# Patient Record
Sex: Female | Born: 1960
Health system: Southern US, Community
[De-identification: ages and names within clinical notes are randomized; demographics above are authoritative.]

## PROBLEM LIST (undated history)

## (undated) DIAGNOSIS — M1990 Unspecified osteoarthritis, unspecified site: Secondary | ICD-10-CM

## (undated) DIAGNOSIS — I10 Essential (primary) hypertension: Secondary | ICD-10-CM

## (undated) DIAGNOSIS — J4489 Other specified chronic obstructive pulmonary disease: Secondary | ICD-10-CM

## (undated) DIAGNOSIS — N3946 Mixed incontinence: Secondary | ICD-10-CM

## (undated) DIAGNOSIS — E781 Pure hyperglyceridemia: Secondary | ICD-10-CM

## (undated) DIAGNOSIS — E119 Type 2 diabetes mellitus without complications: Secondary | ICD-10-CM

## (undated) DIAGNOSIS — J449 Chronic obstructive pulmonary disease, unspecified: Secondary | ICD-10-CM

## (undated) DIAGNOSIS — R0902 Hypoxemia: Secondary | ICD-10-CM

## (undated) DIAGNOSIS — E785 Hyperlipidemia, unspecified: Secondary | ICD-10-CM

## (undated) DIAGNOSIS — T1491XA Suicide attempt, initial encounter: Secondary | ICD-10-CM

## (undated) DIAGNOSIS — F3110 Bipolar disorder, current episode manic without psychotic features, unspecified: Secondary | ICD-10-CM

## (undated) DIAGNOSIS — I2510 Atherosclerotic heart disease of native coronary artery without angina pectoris: Secondary | ICD-10-CM

## (undated) DIAGNOSIS — R9431 Abnormal electrocardiogram [ECG] [EKG]: Secondary | ICD-10-CM

## (undated) DIAGNOSIS — F32A Depression, unspecified: Secondary | ICD-10-CM

## (undated) DIAGNOSIS — G4730 Sleep apnea, unspecified: Secondary | ICD-10-CM

## (undated) DIAGNOSIS — Z8669 Personal history of other diseases of the nervous system and sense organs: Secondary | ICD-10-CM

## (undated) DIAGNOSIS — Z87448 Personal history of other diseases of urinary system: Secondary | ICD-10-CM

## (undated) DIAGNOSIS — I639 Cerebral infarction, unspecified: Secondary | ICD-10-CM

## (undated) DIAGNOSIS — M7989 Other specified soft tissue disorders: Secondary | ICD-10-CM

## (undated) DIAGNOSIS — I252 Old myocardial infarction: Secondary | ICD-10-CM

## (undated) DIAGNOSIS — G459 Transient cerebral ischemic attack, unspecified: Secondary | ICD-10-CM

## (undated) DIAGNOSIS — I5042 Chronic combined systolic (congestive) and diastolic (congestive) heart failure: Secondary | ICD-10-CM

## (undated) DIAGNOSIS — K859 Acute pancreatitis without necrosis or infection, unspecified: Secondary | ICD-10-CM

## (undated) DIAGNOSIS — E669 Obesity, unspecified: Secondary | ICD-10-CM

## (undated) DIAGNOSIS — F419 Anxiety disorder, unspecified: Secondary | ICD-10-CM

## (undated) DIAGNOSIS — M5116 Intervertebral disc disorders with radiculopathy, lumbar region: Secondary | ICD-10-CM

## (undated) DIAGNOSIS — F17200 Nicotine dependence, unspecified, uncomplicated: Secondary | ICD-10-CM

## (undated) DIAGNOSIS — D72829 Elevated white blood cell count, unspecified: Secondary | ICD-10-CM

## (undated) DIAGNOSIS — M25561 Pain in right knee: Secondary | ICD-10-CM

## (undated) HISTORY — DX: Abnormal electrocardiogram (ECG) (EKG): R94.31

## (undated) HISTORY — DX: Old myocardial infarction: I25.2

## (undated) HISTORY — DX: Chronic obstructive pulmonary disease, unspecified: J44.9

## (undated) HISTORY — DX: Pure hyperglyceridemia: E78.1

## (undated) HISTORY — DX: Suicide attempt, initial encounter: T14.91XA

## (undated) HISTORY — PX: TUBAL LIGATION: SHX77

## (undated) HISTORY — DX: Bipolar disorder, current episode manic without psychotic features, unspecified: F31.10

## (undated) HISTORY — DX: Acute pancreatitis without necrosis or infection, unspecified: K85.90

## (undated) HISTORY — DX: Hyperlipidemia, unspecified: E78.5

## (undated) HISTORY — DX: Personal history of other diseases of urinary system: Z87.448

## (undated) HISTORY — DX: Pain in right knee: M25.561

## (undated) HISTORY — DX: Obesity, unspecified: E66.9

## (undated) HISTORY — DX: Depression, unspecified: F32.A

## (undated) HISTORY — DX: Sleep apnea, unspecified: G47.30

## (undated) HISTORY — DX: Mixed incontinence: N39.46

## (undated) HISTORY — DX: Nicotine dependence, unspecified, uncomplicated: F17.200

## (undated) HISTORY — DX: Anxiety disorder, unspecified: F41.9

## (undated) HISTORY — DX: Other specified chronic obstructive pulmonary disease: J44.89

## (undated) HISTORY — DX: Type 2 diabetes mellitus without complications: E11.9

## (undated) HISTORY — DX: Intervertebral disc disorders with radiculopathy, lumbar region: M51.16

## (undated) HISTORY — DX: Other specified soft tissue disorders: M79.89

## (undated) HISTORY — PX: CHOLECYSTECTOMY: SHX55

## (undated) HISTORY — PX: JOINT REPLACEMENT: SHX530

## (undated) HISTORY — DX: Unspecified osteoarthritis, unspecified site: M19.90

## (undated) HISTORY — DX: Hypoxemia: R09.02

## (undated) HISTORY — DX: Elevated white blood cell count, unspecified: D72.829

## (undated) HISTORY — PX: OTHER SURGICAL HISTORY: SHX169

## (undated) HISTORY — DX: Atherosclerotic heart disease of native coronary artery without angina pectoris: I25.10

## (undated) HISTORY — DX: Personal history of other diseases of the nervous system and sense organs: Z86.69

## (undated) HISTORY — DX: Cerebral infarction, unspecified: I63.9

## (undated) HISTORY — DX: Essential (primary) hypertension: I10

## (undated) HISTORY — DX: Chronic combined systolic (congestive) and diastolic (congestive) heart failure: I50.42

## (undated) HISTORY — DX: Transient cerebral ischemic attack, unspecified: G45.9

## (undated) SURGERY — Surgical Case
Anesthesia: *Unknown

---

## 1982-07-02 HISTORY — PX: CHOLECYSTECTOMY: SHX55

## 1988-06-01 DIAGNOSIS — Z8719 Personal history of other diseases of the digestive system: Secondary | ICD-10-CM

## 1988-06-01 HISTORY — DX: Personal history of other diseases of the digestive system: Z87.19

## 1998-02-28 ENCOUNTER — Other Ambulatory Visit: Admission: RE | Admit: 1998-02-28 | Discharge: 1998-02-28 | Payer: Self-pay | Admitting: Obstetrics and Gynecology

## 2000-03-21 ENCOUNTER — Emergency Department (HOSPITAL_COMMUNITY): Admission: EM | Admit: 2000-03-21 | Discharge: 2000-03-22 | Payer: Self-pay | Admitting: Emergency Medicine

## 2000-06-09 ENCOUNTER — Encounter: Payer: Self-pay | Admitting: Obstetrics and Gynecology

## 2000-06-09 ENCOUNTER — Encounter: Admission: RE | Admit: 2000-06-09 | Discharge: 2000-06-09 | Payer: Self-pay | Admitting: *Deleted

## 2000-06-09 LAB — HM MAMMOGRAPHY: HM Mammogram: NORMAL

## 2000-09-07 ENCOUNTER — Other Ambulatory Visit: Admission: RE | Admit: 2000-09-07 | Discharge: 2000-09-07 | Payer: Self-pay | Admitting: Family Medicine

## 2000-09-07 ENCOUNTER — Encounter: Payer: Self-pay | Admitting: Family Medicine

## 2000-09-07 LAB — CONVERTED CEMR LAB: Pap Smear: NORMAL

## 2001-06-01 ENCOUNTER — Emergency Department (HOSPITAL_COMMUNITY): Admission: EM | Admit: 2001-06-01 | Discharge: 2001-06-01 | Payer: Self-pay | Admitting: Emergency Medicine

## 2001-06-01 DIAGNOSIS — T1491XA Suicide attempt, initial encounter: Secondary | ICD-10-CM

## 2001-06-01 HISTORY — DX: Suicide attempt, initial encounter: T14.91XA

## 2004-04-01 ENCOUNTER — Ambulatory Visit: Payer: Self-pay | Admitting: Internal Medicine

## 2004-08-29 ENCOUNTER — Ambulatory Visit: Payer: Self-pay | Admitting: Family Medicine

## 2004-11-05 ENCOUNTER — Ambulatory Visit: Payer: Self-pay | Admitting: Family Medicine

## 2005-06-04 ENCOUNTER — Ambulatory Visit: Payer: Self-pay | Admitting: Family Medicine

## 2005-06-06 ENCOUNTER — Ambulatory Visit: Payer: Self-pay | Admitting: Family Medicine

## 2005-06-08 ENCOUNTER — Ambulatory Visit: Payer: Self-pay | Admitting: Family Medicine

## 2005-06-12 ENCOUNTER — Ambulatory Visit: Payer: Self-pay | Admitting: Family Medicine

## 2006-01-11 ENCOUNTER — Encounter: Payer: Self-pay | Admitting: Family Medicine

## 2006-01-11 ENCOUNTER — Emergency Department (HOSPITAL_COMMUNITY): Admission: EM | Admit: 2006-01-11 | Discharge: 2006-01-11 | Payer: Self-pay | Admitting: Emergency Medicine

## 2006-01-13 ENCOUNTER — Ambulatory Visit: Payer: Self-pay | Admitting: Family Medicine

## 2006-01-14 ENCOUNTER — Inpatient Hospital Stay (HOSPITAL_COMMUNITY): Admission: AD | Admit: 2006-01-14 | Discharge: 2006-01-15 | Payer: Self-pay | Admitting: Internal Medicine

## 2006-01-14 ENCOUNTER — Ambulatory Visit: Payer: Self-pay | Admitting: Internal Medicine

## 2006-01-15 HISTORY — PX: OTHER SURGICAL HISTORY: SHX169

## 2006-01-19 ENCOUNTER — Ambulatory Visit: Payer: Self-pay | Admitting: Gastroenterology

## 2006-01-26 ENCOUNTER — Ambulatory Visit: Payer: Self-pay | Admitting: Internal Medicine

## 2006-02-22 ENCOUNTER — Ambulatory Visit: Payer: Self-pay | Admitting: Family Medicine

## 2006-02-22 ENCOUNTER — Encounter: Admission: RE | Admit: 2006-02-22 | Discharge: 2006-02-22 | Payer: Self-pay | Admitting: Family Medicine

## 2006-02-27 ENCOUNTER — Encounter: Admission: RE | Admit: 2006-02-27 | Discharge: 2006-02-27 | Payer: Self-pay | Admitting: Family Medicine

## 2006-03-08 ENCOUNTER — Ambulatory Visit: Payer: Self-pay | Admitting: Family Medicine

## 2006-03-15 ENCOUNTER — Encounter: Admission: RE | Admit: 2006-03-15 | Discharge: 2006-04-06 | Payer: Self-pay | Admitting: Neurosurgery

## 2006-06-08 ENCOUNTER — Ambulatory Visit: Payer: Self-pay | Admitting: Family Medicine

## 2006-06-10 ENCOUNTER — Ambulatory Visit: Payer: Self-pay | Admitting: Family Medicine

## 2006-06-11 ENCOUNTER — Inpatient Hospital Stay (HOSPITAL_COMMUNITY): Admission: AD | Admit: 2006-06-11 | Discharge: 2006-06-18 | Payer: Self-pay | Admitting: Internal Medicine

## 2006-06-11 ENCOUNTER — Ambulatory Visit: Payer: Self-pay | Admitting: Internal Medicine

## 2006-06-11 ENCOUNTER — Ambulatory Visit: Payer: Self-pay | Admitting: Family Medicine

## 2006-06-11 ENCOUNTER — Ambulatory Visit: Payer: Self-pay | Admitting: Pulmonary Disease

## 2006-06-14 HISTORY — PX: OTHER SURGICAL HISTORY: SHX169

## 2006-06-14 HISTORY — PX: CT MAXILLOFACIAL WO/W CM: HXRAD844

## 2006-06-16 ENCOUNTER — Encounter: Payer: Self-pay | Admitting: Family Medicine

## 2006-06-23 ENCOUNTER — Ambulatory Visit: Payer: Self-pay | Admitting: Family Medicine

## 2006-07-29 ENCOUNTER — Ambulatory Visit: Payer: Self-pay | Admitting: Critical Care Medicine

## 2006-09-08 ENCOUNTER — Ambulatory Visit: Payer: Self-pay | Admitting: Critical Care Medicine

## 2007-04-08 ENCOUNTER — Encounter: Payer: Self-pay | Admitting: Family Medicine

## 2007-05-04 ENCOUNTER — Ambulatory Visit: Payer: Self-pay | Admitting: Family Medicine

## 2007-05-04 DIAGNOSIS — M7989 Other specified soft tissue disorders: Secondary | ICD-10-CM | POA: Insufficient documentation

## 2007-05-04 DIAGNOSIS — M25569 Pain in unspecified knee: Secondary | ICD-10-CM | POA: Insufficient documentation

## 2007-06-01 ENCOUNTER — Encounter: Payer: Self-pay | Admitting: Family Medicine

## 2007-06-01 DIAGNOSIS — Z6841 Body Mass Index (BMI) 40.0 and over, adult: Secondary | ICD-10-CM | POA: Insufficient documentation

## 2007-06-01 DIAGNOSIS — J45909 Unspecified asthma, uncomplicated: Secondary | ICD-10-CM | POA: Insufficient documentation

## 2007-06-01 DIAGNOSIS — Z87891 Personal history of nicotine dependence: Secondary | ICD-10-CM | POA: Insufficient documentation

## 2007-06-01 DIAGNOSIS — F319 Bipolar disorder, unspecified: Secondary | ICD-10-CM | POA: Insufficient documentation

## 2007-06-01 DIAGNOSIS — F17210 Nicotine dependence, cigarettes, uncomplicated: Secondary | ICD-10-CM | POA: Insufficient documentation

## 2007-06-01 DIAGNOSIS — F4322 Adjustment disorder with anxiety: Secondary | ICD-10-CM

## 2007-06-01 DIAGNOSIS — E781 Pure hyperglyceridemia: Secondary | ICD-10-CM | POA: Insufficient documentation

## 2007-06-01 DIAGNOSIS — K859 Acute pancreatitis without necrosis or infection, unspecified: Secondary | ICD-10-CM | POA: Insufficient documentation

## 2007-06-01 DIAGNOSIS — F39 Unspecified mood [affective] disorder: Secondary | ICD-10-CM | POA: Insufficient documentation

## 2007-06-23 ENCOUNTER — Ambulatory Visit: Payer: Self-pay | Admitting: Family Medicine

## 2007-06-23 DIAGNOSIS — I10 Essential (primary) hypertension: Secondary | ICD-10-CM | POA: Insufficient documentation

## 2007-06-23 LAB — CONVERTED CEMR LAB
Bilirubin Urine: NEGATIVE
Blood in Urine, dipstick: NEGATIVE
Glucose, Urine, Semiquant: NEGATIVE
Ketones, urine, test strip: NEGATIVE
Protein, U semiquant: NEGATIVE
Specific Gravity, Urine: 1.015
Urobilinogen, UA: 0.2
pH: 6

## 2007-06-24 ENCOUNTER — Encounter: Payer: Self-pay | Admitting: Family Medicine

## 2007-07-08 ENCOUNTER — Encounter: Payer: Self-pay | Admitting: Family Medicine

## 2007-07-26 ENCOUNTER — Ambulatory Visit: Payer: Self-pay | Admitting: Family Medicine

## 2008-01-06 ENCOUNTER — Encounter: Payer: Self-pay | Admitting: Family Medicine

## 2008-02-08 ENCOUNTER — Encounter: Payer: Self-pay | Admitting: Family Medicine

## 2008-02-21 ENCOUNTER — Encounter: Payer: Self-pay | Admitting: Family Medicine

## 2008-02-21 ENCOUNTER — Ambulatory Visit (HOSPITAL_COMMUNITY): Admission: RE | Admit: 2008-02-21 | Discharge: 2008-02-21 | Payer: Self-pay | Admitting: Orthopedic Surgery

## 2008-02-22 ENCOUNTER — Ambulatory Visit: Payer: Self-pay | Admitting: Family Medicine

## 2008-02-22 DIAGNOSIS — D72829 Elevated white blood cell count, unspecified: Secondary | ICD-10-CM | POA: Insufficient documentation

## 2008-02-22 DIAGNOSIS — R9431 Abnormal electrocardiogram [ECG] [EKG]: Secondary | ICD-10-CM | POA: Insufficient documentation

## 2008-02-22 LAB — CONVERTED CEMR LAB
Bacteria, UA: 0
Bilirubin Urine: NEGATIVE
Blood in Urine, dipstick: NEGATIVE
Glucose, Urine, Semiquant: NEGATIVE
Ketones, urine, test strip: NEGATIVE
Nitrite: NEGATIVE
Protein, U semiquant: NEGATIVE
RBC / HPF: 0
Specific Gravity, Urine: 1.01
Urobilinogen, UA: 0.2
WBC Urine, dipstick: NEGATIVE
pH: 6

## 2008-02-23 ENCOUNTER — Encounter: Payer: Self-pay | Admitting: Family Medicine

## 2008-02-27 ENCOUNTER — Telehealth: Payer: Self-pay | Admitting: Family Medicine

## 2008-02-27 ENCOUNTER — Ambulatory Visit: Payer: Self-pay | Admitting: Critical Care Medicine

## 2008-02-27 DIAGNOSIS — J45909 Unspecified asthma, uncomplicated: Secondary | ICD-10-CM | POA: Insufficient documentation

## 2008-02-27 LAB — CONVERTED CEMR LAB
Basophils Absolute: 0.1 10*3/uL (ref 0.0–0.1)
Basophils Relative: 0 % (ref 0–1)
Eosinophils Absolute: 0.3 10*3/uL (ref 0.0–0.7)
Eosinophils Relative: 2 % (ref 0–5)
HCT: 43.6 % (ref 36.0–46.0)
Hemoglobin: 14.5 g/dL (ref 12.0–15.0)
Lymphocytes Relative: 22 % (ref 12–46)
Lymphs Abs: 3.6 10*3/uL (ref 0.7–4.0)
MCHC: 33.3 g/dL (ref 30.0–36.0)
MCV: 93.6 fL (ref 78.0–100.0)
Monocytes Absolute: 1.1 10*3/uL — ABNORMAL HIGH (ref 0.1–1.0)
Monocytes Relative: 7 % (ref 3–12)
Neutro Abs: 11 10*3/uL — ABNORMAL HIGH (ref 1.7–7.7)
Neutrophils Relative %: 69 % (ref 43–77)
Platelets: 315 10*3/uL (ref 150–400)
RBC: 4.66 M/uL (ref 3.87–5.11)
RDW: 13.3 % (ref 11.5–15.5)
WBC: 16 10*3/uL — ABNORMAL HIGH (ref 4.0–10.5)

## 2008-02-28 ENCOUNTER — Ambulatory Visit: Payer: Self-pay | Admitting: Family Medicine

## 2008-02-28 DIAGNOSIS — K047 Periapical abscess without sinus: Secondary | ICD-10-CM | POA: Insufficient documentation

## 2008-03-19 ENCOUNTER — Telehealth (INDEPENDENT_AMBULATORY_CARE_PROVIDER_SITE_OTHER): Payer: Self-pay | Admitting: *Deleted

## 2008-03-22 ENCOUNTER — Ambulatory Visit: Payer: Self-pay | Admitting: Internal Medicine

## 2008-03-22 ENCOUNTER — Inpatient Hospital Stay (HOSPITAL_COMMUNITY): Admission: RE | Admit: 2008-03-22 | Discharge: 2008-03-27 | Payer: Self-pay | Admitting: Orthopedic Surgery

## 2008-04-23 ENCOUNTER — Encounter: Admission: RE | Admit: 2008-04-23 | Discharge: 2008-05-23 | Payer: Self-pay | Admitting: Orthopedic Surgery

## 2008-07-13 ENCOUNTER — Encounter: Payer: Self-pay | Admitting: Family Medicine

## 2008-07-30 ENCOUNTER — Ambulatory Visit: Payer: Self-pay | Admitting: Family Medicine

## 2008-07-30 ENCOUNTER — Telehealth (INDEPENDENT_AMBULATORY_CARE_PROVIDER_SITE_OTHER): Payer: Self-pay | Admitting: Internal Medicine

## 2008-07-30 DIAGNOSIS — J441 Chronic obstructive pulmonary disease with (acute) exacerbation: Secondary | ICD-10-CM | POA: Insufficient documentation

## 2008-11-30 ENCOUNTER — Encounter: Payer: Self-pay | Admitting: Family Medicine

## 2008-12-19 ENCOUNTER — Telehealth (INDEPENDENT_AMBULATORY_CARE_PROVIDER_SITE_OTHER): Payer: Self-pay | Admitting: Internal Medicine

## 2008-12-27 ENCOUNTER — Ambulatory Visit: Payer: Self-pay | Admitting: Family Medicine

## 2009-02-07 ENCOUNTER — Encounter: Payer: Self-pay | Admitting: Family Medicine

## 2009-03-01 ENCOUNTER — Encounter: Payer: Self-pay | Admitting: Family Medicine

## 2009-05-29 ENCOUNTER — Telehealth: Payer: Self-pay | Admitting: Family Medicine

## 2009-09-09 ENCOUNTER — Ambulatory Visit: Payer: Self-pay | Admitting: Family Medicine

## 2009-10-14 ENCOUNTER — Encounter: Payer: Self-pay | Admitting: Family Medicine

## 2009-10-17 ENCOUNTER — Ambulatory Visit: Payer: Self-pay | Admitting: Family Medicine

## 2010-01-07 ENCOUNTER — Encounter (INDEPENDENT_AMBULATORY_CARE_PROVIDER_SITE_OTHER): Payer: Self-pay | Admitting: *Deleted

## 2010-01-24 ENCOUNTER — Encounter: Payer: Self-pay | Admitting: Family Medicine

## 2010-02-24 ENCOUNTER — Telehealth: Payer: Self-pay | Admitting: Family Medicine

## 2010-02-24 ENCOUNTER — Emergency Department (HOSPITAL_COMMUNITY): Admission: EM | Admit: 2010-02-24 | Discharge: 2010-02-24 | Payer: Self-pay | Admitting: Emergency Medicine

## 2010-03-10 ENCOUNTER — Encounter: Payer: Self-pay | Admitting: Family Medicine

## 2010-03-10 ENCOUNTER — Ambulatory Visit: Payer: Self-pay | Admitting: Internal Medicine

## 2010-03-12 ENCOUNTER — Ambulatory Visit: Payer: Self-pay | Admitting: Family Medicine

## 2010-03-12 ENCOUNTER — Ambulatory Visit: Payer: Self-pay | Admitting: Internal Medicine

## 2010-03-13 ENCOUNTER — Ambulatory Visit: Payer: Self-pay | Admitting: Internal Medicine

## 2010-03-13 ENCOUNTER — Encounter: Payer: Self-pay | Admitting: Family Medicine

## 2010-03-14 ENCOUNTER — Telehealth: Payer: Self-pay | Admitting: Family Medicine

## 2010-05-08 ENCOUNTER — Ambulatory Visit: Payer: Self-pay | Admitting: Internal Medicine

## 2010-06-19 IMAGING — CR DG CHEST 2V
2 series · 2 of 2 positions shown · non-contrast
Comparison: 06/15/2006

CLINICAL DATA: Preoperative respiratory exam.  Unstable right knee.

CHEST - 2 VIEW

[view not recorded (1 of 2)]
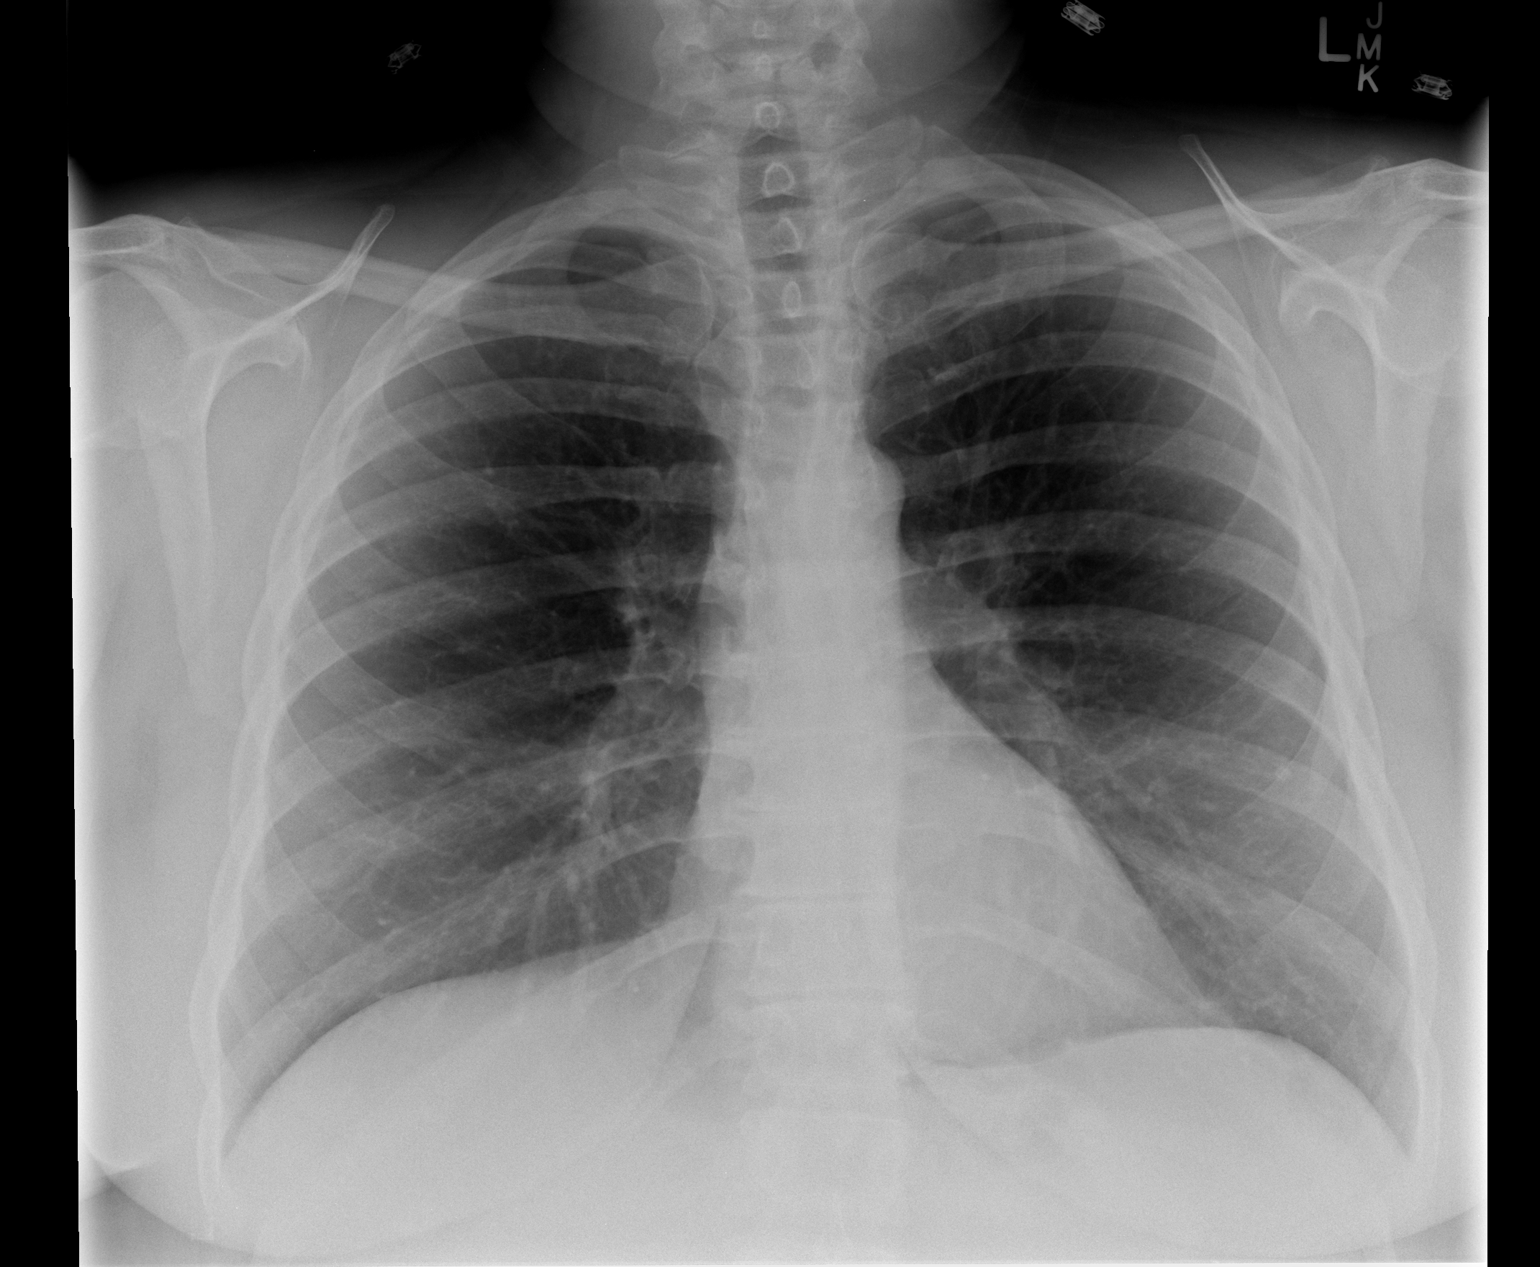

[view not recorded (2 of 2)]
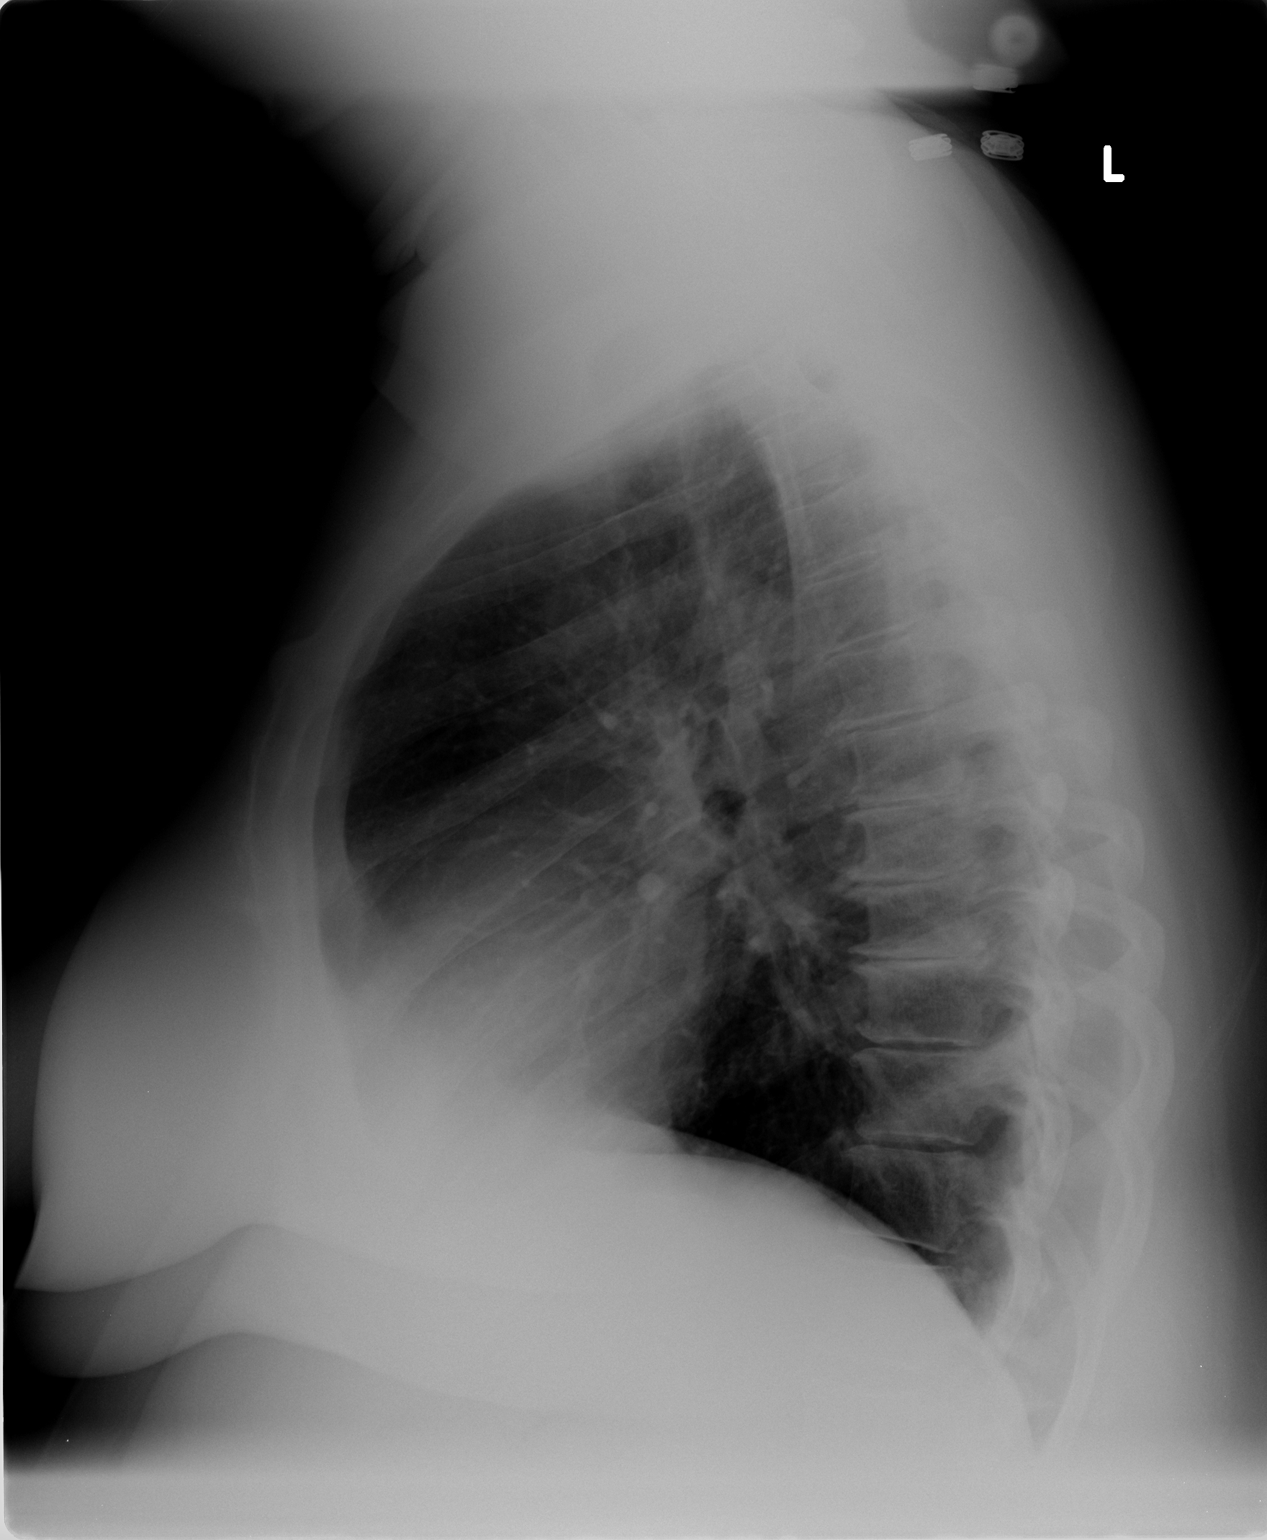

[2 of 2 positions shown; findings below may reference images not displayed]

FINDINGS: The heart size and pulmonary vascularity are normal and
the lungs are clear except for a tiny calcified granuloma at the
left lung base.  No significant bony abnormalities.
IMPRESSION: No acute disease in the chest.

## 2010-06-22 ENCOUNTER — Encounter: Payer: Self-pay | Admitting: Gastroenterology

## 2010-07-01 NOTE — Letter (Signed)
Summary: [facility]  [facility]   Imported By: Lanelle Bal 12/17/2008 11:42:13  _____________________________________________________________________  External Attachment:    Type:   Image     Comment:   External Document

## 2010-07-01 NOTE — Letter (Signed)
Summary: Out of Work  [employer] [facility] at [facility]  [address] [place], [place] [address]   Phone: [phone]  Fax: [phone]    March 12, 2010   Employee:  BRYNNLY BONET    To Whom It May Concern:   For Medical reasons, please excuse the above named employee from work for the following dates:  Start:   03/12/10  End:   03/12/10  If you need additional information, please feel free to contact our office.         Sincerely,    Lewanda Rife LPN

## 2010-07-01 NOTE — Assessment & Plan Note (Signed)
Summary: WHEEZING,S.O.B./CLE   Vital Signs:  Patient profile:   50 year old female Weight:      233.50 pounds O2 Sat:      97 % on Room air Temp:     98.2 degrees F oral Pulse rate:   76 / minute Pulse rhythm:   regular Resp:     20 per minute BP sitting:   112 / 74  (left arm) Cuff size:   large  Vitals Entered By: Sydell Axon LPN (September 09, 2009 10:55 AM)  O2 Flow:  Room air CC: Productive cough/ clear-yellow, some wheezing and SOB   History of Present Illness: Pt here for wheezing with ripping, burning pain in her side when she coughs...she hurts in the left upper Latissimus Dorsi area. It hurts when coughing, doubled her over Sat AM. She has not had feveror chills, has headache now in the porsterior portion of the neck, no ear pain, occas pop with coughing, she feels facially full, some rhinitis that is clear, no ST, no N/V, cough productive clear sputum that is copious, is wheezing and SOB, can get air into her lings but can't get it out. She has to use her stomach muscles to expire her air. She is still smoking and is predictably out of most of her medications. Ids using Proair, no antihistamines and not using symbicort.   Problems Prior to Update: 1)  Chronic Obstructive Pulmonary Disease, Acute Exacerbation  (ICD-491.21) 2)  Abscess, Tooth  (ICD-522.5) 3)  Bronchitis, Obstructive Chronic  (ICD-491.20) 4)  Nonspecific Abnormal Electrocardiogram  (ICD-794.31) 5)  Leukocytosis Unspecified  (ICD-288.60) 6)  Uri  (ICD-465.9) 7)  Hypertension, Benign Essential  (ICD-401.1) 8)  Hx of Back Pain (LUMBAR SPINE DZ- [facility])  (ICD-724.5) 9)  Migraines, Hx of  (ICD-V13.8) 10)  Bipolar Affective Disorder, Manic  (ICD-296.40) 11)  Hypertriglyceridemia  (ICD-272.1) 12)  Hx of Pancreatitis  (ICD-577.0) 13)  Emphysema  (ICD-492.8) 14)  Nicotine Addiction  (ICD-305.1) 15)  Reactive Airway Disease  (ICD-493.90) 16)  Hypertension  (ICD-401.9) 17)  Obesity  (ICD-278.00) 18)  Swelling  of Limb  (ICD-729.81) 19)  Knee Pain, Right  (ICD-719.46)  Medications Prior to Update: 1)  Gabapentin 600 Mg Tabs (Gabapentin) .Marland Kitchen.. 1 By Mouth Three Times A Day 2)  Lisinopril 2.5 Mg  Tabs (Lisinopril) .... Oine Tab By Mouth At Night. 3)  Flexeril 10 Mg  Tabs (Cyclobenzaprine Hcl) .Marland Kitchen.. 1 Three Times A Day As Needed / By Mouth 4)  Darvocet-N 100 100-650 Mg Tabs (Propoxyphene N-Apap) .... As Needed 5)  Proair Hfa 108 (90 Base) Mcg/act  Aers (Albuterol Sulfate) .... 2 Puffs Every  4 Hrs As Needed Wheezing 6)  Diclofenac Sodium 75 Mg  Tbec (Diclofenac Sodium) .Marland Kitchen.. 1 Two Times A Day // As Needed By Mouth 7)  Xopenex 0.63 Mg/32ml  Nebu (Levalbuterol Hcl) .... Use in Nebulizer As Directed As Needed Qid Max 8)  Hydrochlorothiazide 25 Mg  Tabs (Hydrochlorothiazide) .Marland Kitchen.. 1 Daily By Mouth For Swelling 9)  Zithromax Z-Pak 250 Mg Tabs (Azithromycin) .... As Dir 10)  Symbicort 80-4.5 Mcg/act Aero (Budesonide-Formoterol Fumarate) .... One Inh Two Times A Day  Allergies: 1)  ! * Zaroxyline 2)  ! Elio Forget  Physical Exam  General:  Well-developed,well-nourished; alert,appropriate and cooperative throughout examination, congested and audibly wheezing Head:  Normocephalic and atraumatic without obvious abnormalities. No apparent alopecia or balding. Sinuses tender maxillaries. Eyes:  Conjunctiva clear bilaterally. Allergic shiners. Ears:  clear fluid B TMs, dull LR of  TMs. Nose:  External nasal examination shows no deformity or inflammation. Nasal mucosa are pink and moist without lesions or exudates. Mouth:  Oral mucosa and oropharynx without lesions or exudates.  Teeth in good repair. Thick clear PND. Neck:  No deformities, masses, or tenderness noted. Lungs:  normal respiratory effort, no intercostal retractions, and no accessory muscle use.  Diffuse expiratory wheeze and prolongued exp phase. Hesitaes to take deep breath, splints left lat chestwall with cough due to knife-like pain. Heart:  Normal  rate and regular rhythm. S1 and S2 normal without gallop, murmur, click, rub or other extra sounds.   Impression & Recommendations:  Problem # 1:  BRONCHITIS- ACUTE (ICD-466.0) Assessment New  Recurrent. See instructions. Take Zithromax in addition. The following medications were removed from the medication list:    Xopenex 0.63 Mg/20ml Nebu (Levalbuterol hcl) ..... Use in nebulizer as directed as needed qid max    Zithromax Z-pak 250 Mg Tabs (Azithromycin) .Marland Kitchen... As dir Her updated medication list for this problem includes:    Proair Hfa 108 (90 Base) Mcg/act Aers (Albuterol sulfate) .Marland Kitchen... 2 puffs every  4 hrs as needed wheezing    Symbicort 80-4.5 Mcg/act Aero (Budesonide-formoterol fumarate) ..... One inh two times a day    Zithromax Z-pak 250 Mg Tabs (Azithromycin) .Marland Kitchen... As dir    Xopenex 0.63 Mg/33ml Nebu (Levalbuterol hcl) ..... Use in nebuliozer qid as needed for restricted breathing.  Take antibiotics and other medications as directed. Encouraged to push clear liquids, get enough rest, and take acetaminophen as needed. To be seen in 5-7 days if no improvement, sooner if worse.  Problem # 2:  CHRONIC OBSTRUCTIVE PULMONARY DISEASE, ACUTE EXACERBATION (ICD-491.21) Assessment: Deteriorated Again really needs to stop smoking. Had quit 4 mos for ortho surgery, then started back when went back to work.  Problem # 3:  CHEST PAIN, NON-CARDIAC, MUSCLE PULL (ICD-786.59) Assessment: New Use heat and ice as discussed. Use Flexeril. Will take time. Splint chest with cough, sneeze.  Problem # 4:  HYPERTENSION, BENIGN ESSENTIAL (ICD-401.1) Assessment: Unchanged Amazingly stable. Her updated medication list for this problem includes:    Lisinopril 2.5 Mg Tabs (Lisinopril) ..... Oine tab by mouth at night.    Hydrochlorothiazide 25 Mg Tabs (Hydrochlorothiazide) .Marland Kitchen... 1 daily by mouth for swelling  BP today: 112/74 Prior BP: 120/80 (12/27/2008)  Complete Medication List: 1)  Gabapentin  600 Mg Tabs (Gabapentin) .Marland Kitchen.. 1 by mouth three times a day 2)  Lisinopril 2.5 Mg Tabs (Lisinopril) .... Oine tab by mouth at night. 3)  Flexeril 10 Mg Tabs (Cyclobenzaprine hcl) .Marland Kitchen.. 1 three times a day as needed / by mouth 4)  Proair Hfa 108 (90 Base) Mcg/act Aers (Albuterol sulfate) .... 2 puffs every  4 hrs as needed wheezing 5)  Diclofenac Sodium 75 Mg Tbec (Diclofenac sodium) .Marland Kitchen.. 1 two times a day // as needed by mouth 6)  Hydrochlorothiazide 25 Mg Tabs (Hydrochlorothiazide) .Marland Kitchen.. 1 daily by mouth for swelling 7)  Symbicort 80-4.5 Mcg/act Aero (Budesonide-formoterol fumarate) .... One inh two times a day 8)  Zithromax Z-pak 250 Mg Tabs (Azithromycin) .... As dir 9)  Xopenex 0.63 Mg/29ml Nebu (Levalbuterol hcl) .... Use in nebuliozer qid as needed for restricted breathing. 10)  Flexeril 10 Mg Tabs (Cyclobenzaprine hcl) .... One tab three times a day as needed muscle spasm.  Patient Instructions: 1)  Take Zyrtec 10mg  daily at night. Get back on Symbicort.  2)  Use Xopenex as needed. 3)  Take Guaifenesin by going to  [facility] or [facility] and getting MUCOUS RELIEF CONGESTENT (400mg ), take 11/2 tabs by mouth AM and NOON. 4)  Drink lots of fluids anytime taking Guaifenesin.  5)  Use Neti pot, then Vicks at night vaseline during day.  6)  Use Flexeril as needed for muscle spasm. 7)  Quit smoking. Prescriptions: FLEXERIL 10 MG TABS (CYCLOBENZAPRINE HCL) one tab three times a day as needed muscle spasm.  #30 x 0   Entered and Authorized by:   Shaune Leeks MD   Signed by:   Shaune Leeks MD on 09/09/2009   Method used:   Electronically to        [facility] [phone]* (retail)       [address]       [place], [place]  [address]       Ph: [phone]       Fax: [phone]   RxID:   [id number] XOPENEX 0.63 MG/3ML NEBU (LEVALBUTEROL HCL) use in nebuliozer qid as needed for restricted breathing.  #1 box x 3   Entered and Authorized by:   Shaune Leeks MD   Signed by:   Shaune Leeks MD on 09/09/2009   Method used:   Electronically to        [facility] [phone]* (retail)       [address]       [place], [place]  [address]       Ph: [phone]       Fax: [phone]   RxID:   [id number] ZITHROMAX Z-PAK 250 MG TABS (AZITHROMYCIN) as dir  #1 pak x 0   Entered and Authorized by:   Shaune Leeks MD   Signed by:   Shaune Leeks MD on 09/09/2009   Method used:   Electronically to        [facility] [phone]* (retail)       [address]       [place], [place]  [address]       Ph: [phone]       Fax: [phone]   RxID:   [id number] SYMBICORT 80-4.5 MCG/ACT AERO (BUDESONIDE-FORMOTEROL FUMARATE) one inh two times a day  #1 MDI x 12   Entered and Authorized by:   Shaune Leeks MD   Signed by:   Shaune Leeks MD on 09/09/2009   Method used:   Electronically to        [facility]  [address] (retail)       [address]       [place], [place]  [address]       Ph: [phone]       Fax: [phone]   RxID:   [id number]   Current Allergies (reviewed today): ! * ZAROXYLINE ! * Elio Forget

## 2010-07-01 NOTE — Assessment & Plan Note (Signed)
Summary: SINUS INFECTION? / LFW   Vital Signs:  Patient Profile:   50 Years Old Female Height:     62 inches Weight:      234.25 pounds Temp:     98.2 degrees F oral Pulse rate:   76 / minute Pulse rhythm:   regular BP sitting:   122 / 80  (left arm) Cuff size:   large  Vitals Entered By: Delilah Shan (July 30, 2008 4:48 PM)                 Chief Complaint:  ? sinus infection.  Acute Visit History:      The patient complains of cough, nasal discharge, sinus problems, and sore throat.  These symptoms began 2 days ago.  She denies fever.  Other comments include: post nasal drip.        The patient notes wheezing and shortness of breath.  The character of the cough is described as nonproductive.        She complains of sinus pressure, ears being blocked, nasal congestion, and purulent drainage.          Current Allergies (reviewed today): ! * ZAROXYLINE  Past Medical History:    Reviewed history from 02/27/2008 and no changes required:       Current Problems:        BRONCHITIS, OBSTRUCTIVE CHRONIC (ICD-491.20)            -FeV1 64%  TLC 105%   DLCO  55% 2008             -FeV1 81% FeF 25-75 43%  2009       NONSPECIFIC ABNORMAL ELECTROCARDIOGRAM (ICD-794.31)       LEUKOCYTOSIS UNSPECIFIED (ICD-288.60)       URI (ICD-465.9)       HYPERTENSION, BENIGN ESSENTIAL (ICD-401.1)       Hx of BACK PAIN (LUMBAR SPINE DZ- DR [facility]) (ICD-724.5)       MIGRAINES, HX OF (ICD-V13.8)       BIPOLAR AFFECTIVE DISORDER, MANIC (ICD-296.40)       HYPERTRIGLYCERIDEMIA (ICD-272.1)       Hx of PANCREATITIS (ICD-577.0)       EMPHYSEMA (ICD-492.8)       NICOTINE ADDICTION (ICD-305.1)       REACTIVE AIRWAY DISEASE (ICD-493.90)       HYPERTENSION (ICD-401.9)       OBESITY (ICD-278.00)       SWELLING OF LIMB (ICD-729.81)       KNEE PAIN, RIGHT (ICD-719.46)          Social History:    Reviewed history from 06/01/2007 and no changes required:       Marital Status: Married       Children: 3       Occupation:     Review of Systems      See HPI   Physical Exam  General:     fatigued appearing female in NAD Head:     no maxiary sinus ttp Ears:     clear fluid B TMs Nose:     External nasal examination shows no deformity or inflammation. Nasal mucosa are pink and moist without lesions or exudates. Mouth:     Oral mucosa and oropharynx without lesions or exudates.  Teeth in good repair. Lungs:     normal respiratory effort, no intercostal retractions, and no accessory muscle use.  Diffuse expiratory wheeze and prolongued exp phase.  Heart:     Normal rate and  regular rhythm. S1 and S2 normal without gallop, murmur, click, rub or other extra sounds.    Impression & Recommendations:  Problem # 1:  CHRONIC OBSTRUCTIVE PULMONARY DISEASE, ACUTE EXACERBATION (ICD-491.21) Given green mucus change with COPD, will cover with antibiotic. Treat with prednisone taper. Use albuterol as needed.Follow up if not improving.  QUIT SMOKING.   Complete Medication List: 1)  Gabapentin 600 Mg Tabs (Gabapentin) .Marland Kitchen.. 1 by mouth three times a day 2)  Lisinopril 2.5 Mg Tabs (Lisinopril) .... Oine tab by mouth at night. 3)  Flexeril 10 Mg Tabs (Cyclobenzaprine hcl) .Marland Kitchen.. 1 three times a day as needed / by mouth 4)  Darvocet-n 100 100-650 Mg Tabs (Propoxyphene n-apap) .... As needed 5)  Proair Hfa 108 (90 Base) Mcg/act Aers (Albuterol sulfate) .... 2 puffs every  4 hrs as needed wheezing 6)  Diclofenac Sodium 75 Mg Tbec (Diclofenac sodium) .Marland Kitchen.. 1 two times a day // as needed by mouth 7)  Xopenex 0.63 Mg/50ml Nebu (Levalbuterol hcl) .... Use in nebulizer as directed as needed qid max 8)  Hydrochlorothiazide 25 Mg Tabs (Hydrochlorothiazide) .Marland Kitchen.. 1 daily by mouth for swelling 9)  Fluticasone Propionate 50 Mcg/act Susp (Fluticasone propionate) .... Two puffs each nostril daily 10)  Azithromycin 250 Mg Tabs (Azithromycin) .... 2 tab by mouth x 1 day, then 1 tab by mouth daily 11)  Prednisone 20 Mg  Tabs (Prednisone) .... 3 tabs by mouth daily x 3 days, then 2 tabs by mouth daily x 2 days then 1 tab by mouth daily x 2 days     Prescriptions: PROAIR HFA 108 (90 BASE) MCG/ACT  AERS (ALBUTEROL SULFATE) 2 puffs every  4 hrs as needed wheezing  #1 x 0   Entered and Authorized by:   Kerby Nora MD   Signed by:   Kerby Nora MD on 07/30/2008   Method used:   Electronically to        [facility] [address] [phone]* (retail)       [address]       [place], [place]  [address]       Ph: [phone]       Fax: [phone]   RxID:   [id number] PREDNISONE 20 MG TABS (PREDNISONE) 3 tabs by mouth daily x 3 days, then 2 tabs by mouth daily x 2 days then 1 tab by mouth daily x 2 days  #15 x 0   Entered and Authorized by:   Kerby Nora MD   Signed by:   Kerby Nora MD on 07/30/2008   Method used:   Electronically to        [facility] [address] [phone]* (retail)       [address]       [place], [place]  [address]       Ph: [phone]       Fax: [phone]   RxID:   [id number] AZITHROMYCIN 250 MG TABS (AZITHROMYCIN) 2 tab by mouth x 1 day, then 1 tab by mouth daily  #6 x 0   Entered and Authorized by:   Kerby Nora MD   Signed by:   Kerby Nora MD on 07/30/2008   Method used:   Electronically to        [facility] [phone]* (retail)       [address]       [place], [place]  [address]       Ph: [phone]       Fax: [phone]  RxID:   [id number]   Current Allergies (reviewed today): ! * ZAROXYLINE Current Medications (including changes made in today's visit):  GABAPENTIN 600 MG TABS (GABAPENTIN) 1 by mouth three times a day LISINOPRIL 2.5 MG  TABS (LISINOPRIL) oine tab by mouth at night. FLEXERIL 10 MG  TABS (CYCLOBENZAPRINE HCL) 1 three times a day as needed / by mouth DARVOCET-N 100 100-650 MG TABS (PROPOXYPHENE N-APAP) as needed PROAIR HFA 108 (90 BASE) MCG/ACT  AERS (ALBUTEROL SULFATE) 2 puffs every  4 hrs as needed wheezing DICLOFENAC SODIUM 75 MG  TBEC  (DICLOFENAC SODIUM) 1 two times a day // as needed by mouth XOPENEX 0.63 MG/3ML  NEBU (LEVALBUTEROL HCL) use in nebulizer as directed as needed qid max HYDROCHLOROTHIAZIDE 25 MG  TABS (HYDROCHLOROTHIAZIDE) 1 DAILY BY MOUTH FOR SWELLING FLUTICASONE PROPIONATE 50 MCG/ACT SUSP (FLUTICASONE PROPIONATE) Two puffs each nostril daily AZITHROMYCIN 250 MG TABS (AZITHROMYCIN) 2 tab by mouth x 1 day, then 1 tab by mouth daily PREDNISONE 20 MG TABS (PREDNISONE) 3 tabs by mouth daily x 3 days, then 2 tabs by mouth daily x 2 days then 1 tab by mouth daily x 2 days

## 2010-07-01 NOTE — Letter (Signed)
Summary: [facility]  [facility]   Imported By: Sherian Rein 02/14/2010 11:46:30  _____________________________________________________________________  External Attachment:    Type:   Image     Comment:   External Document  Appended Document: [facility]     Clinical Lists Changes

## 2010-07-01 NOTE — Assessment & Plan Note (Signed)
Summary: EVALUATE EKG / LFW.   Vital Signs:  Patient Profile:   50 Years Old Female Weight:      231 pounds Temp:     98 degrees F oral Pulse rate:   88 / minute Pulse rhythm:   regular BP sitting:   140 / 90  (left arm) Cuff size:   large  Vitals Entered By: Providence Crosby (February 22, 2008 3:51 PM)                 Chief Complaint:  review ekg and blood work.  History of Present Illness: Pt here for Preop EKG read as prior anterior infarct, age undetermined and white count being elevated.  Pt came in this afternoon to be seen without referral or paperwork. She was tearful and upset due to learning her right knee surgery for tommorrow had been cancelled. She did not know specifics but we were able to have records faxed from Dr Audrie Lia office. The EKG was machine read , the white count had no differential.  The pt denies feeling bad, no focus of known infection and quiescient lung function, "the best it has been for a while."  The crucial question for me is what type of anesthesia she will have. Her pulmonary situation in the past has been exceedingly tenuous at times and bears evaluation if intubation is being contemplated. I will discuss with Dr Lajoyce Corners.    Prior Medications Reviewed Using: Patient Recall  Current Allergies (reviewed today): ! * ZAROXYLINE      Physical Exam  General:     Well-developed,well-nourished,in no acute distress; alert,appropriate and cooperative throughout examination, minimally congested, apparently from crying about knee surgery being cancelled/postponed. Head:     Normocephalic and atraumatic without obvious abnormalities. No apparent alopecia or balding. Eyes:     Conjunctiva clear bilaterally.  Ears:     External ear exam shows no significant lesions or deformities.  Otoscopic examination reveals clear canals, tympanic membranes are intact bilaterally without bulging, retraction, inflammation or discharge. Hearing is grossly normal  bilaterally. Nose:     External nasal examination shows no deformity or inflammation. Nasal mucosa are pink and moist without lesions or exudates. Mouth:     Oral mucosa and oropharynx without lesions or exudates.  Teeth in good repair. Neck:     No deformities, masses, or tenderness noted. Chest Wall:     No deformities, masses, or tenderness noted. Lungs:     Normal respiratory effort, chest expands symmetrically. Lungs are clear to auscultation, no crackles or wheezes heard today. Heart:     Normal rate and regular rhythm. S1 and S2 normal without gallop, murmur, click, rub or other extra sounds. Msk:     no edema.    Impression & Recommendations:  Problem # 1:  LEUKOCYTOSIS UNSPECIFIED (ICD-288.60) Assessment: New Will repeat her CBC and get diff today manually by Path review. Will do urine culture. Orders: T- * Misc. Laboratory test [phone]) Venipuncture [phone]) T-Culture, Urine ([id number]) T-CBC w/Diff [phone]) UA Dipstick W/ Micro (manual) ([id number])   Problem # 2:  NONSPECIFIC ABNORMAL ELECTROCARDIOGRAM (ICD-794.31) Assessment: New EKG today  and especially 06/16/06 from [facility] does not appear to have Q waves nor T wave changes. I feel the EKG in question is an over read.   Problem # 3:  HYPERTENSION, BENIGN ESSENTIAL (ICD-401.1) Assessment: Deteriorated Pt veery agitated today because of pain from her knee and need to postpone surgery .  Will follow. Her updated medication list for this problem  includes:    Lisinopril 2.5 Mg Tabs (Lisinopril) ..... Oine tab by mouth at night.    Hydrochlorothiazide 25 Mg Tabs (Hydrochlorothiazide) .Marland Kitchen... 1 daily by mouth for swelling  BP today: 140/90 Prior BP: 100/64 (07/26/2007)   Problem # 4:  EMPHYSEMA (ICD-492.8) Assessment: Unchanged I feel her abnormals were providence in that I have real concerns about the pt being intubated with knowing her state of respiratory compromise in the past acutely. I worry that intubation  and hospital atmosphere could be enough to kick her into an acute reaction making extubation difficult or significantly compromising recovery. Dr Lajoyce Corners relates that the plan at this point is for field block WITH intubation. Will request Dr Delford Field see the pt for preop eval.  Complete Medication List: 1)  Gabapentin 300 Mg Caps (Gabapentin) .... Take 1 capsule by mouth three times a day 2)  Lisinopril 2.5 Mg Tabs (Lisinopril) .... Oine tab by mouth at night. 3)  Nasonex 50 Mcg/act Susp (Mometasone furoate) .... Use as directed bid 4)  Flexeril 10 Mg Tabs (Cyclobenzaprine hcl) .Marland Kitchen.. 1 three times a day as needed / by mouth 5)  Tramadol Hcl 50 Mg Tabs (Tramadol hcl) .Marland Kitchen.. 1 qid  as needed pain 6)  Proair Hfa 108 (90 Base) Mcg/act Aers (Albuterol sulfate) .... 2 puffs q 4 hrs as needed wheezing 7)  Diclofenac Sodium 75 Mg Tbec (Diclofenac sodium) .Marland Kitchen.. 1 two times a day // as needed by mouth 8)  Zithromax Z-pak 250 Mg Tabs (Azithromycin) .... As directed. 9)  Xopenex 0.63 Mg/52ml Nebu (Levalbuterol hcl) .... Use in nebulizer as directed as needed qid max 10)  Hydrochlorothiazide 25 Mg Tabs (Hydrochlorothiazide) .Marland Kitchen.. 1 daily by mouth for swelling   Patient Instructions: 1)  Refer to Pulm (Dr Delford Field) for preop eval for probable intubation. 2)  Await urine culure. 3)  Send CBC with path review diff.   ] Laboratory Results   Urine Tests  Date/Time Recieved: February 22, 2008 5:00 PM Date/Time Reported: February 22, 2008 5:00 PM  Routine Urinalysis   Color: yellow Appearance: Clear Glucose: negative   (Normal Range: Negative) Bilirubin: negative   (Normal Range: Negative) Ketone: negative   (Normal Range: Negative) Spec. Gravity: 1.010   (Normal Range: 1.003-1.035) Blood: negative   (Normal Range: Negative) pH: 6.0   (Normal Range: 5.0-8.0) Protein: negative   (Normal Range: Negative) Urobilinogen: 0.2   (Normal Range: 0-1) Nitrite: negative   (Normal Range: Negative) Leukocyte Esterace:  negative   (Normal Range: Negative)  Urine Microscopic WBC/hpf: 0-1 RBC/hpf: 0 Bacteria: 0 Epithelial: Very rare

## 2010-07-01 NOTE — Assessment & Plan Note (Signed)
Summary: 1 MONTH FOLLOW UP/[facility]   Vital Signs:  Patient Profile:   50 Years Old Female Weight:      233 pounds O2 Sat:      95 % O2 treatment:    Room Air Temp:     99.1 degrees F oral Pulse rate:   86 / minute Pulse rhythm:   regular Resp:     26 per minute BP sitting:   100 / 64  (left arm) Cuff size:   large  Vitals Entered By: Providence Crosby (July 26, 2007 9:25 AM)                 Chief Complaint:  1 month f/u but patient c/o feeling awful// wheezing coughing up green phlegm.  History of Present Illness: Looks miserable. Is coughing up green mess with drainage down back of throat...husband was sick for two weeks.  Husb doing better.  Having no h/a, does have ear pain, mild rhinitis, most going down throat with PND, has ST and couch as above, some SOB, no N/V. Sleeping poorly, impacted some by back. Has taken nothing for this. Is tolerating new BP med w/o prob and has felt better on it. Urination now normal.    Prior Medications Reviewed Using: List Brought by Patient  Current Allergies (reviewed today): ! * ZAROXYLINE      Physical Exam  General:     Well-developed,well-nourished,in no acute distress; alert,appropriate and cooperative throughout examination, congested and looks miserable. Head:     Normocephalic and atraumatic without obvious abnormalities. No apparent alopecia or balding. Max sinuses tender. Eyes:     Conjunctiva clear bilaterally.  Ears:     Dull LR, nonerythem. Nose:     Inflamed and swollen. Mouth:     Oral mucosa and oropharynx without lesions or exudates.  Teeth in good repair. Thick PND. Neck:     No deformities, masses, or tenderness noted. Chest Wall:     No deformities, masses, or tenderness noted. Lungs:     Ronchi and wheezes throughout. Heart:     Normal rate and regular rhythm. S1 and S2 normal without gallop, murmur, click, rub or other extra sounds.    Impression & Recommendations:  Problem # 1:   BRONCHITIS-ACUTE (ICD-466.0) Assessment: New  The following medications were removed from the medication list:    Cipro 500 Mg Tabs (Ciprofloxacin hcl) ..... One tab by mouth two times a day  Her updated medication list for this problem includes:    Proair Hfa 108 (90 Base) Mcg/act Aers (Albuterol sulfate) .Marland Kitchen... 2 puffs q 4 hrs as needed wheezing    Zithromax Z-pak 250 Mg Tabs (Azithromycin) .Marland Kitchen... As directed.    Xopenex 0.63 Mg/21ml Nebu (Levalbuterol hcl) ..... Use in nebulizer as directed as needed qid max  Take antibiotics and other medications as directed. Encouraged to push clear liquids, get enough rest, and take acetaminophen as needed. To be seen in 5-7 days if no improvement, sooner if worse.   Problem # 2:  URI (ICD-465.9) Assessment: New See instructions. Her updated medication list for this problem includes:    Diclofenac Sodium 75 Mg Tbec (Diclofenac sodium) .Marland Kitchen... 1 two times a day // as needed by mouth Instructed on symptomatic treatment. Call if symptoms persist or worsen.   Problem # 3:  HYPERTENSION, BENIGN ESSENTIAL (ICD-401.1) Assessment: Improved Improived, will see back to reassess when feeling better. The following medications were removed from the medication list:    Hydrochlorothiazide 12.5 Mg Tabs (Hydrochlorothiazide) .Marland KitchenMarland KitchenMarland KitchenMarland Kitchen  Take 1 tablet by mouth once a day  Her updated medication list for this problem includes:    Lisinopril 2.5 Mg Tabs (Lisinopril) ..... Oine tab by mouth at night.  BP today: 100/64 Prior BP: 154/94 (06/23/2007)   Problem # 4:  EMPHYSEMA (ICD-492.8) Assessment: Unchanged Script for Xopenex called in.  Could probably use Spiriva...will discuss next time again.  Complete Medication List: 1)  Gabapentin 300 Mg Caps (Gabapentin) .... Take 1 capsule by mouth three times a day 2)  Lisinopril 2.5 Mg Tabs (Lisinopril) .... Oine tab by mouth at night. 3)  Nasonex 50 Mcg/act Susp (Mometasone furoate) .... Use as directed bid 4)  Flexeril 10  Mg Tabs (Cyclobenzaprine hcl) .Marland Kitchen.. 1 three times a day as needed / by mouth 5)  Tramadol Hcl 50 Mg Tabs (Tramadol hcl) .Marland Kitchen.. 1 qid  as needed pain 6)  Proair Hfa 108 (90 Base) Mcg/act Aers (Albuterol sulfate) .... 2 puffs q 4 hrs as needed wheezing 7)  Diclofenac Sodium 75 Mg Tbec (Diclofenac sodium) .Marland Kitchen.. 1 two times a day // as needed by mouth 8)  Zithromax Z-pak 250 Mg Tabs (Azithromycin) .... As directed. 9)  Xopenex 0.63 Mg/45ml Nebu (Levalbuterol hcl) .... Use in nebulizer as directed as needed qid max   Patient Instructions: 1)  Guaifenesen, pills or liquid, 600mg  by mouth AM and NOON for the next 4-5 days. 2)  Tylenol ES two tabs by mouth four times a day  3)  Push fluids.  4)  GUAIFENESIN  600mg  by mouth AM and NOON 5)   [facility] MUCOUS RELIEF EXPECTORANT (400 mg) 11/2 TABS      6)  Delsym per label at night    7)  Gargle              8)  RTC 3 mos to check BP 9)  Discuss Spiriva, Atrovent for nebulizer.    Prescriptions: XOPENEX 0.63 MG/3ML  NEBU (LEVALBUTEROL HCL) use in nebulizer as directed as needed qid max  #10 x 6   Entered and Authorized by:   Shaune Leeks MD   Signed by:   Shaune Leeks MD on 07/26/2007   Method used:   Electronically sent to ...       [address]       [address]       [place], [place]  [address]       Ph: [phone]       Fax: [phone]   RxID:   [id number] ZITHROMAX Z-PAK 250 MG  TABS (AZITHROMYCIN) as directed.  #1 pak x 0   Entered and Authorized by:   Shaune Leeks MD   Signed by:   Shaune Leeks MD on 07/26/2007   Method used:   Electronically sent to ...       [address]*       [address]       [place], [place]  [address]       Ph: [phone]       Fax: [phone]   RxID:   [id number]  ]  Appended Document: 1 MONTH FOLLOW UP/[facility] Prescriptions: HYDROCHLOROTHIAZIDE 25 MG  TABS (HYDROCHLOROTHIAZIDE) 1 DAILY BY MOUTH FOR SWELLING  #30 x 3   Entered by:   Providence Crosby   Authorized by:   Shaune Leeks MD   Signed by:   Providence Crosby on 08/03/2007   Method used:   Electronically sent to .[facility]  [address]       [address]       [place], [place]  [address]       Ph: [phone]       Fax: [phone]   RxID:   [id number]

## 2010-07-01 NOTE — Consult Note (Signed)
Summary: [facility]/Dr. [facility]Dr. Phoebe Perch   Imported By: Eleonore Chiquito 08/15/2007 16:57:28  _____________________________________________________________________  External Attachment:    Type:   Image     Comment:   External Document

## 2010-07-01 NOTE — Letter (Signed)
Summary: Dr.James Hirsch,[facility],Note  Dr.James Hirsch,[facility],Note   Imported By: Beau Fanny 11/01/2009 14:14:01  _____________________________________________________________________  External Attachment:    Type:   Image     Comment:   External Document

## 2010-07-01 NOTE — Assessment & Plan Note (Signed)
Summary: ? sinus infection   Vital Signs:  Patient Profile:   50 Years Old Female Weight:      230 pounds Temp:     98.2 degrees F rectal Pulse rate:   88 / minute Pulse rhythm:   regular BP sitting:   130 / 80  (left arm) Cuff size:   large  Vitals Entered By: Providence Crosby (February 28, 2008 2:47 PM)                 Chief Complaint:  ? SINUS INFECTION.  History of Present Illness: Pt saw Dr Delford Field yesterday and was deemed ok. She had pus come out of her tooth today. Hasn't seen a dentist in a long time. Has significant pain in the upper left jaw area. No fever or chills. Feels like she has a sinus infection.    Prior Medications Reviewed Using: List Brought by Patient  Current Allergies (reviewed today): ! * ZAROXYLINE      Physical Exam  General:     Well-developed,well-nourished,in no acute distress; alert,appropriate and cooperative throughout examination Head:     Normocephalic and atraumatic without obvious abnormalities. No apparent alopecia or balding. Left Max sinus area tender to palp Eyes:     Conjunctiva clear bilaterally.  Ears:     External ear exam shows no significant lesions or deformities.  Otoscopic examination reveals clear canals, tympanic membranes are intact bilaterally without bulging, inflammation or discharge. Hearing is grossly normal bilaterally. L TM dull, nonerythematous. Nose:     External nasal examination shows no deformity or inflammation. Nasal mucosa are pink and moist without lesions or exudates. Mouth:     Oral mucosa and oropharynx without lesions or exudates.  Teeth in poor  repair. Nubbins of posterior teeth look infcted with pus and necrotic tissue surrounding. Neck:     No deformities, masses, or tenderness noted. Lungs:     Normal respiratory effort, chest expands symmetrically. Lungs are clear to auscultation, no crackles or wheezes. Heart:     Normal rate and regular rhythm. S1 and S2 normal without gallop, murmur,  click, rub or other extra sounds.    Impression & Recommendations:  Problem # 1:  ABSCESS, TOOTH (ICD-522.5) Assessment: New Probably multiple based on appearance of posterior teeth. Start Amoxicillin. See Dentist.  Complete Medication List: 1)  Gabapentin 600 Mg Tabs (Gabapentin) .Marland Kitchen.. 1 by mouth three times a day 2)  Lisinopril 2.5 Mg Tabs (Lisinopril) .... Oine tab by mouth at night. 3)  Flexeril 10 Mg Tabs (Cyclobenzaprine hcl) .Marland Kitchen.. 1 three times a day as needed / by mouth 4)  Darvocet-n 100 100-650 Mg Tabs (Propoxyphene n-apap) .... As needed 5)  Proair Hfa 108 (90 Base) Mcg/act Aers (Albuterol sulfate) .... 2 puffs q 4 hrs as needed wheezing 6)  Diclofenac Sodium 75 Mg Tbec (Diclofenac sodium) .Marland Kitchen.. 1 two times a day // as needed by mouth 7)  Xopenex 0.63 Mg/66ml Nebu (Levalbuterol hcl) .... Use in nebulizer as directed as needed qid max 8)  Hydrochlorothiazide 25 Mg Tabs (Hydrochlorothiazide) .Marland Kitchen.. 1 daily by mouth for swelling 9)  Flovent Hfa 110 Mcg/act Aero (Fluticasone propionate  hfa) .... Two  puffs twice daily 10)  Fluticasone Propionate 50 Mcg/act Susp (Fluticasone propionate) .... Two puffs each nostril daily 11)  Amoxicillin 500 Mg Caps (Amoxicillin) .... 2 tabs by mouth two times a day   Patient Instructions: 1)  See dentist. 2)  Take Mucous relief daily.   Prescriptions: AMOXICILLIN 500 MG CAPS (AMOXICILLIN)  2 tabs by mouth two times a day  #40 x 0   Entered and Authorized by:   Shaune Leeks MD   Signed by:   Shaune Leeks MD on 02/28/2008   Method used:   Electronically to        [facility]* (retail)       [address]       [place], [place]  [address]       Ph: [phone]       Fax: [phone]   RxID:   [id number]  ]

## 2010-07-01 NOTE — Letter (Signed)
Summary: [facility] / BACK AND LEFT LEG PAIN / DR  [facility] / BACK AND LEFT LEG PAIN / DR. Colon Branch   Imported By: Carin Primrose 07/20/2008 11:06:15  _____________________________________________________________________  External Attachment:    Type:   Image     Comment:   External Document

## 2010-07-01 NOTE — Letter (Signed)
Summary: ERROR  [employer] [facility]  [address] [place], [place] [address]   Phone: [phone]  Fax: [phone]    March 13, 2010   Employee:  KARALYNN COTTONE    To Whom It May Concern:   For Medical reasons, please excuse the above named employee from work for the following dates:  Start:  March 13, 2010   End:  March 13, 2010   If you need additional information, please feel free to contact our office.         Sincerely,    [name]  MD

## 2010-07-01 NOTE — Assessment & Plan Note (Signed)
Summary: ? Sinus infection, chest congestion and some wheezing/RL   Vital Signs:  Patient profile:   50 year old female Height:      62 inches Weight:      241 pounds BMI:     44.24 O2 Sat:      95 % on Room air Temp:     98.9 degrees F tympanic Pulse rate:   96 / minute Pulse rhythm:   regular Resp:     26 per minute BP sitting:   120 / 80  (left arm) Cuff size:   large  Vitals Entered By: Providence Crosby LPN (December 27, 2008 11:13 AM)  O2 Flow:  Room air CC: Uri symptoms wheezing   History of Present Illness: Pt doesn't feel good, having trouble breathing...is back smoking. She has fever to "Lowgrade, no higher than 101." She has frontal headache, right ear pain, rhinitis that is greenish yellow all day long, ST from drainage, and cough to the point of getting lightheaded, productive same greenish drainagfe. She also has ST and Wheezing, and has had nausea, no vomiting, some wheezing. She has not taken anything nad has been using Ventolin fairly regularly, so much so theat the pharmacy told her it was too soon to fill it.  Problems Prior to Update: 1)  Chronic Obstructive Pulmonary Disease, Acute Exacerbation  (ICD-491.21) 2)  Abscess, Tooth  (ICD-522.5) 3)  Bronchitis, Obstructive Chronic  (ICD-491.20) 4)  Nonspecific Abnormal Electrocardiogram  (ICD-794.31) 5)  Leukocytosis Unspecified  (ICD-288.60) 6)  Uri  (ICD-465.9) 7)  Hypertension, Benign Essential  (ICD-401.1) 8)  Hx of Back Pain (LUMBAR SPINE DZ- [facility]  (ICD-724.5) 9)  Migraines, Hx of  (ICD-V13.8) 10)  Bipolar Affective Disorder, Manic  (ICD-296.40) 11)  Hypertriglyceridemia  (ICD-272.1) 12)  Hx of Pancreatitis  (ICD-577.0) 13)  Emphysema  (ICD-492.8) 14)  Nicotine Addiction  (ICD-305.1) 15)  Reactive Airway Disease  (ICD-493.90) 16)  Hypertension  (ICD-401.9) 17)  Obesity  (ICD-278.00) 18)  Swelling of Limb  (ICD-729.81) 19)  Knee Pain, Right  (ICD-719.46)  Medications Prior to Update: 1)  Gabapentin  600 Mg Tabs (Gabapentin) .Marland Kitchen.. 1 By Mouth Three Times A Day 2)  Lisinopril 2.5 Mg  Tabs (Lisinopril) .... Oine Tab By Mouth At Night. 3)  Flexeril 10 Mg  Tabs (Cyclobenzaprine Hcl) .Marland Kitchen.. 1 Three Times A Day As Needed / By Mouth 4)  Darvocet-N 100 100-650 Mg Tabs (Propoxyphene N-Apap) .... As Needed 5)  Proair Hfa 108 (90 Base) Mcg/act  Aers (Albuterol Sulfate) .... 2 Puffs Every  4 Hrs As Needed Wheezing 6)  Diclofenac Sodium 75 Mg  Tbec (Diclofenac Sodium) .Marland Kitchen.. 1 Two Times A Day // As Needed By Mouth 7)  Xopenex 0.63 Mg/52ml  Nebu (Levalbuterol Hcl) .... Use in Nebulizer As Directed As Needed Qid Max 8)  Hydrochlorothiazide 25 Mg  Tabs (Hydrochlorothiazide) .Marland Kitchen.. 1 Daily By Mouth For Swelling 9)  Fluticasone Propionate 50 Mcg/act Susp (Fluticasone Propionate) .... Two Puffs Each Nostril Daily 10)  Azithromycin 250 Mg Tabs (Azithromycin) .... 2 Tab By Mouth X 1 Day, Then 1 Tab By Mouth Daily 11)  Prednisone 20 Mg Tabs (Prednisone) .... 3 Tabs By Mouth Daily X 3 Days, Then 2 Tabs By Mouth Daily X 2 Days Then 1 Tab By Mouth Daily X 2 Days  Allergies: 1)  ! * Zaroxyline 2)  ! Elio Forget  Physical Exam  General:  Mildly fatigued appearing female in NAD, quiet and breathing comfortably as long as not moving.  Head:  Normocephalic and atraumatic without obvious abnormalities. No apparent alopecia or balding. Sinuses tender maxillaries. Eyes:  Conjunctiva clear bilaterally.  Ears:  clear fluid B TMs Nose:  External nasal examination shows no deformity or inflammation. Nasal mucosa are pink and moist without lesions or exudates. Mouth:  Oral mucosa and oropharynx without lesions or exudates.  Teeth in good repair. Thick tan PND. Neck:  No deformities, masses, or tenderness noted. Chest Wall:  No deformities, masses, or tenderness noted. Lungs:  normal respiratory effort, no intercostal retractions, and no accessory muscle use.  Diffuse expiratory wheeze and prolongued exp phase.  Heart:  Normal rate  and regular rhythm. S1 and S2 normal without gallop, murmur, click, rub or other extra sounds.   Impression & Recommendations:  Problem # 1:  CHRONIC OBSTRUCTIVE PULMONARY DISEASE, ACUTE EXACERBATION (ICD-491.21) Assessment Deteriorated  RECURRENCE.  Will give Albuterol Neb now to jump strart breating and follow immediately with Symbicort. Start Zithro max right away. See instructions. Dexamethasone now IM.  Orders: Nebulizer Tx (28366)  Problem # 2:  BRONCHITIS, OBSTRUCTIVE CHRONIC (ICD-491.20) Assessment: Unchanged  RECURRENCE See above.  Orders: Dexamethasone Sodium Phosphate 1mg  (J1100) Admin of Therapeutic Inj  intramuscular or subcutaneous (29476)  Problem # 3:  NICOTINE ADDICTION (ICD-305.1) Assessment: Unchanged RECURRENT.Marland KitchenMarland KitchenEncouraged to again quit.  Problem # 4:  HYPERTENSION (ICD-401.9) Assessment: Unchanged  Her updated medication list for this problem includes:    Lisinopril 2.5 Mg Tabs (Lisinopril) ..... Oine tab by mouth at night.    Hydrochlorothiazide 25 Mg Tabs (Hydrochlorothiazide) .Marland Kitchen... 1 daily by mouth for swelling  BP today: 120/80 Prior BP: 122/80 (07/30/2008)  Problem # 5:  OBESITY (ICD-278.00) Assessment: Unchanged Needs to work on this....discussed.  Complete Medication List: 1)  Gabapentin 600 Mg Tabs (Gabapentin) .Marland Kitchen.. 1 by mouth three times a day 2)  Lisinopril 2.5 Mg Tabs (Lisinopril) .... Oine tab by mouth at night. 3)  Flexeril 10 Mg Tabs (Cyclobenzaprine hcl) .Marland Kitchen.. 1 three times a day as needed / by mouth 4)  Darvocet-n 100 100-650 Mg Tabs (Propoxyphene n-apap) .... As needed 5)  Proair Hfa 108 (90 Base) Mcg/act Aers (Albuterol sulfate) .... 2 puffs every  4 hrs as needed wheezing 6)  Diclofenac Sodium 75 Mg Tbec (Diclofenac sodium) .Marland Kitchen.. 1 two times a day // as needed by mouth 7)  Xopenex 0.63 Mg/30ml Nebu (Levalbuterol hcl) .... Use in nebulizer as directed as needed qid max 8)  Hydrochlorothiazide 25 Mg Tabs (Hydrochlorothiazide) .Marland Kitchen.. 1  daily by mouth for swelling 9)  Zithromax Z-pak 250 Mg Tabs (Azithromycin) .... As dir 10)  Symbicort 80-4.5 Mcg/act Aero (Budesonide-formoterol fumarate) .... One inh two times a day  Patient Instructions: 1)  Take Zithromax and repaet next Friday. 2)  Take Symbicort two times a day (given samp) 3)  Take Guaifenesin by [facility] and getting MUCOUS RELIEF EXPECTORANT (400mg ), take 11/2 tabs by mouth AM and NOON. 4)  Drink lots of fluids anytime taking Guaifenesin.  5)  Steroid shot today. 6)  Take Tyl Es 2 three times a day. 7)  QUIT SMOKING AND GET REGULAR EXERCISE 8)  RTC if not improved, to ER if worsens. Prescriptions: SYMBICORT 80-4.5 MCG/ACT AERO (BUDESONIDE-FORMOTEROL FUMARATE) one inh two times a day  #1 inh x 12   Entered and Authorized by:   Shaune Leeks MD   Signed by:   Shaune Leeks MD on 12/27/2008   Method used:   [facility] [facility] [address] [id number]       [address]       [place], [place]  [address]       Ph: [phone]       Fax: [phone]   RxID:   [id number] Rhys Martini 250 MG TABS (AZITHROMYCIN) as dir  #1 x 1   Entered and Authorized by:   Shaune Leeks MD   Signed by:   Shaune Leeks MD on 12/27/2008   Method used:   [facility]        [facility]  [address] [id number]       [address]       [place], [place]  [address]       Ph: [phone]       Fax: [phone]   RxID:   [id number]    Medication Administration  Injection # 1:    Medication: Dexamethasone Sodium Phosphate 1mg     Diagnosis: BRONCHITIS, OBSTRUCTIVE CHRONIC (ICD-491.20)    Route: IM    Site: LUOQ gluteus    Exp Date: 09/2009    Lot #: [id number]    Mfr: baxter    Comments: n.d.c. [phone] 10mg  per ml 1ml im    Patient tolerated injection without complications    Given by: Providence Crosby LPN (December 27, 2008 11:46  AM)  Orders Added: 1)  Est. Patient Level III [99213] 2)  Nebulizer Tx [94640] 3)  Dexamethasone Sodium Phosphate 1mg  [J1100] 4)  Admin of Therapeutic Inj  intramuscular or subcutaneous [09628]

## 2010-07-01 NOTE — Assessment & Plan Note (Signed)
Summary: CHECK BP/CLE   Vital Signs:  Patient Profile:   50 Years Old Female Weight:      237 pounds Temp:     99 degrees F oral Pulse rate:   80 / minute Pulse rhythm:   regular BP sitting:   154 / 94  (left arm) Cuff size:   large  Vitals Entered By: Providence Crosby (June 23, 2007 12:05 PM)                 Chief Complaint:  elevated bp fills funny.  History of Present Illness: hAS HAD bp ELEVATION LATELY ALTHO CHARTREVIEW HAS HAD HER MILDLY ELEVATED FOR SOME TIME...FEELS MILDLY LIGHTHEADED AND"FOGGY" AT PRESENT. hAS TAKEN HCTZ FOR A SHORT WHILE FOR EDEMA, IS DUE TO STOP THAT SOON. ALSO C/O SLIGHT BURNING WITH URINATION AND FEELING LIKE SHE NEEDS TO GO WITHOUT DOING MUCH FOR THE LAST FEW DAYS WHICH MAKES HER FEEL LIKE SHE CONSISTENTLY NEEDS TO GO BACK AGAIN.  Current Allergies (reviewed today): ! * ZAROXYLINE      Physical Exam  General:     Well-developed,well-nourished,in no acute distress; alert,appropriate and cooperative throughout examination, seems congested but denies. Head:     Normocephalic and atraumatic without obvious abnormalities. No apparent alopecia or balding. Sinuses nontender. Eyes:     Conjunctiva clear bilaterally. Mildly erythem and swollen lower eyelids. Ears:     External ear exam shows no significant lesions or deformities.  Otoscopic examination reveals clear canals, tympanic membranes are intact bilaterally without bulging, retraction, inflammation or discharge. Hearing is grossly normal bilaterally. Nose:     External nasal examination shows no deformity or inflammation. Nasal mucosa are pink and moist without lesions or exudates. Mouth:     Oral mucosa and oropharynx without lesions or exudates.  Teeth in good repair. Neck:     No deformities, masses, or tenderness noted. Chest Wall:     No deformities, masses, or tenderness noted. Lungs:     Normal respiratory effort, chest expands symmetrically. Lungs are clear to auscultation, no  crackles or wheezes. Heart:     Normal rate and regular rhythm. S1 and S2 normal without gallop, murmur, click, rub or other extra sounds. Abdomen:     No suprapubic tenderness. Msk:     No CVAT.    Impression & Recommendations:  Problem # 1:  HYPERTENSION, BENIGN ESSENTIAL (ICD-401.1) Assessment: New Start Lisinopril 2.5 mg, stop HCTZ. Her updated medication list for this problem includes:    Lisinopril 2.5 Mg Tabs (Lisinopril) ..... Oine tab by mouth at night.  BP today: 154/94 Prior BP: 146/114 (05/04/2007)   Problem # 2:  DYSURIA (ICD-788.1) Appears to be UTI, will treat with Cipro.  Urine culture sent. The following medications were removed from the medication list:    Avelox 400 Mg Tabs (Moxifloxacin hcl) ..... One by mouth qd  Her updated medication list for this problem includes:    Cipro 500 Mg Tabs (Ciprofloxacin hcl) ..... One tab by mouth two times a day  Orders: UA Dipstick W/ Micro (81[address]) T-Culture, Urine ([address])  Encouraged to push clear liquids, get enough rest, and take acetaminophen as needed. To be seen in 10 days if no improvement, sooner if worse.   Medications Added to Medication List This Visit: 1)  Lisinopril 2.5 Mg Tabs (Lisinopril) .... Oine tab by mouth at night. 2)  Flexeril 10 Mg Tabs (Cyclobenzaprine hcl) .Marland Kitchen.. 1 tablet as needed as needed 3)  Cipro 500 Mg Tabs (Ciprofloxacin hcl) .... One  tab by mouth two times a day  Complete Medication List: 1)  Pro-air Aerosol  .... Inhale 2 puffs by mouth every 4 hrs prn wheezing 2)  Gabapentin 300 Mg Caps (Gabapentin) .... Take 1 capsule by mouth three times a day 3)  Diclofenac 500 Mg  .... Take 1 tablet by mouth two times a day 4)  Lisinopril 2.5 Mg Tabs (Lisinopril) .... Oine tab by mouth at night. 5)  Nasonex 50 Mcg/act Susp (Mometasone furoate) .... Use as directed bid 6)  Flexeril 10 Mg Tabs (Cyclobenzaprine hcl) .Marland Kitchen.. 1 tablet as needed as needed 7)  Cipro 500 Mg Tabs (Ciprofloxacin hcl)  .... One tab by mouth two times a day   Patient Instructions: 1)  RTC one month, sooner if sxs of urine don't improve.    Prescriptions: CIPRO 500 MG  TABS (CIPROFLOXACIN HCL) one tab by mouth two times a day  #14 x 0   Entered and Authorized by:   Shaune Leeks MD   Signed by:   Shaune Leeks MD on 06/23/2007   Method used:   Electronically sent to ...       [address]*       [address]       [place], [place]  [address]       Ph: [phone]       Fax: [phone]   RxID:   [id number] LISINOPRIL 2.5 MG  TABS (LISINOPRIL) oine tab by mouth at night.  #30 x 12   Entered and Authorized by:   Shaune Leeks MD   Signed by:   Shaune Leeks MD on 06/23/2007   Method used:   Electronically sent to ...       [address]       [address]       [place], [place]  [address]       Ph: [phone]       Fax: [phone]   RxID:   [id number]  ] Laboratory Results   Urine Tests  Date/Time Recieved: June 23, 2007 12:42 PM Date/Time Reported: June 23, 2007 12:42 PM  Routine Urinalysis   Color: yellow Appearance: Hazy Glucose: negative   (Normal Range: Negative) Bilirubin: negative   (Normal Range: Negative) Ketone: negative   (Normal Range: Negative) Spec. Gravity: 1.015   (Normal Range: 1.003-1.035) Blood: negative   (Normal Range: Negative) pH: 6.0   (Normal Range: 5.0-8.0) Protein: negative   (Normal Range: Negative) Urobilinogen: 0.2   (Normal Range: 0-1) Leukocyte Esterace: trace   (Normal Range: Negative)

## 2010-07-01 NOTE — Consult Note (Signed)
Summary: [facility]/Consultation Report/Dr. [facility]/Consultation Report/Dr. Phoebe Perch   Imported By: Mickle Asper 05/13/2007 14:37:41  _____________________________________________________________________  External Attachment:    Type:   Image     Comment:   External Document

## 2010-07-01 NOTE — Assessment & Plan Note (Signed)
Summary: Pulmonary OV   Referred by:  Dr Lajoyce Corners orthopedics  Chief Complaint:  Pt needing surgical clearance for right knee replacement by Dr. Lajoyce Corners.  Pt c/o PND. and COPD follow up.  History of Present Illness: Pulmonary OV   This is a 50 years old female who presents for COPD follow up.  The patient complains of history of diagnosed COPD, shortness of breath, cough, and mucous production, but denies chest tightness, chest pain worse with breathing and coughing, wheezing, nocturnal awakening, exercise induced symptoms, and congestion.  Prior testing has included Cxr.  Dyspnea is described as with walking one block; with walking stairs.  Oxygen therapy: not on supplemental O2.  Since the last visit, the patient reports the same degree of cough and the same degree of dyspnea.  The stage of COPD is determined to be Stage I (mild).    Not seen in this office since 4/08.  Hx of asthmatic bronchitis and active smoking.  Only on as needed beta 2 agonist short acting as needed and not using frequently.  Here for preop clearance of R TKR per Dr Lajoyce Corners. Noted recent high WBC16K L shift ?etiology.  UC neg.   I note OV 9/29 per PCP with abscess in tooth as likely etiology of high WBC.   Notes some pn drip.  No wheeze.  Occ cough prod clear.   Pt denies any significant sore throat, nasal congestion or excess secretions, fever, chills, sweats, unintended weight loss, pleurtic or exertional chest pain, orthopnea PND, or leg swelling Pt denies any increase in rescue therapy over baseline, denies waking up needing it or having any early am or nocturnal exacerbations of coughing/wheezing/or dyspnea. Pt also denies any obvious fluctuation in symptoms wiht weather or environmental change or other alleviating or aggravating factors            Prior Medications Reviewed Using: Patient Recall  Updated Prior Medication List: GABAPENTIN 600 MG TABS (GABAPENTIN) 1 by mouth three times a day LISINOPRIL 2.5 MG  TABS  (LISINOPRIL) oine tab by mouth at night. FLEXERIL 10 MG  TABS (CYCLOBENZAPRINE HCL) 1 three times a day as needed / by mouth DARVOCET-N 100 100-650 MG TABS (PROPOXYPHENE N-APAP) as needed PROAIR HFA 108 (90 BASE) MCG/ACT  AERS (ALBUTEROL SULFATE) 2 puffs q 4 hrs as needed wheezing DICLOFENAC SODIUM 75 MG  TBEC (DICLOFENAC SODIUM) 1 two times a day // as needed by mouth XOPENEX 0.63 MG/3ML  NEBU (LEVALBUTEROL HCL) use in nebulizer as directed as needed qid max HYDROCHLOROTHIAZIDE 25 MG  TABS (HYDROCHLOROTHIAZIDE) 1 DAILY BY MOUTH FOR SWELLING  Current Allergies (reviewed today): ! * ZAROXYLINE  Past Medical History:    Reviewed history from 06/01/2007 and no changes required:       Current Problems:        BRONCHITIS, OBSTRUCTIVE CHRONIC (ICD-491.20)            -FeV1 64%  TLC 105%   DLCO  55% 2008             -FeV1 81% FeF 25-75 43%  2009       NONSPECIFIC ABNORMAL ELECTROCARDIOGRAM (ICD-794.31)       LEUKOCYTOSIS UNSPECIFIED (ICD-288.60)       URI (ICD-465.9)       HYPERTENSION, BENIGN ESSENTIAL (ICD-401.1)       Hx of BACK PAIN (LUMBAR SPINE DZ- DR [facility]) (ICD-724.5)       MIGRAINES, HX OF (ICD-V13.8)       BIPOLAR AFFECTIVE DISORDER, MANIC (  ICD-296.40)       HYPERTRIGLYCERIDEMIA (ICD-272.1)       Hx of PANCREATITIS (ICD-577.0)       EMPHYSEMA (ICD-492.8)       NICOTINE ADDICTION (ICD-305.1)       REACTIVE AIRWAY DISEASE (ICD-493.90)       HYPERTENSION (ICD-401.9)       OBESITY (ICD-278.00)       SWELLING OF LIMB (ICD-729.81)       KNEE PAIN, RIGHT (QIH-474.25)         Past Surgical History:    Reviewed history from 06/01/2007 and no changes required:       Cholecystectomy (07/1982)       Retained stone with ERCP secondary to retained stone       Tubal ligation 1995       Right knee surgery- reconstructions for ACL insuff (9563,8756, 1989, 1991, 1994)       Suicide attempt 06/2001       ERCP/ sphincterotomy- stenosis (01/15/06)       Maxillofacial CT- left max mucocele  (06/14/06)       CT Chest normal except c/w active inflammation/ infection.  Left renal atrophy  (06/14/2006)       Shannon- pneumonia, acute asthma, left maxillary sinusitis (01/11-01/18/2008)   Family History:    Reviewed history from 06/01/2007 and no changes required:       Father: HTN       Mother: HTN, chol       Siblings: 1 brother- MI in early 83's, 1 sister  Social History:    Reviewed history from 06/01/2007 and no changes required:       Marital Status: Married       Children: 3       Occupation:    Risk Factors:  Tobacco use:  current    Cigarettes:  Yes -- 1/10 pack(s) per day  Mammogram History:    Date of Last Mammogram:  06/09/2000  PAP Smear History:    Date of Last PAP Smear:  09/07/2000   Review of Systems      See HPI      See HPI for Pulmonary, Cardiac, General, and ENT review of systems.   Vital Signs:  Patient Profile:   50 Years Old Female Height:     62 inches Weight:      234.8 pounds O2 Sat:      94 % O2 treatment:    Room Air Temp:     98.0 degrees F oral Pulse rate:   98 / minute BP sitting:   130 / 80  (left arm) Cuff size:   large  Vitals Entered By: Michel Bickers CMA (February 27, 2008 3:51 PM)                 Physical Exam  Gen: WD WN     WF     in NAD    NCAT Heent:  no jvd, no TMG, no cervical LNademopathy, orophyx clear,  nares with clear watery drainage. Cor: RRR nl s1/s2  no s3/s4  no m r h g Abd: soft NT BSA   no masses  No HSM  no rebound or guarding Ext perfused with no c v e v.d Neuro: intact, moves all 4s, CN II-XII intact, DTRs intact Chest: distant BS  no wheezes, rales, rhonchi   no egophony  no consolidative breath sounds, mild hyperresonance to percussion Skin: clear  Genital/Rectal :deferred     Pulmonary Function Test  Date: 02/27/2008 Height (in.): 62 Gender: Female  Pre-Spirometry FVC    Value: 3.14 L/min   Pred: 2.95 L/min     % Pred: 106 % FEV1    Value: 2.00 L     Pred: 2.46 L     %  Pred: 81 % FEV1/FVC  Value: 64 %     Pred: 84 %     % Pred: 76 % FEF 25-75  Value: 1.20 L/min   Pred: 2.79 L/min     % Pred: 43 %  Comments: moderate peripheral airflow obstruction Entered by:   Michel Bickers CMA  February 27, 2008 4:31 PM    Impression & Recommendations:  Problem # 1:  BRONCHITIS, OBSTRUCTIVE CHRONIC (ICD-491.20) Assessment: Unchanged Chronic peripheral airflow obstruction due to mild asthmatic bronchitis exacerbated by ongoing tobacco use;  high WBC is due to non-pulmonary cause : per PCP MD care plan: Ok for L Total knee replacement surgery f/u cbc to normalization prior to surgery per PCP MD nasal rinse daily start flovent HFA two puff two times a day cont as needed short acting beta 2 agonist inhaler stop smoking :  f/u on chantix rx per pcp md recommendations    Orders: Est. Patient Level V ([address]) Spirometry w/Graph ([address])   Medications Added to Medication List This Visit: 1)  Gabapentin 600 Mg Tabs (Gabapentin) .Marland Kitchen.. 1 by mouth three times a day 2)  Darvocet-n 100 100-650 Mg Tabs (Propoxyphene n-apap) .... As needed 3)  Flovent Hfa 110 Mcg/act Aero (Fluticasone propionate  hfa) .... Two  puffs twice daily 4)  Fluticasone Propionate 50 Mcg/act Susp (Fluticasone propionate) .... Two puffs each nostril daily  Complete Medication List: 1)  Gabapentin 600 Mg Tabs (Gabapentin) .Marland Kitchen.. 1 by mouth three times a day 2)  Lisinopril 2.5 Mg Tabs (Lisinopril) .... Oine tab by mouth at night. 3)  Flexeril 10 Mg Tabs (Cyclobenzaprine hcl) .Marland Kitchen.. 1 three times a day as needed / by mouth 4)  Darvocet-n 100 100-650 Mg Tabs (Propoxyphene n-apap) .... As needed 5)  Proair Hfa 108 (90 Base) Mcg/act Aers (Albuterol sulfate) .... 2 puffs q 4 hrs as needed wheezing 6)  Diclofenac Sodium 75 Mg Tbec (Diclofenac sodium) .Marland Kitchen.. 1 two times a day // as needed by mouth 7)  Xopenex 0.63 Mg/55ml Nebu (Levalbuterol hcl) .... Use in nebulizer as directed as needed qid max 8)   Hydrochlorothiazide 25 Mg Tabs (Hydrochlorothiazide) .Marland Kitchen.. 1 daily by mouth for swelling 9)  Flovent Hfa 110 Mcg/act Aero (Fluticasone propionate  hfa) .... Two  puffs twice daily 10)  Fluticasone Propionate 50 Mcg/act Susp (Fluticasone propionate) .... Two puffs each nostril daily 11)  Amoxicillin 500 Mg Caps (Amoxicillin) .... 2 tabs by mouth two times a day   Patient Instructions: 1)  Start Flovent two puffs twice daily 2)  start flonase two sprays each nostril daily  3)  nasal rinse twice daily 4)  return 6 weeks   Prescriptions: FLUTICASONE PROPIONATE 50 MCG/ACT SUSP (FLUTICASONE PROPIONATE) Two puffs each nostril daily  #1 x 4   Entered and Authorized by:   Storm Frisk MD   Signed by:   Storm Frisk MD on 02/27/2008   Method used:   Electronically to        [facility]* (retail)       [address]       [place], [place]  [address]       Ph: [phone]       Fax:  [phone]   RxID:   [id number] FLOVENT HFA 110 MCG/ACT  AERO (FLUTICASONE PROPIONATE  HFA) Two  puffs twice daily Brand medically necessary #1 x 2   Entered and Authorized by:   Storm Frisk MD   Signed by:   Storm Frisk MD on 02/27/2008   Method used:   Electronically to        [facility]* (retail)       [address]       [place], [place]  [address]       Ph: [phone]       Fax: [phone]   RxID:   [id number]  ]  Appended Document: Pulmonary OV fax to Dr Lajoyce Corners of orthopedic surgery

## 2010-07-01 NOTE — Assessment & Plan Note (Signed)
Summary: REFILL MEDICATION/CLE   Vital Signs:  Patient profile:   50 year old female Weight:      242.25 pounds BMI:     44.47 Temp:     98.4 degrees F oral Pulse rate:   84 / minute Pulse rhythm:   regular BP sitting:   120 / 78  (left arm) Cuff size:   large  Vitals Entered By: Sydell Axon LPN (Oct 17, 2009 3:42 PM) CC: Needs refills on medications   History of Present Illness: Pt here for medication refills. Sh is also congexsted. She has headaches periorobitally and has ear popping, left worse than right. She used her nebulizer yesterday. She has not had fever or chills, She has rhinitis, mostly clear. She has mild ST, and productive cough with waking up with glob in throat. She is having some SOB, no N/V. She used her neb last night and has used her inhaler   Problems Prior to Update: 1)  Chest Pain, Non-cardiac, Muscle Pull  (ICD-786.59) 2)  Chronic Obstructive Pulmonary Disease, Acute Exacerbation  (ICD-491.21) 3)  Abscess, Tooth  (ICD-522.5) 4)  Bronchitis, Obstructive Chronic  (ICD-491.20) 5)  Nonspecific Abnormal Electrocardiogram  (ICD-794.31) 6)  Leukocytosis Unspecified  (ICD-288.60) 7)  Uri  (ICD-465.9) 8)  Hypertension, Benign Essential  (ICD-401.1) 9)  Hx of Back Pain (LUMBAR SPINE DZ- DR [facility])  (ICD-724.5) 10)  Migraines, Hx of  (ICD-V13.8) 11)  Bipolar Affective Disorder, Manic  (ICD-296.40) 12)  Hypertriglyceridemia  (ICD-272.1) 13)  Hx of Pancreatitis  (ICD-577.0) 14)  Emphysema  (ICD-492.8) 15)  Nicotine Addiction  (ICD-305.1) 16)  Reactive Airway Disease  (ICD-493.90) 17)  Hypertension  (ICD-401.9) 18)  Obesity  (ICD-278.00) 19)  Swelling of Limb  (ICD-729.81) 20)  Knee Pain, Right  (ICD-719.46)  Medications Prior to Update: 1)  Gabapentin 600 Mg Tabs (Gabapentin) .Marland Kitchen.. 1 By Mouth Three Times A Day 2)  Lisinopril 2.5 Mg  Tabs (Lisinopril) .... Oine Tab By Mouth At Night. 3)  Flexeril 10 Mg  Tabs (Cyclobenzaprine Hcl) .Marland Kitchen.. 1 Three Times A Day As  Needed / By Mouth 4)  Proair Hfa 108 (90 Base) Mcg/act  Aers (Albuterol Sulfate) .... 2 Puffs Every  4 Hrs As Needed Wheezing 5)  Diclofenac Sodium 75 Mg  Tbec (Diclofenac Sodium) .Marland Kitchen.. 1 Two Times A Day // As Needed By Mouth 6)  Hydrochlorothiazide 25 Mg  Tabs (Hydrochlorothiazide) .Marland Kitchen.. 1 Daily By Mouth For Swelling 7)  Symbicort 80-4.5 Mcg/act Aero (Budesonide-Formoterol Fumarate) .... One Inh Two Times A Day 8)  Zithromax Z-Pak 250 Mg Tabs (Azithromycin) .... As Dir 9)  Xopenex 0.63 Mg/74ml Nebu (Levalbuterol Hcl) .... Use in Nebuliozer Qid As Needed For Restricted Breathing. 10)  Flexeril 10 Mg Tabs (Cyclobenzaprine Hcl) .... One Tab Three Times A Day As Needed Muscle Spasm.  Allergies: 1)  ! * Zaroxyline 2)  ! Elio Forget  Physical Exam  General:  Well-developed,well-nourished; alert,appropriate and cooperative throughout examination, mildly congested and minimally audibly wheezing Head:  Normocephalic and atraumatic without obvious abnormalities. No apparent alopecia or balding. Sinuses minimally tender in maxillaries. Eyes:  Conjunctiva clear bilaterally. Allergic shiners. Ears:  clear fluid B TMs, dull LR of TMs. Nose:  External nasal examination shows no deformity or inflammation. Nasal mucosa are pink and moist without lesions or exudates. Mouth:  Oral mucosa and oropharynx without lesions or exudates.  Teeth in good repair. Thick clear PND. Neck:  No deformities, masses, or tenderness noted. Chest Wall:  No deformities, masses, or tenderness  noted. Lungs:  normal respiratory effort, no intercostal retractions, and no accessory muscle use. Distant breath sounds, minimal fine wheezes. Heart:  Normal rate and regular rhythm. S1 and S2 normal without gallop, murmur, click, rub or other extra sounds.   Impression & Recommendations:  Problem # 1:  BRONCHITIS, OBSTRUCTIVE CHRONIC (ICD-491.20) Assessment Unchanged Baseline to slightly worse. See instructions. Scripts given.  Problem  # 2:  URI (ICD-465.9) Assessment: Unchanged  Recurrent pulmonary emboli instructions. Her updated medication list for this problem includes:    Diclofenac Sodium 75 Mg Tbec (Diclofenac sodium) .Marland Kitchen... 1 two times a day // as needed by mouth  Instructed on symptomatic treatment. Call if symptoms persist or worsen.   Problem # 3:  HYPERTENSION, BENIGN ESSENTIAL (ICD-401.1) Scripts written. Her updated medication list for this problem includes:    Lisinopril 2.5 Mg Tabs (Lisinopril) ..... Oine tab by mouth at night.    Hydrochlorothiazide 25 Mg Tabs (Hydrochlorothiazide) .Marland Kitchen... 1 daily by mouth for swelling  BP today: 120/78 Prior BP: 112/74 (09/09/2009)  Complete Medication List: 1)  Gabapentin 600 Mg Tabs (Gabapentin) .Marland Kitchen.. 1 by mouth three times a day 2)  Lisinopril 2.5 Mg Tabs (Lisinopril) .... Oine tab by mouth at night. 3)  Flexeril 10 Mg Tabs (Cyclobenzaprine hcl) .Marland Kitchen.. 1 three times a day as needed / by mouth 4)  Proair Hfa 108 (90 Base) Mcg/act Aers (Albuterol sulfate) .... 2 puffs every  4 hrs as needed wheezing 5)  Diclofenac Sodium 75 Mg Tbec (Diclofenac sodium) .Marland Kitchen.. 1 two times a day // as needed by mouth 6)  Hydrochlorothiazide 25 Mg Tabs (Hydrochlorothiazide) .Marland Kitchen.. 1 daily by mouth for swelling 7)  Symbicort 80-4.5 Mcg/act Aero (Budesonide-formoterol fumarate) .... One inh two times a day 8)  Xopenex 0.63 Mg/54ml Nebu (Levalbuterol hcl) .... Use in nebuliozer four times a day as needed for restricted breathing. 9)  Flexeril 10 Mg Tabs (Cyclobenzaprine hcl) .... One tab three times a day as needed muscle spasm. 10)  Zithromax Z-pak 250 Mg Tabs (Azithromycin) .... As dir  Patient Instructions: 1)  Take Guaifenesin by going to [facility] or [facility] and getting MUCOUS RELIEF EXPECTORANT (400mg ), take 11/2 tabs by mouth AM and NOON. 2)  Drink lots of fluids anytime taking Guaifenesin.  3)  Start back on Symbicort 4)  Use Proair as rescue. 5)  Use Zithro if needed. 6)   Come in if worsens. Prescriptions: PROAIR HFA 108 (90 BASE) MCG/ACT  AERS (ALBUTEROL SULFATE) 2 puffs every  4 hrs as needed wheezing  #18 Gram x 0   Entered and Authorized by:   Shaune Leeks MD   Signed by:   Shaune Leeks MD on 10/17/2009   Method used:   Print then Give to Patient   RxID:   [id number] Christena Deem Z-PAK 250 MG TABS (AZITHROMYCIN) as dir  #1 pak x 0   Entered and Authorized by:   Shaune Leeks MD   Signed by:   Shaune Leeks MD on 10/17/2009   Method used:   Print then Give to Patient   RxID:   [id number] HYDROCHLOROTHIAZIDE 25 MG  TABS (HYDROCHLOROTHIAZIDE) 1 DAILY BY MOUTH FOR SWELLING  #30 Each x 12   Entered by:   Sydell Axon LPN   Authorized by:   Shaune Leeks MD   Signed by:   Sydell Axon LPN on 84/69/6295   Method used:   Print then Give to Patient   RxID:   [id number] LISINOPRIL  2.5 MG  TABS (LISINOPRIL) oine tab by mouth at night.  #30 Each x 12   Entered by:   Sydell Axon LPN   Authorized by:   Shaune Leeks MD   Signed by:   Sydell Axon LPN on 16/03/9603   Method used:   Print then Give to Patient   RxID:   [id number]   Current Allergies (reviewed today): ! * ZAROXYLINE ! * Elio Forget

## 2010-07-01 NOTE — Assessment & Plan Note (Signed)
Summary: ROA FOR FOLLOW-UP/JRR   Vital Signs:  Patient profile:   50 year old female Weight:      231.50 pounds O2 Sat:      92 % on Room air Temp:     98.6 degrees F oral Pulse rate:   80 / minute Pulse rhythm:   regular BP sitting:   122 / 80  (left arm) Cuff size:   large  Vitals Entered By: Selena Batten Dance CMA Duncan Dull) (March 13, 2010 11:43 AM)  O2 Flow:  Room air CC: Recheck   History of Present Illness: CC: f/u COPD exacerbation 03/10/10  50 yo with COPD/asthmatic bronchitis seen 3d ago with dx COPD exacerbation, treated with alb/atrovent neb x 2, shot of dexamethasone 10mg  and sent home with prednisone 50mg  and doxycycline x 10 days, seen yesterday and given 2 more alb/atrovent nebs, prescribed combivent which hasn't filled yet, presents today for followup.  Feels about same.  Thinks coughing up more sputum now.  O2 sat 92-93%.  Still wheezy, coughing, congested.    No fevers/chills, abd pain,n/v/d.  + diaphoretic and nausated intermittently.  Prior, taking symbicort (bid), xopenex neb (taking Q4 hours), alb inh as needed.  still smoking, has smoked 1-2 cig/day while feeling bad.  Allergies: 1)  ! * Zaroxolyn 2)  ! Elio Forget  Past History:  Past Medical History: Last updated: 03/10/2010 Current Problems:  BRONCHITIS, OBSTRUCTIVE CHRONIC (ICD-491.20)      -FeV1 64%  TLC 105%   DLCO  55% 2008       -FeV1 81% FeF 25-75 43%  2009 NONSPECIFIC ABNORMAL ELECTROCARDIOGRAM (ICD-794.31) LEUKOCYTOSIS UNSPECIFIED (ICD-288.60) HYPERTENSION, BENIGN ESSENTIAL (ICD-401.1) Hx of BACK PAIN (LUMBAR SPINE DZ- [facility]) (ICD-724.5) MIGRAINES, HX OF (ICD-V13.8) BIPOLAR AFFECTIVE DISORDER, MANIC (ICD-296.40) HYPERTRIGLYCERIDEMIA (ICD-272.1) Hx of PANCREATITIS (ICD-577.0) EMPHYSEMA (ICD-492.8) NICOTINE ADDICTION (ICD-305.1) REACTIVE AIRWAY DISEASE (ICD-493.90) HYPERTENSION (ICD-401.9) OBESITY (ICD-278.00) SWELLING OF LIMB (ICD-729.81) KNEE PAIN, RIGHT (ICD-719.46)  Social  History: Last updated: 03/10/2010 Smoking x 15PY history (1/2 ppd), no EtOH, no rec drugs. Marital Status: Married Children: 3 Occupation:  PMH-FH-SH reviewed for relevance  Review of Systems       per HPI  Physical Exam  General:  short of breath, but able to finish sentences. Mouth:  Oral mucosa and oropharynx without lesions or exudates.  Teeth in good repair. Thick clear PND. Neck:  No deformities, masses, or tenderness noted. Lungs:  audible wheezing upper and lower airways.  prolonged exp phase.  improved WOB, improved air movement since evaluation yesterday.    some more improvement after 2 more alb/atrovent nebs. Heart:  Normal rate and regular rhythm. S1 and S2 normal without gallop, murmur, click, rub or other extra sounds. Pulses:  2+ rad pulses Extremities:  no edema, no cyanosis Skin:  Intact without suspicious lesions or rashes   Impression & Recommendations:  Problem # 1:  CHRONIC OBSTRUCTIVE PULMONARY DISEASE, ACUTE EXACERBATION (ICD-491.21) clinical exam improved.  Pulse ox 92%, up to 95% after tx.  unsure baseline pulse ox in this COPDer.  continued SOB with wheezing despite alb/atrovent x 2.  Currently on prednisone 50mg  daily and doxycycline 100 bid x 2 days.  using xopenex neb Q4-5 hours.  Still taking symbicort 80 two times a day.  did not fill combivent.  Recommend fill.  Broaden abx coverage to avelox from doxy.  Advised if at any point worse, to go to ER for eval.  If better (which seems to be case), to continue current treatment plan.  To call  us tomorrow with update.  will need spirometry in future once acute exac resolved.  Will need to quit smoking.  Spoke with patient and husband of importance of this.  Orders: Albuterol Sulfate Sol 1mg  unit dose (U0454) Atrovent 1mg  (Neb) (U9811) Nebulizer Tx (91478) Atrovent 1mg  (Neb) [phone]) Albuterol Sulfate Sol 1mg  unit dose (Z3086)  Complete Medication List: 1)  Gabapentin 600 Mg Tabs (Gabapentin) .Marland Kitchen.. 1 by  mouth three times a day 2)  Lisinopril 2.5 Mg Tabs (Lisinopril) .... Oine tab by mouth at night. 3)  Flexeril 10 Mg Tabs (Cyclobenzaprine hcl) .Marland Kitchen.. 1 three times a day as needed / by mouth 4)  Proair Hfa 108 (90 Base) Mcg/act Aers (Albuterol sulfate) .... 2 puffs every  4 hrs as needed wheezing 5)  Hydrochlorothiazide 25 Mg Tabs (Hydrochlorothiazide) .Marland Kitchen.. 1 daily by mouth for swelling 6)  Symbicort 80-4.5 Mcg/act Aero (Budesonide-formoterol fumarate) .... One inh two times a day 7)  Xopenex 0.63 Mg/76ml Nebu (Levalbuterol hcl) .... Use in nebuliozer four times a day as needed for restricted breathing. 8)  Mobic 7.5 Mg Tabs (Meloxicam) .Marland Kitchen.. 1 by mouth two times a day 9)  Prednisone 50 Mg Tabs (Prednisone) .... Take one daily for 10 days 10)  Avelox 400 Mg Tabs (Moxifloxacin hcl) .... Take one pill daily for 5 days 11)  Combivent 18-103 Mcg/act Aero (Ipratropium-albuterol) .... Two puffs four times a day  Patient Instructions: 1)  Call me tomorrow with update.  If at any point you're worse, you need to go to hospital.  If same, come in for appointment.  If better, continue current treatment below: 2)  Continue prednisone 3)  Continue symbicort 4)  Continue xopenex 5)  STOP doxycycline 6)  START avelox (better coverage antibiotic) one pill daily for 7 days. 7)  START combivent 2 puffs four times a day (please fill this today). 8)  IF having increased shortness of breath despite these measures overnight, or fevers or worsening, please go to ER for evaluation. 9)  You need to quit smoking. Prescriptions: AVELOX 400 MG TABS (MOXIFLOXACIN HCL) take one pill daily for 5 days  #5 x 0   Entered and Authorized by:   [name]  MD   Signed by:   [name]  MD on 03/13/2010   Method used:   Electronically to        [facility] [phone]* (retail)       [address]       [place], [place]  [address]       Ph: [phone]       Fax: [phone]   RxID:    [id number]   Current Allergies (reviewed today): ! * ZAROXOLYN ! * VICODAN   Medication Administration  Medication # 1:    Medication: Albuterol Sulfate Sol 1mg  unit dose    Diagnosis: CHRONIC OBSTRUCTIVE PULMONARY DISEASE, ACUTE EXACERBATION (ICD-491.21)    Dose: 2.5    Route: inhaled    Exp Date: 07/29/2010    Lot #: [id number]    Mfr: Nephron    Comments: Per Dr. Sharen Hones    Patient tolerated medication without complications    Given by: Selena Batten Dance CMA Duncan Dull) (March 13, 2010 12:07 PM)  Medication # 2:    Medication: Atrovent 1mg  (Neb)    Diagnosis: CHRONIC OBSTRUCTIVE PULMONARY DISEASE, ACUTE EXACERBATION (ICD-491.21)    Dose: 0.5    Route: inhaled    Exp Date: 07/29/2010    Lot #: [id number]    Mfr: Nephron  Comments: Per Dr. Sharen Hones    Patient tolerated medication without complications    Given by: Selena Batten Dance CMA Duncan Dull) (March 13, 2010 12:08 PM)  Medication # 3:    Medication: Albuterol Sulfate Sol 1mg  unit dose    Diagnosis: CHRONIC OBSTRUCTIVE PULMONARY DISEASE, ACUTE EXACERBATION (ICD-491.21)    Dose: 2.5    Route: inhaled    Exp Date: 07/29/2010    Lot #: [id number]    Mfr: Nephron    Comments: Per Dr. Sharen Hones    Patient tolerated medication without complications    Given by: Selena Batten Dance CMA Duncan Dull) (March 13, 2010 12:09 PM)  Medication # 4:    Medication: Atrovent 1mg  (Neb)    Diagnosis: CHRONIC OBSTRUCTIVE PULMONARY DISEASE, ACUTE EXACERBATION (ICD-491.21)    Dose: 0.5    Route: inhaled    Exp Date: 07/29/2010    Lot #: [id number]    Mfr: Nephron    Comments: Per Dr. Sharen Hones    Patient tolerated medication without complications    Given by: Selena Batten Dance CMA Duncan Dull) (March 13, 2010 12:10 PM)  Orders Added: 1)  Albuterol Sulfate Sol 1mg  unit dose [J7613] 2)  Atrovent 1mg  (Neb) [W1093] 3)  Nebulizer Tx [23557] 4)  Atrovent 1mg  (Neb) [D2202] 5)  Albuterol Sulfate Sol 1mg  unit dose [R4270]

## 2010-07-01 NOTE — Letter (Signed)
Summary: Out of Work  [employer] at [facility]  [address] [place], [place] [address]   Phone: [phone]  Fax: [phone]    March 10, 2010   Employee:  Brenda [name]    To Whom It May Concern:   For Medical reasons, please excuse the above named employee from work for the following dates:  Start:  March 10, 2010   End:  March 10, 2010   If you need additional information, please feel free to contact our office.         Sincerely,    [name]  MD

## 2010-07-01 NOTE — Progress Notes (Signed)
Summary: copy of EKG faxed  Phone Note From Other Clinic   Caller: [facility] short stay Call For: dr schaller Summary of Call: Copy of EKG faxed to [facility] Short Stay pre admissions Initial call taken by: Lowella Petties,  March 19, 2008 5:03 PM

## 2010-07-01 NOTE — Letter (Signed)
Summary: Out of Work  [employer] at [facility]  [address] [place], [place] [address]   Phone: [phone]  Fax: [phone]    March 13, 2010   Employee:  Brenda [name]    To Whom It May Concern:   For Medical reasons, please excuse the above named employee from work for the following dates:  Start:  March 13, 2010   End:  March 14, 2010   If you need additional information, please feel free to contact our office.         Sincerely,    [name]  MD

## 2010-07-01 NOTE — Progress Notes (Signed)
Summary: proair    Prescriptions: PROAIR HFA 108 (90 BASE) MCG/ACT  AERS (ALBUTEROL SULFATE) 2 puffs q 4 hrs as needed wheezing  #1 x 1   Entered by:   Lowella Petties   Authorized by:   Gildardo Griffes FNP   Signed by:   Lowella Petties on 07/30/2008   Method used:   Electronically to        [facility] [phone]* (retail)       [address]       [place], [place]  [address]       Ph: [phone]       Fax: [phone]   RxID:   [id number]

## 2010-07-01 NOTE — Letter (Signed)
Summary: Dr.James Hirsch,[facility],Note  Dr.James Hirsch,[facility],Note   Imported By: Beau Fanny 02/12/2009 16:13:05  _____________________________________________________________________  External Attachment:    Type:   Image     Comment:   External Document

## 2010-07-01 NOTE — Progress Notes (Signed)
Summary: Update  Phone Note Outgoing Call   Call placed by: Janee Morn CMA Duncan Dull),  March 14, 2010 11:54 AM Call placed to: Patient Summary of Call: Spoke with patient. She is sounding and feeling much better! She said when she talks a lot or exherts herself too much, she will start coughing, but other than that, she is doing much better. She was able to get Avelox and Combivent filled yesterday and started both. I told her not to hesitate to go to the [facility] over the weekend if she started getting worse and she understood. She was very greatful for your care in helping her get to feeling better.   Initial call taken by: Janee Morn CMA Duncan Dull),  March 14, 2010 11:57 AM  Follow-up for Phone Call        thank you. Follow-up by: [name]  MD,  March 14, 2010 12:45 PM

## 2010-07-01 NOTE — Letter (Signed)
Summary: Dr.James Hirsch,[facility],Note  Dr.James Hirsch,[facility],Note   Imported By: Beau Fanny 03/19/2009 14:43:22  _____________________________________________________________________  External Attachment:    Type:   Image     Comment:   External Document

## 2010-07-01 NOTE — Progress Notes (Signed)
Summary: wants lab results  Phone Note Call from Patient Call back at Home Phone [phone] Call back at Work Phone [phone]   Caller: Patient Call For: dr schaller Summary of Call: This pt is calling for her blood work results, please advise Initial call taken by: Lowella Petties,  February 27, 2008 1:38 PM  Follow-up for Phone Call        Discussed with pt. May have occult sinus infection. Give me a call after sees Dr Delford Field today. Follow-up by: Shaune Leeks MD,  February 27, 2008 1:54 PM

## 2010-07-01 NOTE — Assessment & Plan Note (Signed)
Summary: CONGESTION,WHEEZING,EARS/CLE   Vital Signs:  Patient profile:   50 year old female Weight:      231.50 pounds O2 Sat:      94 % on Room air Temp:     98.6 degrees F oral Pulse rate:   84 / minute Pulse rhythm:   regular BP sitting:   118 / 70  (left arm) Cuff size:   large  Vitals Entered By: Selena Batten Dance CMA (AAMA) (March 10, 2010 11:21 AM)  O2 Flow:  Room air CC: Wheezing/cough/congestion   History of Present Illness: CC: wheezing, coughing, congestion  4d h/o wheezing, coughing, congestion, ears stuffy.  Started with ST.  + PNDrip.  + more cough, more productive, more SOB.  No fevers/chills, abd pain,n/v/d.  No new joint pains or muscle aches  + husband with cough and drainage.  + h/o asthma and COPD.  takes symbicort (bid), xopenex neb (taking Q4 hours), alb inh as needed  still smoking  Current Medications (verified): 1)  Gabapentin 600 Mg Tabs (Gabapentin) .Marland Kitchen.. 1 By Mouth Three Times A Day 2)  Lisinopril 2.5 Mg  Tabs (Lisinopril) .... Oine Tab By Mouth At Night. 3)  Flexeril 10 Mg  Tabs (Cyclobenzaprine Hcl) .Marland Kitchen.. 1 Three Times A Day As Needed / By Mouth 4)  Proair Hfa 108 (90 Base) Mcg/act  Aers (Albuterol Sulfate) .... 2 Puffs Every  4 Hrs As Needed Wheezing 5)  Hydrochlorothiazide 25 Mg  Tabs (Hydrochlorothiazide) .Marland Kitchen.. 1 Daily By Mouth For Swelling 6)  Symbicort 80-4.5 Mcg/act Aero (Budesonide-Formoterol Fumarate) .... One Inh Two Times A Day 7)  Xopenex 0.63 Mg/24ml Nebu (Levalbuterol Hcl) .... Use in Nebuliozer Four Times A Day As Needed For Restricted Breathing. 8)  Mobic 7.5 Mg Tabs (Meloxicam) .Marland Kitchen.. 1 By Mouth Two Times A Day  Allergies: 1)  ! * Zaroxolyn 2)  ! Elio Forget  Past History:  Social History: Last updated: 03/10/2010 Smoking x 15PY history (1/2 ppd), no EtOH, no rec drugs. Marital Status: Married Children: 3 Occupation:   Past Medical History: Current Problems:  BRONCHITIS, OBSTRUCTIVE CHRONIC (ICD-491.20)      -FeV1 64%  TLC  105%   DLCO  55% 2008       -FeV1 81% FeF 25-75 43%  2009 NONSPECIFIC ABNORMAL ELECTROCARDIOGRAM (ICD-794.31) LEUKOCYTOSIS UNSPECIFIED (ICD-288.60) HYPERTENSION, BENIGN ESSENTIAL (ICD-401.1) Hx of BACK PAIN (LUMBAR SPINE DZ- DR [facility]) (ICD-724.5) MIGRAINES, HX OF (ICD-V13.8) BIPOLAR AFFECTIVE DISORDER, MANIC (ICD-296.40) HYPERTRIGLYCERIDEMIA (ICD-272.1) Hx of PANCREATITIS (ICD-577.0) EMPHYSEMA (ICD-492.8) NICOTINE ADDICTION (ICD-305.1) REACTIVE AIRWAY DISEASE (ICD-493.90) HYPERTENSION (ICD-401.9) OBESITY (ICD-278.00) SWELLING OF LIMB (ICD-729.81) KNEE PAIN, RIGHT (ICD-719.46)  Social History: Smoking x 15PY history (1/2 ppd), no EtOH, no rec drugs. Marital Status: Married Children: 3 Occupation:   Review of Systems       per HPI  Physical Exam  General:  WDWN,  Head:  Normocephalic and atraumatic without obvious abnormalities. No apparent alopecia or balding. Sinuses minimally tender in maxillaries. Eyes:  Conjunctiva clear bilaterally, PERRLA Ears:  clear fluid B TMs, dull LR of TMs. Nose:  External nasal examination shows no deformity or inflammation. Nasal mucosa are pink and moist without lesions or exudates.  + dry D/C Mouth:  Oral mucosa and oropharynx without lesions or exudates.  Teeth in good repair. Thick clear PND. Neck:  No deformities, masses, or tenderness noted. Lungs:  audible wheezing upper and lower airways.  prolonged exp phase.  slight increased WOB, poor air movement with improvement after 2 alb/atrovent nebs, normal WOB, but continued  insp/exp wheezing.  pulse ox unchanged before and after nebs (94%RA) Heart:  Normal rate and regular rhythm. S1 and S2 normal without gallop, murmur, click, rub or other extra sounds. Pulses:  2+ rad pulses Extremities:  no edema   Impression & Recommendations:  Problem # 1:  CHRONIC OBSTRUCTIVE PULMONARY DISEASE, ACUTE EXACERBATION (ICD-491.21) significant wheezing on exam, improved after 2 alb/atrovent nebs.   gave shot of dexamethasone.  currently on xopenex neb, proair inh, symbicort 80 two times a day.  Treat with course of steroids and doxycycline x 10 days, RTC 1-2 days for f/u.  encouraged tobacco cessation.  red flags to go to ER discussed.  consider spiriva or combivent for better COPD control.  will need spirometry in future.  RTC 1 mo for f/u COPD and smoking.  Orders: Admin of Therapeutic Inj  intramuscular or subcutaneous (16109) Dexamethasone Sodium Phosphate 1mg  (J1100) Albuterol Sulfate Sol 1mg  unit dose (U0454) Atrovent 1mg  (Neb) (U9811) Nebulizer Tx (91478) Albuterol Sulfate Sol 1mg  unit dose (G9562) Atrovent 1mg  (Neb) (Z3086)  Problem # 2:  NICOTINE ADDICTION (ICD-305.1) Encouraged smoking cessation.  will be main effort after over acute exacerbation.  Complete Medication List: 1)  Gabapentin 600 Mg Tabs (Gabapentin) .Marland Kitchen.. 1 by mouth three times a day 2)  Lisinopril 2.5 Mg Tabs (Lisinopril) .... Oine tab by mouth at night. 3)  Flexeril 10 Mg Tabs (Cyclobenzaprine hcl) .Marland Kitchen.. 1 three times a day as needed / by mouth 4)  Proair Hfa 108 (90 Base) Mcg/act Aers (Albuterol sulfate) .... 2 puffs every  4 hrs as needed wheezing 5)  Hydrochlorothiazide 25 Mg Tabs (Hydrochlorothiazide) .Marland Kitchen.. 1 daily by mouth for swelling 6)  Symbicort 80-4.5 Mcg/act Aero (Budesonide-formoterol fumarate) .... One inh two times a day 7)  Xopenex 0.63 Mg/78ml Nebu (Levalbuterol hcl) .... Use in nebuliozer four times a day as needed for restricted breathing. 8)  Mobic 7.5 Mg Tabs (Meloxicam) .Marland Kitchen.. 1 by mouth two times a day 9)  Prednisone 50 Mg Tabs (Prednisone) .... Take one daily for 10 days 10)  Doxycycline Hyclate 100 Mg Caps (Doxycycline hyclate) .... Take one daily for 10 days  Patient Instructions: 1)  Please return Tuesday or wednesday for follow up breathing. 2)  Please return in 1 month for follow smoking and COPD. We may need to start you on another medicine. 3)  You need to stop smoking for your  lungs. 4)  Take antibiotic as directed, steroids as directed for 10 days. 5)  Once over this exacerbation, start taking symbicort 2 puffs twice daily (increased dose of controller medicine to help prevent further COPD flares). 6)  Good to see you today. 7)  If breathing or shortness of breath worsening or if not improving as expected, or if fevers, you need to go to ER for evaluation. Prescriptions: DOXYCYCLINE HYCLATE 100 MG CAPS (DOXYCYCLINE HYCLATE) take one daily for 10 days  #20 x 0   Entered and Authorized by:   [name]  MD   Signed by:   [name]  MD on 03/10/2010   Method used:   Electronically to        [facility] [phone]* (retail)       [address]       [place], [place]  [address]       Ph: [phone]       Fax: [phone]   RxID:   [id number] PREDNISONE 50 MG TABS (PREDNISONE) take one daily for 10 days  #10 x 0  Entered and Authorized by:   [name]  MD   Signed by:   [name]  MD on 03/10/2010   Method used:   Electronically to        [facility] [phone]* (retail)       [address]       [place], [place]  [address]       Ph: [phone]       Fax: [phone]   RxID:   [id number]   Current Allergies (reviewed today): ! * ZAROXOLYN ! * VICODAN   Medication Administration  Injection # 1:    Medication: Dexamethasone Sodium Phosphate 1mg     Diagnosis: CHRONIC OBSTRUCTIVE PULMONARY DISEASE, ACUTE EXACERBATION (ICD-491.21)    Route: IM    Site: RUOQ gluteus    Exp Date: 07/01/2010    Lot #: [id number]    Mfr: Baxter    Comments: 10 mg per Dr. Sharen Hones    Patient tolerated injection without complications    Given by: Selena Batten Dance CMA Duncan Dull) (March 10, 2010 12:19 PM)  Medication # 1:    Medication: Albuterol Sulfate Sol 1mg  unit dose    Diagnosis: CHRONIC OBSTRUCTIVE PULMONARY DISEASE, ACUTE EXACERBATION (ICD-491.21)    Dose: 2.5    Route: inhaled    Exp Date: 07/29/2010    Lot #: [id number]    Mfr:  Nephron    Comments: Per Dr. Sharen Hones    Patient tolerated medication without complications    Given by: Selena Batten Dance CMA Duncan Dull) (March 10, 2010 11:49 AM)  Medication # 2:    Medication: Atrovent 1mg  (Neb)    Diagnosis: CHRONIC OBSTRUCTIVE PULMONARY DISEASE, ACUTE EXACERBATION (ICD-491.21)    Dose: 0.5    Route: inhaled    Exp Date: 07/29/2010    Lot #: [id number]    Mfr: Nephron    Comments: Per Dr. Sharen Hones    Patient tolerated medication without complications    Given by: Selena Batten Dance CMA Duncan Dull) (March 10, 2010 11:49 AM)  Medication # 3:    Medication: Albuterol Sulfate Sol 1mg  unit dose    Diagnosis: CHRONIC OBSTRUCTIVE PULMONARY DISEASE, ACUTE EXACERBATION (ICD-491.21)    Dose: 2.5    Route: inhaled    Exp Date: 07/29/2010    Lot #: [id number]    Mfr: Nephron    Comments: Per Dr. Sharen Hones    Patient tolerated medication without complications    Given by: Selena Batten Dance CMA Duncan Dull) (March 10, 2010 12:20 PM)  Medication # 4:    Medication: Atrovent 1mg  (Neb)    Diagnosis: CHRONIC OBSTRUCTIVE PULMONARY DISEASE, ACUTE EXACERBATION (ICD-491.21)    Dose: 0.5    Route: inhaled    Exp Date: 07/29/2010    Lot #: [id number]    Mfr: Nephron    Comments: Per Dr. Sharen Hones    Patient tolerated medication without complications    Given by: Selena Batten Dance CMA Duncan Dull) (March 10, 2010 12:20 PM)  Orders Added: 1)  Admin of Therapeutic Inj  intramuscular or subcutaneous [96372] 2)  Dexamethasone Sodium Phosphate 1mg  [J1100] 3)  Albuterol Sulfate Sol 1mg  unit dose [J7613] 4)  Atrovent 1mg  (Neb) [O2703] 5)  Nebulizer Tx [50093] 6)  Albuterol Sulfate Sol 1mg  unit dose [J7613] 7)  Atrovent 1mg  (Neb) [G1829] 8)  Est. Patient Level III [93716]

## 2010-07-01 NOTE — Assessment & Plan Note (Signed)
Summary: leg pain   Vital Signs:  Patient Profile:   50 Years Old Female Weight:      234 pounds Temp:     98.1 degrees F oral Pulse rate:   100 / minute BP sitting:   146 / 114  (left arm) Cuff size:   large  Vitals Entered By: Cooper Render (May 04, 2007 10:08 AM)                 Chief Complaint:  R) leg pain.  History of Present Illness: Here due to severe pain in R knee to ankle.  Had surg 1984--tried t lace everything back to gether, 1988--ACL replaced--artificial ligaments, 1991--removed cyst--Mortison and Rendal, would perfer to see someone else.  Taking Diclofenac two times a day.  Elevates when sitting.  Swells badly.  Seeing Dr Gretta Began, neurosurg for back problem--surg to be after the first of the year.  Current Allergies (reviewed today): ! * ZAROXYLINE Updated/Current Medications (including changes made in today's visit):  * PRO-AIR AEROSOL inhale 2 puffs by mouth every 4 hrs prn wheezing GABAPENTIN 300 MG  CAPS (GABAPENTIN) Take 1 capsule by mouth three times a day * DICLOFENAC 500 MG Take 1 tablet by mouth two times a day HYDROCHLOROTHIAZIDE 25 MG  TABS (HYDROCHLOROTHIAZIDE) 1 once daily as needed swelling      Review of Systems      See HPI   Physical Exam  General:     alert, well-developed, and well-nourished.  morbidly obese Head:     normocephalic, atraumatic, and no abnormalities observed.   Lungs:     normal respiratory effort.   Msk:     L knee edematus and tender along jointline bilat, and inferiorly, crepitus, well healed surgical scars present.  Limps. Extremities:     1+ pitting edema ankle to knee L Neurologic:     alert & oriented X3 and sensation intact to light touch.   Skin:     turgor normal, color normal, and no rashes.   Psych:     normally interactive.      Impression & Recommendations:  Problem # 1:  KNEE PAIN, RIGHT (ICD-719.46) Assessment: Deteriorated marked edema and tenderness in L knee, previous surg  x3--refer to Ortho for eval and tx continue Diclofenac for pain Orders: Orthopedic Referral (Ortho) Orthopedic Referral (Ortho)   Problem # 2:  SWELLING OF LIMB (ICD-729.81) Assessment: New will add HCTZ 25 to reduce swelling to make her more comfortable  Problem # 3:  OBESITY (ICD-278.00) Assessment: Deteriorated discussed need to reduce caloric intake  Complete Medication List: 1)  Pro-air Aerosol  .... Inhale 2 puffs by mouth every 4 hrs prn wheezing 2)  Gabapentin 300 Mg Caps (Gabapentin) .... Take 1 capsule by mouth three times a day 3)  Diclofenac 500 Mg  .... Take 1 tablet by mouth two times a day 4)  Hydrochlorothiazide 25 Mg Tabs (Hydrochlorothiazide) .Marland Kitchen.. 1 once daily as needed swelling     Prescriptions: HYDROCHLOROTHIAZIDE 25 MG  TABS (HYDROCHLOROTHIAZIDE) 1 once daily as needed swelling  #30 x 1   Entered and Authorized by:   Gildardo Griffes FNP   Signed by:   Gildardo Griffes FNP on 05/04/2007   Method used:   Print then Give to Patient   RxID:   [id number]  ]

## 2010-07-01 NOTE — Progress Notes (Signed)
Summary: Ventolin HFA refill  Phone Note Refill Request Call back at [phone] Message from:  [facility] on December 19, 2008 9:26 AM  [facility] [address] request refill for Ventolin HFA. Pt has COPD. Was not sure if you would OK refill or need to see pt.   Method Requested: Electronic Initial call taken by: Lewanda Rife LPN,  December 19, 2008 9:26 AM    Prescriptions: PROAIR HFA 108 (90 BASE) MCG/ACT  AERS (ALBUTEROL SULFATE) 2 puffs every  4 hrs as needed wheezing  #1 x 0   Entered and Authorized by:   Gildardo Griffes FNP   Signed by:   Lewanda Rife LPN on 53/66/4403   Method used:   [facility] [phone][facility]ail[facility]       [address]       [place], [place]  [address]       Ph: [phone]       Fax: [phone]   RxID:   [id number]

## 2010-07-01 NOTE — Progress Notes (Signed)
Summary: Ventolin HFA refill  Phone Note Refill Request Call back at [phone] Message from:  [facility] [address] on May 29, 2009 11:35 AM  Refills Requested: Medication #1:  PROAIR HFA 108 (90 BASE) MCG/ACT  AERS 2 puffs every  4 hrs as needed wheezing   Last Refilled: 12/27/2008 [facility] [address] request refill on Ventolin HFA. Pt does not have appt. scheduled. Pt last saw you on 12/27/08. Please advise.    Method Requested: Electronic Initial call taken by: Lewanda Rife LPN,  May 29, 2009 11:37 AM    Prescriptions: PROAIR HFA 108 (90 BASE) MCG/ACT  AERS (ALBUTEROL SULFATE) 2 puffs every  4 hrs as needed wheezing  #18 Gram x 0   Entered and Authorized by:   Shaune Leeks MD   Signed by:   Shaune Leeks MD on 05/29/2009   Method used:   [facility]  [address] [id number]       [address]       [place], [place]  [address]       Ph: [phone]       Fax: [phone]   RxID:   [id number]   Appended Document: Ventolin HFA [facility]ill [facility] [address] as instructed.Patient notified as instructed by telephone.

## 2010-07-01 NOTE — Progress Notes (Signed)
Summary: chest pain, arms tingling  Phone Note Call from Patient   Caller: Patient Summary of Call: Pt states that her BP is elevated this morning, feels some pressure in her chest, feels light headed, feelings of heaviness and tingling in her arms.  Per Dr. Para March, advised pt to go to ER for evaluation of new neurological problems along with elevated BP.  Pt. agreed, said her mother in law will take her. Initial call taken by: Lowella Petties CMA,  February 24, 2010 8:45 AM  Follow-up for Phone Call        agree with above.  Follow-up by: Crawford Givens MD,  February 24, 2010 9:00 AM

## 2010-07-01 NOTE — Assessment & Plan Note (Signed)
Summary: 12:00 F/U PER DR Ulysses Alper/CLE   Vital Signs:  Patient profile:   50 year old female Weight:      231.50 pounds O2 Sat:      92 % on Room air Temp:     98.3 degrees F oral Pulse rate:   94 / minute Pulse rhythm:   regular BP sitting:   124 / 72  (left arm) Cuff size:   large  Vitals Entered By: Selena Batten Dance CMA Duncan Dull) (March 12, 2010 11:49 AM)  O2 Flow:  Room air CC: Follow up   History of Present Illness: CC: f/u COPD exacerbation 03/10/10  50 yo with COPD/asthmatic bronchitis seen 2d ago with dx COPD exacerbation, treated with alb/atrovent neb x 2, shot of dexamethasone 10mg  and sent home with prednisone 50mg  and doxycycline x 10 days presents today for followup.  Feels worse.  O2 sat down to 92% (previously 94-95%).  Still wheezy, coughing, congested.  + increased cough, SOB, sputum production.  No fevers/chills, abd pain,n/v/d.  No new joint pains or muscle aches.  + diaphoretic.   takes symbicort (bid), xopenex neb (taking Q4 hours), alb inh as needed  still smoking.  Current Medications (verified): 1)  Gabapentin 600 Mg Tabs (Gabapentin) .Marland Kitchen.. 1 By Mouth Three Times A Day 2)  Lisinopril 2.5 Mg  Tabs (Lisinopril) .... Oine Tab By Mouth At Night. 3)  Flexeril 10 Mg  Tabs (Cyclobenzaprine Hcl) .Marland Kitchen.. 1 Three Times A Day As Needed / By Mouth 4)  Proair Hfa 108 (90 Base) Mcg/act  Aers (Albuterol Sulfate) .... 2 Puffs Every  4 Hrs As Needed Wheezing 5)  Hydrochlorothiazide 25 Mg  Tabs (Hydrochlorothiazide) .Marland Kitchen.. 1 Daily By Mouth For Swelling 6)  Symbicort 80-4.5 Mcg/act Aero (Budesonide-Formoterol Fumarate) .... One Inh Two Times A Day 7)  Xopenex 0.63 Mg/82ml Nebu (Levalbuterol Hcl) .... Use in Nebuliozer Four Times A Day As Needed For Restricted Breathing. 8)  Mobic 7.5 Mg Tabs (Meloxicam) .Marland Kitchen.. 1 By Mouth Two Times A Day 9)  Prednisone 50 Mg Tabs (Prednisone) .... Take One Daily For 10 Days 10)  Doxycycline Hyclate 100 Mg Caps (Doxycycline Hyclate) .... Take One Daily  For 10 Days  Allergies: 1)  ! * Zaroxolyn 2)  ! Elio Forget  Past History:  Past Medical History: Last updated: 03/10/2010 Current Problems:  BRONCHITIS, OBSTRUCTIVE CHRONIC (ICD-491.20)      -FeV1 64%  TLC 105%   DLCO  55% 2008       -FeV1 81% FeF 25-75 43%  2009 NONSPECIFIC ABNORMAL ELECTROCARDIOGRAM (ICD-794.31) LEUKOCYTOSIS UNSPECIFIED (ICD-288.60) HYPERTENSION, BENIGN ESSENTIAL (ICD-401.1) Hx of BACK PAIN (LUMBAR SPINE DZ- DR [facility]) (ICD-724.5) MIGRAINES, HX OF (ICD-V13.8) BIPOLAR AFFECTIVE DISORDER, MANIC (ICD-296.40) HYPERTRIGLYCERIDEMIA (ICD-272.1) Hx of PANCREATITIS (ICD-577.0) EMPHYSEMA (ICD-492.8) NICOTINE ADDICTION (ICD-305.1) REACTIVE AIRWAY DISEASE (ICD-493.90) HYPERTENSION (ICD-401.9) OBESITY (ICD-278.00) SWELLING OF LIMB (ICD-729.81) KNEE PAIN, RIGHT (UEA-540.98)  Past Surgical History: Last updated: 06/01/2007 Cholecystectomy (07/1982) Retained stone with ERCP secondary to retained stone Tubal ligation 1995 Right knee surgery- reconstructions for ACL insuff (1191,4782, 1989, 1991, 1994) Suicide attempt 06/2001 ERCP/ sphincterotomy- stenosis (01/15/06) Maxillofacial CT- left max mucocele (06/14/06) CT Chest normal except c/w active inflammation/ infection.  Left renal atrophy  (06/14/2006) [place]- pneumonia, acute asthma, left maxillary sinusitis (01/11-01/18/2008)  Family History: Last updated: 06/01/2007 Father: HTN Mother: HTN, chol Siblings: 1 brother- MI in early 56's, 1 sister  Social History: Last updated: 03/10/2010 Smoking x 15PY history (1/2 ppd), no EtOH, no rec drugs. 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 Head:  Normocephalic and atraumatic without obvious abnormalities. No apparent alopecia or balding. Eyes:  Conjunctiva clear bilaterally, PERRLA Nose:   External nasal examination shows no deformity or inflammation. Nasal mucosa are pink and moist without lesions or exudates.  + dry D/C Mouth:  Oral mucosa and oropharynx without lesions or exudates.  Teeth in good repair. Thick clear PND. Neck:  No deformities, masses, or tenderness noted. Lungs:  audible wheezing upper and lower airways.  prolonged exp phase.  increased WOB, poor air movement.  + abd breathing.  some improvement after 2 more alb/atrovent nebs. Heart:  Normal rate and regular rhythm. S1 and S2 normal without gallop, murmur, click, rub or other extra sounds. Pulses:  2+ rad pulses Extremities:  no edema Skin:  Intact without suspicious lesions or rashes   Impression & Recommendations:  Problem # 1:  CHRONIC OBSTRUCTIVE PULMONARY DISEASE, ACUTE EXACERBATION (ICD-491.21) Assessment Deteriorated pulse ox down to 92%.  after 2 alb/atrovent nebs, pulse ox 93-94% (same as previous visit).  continued SOB with wheezing despite alb/atrovent x 2.  Currently on prednisone 50mg  daily and doxycycline 100 bid x 2 days.  using xopenex neb Q4-5 hours.  Still taking symbicort 80 two times a day.  add combivent while acute exacerbation to hopefully prevent hospitalization.  Discussed tenuous situation, if not improving as expected, to call 911.  Discussed very low threshold to admit or go to ER.  Pt prefers to continue trial outpatient treatment, understands risks of this.  States husband will be able to keep eye on her tonight, will call 911 or go to ER if deteriorating.  RTC tomorrow for recheck.  if not turning corner, admit.  will need spirometry in future once acute exac resolved.  Will need to quit smoking.  Orders: Albuterol Sulfate Sol 1mg  unit dose (N8295) Atrovent 1mg  (Neb) (A2130) Nebulizer Tx (86578) T-2 View CXR (71020TC) Albuterol Sulfate Sol 1mg  unit dose (I6962) Atrovent 1mg  (Neb) (X5284)  Complete Medication List: 1)  Gabapentin 600 Mg Tabs (Gabapentin) .Marland Kitchen.. 1 by mouth three  times a day 2)  Lisinopril 2.5 Mg Tabs (Lisinopril) .... Oine tab by mouth at night. 3)  Flexeril 10 Mg Tabs (Cyclobenzaprine hcl) .Marland Kitchen.. 1 three times a day as needed / by mouth 4)  Proair Hfa 108 (90 Base) Mcg/act Aers (Albuterol sulfate) .... 2 puffs every  4 hrs as needed wheezing 5)  Hydrochlorothiazide 25 Mg Tabs (Hydrochlorothiazide) .Marland Kitchen.. 1 daily by mouth for swelling 6)  Symbicort 80-4.5 Mcg/act Aero (Budesonide-formoterol fumarate) .... One inh two times a day 7)  Xopenex 0.63 Mg/89ml Nebu (Levalbuterol hcl) .... Use in nebuliozer four times a day as needed for restricted breathing. 8)  Mobic 7.5 Mg Tabs (Meloxicam) .Marland Kitchen.. 1 by mouth two times a day 9)  Prednisone 50 Mg Tabs (Prednisone) .... Take one daily for 10 days 10)  Doxycycline Hyclate 100 Mg Caps (Doxycycline hyclate) .... Take one daily for 10 days 11)  Combivent 18-103 Mcg/act Aero (Ipratropium-albuterol) .... Two puffs four times a day  Patient Instructions: 1)  return tomorrow for recheck. 2)  Continue prednisone 3)  Continue symbicort 4)  Continue doxycycline 5)  START combivent 2 puffs four times a day. 6)  IF having increased shortness of breath despite these measures overnight, or fevers or worsening, please go to ER for  evaluation. Prescriptions: COMBIVENT 18-103 MCG/ACT AERO (IPRATROPIUM-ALBUTEROL) two puffs four times a day  #1 x 3   Entered and Authorized by:   [name]  MD   Signed by:   [name]  MD on 03/12/2010   Method used:   Electronically to        [facility] [phone]* (retail)       [address]       [place], [place]  [address]       Ph: [phone]       Fax: [phone]   RxID:   [id number]   Current Allergies (reviewed today): ! * ZAROXOLYN ! * VICODAN   Medication Administration  Medication # 1:    Medication: Albuterol Sulfate Sol 1mg  unit dose    Diagnosis: CHRONIC OBSTRUCTIVE PULMONARY DISEASE, ACUTE EXACERBATION (ICD-491.21)    Dose: 2.5    Route:  inhaled    Exp Date: 07/29/2010    Lot #: [id number]    Mfr: Nephron    Comments: Per Dr Sharen Hones    Patient tolerated medication without complications    Given by: Selena Batten Dance CMA Duncan Dull) (March 12, 2010 12:05 PM)  Medication # 2:    Medication: Atrovent 1mg  (Neb)    Diagnosis: CHRONIC OBSTRUCTIVE PULMONARY DISEASE, ACUTE EXACERBATION (ICD-491.21)    Dose: 0.5    Route: inhaled    Exp Date: 07/29/2010    Lot #: [id number]    Mfr: Nephron    Comments: Per Dr. Sharen Hones    Patient tolerated medication without complications    Given by: Selena Batten Dance CMA Duncan Dull) (March 12, 2010 12:05 PM)  Medication # 3:    Medication: Albuterol Sulfate Sol 1mg  unit dose    Diagnosis: CHRONIC OBSTRUCTIVE PULMONARY DISEASE, ACUTE EXACERBATION (ICD-491.21)    Dose: 2.5    Route: inhaled    Exp Date: 07/29/2010    Lot #: [id number]    Mfr: Nephron    Comments: Per Dr. Sharen Hones    Patient tolerated medication without complications    Given by: Selena Batten Dance CMA Duncan Dull) (March 12, 2010 12:33 PM)  Medication # 4:    Medication: Atrovent 1mg  (Neb)    Diagnosis: CHRONIC OBSTRUCTIVE PULMONARY DISEASE, ACUTE EXACERBATION (ICD-491.21)    Dose: 0.5    Route: inhaled    Exp Date: 07/29/2010    Lot #: [id number]    Mfr: Nephron    Comments: Per Dr. Sharen Hones    Patient tolerated medication without complications    Given by: Selena Batten Dance CMA Duncan Dull) (March 12, 2010 12:33 PM)  Orders Added: 1)  Albuterol Sulfate Sol 1mg  unit dose [J7613] 2)  Atrovent 1mg  (Neb) [C1660] 3)  Nebulizer Tx [63016] 4)  T-2 View CXR [71020TC] 5)  Albuterol Sulfate Sol 1mg  unit dose [J7613] 6)  Atrovent 1mg  (Neb) [W1093] 7)  Est. Patient Level IV [23557]

## 2010-07-01 NOTE — Letter (Signed)
Summary: Nadara Eaton letter  [place] at [facility]  [address] [place], [place] [address]   Phone: [phone]  Fax: [phone]       01/07/2010 MRN: [id number]  [facility] [address] [place], [place]  [address]  Dear Ms. Andrey Campanile,  [facility] [place], and [facility] announce the retirement of Arta Silence, M.D., from full-time practice at the [facility] office effective November 28, 2009 and his plans of returning part-time.  It is important to Dr. Hetty Ely and to our practice that you understand that [facility] has seven physicians in our office for your health care needs.  We will continue to offer the same exceptional care that you have today.    Dr. Hetty Ely has spoken to many of you about his plans for retirement and returning part-time in the fall.   We will continue to work with you through the transition to schedule appointments for you in the office and meet the high standards that [place] is committed to.   Again, it is with great pleasure that we share the news that Dr. Hetty Ely will return to [facility] at [facility] in October of 2011 with a reduced schedule.    If you have any questions, or would like to request an appointment with one of our physicians, please call us at [phone] and press the option for Scheduling an appointment.  We take pleasure in providing you with excellent patient care and look forward to seeing you at your next office visit.  Our [facility] Physicians are:  Tillman Abide, M.D. Laurita Quint, M.D. Roxy Manns, M.D. Kerby Nora, M.D. Hannah Beat, M.D. Ruthe Mannan, M.D. We proudly welcomed Raechel Ache, M.D. and [name], M.D. to the practice in July/August 2011.  Sincerely,  [facility]

## 2010-07-04 NOTE — Consult Note (Signed)
Summary: [facility]/Dr. [facility]/Dr. Lajoyce Corners   Imported By: Eleonore Chiquito 02/13/2008 14:50:17  _____________________________________________________________________  External Attachment:    Type:   Image     Comment:   External Document

## 2010-07-04 NOTE — Consult Note (Signed)
Summary: [facility]/Consultation Report/Dr. Hirsc  [facility]/Consultation Report/Dr. Phoebe Perch   Imported By: Mickle Asper 01/13/2008 16:11:24  _____________________________________________________________________  External Attachment:    Type:   Image     Comment:   External Document  Appended Document: [facility]/Consultation Report/Dr. Hirsc     Clinical Lists Changes  Problems: Changed problem from History of  BACK PAIN (ICD-724.5) to History of  BACK PAIN (LUMBAR SPINE DZ- [facility]) (ICD-724.5)

## 2010-08-14 LAB — URINALYSIS, ROUTINE W REFLEX MICROSCOPIC
Bilirubin Urine: NEGATIVE
Glucose, UA: NEGATIVE mg/dL
Hgb urine dipstick: NEGATIVE
Ketones, ur: NEGATIVE mg/dL
Nitrite: NEGATIVE
Protein, ur: NEGATIVE mg/dL
Specific Gravity, Urine: 1.012 (ref 1.005–1.030)
Urobilinogen, UA: 0.2 mg/dL (ref 0.0–1.0)
pH: 5.5 (ref 5.0–8.0)

## 2010-08-14 LAB — COMPREHENSIVE METABOLIC PANEL
ALT: 14 U/L (ref 0–35)
AST: 17 U/L (ref 0–37)
Albumin: 3.2 g/dL — ABNORMAL LOW (ref 3.5–5.2)
Alkaline Phosphatase: 91 U/L (ref 39–117)
BUN: 9 mg/dL (ref 6–23)
CO2: 25 mEq/L (ref 19–32)
Calcium: 8.9 mg/dL (ref 8.4–10.5)
Chloride: 104 mEq/L (ref 96–112)
Creatinine, Ser: 0.73 mg/dL (ref 0.4–1.2)
GFR calc Af Amer: >60 mL/min (ref >60)
GFR calc non Af Amer: >60 mL/min (ref >60)
Glucose, Bld: 114 mg/dL — ABNORMAL HIGH (ref 70–99)
Potassium: 3.4 mEq/L — ABNORMAL LOW (ref 3.5–5.1)
Sodium: 136 mEq/L (ref 135–145)
Total Bilirubin: 0.4 mg/dL (ref 0.3–1.2)
Total Protein: 6.6 g/dL (ref 6.0–8.3)

## 2010-08-14 LAB — DIFFERENTIAL
Basophils Absolute: 0 10*3/uL (ref 0.0–0.1)
Basophils Relative: 0 % (ref 0–1)
Eosinophils Absolute: 0.2 10*3/uL (ref 0.0–0.7)
Eosinophils Relative: 2 % (ref 0–5)
Lymphocytes Relative: 29 % (ref 12–46)
Lymphs Abs: 3.3 10*3/uL (ref 0.7–4.0)
Monocytes Absolute: 0.8 10*3/uL (ref 0.1–1.0)
Monocytes Relative: 7 % (ref 3–12)
Neutro Abs: 7.2 10*3/uL (ref 1.7–7.7)
Neutrophils Relative %: 62 % (ref 43–77)

## 2010-08-14 LAB — POCT CARDIAC MARKERS
CKMB, poc: <1 ng/mL — ABNORMAL LOW (ref 1.0–8.0)
Myoglobin, poc: 72.1 ng/mL (ref 12–200)
Troponin i, poc: <0.05 ng/mL (ref 0.00–0.09)

## 2010-08-14 LAB — CBC
HCT: 42.3 % (ref 36.0–46.0)
Hemoglobin: 14.6 g/dL (ref 12.0–15.0)
MCH: 31.1 pg (ref 26.0–34.0)
MCHC: 34.5 g/dL (ref 30.0–36.0)
MCV: 90.2 fL (ref 78.0–100.0)
Platelets: 283 10*3/uL (ref 150–400)
RBC: 4.69 MIL/uL (ref 3.87–5.11)
RDW: 13.2 % (ref 11.5–15.5)
WBC: 11.5 10*3/uL — ABNORMAL HIGH (ref 4.0–10.5)

## 2010-10-14 NOTE — Op Note (Signed)
NAMEMADISSON, Brenda [name]                 ACCOUNT NO.:  [id number]   MEDICAL RECORD NO.:  [id number]          PATIENT TYPE:  INP   LOCATION:  5009                         FACILITY:  [facility]   PHYSICIAN:  [name], MD     DATE OF BIRTH:  [birth year]   DATE OF PROCEDURE:  DATE OF DISCHARGE:  03/06/2008                               OPERATIVE REPORT   PREOPERATIVE DIAGNOSIS:  Unstable dislocated right knee with  tricompartmental osteoarthritis.   POSTOPERATIVE DIAGNOSIS:  Unstable dislocated right knee with  tricompartmental osteoarthritis.   PROCEDURE:  Right knee reconstruction with total knee arthroplasty with  a hinge DePuy knee with a femur, medium femoral implant with a 75 x 14-  mm stem, tibia with a #3 tray with a 75 x 12-mm stem, hinge 16 mm  stabilized poly tray with a 35-mm patella.   SURGEON:  [name], MD   ANESTHESIA:  General plus femoral block.   ESTIMATED BLOOD LOSS:  Minimal.   ANTIBIOTICS:  2 g of Kefzol.   DRAINS:  None.   COMPLICATIONS:  None.   TOURNIQUET TIME:  56 minutes with 300 mmHg.   DISPOSITION:  To PACU in stable condition.   INDICATIONS FOR PROCEDURE:  The patient is a 50 year old woman with  complex instability of her right knee.  She is status post at least 6  surgical procedures in the past with a dislocated knee tricompartmental  osteoarthritis.  She is unable to ambulate.  Pain with activities of  daily living and presents at this time for right total knee  reconstruction.  Risks and benefits of surgery were discussed including  infection, neurovascular injury, persistent pain, and need for  additional surgery.  The patient states, she understands and wish to  proceed at this time.   DESCRIPTION OF PROCEDURE:  The patient was brought to OR room 1.  After  undergoing a foot femoral block, she has then underwent a general  anesthetic.  After adequate level of anesthesia obtained, the patient's  right lower extremity was prepped using  DuraPrep, draped into a sterile  field, and an [place] was used to cover all exposed skin.  An anterior  incision was made and this was carried down with a medial parapatellar  retinacular incision.  Using the IM rod, the tibia was cut just distal  to the wear on the medial side of the tibial plateau.  The tibia was  sequentially broached to a size 14.  Attention was then focused on the  femur.  Using the IM guide for the femur, the femur was cut with a 11 mm  cut off the femur.  The chamfer cuts were made and the femur was  sequentially broached up to a size 14.  This was also sized to accept  the IM implant for the femur.  The femoral trial with the femoral stem  was placed as well as the tibial trial with tibial stem were placed.  This was sized with a size 16-mm hinge knee.  This had stable varus and  valgus, had full extension and full  flexion.  The trial components were  removed.  The patella was resurfaced with 10 mm off the patella.  This  was then drilled for a size 35 patella.  The knee was irrigated with  pulsatile lavage.  Using a stem on the proximal aspect of the tibial  tray, the tibia with the stem was inserted and cemented.  The femur with  the stem also was inserted and cemented and loose cement was removed.  Again, pulsatile lavage was performed.  The hemostat knee poly implant  was placed.  The patella implant was placed and clamped and the knee was  left in extension until the cement had hardened.  Once the cement had  hardened, the clamps were removed.  All loose cement was removed.  The  knee was further irrigated with pulsatile lavage.  The tendon was placed  for the hands brace.  The knee was again placed through a full range of  motion and this was stable.  Due to the patient's significant deformity  of the knee, there was lateral tracking of the patella.  Lateral release  was performed.  The patella tracked midline.  The medial parapatellar  retinacular incision  was closed using #1 Vicryl.  Subcu was closed using  2-0 Vicryl.  Skin was closed using approximate staples.  The wound was  covered with Adaptic orthopedic sponges, ABD dressing, Webril, and  Coban.  The patient was extubated, taken to PACU in stable condition.  Planned for initiation of physical therapy.      [name], MD  Electronically Signed     MVD/MEDQ  D:  03/22/2008  T:  03/22/2008  Job:  [id number]

## 2010-10-14 NOTE — Discharge Summary (Signed)
Brenda [name], Brenda [name]                 ACCOUNT NO.:  [id number]   MEDICAL RECORD NO.:  [id number]          PATIENT TYPE:  INP   LOCATION:  5009                         FACILITY:  [facility]   PHYSICIAN:  [name], MD     DATE OF BIRTH:  [birth year]   DATE OF ADMISSION:  03/22/2008  DATE OF DISCHARGE:  03/27/2008                               DISCHARGE SUMMARY   FINAL DIAGNOSIS:  Unstable osteoarthritis, right knee.   PROCEDURE:  Right total knee arthroplasty with a hinged DePuy totally  constrained component with the femur 75 x 14-mm stem, medium tibial 75 x  12-mm stem, #3 tray hinge, 16-mm poly, and 35-mm patella.   HISTORY OF PRESENT ILLNESS:  The patient is a 50 year old woman with  severe collapse of the right knee with osteoarthritis.  She has had  previous surgical procedures with an unstable knee and presented at this  time for a revision total knee arthroplasty.  The patient's hospital  course was essentially unremarkable.  She underwent a total knee  arthroplasty on March 22, 2008.  She received Kefzol for infection  prophylaxis and was started on Coumadin for DVT prophylaxis.  Tourniquet  time was 56 minutes.  Postoperative day #1, the patient was placed on  knee immobilizer for beginning ambulation.  She progressed slowly with  physical therapy and was started with both physical therapy and  occupational therapy.  Internal Medicine was consulted to evaluate the  patient's electrolyte balance.  Postoperative day #4, temperature max  was 100.3.  Her hemoglobin was stable at 8.8.  Her Foley was  discontinued.  The patient was felt to be safe for discharge to home and  the patient was discharged to home in stable condition with [facility] for [facility] health physical [facility].  Prescriptions for Tylox for  pain and Coumadin for DVT prophylaxis.  [facility] for home  health physical therapy.  Plan to follow up in the office in 2 weeks.      [name], MD  Electronically Signed     MVD/MEDQ  D:  05/10/2008  T:  05/10/2008  Job:  [id number]

## 2010-10-17 NOTE — Assessment & Plan Note (Signed)
[facility]                           GASTROENTEROLOGY OFFICE NOTE   NAME:[name], [name]                        MRN:          [id number]  DATE:01/14/2006                            DOB:          Aug 18, 1960    [name] is a 50 year old white female, here as an acute work-in for  evaluation of acute epigastric pain.  The patient developed acute epigastric  pain radiating around her costal margins to the back about 5 days ago.  The  pain lasted about 3 hours, then subsided completely for a period of 24  hours, and it recurred again within 24 hours, again lasted several hours.  It was rather severe.  The patient had vomiting, but the pain subsided then  returned again 3 days ago, and has remained quite severe since then, being  rather constant all the way to the back and the bilateral scapular areas.  There has been no fever or jaundice.  The patient has not been able to keep  any food down.  The pain is similar to when she had a common bile duct stone  several years ago.  The patient had a cholecystectomy in 1984.  She  subsequently developed a recurrent abdominal pain, and had an ERCP and  apparently stone extraction by Dr. Russella Dar in the 1990s.  She has been well  until recently.  She was seen in the Emergency Room for this pain several  days ago, and at that time her transaminases were elevated as AST 206 and  ALT 250, with alkaline phosphatase of 155 but normal bilirubin, and normal  amylase lipase.  She denies using alcohol.  She has been overweight.   MEDICATIONS:  Her medications include hydrocodone for abdominal pain and  Zofran.  She also was taking Advil for arthritic complaints, but has not  taken it for several days.   PAST MEDICAL HISTORY:  Significant for:  1. High blood pressure.  2. Tubal ligation.   FAMILY HISTORY:  Prostate cancer, heart disease in grandfather and brother.   SOCIAL HISTORY:  Married, has 3 children.  High school  graduate, she is  unemployed at this time.  She smokes three-quarter pack a day, has not drunk  any alcohol.   REVIEW OF SYSTEMS:  She has been overweight, wears glasses.  She has  allergies, arthritic complaints.   PHYSICAL EXAMINATION:  VITAL SIGNS:  Blood pressure 122/76, pulse 66, weight  251 pounds.  GENERAL:  The patient was clearly overweight, distressed, rather restless,  she was very uncomfortable.  HEENT:  Sclerae are nonicteric.  Oral cavity was normal.  LUNGS:  Showed expiratory high-pitched wheezes, no rales or rhonchi.  CARDIAC:  Cor was rapid, S1, S2, no murmur.  ABDOMEN:  Obese with absent bowel sounds and marked tenderness in the  epigastrium and left upper quadrant.  A postcholecystectomy scar in the  right upper quadrant.  The lower abdomen was mildly tender, but no rebound  or mass.  RECTAL:  Exam with Hemoccult negative stool.  EXTREMITIES:  Trace edema.   IMPRESSION:  50 year old white female with acute  upper abdominal pain  consistent with biliary colic.  The pain has subsided twice in the last  several days, but has recurred and has been persistent, which raises the  possibility of pancreatitis, although on upper abdominal ultrasound her  pancreas appears normal.  Her common bile duct is 10 mm, which could be  consistent with postcholecystectomy state.  Her bilirubin is normal, and  there is no clinical evidence of biliary obstruction, but her transaminases  were elevated.   PLAN:  The patient will be admitted for pain control.  She will need an  ERCP, possibly a repeat sphincterotomy and stone extraction or clearance of  the common bile duct.  I will also consider CT scan of the abdomen after the  ERCP.  She will be put on an antibiotic, Unasyn 1.5 gram IV q. 6 hours  tonight, and kept NPO.  I have discussed this with the patient.                                   Hedwig Morton. Juanda Chance, MD   DMB/MedQ  DD:  01/14/2006  DT:  01/15/2006  Job #:  [id number]    cc:   Arta Silence, MD

## 2010-10-17 NOTE — Discharge Summary (Signed)
Brenda [name], Brenda [name]                 ACCOUNT NO.:  [id number]   MEDICAL RECORD NO.:  [id number]          PATIENT TYPE:  INP   LOCATION:  3007                         FACILITY:  [facility]   PHYSICIAN:  Brenda [name], MDDATE OF BIRTH:  [birth year]   DATE OF ADMISSION:  06/11/2006  DATE OF DISCHARGE:  06/18/2006                               DISCHARGE SUMMARY   DISCHARGE DIAGNOSES:  1. Acute asthma exacerbation/probable pneumonia.  2. Left maxillary sinusitis.  3. Steroid-induced diabetes type 2.  4. Left renal atrophy on CT.   HISTORY OF PRESENT ILLNESS:  Brenda [name] is a 50 year old white female  who was admitted on June 11, 2006 with acute asthma exacerbation  which did not respond to outpatient treatment.  She was admitted for IV  Solu-Medrol and IV antibiotics.   PAST MEDICAL HISTORY:  1. Obesity.  2. Asthma.  3. Chronic sinusitis.   HOSPITAL COURSE:  Problem 1.  Acute asthma exacerbation/probable  pneumonia.  The patient was admitted and was placed on Avelox and IV  Solu-Medrol.  Her improvement was very slow.  A pulmonary consult was  obtained, and the patient was seen in consultation by Brenda [name]  of  [facility].  The patient was started on a proton pump inhibitor  as well as Advair.  She required IV Solu-Medrol for 6 days.  She is  currently tolerating p.o. prednisone, with a resolution of her wheezing'   Problem 2.  Left maxillary sinusitis.  The patient complained of left  maxillary pressure and drainage.  A CT of the sinuses was performed  which confirmed a left maxillary sinusitis.  She was seen in  consultation by Brenda [name] of [facility].  She was started on  Nasonex as well as Afrin during this hospitalization, with local warm  compresses to the left maxillary sinus.  The patient reports some  improvement in sinus pressure and drainage.  Per Brenda [name], she  recommended septoplasty and left maxillary antrotomy under general  anesthesia when her  pulmonary status improved, to be done as soon as she  is ready.  If not, she suggested waiting until she is ready, possibly in  4 to 6 weeks and then going ahead, feeling like the main concern will be  that if she does not improve over that period of time that the infection  may actually be keeping her lungs reactive.  Upon discussion with the  pulmonary team, it was recommended that the patient undergo pulmonary  function testing after her exacerbation is resolved and that these  pulmonary function tests be performed prior to any upcoming surgery.  She is scheduled to see Brenda [name] on July 15, 2006 at 10:00 a.m.  for pulmonary function testing and pulmonary consult.   Problem 3.  Diabetes type 2, steroid-induced.  The patient was noted to  have some mild elevation of her CBGs during this hospitalization.  She  was noted to have a normal hemoglobin A1C.   Problem 4.  Left renal atrophy noted on CT.  The patient did undergo a  CT angio of the  chest secondary to rule out PE workup.  It was negative  for pulmonary embolus.  It did show some patchy ground glass opacities.  Incidentally, left renal atrophy was noted on CT, possibly due to  chronic reflux.  She will need outpatient followup with urology.  We  will defer to the patient's primary care.   DISCHARGE MEDICATIONS:  1. Avelox 400 mg p.o. daily for 7 additional days.  2. Protonix 40 mg p.o. daily.  3. Prednisone 60 mg p.o. today and tomorrow and then 50 mg p.o. daily      for 2 days, then 40 mg p.o. daily for 2 days, and then 30 mg p.o.      daily for 2 days, and then 20 mg p.o. daily for 2 days, and then 10      mg p.o. daily for 2 days and then stop.  4. Advair 100/50 one puff twice daily.  5. Nasonex 1 spray each nostril twice daily.  6. Albuterol 2 puffs every 6 hours as needed.   PERTINENT LABORATORIES AT TIME OF DISCHARGE:  ANA negative.  Troponin  0.02.  Hemoglobin 15.3, hematocrit 45.4.   FOLLOW UP:  1. The  patient is to follow up with Brenda [name], nurse practitioner on      Wednesday, June 23, 2006 at 10:00 a.m.  2. She is also to follow up with Dr. Shan Levans on July 15, 2006 at 10 a.m. for PFTs and pulmonary followup.  3. The patient is to follow up with Brenda [name] at [facility] and      contact her office for an appointment.  4. She will also need followup with urology as an outpatient.  Will      defer to the patient's primary care.   CONDITION ON DISCHARGE:  Stable and medically improved.      Brenda Craze, NP      Brenda Rover. Felicity Coyer, MD  Electronically Signed    MO/MEDQ  D:  06/18/2006  T:  06/18/2006  Job:  [id number]   cc:   Billie D. Bean, FNP  Charlcie Cradle. Delford Field, MD, [facility]  Lucky Cowboy, MD

## 2010-10-17 NOTE — Assessment & Plan Note (Signed)
[facility]                             PULMONARY OFFICE NOTE   NAME:[name], [name]                        MRN:          [id number]  DATE:09/08/2006                            DOB:          [birth year]    Brenda [name] is a 50 year old white female with history of asthmatic  bronchitis, early emphysema, airflow obstruction, acute on chronic  sinusitis, active smoking. Pulmonary wise she is improved with decreased  shortness of breath, decreased cough.   She is on the Symbicort 160/4.5 two sprays b.i.d., Nasonex two sprays  each nostril daily.   She is down to three cigarettes a day, but has not fully quit smoking.   PHYSICAL EXAMINATION:  Temperature 97.0, blood pressure 140/94, pulse  100, saturation is 94% on room air.  CHEST: Showed distant breath sounds. Few expired wheezes.  CARDIAC: Showed a regular rate and rhythm without S3. Normal S1, S2.  ABDOMEN: Soft, nontender.  EXTREMITIES: Showed no edema or clubbing.  SKIN: Was clear.   IMPRESSION:  Impression is that of asthmatic bronchitis with chronic  airflow obstruction, improved, with active smoking.   PLAN:  Is to pursue further smoking cessation, maintain Symbicort,  Nasonex as is and return this patient in followup in two months.     Charlcie Cradle Delford Field, MD, [facility]  Electronically Signed    PEW/MedQ  DD: 09/08/2006  DT: 09/08/2006  Job #: [id number]   cc:   Arta Silence, MD

## 2010-10-17 NOTE — Discharge Summary (Signed)
[name], Brenda [name]                 ACCOUNT NO.:  [id number]   MEDICAL RECORD NO.:  [id number]          PATIENT TYPE:  INP   LOCATION:  5023                         FACILITY:  [facility]   PHYSICIAN:  Barbette Hair. Arlyce Dice, MD,FACGDATE OF BIRTH:  05/11/1961   DATE OF ADMISSION:  01/14/2006  DATE OF DISCHARGE:  01/15/2006                                 DISCHARGE SUMMARY   DISCHARGE DIAGNOSES:  1. Abdominal pain with nausea and vomiting.  Symptoms secondary to      cholangitis causing obstruction of the bile duct.  2. Liver function test is elevated.  3. Status post ERCP with findings of prior sphincterotomy.  Pus and a      small amount of blood seen at the ampulla.  Underwent balloon sweep of      the bile duct, following which there was prompt emptying of contrast      dye.  No further sphincterotomy was performed.   PROCEDURES:  ERCP as above by Dr. Arlyce Dice.   CONSULTATIONS:  None.   BRIEF HISTORY:  [name] is a 50 year old white female.  She was seen in  the office on August 16 by Dr. Juanda Chance.  Three days prior she had been  vomiting and having abdominal pain, which initially resolved but recurred.  Pain radiated into the scapular areas.  There was no jaundice; however, she  did have elevated transaminases in the 200s and alkaline phosphatase of 155  with normal bilirubin, amylase and lipase.  The patient has had prior  sphincterotomy and stone extraction in the 1990s, as well as a more remote  cholecystectomy.  Dr. Juanda Chance was concerned that the patient may have  recurrent choledocholithiasis and admitted her for supportive care so that  she could undergo ERCP.   LABORATORY:  Hemoglobin 13.9, hematocrit 41.7, White blood cell count 8.1.  MCV 92.2, platelets 278,000.  PT 14.2, INR 1.1, PTT 37.  Sodium 137,  potassium 3.7, chloride 103, CO2 29, glucose 113, BUN 6, creatinine 1.0.  Total bilirubin 2.7, alkaline phosphatase 205, AST 235, ALT 468, albumin  3.0.  Amylase 39, lipase 21.   TSH 1.87.  Urinalysis showed many bacteria,  many squamous epithelial cells.  She was positive for nitrites and small  amount of leukocyte esterase.  The acute abdominal series with chest showed  nonobstructive bowel gas pattern and no active cardiopulmonary disease.   HOSPITAL COURSE:  At admission the patient was started on empiric IV Unasyn,  as well as p.r.n. Dilaudid, Phenergan and doses of Protonix.  She underwent  ERCP with Dr. Arlyce Dice, with findings noted above.  As to why she developed  the pus and blood/cholangitis is unclear.  She tolerated the ERCP well and  was eating a regular diet within a couple of hours of waking up from  sedation.  She was to go home that afternoon with a prescription for 3 more  days of oral ciprofloxacin, which would cover the infection of the bile duct  as well as possible UTI -- because her urinalysis does show bacteria and  evidence for an UTI.  However,  it is a contaminated and therefore unreliable  specimen.   MEDICATIONS AT DISCHARGE:  1. Albuterol inhaler as needed.  2. Hydrocodone 1-2 q.4-6 h.  as needed.  3. Cipro 500 mg twice daily for 3 days.     ______________________________  Jennye Moccasin, PA-C      Barbette Hair. Arlyce Dice, MD,FACG  Electronically Signed    SG/MEDQ  D:  01/15/2006  T:  01/15/2006  Job:  [id number]

## 2010-10-17 NOTE — Discharge Summary (Signed)
Brenda [name], Brenda [name]                 ACCOUNT NO.:  [id number]   MEDICAL RECORD NO.:  [id number]          PATIENT TYPE:  INP   LOCATION:  5023                         FACILITY:  [facility]   PHYSICIAN:  Barbette Hair. Arlyce Dice, MD,FACGDATE OF BIRTH:  [birth year]   DATE OF ADMISSION:  01/14/2006  DATE OF DISCHARGE:  01/15/2006                                 DISCHARGE SUMMARY   ADMITTING DIAGNOSES:  1. Acute upper abdominal pain with nausea and vomiting.  2. History of status post cholecystectomy, 1984.  3. ERCP apparently was stone extraction in the 1990's, though we do not      have the exact records secondary to recurrent abdominal pain.  4. Osteoarthritis, especially of the right knee.  5. Asthma.  6. High blood pressure.  7. Status post tubal ligation.   NO KNOWN MEDICAL ALLERGIES.   DISCHARGE DIAGNOSIS:  Acute abdominal pain with nausea and vomiting.   Dictation ended at this point.     ______________________________  Jennye Moccasin, PA-C      Barbette Hair. Arlyce Dice, MD,FACG  Electronically Signed    SG/MEDQ  D:  01/15/2006  T:  01/15/2006  Job:  [id number]   cc:   Arta Silence, MD

## 2010-10-17 NOTE — Assessment & Plan Note (Signed)
[name]                             PULMONARY OFFICE NOTE   NAME:Brenda Brenda [name], Brenda [name]                        MRN:          [id number]  DATE:07/29/2006                            DOB:          01-Jan-1961    CHIEF COMPLAINT:  Evaluate asthma.   HISTORY OF PRESENT ILLNESS:  A 50 year old white female has had a 28-  year history of asthma.  Symptoms are that of cough and dyspnea.  Triggers include that of strong smells and exertion.  She has had a 20-  year history of smoking 5 cigarettes daily and continues to do so.  She  is on albuterol as needed.  She has a nebulizer at home with albuterol  in it as well, using this on average of 1 to 2 times daily.  She states,  when she took Advair, it caused soreness in the tongue.  She is short of  breath if she walks quickly.  Occasionally, she is short of breath at  rest.  She has post-nasal drainage, occasional green mucus, some sinus  congestion.  She has frequent sinus infections.  She was hospitalized in  January of this year for a pneumonia.  She is referred now for further  evaluation.  She has no real reflux symptomatology, no abdominal pain,  occasional difficulty swallowing.  Does have headaches and nasal  congestion.  Denies any sneezing, itching, earache, anxiety, depression,  hand or foot swelling.  She is referred for further evaluation.   PAST MEDICAL HISTORY:  History of asthma.  History of chronic allergies.  Chronic headaches.  She has chronic disk disease in the lower lumbar  spine.   OPERATIVE HISTORY:  Cholecystectomy in 1984, knee surgeries x3 on the  right.   MEDICATION ALLERGIES:  None.   CURRENT MEDICATIONS:  1. Tramadol 1 to 2 every 6 hours for pain.  2. Cyclobenzaprine 10 mg q.8.  3. Albuterol as needed.   SOCIAL HISTORY:  Smoking history as noted above.  Lives at home with her  husband.  Works as a Licensed conveyancer at [employer].   FAMILY HISTORY:  Asthma in her  father.  Heart disease in paternal  grandfather.  Maternal grandmother had a stroke.  Paternal grandmother  had breast cancer.   PHYSICAL EXAM:  This is an obese white female in no distress.  Weight 235 pounds, temperature 97.8, blood pressure 132/90, pulse 94,  saturation 96% on room air.  CHEST:  Diminished breath sounds with a few expiratory wheezes.  Poor  air flow.  CARDIAC:  Regular rate and rhythm without S3.  Normal S1, S2.  ABDOMEN:  Soft, nontender.  EXTREMITIES:  No edema, clubbing, or venous disease.  SKIN:  Clear.  Nares showed increased nasal purulence, right greater than left nares.  Oropharynx was clear.  NECK:  Supple.   LABORATORY DATA:  Chest x-ray showed peribronchial thickening.  Pulmonary functions had already been obtained, showed an FEV1 of 64% of  predicted, FVC of 78% of predicted, FEF25-75% at 29% of predicted.  There is a 15% improvement in FEV1 following  albuterol.  Total lung  capacity is at 105%, residual volume at 147% of predicted.  Diffusion  capacity low at 55% of predicted.   IMPRESSION:  Asthmatic bronchitis with early emphysema air trapping,  increased airflow obstruction, acute on chronic sinusitis and active  smoking.   RECOMMENDATIONS:  Begin Symbicort 160/4.5 two sprays b.i.d.  Begin  Nasonex each nostril daily.  Begin Augmentin 2 g b.i.d. for 10 days.  Prednisone pulse 40 mg a day, taper down to 10 mg every 3 days until  off.  Give saline wash 3 times daily for a week, then twice daily  thereafter.  Administer Chantix for smoking cessation.  The patient was  advised as to proper techniques for smoking cessation.  We will see the  patient back in return followup in 1 month.     Charlcie Cradle Brenda Field, MD, [facility]  Electronically Signed    PEW/MedQ  DD: 07/30/2006  DT: 07/30/2006  Job #: [id number]   cc:   Arta Silence, MD

## 2010-10-17 NOTE — Assessment & Plan Note (Signed)
[facility]                           GASTROENTEROLOGY OFFICE NOTE   NAME:WILSONLorijean, Brenda [name]                        MRN:          [id number]  DATE:01/26/2006                            DOB:          11-15-1960    Brenda [name] is a 50 year old white female who recently hospitalized on  emergency basis at [facility] with suspected biliary colic and  pancreatitis.  She has a history of common bile duct stones and underwent  ERCP in the past.  She underwent ERCP by Dr. Arlyce Dice with a sphincterotomy of  a closed-up old sphincterotomy site with complete relief of patient's  symptoms.  He cleaned the bile duct with a balloon but did not see any  define stones.  Nevertheless, patient was much improved.  Patient has done  well in past 2 weeks.  She is back on regular diet.  Her liver functions in  the hospital showed improvement but never resolved completely, so we need to  recheck them today.   PHYSICAL EXAMINATION:  VITAL SIGNS:  Blood pressure 108/84, pulse 64 and  weight 255 pounds.  GENERAL:  She was alert, oriented, in no distress.  LUNGS:  Expiratory wheezes.  COR:  Normal S1, normal S2.  ABDOMEN:  Soft, nontender, obese with normoactive bowel sounds.   IMPRESSION:  1. [age]-year-old white female status post biliary colic, status      post re-ERCP and re-sphincterotomy of the strictured distal common bile      duct.  2. Status post remote cholecystectomy.  3. Suspect low-grade pancreatitis.   PLAN:  1. Patient needs proton pump inhibitor, Protonix 40 mg a day, for next 2      weeks to settle down some of her digestive symptoms most likely related      to low-grade pancreatitis.  2. Repeat liver function test today.  3. Hopefully we do not have to see her in the future.  I would be happy to      see her on emergency basis should she have a repeat attack.                                   Brenda Morton. Juanda Chance, MD   DMB/MedQ  DD:  01/26/2006  DT:  01/27/2006  Job #:  [id number]   cc:   Arta Silence, MD

## 2010-10-20 ENCOUNTER — Other Ambulatory Visit: Payer: Self-pay | Admitting: *Deleted

## 2010-10-20 MED ORDER — IPRATROPIUM-ALBUTEROL 18-103 MCG/ACT IN AERO: 2.0000 | INHALATION_SPRAY | Freq: Four times a day (QID) | RESPIRATORY_TRACT | Status: DC

## 2010-10-23 ENCOUNTER — Other Ambulatory Visit: Payer: Self-pay | Admitting: Family Medicine

## 2010-11-30 HISTORY — PX: OTHER SURGICAL HISTORY: SHX169

## 2010-12-05 ENCOUNTER — Other Ambulatory Visit: Payer: Self-pay | Admitting: Neurosurgery

## 2010-12-05 DIAGNOSIS — M5126 Other intervertebral disc displacement, lumbar region: Secondary | ICD-10-CM

## 2010-12-05 DIAGNOSIS — M4716 Other spondylosis with myelopathy, lumbar region: Secondary | ICD-10-CM

## 2010-12-08 ENCOUNTER — Ambulatory Visit
Admission: RE | Admit: 2010-12-08 | Discharge: 2010-12-08 | Disposition: A | Discharge status: Home / Self Care | Payer: Self-pay | Source: Ambulatory Visit | Attending: Neurosurgery | Admitting: Neurosurgery

## 2010-12-08 DIAGNOSIS — M4716 Other spondylosis with myelopathy, lumbar region: Secondary | ICD-10-CM

## 2010-12-08 DIAGNOSIS — M5126 Other intervertebral disc displacement, lumbar region: Secondary | ICD-10-CM

## 2011-01-12 ENCOUNTER — Encounter: Payer: Self-pay | Admitting: Family Medicine

## 2011-03-02 LAB — COMPREHENSIVE METABOLIC PANEL
ALT: 22
ALT: 12
ALT: 25
AST: 19
AST: 20
AST: 23
Albumin: 3.5
Albumin: 2 — ABNORMAL LOW
Albumin: 3.6
Alkaline Phosphatase: 96
Alkaline Phosphatase: 61
Alkaline Phosphatase: 87
BUN: 11
BUN: 11
BUN: 16
CO2: 31
CO2: 22
CO2: 29
Calcium: 9.4
Calcium: 8.5
Calcium: 9.3
Chloride: 99
Chloride: 101
Chloride: 110
Creatinine, Ser: 0.81
Creatinine, Ser: 0.79
Creatinine, Ser: 0.98
GFR calc Af Amer: >60
GFR calc Af Amer: >60
GFR calc Af Amer: >60
GFR calc non Af Amer: >60
GFR calc non Af Amer: >60
GFR calc non Af Amer: >60
Glucose, Bld: 96
Glucose, Bld: 102 — ABNORMAL HIGH
Glucose, Bld: 106 — ABNORMAL HIGH
Potassium: 3.7
Potassium: 3.7
Potassium: 4.6
Sodium: 138
Sodium: 138
Sodium: 138
Total Bilirubin: 0.6
Total Bilirubin: 0.4
Total Bilirubin: 0.6
Total Protein: 7.1
Total Protein: 5.1 — ABNORMAL LOW
Total Protein: 7.4

## 2011-03-02 LAB — CBC
HCT: 42
HCT: 24.2 — ABNORMAL LOW
HCT: 25.5 — ABNORMAL LOW
HCT: 26.4 — ABNORMAL LOW
HCT: 27 — ABNORMAL LOW
HCT: 30.9 — ABNORMAL LOW
HCT: 43.4
Hemoglobin: 14.4
Hemoglobin: 10.4 — ABNORMAL LOW
Hemoglobin: 14.5
Hemoglobin: 8.3 — ABNORMAL LOW
Hemoglobin: 8.8 — ABNORMAL LOW
Hemoglobin: 9.1 — ABNORMAL LOW
Hemoglobin: 9.2 — ABNORMAL LOW
MCHC: 34.4
MCHC: 33.4
MCHC: 33.5
MCHC: 33.6
MCHC: 34.2
MCHC: 34.4
MCHC: 34.8
MCV: 92.3
MCV: 93
MCV: 93.7
MCV: 94.2
MCV: 94.3
MCV: 94.5
MCV: 95
Platelets: 320
Platelets: 189
Platelets: 193
Platelets: 203
Platelets: 216
Platelets: 250
Platelets: 353
RBC: 4.55
RBC: 2.57 — ABNORMAL LOW
RBC: 2.74 — ABNORMAL LOW
RBC: 2.82 — ABNORMAL LOW
RBC: 2.86 — ABNORMAL LOW
RBC: 3.25 — ABNORMAL LOW
RBC: 4.61
RDW: 12.9
RDW: 13.2
RDW: 13.4
RDW: 13.4
RDW: 13.5
RDW: 13.6
RDW: 13.8
WBC: 14 — ABNORMAL HIGH
WBC: 11.6 — ABNORMAL HIGH
WBC: 13.1 — ABNORMAL HIGH
WBC: 14.4 — ABNORMAL HIGH
WBC: 15.2 — ABNORMAL HIGH
WBC: 16 — ABNORMAL HIGH
WBC: 18 — ABNORMAL HIGH

## 2011-03-02 LAB — DIFFERENTIAL
Basophils Absolute: 0
Basophils Relative: 0
Eosinophils Absolute: 0.1
Eosinophils Relative: 1
Lymphocytes Relative: 11 — ABNORMAL LOW
Lymphs Abs: 1.6
Monocytes Absolute: 1
Monocytes Relative: 7
Neutro Abs: 11.7 — ABNORMAL HIGH
Neutrophils Relative %: 81 — ABNORMAL HIGH

## 2011-03-02 LAB — CULTURE, BLOOD (ROUTINE X 2)
Culture: NO GROWTH
Culture: NO GROWTH

## 2011-03-02 LAB — BASIC METABOLIC PANEL
BUN: 14
BUN: 31 — ABNORMAL HIGH
BUN: 9
CO2: 23
CO2: 24
CO2: 26
Calcium: 7.4 — ABNORMAL LOW
Calcium: 7.8 — ABNORMAL LOW
Calcium: 9
Chloride: 108
Chloride: 95 — ABNORMAL LOW
Chloride: 97
Creatinine, Ser: 0.79
Creatinine, Ser: 1.48 — ABNORMAL HIGH
Creatinine, Ser: 2.46 — ABNORMAL HIGH
GFR calc Af Amer: 25 — ABNORMAL LOW
GFR calc Af Amer: 45 — ABNORMAL LOW
GFR calc Af Amer: >60
GFR calc non Af Amer: 21 — ABNORMAL LOW
GFR calc non Af Amer: 37 — ABNORMAL LOW
GFR calc non Af Amer: >60
Glucose, Bld: 105 — ABNORMAL HIGH
Glucose, Bld: 109 — ABNORMAL HIGH
Glucose, Bld: 114 — ABNORMAL HIGH
Potassium: 4.1
Potassium: 4.2
Potassium: 4.6
Sodium: 125 — ABNORMAL LOW
Sodium: 130 — ABNORMAL LOW
Sodium: 138

## 2011-03-02 LAB — CORTISOL-AM, BLOOD: Cortisol - AM: 15

## 2011-03-02 LAB — PROTIME-INR
INR: 0.9
INR: 0.9
INR: 1.2
INR: 1.5
INR: 1.6 — ABNORMAL HIGH
INR: 1.9 — ABNORMAL HIGH
INR: 2.2 — ABNORMAL HIGH
INR: 2.6 — ABNORMAL HIGH
Prothrombin Time: 12.5
Prothrombin Time: 12.4
Prothrombin Time: 15.3 — ABNORMAL HIGH
Prothrombin Time: 19 — ABNORMAL HIGH
Prothrombin Time: 19.3 — ABNORMAL HIGH
Prothrombin Time: 23.3 — ABNORMAL HIGH
Prothrombin Time: 26.2 — ABNORMAL HIGH
Prothrombin Time: 29.3 — ABNORMAL HIGH

## 2011-03-02 LAB — URINE MICROSCOPIC-ADD ON

## 2011-03-02 LAB — URINALYSIS, ROUTINE W REFLEX MICROSCOPIC
Bilirubin Urine: NEGATIVE
Glucose, UA: NEGATIVE
Ketones, ur: NEGATIVE
Leukocytes, UA: NEGATIVE
Nitrite: NEGATIVE
Protein, ur: NEGATIVE
Specific Gravity, Urine: 1.01
Urobilinogen, UA: 0.2
pH: 6

## 2011-03-02 LAB — APTT
aPTT: 29
aPTT: 28

## 2011-03-02 LAB — B-NATRIURETIC PEPTIDE (CONVERTED LAB): Pro B Natriuretic peptide (BNP): 154 — ABNORMAL HIGH

## 2011-03-02 LAB — TYPE AND SCREEN
ABO/RH(D): A POS
Antibody Screen: NEGATIVE

## 2011-03-02 LAB — HEMOGLOBIN: Hemoglobin: 8.6 — ABNORMAL LOW

## 2011-03-02 LAB — ABO/RH: ABO/RH(D): A POS

## 2011-03-02 LAB — TSH: TSH: 0.412

## 2011-05-06 ENCOUNTER — Encounter: Payer: Self-pay | Admitting: Family Medicine

## 2011-05-06 ENCOUNTER — Ambulatory Visit (INDEPENDENT_AMBULATORY_CARE_PROVIDER_SITE_OTHER): Payer: 59 | Admitting: Family Medicine

## 2011-05-06 ENCOUNTER — Other Ambulatory Visit: Payer: Self-pay | Admitting: *Deleted

## 2011-05-06 DIAGNOSIS — J441 Chronic obstructive pulmonary disease with (acute) exacerbation: Secondary | ICD-10-CM

## 2011-05-06 MED ORDER — PREDNISONE 5 MG PO KIT: PACK | ORAL | Status: DC

## 2011-05-06 MED ORDER — IPRATROPIUM-ALBUTEROL 18-103 MCG/ACT IN AERO: 2.0000 | INHALATION_SPRAY | Freq: Four times a day (QID) | RESPIRATORY_TRACT | Status: DC

## 2011-05-06 MED ORDER — DOXYCYCLINE HYCLATE 100 MG PO TABS: 100.0000 mg | ORAL_TABLET | Freq: Two times a day (BID) | ORAL | Status: DC

## 2011-05-06 NOTE — Assessment & Plan Note (Signed)
Continue combivent, start abx and pred with GI caution, stop smoking and then f/u w/PMD.  If SOB, then to ER.  She understands.  Okay for outpatient f/u.  Nontoxic.

## 2011-05-06 NOTE — Progress Notes (Signed)
duration of symptoms: 3 days Rhinorrhea:yes congestion:yes ear pain:no sore throat:yes Cough: yes, with putum myalgias:yes other concerns:no fevers but had some chills Wheeze, more than normal.   Smoking, but less than prev (now 1/2 ppd).   Off some of her prev inhalers.   We talked about f/u with PMD.   ROS: See HPI.  Otherwise negative.    Meds, vitals, and allergies reviewed.   GEN: nad, alert and oriented HEENT: mucous membranes moist, TM w/o erythema, nasal epithelium injected, OP with cobblestoning NECK: supple w/o LA CV: rrr. PULM: no inc wob but prolonged exp phase with exp wheezes ABD: soft, +bs EXT: no edema

## 2011-05-06 NOTE — Patient Instructions (Signed)
Take the antibiotics twice a day, take the sterapred as directed with food, and then schedule a physical with Dr. Reece Agar.  If you get profoundly short of breath, then go to the ER.  Take care.

## 2011-05-29 ENCOUNTER — Other Ambulatory Visit: Payer: Self-pay | Admitting: Family Medicine

## 2011-06-15 ENCOUNTER — Telehealth: Payer: Self-pay | Admitting: Internal Medicine

## 2011-06-15 ENCOUNTER — Other Ambulatory Visit: Payer: Self-pay | Admitting: Internal Medicine

## 2011-06-15 MED ORDER — IPRATROPIUM-ALBUTEROL 18-103 MCG/ACT IN AERO: 2.0000 | INHALATION_SPRAY | Freq: Four times a day (QID) | RESPIRATORY_TRACT | Status: DC

## 2011-06-15 NOTE — Telephone Encounter (Signed)
Rx went to pharmacy

## 2011-06-15 NOTE — Telephone Encounter (Signed)
Received confirmation on Prior approval through insurance on her Combivent through 1.14.14

## 2011-06-28 ENCOUNTER — Other Ambulatory Visit: Payer: Self-pay | Admitting: Family Medicine

## 2011-08-19 ENCOUNTER — Encounter: Payer: Self-pay | Admitting: Family Medicine

## 2011-08-19 ENCOUNTER — Ambulatory Visit (INDEPENDENT_AMBULATORY_CARE_PROVIDER_SITE_OTHER): Payer: 59 | Admitting: Family Medicine

## 2011-08-19 VITALS — BP 130/82 | HR 91 | Temp 98.7°F | Wt 254.0 lb

## 2011-08-19 DIAGNOSIS — J441 Chronic obstructive pulmonary disease with (acute) exacerbation: Secondary | ICD-10-CM

## 2011-08-19 MED ORDER — DOXYCYCLINE HYCLATE 100 MG PO TABS: 100.0000 mg | ORAL_TABLET | Freq: Two times a day (BID) | ORAL | Status: AC

## 2011-08-19 MED ORDER — PREDNISONE 5 MG PO KIT: PACK | ORAL | Status: DC

## 2011-08-19 MED ORDER — BUDESONIDE-FORMOTEROL FUMARATE 80-4.5 MCG/ACT IN AERO: 2.0000 | INHALATION_SPRAY | Freq: Two times a day (BID) | RESPIRATORY_TRACT | Status: DC

## 2011-08-19 MED ORDER — ALBUTEROL SULFATE HFA 108 (90 BASE) MCG/ACT IN AERS: 2.0000 | INHALATION_SPRAY | RESPIRATORY_TRACT | Status: DC | PRN

## 2011-08-19 NOTE — Patient Instructions (Signed)
Start back on the symbicort, use the albuterol as needed, take the prednisone with food and start the antibiotics. This will get worse if you keep smoking.

## 2011-08-19 NOTE — Progress Notes (Signed)
COPD.  Inc in wheeze last few days.  Still smoking, <1/2 PPD.  Coughing.  Occ sputum, increased recently.  No fevers.  Never f/u'd with PMD.  We discussed.   Meds, vitals, and allergies reviewed.   ROS: See HPI.  Otherwise, noncontributory.  nad ncat Tm wnl  OP wnl rrr Diffuse exp wheeze, no focal dec in BS, wheeze improved with neb tx of SABA abd soft, not ttp Ext w/o edema

## 2011-08-19 NOTE — Assessment & Plan Note (Signed)
Start doxy, prednisone.  Gi caution given.  Restart inhalers and quit smoking.  This will get worse if she continues to smoke. Discussed. She understood. Support to stop smoking offered.

## 2011-08-21 MED ORDER — ALBUTEROL SULFATE (2.5 MG/3ML) 0.083% IN NEBU: 2.5000 mg | INHALATION_SOLUTION | Freq: Once | RESPIRATORY_TRACT | Status: DC

## 2011-08-21 MED ORDER — ALBUTEROL SULFATE (2.5 MG/3ML) 0.083% IN NEBU: 2.5000 mg | INHALATION_SOLUTION | Freq: Four times a day (QID) | RESPIRATORY_TRACT | Status: DC | PRN

## 2011-08-21 MED ORDER — ALBUTEROL SULFATE (2.5 MG/3ML) 0.083% IN NEBU
2.5000 mg | INHALATION_SOLUTION | Freq: Once | RESPIRATORY_TRACT | Status: AC
  Administered 2011-08-19: 2.5 mg via RESPIRATORY_TRACT

## 2011-08-21 NOTE — Progress Notes (Signed)
Addended by: Annamarie Major on: 08/21/2011 02:16 PM   Modules accepted: Orders

## 2011-08-21 NOTE — Progress Notes (Signed)
Addended by: Annamarie Major on: 08/21/2011 01:13 PM   Modules accepted: Orders

## 2011-08-28 ENCOUNTER — Other Ambulatory Visit: Payer: Self-pay | Admitting: Family Medicine

## 2011-10-04 ENCOUNTER — Encounter: Payer: Self-pay | Admitting: Family Medicine

## 2011-10-12 ENCOUNTER — Encounter: Payer: Self-pay | Admitting: Family Medicine

## 2011-12-25 ENCOUNTER — Other Ambulatory Visit: Payer: Self-pay | Admitting: Family Medicine

## 2012-01-01 ENCOUNTER — Other Ambulatory Visit: Payer: Self-pay | Admitting: Family Medicine

## 2012-02-02 ENCOUNTER — Ambulatory Visit (INDEPENDENT_AMBULATORY_CARE_PROVIDER_SITE_OTHER): Payer: 59 | Admitting: Family Medicine

## 2012-02-02 ENCOUNTER — Encounter: Payer: Self-pay | Admitting: Family Medicine

## 2012-02-02 VITALS — BP 124/78 | HR 84 | Temp 98.2°F | Wt 255.8 lb

## 2012-02-02 DIAGNOSIS — S81859A Open bite, unspecified lower leg, initial encounter: Secondary | ICD-10-CM | POA: Insufficient documentation

## 2012-02-02 DIAGNOSIS — S81009A Unspecified open wound, unspecified knee, initial encounter: Secondary | ICD-10-CM

## 2012-02-02 DIAGNOSIS — T148XXA Other injury of unspecified body region, initial encounter: Secondary | ICD-10-CM

## 2012-02-02 DIAGNOSIS — IMO0001 Reserved for inherently not codable concepts without codable children: Secondary | ICD-10-CM

## 2012-02-02 DIAGNOSIS — W5501XA Bitten by cat, initial encounter: Secondary | ICD-10-CM | POA: Insufficient documentation

## 2012-02-02 MED ORDER — AMOXICILLIN-POT CLAVULANATE 875-125 MG PO TABS: 1.0000 | ORAL_TABLET | Freq: Two times a day (BID) | ORAL | Status: AC

## 2012-02-02 NOTE — Progress Notes (Signed)
  Subjective:    Patient ID: Brenda [name], female    DOB: [birth year], 51 y.o.   MRN: [id number]  HPI CC: cat bite  Her home cat bit her.  He stays inside.  She accidentally closed door on his tail so he bit her right lateral lower leg.  Washed with peroxide and dressed with neosporin.  Sore to touch, mildly warm.    No fevers/chills, abd pain, nausea.  2 new pugs at home - cat more stressed recently.  Cat otherwise acting himself.  Review of Systems Per HPI    Objective:   Physical Exam WDWN CF obese, NAD Right lateral lower leg with 2 puncture marks about 2 cm apart as well as mild surrounding erythema/calor.  Area delineated.    Assessment & Plan:

## 2012-02-02 NOTE — Assessment & Plan Note (Signed)
Happened yesterday.  Mild erythema and warmth. Treat with ppx abx. Discussed red flags to return or seek urgent care.

## 2012-02-02 NOTE — Patient Instructions (Signed)
Take augmentin twice daily for 7 days. Return if needed, any worsening (draining pus, worsening swelling, redness, pain) or not improving as expected  Animal Bite An animal bite can result in a scratch on the skin, deep open cut, puncture of the skin, crush injury, or tearing away of the skin or a body part. Dogs are responsible for most animal bites. Children are bitten more often than adults. An animal bite can range from very mild to more serious. A small bite from your house pet is no cause for alarm. However, some animal bites can become infected or injure a bone or other tissue. You must seek medical care if:  The skin is broken and bleeding does not slow down or stop after 15 minutes.   The puncture is deep and difficult to clean (such as a cat bite).   Pain, warmth, redness, or pus develops around the wound.   The bite is from a stray animal or rodent. There may be a risk of rabies infection.   The bite is from a snake, raccoon, skunk, fox, coyote, or bat. There may be a risk of rabies infection.   The person bitten has a chronic illness such as diabetes, liver disease, or cancer, or the person takes medicine that lowers the immune system.   There is concern about the location and severity of the bite.  It is important to clean and protect an animal bite wound right away to prevent infection. Follow these steps:  Clean the wound with plenty of water and soap.   Apply an antibiotic cream.   Apply gentle pressure over the wound with a clean towel or gauze to slow or stop bleeding.   Elevate the affected area above the heart to help stop any bleeding.   Seek medical care. Getting medical care within 8 hours of the animal bite leads to the best possible outcome.  DIAGNOSIS  Your caregiver will most likely:  Take a detailed history of the animal and the bite injury.   Perform a wound exam.   Take your medical history.  Blood tests or X-rays may be performed. Sometimes,  infected bite wounds are cultured and sent to a lab to identify the infectious bacteria.  TREATMENT  Medical treatment will depend on the location and type of animal bite as well as the patient's medical history. Treatment may include:  Wound care, such as cleaning and flushing the wound with saline solution, bandaging, and elevating the affected area.   Antibiotics.   Tetanus immunization.   Rabies immunization.   Leaving the wound open to heal. This is often done with animal bites, due to the high risk of infection. However, in certain cases, wound closure with stitches, wound adhesive, skin adhesive strips, or staples may be used.  Infected bites that are left untreated may require intravenous (IV) antibiotics and surgical treatment in the hospital. HOME CARE INSTRUCTIONS  Follow your caregiver's instructions for wound care.   Take all medicines as directed.   If your caregiver prescribes antibiotics, take them as directed. Finish them even if you start to feel better.   Follow up with your caregiver for further exams or immunizations as directed.  You may need a tetanus shot if:  You cannot remember when you had your last tetanus shot.   You have never had a tetanus shot.   The injury broke your skin.  If you get a tetanus shot, your arm may swell, get red, and feel warm to the  touch. This is common and not a problem. If you need a tetanus shot and you choose not to have one, there is a rare chance of getting tetanus. Sickness from tetanus can be serious. SEEK MEDICAL CARE IF:  You notice warmth, redness, soreness, swelling, pus discharge, or a bad smell coming from the wound.   You have a red line on the skin coming from the wound.   You have a fever, chills, or a general ill feeling.   You have nausea or vomiting.   You have continued or worsening pain.   You have trouble moving the injured part.   You have other questions or concerns.  MAKE SURE  YOU:  Understand these instructions.   Will watch your condition.   Will get help right away if you are not doing well or get worse.  Document Released: 02/03/2011 Document Revised: 05/07/2011 Document Reviewed: 02/03/2011 [facility] Patient Information 2012 [place], [place].

## 2012-02-12 ENCOUNTER — Other Ambulatory Visit: Payer: Self-pay | Admitting: *Deleted

## 2012-02-12 MED ORDER — ALBUTEROL SULFATE HFA 108 (90 BASE) MCG/ACT IN AERS: 2.0000 | INHALATION_SPRAY | RESPIRATORY_TRACT | Status: DC | PRN

## 2012-02-18 ENCOUNTER — Other Ambulatory Visit: Payer: Self-pay | Admitting: *Deleted

## 2012-05-01 DIAGNOSIS — Z87448 Personal history of other diseases of urinary system: Secondary | ICD-10-CM

## 2012-05-01 HISTORY — DX: Personal history of other diseases of urinary system: Z87.448

## 2012-05-05 ENCOUNTER — Encounter: Payer: Self-pay | Admitting: Family Medicine

## 2012-05-05 ENCOUNTER — Ambulatory Visit (INDEPENDENT_AMBULATORY_CARE_PROVIDER_SITE_OTHER): Payer: 59 | Admitting: Family Medicine

## 2012-05-05 VITALS — BP 120/70 | HR 98 | Temp 98.6°F | Wt 246.0 lb

## 2012-05-05 DIAGNOSIS — N12 Tubulo-interstitial nephritis, not specified as acute or chronic: Secondary | ICD-10-CM | POA: Insufficient documentation

## 2012-05-05 DIAGNOSIS — R300 Dysuria: Secondary | ICD-10-CM

## 2012-05-05 DIAGNOSIS — R109 Unspecified abdominal pain: Secondary | ICD-10-CM

## 2012-05-05 LAB — POCT URINALYSIS DIPSTICK
Bilirubin, UA: NEGATIVE
Glucose, UA: NEGATIVE
Ketones, UA: NEGATIVE
Nitrite, UA: NEGATIVE
Spec Grav, UA: <=1.005
Urobilinogen, UA: NEGATIVE
pH, UA: 6.5

## 2012-05-05 LAB — COMPREHENSIVE METABOLIC PANEL
ALT: 20 U/L (ref 0–35)
AST: 16 U/L (ref 0–37)
Albumin: 3.2 g/dL — ABNORMAL LOW (ref 3.5–5.2)
Alkaline Phosphatase: 122 U/L — ABNORMAL HIGH (ref 39–117)
BUN: 11 mg/dL (ref 6–23)
CO2: 29 mEq/L (ref 19–32)
Calcium: 8.9 mg/dL (ref 8.4–10.5)
Chloride: 102 mEq/L (ref 96–112)
Creatinine, Ser: 0.9 mg/dL (ref 0.4–1.2)
GFR: 71.22 mL/min (ref >60.00)
Glucose, Bld: 101 mg/dL — ABNORMAL HIGH (ref 70–99)
Potassium: 4.3 mEq/L (ref 3.5–5.1)
Sodium: 136 mEq/L (ref 135–145)
Total Bilirubin: 0.5 mg/dL (ref 0.3–1.2)
Total Protein: 7.7 g/dL (ref 6.0–8.3)

## 2012-05-05 LAB — CBC WITH DIFFERENTIAL/PLATELET
Basophils Absolute: 0 10*3/uL (ref 0.0–0.1)
Basophils Relative: 0.2 % (ref 0.0–3.0)
Eosinophils Absolute: 0.2 10*3/uL (ref 0.0–0.7)
Eosinophils Relative: 1 % (ref 0.0–5.0)
HCT: 42.1 % (ref 36.0–46.0)
Hemoglobin: 13.8 g/dL (ref 12.0–15.0)
Lymphocytes Relative: 14.2 % (ref 12.0–46.0)
Lymphs Abs: 2.5 10*3/uL (ref 0.7–4.0)
MCHC: 32.7 g/dL (ref 30.0–36.0)
MCV: 92.4 fl (ref 78.0–100.0)
Monocytes Absolute: 1.9 10*3/uL — ABNORMAL HIGH (ref 0.1–1.0)
Monocytes Relative: 10.6 % (ref 3.0–12.0)
Neutro Abs: 13 10*3/uL — ABNORMAL HIGH (ref 1.4–7.7)
Neutrophils Relative %: 74 % (ref 43.0–77.0)
Platelets: 344 10*3/uL (ref 150.0–400.0)
RBC: 4.55 Mil/uL (ref 3.87–5.11)
RDW: 13.4 % (ref 11.5–14.6)
WBC: 17.6 10*3/uL — ABNORMAL HIGH (ref 4.5–10.5)

## 2012-05-05 MED ORDER — PROMETHAZINE HCL 25 MG PO TABS
25.0000 mg | ORAL_TABLET | Freq: Three times a day (TID) | ORAL | Status: DC | PRN
Start: 2012-05-05 — End: 2013-02-24

## 2012-05-05 MED ORDER — NAPROXEN 500 MG PO TABS: ORAL_TABLET | ORAL | Status: DC

## 2012-05-05 MED ORDER — CIPROFLOXACIN HCL 500 MG PO TABS: 500.0000 mg | ORAL_TABLET | Freq: Two times a day (BID) | ORAL | Status: DC

## 2012-05-05 MED ORDER — CEFTRIAXONE SODIUM 1 G IJ SOLR
1.0000 g | Freq: Once | INTRAMUSCULAR | Status: AC
  Administered 2012-05-05: 1 g via INTRAMUSCULAR

## 2012-05-05 MED ORDER — PROMETHAZINE HCL 25 MG/ML IJ SOLN
25.0000 mg | Freq: Once | INTRAMUSCULAR | Status: AC
  Administered 2012-05-05: 25 mg via INTRAMUSCULAR

## 2012-05-05 NOTE — Patient Instructions (Addendum)
I'm worried about kidney infection, and possible stone. Urine culture sent today.  Blood work today. Treated today with shot of antibiotic as well as nausea medicine. Treat pain with naprosyn - anti inflammatory. Treat nausea with phenergan as needed. Start cipro 500mg  twice daily for 10 days. If not improving in 2-3 days or if any worsening, please let me know for CT scan or go to ER. Strain urine - strainer provided today.  Pyelonephritis, Adult Pyelonephritis is a kidney infection. In general, there are 2 main types of pyelonephritis:  Infections that come on quickly without any warning (acute pyelonephritis).  Infections that persist for a long period of time (chronic pyelonephritis). CAUSES  Two main causes of pyelonephritis are:  Bacteria traveling from the bladder to the kidney. This is a problem especially in pregnant women. The urine in the bladder can become filled with bacteria from multiple causes, including:  Inflammation of the prostate gland (prostatitis).  Sexual intercourse in females.  Bladder infection (cystitis).  Bacteria traveling from the bloodstream to the tissue part of the kidney. Problems that may increase your risk of getting a kidney infection include:  Diabetes.  Kidney stones or bladder stones.  Cancer.  Catheters placed in the bladder.  Other abnormalities of the kidney or ureter. SYMPTOMS   Abdominal pain.  Pain in the side or flank area.  Fever.  Chills.  Upset stomach.  Blood in the urine (dark urine).  Frequent urination.  Strong or persistent urge to urinate.  Burning or stinging when urinating. DIAGNOSIS  Your caregiver may diagnose your kidney infection based on your symptoms. A urine sample may also be taken. TREATMENT  In general, treatment depends on how severe the infection is.   If the infection is mild and caught early, your caregiver may treat you with oral antibiotics and send you home.  If the infection is  more severe, the bacteria may have gotten into the bloodstream. This will require intravenous (IV) antibiotics and a hospital stay. Symptoms may include:  High fever.  Severe flank pain.  Shaking chills.  Even after a hospital stay, your caregiver may require you to be on oral antibiotics for a period of time.  Other treatments may be required depending upon the cause of the infection. HOME CARE INSTRUCTIONS   Take your antibiotics as directed. Finish them even if you start to feel better.  Make an appointment to have your urine checked to make sure the infection is gone.  Drink enough fluids to keep your urine clear or pale yellow.  Take medicines for the bladder if you have urgency and frequency of urination as directed by your caregiver. SEEK IMMEDIATE MEDICAL CARE IF:   You have a fever or persistent symptoms for more than 2-3 days.  You have a fever and your symptoms suddenly get worse.  You are unable to take your antibiotics or fluids.  You develop shaking chills.  You experience extreme weakness or fainting.  There is no improvement after 2 days of treatment. MAKE SURE YOU:  Understand these instructions.  Will watch your condition.  Will get help right away if you are not doing well or get worse. Document Released: 05/18/2005 Document Revised: 11/17/2011 Document Reviewed: 10/22/2010 [facility] Patient Information 2013 [place], [place].

## 2012-05-05 NOTE — Assessment & Plan Note (Signed)
Concern for kidney stone complicated by pyelonephritis in 5+ d h/o flank pain with fever to 102 last night. Today nontoxic, stable exam, deemed ok for trial of outpt management.   Discussed in detail reasons to seek ER care.  If not improving in 1-2 days, low threshold to obtain CT scan tomorrow w/ w/o contrast to eval for stone/pyelo.  Check Cr today. May use naprosyn for pain. cipro 10d course for pyelo. Phenergan for nausea. See pt instructions for plan.  Pt agrees with plan. Strainer provided today.

## 2012-05-05 NOTE — Progress Notes (Signed)
  Subjective:    Patient ID: Brenda [name], female    DOB: 05-21-1961, 51 y.o.   MRN: [id number]  HPI CC: L flank pain  L flank pain present for last several months, now worsening pain for last 5 days, radiation to side of abdomen and down into groin.  Also feeling pressure with voiding and dysuria.   Intermittent urgency.  Tmax 102 yesterday.  Fever for last 5 days.  + nausea but no vomiting.  Also having sinus congestion for last 2 days, mild cough.  No frequency.  No hematuria.  No vomiting.  No recent UTI.  No h/o kidney stones  Past Medical History  Diagnosis Date  . Chronic back pain     multilevel spondylitic changes, scoliosis, and anterolisthesis L4 not thought to currently be surg candidate (Dr. Phoebe Perch, [facility]) (Dr. Ollen Bowl, [place])  . Bronchitis, chronic obstructive 2008 & 2009    FeV1 64% TLC 105% DLCO 55% 2008  -  FeV1 81% FeF 25-75 43% 2009  . Nonspecific abnormal electrocardiogram (ECG) (EKG)   . Leukocytosis, unspecified   . Essential hypertension, benign   . Hx of migraines   . Bipolar I disorder, most recent episode (or current) manic, unspecified   . Pure hyperglyceridemia   . Acute pancreatitis   . Emphysema of lung   . Nicotine addiction   . Asthma   . Obesity   . Swelling of limb   . Knee pain, right   . Suicide attempt 06/2001  . COPD (chronic obstructive pulmonary disease)      Review of Systems Per HPI    Objective:   Physical Exam  Nursing note and vitals reviewed. Constitutional: She appears well-developed and well-nourished. No distress.       obese  HENT:  Mouth/Throat: Oropharynx is clear and moist. No oropharyngeal exudate.       congested  Abdominal: Soft. Bowel sounds are normal. She exhibits no distension and no mass. There is no hepatosplenomegaly. There is tenderness in the right upper quadrant, suprapubic area and left lower quadrant. There is CVA tenderness (Left). There is no rebound and no guarding.  Skin: Skin is warm and dry. No  rash noted.  Psychiatric: She has a normal mood and affect.      Assessment & Plan:

## 2012-05-05 NOTE — Addendum Note (Signed)
Addended by: Patience Musca on: 05/05/2012 01:34 PM   Modules accepted: Orders

## 2012-05-08 LAB — URINE CULTURE: Colony Count: >=100000

## 2012-05-10 ENCOUNTER — Encounter: Payer: Self-pay | Admitting: Family Medicine

## 2012-06-20 ENCOUNTER — Ambulatory Visit (INDEPENDENT_AMBULATORY_CARE_PROVIDER_SITE_OTHER): Payer: 59 | Admitting: Family Medicine

## 2012-06-20 ENCOUNTER — Encounter: Payer: Self-pay | Admitting: Family Medicine

## 2012-06-20 ENCOUNTER — Encounter: Payer: Self-pay | Admitting: *Deleted

## 2012-06-20 VITALS — BP 128/76 | HR 84 | Temp 98.3°F | Wt 246.8 lb

## 2012-06-20 DIAGNOSIS — A084 Viral intestinal infection, unspecified: Secondary | ICD-10-CM

## 2012-06-20 DIAGNOSIS — A088 Other specified intestinal infections: Secondary | ICD-10-CM

## 2012-06-20 DIAGNOSIS — J019 Acute sinusitis, unspecified: Secondary | ICD-10-CM | POA: Insufficient documentation

## 2012-06-20 MED ORDER — AMOXICILLIN-POT CLAVULANATE 875-125 MG PO TABS: 1.0000 | ORAL_TABLET | Freq: Two times a day (BID) | ORAL | Status: AC

## 2012-06-20 NOTE — Assessment & Plan Note (Signed)
Going on 2+ wks.  Given duration and progression, will treat as bacterial sinusitis, however given concommitant viral gastroenteritis, did suggest she hold abx until abd issues resolving.  rec take with yogurt. See pt instructions for further symptomatic relief.

## 2012-06-20 NOTE — Assessment & Plan Note (Signed)
With positive exposures at work. Anticipate viral gastroenteritis - discussed this as well as supportive care as per instructions. Bland diet, continue phenergan, lots of fluids. Discussed things to watch for concerning for dehydration. Suggested she take yogurt when she starts antibiotic for above sinusitis.

## 2012-06-20 NOTE — Progress Notes (Signed)
  Subjective:    Patient ID: Brenda [name], female    DOB: 06/09/1960, 52 y.o.   MRN: [id number]  HPI CC: congestion, stomach bug  2 wk h/o sinus congestion, headache and blowing nose.  3d h/o diarrhea, nausea/vomiting.  + sick contacts at work with stomach bug - works at [employer].  Low grade fever with chills.  Nausea/vomiting progressed to diarrhea.  Diarrhea about 3-5 times daily.  Emesis NBNB.  No blood in stool.  No abd cramping.  So far has tried phenergan and ginger ale and bland diet.  Keeping ginger ale down.  No ear or tooth pain, coughing.    Seen here 05/05/2012 with concern for pyelo, treated with CTX IM and cipro 500mg  bid x 10 days, sxs resolved.  Husband smokes inside.  Pt smokes at home. + h/o asthma and COPD.   Past Medical History  Diagnosis Date  . Chronic back pain     multilevel spondylitic changes, scoliosis, and anterolisthesis L4 not thought to currently be surg candidate (Dr. Phoebe Perch, [facility]) (Dr. Ollen Bowl, [place])  . Bronchitis, chronic obstructive 2008 & 2009    FeV1 64% TLC 105% DLCO 55% 2008  -  FeV1 81% FeF 25-75 43% 2009  . Nonspecific abnormal electrocardiogram (ECG) (EKG)   . Leukocytosis, unspecified   . Essential hypertension, benign   . Hx of migraines   . Bipolar I disorder, most recent episode (or current) manic, unspecified   . Pure hyperglyceridemia   . Acute pancreatitis   . Emphysema of lung   . Nicotine addiction   . Asthma   . Obesity   . Swelling of limb   . Knee pain, right   . Suicide attempt 06/2001  . COPD (chronic obstructive pulmonary disease)   . History of pyelonephritis 05/2012     Review of Systems Per HPI    Objective:   Physical Exam  Nursing note and vitals reviewed. Constitutional: She appears well-developed and well-nourished. No distress.  HENT:  Head: Normocephalic and atraumatic.  Right Ear: Hearing, tympanic membrane, external ear and ear canal normal.  Left Ear: Hearing, tympanic membrane,  external ear and ear canal normal.  Nose: No mucosal edema or rhinorrhea. Right sinus exhibits maxillary sinus tenderness. Right sinus exhibits no frontal sinus tenderness. Left sinus exhibits maxillary sinus tenderness. Left sinus exhibits no frontal sinus tenderness.  Mouth/Throat: Uvula is midline and mucous membranes are normal. Posterior oropharyngeal erythema present. No oropharyngeal exudate, posterior oropharyngeal edema or tonsillar abscesses.       Fluid behind TMs bilaterally  Eyes: Conjunctivae normal and EOM are normal. Pupils are equal, round, and reactive to light. No scleral icterus.  Neck: Normal range of motion. Neck supple.  Cardiovascular: Normal rate, regular rhythm, normal heart sounds and intact distal pulses.   No murmur heard. Pulmonary/Chest: Effort normal and breath sounds normal. No respiratory distress. She has no wheezes. She has no rales.  Abdominal: Soft. Bowel sounds are normal. She exhibits no distension and no mass. There is no tenderness. There is no rebound and no guarding.  Musculoskeletal: She exhibits no edema.  Lymphadenopathy:    She has no cervical adenopathy.  Skin: Skin is warm and dry. No rash noted.  Psychiatric: She has a normal mood and affect.       Assessment & Plan:

## 2012-06-20 NOTE — Patient Instructions (Signed)
You have a sinus infection as well as likely viral gastroenteritis. Take medicine as prescribed once stomach issues are better: augmentin Yogurt once you start antibiotic. Continue phenergan, bland diet and lots of fluids.  Nasal saline irrigation or neti pot to help drain sinuses. May use simple mucinex with plenty of fluid to help mobilize mucous. Let us know if fever >101.5, trouble opening/closing mouth, difficulty swallowing, or worsening - you may need to be seen again.

## 2012-07-17 ENCOUNTER — Other Ambulatory Visit: Payer: Self-pay

## 2012-07-27 ENCOUNTER — Encounter: Payer: Self-pay | Admitting: Family Medicine

## 2012-07-30 DIAGNOSIS — N3946 Mixed incontinence: Secondary | ICD-10-CM

## 2012-07-30 HISTORY — DX: Mixed incontinence: N39.46

## 2012-08-17 ENCOUNTER — Encounter: Payer: Self-pay | Admitting: Family Medicine

## 2012-08-19 ENCOUNTER — Other Ambulatory Visit: Payer: Self-pay | Admitting: Family Medicine

## 2012-11-06 ENCOUNTER — Other Ambulatory Visit: Payer: Self-pay | Admitting: Family Medicine

## 2012-11-09 ENCOUNTER — Telehealth: Payer: Self-pay

## 2012-11-09 NOTE — Telephone Encounter (Signed)
signed and placed in my out box 

## 2012-11-09 NOTE — Telephone Encounter (Signed)
Prior auth for Ventolin  in Dr Sharen Hones in box for completion.

## 2012-11-28 ENCOUNTER — Telehealth: Payer: Self-pay | Admitting: *Deleted

## 2012-11-28 NOTE — Telephone Encounter (Signed)
PA form for Ventolin in your IN box for completion.

## 2012-11-29 NOTE — Telephone Encounter (Signed)
Filled and placed in my out box. 

## 2013-02-24 ENCOUNTER — Telehealth: Payer: Self-pay | Admitting: Family Medicine

## 2013-02-24 ENCOUNTER — Encounter (HOSPITAL_COMMUNITY): Payer: Self-pay | Admitting: Emergency Medicine

## 2013-02-24 ENCOUNTER — Emergency Department (HOSPITAL_COMMUNITY): Payer: BC Managed Care – PPO

## 2013-02-24 ENCOUNTER — Emergency Department (HOSPITAL_COMMUNITY)
Admission: EM | Admit: 2013-02-24 | Discharge: 2013-02-24 | Disposition: A | Discharge status: Home / Self Care | Payer: BC Managed Care – PPO | Attending: Emergency Medicine | Admitting: Emergency Medicine

## 2013-02-24 DIAGNOSIS — Z87891 Personal history of nicotine dependence: Secondary | ICD-10-CM | POA: Insufficient documentation

## 2013-02-24 DIAGNOSIS — R0789 Other chest pain: Secondary | ICD-10-CM | POA: Insufficient documentation

## 2013-02-24 DIAGNOSIS — Z8719 Personal history of other diseases of the digestive system: Secondary | ICD-10-CM | POA: Insufficient documentation

## 2013-02-24 DIAGNOSIS — J441 Chronic obstructive pulmonary disease with (acute) exacerbation: Secondary | ICD-10-CM | POA: Insufficient documentation

## 2013-02-24 DIAGNOSIS — Z8669 Personal history of other diseases of the nervous system and sense organs: Secondary | ICD-10-CM | POA: Insufficient documentation

## 2013-02-24 DIAGNOSIS — F411 Generalized anxiety disorder: Secondary | ICD-10-CM | POA: Insufficient documentation

## 2013-02-24 DIAGNOSIS — Z87448 Personal history of other diseases of urinary system: Secondary | ICD-10-CM | POA: Insufficient documentation

## 2013-02-24 DIAGNOSIS — F419 Anxiety disorder, unspecified: Secondary | ICD-10-CM

## 2013-02-24 DIAGNOSIS — M7989 Other specified soft tissue disorders: Secondary | ICD-10-CM | POA: Insufficient documentation

## 2013-02-24 DIAGNOSIS — Z8639 Personal history of other endocrine, nutritional and metabolic disease: Secondary | ICD-10-CM | POA: Insufficient documentation

## 2013-02-24 DIAGNOSIS — E669 Obesity, unspecified: Secondary | ICD-10-CM | POA: Insufficient documentation

## 2013-02-24 DIAGNOSIS — I10 Essential (primary) hypertension: Secondary | ICD-10-CM | POA: Insufficient documentation

## 2013-02-24 DIAGNOSIS — Z791 Long term (current) use of non-steroidal anti-inflammatories (NSAID): Secondary | ICD-10-CM | POA: Insufficient documentation

## 2013-02-24 DIAGNOSIS — Z79899 Other long term (current) drug therapy: Secondary | ICD-10-CM | POA: Insufficient documentation

## 2013-02-24 DIAGNOSIS — Z8739 Personal history of other diseases of the musculoskeletal system and connective tissue: Secondary | ICD-10-CM | POA: Insufficient documentation

## 2013-02-24 DIAGNOSIS — Z862 Personal history of diseases of the blood and blood-forming organs and certain disorders involving the immune mechanism: Secondary | ICD-10-CM | POA: Insufficient documentation

## 2013-02-24 DIAGNOSIS — G43909 Migraine, unspecified, not intractable, without status migrainosus: Secondary | ICD-10-CM | POA: Insufficient documentation

## 2013-02-24 DIAGNOSIS — F3110 Bipolar disorder, current episode manic without psychotic features, unspecified: Secondary | ICD-10-CM | POA: Insufficient documentation

## 2013-02-24 LAB — CBC WITH DIFFERENTIAL/PLATELET
Basophils Absolute: 0 10*3/uL (ref 0.0–0.1)
Basophils Relative: 0 % (ref 0–1)
Eosinophils Absolute: 0.4 10*3/uL (ref 0.0–0.7)
Eosinophils Relative: 4 % (ref 0–5)
HCT: 38.9 % (ref 36.0–46.0)
Hemoglobin: 13.4 g/dL (ref 12.0–15.0)
Lymphocytes Relative: 24 % (ref 12–46)
Lymphs Abs: 2.8 10*3/uL (ref 0.7–4.0)
MCH: 31.3 pg (ref 26.0–34.0)
MCHC: 34.4 g/dL (ref 30.0–36.0)
MCV: 90.9 fL (ref 78.0–100.0)
Monocytes Absolute: 0.8 10*3/uL (ref 0.1–1.0)
Monocytes Relative: 7 % (ref 3–12)
Neutro Abs: 7.5 10*3/uL (ref 1.7–7.7)
Neutrophils Relative %: 65 % (ref 43–77)
Platelets: 213 10*3/uL (ref 150–400)
RBC: 4.28 MIL/uL (ref 3.87–5.11)
RDW: 13.8 % (ref 11.5–15.5)
WBC: 11.5 10*3/uL — ABNORMAL HIGH (ref 4.0–10.5)

## 2013-02-24 LAB — COMPREHENSIVE METABOLIC PANEL
ALT: 16 U/L (ref 0–35)
AST: 14 U/L (ref 0–37)
Albumin: 3.2 g/dL — ABNORMAL LOW (ref 3.5–5.2)
Alkaline Phosphatase: 139 U/L — ABNORMAL HIGH (ref 39–117)
BUN: 10 mg/dL (ref 6–23)
CO2: 26 mEq/L (ref 19–32)
Calcium: 8.6 mg/dL (ref 8.4–10.5)
Chloride: 104 mEq/L (ref 96–112)
Creatinine, Ser: 0.73 mg/dL (ref 0.50–1.10)
GFR calc Af Amer: >90 mL/min (ref >90)
GFR calc non Af Amer: >90 mL/min (ref >90)
Glucose, Bld: 107 mg/dL — ABNORMAL HIGH (ref 70–99)
Potassium: 4.3 mEq/L (ref 3.5–5.1)
Sodium: 139 mEq/L (ref 135–145)
Total Bilirubin: 0.1 mg/dL — ABNORMAL LOW (ref 0.3–1.2)
Total Protein: 6.9 g/dL (ref 6.0–8.3)

## 2013-02-24 LAB — PRO B NATRIURETIC PEPTIDE: Pro B Natriuretic peptide (BNP): 104.2 pg/mL (ref 0–125)

## 2013-02-24 LAB — D-DIMER, QUANTITATIVE (NOT AT ARMC): D-Dimer, Quant: 0.27 ug/mL-FEU (ref 0.00–0.48)

## 2013-02-24 LAB — TROPONIN I
Troponin I: <0.3 ng/mL (ref <0.30)
Troponin I: <0.3 ng/mL (ref <0.30)

## 2013-02-24 MED ORDER — ALBUTEROL SULFATE (5 MG/ML) 0.5% IN NEBU
5.0000 mg | INHALATION_SOLUTION | Freq: Once | RESPIRATORY_TRACT | Status: AC
  Administered 2013-02-24: 5 mg via RESPIRATORY_TRACT
  Filled 2013-02-24: qty 1

## 2013-02-24 MED ORDER — LORAZEPAM 1 MG PO TABS
1.0000 mg | ORAL_TABLET | Freq: Once | ORAL | Status: AC
  Administered 2013-02-24: 1 mg via ORAL
  Filled 2013-02-24: qty 1

## 2013-02-24 NOTE — ED Notes (Signed)
Pt comfortable with d/c and f/u instructions. No prescriptions 

## 2013-02-24 NOTE — Telephone Encounter (Signed)
Noted  

## 2013-02-24 NOTE — ED Notes (Signed)
Pt arrived from work by [employer] with c/o chest pressure that starts in the center of chest and radiates down left arm started at 0830. Pt c/o numbness and tingling to left arm as well. Pt self administered ASA 81mg  x3 and when called 911 they told her to administer 4 baby ASA and pt now has a total of ASA 81MG  x7. 12 lead unremarkable. Denies pain just stated that its chest pressure. BP-141/86 HR-88 Resp-18 [id number]-97%ra CBG-139

## 2013-02-24 NOTE — ED Provider Notes (Signed)
CSN: [id number]     Arrival date & time 02/24/13  1005 History   First MD Initiated Contact with Patient 02/24/13 1010     Chief Complaint  Patient presents with  . Chest Pain   (Consider location/radiation/quality/duration/timing/severity/associated sxs/prior Treatment) HPI Patient has a history of COPD/asthma. She states she went to work today around 8. She became short of breath which she thought was due to 2 cleaning chemicals. She used her inhaler with relief of her shortness of breath. She then began to have waves of chest pressure radiating from her epigastrium up to her left shoulder. She became anxious and called EMS. She is given 4 baby aspirin by EMS. Her vital signs were stable en route. Patient states the pain is mostly resolved at present. She describes it as pressure that comes in waves. She denies ever having similar pain before. She denies current shortness of breath. She denies cough, fevers or chills. She does state that she's had several weeks of lower extremity swelling with pitting edema. She is a long-term smoker and recently stopped beginning of this month. She does have a history of hypertension and family history of coronary artery disease. Past Medical History  Diagnosis Date  . Bronchitis, chronic obstructive 2008 & 2009    FeV1 64% TLC 105% DLCO 55% 2008  -  FeV1 81% FeF 25-75 43% 2009  . Nonspecific abnormal electrocardiogram (ECG) (EKG)   . Leukocytosis, unspecified   . HTN (hypertension)   . Hx of migraines   . Bipolar I disorder, most recent episode (or current) manic, unspecified   . Pure hyperglyceridemia   . Acute pancreatitis   . COPD (chronic obstructive pulmonary disease)     emphysema  . Nicotine addiction   . Asthma   . Obesity   . Swelling of limb   . Knee pain, right   . Suicide attempt 06/2001  . History of pyelonephritis 05/2012  . Lumbar disc disease with radiculopathy     multilevel spondylitic changes, scoliosis, and anterolisthesis L4 not  thought to currently be surg candidate (Dr. Phoebe Perch, [facility]) (Dr. Ollen Bowl, [place])  . Urge and stress incontinence 07/2012    (MacDiarmid)   Past Surgical History  Procedure Laterality Date  . Lumbar mri  11/2010    multilevel spondylitic changes with upper lumbar scoliosis and anterolisthesis of L4 on 5 with some L foraminal narrowing esp at L/3 with concavity of scoliosis, some central stenosis and biforaminal narrowing at L4/5 with spondylolisthesis  . Cholecystectomy  07/1982  . Tubal ligation    . Retained stone      ERCP secondary to retained stone  . Right knee surgery  1984,1989,1989,1991,1994    Reconstructions for ACL insuff  . Ercp / sphincterotomy - stenosis  01/15/06  . Ct maxillofacial wo/w cm  06/14/06    Left max mucocele  . Ct of chest  06/14/2006    Normal except c/w active inflammation / infection.  Left renal atrophy  .  - pneumonia, acute asthma, left maxillary sinusitis  01/11 - 06/18/2006   Family History  Problem Relation Age of Onset  . Hypertension Mother   . Hyperlipidemia Mother   . Hypertension Father   . Heart disease Brother     MI in early 77's   History  Substance Use Topics  . Smoking status: Former Smoker -- 0.50 packs/day for 30 years    Types: Cigarettes    Quit date: 01/30/2013  . Smokeless tobacco: Never Used  .  Alcohol Use: Yes     Comment: occasional    OB History   Grav Para Term Preterm Abortions TAB SAB Ect Mult Living                 Review of Systems  Constitutional: Negative for fever and chills.  HENT: Negative for neck pain and neck stiffness.   Respiratory: Positive for chest tightness and shortness of breath. Negative for cough and wheezing.   Cardiovascular: Positive for chest pain and leg swelling. Negative for palpitations.  Gastrointestinal: Negative for nausea, vomiting, abdominal pain and diarrhea.  Musculoskeletal: Negative for myalgias and back pain.  Skin: Negative for rash and wound.  Neurological:  Negative for dizziness, weakness, light-headedness, numbness and headaches.  All other systems reviewed and are negative.    Allergies  Hydrocodone-acetaminophen and Metolazone  Home Medications   Current Outpatient Rx  Name  Route  Sig  Dispense  Refill  . EXPIRED: albuterol (PROVENTIL) (2.5 MG/3ML) 0.083% nebulizer solution   Nebulization   Take 3 mLs (2.5 mg total) by nebulization once.   150 mL   1   . EXPIRED: albuterol-ipratropium (COMBIVENT) 18-103 MCG/ACT inhaler   Inhalation   Inhale 2 puffs into the lungs 4 (four) times daily.   1 Inhaler   4   . budesonide-formoterol (SYMBICORT) 80-4.5 MCG/ACT inhaler   Inhalation   Inhale 2 puffs into the lungs 2 (two) times daily.   1 Inhaler   3   . cyclobenzaprine (FLEXERIL) 10 MG tablet   Oral   Take 10 mg by mouth 3 (three) times daily as needed.           . DULoxetine (CYMBALTA) 60 MG capsule   Oral   Take 60 mg by mouth daily.         Marland Kitchen gabapentin (NEURONTIN) 600 MG tablet   Oral   Take 600 mg by mouth 2 (two) times daily.           Marland Kitchen lisinopril (PRINIVIL,ZESTRIL) 2.5 MG tablet      TAKE ONE TABLET BY MOUTH EVERY DAY IN THE EVENING   30 tablet   11   . naproxen (NAPROSYN) 500 MG tablet      Take one po bid x 1 week then prn pain, take with food   60 tablet   0   . promethazine (PHENERGAN) 25 MG tablet   Oral   Take 1 tablet (25 mg total) by mouth every 8 (eight) hours as needed for nausea.   20 tablet   0   . topiramate (TOPAMAX) 100 MG tablet   Oral   Take 100 mg by mouth 2 (two) times daily.         . VENTOLIN HFA 108 (90 BASE) MCG/ACT inhaler      INHALE TWO PUFFS BY MOUTH EVERY 4 HOURS AS NEEDED   18 each   6    BP 106/83  Temp(Src) 98.7 F (37.1 C) (Oral)  Resp 18  SpO2 97%  LMP 02/10/2013 Physical Exam  Nursing note and vitals reviewed. Constitutional: She is oriented to person, place, and time. She appears well-developed and well-nourished.  Patient is anxious and  tearful.  HENT:  Head: Normocephalic and atraumatic.  Mouth/Throat: Oropharynx is clear and moist.  Eyes: EOM are normal. Pupils are equal, round, and reactive to light.  Neck: Normal range of motion. Neck supple.  Cardiovascular: Normal rate and regular rhythm.   Pulmonary/Chest: Effort normal and breath sounds normal. No  respiratory distress. She has no wheezes. She has no rales. She exhibits no tenderness.  Abdominal: Soft. Bowel sounds are normal. She exhibits no distension and no mass. There is no tenderness. There is no rebound and no guarding.  Musculoskeletal: Normal range of motion. She exhibits no edema and no tenderness.  Mild generalized swelling to bilateral legs without any pitting edema. She has no calf tenderness.  Neurological: She is alert and oriented to person, place, and time.  Patient is alert and oriented x3 with clear, goal oriented speech. Patient has 5/5 motor in all extremities. Sensation is intact to light touch.    Skin: Skin is warm and dry. No rash noted. No erythema.  Psychiatric:  Anxious    ED Course  Procedures (including critical care time) Labs Review Labs Reviewed  CBC WITH DIFFERENTIAL  COMPREHENSIVE METABOLIC PANEL  TROPONIN I   Imaging Review No results found.  Date: 02/24/2013  Rate: 85  Rhythm: normal sinus rhythm  QRS Axis: normal  Intervals: normal  ST/T Wave abnormalities: normal  Conduction Disutrbances:none  Narrative Interpretation:   Old EKG Reviewed: unchanged   MDM   Patient's symptoms are atypical for coronary artery disease. She does however have significant risk factors for CAD. EKG is unremarkable. We'll screen and discuss with cardiology.  Patient's chest pain has resolved. She still is anxious and tearful. I discussed her case with Dr. Elease Hashimoto. Recommends repeat troponin and if negative patient is to followup as an outpatient.  Loren Racer, MD 02/24/13 1536

## 2013-02-24 NOTE — Telephone Encounter (Signed)
Patient Information:  Caller Name: Brieanna  Phone: [phone]  Patient: Brenda [name]  Gender: Female  DOB: 1961-01-23  Age: 52 Years  PCP: [name] [facility])  Pregnant: No  Office Follow Up:  Does the office need to follow up with this patient?: No  Instructions For The Office: N/A  RN Note:  Staff gave her three ASA 81 mg.  Relucant to call 911; educated about need for immediate assessment/treatment, risk of permanent damage or death if CVA. Use 911 to expedite care; instructed not to drive herself to ED.  Symptoms  Reason For Call & Symptoms: Emergent Call: Reports numbness and tingling  left shoulder to hand the arm.  Noted waves of pain in upper arm for past 15 minutes.  Onset 0840 02/24/13. Able to use hand and lift arm but reported weakness in left arm. Denied chest pain or headache.  BP not checked.   Reviewed Health History In EMR: Yes  Reviewed Medications In EMR: Yes  Reviewed Allergies In EMR: Yes  Reviewed Surgeries / Procedures: Yes  Date of Onset of Symptoms: 02/24/2013  Treatments Tried: ASA 81 mg X3  Treatments Tried Worked: No OB / GYN:  LMP: 02/10/2013  Guideline(s) Used:  Neurologic Deficit  Disposition Per Guideline:   Call EMS 911 Now  Reason For Disposition Reached:   New neurologic deficit that is present NOW, sudden onset of ANY of the following:   Weakness of the face, arm, or leg on one side of the body  Numbness of the face, arm, or leg on one side of the body  Loss of speech or garbled speech  Advice Given:  N/A  Patient Refused Recommendation:  Patient Will Go To ED  Redge Gainer

## 2013-04-06 ENCOUNTER — Other Ambulatory Visit: Payer: Self-pay

## 2013-04-13 ENCOUNTER — Other Ambulatory Visit: Payer: Self-pay | Admitting: Neurosurgery

## 2013-04-13 DIAGNOSIS — M4310 Spondylolisthesis, site unspecified: Secondary | ICD-10-CM

## 2013-04-23 ENCOUNTER — Ambulatory Visit
Admission: RE | Admit: 2013-04-23 | Discharge: 2013-04-23 | Disposition: A | Discharge status: Home / Self Care | Payer: BC Managed Care – PPO | Source: Ambulatory Visit | Attending: Neurosurgery | Admitting: Neurosurgery

## 2013-04-23 DIAGNOSIS — M4310 Spondylolisthesis, site unspecified: Secondary | ICD-10-CM

## 2013-05-30 ENCOUNTER — Encounter (HOSPITAL_COMMUNITY): Payer: Self-pay | Admitting: Emergency Medicine

## 2013-05-30 ENCOUNTER — Emergency Department (HOSPITAL_COMMUNITY): Payer: BC Managed Care – PPO

## 2013-05-30 ENCOUNTER — Inpatient Hospital Stay (HOSPITAL_COMMUNITY)
Admission: EM | Admit: 2013-05-30 | Discharge: 2013-06-02 | DRG: 191 | Disposition: A | Discharge status: Home / Self Care | Payer: BC Managed Care – PPO | Attending: Internal Medicine | Admitting: Internal Medicine

## 2013-05-30 DIAGNOSIS — N12 Tubulo-interstitial nephritis, not specified as acute or chronic: Secondary | ICD-10-CM

## 2013-05-30 DIAGNOSIS — Z87898 Personal history of other specified conditions: Secondary | ICD-10-CM

## 2013-05-30 DIAGNOSIS — E781 Pure hyperglyceridemia: Secondary | ICD-10-CM

## 2013-05-30 DIAGNOSIS — A084 Viral intestinal infection, unspecified: Secondary | ICD-10-CM

## 2013-05-30 DIAGNOSIS — F17200 Nicotine dependence, unspecified, uncomplicated: Secondary | ICD-10-CM

## 2013-05-30 DIAGNOSIS — J441 Chronic obstructive pulmonary disease with (acute) exacerbation: Principal | ICD-10-CM

## 2013-05-30 DIAGNOSIS — Z79899 Other long term (current) drug therapy: Secondary | ICD-10-CM

## 2013-05-30 DIAGNOSIS — G43909 Migraine, unspecified, not intractable, without status migrainosus: Secondary | ICD-10-CM

## 2013-05-30 DIAGNOSIS — E669 Obesity, unspecified: Secondary | ICD-10-CM

## 2013-05-30 DIAGNOSIS — J449 Chronic obstructive pulmonary disease, unspecified: Secondary | ICD-10-CM

## 2013-05-30 DIAGNOSIS — R9431 Abnormal electrocardiogram [ECG] [EKG]: Secondary | ICD-10-CM

## 2013-05-30 DIAGNOSIS — J45909 Unspecified asthma, uncomplicated: Secondary | ICD-10-CM

## 2013-05-30 DIAGNOSIS — J09X2 Influenza due to identified novel influenza A virus with other respiratory manifestations: Secondary | ICD-10-CM

## 2013-05-30 DIAGNOSIS — J4489 Other specified chronic obstructive pulmonary disease: Secondary | ICD-10-CM

## 2013-05-30 DIAGNOSIS — K859 Acute pancreatitis without necrosis or infection, unspecified: Secondary | ICD-10-CM

## 2013-05-30 DIAGNOSIS — R0682 Tachypnea, not elsewhere classified: Secondary | ICD-10-CM | POA: Diagnosis present

## 2013-05-30 DIAGNOSIS — M545 Low back pain, unspecified: Secondary | ICD-10-CM | POA: Diagnosis present

## 2013-05-30 DIAGNOSIS — F17210 Nicotine dependence, cigarettes, uncomplicated: Secondary | ICD-10-CM | POA: Diagnosis present

## 2013-05-30 DIAGNOSIS — I10 Essential (primary) hypertension: Secondary | ICD-10-CM

## 2013-05-30 DIAGNOSIS — Z87891 Personal history of nicotine dependence: Secondary | ICD-10-CM | POA: Diagnosis present

## 2013-05-30 DIAGNOSIS — K047 Periapical abscess without sinus: Secondary | ICD-10-CM

## 2013-05-30 DIAGNOSIS — Z7982 Long term (current) use of aspirin: Secondary | ICD-10-CM

## 2013-05-30 DIAGNOSIS — Z6841 Body Mass Index (BMI) 40.0 and over, adult: Secondary | ICD-10-CM

## 2013-05-30 DIAGNOSIS — F319 Bipolar disorder, unspecified: Secondary | ICD-10-CM | POA: Diagnosis present

## 2013-05-30 DIAGNOSIS — G894 Chronic pain syndrome: Secondary | ICD-10-CM

## 2013-05-30 DIAGNOSIS — F4322 Adjustment disorder with anxiety: Secondary | ICD-10-CM | POA: Diagnosis present

## 2013-05-30 DIAGNOSIS — F39 Unspecified mood [affective] disorder: Secondary | ICD-10-CM | POA: Diagnosis present

## 2013-05-30 DIAGNOSIS — M7989 Other specified soft tissue disorders: Secondary | ICD-10-CM

## 2013-05-30 DIAGNOSIS — J019 Acute sinusitis, unspecified: Secondary | ICD-10-CM

## 2013-05-30 DIAGNOSIS — J438 Other emphysema: Secondary | ICD-10-CM

## 2013-05-30 DIAGNOSIS — F3110 Bipolar disorder, current episode manic without psychotic features, unspecified: Secondary | ICD-10-CM

## 2013-05-30 DIAGNOSIS — D72829 Elevated white blood cell count, unspecified: Secondary | ICD-10-CM

## 2013-05-30 LAB — PRO B NATRIURETIC PEPTIDE
Pro B Natriuretic peptide (BNP): 158.5 pg/mL — ABNORMAL HIGH (ref 0–125)
Pro B Natriuretic peptide (BNP): 125.3 pg/mL — ABNORMAL HIGH (ref 0–125)

## 2013-05-30 LAB — CBC
HCT: 41.8 % (ref 36.0–46.0)
Hemoglobin: 13.9 g/dL (ref 12.0–15.0)
MCH: 30.6 pg (ref 26.0–34.0)
MCHC: 33.3 g/dL (ref 30.0–36.0)
MCV: 92.1 fL (ref 78.0–100.0)
Platelets: 276 10*3/uL (ref 150–400)
RBC: 4.54 MIL/uL (ref 3.87–5.11)
RDW: 13.7 % (ref 11.5–15.5)
WBC: 8.1 10*3/uL (ref 4.0–10.5)

## 2013-05-30 LAB — BASIC METABOLIC PANEL
BUN: 11 mg/dL (ref 6–23)
CO2: 25 mEq/L (ref 19–32)
Calcium: 8.7 mg/dL (ref 8.4–10.5)
Chloride: 100 mEq/L (ref 96–112)
Creatinine, Ser: 0.78 mg/dL (ref 0.50–1.10)
GFR calc Af Amer: >90 mL/min (ref >90)
GFR calc non Af Amer: >90 mL/min (ref >90)
Glucose, Bld: 118 mg/dL — ABNORMAL HIGH (ref 70–99)
Potassium: 3.8 mEq/L (ref 3.7–5.3)
Sodium: 138 mEq/L (ref 137–147)

## 2013-05-30 LAB — POCT I-STAT TROPONIN I
Troponin i, poc: 0 ng/mL (ref 0.00–0.08)
Troponin i, poc: 0 ng/mL (ref 0.00–0.08)

## 2013-05-30 LAB — TROPONIN I: Troponin I: <0.3 ng/mL (ref <0.30)

## 2013-05-30 MED ORDER — ALBUTEROL (5 MG/ML) CONTINUOUS INHALATION SOLN
15.0000 mg/h | INHALATION_SOLUTION | Freq: Once | RESPIRATORY_TRACT | Status: AC
  Administered 2013-05-30: 15 mg/h via RESPIRATORY_TRACT
  Filled 2013-05-30: qty 20

## 2013-05-30 MED ORDER — METHYLPREDNISOLONE SODIUM SUCC 125 MG IJ SOLR
125.0000 mg | INTRAMUSCULAR | Status: DC
  Filled 2013-05-30: qty 2

## 2013-05-30 MED ORDER — MAGNESIUM SULFATE 40 MG/ML IJ SOLN
2.0000 g | Freq: Once | INTRAMUSCULAR | Status: AC
  Administered 2013-05-30: 2 g via INTRAVENOUS
  Filled 2013-05-30: qty 50

## 2013-05-30 MED ORDER — IPRATROPIUM BROMIDE 0.02 % IN SOLN
1.5000 mg | Freq: Once | RESPIRATORY_TRACT | Status: AC
  Administered 2013-05-30: 1.5 mg via RESPIRATORY_TRACT
  Filled 2013-05-30: qty 7.5

## 2013-05-30 MED ORDER — ASPIRIN EC 81 MG PO TBEC: 81.0000 mg | DELAYED_RELEASE_TABLET | Freq: Every day | ORAL | Status: DC

## 2013-05-30 MED ORDER — SODIUM CHLORIDE 0.9 % IV SOLN: INTRAVENOUS | Status: DC

## 2013-05-30 MED ORDER — ENOXAPARIN SODIUM 40 MG/0.4ML ~~LOC~~ SOLN
40.0000 mg | SUBCUTANEOUS | Status: DC
  Administered 2013-05-30 – 2013-06-01 (×3): 40 mg via SUBCUTANEOUS
  Filled 2013-05-30 (×5): qty 0.4

## 2013-05-30 MED ORDER — ASPIRIN 325 MG PO TABS: 975.0000 mg | ORAL_TABLET | ORAL | Status: DC | PRN

## 2013-05-30 MED ORDER — ASPIRIN 325 MG PO TABS
325.0000 mg | ORAL_TABLET | ORAL | Status: DC | PRN
  Filled 2013-05-30: qty 1

## 2013-05-30 MED ORDER — LISINOPRIL 2.5 MG PO TABS
2.5000 mg | ORAL_TABLET | Freq: Every day | ORAL | Status: DC
  Administered 2013-05-30 – 2013-06-02 (×4): 2.5 mg via ORAL
  Filled 2013-05-30 (×4): qty 1

## 2013-05-30 MED ORDER — ALBUTEROL (5 MG/ML) CONTINUOUS INHALATION SOLN
15.0000 mg/h | INHALATION_SOLUTION | RESPIRATORY_TRACT | Status: DC
  Administered 2013-05-30: 15 mg/h via RESPIRATORY_TRACT

## 2013-05-30 MED ORDER — TOPIRAMATE 100 MG PO TABS
100.0000 mg | ORAL_TABLET | Freq: Two times a day (BID) | ORAL | Status: DC
  Administered 2013-05-30 – 2013-06-02 (×6): 100 mg via ORAL
  Filled 2013-05-30 (×8): qty 1

## 2013-05-30 MED ORDER — IPRATROPIUM-ALBUTEROL 0.5-2.5 (3) MG/3ML IN SOLN
3.0000 mL | Freq: Four times a day (QID) | RESPIRATORY_TRACT | Status: DC
  Administered 2013-05-30 – 2013-05-31 (×3): 3 mL via RESPIRATORY_TRACT
  Filled 2013-05-30 (×3): qty 3

## 2013-05-30 MED ORDER — LEVOFLOXACIN IN D5W 750 MG/150ML IV SOLN
750.0000 mg | INTRAVENOUS | Status: DC
  Administered 2013-05-31: 750 mg via INTRAVENOUS
  Filled 2013-05-30 (×2): qty 150

## 2013-05-30 MED ORDER — LEVOFLOXACIN IN D5W 750 MG/150ML IV SOLN
750.0000 mg | Freq: Once | INTRAVENOUS | Status: AC
  Administered 2013-05-30: 750 mg via INTRAVENOUS
  Filled 2013-05-30: qty 150

## 2013-05-30 MED ORDER — MORPHINE SULFATE 2 MG/ML IJ SOLN
1.0000 mg | INTRAMUSCULAR | Status: DC | PRN
  Administered 2013-05-30: 1 mg via INTRAVENOUS
  Administered 2013-05-31 – 2013-06-01 (×4): 2 mg via INTRAVENOUS
  Filled 2013-05-30 (×5): qty 1

## 2013-05-30 MED ORDER — GABAPENTIN 600 MG PO TABS
1200.0000 mg | ORAL_TABLET | Freq: Two times a day (BID) | ORAL | Status: DC
  Administered 2013-05-30 – 2013-06-02 (×6): 1200 mg via ORAL
  Filled 2013-05-30 (×10): qty 2

## 2013-05-30 MED ORDER — METHYLPREDNISOLONE SODIUM SUCC 125 MG IJ SOLR
125.0000 mg | Freq: Once | INTRAMUSCULAR | Status: AC
  Administered 2013-05-30: 125 mg via INTRAVENOUS
  Filled 2013-05-30: qty 2

## 2013-05-30 MED ORDER — DULOXETINE HCL 60 MG PO CPEP
60.0000 mg | ORAL_CAPSULE | Freq: Every day | ORAL | Status: DC
  Administered 2013-05-31 – 2013-06-02 (×3): 60 mg via ORAL
  Filled 2013-05-30 (×3): qty 1

## 2013-05-30 NOTE — Progress Notes (Signed)
Report called and received from [facility]

## 2013-05-30 NOTE — ED Provider Notes (Signed)
Medical screening examination/treatment/procedure(s) were conducted as a shared visit with non-physician practitioner(s) and myself.  I personally evaluated the patient during the encounter.  EKG Interpretation   None        CRITICAL CARE Performed by: Dagmar Hait   Total critical care time: 30 minutes  Critical care time was exclusive of separately billable procedures and treating other patients.  Critical care was necessary to treat or prevent imminent or life-threatening deterioration.  Critical care was time spent personally by me on the following activities: development of treatment plan with patient and/or surrogate as well as nursing, discussions with consultants, evaluation of patient's response to treatment, examination of patient, obtaining history from patient or surrogate, ordering and performing treatments and interventions, ordering and review of laboratory studies, ordering and review of radiographic studies, pulse oximetry and re-evaluation of patient's condition.   Patient with history of COPD. She's had shortness of breath for the past few days and is worsening. She has productive cough. She denies any fever. On exam she has an extremely decreased air movement in all lung fields. She is coughing. She is initially talking in short choppy sentences. Continuous albuterol initiated. Solu-Medrol given. After an hour of continuous albuterol, patient is talking in full sentences, however she is still mildly tachypneic and still has decreased air movement, however though it is mildly improved. Patient given another hour of continuous albuterol treatment. Patient admitted to hospitalist. Patient given antibiotics for her COPD exacerbation.  Dagmar Hait, MD 05/30/13 [phone]

## 2013-05-30 NOTE — ED Provider Notes (Signed)
CSN: [id number]     Arrival date & time 05/30/13  1154 History   First MD Initiated Contact with Patient 05/30/13 1156     Chief Complaint  Patient presents with  . Shortness of Breath   (Consider location/radiation/quality/duration/timing/severity/associated sxs/prior Treatment) HPI Comments: Patient with a history of COPD presents today with a chief complaint of SOB, chest tightness, and wheezing.  She reports that her symptoms began two days ago and have been gradually worsening since that time.  She has been using her Albuterol inhaler at home without relief.  She states that she has been using 2 puffs every six hours.  She reports that she has also had a productive cough for the past 2 days.  She states that yesterday she had a fever of 101 F orally, but no fever since that time.  She is also complaining of body aches.  She denies chest pain, but does have some tightness in her chest.  She reports that her symptoms feel similar to when she has had a COPD exacerbation in the past.  She reports that she has been hospitalized in the past for COPD exacerbations, but has never been intubated.  She is a previous smoker.  Quit smoking three months ago.   No known sick contacts.    Patient is a 52 y.o. female presenting with shortness of breath. The history is provided by the patient.  Shortness of Breath Associated symptoms: wheezing     Past Medical History  Diagnosis Date  . Bronchitis, chronic obstructive 2008 & 2009    FeV1 64% TLC 105% DLCO 55% 2008  -  FeV1 81% FeF 25-75 43% 2009  . Nonspecific abnormal electrocardiogram (ECG) (EKG)   . Leukocytosis, unspecified   . HTN (hypertension)   . Hx of migraines   . Bipolar I disorder, most recent episode (or current) manic, unspecified   . Pure hyperglyceridemia   . Acute pancreatitis   . COPD (chronic obstructive pulmonary disease)     emphysema  . Nicotine addiction   . Asthma   . Obesity   . Swelling of limb   . Knee pain, right   .  Suicide attempt 06/2001  . History of pyelonephritis 05/2012  . Lumbar disc disease with radiculopathy     multilevel spondylitic changes, scoliosis, and anterolisthesis L4 not thought to currently be surg candidate (Dr. Phoebe Perch, [facility]) (Dr. Ollen Bowl, [place])  . Urge and stress incontinence 07/2012    (MacDiarmid)   Past Surgical History  Procedure Laterality Date  . Lumbar mri  11/2010    multilevel spondylitic changes with upper lumbar scoliosis and anterolisthesis of L4 on 5 with some L foraminal narrowing esp at L/3 with concavity of scoliosis, some central stenosis and biforaminal narrowing at L4/5 with spondylolisthesis  . Cholecystectomy  07/1982  . Tubal ligation    . Retained stone      ERCP secondary to retained stone  . Right knee surgery  1984,1989,1989,1991,1994    Reconstructions for ACL insuff  . Ercp / sphincterotomy - stenosis  01/15/06  . Ct maxillofacial wo/w cm  06/14/06    Left max mucocele  . Ct of chest  06/14/2006    Normal except c/w active inflammation / infection.  Left renal atrophy  . [place] - pneumonia, acute asthma, left maxillary sinusitis  01/11 - 06/18/2006   Family History  Problem Relation Age of Onset  . Hypertension Mother   . Hyperlipidemia Mother   . Hypertension Father   .  Heart disease Brother     MI in early 47's   History  Substance Use Topics  . Smoking status: Former Smoker -- 0.50 packs/day for 30 years    Types: Cigarettes    Quit date: 01/30/2013  . Smokeless tobacco: Never Used  . Alcohol Use: Yes     Comment: occasional    OB History   Grav Para Term Preterm Abortions TAB SAB Ect Mult Living                 Review of Systems  Respiratory: Positive for chest tightness, shortness of breath and wheezing.   All other systems reviewed and are negative.    Allergies  Hydrocodone-acetaminophen and Metolazone  Home Medications   Current Outpatient Rx  Name  Route  Sig  Dispense  Refill  . albuterol (PROVENTIL  HFA;VENTOLIN HFA) 108 (90 BASE) MCG/ACT inhaler   Inhalation   Inhale 2 puffs into the lungs every 6 (six) hours as needed for wheezing.         Marland Kitchen aspirin 325 MG tablet   Oral   Take 975 mg by mouth every 4 (four) hours as needed for mild pain.         . DULoxetine (CYMBALTA) 60 MG capsule   Oral   Take 60 mg by mouth daily.         Marland Kitchen gabapentin (NEURONTIN) 600 MG tablet   Oral   Take 1,200 mg by mouth 2 (two) times daily.          Marland Kitchen lisinopril (PRINIVIL,ZESTRIL) 2.5 MG tablet   Oral   Take 2.5 mg by mouth daily.         Marland Kitchen topiramate (TOPAMAX) 100 MG tablet   Oral   Take 100 mg by mouth 2 (two) times daily.          BP 125/104  Pulse 85  Temp(Src) 98.7 F (37.1 C) (Oral)  Resp 25  SpO2 96% Physical Exam  Nursing note and vitals reviewed. Constitutional: She appears well-developed and well-nourished.  HENT:  Head: Normocephalic and atraumatic.  Mouth/Throat: Oropharynx is clear and moist.  Neck: Normal range of motion. Neck supple.  Cardiovascular: Normal rate, regular rhythm and normal heart sounds.   Pulmonary/Chest: Accessory muscle usage present. Tachypnea noted. She has decreased breath sounds. She has wheezes.  Patient unable to speak in complete sentences  Musculoskeletal: Normal range of motion.  No LE edema bilaterally  Neurological: She is alert.  Skin: Skin is warm and dry.  Psychiatric: She has a normal mood and affect.    ED Course  Procedures (including critical care time) Labs Review Labs Reviewed  CULTURE, BLOOD (ROUTINE X 2)  CULTURE, BLOOD (ROUTINE X 2)  CBC  BASIC METABOLIC PANEL  PRO B NATRIURETIC PEPTIDE   Imaging Review No results found.  EKG Interpretation   None      1:18 PM Reassessed patient.  She reports that her symptoms have mildly improved.  Patient currently getting the hour long continuous neb treatment.  1:57 PM Reassessed patient.  Symptoms mildly improved.  However, patient continues to feel very short  of breath.  Breath sounds significantly decreased diffusely.  Diffuse wheezing.  Will order another   2:15 PM Discussed with Dr. Joseph Art with [facility].  He has agreed to admit the patient.   MDM  No diagnosis found. Patient with a history of COPD presents today with chest tightness, wheezing, and SOB.  Patient initially having very increased work of  breathing with significant wheezing.  Patient given hour long continuous neb and 125 mg Solumedrol with very little improvement.  Patient then given additional continuous neb and IV Magnesium.   Patient still with significant wheezing.  Patient admitted to [facility] for additional management of COPD exacerbation.    Santiago Glad, PA-C 05/30/13 [phone]

## 2013-05-30 NOTE — Progress Notes (Signed)
Pt arrived to unit, VSS, call bell within reach, oriented to unit and routine, elink and CMT notified of arrival. Paged attending as ordered.

## 2013-05-30 NOTE — ED Notes (Signed)
Pt given a [place] sandwich and soda per provider order

## 2013-05-30 NOTE — ED Notes (Signed)
Pt reports increased SOB over the past couple of days. States that she has a hx of COPD and her inhalers at home are not working. Reports a tightness in her chest when she breaths. Pt with audible wheezing.

## 2013-05-30 NOTE — ED Notes (Signed)
Talked with Provider. Pt still having audible expiratory wheezing at this time. Pt unable to speak in complete sentences without labored breathing. Orders to be changed to step-down bed.

## 2013-05-30 NOTE — H&P (Signed)
[facility] History and Physical  Brenda [name] FAO:[id number] DOB: 24-Jul-1960 DOA: 05/30/2013  Referring physician:  PCP: [name], MD  Specialists:   Chief Complaint: SOB, wheezing  HPI: Brenda [name] is a 51 y.o. WF  PMHx Bipolar disorder, chronic pain syndrome, HTN, hypertriglyceridemia, nicotine addiction, chronic obstructive bronchitis, reactive airway disease, emphysema. Presented to [facility] complaint of SOB started on Sunday, chest tightness, and wheezing. She reports that her symptoms began two days ago and have been gradually worsening since that time. States has been unable to sleep flat since her symptoms started, has been sleeping in the recliner. She has been using her Albuterol inhaler at home without relief. She states that she has been using 2 puffs every six hours. She reports that she has also had a productive cough for the past 2 days. She states that yesterday she had a fever of 101 F orally, but no fever since that time. She is also complaining of body aches. She denies chest pain, but does have some tightness in her chest. She reports that her symptoms feel similar to when she has had a COPD exacerbation in the past. She reports that she has been hospitalized in the past for COPD exacerbations, but has never been intubated. She is a previous smoker. Quit smoking three months ago. No known sick contacts.     Review of Systems: The patient denies anorexia, weight loss,, vision loss, decreased hearing, hoarseness, chest pain, syncope,  peripheral edema, balance deficits, hemoptysis, abdominal pain, melena, hematochezia, severe indigestion/heartburn, hematuria, incontinence, genital sores, muscle weakness, suspicious skin lesions, transient blindness, difficulty walking, depression, unusual weight change, abnormal bleeding, enlarged lymph nodes, angioedema, and breast masses.    TRAVEL HISTORY: None   Procedure PCXR 05/31/2003  No active  disease   Antibiotics Levofloxacin 12/30>>    Past Medical History  Diagnosis Date  . Bronchitis, chronic obstructive 2008 & 2009    FeV1 64% TLC 105% DLCO 55% 2008  -  FeV1 81% FeF 25-75 43% 2009  . Nonspecific abnormal electrocardiogram (ECG) (EKG)   . Leukocytosis, unspecified   . HTN (hypertension)   . Hx of migraines   . Bipolar I disorder, most recent episode (or current) manic, unspecified   . Pure hyperglyceridemia   . Acute pancreatitis   . COPD (chronic obstructive pulmonary disease)     emphysema  . Nicotine addiction   . Asthma   . Obesity   . Swelling of limb   . Knee pain, right   . Suicide attempt 06/2001  . History of pyelonephritis 05/2012  . Lumbar disc disease with radiculopathy     multilevel spondylitic changes, scoliosis, and anterolisthesis L4 not thought to currently be surg candidate (Dr. Phoebe Perch, [facility]) (Dr. Ollen Bowl, [place])  . Urge and stress incontinence 07/2012    ([facility])   Past Surgical History  Procedure Laterality Date  . Lumbar mri  11/2010    multilevel spondylitic changes with upper lumbar scoliosis and anterolisthesis of L4 on 5 with some L foraminal narrowing esp at L/3 with concavity of scoliosis, some central stenosis and biforaminal narrowing at L4/5 with spondylolisthesis  . Cholecystectomy  07/1982  . Tubal ligation    . Retained stone      ERCP secondary to retained stone  . Right knee surgery  1984,1989,1989,1991,1994    Reconstructions for ACL insuff  . Ercp / sphincterotomy - stenosis  01/15/06  . Ct maxillofacial wo/w cm  06/14/06    Left max mucocele  .  Ct of chest  06/14/2006    Normal except c/w active inflammation / infection.  Left renal atrophy  . [place] - pneumonia, acute asthma, left maxillary sinusitis  01/11 - 06/18/2006   Social History:  reports that she quit smoking about 3 months ago. Her smoking use included Cigarettes. She has a 15 pack-year smoking history. She has never used smokeless tobacco. She  reports that she drinks alcohol. She reports that she does not use illicit drugs. where does patient live--home, ALF, SNF? Home no sick contacts  Can patient participate in ADLs? Yes  Allergies  Allergen Reactions  . Metolazone     REACTION: metabolic mood swings    Family History  Problem Relation Age of Onset  . Hypertension Mother   . Hyperlipidemia Mother   . Hypertension Father   . Heart disease Brother     MI in early 63's    Prior to Admission medications   Medication Sig Start Date End Date Taking? Authorizing Provider  albuterol (PROVENTIL HFA;VENTOLIN HFA) 108 (90 BASE) MCG/ACT inhaler Inhale 2 puffs into the lungs every 6 (six) hours as needed for wheezing.   Yes Historical Provider, MD  aspirin 325 MG tablet Take 975 mg by mouth every 4 (four) hours as needed for mild pain.   Yes Historical Provider, MD  DULoxetine (CYMBALTA) 60 MG capsule Take 60 mg by mouth daily.   Yes Historical Provider, MD  gabapentin (NEURONTIN) 600 MG tablet Take 1,200 mg by mouth 2 (two) times daily.    Yes Historical Provider, MD  lisinopril (PRINIVIL,ZESTRIL) 2.5 MG tablet Take 2.5 mg by mouth daily.   Yes Historical Provider, MD  topiramate (TOPAMAX) 100 MG tablet Take 100 mg by mouth 2 (two) times daily.   Yes Historical Provider, MD   Physical Exam: Filed Vitals:   05/30/13 1357 05/30/13 1415 05/30/13 1549 05/30/13 1702  BP:  126/51  115/74  Pulse:  97 108   Temp:    98.3 F (36.8 C)  TempSrc:    Oral  Resp:  20 24   Height:    5\' 3"  (1.6 m)  Weight:    117.7 kg (259 lb 7.7 oz)  SpO2: 94% 99% 97%      General:  A./O. x4, moderately uncomfortable secondary to increased work of breathing, feeling of shortness of breath  Eyes: Pupils equal round reactive to light and accommodation  Neck: Negative JVD, negative lymphadenopathy  Cardiovascular: Tachycardic, regular rhythm, negative murmurs rubs gallops, DP/PT pulse 2+ bilateral  Respiratory: Diffuse poor air movement with  diffuse beer expiratory wheezing  Abdomen: Obese, nontender, nondistended, plus bowel sounds  Skin: Negative rash, negative lesions  Musculoskeletal: Negative pedal edema negative cyanosis  Neurologic: Cranial nerves II through XII intact, extremity strength 5/5, sensation intact throughout, did not ambulate patient secondary to patient's baseline tachycardia/tachypnea  Labs on Admission:  Basic Metabolic Panel:  Recent Labs Lab 05/30/13 1200  NA 138  K 3.8  CL 100  CO2 25  GLUCOSE 118*  BUN 11  CREATININE 0.78  CALCIUM 8.7   Liver Function Tests: No results found for this basename: AST, ALT, ALKPHOS, BILITOT, PROT, ALBUMIN,  in the last 168 hours No results found for this basename: LIPASE, AMYLASE,  in the last 168 hours No results found for this basename: AMMONIA,  in the last 168 hours CBC:  Recent Labs Lab 05/30/13 1200  WBC 8.1  HGB 13.9  HCT 41.8  MCV 92.1  PLT 276   Cardiac  Enzymes: No results found for this basename: CKTOTAL, CKMB, CKMBINDEX, TROPONINI,  in the last 168 hours  BNP (last 3 results)  Recent Labs  02/24/13 1140 05/30/13 1200  PROBNP 104.2 158.5*   CBG: No results found for this basename: GLUCAP,  in the last 168 hours  Radiological Exams on Admission: Dg Chest Portable 1 View  05/30/2013   CLINICAL DATA:  Asthma.  Short of breath.  EXAM: PORTABLE CHEST - 1 VIEW  COMPARISON:  02/24/2013  FINDINGS: Artifact overlies the chest. Heart size is normal. Mediastinal shadows are normal. The lungs are clear. The vascularity is normal. No bony abnormality seen.  IMPRESSION: No active disease   Electronically Signed   By: Paulina Fusi M.D.   On: 05/30/2013 12:41    EKG:   NSR, with baseline wander; poor EKG; repeat EKG pending  Assessment/Plan Active Problems:   HYPERTRIGLYCERIDEMIA   LEUKOCYTOSIS UNSPECIFIED   BIPOLAR AFFECTIVE DISORDER, MANIC   NICOTINE ADDICTION   BRONCHITIS, OBSTRUCTIVE CHRONIC   EMPHYSEMA   REACTIVE AIRWAY DISEASE    COPD exacerbation   Chronic pain syndrome   COPD exacerbation -Start patient on high-dose steroids -Start patient on DuoNeb scheduled -Start patient on Levaquin -Start patient on O2 to maintain SpO2> 93% -Swab patient for flu  Emphysema/nicotine addiction -Patient stopped smoking approximately 3 months ago no need for nicotine supplements at this time  Chronic pain syndrome -Continue Neurontin, Cymbalta -Will add when necessary morphine which will have a dual effect of decreasing patient's feeling of shortness of breath  Bipolar affective disorder -Currently on no medication  Migraine -Continue home dose Topamax -Patient also has when necessary morphine,      Code Status: Full Family Communication: No family available Disposition Plan: Resolution of COPD exacerbation  Time spent: 90 minutes  Victoriana Aziz, Roselind Messier [facility] Pager [phone]  If 7PM-7AM, please contact night-coverage [email or web address] Password [facility] 05/30/2013, 7:32 PM

## 2013-05-31 LAB — MAGNESIUM: Magnesium: 2.5 mg/dL (ref 1.5–2.5)

## 2013-05-31 LAB — RESPIRATORY VIRUS PANEL
Adenovirus: NOT DETECTED
Influenza A H1: NOT DETECTED
Influenza A H3: DETECTED — AB
Influenza A: DETECTED — AB
Influenza B: NOT DETECTED
Metapneumovirus: NOT DETECTED
Parainfluenza 1: NOT DETECTED
Parainfluenza 2: NOT DETECTED
Parainfluenza 3: NOT DETECTED
Respiratory Syncytial Virus A: NOT DETECTED
Respiratory Syncytial Virus B: NOT DETECTED
Rhinovirus: NOT DETECTED

## 2013-05-31 LAB — COMPREHENSIVE METABOLIC PANEL
ALT: 19 U/L (ref 0–35)
AST: 22 U/L (ref 0–37)
Albumin: 2.8 g/dL — ABNORMAL LOW (ref 3.5–5.2)
Alkaline Phosphatase: 115 U/L (ref 39–117)
BUN: 16 mg/dL (ref 6–23)
CO2: 22 mEq/L (ref 19–32)
Calcium: 8.5 mg/dL (ref 8.4–10.5)
Chloride: 103 mEq/L (ref 96–112)
Creatinine, Ser: 0.95 mg/dL (ref 0.50–1.10)
GFR calc Af Amer: 77 mL/min — ABNORMAL LOW (ref >90)
GFR calc non Af Amer: 67 mL/min — ABNORMAL LOW (ref >90)
Glucose, Bld: 136 mg/dL — ABNORMAL HIGH (ref 70–99)
Potassium: 4.4 mEq/L (ref 3.7–5.3)
Sodium: 139 mEq/L (ref 137–147)
Total Bilirubin: <0.2 mg/dL — ABNORMAL LOW (ref 0.3–1.2)
Total Protein: 7.3 g/dL (ref 6.0–8.3)

## 2013-05-31 LAB — TROPONIN I
Troponin I: <0.3 ng/mL (ref <0.30)
Troponin I: <0.3 ng/mL (ref <0.30)

## 2013-05-31 LAB — MRSA PCR SCREENING: MRSA by PCR: NEGATIVE

## 2013-05-31 MED ORDER — ASPIRIN 325 MG PO TABS
975.0000 mg | ORAL_TABLET | ORAL | Status: DC | PRN
  Filled 2013-05-31: qty 3

## 2013-05-31 MED ORDER — OSELTAMIVIR PHOSPHATE 75 MG PO CAPS
75.0000 mg | ORAL_CAPSULE | Freq: Two times a day (BID) | ORAL | Status: DC
  Administered 2013-05-31 – 2013-06-02 (×4): 75 mg via ORAL
  Filled 2013-05-31 (×5): qty 1

## 2013-05-31 MED ORDER — ALBUTEROL SULFATE (2.5 MG/3ML) 0.083% IN NEBU
2.5000 mg | INHALATION_SOLUTION | RESPIRATORY_TRACT | Status: DC | PRN
Start: 2013-05-31 — End: 2013-06-02
  Filled 2013-05-31: qty 3

## 2013-05-31 MED ORDER — ALBUTEROL SULFATE (2.5 MG/3ML) 0.083% IN NEBU
2.5000 mg | INHALATION_SOLUTION | Freq: Four times a day (QID) | RESPIRATORY_TRACT | Status: DC
Start: 2013-05-31 — End: 2013-05-31
  Administered 2013-05-31 (×2): 2.5 mg via RESPIRATORY_TRACT
  Filled 2013-05-31 (×2): qty 3

## 2013-05-31 MED ORDER — IPRATROPIUM-ALBUTEROL 0.5-2.5 (3) MG/3ML IN SOLN
3.0000 mL | Freq: Four times a day (QID) | RESPIRATORY_TRACT | Status: DC
  Administered 2013-05-31 – 2013-06-02 (×6): 3 mL via RESPIRATORY_TRACT
  Filled 2013-05-31 (×33): qty 3

## 2013-05-31 MED ORDER — IPRATROPIUM BROMIDE 0.02 % IN SOLN
0.5000 mg | Freq: Four times a day (QID) | RESPIRATORY_TRACT | Status: DC
  Administered 2013-05-31 (×2): 0.5 mg via RESPIRATORY_TRACT
  Filled 2013-05-31 (×2): qty 2.5

## 2013-05-31 MED ORDER — METHYLPREDNISOLONE SODIUM SUCC 125 MG IJ SOLR
60.0000 mg | Freq: Three times a day (TID) | INTRAMUSCULAR | Status: DC
  Administered 2013-05-31 – 2013-06-01 (×3): 60 mg via INTRAVENOUS
  Filled 2013-05-31 (×8): qty 0.96

## 2013-05-31 NOTE — Progress Notes (Signed)
[facility] Progress Note [name] TEAM 1 - Stepdown/ICU TEAM   Brenda [name] JWJ:[id number] DOB: 10-10-1960 DOA: 05/30/2013 PCP: [name], MD  Admit HPI / Brief Narrative: 52 y.o. F w/ Hx Bipolar disorder, chronic pain syndrome, HTN, hypertriglyceridemia, nicotine addiction, chronic obstructive bronchitis, reactive airway disease, and emphysema who presented to ED with a chief complaint of SOB for 2 days, chest tightness, and wheezing. States has been unable to sleep flat since her symptoms started, has been sleeping in a recliner. She has been using her Albuterol inhaler at home without relief. She states that she had a fever of 101. She was also complaining of body aches. She reported that she has been hospitalized in the past for COPD exacerbations, but has never been intubated. She is a previous smoker.  HPI/Subjective: Feels that her breathing is slightly improved though nowhere near baseline just yet.  She denies chest pain nausea vomiting or abdominal pain.  She states that her low back pain is currently reasonably well controlled.  Assessment/Plan:  COPD exacerbation Slowly improving - ?componenet of vocal cord dysfunction - cont maximal med care - not yet ready to taper steroids  Bipolar D/O Currently on no medication  HTN BP currently well controlled  Chronic low back pain syndrome  Continue Neurontin, Cymbalta  Obesity - Body mass index is 45.98 kg/(m^2).  Code Status: FULL Family Communication: Spoke with patient and husband at bedside  Disposition Plan: Transfer to a medical bed  Consultants: None  Procedures: None  Antibiotics: Levaquin 12/30 >>  DVT prophylaxis: Lovenox  Objective: Blood pressure 112/56, pulse 92, temperature 98.4 F (36.9 C), temperature source Oral, resp. rate 15, height 5\' 3"  (1.6 m), weight 117.7 kg (259 lb 7.7 oz), SpO2 98.00%.  Intake/Output Summary (Last 24 hours) at 05/31/13 1046 Last data filed at 05/31/13  0900  Gross per 24 hour  Intake    615 ml  Output    250 ml  Net    365 ml    Exam: General: Expiratory wheezing audible without stethoscope - not tachypneic Lungs: Diffuse expiratory wheezing with poor air movement - no focal crackles Cardiovascular: Regular rate and rhythm without murmur gallop or rub normal S1 and S2 Abdomen: Obese. nontender, nondistended, soft, bowel sounds positive, no rebound, no ascites, no appreciable mass Extremities: No significant cyanosis, or clubbing;  1+ edema bilateral lower extremities  Data Reviewed: Basic Metabolic Panel:  Recent Labs Lab 05/30/13 1200 05/31/13 0035  NA 138 139  K 3.8 4.4  CL 100 103  CO2 25 22  GLUCOSE 118* 136*  BUN 11 16  CREATININE 0.78 0.95  CALCIUM 8.7 8.5  MG  --  2.5   Liver Function Tests:  Recent Labs Lab 05/31/13 0035  AST 22  ALT 19  ALKPHOS 115  BILITOT <0.2*  PROT 7.3  ALBUMIN 2.8*   CBC:  Recent Labs Lab 05/30/13 1200  WBC 8.1  HGB 13.9  HCT 41.8  MCV 92.1  PLT 276   Cardiac Enzymes:  Recent Labs Lab 05/30/13 1859 05/31/13 0035 05/31/13 0700  TROPONINI <0.30 <0.30 <0.30   BNP (last 3 results)  Recent Labs  02/24/13 1140 05/30/13 1200 05/30/13 1859  PROBNP 104.2 158.5* 125.3*    Recent Results (from the past 240 hour(s))  CULTURE, BLOOD (ROUTINE X 2)     Status: None   Collection Time    05/30/13  3:09 PM      Result Value Range Status   Specimen Description BLOOD  RIGHT FOREARM   Final   Special Requests BOTTLES DRAWN AEROBIC AND ANAEROBIC 3CC   Final   Culture  Setup Time     Final   Value: 05/30/2013 23:38     Performed at [facility]   Culture     Final   Value:        BLOOD CULTURE RECEIVED NO GROWTH TO DATE CULTURE WILL BE HELD FOR 5 DAYS BEFORE ISSUING A FINAL NEGATIVE REPORT     Performed at [facility]   Report Status PENDING   Incomplete  CULTURE, BLOOD (ROUTINE X 2)     Status: None   Collection Time    05/30/13  3:30 PM      Result  Value Range Status   Specimen Description BLOOD LEFT HAND   Final   Special Requests BOTTLES DRAWN AEROBIC ONLY 2CC   Final   Culture  Setup Time     Final   Value: 05/30/2013 22:49     Performed at [facility]   Culture     Final   Value:        BLOOD CULTURE RECEIVED NO GROWTH TO DATE CULTURE WILL BE HELD FOR 5 DAYS BEFORE ISSUING A FINAL NEGATIVE REPORT     Performed at [facility]   Report Status PENDING   Incomplete  MRSA PCR SCREENING     Status: None   Collection Time    05/30/13 10:19 PM      Result Value Range Status   MRSA by PCR NEGATIVE  NEGATIVE Final   Comment:            The GeneXpert MRSA Assay (FDA     approved for NASAL specimens     only), is one component of a     comprehensive MRSA colonization     surveillance program. It is not     intended to diagnose MRSA     infection nor to guide or     monitor treatment for     MRSA infections.     Studies:  Recent x-ray studies have been reviewed in detail by the Attending Physician  Scheduled Meds:  Scheduled Meds: . DULoxetine  60 mg Oral Daily  . enoxaparin (LOVENOX) injection  40 mg Subcutaneous Q24H  . gabapentin  1,200 mg Oral BID  . ipratropium-albuterol  3 mL Nebulization Q6H  . levofloxacin (LEVAQUIN) IV  750 mg Intravenous Q24H  . lisinopril  2.5 mg Oral Daily  . methylPREDNISolone (SOLU-MEDROL) injection  125 mg Intravenous Q24H  . topiramate  100 mg Oral BID    Time spent on care of this patient: 35 mins   [facility] T  [facility] Office  [phone] Pager - Text Page per Loretha Stapler as per below:  On-Call/Text Page:      [email or web address]      password [email or web address]  If 7PM-7AM, please contact night-coverage [email or web address] Password [email or web address] 05/31/2013, 10:46 AM   LOS: 1 day

## 2013-05-31 NOTE — Progress Notes (Signed)
Report called to Victorino Dike, RN, all questions answered. Patient notified family of transfer and room number.

## 2013-05-31 NOTE — Progress Notes (Signed)
UR completed 

## 2013-06-01 DIAGNOSIS — J09X2 Influenza due to identified novel influenza A virus with other respiratory manifestations: Secondary | ICD-10-CM

## 2013-06-01 MED ORDER — LEVOFLOXACIN 750 MG PO TABS
750.0000 mg | ORAL_TABLET | Freq: Every day | ORAL | Status: DC
  Administered 2013-06-01 – 2013-06-02 (×2): 750 mg via ORAL
  Filled 2013-06-01 (×2): qty 1

## 2013-06-01 MED ORDER — METHYLPREDNISOLONE SODIUM SUCC 125 MG IJ SOLR
60.0000 mg | Freq: Two times a day (BID) | INTRAMUSCULAR | Status: DC
  Administered 2013-06-01 – 2013-06-02 (×2): 60 mg via INTRAVENOUS
  Filled 2013-06-01 (×2): qty 0.96

## 2013-06-01 NOTE — Progress Notes (Signed)
[facility] Progress Note [place] TEAM 1 - Stepdown/ICU TEAM   Brenda [name] IZT:[id number] DOB: 11-25-1960 DOA: 05/30/2013 PCP: Ria Bush, MD  Admit HPI / Brief Narrative: 53 y.o. F w/ Hx Bipolar disorder, chronic pain syndrome, HTN, hypertriglyceridemia, nicotine addiction, chronic obstructive bronchitis, reactive airway disease, and emphysema who presented to ED with a chief complaint of SOB for 2 days, chest tightness, and wheezing. States has been unable to sleep flat since her symptoms started, has been sleeping in a recliner. She has been using her Albuterol inhaler at home without relief. She states that she had a fever of 101. She was also complaining of body aches. She reported that she has been hospitalized in the past for COPD exacerbations, but has never been intubated. She is a previous smoker.  HPI/Subjective: Breathing better, just not back to baseline  Assessment/Plan: Flu A: tamiflu day #1  COPD exacerbation Wean down steroids slowly  Bipolar D/O Currently on no medication  HTN BP currently well controlled  Chronic low back pain syndrome  Continue Neurontin, Cymbalta  Obesity - Body mass index is 45.98 kg/(m^2).  Code Status: FULL Family Communication: Spoke with patient and husband at bedside  Disposition Plan: home 1-2 days  Consultants: None  Procedures: None  Antibiotics: Levaquin 12/30 >>  DVT prophylaxis: Lovenox  Objective: Blood pressure 134/62, pulse 73, temperature 98.8 F (37.1 C), temperature source Oral, resp. rate 20, height _0  (1.6 m), weight 117.7 kg (259 lb 7.7 oz), SpO2 95.00%.  Intake/Output Summary (Last 24 hours) at 06/01/13 1343 Last data filed at 06/01/13 0900  Gross per 24 hour  Intake    100 ml  Output   1400 ml  Net  -1300 ml    Exam: General: up taking a bath Lungs: decreased breath sounds, mild wheezing Cardiovascular: Regular rate and rhythm without murmur gallop or rub normal S1 and  S2 Abdomen: Obese. nontender, nondistended, soft, bowel sounds positive, no rebound, no ascites, no appreciable mass Extremities: No significant cyanosis, or clubbing;  1+ edema bilateral lower extremities  Data Reviewed: Basic Metabolic Panel:  Recent Labs Lab 05/30/13 1200 05/31/13 0035  NA 138 139  K 3.8 4.4  CL 100 103  CO2 25 22  GLUCOSE 118* 136*  BUN 11 16  CREATININE 0.78 0.95  CALCIUM 8.7 8.5  MG  --  2.5   Liver Function Tests:  Recent Labs Lab 05/31/13 0035  AST 22  ALT 19  ALKPHOS 115  BILITOT <0.2*  PROT 7.3  ALBUMIN 2.8*   CBC:  Recent Labs Lab 05/30/13 1200  WBC 8.1  HGB 13.9  HCT 41.8  MCV 92.1  PLT 276   Cardiac Enzymes:  Recent Labs Lab 05/30/13 1859 05/31/13 0035 05/31/13 0700  TROPONINI <0.30 <0.30 <0.30   BNP (last 3 results)  Recent Labs  02/24/13 1140 05/30/13 1200 05/30/13 1859  PROBNP 104.2 158.5* 125.3*    Recent Results (from the past 240 hour(s))  CULTURE, BLOOD (ROUTINE X 2)     Status: None   Collection Time    05/30/13  3:09 PM      Result Value Range Status   Specimen Description BLOOD RIGHT FOREARM   Final   Special Requests BOTTLES DRAWN AEROBIC AND ANAEROBIC 3CC   Final   Culture  Setup Time     Final   Value: 05/30/2013 23:38     Performed at [facility]   Culture     Final   Value:  BLOOD CULTURE RECEIVED NO GROWTH TO DATE CULTURE WILL BE HELD FOR 5 DAYS BEFORE ISSUING A FINAL NEGATIVE REPORT     Performed at [facility]   Report Status PENDING   Incomplete  CULTURE, BLOOD (ROUTINE X 2)     Status: None   Collection Time    05/30/13  3:30 PM      Result Value Range Status   Specimen Description BLOOD LEFT HAND   Final   Special Requests BOTTLES DRAWN AEROBIC ONLY 2CC   Final   Culture  Setup Time     Final   Value: 05/30/2013 22:49     Performed at [facility]   Culture     Final   Value:        BLOOD CULTURE RECEIVED NO GROWTH TO DATE CULTURE WILL BE HELD FOR 5  DAYS BEFORE ISSUING A FINAL NEGATIVE REPORT     Performed at [facility]   Report Status PENDING   Incomplete  RESPIRATORY VIRUS PANEL     Status: Abnormal   Collection Time    05/30/13 10:19 PM      Result Value Range Status   Source - RVPAN NASOPHARYNGEAL   Final   Respiratory Syncytial Virus A NOT DETECTED   Final   Respiratory Syncytial Virus B NOT DETECTED   Final   Influenza A DETECTED (*)  Final   Influenza B NOT DETECTED   Final   Parainfluenza 1 NOT DETECTED   Final   Parainfluenza 2 NOT DETECTED   Final   Parainfluenza 3 NOT DETECTED   Final   Metapneumovirus NOT DETECTED   Final   Rhinovirus NOT DETECTED   Final   Adenovirus NOT DETECTED   Final   Influenza A H1 NOT DETECTED   Final   Influenza A H3 DETECTED (*)  Final   Comment: (NOTE)           Normal Reference Range for each Analyte: NOT DETECTED     Testing performed using the Luminex xTAG Respiratory Viral Panel test     kit.     This test was developed and its performance characteristics determined     by [facility]. It has not been cleared or approved by the [place]     Food and Drug Administration. This test is used for clinical purposes.     It should not be regarded as investigational or for research. This     laboratory is certified under the [place] (CLIA) as qualified to perform high complexity     clinical laboratory testing.     Performed at [facility] PCR SCREENING     Status: None   Collection Time    05/30/13 10:19 PM      Result Value Range Status   MRSA by PCR NEGATIVE  NEGATIVE Final   Comment:            The GeneXpert MRSA Assay (FDA     approved for NASAL specimens     only), is one component of a     comprehensive MRSA colonization     surveillance program. It is not     intended to diagnose MRSA     infection nor to guide or     monitor treatment for     MRSA infections.     Studies:  Recent x-ray studies  have been reviewed in detail by  the Attending Physician  Scheduled Meds:  Scheduled Meds: . DULoxetine  60 mg Oral Daily  . enoxaparin (LOVENOX) injection  40 mg Subcutaneous Q24H  . gabapentin  1,200 mg Oral BID  . ipratropium-albuterol  3 mL Nebulization QID  . levofloxacin (LEVAQUIN) IV  750 mg Intravenous Q24H  . lisinopril  2.5 mg Oral Daily  . methylPREDNISolone (SOLU-MEDROL) injection  60 mg Intravenous Q12H  . oseltamivir  75 mg Oral BID  . topiramate  100 mg Oral BID    Time spent on care of this patient: 35 mins   Eulogio Bear [phone]  [facility] Office  [phone] Pager - Text Page per Shea Evans as per below:  On-Call/Text Page:      [email or web address]      password [email or web address]  If 7PM-7AM, please contact night-coverage [email or web address] Password [email or web address] 06/01/2013, 1:43 PM   LOS: 2 days

## 2013-06-01 NOTE — Progress Notes (Signed)
Patient oxygen saturation was 99% with 2L and 99% RA. She ambulated without oxygen and her oxygen saturation was between 92 and 97%. Tolerated it well with slightly exhaustion. Will continue to monitor.

## 2013-06-02 ENCOUNTER — Telehealth: Payer: Self-pay | Admitting: Family Medicine

## 2013-06-02 DIAGNOSIS — J09X2 Influenza due to identified novel influenza A virus with other respiratory manifestations: Secondary | ICD-10-CM

## 2013-06-02 DIAGNOSIS — J441 Chronic obstructive pulmonary disease with (acute) exacerbation: Secondary | ICD-10-CM

## 2013-06-02 DIAGNOSIS — I10 Essential (primary) hypertension: Secondary | ICD-10-CM

## 2013-06-02 MED ORDER — OSELTAMIVIR PHOSPHATE 75 MG PO CAPS: 75.0000 mg | ORAL_CAPSULE | Freq: Two times a day (BID) | ORAL | Status: DC

## 2013-06-02 MED ORDER — LEVOFLOXACIN 750 MG PO TABS: 750.0000 mg | ORAL_TABLET | Freq: Every day | ORAL | Status: DC

## 2013-06-02 MED ORDER — PREDNISONE 10 MG PO TABS
ORAL_TABLET | ORAL | Status: DC
Start: 2013-06-02 — End: 2013-06-08

## 2013-06-02 NOTE — Discharge Summary (Signed)
Physician Discharge Summary  Brenda [name] WGY:[id number] DOB: [birth year] DOA: 05/30/2013  PCP: Ria Bush, MD  Admit date: 05/30/2013 Discharge date: 06/02/2013  Time spent: 35 minutes  Recommendations for Outpatient Follow-up:  1.   Discharge Diagnoses:  Active Problems:   HYPERTRIGLYCERIDEMIA   LEUKOCYTOSIS UNSPECIFIED   BIPOLAR AFFECTIVE DISORDER, MANIC   NICOTINE ADDICTION   HYPERTENSION, BENIGN ESSENTIAL   BRONCHITIS, OBSTRUCTIVE CHRONIC   EMPHYSEMA   REACTIVE AIRWAY DISEASE   COPD exacerbation   Chronic pain syndrome   Migraine, unspecified, without mention of intractable migraine without mention of status migrainosus   Discharge Condition: improved  Diet recommendation: cardiac  Filed Weights   05/30/13 1702  Weight: 117.7 kg (259 lb 7.7 oz)    History of present illness:  Brenda [name] is a 53 y.o. WF PMHx Bipolar disorder, chronic pain syndrome, HTN, hypertriglyceridemia, nicotine addiction, chronic obstructive bronchitis, reactive airway disease, emphysema. Presented to [facility] complaint of SOB started on Sunday, chest tightness, and wheezing. She reports that her symptoms began two days ago and have been gradually worsening since that time. States has been unable to sleep flat since her symptoms started, has been sleeping in the recliner. She has been using her Albuterol inhaler at home without relief. She states that she has been using 2 puffs every six hours. She reports that she has also had a productive cough for the past 2 days. She states that yesterday she had a fever of 101 F orally, but no fever since that time. She is also complaining of body aches. She denies chest pain, but does have some tightness in her chest. She reports that her symptoms feel similar to when she has had a COPD exacerbation in the past. She reports that she has been hospitalized in the past for COPD exacerbations, but has never been intubated. She is a previous smoker. Quit smoking  three months ago. No known sick contacts.    Hospital Course:  Flu A: tamiflu day #1   COPD exacerbation  Wean down steroids slowly   Bipolar D/O  Currently on no medication   HTN  BP currently well controlled   Chronic low back pain syndrome  Continue Neurontin, Cymbalta   Obesity - Body mass index is 45.98 kg/(m^2).   Procedures:    Consultations:  none  Discharge Exam: Filed Vitals:   06/02/13 1023  BP: 116/70  Pulse: 77  Temp: 98.2 F (36.8 C)  Resp: 20    General: A+Ox3, NAD Cardiovascular: rrr Respiratory: clearer, mild exp wheezing  Discharge Instructions  Discharge Orders   Future Orders Complete By Expires   Diet - low sodium heart healthy  As directed    Discharge instructions  As directed    Comments:     May return to work if feeling better and no other restrictions place by PCP on 1/12   Increase activity slowly  As directed        Medication List         albuterol 108 (90 BASE) MCG/ACT inhaler  Commonly known as:  PROVENTIL HFA;VENTOLIN HFA  Inhale 2 puffs into the lungs every 6 (six) hours as needed for wheezing.     aspirin 325 MG tablet  Take 975 mg by mouth every 4 (four) hours as needed for mild pain.     CYMBALTA 60 MG capsule  Generic drug:  DULoxetine  Take 60 mg by mouth daily.     gabapentin 600 MG tablet  Commonly known as:  NEURONTIN  Take 1,200 mg by mouth 2 (two) times daily.     levofloxacin 750 MG tablet  Commonly known as:  LEVAQUIN  Take 1 tablet (750 mg total) by mouth daily.     lisinopril 2.5 MG tablet  Commonly known as:  PRINIVIL,ZESTRIL  Take 2.5 mg by mouth daily.     oseltamivir 75 MG capsule  Commonly known as:  TAMIFLU  Take 1 capsule (75 mg total) by mouth 2 (two) times daily.     predniSONE 10 MG tablet  Commonly known as:  DELTASONE  50 mg x 2 days, 40 mg x 2 days, 30 mg x 2 days, 10 mg x2 days 5 mg x 2 days and d/c     topiramate 100 MG tablet  Commonly known as:  TOPAMAX  Take 100  mg by mouth 2 (two) times daily.       Allergies  Allergen Reactions  . Metolazone     REACTION: metabolic mood swings       Follow-up Information   Follow up with Ria Bush, MD In 1 week.   Specialty:  Family Medicine   Contact information:   [place] [id number] [phone]        The results of significant diagnostics from this hospitalization (including imaging, microbiology, ancillary and laboratory) are listed below for reference.    Significant Diagnostic Studies: Dg Chest Portable 1 View  05/30/2013   CLINICAL DATA:  Asthma.  Short of breath.  EXAM: PORTABLE CHEST - 1 VIEW  COMPARISON:  02/24/2013  FINDINGS: Artifact overlies the chest. Heart size is normal. Mediastinal shadows are normal. The lungs are clear. The vascularity is normal. No bony abnormality seen.  IMPRESSION: No active disease   Electronically Signed   By: Nelson Chimes M.D.   On: 05/30/2013 12:41    Microbiology: Recent Results (from the past 240 hour(s))  CULTURE, BLOOD (ROUTINE X 2)     Status: None   Collection Time    05/30/13  3:09 PM      Result Value Range Status   Specimen Description BLOOD RIGHT FOREARM   Final   Special Requests BOTTLES DRAWN AEROBIC AND ANAEROBIC 3CC   Final   Culture  Setup Time     Final   Value: 05/30/2013 23:38     Performed at [facility]   Culture     Final   Value:        BLOOD CULTURE RECEIVED NO GROWTH TO DATE CULTURE WILL BE HELD FOR 5 DAYS BEFORE ISSUING A FINAL NEGATIVE REPORT     Performed at [facility]   Report Status PENDING   Incomplete  CULTURE, BLOOD (ROUTINE X 2)     Status: None   Collection Time    05/30/13  3:30 PM      Result Value Range Status   Specimen Description BLOOD LEFT HAND   Final   Special Requests BOTTLES DRAWN AEROBIC ONLY 2CC   Final   Culture  Setup Time     Final   Value: 05/30/2013 22:49     Performed at [facility]   Culture     Final   Value:        BLOOD  CULTURE RECEIVED NO GROWTH TO DATE CULTURE WILL BE HELD FOR 5 DAYS BEFORE ISSUING A FINAL NEGATIVE REPORT     Performed at [facility]   Report Status PENDING   Incomplete  RESPIRATORY  VIRUS PANEL     Status: Abnormal   Collection Time    05/30/13 10:19 PM      Result Value Range Status   Source - RVPAN NASOPHARYNGEAL   Final   Respiratory Syncytial Virus A NOT DETECTED   Final   Respiratory Syncytial Virus B NOT DETECTED   Final   Influenza A DETECTED (*)  Final   Influenza B NOT DETECTED   Final   Parainfluenza 1 NOT DETECTED   Final   Parainfluenza 2 NOT DETECTED   Final   Parainfluenza 3 NOT DETECTED   Final   Metapneumovirus NOT DETECTED   Final   Rhinovirus NOT DETECTED   Final   Adenovirus NOT DETECTED   Final   Influenza A H1 NOT DETECTED   Final   Influenza A H3 DETECTED (*)  Final   Comment: (NOTE)           Normal Reference Range for each Analyte: NOT DETECTED     Testing performed using the Luminex xTAG Respiratory Viral Panel test     kit.     This test was developed and its performance characteristics determined     by [facility]. It has not been cleared or approved by the [place]     Food and Drug Administration. This test is used for clinical purposes.     It should not be regarded as investigational or for research. This     laboratory is certified under the [place] (CLIA) as qualified to perform high complexity     clinical laboratory testing.     Performed at [facility] SCREENING     Status: None   Collection Time    05/30/13 10:19 PM      Result Value Range Status   MRSA by PCR NEGATIVE  NEGATIVE Final   Comment:            The GeneXpert MRSA Assay (FDA     approved for NASAL specimens     only), is one component of a     comprehensive MRSA colonization     surveillance program. It is not     intended to diagnose MRSA     infection nor to guide or     monitor treatment for      MRSA infections.     Labs: Basic Metabolic Panel:  Recent Labs Lab 05/30/13 1200 05/31/13 0035  NA 138 139  K 3.8 4.4  CL 100 103  CO2 25 22  GLUCOSE 118* 136*  BUN 11 16  CREATININE 0.78 0.95  CALCIUM 8.7 8.5  MG  --  2.5   Liver Function Tests:  Recent Labs Lab 05/31/13 0035  AST 22  ALT 19  ALKPHOS 115  BILITOT <0.2*  PROT 7.3  ALBUMIN 2.8*   No results found for this basename: LIPASE, AMYLASE,  in the last 168 hours No results found for this basename: AMMONIA,  in the last 168 hours CBC:  Recent Labs Lab 05/30/13 1200  WBC 8.1  HGB 13.9  HCT 41.8  MCV 92.1  PLT 276   Cardiac Enzymes:  Recent Labs Lab 05/30/13 1859 05/31/13 0035 05/31/13 0700  TROPONINI <0.30 <0.30 <0.30   BNP: BNP (last 3 results)  Recent Labs  02/24/13 1140 05/30/13 1200 05/30/13 1859  PROBNP 104.2 158.5* 125.3*   CBG: No results found for this basename: GLUCAP,  in the last  168 hours     Signed:  Eulogio Bear  [facility] 06/02/2013, 12:02 PM

## 2013-06-02 NOTE — Telephone Encounter (Signed)
Pt admitted 12/30-1/2 with influenza leading to COPD exacerbation. plz complete transitional care f/u phone call and schedule appt in next 2 weeks.  Thanks.

## 2013-06-05 LAB — CULTURE, BLOOD (ROUTINE X 2)
Culture: NO GROWTH
Culture: NO GROWTH

## 2013-06-06 NOTE — Telephone Encounter (Signed)
Attempted Transitional Care Call.  Left vm for pt to return call.  Pt does have appt scheduled for f/u on 1/8.

## 2013-06-08 ENCOUNTER — Encounter: Payer: Self-pay | Admitting: Family Medicine

## 2013-06-08 ENCOUNTER — Ambulatory Visit (INDEPENDENT_AMBULATORY_CARE_PROVIDER_SITE_OTHER): Payer: Managed Care, Other (non HMO) | Admitting: Family Medicine

## 2013-06-08 VITALS — BP 140/90 | HR 80 | Temp 97.8°F | Wt 267.0 lb

## 2013-06-08 DIAGNOSIS — Z87891 Personal history of nicotine dependence: Secondary | ICD-10-CM

## 2013-06-08 DIAGNOSIS — R062 Wheezing: Secondary | ICD-10-CM

## 2013-06-08 DIAGNOSIS — J441 Chronic obstructive pulmonary disease with (acute) exacerbation: Secondary | ICD-10-CM

## 2013-06-08 MED ORDER — IPRATROPIUM BROMIDE 0.02 % IN SOLN
0.5000 mg | Freq: Once | RESPIRATORY_TRACT | Status: AC
  Administered 2013-06-08: 0.5 mg via RESPIRATORY_TRACT

## 2013-06-08 MED ORDER — ALBUTEROL SULFATE (2.5 MG/3ML) 0.083% IN NEBU
2.5000 mg | INHALATION_SOLUTION | Freq: Once | RESPIRATORY_TRACT | Status: AC
  Administered 2013-06-08: 2.5 mg via RESPIRATORY_TRACT

## 2013-06-08 MED ORDER — ALBUTEROL SULFATE (2.5 MG/3ML) 0.083% IN NEBU: 2.5000 mg | INHALATION_SOLUTION | Freq: Four times a day (QID) | RESPIRATORY_TRACT | Status: DC | PRN

## 2013-06-08 MED ORDER — PREDNISONE 20 MG PO TABS: ORAL_TABLET | ORAL | Status: DC

## 2013-06-08 NOTE — Assessment & Plan Note (Addendum)
Persistent COPD exacerbation without evidence of infection. Treated with albuterol/atrovent neb in office today x2 with improvement in wheezing, air movement, and dyspnea. I did increase prednisone taper - to 60mg  with slow taper off. I did provide with breo sample to start taking once done with oral prednisone given severity of current COPD exacerbation. Will likely suggest she start advair or symbicort depending on insurance coverage. I did ask her to return in 1-2 months to update spirometry once feeling better.

## 2013-06-08 NOTE — Progress Notes (Signed)
Subjective:    Patient ID: Brenda BarrenJudy L Lemanski, female    DOB: 01/12/1961, 53 y.o.   MRN: [id number]  HPI CC: hosp f/u  Pleasant 53 yo not seen here since 06/2012 presents for hospital f/u for recent influenza associated with COPD exacerbation.  Treated with tamiflu and prednisone taper.  Also treated with levaquin (finished yesterday).  Down to 30mg  prednisone per day, planning on decreasing to 10mg  tomorrow.  Has been using albuterol regularly since discharged from hospital.  Feeling fatigued, continued wheezing and dyspnea with exertion.  Has been on symbicort in past.  Requests refill of albuterol nebulizer.  Pt reports she quit smoking 01/2013.  Planning on receiving flu shot at work. Pneumovax 2002.  Admit date: 05/30/2013  Discharge date: 06/02/2013  F/u phone call attempted: 06/06/2012 Recommendations for Outpatient Follow-up:  DisDischarge Diagnoses:  Active Problems:  HYPERTRIGLYCERIDEMIA  LEUKOCYTOSIS UNSPECIFIED  BIPOLAR AFFECTIVE DISORDER, MANIC  NICOTINE ADDICTION  HYPERTENSION, BENIGN ESSENTIAL  BRONCHITIS, OBSTRUCTIVE CHRONIC  EMPHYSEMA  REACTIVE AIRWAY DISEASE  COPD exacerbation  Chronic pain syndrome  Migraine, unspecified, without mention of intractable migraine without mention of status migrainosus  Discharge Condition: improved  Diet recommendation: cardiac History of present illness:  Brenda [name] is a 53 y.o. WF PMHx Bipolar disorder, chronic pain syndrome, HTN, hypertriglyceridemia, nicotine addiction, chronic obstructive bronchitis, reactive airway disease, emphysema. [facility] started on Sunday, chest tightness, and wheezing. She reports that her symptoms began two days ago and have been gradually worsening since that time. States has been unable to sleep flat since her symptoms started, has been sleeping in the recliner. She has been using her Albuterol inhaler at home without relief. She states that she has been using 2 puffs every  six hours. She reports that she has also had a productive cough for the past 2 days. She states that yesterday she had a fever of 101 F orally, but no fever since that time. She is also complaining of body aches. She denies chest pain, but does have some tightness in her chest. She reports that her symptoms feel similar to when she has had a COPD exacerbation in the past. She reports that she has been hospitalized in the past for COPD exacerbations, but has never been intubated. She is a previous smoker. Quit smoking three months ago. No known sick contacts.  Hospital Course:  Flu A: tamiflu day #1  COPD exacerbation  Wean down steroids slowly  Bipolar D/O  Currently on no medication  HTN  BP currently well controlled  Chronic low back pain syndrome  Continue Neurontin, Cymbalta    Past Medical History  Diagnosis Date  . Bronchitis, chronic obstructive 2008 & 2009    FeV1 64% TLC 105% DLCO 55% 2008  -  FeV1 81% FeF 25-75 43% 2009  . Nonspecific abnormal electrocardiogram (ECG) (EKG)   . Leukocytosis, unspecified   . HTN (hypertension)   . Hx of migraines   . Bipolar I disorder, most recent episode (or current) manic, unspecified   . Pure hyperglyceridemia   . Acute pancreatitis   . COPD (chronic obstructive pulmonary disease)     emphysema  . Nicotine addiction   . Asthma   . Obesity   . Swelling of limb   . Knee pain, right   . Suicide attempt 06/2001  . History of pyelonephritis 05/2012  . Lumbar disc disease with radiculopathy     multilevel spondylitic changes, scoliosis, and anterolisthesis L4 not thought to currently  be surg candidate (Dr. Phoebe Perch, [facility]) (Dr. Ollen Bowl, [facility])  . Urge and stress incontinence 07/2012    ([facility])    Past Surgical History  Procedure Laterality Date  . Lumbar mri  11/2010    multilevel spondylitic changes with upper lumbar scoliosis and anterolisthesis of L4 on 5 with some L foraminal narrowing esp at L/3 with concavity of scoliosis, some  central stenosis and biforaminal narrowing at L4/5 with spondylolisthesis  . Cholecystectomy  07/1982  . Tubal ligation    . Retained stone      ERCP secondary to retained stone  . Right knee surgery  1984,1989,1989,1991,1994    Reconstructions for ACL insuff  . Ercp / sphincterotomy - stenosis  01/15/06  . Ct maxillofacial wo/w cm  06/14/06    Left max mucocele  . Ct of chest  06/14/2006    Normal except c/w active inflammation / infection.  Left renal atrophy  . [facility]ott - pneumonia, acute asthma, left maxillary sinusitis  01/11 - 06/18/2006    Review of Systems Per HPI    Objective:   Physical Exam  Nursing note and vitals reviewed. Constitutional: She appears well-developed and well-nourished. No distress.  Morbidly obese Body mass index is 47.31 kg/(m^2).   HENT:  Head: Normocephalic and atraumatic.  Mouth/Throat: Oropharynx is clear and moist. No oropharyngeal exudate.  Cold sores on lips  Eyes: Conjunctivae and EOM are normal. Pupils are equal, round, and reactive to light.  Cardiovascular: Normal rate, regular rhythm, normal heart sounds and intact distal pulses.   No murmur heard. Pulmonary/Chest: No respiratory distress. She has decreased breath sounds (tight air movement). She has wheezes (diffuse insp/exp). She has no rhonchi. She has no rales.  Significant wheezing even at rest   After first alb/atrovent neb - improved air movement but persistent wheezing and dyspnea. After second alb/atrovent neb - improved air movement with mild exp wheezing, no dyspnea, able to speak in complete sentences.    Assessment & Plan:

## 2013-06-08 NOTE — Patient Instructions (Addendum)
Extend prednisone taper - sent into pharmacy. Continue albuterol inhaler every 6 hours for next several days. Please return or seen urgent care if breathing is not improving. Return in 1 month for spirometry and discussion of further med. In interim, start sample of breo one inhalation daily provided today - start after you're done with steroid taper.

## 2013-06-08 NOTE — Progress Notes (Signed)
Pre-visit discussion using our clinic review tool. No additional management support is needed unless otherwise documented below in the visit note.  

## 2013-06-09 ENCOUNTER — Encounter: Payer: Self-pay | Admitting: Family Medicine

## 2013-06-09 NOTE — Assessment & Plan Note (Signed)
Congratulated on quitting smoking.   

## 2013-06-13 ENCOUNTER — Telehealth: Payer: Self-pay | Admitting: Family Medicine

## 2013-06-13 NOTE — Telephone Encounter (Signed)
FMLA forms for pt were dropped off. I have completed the majority of them, with the exception of some areas that are flagged and need Dr. Nicanor AlconGutierrez's review. I have placed the forms in a green folder and the folder is on your desk. Will you please pass these on to Dr. Sharen HonesGutierrez and once he's reviewed/completed/signed them, you can return to me. Thank you.

## 2013-06-13 NOTE — Telephone Encounter (Signed)
Forms in your IN box for completion. 

## 2013-06-14 DIAGNOSIS — Z0279 Encounter for issue of other medical certificate: Secondary | ICD-10-CM

## 2013-06-14 NOTE — Telephone Encounter (Signed)
Filled out and placed in my out box. 

## 2013-06-26 ENCOUNTER — Telehealth: Payer: Self-pay | Admitting: Family Medicine

## 2013-06-26 NOTE — Telephone Encounter (Signed)
Pt's husband brought pt's FMLA forms back and says she is requesting that we change the return to work date from 06/19/2013 to 06/12/2013 then initial the change.  There were no notes in pt's chart saying she could return sooner, but pt's husband says you told her she could return sooner if she felt up to it.  Is this ok to change? I have placed the forms in a green folder and put the folder in your in-box. Please return to me once complete. Thank you.

## 2013-06-26 NOTE — Telephone Encounter (Signed)
Ok to change.  Made change and placed in my outbox.

## 2013-06-27 NOTE — Telephone Encounter (Signed)
Notified pt's husband that form has been revised and left at the front.

## 2013-07-10 ENCOUNTER — Ambulatory Visit (INDEPENDENT_AMBULATORY_CARE_PROVIDER_SITE_OTHER): Payer: Managed Care, Other (non HMO) | Admitting: Family Medicine

## 2013-07-10 ENCOUNTER — Encounter: Payer: Self-pay | Admitting: Family Medicine

## 2013-07-10 VITALS — BP 130/76 | HR 92 | Temp 98.2°F | Wt 269.0 lb

## 2013-07-10 DIAGNOSIS — J449 Chronic obstructive pulmonary disease, unspecified: Secondary | ICD-10-CM

## 2013-07-10 DIAGNOSIS — J438 Other emphysema: Secondary | ICD-10-CM

## 2013-07-10 MED ORDER — ALBUTEROL SULFATE (2.5 MG/3ML) 0.083% IN NEBU
2.5000 mg | INHALATION_SOLUTION | Freq: Once | RESPIRATORY_TRACT | Status: AC
  Administered 2013-07-10: 2.5 mg via RESPIRATORY_TRACT

## 2013-07-10 MED ORDER — BUDESONIDE-FORMOTEROL FUMARATE 160-4.5 MCG/ACT IN AERO: 2.0000 | INHALATION_SPRAY | Freq: Two times a day (BID) | RESPIRATORY_TRACT | Status: DC

## 2013-07-10 MED ORDER — FLUTICASONE-SALMETEROL 250-50 MCG/DOSE IN AEPB: 1.0000 | INHALATION_SPRAY | Freq: Two times a day (BID) | RESPIRATORY_TRACT | Status: DC

## 2013-07-10 NOTE — Assessment & Plan Note (Addendum)
Spirometry today - moderately severe obstruction with some restriction (low FVC). Pre: FVC 66%, FEV1 58%, ratio 0.7.  Post: FVC 68%, FEV1 64%, ratio 0.76 I do think she has moderate COPD and recommended continued inhaled corticosteroid/LABA combination. advair 500/50 sample provided, and provided with script for symbicort vs advair to price out and start cheaper med. Return to clinic in 3-4 months for followup.  Has quit smoking.  Consider referral to pulm for eval of restrictive component.

## 2013-07-10 NOTE — Progress Notes (Signed)
BP 130/76  Pulse 92  Temp(Src) 98.2 F (36.8 C) (Oral)  Wt 269 lb (122.018 kg)  LMP 03/08/2013   CC: 1 mo f/u spirometry  Subjective:    Patient ID: Brenda [name], female    DOB: 1961/02/09, 53 y.o.   MRN: [id number]  HPI: Brenda [name] is a 53 y.o. female presenting on 07/10/2013 with Follow-up  I asked Brenda [name] to come in today to update spirometry. Recent hospitalization for COPD exacerbation in setting of acute influenza infection. Ran out of breo last night.  Feels breathing back to baseline. Had been on symbicort in the past.  Touch of stomach bug over last 2 days - nauseated.  No vomiting.  + diarrhea all day long yesterday.  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  Declines med for this today.    Quit smoking 01/2013.  Relevant past medical, surgical, family and social history reviewed and updated. Allergies and medications reviewed and updated. Current Outpatient Prescriptions on File Prior to Visit  Medication Sig  . albuterol (PROVENTIL HFA;VENTOLIN HFA) 108 (90 BASE) MCG/ACT inhaler Inhale 2 puffs into the lungs every 6 (six) hours as needed for wheezing.  Marland Kitchen albuterol (PROVENTIL) (2.5 MG/3ML) 0.083% nebulizer solution Take 3 mLs (2.5 mg total) by nebulization every 6 (six) hours as needed for wheezing or shortness of breath.  . DULoxetine (CYMBALTA) 60 MG capsule Take 60 mg by mouth daily.  Marland Kitchen gabapentin (NEURONTIN) 600 MG tablet Take 1,200 mg by mouth 2 (two) times daily.   Marland Kitchen lisinopril (PRINIVIL,ZESTRIL) 2.5 MG tablet Take 2.5 mg by mouth daily.  Marland Kitchen topiramate (TOPAMAX) 100 MG tablet Take 100 mg by mouth 2 (two) times daily.  . [DISCONTINUED] hydrochlorothiazide (HYDRODIURIL) 25 MG tablet Take 25 mg by mouth daily.    . [DISCONTINUED] levalbuterol (XOPENEX) 0.63 MG/3ML nebulizer solution Take 1 ampule by nebulization every 6 (six) hours as needed.     No current facility-administered medications on file prior to visit.    Review of Systems Per HPI unless  specifically indicated above    Objective:    BP 130/76  Pulse 92  Temp(Src) 98.2 F (36.8 C) (Oral)  Wt 269 lb (122.018 kg)  LMP 03/08/2013  Physical Exam  Nursing note and vitals reviewed. Constitutional: She appears well-developed and well-nourished. No distress.  HENT:  Mouth/Throat: Oropharynx is clear and moist. No oropharyngeal exudate.  Cardiovascular: Normal rate, regular rhythm, normal heart sounds and intact distal pulses.   No murmur heard. Pulmonary/Chest: Effort normal and breath sounds normal. No respiratory distress. She has no wheezes. She has no rales.  Few coarse rhonchi bilaterally  Psychiatric: She has a normal mood and affect.   After alb/atrovent neb, no wheezing appreciated.    Assessment & Plan:   Problem List Items Addressed This Visit   BRONCHITIS, OBSTRUCTIVE CHRONIC     Spirometry today - moderately severe obstruction with some restriction (low FVC). Pre: FVC 66%, FEV1 58%, ratio 0.7.  Post: FVC 68%, FEV1 64%, ratio 0.76 I do think she has moderate COPD and recommended continued inhaled corticosteroid/LABA combination. advair 500/50 sample provided, and provided with script for symbicort vs advair to price out and start cheaper med. Return to clinic in 3-4 months for followup.  Has quit smoking.  Consider referral to pulm for eval of restrictive component.    Relevant Medications      albuterol (PROVENTIL) (2.5 MG/3ML) 0.083% nebulizer solution 2.5 mg (Completed)      Fluticasone-Salmeterol (ADVAIR DISKUS) 250-50  MCG/DOSE AEPB      budesonide-formoterol (SYMBICORT) 160-4.5 MCG/ACT inhaler   EMPHYSEMA    Other Visit Diagnoses   COPD (chronic obstructive pulmonary disease)    -  Primary    Relevant Orders       Spirometry w/ graph (Completed)        Follow up plan: Return in about 4 months (around 11/07/2013), or as needed, for follow up visit.

## 2013-07-10 NOTE — Progress Notes (Signed)
Pre-visit discussion using our clinic review tool. No additional management support is needed unless otherwise documented below in the visit note.  

## 2013-07-10 NOTE — Patient Instructions (Addendum)
Spirometry today - likely moderate COPD. Samples of advair 500/50 twice daily provided today for next month. Then fill either advair or symbicort (whichever is cheaper with insurance, scripts for both provided today) Return to see me in 3-4 months for follow up

## 2013-07-11 ENCOUNTER — Telehealth: Payer: Self-pay | Admitting: Family Medicine

## 2013-07-11 NOTE — Telephone Encounter (Signed)
Relevant patient education assigned to patient using Emmi. ° °

## 2013-07-31 ENCOUNTER — Ambulatory Visit (INDEPENDENT_AMBULATORY_CARE_PROVIDER_SITE_OTHER): Payer: Managed Care, Other (non HMO) | Admitting: Internal Medicine

## 2013-07-31 ENCOUNTER — Encounter: Payer: Self-pay | Admitting: Internal Medicine

## 2013-07-31 VITALS — BP 132/84 | HR 70 | Temp 97.9°F | Wt 270.0 lb

## 2013-07-31 DIAGNOSIS — J019 Acute sinusitis, unspecified: Secondary | ICD-10-CM

## 2013-07-31 DIAGNOSIS — I889 Nonspecific lymphadenitis, unspecified: Secondary | ICD-10-CM

## 2013-07-31 MED ORDER — AMOXICILLIN-POT CLAVULANATE 875-125 MG PO TABS: 1.0000 | ORAL_TABLET | Freq: Two times a day (BID) | ORAL | Status: DC

## 2013-07-31 NOTE — Progress Notes (Signed)
Subjective:    Patient ID: Brenda [name], female    DOB: [birth year], 53 y.o.   MRN: [id number]  HPI  Pt presents to the clinic today to follow up UC. She reports she was seen 2 days ago and diagnosed with a URI/sinusitis. She was given an zpack but has not started it yet because the pharmacy was out of it. She is supposed to pick it up today. She does have a history of asthma and chronic bronchitis. She is on Advair and albuterol. They did note a swollen lymph node under her right jaw bone and was advised to follow up with her PCP. She reports the area is very swollen and tender to touch.  Review of Systems      Past Medical History  Diagnosis Date  . Bronchitis, chronic obstructive 2008 & 2009    FeV1 64% TLC 105% DLCO 55% 2008  -  FeV1 81% FeF 25-75 43% 2009  . Nonspecific abnormal electrocardiogram (ECG) (EKG)   . Leukocytosis, unspecified   . HTN (hypertension)   . Hx of migraines   . Bipolar I disorder, most recent episode (or current) manic, unspecified   . Pure hyperglyceridemia   . Acute pancreatitis   . COPD (chronic obstructive pulmonary disease)     emphysema  . Nicotine addiction   . Asthma   . Obesity   . Swelling of limb   . Knee pain, right   . Suicide attempt 06/2001  . History of pyelonephritis 05/2012  . Lumbar disc disease with radiculopathy     multilevel spondylitic changes, scoliosis, and anterolisthesis L4 not thought to currently be surg candidate (Dr. Phoebe Perch, [facility]) (Dr. Ollen Bowl, [place])  . Urge and stress incontinence 07/2012    ([facility])    Current Outpatient Prescriptions  Medication Sig Dispense Refill  . albuterol (PROVENTIL HFA;VENTOLIN HFA) 108 (90 BASE) MCG/ACT inhaler Inhale 2 puffs into the lungs every 6 (six) hours as needed for wheezing.      Marland Kitchen albuterol (PROVENTIL) (2.5 MG/3ML) 0.083% nebulizer solution Take 3 mLs (2.5 mg total) by nebulization every 6 (six) hours as needed for wheezing or shortness of breath.  150 mL  1  .  DULoxetine (CYMBALTA) 60 MG capsule Take 60 mg by mouth daily.      . Fluticasone-Salmeterol (ADVAIR DISKUS) 250-50 MCG/DOSE AEPB Inhale 1 puff into the lungs 2 (two) times daily.  60 each  11  . gabapentin (NEURONTIN) 600 MG tablet Take 1,200 mg by mouth 2 (two) times daily.       Marland Kitchen lisinopril (PRINIVIL,ZESTRIL) 2.5 MG tablet Take 2.5 mg by mouth daily.      Marland Kitchen topiramate (TOPAMAX) 100 MG tablet Take 100 mg by mouth 2 (two) times daily.      . [DISCONTINUED] hydrochlorothiazide (HYDRODIURIL) 25 MG tablet Take 25 mg by mouth daily.        . [DISCONTINUED] levalbuterol (XOPENEX) 0.63 MG/3ML nebulizer solution Take 1 ampule by nebulization every 6 (six) hours as needed.         No current facility-administered medications for this visit.    Allergies  Allergen Reactions  . Metolazone     REACTION: metabolic mood swings    Family History  Problem Relation Age of Onset  . Hypertension Mother   . Hyperlipidemia Mother   . Hypertension Father   . Heart disease Brother     MI in early 40's    History   Social History  . Marital Status: Married  Spouse Name: N/A    Number of Children: 3  . Years of Education: N/A   Occupational History  .     Social History Main Topics  . Smoking status: Former Smoker -- 0.50 packs/day for 30 years    Types: Cigarettes    Quit date: 01/30/2013  . Smokeless tobacco: Never Used  . Alcohol Use: Yes     Comment: occasional   . Drug Use: No  . Sexual Activity: Not on file   Other Topics Concern  . Not on file   Social History Narrative  . No narrative on file     Constitutional: Pt reports headache, fever. Denies malaise, fatigue, or abrupt weight changes.  HEENT: Pt reports nasal congestion and sore throat. Denies eye pain, eye redness, ear pain, ringing in the ears, wax buildup, runny nose,  bloody nose. Respiratory: Denies difficulty breathing, shortness of breath, cough or sputum production.   Cardiovascular: Denies chest pain, chest  tightness, palpitations or swelling in the hands or feet.    No other specific complaints in a complete review of systems (except as listed in HPI above).  Objective:   Physical Exam   BP 132/84  Pulse 70  Temp(Src) 97.9 F (36.6 C) (Oral)  Wt 270 lb (122.471 kg)  SpO2 98%  LMP 03/08/2013 Wt Readings from Last 3 Encounters:  07/31/13 270 lb (122.471 kg)  07/10/13 269 lb (122.018 kg)  06/08/13 267 lb (121.11 kg)    General: Appears her stated age, obese but well developed, well nourished in NAD. HEENT: Head: normal shape and size; Eyes: sclera white, no icterus, conjunctiva pink, PERRLA and EOMs intact; Ears: Tm's gray and intact, normal light reflex; Nose: mucosa pink and moist, septum midline; Throat/Mouth: Teeth present, mucosa pink and moist, no exudate, lesions or ulcerations noted.  Neck: Normal range of motion. Neck supple, trachea midline. Large submandibular lypmh node noted on the right side. No thyromegaly present.  Cardiovascular: Normal rate and rhythm. S1,S2 noted.  No murmur, rubs or gallops noted. No JVD or BLE edema. No carotid bruits noted. Pulmonary/Chest: Normal effort and positive vesicular breath sounds. No respiratory distress. No wheezes, rales or ronchi noted.    BMET    Component Value Date/Time   NA 139 05/31/2013 0035   K 4.4 05/31/2013 0035   CL 103 05/31/2013 0035   CO2 22 05/31/2013 0035   GLUCOSE 136* 05/31/2013 0035   BUN 16 05/31/2013 0035   CREATININE 0.95 05/31/2013 0035   CALCIUM 8.5 05/31/2013 0035   GFRNONAA 67* 05/31/2013 0035   GFRAA 77* 05/31/2013 0035    Lipid Panel  No results found for this basename: chol, trig, hdl, cholhdl, vldl, ldlcalc    CBC    Component Value Date/Time   WBC 8.1 05/30/2013 1200   RBC 4.54 05/30/2013 1200   HGB 13.9 05/30/2013 1200   HCT 41.8 05/30/2013 1200   PLT 276 05/30/2013 1200   MCV 92.1 05/30/2013 1200   MCH 30.6 05/30/2013 1200   MCHC 33.3 05/30/2013 1200   RDW 13.7 05/30/2013 1200    LYMPHSABS 2.8 02/24/2013 1140   MONOABS 0.8 02/24/2013 1140   EOSABS 0.4 02/24/2013 1140   BASOSABS 0.0 02/24/2013 1140    Hgb A1C No results found for this basename: HGBA1C        Assessment & Plan:   Acute sinusitis:  Will change abx to Augmentin BID as she has not picked up her zpack anyway Get some rest and drink plenty of  fluids. Ibuprofen for pain fever Will reevaluate submandibular lymphadenopathy after you finish your abx  RTC as needed or if lymph node enlargement persist or worsens

## 2013-07-31 NOTE — Patient Instructions (Addendum)
Lymphadenopathy °Lymphadenopathy means "disease of the lymph glands." But the term is usually used to describe swollen or enlarged lymph glands, also called lymph nodes. These are the bean-shaped organs found in many locations including the neck, underarm, and groin. Lymph glands are part of the immune system, which fights infections in your body. Lymphadenopathy can occur in just one area of the body, such as the neck, or it can be generalized, with lymph node enlargement in several areas. The nodes found in the neck are the most common sites of lymphadenopathy. °CAUSES  °When your immune system responds to germs (such as viruses or bacteria ), infection-fighting cells and fluid build up. This causes the glands to grow in size. This is usually not something to worry about. Sometimes, the glands themselves can become infected and inflamed. This is called lymphadenitis. °Enlarged lymph nodes can be caused by many diseases: °· Bacterial disease, such as strep throat or a skin infection. °· Viral disease, such as a common cold. °· Other germs, such as lyme disease, tuberculosis, or sexually transmitted diseases. °· Cancers, such as lymphoma (cancer of the lymphatic system) or leukemia (cancer of the white blood cells). °· Inflammatory diseases such as lupus or rheumatoid arthritis. °· Reactions to medications. °Many of the diseases above are rare, but important. This is why you should see your caregiver if you have lymphadenopathy. °SYMPTOMS  °· Swollen, enlarged lumps in the neck, back of the head or other locations. °· Tenderness. °· Warmth or redness of the skin over the lymph nodes. °· Fever. °DIAGNOSIS  °Enlarged lymph nodes are often near the source of infection. They can help healthcare providers diagnose your illness. For instance:  °· Swollen lymph nodes around the jaw might be caused by an infection in the mouth. °· Enlarged glands in the neck often signal a throat infection. °· Lymph nodes that are swollen  in more than one area often indicate an illness caused by a virus. °Your caregiver most likely will know what is causing your lymphadenopathy after listening to your history and examining you. Blood tests, x-rays or other tests may be needed. If the cause of the enlarged lymph node cannot be found, and it does not go away by itself, then a biopsy may be needed. Your caregiver will discuss this with you. °TREATMENT  °Treatment for your enlarged lymph nodes will depend on the cause. Many times the nodes will shrink to normal size by themselves, with no treatment. Antibiotics or other medicines may be needed for infection. Only take over-the-counter or prescription medicines for pain, discomfort or fever as directed by your caregiver. °HOME CARE INSTRUCTIONS  °Swollen lymph glands usually return to normal when the underlying medical condition goes away. If they persist, contact your health-care provider. He/she might prescribe antibiotics or other treatments, depending on the diagnosis. Take any medications exactly as prescribed. Keep any follow-up appointments made to check on the condition of your enlarged nodes.  °SEEK MEDICAL CARE IF:  °· Swelling lasts for more than two weeks. °· You have symptoms such as weight loss, night sweats, fatigue or fever that does not go away. °· The lymph nodes are hard, seem fixed to the skin or are growing rapidly. °· Skin over the lymph nodes is red and inflamed. This could mean there is an infection. °SEEK IMMEDIATE MEDICAL CARE IF:  °· Fluid starts leaking from the area of the enlarged lymph node. °· You develop a fever of 102° F (38.9° C) or greater. °· Severe   pain develops (not necessarily at the site of a large lymph node). °· You develop chest pain or shortness of breath. °· You develop worsening abdominal pain. °MAKE SURE YOU:  °· Understand these instructions. °· Will watch your condition. °· Will get help right away if you are not doing well or get worse. °Document  Released: 02/25/2008 Document Revised: 08/10/2011 Document Reviewed: 02/25/2008 °[employer]® Patient Information ©2014 [employer]. ° °

## 2013-07-31 NOTE — Progress Notes (Signed)
Pre visit review using our clinic review tool, if applicable. No additional management support is needed unless otherwise documented below in the visit note. 

## 2013-08-21 ENCOUNTER — Encounter: Payer: Self-pay | Admitting: Family Medicine

## 2013-08-21 ENCOUNTER — Encounter: Payer: Self-pay | Admitting: *Deleted

## 2013-08-21 ENCOUNTER — Ambulatory Visit (INDEPENDENT_AMBULATORY_CARE_PROVIDER_SITE_OTHER): Payer: 59 | Admitting: Family Medicine

## 2013-08-21 ENCOUNTER — Ambulatory Visit
Admission: RE | Admit: 2013-08-21 | Discharge: 2013-08-21 | Disposition: A | Discharge status: Home / Self Care | Payer: 59 | Source: Ambulatory Visit | Attending: Family Medicine | Admitting: Family Medicine

## 2013-08-21 VITALS — BP 132/80 | HR 87 | Temp 98.0°F | Wt 283.5 lb

## 2013-08-21 DIAGNOSIS — I10 Essential (primary) hypertension: Secondary | ICD-10-CM

## 2013-08-21 DIAGNOSIS — J441 Chronic obstructive pulmonary disease with (acute) exacerbation: Secondary | ICD-10-CM

## 2013-08-21 DIAGNOSIS — R590 Localized enlarged lymph nodes: Secondary | ICD-10-CM

## 2013-08-21 DIAGNOSIS — R062 Wheezing: Secondary | ICD-10-CM

## 2013-08-21 DIAGNOSIS — R599 Enlarged lymph nodes, unspecified: Secondary | ICD-10-CM

## 2013-08-21 DIAGNOSIS — J449 Chronic obstructive pulmonary disease, unspecified: Secondary | ICD-10-CM

## 2013-08-21 MED ORDER — DOXYCYCLINE HYCLATE 100 MG PO CAPS: 100.0000 mg | ORAL_CAPSULE | Freq: Two times a day (BID) | ORAL | Status: DC

## 2013-08-21 MED ORDER — IPRATROPIUM BROMIDE 0.02 % IN SOLN
0.5000 mg | Freq: Once | RESPIRATORY_TRACT | Status: AC
  Administered 2013-08-21: 0.5 mg via RESPIRATORY_TRACT

## 2013-08-21 MED ORDER — ALBUTEROL SULFATE (2.5 MG/3ML) 0.083% IN NEBU: 2.5000 mg | INHALATION_SOLUTION | Freq: Four times a day (QID) | RESPIRATORY_TRACT | Status: DC | PRN

## 2013-08-21 MED ORDER — LOSARTAN POTASSIUM 25 MG PO TABS: 25.0000 mg | ORAL_TABLET | Freq: Every day | ORAL | Status: DC

## 2013-08-21 MED ORDER — TIOTROPIUM BROMIDE MONOHYDRATE 18 MCG IN CAPS: 18.0000 ug | ORAL_CAPSULE | Freq: Every day | RESPIRATORY_TRACT | Status: DC

## 2013-08-21 MED ORDER — PREDNISONE 20 MG PO TABS: ORAL_TABLET | ORAL | Status: DC

## 2013-08-21 MED ORDER — ALBUTEROL SULFATE (2.5 MG/3ML) 0.083% IN NEBU
2.5000 mg | INHALATION_SOLUTION | Freq: Once | RESPIRATORY_TRACT | Status: AC
  Administered 2013-08-21: 2.5 mg via RESPIRATORY_TRACT

## 2013-08-21 MED ORDER — OMEPRAZOLE 20 MG PO CPDR: 20.0000 mg | DELAYED_RELEASE_CAPSULE | Freq: Every day | ORAL | Status: DC

## 2013-08-21 NOTE — Patient Instructions (Addendum)
Pass by Brenda [name]'s office to schedule ultrasound of neck in next 1-2 weeks. Stop lisinopril, start losartan 25mg  daily for blood pressure. Start prednisone and doxycycline courses for COPD exacerbation. For possible reflux component, start omeprazole 20mg  over the counter daily. 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 If we have another flare of breathing difficulty, I recommend returning to see pulmonary If any worsening trouble breathing or wheezing please seek urgent care.

## 2013-08-21 NOTE — Assessment & Plan Note (Addendum)
Will transition from ACEI to ARB today - ?lisinopril exacerbating respiratory issues/throat scratching/tightness. Add on spiriva to her advair for triple therapy.  Refilled albuterol neb.

## 2013-08-21 NOTE — Progress Notes (Signed)
Pre visit review using our clinic review tool, if applicable. No additional management support is needed unless otherwise documented below in the visit note. 

## 2013-08-21 NOTE — Progress Notes (Signed)
BP 132/80  Pulse 87  Temp(Src) 98 F (36.7 C) (Oral)  Wt 283 lb 8 oz (128.595 kg)  SpO2 98%  LMP 03/08/2013   CC: wheezing  Subjective:    Patient ID: Brenda [name], female    DOB: 25-Oct-1960, 53 y.o.   MRN: [id number]  HPI: Brenda [name] is a 53 y.o. female presenting on 08/21/2013 for Wheezing   Seen 3 weeks ago by Rene Kocheregina with dx sinusitis, treated with augmentin course.  Now over the last 2 wks noticing increased dyspnea, cough ,wheeze, fatigue, productive cough.  Difficulty walking from parking lot to work, difficulty catching breath after minimal exertion.  Acutely worse last few days.  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  Worried about job. Brenda [name] also endorses noticing increased GERD sxs of reflux and water brash over last few days.   She also has noticed increased allergic rhinitis symptoms but is unsure if this is from residual head congestion or allergies.  Mild h/o allergies in the past. Quit smoking 01/2013.  Also has noticed persistent nodule under right neck present for last 3 weeks, tender to palpation.  Spirometry last month 07/2013 - moderately severe obstruction with some restriction (low FVC).  Pre: FVC 66%, FEV1 58%, ratio 0.7. Post: FVC 68%, FEV1 64%, ratio 0.76  Compliant with advair 250/50 bid, and needing albuterol tid nebulizer regularly   Relevant past medical, surgical, family and social history reviewed and updated as indicated.  Allergies and medications reviewed and updated. Current Outpatient Prescriptions on File Prior to Visit  Medication Sig  . albuterol (PROVENTIL HFA;VENTOLIN HFA) 108 (90 BASE) MCG/ACT inhaler Inhale 2 puffs into the lungs every 6 (six) hours as needed for wheezing.  . DULoxetine (CYMBALTA) 60 MG capsule Take 60 mg by mouth daily.  .  Fluticasone-Salmeterol (ADVAIR DISKUS) 250-50 MCG/DOSE AEPB Inhale 1 puff into the lungs 2 (two) times daily.  Marland Kitchen. gabapentin (NEURONTIN) 600 MG tablet Take 1,200 mg by mouth 2 (two) times daily.   Marland Kitchen. topiramate (TOPAMAX) 100 MG tablet Take 100 mg by mouth 2 (two) times daily.  . [DISCONTINUED] hydrochlorothiazide (HYDRODIURIL) 25 MG tablet Take 25 mg by mouth daily.    . [DISCONTINUED] levalbuterol (XOPENEX) 0.63 MG/3ML nebulizer solution Take 1 ampule by nebulization every 6 (six) hours as needed.     No current facility-administered medications on file prior to visit.    Review of Systems Per HPI unless specifically indicated above    Objective:    BP 132/80  Pulse 87  Temp(Src) 98 F (36.7 C) (Oral)  Wt 283 lb 8 oz (128.595 kg)  SpO2 98%  LMP 03/08/2013  Physical Exam  Nursing note and vitals reviewed. Constitutional: She appears well-developed and well-nourished. No distress.  HENT:  Head: Normocephalic and atraumatic.  Right Ear: Hearing, external ear and ear canal normal.  Left Ear: Hearing, external ear and ear canal normal.  Nose: Mucosal edema present. No rhinorrhea. Right sinus exhibits maxillary sinus tenderness. Right sinus exhibits no frontal sinus tenderness. Left sinus exhibits maxillary sinus tenderness. Left sinus exhibits no frontal sinus tenderness.  Mouth/Throat: Uvula is midline, oropharynx is clear and moist and mucous membranes are normal. No oropharyngeal exudate, posterior oropharyngeal edema, posterior oropharyngeal erythema or tonsillar abscesses.  TMs congested bilaterally  Eyes: Conjunctivae and EOM are normal. Pupils are equal, round, and  reactive to light. No scleral icterus.  Neck: Normal range of motion. Neck supple.  Cardiovascular: Normal rate, regular rhythm, normal heart sounds and intact distal pulses.   No murmur heard. Pulmonary/Chest: Accessory muscle usage present. She is in respiratory distress (abdominal breathing, prolonged expiration  phase, unable to speak in complete sentences). She has decreased breath sounds. She has no wheezes (diffuse). She has no rhonchi. She has no rales.  Musculoskeletal: She exhibits no edema.  Lymphadenopathy:       Head (right side): Submandibular and tonsillar adenopathy present. No submental, no preauricular and no posterior auricular adenopathy present.       Head (left side): No submental, no submandibular, no tonsillar, no preauricular and no posterior auricular adenopathy present.    She has no cervical adenopathy.  Skin: Skin is warm and dry. No rash noted.   After albuterol/atrovent neb x2, improved air movement and able to speak in complete sentences, decreased pulmonary wheeze, persistent upper air way transmission.    Assessment & Plan:   Problem List Items Addressed This Visit   BRONCHITIS, OBSTRUCTIVE CHRONIC     Will transition from ACEI to ARB today - ?lisinopril exacerbating respiratory issues/throat scratching/tightness. Add on spiriva to her advair for triple therapy.  Refilled albuterol neb.    Relevant Medications      ipratropium (ATROVENT) nebulizer solution 0.5 mg (Completed)      albuterol (PROVENTIL) (2.5 MG/3ML) 0.083% nebulizer solution 2.5 mg (Completed)      predniSONE (DELTASONE) tablet      SPIRIVA HANDIHALER 18 MCG IN CAPS      albuterol (PROVENTIL) (2.5 MG/3ML) 0.083% nebulizer solution   COPD exacerbation - Primary     Severe with difficulty breathing on presentation to office today. Recent hospitalization 05/2013 for COPD exacerbation/influenza. S/p 2 albuterol/atrovent nebs in office toady with improvement in wheezing, air movement, and dyspnea. Will provide with another prednisone taper and increase treatment regimen after completes prednisone course to triple therapy advair + spiriva. If persistent exacerbations, will refer to pulm.  Pt agrees with plan.    HYPERTENSION, BENIGN ESSENTIAL     Transition off lisinopril - start losartan 25mg  daily.     Relevant Medications      losartan (COZAAR) tablet   Submandibular lymphadenopathy     Present for last 3 weeks, firm nonmobile nodule.   In h/o smoker (quit 01/2013), will obtain neck US to further evaluate this.    Relevant Orders      US Soft Tissue Head/Neck    Other Visit Diagnoses   Wheezing            Follow up plan: Return if symptoms worsen or fail to improve.

## 2013-08-21 NOTE — Assessment & Plan Note (Signed)
Present for last 3 weeks, firm nonmobile nodule.   In h/o smoker (quit 01/2013), will obtain neck US to further evaluate this.

## 2013-08-21 NOTE — Assessment & Plan Note (Signed)
Transition off lisinopril - start losartan 25mg  daily.

## 2013-08-21 NOTE — Assessment & Plan Note (Signed)
Severe with difficulty breathing on presentation to office today. Recent hospitalization 05/2013 for COPD exacerbation/influenza. S/p 2 albuterol/atrovent nebs in office toady with improvement in wheezing, air movement, and dyspnea. Will provide with another prednisone taper and increase treatment regimen after completes prednisone course to triple therapy advair + spiriva. If persistent exacerbations, will refer to pulm.  Pt agrees with plan.

## 2013-09-13 ENCOUNTER — Telehealth: Payer: Self-pay

## 2013-09-13 MED ORDER — NYSTATIN 100000 UNIT/ML MT SUSP: 5.0000 mL | Freq: Four times a day (QID) | OROMUCOSAL | Status: DC

## 2013-09-13 NOTE — Telephone Encounter (Signed)
Pt left v/m; inside of mouth is sore; beginning to get difficult to eat and drink due to soreness in mouth, pt thinks has thrush in mouth due to Advair and Spiriva inhaler usage; pt is willing to be seen today if needed or request med sent to [facility]. Pt request cb.

## 2013-09-13 NOTE — Telephone Encounter (Signed)
Message left notifying patient.

## 2013-09-13 NOTE — Telephone Encounter (Signed)
Nystatin sent in.  Time advair prior to brushing teeth.  Update if not improving

## 2013-09-18 ENCOUNTER — Encounter: Payer: Self-pay | Admitting: Internal Medicine

## 2013-09-18 ENCOUNTER — Ambulatory Visit (INDEPENDENT_AMBULATORY_CARE_PROVIDER_SITE_OTHER)
Admission: RE | Admit: 2013-09-18 | Discharge: 2013-09-18 | Disposition: A | Discharge status: Home / Self Care | Payer: Managed Care, Other (non HMO) | Source: Ambulatory Visit | Attending: Internal Medicine | Admitting: Internal Medicine

## 2013-09-18 ENCOUNTER — Ambulatory Visit (INDEPENDENT_AMBULATORY_CARE_PROVIDER_SITE_OTHER): Payer: Managed Care, Other (non HMO) | Admitting: Internal Medicine

## 2013-09-18 VITALS — BP 136/84 | HR 83 | Temp 98.0°F | Wt 289.2 lb

## 2013-09-18 DIAGNOSIS — J449 Chronic obstructive pulmonary disease, unspecified: Secondary | ICD-10-CM

## 2013-09-18 DIAGNOSIS — J441 Chronic obstructive pulmonary disease with (acute) exacerbation: Secondary | ICD-10-CM

## 2013-09-18 DIAGNOSIS — R609 Edema, unspecified: Secondary | ICD-10-CM

## 2013-09-18 DIAGNOSIS — R0602 Shortness of breath: Secondary | ICD-10-CM

## 2013-09-18 MED ORDER — ALBUTEROL SULFATE (2.5 MG/3ML) 0.083% IN NEBU: 2.5000 mg | INHALATION_SOLUTION | Freq: Four times a day (QID) | RESPIRATORY_TRACT | Status: DC | PRN

## 2013-09-18 MED ORDER — HYDROCHLOROTHIAZIDE 25 MG PO TABS: 25.0000 mg | ORAL_TABLET | Freq: Every day | ORAL | Status: DC

## 2013-09-18 MED ORDER — PREDNISONE 10 MG PO TABS: ORAL_TABLET | ORAL | Status: DC

## 2013-09-18 NOTE — Progress Notes (Signed)
Subjective:    Patient ID: Brenda [name], female    DOB: 1960/08/22, 53 y.o.   MRN: [id number]  HPI  Pt presents to the clinic today with c/o worsening shortness of breath, wheezing. This has been getting worse over the last 4 months. She has been using her inhalers and her nebulizer with only short term relief. She has seen a pulmonologist in the past but it has been many years. She also notes swelling in her legs and weight gain. She is still currently smoking.  Review of Systems      Past Medical History  Diagnosis Date  . Bronchitis, chronic obstructive 2008 & 2009    FeV1 64% TLC 105% DLCO 55% 2008  -  FeV1 81% FeF 25-75 43% 2009  . Nonspecific abnormal electrocardiogram (ECG) (EKG)   . Leukocytosis, unspecified   . HTN (hypertension)   . Hx of migraines   . Bipolar I disorder, most recent episode (or current) manic, unspecified   . Pure hyperglyceridemia   . Acute pancreatitis   . COPD (chronic obstructive pulmonary disease)     emphysema  . Nicotine addiction   . Asthma   . Obesity   . Swelling of limb   . Knee pain, right   . Suicide attempt 06/2001  . History of pyelonephritis 05/2012  . Lumbar disc disease with radiculopathy     multilevel spondylitic changes, scoliosis, and anterolisthesis L4 not thought to currently be surg candidate (Dr. Phoebe PerchHirsch, [facility]) (Dr. Ollen [facility]arkins, [facility])  . Urge and stress incontinence 07/2012    ([facility])    Current Outpatient Prescriptions  Medication Sig Dispense Refill  . albuterol (PROVENTIL HFA;VENTOLIN HFA) 108 (90 BASE) MCG/ACT inhaler Inhale 2 puffs into the lungs every 6 (six) hours as needed for wheezing.      Marland Kitchen. albuterol (PROVENTIL) (2.5 MG/3ML) 0.083% nebulizer solution Take 3 mLs (2.5 mg total) by nebulization every 6 (six) hours as needed for wheezing or shortness of breath.  150 mL  3  . doxycycline (VIBRAMYCIN) 100 MG capsule Take 1 capsule (100 mg total) by mouth 2 (two) times daily.  20 capsule  0  . DULoxetine  (CYMBALTA) 60 MG capsule Take 60 mg by mouth daily.      . Fluticasone-Salmeterol (ADVAIR DISKUS) 250-50 MCG/DOSE AEPB Inhale 1 puff into the lungs 2 (two) times daily.  60 each  11  . gabapentin (NEURONTIN) 600 MG tablet Take 1,200 mg by mouth 2 (two) times daily.       Marland Kitchen. losartan (COZAAR) 25 MG tablet Take 1 tablet (25 mg total) by mouth daily.  30 tablet  6  . nystatin (MYCOSTATIN) 100000 UNIT/ML suspension Take 5 mLs (500,000 Units total) by mouth 4 (four) times daily. For 5 days  180 mL  0  . omeprazole (PRILOSEC) 20 MG capsule Take 1 capsule (20 mg total) by mouth daily.  30 capsule  3  . predniSONE (DELTASONE) 20 MG tablet Take 3 tablets daily for 3 days followed by 2 tablets daily for 3 days followed by one tablet daily for 5 days  20 tablet  0  . tiotropium (SPIRIVA HANDIHALER) 18 MCG inhalation capsule Place 1 capsule (18 mcg total) into inhaler and inhale at bedtime.  30 capsule  12  . topiramate (TOPAMAX) 100 MG tablet Take 100 mg by mouth 2 (two) times daily.      . [DISCONTINUED] hydrochlorothiazide (HYDRODIURIL) 25 MG tablet Take 25 mg by mouth daily.        . [  DISCONTINUED] levalbuterol (XOPENEX) 0.63 MG/3ML nebulizer solution Take 1 ampule by nebulization every 6 (six) hours as needed.         No current facility-administered medications for this visit.    Allergies  Allergen Reactions  . Metolazone     REACTION: metabolic mood swings    Family History  Problem Relation Age of Onset  . Hypertension Mother   . Hyperlipidemia Mother   . Hypertension Father   . Heart disease Brother     MI in early 42's    History   Social History  . Marital Status: Married    Spouse Name: N/A    Number of Children: 3  . Years of Education: N/A   Occupational History  .     Social History Main Topics  . Smoking status: Former Smoker -- 0.50 packs/day for 30 years    Types: Cigarettes    Quit date: 01/30/2013  . Smokeless tobacco: Never Used  . Alcohol Use: Yes     Comment:  occasional   . Drug Use: No  . Sexual Activity: Not on file   Other Topics Concern  . Not on file   Social History Narrative  . No narrative on file     Constitutional: Denies fever, malaise, fatigue, headache or abrupt weight changes.  Respiratory: Pt reports shortness of breath. Denies difficulty breathing, cough or sputum production.   Cardiovascular: Pt reports swelling in the feet. Denies chest pain, chest tightness, palpitations or swelling in the hands.   No other specific complaints in a complete review of systems (except as listed in HPI above).  Objective:   Physical Exam   BP 136/84  Pulse 83  Temp(Src) 98 F (36.7 C) (Oral)  Wt 289 lb 4 oz (131.203 kg)  SpO2 98%  LMP 03/08/2013 Wt Readings from Last 3 Encounters:  09/18/13 289 lb 4 oz (131.203 kg)  08/21/13 283 lb 8 oz (128.595 kg)  07/31/13 270 lb (122.471 kg)    General: Appears her stated age, obese in NAD. Cardiovascular: Normal rate and rhythm. S1,S2 noted.  No murmur, rubs or gallops noted. No JVD. 2 + pitting bilateral lower extremity edema. No carotid bruits noted. Pulmonary/Chest: Increased effort and bilateral expiratory wheeze noted. No respiratory distress.    BMET    Component Value Date/Time   NA 139 05/31/2013 0035   K 4.4 05/31/2013 0035   CL 103 05/31/2013 0035   CO2 22 05/31/2013 0035   GLUCOSE 136* 05/31/2013 0035   BUN 16 05/31/2013 0035   CREATININE 0.95 05/31/2013 0035   CALCIUM 8.5 05/31/2013 0035   GFRNONAA 67* 05/31/2013 0035   GFRAA 77* 05/31/2013 0035    Lipid Panel  No results found for this basename: chol, trig, hdl, cholhdl, vldl, ldlcalc    CBC    Component Value Date/Time   WBC 8.1 05/30/2013 1200   RBC 4.54 05/30/2013 1200   HGB 13.9 05/30/2013 1200   HCT 41.8 05/30/2013 1200   PLT 276 05/30/2013 1200   MCV 92.1 05/30/2013 1200   MCH 30.6 05/30/2013 1200   MCHC 33.3 05/30/2013 1200   RDW 13.7 05/30/2013 1200   LYMPHSABS 2.8 02/24/2013 1140   MONOABS 0.8  02/24/2013 1140   EOSABS 0.4 02/24/2013 1140   BASOSABS 0.0 02/24/2013 1140    Hgb A1C No results found for this basename: HGBA1C        Assessment & Plan:   COPD exacerbation:  4th time in last 4 months Will repeat  chest xray today eRx for pred taper Will refer back to pulmonology Nebulizer medication refilled today  Peripheral edema:  ? Some component of heart failure Will restart her HCTZ, watch for decrease in BP, lightheadedness Will consider workup after you see pulmonologist  Follow up after you see pulmonology or sooner if needed

## 2013-09-18 NOTE — Progress Notes (Signed)
Pre visit review using our clinic review tool, if applicable. No additional management support is needed unless otherwise documented below in the visit note. 

## 2013-09-18 NOTE — Patient Instructions (Addendum)
Chronic Obstructive Pulmonary Disease  Chronic obstructive pulmonary disease (COPD) is a common lung condition in which airflow from the lungs is limited. COPD is a general term that can be used to describe many different lung problems that limit airflow, including both chronic bronchitis and emphysema.  If you have COPD, your lung function will probably never return to normal, but there are measures you can take to improve lung function and make yourself feel better.   CAUSES   · Smoking (common).    · Exposure to secondhand smoke.    · Genetic problems.  · Chronic inflammatory lung diseases or recurrent infections.  SYMPTOMS   · Shortness of breath, especially with physical activity.    · Deep, persistent (chronic) cough with a large amount of thick mucus.    · Wheezing.    · Rapid breaths (tachypnea).    · Gray or bluish discoloration (cyanosis) of the skin, especially in fingers, toes, or lips.    · Fatigue.    · Weight loss.    · Frequent infections or episodes when breathing symptoms become much worse (exacerbations).    · Chest tightness.  DIAGNOSIS   Your healthcare provider will take a medical history and perform a physical examination to make the initial diagnosis.  Additional tests for COPD may include:   · Lung (pulmonary) function tests.  · Chest X-ray.  · CT scan.  · Blood tests.  TREATMENT   Treatment available to help you feel better when you have COPD include:   · Inhaler and nebulizer medicines. These help manage the symptoms of COPD and make your breathing more comfortable  · Supplemental oxygen. Supplemental oxygen is only helpful if you have a low oxygen level in your blood.    · Exercise and physical activity. These are beneficial for nearly all people with COPD. Some people may also benefit from a pulmonary rehabilitation program.  HOME CARE INSTRUCTIONS   · Take all medicines (inhaled or pills) as directed by your health care provider.  · Only take over-the-counter or prescription medicines  for pain, fever, or discomfort as directed by your health care provider.    · Avoid over-the-counter medicines or cough syrups that dry up your airway (such as antihistamines) and slow down the elimination of secretions unless instructed otherwise by your healthcare provider.    · If you are a smoker, the most important thing that you can do is stop smoking. Continuing to smoke will cause further lung damage and breathing trouble. Ask your health care provider for help with quitting smoking. He or she can direct you to community resources or hospitals that provide support.  · Avoid exposure to irritants such as smoke, chemicals, and fumes that aggravate your breathing.  · Use oxygen therapy and pulmonary rehabilitation if directed by your health care provider. If you require home oxygen therapy, ask your healthcare provider whether you should purchase a pulse oximeter to measure your oxygen level at home.    · Avoid contact with individuals who have a contagious illness.  · Avoid extreme temperature and humidity changes.  · Eat healthy foods. Eating smaller, more frequent meals and resting before meals may help you maintain your strength.  · Stay active, but balance activity with periods of rest. Exercise and physical activity will help you maintain your ability to do things you want to do.  · Preventing infection and hospitalization is very important when you have COPD. Make sure to receive all the vaccines your health care provider recommends, especially the pneumococcal and influenza vaccines. Ask your healthcare provider whether you   need a pneumonia vaccine.  · Learn and use relaxation techniques to manage stress.  · Learn and use controlled breathing techniques as directed by your health care provider. Controlled breathing techniques include:    · Pursed lip breathing. Start by breathing in (inhaling) through your nose for 1 second. Then, purse your lips as if you were going to whistle and breathe out (exhale)  through the pursed lips for 2 seconds.    · Diaphragmatic breathing. Start by putting one hand on your abdomen just above your waist. Inhale slowly through your nose. The hand on your abdomen should move out. Then purse your lips and exhale slowly. You should be able to feel the hand on your abdomen moving in as you exhale.    · Learn and use controlled coughing to clear mucus from your lungs. Controlled coughing is a series of short, progressive coughs. The steps of controlled coughing are:    1. Lean your head slightly forward.    2. Breathe in deeply using diaphragmatic breathing.    3. Try to hold your breath for 3 seconds.    4. Keep your mouth slightly open while coughing twice.    5. Spit any mucus out into a tissue.    6. Rest and repeat the steps once or twice as needed.  SEEK MEDICAL CARE IF:   · You are coughing up more mucus than usual.    · There is a change in the color or thickness of your mucus.    · Your breathing is more labored than usual.    · Your breathing is faster than usual.    SEEK IMMEDIATE MEDICAL CARE IF:   · You have shortness of breath while you are resting.    · You have shortness of breath that prevents you from:  · Being able to talk.    · Performing your usual physical activities.    · You have chest pain lasting longer than 5 minutes.    · Your skin color is more cyanotic than usual.  · You measure low oxygen saturations for longer than 5 minutes with a pulse oximeter.  MAKE SURE YOU:   · Understand these instructions.  · Will watch your condition.  · Will get help right away if you are not doing well or get worse.  Document Released: 02/25/2005 Document Revised: 03/08/2013 Document Reviewed: 01/12/2013  Exit[employer]® Patient Information ©2014 [employer].

## 2013-09-22 ENCOUNTER — Ambulatory Visit (INDEPENDENT_AMBULATORY_CARE_PROVIDER_SITE_OTHER): Payer: Managed Care, Other (non HMO) | Admitting: Internal Medicine

## 2013-09-22 ENCOUNTER — Encounter: Payer: Self-pay | Admitting: Internal Medicine

## 2013-09-22 VITALS — BP 140/92 | HR 94 | Temp 98.2°F | Ht 63.0 in | Wt 293.8 lb

## 2013-09-22 DIAGNOSIS — R059 Cough, unspecified: Secondary | ICD-10-CM

## 2013-09-22 DIAGNOSIS — I10 Essential (primary) hypertension: Secondary | ICD-10-CM

## 2013-09-22 DIAGNOSIS — R05 Cough: Secondary | ICD-10-CM

## 2013-09-22 DIAGNOSIS — R058 Other specified cough: Secondary | ICD-10-CM | POA: Insufficient documentation

## 2013-09-22 MED ORDER — FLUTTER DEVI: Status: DC

## 2013-09-22 MED ORDER — FAMOTIDINE 20 MG PO TABS: ORAL_TABLET | ORAL | Status: DC

## 2013-09-22 MED ORDER — VALSARTAN 160 MG PO TABS: 160.0000 mg | ORAL_TABLET | Freq: Every day | ORAL | Status: DC

## 2013-09-22 MED ORDER — LEVALBUTEROL HCL 1.25 MG/3ML IN NEBU: 1.2500 mg | INHALATION_SOLUTION | RESPIRATORY_TRACT | Status: DC | PRN

## 2013-09-22 MED ORDER — TRAMADOL HCL 50 MG PO TABS: ORAL_TABLET | ORAL | Status: DC

## 2013-09-22 NOTE — Assessment & Plan Note (Addendum)
For reasons that may related to vascular permability and nitric oxide pathways but not elevated  bradykinin levels (as seen with  ACEi use) losartan in the generic form has been reported now from mulitple sources  to cause a similar pattern of non-specific  upper airway symptoms as seen with acei.   This has not been reported with exposure to the other ARB's to date, so it seems reasonable for now to try either generic diovan or avapro if ARB needed or use an alternative class altogether.  See:  Dewayne HatchAnn Allergy Asthma Immunol  2008: 101: p 495-499    Try diovan 160 mg one daily instead of losartan trial basis

## 2013-09-22 NOTE — Patient Instructions (Addendum)
Stop advair, cozar and spiriva   xopenex 1.25 mg 4 x daily  Prilosec 20 mg x 2 =  40 mg   Take 30-60 min before first meal of the day and Pepcid 20 mg one bedtime until return to office - this is the best way to tell whether stomach acid is contributing to your problem.   GERD (REFLUX)  is an extremely common cause of respiratory symptoms, many times with no significant heartburn at all.    It can be treated with medication, but also with lifestyle changes including avoidance of late meals, excessive alcohol, smoking cessation, and avoid fatty foods, chocolate, peppermint, colas, red wine, and acidic juices such as orange juice.  NO MINT OR MENTHOL PRODUCTS SO NO COUGH DROPS  USE SUGARLESS CANDY INSTEAD (jolley ranchers or Stover's)  NO OIL BASED VITAMINS - use powdered substitutes.  Diovan (losartan)160 one daily is your new blood pressure pill   For cough > use flutter valve and Take delsym two tsp every 12 hours and supplement if needed with  tramadol 50 mg up to 2 every 4 hours to suppress the urge to cough. Swallowing water or using ice chips/non mint and menthol containing candies (such as lifesavers or sugarless jolly ranchers) are also effective.  You should rest your voice and avoid activities that you know make you cough.  Once you have eliminated the cough for 3 straight days try reducing the tramadol first,  then the delsym as tolerated.    Please schedule a follow up office visit in 2 weeks, sooner if needed

## 2013-09-22 NOTE — Progress Notes (Signed)
   Subjective:    Patient ID: Brenda [name], female    DOB: [birth year]    MRN: [id number]  HPI  [age] yowf quit smoking 01/2013 at wt 250 and admitted with aecopd   Admit date: 05/30/2013  Discharge date: 06/02/2013  HYPERTRIGLYCERIDEMIA  LEUKOCYTOSIS UNSPECIFIED  BIPOLAR AFFECTIVE DISORDER, MANIC  NICOTINE ADDICTION  HYPERTENSION, BENIGN ESSENTIAL  BRONCHITIS, OBSTRUCTIVE CHRONIC  EMPHYSEMA  REACTIVE AIRWAY DISEASE  COPD exacerbation  Chronic pain syndrome  Migraine, unspecified, without mention of intractable migraine without mention of status migrainosus   09/22/2013 1st [facility] office visit/  [place]  Chief Complaint  Patient presents with  . Pulmonary Consult    [facility]. Pt c/o SOB since Dec 2014, but has been worse since Jan 2015. She reports that she gets out of breath walking just a few steps and wakes up out of breath 3-4 times every night. She also c/o cough that she states started "all the time"- non prod.   onset was abrupt but pattern has been progressively worse with minimal transient benefit from prednisone for up to week then back on again and attributes wt gain to steroids x around 50 lbs.  Cough is harsh barking. Sob no better from albterol, says xopenex works better    Review of Systems  Constitutional: Negative for fever, chills and unexpected weight change.  HENT: Positive for congestion and sore throat. Negative for dental problem, ear pain, nosebleeds, postnasal drip, rhinorrhea, sinus pressure, sneezing, trouble swallowing and voice change.   Eyes: Negative for visual disturbance.  Respiratory: Positive for cough and shortness of breath. Negative for choking.   Cardiovascular: Positive for leg swelling. Negative for chest pain.  Gastrointestinal: Positive for abdominal pain. Negative for vomiting and diarrhea.  Genitourinary: Negative for difficulty urinating.  Musculoskeletal: Negative for arthralgias.  Skin: Negative for rash.   Neurological: Negative for tremors, syncope and headaches.  Hematological: Does not bruise/bleed easily.       Objective:   Physical Exam  Hoarse obese wf with very prominent pseudowheeze much better with PLM  Wt Readings from Last 3 Encounters:  09/22/13 293 lb 12.8 oz (133.267 kg)  09/18/13 289 lb 4 oz (131.203 kg)  08/21/13 283 lb 8 oz (128.595 kg)      HEENT: nl dentition, turbinates, and orophanx. Nl external ear canals without cough reflex   NECK :  without JVD/Nodes/TM/ nl carotid upstrokes bilaterally   LUNGS: no acc muscle use, clear to A and P bilaterally without cough on insp or exp maneuvers   CV:  RRR  no s3 or murmur or increase in P2, no edema   ABD:  Massively obese but soft and nontender with nl excursion in the supine position. No bruits or organomegaly, bowel sounds nl  MS:  warm without deformities, calf tenderness, cyanosis or clubbing  SKIN: warm and dry without lesions    NEURO:  Very anxious/ ? Hopeless/  no deficits     09/18/13 cxr No active cardiopulmonary disease.      Assessment & Plan:

## 2013-09-22 NOTE — Assessment & Plan Note (Addendum)
Classic Upper airway cough syndrome, so named because it's frequently impossible to sort out how much is  CR/sinusitis with freq throat clearing (which can be related to primary GERD)   vs  causing  secondary (" extra esophageal")  GERD from wide swings in gastric pressure that occur with throat clearing, often  promoting self use of mint and menthol lozenges that reduce the lower esophageal sphincter tone and exacerbate the problem further in a cyclical fashion.   These are the same pts (now being labeled as having "irritable larynx syndrome" by some cough centers) who not infrequently have a history of having failed to tolerate ace inhibitors (or even now losartan - see HBP),  dry powder inhalers or biphosphonates or report having atypical reflux symptoms that don't respond to standard doses of PPI , and are easily confused as having aecopd or asthma flares by even experienced allergists/ pulmonologists.   For now need to control cyclical cough and secondary gerd and then regroup in 2 weeks In meantime try off all inhalers x neb xopenex to see whether some of her symptoms are due to dpi  See instructions for specific recommendations which were reviewed directly with the patient who was given a copy with highlighter outlining the key components.

## 2013-10-06 ENCOUNTER — Ambulatory Visit (INDEPENDENT_AMBULATORY_CARE_PROVIDER_SITE_OTHER): Payer: Managed Care, Other (non HMO) | Admitting: Internal Medicine

## 2013-10-06 ENCOUNTER — Encounter: Payer: Self-pay | Admitting: Internal Medicine

## 2013-10-06 VITALS — BP 126/74 | HR 77 | Temp 98.7°F | Ht 63.0 in | Wt 288.4 lb

## 2013-10-06 DIAGNOSIS — R05 Cough: Secondary | ICD-10-CM

## 2013-10-06 DIAGNOSIS — R058 Other specified cough: Secondary | ICD-10-CM

## 2013-10-06 DIAGNOSIS — J449 Chronic obstructive pulmonary disease, unspecified: Secondary | ICD-10-CM

## 2013-10-06 DIAGNOSIS — R059 Cough, unspecified: Secondary | ICD-10-CM

## 2013-10-06 MED ORDER — MOMETASONE FURO-FORMOTEROL FUM 100-5 MCG/ACT IN AERO: INHALATION_SPRAY | RESPIRATORY_TRACT | Status: DC

## 2013-10-06 NOTE — Progress Notes (Signed)
Subjective:    Patient ID: Brenda [name], female    DOB: 29-Jul-1960    MRN: [id number]  Brief patient profile:  53 yowf quit smoking 01/2013 at wt 250 and admitted with" aecopd"   Admit date: 05/30/2013  Discharge date: 06/02/2013  HYPERTRIGLYCERIDEMIA  LEUKOCYTOSIS UNSPECIFIED  BIPOLAR AFFECTIVE DISORDER, MANIC  NICOTINE ADDICTION  HYPERTENSION, BENIGN ESSENTIAL  BRONCHITIS, OBSTRUCTIVE CHRONIC  EMPHYSEMA  REACTIVE AIRWAY DISEASE  COPD exacerbation  Chronic pain syndrome  Migraine, unspecified, without mention of intractable migraine without mention of status migrainosus   History of Present Illness  09/22/2013 1st [facility] office visit/ Yuriel Lopezmartinez  Chief Complaint  Patient presents with  . Pulmonary Consult    [facility]. Pt c/o SOB since Dec 2014, but has been worse since Jan 2015. She reports that she gets out of breath walking just a few steps and wakes up out of breath 3-4 times every night. She also c/o cough that she states started "all the time"- non prod.   onset was abrupt but pattern has been progressively worse with minimal transient benefit from prednisone for up to week then back on again and attributes wt gain to steroids x around 50 lbs.  Cough is harsh barking. Sob no better from albterol, says xopenex works better   rec Stop advair, cozar and spiriva  xopenex 1.25 mg 4 x daily Prilosec 20 mg x 2 =  40 mg   Take 30-60 min before first meal of the day and Pepcid 20 mg one bedtime until return to office - this is the best way to tell whether stomach acid is contributing to your problem.  GERD diet  Diovan (losartan)160 one daily is your new blood pressure pill  For cough > use flutter valve and Take delsym two tsp every 12 hours and supplement if needed with  tramadol 50 mg up to 2 every 4 hours to suppress the urge to cough   10/06/2013 f/u ov/Pariss Hommes re: cough > sob  Chief Complaint  Patient presents with  . Follow-up    Pt states that her  breathing is some better- "not wheezing all the time anymore".  Cough is also better, usually only occurs now at night, and is non prod.    stll on xopenix bid and no need for any cough suppression x one week  No obvious day to day or daytime variabilty or assoc  cp or chest tightness, subjective wheeze overt sinus or hb symptoms. No unusual exp hx or h/o childhood pna/ asthma or knowledge of premature birth.  Sleeping ok without nocturnal  or early am exacerbation  of respiratory  c/o's or need for noct saba. Also denies any obvious fluctuation of symptoms with weather or environmental changes or other aggravating or alleviating factors except as outlined above   Current Medications, Allergies, Complete Past Medical History, Past Surgical History, Family History, and Social History were [facility].  ROS  The following are not active complaints unless bolded sore throat, dysphagia, dental problems, itching, sneezing,  nasal congestion or excess/ purulent secretions, ear ache,   fever, chills, sweats, unintended wt loss, pleuritic or exertional cp, hemoptysis,  orthopnea pnd or leg swelling, presyncope, palpitations, heartburn, abdominal pain, anorexia, nausea, vomiting, diarrhea  or change in bowel or urinary habits, change in stools or urine, dysuria,hematuria,  rash, arthralgias, visual complaints, headache, numbness weakness or ataxia or problems with walking or coordination,  change in mood/affect or memory.  Objective:   Physical Exam    obese wf nad/ pseudowheeze resolved  10/06/2013         288  Wt Readings from Last 3 Encounters:  09/22/13 293 lb 12.8 oz (133.267 kg)  09/18/13 289 lb 4 oz (131.203 kg)  08/21/13 283 lb 8 oz (128.595 kg)      HEENT: nl dentition, turbinates, and orophanx. Nl external ear canals without cough reflex   NECK :  without JVD/Nodes/TM/ nl carotid upstrokes bilaterally   LUNGS: no acc muscle  use, clear to A and P bilaterally without cough on insp or exp maneuvers   CV:  RRR  no s3 or murmur or increase in P2, no edema   ABD:  Massively obese but soft and nontender with nl excursion in the supine position. No bruits or organomegaly, bowel sounds nl  MS:  warm without deformities, calf tenderness, cyanosis or clubbing  SKIN: warm and dry without lesions    NEURO:  Very anxious/ ? Hopeless/  no deficits     09/18/13 cxr No active cardiopulmonary disease.      Assessment & Plan:

## 2013-10-06 NOTE — Patient Instructions (Addendum)
Start dulera 100 Take 2 puffs first thing in am and then another 2 puffs about 12 hours later.    Only use your xopenox as a rescue medication to be used if you can't catch your breath by resting or doing a relaxed purse lip breathing pattern.  - The less you use it, the better it will work when you need it. - Ok to use up to every 4 hours if you must but call for immediate appointment if use goes up over your usual need  Continue pepcid 20 mg at bedtime until return - if cough gets worse add prilosec otc 20 mg   30- 60 min before your first and last meals of the day   Please schedule a follow up office visit in 6 weeks, call sooner if needed

## 2013-10-08 NOTE — Assessment & Plan Note (Addendum)
-   stopped all dpi's 09/22/2013 and cozar - 09/22/2013 added flutter valve  Not clear what made the most difference but she's much better off the dpi's and cozar so rec stay off for now and add gerd rx until clearly better then back off once response level is clear     Each maintenance medication was reviewed in detail including most importantly the difference between maintenance and as needed and under what circumstances the prns are to be used.  Please see instructions for details which were reviewed in writing and the patient given a copy.

## 2013-10-08 NOTE — Assessment & Plan Note (Addendum)
Spirometry 07/2013:    Pre: FVC 66%, FEV1 58%, ratio 0.7.  Post: FVC 68%, FEV1 64%, ratio 0.76 - 10/06/2013 p extensive coaching HFA effectiveness =    75% > try dulera 100 Take 2 puffs first thing in am and then another 2 puffs about 12 hours later.    Does not appear to have much copd at all so is better characterized at this point as chronic asthma  The proper method of use, as well as anticipated side effects, of a metered-dose inhaler are discussed and demonstrated to the patient. Improved effectiveness after extensive coaching during this visit to a level of approximately  75% so try dulera 100 2bid and change xopenex purely to prn   See instructions for specific recommendations which were reviewed directly with the patient who was given a copy with highlighter outlining the key components.

## 2013-11-10 ENCOUNTER — Other Ambulatory Visit: Payer: Self-pay | Admitting: Family Medicine

## 2013-11-17 ENCOUNTER — Encounter: Payer: Self-pay | Admitting: Internal Medicine

## 2013-11-17 ENCOUNTER — Ambulatory Visit (INDEPENDENT_AMBULATORY_CARE_PROVIDER_SITE_OTHER): Payer: Managed Care, Other (non HMO) | Admitting: Internal Medicine

## 2013-11-17 VITALS — BP 124/90 | HR 91 | Temp 98.3°F | Ht 63.0 in | Wt 288.2 lb

## 2013-11-17 DIAGNOSIS — J45909 Unspecified asthma, uncomplicated: Secondary | ICD-10-CM

## 2013-11-17 DIAGNOSIS — R058 Other specified cough: Secondary | ICD-10-CM

## 2013-11-17 DIAGNOSIS — R05 Cough: Secondary | ICD-10-CM

## 2013-11-17 DIAGNOSIS — R059 Cough, unspecified: Secondary | ICD-10-CM

## 2013-11-17 MED ORDER — MOMETASONE FURO-FORMOTEROL FUM 100-5 MCG/ACT IN AERO
INHALATION_SPRAY | RESPIRATORY_TRACT | Status: DC
Start: 2013-11-17 — End: 2015-07-20

## 2013-11-17 NOTE — Assessment & Plan Note (Signed)
Spirometry 2/9 2015:    FEV1 1.45 (58%) ratio 71 pre B2 with min response to saba  - 10/06/2013 p extensive coaching HFA effectiveness =    75% > try dulera 100  2bid > improved  As long as stays cool she doesn't need the saba at all in any form and suspect much of her need now is not poorly controlled asthma but obeisty / deconditioning/ restrictive changes poorly tolerant of heat and no need for saba if recovers in A/c.  The proper method of use, as well as anticipated side effects, of a metered-dose inhaler are discussed and demonstrated to the patient. Improved effectiveness after extensive coaching during this visit to a level of approximately  90% so rec continue dulera 100 2bid with judicious use of saba and no need for pulmonary f/u  See instructions for specific recommendations which were reviewed directly with the patient who was given a copy with highlighter outlining the key components.

## 2013-11-17 NOTE — Progress Notes (Signed)
Subjective:   Patient ID: Brenda [name], female    DOB: 1960/10/30    MRN: [id number]   Brief patient profile:  53 yowf quit smoking 01/2013 at wt 250 and admitted with" aecopd" but with pfts nearly nl 2/29/15 p rx    Admit date: 05/30/2013  Discharge date: 06/02/2013  HYPERTRIGLYCERIDEMIA  LEUKOCYTOSIS UNSPECIFIED  BIPOLAR AFFECTIVE DISORDER, MANIC  NICOTINE ADDICTION  HYPERTENSION, BENIGN ESSENTIAL  BRONCHITIS, OBSTRUCTIVE CHRONIC  EMPHYSEMA  REACTIVE AIRWAY DISEASE  COPD exacerbation  Chronic pain syndrome  Migraine, unspecified, without mention of intractable migraine without mention of status migrainosus   History of Present Illness  09/22/2013 1st [facility] office visit/ Shella Lahman  Chief Complaint  Patient presents with  . Pulmonary Consult    [facility]. Pt c/o SOB since Dec 2014, but has been worse since Jan 2015. She reports that she gets out of breath walking just a few steps and wakes up out of breath 3-4 times every night. She also c/o cough that she states started "all the time"- non prod.   onset was abrupt but pattern has been progressively worse with minimal transient benefit from prednisone for up to week then back on again and attributes wt gain to steroids x around 50 lbs.  Cough is harsh barking. Sob no better from albterol, says xopenex works better   rec Stop advair, cozar and spiriva  xopenex 1.25 mg 4 x daily Prilosec 20 mg x 2 =  40 mg   Take 30-60 min before first meal of the day and Pepcid 20 mg one bedtime until return to office - this is the best way to tell whether stomach acid is contributing to your problem.  GERD diet  Diovan (losartan)160 one daily is your new blood pressure pill  For cough > use flutter valve and Take delsym two tsp every 12 hours and supplement if needed with  tramadol 50 mg up to 2 every 4 hours to suppress the urge to cough   10/06/2013 f/u ov/Dorwin Fitzhenry re: cough > sob  Chief Complaint  Patient presents with  .  Follow-up    Pt states that her breathing is some better- "not wheezing all the time anymore".  Cough is also better, usually only occurs now at night, and is non prod.    stll on xopenix bid and no need for any cough suppression x one week rec Start dulera 100 Take 2 puffs first thing in am and then another 2 puffs about 12 hours later.  Only use your xopenox as a rescue medication  Continue pepcid 20 mg at bedtime until return      11/17/2013 f/u ov/Lavoris Sparling re: asthma/ no copd by spirometry criteria Chief Complaint  Patient presents with  . Follow-up    Pt reports that breathing has been doing okay. Some increased issues with hotter weather.    until hot weather no need for ventolin / xopenex neb much less if stays home in A/c    No obvious  Other day to day or daytime variabilty or assoc cough or  cp or chest tightness, subjective wheeze overt sinus or hb symptoms. No unusual exp hx or h/o childhood pna/ asthma or knowledge of premature birth.  Sleeping ok without nocturnal  or early am exacerbation  of respiratory  c/o's or need for noct saba. Also denies any obvious fluctuation of symptoms with weather or environmental changes or other aggravating or alleviating factors except as outlined above   Current  Medications, Allergies, Complete Past Medical History, Past Surgical History, Family History, and Social History were reviewed in [facility] electronic [facility] record.  ROS  The following are not active complaints unless bolded sore throat, dysphagia, dental problems, itching, sneezing,  nasal congestion or excess/ purulent secretions, ear ache,   fever, chills, sweats, unintended wt loss, pleuritic or exertional cp, hemoptysis,  orthopnea pnd or leg swelling, presyncope, palpitations, heartburn, abdominal pain, anorexia, nausea, vomiting, diarrhea  or change in bowel or urinary habits, change in stools or urine, dysuria,hematuria,  rash, arthralgias, visual complaints, headache,  numbness weakness or ataxia or problems with walking or coordination,  change in mood/affect or memory.               Objective:   Physical Exam    obese wf nad   10/06/2013         288 > 11/17/2013  288 Wt Readings from Last 3 Encounters:  09/22/13 293 lb 12.8 oz (133.267 kg)  09/18/13 289 lb 4 oz (131.203 kg)  08/21/13 283 lb 8 oz (128.595 kg)      HEENT: nl dentition, turbinates, and orophanx. Nl external ear canals without cough reflex   NECK :  without JVD/Nodes/TM/ nl carotid upstrokes bilaterally   LUNGS: no acc muscle use, clear to A and P bilaterally without cough on insp or exp maneuvers   CV:  RRR  no s3 or murmur or increase in P2, no edema   ABD:  Massively obese but soft and nontender with nl excursion in the supine position. No bruits or organomegaly, bowel sounds nl  MS:  warm without deformities, calf tenderness, cyanosis or clubbing  SKIN: warm and dry without lesions    NEURO:  Very anxious/ ? Hopeless/  no deficits     09/18/13 cxr No active cardiopulmonary disease.      Assessment & Plan:

## 2013-11-17 NOTE — Patient Instructions (Addendum)
Work on inhaler technique:  relax and gently blow all the way out then take a nice smooth deep breath back in, triggering the inhaler at same time you start breathing in.  Hold for up to 5 seconds if you can.  Rinse and gargle with water when done   If you are satisfied with your treatment plan,  let your doctor know and he/she can either refill your medications or you can return here when your prescription runs out.     If in any way you are not 100% satisfied,  please tell us.  If 100% better, tell your friends!  Pulmonary follow up is as needed  .    

## 2013-11-17 NOTE — Assessment & Plan Note (Signed)
-   stopped all dpi's 09/22/2013 and cozar - 09/22/2013 added flutter valve - 10/06/13 add gerd rx > resolved   Would continue hs h2 and work on diet/ wt loss but avoid nsaids/ cozar  F/u prn

## 2013-11-22 ENCOUNTER — Encounter: Payer: Self-pay | Admitting: Family Medicine

## 2013-12-15 ENCOUNTER — Other Ambulatory Visit: Payer: Self-pay | Admitting: Internal Medicine

## 2013-12-29 ENCOUNTER — Other Ambulatory Visit: Payer: Self-pay | Admitting: Family Medicine

## 2014-08-09 ENCOUNTER — Other Ambulatory Visit: Payer: Self-pay | Admitting: Internal Medicine

## 2014-08-13 ENCOUNTER — Other Ambulatory Visit: Payer: Self-pay | Admitting: Family Medicine

## 2014-08-27 ENCOUNTER — Ambulatory Visit (INDEPENDENT_AMBULATORY_CARE_PROVIDER_SITE_OTHER): Payer: Managed Care, Other (non HMO) | Admitting: Family Medicine

## 2014-08-27 ENCOUNTER — Encounter: Payer: Self-pay | Admitting: Family Medicine

## 2014-08-27 VITALS — BP 120/72 | HR 83 | Temp 97.7°F | Ht 63.0 in | Wt 287.4 lb

## 2014-08-27 DIAGNOSIS — N3000 Acute cystitis without hematuria: Secondary | ICD-10-CM | POA: Diagnosis not present

## 2014-08-27 DIAGNOSIS — R300 Dysuria: Secondary | ICD-10-CM

## 2014-08-27 LAB — POCT URINALYSIS DIPSTICK
Bilirubin, UA: NEGATIVE
Glucose, UA: NEGATIVE
Ketones, UA: NEGATIVE
Nitrite, UA: POSITIVE
Spec Grav, UA: 1.025
Urobilinogen, UA: 2
pH, UA: 6

## 2014-08-27 MED ORDER — SULFAMETHOXAZOLE-TRIMETHOPRIM 800-160 MG PO TABS: 1.0000 | ORAL_TABLET | Freq: Two times a day (BID) | ORAL | Status: DC

## 2014-08-27 NOTE — Progress Notes (Signed)
Dr. Karleen HampshireSpencer T. Madelyne Millikan, MD, CAQ Sports Medicine [facility] [address] [place], [id number] Phone: [phone] Fax: [phone]  08/27/2014  Patient: Brenda [name], MRN: [id number], DOB: Nov 25, 1960, 54 y.o.  Primary Physician:  Eustaquio BoydenJavier Gutierrez, MD  Chief Complaint: Urinary Tract Infection  Subjective:   This 54 y.o. female patient presents with burning, urgency. No vaginal discharge or external irritation.  No STD exposure. No abd pain, no flank pain.  Dysuria.  No back pain.   The PMH, PSH, Social History, Family History, Medications, and allergies [facility], and have been updated if relevant.  GEN:  no fevers, chills. GI: No n/v/d, eating normally Otherwise, ROS is as per the HPI.  Objective:   Blood pressure 120/72, pulse 83, temperature 97.7 F (36.5 C), temperature source Oral, height 5\' 3"  (1.6 m), weight 287 lb 6.4 oz (130.364 kg), SpO2 96 %.  GEN: WDWN, A&Ox4,NAD. Non-toxic HEENT: Atraumatc, normocephalic. CV: RRR, No M/G/R PULM: CTA B, No wheezes, crackles, or rhonchi ABD: S, NT, ND, +BS, no rebound. No CVAT. No suprapubic tenderness. EXT: No c/c/e  Objective Data: Results for orders placed or performed in visit on 08/27/14  POCT urinalysis dipstick  Result Value Ref Range   Color, UA Orange    Clarity, UA clear    Glucose, UA negative    Bilirubin, UA negative    Ketones, UA negative    Spec Grav, UA 1.025    Blood, UA 2+    pH, UA 6.0    Protein, UA +    Urobilinogen, UA 2.0    Nitrite, UA Positive    Leukocytes, UA large (3+)     Assessment and Plan:   Burning with urination - Plan: POCT urinalysis dipstick, Urine culture  Acute cystitis without hematuria [N30.00]  UTI, treat as such  Follow-up: Return if symptoms worsen or fail to improve.  New Prescriptions   SULFAMETHOXAZOLE-TRIMETHOPRIM (BACTRIM DS,SEPTRA DS) 800-160 MG PER TABLET    Take 1 tablet by mouth 2 (two) times daily.    Orders Placed This Encounter  Procedures  . Urine culture  . POCT urinalysis dipstick    Signed,  Karleen HampshireSpencer T. Chaise Passarella, MD   Patient's Medications  New Prescriptions   SULFAMETHOXAZOLE-TRIMETHOPRIM (BACTRIM DS,SEPTRA DS) 800-160 MG PER TABLET    Take 1 tablet by mouth 2 (two) times daily.  Previous Medications   ALBUTEROL (VENTOLIN HFA) 108 (90 BASE) MCG/ACT INHALER    Inhale 2 puffs every 4 hours as needed. **MUST HAVE FOLLOW UP FOR FURTHER REFILLS**   CETIRIZINE (ZYRTEC) 10 MG TABLET    Take 10 mg by mouth daily as needed for allergies.   CYCLOBENZAPRINE (FLEXERIL) 10 MG TABLET    Take 10 mg by mouth 3 (three) times daily as needed for muscle spasms.   HYDROCHLOROTHIAZIDE (HYDRODIURIL) 25 MG TABLET    Take 1 tablet (25 mg total) by mouth daily. NEED TO SCHEDULE ANNUAL VISIT FOR MORE REFILLS   LEVALBUTEROL (XOPENEX) 1.25 MG/3ML NEBULIZER SOLUTION    Take 1.25 mg by nebulization 2 (two) times daily.   MOMETASONE-FORMOTEROL (DULERA) 100-5 MCG/ACT AERO    Take 2 puffs first thing in am and then another 2 puffs about 12 hours later.   NYSTATIN (MYCOSTATIN) 100000 UNIT/ML SUSPENSION    TAKE 5MLS BY MOUTH 4 TIMES DAILY FOR 5 DAYS   RESPIRATORY THERAPY SUPPLIES (FLUTTER) DEVI    Use as directed   VALSARTAN (DIOVAN) 160 MG TABLET  Take 1 tablet (160 mg total) by mouth daily.  Modified Medications   No medications on file  Discontinued Medications   CHLORPHENIRAMINE-HYDROCODONE (TUSSIONEX) 10-8 MG/5ML LQCR    Take 5 mLs by mouth every 12 (twelve) hours as needed for cough.   FAMOTIDINE (PEPCID) 20 MG TABLET    TAKE ONE TABLET BY MOUTH AT BEDTIME   OMEPRAZOLE (PRILOSEC) 20 MG CAPSULE    Take 1 capsule (20 mg total) by mouth daily.   TRAMADOL (ULTRAM) 50 MG TABLET    1-2 every 4 hours as needed for cough or pain

## 2014-08-30 LAB — URINE CULTURE: Colony Count: >=100000

## 2014-09-10 ENCOUNTER — Other Ambulatory Visit: Payer: Self-pay | Admitting: Family Medicine

## 2014-09-10 DIAGNOSIS — E781 Pure hyperglyceridemia: Secondary | ICD-10-CM

## 2014-09-10 DIAGNOSIS — E669 Obesity, unspecified: Secondary | ICD-10-CM

## 2014-09-10 DIAGNOSIS — I10 Essential (primary) hypertension: Secondary | ICD-10-CM

## 2014-09-11 ENCOUNTER — Other Ambulatory Visit (INDEPENDENT_AMBULATORY_CARE_PROVIDER_SITE_OTHER): Payer: Managed Care, Other (non HMO)

## 2014-09-11 DIAGNOSIS — E669 Obesity, unspecified: Secondary | ICD-10-CM | POA: Diagnosis not present

## 2014-09-11 DIAGNOSIS — I10 Essential (primary) hypertension: Secondary | ICD-10-CM | POA: Diagnosis not present

## 2014-09-11 DIAGNOSIS — E781 Pure hyperglyceridemia: Secondary | ICD-10-CM | POA: Diagnosis not present

## 2014-09-11 LAB — COMPREHENSIVE METABOLIC PANEL
ALT: 17 U/L (ref 0–35)
AST: 13 U/L (ref 0–37)
Albumin: 3.4 g/dL — ABNORMAL LOW (ref 3.5–5.2)
Alkaline Phosphatase: 118 U/L — ABNORMAL HIGH (ref 39–117)
BUN: 15 mg/dL (ref 6–23)
CO2: 32 mEq/L (ref 19–32)
Calcium: 9.4 mg/dL (ref 8.4–10.5)
Chloride: 101 mEq/L (ref 96–112)
Creatinine, Ser: 0.96 mg/dL (ref 0.40–1.20)
GFR: 63.86 mL/min (ref >60.00)
Glucose, Bld: 124 mg/dL — ABNORMAL HIGH (ref 70–99)
Potassium: 4.3 mEq/L (ref 3.5–5.1)
Sodium: 138 mEq/L (ref 135–145)
Total Bilirubin: 0.2 mg/dL (ref 0.2–1.2)
Total Protein: 7.1 g/dL (ref 6.0–8.3)

## 2014-09-11 LAB — LIPID PANEL
Cholesterol: 151 mg/dL (ref 0–200)
HDL: 40.8 mg/dL (ref >39.00)
LDL Cholesterol: 90 mg/dL (ref 0–99)
NonHDL: 110.2
Total CHOL/HDL Ratio: 4
Triglycerides: 101 mg/dL (ref 0.0–149.0)
VLDL: 20.2 mg/dL (ref 0.0–40.0)

## 2014-09-11 LAB — TSH: TSH: 2.93 u[IU]/mL (ref 0.35–4.50)

## 2014-09-14 ENCOUNTER — Encounter: Payer: Self-pay | Admitting: Family Medicine

## 2014-09-14 ENCOUNTER — Other Ambulatory Visit (HOSPITAL_COMMUNITY)
Admission: RE | Admit: 2014-09-14 | Discharge: 2014-09-14 | Disposition: A | Discharge status: Home / Self Care | Payer: Managed Care, Other (non HMO) | Source: Ambulatory Visit | Attending: Family Medicine | Admitting: Family Medicine

## 2014-09-14 ENCOUNTER — Ambulatory Visit (INDEPENDENT_AMBULATORY_CARE_PROVIDER_SITE_OTHER): Payer: Managed Care, Other (non HMO) | Admitting: Family Medicine

## 2014-09-14 VITALS — BP 128/68 | HR 92 | Temp 98.0°F | Ht 61.5 in | Wt 285.2 lb

## 2014-09-14 DIAGNOSIS — Z1239 Encounter for other screening for malignant neoplasm of breast: Secondary | ICD-10-CM

## 2014-09-14 DIAGNOSIS — G4733 Obstructive sleep apnea (adult) (pediatric): Secondary | ICD-10-CM | POA: Diagnosis not present

## 2014-09-14 DIAGNOSIS — F17200 Nicotine dependence, unspecified, uncomplicated: Secondary | ICD-10-CM

## 2014-09-14 DIAGNOSIS — Z1211 Encounter for screening for malignant neoplasm of colon: Secondary | ICD-10-CM

## 2014-09-14 DIAGNOSIS — J441 Chronic obstructive pulmonary disease with (acute) exacerbation: Secondary | ICD-10-CM | POA: Insufficient documentation

## 2014-09-14 DIAGNOSIS — Z01419 Encounter for gynecological examination (general) (routine) without abnormal findings: Secondary | ICD-10-CM | POA: Insufficient documentation

## 2014-09-14 DIAGNOSIS — Z1151 Encounter for screening for human papillomavirus (HPV): Secondary | ICD-10-CM | POA: Insufficient documentation

## 2014-09-14 DIAGNOSIS — E781 Pure hyperglyceridemia: Secondary | ICD-10-CM | POA: Diagnosis not present

## 2014-09-14 DIAGNOSIS — I10 Essential (primary) hypertension: Secondary | ICD-10-CM

## 2014-09-14 DIAGNOSIS — Z0000 Encounter for general adult medical examination without abnormal findings: Secondary | ICD-10-CM | POA: Diagnosis not present

## 2014-09-14 DIAGNOSIS — Z720 Tobacco use: Secondary | ICD-10-CM | POA: Diagnosis not present

## 2014-09-14 DIAGNOSIS — Z9989 Dependence on other enabling machines and devices: Secondary | ICD-10-CM | POA: Insufficient documentation

## 2014-09-14 MED ORDER — DOXYCYCLINE HYCLATE 100 MG PO CAPS: 100.0000 mg | ORAL_CAPSULE | Freq: Two times a day (BID) | ORAL | Status: DC

## 2014-09-14 MED ORDER — CYCLOBENZAPRINE HCL 5 MG PO TABS: 5.0000 mg | ORAL_TABLET | Freq: Three times a day (TID) | ORAL | Status: DC | PRN

## 2014-09-14 MED ORDER — PREDNISONE 20 MG PO TABS: ORAL_TABLET | ORAL | Status: DC

## 2014-09-14 NOTE — Progress Notes (Signed)
Pre visit review using our clinic review tool, if applicable. No additional management support is needed unless otherwise documented below in the visit note. 

## 2014-09-14 NOTE — Assessment & Plan Note (Addendum)
Preventative protocols reviewed and updated unless pt declined. Discussed healthy diet and lifestyle.  Pelvic exam - ?cervical polyp - await pap smear

## 2014-09-14 NOTE — Assessment & Plan Note (Signed)
Restarted smoking 3 wks ago - strongly advised she quit all smoking.

## 2014-09-14 NOTE — Assessment & Plan Note (Signed)
Chronic, stable. Continue regimen. 

## 2014-09-14 NOTE — Patient Instructions (Addendum)
For COPD flare - treat with prednisone and doxycycline course. Return next week for follow up. Schedule follow up appointment with pulmonology for COPD and for sleep study. You need to quit smoking for lung health!  Pass by lab to pick up stool kit. We will schedule you for mammogram. Pap performed today.

## 2014-09-14 NOTE — Progress Notes (Signed)
BP 128/68 mmHg  Pulse 92  Temp(Src) 98 F (36.7 C) (Oral)  Ht 5' 1.5" (1.562 m)  Wt 285 lb 4 oz (129.389 kg)  BMI 53.03 kg/m2  SpO2 91%   CC: CPE, COPD exacerbation Subjective:    Patient ID: Brenda [name], female    DOB: 19-Nov-1960, 54 y.o.   MRN: [id number]  HPI: Brenda [name] is a 54 y.o. female presenting on 09/14/2014 for Annual Exam   COPD exacerbation - marked wheezing and progressive dyspnea on exertion over last several weeks. Increased cough, increased sputum production. Using xolair + albuterol at least once daily (recently Q4 hours). Nocturnal awakenings 5x/wk. Endorses compliance with dulera 2 puffs bid.    Last saw pulm Dr Joya Gaskins 1 yr ago.   Restarted smoking 3 weeks ago. 1/2 ppd. Correlates to COPD flare.   Lots of stress at home.  Concern for sleep apnea - marked daytime somnolence, falls asleep easily while in a conversation or while driving.  Preventative: Colon cancer screening - discussed, stool kit today. Mammogram - not since age 34 yo. No trouble noted on home breast exams. Well woman exam - remotely. H/o BTL.  Flu this year Pneumovax 2003 Seat belt use discussed Sunscreen use discussed  Lives with husband and son and daughter in law and grandchild Occupation: Art therapist at [employer] Activity: limited by dyspnea Diet: good water, fruits/vegetables daily  Relevant past medical, surgical, family and social history reviewed and updated as indicated. Interim medical history since our last visit reviewed. Allergies and medications reviewed and updated. Current Outpatient Prescriptions on File Prior to Visit  Medication Sig  . albuterol (VENTOLIN HFA) 108 (90 BASE) MCG/ACT inhaler Inhale 2 puffs every 4 hours as needed. **MUST HAVE FOLLOW UP FOR FURTHER REFILLS**  . cetirizine (ZYRTEC) 10 MG tablet Take 10 mg by mouth daily as needed for allergies.  . hydrochlorothiazide (HYDRODIURIL) 25 MG tablet Take 1 tablet (25 mg total)  by mouth daily. NEED TO SCHEDULE ANNUAL VISIT FOR MORE REFILLS  . levalbuterol (XOPENEX) 1.25 MG/3ML nebulizer solution Take 1.25 mg by nebulization 2 (two) times daily.  . mometasone-formoterol (DULERA) 100-5 MCG/ACT AERO Take 2 puffs first thing in am and then another 2 puffs about 12 hours later.  Marland Kitchen Respiratory Therapy Supplies (FLUTTER) DEVI Use as directed  . valsartan (DIOVAN) 160 MG tablet Take 1 tablet (160 mg total) by mouth daily.   No current facility-administered medications on file prior to visit.    Review of Systems  Constitutional: Negative for fever, chills, activity change, appetite change, fatigue and unexpected weight change.  HENT: Negative for hearing loss.   Eyes: Positive for visual disturbance (?ophthalmic migraines).  Respiratory: Positive for cough, chest tightness and shortness of breath. Negative for wheezing.   Cardiovascular: Positive for chest pain and leg swelling. Negative for palpitations.  Gastrointestinal: Negative for nausea, vomiting, abdominal pain, diarrhea, constipation, blood in stool and abdominal distention.  Genitourinary: Negative for hematuria and difficulty urinating.  Musculoskeletal: Negative for myalgias, arthralgias and neck pain.  Skin: Negative for rash.  Neurological: Negative for dizziness, seizures, syncope and headaches.  Hematological: Negative for adenopathy. Does not bruise/bleed easily.  Psychiatric/Behavioral: Negative for dysphoric mood. The patient is not nervous/anxious.    Per HPI unless specifically indicated above     Objective:    BP 128/68 mmHg  Pulse 92  Temp(Src) 98 F (36.7 C) (Oral)  Ht 5' 1.5" (1.562 m)  Wt 285 lb 4 oz (129.389 kg)  BMI 53.03 kg/m2  SpO2 91%  Wt Readings from Last 3 Encounters:  09/14/14 285 lb 4 oz (129.389 kg)  08/27/14 287 lb 6.4 oz (130.364 kg)  11/17/13 288 lb 3.2 oz (130.727 kg)    Physical Exam  Constitutional: She is oriented to person, place, and time. She appears  well-developed and well-nourished. No distress.  Morbidly obese Body mass index is 53.03 kg/(m^2).   HENT:  Head: Normocephalic and atraumatic.  Right Ear: Hearing, tympanic membrane, external ear and ear canal normal.  Left Ear: Hearing, tympanic membrane, external ear and ear canal normal.  Nose: Nose normal.  Mouth/Throat: Uvula is midline, oropharynx is clear and moist and mucous membranes are normal. No oropharyngeal exudate, posterior oropharyngeal edema or posterior oropharyngeal erythema.  Eyes: Conjunctivae and EOM are normal. Pupils are equal, round, and reactive to light. No scleral icterus.  Neck: Normal range of motion. Neck supple. No thyromegaly present.  Cardiovascular: Normal rate, regular rhythm, normal heart sounds and intact distal pulses.   No murmur heard. Pulses:      Radial pulses are 2+ on the right side, and 2+ on the left side.  Pulmonary/Chest: She is in respiratory distress. She has decreased breath sounds. She has wheezes (diffuse exp wheezing). She has no rhonchi. She has no rales. Right breast exhibits no inverted nipple, no mass, no nipple discharge, no skin change and no tenderness. Left breast exhibits no inverted nipple, no mass, no nipple discharge, no skin change and no tenderness.  Abdominal: Soft. Bowel sounds are normal. She exhibits no distension and no mass. There is no tenderness. There is no rebound and no guarding.  Genitourinary: Vagina normal and uterus normal. Pelvic exam was performed with patient supine. There is no rash, tenderness or lesion on the right labia. There is no rash, tenderness or lesion on the left labia. Cervix exhibits no motion tenderness, no discharge and no friability. Right adnexum displays no mass, no tenderness and no fullness. Left adnexum displays no mass, no tenderness and no fullness.  Musculoskeletal: Normal range of motion. She exhibits no edema.  Lymphadenopathy:       Head (right side): No submental, no  submandibular, no tonsillar, no preauricular and no posterior auricular adenopathy present.       Head (left side): No submental, no submandibular, no tonsillar, no preauricular and no posterior auricular adenopathy present.    She has no cervical adenopathy.    She has no axillary adenopathy.       Right axillary: No lateral adenopathy present.       Left axillary: No lateral adenopathy present.      Right: No supraclavicular adenopathy present.       Left: No supraclavicular adenopathy present.  Neurological: She is alert and oriented to person, place, and time.  CN grossly intact, station and gait intact  Skin: Skin is warm and dry. No rash noted.  Psychiatric: She has a normal mood and affect. Her behavior is normal. Judgment and thought content normal.  Nursing note and vitals reviewed.  Results for orders placed or performed in visit on 09/11/14  Lipid panel  Result Value Ref Range   Cholesterol 151 0 - 200 mg/dL   Triglycerides 101.0 0.0 - 149.0 mg/dL   HDL 40.80 >39.00 mg/dL   VLDL 20.2 0.0 - 40.0 mg/dL   LDL Cholesterol 90 0 - 99 mg/dL   Total CHOL/HDL Ratio 4    NonHDL 110.20   TSH  Result Value Ref Range  TSH 2.93 0.35 - 4.50 uIU/mL  Comprehensive metabolic panel  Result Value Ref Range   Sodium 138 135 - 145 mEq/L   Potassium 4.3 3.5 - 5.1 mEq/L   Chloride 101 96 - 112 mEq/L   CO2 32 19 - 32 mEq/L   Glucose, Bld 124 (H) 70 - 99 mg/dL   BUN 15 6 - 23 mg/dL   Creatinine, Ser 0.96 0.40 - 1.20 mg/dL   Total Bilirubin 0.2 0.2 - 1.2 mg/dL   Alkaline Phosphatase 118 (H) 39 - 117 U/L   AST 13 0 - 37 U/L   ALT 17 0 - 35 U/L   Total Protein 7.1 6.0 - 8.3 g/dL   Albumin 3.4 (L) 3.5 - 5.2 g/dL   Calcium 9.4 8.4 - 10.5 mg/dL   GFR 63.86 >60.00 mL/min  After albuterol/atrovent neb, improved air movement but persistent mild exp wheezing.    Assessment & Plan:   Problem List Items Addressed This Visit    Smoker    Restarted smoking 3 wks ago - strongly advised she quit  all smoking.      OSA (obstructive sleep apnea)    Obvious diagnosis based on story and exam. Will refer to pulm.       Relevant Orders   Ambulatory referral to Pulmonology   Morbid obesity    Discussed healthy diet and lifestyle changes to affect sustainable weight loss.      HYPERTRIGLYCERIDEMIA    Actually levels markedly improved off med. Continue to monitor.      HYPERTENSION, BENIGN ESSENTIAL    Chronic, stable. Continue regimen.      Health maintenance examination - Primary    Preventative protocols reviewed and updated unless pt declined. Discussed healthy diet and lifestyle.  Pelvic exam - ?cervical polyp - await pap smear      Relevant Orders   Cytology - PAP [place]   COPD exacerbation    Obvious exacerbation since she restarted smoking. Treat with doxy and prednisone course.  Breathing treatment in office today.      Relevant Medications   predniSONE (DELTASONE) 20 MG tablet   Other Relevant Orders   Ambulatory referral to Pulmonology    Other Visit Diagnoses    Breast cancer screening        Relevant Orders    MM DIGITAL SCREENING BILATERAL    Special screening for malignant neoplasms, colon        Relevant Orders    Fecal occult blood, imunochemical        Follow up plan: Return in about 3 days (around 09/17/2014), or if symptoms worsen or fail to improve, for follow up visit.

## 2014-09-14 NOTE — Assessment & Plan Note (Signed)
Obvious diagnosis based on story and exam. Will refer to pulm.

## 2014-09-14 NOTE — Assessment & Plan Note (Signed)
Actually levels markedly improved off med. Continue to monitor.

## 2014-09-14 NOTE — Assessment & Plan Note (Signed)
Discussed healthy diet and lifestyle changes to affect sustainable weight loss  

## 2014-09-14 NOTE — Assessment & Plan Note (Signed)
Obvious exacerbation since she restarted smoking. Treat with doxy and prednisone course.  Breathing treatment in office today.

## 2014-09-18 ENCOUNTER — Other Ambulatory Visit: Payer: Self-pay | Admitting: Family Medicine

## 2014-09-18 LAB — CYTOLOGY - PAP

## 2014-09-18 LAB — HM PAP SMEAR: HM Pap smear: NORMAL

## 2014-09-19 MED ORDER — ALBUTEROL SULFATE HFA 108 (90 BASE) MCG/ACT IN AERS: 2.0000 | INHALATION_SPRAY | RESPIRATORY_TRACT | Status: DC | PRN

## 2014-09-20 ENCOUNTER — Encounter: Payer: Self-pay | Admitting: Family Medicine

## 2014-09-20 ENCOUNTER — Ambulatory Visit (INDEPENDENT_AMBULATORY_CARE_PROVIDER_SITE_OTHER): Payer: Managed Care, Other (non HMO) | Admitting: Family Medicine

## 2014-09-20 ENCOUNTER — Encounter: Payer: Self-pay | Admitting: *Deleted

## 2014-09-20 VITALS — BP 118/76 | HR 85 | Temp 98.1°F | Wt 286.2 lb

## 2014-09-20 DIAGNOSIS — Z720 Tobacco use: Secondary | ICD-10-CM | POA: Diagnosis not present

## 2014-09-20 DIAGNOSIS — J441 Chronic obstructive pulmonary disease with (acute) exacerbation: Secondary | ICD-10-CM

## 2014-09-20 DIAGNOSIS — F17200 Nicotine dependence, unspecified, uncomplicated: Secondary | ICD-10-CM

## 2014-09-20 MED ORDER — PREDNISONE 20 MG PO TABS: ORAL_TABLET | ORAL | Status: DC

## 2014-09-20 MED ORDER — IPRATROPIUM BROMIDE 0.02 % IN SOLN
0.5000 mg | Freq: Once | RESPIRATORY_TRACT | Status: AC
  Administered 2014-09-20: 0.5 mg via RESPIRATORY_TRACT

## 2014-09-20 MED ORDER — ALBUTEROL SULFATE (2.5 MG/3ML) 0.083% IN NEBU
2.5000 mg | INHALATION_SOLUTION | Freq: Once | RESPIRATORY_TRACT | Status: AC
  Administered 2014-09-20: 2.5 mg via RESPIRATORY_TRACT

## 2014-09-20 NOTE — Assessment & Plan Note (Signed)
Recently restarted. Strongly encouraged she must quit smoking to prevent further permanent lung damage.

## 2014-09-20 NOTE — Progress Notes (Signed)
BP 118/76 mmHg  Pulse 85  Temp(Src) 98.1 F (36.7 C) (Oral)  Wt 286 lb 4 oz (129.842 kg)  SpO2 95%   CC: f/u visit  Subjective:    Patient ID: Brenda [name], female    DOB: 06-06-1960, 54 y.o.   MRN: [id number]  HPI: Brenda BarrenJudy L Yamamoto is a 54 y.o. female presenting on 09/20/2014 for Follow-up   See prior note for details. Seen here 09/13/2013 for CPE and at that time had COPD exacerbation. Treated with albuterol/atrovent neb in office and prednisone/doxycycline course. Last albuterol was 11am.   Persistent head congestion and chest tightness. No fevers. Productive cough improving but still present.   Smoking - <1/2 ppd.   Relevant past medical, surgical, family and social history reviewed and updated as indicated. Interim medical history since our last visit reviewed. Allergies and medications reviewed and updated. Current Outpatient Prescriptions on File Prior to Visit  Medication Sig  . albuterol (VENTOLIN HFA) 108 (90 BASE) MCG/ACT inhaler Inhale 2 puffs into the lungs every 4 (four) hours as needed for wheezing or shortness of breath.  . cetirizine (ZYRTEC) 10 MG tablet Take 10 mg by mouth daily as needed for allergies.  . cyclobenzaprine (FLEXERIL) 5 MG tablet Take 1 tablet (5 mg total) by mouth 3 (three) times daily as needed for muscle spasms.  Marland Kitchen. doxycycline (VIBRAMYCIN) 100 MG capsule Take 1 capsule (100 mg total) by mouth 2 (two) times daily.  . hydrochlorothiazide (HYDRODIURIL) 25 MG tablet Take 1 tablet (25 mg total) by mouth daily. NEED TO SCHEDULE ANNUAL VISIT FOR MORE REFILLS  . levalbuterol (XOPENEX) 1.25 MG/3ML nebulizer solution Take 1.25 mg by nebulization 2 (two) times daily.  . mometasone-formoterol (DULERA) 100-5 MCG/ACT AERO Take 2 puffs first thing in am and then another 2 puffs about 12 hours later.  Marland Kitchen. Respiratory Therapy Supplies (FLUTTER) DEVI Use as directed  . valsartan (DIOVAN) 160 MG tablet Take 1 tablet (160 mg total) by mouth daily.   No current  facility-administered medications on file prior to visit.    Review of Systems Per HPI unless specifically indicated above     Objective:    BP 118/76 mmHg  Pulse 85  Temp(Src) 98.1 F (36.7 C) (Oral)  Wt 286 lb 4 oz (129.842 kg)  SpO2 95%  Wt Readings from Last 3 Encounters:  09/20/14 286 lb 4 oz (129.842 kg)  09/14/14 285 lb 4 oz (129.389 kg)  08/27/14 287 lb 6.4 oz (130.364 kg)    Physical Exam  Constitutional: She appears well-developed and well-nourished. No distress.  obese  HENT:  Mouth/Throat: Oropharynx is clear and moist. No oropharyngeal exudate.  Neck: Normal range of motion. Neck supple.  Cardiovascular: Normal rate, regular rhythm, normal heart sounds and intact distal pulses.   No murmur heard. Pulmonary/Chest: She is in respiratory distress. She has decreased breath sounds. She has wheezes (expiratory throughout). She has no rhonchi. She has no rales.  Tight air movement throughout, talks in complete sentences but struggles some to do this. Short winded.  Skin: Skin is warm and dry. No rash noted.  Psychiatric: She has a normal mood and affect.  Vitals reviewed.  After albuterol/atrovent neb -  Improved air movement, decreased wheezing, breathes easier.    Assessment & Plan:   Problem List Items Addressed This Visit    Smoker    Recently restarted. Strongly encouraged she must quit smoking to prevent further permanent lung damage.      COPD exacerbation - Primary  Persistent COPD exacerbation despite 5 days prednisone and doxycycline. Will extend prednisone course and increase dose. Finish doxy. Schedule albuterol inhaler ever 4-6 hours over weekend. Update if no improvement next week for further eval, consider CXR. Not consistent today with PNA. Pt agrees with plan.      Relevant Medications   predniSONE (DELTASONE) 20 MG tablet       Follow up plan: Return if symptoms worsen or fail to improve.

## 2014-09-20 NOTE — Assessment & Plan Note (Signed)
Persistent COPD exacerbation despite 5 days prednisone and doxycycline. Will extend prednisone course and increase dose. Finish doxy. Schedule albuterol inhaler ever 4-6 hours over weekend. Update if no improvement next week for further eval, consider CXR. Not consistent today with PNA. Pt agrees with plan.

## 2014-09-20 NOTE — Patient Instructions (Addendum)
For persistent COPD exacerbation - let's extend steroid course. Schedule albuterol inhaler three times a day over weekend. Finish doxycycline antibiotic. If worsening over weekend (shortness of breath, cough or fever), seek urgent care. Update us next week - if no better, return to see me early next week for further evaluation.

## 2014-09-20 NOTE — Addendum Note (Signed)
Addended by: Annamarie MajorFUQUAY, LUGENE S on: 09/20/2014 06:04 PM   Modules accepted: Orders

## 2014-09-20 NOTE — Progress Notes (Signed)
Pre visit review using our clinic review tool, if applicable. No additional management support is needed unless otherwise documented below in the visit note. 

## 2014-09-24 ENCOUNTER — Encounter: Payer: Self-pay | Admitting: Family Medicine

## 2014-09-24 ENCOUNTER — Other Ambulatory Visit: Payer: Managed Care, Other (non HMO)

## 2014-09-24 ENCOUNTER — Ambulatory Visit
Admission: RE | Admit: 2014-09-24 | Discharge: 2014-09-24 | Disposition: A | Discharge status: Home / Self Care | Payer: Managed Care, Other (non HMO) | Source: Ambulatory Visit | Attending: Family Medicine | Admitting: Family Medicine

## 2014-09-24 DIAGNOSIS — Z1239 Encounter for other screening for malignant neoplasm of breast: Secondary | ICD-10-CM

## 2014-09-24 DIAGNOSIS — Z1211 Encounter for screening for malignant neoplasm of colon: Secondary | ICD-10-CM

## 2014-09-24 LAB — FECAL OCCULT BLOOD, IMMUNOCHEMICAL: Fecal Occult Bld: NEGATIVE

## 2014-09-24 LAB — FECAL OCCULT BLOOD, GUAIAC: Fecal Occult Blood: NEGATIVE

## 2014-09-24 NOTE — Telephone Encounter (Signed)
Please see Mychart message from patient.  

## 2014-09-25 ENCOUNTER — Encounter: Payer: Self-pay | Admitting: *Deleted

## 2014-09-25 LAB — HM MAMMOGRAPHY: HM Mammogram: NORMAL

## 2014-09-25 NOTE — Telephone Encounter (Signed)
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

## 2014-09-26 ENCOUNTER — Encounter: Payer: Self-pay | Admitting: *Deleted

## 2014-10-02 ENCOUNTER — Other Ambulatory Visit: Payer: Self-pay | Admitting: Internal Medicine

## 2014-10-02 ENCOUNTER — Other Ambulatory Visit: Payer: Self-pay | Admitting: Family Medicine

## 2014-10-03 NOTE — Telephone Encounter (Signed)
Ok to refill? Not on current med list. 

## 2014-11-01 ENCOUNTER — Ambulatory Visit (INDEPENDENT_AMBULATORY_CARE_PROVIDER_SITE_OTHER): Payer: Managed Care, Other (non HMO) | Admitting: Pulmonary Disease

## 2014-11-01 ENCOUNTER — Encounter: Payer: Self-pay | Admitting: Pulmonary Disease

## 2014-11-01 VITALS — BP 104/70 | HR 83 | Ht 63.0 in | Wt 292.0 lb

## 2014-11-01 DIAGNOSIS — R05 Cough: Secondary | ICD-10-CM | POA: Diagnosis not present

## 2014-11-01 DIAGNOSIS — Z720 Tobacco use: Secondary | ICD-10-CM | POA: Diagnosis not present

## 2014-11-01 DIAGNOSIS — G4733 Obstructive sleep apnea (adult) (pediatric): Secondary | ICD-10-CM

## 2014-11-01 DIAGNOSIS — R058 Other specified cough: Secondary | ICD-10-CM

## 2014-11-01 NOTE — Patient Instructions (Signed)
Will arrange for home sleep study Will call to arrange for follow up after sleep study reviewed  

## 2014-11-01 NOTE — Progress Notes (Deleted)
   Subjective:    Patient ID: Brenda [name], female    DOB: 08-27-1960, 54 y.o.   MRN: [id number]  HPI    Review of Systems  Constitutional: Negative for fever and unexpected weight change.  HENT: Negative for congestion, dental problem, ear pain, nosebleeds, postnasal drip, rhinorrhea, sinus pressure, sneezing, sore throat and trouble swallowing.   Eyes: Negative for redness and itching.  Respiratory: Positive for cough, chest tightness and shortness of breath.   Cardiovascular: Positive for leg swelling. Negative for palpitations.  Gastrointestinal: Negative for nausea and vomiting.  Genitourinary: Negative for dysuria.  Musculoskeletal: Negative for joint swelling.  Skin: Negative for rash.  Neurological: Negative for headaches.  Hematological: Does not bruise/bleed easily.  Psychiatric/Behavioral: Negative for dysphoric mood. The patient is not nervous/anxious.        Objective:   Physical Exam        Assessment & Plan:

## 2014-11-01 NOTE — Progress Notes (Signed)
Chief Complaint  Patient presents with  . Sleep Consult    Referred by Dr. Sharen Hones. Pt c/o snoring and waking up throughout the night. EPworth:19    History of Present Illness: Brenda [name] is a 54 y.o. female for evaluation of sleep problems.  She has been worried about feeling sleepy during the day.  She has a hard time stay awake at work.  She has almost fallen asleep while driving.  She snores, and will wake up feeling like she is choked.  She can't sleep on her back.  She has been told she stops breathing while asleep.  She is a restless sleeper and has trouble with back pains.  She will have to use nebulizer sometimes at night and will then sleep in a recliner.    She continues to smoke.  She has upper airway wheezing.  She was seen previously by Dr. Delford Field and Dr. Sherene Sires for this.    She goes to sleep at 11 pm.  She falls asleep 30.  She wakes up 1 or 2 times to use the bathroom.  She gets out of bed at 515 am.  She feels tired in the morning.  She denies morning headache.  She does not use anything to help her fall sleep or stay awake.  She denies sleep walking, sleep talking, bruxism, or nightmares.  There is no history of restless legs.  She denies sleep hallucinations, sleep paralysis, or cataplexy.  The Epworth score is 19 out of 24.  Tests: PFT 07/29/06 >> FEV1 1.75 (73%), FEV1% 60, TLC 4.81 (105%), DLCO 55%, +BD  [name]  has a past medical history of Bronchitis, chronic obstructive (2008 & 2009); Nonspecific abnormal electrocardiogram (ECG) (EKG); Leukocytosis, unspecified; HTN (hypertension); migraines; Bipolar I disorder, most recent episode (or current) manic, unspecified; Pure hyperglyceridemia; Acute pancreatitis; COPD (chronic obstructive pulmonary disease); Nicotine addiction; Asthma; Obesity; Swelling of limb; Knee pain, right; Suicide attempt (06/2001); History of pyelonephritis (05/2012); Lumbar disc disease with radiculopathy; and Urge and stress incontinence  (07/2012).  [name]  has past surgical history that includes lumbar MRI (11/2010); Cholecystectomy (07/1982); Tubal ligation; Retained stone; Right knee surgery ([id number]); ERCP / sphincterotomy - stenosis (01/15/06); ct maxillofacial wo/w cm (06/14/06); CT of chest (06/14/2006); and [place] - Pneumonia, acute asthma, left maxillary sinusitis (01/11 - 06/18/2006).  Prior to Admission medications   Medication Sig Start Date End Date Taking? Authorizing Provider  albuterol (PROVENTIL) (2.5 MG/3ML) 0.083% nebulizer solution USE ONE VIAL IN NEBULIZER EVERY 6 HOURS AS NEEDED FOR WHEEZING OR SHORTNESS OF BREATH 10/04/14  Yes [name], MD  albuterol (VENTOLIN HFA) 108 (90 BASE) MCG/ACT inhaler Inhale 2 puffs into the lungs every 4 (four) hours as needed for wheezing or shortness of breath. 09/19/14  Yes [name], MD  cetirizine (ZYRTEC) 10 MG tablet Take 10 mg by mouth daily as needed for allergies.   Yes Historical Provider, MD  cyclobenzaprine (FLEXERIL) 5 MG tablet Take 1 tablet (5 mg total) by mouth 3 (three) times daily as needed for muscle spasms. 09/14/14  Yes [name], MD  hydrochlorothiazide (HYDRODIURIL) 25 MG tablet Take 1 tablet (25 mg total) by mouth daily. NEED TO SCHEDULE ANNUAL VISIT FOR MORE REFILLS 08/09/14  Yes [name], MD  levalbuterol [facility]) 1.25 MG/3ML nebulizer solution USE ONE VIAL IN NEBULIZER EVERY 4 HOURS AS NEEDED FOR  WHEEZING 10/03/14  Yes Nyoka Cowden, MD  Respiratory Therapy Supplies (FLUTTER) DEVI Use as directed 09/22/13  Yes Casimiro Needle  Denice ParadiseB Wert, MD  valsartan (DIOVAN) 160 MG tablet TAKE ONE TABLET BY MOUTH ONCE DAILY 10/03/14  Yes Nyoka CowdenMichael B Wert, MD  mometasone-formoterol [facility](DULERA) 100-5 MCG/ACT AERO Take 2 puffs first thing in am and then another 2 puffs about 12 hours later. Patient not taking: Reported on 11/01/2014 11/17/13   Nyoka CowdenMichael B Wert, MD    Allergies  Allergen Reactions  . Metolazone     REACTION: metabolic mood  swings    Her family history includes Asthma in her father; Breast cancer in her paternal grandmother; Heart disease in her brother; Hyperlipidemia in her mother; Hypertension in her father and mother.  She  reports that she has been smoking Cigarettes.  She has a 15 pack-year smoking history. She has never used smokeless tobacco. She reports that she does not drink alcohol or use illicit drugs.  Review of Systems  Constitutional: Negative for fever and unexpected weight change.  HENT: Negative for congestion, dental problem, ear pain, nosebleeds, postnasal drip, rhinorrhea, sinus pressure, sneezing, sore throat and trouble swallowing.   Eyes: Negative for redness and itching.  Respiratory: Positive for cough, chest tightness and shortness of breath.   Cardiovascular: Positive for leg swelling. Negative for palpitations.  Gastrointestinal: Negative for nausea and vomiting.  Genitourinary: Negative for dysuria.  Musculoskeletal: Negative for joint swelling.  Skin: Negative for rash.  Neurological: Negative for headaches.  Hematological: Does not bruise/bleed easily.  Psychiatric/Behavioral: Negative for dysphoric mood. The patient is not nervous/anxious.    Physical Exam: Blood pressure 104/70, pulse 83, height 5\' 3"  (1.6 m), weight 292 lb (132.45 kg), SpO2 98 %. Body mass index is 51.74 kg/(m^2).  General - No distress ENT - No sinus tenderness, no oral exudate, no LAN, no thyromegaly, TM clear, pupils equal/reactive, MP 4, 2+ tonsils, upper airway wheeze Cardiac - s1s2 regular, no murmur, pulses symmetric Chest - No wheeze/rales/dullness, good air entry, normal respiratory excursion Back - No focal tenderness Abd - Soft, non-tender, no organomegaly, + bowel sounds Ext - No edema Neuro - Normal strength, cranial nerves intact Skin - No rashes Psych - Normal mood, and behavior  Discussion: She has snoring, sleep disruption, witnessed apnea, and daytime sleepiness.  She has hx of  HTN.  I am concerned she could have sleep apnea.  We discussed how sleep apnea can affect various health problems including risks for hypertension, cardiovascular disease, and diabetes.  We also discussed how sleep disruption can increase risks for accident, such as while driving.  Weight loss as a means of improving sleep apnea was also reviewed.  Additional treatment options discussed were CPAP therapy, oral appliance, and surgical intervention.  She also has history of tobacco abuse.  She has upper airway wheezing.  Assessment/plan:  Obstructive sleep apnea. Plan: - will arrange for home sleep study pending insurance approval  Morbid obesity. Plan: - discussed options to assist with weight loss  Upper airway wheezing. Plan: - she will follow up with her PCP - she is to continue her inhaler regimen - explained how this could also be related to post-nasal drip and reflux >> she would like to defer assessment of this  Tobacco abuse. Plan: - encouraged her to continue with her smoking cessation efforts   Coralyn HellingVineet Timea Breed, M.D. Pager [phone]

## 2014-11-09 ENCOUNTER — Other Ambulatory Visit: Payer: Self-pay | Admitting: Family Medicine

## 2014-11-22 ENCOUNTER — Other Ambulatory Visit: Payer: Self-pay | Admitting: Family Medicine

## 2014-11-22 ENCOUNTER — Other Ambulatory Visit: Payer: Self-pay | Admitting: Internal Medicine

## 2014-12-08 DIAGNOSIS — G4730 Sleep apnea, unspecified: Secondary | ICD-10-CM | POA: Diagnosis not present

## 2014-12-11 ENCOUNTER — Telehealth: Payer: Self-pay | Admitting: Pulmonary Disease

## 2014-12-11 ENCOUNTER — Other Ambulatory Visit: Payer: Self-pay | Admitting: *Deleted

## 2014-12-11 DIAGNOSIS — G4733 Obstructive sleep apnea (adult) (pediatric): Secondary | ICD-10-CM

## 2014-12-11 DIAGNOSIS — G4730 Sleep apnea, unspecified: Secondary | ICD-10-CM | POA: Diagnosis not present

## 2014-12-11 NOTE — Telephone Encounter (Signed)
HST 12/08/14 >> AHI 58.6, SaO2 low 64%.  Will have my nurse inform pt that sleep study shows severe sleep apnea.  Options are 1) CPAP now, 2) ROV first.  If She is agreeable to CPAP, then please send order for auto CPAP range 5 to 15 cm H2O with heated humidity and mask of choice.  Have download sent 1 month after starting CPAP and set up ROV 2 months after starting CPAP.

## 2014-12-12 NOTE — Telephone Encounter (Signed)
LMTCB x 1 

## 2014-12-12 NOTE — Telephone Encounter (Signed)
Pt is aware of results. CPAP order. appt scheduled for ROV 9/26. Nothing further needed

## 2014-12-17 ENCOUNTER — Encounter: Payer: Self-pay | Admitting: Internal Medicine

## 2014-12-17 ENCOUNTER — Ambulatory Visit (INDEPENDENT_AMBULATORY_CARE_PROVIDER_SITE_OTHER): Payer: Managed Care, Other (non HMO) | Admitting: Internal Medicine

## 2014-12-17 VITALS — BP 124/76 | HR 91 | Temp 98.1°F | Wt 288.0 lb

## 2014-12-17 DIAGNOSIS — N390 Urinary tract infection, site not specified: Secondary | ICD-10-CM

## 2014-12-17 DIAGNOSIS — R319 Hematuria, unspecified: Secondary | ICD-10-CM | POA: Diagnosis not present

## 2014-12-17 DIAGNOSIS — R300 Dysuria: Secondary | ICD-10-CM

## 2014-12-17 LAB — POCT URINALYSIS DIPSTICK
Glucose, UA: NEGATIVE
Protein, UA: POSITIVE
Spec Grav, UA: >=1.03
Urobilinogen, UA: 1
pH, UA: 5.5

## 2014-12-17 MED ORDER — CIPROFLOXACIN HCL 500 MG PO TABS: 500.0000 mg | ORAL_TABLET | Freq: Two times a day (BID) | ORAL | Status: DC

## 2014-12-17 NOTE — Progress Notes (Signed)
Pre visit review using our clinic review tool, if applicable. No additional management support is needed unless otherwise documented below in the visit note. 

## 2014-12-17 NOTE — Patient Instructions (Signed)

## 2014-12-17 NOTE — Progress Notes (Signed)
HPI  Pt presents to the clinic today with c/o urgency, frequency, dysuria and bladder pressure. This started 2-3 days ago. She has also noticed some low back pain and blood in her urine. She denies fever, chills or body aches. She denies vaginal complaints. She has tried AZO without much relief.   Review of Systems  Past Medical History  Diagnosis Date  . Bronchitis, chronic obstructive 2008 & 2009    FeV1 64% TLC 105% DLCO 55% 2008  -  FeV1 81% FeF 25-75 43% 2009  . Nonspecific abnormal electrocardiogram (ECG) (EKG)   . Leukocytosis, unspecified   . HTN (hypertension)   . Hx of migraines   . Bipolar I disorder, most recent episode (or current) manic, unspecified     pt denies this  . Pure hyperglyceridemia   . Acute pancreatitis   . COPD (chronic obstructive pulmonary disease)     emphysema, not an issue per pulm (10/2013)  . Nicotine addiction   . Asthma     main dyspnea thought due to deconditioning (10/2013)  . Obesity   . Swelling of limb   . Knee pain, right   . Suicide attempt 06/2001  . History of pyelonephritis 05/2012  . Lumbar disc disease with radiculopathy     multilevel spondylitic changes, scoliosis, and anterolisthesis L4 not thought to currently be surg candidate (Dr. Phoebe Perch, [facility]) (Dr. Ollen Bowl, [place])  . Urge and stress incontinence 07/2012    ([place])    Family History  Problem Relation Age of Onset  . Hypertension Mother   . Hyperlipidemia Mother   . Hypertension Father   . Heart disease Brother     MI in early 55's  . Asthma Father   . Breast cancer Paternal Grandmother     History   Social History  . Marital Status: Married    Spouse Name: N/A  . Number of Children: 3  . Years of Education: N/A   Occupational History  . Nursing Secretary   .     Social History Main Topics  . Smoking status: Current Every Day Smoker -- 0.50 packs/day for 30 years    Types: Cigarettes    Last Attempt to Quit: 01/30/2013  . Smokeless tobacco: Never  Used  . Alcohol Use: No  . Drug Use: No  . Sexual Activity: Not on file   Other Topics Concern  . Not on file   Social History Narrative   Lives with husband and son and daughter in law and grandchild   Occupation: Media planner at [employer]   Activity: limited by dyspnea   Diet: good water, fruits/vegetables daily    Allergies  Allergen Reactions  . Metolazone     REACTION: metabolic mood swings    Constitutional: Denies fever, malaise, fatigue, headache or abrupt weight changes.   GU: Pt reports urgency, frequency and pain with urination. Denies burning sensation, blood in urine, odor or discharge. Skin: Denies redness, rashes, lesions or ulcercations.   No other specific complaints in a complete review of systems (except as listed in HPI above).    Objective:   Physical Exam  BP 124/76 mmHg  Pulse 91  Temp(Src) 98.1 F (36.7 C) (Oral)  Wt 288 lb (130.636 kg)  SpO2 96% Wt Readings from Last 3 Encounters:  12/17/14 288 lb (130.636 kg)  11/01/14 292 lb (132.45 kg)  09/20/14 286 lb 4 oz (129.842 kg)    General: Appears her stated age, obese in NAD. Cardiovascular: Normal rate  and rhythm. S1,S2 noted.   Pulmonary/Chest: Normal effort and positive vesicular breath sounds. No respiratory distress. No wheezes, rales or ronchi noted.  Abdomen: Soft. Normal bowel sounds, no bruits noted. No distention or masses noted. Tender to palpation over the bladder area. No CVA tenderness.      Assessment & Plan:   Urgency, Frequency, Dysuria secondary to  UTI:  Urinalysis: 3+ leuks, 3+ blood, pos nitrites Will send urine culture eRx sent if for Cipro 500 mg BID x 5 days OK to take AZO OTC Drink plenty of fluids  RTC as needed or if symptoms persist.

## 2014-12-19 LAB — URINE CULTURE: Colony Count: >=100000

## 2015-01-29 ENCOUNTER — Other Ambulatory Visit: Payer: Self-pay | Admitting: Family Medicine

## 2015-02-04 ENCOUNTER — Encounter: Payer: Self-pay | Admitting: Family Medicine

## 2015-02-05 MED ORDER — ALBUTEROL SULFATE (2.5 MG/3ML) 0.083% IN NEBU: INHALATION_SOLUTION | RESPIRATORY_TRACT | Status: DC

## 2015-02-25 ENCOUNTER — Ambulatory Visit (INDEPENDENT_AMBULATORY_CARE_PROVIDER_SITE_OTHER): Payer: Managed Care, Other (non HMO) | Admitting: Pulmonary Disease

## 2015-02-25 ENCOUNTER — Encounter: Payer: Self-pay | Admitting: Pulmonary Disease

## 2015-02-25 VITALS — BP 138/84 | HR 86 | Temp 98.7°F | Ht 63.0 in | Wt 292.0 lb

## 2015-02-25 DIAGNOSIS — Z720 Tobacco use: Secondary | ICD-10-CM | POA: Diagnosis not present

## 2015-02-25 DIAGNOSIS — J45901 Unspecified asthma with (acute) exacerbation: Secondary | ICD-10-CM | POA: Diagnosis not present

## 2015-02-25 DIAGNOSIS — G4733 Obstructive sleep apnea (adult) (pediatric): Secondary | ICD-10-CM | POA: Diagnosis not present

## 2015-02-25 DIAGNOSIS — Z9989 Dependence on other enabling machines and devices: Secondary | ICD-10-CM

## 2015-02-25 MED ORDER — PREDNISONE 10 MG PO TABS: ORAL_TABLET | ORAL | Status: DC

## 2015-02-25 MED ORDER — DOXYCYCLINE HYCLATE 100 MG PO TABS: 100.0000 mg | ORAL_TABLET | Freq: Two times a day (BID) | ORAL | Status: DC

## 2015-02-25 NOTE — Patient Instructions (Signed)
Prednisone 10 mg pill >> 3 pills daily for 2 days, 2 pills daily for 2 days, 1 pill daily for 2 days Doxycycline 100 mg twice per day  Follow up in 4 weeks with Dr. Craige Cotta or Tammy Parrett

## 2015-02-25 NOTE — Progress Notes (Signed)
Chief Complaint  Patient presents with  . Follow-up    pt states she is having an increase SOB. pt has been using her nebulizer all weeklend with barley any relief. pt c/o chest feeling full. pt c/o productive cough yellowish in color.  pt using CPAP machine every night for about 6 hours a night. pressure and mask good for patient.     History of Present Illness: Brenda [name] is a 54 y.o. female smoker with OSA and COPD.  Since her last visit she had sleep study which showed severe sleep apnea.  She was started on CPAP and has been doing well.  She is sleeping better and no longer falls asleep during the day.  She continues to smoke.  She developed more cough, chest tightness, and wheeze.  This has been going on for the past few days.  She is feeling feverish and has chills.  She is coughing up yellow sputum  She denies hemoptysis.   TESTS: PFT 07/29/06 >> FEV1 1.75 (73%), FEV1% 60, TLC 4.81 (105%), DLCO 55%, + BD HST 12/08/14 >> AHI 58.6, SaO2 low 64%.  PMhx >> HTN, Migraines, Bipolar, HLD, Pancreatitis, Back pain  Past surgical hx, Medications, Allergies, Family hx, Social hx all reviewed.   Physical Exam: BP 138/84 mmHg  Pulse 86  Temp(Src) 98.7 F (37.1 C) (Oral)  Ht  (1.6 m)  Wt 292 lb (132.45 kg)  BMI 51.74 kg/m2  SpO2 95%  General - No distress ENT - No sinus tenderness, no oral exudate, no LAN Cardiac - s1s2 regular, no murmur Chest - b/l expiratory wheezing Back - No focal tenderness Abd - Soft, non-tender Ext - No edema Neuro - Normal strength Skin - No rashes Psych - normal mood, and behavior   Assessment/Plan:  Acute asthmatic bronchitis in setting of COPD and tobacco abuse. Plan: - will give course of prednisone and doxycycline - she is to continue dulera, and prn albuterol/xopenex - emphasized importance of smoking cessation - will need to re-assess her COPD at next visit >> A1AT, CXR, PFT  Obstructive sleep apnea. She reports improvement with  CPAP. Plan: - will continue auto CPAP  Morbid obesity. Plan: - discussed importance of weight loss    Coralyn Helling, MD [place] [facility] Pager:  [phone]

## 2015-03-25 ENCOUNTER — Ambulatory Visit (INDEPENDENT_AMBULATORY_CARE_PROVIDER_SITE_OTHER)
Admission: RE | Admit: 2015-03-25 | Discharge: 2015-03-25 | Disposition: A | Discharge status: Home / Self Care | Payer: Managed Care, Other (non HMO) | Source: Ambulatory Visit | Attending: Adult Health | Admitting: Adult Health

## 2015-03-25 ENCOUNTER — Encounter: Payer: Self-pay | Admitting: Adult Health

## 2015-03-25 ENCOUNTER — Ambulatory Visit (INDEPENDENT_AMBULATORY_CARE_PROVIDER_SITE_OTHER): Payer: Managed Care, Other (non HMO) | Admitting: Adult Health

## 2015-03-25 ENCOUNTER — Other Ambulatory Visit (INDEPENDENT_AMBULATORY_CARE_PROVIDER_SITE_OTHER): Payer: Managed Care, Other (non HMO)

## 2015-03-25 VITALS — BP 136/82 | HR 89 | Temp 98.3°F | Ht 63.0 in | Wt 288.0 lb

## 2015-03-25 DIAGNOSIS — J441 Chronic obstructive pulmonary disease with (acute) exacerbation: Secondary | ICD-10-CM

## 2015-03-25 DIAGNOSIS — G4733 Obstructive sleep apnea (adult) (pediatric): Secondary | ICD-10-CM

## 2015-03-25 DIAGNOSIS — Z720 Tobacco use: Secondary | ICD-10-CM

## 2015-03-25 DIAGNOSIS — F17200 Nicotine dependence, unspecified, uncomplicated: Secondary | ICD-10-CM

## 2015-03-25 LAB — BASIC METABOLIC PANEL
BUN: 16 mg/dL (ref 6–23)
CO2: 33 mEq/L — ABNORMAL HIGH (ref 19–32)
Calcium: 9.7 mg/dL (ref 8.4–10.5)
Chloride: 97 mEq/L (ref 96–112)
Creatinine, Ser: 0.95 mg/dL (ref 0.40–1.20)
GFR: 64.51 mL/min (ref >60.00)
Glucose, Bld: 112 mg/dL — ABNORMAL HIGH (ref 70–99)
Potassium: 3.9 mEq/L (ref 3.5–5.1)
Sodium: 137 mEq/L (ref 135–145)

## 2015-03-25 MED ORDER — BUDESONIDE-FORMOTEROL FUMARATE 160-4.5 MCG/ACT IN AERO: 2.0000 | INHALATION_SPRAY | Freq: Two times a day (BID) | RESPIRATORY_TRACT | Status: DC

## 2015-03-25 MED ORDER — TIOTROPIUM BROMIDE MONOHYDRATE 18 MCG IN CAPS: 18.0000 ug | ORAL_CAPSULE | Freq: Every day | RESPIRATORY_TRACT | Status: DC

## 2015-03-25 NOTE — Patient Instructions (Addendum)
Stop Dulera  Restart Symbicort 2 puffs Twice daily   Restart Spiriva 1 puff daily .  Work on not smoking.  Keep up good work with CPAP At bedtime   Work on weight loss  Do not drive if sleepy.  Chest xray and labs today.  Follow up Dr. Craige CottaSood  In 2 months with PFT  Please contact office for sooner follow up if symptoms do not improve or worsen or seek emergency care

## 2015-03-25 NOTE — Progress Notes (Signed)
   Subjective:    Patient ID: Brenda [name], female    DOB: 09/07/1960, 54 y.o.   MRN: [id number]  HPI 54 yo female former smoker with OSA and COPD  TESTS: PFT 07/29/06 >> FEV1 1.75 (73%), FEV1% 60, TLC 4.81 (105%), DLCO 55%, + BD HST 12/08/14 >> AHI 58.6, SaO2 low 64%.  03/25/15 Follow up : OSA and COPD  Pt returns for 1 month follow up .  She was seen last ov with COPD flare given Doxycycline and pred taper. She is feeling better. She still have some congestion and white mucus. Gets winded easily.  Wants to go back on Symbicort. Has been off Spiriva for a while. Denies chest pain, orthopnea, edema or fever. Still smoking , cessation discussed.   Says she is doing okay with CPAP At bedtime  . Wears CPAP for 6-7 hr each night. Feels rested.       Review of Systems Constitutional:   No  weight loss, night sweats,  Fevers, chills,  +fatigue, or  lassitude.  HEENT:   No headaches,  Difficulty swallowing,  Tooth/dental problems, or  Sore throat,                No sneezing, itching, ear ache, nasal congestion, post nasal drip,   CV:  No chest pain,  Orthopnea, PND, swelling in lower extremities, anasarca, dizziness, palpitations, syncope.   GI  No heartburn, indigestion, abdominal pain, nausea, vomiting, diarrhea, change in bowel habits, loss of appetite, bloody stools.   Resp:   No chest wall deformity  Skin: no rash or lesions.  GU: no dysuria, change in color of urine, no urgency or frequency.  No flank pain, no hematuria   MS:  No joint pain or swelling.  No decreased range of motion.  No back pain.  Psych:  No change in mood or affect. No depression or anxiety.  No memory loss.         Objective:   Physical Exam GEN: A/Ox3; pleasant , NAD, obese  HEENT:  Short/AT,  EACs-clear, TMs-wnl, NOSE-clear, THROAT-clear, no lesions, no postnasal drip or exudate noted.   NECK:  Supple w/ fair ROM; no JVD; normal carotid impulses w/o bruits; no thyromegaly or nodules palpated;  no lymphadenopathy.  RESP  Decreased BS in bases no accessory muscle use, no dullness to percussion  CARD:  RRR, no m/r/g  , tr  peripheral edema, pulses intact, no cyanosis or clubbing.  GI:   Soft & nt; nml bowel sounds; no organomegaly or masses detected.  Musco: Warm bil, no deformities or joint swelling noted.   Neuro: alert, no focal deficits noted.    Skin: Warm, no lesions or rashes         Assessment & Plan:

## 2015-03-26 NOTE — Progress Notes (Signed)
Quick Note:  Called and spoke with pt. Reviewed results and recs. Pt voiced understanding and had no further questions. ______ 

## 2015-03-29 LAB — ALPHA-1 ANTITRYPSIN PHENOTYPE: A-1 Antitrypsin: 176 mg/dL (ref 83–199)

## 2015-03-29 NOTE — Progress Notes (Signed)
Quick Note:  Called and spoke with pt. Explained to her the situation. She stated that she could come Monday to get lab. Pt voiced understanding and had no further questions. ______

## 2015-03-31 NOTE — Assessment & Plan Note (Signed)
Keep up good work with CPAP At bedtime   Work on weight loss  Do not drive if sleepy.    Follow up Dr. Craige CottaSood  In 2 months   Please contact office for sooner follow up if symptoms do not improve or worsen or seek emergency care

## 2015-03-31 NOTE — Assessment & Plan Note (Signed)
Smoking cessation discussed 

## 2015-03-31 NOTE — Assessment & Plan Note (Signed)
Recent flare, now resolving.  Will restart Spiriva and change back to symbicort.  Check alpha 1 today  Set up for PFT on return  Plan  Stop Dulera  Restart Symbicort 2 puffs Twice daily   Restart Spiriva 1 puff daily .  Work on not smoking.  Chest xray and labs today.  Follow up Dr. Craige CottaSood  In 2 months with PFT  Please contact office for sooner follow up if symptoms do not improve or worsen or seek emergency care

## 2015-04-01 ENCOUNTER — Other Ambulatory Visit (INDEPENDENT_AMBULATORY_CARE_PROVIDER_SITE_OTHER): Payer: Managed Care, Other (non HMO)

## 2015-04-01 DIAGNOSIS — J441 Chronic obstructive pulmonary disease with (acute) exacerbation: Secondary | ICD-10-CM | POA: Diagnosis not present

## 2015-04-01 LAB — BRAIN NATRIURETIC PEPTIDE: Pro B Natriuretic peptide (BNP): 22 pg/mL (ref 0.0–100.0)

## 2015-04-02 ENCOUNTER — Telehealth: Payer: Self-pay | Admitting: Adult Health

## 2015-04-02 NOTE — Progress Notes (Signed)
Quick Note:  LVM for pt to return call ______ 

## 2015-04-02 NOTE — Progress Notes (Signed)
Quick Note:  Please see phone note from 11.1.2016. ______

## 2015-04-02 NOTE — Progress Notes (Signed)
Quick Note:  Please see phone note from 11.1.2016. ______ 

## 2015-04-02 NOTE — Telephone Encounter (Addendum)
Called and spoke to receptionist that transferred me to pt, line was then disconnected. ATC pt again, line continued to ring without VM or answer. WCB.    Notes Recorded by Julio Sicksammy S Parrett, NP on 04/02/2015 at 8:44 AM CHF marker is ok  Cont w/ ov recs  Notes Recorded by Julio Sicksammy S Parrett, NP on 03/31/2015 at 10:10 AM Alpha 1 test is ok No sign of genetic cause for COPD

## 2015-04-03 NOTE — Telephone Encounter (Signed)
Spoke with pt and notified of results per Tammy Parrett, NP. Pt verbalized understanding and denied any questions. 

## 2015-04-03 NOTE — Telephone Encounter (Signed)
Pt returning call and can be reached @ [phone].Caren GriffinsStanley A Dalton

## 2015-04-19 ENCOUNTER — Other Ambulatory Visit: Payer: Self-pay | Admitting: Internal Medicine

## 2015-05-29 ENCOUNTER — Other Ambulatory Visit: Payer: Self-pay | Admitting: Internal Medicine

## 2015-06-07 ENCOUNTER — Ambulatory Visit (INDEPENDENT_AMBULATORY_CARE_PROVIDER_SITE_OTHER): Payer: Managed Care, Other (non HMO) | Admitting: Primary Care

## 2015-06-07 ENCOUNTER — Encounter: Payer: Self-pay | Admitting: Primary Care

## 2015-06-07 VITALS — BP 136/84 | HR 86 | Temp 98.3°F | Ht 63.0 in | Wt 284.8 lb

## 2015-06-07 DIAGNOSIS — R062 Wheezing: Secondary | ICD-10-CM | POA: Diagnosis not present

## 2015-06-07 DIAGNOSIS — R05 Cough: Secondary | ICD-10-CM

## 2015-06-07 DIAGNOSIS — R059 Cough, unspecified: Secondary | ICD-10-CM

## 2015-06-07 MED ORDER — ALBUTEROL SULFATE HFA 108 (90 BASE) MCG/ACT IN AERS: 2.0000 | INHALATION_SPRAY | Freq: Four times a day (QID) | RESPIRATORY_TRACT | Status: DC | PRN

## 2015-06-07 MED ORDER — IPRATROPIUM BROMIDE 0.02 % IN SOLN
0.5000 mg | Freq: Once | RESPIRATORY_TRACT | Status: AC
  Administered 2015-06-07: 0.5 mg via RESPIRATORY_TRACT

## 2015-06-07 MED ORDER — ALBUTEROL SULFATE (2.5 MG/3ML) 0.083% IN NEBU
2.5000 mg | INHALATION_SOLUTION | Freq: Once | RESPIRATORY_TRACT | Status: AC
  Administered 2015-06-07: 2.5 mg via RESPIRATORY_TRACT

## 2015-06-07 MED ORDER — HYDROCODONE-HOMATROPINE 5-1.5 MG/5ML PO SYRP: 5.0000 mL | ORAL_SOLUTION | Freq: Three times a day (TID) | ORAL | Status: DC | PRN

## 2015-06-07 MED ORDER — PREDNISONE 20 MG PO TABS: ORAL_TABLET | ORAL | Status: DC

## 2015-06-07 MED ORDER — DOXYCYCLINE HYCLATE 100 MG PO TABS: 100.0000 mg | ORAL_TABLET | Freq: Two times a day (BID) | ORAL | Status: DC

## 2015-06-07 NOTE — Patient Instructions (Addendum)
Start Doxycycline antibiotic. Take 1 tablet by mouth twice daily for 10 days.  Start Prednisone tablets to help loosen up airways. Take 2 tablets by mouth for 3 days, then 1 tablet for 3 days.   I've sent Pro Air albuterol inhaler to [facility]. Inhale 2 puffs every 6 hours as needed for wheezing and shortness of breath.   Start taking Mucinex DM tablets over the counter to help with chest congestion. Ensure you take this with a full glass of water.  You may take the Hycodan cough suppressant every 8 hours as needed for cough and rest. Caution this medication contains codeine and will make you feel drowsy.   Increase consumption of water and rest.  Please go to the emergency department if your wheezing/shortness of breath become worse.  It was a pleasure meeting you!

## 2015-06-07 NOTE — Progress Notes (Signed)
Subjective:    Patient ID: Brenda [name], female    DOB: [birth year], 55 y.o.   MRN: [id number]  HPI  [name] is a 55 year old female who presents today with a chief complaint of shortness of breath and wheezing. She also reports productive cough with yellow/green sputum, fevers, chills, sore throat, body aches.   Her symptoms have been present for 7 days, and became worse this morning with increased wheezing and cough. She's been taking ibuprofen OTC without improvement, no other OTC meds.   She's out of her albuterol and xopenox nebulized treatment and the pharmacy is out of stock until Monday next week. Her insurance would not cover the Ventolin inhaler and is requesting for it to be changed to [employer] Air. Her last breathing treatment was this morning.   Review of Systems  Constitutional: Positive for fever, chills and fatigue.  HENT: Positive for congestion, ear pain, sinus pressure and sore throat.   Respiratory: Positive for cough, shortness of breath and wheezing.   Cardiovascular: Negative for chest pain.  Gastrointestinal: Negative for nausea.       Past Medical History  Diagnosis Date  . Bronchitis, chronic obstructive (HCC) 2008 & 2009    FeV1 64% TLC 105% DLCO 55% 2008  -  FeV1 81% FeF 25-75 43% 2009  . Nonspecific abnormal electrocardiogram (ECG) (EKG)   . Leukocytosis, unspecified   . HTN (hypertension)   . Hx of migraines   . Bipolar I disorder, most recent episode (or current) manic, unspecified     pt denies this  . Pure hyperglyceridemia   . Acute pancreatitis   . COPD (chronic obstructive pulmonary disease) (HCC)     emphysema, not an issue per pulm (10/2013)  . Nicotine addiction   . Asthma     main dyspnea thought due to deconditioning (10/2013)  . Obesity   . Swelling of limb   . Knee pain, right   . Suicide attempt (HCC) 06/2001  . History of pyelonephritis 05/2012  . Lumbar disc disease with radiculopathy     multilevel spondylitic changes, scoliosis,  and anterolisthesis L4 not thought to currently be surg candidate (Dr. Phoebe PerchHirsch, [facility]) (Dr. Ollen BowlHarkins, [facility])  . Urge and stress incontinence 07/2012    (MacDiarmid)    Social History   Social History  . Marital Status: Married    Spouse Name: N/A  . Number of Children: 3  . Years of Education: N/A   Occupational History  . Nursing Secretary   .     Social History Main Topics  . Smoking status: Current Every Day Smoker -- 0.50 packs/day for 30 years    Types: Cigarettes    Last Attempt to Quit: 01/30/2013  . Smokeless tobacco: Never Used  . Alcohol Use: No  . Drug Use: No  . Sexual Activity: Not on file   Other Topics Concern  . Not on file   Social History Narrative   Lives with husband and son and daughter in [place] and grandchild   Occupation: Media plannermedical secretary at [employer]   Activity: limited by dyspnea   Diet: good water, fruits/vegetables daily    Past Surgical History  Procedure Laterality Date  . Lumbar mri  11/2010    multilevel spondylitic changes with upper lumbar scoliosis and anterolisthesis of L4 on 5 with some L foraminal narrowing esp at L/3 with concavity of scoliosis, some central stenosis and biforaminal narrowing at L4/5 with spondylolisthesis  . Cholecystectomy  07/1982  .  Tubal ligation    . Retained stone      ERCP secondary to retained stone  . Right knee surgery  1984,1989,1989,1991,1994    Reconstructions for ACL insuff  . Ercp / sphincterotomy - stenosis  01/15/06  . Ct maxillofacial wo/w cm  06/14/06    Left max mucocele  . Ct of chest  06/14/2006    Normal except c/w active inflammation / infection.  Left renal atrophy  . [place] - pneumonia, acute asthma, left maxillary sinusitis  01/11 - 06/18/2006    Family History  Problem Relation Age of Onset  . Hypertension Mother   . Hyperlipidemia Mother   . Hypertension Father   . Heart disease Brother     MI in early 41's  . Asthma Father   . Breast cancer Paternal  Grandmother     Allergies  Allergen Reactions  . Metolazone     REACTION: metabolic mood swings    Current Outpatient Prescriptions on File Prior to Visit  Medication Sig Dispense Refill  . albuterol (PROVENTIL) (2.5 MG/3ML) 0.083% nebulizer solution USE ONE VIAL IN NEBULIZER EVERY 6 HOURS AS NEEDED FOR WHEEZING FOR SHORTNESS OF BREATH 150 vial 1  . budesonide-formoterol (SYMBICORT) 160-4.5 MCG/ACT inhaler Inhale 2 puffs into the lungs 2 (two) times daily. 1 Inhaler 3  . cetirizine (ZYRTEC) 10 MG tablet Take 10 mg by mouth daily as needed for allergies.    . cyclobenzaprine (FLEXERIL) 5 MG tablet Take 1 tablet (5 mg total) by mouth 3 (three) times daily as needed for muscle spasms. 40 tablet 0  . hydrochlorothiazide (HYDRODIURIL) 25 MG tablet Take 1 tablet (25 mg total) by mouth daily. 90 tablet 3  . levalbuterol (XOPENEX) 1.25 MG/3ML nebulizer solution USE ONE  IN NEBULIZER EVERY 4 HOURS AS NEEDED FOR WHEEZING 72 mL 5  . Respiratory Therapy Supplies (FLUTTER) DEVI Use as directed 1 each 0  . tiotropium (SPIRIVA HANDIHALER) 18 MCG inhalation capsule Place 1 capsule (18 mcg total) into inhaler and inhale daily. 30 capsule 3  . valsartan (DIOVAN) 160 MG tablet TAKE ONE TABLET BY MOUTH ONCE DAILY 30 tablet 0  . mometasone-formoterol (DULERA) 100-5 MCG/ACT AERO Take 2 puffs first thing in am and then another 2 puffs about 12 hours later. (Patient not taking: Reported on 03/25/2015) 1 Inhaler 11   No current facility-administered medications on file prior to visit.    BP 136/84 mmHg  Pulse 86  Temp(Src) 98.3 F (36.8 C) (Oral)  Ht 5\' 3"  (1.6 m)  Wt 284 lb 12.8 oz (129.184 kg)  BMI 50.46 kg/m2  SpO2 94%    Objective:   Physical Exam  Constitutional: She appears well-nourished.  HENT:  Right Ear: Ear canal normal. Tympanic membrane is erythematous. Tympanic membrane is not bulging.  Left Ear: Tympanic membrane is erythematous and bulging.  Nose: Mucosal edema present. Right sinus  exhibits maxillary sinus tenderness. Right sinus exhibits no frontal sinus tenderness. Left sinus exhibits maxillary sinus tenderness. Left sinus exhibits no frontal sinus tenderness.  Mouth/Throat: Posterior oropharyngeal erythema present. No oropharyngeal exudate or posterior oropharyngeal edema.  Neck: Neck supple.  Cardiovascular: Normal rate and regular rhythm.   Pulmonary/Chest: Tachypnea noted. No respiratory distress. She has no decreased breath sounds. She has wheezes in the right upper field, the right lower field, the left upper field and the left lower field. She has rhonchi in the right upper field, the right lower field, the left upper field and the left lower field.  Slight increase  in effort, no distress.  Lymphadenopathy:    She has no cervical adenopathy.  Skin: Skin is warm and dry.          Assessment & Plan:  Acute Bronchitis:  Wheezing, shortness of breath, productive cough. Symptoms present for 1 week, worse today with SOB. Changed Ventolin to [employer] due to insurance coverage. Audible wheezing upon exam today, rhonchi throughout, mild tachypnea. Left TM with erythema and bulging.  Duoneb provided in office today. Oxygen saturation improved to 95% on room air. Stable in office, suitable for home treatment at this point. Start Doxycycline 10 day course, prednisone, and Pro Air inhaler. Mucinex DM and Hycodan PRN. Fluids, rest, ER precautions provided.

## 2015-06-07 NOTE — Addendum Note (Signed)
Addended by: Tawnya CrookSAMBATH, Kallista Pae on: 06/07/2015 02:33 PM   Modules accepted: Orders

## 2015-06-10 ENCOUNTER — Encounter: Payer: Self-pay | Admitting: Primary Care

## 2015-06-11 ENCOUNTER — Telehealth: Payer: Self-pay | Admitting: *Deleted

## 2015-06-11 MED ORDER — ALBUTEROL SULFATE 108 (90 BASE) MCG/ACT IN AEPB: 2.0000 | INHALATION_SPRAY | Freq: Four times a day (QID) | RESPIRATORY_TRACT | Status: DC | PRN

## 2015-06-11 NOTE — Telephone Encounter (Signed)
Ok to change to [employer]air? Patient's insurance now covers that. If ok to change, please send into [facility]. Thanks!

## 2015-06-11 NOTE — Telephone Encounter (Signed)
Sent proair in.

## 2015-06-12 ENCOUNTER — Encounter: Payer: Self-pay | Admitting: Primary Care

## 2015-07-02 ENCOUNTER — Other Ambulatory Visit: Payer: Self-pay | Admitting: Family Medicine

## 2015-07-05 ENCOUNTER — Encounter: Payer: Self-pay | Admitting: Family Medicine

## 2015-07-05 MED ORDER — VALSARTAN 160 MG PO TABS: 160.0000 mg | ORAL_TABLET | Freq: Every day | ORAL | Status: DC

## 2015-07-11 ENCOUNTER — Ambulatory Visit: Payer: Managed Care, Other (non HMO) | Admitting: Pulmonary Disease

## 2015-07-20 ENCOUNTER — Encounter (HOSPITAL_COMMUNITY): Payer: Self-pay | Admitting: Nurse Practitioner

## 2015-07-20 ENCOUNTER — Emergency Department (HOSPITAL_COMMUNITY): Payer: Managed Care, Other (non HMO)

## 2015-07-20 ENCOUNTER — Inpatient Hospital Stay (HOSPITAL_COMMUNITY)
Admission: EM | Admit: 2015-07-20 | Discharge: 2015-07-26 | DRG: 190 | Disposition: A | Discharge status: Home / Self Care | Payer: Managed Care, Other (non HMO) | Attending: Internal Medicine | Admitting: Internal Medicine

## 2015-07-20 DIAGNOSIS — T380X5A Adverse effect of glucocorticoids and synthetic analogues, initial encounter: Secondary | ICD-10-CM | POA: Diagnosis present

## 2015-07-20 DIAGNOSIS — Z7951 Long term (current) use of inhaled steroids: Secondary | ICD-10-CM | POA: Diagnosis not present

## 2015-07-20 DIAGNOSIS — J9690 Respiratory failure, unspecified, unspecified whether with hypoxia or hypercapnia: Secondary | ICD-10-CM

## 2015-07-20 DIAGNOSIS — J441 Chronic obstructive pulmonary disease with (acute) exacerbation: Secondary | ICD-10-CM | POA: Diagnosis not present

## 2015-07-20 DIAGNOSIS — Z825 Family history of asthma and other chronic lower respiratory diseases: Secondary | ICD-10-CM

## 2015-07-20 DIAGNOSIS — F319 Bipolar disorder, unspecified: Secondary | ICD-10-CM | POA: Diagnosis present

## 2015-07-20 DIAGNOSIS — J45909 Unspecified asthma, uncomplicated: Secondary | ICD-10-CM | POA: Diagnosis present

## 2015-07-20 DIAGNOSIS — Z8249 Family history of ischemic heart disease and other diseases of the circulatory system: Secondary | ICD-10-CM | POA: Diagnosis not present

## 2015-07-20 DIAGNOSIS — R0603 Acute respiratory distress: Secondary | ICD-10-CM | POA: Insufficient documentation

## 2015-07-20 DIAGNOSIS — Z79899 Other long term (current) drug therapy: Secondary | ICD-10-CM

## 2015-07-20 DIAGNOSIS — I10 Essential (primary) hypertension: Secondary | ICD-10-CM | POA: Diagnosis present

## 2015-07-20 DIAGNOSIS — E781 Pure hyperglyceridemia: Secondary | ICD-10-CM | POA: Diagnosis present

## 2015-07-20 DIAGNOSIS — E875 Hyperkalemia: Secondary | ICD-10-CM | POA: Diagnosis present

## 2015-07-20 DIAGNOSIS — Z9989 Dependence on other enabling machines and devices: Secondary | ICD-10-CM | POA: Diagnosis present

## 2015-07-20 DIAGNOSIS — E1142 Type 2 diabetes mellitus with diabetic polyneuropathy: Secondary | ICD-10-CM | POA: Diagnosis present

## 2015-07-20 DIAGNOSIS — J9601 Acute respiratory failure with hypoxia: Secondary | ICD-10-CM | POA: Diagnosis not present

## 2015-07-20 DIAGNOSIS — J4520 Mild intermittent asthma, uncomplicated: Secondary | ICD-10-CM | POA: Diagnosis not present

## 2015-07-20 DIAGNOSIS — J80 Acute respiratory distress syndrome: Secondary | ICD-10-CM | POA: Diagnosis not present

## 2015-07-20 DIAGNOSIS — E1169 Type 2 diabetes mellitus with other specified complication: Secondary | ICD-10-CM | POA: Diagnosis present

## 2015-07-20 DIAGNOSIS — J45901 Unspecified asthma with (acute) exacerbation: Secondary | ICD-10-CM | POA: Diagnosis present

## 2015-07-20 DIAGNOSIS — F4322 Adjustment disorder with anxiety: Secondary | ICD-10-CM | POA: Diagnosis present

## 2015-07-20 DIAGNOSIS — R609 Edema, unspecified: Secondary | ICD-10-CM | POA: Diagnosis not present

## 2015-07-20 DIAGNOSIS — F17210 Nicotine dependence, cigarettes, uncomplicated: Secondary | ICD-10-CM | POA: Diagnosis present

## 2015-07-20 DIAGNOSIS — E1165 Type 2 diabetes mellitus with hyperglycemia: Secondary | ICD-10-CM | POA: Diagnosis present

## 2015-07-20 DIAGNOSIS — E119 Type 2 diabetes mellitus without complications: Secondary | ICD-10-CM | POA: Diagnosis present

## 2015-07-20 DIAGNOSIS — Z888 Allergy status to other drugs, medicaments and biological substances status: Secondary | ICD-10-CM | POA: Diagnosis not present

## 2015-07-20 DIAGNOSIS — Z6841 Body Mass Index (BMI) 40.0 and over, adult: Secondary | ICD-10-CM

## 2015-07-20 DIAGNOSIS — J4541 Moderate persistent asthma with (acute) exacerbation: Secondary | ICD-10-CM | POA: Diagnosis not present

## 2015-07-20 DIAGNOSIS — R739 Hyperglycemia, unspecified: Secondary | ICD-10-CM | POA: Diagnosis present

## 2015-07-20 DIAGNOSIS — G4733 Obstructive sleep apnea (adult) (pediatric): Secondary | ICD-10-CM | POA: Diagnosis not present

## 2015-07-20 DIAGNOSIS — F3110 Bipolar disorder, current episode manic without psychotic features, unspecified: Secondary | ICD-10-CM | POA: Diagnosis not present

## 2015-07-20 DIAGNOSIS — F39 Unspecified mood [affective] disorder: Secondary | ICD-10-CM | POA: Diagnosis present

## 2015-07-20 LAB — CBC WITH DIFFERENTIAL/PLATELET
Basophils Absolute: 0 10*3/uL (ref 0.0–0.1)
Basophils Relative: 0 %
Eosinophils Absolute: 0.1 10*3/uL (ref 0.0–0.7)
Eosinophils Relative: 1 %
HCT: 41.9 % (ref 36.0–46.0)
Hemoglobin: 13.5 g/dL (ref 12.0–15.0)
Lymphocytes Relative: 23 %
Lymphs Abs: 1.6 10*3/uL (ref 0.7–4.0)
MCH: 29.2 pg (ref 26.0–34.0)
MCHC: 32.2 g/dL (ref 30.0–36.0)
MCV: 90.7 fL (ref 78.0–100.0)
Monocytes Absolute: 0.3 10*3/uL (ref 0.1–1.0)
Monocytes Relative: 5 %
Neutro Abs: 4.7 10*3/uL (ref 1.7–7.7)
Neutrophils Relative %: 71 %
Platelets: 225 10*3/uL (ref 150–400)
RBC: 4.62 MIL/uL (ref 3.87–5.11)
RDW: 14.8 % (ref 11.5–15.5)
WBC: 6.7 10*3/uL (ref 4.0–10.5)

## 2015-07-20 LAB — COMPREHENSIVE METABOLIC PANEL
ALT: 27 U/L (ref 14–54)
AST: 30 U/L (ref 15–41)
Albumin: 3.1 g/dL — ABNORMAL LOW (ref 3.5–5.0)
Alkaline Phosphatase: 94 U/L (ref 38–126)
Anion gap: 12 (ref 5–15)
BUN: 12 mg/dL (ref 6–20)
CO2: 27 mmol/L (ref 22–32)
Calcium: 8.9 mg/dL (ref 8.9–10.3)
Chloride: 101 mmol/L (ref 101–111)
Creatinine, Ser: 0.98 mg/dL (ref 0.44–1.00)
GFR calc Af Amer: >60 mL/min (ref >60)
GFR calc non Af Amer: >60 mL/min (ref >60)
Glucose, Bld: 117 mg/dL — ABNORMAL HIGH (ref 65–99)
Potassium: 4.1 mmol/L (ref 3.5–5.1)
Sodium: 140 mmol/L (ref 135–145)
Total Bilirubin: 0.2 mg/dL — ABNORMAL LOW (ref 0.3–1.2)
Total Protein: 6.6 g/dL (ref 6.5–8.1)

## 2015-07-20 LAB — I-STAT TROPONIN, ED: Troponin i, poc: 0 ng/mL (ref 0.00–0.08)

## 2015-07-20 LAB — BRAIN NATRIURETIC PEPTIDE: B Natriuretic Peptide: 15.4 pg/mL (ref 0.0–100.0)

## 2015-07-20 MED ORDER — ENSURE ENLIVE PO LIQD
237.0000 mL | Freq: Two times a day (BID) | ORAL | Status: DC
  Administered 2015-07-21 – 2015-07-26 (×6): 237 mL via ORAL

## 2015-07-20 MED ORDER — IRBESARTAN 150 MG PO TABS: 150.0000 mg | ORAL_TABLET | Freq: Every day | ORAL | Status: DC

## 2015-07-20 MED ORDER — CETYLPYRIDINIUM CHLORIDE 0.05 % MT LIQD
7.0000 mL | Freq: Two times a day (BID) | OROMUCOSAL | Status: DC
Start: 2015-07-20 — End: 2015-07-26
  Administered 2015-07-20 – 2015-07-26 (×10): 7 mL via OROMUCOSAL

## 2015-07-20 MED ORDER — ENOXAPARIN SODIUM 40 MG/0.4ML ~~LOC~~ SOLN
40.0000 mg | SUBCUTANEOUS | Status: DC
  Administered 2015-07-20 – 2015-07-25 (×6): 40 mg via SUBCUTANEOUS
  Filled 2015-07-20 (×6): qty 0.4

## 2015-07-20 MED ORDER — IPRATROPIUM-ALBUTEROL 0.5-2.5 (3) MG/3ML IN SOLN
3.0000 mL | Freq: Once | RESPIRATORY_TRACT | Status: AC
  Administered 2015-07-20: 3 mL via RESPIRATORY_TRACT
  Filled 2015-07-20: qty 3

## 2015-07-20 MED ORDER — LORAZEPAM 2 MG/ML IJ SOLN
0.5000 mg | Freq: Once | INTRAMUSCULAR | Status: AC
Start: 2015-07-20 — End: 2015-07-20
  Administered 2015-07-20: 0.5 mg via INTRAVENOUS
  Filled 2015-07-20: qty 1

## 2015-07-20 MED ORDER — ALBUTEROL SULFATE (2.5 MG/3ML) 0.083% IN NEBU
2.5000 mg | INHALATION_SOLUTION | RESPIRATORY_TRACT | Status: DC | PRN
  Administered 2015-07-21 – 2015-07-22 (×2): 2.5 mg via RESPIRATORY_TRACT
  Filled 2015-07-20 (×2): qty 3

## 2015-07-20 MED ORDER — AZITHROMYCIN 250 MG PO TABS
500.0000 mg | ORAL_TABLET | Freq: Every day | ORAL | Status: AC
  Administered 2015-07-20: 500 mg via ORAL
  Filled 2015-07-20: qty 2

## 2015-07-20 MED ORDER — ALBUTEROL SULFATE (2.5 MG/3ML) 0.083% IN NEBU
5.0000 mg | INHALATION_SOLUTION | Freq: Once | RESPIRATORY_TRACT | Status: AC
  Administered 2015-07-20: 5 mg via RESPIRATORY_TRACT
  Filled 2015-07-20: qty 6

## 2015-07-20 MED ORDER — GUAIFENESIN ER 600 MG PO TB12
600.0000 mg | ORAL_TABLET | Freq: Two times a day (BID) | ORAL | Status: DC
  Administered 2015-07-20 – 2015-07-26 (×12): 600 mg via ORAL
  Filled 2015-07-20 (×12): qty 1

## 2015-07-20 MED ORDER — ENOXAPARIN SODIUM 40 MG/0.4ML ~~LOC~~ SOLN: 40.0000 mg | SUBCUTANEOUS | Status: DC

## 2015-07-20 MED ORDER — ONDANSETRON HCL 4 MG/2ML IJ SOLN: 4.0000 mg | Freq: Four times a day (QID) | INTRAMUSCULAR | Status: DC | PRN

## 2015-07-20 MED ORDER — GUAIFENESIN ER 600 MG PO TB12: 600.0000 mg | ORAL_TABLET | Freq: Two times a day (BID) | ORAL | Status: DC

## 2015-07-20 MED ORDER — ACETAMINOPHEN 325 MG PO TABS
650.0000 mg | ORAL_TABLET | Freq: Four times a day (QID) | ORAL | Status: DC | PRN
  Administered 2015-07-21: 650 mg via ORAL
  Filled 2015-07-20: qty 2

## 2015-07-20 MED ORDER — METHYLPREDNISOLONE SODIUM SUCC 125 MG IJ SOLR
60.0000 mg | INTRAMUSCULAR | Status: DC
  Administered 2015-07-20 – 2015-07-21 (×7): 60 mg via INTRAVENOUS
  Filled 2015-07-20 (×7): qty 2

## 2015-07-20 MED ORDER — ALBUTEROL SULFATE (2.5 MG/3ML) 0.083% IN NEBU
2.5000 mg | INHALATION_SOLUTION | RESPIRATORY_TRACT | Status: DC
  Administered 2015-07-20 – 2015-07-21 (×6): 2.5 mg via RESPIRATORY_TRACT
  Filled 2015-07-20 (×7): qty 3

## 2015-07-20 MED ORDER — BUDESONIDE-FORMOTEROL FUMARATE 160-4.5 MCG/ACT IN AERO: 2.0000 | INHALATION_SPRAY | Freq: Two times a day (BID) | RESPIRATORY_TRACT | Status: DC

## 2015-07-20 MED ORDER — DEXTROMETHORPHAN POLISTIREX ER 30 MG/5ML PO SUER: 15.0000 mg | Freq: Two times a day (BID) | ORAL | Status: DC

## 2015-07-20 MED ORDER — ONDANSETRON HCL 4 MG PO TABS: 4.0000 mg | ORAL_TABLET | Freq: Four times a day (QID) | ORAL | Status: DC | PRN

## 2015-07-20 MED ORDER — AZITHROMYCIN 250 MG PO TABS
250.0000 mg | ORAL_TABLET | Freq: Every day | ORAL | Status: AC
  Administered 2015-07-21 – 2015-07-24 (×4): 250 mg via ORAL
  Filled 2015-07-20 (×4): qty 1

## 2015-07-20 MED ORDER — IRBESARTAN 150 MG PO TABS
150.0000 mg | ORAL_TABLET | Freq: Every day | ORAL | Status: DC
  Administered 2015-07-21: 150 mg via ORAL
  Filled 2015-07-20: qty 1

## 2015-07-20 MED ORDER — POLYETHYLENE GLYCOL 3350 17 G PO PACK: 17.0000 g | PACK | Freq: Every day | ORAL | Status: DC | PRN

## 2015-07-20 MED ORDER — PNEUMOCOCCAL VAC POLYVALENT 25 MCG/0.5ML IJ INJ
0.5000 mL | INJECTION | INTRAMUSCULAR | Status: AC
  Administered 2015-07-24: 0.5 mL via INTRAMUSCULAR
  Filled 2015-07-20: qty 0.5

## 2015-07-20 MED ORDER — SODIUM CHLORIDE 0.9 % IV SOLN
INTRAVENOUS | Status: DC
  Administered 2015-07-20: 17:00:00 via INTRAVENOUS

## 2015-07-20 MED ORDER — METHYLPREDNISOLONE SODIUM SUCC 125 MG IJ SOLR
125.0000 mg | Freq: Once | INTRAMUSCULAR | Status: AC
  Administered 2015-07-20: 125 mg via INTRAVENOUS
  Filled 2015-07-20: qty 2

## 2015-07-20 MED ORDER — ACETAMINOPHEN 650 MG RE SUPP: 650.0000 mg | Freq: Four times a day (QID) | RECTAL | Status: DC | PRN

## 2015-07-20 MED ORDER — MORPHINE SULFATE (PF) 2 MG/ML IV SOLN
1.0000 mg | INTRAVENOUS | Status: DC | PRN
  Administered 2015-07-21 – 2015-07-25 (×7): 1 mg via INTRAVENOUS
  Filled 2015-07-20 (×7): qty 1

## 2015-07-20 MED ORDER — BUDESONIDE-FORMOTEROL FUMARATE 160-4.5 MCG/ACT IN AERO
2.0000 | INHALATION_SPRAY | Freq: Two times a day (BID) | RESPIRATORY_TRACT | Status: DC
  Administered 2015-07-20 – 2015-07-21 (×2): 2 via RESPIRATORY_TRACT
  Filled 2015-07-20: qty 6

## 2015-07-20 MED ORDER — DEXTROMETHORPHAN POLISTIREX ER 30 MG/5ML PO SUER
15.0000 mg | Freq: Two times a day (BID) | ORAL | Status: DC
  Administered 2015-07-20 – 2015-07-26 (×12): 15 mg via ORAL
  Filled 2015-07-20 (×14): qty 5

## 2015-07-20 MED ORDER — ALBUTEROL SULFATE (2.5 MG/3ML) 0.083% IN NEBU
2.5000 mg | INHALATION_SOLUTION | Freq: Once | RESPIRATORY_TRACT | Status: AC
  Administered 2015-07-20: 2.5 mg via RESPIRATORY_TRACT
  Filled 2015-07-20: qty 3

## 2015-07-20 MED ORDER — LORATADINE 10 MG PO TABS
10.0000 mg | ORAL_TABLET | Freq: Every day | ORAL | Status: DC
  Administered 2015-07-20 – 2015-07-26 (×7): 10 mg via ORAL
  Filled 2015-07-20 (×7): qty 1

## 2015-07-20 NOTE — ED Provider Notes (Signed)
CSN: [id number]     Arrival date & time 07/20/15  1110 History   First MD Initiated Contact with Patient 07/20/15 1120     Chief Complaint  Patient presents with  . Shortness of Breath     (Consider location/radiation/quality/duration/timing/severity/associated sxs/prior Treatment) Patient is a 55 y.o. female presenting with shortness of breath. The history is provided by the patient (A skin planes of 2 days of cough congestion shortness of breath wheezing).  Shortness of Breath Severity:  Moderate Onset quality:  Sudden Timing:  Constant Progression:  Worsening Chronicity:  New Context: activity   Associated symptoms: wheezing   Associated symptoms: no abdominal pain, no chest pain, no cough, no headaches and no rash     Past Medical History  Diagnosis Date  . Bronchitis, chronic obstructive (HCC) 2008 & 2009    FeV1 64% TLC 105% DLCO 55% 2008  -  FeV1 81% FeF 25-75 43% 2009  . Nonspecific abnormal electrocardiogram (ECG) (EKG)   . Leukocytosis, unspecified   . HTN (hypertension)   . Hx of migraines   . Bipolar I disorder, most recent episode (or current) manic, unspecified     pt denies this  . Pure hyperglyceridemia   . Acute pancreatitis   . COPD (chronic obstructive pulmonary disease) (HCC)     emphysema, not an issue per pulm (10/2013)  . Nicotine addiction   . Asthma     main dyspnea thought due to deconditioning (10/2013)  . Obesity   . Swelling of limb   . Knee pain, right   . Suicide attempt (HCC) 06/2001  . History of pyelonephritis 05/2012  . Lumbar disc disease with radiculopathy     multilevel spondylitic changes, scoliosis, and anterolisthesis L4 not thought to currently be surg candidate (Dr. Phoebe Perch, [facility]) (Dr. Ollen Bowl, [address])  . Urge and stress incontinence 07/2012    (MacDiarmid)   Past Surgical History  Procedure Laterality Date  . Lumbar mri  11/2010    multilevel spondylitic changes with upper lumbar scoliosis and anterolisthesis of L4 on 5 with  some L foraminal narrowing esp at L/3 with concavity of scoliosis, some central stenosis and biforaminal narrowing at L4/5 with spondylolisthesis  . Cholecystectomy  07/1982  . Tubal ligation    . Retained stone      ERCP secondary to retained stone  . Right knee surgery  1984,1989,1989,1991,1994    Reconstructions for ACL insuff  . Ercp / sphincterotomy - stenosis  01/15/06  . Ct maxillofacial wo/w cm  06/14/06    Left max mucocele  . Ct of chest  06/14/2006    Normal except c/w active inflammation / infection.  Left renal atrophy  . [place] - pneumonia, acute asthma, left maxillary sinusitis  01/11 - 06/18/2006   Family History  Problem Relation Age of Onset  . Hypertension Mother   . Hyperlipidemia Mother   . Hypertension Father   . Heart disease Brother     MI in early 52's  . Asthma Father   . Breast cancer Paternal Grandmother    Social History  Substance Use Topics  . Smoking status: Current Every Day Smoker -- 0.50 packs/day for 30 years    Types: Cigarettes    Last Attempt to Quit: 01/30/2013  . Smokeless tobacco: Never Used  . Alcohol Use: No   OB History    No data available     Review of Systems  Constitutional: Negative for appetite change and fatigue.  HENT: Negative for congestion, ear  discharge and sinus pressure.   Eyes: Negative for discharge.  Respiratory: Positive for shortness of breath and wheezing. Negative for cough.   Cardiovascular: Negative for chest pain.  Gastrointestinal: Negative for abdominal pain and diarrhea.  Genitourinary: Negative for frequency and hematuria.  Musculoskeletal: Negative for back pain.  Skin: Negative for rash.  Neurological: Negative for seizures and headaches.  Psychiatric/Behavioral: Negative for hallucinations.      Allergies  Metolazone  Home Medications   Prior to Admission medications   Medication Sig Start Date End Date Taking? Authorizing Provider  albuterol (PROVENTIL) (2.5 MG/3ML) 0.083%  nebulizer solution INHALE 1 VIAL VIA NEBULIZER EVERY 6 HOURS AS NEEDED FOR WHEEZING/SHORT BREATH 07/02/15  Yes [name], MD  Albuterol Sulfate (PROAIR RESPICLICK) 108 (90 Base) MCG/ACT AEPB Inhale 2 puffs into the lungs every 6 (six) hours as needed. Dyspnea/wheeze 06/11/15  Yes [name], MD  budesonide-formoterol [facility]) 160-4.5 MCG/ACT inhaler Inhale 2 puffs into the lungs 2 (two) times daily. 03/25/15  Yes Tammy S Parrett, NP  cetirizine (ZYRTEC) 10 MG tablet Take 10 mg by mouth daily as needed for allergies.   Yes Historical Provider, MD  hydrochlorothiazide (HYDRODIURIL) 25 MG tablet Take 1 tablet (25 mg total) by mouth daily. 11/09/14  Yes [name], MD  levalbuterol (XOPENEX) 1.25 MG/3ML nebulizer solution USE ONE  IN NEBULIZER EVERY 4 HOURS AS NEEDED FOR WHEEZING 11/22/14  Yes Nyoka Cowden, MD  tiotropium (SPIRIVA HANDIHALER) 18 MCG inhalation capsule Place 1 capsule (18 mcg total) into inhaler and inhale daily. 03/25/15  Yes Tammy S Parrett, NP  valsartan (DIOVAN) 160 MG tablet Take 1 tablet (160 mg total) by mouth daily. 07/05/15  Yes [name], MD   BP 132/79 mmHg  Pulse 74  Temp(Src) 98.6 F (37 C) (Oral)  Resp 21  SpO2 94% Physical Exam  Constitutional: She is oriented to person, place, and time. She appears well-developed.  HENT:  Head: Normocephalic.  Eyes: Conjunctivae and EOM are normal. No scleral icterus.  Neck: Neck supple. No thyromegaly present.  Cardiovascular: Normal rate and regular rhythm.  Exam reveals no gallop and no friction rub.   No murmur heard. Pulmonary/Chest: No stridor. She has wheezes. She has no rales. She exhibits no tenderness.  Abdominal: She exhibits no distension. There is no tenderness. There is no rebound.  Musculoskeletal: Normal range of motion. She exhibits no edema.  Lymphadenopathy:    She has no cervical adenopathy.  Neurological: She is oriented to person, place, and time. She exhibits normal muscle tone.  Coordination normal.  Skin: No rash noted. No erythema.  Psychiatric: She has a normal mood and affect. Her behavior is normal.    ED Course  Procedures (including critical care time) Labs Review Labs Reviewed  COMPREHENSIVE METABOLIC PANEL - Abnormal; Notable for the following:    Glucose, Bld 117 (*)    Albumin 3.1 (*)    Total Bilirubin 0.2 (*)    All other components within normal limits  CBC WITH DIFFERENTIAL/PLATELET  BRAIN NATRIURETIC PEPTIDE  I-STAT TROPOININ, ED    Imaging Review Dg Chest Port 1 View  07/20/2015  CLINICAL DATA:  Shortness of breath EXAM: PORTABLE CHEST 1 VIEW COMPARISON:  03/25/15 chest radiograph. FINDINGS: Stable cardiomediastinal silhouette with normal heart size. No pneumothorax. No pleural effusion. Lungs appear clear, with no acute consolidative airspace disease and no pulmonary edema. IMPRESSION: No active disease. Electronically Signed   By: Delbert Phenix M.D.   On: 07/20/2015 12:58   I have personally  reviewed and evaluated these images and lab results as part of my medical decision-making.   EKG Interpretation   Date/Time:  Saturday July 20 2015 11:21:07 EST Ventricular Rate:  92 PR Interval:  128 QRS Duration: 78 QT Interval:  346 QTC Calculation: 427 R Axis:   -5 Text Interpretation:  Normal sinus rhythm Low voltage QRS Septal infarct ,  age undetermined Abnormal ECG Confirmed by Marquitta Persichetti  MD, Jomarie Longs [id number]) on  07/20/2015 3:46:46 PM      MDM   Final diagnoses:  None    Patient will be admitted for asthma bronchitis exacerbation    Bethann Berkshire, MD 07/20/15 1551

## 2015-07-20 NOTE — ED Notes (Signed)
Pt c/o 2 day history of fevers, productive cough, body aches, sob. Audible wheezing with labored breathing in triage. She has been using her nebs at home with no relief

## 2015-07-20 NOTE — ED Notes (Signed)
Admitting, Toya Smothers, NP at the bedside

## 2015-07-20 NOTE — H&P (Signed)
[facility] History and Physical  Brenda [name] JXB:[id number] DOB: 08/20/1960 DOA: 07/20/2015  Referring physician: zammit PCP: [name], MD   Chief Complaint: dyspnea  HPI: Brenda [name] is a 55 y.o. female with past medical history that includes chronic obstructive bronchitis, hypertension, obesity, bipolar disorder, presents to the emergency department with the chief complaint of persistent worsening shortness of breath. Evaluation in the emergency department reveals acute respiratory distress mild hypoxia due to COPD exacerbation.  Information obtained from the patient reports a 3 day history of gradual persistent worsening of shortness of breath and cough. She reports productive cough with thick white sputum. She states her home nebulizer and inhalers did not help. Associated symptoms include chills with subjective fever chest pain with coughing. She does not wear oxygen at home. She also reports she had an appointment with her pulmonologist last week had to cancel.  In the emergency department he is afebrile hemodynamically stable with an oxygen saturation level of 86% on room air after continuous nebs and Solu-Medrol.   Review of Systems:  10 point review of systems complete and all systems are negative except as indicated in the history of present illness Past Medical History  Diagnosis Date  . Bronchitis, chronic obstructive (HCC) 2008 & 2009    FeV1 64% TLC 105% DLCO 55% 2008  -  FeV1 81% FeF 25-75 43% 2009  . Nonspecific abnormal electrocardiogram (ECG) (EKG)   . Leukocytosis, unspecified   . HTN (hypertension)   . Hx of migraines   . Bipolar I disorder, most recent episode (or current) manic, unspecified     pt denies this  . Pure hyperglyceridemia   . Acute pancreatitis   . COPD (chronic obstructive pulmonary disease) (HCC)     emphysema, not an issue per pulm (10/2013)  . Nicotine addiction   . Asthma     main dyspnea thought due to deconditioning  (10/2013)  . Obesity   . Swelling of limb   . Knee pain, right   . Suicide attempt (HCC) 06/2001  . History of pyelonephritis 05/2012  . Lumbar disc disease with radiculopathy     multilevel spondylitic changes, scoliosis, and anterolisthesis L4 not thought to currently be surg candidate (Dr. Phoebe Perch, [facility]) (Dr. Ollen Bowl, [place])  . Urge and stress incontinence 07/2012    ([facility])   Past Surgical History  Procedure Laterality Date  . Lumbar mri  11/2010    multilevel spondylitic changes with upper lumbar scoliosis and anterolisthesis of L4 on 5 with some L foraminal narrowing esp at L/3 with concavity of scoliosis, some central stenosis and biforaminal narrowing at L4/5 with spondylolisthesis  . Cholecystectomy  07/1982  . Tubal ligation    . Retained stone      ERCP secondary to retained stone  . Right knee surgery  1984,1989,1989,1991,1994    Reconstructions for ACL insuff  . Ercp / sphincterotomy - stenosis  01/15/06  . Ct maxillofacial wo/w cm  06/14/06    Left max mucocele  . Ct of chest  06/14/2006    Normal except c/w active inflammation / infection.  Left renal atrophy  . [place] - pneumonia, acute asthma, left maxillary sinusitis  01/11 - 06/18/2006   Social History:  reports that she has been smoking Cigarettes.  She has a 15 pack-year smoking history. She has never used smokeless tobacco. She reports that she does not drink alcohol or use illicit drugs.  Allergies  Allergen Reactions  . Metolazone  REACTION: metabolic mood swings    Family History  Problem Relation Age of Onset  . Hypertension Mother   . Hyperlipidemia Mother   . Hypertension Father   . Heart disease Brother     MI in early 10's  . Asthma Father   . Breast cancer Paternal Grandmother      Prior to Admission medications   Medication Sig Start Date End Date Taking? Authorizing Provider  albuterol (PROVENTIL) (2.5 MG/3ML) 0.083% nebulizer solution INHALE 1 VIAL VIA NEBULIZER EVERY 6 HOURS  AS NEEDED FOR WHEEZING/SHORT BREATH 07/02/15  Yes [name], MD  Albuterol Sulfate (PROAIR RESPICLICK) 108 (90 Base) MCG/ACT AEPB Inhale 2 puffs into the lungs every 6 (six) hours as needed. Dyspnea/wheeze 06/11/15  Yes [name], MD  budesonide-formoterol [facility]) 160-4.5 MCG/ACT inhaler Inhale 2 puffs into the lungs 2 (two) times daily. 03/25/15  Yes Tammy S Parrett, NP  cetirizine (ZYRTEC) 10 MG tablet Take 10 mg by mouth daily as needed for allergies.   Yes Historical Provider, MD  hydrochlorothiazide (HYDRODIURIL) 25 MG tablet Take 1 tablet (25 mg total) by mouth daily. 11/09/14  Yes [name], MD  levalbuterol (XOPENEX) 1.25 MG/3ML nebulizer solution USE ONE  IN NEBULIZER EVERY 4 HOURS AS NEEDED FOR WHEEZING 11/22/14  Yes Nyoka Cowden, MD  tiotropium (SPIRIVA HANDIHALER) 18 MCG inhalation capsule Place 1 capsule (18 mcg total) into inhaler and inhale daily. 03/25/15  Yes Tammy S Parrett, NP  valsartan (DIOVAN) 160 MG tablet Take 1 tablet (160 mg total) by mouth daily. 07/05/15  Yes [name], MD   Physical Exam: Filed Vitals:   07/20/15 1500 07/20/15 1544 07/20/15 1559 07/20/15 1700  BP: 132/79 135/72  133/72  Pulse: 74 82  92  Temp:      TempSrc:      Resp: Height:    (1.6 m)   Weight:   128.822 kg (284 lb)   SpO2: 94% 95%  86%    Wt Readings from Last 3 Encounters:  07/20/15 128.822 kg (284 lb)  06/07/15 129.184 kg (284 lb 12.8 oz)  03/25/15 130.636 kg (288 lb)    General:  Appear somewhat anxious slightly uncomfortable Eyes: PERRL, normal lids, irises & conjunctiva ENT: grossly normal hearing, lips & tongue Neck: no LAD, masses or thyromegaly Cardiovascular: RRR, no m/r/g. No LE edema.  Respiratory: Admit to severe increased work of breathing. Breath sounds are quite diminished audible wheezing throughout no crackles Abdomen: soft, ntnd obese soft Skin: no rash or induration seen on limited exam Musculoskeletal: grossly normal  tone BUE/BLE Psychiatric: grossly normal mood and affect, speech fluent and appropriate Neurologic: grossly non-focal. speech clear facial symmetry           Labs on Admission:  Basic Metabolic Panel:  Recent Labs Lab 07/20/15 1412  NA 140  K 4.1  CL 101  CO2 27  GLUCOSE 117*  BUN 12  CREATININE 0.98  CALCIUM 8.9   Liver Function Tests:  Recent Labs Lab 07/20/15 1412  AST 30  ALT 27  ALKPHOS 94  BILITOT 0.2*  PROT 6.6  ALBUMIN 3.1*   No results for input(s): LIPASE, AMYLASE in the last 168 hours. No results for input(s): AMMONIA in the last 168 hours. CBC:  Recent Labs Lab 07/20/15 1412  WBC 6.7  NEUTROABS 4.7  HGB 13.5  HCT 41.9  MCV 90.7  PLT 225   Cardiac Enzymes: No results for input(s): CKTOTAL, CKMB, CKMBINDEX, TROPONINI in the  last 168 hours.  BNP (last 3 results)  Recent Labs  07/20/15 1412  BNP 15.4    ProBNP (last 3 results)  Recent Labs  04/01/15 1645  PROBNP 22.0    CBG: No results for input(s): GLUCAP in the last 168 hours.  Radiological Exams on Admission: Dg Chest Port 1 View  07/20/2015  CLINICAL DATA:  Shortness of breath EXAM: PORTABLE CHEST 1 VIEW COMPARISON:  03/25/15 chest radiograph. FINDINGS: Stable cardiomediastinal silhouette with normal heart size. No pneumothorax. No pleural effusion. Lungs appear clear, with no acute consolidative airspace disease and no pulmonary edema. IMPRESSION: No active disease. Electronically Signed   By: Delbert Phenix M.D.   On: 07/20/2015 12:58    EKG: Independently reviewed normal sinus rhythm  Assessment/Plan Principal Problem:   Acute respiratory failure with hypoxia (HCC) Active Problems:   Morbid obesity (HCC)   Bipolar I disorder, most recent episode (or current) manic (HCC)   HYPERTENSION, BENIGN ESSENTIAL   Asthmatic bronchitis   COPD exacerbation (HCC)   OSA (obstructive sleep apnea)   Asthma  #1. Acute respiratory failure with hypoxia secondary to asthma bronchitis  exacerbation. Initially she was maintaining her oxygen saturation levels on room air. She was provided with continuous nebs and Solu-Medrol. Wheezing and increased work of breathing continued. Oxygen saturation level 86% on room air in spite of above measures. -We'll admit to step down -Nebulizers every 4 hours -Solu-Medrol 60 mg IV every 4 hours -Flutter valve -Mucinex twice a day  #2. Asthma bronchitis exacerbation. Trigger unclear. Afebrile nontoxic appearing no leukocytosis. Home medications include Symbicort -Hold home meds for now -Outpatient follow-up with pulmonologist -Chart review indicates pulmonary function tests in 2009 FE V1 64% TLC 105%  #3. Hypertension. Controlled in the emergency department but slightly soft.. Home medications include hydrochlorothiazide, valsartan. -Hold hydrochlorothiazide. -Continue valsartan with parameters -Monitor  #4. Morbid obesity. AMI 50.4 -Nutritional consult  Code Status: full DVT Prophylaxis: Family Communication: husband at bedside Disposition Plan: home in 2-3 days  Time spent: 55 minutes  [facility] M [facility]    I have examined the patient, reviewed the chart and modified the above note which I agree with. Acute dyspnea with cough due to COPD/ Asthma exacerbation. Still wheezing and coughing. Agree with nebs and IV steroids. Add Zpak.   Mickael Mcnutt,MD Pager # on [email or web address] 07/20/2015, 5:48 PM

## 2015-07-20 NOTE — ED Notes (Signed)
Dinner tray arrived pt. Is eating dinner

## 2015-07-20 NOTE — ED Notes (Signed)
Dinner tray ordered.

## 2015-07-20 NOTE — ED Notes (Addendum)
Pt. 's sats dropped to 88-87% with movement , eating. Put pt.on oxygen 3 Liters, sats increased to 92%

## 2015-07-21 LAB — CBC
HCT: 45.2 % (ref 36.0–46.0)
Hemoglobin: 15.1 g/dL — ABNORMAL HIGH (ref 12.0–15.0)
MCH: 30.5 pg (ref 26.0–34.0)
MCHC: 33.4 g/dL (ref 30.0–36.0)
MCV: 91.3 fL (ref 78.0–100.0)
Platelets: 242 10*3/uL (ref 150–400)
RBC: 4.95 MIL/uL (ref 3.87–5.11)
RDW: 14.7 % (ref 11.5–15.5)
WBC: 7.6 10*3/uL (ref 4.0–10.5)

## 2015-07-21 LAB — MRSA PCR SCREENING: MRSA by PCR: NEGATIVE

## 2015-07-21 LAB — BASIC METABOLIC PANEL
Anion gap: 13 (ref 5–15)
BUN: 15 mg/dL (ref 6–20)
CO2: 22 mmol/L (ref 22–32)
Calcium: 9 mg/dL (ref 8.9–10.3)
Chloride: 105 mmol/L (ref 101–111)
Creatinine, Ser: 0.97 mg/dL (ref 0.44–1.00)
GFR calc Af Amer: >60 mL/min (ref >60)
GFR calc non Af Amer: >60 mL/min (ref >60)
Glucose, Bld: 254 mg/dL — ABNORMAL HIGH (ref 65–99)
Potassium: 4.9 mmol/L (ref 3.5–5.1)
Sodium: 140 mmol/L (ref 135–145)

## 2015-07-21 LAB — INFLUENZA PANEL BY PCR (TYPE A & B)
H1N1 flu by pcr: NOT DETECTED
Influenza A By PCR: NEGATIVE
Influenza B By PCR: NEGATIVE

## 2015-07-21 MED ORDER — IPRATROPIUM BROMIDE 0.02 % IN SOLN
RESPIRATORY_TRACT | Status: AC
  Filled 2015-07-21: qty 2.5

## 2015-07-21 MED ORDER — METHYLPREDNISOLONE SODIUM SUCC 125 MG IJ SOLR
60.0000 mg | Freq: Two times a day (BID) | INTRAMUSCULAR | Status: DC
Start: 2015-07-22 — End: 2015-07-22
  Administered 2015-07-22: 60 mg via INTRAVENOUS
  Filled 2015-07-21: qty 2

## 2015-07-21 MED ORDER — ALBUTEROL SULFATE (2.5 MG/3ML) 0.083% IN NEBU: 2.5000 mg | INHALATION_SOLUTION | Freq: Four times a day (QID) | RESPIRATORY_TRACT | Status: DC

## 2015-07-21 MED ORDER — ALBUTEROL SULFATE (2.5 MG/3ML) 0.083% IN NEBU
INHALATION_SOLUTION | RESPIRATORY_TRACT | Status: AC
  Filled 2015-07-21: qty 3

## 2015-07-21 MED ORDER — IPRATROPIUM BROMIDE 0.02 % IN SOLN
0.5000 mg | Freq: Four times a day (QID) | RESPIRATORY_TRACT | Status: DC
  Administered 2015-07-21: 0.5 mg via RESPIRATORY_TRACT

## 2015-07-21 MED ORDER — BUDESONIDE 0.25 MG/2ML IN SUSP
0.2500 mg | Freq: Two times a day (BID) | RESPIRATORY_TRACT | Status: DC
  Administered 2015-07-21 – 2015-07-22 (×2): 0.25 mg via RESPIRATORY_TRACT
  Filled 2015-07-21 (×2): qty 2

## 2015-07-21 MED ORDER — IPRATROPIUM-ALBUTEROL 0.5-2.5 (3) MG/3ML IN SOLN
3.0000 mL | Freq: Four times a day (QID) | RESPIRATORY_TRACT | Status: DC
Start: 2015-07-21 — End: 2015-07-25
  Administered 2015-07-21 – 2015-07-25 (×13): 3 mL via RESPIRATORY_TRACT
  Filled 2015-07-21 (×13): qty 3

## 2015-07-21 MED ORDER — IPRATROPIUM-ALBUTEROL 0.5-2.5 (3) MG/3ML IN SOLN: 3.0000 mL | RESPIRATORY_TRACT | Status: DC

## 2015-07-21 NOTE — Progress Notes (Signed)
[place] TEAM 1 - Stepdown/ICU TEAM PROGRESS NOTE  Brenda [name] ZOX:[id number] DOB: Jul 03, 1960 DOA: 07/20/2015 PCP: [name], MD  Admit HPI / Brief Narrative: 55 y.o. female with history of COPD, hypertension, obesity, and bipolar disorder who presented to the emergency department c/o shortness of breath. Evaluation in the emergency department revealed acute respiratory distress w/ hypoxia due to a probable COPD exacerbation.  HPI/Subjective: The pt states she only feels "a little better."  She denies cp, n/v, or abdom pain, but continues to wheeze and feel sob.    Assessment/Plan:  Acute hypoxic respiratory failure - acute COPD exacerbation No focal infiltrate on chest x-ray to suggest pneumonia - influenza panel negative - cont usual medical tx and follow   Hypertension Blood pressure currently well controlled  Tobacco abuse Counsel on absolute need to stop smoking permanently   Obstructive sleep apnea CPAP daily at bedtime - followed by Dr. Craige Cotta  Morbid obesity - Body mass index is 49.3 kg/(m^2).   Bipolar disorder  Code Status: FULL Family Communication: no family present at time of exam Disposition Plan: SDU  Consultants: None  Procedures: None  Antibiotics: Azithromycin 2/18 >  DVT prophylaxis: Lovenox  Objective: Blood pressure 109/93, pulse 96, temperature 97.8 F (36.6 C), temperature source Oral, resp. rate 22, height  (1.6 m), weight 126.2 kg (278 lb 3.5 oz), SpO2 93 %.  Intake/Output Summary (Last 24 hours) at 07/21/15 1623 Last data filed at 07/21/15 1409  Gross per 24 hour  Intake    840 ml  Output      0 ml  Net    840 ml    Exam: General: Mild respiratory distress with wheeze audible while standing in room Lungs: Good air movement throughout all fields despite wheezing - no focal crackles Cardiovascular: Regular rate and rhythm without murmur gallop or rub normal S1 and S2 Abdomen: Nontender, nondistended, soft, bowel sounds  positive, no rebound, no ascites, no appreciable mass Extremities: No significant cyanosis, or clubbing;  trace edema bilateral lower extremities  Data Reviewed:  Basic Metabolic Panel:  Recent Labs Lab 07/20/15 1412 07/21/15 0415  NA 140 140  K 4.1 4.9  CL 101 105  CO2 27 22  GLUCOSE 117* 254*  BUN 12 15  CREATININE 0.98 0.97  CALCIUM 8.9 9.0    CBC:  Recent Labs Lab 07/20/15 1412 07/21/15 0415  WBC 6.7 7.6  NEUTROABS 4.7  --   HGB 13.5 15.1*  HCT 41.9 45.2  MCV 90.7 91.3  PLT 225 242    Liver Function Tests:  Recent Labs Lab 07/20/15 1412  AST 30  ALT 27  ALKPHOS 94  BILITOT 0.2*  PROT 6.6  ALBUMIN 3.1*    Recent Results (from the past 240 hour(s))  MRSA PCR Screening     Status: None   Collection Time: 07/20/15  8:15 PM  Result Value Ref Range Status   MRSA by PCR NEGATIVE NEGATIVE Final    Comment:        The GeneXpert MRSA Assay (FDA approved for NASAL specimens only), is one component of a comprehensive MRSA colonization surveillance program. It is not intended to diagnose MRSA infection nor to guide or monitor treatment for MRSA infections.      Studies:   Recent x-ray studies have been reviewed in detail by the Attending Physician  Scheduled Meds:  Scheduled Meds: . albuterol  2.5 mg Nebulization Q4H  . albuterol      . antiseptic oral rinse  7 mL Mouth Rinse BID  . azithromycin  250 mg Oral Daily  . budesonide-formoterol  2 puff Inhalation BID  . dextromethorphan  15 mg Oral BID  . enoxaparin (LOVENOX) injection  40 mg Subcutaneous Q24H  . feeding supplement (ENSURE ENLIVE)  237 mL Oral BID BM  . guaiFENesin  600 mg Oral BID  . irbesartan  150 mg Oral Daily  . loratadine  10 mg Oral Daily  . methylPREDNISolone (SOLU-MEDROL) injection  60 mg Intravenous Q4H  . pneumococcal 23 valent vaccine  0.5 mL Intramuscular Tomorrow-1000    Time spent on care of this patient: 35 mins   Brentley Landfair T , MD   [facility] Office  [phone] Pager - Text Page per Loretha Stapler as per below:  On-Call/Text Page:      [email or web address]      password [email or web address]  If 7PM-7AM, please contact night-coverage [email or web address] Password [email or web address] 07/21/2015, 4:23 PM   LOS: 1 day

## 2015-07-21 NOTE — Progress Notes (Signed)
Utilization Review Completed.Brenda [name] T2/19/2017  

## 2015-07-21 NOTE — Evaluation (Signed)
Physical Therapy Evaluation Patient Details Name: Brenda [name] MRN: [id number] DOB: [birth year] Today's Date: 07/21/2015   History of Present Illness  Brenda [name] is a 55 y.o. female with past medical history that includes chronic obstructive bronchitis, hypertension, obesity, bipolar disorder, presents to the emergency department with the chief complaint of persistent worsening shortness of breath. Evaluation in the emergency department reveals acute respiratory distress mild hypoxia due to COPD exacerbation.  Clinical Impression  Pt mobility greatly limited by wheezing and SOB. Pt willing to work with PT but was at 7/10 on Dyspnea scale and 9/10 during activity. Acute PT to con't to follow to progress mobility and work on energy conservation.    Follow Up Recommendations No PT follow up;Supervision - Intermittent    Equipment Recommendations  None recommended by PT (may benefit from RW for energy conservation)    Recommendations for Other Services       Precautions / Restrictions Precautions Precautions: Other (comment) Precaution Comments: sob Restrictions Weight Bearing Restrictions: No      Mobility  Bed Mobility Overal bed mobility: Modified Independent             General bed mobility comments: HOB elevated, use of bed rail  Transfers Overall transfer level: Needs assistance Equipment used: 1 person hand held assist Transfers: Sit to/from Stand;Stand Pivot Transfers Sit to Stand: Min guard Stand pivot transfers: Min guard       General transfer comment: increased time, worsening SOB, 7/10 on scale. SpO2 to 88%  Ambulation/Gait             General Gait Details: limited to std pvt to chair. marched in place x 10 reps. Pt with worsening SOB and SPO2 at 88%  Stairs            Wheelchair Mobility    Modified Rankin (Stroke Patients Only)       Balance Overall balance assessment: No apparent balance deficits (not formally assessed)                                            Pertinent Vitals/Pain Pain Assessment: No/denies pain    Home Living Family/patient expects to be discharged to:: Private residence Living Arrangements: Spouse/significant other Available Help at Discharge: Family;Available PRN/intermittently (both pt and spouse work) Type of Home: House       Home Layout: One level Home Equipment: None      Prior Function Level of Independence: Independent               Hand Dominance   Dominant Hand: Right    Extremity/Trunk Assessment   Upper Extremity Assessment: Overall WFL for tasks assessed           Lower Extremity Assessment: Overall WFL for tasks assessed      Cervical / Trunk Assessment: Normal  Communication   Communication: No difficulties  Cognition Arousal/Alertness: Awake/alert Behavior During Therapy: WFL for tasks assessed/performed Overall Cognitive Status: Within Functional Limits for tasks assessed                      General Comments      Exercises        Assessment/Plan    PT Assessment Patient needs continued PT services  PT Diagnosis Generalized weakness;Difficulty walking   PT Problem List Decreased strength;Decreased activity tolerance;Decreased balance;Decreased mobility;Cardiopulmonary status limiting activity  PT Treatment Interventions DME instruction;Gait training;Stair training;Functional mobility training;Therapeutic activities;Therapeutic exercise;Balance training   PT Goals (Current goals can be found in the Care Plan section) Acute Rehab PT Goals Patient Stated Goal: improve my breathing PT Goal Formulation: With patient Time For Goal Achievement: 07/28/15 Potential to Achieve Goals: Good    Frequency Min 2X/week   Barriers to discharge        Co-evaluation               End of Session Equipment Utilized During Treatment: Oxygen Activity Tolerance:  (limited by wheezing and SOB) Patient left: in  chair;with call bell/phone within reach Nurse Communication: Mobility status         Time: 1010-1030 PT Time Calculation (min) (ACUTE ONLY): 20 min   Charges:   PT Evaluation $PT Eval Low Complexity: 1 Procedure     PT G CodesMarcene Brawn 07/21/2015, 1:58 PM   Lewis Shock, PT, DPT Pager #: [phone] Office #: [phone]

## 2015-07-22 ENCOUNTER — Inpatient Hospital Stay (HOSPITAL_COMMUNITY): Payer: Managed Care, Other (non HMO)

## 2015-07-22 DIAGNOSIS — J9601 Acute respiratory failure with hypoxia: Secondary | ICD-10-CM

## 2015-07-22 LAB — MAGNESIUM: Magnesium: 2.4 mg/dL (ref 1.7–2.4)

## 2015-07-22 LAB — BASIC METABOLIC PANEL WITH GFR
Anion gap: 8 (ref 5–15)
BUN: 22 mg/dL — ABNORMAL HIGH (ref 6–20)
CO2: 26 mmol/L (ref 22–32)
Calcium: 9 mg/dL (ref 8.9–10.3)
Chloride: 103 mmol/L (ref 101–111)
Creatinine, Ser: 1.06 mg/dL — ABNORMAL HIGH (ref 0.44–1.00)
GFR calc Af Amer: >60 mL/min (ref >60)
GFR calc non Af Amer: 57 mL/min — ABNORMAL LOW (ref >60)
Glucose, Bld: 219 mg/dL — ABNORMAL HIGH (ref 65–99)
Potassium: 4.5 mmol/L (ref 3.5–5.1)
Sodium: 137 mmol/L (ref 135–145)

## 2015-07-22 MED ORDER — BUDESONIDE 0.5 MG/2ML IN SUSP
0.5000 mg | Freq: Two times a day (BID) | RESPIRATORY_TRACT | Status: DC
  Administered 2015-07-22 – 2015-07-26 (×8): 0.5 mg via RESPIRATORY_TRACT
  Filled 2015-07-22 (×8): qty 2

## 2015-07-22 MED ORDER — FUROSEMIDE 10 MG/ML IJ SOLN
40.0000 mg | Freq: Two times a day (BID) | INTRAMUSCULAR | Status: DC
  Administered 2015-07-22 – 2015-07-26 (×8): 40 mg via INTRAVENOUS
  Filled 2015-07-22 (×8): qty 4

## 2015-07-22 MED ORDER — METHYLPREDNISOLONE SODIUM SUCC 125 MG IJ SOLR
60.0000 mg | Freq: Three times a day (TID) | INTRAMUSCULAR | Status: DC
  Administered 2015-07-22 – 2015-07-25 (×9): 60 mg via INTRAVENOUS
  Filled 2015-07-22 (×9): qty 2

## 2015-07-22 NOTE — Progress Notes (Signed)
Patient lying in bed, tachypneic, sats 91% on 3l/min with good wave form.  Patient cannot finish a sentence without increased dyspnea.  Pt states her breathing is "not good".  Inspiratory/expiratory wheezes heard bilat anteriorly.  Text paged Dr. Sharon Seller for assistance and update.

## 2015-07-22 NOTE — Progress Notes (Signed)
Nutrition Brief Note  Received nutrition consult via COPD Gold order set.  Per pt she has lost a few pounds recently by intentionally trying to lose weight. She endorses a decreased appetite for the last few days. She states that despite this she has been able to eat 100% of her meals.  She lives at home with her husband. Pt does the cooking. She states that will all activity she sometimes sits until her breathing spells go away. She has worked with PT this admission.  Encouraged continued weight loss.    Wt Readings from Last 15 Encounters:  07/20/15 278 lb 3.5 oz (126.2 kg)  06/07/15 284 lb 12.8 oz (129.184 kg)  03/25/15 288 lb (130.636 kg)  02/25/15 292 lb (132.45 kg)  12/17/14 288 lb (130.636 kg)  11/01/14 292 lb (132.45 kg)  09/20/14 286 lb 4 oz (129.842 kg)  09/14/14 285 lb 4 oz (129.389 kg)  08/27/14 287 lb 6.4 oz (130.364 kg)  11/17/13 288 lb 3.2 oz (130.727 kg)  10/06/13 288 lb 6.4 oz (130.817 kg)  09/22/13 293 lb 12.8 oz (133.267 kg)  09/18/13 289 lb 4 oz (131.203 kg)  08/21/13 283 lb 8 oz (128.595 kg)  07/31/13 270 lb (122.471 kg)    Body mass index is 49.3 kg/(m^2). Patient meets criteria for morbid obesity based on current BMI.   Current diet order is clear liquids, patient is consuming approximately 100% of meals at this time. Labs and medications reviewed.   No nutrition interventions warranted at this time. If nutrition issues arise, please consult RD.   Kendell Bane RD, LDN, CNSC [phone] Pager [phone] After Hours Pager

## 2015-07-22 NOTE — Progress Notes (Signed)
Patient is refusing arterial puncture at this time.  When explained that it was arterial the patient got upset and started crying.  I asked her several questions to assess mentation and she is alert to time and place.  RN notified.  RN will page MD for additional communication.

## 2015-07-22 NOTE — Consult Note (Signed)
Name: Brenda [name] MRN: [id number] DOB: Jul 28, 1960    ADMISSION DATE:  07/20/2015 CONSULTATION DATE:  2/20  REFERRING MD :  Sharon Seller  CHIEF COMPLAINT:  Respiratory failure, COPD   BRIEF PATIENT DESCRIPTION:  55yo female active smoker with hx COPD, OSA on CPAP, HTN, bipolar disorder who was initially admitted 2/18 with AECOPD.  CXR clear and flu panel negative.  She was admitted by [facility] and started on BD's, Abx and IV steroids.  Despite this, on 2/20 she had progressive SOB and increased WOB and PCCM consulted to assist.    SIGNIFICANT EVENTS    STUDIES:  Flu PCR 2/18>>> NEG  HISTORY OF PRESENT ILLNESS:  55yo female with hx COPD, OSA on CPAP, HTN, bipolar disorder who was initially admitted 2/18 with AECOPD.  CXR clear and flu panel negative.  She was admitted by [facility] and started on BD's, Abx and IV steroids.  Despite this, on 2/20 she had progressive SOB and increased WOB and PCCM consulted to assist.    Currently c/o increased SOB, non productive cough.  SOB worse with exertion, she is "worn out" now from getting OOB to bathroom a few minutes ago.  Was using SABA "all day long" at home prior to admit.  Requires SABA 3-4x daily on a "good day".   Has not worn CPAP since admission.  Denies chest pain, hemoptysis, purulent sputum, fevers, chills, orthopnea.   PAST MEDICAL HISTORY :   has a past medical history of Bronchitis, chronic obstructive (HCC) (2008 & 2009); Nonspecific abnormal electrocardiogram (ECG) (EKG); Leukocytosis, unspecified; HTN (hypertension); migraines; Bipolar I disorder, most recent episode (or current) manic, unspecified; Pure hyperglyceridemia; Acute pancreatitis; COPD (chronic obstructive pulmonary disease) (HCC); Nicotine addiction; Asthma; Obesity; Swelling of limb; Knee pain, right; Suicide attempt (HCC) (06/2001); History of pyelonephritis (05/2012); Lumbar disc disease with radiculopathy; and Urge and stress incontinence (07/2012).  has past surgical history that  includes lumbar MRI (11/2010); Cholecystectomy (07/1982); Tubal ligation; Retained stone; Right knee surgery (4098,1191,4782,9562,1308); ERCP / sphincterotomy - stenosis (01/15/06); CT MAXILLOFACIAL WO/W CM (06/14/06); CT of chest (06/14/2006); and [facility] - Pneumonia, acute asthma, left maxillary sinusitis (01/11 - 06/18/2006). Prior to Admission medications   Medication Sig Start Date End Date Taking? Authorizing Provider  albuterol (PROVENTIL) (2.5 MG/3ML) 0.083% nebulizer solution INHALE 1 VIAL VIA NEBULIZER EVERY 6 HOURS AS NEEDED FOR WHEEZING/SHORT BREATH 07/02/15  Yes [name], MD  Albuterol Sulfate (PROAIR RESPICLICK) 108 (90 Base) MCG/ACT AEPB Inhale 2 puffs into the lungs every 6 (six) hours as needed. Dyspnea/wheeze 06/11/15  Yes [name], MD  budesonide-formoterol [facility]) 160-4.5 MCG/ACT inhaler Inhale 2 puffs into the lungs 2 (two) times daily. 03/25/15  Yes Tammy S Parrett, NP  cetirizine (ZYRTEC) 10 MG tablet Take 10 mg by mouth daily as needed for allergies.   Yes Historical Provider, MD  hydrochlorothiazide (HYDRODIURIL) 25 MG tablet Take 1 tablet (25 mg total) by mouth daily. 11/09/14  Yes [name], MD  levalbuterol (XOPENEX) 1.25 MG/3ML nebulizer solution USE ONE  IN NEBULIZER EVERY 4 HOURS AS NEEDED FOR WHEEZING 11/22/14  Yes Nyoka Cowden, MD  tiotropium (SPIRIVA HANDIHALER) 18 MCG inhalation capsule Place 1 capsule (18 mcg total) into inhaler and inhale daily. 03/25/15  Yes Tammy S Parrett, NP  valsartan (DIOVAN) 160 MG tablet Take 1 tablet (160 mg total) by mouth daily. 07/05/15  Yes [name], MD   Allergies  Allergen Reactions  . Metolazone     REACTION: metabolic mood swings  FAMILY HISTORY:  family history includes Asthma in her father; Breast cancer in her paternal grandmother; Heart disease in her brother; Hyperlipidemia in her mother; Hypertension in her father and mother. SOCIAL HISTORY:  reports that she has been smoking  Cigarettes.  She has a 15 pack-year smoking history. She has never used smokeless tobacco. She reports that she does not drink alcohol or use illicit drugs.  REVIEW OF SYSTEMS:   As per HPI - All other systems reviewed and were neg.    SUBJECTIVE:   VITAL SIGNS: Temp:  [97 F (36.1 C)-98 F (36.7 C)] 97.6 F (36.4 C) (02/20 0732) Pulse Rate:  [83-104] 104 (02/20 0732) Resp:  [19-32] 22 (02/20 0732) BP: (109-135)/(69-93) 135/69 mmHg (02/20 0732) SpO2:  [91 %-94 %] 92 % (02/20 0751)  PHYSICAL EXAMINATION: General:  Obese female, mildly dyspneic, talking on phone Neuro:  Awake, alert, appropriate, MAE  HEENT:  Mm moist, no JVD  Cardiovascular:  s1s2 rrr Lungs:  resps even, mildly labored, speaks in short sentences, very diminished throughout, rare exp wheeze  Abdomen:  Round, soft, +bs  Musculoskeletal:  Warm and dry, no edema   Recent Labs Lab 07/20/15 1412 07/21/15 0415 07/22/15 0359  NA 140 140 137  K 4.1 4.9 4.5  CL 101 105 103  CO2 BUN 12 15 22*  CREATININE 0.98 0.97 1.06*  GLUCOSE 117* 254* 219*    Recent Labs Lab 07/20/15 1412 07/21/15 0415  HGB 13.5 15.1*  HCT 41.9 45.2  WBC 6.7 7.6  PLT 225 242   Dg Chest Port 1 View  07/20/2015  CLINICAL DATA:  Shortness of breath EXAM: PORTABLE CHEST 1 VIEW COMPARISON:  03/25/15 chest radiograph. FINDINGS: Stable cardiomediastinal silhouette with normal heart size. No pneumothorax. No pleural effusion. Lungs appear clear, with no acute consolidative airspace disease and no pulmonary edema. IMPRESSION: No active disease. Electronically Signed   By: Delbert Phenix M.D.   On: 07/20/2015 12:58    ASSESSMENT / PLAN:  Acute respiratory failure  AECOPD  OSA   PLAN -  Repeat CXR now  Increase solumedrol to  IV q8 and wean as able  PRN bipap duonebs q6, budesonide BID  Pulmonary hygiene  qhs CPAP Smoking cessation discussed at length    Discussed with Dr. Sharon Seller and RT at bedside.    Dirk Dress, NP 07/22/2015  9:42 AM Pager: [phone] or [phone]

## 2015-07-22 NOTE — Progress Notes (Signed)
Inpatient Diabetes Program Recommendations  AACE/ADA: New Consensus Statement on Inpatient Glycemic Control (2015)  Target Ranges:  Prepandial:   less than 140 mg/dL      Peak postprandial:   less than 180 mg/dL (1-2 hours)      Critically ill patients:  140 - 180 mg/dL   Results for TAUNJA, BRICKNER (MRN [id number]) as of 07/22/2015 08:22  Ref. Range 07/21/2015 04:15 07/22/2015 03:59  Glucose Latest Ref Range: 65-99 mg/dL 409 (H) 811 (H)   Review of Glycemic Control  Diabetes history: None Current orders for Inpatient glycemic control: None  Inpatient Diabetes Program Recommendations: Correction (SSI): Lab glucose for the past 2 days 254, 219 mg/dl. Patient also on IV solumerdol 60 mg Q12hrs. While inpatient, please consider starting CBGS and Novolog Sensitive Correction to cover the steroid hyperglycemia.  Thanks,  Christena Deem RN, MSN, [facility] Inpatient Diabetes Coordinator Team Pager [phone] (8a-5p)

## 2015-07-22 NOTE — Progress Notes (Signed)
[place] TEAM 1 - Stepdown/ICU TEAM PROGRESS NOTE  Brenda [name]:[id number] DOB: 20-Mar-1961 DOA: 07/20/2015 PCP: [name], MD  Admit HPI / Brief Narrative: 55 y.o. female with history of COPD, hypertension, obesity, and bipolar disorder who presented to the emergency department c/o shortness of breath. Evaluation in the emergency department revealed acute respiratory distress w/ hypoxia due to a probable COPD exacerbation.  HPI/Subjective: I was called to the floor by the patient's nurse reporting significant respiratory difficulty.  When I arrived in the room I  found the patient sitting up at the bedside eating breakfast but also in significant respiratory distress.  Her work of breathing was increased and she was moving little air though she was able to continue to eat breakfast.  The patient stated she had gotten up to go to the bathroom and this exertion led to her acute distress.  She does not feel that she is improved any since yesterday and in fact feels a little bit worse today.  She denies chest pain fevers chills nausea or vomiting.  Assessment/Plan:  Acute hypoxic respiratory failure - acute COPD exacerbation No focal infiltrate on chest x-ray to suggest pneumonia - influenza panel negative - cont usual medical tx - given her decline this morning, and the fact that she would be an exceptionally difficult intubation, I have asked PCCM to see her in consultation (she is followed in the office by Dr. Craige Cotta)  Hypertension Blood pressure currently well controlled  Tobacco abuse Counsel on absolute need to stop smoking permanently   Obstructive sleep apnea CPAP daily at bedtime - followed by Dr. Craige Cotta  Morbid obesity - Body mass index is 49.3 kg/(m^2).   Bipolar disorder  Code Status: FULL Family Communication: no family present at time of exam Disposition Plan: SDU  Consultants: None  Procedures: None  Antibiotics: Azithromycin 2/18 >  DVT  prophylaxis: Lovenox  Objective: Blood pressure 135/69, pulse 104, temperature 97.6 F (36.4 C), temperature source Oral, resp. rate 22, height  (1.6 m), weight 126.2 kg (278 lb 3.5 oz), SpO2 92 %.  Intake/Output Summary (Last 24 hours) at 07/22/15 0859 Last data filed at 07/21/15 2336  Gross per 24 hour  Intake    960 ml  Output      0 ml  Net    960 ml   Exam: General: More evident respiratory distress with increased work of breathing and tachypnea Lungs: Poor air movement throughout but moving enough air to appreciate diffuse expiratory wheezing Cardiovascular: Tachycardic but regular without appreciable murmur - heart sounds distant Abdomen: Nontender, morbidly obese, soft, bowel sounds positive, no rebound, no ascites, no appreciable mass Extremities: No significant cyanosis, or clubbing;  trace edema bilateral lower extremities  Data Reviewed:  Basic Metabolic Panel:  Recent Labs Lab 07/20/15 1412 07/21/15 0415 07/22/15 0359  NA 140 140 137  K 4.1 4.9 4.5  CL 101 105 103  CO2 GLUCOSE 117* 254* 219*  BUN 12 15 22*  CREATININE 0.98 0.97 1.06*  CALCIUM 8.9 9.0 9.0  MG  --   --  2.4    CBC:  Recent Labs Lab 07/20/15 1412 07/21/15 0415  WBC 6.7 7.6  NEUTROABS 4.7  --   HGB 13.5 15.1*  HCT 41.9 45.2  MCV 90.7 91.3  PLT 225 242    Liver Function Tests:  Recent Labs Lab 07/20/15 1412  AST 30  ALT 27  ALKPHOS 94  BILITOT 0.2*  PROT 6.6  ALBUMIN  3.1*    Recent Results (from the past 240 hour(s))  MRSA PCR Screening     Status: None   Collection Time: 07/20/15  8:15 PM  Result Value Ref Range Status   MRSA by PCR NEGATIVE NEGATIVE Final    Comment:        The GeneXpert MRSA Assay (FDA approved for NASAL specimens only), is one component of a comprehensive MRSA colonization surveillance program. It is not intended to diagnose MRSA infection nor to guide or monitor treatment for MRSA infections.      Studies:   Recent  x-ray studies have been reviewed in detail by the Attending Physician  Scheduled Meds:  Scheduled Meds: . antiseptic oral rinse  7 mL Mouth Rinse BID  . azithromycin  250 mg Oral Daily  . budesonide (PULMICORT) nebulizer solution  0.25 mg Nebulization BID  . dextromethorphan  15 mg Oral BID  . enoxaparin (LOVENOX) injection  40 mg Subcutaneous Q24H  . feeding supplement (ENSURE ENLIVE)  237 mL Oral BID BM  . guaiFENesin  600 mg Oral BID  . ipratropium-albuterol  3 mL Nebulization QID  . loratadine  10 mg Oral Daily  . methylPREDNISolone (SOLU-MEDROL) injection  60 mg Intravenous Q12H  . pneumococcal 23 valent vaccine  0.5 mL Intramuscular Tomorrow-1000    Time spent on care of this patient: 35 mins   Talitha Dicarlo T , MD   [facility] Office  [phone] Pager - Text Page per Loretha Stapler as per below:  On-Call/Text Page:      [email or web address]      password [email or web address]  If 7PM-7AM, please contact night-coverage [email or web address] Password [email or web address] 07/22/2015, 8:59 AM   LOS: 2 days

## 2015-07-23 ENCOUNTER — Ambulatory Visit (HOSPITAL_COMMUNITY): Payer: Managed Care, Other (non HMO)

## 2015-07-23 DIAGNOSIS — G4733 Obstructive sleep apnea (adult) (pediatric): Secondary | ICD-10-CM

## 2015-07-23 DIAGNOSIS — R609 Edema, unspecified: Secondary | ICD-10-CM

## 2015-07-23 DIAGNOSIS — J80 Acute respiratory distress syndrome: Secondary | ICD-10-CM

## 2015-07-23 DIAGNOSIS — J441 Chronic obstructive pulmonary disease with (acute) exacerbation: Principal | ICD-10-CM

## 2015-07-23 DIAGNOSIS — F3110 Bipolar disorder, current episode manic without psychotic features, unspecified: Secondary | ICD-10-CM

## 2015-07-23 LAB — BASIC METABOLIC PANEL
Anion gap: 11 (ref 5–15)
BUN: 28 mg/dL — ABNORMAL HIGH (ref 6–20)
CO2: 28 mmol/L (ref 22–32)
Calcium: 8.8 mg/dL — ABNORMAL LOW (ref 8.9–10.3)
Chloride: 99 mmol/L — ABNORMAL LOW (ref 101–111)
Creatinine, Ser: 0.98 mg/dL (ref 0.44–1.00)
GFR calc Af Amer: >60 mL/min (ref >60)
GFR calc non Af Amer: >60 mL/min (ref >60)
Glucose, Bld: 190 mg/dL — ABNORMAL HIGH (ref 65–99)
Potassium: 4.6 mmol/L (ref 3.5–5.1)
Sodium: 138 mmol/L (ref 135–145)

## 2015-07-23 LAB — MAGNESIUM: Magnesium: 2.3 mg/dL (ref 1.7–2.4)

## 2015-07-23 LAB — PHOSPHORUS: Phosphorus: 4.2 mg/dL (ref 2.5–4.6)

## 2015-07-23 MED ORDER — PERFLUTREN LIPID MICROSPHERE
1.0000 mL | INTRAVENOUS | Status: AC | PRN
  Administered 2015-07-23: 3 mL via INTRAVENOUS
  Filled 2015-07-23: qty 10

## 2015-07-23 NOTE — Progress Notes (Signed)
[place] TEAM 1 - Stepdown/ICU TEAM Progress Note  Brenda [name]:[id number] DOB: [birth year] DOA: 07/20/2015 PCP: [name], MD  Admit HPI / Brief Narrative: 55 y.o.WF PMHx Bipolar DO, Suicide Attempt, Migraines, COPD, chronic obstructive bronchitis, tobacco abuse, HTN, Abnormal EKG,Obesity,  Presents to the emergency department with the chief complaint of persistent worsening shortness of breath. Evaluation in the emergency department reveals acute respiratory distress mild hypoxia due to COPD exacerbation.  Information obtained from the patient reports a 3 day history of gradual persistent worsening of shortness of breath and cough. She reports productive cough with thick white sputum. She states her home nebulizer and inhalers did not help. Associated symptoms include chills with subjective fever chest pain with coughing. She does not wear oxygen at home. She also reports she had an appointment with her pulmonologist last week had to cancel.  In the emergency department he is afebrile hemodynamically stable with an oxygen saturation level of 86% on room air after continuous nebs and Solu-Medrol.   HPI/Subjective: 2/21 A/O 4, states does not use home O2. Uses CPAP at night. Sees Dr. Sherene Sires and Dr. Craige Cotta PCCM  Assessment/Plan: Acute hypoxic respiratory failure - acute COPD exacerbation No focal infiltrate on chest x-ray to suggest pneumonia - influenza panel negative - cont usual medical tx - given her decline this morning, and the fact that she would be an exceptionally difficult intubation, I have asked PCCM to see her in consultation (she is followed in the office by Dr. Craige Cotta) -Continue Pulmicort nebulizer BID -Continue DuoNeb QID -Continue Solu-Medrol 60 mg TID -Titrate O2 to maintain SPO2 89 and 93% -To chair, ambulate as tolerated q shift -PT/OT consulted; CIR vs SNF  Hypertension -Blood pressure currently well controlled  Tobacco abuse -Counsel on absolute need to  stop smoking permanently   Obstructive sleep apnea -CPAP daily at bedtime - followed by Dr. Craige Cotta  Morbid obesity - Body mass index is 49.3 kg/(m^2).   Bipolar disorder -On admission patient not on medication  Code Status: FULL Family Communication: no family present at time of exam Disposition Plan:  CIR vs SNF    Consultants: Rudi [facility]  Procedure/Significant Events: Echocardiogram pending   Culture 2/18 MRSA by PCR negative 2/18 influenza A/B/H1N1 negative   Antibiotics: Azithromycin 2/18 >  DVT prophylaxis: Lovenox   Devices    LINES / TUBES:  NA    Continuous Infusions:   Objective: VITAL SIGNS: Temp: 97.4 F (36.3 C) (02/21 1915) Temp Source: Oral (02/21 1915) BP: 141/90 mmHg (02/21 1915) Pulse Rate: 91 (02/21 1915) SPO2; FIO2:   Intake/Output Summary (Last 24 hours) at 07/23/15 2032 Last data filed at 07/23/15 8119  Gross per 24 hour  Intake    480 ml  Output   3375 ml  Net  -2895 ml     Exam: General: A/O 4, positive acute respiratory distress Eyes: Negative headache, negative scleral hemorrhage ENT: Negative Runny nose, negative gingival bleeding, Neck:  Negative scars, masses, torticollis, lymphadenopathy, JVD Lungs: poor air movement diffusely, diffuse inspiratory and expiratory wheezing, negative crackles Cardiovascular:  Tachycardic,Regular rhythm without murmur gallop or rub normal S1 and S2 Abdomen: morbidly obese,negative abdominal pain, nondistended, positive soft, bowel sounds, no rebound, no ascites, no appreciable mass Extremities: No significant cyanosis, clubbing, or edema bilateral lower extremities Psychiatric:  Negative depression, negative anxiety, negative fatigue, negative mania  Neurologic:  Cranial nerves II through XII intact, tongue/uvula midline, all extremities muscle strength 5/5, sensation intact throughout,  negative dysarthria,  negative expressive aphasia, negative receptive  aphasia.   Data Reviewed: Basic Metabolic Panel:  Recent Labs Lab 07/20/15 1412 07/21/15 0415 07/22/15 0359 07/23/15 0451  NA 140 140 137 138  K 4.1 4.9 4.5 4.6  CL 101 105 103 99*  CO2 GLUCOSE 117* 254* 219* 190*  BUN 12 15 22* 28*  CREATININE 0.98 0.97 1.06* 0.98  CALCIUM 8.9 9.0 9.0 8.8*  MG  --   --  2.4 2.3  PHOS  --   --   --  4.2   Liver Function Tests:  Recent Labs Lab 07/20/15 1412  AST 30  ALT 27  ALKPHOS 94  BILITOT 0.2*  PROT 6.6  ALBUMIN 3.1*   No results for input(s): LIPASE, AMYLASE in the last 168 hours. No results for input(s): AMMONIA in the last 168 hours. CBC:  Recent Labs Lab 07/20/15 1412 07/21/15 0415  WBC 6.7 7.6  NEUTROABS 4.7  --   HGB 13.5 15.1*  HCT 41.9 45.2  MCV 90.7 91.3  PLT 225 242   Cardiac Enzymes: No results for input(s): CKTOTAL, CKMB, CKMBINDEX, TROPONINI in the last 168 hours. BNP (last 3 results)  Recent Labs  07/20/15 1412  BNP 15.4    ProBNP (last 3 results)  Recent Labs  04/01/15 1645  PROBNP 22.0    CBG: No results for input(s): GLUCAP in the last 168 hours.  Recent Results (from the past 240 hour(s))  MRSA PCR Screening     Status: None   Collection Time: 07/20/15  8:15 PM  Result Value Ref Range Status   MRSA by PCR NEGATIVE NEGATIVE Final    Comment:        The GeneXpert MRSA Assay (FDA approved for NASAL specimens only), is one component of a comprehensive MRSA colonization surveillance program. It is not intended to diagnose MRSA infection nor to guide or monitor treatment for MRSA infections.      Studies:  Recent x-ray studies have been reviewed in detail by the Attending Physician  Scheduled Meds:  Scheduled Meds: . antiseptic oral rinse  7 mL Mouth Rinse BID  . azithromycin  250 mg Oral Daily  . budesonide (PULMICORT) nebulizer solution  0.5 mg Nebulization BID  . dextromethorphan  15 mg Oral BID  . enoxaparin (LOVENOX) injection  40 mg Subcutaneous  Q24H  . feeding supplement (ENSURE ENLIVE)  237 mL Oral BID BM  . furosemide  40 mg Intravenous Q12H  . guaiFENesin  600 mg Oral BID  . ipratropium-albuterol  3 mL Nebulization QID  . loratadine  10 mg Oral Daily  . methylPREDNISolone (SOLU-MEDROL) injection  60 mg Intravenous 3 times per day  . pneumococcal 23 valent vaccine  0.5 mL Intramuscular Tomorrow-1000    Time spent on care of this patient: 40 mins   Deshaun Schou, Roselind Messier , MD  [facility] Office  [phone] Pager - [phone]  On-Call/Text Page:      [email or web address]      password [email or web address]  If 7PM-7AM, please contact night-coverage [email or web address] Password [email or web address] 07/23/2015, 8:32 PM   LOS: 3 days   Care during the described time interval was provided by me .  I have reviewed this patient's available data, including medical history, events of note, physical examination, and all test results as part of my evaluation. I have personally reviewed and interpreted all radiology studies.   Carolyne Littles, MD [phone] Pager

## 2015-07-23 NOTE — Progress Notes (Signed)
VASCULAR LAB PRELIMINARY  PRELIMINARY  PRELIMINARY  PRELIMINARY  Bilateral lower extremity venous duplex completed.    Bilateral:  No evidence of DVT, superficial thrombosis, or Baker's Cyst.   Jenetta Loges, RVT, RDMS 07/23/2015, 4:22 PM

## 2015-07-23 NOTE — Progress Notes (Signed)
Echocardiogram 2D Echocardiogram with Definity has been performed.  Brenda [name] 07/23/2015, 2:00 PM

## 2015-07-23 NOTE — Progress Notes (Signed)
Name: Brenda [name] MRN: [id number] DOB: [birth year]    ADMISSION DATE:  07/20/2015 CONSULTATION DATE:  2/20  REFERRING MD :  Sharon Seller  CHIEF COMPLAINT:  Respiratory failure, COPD    SUBJECTIVE: Pt reports feeling better than on admit but not back to normal.  No flutter valve in room or IS but ordered.    VITAL SIGNS: Temp:  [97.3 F (36.3 C)-98 F (36.7 C)] 98 F (36.7 C) (02/21 0732) Pulse Rate:  [69-87] 69 (02/21 0405) Resp:  [15-27] 15 (02/21 0405) BP: (106-130)/(67-85) 106/67 mmHg (02/21 0405) SpO2:  [92 %-98 %] 93 % (02/21 0732) FiO2 (%):  [40 %] 40 % (02/21 0732)  PHYSICAL EXAMINATION: General:  Obese [name], mildly dyspneic at rest but no distress Neuro:  Awake, alert, appropriate, MAE  HEENT:  Mm moist, no JVD  Cardiovascular:  s1s2 rrr, SR on monitor  Lungs:  resps even, mildly labored, speaks in short sentences, diminished, few basilar crackles that clear with cough, few scattered wheezes  Abdomen:  Round, soft, +bs  Musculoskeletal:  Warm and dry, no edema   Recent Labs Lab 07/21/15 0415 07/22/15 0359 07/23/15 0451  NA 140 137 138  K 4.9 4.5 4.6  CL 105 103 99*  CO2 BUN 15 22* 28*  CREATININE 0.97 1.06* 0.98  GLUCOSE 254* 219* 190*    Recent Labs Lab 07/20/15 1412 07/21/15 0415  HGB 13.5 15.1*  HCT 41.9 45.2  WBC 6.7 7.6  PLT 225 242   Dg Chest Portable 1 View  07/22/2015  CLINICAL DATA:  Smoker, asthma, COPD.  Respiratory failure EXAM: PORTABLE CHEST 1 VIEW COMPARISON:  07/20/2015 FINDINGS: Heart is normal size. Left basilar opacity could reflect pneumonia. Right lung is clear. Mild peribronchial thickening. No effusions or acute bony abnormality. IMPRESSION: Mild bronchitic changes. Left basilar opacity. Cannot exclude pneumonia. Electronically Signed   By: Charlett Nose M.D.   On: 07/22/2015 10:04    SIGNIFICANT EVENTS  2/18  Admit with SOB   STUDIES:  Flu PCR 2/18 >> NEG    ASSESSMENT / PLAN:  DISCUSSION: Brenda [name]  active smoker with hx COPD, OSA on CPAP, obesity, HTN, bipolar disorder who was initially admitted 2/18 with AECOPD.  CXR clear and flu panel negative.  She was admitted by [facility] and started on BD's, Abx and IV steroids.  Despite this, on 2/20 she had progressive SOB and increased WOB and PCCM consulted to assist.      Acute Hypoxic Respiratory Failure - in setting of COPD exacerbation, flu negative.  Repeat CXR 2/20 with ? Left basilar opacity but not significantly different than 2/18 imaging, suspect atelectasis given no fever / WBC or clinical PNA.  AECOPD  Ongoing Tobacco Abuse   OSA   PLAN:  Solumedrol to  IV q8 and wean as able  QHS CPAP Duonebs q6, budesonide BID  Pulmonary hygiene - IS / Flutter, mobilize  Smoking cessation encouraged  Continue azithromycin  Delsym for cough / mucinex  Claritin  Lasix 40 mg Q12    [name], NP-C [facility] Pgr: [phone] or if no answer [phone] 07/23/2015, 9:31 AM    Attending Note:  I have examined patient, reviewed labs, studies and notes. I have discussed the case with Brenda [name], and I agree with the data and plans as amended above. She tolerated BiPAp all night, is now stable off of it. Still with some wheeze but improved. CXR today without convincing infiltrate  and no clinical PNA. Will try to transition her to CPAP qhs, auto-set. Would change steroids to Po pred on 2/22.   Brenda Pupa, MD, PhD 07/23/2015, 10:43 AM [facility] [phone] or if no answer [phone]

## 2015-07-24 DIAGNOSIS — R739 Hyperglycemia, unspecified: Secondary | ICD-10-CM

## 2015-07-24 DIAGNOSIS — Z720 Tobacco use: Secondary | ICD-10-CM

## 2015-07-24 LAB — BASIC METABOLIC PANEL
Anion gap: 10 (ref 5–15)
BUN: 29 mg/dL — ABNORMAL HIGH (ref 6–20)
CO2: 30 mmol/L (ref 22–32)
Calcium: 8.7 mg/dL — ABNORMAL LOW (ref 8.9–10.3)
Chloride: 97 mmol/L — ABNORMAL LOW (ref 101–111)
Creatinine, Ser: 0.95 mg/dL (ref 0.44–1.00)
GFR calc Af Amer: >60 mL/min (ref >60)
GFR calc non Af Amer: >60 mL/min (ref >60)
Glucose, Bld: 242 mg/dL — ABNORMAL HIGH (ref 65–99)
Potassium: 4.4 mmol/L (ref 3.5–5.1)
Sodium: 137 mmol/L (ref 135–145)

## 2015-07-24 LAB — MAGNESIUM: Magnesium: 2.5 mg/dL — ABNORMAL HIGH (ref 1.7–2.4)

## 2015-07-24 LAB — PHOSPHORUS: Phosphorus: 3.9 mg/dL (ref 2.5–4.6)

## 2015-07-24 LAB — GLUCOSE, CAPILLARY
Glucose-Capillary: 309 mg/dL — ABNORMAL HIGH (ref 65–99)
Glucose-Capillary: 330 mg/dL — ABNORMAL HIGH (ref 65–99)
Glucose-Capillary: 330 mg/dL — ABNORMAL HIGH (ref 65–99)

## 2015-07-24 MED ORDER — INSULIN ASPART 100 UNIT/ML ~~LOC~~ SOLN
0.0000 [IU] | SUBCUTANEOUS | Status: DC
  Administered 2015-07-24 (×3): 11 [IU] via SUBCUTANEOUS
  Administered 2015-07-25: 3 [IU] via SUBCUTANEOUS
  Administered 2015-07-25: 8 [IU] via SUBCUTANEOUS
  Administered 2015-07-25: 11 [IU] via SUBCUTANEOUS
  Administered 2015-07-25: 3 [IU] via SUBCUTANEOUS

## 2015-07-24 NOTE — Evaluation (Signed)
Occupational Therapy Evaluation Patient Details Name: Brenda [name] MRN: [id number] DOB: Jun 05, 1960 Today's Date: 07/24/2015    History of Present Illness Brenda [name] is a 55 y.o. female with past medical history that includes chronic obstructive bronchitis, hypertension, obesity, bipolar disorder, presents to the emergency department with the chief complaint of persistent worsening shortness of breath. Evaluation in the emergency department reveals acute respiratory distress mild hypoxia due to COPD exacerbation.   Clinical Impression   Patient evaluated by Occupational Therapy with no further acute OT needs identified. All education has been completed and the patient has no further questions. See below for any follow-up Occupational Therapy or equipment needs. OT to sign off. Thank you for referral.   Pt educated on placement of grab bar next to commode vertical because patient describes pulling on half wall at home. Pt without space to place it horizontal.     Follow Up Recommendations  No OT follow up    Equipment Recommendations  None recommended by OT    Recommendations for Other Services       Precautions / Restrictions Precautions Precautions: None Precaution Comments: watch O2      Mobility Bed Mobility               General bed mobility comments: in chair on arrival  Transfers Overall transfer level: Modified independent               General transfer comment: pt able to stand from recliner without use of hands.     Balance                                            ADL Overall ADL's : Modified independent                                       General ADL Comments: educated on energy conservation. Pt plans to change water temperature at home to help with breathing. pt encouraged to sit for task that take longer than 5 minutes. Pt with handout provided and plans to pick 2 items to do once d/c home.       Vision     Perception     Praxis      Pertinent Vitals/Pain Pain Assessment: No/denies pain     Hand Dominance Right   Extremity/Trunk Assessment Upper Extremity Assessment Upper Extremity Assessment: Overall WFL for tasks assessed       Cervical / Trunk Assessment Cervical / Trunk Assessment: Normal   Communication Communication Communication: No difficulties   Cognition Arousal/Alertness: Awake/alert Behavior During Therapy: WFL for tasks assessed/performed Overall Cognitive Status: Within Functional Limits for tasks assessed                     General Comments       Exercises       Shoulder Instructions      Home Living Family/patient expects to be discharged to:: Private residence Living Arrangements: Spouse/significant other Available Help at Discharge: Family;Available PRN/intermittently Type of Home: House       Home Layout: One level     Bathroom Shower/Tub: Producer, television/film/video: Standard     Home Equipment: None          Prior Functioning/Environment Level of  Independence: Independent        Comments: does all the cooking , does all the laundry and hangs clothes as a set outfit    OT Diagnosis:     OT Problem List:     OT Treatment/Interventions:      OT Goals(Current goals can be found in the care plan section) Acute Rehab OT Goals Patient Stated Goal: to go home  OT Frequency:     Barriers to D/C:            Co-evaluation              End of Session Equipment Utilized During Treatment: Gait belt;Oxygen  Activity Tolerance: Patient tolerated treatment well Patient left: Other (comment) (in hall with PT ashley)   Time: 1610-9604 OT Time Calculation (min): 23 min Charges:  OT General Charges $OT Visit: 1 Procedure OT Evaluation $OT Eval Moderate Complexity: 1 Procedure G-Codes:    Harolyn Rutherford 2015-08-04, 2:29 PM    Mateo Flow   OTR/L Pager: [phone] Office:  [phone] .

## 2015-07-24 NOTE — Progress Notes (Signed)
Physical Therapy Treatment Patient Details Name: Brenda [name] MRN: [id number] DOB: [birth year] Today's Date: 07/24/2015    History of Present Illness Brenda [name] is a 55 y.o. female with past medical history that includes chronic obstructive bronchitis, hypertension, obesity, bipolar disorder, presents to the emergency department with the chief complaint of persistent worsening shortness of breath. Evaluation in the emergency department reveals acute respiratory distress mild hypoxia due to COPD exacerbation.    PT Comments    Pt ambulated 175 ft w/ min guard assist as pt reaching out for wall railing once she begins to fatigue.  She requires 4 standing rest breaks due to dyspnea 4/4. Encouraged pt to utilize RW at home and when ambulating w/ nursing staff to assist w/ instability.   Follow Up Recommendations  No PT follow up;Supervision - Intermittent     Equipment Recommendations  None recommended by PT (pt says she has RW at home)    Recommendations for Other Services       Precautions / Restrictions Precautions Precautions: Other (comment) Precaution Comments: SOB; watch O2 Restrictions Weight Bearing Restrictions: No    Mobility  Bed Mobility               General bed mobility comments: Pt sitting in recliner chair upon PT arrival  Transfers Overall transfer level: Needs assistance Equipment used: None Transfers: Sit to/from Stand Sit to Stand: Supervision         General transfer comment: Able to stand w/o use of UEs and w/o cues or physical assist  Ambulation/Gait Ambulation/Gait assistance: Min guard Ambulation Distance (Feet): 175 Feet Assistive device: None Gait Pattern/deviations: Step-through pattern;Staggering right;Staggering left;Wide base of support   Gait velocity interpretation: Below normal speed for age/gender General Gait Details: SpO2 remains at 91% or above while ambulating; however, pt requires 4 standing rest breaks due to SOB 4/4.   Cues to stand upright and look ahead.  Begins reaching for railing on wall as she begins to fatigue.   Stairs            Wheelchair Mobility    Modified Rankin (Stroke Patients Only)       Balance Overall balance assessment: Needs assistance Sitting-balance support: No upper extremity supported;Feet supported Sitting balance-Leahy Scale: Normal     Standing balance support: No upper extremity supported;During functional activity Standing balance-Leahy Scale: Fair               High level balance activites: Sudden stops;Head turns;Other (comment) (stepping over object) High Level Balance Comments: LOB stepping over object but able to self correct    Cognition Arousal/Alertness: Awake/alert Behavior During Therapy: WFL for tasks assessed/performed Overall Cognitive Status: Within Functional Limits for tasks assessed                      Exercises Total Joint Exercises Marching in Standing: AROM;Both;10 reps;Standing;Other (comment) (w/ min guard assist) Other Exercises Other Exercises: sit<>stand w/o UE support x6    General Comments General comments (skin integrity, edema, etc.): Encouraged pt to use RW when at home or when ambulating w/ nursing staff.  Discussed the importance of taking breaks to catch her breath after ambulating 53ft at a time.      Pertinent Vitals/Pain Pain Assessment: No/denies pain    Home Living Family/patient expects to be discharged to:: Private residence Living Arrangements: Spouse/significant other Available Help at Discharge: Family;Available PRN/intermittently Type of Home: House     Home Layout: One level Home Equipment: None  Prior Function Level of Independence: Independent      Comments: does all the cooking , does all the laundry and hangs clothes as a set outfit   PT Goals (current goals can now be found in the care plan section) Acute Rehab PT Goals Patient Stated Goal: improve my breathing PT  Goal Formulation: With patient Time For Goal Achievement: 07/28/15 Potential to Achieve Goals: Good Progress towards PT goals: Progressing toward goals    Frequency  Min 2X/week    PT Plan Current plan remains appropriate    Co-evaluation             End of Session Equipment Utilized During Treatment: Oxygen;Gait belt Activity Tolerance: Other (comment) (limited by wheezing and SOB) Patient left: in chair;with call bell/phone within reach     Time: 1401-1433 PT Time Calculation (min) (ACUTE ONLY): 32 min  Charges:  $Gait Training: 8-22 mins $Therapeutic Exercise: 8-22 mins                    G Codes:      Michail Jewels PT, [place] [phone] Pager: [phone] 07/24/2015, 3:01 PM

## 2015-07-24 NOTE — Progress Notes (Signed)
[place] TEAM 1 - Stepdown/ICU TEAM Progress Note  Brenda [name] ZOX:[id number] DOB: 11-Apr-1961 DOA: 07/20/2015 PCP: [name], MD  Admit HPI / Brief Narrative: 55 y.o.WF PMHx Bipolar DO, Suicide Attempt, Migraines, COPD not on home O2, chronic obstructive bronchitis, tobacco abuse, HTN, Abnormal EKG,Obesity,  Presents to the emergency department with the chief complaint of persistent worsening shortness of breath. Evaluation in the emergency department reveals acute respiratory distress mild hypoxia due to COPD exacerbation.  Information obtained from the patient reports a 3 day history of gradual persistent worsening of shortness of breath and cough. She reports productive cough with thick white sputum. She states her home nebulizer and inhalers did not help. Associated symptoms include chills with subjective fever chest pain with coughing. She does not wear oxygen at home. She also reports she had an appointment with her pulmonologist last week had to cancel.  In the emergency department he is afebrile hemodynamically stable with an oxygen saturation level of 86% on room air after continuous nebs and Solu-Medrol.   HPI/Subjective: 2/22  A/O 4, sitting in chair. States has ambulated a little bit around room without DOE. Uses CPAP at night. Sees Dr. Sherene Sires and Dr. Craige Cotta PCCM  Assessment/Plan: Acute hypoxic respiratory failure - acute COPD exacerbation No focal infiltrate on chest x-ray to suggest pneumonia - influenza panel negative - cont usual medical tx - given her decline this morning, and the fact that she would be an exceptionally difficult intubation, I have asked PCCM to see her in consultation (she is followed in the office by Dr. Craige Cotta) -Continue Pulmicort nebulizer BID -Continue DuoNeb QID -Continue Solu-Medrol 60 mg TID -Titrate O2 to maintain SPO2 89 and 93% -To chair, ambulate as tolerated q shift -Ambulatory SPO2 -PT/OT consulted; CIR vs SNF  Hypertension -Blood  pressure currently well controlled  Lipomatous Hpertrophy Iteratrial Septum  -No action required in less patient begins to have arrhythmias.  Tobacco abuse -Counsel on absolute need to stop smoking permanently   Obstructive sleep apnea -CPAP daily at bedtime - followed by Dr. Craige Cotta  Morbid obesity - Body mass index is 49.3 kg/(m^2).   Bipolar disorder -On admission patient not on medication  Hyperglycemia -Not sure patient is true diabetic, most likely iatrogenic secondary to steroids -Obtain hemoglobin A1c -Moderate SSI    Code Status: FULL Family Communication: Husband present at time of exam Disposition Plan:  CIR vs SNF    Consultants: Rudi Heap [facility] M  Procedure/Significant Events: 2/21 Echocardiogram; - LVEF=60%-65%. - Atrial septum: increased thickness of the septum,c/w lipomatous hypertrophy.   Culture 2/18 MRSA by PCR negative 2/18 influenza A/B/H1N1 negative   Antibiotics: Azithromycin 2/18 >  DVT prophylaxis: Lovenox   Devices    LINES / TUBES:  NA    Continuous Infusions:   Objective: VITAL SIGNS: Temp: 97.8 F (36.6 C) (02/22 0746) Temp Source: Oral (02/22 0746) BP: 110/69 mmHg (02/22 1206) Pulse Rate: 69 (02/22 1206) SPO2; FIO2:   Intake/Output Summary (Last 24 hours) at 07/24/15 1237 Last data filed at 07/24/15 0800  Gross per 24 hour  Intake    480 ml  Output   1950 ml  Net  -1470 ml     Exam: General: A/O 4, sitting in chair, positive acute respiratory distress Eyes: Negative headache, negative scleral hemorrhage ENT: Negative Runny nose, negative gingival bleeding, Neck:  Negative scars, masses, torticollis, lymphadenopathy, JVD Lungs: poor air movement diffusely (slightly improved from 2/21), diffuse expiratory wheezing, negative crackles Cardiovascular:  Regular rhythm and  rate without murmur gallop or rub normal S1 and S2 Abdomen: morbidly obese,negative abdominal pain, nondistended, positive soft, bowel  sounds, no rebound, no ascites, no appreciable mass Extremities: No significant cyanosis, clubbing, or edema bilateral lower extremities Psychiatric:  Negative depression, negative anxiety, negative fatigue, negative mania  Neurologic:  Cranial nerves II through XII intact, tongue/uvula midline, all extremities muscle strength 5/5, sensation intact throughout,  negative dysarthria, negative expressive aphasia, negative receptive aphasia.   Data Reviewed: Basic Metabolic Panel:  Recent Labs Lab 07/20/15 1412 07/21/15 0415 07/22/15 0359 07/23/15 0451 07/24/15 0458  NA 140 140 137 138 137  K 4.1 4.9 4.5 4.6 4.4  CL 101 105 103 99* 97*  CO2 GLUCOSE 117* 254* 219* 190* 242*  BUN 12 15 22* 28* 29*  CREATININE 0.98 0.97 1.06* 0.98 0.95  CALCIUM 8.9 9.0 9.0 8.8* 8.7*  MG  --   --  2.4 2.3 2.5*  PHOS  --   --   --  4.2 3.9   Liver Function Tests:  Recent Labs Lab 07/20/15 1412  AST 30  ALT 27  ALKPHOS 94  BILITOT 0.2*  PROT 6.6  ALBUMIN 3.1*   No results for input(s): LIPASE, AMYLASE in the last 168 hours. No results for input(s): AMMONIA in the last 168 hours. CBC:  Recent Labs Lab 07/20/15 1412 07/21/15 0415  WBC 6.7 7.6  NEUTROABS 4.7  --   HGB 13.5 15.1*  HCT 41.9 45.2  MCV 90.7 91.3  PLT 225 242   Cardiac Enzymes: No results for input(s): CKTOTAL, CKMB, CKMBINDEX, TROPONINI in the last 168 hours. BNP (last 3 results)  Recent Labs  07/20/15 1412  BNP 15.4    ProBNP (last 3 results)  Recent Labs  04/01/15 1645  PROBNP 22.0    CBG: No results for input(s): GLUCAP in the last 168 hours.  Recent Results (from the past 240 hour(s))  MRSA PCR Screening     Status: None   Collection Time: 07/20/15  8:15 PM  Result Value Ref Range Status   MRSA by PCR NEGATIVE NEGATIVE Final    Comment:        The GeneXpert MRSA Assay (FDA approved for NASAL specimens only), is one component of a comprehensive MRSA colonization surveillance  program. It is not intended to diagnose MRSA infection nor to guide or monitor treatment for MRSA infections.      Studies:  Recent x-ray studies have been reviewed in detail by the Attending Physician  Scheduled Meds:  Scheduled Meds: . antiseptic oral rinse  7 mL Mouth Rinse BID  . budesonide (PULMICORT) nebulizer solution  0.5 mg Nebulization BID  . dextromethorphan  15 mg Oral BID  . enoxaparin (LOVENOX) injection  40 mg Subcutaneous Q24H  . feeding supplement (ENSURE ENLIVE)  237 mL Oral BID BM  . furosemide  40 mg Intravenous Q12H  . guaiFENesin  600 mg Oral BID  . insulin aspart  0-15 Units Subcutaneous 6 times per day  . ipratropium-albuterol  3 mL Nebulization QID  . loratadine  10 mg Oral Daily  . methylPREDNISolone (SOLU-MEDROL) injection  60 mg Intravenous 3 times per day    Time spent on care of this patient: 40 mins   Kashtyn Jankowski, Roselind Messier , MD  [facility] Office  [phone] Pager - [phone]  On-Call/Text Page:      [email or web address]      password [email or web address]  If 7PM-7AM, please contact night-coverage [email or web address] Password [facility] 07/24/2015,  12:37 PM   LOS: 4 days   Care during the described time interval was provided by me .  I have reviewed this patient's available data, including medical history, events of note, physical examination, and all test results as part of my evaluation. I have personally reviewed and interpreted all radiology studies.   Carolyne Littles, MD [phone] Pager

## 2015-07-24 NOTE — Progress Notes (Signed)
Patient ambulated 167ft on 6L of o2 with sats 85-90percent. Patient required breaks due to dyspnea. Patient now resting in chair with o2 sats at 94 % on 4 L o2. Will continue to monitor.

## 2015-07-24 NOTE — Progress Notes (Signed)
Inpatient Diabetes Program Recommendations  AACE/ADA: New Consensus Statement on Inpatient Glycemic Control (2015)  Target Ranges:  Prepandial:   less than 140 mg/dL      Peak postprandial:   less than 180 mg/dL (1-2 hours)      Critically ill patients:  140 - 180 mg/dL   Results for Brenda [name], Brenda [name] (MRN [id number]) as of 07/24/2015 10:20  Ref. Range 07/24/2015 04:58  Glucose Latest Ref Range: 65-99 mg/dL 308 (H)   Review of Glycemic Control  Diabetes history: None Current orders for Inpatient glycemic control: None  Inpatient Diabetes Program Recommendations: Correction (SSI): Lab glucose for the past 2 days 254, 219 mg/dl. Patient also on IV solumerdol 60 mg Q12hrs. While inpatient, please consider starting CBGS and Novolog Sensitive Correction to cover the steroid hyperglycemia.  Thanks,  Christena Deem RN, MSN, [facility] Inpatient Diabetes Coordinator Team Pager [phone] (8a-5p)

## 2015-07-25 DIAGNOSIS — J4520 Mild intermittent asthma, uncomplicated: Secondary | ICD-10-CM

## 2015-07-25 DIAGNOSIS — J4541 Moderate persistent asthma with (acute) exacerbation: Secondary | ICD-10-CM

## 2015-07-25 LAB — BASIC METABOLIC PANEL
Anion gap: 11 (ref 5–15)
BUN: 35 mg/dL — ABNORMAL HIGH (ref 6–20)
CO2: 32 mmol/L (ref 22–32)
Calcium: 8.8 mg/dL — ABNORMAL LOW (ref 8.9–10.3)
Chloride: 95 mmol/L — ABNORMAL LOW (ref 101–111)
Creatinine, Ser: 1.01 mg/dL — ABNORMAL HIGH (ref 0.44–1.00)
GFR calc Af Amer: >60 mL/min (ref >60)
GFR calc non Af Amer: >60 mL/min (ref >60)
Glucose, Bld: 189 mg/dL — ABNORMAL HIGH (ref 65–99)
Potassium: 5.2 mmol/L — ABNORMAL HIGH (ref 3.5–5.1)
Sodium: 138 mmol/L (ref 135–145)

## 2015-07-25 LAB — MAGNESIUM: Magnesium: 2.6 mg/dL — ABNORMAL HIGH (ref 1.7–2.4)

## 2015-07-25 LAB — GLUCOSE, CAPILLARY
Glucose-Capillary: 180 mg/dL — ABNORMAL HIGH (ref 65–99)
Glucose-Capillary: 187 mg/dL — ABNORMAL HIGH (ref 65–99)
Glucose-Capillary: 271 mg/dL — ABNORMAL HIGH (ref 65–99)
Glucose-Capillary: 280 mg/dL — ABNORMAL HIGH (ref 65–99)
Glucose-Capillary: 287 mg/dL — ABNORMAL HIGH (ref 65–99)
Glucose-Capillary: 311 mg/dL — ABNORMAL HIGH (ref 65–99)

## 2015-07-25 LAB — POTASSIUM: Potassium: 3.9 mmol/L (ref 3.5–5.1)

## 2015-07-25 LAB — PHOSPHORUS: Phosphorus: 3.6 mg/dL (ref 2.5–4.6)

## 2015-07-25 MED ORDER — PREDNISONE 20 MG PO TABS
20.0000 mg | ORAL_TABLET | Freq: Every day | ORAL | Status: DC
  Administered 2015-07-26: 20 mg via ORAL
  Filled 2015-07-25: qty 1

## 2015-07-25 MED ORDER — INSULIN GLARGINE 100 UNIT/ML ~~LOC~~ SOLN
8.0000 [IU] | Freq: Every day | SUBCUTANEOUS | Status: DC
  Administered 2015-07-25: 8 [IU] via SUBCUTANEOUS
  Filled 2015-07-25 (×2): qty 0.08

## 2015-07-25 MED ORDER — SODIUM POLYSTYRENE SULFONATE 15 GM/60ML PO SUSP: 30.0000 g | Freq: Once | ORAL | Status: DC

## 2015-07-25 MED ORDER — PHENOL 1.4 % MT LIQD
1.0000 | OROMUCOSAL | Status: DC | PRN
  Administered 2015-07-25: 1 via OROMUCOSAL
  Filled 2015-07-25: qty 177

## 2015-07-25 MED ORDER — IPRATROPIUM BROMIDE 0.02 % IN SOLN
0.5000 mg | Freq: Four times a day (QID) | RESPIRATORY_TRACT | Status: DC
  Administered 2015-07-25 – 2015-07-26 (×2): 0.5 mg via RESPIRATORY_TRACT
  Filled 2015-07-25 (×2): qty 2.5

## 2015-07-25 MED ORDER — METHYLPREDNISOLONE SODIUM SUCC 40 MG IJ SOLR: 40.0000 mg | Freq: Two times a day (BID) | INTRAMUSCULAR | Status: DC

## 2015-07-25 MED ORDER — ALBUTEROL SULFATE (2.5 MG/3ML) 0.083% IN NEBU: 2.5000 mg | INHALATION_SOLUTION | RESPIRATORY_TRACT | Status: DC | PRN

## 2015-07-25 MED ORDER — ALBUTEROL SULFATE (2.5 MG/3ML) 0.083% IN NEBU
2.5000 mg | INHALATION_SOLUTION | Freq: Four times a day (QID) | RESPIRATORY_TRACT | Status: DC
  Administered 2015-07-25 – 2015-07-26 (×2): 2.5 mg via RESPIRATORY_TRACT
  Filled 2015-07-25 (×2): qty 3

## 2015-07-25 MED ORDER — PHENOL 1.4 % MT LIQD: 1.0000 | OROMUCOSAL | Status: DC | PRN

## 2015-07-25 MED ORDER — INSULIN ASPART 100 UNIT/ML ~~LOC~~ SOLN
0.0000 [IU] | SUBCUTANEOUS | Status: DC
  Administered 2015-07-25 (×2): 11 [IU] via SUBCUTANEOUS
  Administered 2015-07-26 (×2): 3 [IU] via SUBCUTANEOUS

## 2015-07-25 MED ORDER — IPRATROPIUM-ALBUTEROL 0.5-2.5 (3) MG/3ML IN SOLN: 3.0000 mL | Freq: Three times a day (TID) | RESPIRATORY_TRACT | Status: DC

## 2015-07-25 MED ORDER — IPRATROPIUM-ALBUTEROL 0.5-2.5 (3) MG/3ML IN SOLN
3.0000 mL | Freq: Four times a day (QID) | RESPIRATORY_TRACT | Status: DC
  Administered 2015-07-25: 3 mL via RESPIRATORY_TRACT
  Filled 2015-07-25: qty 3

## 2015-07-25 MED ORDER — MENTHOL 3 MG MT LOZG
1.0000 | LOZENGE | OROMUCOSAL | Status: DC | PRN
  Filled 2015-07-25 (×2): qty 9

## 2015-07-25 MED ORDER — IPRATROPIUM-ALBUTEROL 0.5-2.5 (3) MG/3ML IN SOLN: 3.0000 mL | RESPIRATORY_TRACT | Status: DC | PRN

## 2015-07-25 NOTE — Care Management Note (Signed)
Case Management Note  Patient Details  Name: CRUZITA LIPA MRN: [id number] Date of Birth: [birth year]  Subjective/Objective:    Date: 06/24/15 Spoke with patient at the bedside.  Verified patient lives in town, with spouse, PTA indep. Has a rolling walker .  Expressed potential need for 3 n 1 and home oxygen, would like to use [facility]. Referral made to [facility] with [facility]  Verified patient anticipates to go home with family, at time of discharge and will have full-time supervision by family at this time to best of their knowledge. Patient denied needing help with their medication.  Patient drives   Or is driven by spouse to MD appointments.  Verified patient has PCP [name].   Plan: CM will continue to follow for discharge planning and [facility] resources.                 Action/Plan: Cont to wean oxygen  Expected Discharge Date:                  Expected Discharge Plan:  Home/Self Care  In-House Referral:     Discharge planning Services  CM Consult  Post Acute Care Choice:  Durable Medical Equipment Choice offered to:  Patient  DME Arranged:  3-N-1, Oxygen DME Agency:  [facility]  HH Arranged:    HH Agency:     Status of Service:  In process, will continue to follow  Medicare Important Message Given:    Date Medicare IM Given:    Medicare IM give by:    Date Additional Medicare IM Given:    Additional Medicare Important Message give by:     If discussed at Long Length of Stay Meetings, dates discussed:    Additional Comments:  Leone Haven, RN 07/25/2015, 3:34 PM

## 2015-07-25 NOTE — Progress Notes (Signed)
Inpatient Diabetes Program Recommendations  AACE/ADA: New Consensus Statement on Inpatient Glycemic Control (2015)  Target Ranges:  Prepandial:   less than 140 mg/dL      Peak postprandial:   less than 180 mg/dL (1-2 hours)      Critically ill patients:  140 - 180 mg/dL   Results for ELIKA, GODAR (MRN [id number]) as of 07/25/2015 14:06  Ref. Range 07/25/2015 07:38 07/25/2015 11:28  Glucose-Capillary Latest Ref Range: 65-99 mg/dL 132 (H) 440 (H)   Review of Glycemic Control  Diabetes history: None Current orders for Inpatient glycemic control: None  Inpatient Diabetes Program Recommendations: Insulin - Meal Coverage: If patient is eating >50% of meals consider Novolog 5 units of Meal Coverage TID.  Thanks,  Christena Deem RN, MSN, [facility] Inpatient Diabetes Coordinator Team Pager [phone] (8a-5p)

## 2015-07-25 NOTE — Progress Notes (Signed)
[facility] TEAM 1 - Stepdown/ICU TEAM Progress Note  Brenda [name]:[id number] DOB: 03-03-1961 DOA: 07/20/2015 PCP: [name], MD  Admit HPI / Brief Narrative: 55 y.o.WF PMHx Bipolar DO, Suicide Attempt, Migraines, COPD not on home O2, chronic obstructive bronchitis, tobacco abuse, HTN, Abnormal EKG,Obesity,  Presents to the emergency department with the chief complaint of persistent worsening shortness of breath. Evaluation in the emergency department reveals acute respiratory distress mild hypoxia due to COPD exacerbation.  Information obtained from the patient reports a 3 day history of gradual persistent worsening of shortness of breath and cough. She reports productive cough with thick white sputum. She states her home nebulizer and inhalers did not help. Associated symptoms include chills with subjective fever chest pain with coughing. She does not wear oxygen at home. She also reports she had an appointment with her pulmonologist last week had to cancel.  In the emergency department he is afebrile hemodynamically stable with an oxygen saturation level of 86% on room air after continuous nebs and Solu-Medrol.   HPI/Subjective: 2/23  A/O 4, sitting in bed. States has ambulated around ward (short circle) without DOE. Uses CPAP at night. Sees Dr. Sherene Sires and Dr. Craige Cotta PCCM  Assessment/Plan: Acute hypoxic respiratory failure - acute COPD exacerbation No focal infiltrate on chest x-ray to suggest pneumonia - influenza panel negative - cont usual medical tx - given her decline this morning, and the fact that she would be an exceptionally difficult intubation, I have asked PCCM to see her in consultation (she is followed in the office by Dr. Craige Cotta) -Pulmicort nebs BID; will change over to inhaler in a.m. -Atrovent + Albuterol inhaler QID -DC Solu-Medrol; start prednisone 20 mg daily  -Titrate O2 to maintain SPO2 89 and 93% -Ambulatory SPO2 -PT/OT recommend no  follow-up  Hypertension -Blood pressure currently well controlled  Lipomatous Hpertrophy Iteratrial Septum  -No action required in less patient begins to have arrhythmias.  Tobacco abuse -Counsel on absolute need to stop smoking permanently   Obstructive sleep apnea -CPAP daily at bedtime - followed by Dr. Craige Cotta  Morbid obesity - Body mass index is 49.3 kg/(m^2).   Bipolar disorder -On admission patient not on medication  Hyperglycemia -Not sure patient is true diabetic, most likely iatrogenic secondary to steroids -Obtain hemoglobin A1c -Increase to Resistant SSI -Start Lantus 8 units QHS  Hyperkalemia -Patient getting Lasix repeat potassium     Code Status: FULL Family Communication: None present at time of exam Disposition Plan:  DC in a.m.    Consultants: Rudi Heap [facility] M  Procedure/Significant Events: 2/21 Echocardiogram; - LVEF=60%-65%. - Atrial septum: increased thickness of the septum,c/w lipomatous hypertrophy.   Culture 2/18 MRSA by PCR negative 2/18 influenza A/B/H1N1 negative   Antibiotics: Azithromycin 2/18 > 2/22  DVT prophylaxis: Lovenox   Devices    LINES / TUBES:  NA    Continuous Infusions:   Objective: VITAL SIGNS: Temp: 97.7 F (36.5 C) (02/23 1100) Temp Source: Oral (02/23 1100) BP: 122/74 mmHg (02/23 0739) Pulse Rate: 67 (02/23 0739) SPO2; FIO2:   Intake/Output Summary (Last 24 hours) at 07/25/15 1514 Last data filed at 07/25/15 0800  Gross per 24 hour  Intake    720 ml  Output   1500 ml  Net   -780 ml     Exam: General: A/O 4, sitting in bed, positive acute respiratory distress Eyes: Negative headache, negative scleral hemorrhage ENT: Negative Runny nose, negative gingival bleeding, Neck:  Negative scars, masses, torticollis, lymphadenopathy, JVD  Lungs: poor air movement diffusely (improving), diffuse expiratory wheezing, negative crackles Cardiovascular:  Regular rhythm and rate without murmur  gallop or rub normal S1 and S2 Abdomen: morbidly obese,negative abdominal pain, nondistended, positive soft, bowel sounds, no rebound, no ascites, no appreciable mass Extremities: No significant cyanosis, clubbing, or edema bilateral lower extremities Psychiatric:  Negative depression, negative anxiety, negative fatigue, negative mania  Neurologic:  Cranial nerves II through XII intact, tongue/uvula midline, all extremities muscle strength 5/5, sensation intact throughout,  negative dysarthria, negative expressive aphasia, negative receptive aphasia.   Data Reviewed: Basic Metabolic Panel:  Recent Labs Lab 07/21/15 0415 07/22/15 0359 07/23/15 0451 07/24/15 0458 07/25/15 0440  NA 140 137 138 137 138  K 4.9 4.5 4.6 4.4 5.2*  CL 105 103 99* 97* 95*  CO2 32  GLUCOSE 254* 219* 190* 242* 189*  BUN 15 22* 28* 29* 35*  CREATININE 0.97 1.06* 0.98 0.95 1.01*  CALCIUM 9.0 9.0 8.8* 8.7* 8.8*  MG  --  2.4 2.3 2.5* 2.6*  PHOS  --   --  4.2 3.9 3.6   Liver Function Tests:  Recent Labs Lab 07/20/15 1412  AST 30  ALT 27  ALKPHOS 94  BILITOT 0.2*  PROT 6.6  ALBUMIN 3.1*   No results for input(s): LIPASE, AMYLASE in the last 168 hours. No results for input(s): AMMONIA in the last 168 hours. CBC:  Recent Labs Lab 07/20/15 1412 07/21/15 0415  WBC 6.7 7.6  NEUTROABS 4.7  --   HGB 13.5 15.1*  HCT 41.9 45.2  MCV 90.7 91.3  PLT 225 242   Cardiac Enzymes: No results for input(s): CKTOTAL, CKMB, CKMBINDEX, TROPONINI in the last 168 hours. BNP (last 3 results)  Recent Labs  07/20/15 1412  BNP 15.4    ProBNP (last 3 results)  Recent Labs  04/01/15 1645  PROBNP 22.0    CBG:  Recent Labs Lab 07/24/15 2024 07/25/15 07/25/15 0328 07/25/15 0738 07/25/15 1128  GLUCAP 330* 280* 187* 180* 311*    Recent Results (from the past 240 hour(s))  MRSA PCR Screening     Status: None   Collection Time: 07/20/15  8:15 PM  Result Value Ref Range Status   MRSA by  PCR NEGATIVE NEGATIVE Final    Comment:        The GeneXpert MRSA Assay (FDA approved for NASAL specimens only), is one component of a comprehensive MRSA colonization surveillance program. It is not intended to diagnose MRSA infection nor to guide or monitor treatment for MRSA infections.      Studies:  Recent x-ray studies have been reviewed in detail by the Attending Physician  Scheduled Meds:  Scheduled Meds: . antiseptic oral rinse  7 mL Mouth Rinse BID  . budesonide (PULMICORT) nebulizer solution  0.5 mg Nebulization BID  . dextromethorphan  15 mg Oral BID  . enoxaparin (LOVENOX) injection  40 mg Subcutaneous Q24H  . feeding supplement (ENSURE ENLIVE)  237 mL Oral BID BM  . furosemide  40 mg Intravenous Q12H  . guaiFENesin  600 mg Oral BID  . insulin aspart  0-15 Units Subcutaneous 6 times per day  . ipratropium-albuterol  3 mL Nebulization Q6H  . loratadine  10 mg Oral Daily  . methylPREDNISolone (SOLU-MEDROL) injection  40 mg Intravenous Q12H    Time spent on care of this patient: 40 mins   Wahneta Derocher, Roselind Messier , MD  [facility] Office  [phone] Pager [phone]  On-Call/Text Page:  [email or web address]      password [email or web address]  If 7PM-7AM, please contact night-coverage [email or web address] Password [facility] 07/25/2015, 3:14 PM   LOS: 5 days   Care during the described time interval was provided by me .  I have reviewed this patient's available data, including medical history, events of note, physical examination, and all test results as part of my evaluation. I have personally reviewed and interpreted all radiology studies.   Carolyne Littles, MD [phone] Pager

## 2015-07-25 NOTE — Progress Notes (Signed)
Name: Brenda [name] MRN: [id number] DOB: 1961-04-04    ADMISSION DATE:  07/20/2015 CONSULTATION DATE:  2/20  REFERRING MD :  Sharon Seller  CHIEF COMPLAINT:  Respiratory failure, COPD    SUBJECTIVE: SOB improved.  No acute events overnight.  Tolerating CPAP.  VITAL SIGNS: Temp:  [97.7 F (36.5 C)-98.1 F (36.7 C)] 97.7 F (36.5 C) (02/23 0739) Pulse Rate:  [63-95] 67 (02/23 0739) Resp:  [16-28] 20 (02/23 0739) BP: (110-159)/(67-106) 122/74 mmHg (02/23 0739) SpO2:  [90 %-98 %] 96 % (02/23 0852)  PHYSICAL EXAMINATION: General:  Obese female, mildly dyspneic at rest but no distress Neuro:  Awake, alert, appropriate, MAE  HEENT:  Mm moist, no JVD  Cardiovascular:  s1s2 rrr, SR on monitor  Lungs:  resps even, mildly labored, speaks in short sentences, diminished, few basilar crackles that clear with cough, few scattered wheezes  Abdomen:  Round, soft, +bs  Musculoskeletal:  Warm and dry, no edema   Recent Labs Lab 07/23/15 0451 07/24/15 0458 07/25/15 0440  NA 138 137 138  K 4.6 4.4 5.2*  CL 99* 97* 95*  CO2 28 30 32  BUN 28* 29* 35*  CREATININE 0.98 0.95 1.01*  GLUCOSE 190* 242* 189*    Recent Labs Lab 07/20/15 1412 07/21/15 0415  HGB 13.5 15.1*  HCT 41.9 45.2  WBC 6.7 7.6  PLT 225 242   No results found.  SIGNIFICANT EVENTS  2/18  Admit with SOB   STUDIES:  Flu PCR 2/18 >> NEG    ASSESSMENT / PLAN:  DISCUSSION: 55 y/o female active smoker with hx COPD, OSA on CPAP, obesity, HTN, bipolar disorder who was initially admitted 2/18 with AECOPD.  CXR clear and flu panel negative.  She was admitted by [facility] and started on BD's, Abx and IV steroids.  Despite this, on 2/20 she had progressive SOB and increased WOB and PCCM consulted to assist.    Acute Hypoxic Respiratory Failure - in setting of COPD exacerbation, flu negative.  Repeat CXR 2/20 with ? Left basilar opacity but not significantly different than 2/18 imaging, suspect atelectasis given no fever / WBC  or clinical PNA. AECOPD. Ongoing Tobacco Abuse. OSA. PLAN:  Wean solumedrol - would convert to PO pred over next day . Continue QHS CPAP. Duonebs q6, budesonide BID, albuterol PRN. Pulmonary hygiene - IS / Flutter, mobilize. Smoking cessation encouraged. Continue delsym, mucinex, claritin, lasix. CXR intermittently. Follow up with pulmonology as outpatient.  Rest per primary team.  Nothing further to add at this point.  Continue above, wean steroids and convert to PO pred taper.  Ensure follow up appointment with pulm post discharge.  PCCM will sign off.  Please do not hesitate to call us back if we can be of any further assistance.    Rutherford Guys, [place] [facility] Pager: [phone]  or [phone] 07/25/2015, 11:22 AM  STAFF NOTE: I, Rory Percy, MD FACP have personally reviewed patient's available data, including medical history, events of note, physical examination and test results as part of my evaluation. I have discussed with resident/NP and other care providers such as pharmacist, RN and RRT. In addition, I personally evaluated patient and elicited key findings of: tolerated CPAP, had pos balance last 24 hrs, consider continued even to slight neg balance, transition off IV steroids, maintain current inhaled regimen, requires follow up in pulm clinic, consider pcxr follow up pre dc for int changes with continued neg  balance  Mcarthur Rossetti. Tyson Alias, MD, FACP Pgr: [phone] [facility] 07/25/2015 2:23 PM

## 2015-07-25 NOTE — Progress Notes (Signed)
RT placed patient on CPAP HS. Patient placed on 8. 4L O2 bleed in. Patient tolerating well. RT will continue to monitor as needed.

## 2015-07-26 ENCOUNTER — Telehealth: Payer: Self-pay | Admitting: Family Medicine

## 2015-07-26 DIAGNOSIS — E118 Type 2 diabetes mellitus with unspecified complications: Secondary | ICD-10-CM

## 2015-07-26 DIAGNOSIS — E1142 Type 2 diabetes mellitus with diabetic polyneuropathy: Secondary | ICD-10-CM | POA: Diagnosis present

## 2015-07-26 DIAGNOSIS — E1169 Type 2 diabetes mellitus with other specified complication: Secondary | ICD-10-CM | POA: Diagnosis present

## 2015-07-26 DIAGNOSIS — E119 Type 2 diabetes mellitus without complications: Secondary | ICD-10-CM | POA: Diagnosis present

## 2015-07-26 LAB — BASIC METABOLIC PANEL
Anion gap: 13 (ref 5–15)
BUN: 34 mg/dL — ABNORMAL HIGH (ref 6–20)
CO2: 30 mmol/L (ref 22–32)
Calcium: 8.6 mg/dL — ABNORMAL LOW (ref 8.9–10.3)
Chloride: 92 mmol/L — ABNORMAL LOW (ref 101–111)
Creatinine, Ser: 0.97 mg/dL (ref 0.44–1.00)
GFR calc Af Amer: >60 mL/min (ref >60)
GFR calc non Af Amer: >60 mL/min (ref >60)
Glucose, Bld: 181 mg/dL — ABNORMAL HIGH (ref 65–99)
Potassium: 4.3 mmol/L (ref 3.5–5.1)
Sodium: 135 mmol/L (ref 135–145)

## 2015-07-26 LAB — HEMOGLOBIN A1C
Hgb A1c MFr Bld: 7.2 % — ABNORMAL HIGH (ref 4.8–5.6)
Mean Plasma Glucose: 160 mg/dL

## 2015-07-26 LAB — GLUCOSE, CAPILLARY
Glucose-Capillary: 105 mg/dL — ABNORMAL HIGH (ref 65–99)
Glucose-Capillary: 145 mg/dL — ABNORMAL HIGH (ref 65–99)
Glucose-Capillary: 150 mg/dL — ABNORMAL HIGH (ref 65–99)

## 2015-07-26 LAB — MAGNESIUM: Magnesium: 2.5 mg/dL — ABNORMAL HIGH (ref 1.7–2.4)

## 2015-07-26 LAB — PHOSPHORUS: Phosphorus: 3.4 mg/dL (ref 2.5–4.6)

## 2015-07-26 MED ORDER — METFORMIN HCL 500 MG PO TABS
500.0000 mg | ORAL_TABLET | Freq: Two times a day (BID) | ORAL | Status: DC
Start: 2015-07-26 — End: 2015-08-23

## 2015-07-26 MED ORDER — TIOTROPIUM BROMIDE MONOHYDRATE 18 MCG IN CAPS
18.0000 ug | ORAL_CAPSULE | Freq: Every day | RESPIRATORY_TRACT | Status: DC
  Filled 2015-07-26: qty 5

## 2015-07-26 MED ORDER — NYSTATIN 100000 UNIT/ML MT SUSP: 5.0000 mL | Freq: Three times a day (TID) | OROMUCOSAL | Status: DC

## 2015-07-26 MED ORDER — ALBUTEROL SULFATE 108 (90 BASE) MCG/ACT IN AEPB: 2.0000 | INHALATION_SPRAY | Freq: Four times a day (QID) | RESPIRATORY_TRACT | Status: DC | PRN

## 2015-07-26 MED ORDER — GUAIFENESIN ER 600 MG PO TB12: 600.0000 mg | ORAL_TABLET | Freq: Two times a day (BID) | ORAL | Status: DC

## 2015-07-26 MED ORDER — TIOTROPIUM BROMIDE MONOHYDRATE 18 MCG IN CAPS: 18.0000 ug | ORAL_CAPSULE | Freq: Every day | RESPIRATORY_TRACT | Status: DC

## 2015-07-26 MED ORDER — BUDESONIDE-FORMOTEROL FUMARATE 160-4.5 MCG/ACT IN AERO: 2.0000 | INHALATION_SPRAY | Freq: Two times a day (BID) | RESPIRATORY_TRACT | Status: DC

## 2015-07-26 NOTE — Progress Notes (Signed)
Results for KENIDEE, CREGAN (MRN [id number]) as of 07/26/2015 09:21  Ref. Range 07/25/2015 16:17 07/25/2015 19:46 07/25/2015 23:57 07/26/2015 04:25 07/26/2015 07:28  Glucose-Capillary Latest Ref Range: 65-99 mg/dL 952 (H) 841 (H) 324 (H) 150 (H) 145 (H)  Noted that HgbA1C is 7.2%. No home DM meds noted. Will continue to follow while in hospital. Smith Mince RN BSN CDE

## 2015-07-26 NOTE — Progress Notes (Signed)
SATURATION QUALIFICATIONS: (This note is used to comply with regulatory documentation for home oxygen)  Patient Saturations on Room Air at Rest = 88%  Patient Saturations on Room Air while Ambulating = %  Patient Saturations on  Liters of oxygen while Ambulating = %  Please briefly explain why patient needs home oxygen: 

## 2015-07-26 NOTE — Care Management Note (Signed)
Case Management Note  Patient Details  Name: Brenda [name] MRN: [id number] Date of Birth: 07-30-1960  Subjective/Objective:  Patient discharged home  With home oxygen with [facility], [place] brought up to patient's room.  No other needs.                    Action/Plan:   Expected Discharge Date:                  Expected Discharge Plan:  Home/Self Care  In-House Referral:     Discharge planning Services  CM Consult  Post Acute Care Choice:  Durable Medical Equipment Choice offered to:  Patient  DME Arranged:  3-N-1, Oxygen DME Agency:  [facility]  HH Arranged:    HH Agency:     Status of Service:  Completed, signed off  Medicare Important Message Given:    Date Medicare IM Given:    Medicare IM give by:    Date Additional Medicare IM Given:    Additional Medicare Important Message give by:     If discussed at Long Length of Stay Meetings, dates discussed:    Additional Comments:  Leone Haven, RN 07/26/2015, 11:38 AM

## 2015-07-26 NOTE — Telephone Encounter (Signed)
Patient Name: Brenda [name]  DOB: 04/14/1961    Initial Comment Caller states she has been in hospital for COPD, came home today. Now has thrush in mouth. Wants to know if mouth wash can be called.   Nurse Assessment  Nurse: Stefano Gaul, RN, Dwana Curd Date/Time (Eastern Time): 07/26/2015 1:41:09 PM  Confirm and document reason for call. If symptomatic, describe symptoms. You must click the next button to save text entered. ---Caller states she was hospitalized with COPD and came home today. She has thrush from receiving so many breathing treatments. She has white patches on her tongue and on the sides of her mouth. No pain.  Has the patient traveled out of the country within the last 30 days? ---Not Applicable  Does the patient have any new or worsening symptoms? ---Yes  Will a triage be completed? ---Yes  Related visit to physician within the last 2 weeks? ---No  Does the PT have any chronic conditions? (i.e. diabetes, asthma, etc.) ---Yes  List chronic conditions. ---COPD; diabetes  Is this a behavioral health or substance abuse call? ---No     Guidelines    Guideline Title Affirmed Question Affirmed Notes  Mouth Symptoms White patches that stick to tongue or inner cheek    Final Disposition User   See PCP When Office is Open (within 3 days) Stefano Gaul, Charity fundraiser, Dwana Curd    Comments  pt states she does not want to make appt as she just got home from the hospital but she would like prescription called in for Nystatin as she has had thrush before and she has been prescribed that medication before. Please call pt back and let her know if Nystatin will be called in.   Disagree/Comply: Disagree  Disagree/Comply Reason: Disagree with instructions

## 2015-07-26 NOTE — Progress Notes (Signed)
RT went to put patient on CPAP. Patient was already on CPAP resting. 4L O2 bleed in. RT will continue to monitor as needed.

## 2015-07-26 NOTE — Progress Notes (Signed)
Patient and her husband instructed on use of portable home oxygen equipment.  All questions answered to their expressed satisfaction.

## 2015-07-26 NOTE — Telephone Encounter (Signed)
Pt contacted office back and was advised per Lyla Son, Rx sent to pharmacy

## 2015-07-26 NOTE — Telephone Encounter (Signed)
Attempted to contact pt. Line picked up and d/c. Line busy on second attempt

## 2015-07-26 NOTE — Discharge Summary (Signed)
Physician Discharge Summary  Brenda [name] NFA:[id number] DOB: September 02, 1960 DOA: 07/20/2015  PCP: [name], MD  Admit date: 07/20/2015 Discharge date: 07/26/2015  Time spent: 35 minutes  Recommendations for Outpatient Follow-up:  Acute hypoxic respiratory failure - acute COPD exacerbation No focal infiltrate on chest x-ray to suggest pneumonia - influenza panel negative - cont usual medical tx - given her decline this morning, and the fact that she would be an exceptionally difficult intubation, I have asked PCCM to see her in consultation (she is followed in the office by Dr. Craige Cotta) -Pulmicort nebs BID; will change over to inhaler in a.m. -Atrovent + Albuterol inhaler QID -DC Solu-Medrol; start prednisone 20 mg daily  -Titrate O2 to maintain SPO2 89 and 93% -Ambulatory SPO2 -PT/OT recommend no follow-up -Follow-up in one week with Dr. Craige Cotta or Dr. Sherene Sires [facility] M acute respiratory failure with hypoxia 2dary COPD exacerbation -Instructed to restart Spiriva  -Patient was on Symbicort 160-4.5 g BID. Instructed to restart  SATURATION QUALIFICATIONS: (This note is used to comply with regulatory documentation for home oxygen) Patient Saturations on Room Air at Rest = 88% Patient Saturations on Room Air while Ambulating =85% Patient Saturations on 3Liters of oxygen while Ambulating = 92%  Please briefly explain why patient needs home oxygen: -Patient instructed to use 3 L O2 via [place] 24 hours a day.   Hypertension -Blood pressure currently well controlled, will not restart previous BP medication  Lipomatous Hpertrophy Iteratrial Septum  -No action required in less patient begins to have arrhythmias.  Tobacco abuse -Counsel on absolute need to stop smoking permanently  -Schedule appointment for follow-up in 1- 2 weeks with Dr.Javier Sharen Hones for acute respiratory failure, tobacco abuse, OSA  Obstructive sleep apnea -CPAP daily at bedtime - followed by Dr. Craige Cotta  Morbid obesity - Body  mass index is 49.3 kg/(m^2).   Bipolar disorder -On admission patient not on medication  Diabetes type 2 with complications -Not sure patient is true diabetic, most likely iatrogenic secondary to steroids -2/23 hemoglobin A1c = 7.2  -On admission patient was not on diabetic medication will start patient on metformin 500 mg BID, and let her PCP titrate to effect  Hyperkalemia -Resolved          Discharge Diagnoses:  Principal Problem:   Acute respiratory failure with hypoxia (HCC) Active Problems:   Morbid obesity (HCC)   Bipolar I disorder, most recent episode (or current) manic (HCC)   HYPERTENSION, BENIGN ESSENTIAL   Asthmatic bronchitis   COPD exacerbation (HCC)   OSA (obstructive sleep apnea)   Asthma   Hyperglycemia   Tobacco abuse   Type 2 diabetes mellitus with complication, without long-term current use of insulin (HCC)   Discharge Condition: Stable  Diet recommendation: American diabetic Association   Filed Weights   07/20/15 1559 07/20/15 2013  Weight: 128.822 kg (284 lb) 126.2 kg (278 lb 3.5 oz)    History of present illness:  55 y.o.WF PMHx Bipolar DO, Suicide Attempt, Migraines, COPD not on home O2, chronic obstructive bronchitis, tobacco abuse, HTN, Abnormal EKG,Obesity,  Presents to the emergency department with the chief complaint of persistent worsening shortness of breath. Evaluation in the emergency department reveals acute respiratory distress mild hypoxia due to COPD exacerbation.  Information obtained from the patient reports a 3 day history of gradual persistent worsening of shortness of breath and cough. She reports productive cough with thick white sputum. She states her home nebulizer and inhalers did not help. Associated symptoms include chills with subjective  fever chest pain with coughing. She does not wear oxygen at home. She also reports she had an appointment with her pulmonologist last week had to cancel.  In the emergency  department he is afebrile hemodynamically stable with an oxygen saturation level of 86% on room air after continuous nebs and Solu-Medrol. During his hospitalization patient was treated for acute respiratory failure with hypoxia secondary to a severe COPD exacerbation. Patient has responded well to current treatment. Will need to have close follow-up with her PCCM physicians Dr. Craige Cotta and Dr. Sherene Sires. Patient is also now a newly diagnosed diabetic     Consultants: Rudi Heap [facility]  Procedure/Significant Events: 2/21 Echocardiogram; - LVEF=60%-65%. - Atrial septum: increased thickness of the septum,c/w lipomatous hypertrophy.   Culture 2/18 MRSA by PCR negative 2/18 influenza A/B/H1N1 negative   Antibiotics: Azithromycin 2/18 > 2/22   Discharge Exam: Filed Vitals:   07/25/15 2356 07/26/15 0425 07/26/15 0820 07/26/15 0824  BP: 159/98 164/108 147/93 147/93  Pulse: 68 84 86 86  Temp: 97.6 F (36.4 C) 97.3 F (36.3 C) 97.6 F (36.4 C)   TempSrc: Oral Oral Oral   Resp: Height:      Weight:      SpO2: 96% 92% 99% 96%    General: A/O 4, sitting in bed, positive acute respiratory distress Eyes: Negative headache, negative scleral hemorrhage ENT: Negative Runny nose, negative gingival bleeding, Neck: Negative scars, masses, torticollis, lymphadenopathy, JVD Lungs: poor air movement diffusely (improving), diffuse expiratory wheezing, negative crackles Cardiovascular: Regular rhythm and rate without murmur gallop or rub normal S1 and S2   Discharge Instructions     Medication List    STOP taking these medications        hydrochlorothiazide 25 MG tablet  Commonly known as:  HYDRODIURIL     levalbuterol 1.25 MG/3ML nebulizer solution  Commonly known as:  XOPENEX     valsartan 160 MG tablet  Commonly known as:  DIOVAN      TAKE these medications        Albuterol Sulfate 108 (90 Base) MCG/ACT Aepb  Commonly known as:  PROAIR RESPICLICK  Inhale 2  puffs into the lungs every 6 (six) hours as needed. Dyspnea/wheeze     budesonide-formoterol 160-4.5 MCG/ACT inhaler  Commonly known as:  SYMBICORT  Inhale 2 puffs into the lungs 2 (two) times daily.     cetirizine 10 MG tablet  Commonly known as:  ZYRTEC  Take 10 mg by mouth daily as needed for allergies.     guaiFENesin 600 MG 12 hr tablet  Commonly known as:  MUCINEX  Take 1 tablet (600 mg total) by mouth 2 (two) times daily.     metFORMIN 500 MG tablet  Commonly known as:  GLUCOPHAGE  Take 1 tablet (500 mg total) by mouth 2 (two) times daily with a meal.     tiotropium 18 MCG inhalation capsule  Commonly known as:  SPIRIVA  Place 1 capsule (18 mcg total) into inhaler and inhale daily.       Allergies  Allergen Reactions  . Metolazone     REACTION: metabolic mood swings   Follow-up Information    Follow up with Sandrea Hughs, MD. Go on 07/30/2015.   Specialty:  Pulmonary Disease   Why:  Follow-up with Dr. Sherene Sires [facility] on 07/30/15 at 2:45pm for acute respiratory failure with hypoxia 2dary COPD exacerbation   Contact information:   [address] [place] [address]  [phone]       Follow up with [name], MD. Go on 08/05/2015.   Specialty:  Family Medicine   Why:  Follow-up with Dr. [name] on 08/05/15 at 2:15pm for acute respiratory failure, tobacco abuse, OSA   Contact information:   [address] [place] [address] [phone]        The results of significant diagnostics from this hospitalization (including imaging, microbiology, ancillary and laboratory) are listed below for reference.    Significant Diagnostic Studies: Dg Chest Portable 1 View  07/22/2015  CLINICAL DATA:  Smoker, asthma, COPD.  Respiratory failure EXAM: PORTABLE CHEST 1 VIEW COMPARISON:  07/20/2015 FINDINGS: Heart is normal size. Left basilar opacity could reflect pneumonia. Right lung is clear. Mild peribronchial thickening. No effusions or acute bony  abnormality. IMPRESSION: Mild bronchitic changes. Left basilar opacity. Cannot exclude pneumonia. Electronically Signed   By: Charlett Nose M.D.   On: 07/22/2015 10:04   Dg Chest Port 1 View  07/20/2015  CLINICAL DATA:  Shortness of breath EXAM: PORTABLE CHEST 1 VIEW COMPARISON:  03/25/15 chest radiograph. FINDINGS: Stable cardiomediastinal silhouette with normal heart size. No pneumothorax. No pleural effusion. Lungs appear clear, with no acute consolidative airspace disease and no pulmonary edema. IMPRESSION: No active disease. Electronically Signed   By: Delbert Phenix M.D.   On: 07/20/2015 12:58    Microbiology: Recent Results (from the past 240 hour(s))  MRSA PCR Screening     Status: None   Collection Time: 07/20/15  8:15 PM  Result Value Ref Range Status   MRSA by PCR NEGATIVE NEGATIVE Final    Comment:        The GeneXpert MRSA Assay (FDA approved for NASAL specimens only), is one component of a comprehensive MRSA colonization surveillance program. It is not intended to diagnose MRSA infection nor to guide or monitor treatment for MRSA infections.      Labs: Basic Metabolic Panel:  Recent Labs Lab 07/22/15 0359 07/23/15 0451 07/24/15 0458 07/25/15 0440 07/25/15 1907 07/26/15 0540  NA 137 138 137 138  --  135  K 4.5 4.6 4.4 5.2* 3.9 4.3  CL 103 99* 97* 95*  --  92*  CO2 32  --  30  GLUCOSE 219* 190* 242* 189*  --  181*  BUN 22* 28* 29* 35*  --  34*  CREATININE 1.06* 0.98 0.95 1.01*  --  0.97  CALCIUM 9.0 8.8* 8.7* 8.8*  --  8.6*  MG 2.4 2.3 2.5* 2.6*  --  2.5*  PHOS  --  4.2 3.9 3.6  --  3.4   Liver Function Tests:  Recent Labs Lab 07/20/15 1412  AST 30  ALT 27  ALKPHOS 94  BILITOT 0.2*  PROT 6.6  ALBUMIN 3.1*   No results for input(s): LIPASE, AMYLASE in the last 168 hours. No results for input(s): AMMONIA in the last 168 hours. CBC:  Recent Labs Lab 07/20/15 1412 07/21/15 0415  WBC 6.7 7.6  NEUTROABS 4.7  --   HGB 13.5 15.1*  HCT  41.9 45.2  MCV 90.7 91.3  PLT 225 242   Cardiac Enzymes: No results for input(s): CKTOTAL, CKMB, CKMBINDEX, TROPONINI in the last 168 hours. BNP: BNP (last 3 results)  Recent Labs  07/20/15 1412  BNP 15.4    ProBNP (last 3 results)  Recent Labs  04/01/15 1645  PROBNP 22.0    CBG:  Recent Labs Lab 07/25/15 1617 07/25/15 1946 07/25/15 2357 07/26/15 0425  07/26/15 0728  GLUCAP 287* 271* 105* 150* 145*       Signed:  Carolyne Littles, MD [facility] [phone] pager

## 2015-07-26 NOTE — Telephone Encounter (Signed)
plz notify nystatin sent to [facility].

## 2015-07-26 NOTE — Progress Notes (Signed)
After patient completed her breakfast we walked from her room to the nurses station at the opposite end of the hall.  I placed her on 3 l/min oxygen flow by rolling oxygen canister.  We used a front wheeled walker for increased ambulation stability.   Initially she was 92% when standing before the walk began.  She used pursed lip breathing throughout the walk.  Her gait was steady throughout.  At the conclusion of the walk just before re-entering her room, her heart rate had climbed to 126 bpm in a sinus tach rhythm.  Her respiratory rate was 26, and her oxygen sats were 85% with a correlating wave form.  I placed her back on humidified wall oxygen and increased the flow rate to 3 l/min. At 0910 her RR was 21 and sats 92%.  I reduced her oxygen back to 2 l/min.

## 2015-07-29 ENCOUNTER — Telehealth: Payer: Self-pay

## 2015-07-29 NOTE — Telephone Encounter (Signed)
Michalle Rademaker spouse [phone]  Jillyn Hidden dropped off some FMLA paperwork to be filled out, he stated that Labresha had been in the hospital. - Placed in RX box at front

## 2015-07-30 ENCOUNTER — Ambulatory Visit (INDEPENDENT_AMBULATORY_CARE_PROVIDER_SITE_OTHER): Payer: Managed Care, Other (non HMO) | Admitting: Internal Medicine

## 2015-07-30 ENCOUNTER — Encounter: Payer: Self-pay | Admitting: Internal Medicine

## 2015-07-30 VITALS — BP 136/82 | HR 78 | Ht 63.0 in | Wt 266.0 lb

## 2015-07-30 DIAGNOSIS — J4530 Mild persistent asthma, uncomplicated: Secondary | ICD-10-CM | POA: Diagnosis not present

## 2015-07-30 DIAGNOSIS — J9611 Chronic respiratory failure with hypoxia: Secondary | ICD-10-CM

## 2015-07-30 NOTE — Telephone Encounter (Signed)
plz obtain better copy to fill out as this will not scan well.

## 2015-07-30 NOTE — Progress Notes (Signed)
Subjective:   Patient ID: Brenda [name], female    DOB: 26-Jul-1960    MRN: [id number]   Brief patient profile:  55yowf quit smoking 07/2015  at wt 250 and admitted with" aecopd" but with pfts nearly nl 2/29/15 p rx so characterized as AB not copd    Admit date: 05/30/2013  Discharge date: 06/02/2013  HYPERTRIGLYCERIDEMIA  LEUKOCYTOSIS UNSPECIFIED  BIPOLAR AFFECTIVE DISORDER, MANIC  NICOTINE ADDICTION  HYPERTENSION, BENIGN ESSENTIAL  BRONCHITIS, OBSTRUCTIVE CHRONIC  EMPHYSEMA  REACTIVE AIRWAY DISEASE  COPD exacerbation  Chronic pain syndrome  Migraine, unspecified, without mention of intractable migraine without mention of status migrainosus   History of Present Illness  09/22/2013 1st [facility] office visit/ [facility]  Chief Complaint  Patient presents with  . Pulmonary Consult    Referred per [facility]. Pt c/o SOB since Dec 2014, but has been worse since Jan 2015. She reports that she gets out of breath walking just a few steps and wakes up out of breath 3-4 times every night. She also c/o cough that she states started "all the time"- non prod.   onset was abrupt but pattern has been progressively worse with minimal transient benefit from prednisone for up to week then back on again and attributes wt gain to steroids x around 50 lbs.  Cough is harsh barking. Sob no better from albterol, says xopenex works better   rec Stop advair, cozar and spiriva  xopenex 1.25 mg 4 x daily Prilosec 20 mg x 2 =  40 mg   Take 30-60 min before first meal of the day and Pepcid 20 mg one bedtime until return to office - this is the best way to tell whether stomach acid is contributing to your problem.  GERD diet  Diovan (losartan)160 one daily is your new blood pressure pill  For cough > use flutter valve and Take delsym two tsp every 12 hours and supplement if needed with  tramadol 50 mg up to 2 every 4 hours to suppress the urge to cough    03/25/15  rec Stop Dulera  Restart Symbicort  2 puffs Twice daily   Restart Spiriva 1 puff daily .  Work on not smoking.  Keep up good work with CPAP At bedtime     Admit date: 07/20/2015 Discharge date: 07/26/2015 Recommendations for Outpatient Follow-up:  Acute hypoxic respiratory failure - acute COPD exacerbation No focal infiltrate on chest x-ray to suggest pneumonia - influenza panel negative - cont usual medical tx - given her decline this morning, and the fact that she would be an exceptionally difficult intubation, I have asked PCCM to see her in consultation (she is followed in the office by Dr. Craige Cotta) -Pulmicort nebs BID; will change over to inhaler in a.m. -Atrovent + Albuterol inhaler QID -DC Solu-Medrol; start prednisone 20 mg daily  -Titrate O2 to maintain SPO2 89 and 93% -Ambulatory SPO2 -PT/OT recommend no follow-up -Follow-up in one week with Dr. Craige Cotta or Dr. Sherene Sires [facility] M acute respiratory failure with hypoxia 2dary COPD exacerbation -Instructed to restart Spiriva  -Patient was on Symbicort 160-4.5 g BID. Instructed to restart  SATURATION QUALIFICATIONS: (This note is used to comply with regulatory documentation for home oxygen) Patient Saturations on Room Air at Rest = 88% Patient Saturations on Room Air while Ambulating =85% Patient Saturations on 3Liters of oxygen while Ambulating = 92%  Please briefly explain why patient needs home oxygen: -Patient instructed to use 3 L O2 via [place] 24 hours a day.  Hypertension -Blood pressure currently well controlled, will not restart previous BP medication  Lipomatous Hpertrophy Iteratrial Septum  -No action required in less patient begins to have arrhythmias.  Tobacco abuse -Counsel on absolute need to stop smoking permanently  -Schedule appointment for follow-up in 1- 2 weeks with Dr.Javier Sharen Hones for acute respiratory failure, tobacco abuse, OSA  Obstructive sleep apnea -CPAP daily at bedtime - followed by Dr. Craige Cotta  Morbid obesity - Body mass index is 49.3  kg/(m^2).   Bipolar disorder -On admission patient not on medication  Diabetes type 2 with complications -Not sure patient is true diabetic, most likely iatrogenic secondary to steroids -2/23 hemoglobin A1c = 7.2  -On admission patient was not on diabetic medication will start patient on metformin 500 mg BID, and let her PCP titrate to effect  Hyperkalemia -Resolved    07/30/2015  f/u ov/Wert re:  AB / quit smoking 07/17/2015  Chief Complaint  Patient presents with  . HFU    Pt states that her breathing has improved some. She c/o nasal congestion and ears feeling stopped up.  She has not had bloody nasal d/c.   Last time could walk into work one year prior to OV   Using 02 nasal and cpap f/u sood for June 2017  Feels 02 is irritating nose/ causing obstruction    No obvious  Other day to day or daytime variabilty or assoc excess/ purulent sputum or mucus plugs  or  cp or chest tightness, subjective wheeze overt sinus or hb symptoms. No unusual exp hx or h/o childhood pna/ asthma or knowledge of premature birth.  Sleeping ok without nocturnal  or early am exacerbation  of respiratory  c/o's or need for noct saba. Also denies any obvious fluctuation of symptoms with weather or environmental changes or other aggravating or alleviating factors except as outlined above   Current Medications, Allergies, Complete Past Medical History, Past Surgical History, Family History, and Social History were reviewed in [employer] record.  ROS  The following are not active complaints unless bolded sore throat, dysphagia, dental problems, itching, sneezing,  nasal congestion or excess/ purulent secretions, ear ache,   fever, chills, sweats, unintended wt loss, pleuritic or exertional cp, hemoptysis,  orthopnea pnd or leg swelling, presyncope, palpitations, heartburn, abdominal pain, anorexia, nausea, vomiting, diarrhea  or change in bowel or urinary habits, change in stools or urine,  dysuria,hematuria,  rash, arthralgias, visual complaints, headache, numbness weakness or ataxia or problems with walking or coordination,  change in mood/affect or memory.               Objective:   Physical Exam    obese wf nad   10/06/2013         288 > 11/17/2013  288> 07/30/2015  266      09/22/13 293 lb 12.8 oz (133.267 kg)  09/18/13 289 lb 4 oz (131.203 kg)  08/21/13 283 lb 8 oz (128.595 kg)      HEENT: nl   turbinates, and orophanx. Nl external ear canals without cough reflex - partial top dentures    NECK :  without JVD/Nodes/TM/ nl carotid upstrokes bilaterally   LUNGS: no acc muscle use, clear to A and P bilaterally without cough on insp or exp maneuvers   CV:  RRR  no s3 or murmur or increase in P2, no edema   ABD:  Massively obese but soft and nontender with nl excursion in the supine position. No bruits or organomegaly, bowel  sounds nl  MS:  warm without deformities, calf tenderness, cyanosis or clubbing  SKIN: warm and dry without lesions    NEURO:  Very anxious/  No deficits      I personally reviewed images and agree with radiology impression as follows:  pCXR:  07/22/15 Mild bronchitic changes. Left basilar opacity. Cannot exclude pneumonia.       Assessment & Plan:

## 2015-07-30 NOTE — Patient Instructions (Signed)
Congratulations on not smoking, it's the most important aspect of your care  No change on your medications = symbicort and spiriva automatically every day  Work on maintaining perfect  inhaler technique:  relax and gently blow all the way out then take a nice smooth deep breath back in, triggering the inhaler at same time you start breathing in.  Hold for up to 5 seconds if you can. Blow out thru nose. Rinse and gargle with water when done     Only use your albuterol as a rescue medication to be used if you can't catch your breath by resting or doing a relaxed purse lip breathing pattern.  - The less you use it, the better it will work when you need it. - Ok to use up to 2 puffs  every 4 hours if you must but call for immediate appointment if use goes up over your usual need - Don't leave home without it !!  (think of it like the spare tire for your car)  Plug 02 into your cpap machine but no need to wear it otherwise   Keep appt to see Dr Craige Cotta with pfts as planned - call us sooner if needed

## 2015-07-30 NOTE — Telephone Encounter (Signed)
In your IN box for completion.  

## 2015-07-31 DIAGNOSIS — J9611 Chronic respiratory failure with hypoxia: Secondary | ICD-10-CM | POA: Insufficient documentation

## 2015-07-31 NOTE — Telephone Encounter (Signed)
New copy in your IN box.

## 2015-07-31 NOTE — Assessment & Plan Note (Addendum)
Spirometry 2/9 2015:    FEV1 1.45 (58%) ratio 71 pre B2 with min response to saba  - 10/06/2013  try dulera 100  2bid > improved - quit smoking 07/17/15  - 07/30/2015  extensive coaching HFA effectiveness =    90%   She continues to act more like ab than copd though the point is moot for now as the rx is the same:  Continue symbicort 160 2bid ad spiriva with pfts off cigarettes when returns to see Dr Craige Cotta and may be able to simpify rx then  I had an extended discussion with the patient reviewing all relevant studies (including extensive completed to date and  lasting 25 minutes of a 40 minute visit      I reviewed the Fletcher curve with the patient that basically indicates  if you quit smoking when your best day FEV1 is still well preserved (as is clearly  the case here)  it is highly unlikely you will progress to severe disease and informed the patient there was no medication on the market that has proven to alter the curve/ its downward trajectory  or the likelihood of progression of their disease.  Therefore stopping smoking and maintaining abstinence is the most important aspect of her care, not choice of inhalers or for that matter, doctors.   Her problem with walking is also obesity related and that will need to be monitored addressed as most pts gain wt after quit smoking     Each maintenance medication was reviewed in detail including most importantly the difference between maintenance and prns and under what circumstances the prns are to be triggered using an action plan format that is not reflected in the computer generated alphabetically organized AVS.    Please see instructions for details which were reviewed in writing and the patient given a copy highlighting the part that I personally wrote and discussed at today's ov.

## 2015-07-31 NOTE — Telephone Encounter (Signed)
Spoke with patient and she will have clearer copy faxed.

## 2015-07-31 NOTE — Assessment & Plan Note (Signed)
Body mass index is 47.13   Lab Results  Component Value Date   TSH 2.93 09/11/2014     Contributing to gerd tendency/ doe/reviewed the need and the process to achieve and maintain neg calorie balance > defer f/u primary care including intermittently monitoring thyroid status

## 2015-07-31 NOTE — Assessment & Plan Note (Signed)
07/30/2015   Walked RA  2 laps @ 185 ft each stopped due to  Sob with sats still 90% so rec 02 hs only plugged into cpap machine  F/u Dr Craige Cotta planned

## 2015-08-05 ENCOUNTER — Encounter: Payer: Self-pay | Admitting: Family Medicine

## 2015-08-05 ENCOUNTER — Ambulatory Visit (INDEPENDENT_AMBULATORY_CARE_PROVIDER_SITE_OTHER): Payer: Managed Care, Other (non HMO) | Admitting: Family Medicine

## 2015-08-05 VITALS — BP 128/86 | HR 80 | Temp 97.5°F | Wt 275.0 lb

## 2015-08-05 DIAGNOSIS — G4733 Obstructive sleep apnea (adult) (pediatric): Secondary | ICD-10-CM

## 2015-08-05 DIAGNOSIS — I10 Essential (primary) hypertension: Secondary | ICD-10-CM

## 2015-08-05 DIAGNOSIS — Z87891 Personal history of nicotine dependence: Secondary | ICD-10-CM

## 2015-08-05 DIAGNOSIS — J4530 Mild persistent asthma, uncomplicated: Secondary | ICD-10-CM | POA: Diagnosis not present

## 2015-08-05 DIAGNOSIS — E118 Type 2 diabetes mellitus with unspecified complications: Secondary | ICD-10-CM

## 2015-08-05 DIAGNOSIS — Z9989 Dependence on other enabling machines and devices: Secondary | ICD-10-CM

## 2015-08-05 DIAGNOSIS — R0603 Acute respiratory distress: Secondary | ICD-10-CM

## 2015-08-05 DIAGNOSIS — Z7689 Persons encountering health services in other specified circumstances: Secondary | ICD-10-CM

## 2015-08-05 DIAGNOSIS — J9601 Acute respiratory failure with hypoxia: Secondary | ICD-10-CM

## 2015-08-05 DIAGNOSIS — R0600 Dyspnea, unspecified: Secondary | ICD-10-CM

## 2015-08-05 MED ORDER — FLUTICASONE PROPIONATE 50 MCG/ACT NA SUSP: 2.0000 | Freq: Every day | NASAL | Status: DC

## 2015-08-05 NOTE — Assessment & Plan Note (Addendum)
Congratulated on quitting smoking - encouraged she recruit husband to quit with her

## 2015-08-05 NOTE — Progress Notes (Signed)
BP 128/86 mmHg  Pulse 80  Temp(Src) 97.5 F (36.4 C) (Oral)  Wt 275 lb (124.739 kg)  SpO2 95%   CC: hosp f/u visit  Subjective:    Patient ID: Brenda [name], female    DOB: 24-Nov-1960, 55 y.o.   MRN: [id number]  HPI: Brenda BarrenJudy L [name] is a 55 y.o. female presenting on 08/05/2015 for Follow-up   Recent hospitalization for acute on chronic hypoxic respiratory failure with COPD exacerbation. Treated with steroids and azithromycin. She did receive pneumovax in hospital. In step down throughout hospitalization.  She saw Dr Sherene SiresWert in f/u on Tuesday - actual diagnosis likely asthmatic bronchitis - but same treatment plan.  Ambulatory pulse ox with Dr Sherene SiresWert - sats remain 90% - rec night time O2 with CPAP -> she is not taking this. Persistent dyspnea with any exertion. Some L>R earache. No fevers.  No recent proair use. Complaint with symbicort 2 puffs bid and spiriva nightly.   Stable blood pressures - antihypertensives were not restarted.  Continued smoker - actually quit 07/17/2015 and has not restarted. Declines assistance.   DM - has relion glucosemeter, needs to buy strips. A1c 7.2% during this hospitalization - metformin was started. GI intolerance - loose stools since starting.  HTN - off BP meds for last month - off valsartan. Continues hctz 25mg  daily for swelling.   Lab Results  Component Value Date   TSH 2.93 09/11/2014    Lab Results  Component Value Date   HGBA1C 7.2* 07/25/2015   Last CPE 08/2014.   Admit date: 07/20/2015 Discharge date: 07/26/2015  Discharge Diagnoses:  Principal Problem:  Acute respiratory failure with hypoxia [facility](HCC) Active Problems:  Morbid obesity (HCC)  Bipolar I disorder, most recent episode (or current) manic (HCC)  HYPERTENSION, BENIGN ESSENTIAL  Asthmatic bronchitis  COPD exacerbation (HCC)  OSA (obstructive sleep apnea)  Asthma  Hyperglycemia  Tobacco abuse  Type 2 diabetes mellitus with complication, without long-term current use  of insulin (HCC)  Discharge Condition: Stable  Diet recommendation: American diabetic Association   Culture 2/18 MRSA by PCR negative 2/18 influenza A/B/H1N1 negative  2/21 Echocardiogram; - LVEF=60%-65%. - Atrial septum: increased thickness of the septum,c/w lipomatous hypertrophy.  Relevant past medical, surgical, family and social history reviewed and updated as indicated. Interim medical history since our last visit reviewed. Allergies and medications reviewed and updated. Current Outpatient Prescriptions on File Prior to Visit  Medication Sig  . Albuterol Sulfate (PROAIR RESPICLICK) 108 (90 Base) MCG/ACT AEPB Inhale 2 puffs into the lungs every 6 (six) hours as needed. Dyspnea/wheeze  . budesonide-formoterol (SYMBICORT) 160-4.5 MCG/ACT inhaler Inhale 2 puffs into the lungs 2 (two) times daily.  . cetirizine (ZYRTEC) 10 MG tablet Take 10 mg by mouth daily as needed for allergies.  Marland Kitchen. guaiFENesin (MUCINEX) 600 MG 12 hr tablet Take 1 tablet (600 mg total) by mouth 2 (two) times daily.  . hydrochlorothiazide (HYDRODIURIL) 25 MG tablet Take 25 mg by mouth daily.  Marland Kitchen. nystatin (MYCOSTATIN) 100000 UNIT/ML suspension Take 5 mLs (500,000 Units total) by mouth 3 (three) times daily.  Marland Kitchen. tiotropium (SPIRIVA) 18 MCG inhalation capsule Place 1 capsule (18 mcg total) into inhaler and inhale daily.   No current facility-administered medications on file prior to visit.    Review of Systems Per HPI unless specifically indicated in ROS section     Objective:    BP 128/86 mmHg  Pulse 80  Temp(Src) 97.5 F (36.4 C) (Oral)  Wt 275 lb (124.739 kg)  SpO2  95%  Wt Readings from Last 3 Encounters:  08/05/15 275 lb (124.739 kg)  07/30/15 266 lb (120.657 kg)  07/20/15 278 lb 3.5 oz (126.2 kg)   Body mass index is 48.73 kg/(m^2).  Physical Exam  Constitutional: She appears well-developed and well-nourished. No distress.  HENT:  Right Ear: Hearing, external ear and ear canal normal.  Left Ear:  Hearing, external ear and ear canal normal.  Mouth/Throat: Oropharynx is clear and moist. No oropharyngeal exudate.  Dull behind bilat TMs with fluid L>R  Cardiovascular: Normal rate, regular rhythm, normal heart sounds and intact distal pulses.   No murmur heard. Pulmonary/Chest: Effort normal. No respiratory distress. She has wheezes (faint end exp). She has no rales.  Musculoskeletal: She exhibits no edema.  Skin: Skin is warm and dry. No rash noted.  Psychiatric: She has a normal mood and affect.  Nursing note and vitals reviewed.      Assessment & Plan:   Problem List Items Addressed This Visit    Type 2 diabetes mellitus with complication, without long-term current use of insulin (HCC)    New diagnosis in hospital. Metformin bid causing diarrhea - will decrease to  nightly. Reassess in 3 mo at CPE      Relevant Medications   metFORMIN (GLUCOPHAGE) 500 MG tablet   Severe obesity (BMI >= 40) (HCC)    Weight discrepancy likely due to scale variability  euvolemic today      Relevant Medications   metFORMIN (GLUCOPHAGE) 500 MG tablet   OSA on CPAP    Continue nightly CPAP - not on night time O2      HYPERTENSION, BENIGN ESSENTIAL    bp actually stable only on hctz  daily - continue      Ex-smoker    Congratulated on quitting smoking - encouraged she recruit husband to quit with her      Asthmatic bronchitis - Primary    Completed abx and steroid course.  Reports compliance with spiriva, symbicort, prn proair. Continue  She received pneumovax at hospital. Appreciate pulm care of patietn.      RESOLVED: Acute respiratory failure with hypoxia (HCC)    This has resolved.      RESOLVED: Acute respiratory distress (HCC)    This has resolved.          Follow up plan: Return in about 3 months (around 11/05/2015), or as needed, for annual exam, prior fasting for blood work.

## 2015-08-05 NOTE — Assessment & Plan Note (Signed)
This has resolved.

## 2015-08-05 NOTE — Progress Notes (Signed)
Pre visit review using our clinic review tool, if applicable. No additional management support is needed unless otherwise documented below in the visit note. 

## 2015-08-05 NOTE — Assessment & Plan Note (Signed)
New diagnosis in hospital. Metformin bid causing diarrhea - will decrease to 500mg  nightly. Reassess in 3 mo at CPE

## 2015-08-05 NOTE — Assessment & Plan Note (Signed)
Weight discrepancy likely due to scale variability  euvolemic today

## 2015-08-05 NOTE — Telephone Encounter (Signed)
Filled and returned to patient today.

## 2015-08-05 NOTE — Patient Instructions (Addendum)
Return in 3 months for physical, fasting labs prior.  Decrease metformin to 500mg  once nightly.  For ears - you have fluid in both ears. Treat with flonase nasal steroid daily sent to pharmacy.  Congratulations on quitting smoking! Stay quit and let us know if you want assistance with this at all.  [email or web address] -> for more resources.

## 2015-08-05 NOTE — Assessment & Plan Note (Addendum)
Completed abx and steroid course.  Reports compliance with spiriva, symbicort, prn proair. Continue  She received pneumovax at hospital. Appreciate pulm care of patietn.

## 2015-08-05 NOTE — Assessment & Plan Note (Signed)
bp actually stable only on hctz 25mg  daily - continue

## 2015-08-05 NOTE — Assessment & Plan Note (Addendum)
Continue nightly CPAP - not on night time O2

## 2015-08-23 ENCOUNTER — Other Ambulatory Visit: Payer: Self-pay | Admitting: Family Medicine

## 2015-08-29 ENCOUNTER — Telehealth: Payer: Self-pay | Admitting: *Deleted

## 2015-08-29 NOTE — Telephone Encounter (Signed)
Spoke to hospitalist, belinda, who states that she recently received a PA for pts symbicort. Pt was d/c from the hospital and has since been seen by PCP. Massie BougieBelinda has completed PA, which was denied, and wanted to contact office to see if additional PA can be completed for coverage, based possible new Dx, or if pt is needing new Rx. pls advise and contact pt with outcome.

## 2015-08-30 NOTE — Telephone Encounter (Signed)
PA resubmitted. Will await outcome

## 2015-09-05 ENCOUNTER — Other Ambulatory Visit: Payer: Self-pay | Admitting: Family Medicine

## 2015-10-16 ENCOUNTER — Other Ambulatory Visit: Payer: Self-pay | Admitting: Family Medicine

## 2015-10-18 ENCOUNTER — Other Ambulatory Visit (INDEPENDENT_AMBULATORY_CARE_PROVIDER_SITE_OTHER): Payer: Managed Care, Other (non HMO)

## 2015-10-18 ENCOUNTER — Other Ambulatory Visit: Payer: Self-pay | Admitting: Family Medicine

## 2015-10-18 DIAGNOSIS — E118 Type 2 diabetes mellitus with unspecified complications: Secondary | ICD-10-CM

## 2015-10-18 DIAGNOSIS — I10 Essential (primary) hypertension: Secondary | ICD-10-CM | POA: Diagnosis not present

## 2015-10-18 DIAGNOSIS — Z1159 Encounter for screening for other viral diseases: Secondary | ICD-10-CM

## 2015-10-18 DIAGNOSIS — E781 Pure hyperglyceridemia: Secondary | ICD-10-CM

## 2015-10-18 LAB — COMPREHENSIVE METABOLIC PANEL
ALT: 19 U/L (ref 0–35)
AST: 15 U/L (ref 0–37)
Albumin: 3.9 g/dL (ref 3.5–5.2)
Alkaline Phosphatase: 97 U/L (ref 39–117)
BUN: 14 mg/dL (ref 6–23)
CO2: 32 mEq/L (ref 19–32)
Calcium: 9.8 mg/dL (ref 8.4–10.5)
Chloride: 101 mEq/L (ref 96–112)
Creatinine, Ser: 0.87 mg/dL (ref 0.40–1.20)
GFR: 71.26 mL/min (ref >60.00)
Glucose, Bld: 118 mg/dL — ABNORMAL HIGH (ref 70–99)
Potassium: 3.7 mEq/L (ref 3.5–5.1)
Sodium: 140 mEq/L (ref 135–145)
Total Bilirubin: 0.3 mg/dL (ref 0.2–1.2)
Total Protein: 7.4 g/dL (ref 6.0–8.3)

## 2015-10-18 LAB — LIPID PANEL
Cholesterol: 173 mg/dL (ref 0–200)
HDL: 36.4 mg/dL — ABNORMAL LOW (ref >39.00)
LDL Cholesterol: 104 mg/dL — ABNORMAL HIGH (ref 0–99)
NonHDL: 136.7
Total CHOL/HDL Ratio: 5
Triglycerides: 163 mg/dL — ABNORMAL HIGH (ref 0.0–149.0)
VLDL: 32.6 mg/dL (ref 0.0–40.0)

## 2015-10-18 LAB — HEMOGLOBIN A1C: Hgb A1c MFr Bld: 6.8 % — ABNORMAL HIGH (ref 4.6–6.5)

## 2015-10-19 LAB — HEPATITIS C ANTIBODY: HCV Ab: NEGATIVE

## 2015-10-25 ENCOUNTER — Ambulatory Visit (INDEPENDENT_AMBULATORY_CARE_PROVIDER_SITE_OTHER): Payer: Managed Care, Other (non HMO) | Admitting: Family Medicine

## 2015-10-25 ENCOUNTER — Encounter: Payer: Self-pay | Admitting: Family Medicine

## 2015-10-25 ENCOUNTER — Other Ambulatory Visit (HOSPITAL_COMMUNITY)
Admission: RE | Admit: 2015-10-25 | Discharge: 2015-10-25 | Disposition: A | Discharge status: Home / Self Care | Payer: Managed Care, Other (non HMO) | Source: Ambulatory Visit | Attending: Family Medicine | Admitting: Family Medicine

## 2015-10-25 VITALS — BP 132/72 | HR 92 | Temp 98.1°F | Ht 61.5 in | Wt 272.5 lb

## 2015-10-25 DIAGNOSIS — J4530 Mild persistent asthma, uncomplicated: Secondary | ICD-10-CM

## 2015-10-25 DIAGNOSIS — Z01419 Encounter for gynecological examination (general) (routine) without abnormal findings: Secondary | ICD-10-CM | POA: Diagnosis not present

## 2015-10-25 DIAGNOSIS — E781 Pure hyperglyceridemia: Secondary | ICD-10-CM | POA: Diagnosis not present

## 2015-10-25 DIAGNOSIS — N760 Acute vaginitis: Secondary | ICD-10-CM | POA: Insufficient documentation

## 2015-10-25 DIAGNOSIS — Z9989 Dependence on other enabling machines and devices: Secondary | ICD-10-CM

## 2015-10-25 DIAGNOSIS — F3110 Bipolar disorder, current episode manic without psychotic features, unspecified: Secondary | ICD-10-CM

## 2015-10-25 DIAGNOSIS — E118 Type 2 diabetes mellitus with unspecified complications: Secondary | ICD-10-CM

## 2015-10-25 DIAGNOSIS — G4733 Obstructive sleep apnea (adult) (pediatric): Secondary | ICD-10-CM

## 2015-10-25 DIAGNOSIS — Z124 Encounter for screening for malignant neoplasm of cervix: Secondary | ICD-10-CM

## 2015-10-25 DIAGNOSIS — Z0000 Encounter for general adult medical examination without abnormal findings: Secondary | ICD-10-CM

## 2015-10-25 DIAGNOSIS — Z1211 Encounter for screening for malignant neoplasm of colon: Secondary | ICD-10-CM

## 2015-10-25 DIAGNOSIS — I10 Essential (primary) hypertension: Secondary | ICD-10-CM

## 2015-10-25 DIAGNOSIS — Z87891 Personal history of nicotine dependence: Secondary | ICD-10-CM

## 2015-10-25 LAB — HM PAP SMEAR: HM Pap smear: NORMAL

## 2015-10-25 MED ORDER — FLUTICASONE FUROATE-VILANTEROL 200-25 MCG/INH IN AEPB: 1.0000 | INHALATION_SPRAY | Freq: Every day | RESPIRATORY_TRACT | Status: DC

## 2015-10-25 NOTE — Assessment & Plan Note (Signed)
Appreciate pulm care of patient. Will trial breo per insurance formulary.

## 2015-10-25 NOTE — Progress Notes (Signed)
Pre visit review using our clinic review tool, if applicable. No additional management support is needed unless otherwise documented below in the visit note. 

## 2015-10-25 NOTE — Assessment & Plan Note (Signed)
Tolerating metformin once daily - A1c improved. RTC 6 mo DM f/u visit.

## 2015-10-25 NOTE — Assessment & Plan Note (Addendum)
Congratulated on continued abstainment from smoking. Encouraged she work on getting husband to quit or at least smoke outside.

## 2015-10-25 NOTE — Assessment & Plan Note (Signed)
Continue CPAP.  

## 2015-10-25 NOTE — Addendum Note (Signed)
Addended by: Josph MachoANCE, Kikue Gerhart A on: 10/25/2015 12:42 PM   Modules accepted: Orders

## 2015-10-25 NOTE — Patient Instructions (Addendum)
Recheck weight. Congratulations on weight loss to date! Trial breo sent to pharmacy in place of symbicort.  Pass by our lab to pick up stool kit. Bring [place] copy of your living will to update your chart. You are doing well today. Keep asking your husband to smoke outside.  Return as needed or in 6 months for follow up visit diabetes  Health Maintenance, Female Adopting a healthy lifestyle and getting preventive care can go a long way to promote health and wellness. Talk with your health care provider about what schedule of regular examinations is right for you. This is a good chance for you to check in with your provider about disease prevention and staying healthy. In between checkups, there are plenty of things you can do on your own. Experts have done a lot of research about which lifestyle changes and preventive measures are most likely to keep you healthy. Ask your health care provider for more information. WEIGHT AND DIET  Eat a healthy diet  Be sure to include plenty of vegetables, fruits, low-fat dairy products, and lean protein.  Do not eat a lot of foods high in solid fats, added sugars, or salt.  Get regular exercise. This is one of the most important things you can do for your health.  Most adults should exercise for at least 150 minutes each week. The exercise should increase your heart rate and make you sweat (moderate-intensity exercise).  Most adults should also do strengthening exercises at least twice a week. This is in addition to the moderate-intensity exercise.  Maintain a healthy weight  Body mass index (BMI) is a measurement that can be used to identify possible weight problems. It estimates body fat based on height and weight. Your health care provider can help determine your BMI and help you achieve or maintain a healthy weight.  For females 44 years of age and older:   A BMI below 18.5 is considered underweight.  A BMI of 18.5 to 24.9 is normal.  A BMI of 25  to 29.9 is considered overweight.  A BMI of 30 and above is considered obese.  Watch levels of cholesterol and blood lipids  You should start having your blood tested for lipids and cholesterol at 57 years of age, then have this test every 5 years.  You may need to have your cholesterol levels checked more often if:  Your lipid or cholesterol levels are high.  You are older than 57 years of age.  You are at high risk for heart disease.  CANCER SCREENING   Lung Cancer  Lung cancer screening is recommended for adults 5-8 years old who are at high risk for lung cancer because of a history of smoking.  A yearly low-dose CT scan of the lungs is recommended for people who:  Currently smoke.  Have quit within the past 15 years.  Have at least a 30-pack-year history of smoking. A pack year is smoking an average of one pack of cigarettes a day for 1 year.  Yearly screening should continue until it has been 15 years since you quit.  Yearly screening should stop if you develop a health problem that would prevent you from having lung cancer treatment.  Breast Cancer  Practice breast self-awareness. This means understanding how your breasts normally appear and feel.  It also means doing regular breast self-exams. Let your health care provider know about any changes, no matter how small.  If you are in your 20s or 30s, you should  have a clinical breast exam (CBE) by a health care provider every 1-3 years as part of a regular health exam.  If you are 37 or older, have a CBE every year. Also consider having a breast X-ray (mammogram) every year.  If you have a family history of breast cancer, talk to your health care provider about genetic screening.  If you are at high risk for breast cancer, talk to your health care provider about having an MRI and a mammogram every year.  Breast cancer gene (BRCA) assessment is recommended for women who have family members with BRCA-related  cancers. BRCA-related cancers include:  Breast.  Ovarian.  Tubal.  Peritoneal cancers.  Results of the assessment will determine the need for genetic counseling and BRCA1 and BRCA2 testing. Cervical Cancer Your health care provider may recommend that you be screened regularly for cancer of the pelvic organs (ovaries, uterus, and vagina). This screening involves a pelvic examination, including checking for microscopic changes to the surface of your cervix (Pap test). You may be encouraged to have this screening done every 3 years, beginning at age 40.  For women ages 25-65, health care providers may recommend pelvic exams and Pap testing every 3 years, or they may recommend the Pap and pelvic exam, combined with testing for human papilloma virus (HPV), every 5 years. Some types of HPV increase your risk of cervical cancer. Testing for HPV may also be done on women of any age with unclear Pap test results.  Other health care providers may not recommend any screening for nonpregnant women who are considered low risk for pelvic cancer and who do not have symptoms. Ask your health care provider if a screening pelvic exam is right for you.  If you have had past treatment for cervical cancer or a condition that could lead to cancer, you need Pap tests and screening for cancer for at least 20 years after your treatment. If Pap tests have been discontinued, your risk factors (such as having a new sexual partner) need to be reassessed to determine if screening should resume. Some women have medical problems that increase the chance of getting cervical cancer. In these cases, your health care provider may recommend more frequent screening and Pap tests. Colorectal Cancer  This type of cancer can be detected and often prevented.  Routine colorectal cancer screening usually begins at 57 years of age and continues through 57 years of age.  Your health care provider may recommend screening at an earlier  age if you have risk factors for colon cancer.  Your health care provider may also recommend using home test kits to check for hidden blood in the stool.  A small camera at the end of a tube can be used to examine your colon directly (sigmoidoscopy or colonoscopy). This is done to check for the earliest forms of colorectal cancer.  Routine screening usually begins at age 35.  Direct examination of the colon should be repeated every 5-10 years through 57 years of age. However, you may need to be screened more often if early forms of precancerous polyps or small growths are found. Skin Cancer  Check your skin from head to toe regularly.  Tell your health care provider about any new moles or changes in moles, especially if there is a change in a mole's shape or color.  Also tell your health care provider if you have a mole that is larger than the size of a pencil eraser.  Always use sunscreen. Apply  sunscreen liberally and repeatedly throughout the day.  Protect yourself by wearing long sleeves, pants, a wide-brimmed hat, and sunglasses whenever you are outside. HEART DISEASE, DIABETES, AND HIGH BLOOD PRESSURE   High blood pressure causes heart disease and increases the risk of stroke. High blood pressure is more likely to develop in:  People who have blood pressure in the high end of the normal range (130-139/85-89 mm Hg).  People who are overweight or obese.  People who are African American.  If you are 67-36 years of age, have your blood pressure checked every 3-5 years. If you are 55 years of age or older, have your blood pressure checked every year. You should have your blood pressure measured twice--once when you are at a hospital or clinic, and once when you are not at a hospital or clinic. Record the average of the two measurements. To check your blood pressure when you are not at a hospital or clinic, you can use:  An automated blood pressure machine at a pharmacy.  A home  blood pressure monitor.  If you are between 10 years and 81 years old, ask your health care provider if you should take aspirin to prevent strokes.  Have regular diabetes screenings. This involves taking a blood sample to check your fasting blood sugar level.  If you are at a normal weight and have a low risk for diabetes, have this test once every three years after 57 years of age.  If you are overweight and have a high risk for diabetes, consider being tested at a younger age or more often. PREVENTING INFECTION  Hepatitis B  If you have a higher risk for hepatitis B, you should be screened for this virus. You are considered at high risk for hepatitis B if:  You were born in a country where hepatitis B is common. Ask your health care provider which countries are considered high risk.  Your parents were born in a high-risk country, and you have not been immunized against hepatitis B (hepatitis B vaccine).  You have HIV or AIDS.  You use needles to inject street drugs.  You live with someone who has hepatitis B.  You have had sex with someone who has hepatitis B.  You get hemodialysis treatment.  You take certain medicines for conditions, including cancer, organ transplantation, and autoimmune conditions. Hepatitis C  Blood testing is recommended for:  Everyone born from [birth year] through 1967.  Anyone with known risk factors for hepatitis C. Sexually transmitted infections (STIs)  You should be screened for sexually transmitted infections (STIs) including gonorrhea and chlamydia if:  You are sexually active and are younger than 57 years of age.  You are older than 57 years of age and your health care provider tells you that you are at risk for this type of infection.  Your sexual activity has changed since you were last screened and you are at an increased risk for chlamydia or gonorrhea. Ask your health care provider if you are at risk.  If you do not have HIV, but are at  risk, it may be recommended that you take a prescription medicine daily to prevent HIV infection. This is called pre-exposure prophylaxis (PrEP). You are considered at risk if:  You are sexually active and do not regularly use condoms or know the HIV status of your partner(s).  You take drugs by injection.  You are sexually active with a partner who has HIV. Talk with your health care provider about whether  you are at high risk of being infected with HIV. If you choose to begin PrEP, you should first be tested for HIV. You should then be tested every 3 months for as long as you are taking PrEP.  PREGNANCY   If you are premenopausal and you may become pregnant, ask your health care provider about preconception counseling.  If you may become pregnant, take 400 to 800 micrograms (mcg) of folic acid every day.  If you want to prevent pregnancy, talk to your health care provider about birth control (contraception). OSTEOPOROSIS AND MENOPAUSE   Osteoporosis is a disease in which the bones lose minerals and strength with aging. This can result in serious bone fractures. Your risk for osteoporosis can be identified using a bone density scan.  If you are 8 years of age or older, or if you are at risk for osteoporosis and fractures, ask your health care provider if you should be screened.  Ask your health care provider whether you should take a calcium or vitamin D supplement to lower your risk for osteoporosis.  Menopause may have certain physical symptoms and risks.  Hormone replacement therapy may reduce some of these symptoms and risks. Talk to your health care provider about whether hormone replacement therapy is right for you.  HOME CARE INSTRUCTIONS   Schedule regular health, dental, and eye exams.  Stay current with your immunizations.   Do not use any tobacco products including cigarettes, chewing tobacco, or electronic cigarettes.  If you are pregnant, do not drink alcohol.  If  you are breastfeeding, limit how much and how often you drink alcohol.  Limit alcohol intake to no more than 1 drink per day for nonpregnant women. One drink equals 12 ounces of beer, 5 ounces of wine, or 1 ounces of hard liquor.  Do not use street drugs.  Do not share needles.  Ask your health care provider for help if you need support or information about quitting drugs.  Tell your health care provider if you often feel depressed.  Tell your health care provider if you have ever been abused or do not feel safe at home.   This information is not intended to replace advice given to you by your health care provider. Make sure you discuss any questions you have with your health care provider.   Document Released: 12/01/2010 Document Revised: 06/08/2014 Document Reviewed: 04/19/2013 Elsevier Interactive Patient Education [employer].

## 2015-10-25 NOTE — Assessment & Plan Note (Signed)
Preventative protocols reviewed and updated unless pt declined. Discussed healthy diet and lifestyle.  

## 2015-10-25 NOTE — Assessment & Plan Note (Signed)
Continue to encourage healthy diet and lifestyle changes to affect sustainable weight loss.  

## 2015-10-25 NOTE — Assessment & Plan Note (Signed)
Chronic, stable. Continue hctz 25mg daily.  

## 2015-10-25 NOTE — Addendum Note (Signed)
Addended by: Eustaquio BoydenGUTIERREZ, Niema Carrara on: 10/25/2015 02:24 PM   Modules accepted: Kipp BroodSmartSet

## 2015-10-25 NOTE — Progress Notes (Addendum)
BP 132/72 mmHg  Pulse 92  Temp(Src) 98.1 F (36.7 C) (Oral)  Ht 5' 1.5" (1.562 m)  Wt 272 lb 8 oz (123.605 kg)  BMI 50.66 kg/m2   CC: CPE  Subjective:    Patient ID: Brenda [name], female    DOB: June 05, 1960, 55 y.o.   MRN: [id number]  HPI: Brenda [name] is a 55 y.o. female presenting on 10/25/2015 for Annual Exam and Medication Management   Recent hospitalization earlier this year with COPD exacerbation with acute on chronic hypoxic respiratory failure. Received pneumovax in hospital. Saw Dr Sherene Sires in f/u - dx with asthmatic bronchitis. rec night time O2 with CPAP - has not been taking.   She has been taking symbicort 2 puffs bid + spiriva nightly. Insurance requiring formulary change to breo. Has f/u with Dr Craige Cotta next month.   Started on metformin  during recent hospitalization. BID dosing caused nausea so down to once daily dosing. Brings sugar log BID before meals - 110-140. She has changed diet - backed of biscuits and sweet tea at [employer].   Quit smoking 07/2015. Husband continues to smoke - significant second hand smoke exposure.   Preventative: Colon cancer screening - discussed, normal iFOB 2016. Requests repeat Mammogram - 08/2014 WNL. Due for rpt Well woman exam - normal 08/2014, no prior recent. Will check yearly x 3 yrs then space out. H/o BTL.  Flu yearly Pneumovax 2003, 07/2015 Tetanus - deferred for now Advanced directive - HCPOA is husband. Has advanced directive at home. Will bring me copy Seat belt use discussed Sunscreen use discussed, no changing moles on skin.  Lives with husband and son and daughter in law and grandchild Occupation: Media planner at [employer] Activity: limited by dyspnea Diet: good water, fruits/vegetables daily  Relevant past medical, surgical, family and social history reviewed and updated as indicated. Interim medical history since our last visit reviewed. Allergies and medications reviewed and updated. Current  Outpatient Prescriptions on File Prior to Visit  Medication Sig  . albuterol (PROVENTIL) (2.5 MG/3ML) 0.083% nebulizer solution INHALE 1 VIAL VIA NEBULIZER EVERY 6 HOURS AS NEEDED FOR WHEEZING/SHORT BREATH  . Albuterol Sulfate (PROAIR RESPICLICK) 108 (90 Base) MCG/ACT AEPB Inhale 2 puffs into the lungs every 6 (six) hours as needed. Dyspnea/wheeze  . cetirizine (ZYRTEC) 10 MG tablet Take 10 mg by mouth daily as needed for allergies.  . fluticasone (FLONASE) 50 MCG/ACT nasal spray Place 2 sprays into both nostrils daily.  Marland Kitchen guaiFENesin (MUCINEX) 600 MG 12 hr tablet Take 1 tablet (600 mg total) by mouth 2 (two) times daily.  . hydrochlorothiazide (HYDRODIURIL) 25 MG tablet Take 25 mg by mouth daily.  . metFORMIN (GLUCOPHAGE) 500 MG tablet Take 1 tablet (500 mg total) by mouth at bedtime.  Marland Kitchen nystatin (MYCOSTATIN) 100000 UNIT/ML suspension TAKE 1 TEASPOONFUL BY MOUTH 3 TIMES A DAY FOR 10 DAYS  . tiotropium (SPIRIVA) 18 MCG inhalation capsule Place 1 capsule (18 mcg total) into inhaler and inhale daily.   No current facility-administered medications on file prior to visit.    Review of Systems  Constitutional: Negative for fever, chills, activity change, appetite change, fatigue and unexpected weight change.  HENT: Negative for hearing loss.   Eyes: Negative for visual disturbance.  Respiratory: Positive for cough, shortness of breath and wheezing. Negative for chest tightness.   Cardiovascular: Negative for chest pain, palpitations and leg swelling.  Gastrointestinal: Negative for nausea, vomiting, abdominal pain, diarrhea, constipation, blood in stool and abdominal distention.  Genitourinary: Negative for hematuria and difficulty urinating.  Musculoskeletal: Positive for back pain. Negative for myalgias, arthralgias and neck pain.  Skin: Negative for rash.  Neurological: Negative for dizziness, seizures, syncope and headaches.  Hematological: Negative for adenopathy. Does not bruise/bleed  easily.  Psychiatric/Behavioral: Negative for dysphoric mood. The patient is not nervous/anxious.    Per HPI unless specifically indicated in ROS section     Objective:    BP 132/72 mmHg  Pulse 92  Temp(Src) 98.1 F (36.7 C) (Oral)  Ht 5' 1.5" (1.562 m)  Wt 272 lb 8 oz (123.605 kg)  BMI 50.66 kg/m2  Wt Readings from Last 3 Encounters:  10/25/15 272 lb 8 oz (123.605 kg)  08/05/15 275 lb (124.739 kg)  07/30/15 266 lb (120.657 kg)    Physical Exam  Constitutional: She is oriented to person, place, and time. She appears well-developed and well-nourished. No distress.  HENT:  Head: Normocephalic and atraumatic.  Right Ear: Hearing, tympanic membrane, external ear and ear canal normal.  Left Ear: Hearing, tympanic membrane, external ear and ear canal normal.  Nose: Nose normal.  Mouth/Throat: Uvula is midline, oropharynx is clear and moist and mucous membranes are normal. No oropharyngeal exudate, posterior oropharyngeal edema or posterior oropharyngeal erythema.  Eyes: Conjunctivae and EOM are normal. Pupils are equal, round, and reactive to light. No scleral icterus.  Neck: Normal range of motion. Neck supple. No thyromegaly present.  Cardiovascular: Normal rate, regular rhythm, normal heart sounds and intact distal pulses.   No murmur heard. Pulses:      Radial pulses are 2+ on the right side, and 2+ on the left side.  Pulmonary/Chest: Effort normal and breath sounds normal. No respiratory distress. She has no wheezes. She has no rales. Right breast exhibits no inverted nipple, no mass, no nipple discharge and no skin change. Left breast exhibits no inverted nipple, no mass, no nipple discharge and no skin change. Breasts are symmetrical.  Abdominal: Soft. Bowel sounds are normal. She exhibits no distension and no mass. There is no tenderness. There is no rebound and no guarding.  Genitourinary: Vagina normal and uterus normal. Pelvic exam was performed with patient supine. There is  no rash, tenderness or lesion on the right labia. There is no rash, tenderness or lesion on the left labia. Cervix exhibits no motion tenderness and no friability. Right adnexum displays no mass and no tenderness. Left adnexum displays no mass and no tenderness.  Musculoskeletal: Normal range of motion. She exhibits no edema.  Lymphadenopathy:       Head (right side): No submental, no submandibular, no tonsillar, no preauricular and no posterior auricular adenopathy present.       Head (left side): No submental, no submandibular, no tonsillar, no preauricular and no posterior auricular adenopathy present.    She has no cervical adenopathy.    She has no axillary adenopathy.       Right axillary: No lateral adenopathy present.       Left axillary: No lateral adenopathy present.      Right: No supraclavicular adenopathy present.       Left: No supraclavicular adenopathy present.  Neurological: She is alert and oriented to person, place, and time.  CN grossly intact, station and gait intact  Skin: Skin is warm and dry. No rash noted.  Psychiatric: She has a normal mood and affect. Her behavior is normal. Judgment and thought content normal.  Nursing note and vitals reviewed.  Results for orders placed or performed in  visit on 10/18/15  Lipid panel  Result Value Ref Range   Cholesterol 173 0 - 200 mg/dL   Triglycerides 454.0163.0 (H) 0.0 - 149.0 mg/dL   HDL 98.1136.40 (L) >91.47>39.00 mg/dL   VLDL 82.932.6 0.0 - 56.240.0 mg/dL   LDL Cholesterol 130104 (H) 0 - 99 mg/dL   Total CHOL/HDL Ratio 5    NonHDL 136.70   Comprehensive metabolic panel  Result Value Ref Range   Sodium 140 135 - 145 mEq/L   Potassium 3.7 3.5 - 5.1 mEq/L   Chloride 101 96 - 112 mEq/L   CO2 32 19 - 32 mEq/L   Glucose, Bld 118 (H) 70 - 99 mg/dL   BUN 14 6 - 23 mg/dL   Creatinine, Ser 8.650.87 0.40 - 1.20 mg/dL   Total Bilirubin 0.3 0.2 - 1.2 mg/dL   Alkaline Phosphatase 97 39 - 117 U/L   AST 15 0 - 37 U/L   ALT 19 0 - 35 U/L   Total Protein  7.4 6.0 - 8.3 g/dL   Albumin 3.9 3.5 - 5.2 g/dL   Calcium 9.8 8.4 - 78.410.5 mg/dL   GFR 69.6271.26 >95.28>60.00 mL/min  Hemoglobin A1c  Result Value Ref Range   Hgb A1c MFr Bld 6.8 (H) 4.6 - 6.5 %      Assessment & Plan:   Problem List Items Addressed This Visit    HYPERTRIGLYCERIDEMIA    Continue to monitor - anticipate improvement with better glycemic control      Severe obesity (BMI >= 40) (HCC)    Continue to encourage healthy diet and lifestyle changes to affect sustainable weight loss.      Bipolar I disorder, most recent episode (or current) manic (HCC)    Stable - off mood stabilizers.      Ex-smoker    Congratulated on continued abstainment from smoking. Encouraged she work on getting husband to quit or at least smoke outside.      HYPERTENSION, BENIGN ESSENTIAL    Chronic, stable. Continue hctz 25mg  daily.      Asthmatic bronchitis    Appreciate pulm care of patient. Will trial breo per insurance formulary.       Relevant Medications   fluticasone furoate-vilanterol (BREO ELLIPTA) 200-25 MCG/INH AEPB   OSA on CPAP    Continue CPAP.      Health maintenance examination - Primary    Preventative protocols reviewed and updated unless pt declined. Discussed healthy diet and lifestyle.       Type 2 diabetes mellitus with complication, without long-term current use of insulin (HCC)    Tolerating metformin once daily - A1c improved. RTC 6 mo DM f/u visit.       Other Visit Diagnoses    Special screening for malignant neoplasms, colon        Relevant Orders    Fecal occult blood, imunochemical    Pap smear for cervical cancer screening        Relevant Orders    Cytology - PAP        Follow up plan: Return in about 6 months (around 04/26/2016), or as needed, for follow up visit.  Eustaquio BoydenJavier Miroslava Santellan, MD

## 2015-10-25 NOTE — Assessment & Plan Note (Signed)
Stable - off mood stabilizers.

## 2015-10-25 NOTE — Assessment & Plan Note (Signed)
Continue to monitor - anticipate improvement with better glycemic control

## 2015-10-29 LAB — CYTOLOGY - PAP

## 2015-10-30 ENCOUNTER — Encounter: Payer: Self-pay | Admitting: *Deleted

## 2015-10-31 LAB — CERVICOVAGINAL ANCILLARY ONLY: Candida vaginitis: NEGATIVE

## 2015-11-04 ENCOUNTER — Ambulatory Visit (INDEPENDENT_AMBULATORY_CARE_PROVIDER_SITE_OTHER): Payer: Managed Care, Other (non HMO) | Admitting: Pulmonary Disease

## 2015-11-04 ENCOUNTER — Encounter: Payer: Self-pay | Admitting: Pulmonary Disease

## 2015-11-04 VITALS — BP 142/82 | HR 77 | Ht 61.0 in | Wt 274.0 lb

## 2015-11-04 DIAGNOSIS — Z9989 Dependence on other enabling machines and devices: Secondary | ICD-10-CM

## 2015-11-04 DIAGNOSIS — J45909 Unspecified asthma, uncomplicated: Secondary | ICD-10-CM

## 2015-11-04 DIAGNOSIS — J441 Chronic obstructive pulmonary disease with (acute) exacerbation: Secondary | ICD-10-CM

## 2015-11-04 DIAGNOSIS — G4733 Obstructive sleep apnea (adult) (pediatric): Secondary | ICD-10-CM

## 2015-11-04 DIAGNOSIS — J449 Chronic obstructive pulmonary disease, unspecified: Secondary | ICD-10-CM

## 2015-11-04 LAB — PULMONARY FUNCTION TEST
DL/VA % pred: 101 %
DL/VA: 4.46 ml/min/mmHg/L
DLCO cor % pred: 92 %
DLCO cor: 18.61 ml/min/mmHg
DLCO unc % pred: 93 %
DLCO unc: 19 ml/min/mmHg
FEF 25-75 Post: 0.94 L/sec
FEF 25-75 Pre: 0.45 L/sec
FEF2575-%Change-Post: 110 %
FEF2575-%Pred-Post: 41 %
FEF2575-%Pred-Pre: 19 %
FEV1-%Change-Post: 24 %
FEV1-%Pred-Post: 57 %
FEV1-%Pred-Pre: 46 %
FEV1-Post: 1.36 L
FEV1-Pre: 1.09 L
FEV1FVC-%Change-Post: 9 %
FEV1FVC-%Pred-Pre: 72 %
FEV6-%Change-Post: 16 %
FEV6-%Pred-Post: 73 %
FEV6-%Pred-Pre: 63 %
FEV6-Post: 2.15 L
FEV6-Pre: 1.84 L
FEV6FVC-%Change-Post: 2 %
FEV6FVC-%Pred-Post: 102 %
FEV6FVC-%Pred-Pre: 99 %
FVC-%Change-Post: 13 %
FVC-%Pred-Post: 71 %
FVC-%Pred-Pre: 63 %
FVC-Post: 2.17 L
FVC-Pre: 1.91 L
Post FEV1/FVC ratio: 63 %
Post FEV6/FVC ratio: 99 %
Pre FEV1/FVC ratio: 57 %
Pre FEV6/FVC Ratio: 97 %
RV % pred: 82 %
RV: 1.47 L
TLC % pred: 89 %
TLC: 4.12 L

## 2015-11-04 NOTE — Progress Notes (Signed)
Current Outpatient Prescriptions on File Prior to Visit  Medication Sig  . albuterol (PROVENTIL) (2.5 MG/3ML) 0.083% nebulizer solution INHALE 1 VIAL VIA NEBULIZER EVERY 6 HOURS AS NEEDED FOR WHEEZING/SHORT BREATH  . Albuterol Sulfate (PROAIR RESPICLICK) 108 (90 Base) MCG/ACT AEPB Inhale 2 puffs into the lungs every 6 (six) hours as needed. Dyspnea/wheeze  . cetirizine (ZYRTEC) 10 MG tablet Take 10 mg by mouth daily as needed for allergies.  . fluticasone (FLONASE) 50 MCG/ACT nasal spray Place 2 sprays into both nostrils daily.  . fluticasone furoate-vilanterol (BREO ELLIPTA) 200-25 MCG/INH AEPB Inhale 1 puff into the lungs daily.  Marland Kitchen. guaiFENesin (MUCINEX) 600 MG 12 hr tablet Take 1 tablet (600 mg total) by mouth 2 (two) times daily.  . hydrochlorothiazide (HYDRODIURIL) 25 MG tablet Take 25 mg by mouth daily.  . metFORMIN (GLUCOPHAGE) 500 MG tablet Take 1 tablet (500 mg total) by mouth at bedtime.  Marland Kitchen. nystatin (MYCOSTATIN) 100000 UNIT/ML suspension TAKE 1 TEASPOONFUL BY MOUTH 3 TIMES A DAY FOR 10 DAYS  . tiotropium (SPIRIVA) 18 MCG inhalation capsule Place 1 capsule (18 mcg total) into inhaler and inhale daily.   No current facility-administered medications on file prior to visit.    Chief Complaint  Patient presents with  . Follow-up    Pt denies any current breathing issues. Review PFT. Wears CPAP machine nightly. Denies any issues with pressure setting. Pt reports some leaking from mask over the past 2 weeks or so. [facility]     Tests PFT 07/29/06 >> FEV1 1.75 (73%), FEV1% 60, TLC 4.81 (105%), DLCO 55%, + BD HST 12/08/14 >> AHI 58.6, SaO2 low 64%. A1AT 03/25/15 >> 176, MM Auto CPAP 10/02/15 to 10/31/15 >> used on 30 of 30 nights with average 7 hrs 19 min.  Average AHI 1 with median CPAP 9 and 95 th percentile CPAP 13 cm H2O PFT 11/04/14 >> FEV1 1.36 (57%), FEV1% 63, TLC 4.12 (89%), DLCO 93%, +BD  Past medical history HTN, Migraines, Bipolar, HLD, Pancreatitis, Back pain  Past surgical  history, Allergies, Family history, Social history all reviewed.  Vital signs BP 142/82 mmHg  Pulse 77  Ht 5\' 1"  (1.549 m)  Wt 274 lb (124.286 kg)  BMI 51.80 kg/m2  SpO2 94%  History of Present Illness: [name] is a 55 y.o. female former smoker with OSA and COPD.  She was in ICU since I last saw for bronchitis.  She stopped smoking after this.  Her breathing is better except when she is at home >> her husband continues to smoke.  She has to use albuterol 1 or 2 times while at home, but not otherwise.  Her PFT today showed moderate obstruction with positive bronchodilator response.  CPAP download showed good control and compliance.  No issues with mask fit.  Physical Exam:   General - No distress ENT - No sinus tenderness, no oral exudate, no LAN Cardiac - s1s2 regular, no murmur Chest - b/l expiratory wheezing Back - No focal tenderness Abd - Soft, non-tender Ext - No edema Neuro - Normal strength Skin - No rashes Psych - normal mood, and behavior   Assessment/Plan:  COPD with asthma. - continue spiriva, breo and prn albuterol - hopefully her husband will decide to stop smoking  Obstructive sleep apnea. - will continue auto CPAP  Morbid obesity. Plan: - discussed importance of weight loss   Patient Instructions  Follow up in 6 months     Coralyn HellingVineet Sood, MD [facility] Pager:  [phone] 11/04/2015, 5:04 PM

## 2015-11-04 NOTE — Patient Instructions (Addendum)
Follow up in 6 months 

## 2015-11-04 NOTE — Progress Notes (Signed)
PFT done today. 

## 2015-11-05 ENCOUNTER — Other Ambulatory Visit: Payer: Managed Care, Other (non HMO)

## 2015-11-05 DIAGNOSIS — Z1211 Encounter for screening for malignant neoplasm of colon: Secondary | ICD-10-CM

## 2015-11-05 LAB — FECAL OCCULT BLOOD, GUAIAC: Fecal Occult Blood: NEGATIVE

## 2015-11-05 LAB — FECAL OCCULT BLOOD, IMMUNOCHEMICAL: Fecal Occult Bld: NEGATIVE

## 2015-11-06 ENCOUNTER — Encounter: Payer: Self-pay | Admitting: *Deleted

## 2015-12-05 ENCOUNTER — Other Ambulatory Visit: Payer: Self-pay | Admitting: Family Medicine

## 2016-01-02 ENCOUNTER — Other Ambulatory Visit: Payer: Self-pay | Admitting: Family Medicine

## 2016-01-12 ENCOUNTER — Other Ambulatory Visit: Payer: Self-pay | Admitting: Family Medicine

## 2016-01-23 ENCOUNTER — Telehealth: Payer: Self-pay

## 2016-01-23 MED ORDER — LORAZEPAM 0.5 MG PO TABS: 0.2500 mg | ORAL_TABLET | Freq: Two times a day (BID) | ORAL | 0 refills | Status: DC | PRN

## 2016-01-23 NOTE — Telephone Encounter (Signed)
Rx called in as directed and patient notified.  

## 2016-01-23 NOTE — Telephone Encounter (Signed)
I'm sorry to hear this.  May try 1/2 tab at a time of ativan - plz phone in temporary course to pharmacy.  If needing more, come in to review options.

## 2016-01-23 NOTE — Telephone Encounter (Signed)
Pt left v/m; pts husband has been diagnosed with stage 4 lung cancer and pt request med for nerves and to help pt rest at night. Last annual 10/25/15. Pt request cb. [facility]

## 2016-02-18 ENCOUNTER — Other Ambulatory Visit: Payer: Self-pay | Admitting: Family Medicine

## 2016-02-18 ENCOUNTER — Other Ambulatory Visit: Payer: Self-pay | Admitting: Primary Care

## 2016-02-18 DIAGNOSIS — R062 Wheezing: Secondary | ICD-10-CM

## 2016-02-18 DIAGNOSIS — R059 Cough, unspecified: Secondary | ICD-10-CM

## 2016-02-18 DIAGNOSIS — R05 Cough: Secondary | ICD-10-CM

## 2016-02-18 NOTE — Telephone Encounter (Signed)
Left refill on voice mail at pharmacy  

## 2016-02-18 NOTE — Telephone Encounter (Signed)
Ok to refill? Last filled 01/23/16 #[id number]

## 2016-02-18 NOTE — Telephone Encounter (Signed)
Husband recently dx stage 4 lung cancer plz phone in.

## 2016-03-23 ENCOUNTER — Other Ambulatory Visit: Payer: Self-pay | Admitting: Primary Care

## 2016-03-23 DIAGNOSIS — R05 Cough: Secondary | ICD-10-CM

## 2016-03-23 DIAGNOSIS — R062 Wheezing: Secondary | ICD-10-CM

## 2016-03-23 DIAGNOSIS — R059 Cough, unspecified: Secondary | ICD-10-CM

## 2016-03-30 ENCOUNTER — Other Ambulatory Visit: Payer: Self-pay | Admitting: Primary Care

## 2016-03-30 DIAGNOSIS — R05 Cough: Secondary | ICD-10-CM

## 2016-03-30 DIAGNOSIS — R059 Cough, unspecified: Secondary | ICD-10-CM

## 2016-03-30 DIAGNOSIS — R062 Wheezing: Secondary | ICD-10-CM

## 2016-03-31 ENCOUNTER — Other Ambulatory Visit: Payer: Self-pay | Admitting: Family Medicine

## 2016-04-01 NOTE — Telephone Encounter (Signed)
plz phone in. 

## 2016-04-01 NOTE — Telephone Encounter (Signed)
Rx called in as directed.   

## 2016-04-22 ENCOUNTER — Other Ambulatory Visit: Payer: Self-pay | Admitting: Family Medicine

## 2016-04-27 ENCOUNTER — Other Ambulatory Visit: Payer: Self-pay | Admitting: Family Medicine

## 2016-05-01 ENCOUNTER — Other Ambulatory Visit: Payer: Self-pay | Admitting: Family Medicine

## 2016-05-05 ENCOUNTER — Other Ambulatory Visit: Payer: Self-pay | Admitting: Family Medicine

## 2016-05-05 NOTE — Telephone Encounter (Signed)
plz phone in. 

## 2016-05-05 NOTE — Telephone Encounter (Signed)
Rx called in as directed.   

## 2016-05-23 ENCOUNTER — Other Ambulatory Visit: Payer: Self-pay | Admitting: Family Medicine

## 2016-05-24 ENCOUNTER — Other Ambulatory Visit: Payer: Self-pay | Admitting: Family Medicine

## 2016-05-28 ENCOUNTER — Encounter: Payer: Self-pay | Admitting: Family Medicine

## 2016-05-28 ENCOUNTER — Ambulatory Visit (INDEPENDENT_AMBULATORY_CARE_PROVIDER_SITE_OTHER): Payer: Managed Care, Other (non HMO) | Admitting: Family Medicine

## 2016-05-28 VITALS — BP 134/82 | HR 70 | Temp 98.1°F | Wt 258.8 lb

## 2016-05-28 DIAGNOSIS — R05 Cough: Secondary | ICD-10-CM

## 2016-05-28 DIAGNOSIS — J441 Chronic obstructive pulmonary disease with (acute) exacerbation: Secondary | ICD-10-CM

## 2016-05-28 DIAGNOSIS — R059 Cough, unspecified: Secondary | ICD-10-CM

## 2016-05-28 DIAGNOSIS — R062 Wheezing: Secondary | ICD-10-CM | POA: Diagnosis not present

## 2016-05-28 MED ORDER — AMOXICILLIN-POT CLAVULANATE 875-125 MG PO TABS: 1.0000 | ORAL_TABLET | Freq: Two times a day (BID) | ORAL | 0 refills | Status: DC

## 2016-05-28 MED ORDER — HYDROCODONE-HOMATROPINE 5-1.5 MG/5ML PO SYRP: 5.0000 mL | ORAL_SOLUTION | Freq: Three times a day (TID) | ORAL | 0 refills | Status: DC | PRN

## 2016-05-28 MED ORDER — PREDNISONE 20 MG PO TABS: ORAL_TABLET | ORAL | 0 refills | Status: DC

## 2016-05-28 NOTE — Patient Instructions (Signed)
Upper Respiratory Infection, Adult Most upper respiratory infections (URIs) are a viral infection of the air passages leading to the lungs. A URI affects the nose, throat, and upper air passages. The most common type of URI is nasopharyngitis and is typically referred to as "the common cold." URIs run their course and usually go away on their own. Most of the time, a URI does not require medical attention, but sometimes a bacterial infection in the upper airways can follow a viral infection. This is called a secondary infection. Sinus and middle ear infections are common types of secondary upper respiratory infections. Bacterial pneumonia can also complicate a URI. A URI can worsen asthma and chronic obstructive pulmonary disease (COPD). Sometimes, these complications can require emergency medical care and may be life threatening. What are the causes? Almost all URIs are caused by viruses. A virus is a type of germ and can spread from one person to another. What increases the risk? You may be at risk for a URI if:  You smoke.  You have chronic heart or lung disease.  You have a weakened defense (immune) system.  You are very young or very old.  You have nasal allergies or asthma.  You work in crowded or poorly ventilated areas.  You work in health care facilities or schools.  What are the signs or symptoms? Symptoms typically develop 2-3 days after you come in contact with a cold virus. Most viral URIs last 7-10 days. However, viral URIs from the influenza virus (flu virus) can last 14-18 days and are typically more severe. Symptoms may include:  Runny or stuffy (congested) nose.  Sneezing.  Cough.  Sore throat.  Headache.  Fatigue.  Fever.  Loss of appetite.  Pain in your forehead, behind your eyes, and over your cheekbones (sinus pain).  Muscle aches.  How is this diagnosed? Your health care provider may diagnose a URI by:  Physical exam.  Tests to check that your  symptoms are not due to another condition such as: ? Strep throat. ? Sinusitis. ? Pneumonia. ? Asthma.  How is this treated? A URI goes away on its own with time. It cannot be cured with medicines, but medicines may be prescribed or recommended to relieve symptoms. Medicines may help:  Reduce your fever.  Reduce your cough.  Relieve nasal congestion.  Follow these instructions at home:  Take medicines only as directed by your health care provider.  Gargle warm saltwater or take cough drops to comfort your throat as directed by your health care provider.  Use a warm mist humidifier or inhale steam from a shower to increase air moisture. This may make it easier to breathe.  Drink enough fluid to keep your urine clear or pale yellow.  Eat soups and other clear broths and maintain good nutrition.  Rest as needed.  Return to work when your temperature has returned to normal or as your health care provider advises. You may need to stay home longer to avoid infecting others. You can also use a face mask and careful hand washing to prevent spread of the virus.  Increase the usage of your inhaler if you have asthma.  Do not use any tobacco products, including cigarettes, chewing tobacco, or electronic cigarettes. If you need help quitting, ask your health care provider. How is this prevented? The best way to protect yourself from getting a cold is to practice good hygiene.  Avoid oral or hand contact with people with cold symptoms.  Wash your   hands often if contact occurs.  There is no clear evidence that vitamin C, vitamin E, echinacea, or exercise reduces the chance of developing a cold. However, it is always recommended to get plenty of rest, exercise, and practice good nutrition. Contact a health care provider if:  You are getting worse rather than better.  Your symptoms are not controlled by medicine.  You have chills.  You have worsening shortness of breath.  You have  brown or red mucus.  You have yellow or brown nasal discharge.  You have pain in your face, especially when you bend forward.  You have a fever.  You have swollen neck glands.  You have pain while swallowing.  You have white areas in the back of your throat. Get help right away if:  You have severe or persistent: ? Headache. ? Ear pain. ? Sinus pain. ? Chest pain.  You have chronic lung disease and any of the following: ? Wheezing. ? Prolonged cough. ? Coughing up blood. ? A change in your usual mucus.  You have a stiff neck.  You have changes in your: ? Vision. ? Hearing. ? Thinking. ? Mood. This information is not intended to replace advice given to you by your health care provider. Make sure you discuss any questions you have with your health care provider. Document Released: 11/11/2000 Document Revised: 01/19/2016 Document Reviewed: 08/23/2013 Elsevier Interactive Patient Education  2017 [employer]  

## 2016-05-28 NOTE — Progress Notes (Signed)
Subjective:    Patient ID: Brenda [name], female    DOB: 02/08/1961, 55 y.o.   MRN: [id number]  HPI This is a 55 yo female who presents today with cough and congestion, sputum thick and yellow green for over a week. Has been doing albuterol 2x/ day, mucinex and flonase. Subjective fever. Nasal congestion and drainage started today. Sore throat from cough and drainage, no ear pain. Intermittent SOB with exertion, no wheezing. Has used albuterol inhaler this morning. Has history of asthma and COPD. She was recently with husband who was in hospital for 2 days with "mild heart attack." patient quit smoking 1/17 but has been having a few cigarettes lately. Husband smokes in home. 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   Past Medical History:  Diagnosis Date  . Acute pancreatitis   . Asthma    main dyspnea thought due to deconditioning (10/2013)  . Bipolar I disorder, most recent episode (or current) manic, unspecified    pt denies this  . Bronchitis, chronic obstructive (HCC) 2008 & 2009   FeV1 64% TLC 105% DLCO 55% 2008  -  FeV1 81% FeF 25-75 43% 2009  . COPD (chronic obstructive pulmonary disease) (HCC)    emphysema, not an issue per pulm (10/2013)  . History of pyelonephritis 05/2012  . HTN (hypertension)   . Hx of migraines   . Knee pain, right   . Leukocytosis, unspecified   . Lumbar disc disease with radiculopathy    multilevel spondylitic changes, scoliosis, and anterolisthesis L4 not thought to currently be surg candidate (Dr. Phoebe PerchHirsch, [facility]) (Dr. Ollen BowlHarkins, [facility])  . Nicotine addiction   . Nonspecific abnormal electrocardiogram (ECG) (EKG)   . Obesity   . Pure hyperglyceridemia   . Suicide attempt 06/2001  . Swelling of limb   . Urge and stress incontinence 07/2012   ([facility])   Past Surgical History:  Procedure Laterality Date  . CHOLECYSTECTOMY  07/1982  . CT MAXILLOFACIAL WO/W CM  06/14/06   Left max mucocele  . CT of chest  06/14/2006   Normal except c/w  active inflammation / infection.  Left renal atrophy  . ERCP / sphincterotomy - stenosis  01/15/06  . lumbar MRI  11/2010   multilevel spondylitic changes with upper lumbar scoliosis and anterolisthesis of L4 on 5 with some L foraminal narrowing esp at L/3 with concavity of scoliosis, some central stenosis and biforaminal narrowing at L4/5 with spondylolisthesis  . [place] - Pneumonia, acute asthma, left maxillary sinusitis  01/11 - 06/18/2006  . Retained stone     ERCP secondary to retained stone  . Right knee surgery  1984,1989,1989,1991,1994   Reconstructions for ACL insuff  . TUBAL LIGATION     Family History  Problem Relation Age of Onset  . Hypertension Mother   . Hyperlipidemia Mother   . Hypertension Father   . Heart disease Brother     MI in early 7540's  . Asthma Father   . Breast cancer Paternal Grandmother    Social History  Substance Use Topics  . Smoking status: Former Smoker    Packs/day: 0.50    Years: 30.00    Types: Cigarettes    Quit date: 07/17/2015  . Smokeless tobacco: Never Used  . Alcohol use No      Review of Systems Per HPI    Objective:   Physical Exam  Constitutional: She is oriented to person, place, and time. She appears well-developed and well-nourished. No  distress.  Morbidly obese.   HENT:  Head: Normocephalic and atraumatic.  Right Ear: External ear normal.  Left Ear: External ear normal.  Mouth/Throat: Oropharynx is clear and moist.  Eyes: Conjunctivae are normal.  Neck: Normal range of motion. Neck supple.  Cardiovascular: Normal rate, regular rhythm and normal heart sounds.   Pulmonary/Chest: Effort normal. No respiratory distress. She has wheezes (scattered, expiratory. ).  Able to converse in complete sentences.    Musculoskeletal: Normal range of motion.  Lymphadenopathy:    She has no cervical adenopathy.  Neurological: She is alert and oriented to person, place, and time.  Skin: Skin is warm and dry. She is not  diaphoretic.  Psychiatric: She has a normal mood and affect. Her behavior is normal. Judgment and thought content normal.  Vitals reviewed.     BP 134/82   Pulse 70   Temp 98.1 F (36.7 C) (Oral)   Wt 258 lb 12 oz (117.4 kg)   LMP 03/08/2013   SpO2 97%   BMI 48.89 kg/m  Wt Readings from Last 3 Encounters:  05/28/16 258 lb 12 oz (117.4 kg)  11/04/15 274 lb (124.3 kg)  10/25/15 272 lb 8 oz (123.6 kg)       Assessment & Plan:  1. COPD exacerbation (HCC) - Provided written and verbal information regarding diagnosis and treatment. - RTC precautions reviewed - amoxicillin-clavulanate (AUGMENTIN) 875-125 MG tablet; Take 1 tablet by mouth 2 (two) times daily.  Dispense: 14 tablet; Refill: 0 - predniSONE (DELTASONE) 20 MG tablet; Take 3 x days, then 2 x 3 days, then 1 x 3 days  Dispense: 18 tablet; Refill: 0 - HYDROcodone-homatropine (HYCODAN) 5-1.5 MG/5ML syrup; Take 5 mLs by mouth every 8 (eight) hours as needed for cough.  Dispense: 120 mL; Refill: 0  2. Cough - amoxicillin-clavulanate (AUGMENTIN) 875-125 MG tablet; Take 1 tablet by mouth 2 (two) times daily.  Dispense: 14 tablet; Refill: 0 - predniSONE (DELTASONE) 20 MG tablet; Take 3 x days, then 2 x 3 days, then 1 x 3 days  Dispense: 18 tablet; Refill: 0 - HYDROcodone-homatropine (HYCODAN) 5-1.5 MG/5ML syrup; Take 5 mLs by mouth every 8 (eight) hours as needed for cough.  Dispense: 120 mL; Refill: 0  3. Wheezing - amoxicillin-clavulanate (AUGMENTIN) 875-125 MG tablet; Take 1 tablet by mouth 2 (two) times daily.  Dispense: 14 tablet; Refill: 0 - predniSONE (DELTASONE) 20 MG tablet; Take 3 x days, then 2 x 3 days, then 1 x 3 days  Dispense: 18 tablet; Refill: 0   Olean Reeeborah Gessner, FNP-BC  [facility]  05/28/2016 12:50 PM

## 2016-05-28 NOTE — Progress Notes (Signed)
Pre visit review using our clinic review tool, if applicable. No additional management support is needed unless otherwise documented below in the visit note. 

## 2016-06-12 ENCOUNTER — Other Ambulatory Visit: Payer: Self-pay | Admitting: Family Medicine

## 2016-06-12 NOTE — Telephone Encounter (Signed)
plz phone in. 

## 2016-06-12 NOTE — Telephone Encounter (Signed)
Ok to refill? Last filled 05/05/16 #20 0RF

## 2016-06-12 NOTE — Telephone Encounter (Signed)
Rx called in as directed.   

## 2016-06-24 ENCOUNTER — Other Ambulatory Visit: Payer: Self-pay | Admitting: Family Medicine

## 2016-07-10 ENCOUNTER — Other Ambulatory Visit: Payer: Self-pay | Admitting: Family Medicine

## 2016-07-16 ENCOUNTER — Other Ambulatory Visit: Payer: Self-pay | Admitting: Family Medicine

## 2016-07-17 NOTE — Telephone Encounter (Signed)
plz phone in. 

## 2016-07-17 NOTE — Telephone Encounter (Signed)
Ok to refill? Last filled 06/12/16 #20 0RF. I've scheduled her for 07/24/16

## 2016-07-17 NOTE — Telephone Encounter (Signed)
Rx called in as directed.   

## 2016-07-24 ENCOUNTER — Ambulatory Visit (INDEPENDENT_AMBULATORY_CARE_PROVIDER_SITE_OTHER): Payer: BLUE CROSS/BLUE SHIELD | Admitting: Family Medicine

## 2016-07-24 ENCOUNTER — Encounter: Payer: Self-pay | Admitting: Family Medicine

## 2016-07-24 VITALS — BP 130/84 | HR 88 | Temp 98.1°F | Wt 260.2 lb

## 2016-07-24 DIAGNOSIS — F4322 Adjustment disorder with anxiety: Secondary | ICD-10-CM

## 2016-07-24 DIAGNOSIS — E1142 Type 2 diabetes mellitus with diabetic polyneuropathy: Secondary | ICD-10-CM

## 2016-07-24 DIAGNOSIS — F17200 Nicotine dependence, unspecified, uncomplicated: Secondary | ICD-10-CM | POA: Diagnosis not present

## 2016-07-24 LAB — HEMOGLOBIN A1C
Hgb A1c MFr Bld: 6.2 % — ABNORMAL HIGH (ref <5.7)
Mean Plasma Glucose: 131 mg/dL

## 2016-07-24 LAB — TSH: TSH: 2.27 mIU/L

## 2016-07-24 MED ORDER — GABAPENTIN 300 MG PO CAPS: 300.0000 mg | ORAL_CAPSULE | Freq: Two times a day (BID) | ORAL | 6 refills | Status: DC

## 2016-07-24 MED ORDER — CITALOPRAM HYDROBROMIDE 10 MG PO TABS: 10.0000 mg | ORAL_TABLET | Freq: Every day | ORAL | 6 refills | Status: DC

## 2016-07-24 NOTE — Assessment & Plan Note (Signed)
Endorses rare cigarette.

## 2016-07-24 NOTE — Assessment & Plan Note (Signed)
She has been using lorazepam for increased stress and anxiety with husband's recent diagnosis and illnesses over the last 6 months. Reviewed use, recommend daily medication - will start celexa 10mg  daily, continue lorazepam PRN rec sparing using.  Noted bipolar disorder dx in chart - pt denies history of bipolar disorder. Discussed if bipolar history, celexa could lead to mania. Pt would still like to try medication - will prescribe and she will monitor symptoms very closely.

## 2016-07-24 NOTE — Progress Notes (Signed)
BP 130/84   Pulse 88   Temp 98.1 F (36.7 C) (Oral)   Wt 260 lb 4 oz (118 kg)   LMP 03/08/2013   BMI 49.17 kg/m    CC: anxiety and leg pain Subjective:    Patient ID: Brenda [name], female    DOB: [birth year], 58 y.o.   MRN: [id number]  HPI: Brenda [name] is a 56 y.o. female presenting on 07/24/2016 for Anxiety and Leg Pain   Increase in anxiety since 12/2015 - husband diagnosed with stage 4 lung cancer as well as COPD and recent MI during Christmas. We started ativan 0.5mg  1/2 tab BID PRN #20. This was planned to be temporary course but she has continued to use regularly over the last 6 months. She states this helps her rest - she tries to take at night time. No SI/HI.   Several week h/o sharp burning pain in feet worse at night time. Denies alcohol use. She does take b12 1000 mcg daily.  Endorses cbg's well controlled - 120 in am, 102 in evenings. Compliant with metformin 500mg  daily.  Smoking - significantly down.  Endorses dyspnea has improved. Some head congestion today.   Relevant past medical, surgical, family and social history reviewed and updated as indicated. Interim medical history since our last visit reviewed. Allergies and medications reviewed and updated. Outpatient Medications Prior to Visit  Medication Sig Dispense Refill  . albuterol (PROVENTIL) (2.5 MG/3ML) 0.083% nebulizer solution INHALE 1 VIAL VIA NEBULIZER EVERY 6 HOURS AS NEEDED FOR WHEEZING/SHORT BREATH 150 mL 3  . cetirizine (ZYRTEC) 10 MG tablet Take 10 mg by mouth daily as needed for allergies.    . fluticasone (FLONASE) 50 MCG/ACT nasal spray PLACE 2 SPRAYS INTO BOTH NOSTRILS DAILY. 16 g 6  . fluticasone furoate-vilanterol (BREO ELLIPTA) 200-25 MCG/INH AEPB Inhale 1 puff into the lungs daily. 1 each 11  . guaiFENesin (MUCINEX) 600 MG 12 hr tablet Take 1 tablet (600 mg total) by mouth 2 (two) times daily. 30 tablet 0  . hydrochlorothiazide (HYDRODIURIL) 25 MG tablet TAKE 1 TABLET BY MOUTH ONCE DAILY 90  tablet 3  . HYDROcodone-homatropine (HYCODAN) 5-1.5 MG/5ML syrup Take 5 mLs by mouth every 8 (eight) hours as needed for cough. 120 mL 0  . LORazepam (ATIVAN) 0.5 MG tablet TAKE 1/2 TO 1 TABLET BY MOUTH TWICE A DAY AS NEEDED NEEDS OFFICE VISIT FOR FOLLOW UP 20 tablet 0  . metFORMIN (GLUCOPHAGE) 500 MG tablet TAKE 1 TABLET BY MOUTH AT BEDTIME 30 tablet 6  . nystatin (MYCOSTATIN) 100000 UNIT/ML suspension TAKE 1 TEASPOONFUL BY MOUTH 3 TIMES A DAY FOR 10 DAYS 150 mL 0  . PROAIR HFA 108 (90 Base) MCG/ACT inhaler INHALE 2 PUFFS INTO THE LUNGS EVERY 6 (SIX) HOURS AS NEEDED FOR WHEEZING OR SHORTNESS OF BREATH. 8.5 Inhaler 2  . PROAIR RESPICLICK 108 (90 Base) MCG/ACT AEPB INHALE 2 PUFFS INTO THE LUNGS EVERY 6 HOURS AS NEEDED. DYSPNEA/WHEEZE 2 each 3  . tiotropium (SPIRIVA) 18 MCG inhalation capsule Place 1 capsule (18 mcg total) into inhaler and inhale daily. 30 capsule 12  . amoxicillin-clavulanate (AUGMENTIN) 875-125 MG tablet Take 1 tablet by mouth 2 (two) times daily. 14 tablet 0  . predniSONE (DELTASONE) 20 MG tablet Take 3 x days, then 2 x 3 days, then 1 x 3 days 18 tablet 0   No facility-administered medications prior to visit.      Per HPI unless specifically indicated in ROS section below Review of Systems  Objective:    BP 130/84   Pulse 88   Temp 98.1 F (36.7 C) (Oral)   Wt 260 lb 4 oz (118 kg)   LMP 03/08/2013   BMI 49.17 kg/m   Wt Readings from Last 3 Encounters:  07/24/16 260 lb 4 oz (118 kg)  05/28/16 258 lb 12 oz (117.4 kg)  11/04/15 274 lb (124.3 kg)    Physical Exam  Constitutional: She appears well-developed and well-nourished. No distress.  HENT:  Head: Normocephalic and atraumatic.  Right Ear: External ear normal.  Left Ear: External ear normal.  Nose: Nose normal.  Mouth/Throat: Oropharynx is clear and moist. No oropharyngeal exudate.  Eyes: Conjunctivae and EOM are normal. Pupils are equal, round, and reactive to light. No scleral icterus.  Neck: Normal  range of motion. Neck supple.  Cardiovascular: Normal rate, regular rhythm, normal heart sounds and intact distal pulses.   No murmur heard. Pulmonary/Chest: Effort normal and breath sounds normal. No respiratory distress. She has no wheezes. She has no rales.  Musculoskeletal: She exhibits no edema.  See HPI for foot exam if done  Lymphadenopathy:    She has no cervical adenopathy.  Skin: Skin is warm and dry. No rash noted.  Psychiatric: She has a normal mood and affect.  Tearful with discussion of husband's illness and current stressors  Nursing note and vitals reviewed.  Results for orders placed or performed in visit on 11/06/15  Fecal Occult Blood, Guaiac  Result Value Ref Range   Fecal Occult Blood Negative    Lab Results  Component Value Date   HGBA1C 6.8 (H) 10/18/2015  No results found for: [id number]     Assessment & Plan:  Patient carries diagnosis of bipolar disorder type 1 - she denies history of this diagnosis. will remove from problem list. See below.  Problem List Items Addressed This Visit    Adjustment disorder with anxiety - Primary    She has been using lorazepam for increased stress and anxiety with husband's recent diagnosis and illnesses over the last 6 months. Reviewed use, recommend daily medication - will start celexa 10mg  daily, continue lorazepam PRN rec sparing using.  Noted bipolar disorder dx in chart - pt denies history of bipolar disorder. Discussed if bipolar history, celexa could lead to mania. Pt would still like to try medication - will prescribe and she will monitor symptoms very closely.       Smoker    Endorses rare cigarette.       Type 2 diabetes, controlled, with peripheral neuropathy (HCC)    Chronic, stable. Update A1c.  Endorsing worsening peripheral neuropathy - will start gabapentin 300mg  QHS with option to increase to BID.  Check TSH, B12 today.       Relevant Medications   citalopram (CELEXA) 10 MG tablet   gabapentin  (NEURONTIN) 300 MG capsule   Other Relevant Orders   TSH   Hemoglobin A1c   Vitamin B12       Follow up plan: Return in about 4 months (around 11/21/2016) for annual exam, prior fasting for blood work.  Eustaquio BoydenJavier Corrigan Kretschmer, MD

## 2016-07-24 NOTE — Patient Instructions (Addendum)
Start celexa 10mg  daily in the mornings.  May continue ativan but use sparingly as needed for anxiety.  Take gabapentin 300mg  nightly, may increase to twice daily if needed. Labs today.  Return in 4 months for physical.

## 2016-07-24 NOTE — Assessment & Plan Note (Addendum)
Chronic, stable. Update A1c.  Endorsing worsening peripheral neuropathy - will start gabapentin 300mg  QHS with option to increase to BID.  Check TSH, B12 today.

## 2016-07-24 NOTE — Progress Notes (Signed)
Pre visit review using our clinic review tool, if applicable. No additional management support is needed unless otherwise documented below in the visit note. 

## 2016-07-25 ENCOUNTER — Other Ambulatory Visit: Payer: Self-pay | Admitting: Family Medicine

## 2016-07-25 LAB — VITAMIN B12: Vitamin B-12: 1186 pg/mL — ABNORMAL HIGH (ref 200–1100)

## 2016-07-25 MED ORDER — CYANOCOBALAMIN 500 MCG PO TABS
500.0000 ug | ORAL_TABLET | Freq: Every day | ORAL | Status: DC
Start: 2016-07-25 — End: 2017-07-08

## 2016-08-07 ENCOUNTER — Encounter: Payer: Self-pay | Admitting: Internal Medicine

## 2016-08-07 ENCOUNTER — Ambulatory Visit (INDEPENDENT_AMBULATORY_CARE_PROVIDER_SITE_OTHER): Payer: BLUE CROSS/BLUE SHIELD | Admitting: Internal Medicine

## 2016-08-07 VITALS — BP 122/88 | HR 98 | Temp 97.6°F | Ht 63.0 in | Wt 256.0 lb

## 2016-08-07 DIAGNOSIS — J441 Chronic obstructive pulmonary disease with (acute) exacerbation: Secondary | ICD-10-CM | POA: Diagnosis not present

## 2016-08-07 DIAGNOSIS — I10 Essential (primary) hypertension: Secondary | ICD-10-CM

## 2016-08-07 DIAGNOSIS — R059 Cough, unspecified: Secondary | ICD-10-CM

## 2016-08-07 DIAGNOSIS — J019 Acute sinusitis, unspecified: Secondary | ICD-10-CM

## 2016-08-07 DIAGNOSIS — R05 Cough: Secondary | ICD-10-CM

## 2016-08-07 MED ORDER — METHYLPREDNISOLONE ACETATE 80 MG/ML IJ SUSP
80.0000 mg | Freq: Once | INTRAMUSCULAR | Status: AC
  Administered 2016-08-07: 80 mg via INTRAMUSCULAR

## 2016-08-07 MED ORDER — LEVOFLOXACIN 500 MG PO TABS
500.0000 mg | ORAL_TABLET | Freq: Every day | ORAL | 0 refills | Status: AC
Start: 2016-08-07 — End: 2016-08-17

## 2016-08-07 MED ORDER — HYDROCODONE-HOMATROPINE 5-1.5 MG/5ML PO SYRP: 5.0000 mL | ORAL_SOLUTION | Freq: Three times a day (TID) | ORAL | 0 refills | Status: DC | PRN

## 2016-08-07 MED ORDER — PREDNISONE 10 MG PO TABS: ORAL_TABLET | ORAL | 0 refills | Status: DC

## 2016-08-07 NOTE — Progress Notes (Signed)
Subjective:    Patient ID: Brenda [name], female    DOB: 09-09-1960, 56 y.o.   MRN: [id number]  HPI   Here with 2-3 days acute onset fever, facial pain, pressure, headache, general weakness and malaise, and greenish d/c, with mild ST and cough, but pt denies chest pain, wheezing, increased sob or doe, orthopnea, PND, increased LE swelling, palpitations, dizziness or syncope, except for mild worsening wheezing/sob/doe since last Pm.  Pt denies new neurological symptoms such as new headache, or facial or extremity weakness or numbness   Pt denies polydipsia, polyuria, Past Medical History:  Diagnosis Date  . Acute pancreatitis   . Asthma    main dyspnea thought due to deconditioning (10/2013)  . Bipolar I disorder, most recent episode (or current) manic, unspecified    pt denies this  . Bronchitis, chronic obstructive (HCC) 2008 & 2009   FeV1 64% TLC 105% DLCO 55% 2008  -  FeV1 81% FeF 25-75 43% 2009  . COPD (chronic obstructive pulmonary disease) (HCC)    emphysema, not an issue per pulm (10/2013)  . History of pyelonephritis 05/2012  . HTN (hypertension)   . Hx of migraines   . Knee pain, right   . Leukocytosis, unspecified   . Lumbar disc disease with radiculopathy    multilevel spondylitic changes, scoliosis, and anterolisthesis L4 not thought to currently be surg candidate (Dr. Phoebe PerchHirsch, [facility]) (Dr. O[facility]arkins, [facility])  . Nicotine addiction   . Nonspecific abnormal electrocardiogram (ECG) (EKG)   . Obesity   . Pure hyperglyceridemia   . Suicide attempt 06/2001  . Swelling of limb   . Urge and stress incontinence 07/2012   ([facility])   Past Surgical History:  Procedure Laterality Date  . CHOLECYSTECTOMY  07/1982  . CT MAXILLOFACIAL WO/W CM  06/14/06   Left max mucocele  . CT of chest  06/14/2006   Normal except c/w active inflammation / infection.  Left renal atrophy  . ERCP / sphincterotomy - stenosis  01/15/06  . lumbar MRI  11/2010   multilevel spondylitic changes with upper  lumbar scoliosis and anterolisthesis of L4 on 5 with some L foraminal narrowing esp at L/3 with concavity of scoliosis, some central stenosis and biforaminal narrowing at L4/5 with spondylolisthesis  . [place] - Pneumonia, acute asthma, left maxillary sinusitis  01/11 - 06/18/2006  . Retained stone     ERCP secondary to retained stone  . Right knee surgery  1984,1989,1989,1991,1994   Reconstructions for ACL insuff  . TUBAL LIGATION      reports that she quit smoking about 12 months ago. Her smoking use included Cigarettes. She has a 15.00 pack-year smoking history. She has never used smokeless tobacco. She reports that she does not drink alcohol or use drugs. family history includes Asthma in her father; Breast cancer in her paternal grandmother; Heart disease in her brother; Hyperlipidemia in her mother; Hypertension in her father and mother. Allergies  Allergen Reactions  . Metolazone     REACTION: metabolic mood swings   Current Outpatient Prescriptions on File Prior to Visit  Medication Sig Dispense Refill  . albuterol (PROVENTIL) (2.5 MG/3ML) 0.083% nebulizer solution INHALE 1 VIAL VIA NEBULIZER EVERY 6 HOURS AS NEEDED FOR WHEEZING/SHORT BREATH 150 mL 3  . cetirizine (ZYRTEC) 10 MG tablet Take 10 mg by mouth daily as needed for allergies.    . citalopram (CELEXA) 10 MG tablet Take 1 tablet (10 mg total) by mouth daily. 30 tablet 6  . cyanocobalamin 500  MCG tablet Take 1 tablet (500 mcg total) by mouth daily. Vit B12    . fluticasone (FLONASE) 50 MCG/ACT nasal spray PLACE 2 SPRAYS INTO BOTH NOSTRILS DAILY. 16 g 6  . fluticasone furoate-vilanterol (BREO ELLIPTA) 200-25 MCG/INH AEPB Inhale 1 puff into the lungs daily. 1 each 11  . gabapentin (NEURONTIN) 300 MG capsule Take 1 capsule (300 mg total) by mouth 2 (two) times daily. 60 capsule 6  . guaiFENesin (MUCINEX) 600 MG 12 hr tablet Take 1 tablet (600 mg total) by mouth 2 (two) times daily. 30 tablet 0  . hydrochlorothiazide  (HYDRODIURIL) 25 MG tablet TAKE 1 TABLET BY MOUTH ONCE DAILY 90 tablet 3  . LORazepam (ATIVAN) 0.5 MG tablet TAKE 1/2 TO 1 TABLET BY MOUTH TWICE A DAY AS NEEDED NEEDS OFFICE VISIT FOR FOLLOW UP 20 tablet 0  . metFORMIN (GLUCOPHAGE) 500 MG tablet TAKE 1 TABLET BY MOUTH AT BEDTIME 30 tablet 6  . nystatin (MYCOSTATIN) 100000 UNIT/ML suspension TAKE 1 TEASPOONFUL BY MOUTH 3 TIMES A DAY FOR 10 DAYS 150 mL 0  . PROAIR HFA 108 (90 Base) MCG/ACT inhaler INHALE 2 PUFFS INTO THE LUNGS EVERY 6 (SIX) HOURS AS NEEDED FOR WHEEZING OR SHORTNESS OF BREATH. 8.5 Inhaler 2  . PROAIR RESPICLICK 108 (90 Base) MCG/ACT AEPB INHALE 2 PUFFS INTO THE LUNGS EVERY 6 HOURS AS NEEDED. DYSPNEA/WHEEZE 2 each 3  . tiotropium (SPIRIVA) 18 MCG inhalation capsule Place 1 capsule (18 mcg total) into inhaler and inhale daily. 30 capsule 12   No current facility-administered medications on file prior to visit.     Review of Systems  Constitutional: Negative for unusual diaphoresis or night sweats HENT: Negative for ear swelling or discharge Eyes: Negative for worsening visual haziness  Respiratory: Negative for choking and stridor.   Gastrointestinal: Negative for distension or worsening eructation Genitourinary: Negative for retention or change in urine volume.  Musculoskeletal: Negative for other MSK pain or swelling Skin: Negative for color change and worsening wound Neurological: Negative for tremors and numbness other than noted  Psychiatric/Behavioral: Negative for decreased concentration or agitation other than above   All other system neg per pt    Objective:   Physical Exam BP 122/88   Pulse 98   Temp 97.6 F (36.4 C)   Ht 5\' 3"  (1.6 m)   Wt 256 lb (116.1 kg)   LMP 03/08/2013   SpO2 97%   BMI 45.35 kg/m  VS noted, mild ill Constitutional: Pt appears in no apparent distress HENT: Head: NCAT.  Right Ear: External ear normal.  Left Ear: External ear normal.  Eyes: . Pupils are equal, round, and reactive to  light. Conjunctivae and EOM are normal Bilat tm's with mild erythema.  Max sinus areas mild tender.  Pharynx with mild erythema, no exudate Neck: Normal range of motion. Neck supple.  Cardiovascular: Normal rate and regular rhythm.   Pulmonary/Chest: Effort normal and breath sounds decreased without rales but with few scattered wheezing.  Neurological: Pt is alert. Not confused , motor grossly intact Skin: Skin is warm. No rash, no LE edema Psychiatric: Pt behavior is normal. No agitation.  No other new exam findings    Assessment & Plan:

## 2016-08-07 NOTE — Patient Instructions (Signed)
You had the steroid shot today  Please take all new medication as prescribed - the antibiotic, cough medicine, and prednisone  Please continue all other medications as before, including the inhalers  Please have the pharmacy call with any other refills you may need.  Please keep your appointments with your specialists as you may have planned     

## 2016-08-08 NOTE — Assessment & Plan Note (Signed)
Mild to mod, for antibx course,  to f/u any worsening symptoms or concerns 

## 2016-08-08 NOTE — Assessment & Plan Note (Signed)
Mild, for depomedrol IM 80, predpac asd,  to f/u any worsening symptoms or concerns 

## 2016-08-08 NOTE — Assessment & Plan Note (Signed)
stable overall by history and exam, recent data reviewed with pt, and pt to continue medical treatment as before,  to f/u any worsening symptoms or concerns BP Readings from Last 3 Encounters:  08/07/16 122/88  07/24/16 130/84  05/28/16 134/82

## 2016-08-15 ENCOUNTER — Other Ambulatory Visit: Payer: Self-pay | Admitting: Family Medicine

## 2016-09-14 ENCOUNTER — Other Ambulatory Visit: Payer: Self-pay | Admitting: Family Medicine

## 2016-09-14 NOTE — Telephone Encounter (Signed)
Ok to refill? Last filled 07/17/16 #20 0RF

## 2016-09-15 NOTE — Telephone Encounter (Signed)
plz phone in. 

## 2016-09-16 NOTE — Telephone Encounter (Signed)
Rx called in as directed.   

## 2016-09-17 ENCOUNTER — Other Ambulatory Visit: Payer: Self-pay | Admitting: *Deleted

## 2016-09-17 MED ORDER — CITALOPRAM HYDROBROMIDE 10 MG PO TABS: 10.0000 mg | ORAL_TABLET | Freq: Every day | ORAL | 0 refills | Status: DC

## 2016-10-27 ENCOUNTER — Other Ambulatory Visit: Payer: Self-pay | Admitting: Family Medicine

## 2016-10-27 NOTE — Telephone Encounter (Signed)
Received refill electronically Last refill 12/05/15 # 90/3 Last office visit 07/24/16 See allergy/contraindication

## 2016-11-04 ENCOUNTER — Other Ambulatory Visit: Payer: Self-pay | Admitting: Family Medicine

## 2016-11-13 ENCOUNTER — Other Ambulatory Visit: Payer: Self-pay | Admitting: Internal Medicine

## 2016-11-13 ENCOUNTER — Other Ambulatory Visit: Payer: Self-pay | Admitting: Family Medicine

## 2016-11-16 ENCOUNTER — Ambulatory Visit: Payer: Self-pay | Admitting: Internal Medicine

## 2016-11-16 ENCOUNTER — Telehealth: Payer: Self-pay

## 2016-11-16 ENCOUNTER — Telehealth: Payer: Self-pay | Admitting: Family Medicine

## 2016-11-16 ENCOUNTER — Encounter: Payer: Self-pay | Admitting: Family Medicine

## 2016-11-16 ENCOUNTER — Ambulatory Visit (INDEPENDENT_AMBULATORY_CARE_PROVIDER_SITE_OTHER): Payer: BLUE CROSS/BLUE SHIELD | Admitting: Family Medicine

## 2016-11-16 VITALS — BP 110/82 | HR 70 | Temp 98.3°F | Wt 267.0 lb

## 2016-11-16 DIAGNOSIS — R05 Cough: Secondary | ICD-10-CM | POA: Diagnosis not present

## 2016-11-16 DIAGNOSIS — J441 Chronic obstructive pulmonary disease with (acute) exacerbation: Secondary | ICD-10-CM | POA: Diagnosis not present

## 2016-11-16 DIAGNOSIS — R059 Cough, unspecified: Secondary | ICD-10-CM

## 2016-11-16 DIAGNOSIS — R062 Wheezing: Secondary | ICD-10-CM

## 2016-11-16 MED ORDER — ALBUTEROL SULFATE (2.5 MG/3ML) 0.083% IN NEBU
2.5000 mg | INHALATION_SOLUTION | Freq: Once | RESPIRATORY_TRACT | Status: AC
  Administered 2016-11-16: 2.5 mg via RESPIRATORY_TRACT

## 2016-11-16 MED ORDER — PREDNISONE 20 MG PO TABS: ORAL_TABLET | ORAL | 0 refills | Status: DC

## 2016-11-16 MED ORDER — HYDROCODONE-HOMATROPINE 5-1.5 MG/5ML PO SYRP: 5.0000 mL | ORAL_SOLUTION | Freq: Three times a day (TID) | ORAL | 0 refills | Status: DC | PRN

## 2016-11-16 MED ORDER — AMOXICILLIN-POT CLAVULANATE 875-125 MG PO TABS: 1.0000 | ORAL_TABLET | Freq: Two times a day (BID) | ORAL | 0 refills | Status: DC

## 2016-11-16 NOTE — Addendum Note (Signed)
Addended by: Carola RhineKIGOTHO, NANCY N on: 11/16/2016 01:42 PM   Modules accepted: Orders

## 2016-11-16 NOTE — Telephone Encounter (Signed)
Pt has appt 11/16/16 at 12 noon with Roddie McJulia Kordsmeier FNP.

## 2016-11-16 NOTE — Patient Instructions (Signed)
Please take medications as directed with food and follow up with your provider if symptoms do not improve with treatment, worsen, or you develop new symptoms such as fever and increased SOB.   Chronic Obstructive Pulmonary Disease Exacerbation Chronic obstructive pulmonary disease (COPD) is a common lung problem. In COPD, the flow of air from the lungs is limited. COPD exacerbations are times that breathing gets worse and you need extra treatment. Without treatment they can be life threatening. If they happen often, your lungs can become more damaged. If your COPD gets worse, your doctor may treat you with:  Medicines.  Oxygen.  Different ways to clear your airway, such as using a mask.  Follow these instructions at home:  Do not smoke.  Avoid tobacco smoke and other things that bother your lungs.  If given, take your antibiotic medicine as told. Finish the medicine even if you start to feel better.  Only take medicines as told by your doctor.  Drink enough fluids to keep your pee (urine) clear or pale yellow (unless your doctor has told you not to).  Use a cool mist machine (vaporizer).  If you use oxygen or a machine that turns liquid medicine into a mist (nebulizer), continue to use them as told.  Keep up with shots (vaccinations) as told by your doctor.  Exercise regularly.  Eat healthy foods.  Keep all doctor visits as told. Get help right away if:  You are very short of breath and it gets worse.  You have trouble talking.  You have bad chest pain.  You have blood in your spit (sputum).  You have a fever.  You keep throwing up (vomiting).  You feel weak, or you pass out (faint).  You feel confused.  You keep getting worse. This information is not intended to replace advice given to you by your health care provider. Make sure you discuss any questions you have with your health care provider. Document Released: 05/07/2011 Document Revised: 10/24/2015 Document  Reviewed: 01/20/2013 Elsevier Interactive Patient Education  2017 [employer]

## 2016-11-16 NOTE — Telephone Encounter (Signed)
Pharmacy calling because information that was left on RX voicemail today is unclear regarding R/X (alprazolam) to be called in.    Nurse did not leave her name on message and pharmacy needs clarification before filling.  Will investigate with CMA working today to verify and return call to pharmacy in the am.

## 2016-11-16 NOTE — Progress Notes (Signed)
Subjective:    Patient ID: Festus BarrenJudy L Dinges, female    DOB: 30-Jan-1961, 56 y.o.   MRN: [id number]  HPI  [name] is a 56 year old female who presents today with a cough with clear sputum for 12 days.  Associated nasal congestion, post nasal drip, and right ear pressure/pain.  Prior sore throat that has improved.  Intermittent SOB with exertion, and wheezing are present. She denies fever, chills, sweats. History of COPD/asthma.   Smokes "once in a while" and reports this as once/twice weekly.   Treatment: Albuterol use 2 to 3 times/day, flonase, zyrtec,  and robitussin.   She uses spiriva and breo as prescribed by her provided. 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 She reports taking care of her husband with Stage IV Lung Cancer and notes that she is trying to stop smoking completely.   Review of Systems  Constitutional: Negative for chills, fatigue and fever.  HENT: Positive for congestion, postnasal drip, rhinorrhea and sore throat. Negative for sinus pain, sinus pressure and sneezing.   Respiratory: Positive for cough, shortness of breath and wheezing.   Cardiovascular: Negative for chest pain and palpitations.  Gastrointestinal: Negative for abdominal pain, constipation, diarrhea, nausea and vomiting.  Skin: Negative for rash.  Neurological: Negative for dizziness, weakness, light-headedness and headaches.   Past Medical History:  Diagnosis Date  . Acute pancreatitis   . Asthma    main dyspnea thought due to deconditioning (10/2013)  . Bipolar I disorder, most recent episode (or current) manic, unspecified    pt denies this  . Bronchitis, chronic obstructive (HCC) 2008 & 2009   FeV1 64% TLC 105% DLCO 55% 2008  -  FeV1 81% FeF 25-75 43% 2009  . COPD (chronic obstructive pulmonary disease) (HCC)    emphysema, not an issue per pulm (10/2013)  . History of pyelonephritis 05/2012  . HTN (hypertension)   . Hx of migraines   .  Knee pain, right   . Leukocytosis, unspecified   . Lumbar disc disease with radiculopathy    multilevel spondylitic changes, scoliosis, and anterolisthesis L4 not thought to currently be surg candidate (Dr. Phoebe PerchHirsch, [facility]) (Dr. Ollen BowlHarkins, [facility])  . Nicotine addiction   . Nonspecific abnormal electrocardiogram (ECG) (EKG)   . Obesity   . Pure hyperglyceridemia   . Suicide attempt (HCC) 06/2001  . Swelling of limb   . Urge and stress incontinence 07/2012   ([facility])     Social History   Social History  . Marital status: Married    Spouse name: N/A  . Number of children: 3  . Years of education: N/A   Occupational History  . Nursing Secretary [employer]   .  [employer]    Social History Main Topics  . Smoking status: Former Smoker    Packs/day: 0.50    Years: 30.00    Types: Cigarettes    Quit date: 07/17/2015  . Smokeless tobacco: Never Used  . Alcohol use No  . Drug use: No  . Sexual activity: Not on file   Other Topics Concern  . Not on file   Social History Narrative   Lives with husband and son and daughter in [place] and grandchild   Occupation: Media plannermedical secretary at [employer]   Activity: limited by dyspnea   Diet: good water, fruits/vegetables daily    Past Surgical History:  Procedure Laterality Date  . CHOLECYSTECTOMY  07/1982  . CT  MAXILLOFACIAL WO/W CM  06/14/06   Left max mucocele  . CT of chest  06/14/2006   Normal except c/w active inflammation / infection.  Left renal atrophy  . ERCP / sphincterotomy - stenosis  01/15/06  . lumbar MRI  11/2010   multilevel spondylitic changes with upper lumbar scoliosis and anterolisthesis of L4 on 5 with some L foraminal narrowing esp at L/3 with concavity of scoliosis, some central stenosis and biforaminal narrowing at L4/5 with spondylolisthesis  . [place] - Pneumonia, acute asthma, left maxillary sinusitis  01/11 - 06/18/2006  . Retained stone     ERCP secondary to retained stone    . Right knee surgery  1984,1989,1989,1991,1994   Reconstructions for ACL insuff  . TUBAL LIGATION      Family History  Problem Relation Age of Onset  . Hypertension Mother   . Hyperlipidemia Mother   . Hypertension Father   . Heart disease Brother        MI in early 89's  . Asthma Father   . Breast cancer Paternal Grandmother     Allergies  Allergen Reactions  . Metolazone     REACTION: metabolic mood swings    Current Outpatient Prescriptions on File Prior to Visit  Medication Sig Dispense Refill  . albuterol (PROVENTIL) (2.5 MG/3ML) 0.083% nebulizer solution INHALE 1 VIAL VIA NEBULIZER EVERY 6 HOURS AS NEEDED FOR WHEEZING/SHORT BREATH 150 mL 3  . BREO ELLIPTA 200-25 MCG/INH AEPB INHALE 1 PUFF INTO THE LUNGS DAILY. 60 each 5  . cetirizine (ZYRTEC) 10 MG tablet Take 10 mg by mouth daily as needed for allergies.    . citalopram (CELEXA) 10 MG tablet Take 1 tablet (10 mg total) by mouth daily. 90 tablet 0  . cyanocobalamin 500 MCG tablet Take 1 tablet (500 mcg total) by mouth daily. Vit B12    . fluticasone (FLONASE) 50 MCG/ACT nasal spray PLACE 2 SPRAYS INTO BOTH NOSTRILS DAILY. 16 g 6  . gabapentin (NEURONTIN) 300 MG capsule Take 1 capsule (300 mg total) by mouth 2 (two) times daily. 60 capsule 6  . guaiFENesin (MUCINEX) 600 MG 12 hr tablet Take 1 tablet (600 mg total) by mouth 2 (two) times daily. 30 tablet 0  . hydrochlorothiazide (HYDRODIURIL) 25 MG tablet TAKE 1 TABLET BY MOUTH ONCE DAILY 90 tablet 1  . LORazepam (ATIVAN) 0.5 MG tablet TAKE 1/2 TO 1 TABLET BY MOUTH TWICE A DAY AS NEEDED 20 tablet 0  . metFORMIN (GLUCOPHAGE) 500 MG tablet TAKE 1 TABLET BY MOUTH AT BEDTIME 30 tablet 6  . nystatin (MYCOSTATIN) 100000 UNIT/ML suspension TAKE 1 TEASPOONFUL BY MOUTH 3 TIMES A DAY FOR 10 DAYS 150 mL 0  . PROAIR HFA 108 (90 Base) MCG/ACT inhaler INHALE 2 PUFFS INTO THE LUNGS EVERY 6 (SIX) HOURS AS NEEDED FOR WHEEZING OR SHORTNESS OF BREATH. 8.5 Inhaler 2  . PROAIR RESPICLICK 108  (90 Base) MCG/ACT AEPB INHALE 2 PUFFS INTO THE LUNGS EVERY 6 HOURS AS NEEDED. DYSPNEA/WHEEZE 2 each 3  . SPIRIVA HANDIHALER 18 MCG inhalation capsule PLACE 1 CAPSULE INTO INHALER AND INHALE DAILY. 30 capsule 11   No current facility-administered medications on file prior to visit.     BP 110/82 (BP Location: Left Arm, Patient Position: Sitting, Cuff Size: Large)   Pulse 70   Temp 98.3 F (36.8 C) (Oral)   Wt 267 lb (121.1 kg)   LMP 03/08/2013   SpO2 94%   BMI 47.30 kg/m       Objective:  Physical Exam  Constitutional: She is oriented to person, place, and time. She appears well-developed and well-nourished.  Eyes: Pupils are equal, round, and reactive to light. No scleral icterus.  Neck: Neck supple.  Cardiovascular: Normal rate and regular rhythm.   Pulmonary/Chest: Effort normal. She has wheezes in the right upper field, the right middle field, the left upper field and the left middle field. She has no rhonchi. She has no rales.  Abdominal: Soft. Bowel sounds are normal. There is no tenderness.  Musculoskeletal: She exhibits no edema.  Lymphadenopathy:    She has no cervical adenopathy.  Neurological: She is alert and oriented to person, place, and time. Coordination normal.  Skin: Skin is warm and dry. No rash noted.  Psychiatric: She has a normal mood and affect. Her behavior is normal. Judgment and thought content normal.       Assessment & Plan:  1. COPD exacerbation (HCC) Able to converse in complete sentences; nebulizer treatment provided; pulse oximeter reading after treatment 95% and RR 16. Provide antibiotic and chose Augmentin due to risk factor of employment in nursing home. - amoxicillin-clavulanate (AUGMENTIN) 875-125 MG tablet; Take 1 tablet by mouth 2 (two) times daily.  Dispense: 20 tablet; Refill: 0 - predniSONE (DELTASONE) 20 MG tablet; Take 2 tablets once daily with food  Dispense: 14 tablet; Refill: 0  2. Cough  - HYDROcodone-homatropine (HYCODAN)  5-1.5 MG/5ML syrup; Take 5 mLs by mouth every 8 (eight) hours as needed for cough.  Dispense: 120 mL; Refill: 0  3. Wheezing  - predniSONE (DELTASONE) 20 MG tablet; Take 2 tablets once daily with food  Dispense: 14 tablet; Refill: 0  Written information provided regarding COPD exacerbation and return precautions reviewed.  Follow up with PCP if no improvement.  Roddie Mc, FNP-C

## 2016-11-16 NOTE — Telephone Encounter (Signed)
Patient Name: Brenda LeepJUDY Brill  DOB: 10-Oct-1960    Initial Comment Caller States she is coughing up a clear drainage, shortness of breath   Nurse Assessment  Nurse: Stefano GaulStringer, RN, Vera Date/Time (Eastern Time): 11/16/2016 8:36:38 AM  Confirm and document reason for call. If symptomatic, describe symptoms. ---Caller states she is coughing up a lot of clear drainage. No fever. sinus congestion. She is using her inhalers and breathing treatments. Has some wheezing. SOB with any exertion.  Does the patient have any new or worsening symptoms? ---Yes  Will a triage be completed? ---Yes  Related visit to physician within the last 2 weeks? ---No  Does the PT have any chronic conditions? (i.e. diabetes, asthma, etc.) ---Yes  List chronic conditions. ---COPD; asthma  Is this a behavioral health or substance abuse call? ---No     Guidelines    Guideline Title Affirmed Question Affirmed Notes  Cough - Acute Productive Wheezing is present    Final Disposition User   See Physician within 4 Hours (or PCP triage) Stefano GaulStringer, RN, Vera    Comments  [facility]. appt scheduled at [facility] on 11/16/2016 at 9:45 am with Dr. Berniece AndreasWanda Panosh   Referrals  REFERRED TO PCP OFFICE   Disagree/Comply: Comply

## 2016-11-17 ENCOUNTER — Other Ambulatory Visit: Payer: Self-pay | Admitting: *Deleted

## 2016-11-17 MED ORDER — LORAZEPAM 0.5 MG PO TABS: ORAL_TABLET | ORAL | 0 refills | Status: DC

## 2016-11-17 NOTE — Telephone Encounter (Signed)
Spoke with Pharmacist Christy to Clarify order

## 2016-12-17 ENCOUNTER — Other Ambulatory Visit: Payer: Self-pay | Admitting: Family Medicine

## 2016-12-18 NOTE — Telephone Encounter (Signed)
Rx called in to pharmacy. 

## 2016-12-18 NOTE — Telephone Encounter (Signed)
plz phone in. 

## 2016-12-23 ENCOUNTER — Other Ambulatory Visit: Payer: Self-pay | Admitting: Family Medicine

## 2017-01-03 ENCOUNTER — Other Ambulatory Visit: Payer: Self-pay | Admitting: Family Medicine

## 2017-01-04 ENCOUNTER — Telehealth: Payer: Self-pay | Admitting: Family Medicine

## 2017-01-04 ENCOUNTER — Other Ambulatory Visit: Payer: Self-pay | Admitting: Family Medicine

## 2017-01-04 NOTE — Telephone Encounter (Signed)
Received refill electronically Last refill 12/18/16 #20 Last office visit 08/07/16-acute

## 2017-01-04 NOTE — Telephone Encounter (Signed)
Patient scheduled appointment for a cpx on 01/06/17 at 5:30.

## 2017-01-04 NOTE — Telephone Encounter (Signed)
Received a refill request for pt's rescue inhaler. Looks like pt is due for a 4 month physical appointment.   Attempted to reach patient. No answer. Left message on machine to call back.

## 2017-01-04 NOTE — Telephone Encounter (Signed)
Rx has been sent to pharmacy; see additional phone note

## 2017-01-05 NOTE — Telephone Encounter (Signed)
Rx called to pharmacy as instructed. 

## 2017-01-05 NOTE — Telephone Encounter (Signed)
plz phone in. 

## 2017-01-06 ENCOUNTER — Encounter: Payer: BLUE CROSS/BLUE SHIELD | Admitting: Family Medicine

## 2017-01-06 ENCOUNTER — Other Ambulatory Visit: Payer: Self-pay | Admitting: Family Medicine

## 2017-01-07 ENCOUNTER — Other Ambulatory Visit: Payer: Self-pay | Admitting: Family Medicine

## 2017-01-08 ENCOUNTER — Other Ambulatory Visit: Payer: Self-pay | Admitting: Family Medicine

## 2017-01-12 ENCOUNTER — Ambulatory Visit (INDEPENDENT_AMBULATORY_CARE_PROVIDER_SITE_OTHER): Payer: BLUE CROSS/BLUE SHIELD | Admitting: Primary Care

## 2017-01-12 ENCOUNTER — Encounter: Payer: Self-pay | Admitting: Primary Care

## 2017-01-12 VITALS — BP 122/72 | HR 88 | Temp 99.1°F | Ht 63.0 in | Wt 262.4 lb

## 2017-01-12 DIAGNOSIS — R350 Frequency of micturition: Secondary | ICD-10-CM

## 2017-01-12 DIAGNOSIS — J020 Streptococcal pharyngitis: Secondary | ICD-10-CM | POA: Diagnosis not present

## 2017-01-12 DIAGNOSIS — J029 Acute pharyngitis, unspecified: Secondary | ICD-10-CM

## 2017-01-12 LAB — POC URINALSYSI DIPSTICK (AUTOMATED)
Bilirubin, UA: NEGATIVE
Glucose, UA: NEGATIVE
Ketones, UA: NEGATIVE
Nitrite, UA: NEGATIVE
Spec Grav, UA: >=1.03 — AB (ref 1.010–1.025)
Urobilinogen, UA: 0.2 E.U./dL
pH, UA: 6 (ref 5.0–8.0)

## 2017-01-12 LAB — POCT RAPID STREP A (OFFICE): Rapid Strep A Screen: POSITIVE — AB

## 2017-01-12 MED ORDER — AMOXICILLIN-POT CLAVULANATE 875-125 MG PO TABS: 1.0000 | ORAL_TABLET | Freq: Two times a day (BID) | ORAL | 0 refills | Status: DC

## 2017-01-12 MED ORDER — SULFAMETHOXAZOLE-TRIMETHOPRIM 800-160 MG PO TABS: 1.0000 | ORAL_TABLET | Freq: Two times a day (BID) | ORAL | 0 refills | Status: DC

## 2017-01-12 NOTE — Addendum Note (Signed)
Addended by: Doreene NestLARK, Kaylean Tupou K on: 01/12/2017 12:07 PM   Modules accepted: Orders

## 2017-01-12 NOTE — Patient Instructions (Signed)
Start Bactrim DS (sulfamethoxazole/trimethoprim) tablets for urinary tract infection. Take 1 tablet by mouth twice daily for 5 days.  Continue Advil for fevers, consider alternating with tylenol.  Ensure you are staying hydrated with water.  Please call me if no improvement in 3-4 days.  It was a pleasure meeting you!

## 2017-01-12 NOTE — Addendum Note (Signed)
Addended by: Tawnya CrookSAMBATH, Ashelyn Mccravy on: 01/12/2017 12:31 PM   Modules accepted: Orders

## 2017-01-12 NOTE — Progress Notes (Addendum)
Subjective:    Patient ID: Brenda [name], female    DOB: [birth year], 56 y.o.   MRN: [id number]  HPI  Brenda [name] is a 56 year old female with a history of COPD, type 2 diabetes who presents today with a chief complaint of fevers. She also reports fatigue, body aches, urinary frequency, dysuria, generalized abdominal pain. Her fever and nausea symptoms began two days ago, urinary symptoms began three days ago. Her highest fever was 102.7 three days ago, was ranging around 101 until last night, now 99. She's been taking Advil for fevers, last dose at 6 am this morning. Used albuterol inhaler once but didn't notice much of a difference. Overall cough isn't bad and mostly non productive.  She was last treated with antibiotics in mid June 2018 for COPD exacerbation.  Review of Systems  Constitutional: Positive for chills, fatigue and fever.  HENT: Positive for sore throat. Negative for ear pain.   Respiratory: Positive for cough.   Cardiovascular: Negative for chest pain.  Gastrointestinal: Positive for abdominal pain and nausea.  Genitourinary: Positive for dysuria and frequency. Negative for pelvic pain and vaginal discharge.       Past Medical History:  Diagnosis Date  . Acute pancreatitis   . Asthma    main dyspnea thought due to deconditioning (10/2013)  . Bipolar I disorder, most recent episode (or current) manic, unspecified    pt denies this  . Bronchitis, chronic obstructive (HCC) 2008 & 2009   FeV1 64% TLC 105% DLCO 55% 2008  -  FeV1 81% FeF 25-75 43% 2009  . COPD (chronic obstructive pulmonary disease) (HCC)    emphysema, not an issue per pulm (10/2013)  . History of pyelonephritis 05/2012  . HTN (hypertension)   . Hx of migraines   . Knee pain, right   . Leukocytosis, unspecified   . Lumbar disc disease with radiculopathy    multilevel spondylitic changes, scoliosis, and anterolisthesis L4 not thought to currently be surg candidate (Dr. Phoebe Perch, [facility]) (Dr. Ollen Bowl, [facility])   . Nicotine addiction   . Nonspecific abnormal electrocardiogram (ECG) (EKG)   . Obesity   . Pure hyperglyceridemia   . Suicide attempt (HCC) 06/2001  . Swelling of limb   . Urge and stress incontinence 07/2012   ([facility])     Social History   Social History  . Marital status: Married    Spouse name: N/A  . Number of children: 3  . Years of education: N/A   Occupational History  . 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  [employer]    Social History Main Topics  . Smoking status: Former Smoker    Packs/day: 0.50    Years: 30.00    Types: Cigarettes    Quit date: 07/17/2015  . Smokeless tobacco: Never Used  . Alcohol use No  . Drug use: No  . Sexual activity: Not on file   Other Topics Concern  . Not on file   Social History Narrative   Lives with husband and son and daughter in Social worker and grandchild   Occupation: Media planner at [employer]   Activity: limited by dyspnea   Diet: good water, fruits/vegetables daily    Past Surgical History:  Procedure Laterality Date  . CHOLECYSTECTOMY  07/1982  . CT MAXILLOFACIAL WO/W CM  06/14/06   Left max mucocele  . CT of chest  06/14/2006   Normal except c/w active inflammation / infection.  Left renal atrophy  .  ERCP / sphincterotomy - stenosis  01/15/06  . lumbar MRI  11/2010   multilevel spondylitic changes with upper lumbar scoliosis and anterolisthesis of L4 on 5 with some L foraminal narrowing esp at L/3 with concavity of scoliosis, some central stenosis and biforaminal narrowing at L4/5 with spondylolisthesis  . [place] - Pneumonia, acute asthma, left maxillary sinusitis  01/11 - 06/18/2006  . Retained stone     ERCP secondary to retained stone  . Right knee surgery  1984,1989,1989,1991,1994   Reconstructions for ACL insuff  . TUBAL LIGATION      Family History  Problem Relation Age of Onset  . Hypertension Mother   . Hyperlipidemia Mother   . Hypertension Father   . Heart  disease Brother        MI in early 47's  . Asthma Father   . Breast cancer Paternal Grandmother     Allergies  Allergen Reactions  . Metolazone     REACTION: metabolic mood swings    Current Outpatient Prescriptions on File Prior to Visit  Medication Sig Dispense Refill  . albuterol (PROVENTIL) (2.5 MG/3ML) 0.083% nebulizer solution INHALE 1 VIAL VIA NEBULIZER EVERY 6 HOURS AS NEEDED FOR WHEEZING/SHORT BREATH 150 mL 3  . BREO ELLIPTA 200-25 MCG/INH AEPB INHALE 1 PUFF INTO THE LUNGS DAILY. 60 each 5  . cetirizine (ZYRTEC) 10 MG tablet Take 10 mg by mouth daily as needed for allergies.    . citalopram (CELEXA) 10 MG tablet Take 1 tablet (10 mg total) by mouth daily. 90 tablet 0  . cyanocobalamin 500 MCG tablet Take 1 tablet (500 mcg total) by mouth daily. Vit B12    . fluticasone (FLONASE) 50 MCG/ACT nasal spray SPRAY 2 SPRAYS INTO EACH NOSTRIL EVERY DAY 48 g 0  . gabapentin (NEURONTIN) 300 MG capsule Take 1 capsule (300 mg total) by mouth 2 (two) times daily. 60 capsule 6  . guaiFENesin (MUCINEX) 600 MG 12 hr tablet Take 1 tablet (600 mg total) by mouth 2 (two) times daily. 30 tablet 0  . hydrochlorothiazide (HYDRODIURIL) 25 MG tablet TAKE 1 TABLET BY MOUTH ONCE DAILY 90 tablet 1  . HYDROcodone-homatropine (HYCODAN) 5-1.5 MG/5ML syrup Take 5 mLs by mouth every 8 (eight) hours as needed for cough. 120 mL 0  . LORazepam (ATIVAN) 0.5 MG tablet TAKE 1/2 TO 1 TABLET TWICE A DAY AS NEEDED 20 tablet 0  . metFORMIN (GLUCOPHAGE) 500 MG tablet TAKE 1 TABLET BY MOUTH AT BEDTIME 30 tablet 0  . nystatin (MYCOSTATIN) 100000 UNIT/ML suspension TAKE 1 TEASPOONFUL BY MOUTH 3 TIMES A DAY FOR 10 DAYS 150 mL 0  . predniSONE (DELTASONE) 20 MG tablet Take 2 tablets once daily with food 14 tablet 0  . PROAIR HFA 108 (90 Base) MCG/ACT inhaler INHALE 2 PUFFS INTO THE LUNGS EVERY 6 (SIX) HOURS AS NEEDED FOR WHEEZING OR SHORTNESS OF BREATH. 8.5 Inhaler 2  . PROAIR RESPICLICK 108 (90 Base) MCG/ACT AEPB INHALE 2  PUFFS INTO THE LUNGS EVERY 6 HOURS AS NEEDED. DYSPNEA/WHEEZE 2 each 0  . SPIRIVA HANDIHALER 18 MCG inhalation capsule PLACE 1 CAPSULE INTO INHALER AND INHALE DAILY. 30 capsule 11   No current facility-administered medications on file prior to visit.     BP 122/72   Pulse 88   Temp 99.1 F (37.3 C) (Oral)   Ht 5\' 3"  (1.6 m)   Wt 262 lb 6.4 oz (119 kg)   LMP 03/08/2013   SpO2 97%   BMI 46.48 kg/m  Objective:   Physical Exam  Constitutional: She appears well-nourished.  HENT:  Right Ear: Tympanic membrane and ear canal normal.  Left Ear: Tympanic membrane and ear canal normal.  Nose: Right sinus exhibits no maxillary sinus tenderness and no frontal sinus tenderness. Left sinus exhibits no maxillary sinus tenderness and no frontal sinus tenderness.  Mouth/Throat: Posterior oropharyngeal edema and posterior oropharyngeal erythema present. No oropharyngeal exudate.  Eyes: Conjunctivae are normal.  Neck: Neck supple.  Cardiovascular: Normal rate and regular rhythm.   Pulmonary/Chest: Effort normal. She has no wheezes.  Mild rhonchi throughout fields   Abdominal: Normal appearance. There is no tenderness. There is no CVA tenderness.  Lymphadenopathy:    She has no cervical adenopathy.  Skin: Skin is warm and dry.          Assessment & Plan:  Fevers:  Also with urinary symptoms, mild cough, and body aches. Exam today doesn't suggest COPD/respiratory bacterial infection.  UA today: 1+ leuks, negative nitrites, trace blood. Culture sent. Rapid strep: Positive Given presentation, high fevers, and HPI, will treat for presumed UTI also for positive strep. Rx for Augmentin course sent to pharmacy. Push fluids, rest, continue Advil, alternate with tylenol. Follow up PRN.  Brenda [name],Brenda Below Kendal, NP

## 2017-01-13 LAB — URINE CULTURE

## 2017-01-20 ENCOUNTER — Other Ambulatory Visit: Payer: Self-pay | Admitting: Family Medicine

## 2017-01-21 ENCOUNTER — Other Ambulatory Visit: Payer: Self-pay | Admitting: Family Medicine

## 2017-01-29 ENCOUNTER — Other Ambulatory Visit: Payer: Self-pay | Admitting: Family Medicine

## 2017-01-29 NOTE — Telephone Encounter (Signed)
Rx called in to requested pharmacy 

## 2017-01-29 NOTE — Telephone Encounter (Signed)
plz phone in. Upcoming CPE at end of month.

## 2017-02-07 ENCOUNTER — Other Ambulatory Visit: Payer: Self-pay | Admitting: Family Medicine

## 2017-02-24 ENCOUNTER — Encounter: Payer: Self-pay | Admitting: Family Medicine

## 2017-02-24 ENCOUNTER — Ambulatory Visit (INDEPENDENT_AMBULATORY_CARE_PROVIDER_SITE_OTHER): Payer: BLUE CROSS/BLUE SHIELD | Admitting: Family Medicine

## 2017-02-24 VITALS — BP 120/74 | HR 82 | Temp 98.2°F | Ht 61.0 in | Wt 266.5 lb

## 2017-02-24 DIAGNOSIS — Z23 Encounter for immunization: Secondary | ICD-10-CM

## 2017-02-24 DIAGNOSIS — F17200 Nicotine dependence, unspecified, uncomplicated: Secondary | ICD-10-CM | POA: Diagnosis not present

## 2017-02-24 DIAGNOSIS — Z0000 Encounter for general adult medical examination without abnormal findings: Secondary | ICD-10-CM | POA: Diagnosis not present

## 2017-02-24 DIAGNOSIS — Z1211 Encounter for screening for malignant neoplasm of colon: Secondary | ICD-10-CM

## 2017-02-24 DIAGNOSIS — I10 Essential (primary) hypertension: Secondary | ICD-10-CM

## 2017-02-24 DIAGNOSIS — J4530 Mild persistent asthma, uncomplicated: Secondary | ICD-10-CM

## 2017-02-24 DIAGNOSIS — E1142 Type 2 diabetes mellitus with diabetic polyneuropathy: Secondary | ICD-10-CM

## 2017-02-24 DIAGNOSIS — E781 Pure hyperglyceridemia: Secondary | ICD-10-CM | POA: Diagnosis not present

## 2017-02-24 DIAGNOSIS — F4320 Adjustment disorder, unspecified: Secondary | ICD-10-CM | POA: Diagnosis not present

## 2017-02-24 DIAGNOSIS — F4321 Adjustment disorder with depressed mood: Secondary | ICD-10-CM | POA: Insufficient documentation

## 2017-02-24 DIAGNOSIS — F4322 Adjustment disorder with anxiety: Secondary | ICD-10-CM

## 2017-02-24 MED ORDER — HYDROCHLOROTHIAZIDE 25 MG PO TABS: 25.0000 mg | ORAL_TABLET | Freq: Every day | ORAL | 3 refills | Status: DC

## 2017-02-24 MED ORDER — LORAZEPAM 0.5 MG PO TABS: ORAL_TABLET | ORAL | 0 refills | Status: DC

## 2017-02-24 MED ORDER — GABAPENTIN 300 MG PO CAPS: 300.0000 mg | ORAL_CAPSULE | Freq: Two times a day (BID) | ORAL | 3 refills | Status: DC

## 2017-02-24 MED ORDER — CITALOPRAM HYDROBROMIDE 20 MG PO TABS: 20.0000 mg | ORAL_TABLET | Freq: Every day | ORAL | 1 refills | Status: DC

## 2017-02-24 NOTE — Assessment & Plan Note (Signed)
Up to 1/2 ppd. Not at a place where she could consider quitting. Will continue to assess for cessation readiness.

## 2017-02-24 NOTE — Assessment & Plan Note (Signed)
Lost husband 11/2016. Support provided. She is struggling. Will increase celexa to  daily, continue lorazepam  PRN, discussed hopeful for titration if doing better on higher celexa.

## 2017-02-24 NOTE — Patient Instructions (Addendum)
Tdap today Labs today.  Pass by lab to pick up stool kit. Call the breast center to schedule mammogram.  Increase celexa to '20mg'$  daily - new dose at pharmacy. Continue lorazepam 1 tablet daily, if celexa doing well, try 1/2 tablet every once in a while.  Flu shot at work. Return as needed or in 6 months for follow up visit  Health Maintenance, Female Adopting a healthy lifestyle and getting preventive care can go a long way to promote health and wellness. Talk with your health care provider about what schedule of regular examinations is right for you. This is a good chance for you to check in with your provider about disease prevention and staying healthy. In between checkups, there are plenty of things you can do on your own. Experts have done a lot of research about which lifestyle changes and preventive measures are most likely to keep you healthy. Ask your health care provider for more information. Weight and diet Eat a healthy diet  Be sure to include plenty of vegetables, fruits, low-fat dairy products, and lean protein.  Do not eat a lot of foods high in solid fats, added sugars, or salt.  Get regular exercise. This is one of the most important things you can do for your health. ? Most adults should exercise for at least 150 minutes each week. The exercise should increase your heart rate and make you sweat (moderate-intensity exercise). ? Most adults should also do strengthening exercises at least twice a week. This is in addition to the moderate-intensity exercise.  Maintain a healthy weight  Body mass index (BMI) is a measurement that can be used to identify possible weight problems. It estimates body fat based on height and weight. Your health care provider can help determine your BMI and help you achieve or maintain a healthy weight.  For females 5 years of age and older: ? A BMI below 18.5 is considered underweight. ? A BMI of 18.5 to 24.9 is normal. ? A BMI of 25 to 29.9 is  considered overweight. ? A BMI of 30 and above is considered obese.  Watch levels of cholesterol and blood lipids  You should start having your blood tested for lipids and cholesterol at 58 years of age, then have this test every 5 years.  You may need to have your cholesterol levels checked more often if: ? Your lipid or cholesterol levels are high. ? You are older than 58 years of age. ? You are at high risk for heart disease.  Cancer screening Lung Cancer  Lung cancer screening is recommended for adults 42-30 years old who are at high risk for lung cancer because of a history of smoking.  A yearly low-dose CT scan of the lungs is recommended for people who: ? Currently smoke. ? Have quit within the past 15 years. ? Have at least a 30-pack-year history of smoking. A pack year is smoking an average of one pack of cigarettes a day for 1 year.  Yearly screening should continue until it has been 15 years since you quit.  Yearly screening should stop if you develop a health problem that would prevent you from having lung cancer treatment.  Breast Cancer  Practice breast self-awareness. This means understanding how your breasts normally appear and feel.  It also means doing regular breast self-exams. Let your health care provider know about any changes, no matter how small.  If you are in your 20s or 30s, you should have a clinical  breast exam (CBE) by a health care provider every 1-3 years as part of a regular health exam.  If you are 82 or older, have a CBE every year. Also consider having a breast X-ray (mammogram) every year.  If you have a family history of breast cancer, talk to your health care provider about genetic screening.  If you are at high risk for breast cancer, talk to your health care provider about having an MRI and a mammogram every year.  Breast cancer gene (BRCA) assessment is recommended for women who have family members with BRCA-related cancers.  BRCA-related cancers include: ? Breast. ? Ovarian. ? Tubal. ? Peritoneal cancers.  Results of the assessment will determine the need for genetic counseling and BRCA1 and BRCA2 testing.  Cervical Cancer Your health care provider may recommend that you be screened regularly for cancer of the pelvic organs (ovaries, uterus, and vagina). This screening involves a pelvic examination, including checking for microscopic changes to the surface of your cervix (Pap test). You may be encouraged to have this screening done every 3 years, beginning at age 47.  For women ages 43-65, health care providers may recommend pelvic exams and Pap testing every 3 years, or they may recommend the Pap and pelvic exam, combined with testing for human papilloma virus (HPV), every 5 years. Some types of HPV increase your risk of cervical cancer. Testing for HPV may also be done on women of any age with unclear Pap test results.  Other health care providers may not recommend any screening for nonpregnant women who are considered low risk for pelvic cancer and who do not have symptoms. Ask your health care provider if a screening pelvic exam is right for you.  If you have had past treatment for cervical cancer or a condition that could lead to cancer, you need Pap tests and screening for cancer for at least 20 years after your treatment. If Pap tests have been discontinued, your risk factors (such as having a new sexual partner) need to be reassessed to determine if screening should resume. Some women have medical problems that increase the chance of getting cervical cancer. In these cases, your health care provider may recommend more frequent screening and Pap tests.  Colorectal Cancer  This type of cancer can be detected and often prevented.  Routine colorectal cancer screening usually begins at 58 years of age and continues through 58 years of age.  Your health care provider may recommend screening at an earlier age if  you have risk factors for colon cancer.  Your health care provider may also recommend using home test kits to check for hidden blood in the stool.  A small camera at the end of a tube can be used to examine your colon directly (sigmoidoscopy or colonoscopy). This is done to check for the earliest forms of colorectal cancer.  Routine screening usually begins at age 62.  Direct examination of the colon should be repeated every 5-10 years through 58 years of age. However, you may need to be screened more often if early forms of precancerous polyps or small growths are found.  Skin Cancer  Check your skin from head to toe regularly.  Tell your health care provider about any new moles or changes in moles, especially if there is a change in a mole's shape or color.  Also tell your health care provider if you have a mole that is larger than the size of a pencil eraser.  Always use sunscreen. Apply  sunscreen liberally and repeatedly throughout the day.  Protect yourself by wearing long sleeves, pants, a wide-brimmed hat, and sunglasses whenever you are outside.  Heart disease, diabetes, and high blood pressure  High blood pressure causes heart disease and increases the risk of stroke. High blood pressure is more likely to develop in: ? People who have blood pressure in the high end of the normal range (130-139/85-89 mm Hg). ? People who are overweight or obese. ? People who are African American.  If you are 43-63 years of age, have your blood pressure checked every 3-5 years. If you are 19 years of age or older, have your blood pressure checked every year. You should have your blood pressure measured twice-once when you are at a hospital or clinic, and once when you are not at a hospital or clinic. Record the average of the two measurements. To check your blood pressure when you are not at a hospital or clinic, you can use: ? An automated blood pressure machine at a pharmacy. ? A home blood  pressure monitor.  If you are between 76 years and 62 years old, ask your health care provider if you should take aspirin to prevent strokes.  Have regular diabetes screenings. This involves taking a blood sample to check your fasting blood sugar level. ? If you are at a normal weight and have a low risk for diabetes, have this test once every three years after 58 years of age. ? If you are overweight and have a high risk for diabetes, consider being tested at a younger age or more often. Preventing infection Hepatitis B  If you have a higher risk for hepatitis B, you should be screened for this virus. You are considered at high risk for hepatitis B if: ? You were born in a country where hepatitis B is common. Ask your health care provider which countries are considered high risk. ? Your parents were born in a high-risk country, and you have not been immunized against hepatitis B (hepatitis B vaccine). ? You have HIV or AIDS. ? You use needles to inject street drugs. ? You live with someone who has hepatitis B. ? You have had sex with someone who has hepatitis B. ? You get hemodialysis treatment. ? You take certain medicines for conditions, including cancer, organ transplantation, and autoimmune conditions.  Hepatitis C  Blood testing is recommended for: ? Everyone born from [birth year] through 1967. ? Anyone with known risk factors for hepatitis C.  Sexually transmitted infections (STIs)  You should be screened for sexually transmitted infections (STIs) including gonorrhea and chlamydia if: ? You are sexually active and are younger than 58 years of age. ? You are older than 58 years of age and your health care provider tells you that you are at risk for this type of infection. ? Your sexual activity has changed since you were last screened and you are at an increased risk for chlamydia or gonorrhea. Ask your health care provider if you are at risk.  If you do not have HIV, but are at risk,  it may be recommended that you take a prescription medicine daily to prevent HIV infection. This is called pre-exposure prophylaxis (PrEP). You are considered at risk if: ? You are sexually active and do not regularly use condoms or know the HIV status of your partner(s). ? You take drugs by injection. ? You are sexually active with a partner who has HIV.  Talk with your health care provider  about whether you are at high risk of being infected with HIV. If you choose to begin PrEP, you should first be tested for HIV. You should then be tested every 3 months for as long as you are taking PrEP. Pregnancy  If you are premenopausal and you may become pregnant, ask your health care provider about preconception counseling.  If you may become pregnant, take 400 to 800 micrograms (mcg) of folic acid every day.  If you want to prevent pregnancy, talk to your health care provider about birth control (contraception). Osteoporosis and menopause  Osteoporosis is a disease in which the bones lose minerals and strength with aging. This can result in serious bone fractures. Your risk for osteoporosis can be identified using a bone density scan.  If you are 38 years of age or older, or if you are at risk for osteoporosis and fractures, ask your health care provider if you should be screened.  Ask your health care provider whether you should take a calcium or vitamin D supplement to lower your risk for osteoporosis.  Menopause may have certain physical symptoms and risks.  Hormone replacement therapy may reduce some of these symptoms and risks. Talk to your health care provider about whether hormone replacement therapy is right for you. Follow these instructions at home:  Schedule regular health, dental, and eye exams.  Stay current with your immunizations.  Do not use any tobacco products including cigarettes, chewing tobacco, or electronic cigarettes.  If you are pregnant, do not drink  alcohol.  If you are breastfeeding, limit how much and how often you drink alcohol.  Limit alcohol intake to no more than 1 drink per day for nonpregnant women. One drink equals 12 ounces of beer, 5 ounces of wine, or 1 ounces of hard liquor.  Do not use street drugs.  Do not share needles.  Ask your health care provider for help if you need support or information about quitting drugs.  Tell your health care provider if you often feel depressed.  Tell your health care provider if you have ever been abused or do not feel safe at home. This information is not intended to replace advice given to you by your health care provider. Make sure you discuss any questions you have with your health care provider. Document Released: 12/01/2010 Document Revised: 10/24/2015 Document Reviewed: 02/19/2015 Elsevier Interactive Patient Education  [employer].

## 2017-02-24 NOTE — Assessment & Plan Note (Signed)
Did not address today.

## 2017-02-24 NOTE — Assessment & Plan Note (Addendum)
Continue metformin. Update labs.  Gabapentin was started 07/2016 for worsening neuropathy.

## 2017-02-24 NOTE — Progress Notes (Signed)
BP 120/74 (BP Location: Left Arm, Patient Position: Sitting, Cuff Size: Large)   Pulse 82   Temp 98.2 F (36.8 C) (Oral)   Ht  (1.549 m)   Wt 266 lb 8 oz (120.9 kg)   LMP 03/08/2013   SpO2 93%   BMI 50.35 kg/m    CC: CPE Subjective:    Patient ID: Brenda [name], female    DOB: [birth year], 56 y.o.   MRN: [id number]  HPI: Brenda [name] is a 56 y.o. female presenting on 02/24/2017 for Annual Exam (Needs labs)   Husband Jillyn Hidden passed away 2017-01-03. Hospice involved. Involved in grief counseling through hospice. Taking lorazepam before going to work in the morning. Takes one tablet every morning.   Preventative: Colon cancer screening - discussed, normal iFOB 2017. Requests repeat.  Mammogram - 08/2014 WNL. Due for rpt Well woman exam - normal 08/2014, 09/2015. Will skip this year. Consider next year. H/o BTL.  Flu yearly Pneumovax 2003, 07/2015 Tdap today.  Advanced directive - HCPOA is husband. Has advanced directive at home. Will bring me copy Seat belt use discussed Sunscreen use discussed, no changing moles on skin. Smoking 1/2 ppd Alcohol - none  Lives with husband and son and daughter in law and grandchild Occupation: Media planner at [employer] Activity: limited by dyspnea Diet: good water, fruits/vegetables daily  Relevant past medical, surgical, family and social history reviewed and updated as indicated. Interim medical history since our last visit reviewed. Allergies and medications reviewed and updated. Outpatient Medications Prior to Visit  Medication Sig Dispense Refill  . albuterol (PROVENTIL) (2.5 MG/3ML) 0.083% nebulizer solution INHALE 1 VIAL VIA NEBULIZER EVERY 6 HOURS AS NEEDED FOR WHEEZING/SHORT BREATH 150 mL 3  . BREO ELLIPTA 200-25 MCG/INH AEPB INHALE 1 PUFF INTO THE LUNGS DAILY. 60 each 5  . cetirizine (ZYRTEC) 10 MG tablet Take 10 mg by mouth daily as needed for allergies.    . cyanocobalamin 500 MCG tablet Take 1 tablet (500 mcg  total) by mouth daily. Vit B12    . fluticasone (FLONASE) 50 MCG/ACT nasal spray SPRAY 2 SPRAYS INTO EACH NOSTRIL EVERY DAY 48 g 0  . guaiFENesin (MUCINEX) 600 MG 12 hr tablet Take 1 tablet (600 mg total) by mouth 2 (two) times daily. 30 tablet 0  . HYDROcodone-homatropine (HYCODAN) 5-1.5 MG/5ML syrup Take 5 mLs by mouth every 8 (eight) hours as needed for cough. 120 mL 0  . metFORMIN (GLUCOPHAGE) 500 MG tablet TAKE 1 TABLET BY MOUTH EVERYDAY AT BEDTIME 90 tablet 1  . nystatin (MYCOSTATIN) 100000 UNIT/ML suspension TAKE 1 TEASPOONFUL BY MOUTH 3 TIMES A DAY FOR 10 DAYS 150 mL 0  . PROAIR HFA 108 (90 Base) MCG/ACT inhaler INHALE 2 PUFFS INTO THE LUNGS EVERY 6 (SIX) HOURS AS NEEDED FOR WHEEZING OR SHORTNESS OF BREATH. 8.5 Inhaler 2  . PROAIR RESPICLICK 108 (90 Base) MCG/ACT AEPB INHALE 2 PUFFS INTO THE LUNGS EVERY 6 HOURS AS NEEDED. DYSPNEA/WHEEZE 2 each 0  . SPIRIVA HANDIHALER 18 MCG inhalation capsule PLACE 1 CAPSULE INTO INHALER AND INHALE DAILY. 30 capsule 11  . amoxicillin-clavulanate (AUGMENTIN) 875-125 MG tablet Take 1 tablet by mouth 2 (two) times daily. 14 tablet 0  . citalopram (CELEXA) 10 MG tablet Take 1 tablet (10 mg total) by mouth daily. MUST SCHEDULE ANNUAL EXAM FOR REFILLS 90 tablet 0  . gabapentin (NEURONTIN) 300 MG capsule TAKE 1 CAPSULE (300 MG TOTAL) BY MOUTH 2 (TWO) TIMES DAILY. 60 capsule 0  .  hydrochlorothiazide (HYDRODIURIL) 25 MG tablet TAKE 1 TABLET BY MOUTH ONCE DAILY 90 tablet 1  . LORazepam (ATIVAN) 0.5 MG tablet TAKE 1/2 TO 1 TABLET TWICE A DAY AS NEEDED 20 tablet 0  . predniSONE (DELTASONE) 20 MG tablet Take 2 tablets once daily with food 14 tablet 0   No facility-administered medications prior to visit.      Per HPI unless specifically indicated in ROS section below Review of Systems  Constitutional: Positive for appetite change. Negative for activity change, chills, fatigue, fever and unexpected weight change.  HENT: Negative for hearing loss.   Eyes: Negative  for visual disturbance.  Respiratory: Negative for cough, chest tightness, shortness of breath and wheezing.   Cardiovascular: Negative for chest pain, palpitations and leg swelling.       Leg cramping  Gastrointestinal: Positive for diarrhea (on weekends). Negative for abdominal distention, abdominal pain, blood in stool, constipation, nausea and vomiting.  Genitourinary: Negative for difficulty urinating and hematuria.  Musculoskeletal: Negative for arthralgias, myalgias and neck pain.  Skin: Negative for rash.  Neurological: Negative for dizziness, seizures, syncope and headaches.  Hematological: Negative for adenopathy. Does not bruise/bleed easily.  Psychiatric/Behavioral: Positive for dysphoric mood. The patient is nervous/anxious.        Grieving       Objective:    BP 120/74 (BP Location: Left Arm, Patient Position: Sitting, Cuff Size: Large)   Pulse 82   Temp 98.2 F (36.8 C) (Oral)   Ht  (1.549 m)   Wt 266 lb 8 oz (120.9 kg)   LMP 03/08/2013   SpO2 93%   BMI 50.35 kg/m   Wt Readings from Last 3 Encounters:  02/24/17 266 lb 8 oz (120.9 kg)  01/12/17 262 lb 6.4 oz (119 kg)  11/16/16 267 lb (121.1 kg)    Physical Exam  Constitutional: She is oriented to person, place, and time. She appears well-developed and well-nourished. No distress.  HENT:  Head: Normocephalic and atraumatic.  Right Ear: Hearing, tympanic membrane, external ear and ear canal normal.  Left Ear: Hearing, tympanic membrane, external ear and ear canal normal.  Nose: Nose normal.  Mouth/Throat: Uvula is midline, oropharynx is clear and moist and mucous membranes are normal. No oropharyngeal exudate, posterior oropharyngeal edema or posterior oropharyngeal erythema.  Eyes: Pupils are equal, round, and reactive to light. Conjunctivae and EOM are normal. No scleral icterus.  Neck: Normal range of motion. Neck supple. No thyromegaly present.  Cardiovascular: Normal rate, regular rhythm, normal heart  sounds and intact distal pulses.   No murmur heard. Pulses:      Radial pulses are 2+ on the right side, and 2+ on the left side.  Pulmonary/Chest: Effort normal and breath sounds normal. No respiratory distress. She has no wheezes. She has no rales.  Abdominal: Soft. Bowel sounds are normal. She exhibits no distension and no mass. There is no tenderness. There is no rebound and no guarding.  Musculoskeletal: Normal range of motion. She exhibits no edema.  Lymphadenopathy:    She has no cervical adenopathy.  Neurological: She is alert and oriented to person, place, and time.  CN grossly intact, station and gait intact  Skin: Skin is warm and dry. No rash noted.  Psychiatric: She has a normal mood and affect. Her behavior is normal. Judgment and thought content normal.  Nursing note and vitals reviewed.  Results for orders placed or performed in visit on 01/12/17  Urine Culture  Result Value Ref Range   Organism  ID, Bacteria      Three or more organisms present,each greater than 10,000 CFU/mL.These organisms,commonly found on external and internal genitalia,are considered to be colonizers.No further testing performed.   POCT rapid strep A  Result Value Ref Range   Rapid Strep A Screen Positive (A) Negative  POCT Urinalysis Dipstick (Automated)  Result Value Ref Range   Color, UA yellow    Clarity, UA cloudy    Glucose, UA negative    Bilirubin, UA negative    Ketones, UA negative    Spec Grav, UA >=1.030 (A) 1.010 - 1.025   Blood, UA +-    pH, UA 6.0 5.0 - 8.0   Protein, UA +-    Urobilinogen, UA 0.2 0.2 or 1.0 E.U./dL   Nitrite, UA negative    Leukocytes, UA Small (1+) (A) Negative      Assessment & Plan:   Problem List Items Addressed This Visit    Adjustment disorder with anxiety    Acute grief complicates this. See above. Support provided. Increase celexa to  daily, continue PRN lorazepam.  She is already involved in grief counseling through hospice.        Asthmatic bronchitis    Endorses stable period with her breathing.       Grief    Lost husband 11/2016. Support provided. She is struggling. Will increase celexa to  daily, continue lorazepam  PRN, discussed hopeful for titration if doing better on higher celexa.       Health maintenance examination - Primary    Preventative protocols reviewed and updated unless pt declined. Discussed healthy diet and lifestyle.       HYPERTENSION, BENIGN ESSENTIAL    Chronic, stable. Continue current regimen. Update labs today.       Relevant Medications   hydrochlorothiazide (HYDRODIURIL) 25 MG tablet   HYPERTRIGLYCERIDEMIA    Update labs today. Not on statin.       Relevant Medications   hydrochlorothiazide (HYDRODIURIL) 25 MG tablet   Other Relevant Orders   Lipid panel   Comprehensive metabolic panel   Obesity, morbid, BMI 50 or higher (HCC)    Did not address today.       Smoker    Up to 1/2 ppd. Not at a place where she could consider quitting. Will continue to assess for cessation readiness.       Type 2 diabetes, controlled, with peripheral neuropathy (HCC)    Continue metformin. Update labs.  Gabapentin was started 07/2016 for worsening neuropathy.       Relevant Medications   citalopram (CELEXA) 20 MG tablet   gabapentin (NEURONTIN) 300 MG capsule   LORazepam (ATIVAN) 0.5 MG tablet   Other Relevant Orders   Hemoglobin A1c   Microalbumin / creatinine urine ratio    Other Visit Diagnoses    Special screening for malignant neoplasms, colon       Relevant Orders   Fecal occult blood, imunochemical       Follow up plan: Return in about 6 months (around 08/24/2017) for follow up visit.  [name], MD

## 2017-02-24 NOTE — Assessment & Plan Note (Addendum)
Acute grief complicates this. See above. Support provided. Increase celexa to  daily, continue PRN lorazepam.  She is already involved in grief counseling through hospice.

## 2017-02-24 NOTE — Assessment & Plan Note (Signed)
Chronic, stable. Continue current regimen. Update labs today.  

## 2017-02-24 NOTE — Assessment & Plan Note (Signed)
Preventative protocols reviewed and updated unless pt declined. Discussed healthy diet and lifestyle.  

## 2017-02-24 NOTE — Assessment & Plan Note (Signed)
Endorses stable period with her breathing.

## 2017-02-24 NOTE — Assessment & Plan Note (Signed)
Update labs today. Not on statin.

## 2017-02-25 LAB — COMPREHENSIVE METABOLIC PANEL
ALT: 16 U/L (ref 0–35)
AST: 17 U/L (ref 0–37)
Albumin: 4.2 g/dL (ref 3.5–5.2)
Alkaline Phosphatase: 102 U/L (ref 39–117)
BUN: 14 mg/dL (ref 6–23)
CO2: 30 mEq/L (ref 19–32)
Calcium: 10.2 mg/dL (ref 8.4–10.5)
Chloride: 98 mEq/L (ref 96–112)
Creatinine, Ser: 0.95 mg/dL (ref 0.40–1.20)
GFR: 64.08 mL/min (ref >60.00)
Glucose, Bld: 102 mg/dL — ABNORMAL HIGH (ref 70–99)
Potassium: 4.5 mEq/L (ref 3.5–5.1)
Sodium: 136 mEq/L (ref 135–145)
Total Bilirubin: 0.3 mg/dL (ref 0.2–1.2)
Total Protein: 7.9 g/dL (ref 6.0–8.3)

## 2017-02-25 LAB — MICROALBUMIN / CREATININE URINE RATIO
Creatinine,U: 66.1 mg/dL
Microalb Creat Ratio: 1.1 mg/g (ref 0.0–30.0)
Microalb, Ur: <0.7 mg/dL (ref 0.0–1.9)

## 2017-02-25 LAB — LIPID PANEL
Cholesterol: 192 mg/dL (ref 0–200)
HDL: 46.4 mg/dL (ref >39.00)
LDL Cholesterol: 123 mg/dL — ABNORMAL HIGH (ref 0–99)
NonHDL: 145.73
Total CHOL/HDL Ratio: 4
Triglycerides: 115 mg/dL (ref 0.0–149.0)
VLDL: 23 mg/dL (ref 0.0–40.0)

## 2017-02-25 LAB — HEMOGLOBIN A1C: Hgb A1c MFr Bld: 6.5 % (ref 4.6–6.5)

## 2017-02-25 NOTE — Addendum Note (Signed)
Addended by: Nanci Pina on: 02/25/2017 03:35 PM   Modules accepted: Orders

## 2017-03-01 ENCOUNTER — Other Ambulatory Visit: Payer: Self-pay | Admitting: Family Medicine

## 2017-03-01 DIAGNOSIS — Z1231 Encounter for screening mammogram for malignant neoplasm of breast: Secondary | ICD-10-CM

## 2017-03-04 ENCOUNTER — Other Ambulatory Visit: Payer: BLUE CROSS/BLUE SHIELD

## 2017-03-04 DIAGNOSIS — Z1211 Encounter for screening for malignant neoplasm of colon: Secondary | ICD-10-CM

## 2017-03-04 LAB — FECAL OCCULT BLOOD, GUAIAC: Fecal Occult Blood: NEGATIVE

## 2017-03-04 LAB — FECAL OCCULT BLOOD, IMMUNOCHEMICAL: Fecal Occult Bld: NEGATIVE

## 2017-03-08 ENCOUNTER — Encounter: Payer: Self-pay | Admitting: Family Medicine

## 2017-03-15 ENCOUNTER — Ambulatory Visit
Admission: RE | Admit: 2017-03-15 | Discharge: 2017-03-15 | Disposition: A | Discharge status: Home / Self Care | Payer: BLUE CROSS/BLUE SHIELD | Source: Ambulatory Visit | Attending: Family Medicine | Admitting: Family Medicine

## 2017-03-15 DIAGNOSIS — Z1231 Encounter for screening mammogram for malignant neoplasm of breast: Secondary | ICD-10-CM

## 2017-03-16 LAB — HM MAMMOGRAPHY

## 2017-03-17 ENCOUNTER — Encounter: Payer: Self-pay | Admitting: Family Medicine

## 2017-03-21 ENCOUNTER — Other Ambulatory Visit: Payer: Self-pay | Admitting: Family Medicine

## 2017-03-22 NOTE — Telephone Encounter (Signed)
Last filled:  02/25/17, #30 Last OV (CPE):  02/24/17 Next OV:  08/25/16

## 2017-03-23 NOTE — Telephone Encounter (Signed)
plz phone in. 

## 2017-03-24 NOTE — Telephone Encounter (Signed)
Refill left on vm at pharmacy.  

## 2017-03-25 ENCOUNTER — Encounter: Payer: Self-pay | Admitting: Family Medicine

## 2017-04-03 ENCOUNTER — Other Ambulatory Visit: Payer: Self-pay | Admitting: Internal Medicine

## 2017-04-17 ENCOUNTER — Other Ambulatory Visit: Payer: Self-pay | Admitting: Family Medicine

## 2017-04-18 MED ORDER — CITALOPRAM HYDROBROMIDE 20 MG PO TABS: 20.0000 mg | ORAL_TABLET | Freq: Every day | ORAL | 1 refills | Status: DC

## 2017-05-03 ENCOUNTER — Other Ambulatory Visit: Payer: Self-pay | Admitting: Family Medicine

## 2017-05-04 NOTE — Telephone Encounter (Signed)
Sent electronically 

## 2017-05-27 ENCOUNTER — Other Ambulatory Visit: Payer: Self-pay | Admitting: Family Medicine

## 2017-05-28 ENCOUNTER — Other Ambulatory Visit: Payer: Self-pay | Admitting: Family Medicine

## 2017-05-28 NOTE — Telephone Encounter (Signed)
Last filled:  05/04/17, #30 Last OV (CPE):  02/24/17 Next OV:  08/25/17

## 2017-05-30 NOTE — Telephone Encounter (Signed)
Sent electronically 

## 2017-06-13 ENCOUNTER — Other Ambulatory Visit: Payer: Self-pay | Admitting: Family Medicine

## 2017-06-27 ENCOUNTER — Other Ambulatory Visit: Payer: Self-pay | Admitting: Family Medicine

## 2017-06-29 ENCOUNTER — Encounter: Payer: Self-pay | Admitting: Family Medicine

## 2017-06-29 ENCOUNTER — Ambulatory Visit: Payer: BLUE CROSS/BLUE SHIELD | Admitting: Family Medicine

## 2017-06-29 VITALS — BP 134/72 | HR 83 | Temp 98.1°F | Ht 61.0 in | Wt 272.0 lb

## 2017-06-29 DIAGNOSIS — R059 Cough, unspecified: Secondary | ICD-10-CM

## 2017-06-29 DIAGNOSIS — R05 Cough: Secondary | ICD-10-CM | POA: Diagnosis not present

## 2017-06-29 DIAGNOSIS — R062 Wheezing: Secondary | ICD-10-CM | POA: Diagnosis not present

## 2017-06-29 DIAGNOSIS — J441 Chronic obstructive pulmonary disease with (acute) exacerbation: Secondary | ICD-10-CM

## 2017-06-29 LAB — POC INFLUENZA A&B (BINAX/QUICKVUE)
Influenza A, POC: NEGATIVE
Influenza B, POC: NEGATIVE

## 2017-06-29 MED ORDER — PREDNISONE 10 MG PO TABS: ORAL_TABLET | ORAL | 0 refills | Status: DC

## 2017-06-29 MED ORDER — ALBUTEROL SULFATE (2.5 MG/3ML) 0.083% IN NEBU
2.5000 mg | INHALATION_SOLUTION | Freq: Once | RESPIRATORY_TRACT | Status: AC
  Administered 2017-06-29: 2.5 mg via RESPIRATORY_TRACT

## 2017-06-29 MED ORDER — METHYLPREDNISOLONE ACETATE 40 MG/ML IJ SUSP
80.0000 mg | Freq: Once | INTRAMUSCULAR | Status: AC
  Administered 2017-06-29: 80 mg via INTRAMUSCULAR

## 2017-06-29 MED ORDER — BENZONATATE 200 MG PO CAPS: 200.0000 mg | ORAL_CAPSULE | Freq: Three times a day (TID) | ORAL | 1 refills | Status: DC | PRN

## 2017-06-29 MED ORDER — PROMETHAZINE-DM 6.25-15 MG/5ML PO SYRP: 5.0000 mL | ORAL_SOLUTION | Freq: Four times a day (QID) | ORAL | 0 refills | Status: DC | PRN

## 2017-06-29 MED ORDER — DOXYCYCLINE HYCLATE 100 MG PO TABS: 100.0000 mg | ORAL_TABLET | Freq: Two times a day (BID) | ORAL | 0 refills | Status: DC

## 2017-06-29 NOTE — Patient Instructions (Addendum)
Lots of rest and fluids  Continue your breathing treatments  Depo medrol injection today (steroid)  Take the prednisone as directed starting tomorrow   If you become more short of breath (that a breathing treatment does not help)- please go to the ER  Flu swab is negative today   Take the doxycycline as directed   Follow up in 2 days or earlier if needed

## 2017-06-29 NOTE — Assessment & Plan Note (Addendum)
With wheeze/sob and uri symptoms  Improved pulse ox to 95% on RA after one albuterol NMT Neg flu swab tx with 80 mg depo medrol IM Prednisone 40 mg taper Tessalon and prometh-DM for cough  Pt has albuterol nmt at home to use every 4 hours  Rest/supp care  Cover with doxycycline Rev red flags for ER visit- sob/inc wheeze  F/u 2 d with pcp for a re check

## 2017-06-29 NOTE — Progress Notes (Signed)
Subjective:    Patient ID: Brenda [name], female    DOB: 04-20-1961, 57 y.o.   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)   Throat is sore - not severe  Ears - feel stopped up  Nasal congestion/ runny nose   (zyrtec and flonase)  Wheezing worsened yesterday  Doing albuterol neb every 4 hours   Temp: 98.1 F (36.7 C)  Pulse Rate: 83  Pulse ox room air 89% with wheezing on arrival  After one NMT albuterol- 95% on RA with improvement in wheezing    Baseline spiriva qd 18 mcg Breo proair  cpap at night   utd flu and pneumonia vaccine  No 02 dependent  Last hosp for copd 2 y ago  No longer smokes as a rule   (once in a while grabs a cig) -lost her husband in the summer   Flu screen Results for orders placed or performed in visit on 06/29/17  POC Influenza A&B(BINAX/QUICKVUE)  Result Value Ref Range   Influenza A, POC Negative Negative   Influenza B, POC Negative Negative     Patient Active Problem List   Diagnosis Date Noted  . Grief 02/24/2017  . Chronic respiratory failure with hypoxia (HCC) 07/31/2015  . Type 2 diabetes, controlled, with peripheral neuropathy (HCC)   . COPD exacerbation (HCC) 09/14/2014  . OSA on CPAP 09/14/2014  . Health maintenance examination 09/14/2014  . Upper airway cough syndrome 09/22/2013  . Submandibular lymphadenopathy 08/21/2013  . Chronic pain syndrome 05/30/2013  . Migraine, unspecified, without mention of intractable migraine without mention of status migrainosus 05/30/2013  . Acute sinus infection 06/20/2012  . Pyelonephritis 05/05/2012  . Asthmatic bronchitis 02/27/2008  . LEUKOCYTOSIS UNSPECIFIED 02/22/2008  . NONSPECIFIC ABNORMAL ELECTROCARDIOGRAM 02/22/2008  . HYPERTENSION, BENIGN ESSENTIAL  06/23/2007  . HYPERTRIGLYCERIDEMIA 06/01/2007  . Obesity, morbid, BMI 50 or higher (HCC) 06/01/2007  . Adjustment disorder with anxiety 06/01/2007  . Smoker 06/01/2007   Past Medical History:  Diagnosis Date  . Acute pancreatitis   . Asthma    main dyspnea thought due to deconditioning (10/2013)  . Bipolar I disorder, most recent episode (or current) manic, unspecified    pt denies this  . Bronchitis, chronic obstructive (HCC) 2008 & 2009   FeV1 64% TLC 105% DLCO 55% 2008  -  FeV1 81% FeF 25-75 43% 2009  . COPD (chronic obstructive pulmonary disease) (HCC)    emphysema, not an issue per pulm (10/2013)  . History of pyelonephritis 05/2012  . HTN (hypertension)   . Hx of migraines   . Knee pain, right   . Leukocytosis, unspecified   . Lumbar disc disease with radiculopathy    multilevel spondylitic changes, scoliosis, and anterolisthesis L4 not thought to currently be surg candidate (Dr. Phoebe Perch, [facility]) (Dr. Ollen Bowl, [place])  . Nicotine addiction   . Nonspecific abnormal electrocardiogram (ECG) (EKG)   . Obesity   . Pure hyperglyceridemia   . Suicide attempt (HCC) 06/2001  . Swelling of limb   . Urge and stress incontinence 07/2012   ([facility])   Past Surgical History:  Procedure Laterality Date  . CHOLECYSTECTOMY  07/1982  . CT MAXILLOFACIAL WO/W CM  06/14/06   Left max  mucocele  . CT of chest  06/14/2006   Normal except c/w active inflammation / infection.  Left renal atrophy  . ERCP / sphincterotomy - stenosis  01/15/06  . lumbar MRI  11/2010   multilevel spondylitic changes with upper lumbar scoliosis and anterolisthesis of L4 on 5 with some L foraminal narrowing esp at L/3 with concavity of scoliosis, some central stenosis and biforaminal narrowing at L4/5 with spondylolisthesis  . [facility] - Pneumonia, acute asthma, left maxillary sinusitis  01/11 - 06/18/2006  . Retained stone     ERCP secondary to retained stone  . Right knee surgery  1984,1989,1989,1991,1994    Reconstructions for ACL insuff  . TUBAL LIGATION     Social History   Tobacco Use  . Smoking status: Former Smoker    Packs/day: 0.50    Years: 30.00    Pack years: 15.00    Types: Cigarettes    Last attempt to quit: 07/17/2015    Years since quitting: 1.9  . Smokeless tobacco: Never Used  Substance Use Topics  . Alcohol use: No    Alcohol/week: 0.0 oz  . Drug use: No   Family History  Problem Relation Age of Onset  . Heart disease Brother        MI in early 4540's  . Hypertension Mother   . Hyperlipidemia Mother   . Hypertension Father   . Asthma Father   . Breast cancer Paternal Grandmother    Allergies  Allergen Reactions  . Metolazone     REACTION: metabolic mood swings   Current Outpatient Medications on File Prior to Visit  Medication Sig Dispense Refill  . albuterol (PROVENTIL) (2.5 MG/3ML) 0.083% nebulizer solution INHALE 1 VIAL VIA NEBULIZER EVERY 6 HOURS AS NEEDED FOR WHEEZING/SHORT BREATH 150 mL 3  . BREO ELLIPTA 200-25 MCG/INH AEPB INHALE 1 PUFF INTO THE LUNGS DAILY. 60 each 3  . cetirizine (ZYRTEC) 10 MG tablet Take 10 mg by mouth daily as needed for allergies.    . citalopram (CELEXA) 10 MG tablet TAKE 1 TABLET BY MOUTH DAILY. MUST SCHEDULE ANNUAL EXAM FOR REFILLS 90 tablet 0  . citalopram (CELEXA) 20 MG tablet Take 1 tablet (20 mg total) daily by mouth. 90 tablet 1  . cyanocobalamin 500 MCG tablet Take 1 tablet (500 mcg total) by mouth daily. Vit B12    . fluticasone (FLONASE) 50 MCG/ACT nasal spray SPRAY TWICE INTO EACH NOSTRIL EVERY DAY 48 g 0  . gabapentin (NEURONTIN) 300 MG capsule Take 1 capsule (300 mg total) by mouth 2 (two) times daily. 180 capsule 3  . guaiFENesin (MUCINEX) 600 MG 12 hr tablet Take 1 tablet (600 mg total) by mouth 2 (two) times daily. 30 tablet 0  . hydrochlorothiazide (HYDRODIURIL) 25 MG tablet Take 1 tablet (25 mg total) by mouth daily. 90 tablet 3  . HYDROcodone-homatropine (HYCODAN) 5-1.5 MG/5ML syrup Take 5 mLs by mouth every 8  (eight) hours as needed for cough. 120 mL 0  . LORazepam (ATIVAN) 0.5 MG tablet TAKE 1/2 TO 1 TABLET BY MOUTH TWICE A DAY AS NEEDED 30 tablet 0  . metFORMIN (GLUCOPHAGE) 500 MG tablet TAKE 1 TABLET BY MOUTH EVERYDAY AT BEDTIME 90 tablet 1  . nystatin (MYCOSTATIN) 100000 UNIT/ML suspension TAKE 1 TEASPOONFUL BY MOUTH 3 TIMES A DAY FOR 10 DAYS 150 mL 0  . PROAIR HFA 108 (90 Base) MCG/ACT inhaler INHALE 2 PUFFS INTO THE LUNGS EVERY 6 (SIX) HOURS AS NEEDED FOR WHEEZING OR SHORTNESS OF BREATH. 8.5  Inhaler 2  . PROAIR RESPICLICK 108 (90 Base) MCG/ACT AEPB INHALE 2 PUFFS INTO THE LUNGS EVERY 6 HOURS AS NEEDED FOR DYSPNEA/WHEEZE 2 each 3  . SPIRIVA HANDIHALER 18 MCG inhalation capsule PLACE 1 CAPSULE INTO INHALER AND INHALE DAILY. 30 capsule 8   No current facility-administered medications on file prior to visit.     Review of Systems  Constitutional: Positive for appetite change and fatigue. Negative for fever.  HENT: Positive for congestion, postnasal drip, rhinorrhea, sinus pressure, sneezing and sore throat. Negative for ear pain.   Eyes: Negative for pain and discharge.  Respiratory: Positive for cough, shortness of breath and wheezing. Negative for stridor.   Cardiovascular: Negative for chest pain.  Gastrointestinal: Negative for diarrhea, nausea and vomiting.  Genitourinary: Negative for frequency, hematuria and urgency.  Musculoskeletal: Negative for arthralgias and myalgias.  Skin: Negative for rash.  Neurological: Positive for headaches. Negative for dizziness, weakness and light-headedness.  Psychiatric/Behavioral: Negative for confusion and dysphoric mood.       Objective:   Physical Exam  Constitutional: She appears well-developed and well-nourished. No distress.  Obese and fatigued appearing   HENT:  Head: Normocephalic and atraumatic.  Right Ear: External ear normal.  Left Ear: External ear normal.  Mouth/Throat: Oropharynx is clear and moist.  Nares are injected and  congested  No sinus tenderness Clear rhinorrhea and post nasal drip   Eyes: Conjunctivae and EOM are normal. Pupils are equal, round, and reactive to light. Right eye exhibits no discharge. Left eye exhibits no discharge.  Neck: Normal range of motion. Neck supple.  Cardiovascular: Normal rate and normal heart sounds.  Pulmonary/Chest: Effort normal. No respiratory distress. She has wheezes. She has no rales. She exhibits no tenderness.  Diffusely distant bs  Loud exp wheezes with mildly prolonged exp phase  No rales  Few scattered rhonchi   Dec in wheeze after nMT  Pulse ox improved to 95% on RA   Lymphadenopathy:    She has no cervical adenopathy.  Neurological: She is alert.  Skin: Skin is warm and dry. No rash noted. No pallor.  Psychiatric: She has a normal mood and affect.          Assessment & Plan:   Problem List Items Addressed This Visit      Respiratory   COPD exacerbation (HCC) - Primary    With wheeze/sob and uri symptoms  Improved pulse ox to 95% on RA after one albuterol NMT Neg flu swab tx with 80 mg depo medrol IM Prednisone 40 mg taper Tessalon and prometh-DM for cough  Pt has albuterol nmt at home to use every 4 hours  Rest/supp care  Cover with doxycycline Rev red flags for ER visit- sob/inc wheeze  F/u 2 d with pcp for a re check       Relevant Medications   albuterol (PROVENTIL) (2.5 MG/3ML) 0.083% nebulizer solution 2.5 mg (Completed)   predniSONE (DELTASONE) 10 MG tablet   benzonatate (TESSALON) 200 MG capsule   promethazine-dextromethorphan (PROMETHAZINE-DM) 6.25-15 MG/5ML syrup   methylPREDNISolone acetate (DEPO-MEDROL) injection 80 mg (Completed)    Other Visit Diagnoses    Wheezing       Relevant Medications   albuterol (PROVENTIL) (2.5 MG/3ML) 0.083% nebulizer solution 2.5 mg (Completed)   methylPREDNISolone acetate (DEPO-MEDROL) injection 80 mg (Completed)   Other Relevant Orders   POC Influenza A&B(BINAX/QUICKVUE) (Completed)    Cough       Relevant Orders   POC Influenza A&B(BINAX/QUICKVUE) (Completed)

## 2017-07-01 ENCOUNTER — Encounter: Payer: Self-pay | Admitting: Family Medicine

## 2017-07-01 ENCOUNTER — Ambulatory Visit: Payer: BLUE CROSS/BLUE SHIELD | Admitting: Family Medicine

## 2017-07-01 VITALS — BP 136/74 | HR 86 | Temp 98.9°F | Resp 24 | Wt 272.5 lb

## 2017-07-01 DIAGNOSIS — F17200 Nicotine dependence, unspecified, uncomplicated: Secondary | ICD-10-CM

## 2017-07-01 DIAGNOSIS — J441 Chronic obstructive pulmonary disease with (acute) exacerbation: Secondary | ICD-10-CM

## 2017-07-01 MED ORDER — IPRATROPIUM BROMIDE 0.02 % IN SOLN
0.5000 mg | Freq: Once | RESPIRATORY_TRACT | Status: AC
  Administered 2017-07-01: 0.5 mg via RESPIRATORY_TRACT

## 2017-07-01 MED ORDER — ALBUTEROL SULFATE (2.5 MG/3ML) 0.083% IN NEBU
2.5000 mg | INHALATION_SOLUTION | Freq: Once | RESPIRATORY_TRACT | Status: AC
  Administered 2017-07-01: 2.5 mg via RESPIRATORY_TRACT

## 2017-07-01 NOTE — Progress Notes (Signed)
BP 136/74 (BP Location: Left Arm, Patient Position: Sitting, Cuff Size: Large)   Pulse 86   Temp 98.9 F (37.2 C) (Oral)   Resp (!) 24   Wt 272 lb 8 oz (123.6 kg)   LMP 03/08/2013   SpO2 93%   BMI 51.49 kg/m    CC: f/u COPD exacerbation Subjective:    Patient ID: [name], female    DOB: 23-Dec-1960, 57 y.o.   MRN: [id number]  HPI: HAFSAH HENDLER is a 57 y.o. female presenting on 07/01/2017 for COPD (Exercerbation follow-up.)   Seen Tuesday by Dr Milinda Antis with COPD exacerbation treated with 80 mg depomedrol IM, prednisone taper, and doxy course as well as tessalon perls and phenergan-DM syrup. She is using nebulizer every 4 hours. She is not feeling any better. Low grade fevers. Cough productive of mucous, increased sputum productive, increased dyspnea and wheeze. Flu swab negative at that time.   Known COPD on breo, spiriva, proair PRN and CPAP at night.  Rare smoker - quit in the last few years.   Relevant past medical, surgical, family and social history reviewed and updated as indicated. Interim medical history since our last visit reviewed. Allergies and medications reviewed and updated. Outpatient Medications Prior to Visit  Medication Sig Dispense Refill  . albuterol (PROVENTIL) (2.5 MG/3ML) 0.083% nebulizer solution INHALE 1 VIAL VIA NEBULIZER EVERY 6 HOURS AS NEEDED FOR WHEEZING/SHORT BREATH 150 mL 3  . benzonatate (TESSALON) 200 MG capsule Take 1 capsule (200 mg total) by mouth 3 (three) times daily as needed for cough. Swallow whole, to not bite pill 30 capsule 1  . BREO ELLIPTA 200-25 MCG/INH AEPB INHALE 1 PUFF INTO THE LUNGS DAILY. 60 each 3  . cetirizine (ZYRTEC) 10 MG tablet Take 10 mg by mouth daily as needed for allergies.    . citalopram (CELEXA) 20 MG tablet Take 1 tablet (20 mg total) daily by mouth. 90 tablet 1  . cyanocobalamin 500 MCG tablet Take 1 tablet (500 mcg total) by mouth daily. Vit B12    . doxycycline (VIBRA-TABS) 100 MG tablet Take 1 tablet (100  mg total) by mouth 2 (two) times daily. 20 tablet 0  . fluticasone (FLONASE) 50 MCG/ACT nasal spray SPRAY TWICE INTO EACH NOSTRIL EVERY DAY 48 g 0  . gabapentin (NEURONTIN) 300 MG capsule Take 1 capsule (300 mg total) by mouth 2 (two) times daily. 180 capsule 3  . guaiFENesin (MUCINEX) 600 MG 12 hr tablet Take 1 tablet (600 mg total) by mouth 2 (two) times daily. 30 tablet 0  . hydrochlorothiazide (HYDRODIURIL) 25 MG tablet Take 1 tablet (25 mg total) by mouth daily. 90 tablet 3  . LORazepam (ATIVAN) 0.5 MG tablet TAKE 1/2 TO 1 TABLET BY MOUTH TWICE A DAY AS NEEDED 30 tablet 0  . metFORMIN (GLUCOPHAGE) 500 MG tablet TAKE 1 TABLET BY MOUTH EVERYDAY AT BEDTIME 90 tablet 1  . nystatin (MYCOSTATIN) 100000 UNIT/ML suspension TAKE 1 TEASPOONFUL BY MOUTH 3 TIMES A DAY FOR 10 DAYS 150 mL 0  . predniSONE (DELTASONE) 10 MG tablet Take 4 pills by mouth once daily for 3 days, then 3 pills once daily for 3 days, then 2 pills once daily for 3 days, then 1 pill once daily for 3 days and then stop 30 tablet 0  . PROAIR HFA 108 (90 Base) MCG/ACT inhaler INHALE 2 PUFFS INTO THE LUNGS EVERY 6 (SIX) HOURS AS NEEDED FOR WHEEZING OR SHORTNESS OF BREATH. 8.5 Inhaler 2  . PROAIR  RESPICLICK 108 (90 Base) MCG/ACT AEPB INHALE 2 PUFFS INTO THE LUNGS EVERY 6 HOURS AS NEEDED FOR DYSPNEA/WHEEZE 2 each 3  . SPIRIVA HANDIHALER 18 MCG inhalation capsule PLACE 1 CAPSULE INTO INHALER AND INHALE DAILY. 30 capsule 8  . promethazine-dextromethorphan (PROMETHAZINE-DM) 6.25-15 MG/5ML syrup Take 5 mLs by mouth 4 (four) times daily as needed for cough. (Patient not taking: Reported on 07/01/2017) 150 mL 0  . citalopram (CELEXA) 10 MG tablet TAKE 1 TABLET BY MOUTH DAILY. MUST SCHEDULE ANNUAL EXAM FOR REFILLS 90 tablet 0  . HYDROcodone-homatropine (HYCODAN) 5-1.5 MG/5ML syrup Take 5 mLs by mouth every 8 (eight) hours as needed for cough. 120 mL 0   No facility-administered medications prior to visit.      Per HPI unless specifically indicated  in ROS section below Review of Systems     Objective:    BP 136/74 (BP Location: Left Arm, Patient Position: Sitting, Cuff Size: Large)   Pulse 86   Temp 98.9 F (37.2 C) (Oral)   Resp (!) 24   Wt 272 lb 8 oz (123.6 kg)   LMP 03/08/2013   SpO2 93%   BMI 51.49 kg/m   Wt Readings from Last 3 Encounters:  07/01/17 272 lb 8 oz (123.6 kg)  06/29/17 272 lb (123.4 kg)  02/24/17 266 lb 8 oz (120.9 kg)    Physical Exam  Constitutional: She appears well-developed and well-nourished. No distress.  HENT:  Head: Normocephalic and atraumatic.  Mouth/Throat: Oropharynx is clear and moist. No oropharyngeal exudate.  Cardiovascular: Normal rate, regular rhythm, normal heart sounds and intact distal pulses.  No murmur heard. Pulmonary/Chest: She is in respiratory distress. She has decreased breath sounds. She has wheezes (difuse expiratory with prolonged expiratory phase). She has no rhonchi. She has no rales.  Speaks in short sentences  Musculoskeletal: She exhibits no edema.  Skin: Skin is warm and dry. No rash noted.  Psychiatric: She has a normal mood and affect.  Nursing note and vitals reviewed.  Results for orders placed or performed in visit on 06/29/17  POC Influenza A&B(BINAX/QUICKVUE)  Result Value Ref Range   Influenza A, POC Negative Negative   Influenza B, POC Negative Negative   After two albuterol/atrovent nebs - improved air movement, persistent wheezing but less short winded.     Assessment & Plan:   Problem List Items Addressed This Visit    COPD exacerbation (HCC) - Primary    Ongoing COPD exacerbation despite recent appropriate treatment (doxy, prednisone and depo medrol IM).  Treated in office with albuterol/atrovent neb x2, with improvement in air movement and subjective improvement. Persistent wheezing but overall improved. Will continue outpt management with close monitoring. Will start alb/atrovent duonebs (she has at home) in place of spiriva until feeling  better, continue other treatments as up to now.  I asked her to call us tomorrow with update and RTC Mon for recheck. She has pulse ox at home. Discussed ER precautions and pt agrees to call 911 if any respiratory distress.        Relevant Medications   albuterol (PROVENTIL) (2.5 MG/3ML) 0.083% nebulizer solution 2.5 mg (Completed)   ipratropium (ATROVENT) nebulizer solution 0.5 mg (Completed)   Smoker    Has significant decreased - only rare cigarette endorsed now.          Follow up plan: Return in about 4 days (around 07/05/2017), or if symptoms worsen or fail to improve, for follow up visit.  Eustaquio BoydenJavier Cendy Oconnor, MD

## 2017-07-01 NOTE — Patient Instructions (Addendum)
Ongoing COPD exacerbation.  Albuterol atrovent neb today x2 Continue treatment course as up to now.  If any worsening over weekend, seek ER care. Call us tomorrow with an update, return on Monday for recheck.

## 2017-07-01 NOTE — Assessment & Plan Note (Addendum)
Has significant decreased - only rare cigarette endorsed now.

## 2017-07-01 NOTE — Assessment & Plan Note (Signed)
Ongoing COPD exacerbation despite recent appropriate treatment (doxy, prednisone and depo medrol IM).  Treated in office with albuterol/atrovent neb x2, with improvement in air movement and subjective improvement. Persistent wheezing but overall improved. Will continue outpt management with close monitoring. Will start alb/atrovent duonebs (she has at home) in place of spiriva until feeling better, continue other treatments as up to now.  I asked her to call us tomorrow with update and RTC Mon for recheck. She has pulse ox at home. Discussed ER precautions and pt agrees to call 911 if any respiratory distress.

## 2017-07-05 ENCOUNTER — Ambulatory Visit: Payer: BLUE CROSS/BLUE SHIELD | Admitting: Family Medicine

## 2017-07-05 ENCOUNTER — Encounter (HOSPITAL_COMMUNITY): Payer: Self-pay | Admitting: Emergency Medicine

## 2017-07-05 ENCOUNTER — Other Ambulatory Visit: Payer: Self-pay

## 2017-07-05 ENCOUNTER — Inpatient Hospital Stay (HOSPITAL_COMMUNITY)
Admission: EM | Admit: 2017-07-05 | Discharge: 2017-07-08 | DRG: 190 | Disposition: A | Discharge status: Home / Self Care | Payer: BLUE CROSS/BLUE SHIELD | Attending: Internal Medicine | Admitting: Internal Medicine

## 2017-07-05 ENCOUNTER — Encounter: Payer: Self-pay | Admitting: Family Medicine

## 2017-07-05 ENCOUNTER — Emergency Department (HOSPITAL_COMMUNITY): Payer: BLUE CROSS/BLUE SHIELD

## 2017-07-05 VITALS — BP 140/82 | HR 87 | Temp 98.5°F | Wt 270.5 lb

## 2017-07-05 DIAGNOSIS — F4322 Adjustment disorder with anxiety: Secondary | ICD-10-CM | POA: Diagnosis present

## 2017-07-05 DIAGNOSIS — Z888 Allergy status to other drugs, medicaments and biological substances status: Secondary | ICD-10-CM

## 2017-07-05 DIAGNOSIS — R0602 Shortness of breath: Secondary | ICD-10-CM | POA: Diagnosis not present

## 2017-07-05 DIAGNOSIS — Z9989 Dependence on other enabling machines and devices: Secondary | ICD-10-CM | POA: Diagnosis not present

## 2017-07-05 DIAGNOSIS — Z825 Family history of asthma and other chronic lower respiratory diseases: Secondary | ICD-10-CM | POA: Diagnosis not present

## 2017-07-05 DIAGNOSIS — T380X5A Adverse effect of glucocorticoids and synthetic analogues, initial encounter: Secondary | ICD-10-CM | POA: Diagnosis present

## 2017-07-05 DIAGNOSIS — Z7984 Long term (current) use of oral hypoglycemic drugs: Secondary | ICD-10-CM

## 2017-07-05 DIAGNOSIS — Z7951 Long term (current) use of inhaled steroids: Secondary | ICD-10-CM

## 2017-07-05 DIAGNOSIS — I10 Essential (primary) hypertension: Secondary | ICD-10-CM | POA: Diagnosis present

## 2017-07-05 DIAGNOSIS — Z803 Family history of malignant neoplasm of breast: Secondary | ICD-10-CM

## 2017-07-05 DIAGNOSIS — Z6841 Body Mass Index (BMI) 40.0 and over, adult: Secondary | ICD-10-CM | POA: Diagnosis present

## 2017-07-05 DIAGNOSIS — M419 Scoliosis, unspecified: Secondary | ICD-10-CM | POA: Diagnosis present

## 2017-07-05 DIAGNOSIS — G4733 Obstructive sleep apnea (adult) (pediatric): Secondary | ICD-10-CM

## 2017-07-05 DIAGNOSIS — F319 Bipolar disorder, unspecified: Secondary | ICD-10-CM | POA: Diagnosis present

## 2017-07-05 DIAGNOSIS — E1169 Type 2 diabetes mellitus with other specified complication: Secondary | ICD-10-CM | POA: Diagnosis present

## 2017-07-05 DIAGNOSIS — D72829 Elevated white blood cell count, unspecified: Secondary | ICD-10-CM | POA: Diagnosis present

## 2017-07-05 DIAGNOSIS — J9621 Acute and chronic respiratory failure with hypoxia: Secondary | ICD-10-CM | POA: Diagnosis present

## 2017-07-05 DIAGNOSIS — Z8249 Family history of ischemic heart disease and other diseases of the circulatory system: Secondary | ICD-10-CM | POA: Diagnosis not present

## 2017-07-05 DIAGNOSIS — E1142 Type 2 diabetes mellitus with diabetic polyneuropathy: Secondary | ICD-10-CM | POA: Diagnosis present

## 2017-07-05 DIAGNOSIS — Z7952 Long term (current) use of systemic steroids: Secondary | ICD-10-CM | POA: Diagnosis not present

## 2017-07-05 DIAGNOSIS — F17210 Nicotine dependence, cigarettes, uncomplicated: Secondary | ICD-10-CM | POA: Diagnosis present

## 2017-07-05 DIAGNOSIS — J441 Chronic obstructive pulmonary disease with (acute) exacerbation: Principal | ICD-10-CM | POA: Diagnosis present

## 2017-07-05 DIAGNOSIS — E119 Type 2 diabetes mellitus without complications: Secondary | ICD-10-CM | POA: Diagnosis present

## 2017-07-05 DIAGNOSIS — G894 Chronic pain syndrome: Secondary | ICD-10-CM | POA: Diagnosis present

## 2017-07-05 DIAGNOSIS — F39 Unspecified mood [affective] disorder: Secondary | ICD-10-CM | POA: Diagnosis present

## 2017-07-05 LAB — GLUCOSE, CAPILLARY: Glucose-Capillary: 243 mg/dL — ABNORMAL HIGH (ref 65–99)

## 2017-07-05 LAB — BASIC METABOLIC PANEL
Anion gap: 10 (ref 5–15)
BUN: 28 mg/dL — ABNORMAL HIGH (ref 6–20)
CO2: 27 mmol/L (ref 22–32)
Calcium: 9.1 mg/dL (ref 8.9–10.3)
Chloride: 100 mmol/L — ABNORMAL LOW (ref 101–111)
Creatinine, Ser: 1.05 mg/dL — ABNORMAL HIGH (ref 0.44–1.00)
GFR calc Af Amer: >60 mL/min (ref >60)
GFR calc non Af Amer: 57 mL/min — ABNORMAL LOW (ref >60)
Glucose, Bld: 247 mg/dL — ABNORMAL HIGH (ref 65–99)
Potassium: 4.1 mmol/L (ref 3.5–5.1)
Sodium: 137 mmol/L (ref 135–145)

## 2017-07-05 LAB — MRSA PCR SCREENING: MRSA by PCR: NEGATIVE

## 2017-07-05 LAB — CBC WITH DIFFERENTIAL/PLATELET
Basophils Absolute: 0 10*3/uL (ref 0.0–0.1)
Basophils Relative: 0 %
Eosinophils Absolute: 0 10*3/uL (ref 0.0–0.7)
Eosinophils Relative: 0 %
HCT: 43.1 % (ref 36.0–46.0)
Hemoglobin: 14.5 g/dL (ref 12.0–15.0)
Lymphocytes Relative: 16 %
Lymphs Abs: 2.4 10*3/uL (ref 0.7–4.0)
MCH: 29.5 pg (ref 26.0–34.0)
MCHC: 33.6 g/dL (ref 30.0–36.0)
MCV: 87.8 fL (ref 78.0–100.0)
Monocytes Absolute: 0.4 10*3/uL (ref 0.1–1.0)
Monocytes Relative: 3 %
Neutro Abs: 12.5 10*3/uL — ABNORMAL HIGH (ref 1.7–7.7)
Neutrophils Relative %: 81 %
Platelets: 246 10*3/uL (ref 150–400)
RBC: 4.91 MIL/uL (ref 3.87–5.11)
RDW: 15 % (ref 11.5–15.5)
WBC: 15.3 10*3/uL — ABNORMAL HIGH (ref 4.0–10.5)

## 2017-07-05 MED ORDER — INSULIN ASPART 100 UNIT/ML ~~LOC~~ SOLN
0.0000 [IU] | Freq: Three times a day (TID) | SUBCUTANEOUS | Status: DC
  Administered 2017-07-05: 3 [IU] via SUBCUTANEOUS

## 2017-07-05 MED ORDER — METHYLPREDNISOLONE SODIUM SUCC 125 MG IJ SOLR
80.0000 mg | Freq: Two times a day (BID) | INTRAMUSCULAR | Status: DC
  Administered 2017-07-05 – 2017-07-06 (×2): 80 mg via INTRAVENOUS
  Filled 2017-07-05 (×2): qty 2

## 2017-07-05 MED ORDER — LORAZEPAM 0.5 MG PO TABS: 0.5000 mg | ORAL_TABLET | Freq: Two times a day (BID) | ORAL | Status: DC | PRN

## 2017-07-05 MED ORDER — BENZONATATE 100 MG PO CAPS
200.0000 mg | ORAL_CAPSULE | Freq: Three times a day (TID) | ORAL | Status: DC | PRN
  Administered 2017-07-06 – 2017-07-07 (×2): 200 mg via ORAL
  Filled 2017-07-05 (×2): qty 2

## 2017-07-05 MED ORDER — ONDANSETRON HCL 4 MG PO TABS: 4.0000 mg | ORAL_TABLET | Freq: Four times a day (QID) | ORAL | Status: DC | PRN

## 2017-07-05 MED ORDER — ENOXAPARIN SODIUM 60 MG/0.6ML ~~LOC~~ SOLN
60.0000 mg | SUBCUTANEOUS | Status: DC
  Administered 2017-07-05 – 2017-07-07 (×3): 60 mg via SUBCUTANEOUS
  Filled 2017-07-05 (×4): qty 0.6

## 2017-07-05 MED ORDER — IPRATROPIUM BROMIDE 0.02 % IN SOLN: 0.5000 mg | Freq: Four times a day (QID) | RESPIRATORY_TRACT | Status: DC

## 2017-07-05 MED ORDER — ONDANSETRON HCL 4 MG/2ML IJ SOLN: 4.0000 mg | Freq: Four times a day (QID) | INTRAMUSCULAR | Status: DC | PRN

## 2017-07-05 MED ORDER — HYDROCHLOROTHIAZIDE 25 MG PO TABS
25.0000 mg | ORAL_TABLET | Freq: Every day | ORAL | Status: DC
  Administered 2017-07-06 – 2017-07-08 (×3): 25 mg via ORAL
  Filled 2017-07-05 (×3): qty 1

## 2017-07-05 MED ORDER — IPRATROPIUM BROMIDE 0.02 % IN SOLN
0.5000 mg | Freq: Once | RESPIRATORY_TRACT | Status: AC
  Administered 2017-07-05: 0.5 mg via RESPIRATORY_TRACT
  Filled 2017-07-05: qty 2.5

## 2017-07-05 MED ORDER — SODIUM CHLORIDE 0.9% FLUSH
3.0000 mL | Freq: Two times a day (BID) | INTRAVENOUS | Status: DC
  Administered 2017-07-05 – 2017-07-07 (×5): 3 mL via INTRAVENOUS

## 2017-07-05 MED ORDER — SODIUM CHLORIDE 0.9 % IV SOLN: 250.0000 mL | INTRAVENOUS | Status: DC | PRN

## 2017-07-05 MED ORDER — IPRATROPIUM BROMIDE 0.02 % IN SOLN
0.5000 mg | Freq: Once | RESPIRATORY_TRACT | Status: AC
Start: 2017-07-05 — End: 2017-07-05
  Administered 2017-07-05: 0.5 mg via RESPIRATORY_TRACT

## 2017-07-05 MED ORDER — SODIUM CHLORIDE 0.9 % IV SOLN
INTRAVENOUS | Status: DC
  Administered 2017-07-05: 14:00:00 via INTRAVENOUS

## 2017-07-05 MED ORDER — MAGNESIUM SULFATE 2 GM/50ML IV SOLN
2.0000 g | Freq: Once | INTRAVENOUS | Status: AC
  Administered 2017-07-05: 2 g via INTRAVENOUS
  Filled 2017-07-05: qty 50

## 2017-07-05 MED ORDER — IPRATROPIUM-ALBUTEROL 0.5-2.5 (3) MG/3ML IN SOLN
3.0000 mL | Freq: Four times a day (QID) | RESPIRATORY_TRACT | Status: DC
  Administered 2017-07-05 – 2017-07-08 (×10): 3 mL via RESPIRATORY_TRACT
  Filled 2017-07-05 (×8): qty 3

## 2017-07-05 MED ORDER — SODIUM CHLORIDE 0.9% FLUSH: 3.0000 mL | INTRAVENOUS | Status: DC | PRN

## 2017-07-05 MED ORDER — METHYLPREDNISOLONE SODIUM SUCC 125 MG IJ SOLR
125.0000 mg | Freq: Once | INTRAMUSCULAR | Status: AC
  Administered 2017-07-05: 125 mg via INTRAVENOUS
  Filled 2017-07-05: qty 2

## 2017-07-05 MED ORDER — ALBUTEROL SULFATE (2.5 MG/3ML) 0.083% IN NEBU: 2.5000 mg | INHALATION_SOLUTION | Freq: Four times a day (QID) | RESPIRATORY_TRACT | Status: DC

## 2017-07-05 MED ORDER — ALBUTEROL (5 MG/ML) CONTINUOUS INHALATION SOLN
10.0000 mg/h | INHALATION_SOLUTION | Freq: Once | RESPIRATORY_TRACT | Status: AC
  Administered 2017-07-05: 10 mg/h via RESPIRATORY_TRACT
  Filled 2017-07-05: qty 20

## 2017-07-05 MED ORDER — GABAPENTIN 300 MG PO CAPS
300.0000 mg | ORAL_CAPSULE | Freq: Two times a day (BID) | ORAL | Status: DC
  Administered 2017-07-05 – 2017-07-08 (×6): 300 mg via ORAL
  Filled 2017-07-05 (×6): qty 1

## 2017-07-05 MED ORDER — ALBUTEROL SULFATE (2.5 MG/3ML) 0.083% IN NEBU
2.5000 mg | INHALATION_SOLUTION | Freq: Once | RESPIRATORY_TRACT | Status: AC
  Administered 2017-07-05: 2.5 mg via RESPIRATORY_TRACT

## 2017-07-05 MED ORDER — DOXYCYCLINE HYCLATE 100 MG PO TABS
100.0000 mg | ORAL_TABLET | Freq: Two times a day (BID) | ORAL | Status: DC
  Administered 2017-07-05 – 2017-07-06 (×2): 100 mg via ORAL
  Filled 2017-07-05 (×2): qty 1

## 2017-07-05 MED ORDER — CITALOPRAM HYDROBROMIDE 20 MG PO TABS
20.0000 mg | ORAL_TABLET | Freq: Every day | ORAL | Status: DC
  Administered 2017-07-06 – 2017-07-08 (×3): 20 mg via ORAL
  Filled 2017-07-05 (×3): qty 1

## 2017-07-05 MED ORDER — ALBUTEROL SULFATE (2.5 MG/3ML) 0.083% IN NEBU
2.5000 mg | INHALATION_SOLUTION | RESPIRATORY_TRACT | Status: DC | PRN
  Administered 2017-07-06: 2.5 mg via RESPIRATORY_TRACT
  Filled 2017-07-05: qty 3

## 2017-07-05 MED ORDER — GUAIFENESIN ER 600 MG PO TB12
600.0000 mg | ORAL_TABLET | Freq: Two times a day (BID) | ORAL | Status: DC
  Administered 2017-07-05 – 2017-07-08 (×6): 600 mg via ORAL
  Filled 2017-07-05 (×7): qty 1

## 2017-07-05 NOTE — Patient Instructions (Signed)
Go to ER for failed outpatient COPD management.

## 2017-07-05 NOTE — ED Notes (Signed)
Per PCP-states patient has failed outpatient COPD rehab-states steroids and antibiotics are not helping-states she is still wheezing and SOB upon exertion

## 2017-07-05 NOTE — ED Provider Notes (Signed)
[facility]-EMERGENCY DEPT Provider Note   CSN: [id number] Arrival date & time: 07/05/17  1207     History   Chief Complaint Chief Complaint  Patient presents with  . Shortness of Breath  . Cough    HPI  Brenda [name] is a 57 y.o. Female with history of COPD, hypertension, diabetes and obesity, presents to the ED for evaluation of shortness of breath, coughing and dyspnea.  Patient reports this is been going on and worsening for the past week, she seen her doctor 3 times over the past week, last Tuesday she was started on prednisone, doxycycline, and tessalon perles, as well as her home nebulizers, patient also tested negative for the flu at her PCPs office, despite these treatments, patient has continued to get worse, she saw her PCP today who recommended that she come to the ED, as there is nothing we can do for her on an outpatient basis, she got an albuterol treatment in the office which did not help at all.  Patient reports she is had some low-grade temperatures over the past few days, T-max at home was 100.2.  She reports worsening cough, and shortness of breath, cough occasionally productive of sputum.  Patient denies any chest pain, just reports soreness and muscle spasms in her chest coughing.  Patient denies any nausea, vomiting, abdominal pain or diarrhea.  No urinary symptoms.  Patient reports chronic bilateral lower leg swelling for which she takes HCTZ, this is at baseline and not acutely worsened.      Past Medical History:  Diagnosis Date  . Acute pancreatitis   . Asthma    main dyspnea thought due to deconditioning (10/2013)  . Bipolar I disorder, most recent episode (or current) manic, unspecified    pt denies this  . Bronchitis, chronic obstructive (HCC) 2008 & 2009   FeV1 64% TLC 105% DLCO 55% 2008  -  FeV1 81% FeF 25-75 43% 2009  . COPD (chronic obstructive pulmonary disease) (HCC)    emphysema, not an issue per pulm (10/2013)  . History of  pyelonephritis 05/2012  . HTN (hypertension)   . Hx of migraines   . Knee pain, right   . Leukocytosis, unspecified   . Lumbar disc disease with radiculopathy    multilevel spondylitic changes, scoliosis, and anterolisthesis L4 not thought to currently be surg candidate (Dr. Phoebe PerchHirsch, [facility]) (Dr. Ollen [facility]arkins, [facility])  . Nicotine addiction   . Nonspecific abnormal electrocardiogram (ECG) (EKG)   . Obesity   . Pure hyperglyceridemia   . Suicide attempt (HCC) 06/2001  . Swelling of limb   . Urge and stress incontinence 07/2012   ([facility])    Patient Active Problem List   Diagnosis Date Noted  . Grief 02/24/2017  . Chronic respiratory failure with hypoxia (HCC) 07/31/2015  . Type 2 diabetes, controlled, with peripheral neuropathy (HCC)   . COPD exacerbation (HCC) 09/14/2014  . OSA on CPAP 09/14/2014  . Health maintenance examination 09/14/2014  . Upper airway cough syndrome 09/22/2013  . Submandibular lymphadenopathy 08/21/2013  . Chronic pain syndrome 05/30/2013  . Migraine, unspecified, without mention of intractable migraine without mention of status migrainosus 05/30/2013  . Acute sinus infection 06/20/2012  . Pyelonephritis 05/05/2012  . Asthmatic bronchitis 02/27/2008  . LEUKOCYTOSIS UNSPECIFIED 02/22/2008  . NONSPECIFIC ABNORMAL ELECTROCARDIOGRAM 02/22/2008  . HYPERTENSION, BENIGN ESSENTIAL 06/23/2007  . HYPERTRIGLYCERIDEMIA 06/01/2007  . Obesity, morbid, BMI 50 or higher (HCC) 06/01/2007  . Adjustment disorder with anxiety 06/01/2007  . Smoker 06/01/2007  Past Surgical History:  Procedure Laterality Date  . CHOLECYSTECTOMY  07/1982  . CT MAXILLOFACIAL WO/W CM  06/14/06   Left max mucocele  . CT of chest  06/14/2006   Normal except c/w active inflammation / infection.  Left renal atrophy  . ERCP / sphincterotomy - stenosis  01/15/06  . lumbar MRI  11/2010   multilevel spondylitic changes with upper lumbar scoliosis and anterolisthesis of L4 on 5 with some L foraminal  narrowing esp at L/3 with concavity of scoliosis, some central stenosis and biforaminal narrowing at L4/5 with spondylolisthesis  . [place] - Pneumonia, acute asthma, left maxillary sinusitis  01/11 - 06/18/2006  . Retained stone     ERCP secondary to retained stone  . Right knee surgery  1984,1989,1989,1991,1994   Reconstructions for ACL insuff  . TUBAL LIGATION      OB History    No data available       Home Medications    Prior to Admission medications   Medication Sig Start Date End Date Taking? Authorizing Provider  albuterol (PROVENTIL) (2.5 MG/3ML) 0.083% nebulizer solution INHALE 1 VIAL VIA NEBULIZER EVERY 6 HOURS AS NEEDED FOR WHEEZING/SHORT BREATH 01/08/17  Yes [name], MD  benzonatate (TESSALON) 200 MG capsule Take 1 capsule (200 mg total) by mouth 3 (three) times daily as needed for cough. Swallow whole, to not bite pill 06/29/17  Yes Tower, Marne A, MD  BREO ELLIPTA 200-25 MCG/INH AEPB INHALE 1 PUFF INTO THE LUNGS DAILY. 05/03/17  Yes [name], MD  cetirizine (ZYRTEC) 10 MG tablet Take 10 mg by mouth daily.    Yes [provider]  citalopram (CELEXA) 20 MG tablet Take 1 tablet (20 mg total) daily by mouth. 04/18/17  Yes [name], MD  doxycycline (VIBRA-TABS) 100 MG tablet Take 1 tablet (100 mg total) by mouth 2 (two) times daily. 06/29/17  Yes Tower, Audrie Gallus, MD  fluticasone [facility]) 50 MCG/ACT nasal spray SPRAY TWICE INTO EACH NOSTRIL EVERY DAY 06/28/17  Yes [name], MD  gabapentin (NEURONTIN) 300 MG capsule Take 1 capsule (300 mg total) by mouth 2 (two) times daily. 02/24/17  Yes [name], MD  guaiFENesin (MUCINEX) 600 MG 12 hr tablet Take 1 tablet (600 mg total) by mouth 2 (two) times daily. 07/26/15  Yes Drema Dallas, MD  hydrochlorothiazide (HYDRODIURIL) 25 MG tablet Take 1 tablet (25 mg total) by mouth daily. 02/24/17  Yes [name], MD  ibuprofen (ADVIL) 200 MG tablet Take 400 mg by mouth every 6 (six)  hours as needed for headache.   Yes [provider]  ipratropium-albuterol (DUONEB) 0.5-2.5 (3) MG/3ML SOLN Take 3 mLs by nebulization every 4 (four) hours as needed (For wheezing or shortness of breath.).   Yes [provider]  LORazepam (ATIVAN) 0.5 MG tablet TAKE 1/2 TO 1 TABLET BY MOUTH TWICE A DAY AS NEEDED Patient taking differently: TAKE 1/2 TO 1 TABLET BY MOUTH TWICE A DAY AS NEEDED FOR ANXIETY. 05/30/17  Yes [name], MD  metFORMIN (GLUCOPHAGE) 500 MG tablet TAKE 1 TABLET BY MOUTH EVERYDAY AT BEDTIME Patient taking differently: TAKE 1 TABLET BY MOUTH DAILY 01/21/17  Yes [name], MD  Multiple Vitamin (MULTIVITAMIN WITH MINERALS) TABS tablet Take 1 tablet by mouth daily.   Yes [provider]  nystatin (MYCOSTATIN) 100000 UNIT/ML suspension TAKE 1 TEASPOONFUL BY MOUTH 3 TIMES A DAY FOR 10 DAYS Patient taking differently: TAKE 1 TEASPOONFUL BY MOUTH 3 TIMES DAILY AS NEEDED FOR MOUTH SORES.  05/26/16  Yes [name], MD  predniSONE (DELTASONE) 10 MG tablet Take 4 pills by mouth once daily for 3 days, then 3 pills once daily for 3 days, then 2 pills once daily for 3 days, then 1 pill once daily for 3 days and then stop 06/29/17  Yes Tower, Audrie Gallus, MD  PROAIR HFA 108 (90 Base) MCG/ACT inhaler INHALE 2 PUFFS INTO THE LUNGS EVERY 6 (SIX) HOURS AS NEEDED FOR WHEEZING OR SHORTNESS OF BREATH. 03/30/16  Yes [name], MD  PROAIR RESPICLICK 108 [phone] Base) MCG/ACT AEPB INHALE 2 PUFFS INTO THE LUNGS EVERY 6 HOURS AS NEEDED FOR DYSPNEA/WHEEZE 05/28/17  Yes [name], MD  promethazine-dextromethorphan (PROMETHAZINE-DM) 6.25-15 MG/5ML syrup Take 5 mLs by mouth 4 (four) times daily as needed for cough. 06/29/17  Yes Tower, Audrie Gallus, MD  SPIRIVA HANDIHALER 18 MCG inhalation capsule PLACE 1 CAPSULE INTO INHALER AND INHALE DAILY. 06/14/17  Yes [name], MD  cyanocobalamin 500 MCG tablet Take 1 tablet (500 mcg total) by mouth daily. Vit  B12 Patient not taking: Reported on 07/05/2017 07/25/16   [name], MD    Family History Family History  Problem Relation Age of Onset  . Heart disease Brother        MI in early 7's  . Hypertension Mother   . Hyperlipidemia Mother   . Hypertension Father   . Asthma Father   . Breast cancer Paternal Grandmother     Social History Social History   Tobacco Use  . Smoking status: Former Smoker    Packs/day: 0.50    Years: 30.00    Pack years: 15.00    Types: Cigarettes    Last attempt to quit: 07/17/2015    Years since quitting: 1.9  . Smokeless tobacco: Never Used  Substance Use Topics  . Alcohol use: No    Alcohol/week: 0.0 oz  . Drug use: No     Allergies   Metolazone   Review of Systems Review of Systems  Constitutional: Positive for fever. Negative for chills.  HENT: Negative for congestion, rhinorrhea and sore throat.   Eyes: Negative for discharge, redness and itching.  Respiratory: Positive for cough, chest tightness, shortness of breath and wheezing.   Cardiovascular: Negative for chest pain and palpitations.  Gastrointestinal: Negative for abdominal pain, nausea and vomiting.  Genitourinary: Negative for dysuria and frequency.  Musculoskeletal: Positive for myalgias.  Neurological: Negative for weakness and numbness.     Physical Exam Updated Vital Signs BP 137/73   Pulse (!) 114   Temp 98.5 F (36.9 C) (Oral)   Resp (!) 22   Ht 5\' 1"  (1.549 m)   Wt 122.5 kg (270 lb)   LMP 03/08/2013   SpO2 93%   BMI 51.02 kg/m   Physical Exam  Constitutional: She is oriented to person, place, and time. She appears well-developed and well-nourished. No distress.  HENT:  Head: Normocephalic and atraumatic.  Mouth/Throat: Oropharynx is clear and moist.  Eyes: Right eye exhibits no discharge. Left eye exhibits no discharge.  Neck: Neck supple.  Cardiovascular: Normal rate, regular rhythm and normal heart sounds.  Pulmonary/Chest: Effort normal. No  respiratory distress.  Patient exhibiting some increased respiratory effort even while on 8 L during her continuous albuterol treatment, patient become slightly winded at the end of sentences, lungs with diminished breath sounds throughout, and end expiratory wheezing  Abdominal: Soft. Bowel sounds are normal. She exhibits no distension and no mass. There is no tenderness. There is no guarding.  Musculoskeletal:  Bilateral 1+ edema of the legs, chronic and unchanged  Neurological: She is alert and oriented to person, place, and time. Coordination normal.  Skin: Skin is warm and dry. She is not diaphoretic.  Psychiatric: She has a normal mood and affect. Her behavior is normal.  Nursing note and vitals reviewed.    ED Treatments / Results  Labs (all labs ordered are listed, but only abnormal results are displayed) Labs Reviewed  CBC WITH DIFFERENTIAL/PLATELET - Abnormal; Notable for the following components:      Result Value   WBC 15.3 (*)    Neutro Abs 12.5 (*)    All other components within normal limits  BASIC METABOLIC PANEL - Abnormal; Notable for the following components:   Chloride 100 (*)    Glucose, Bld 247 (*)    BUN 28 (*)    Creatinine, Ser 1.05 (*)    GFR calc non Af Amer 57 (*)    All other components within normal limits    EKG  EKG Interpretation None       Radiology Dg Chest 2 View  Result Date: 07/05/2017 CLINICAL DATA:  Productive cough and shortness of breath for the past week. History of asthma-COPD, former smoker. EXAM: CHEST  2 VIEW COMPARISON:  Portable chest x-ray of July 22, 2015 FINDINGS: The lungs are adequately inflated. There is no focal infiltrate. The interstitial markings are coarse bilaterally. There is no pleural effusion. The heart and pulmonary vascularity are normal. The mediastinum is normal in width. The trachea is midline. There is multilevel degenerative disc disease of the thoracic spine. IMPRESSION: Chronic bronchitic changes,  stable.  No acute pneumonia nor CHF. Electronically Signed   By: David  [place] M.D.   On: 07/05/2017 13:19    Procedures Procedures (including critical care time)  Medications Ordered in ED Medications  0.9 %  sodium chloride infusion ( Intravenous New Bag/Given 07/05/17 1342)  magnesium sulfate IVPB 2 g 50 mL (not administered)  methylPREDNISolone sodium succinate (SOLU-MEDROL) 125 mg/2 mL injection 125 mg (125 mg Intravenous Given 07/05/17 1343)  albuterol (PROVENTIL,VENTOLIN) solution continuous neb (10 mg/hr Nebulization Given 07/05/17 1258)  ipratropium (ATROVENT) nebulizer solution 0.5 mg (0.5 mg Nebulization Given 07/05/17 1258)     Initial Impression / Assessment and Plan / ED Course  I have reviewed the triage vital signs and the nursing notes.  Pertinent labs & imaging results that were available during my care of the patient were reviewed by me and considered in my medical decision making (see chart for details).  Presents to the ED with COPD exacerbation, complaining of shortness of breath, increasing dyspnea and cough.  Negative for the flu at her PCPs office, has continued to worsen despite doxycycline, prednisone taper at home and cough medications in addition to her home albuterol nebulizers.  Afebrile here, tachypneic with O2 sats in the low 90s on room air, no home oxygen.  Patient was some increased respiratory effort, able to speak in full sentences but does become slightly winded towards the end of the sentence, lungs with diminished breath sounds throughout, and end expiratory wheezes.  Patient denies chest pain, just soreness from coughing, no abdominal pain.   CXR and  basic labs pending, patient started continuous albuterol, given Solu-Medrol, and Atrovent as well as IV fluids.  CXR shows evidence of chronic bronchitis but no acute infiltrate or CHF.  Work of breathing improved after continuous albuterol, but patient continues to have wheezing throughout, patient requiring 3  L nasal  cannula, on room air patient desats to 89%.  Will give 2 mg of magnesium.  Lab work shows leukocytosis of 15.3, hemoglobin normal, creatinine slightly increased from baseline of 0.95-1.05, patient receiving IV fluids, glucose elevated at 247, no other electrolyte derangements requiring intervention.  Will consult hospitalist for admission for COPD exacerbation.  Spoke with Dr. Onalee Hua  Who will see and admit the patient.  Patient discussed with Dr. Anitra Lauth who saw and evaluated the patient as well and is in agreement with plan.  Final Clinical Impressions(s) / ED Diagnoses   Final diagnoses:  SOB (shortness of breath)  COPD exacerbation [facility])    ED Discharge Orders    None       Dartha Lodge, [place] 07/05/17 1512    Gwyneth Sprout, MD 07/07/17 1605

## 2017-07-05 NOTE — Addendum Note (Signed)
Addended by: Eustaquio BoydenGUTIERREZ, Loukisha Gunnerson on: 07/05/2017 05:36 PM   Modules accepted: Level of Service

## 2017-07-05 NOTE — Assessment & Plan Note (Signed)
Persistent COPD exacerbation despite appropriate treatment - will recommend ER evaluation for failed outpatient COPD exacerbation. Pt refused EMS - will go straight to ER for evaluation. Will provide alb/atrovent neb prior to sending to hospital. We will call and notify them she is on her way - will have MIL drive her there.

## 2017-07-05 NOTE — H&P (Signed)
History and Physical    Brenda [name]:[id number] DOB: 05-Jun-1960 DOA: 07/05/2017  PCP: [name], MD  Patient coming from: Home  Chief Complaint: Shortness of breath and wheezing  HPI: Brenda [name] is a 57 y.o. female with medical history significant of morbid obesity, COPD, bipolar disorder, obstructive sleep apnea on CPAP comes in with over a week of worsening shortness of breath and wheezing.  Patient is seen her primary care physician several times this week and has been started on doxycycline and prednisone taper.  She has not gotten better.  She denies any fevers or chills.  She denies any swelling in her legs.  She denies any abdominal pain or chest pain.  Patient noted to be in moderate respiratory distress on arrival was given an hour-long nebulizer treatment and feels somewhat better.  She is not anywhere near her baseline respiratory status.  She is on oxygen at night with her CPAP machine.  She has not been able to use her CPAP machine for several days due to her upper respiratory symptoms.  Patient is referred for admission for new oxygen requirement of 3 L nasal cannula and for COPD exacerbation.  Review of Systems: As per HPI otherwise 10 point review of systems negative.   Past Medical History:  Diagnosis Date  . Acute pancreatitis   . Asthma    main dyspnea thought due to deconditioning (10/2013)  . Bipolar I disorder, most recent episode (or current) manic, unspecified    pt denies this  . Bronchitis, chronic obstructive (HCC) 2008 & 2009   FeV1 64% TLC 105% DLCO 55% 2008  -  FeV1 81% FeF 25-75 43% 2009  . COPD (chronic obstructive pulmonary disease) (HCC)    emphysema, not an issue per pulm (10/2013)  . History of pyelonephritis 05/2012  . HTN (hypertension)   . Hx of migraines   . Knee pain, right   . Leukocytosis, unspecified   . Lumbar disc disease with radiculopathy    multilevel spondylitic changes, scoliosis, and anterolisthesis L4 not thought to  currently be surg candidate (Dr. Phoebe Perch, [facility]) (Dr. Ollen Bowl, [place])  . Nicotine addiction   . Nonspecific abnormal electrocardiogram (ECG) (EKG)   . Obesity   . Pure hyperglyceridemia   . Suicide attempt (HCC) 06/2001  . Swelling of limb   . Urge and stress incontinence 07/2012   ([facility])    Past Surgical History:  Procedure Laterality Date  . CHOLECYSTECTOMY  07/1982  . CT MAXILLOFACIAL WO/W CM  06/14/06   Left max mucocele  . CT of chest  06/14/2006   Normal except c/w active inflammation / infection.  Left renal atrophy  . ERCP / sphincterotomy - stenosis  01/15/06  . lumbar MRI  11/2010   multilevel spondylitic changes with upper lumbar scoliosis and anterolisthesis of L4 on 5 with some L foraminal narrowing esp at L/3 with concavity of scoliosis, some central stenosis and biforaminal narrowing at L4/5 with spondylolisthesis  . [facility] - Pneumonia, acute asthma, left maxillary sinusitis  01/11 - 06/18/2006  . Retained stone     ERCP secondary to retained stone  . Right knee surgery  1984,1989,1989,1991,1994   Reconstructions for ACL insuff  . TUBAL LIGATION       reports that she quit smoking about 1 years ago. Her smoking use included cigarettes. She has a 15.00 pack-year smoking history. she has never used smokeless tobacco. She reports that she does not drink alcohol or use drugs.  Allergies  Allergen Reactions  . Metolazone Other (See Comments)    Metabolic mood swings    Family History  Problem Relation Age of Onset  . Heart disease Brother        MI in early 82's  . Hypertension Mother   . Hyperlipidemia Mother   . Hypertension Father   . Asthma Father   . Breast cancer Paternal Grandmother     Prior to Admission medications   Medication Sig Start Date End Date Taking? Authorizing Provider  albuterol (PROVENTIL) (2.5 MG/3ML) 0.083% nebulizer solution INHALE 1 VIAL VIA NEBULIZER EVERY 6 HOURS AS NEEDED FOR WHEEZING/SHORT BREATH 01/08/17  Yes [name], MD  benzonatate (TESSALON) 200 MG capsule Take 1 capsule (200 mg total) by mouth 3 (three) times daily as needed for cough. Swallow whole, to not bite pill 06/29/17  Yes Tower, Marne A, MD  BREO ELLIPTA 200-25 MCG/INH AEPB INHALE 1 PUFF INTO THE LUNGS DAILY. 05/03/17  Yes [name], MD  cetirizine (ZYRTEC) 10 MG tablet Take 10 mg by mouth daily.    Yes [provider]  citalopram (CELEXA) 20 MG tablet Take 1 tablet (20 mg total) daily by mouth. 04/18/17  Yes [name], MD  doxycycline (VIBRA-TABS) 100 MG tablet Take 1 tablet (100 mg total) by mouth 2 (two) times daily. 06/29/17  Yes Tower, Audrie Gallus, MD  fluticasone [facility]) 50 MCG/ACT nasal spray SPRAY TWICE INTO EACH NOSTRIL EVERY DAY 06/28/17  Yes [name], MD  gabapentin (NEURONTIN) 300 MG capsule Take 1 capsule (300 mg total) by mouth 2 (two) times daily. 02/24/17  Yes [name], MD  guaiFENesin (MUCINEX) 600 MG 12 hr tablet Take 1 tablet (600 mg total) by mouth 2 (two) times daily. 07/26/15  Yes Drema Dallas, MD  hydrochlorothiazide (HYDRODIURIL) 25 MG tablet Take 1 tablet (25 mg total) by mouth daily. 02/24/17  Yes [name], MD  ibuprofen (ADVIL) 200 MG tablet Take 400 mg by mouth every 6 (six) hours as needed for headache.   Yes [provider]  ipratropium-albuterol (DUONEB) 0.5-2.5 (3) MG/3ML SOLN Take 3 mLs by nebulization every 4 (four) hours as needed (For wheezing or shortness of breath.).   Yes [provider]  LORazepam (ATIVAN) 0.5 MG tablet TAKE 1/2 TO 1 TABLET BY MOUTH TWICE A DAY AS NEEDED Patient taking differently: TAKE 1/2 TO 1 TABLET BY MOUTH TWICE A DAY AS NEEDED FOR ANXIETY. 05/30/17  Yes [name], MD  metFORMIN (GLUCOPHAGE) 500 MG tablet TAKE 1 TABLET BY MOUTH EVERYDAY AT BEDTIME Patient taking differently: TAKE 1 TABLET BY MOUTH DAILY 01/21/17  Yes [name], MD  Multiple Vitamin (MULTIVITAMIN WITH MINERALS) TABS tablet Take 1 tablet by  mouth daily.   Yes [provider]  nystatin (MYCOSTATIN) 100000 UNIT/ML suspension TAKE 1 TEASPOONFUL BY MOUTH 3 TIMES A DAY FOR 10 DAYS Patient taking differently: TAKE 1 TEASPOONFUL BY MOUTH 3 TIMES DAILY AS NEEDED FOR MOUTH SORES. 05/26/16  Yes [name], MD  predniSONE (DELTASONE) 10 MG tablet Take 4 pills by mouth once daily for 3 days, then 3 pills once daily for 3 days, then 2 pills once daily for 3 days, then 1 pill once daily for 3 days and then stop 06/29/17  Yes Tower, Audrie Gallus, MD  PROAIR HFA 108 (90 Base) MCG/ACT inhaler INHALE 2 PUFFS INTO THE LUNGS EVERY 6 (SIX) HOURS AS NEEDED FOR WHEEZING OR SHORTNESS OF BREATH. 03/30/16  Yes [name], MD  PROAIR RESPICLICK 108 ([phone] Base) MCG/ACT  AEPB INHALE 2 PUFFS INTO THE LUNGS EVERY 6 HOURS AS NEEDED FOR DYSPNEA/WHEEZE 05/28/17  Yes Eustaquio BoydenGutierrez, Javier, MD  promethazine-dextromethorphan (PROMETHAZINE-DM) 6.25-15 MG/5ML syrup Take 5 mLs by mouth 4 (four) times daily as needed for cough. 06/29/17  Yes Tower, Audrie GallusMarne A, MD  SPIRIVA HANDIHALER 18 MCG inhalation capsule PLACE 1 CAPSULE INTO INHALER AND INHALE DAILY. 06/14/17  Yes Eustaquio BoydenGutierrez, Javier, MD  cyanocobalamin 500 MCG tablet Take 1 tablet (500 mcg total) by mouth daily. Vit B12 Patient not taking: Reported on 07/05/2017 07/25/16   Eustaquio BoydenGutierrez, Javier, MD    Physical Exam: Vitals:   07/05/17 1430 07/05/17 1456 07/05/17 1500 07/05/17 1515  BP:  137/73 (!) 124/58   Pulse: (!) 114 (!) 114 (!) 108 (!) 106  Resp: (!) 22 (!) 22 18 17   Temp:      TempSrc:      SpO2: 95% 93% 93% 94%  Weight:      Height:          Constitutional: NAD, calm, comfortable in mild to moderate respiratory distress however able to speak in full sentences at this time Vitals:   07/05/17 1430 07/05/17 1456 07/05/17 1500 07/05/17 1515  BP:  137/73 (!) 124/58   Pulse: (!) 114 (!) 114 (!) 108 (!) 106  Resp: (!) 22 (!) 22 18 17   Temp:      TempSrc:      SpO2: 95% 93% 93% 94%  Weight:      Height:        Eyes: PERRL, lids and conjunctivae normal ENMT: Mucous membranes are moist. Posterior pharynx clear of any exudate or lesions.Normal dentition.  Neck: normal, supple, no masses, no thyromegaly Respiratory: Very diminished to auscultation bilaterally, diffuse wheezing, no crackles. Normal respiratory effort. No accessory muscle use.  Cardiovascular: Regular rate and rhythm, no murmurs / rubs / gallops. No extremity edema. 2+ pedal pulses. No carotid bruits.  Abdomen: no tenderness, no masses palpated. No hepatosplenomegaly. Bowel sounds positive.  Musculoskeletal: no clubbing / cyanosis. No joint deformity upper and lower extremities. Good ROM, no contractures. Normal muscle tone.  Skin: no rashes, lesions, ulcers. No induration Neurologic: CN 2-12 grossly intact. Sensation intact, DTR normal. Strength 5/5 in all 4.  Psychiatric: Normal judgment and insight. Alert and oriented x 3. Normal mood.    Labs on Admission: I have personally reviewed following labs and imaging studies  CBC: Recent Labs  Lab 07/05/17 1340  WBC 15.3*  NEUTROABS 12.5*  HGB 14.5  HCT 43.1  MCV 87.8  PLT 246   Basic Metabolic Panel: Recent Labs  Lab 07/05/17 1340  NA 137  K 4.1  CL 100*  CO2 27  GLUCOSE 247*  BUN 28*  CREATININE 1.05*  CALCIUM 9.1   GFR: Estimated Creatinine Clearance: 71.6 mL/min (A) (by C-G formula based on SCr of 1.05 mg/dL (H)). Liver Function Tests: No results for input(s): AST, ALT, ALKPHOS, BILITOT, PROT, ALBUMIN in the last 168 hours. No results for input(s): LIPASE, AMYLASE in the last 168 hours. No results for input(s): AMMONIA in the last 168 hours. Coagulation Profile: No results for input(s): INR, PROTIME in the last 168 hours. Cardiac Enzymes: No results for input(s): CKTOTAL, CKMB, CKMBINDEX, TROPONINI in the last 168 hours. BNP (last 3 results) No results for input(s): PROBNP in the last 8760 hours. HbA1C: No results for input(s): HGBA1C in the last 72  hours. CBG: No results for input(s): GLUCAP in the last 168 hours. Lipid Profile: No results for input(s): CHOL, HDL,  LDLCALC, TRIG, CHOLHDL, LDLDIRECT in the last 72 hours. Thyroid Function Tests: No results for input(s): TSH, T4TOTAL, FREET4, T3FREE, THYROIDAB in the last 72 hours. Anemia Panel: No results for input(s): VITAMINB12, FOLATE, FERRITIN, TIBC, IRON, RETICCTPCT in the last 72 hours. Urine analysis:    Component Value Date/Time   COLORURINE YELLOW 02/24/2010 0953   APPEARANCEUR CLEAR 02/24/2010 0953   LABSPEC 1.012 02/24/2010 0953   PHURINE 5.5 02/24/2010 0953   GLUCOSEU NEGATIVE 02/24/2010 0953   HGBUR NEGATIVE 02/24/2010 0953   HGBUR negative 02/22/2008 1545   BILIRUBINUR negative 01/12/2017 1150   KETONESUR NEGATIVE 02/24/2010 0953   PROTEINUR +- 01/12/2017 1150   PROTEINUR NEGATIVE 02/24/2010 0953   UROBILINOGEN 0.2 01/12/2017 1150   UROBILINOGEN 0.2 02/24/2010 0953   NITRITE negative 01/12/2017 1150   NITRITE NEGATIVE 02/24/2010 0953   LEUKOCYTESUR Small (1+) (A) 01/12/2017 1150   Sepsis Labs: !!!!!!!!!!!!!!!!!!!!!!!!!!!!!!!!!!!!!!!!!!!! @LABRCNTIP (procalcitonin:4,lacticidven:4) )No results found for this or any previous visit (from the past 240 hour(s)).   Radiological Exams on Admission: Dg Chest 2 View  Result Date: 07/05/2017 CLINICAL DATA:  Productive cough and shortness of breath for the past week. History of asthma-COPD, former smoker. EXAM: CHEST  2 VIEW COMPARISON:  Portable chest x-ray of July 22, 2015 FINDINGS: The lungs are adequately inflated. There is no focal infiltrate. The interstitial markings are coarse bilaterally. There is no pleural effusion. The heart and pulmonary vascularity are normal. The mediastinum is normal in width. The trachea is midline. There is multilevel degenerative disc disease of the thoracic spine. IMPRESSION: Chronic bronchitic changes, stable.  No acute pneumonia nor CHF. Electronically Signed   By: Chaniyah Jahr  [place] M.D.    On: 07/05/2017 13:19    Old chart reviewed Case discussed with EDP Chest x-ray reviewed no edema or infiltrate  Assessment/Plan 57 year old female with morbid obesity comes in with COPD exacerbation that is going on for over a week Principal Problem:   COPD exacerbation (HCC)-chest x-ray is negative.  Continue her oral doxycycline.  We will increase her steroids to IV steroids.  Frequent nebulizer treatments.  Will place in stepdown unit in case she needs to be put on BiPAP later on.  She has a high risk of worsening respiratory status. Active Problems:   Acute on chronic respiratory failure with hypoxia (HCC)-89% on room air.  She is currently on 3 L.   Obesity, morbid, BMI 50 or higher (HCC)-noted   Adjustment disorder with anxiety-continue her chronic Xanax.   Chronic pain syndrome-continue home meds try not to escalate due to her tenuous respiratory status   OSA on CPAP-patient has not been able to tolerate her CPAP due to her current illness   Type 2 diabetes, controlled, with peripheral neuropathy (HCC)-sign scale insulin    DVT prophylaxis: SCDs Code Status: Full Family Communication: Daughter Disposition Plan: Per day team Consults called: None Admission status: Admission   Kanyla Omeara A MD [facility]  If 7PM-7AM, please contact night-coverage [email or web address] Password [email or web address]  07/05/2017, 4:00 PM

## 2017-07-05 NOTE — ED Provider Notes (Signed)
Patient placed in Quick Look pathway, seen and evaluated   Chief Complaint:dyspnea  HPI: 57 year old female with history of COPD presents with increasing cough and dyspnea.  Has been seeing her doctor for similar symptoms and has maximize outpatient therapy.  Saw her doctor today and given albuterol which did make her feel better.  She has had low-grade temperature.  Test was negative for flu last week.   ROS: No vomiting or diarrhea   Physical Exam:   Gen: No distress  Neuro: Awake and Alert  Skin: Warm    Focused Exam: Diminished breath sounds bilaterally with end expiratory wheezing   Initiation of care has begun. The patient has been counseled on the process, plan, and necessity for staying for the completion/evaluation, and the remainder of the medical screening examination    Lorre NickAllen, Lc Joynt, MD 07/05/17 1231

## 2017-07-05 NOTE — Progress Notes (Signed)
BP 140/82 (BP Location: Left Arm, Patient Position: Sitting, Cuff Size: Large)   Pulse 87   Temp 98.5 F (36.9 C) (Oral)   Wt 270 lb 8 oz (122.7 kg)   LMP 03/08/2013   SpO2 94%   BMI 51.11 kg/m    CC: COPD exacerbation f/u Subjective:    Patient ID: Brenda [name], female    DOB: Oct 19, 1960, 57 y.o.   MRN: [id number]  HPI: Brenda BarrenJudy L Steuber is a 57 y.o. female presenting on 07/05/2017 for COPD (Pt is here for breathing recheck after COPD exacerbation on 07/02/17.  States O2 is 92% or 93% when sitting still.  But drops to mid 80s after moving for a bit.)   See prior notes for details.  Dx COPD exacerbation after URI that started 1 wk ago, treated with prednisone taper, doxy course, and depo medrol IM on 06/29/2017. Currently on 30mg  prednisone dose. Seen in f/u on Friday 2/1 with albuterol/atrovent neb x2 with subjective improvement in wheeze. Sent home with duonebs in place of spiriva - she has been using Q4 hours, last one was at 7am this morning. Last night O2 sat dropped to 82% after going to the bathroom. It did increase after she rested at home.   Known COPD with asthma on breo, spiriva, proair PRN and CPAP at night. Last saw pulm Dr Craige CottaSood 2017. Rare smoker, quit in the last 2 yrs but still with rare cigarette use intermittently.  Denies fevers.   Relevant past medical, surgical, family and social history reviewed and updated as indicated. Interim medical history since our last visit reviewed. Allergies and medications reviewed and updated. Outpatient Medications Prior to Visit  Medication Sig Dispense Refill  . albuterol (PROVENTIL) (2.5 MG/3ML) 0.083% nebulizer solution INHALE 1 VIAL VIA NEBULIZER EVERY 6 HOURS AS NEEDED FOR WHEEZING/SHORT BREATH 150 mL 3  . benzonatate (TESSALON) 200 MG capsule Take 1 capsule (200 mg total) by mouth 3 (three) times daily as needed for cough. Swallow whole, to not bite pill 30 capsule 1  . BREO ELLIPTA 200-25 MCG/INH AEPB INHALE 1 PUFF INTO THE LUNGS  DAILY. 60 each 3  . cetirizine (ZYRTEC) 10 MG tablet Take 10 mg by mouth daily as needed for allergies.    . citalopram (CELEXA) 20 MG tablet Take 1 tablet (20 mg total) daily by mouth. 90 tablet 1  . cyanocobalamin 500 MCG tablet Take 1 tablet (500 mcg total) by mouth daily. Vit B12    . doxycycline (VIBRA-TABS) 100 MG tablet Take 1 tablet (100 mg total) by mouth 2 (two) times daily. 20 tablet 0  . fluticasone (FLONASE) 50 MCG/ACT nasal spray SPRAY TWICE INTO EACH NOSTRIL EVERY DAY 48 g 0  . gabapentin (NEURONTIN) 300 MG capsule Take 1 capsule (300 mg total) by mouth 2 (two) times daily. 180 capsule 3  . guaiFENesin (MUCINEX) 600 MG 12 hr tablet Take 1 tablet (600 mg total) by mouth 2 (two) times daily. 30 tablet 0  . hydrochlorothiazide (HYDRODIURIL) 25 MG tablet Take 1 tablet (25 mg total) by mouth daily. 90 tablet 3  . LORazepam (ATIVAN) 0.5 MG tablet TAKE 1/2 TO 1 TABLET BY MOUTH TWICE A DAY AS NEEDED 30 tablet 0  . metFORMIN (GLUCOPHAGE) 500 MG tablet TAKE 1 TABLET BY MOUTH EVERYDAY AT BEDTIME 90 tablet 1  . nystatin (MYCOSTATIN) 100000 UNIT/ML suspension TAKE 1 TEASPOONFUL BY MOUTH 3 TIMES A DAY FOR 10 DAYS 150 mL 0  . predniSONE (DELTASONE) 10 MG tablet Take 4  pills by mouth once daily for 3 days, then 3 pills once daily for 3 days, then 2 pills once daily for 3 days, then 1 pill once daily for 3 days and then stop 30 tablet 0  . PROAIR HFA 108 (90 Base) MCG/ACT inhaler INHALE 2 PUFFS INTO THE LUNGS EVERY 6 (SIX) HOURS AS NEEDED FOR WHEEZING OR SHORTNESS OF BREATH. 8.5 Inhaler 2  . PROAIR RESPICLICK 108 (90 Base) MCG/ACT AEPB INHALE 2 PUFFS INTO THE LUNGS EVERY 6 HOURS AS NEEDED FOR DYSPNEA/WHEEZE 2 each 3  . promethazine-dextromethorphan (PROMETHAZINE-DM) 6.25-15 MG/5ML syrup Take 5 mLs by mouth 4 (four) times daily as needed for cough. 150 mL 0  . SPIRIVA HANDIHALER 18 MCG inhalation capsule PLACE 1 CAPSULE INTO INHALER AND INHALE DAILY. 30 capsule 8   No facility-administered medications  prior to visit.      Per HPI unless specifically indicated in ROS section below Review of Systems     Objective:    BP 140/82 (BP Location: Left Arm, Patient Position: Sitting, Cuff Size: Large)   Pulse 87   Temp 98.5 F (36.9 C) (Oral)   Wt 270 lb 8 oz (122.7 kg)   LMP 03/08/2013   SpO2 94%   BMI 51.11 kg/m   Wt Readings from Last 3 Encounters:  07/05/17 270 lb 8 oz (122.7 kg)  07/01/17 272 lb 8 oz (123.6 kg)  06/29/17 272 lb (123.4 kg)    Physical Exam  Constitutional: She appears well-developed and well-nourished. No distress.  HENT:  Mouth/Throat: Oropharynx is clear and moist. No oropharyngeal exudate.  Cardiovascular: Normal rate, regular rhythm and normal heart sounds.  No murmur heard. Pulmonary/Chest: She is in respiratory distress (shortwinded). She has decreased breath sounds. She has wheezes (diffuse polysymphonic exp wheezing). She has no rhonchi. She has no rales.  Prolonged expiratory phase  Musculoskeletal: She exhibits no edema.  Skin: Skin is warm and dry. No rash noted.  Psychiatric: She has a normal mood and affect.  Nursing note and vitals reviewed.  Results for orders placed or performed in visit on 06/29/17  POC Influenza A&B(BINAX/QUICKVUE)  Result Value Ref Range   Influenza A, POC Negative Negative   Influenza B, POC Negative Negative   After alb/atrovent neb - improved air movement, persistent dsypnea diffuse wheezing and prolonged expiratory phase    Assessment & Plan:   Problem List Items Addressed This Visit    COPD exacerbation (HCC) - Primary    Persistent COPD exacerbation despite appropriate treatment - will recommend ER evaluation for failed outpatient COPD exacerbation. Pt refused EMS - will go straight to ER for evaluation. Will provide alb/atrovent neb prior to sending to hospital. We will call and notify them she is on her way - will have MIL drive her there.       Relevant Medications   albuterol (PROVENTIL) (2.5 MG/3ML)  0.083% nebulizer solution 2.5 mg (Completed)   ipratropium (ATROVENT) nebulizer solution 0.5 mg (Completed)       Follow up plan: Return if symptoms worsen or fail to improve.  [name], MD

## 2017-07-05 NOTE — ED Notes (Signed)
Pt placed on 3 lpm of Oxygen pt was sat. 89%, in order  to maintain Saturation at  94%.

## 2017-07-05 NOTE — Progress Notes (Signed)
Pt gave permission for her daughter to remain in her room while questions were being asked on the nursing admission hx. Briscoe Burns. Carleane Gerasimos Plotts BSN, RN-BC Admissions RN  07/05/2017  4:08 PM

## 2017-07-05 NOTE — ED Triage Notes (Signed)
Pt reports with SOB and worsening of COPD. Has cough that had blood tinge to it yesterday.  Reports saw her PCP today and was sent to ED since there was nothing else they could do for her at the office.

## 2017-07-06 LAB — GLUCOSE, CAPILLARY
Glucose-Capillary: 230 mg/dL — ABNORMAL HIGH (ref 65–99)
Glucose-Capillary: 206 mg/dL — ABNORMAL HIGH (ref 65–99)
Glucose-Capillary: 168 mg/dL — ABNORMAL HIGH (ref 65–99)
Glucose-Capillary: 154 mg/dL — ABNORMAL HIGH (ref 65–99)
Glucose-Capillary: 260 mg/dL — ABNORMAL HIGH (ref 65–99)

## 2017-07-06 LAB — CBC
HCT: 42.7 % (ref 36.0–46.0)
Hemoglobin: 14.4 g/dL (ref 12.0–15.0)
MCH: 29.9 pg (ref 26.0–34.0)
MCHC: 33.7 g/dL (ref 30.0–36.0)
MCV: 88.8 fL (ref 78.0–100.0)
Platelets: 277 10*3/uL (ref 150–400)
RBC: 4.81 MIL/uL (ref 3.87–5.11)
RDW: 14.9 % (ref 11.5–15.5)
WBC: 19.9 10*3/uL — ABNORMAL HIGH (ref 4.0–10.5)

## 2017-07-06 LAB — BASIC METABOLIC PANEL
Anion gap: 11 (ref 5–15)
BUN: 25 mg/dL — ABNORMAL HIGH (ref 6–20)
CO2: 27 mmol/L (ref 22–32)
Calcium: 9 mg/dL (ref 8.9–10.3)
Chloride: 97 mmol/L — ABNORMAL LOW (ref 101–111)
Creatinine, Ser: 0.9 mg/dL (ref 0.44–1.00)
GFR calc Af Amer: >60 mL/min (ref >60)
GFR calc non Af Amer: >60 mL/min (ref >60)
Glucose, Bld: 178 mg/dL — ABNORMAL HIGH (ref 65–99)
Potassium: 4.2 mmol/L (ref 3.5–5.1)
Sodium: 135 mmol/L (ref 135–145)

## 2017-07-06 LAB — HEMOGLOBIN A1C
Hgb A1c MFr Bld: 7.1 % — ABNORMAL HIGH (ref 4.8–5.6)
Mean Plasma Glucose: 157.07 mg/dL

## 2017-07-06 MED ORDER — DOXYCYCLINE HYCLATE 100 MG PO TABS
100.0000 mg | ORAL_TABLET | Freq: Two times a day (BID) | ORAL | Status: DC
  Administered 2017-07-06 – 2017-07-08 (×4): 100 mg via ORAL
  Filled 2017-07-06 (×4): qty 1

## 2017-07-06 MED ORDER — METHYLPREDNISOLONE SODIUM SUCC 125 MG IJ SOLR
80.0000 mg | Freq: Two times a day (BID) | INTRAMUSCULAR | Status: AC
  Administered 2017-07-06: 80 mg via INTRAVENOUS
  Filled 2017-07-06: qty 2

## 2017-07-06 MED ORDER — PREDNISONE 20 MG PO TABS
40.0000 mg | ORAL_TABLET | Freq: Every day | ORAL | Status: DC
  Administered 2017-07-07 – 2017-07-08 (×2): 40 mg via ORAL
  Filled 2017-07-06 (×3): qty 2

## 2017-07-06 MED ORDER — ACETAMINOPHEN 325 MG PO TABS: 650.0000 mg | ORAL_TABLET | Freq: Four times a day (QID) | ORAL | Status: DC | PRN

## 2017-07-06 MED ORDER — INSULIN ASPART 100 UNIT/ML ~~LOC~~ SOLN
0.0000 [IU] | Freq: Every day | SUBCUTANEOUS | Status: DC
  Administered 2017-07-06: 3 [IU] via SUBCUTANEOUS
  Administered 2017-07-07: 2 [IU] via SUBCUTANEOUS

## 2017-07-06 MED ORDER — NICOTINE POLACRILEX 2 MG MT GUM
2.0000 mg | CHEWING_GUM | OROMUCOSAL | Status: DC | PRN
  Filled 2017-07-06: qty 1

## 2017-07-06 MED ORDER — INSULIN ASPART 100 UNIT/ML ~~LOC~~ SOLN
0.0000 [IU] | Freq: Three times a day (TID) | SUBCUTANEOUS | Status: DC
  Administered 2017-07-06 (×2): 2 [IU] via SUBCUTANEOUS
  Administered 2017-07-06: 3 [IU] via SUBCUTANEOUS
  Administered 2017-07-07: 5 [IU] via SUBCUTANEOUS
  Administered 2017-07-07: 2 [IU] via SUBCUTANEOUS
  Administered 2017-07-07: 1 [IU] via SUBCUTANEOUS
  Administered 2017-07-08: 2 [IU] via SUBCUTANEOUS
  Administered 2017-07-08: 3 [IU] via SUBCUTANEOUS

## 2017-07-06 MED ORDER — NICOTINE 7 MG/24HR TD PT24
7.0000 mg | MEDICATED_PATCH | Freq: Every day | TRANSDERMAL | Status: DC
  Administered 2017-07-06 – 2017-07-08 (×3): 7 mg via TRANSDERMAL
  Filled 2017-07-06 (×3): qty 1

## 2017-07-06 MED ORDER — IBUPROFEN 200 MG PO TABS
400.0000 mg | ORAL_TABLET | Freq: Four times a day (QID) | ORAL | Status: DC | PRN
  Administered 2017-07-06: 400 mg via ORAL
  Filled 2017-07-06: qty 2

## 2017-07-06 MED ORDER — SALINE SPRAY 0.65 % NA SOLN
1.0000 | NASAL | Status: DC | PRN
  Administered 2017-07-06: 1 via NASAL
  Filled 2017-07-06: qty 44

## 2017-07-06 NOTE — Progress Notes (Addendum)
PROGRESS NOTE    Brenda [name]  ZOX:[id number] DOB: 1960-08-20 DOA: 07/05/2017 PCP: [name], MD   Brief Narrative:   [name] is [name] 57 y.o. female with medical history significant of morbid obesity, COPD, bipolar disorder, obstructive sleep apnea on CPAP comes in with over Brenda [name] week of worsening shortness of breath and wheezing.  Patient is seen her primary care physician several times this week and has been started on doxycycline and prednisone taper.  She has not gotten better.  She denies any fevers or chills.  She denies any swelling in her legs.  She denies any abdominal pain or chest pain.  Patient noted to be in moderate respiratory distress on arrival was given an hour-long nebulizer treatment and feels somewhat better.  She is not anywhere near her baseline respiratory status.  She is on oxygen at night with her CPAP machine.  She has not been able to use her CPAP machine for several days due to her upper respiratory symptoms.  Patient is referred for admission for new oxygen requirement of 3 L nasal cannula and for COPD exacerbation.  Assessment & Plan:   Principal Problem:   COPD exacerbation (HCC) Active Problems:   Obesity, morbid, BMI 50 or higher (HCC)   Adjustment disorder with anxiety   Chronic pain syndrome   OSA on CPAP   Type 2 diabetes, controlled, with peripheral neuropathy (HCC)   Acute on chronic respiratory failure with hypoxia (HCC)  COPD exacerbation  Acute Hypoxic Respiratory Failure:  Currrently on 2 L [place].  Negative CXR.  Negative flu.  Current smoker.  Doxycycline 100 mg BID Transition to PO steroids tomorrow Wean O2 to goal 88-92% Smoking cessation discussed, nicotine patch ordered  Obesity, morbid, BMI 50 or higher (HCC)-noted  Adjustment disorder with anxiety-continue her chronic ativan (PMP aware checked).   Chronic pain syndrome- chronic back pain, takes NSAIDs, will continue  OSA on CPAP-patient has not been able to tolerate her CPAP  due to her current illness   Type 2 diabetes, controlled, with peripheral neuropathy (HCC)- SSI.  Follow up A1c.   DVT prophylaxis: lovenox Code Status: full  Family Communication: none at bedside Disposition Plan: pending improvement   Consultants:   none  Procedures:   none  Antimicrobials: Anti-infectives (From admission, onward)   Start     Dose/Rate Route Frequency Ordered Stop   07/06/17 2200  doxycycline (VIBRA-TABS) tablet 100 mg     100 mg Oral 2 times daily 07/06/17 1847 07/10/17 2159   07/05/17 2200  doxycycline (VIBRA-TABS) tablet 100 mg  Status:  Discontinued     100 mg Oral 2 times daily 07/05/17 1604 07/06/17 1847      Subjective: Feels better. Still SOB. No O2 at home. Doxycycline x1 week now.   Last bad exacerbation 2 years ago.  Last steroids within past year x1.  Works in nursing home.  Objective: Vitals:   07/06/17 0305 07/06/17 0400 07/06/17 0600 07/06/17 0734  BP:  (!) 172/84 (!) 156/85   Pulse:  84 81   Resp:  15    Temp: 97.8 F (36.6 C)     TempSrc: Oral     SpO2:  93% 95% 94%  Weight: 121.6 kg (268 lb 1.3 oz)     Height:        Intake/Output Summary (Last 24 hours) at 07/06/2017 0744 Last data filed at 07/05/2017 2228 Gross per 24 hour  Intake 220 ml  Output 300 ml  Net -80  ml   Filed Weights   07/05/17 1348 07/05/17 1741 07/06/17 0305  Weight: 122.5 kg (270 lb) 123.3 kg (271 lb 13.2 oz) 121.6 kg (268 lb 1.3 oz)    Examination:  General exam: Appears calm and comfortable  Respiratory system: Shortened sentences, slightly increased WOB.  Decreased breath sounds posteriorly.  Anterior wheezing.  Cardiovascular system: S1 & S2 heard, RRR. No JVD, murmurs, rubs, gallops or clicks. No pedal edema. Gastrointestinal system: Abdomen is nondistended, soft and nontender. No organomegaly or masses felt. Normal bowel sounds heard. Central nervous system: Alert and oriented. No focal neurological deficits. Extremities: Symmetric 5 x 5  power. Skin: No rashes, lesions or ulcers Psychiatry: Judgement and insight appear normal. Mood & affect appropriate.     Data Reviewed: I have personally reviewed following labs and imaging studies  CBC: Recent Labs  Lab 07/05/17 1340 07/06/17 0328  WBC 15.3* 19.9*  NEUTROABS 12.5*  --   HGB 14.5 14.4  HCT 43.1 42.7  MCV 87.8 88.8  PLT 246 277   Basic Metabolic Panel: Recent Labs  Lab 07/05/17 1340 07/06/17 0328  NA 137 135  K 4.1 4.2  CL 100* 97*  CO2 27 27  GLUCOSE 247* 178*  BUN 28* 25*  CREATININE 1.05* 0.90  CALCIUM 9.1 9.0   GFR: Estimated Creatinine Clearance: 83.1 mL/min (by C-G formula based on SCr of 0.9 mg/dL). Liver Function Tests: No results for input(s): AST, ALT, ALKPHOS, BILITOT, PROT, ALBUMIN in the last 168 hours. No results for input(s): LIPASE, AMYLASE in the last 168 hours. No results for input(s): AMMONIA in the last 168 hours. Coagulation Profile: No results for input(s): INR, PROTIME in the last 168 hours. Cardiac Enzymes: No results for input(s): CKTOTAL, CKMB, CKMBINDEX, TROPONINI in the last 168 hours. BNP (last 3 results) No results for input(s): PROBNP in the last 8760 hours. HbA1C: No results for input(s): HGBA1C in the last 72 hours. CBG: Recent Labs  Lab 07/05/17 2105  GLUCAP 243*   Lipid Profile: No results for input(s): CHOL, HDL, LDLCALC, TRIG, CHOLHDL, LDLDIRECT in the last 72 hours. Thyroid Function Tests: No results for input(s): TSH, T4TOTAL, FREET4, T3FREE, THYROIDAB in the last 72 hours. Anemia Panel: No results for input(s): VITAMINB12, FOLATE, FERRITIN, TIBC, IRON, RETICCTPCT in the last 72 hours. Sepsis Labs: No results for input(s): PROCALCITON, LATICACIDVEN in the last 168 hours.  Recent Results (from the past 240 hour(s))  MRSA PCR Screening     Status: None   Collection Time: 07/05/17  5:30 PM  Result Value Ref Range Status   MRSA by PCR NEGATIVE NEGATIVE Final    Comment:        The GeneXpert MRSA  Assay (FDA approved for NASAL specimens only), is one component of [facility] comprehensive MRSA colonization surveillance program. It is not intended to diagnose MRSA infection nor to guide or monitor treatment for MRSA infections. Performed at [facility], [address] [place], [place] [address]          Radiology Studies: Dg Chest 2 View  Result Date: 07/05/2017 CLINICAL DATA:  Productive cough and shortness of breath for the past week. History of asthma-COPD, former smoker. EXAM: CHEST  2 VIEW COMPARISON:  Portable chest x-ray of July 22, 2015 FINDINGS: The lungs are adequately inflated. There is no focal infiltrate. The interstitial markings are coarse bilaterally. There is no pleural effusion. The heart and pulmonary vascularity are normal. The mediastinum is normal in width. The trachea is midline. There  is multilevel degenerative disc disease of the thoracic spine. IMPRESSION: Chronic bronchitic changes, stable.  No acute pneumonia nor CHF. Electronically Signed   By: David  [place] M.D.   On: 07/05/2017 13:19        Scheduled Meds: . citalopram  20 mg Oral Daily  . doxycycline  100 mg Oral BID  . enoxaparin (LOVENOX) injection  60 mg Subcutaneous Q24H  . gabapentin  300 mg Oral BID  . guaiFENesin  600 mg Oral BID  . hydrochlorothiazide  25 mg Oral Daily  . insulin aspart  0-9 Units Subcutaneous TID WC  . ipratropium-albuterol  3 mL Nebulization Q6H  . methylPREDNISolone (SOLU-MEDROL) injection  80 mg Intravenous Q12H  . [START ON 07/07/2017] predniSONE  40 mg Oral Q breakfast  . sodium chloride flush  3 mL Intravenous Q12H   Continuous Infusions: . sodium chloride       LOS: 1 day    Time spent: over 30 min    Lacretia Nicks, MD [facility] Pager [phone]   If 7PM-7AM, please contact night-coverage [email or web address] Password [facility] 07/06/2017, 7:44 AM

## 2017-07-06 NOTE — Care Management Note (Signed)
Case Management Note  Patient Details  Name: Brenda [name] MRN: [id number] Date of Birth: 1960-08-07  Subjective/Objective:                  resp distress  Action/Plan: Date: July 06, 2017 Brenda [name], BSN, [place], [place] [phone] Chart and notes review for patient progress and needs. Will follow for case management and discharge needs. No cm or discharge needs present at time of this review. Next review date: [id number]  Expected Discharge Date:  (unknown)               Expected Discharge Plan:  Home/Self Care  In-House Referral:     Discharge planning Services  CM Consult  Post Acute Care Choice:    Choice offered to:     DME Arranged:    DME Agency:     HH Arranged:    HH Agency:     Status of Service:  In process, will continue to follow  If discussed at Long Length of Stay Meetings, dates discussed:    Additional Comments:  Brenda [name], Brenda Lynn, RN 07/06/2017, 8:49 AM

## 2017-07-06 NOTE — Progress Notes (Signed)
Nutrition Brief Note  Patient identified on the Malnutrition Screening Tool (MST) Report  Wt Readings from Last 15 Encounters:  07/06/17 268 lb 1.3 oz (121.6 kg)  07/05/17 270 lb 8 oz (122.7 kg)  07/01/17 272 lb 8 oz (123.6 kg)  06/29/17 272 lb (123.4 kg)  02/24/17 266 lb 8 oz (120.9 kg)  01/12/17 262 lb 6.4 oz (119 kg)  11/16/16 267 lb (121.1 kg)  08/07/16 256 lb (116.1 kg)  07/24/16 260 lb 4 oz (118 kg)  05/28/16 258 lb 12 oz (117.4 kg)  11/04/15 274 lb (124.3 kg)  10/25/15 272 lb 8 oz (123.6 kg)  08/05/15 275 lb (124.7 kg)  07/30/15 266 lb (120.7 kg)  07/20/15 278 lb 3.5 oz (126.2 kg)    Body mass index is 50.65 kg/m. Patient meets criteria for morbid obesity based on current BMI. Skin WDL. Pt with PMH of morbid obesity, COPD, bipolar disorder, OSA. She presented to the ED with 1 week of worsening SOB and wheezing.   Current diet order is Regular.  Medications reviewed; 25 mg Hydrodiuril/day, sliding scale Novolog, 2 g IV Mg sulfate x1 run yesterday, 125 mg Solu-medrol x1 dose yesterday, 80 mg Solu-medrol BID, 40 mg Deltasone/day. Labs reviewed; Cl: 97 mmol/L, BUN: 25 mg/dL.  No nutrition interventions warranted at this time. If nutrition issues arise, please consult RD.     Trenton GammonJessica Navreet Bolda, MS, RD, LDN, [facility] Inpatient Clinical Dietitian Pager # [phone] After hours/weekend pager # [phone]

## 2017-07-07 DIAGNOSIS — J441 Chronic obstructive pulmonary disease with (acute) exacerbation: Principal | ICD-10-CM

## 2017-07-07 LAB — BASIC METABOLIC PANEL
Anion gap: 10 (ref 5–15)
BUN: 29 mg/dL — ABNORMAL HIGH (ref 6–20)
CO2: 29 mmol/L (ref 22–32)
Calcium: 9 mg/dL (ref 8.9–10.3)
Chloride: 97 mmol/L — ABNORMAL LOW (ref 101–111)
Creatinine, Ser: 0.91 mg/dL (ref 0.44–1.00)
GFR calc Af Amer: >60 mL/min (ref >60)
GFR calc non Af Amer: >60 mL/min (ref >60)
Glucose, Bld: 212 mg/dL — ABNORMAL HIGH (ref 65–99)
Potassium: 4.4 mmol/L (ref 3.5–5.1)
Sodium: 136 mmol/L (ref 135–145)

## 2017-07-07 LAB — CBC
HCT: 43 % (ref 36.0–46.0)
Hemoglobin: 14.3 g/dL (ref 12.0–15.0)
MCH: 29.5 pg (ref 26.0–34.0)
MCHC: 33.3 g/dL (ref 30.0–36.0)
MCV: 88.8 fL (ref 78.0–100.0)
Platelets: 288 10*3/uL (ref 150–400)
RBC: 4.84 MIL/uL (ref 3.87–5.11)
RDW: 15.1 % (ref 11.5–15.5)
WBC: 20.5 10*3/uL — ABNORMAL HIGH (ref 4.0–10.5)

## 2017-07-07 LAB — GLUCOSE, CAPILLARY
Glucose-Capillary: 264 mg/dL — ABNORMAL HIGH (ref 65–99)
Glucose-Capillary: 178 mg/dL — ABNORMAL HIGH (ref 65–99)
Glucose-Capillary: 148 mg/dL — ABNORMAL HIGH (ref 65–99)
Glucose-Capillary: 164 mg/dL — ABNORMAL HIGH (ref 65–99)
Glucose-Capillary: 257 mg/dL — ABNORMAL HIGH (ref 65–99)
Glucose-Capillary: 249 mg/dL — ABNORMAL HIGH (ref 65–99)

## 2017-07-07 NOTE — Progress Notes (Signed)
SATURATION QUALIFICATIONS: (This note is used to comply with regulatory documentation for home oxygen)  Patient Saturations on Room Air at Rest = 92  Patient Saturations on Room Air while Ambulating = 86%  Patient Saturations on 2 Liters of oxygen while Ambulating = 94 %  Please briefly explain why patient needs home oxygen:o2 sat dropped while walking

## 2017-07-07 NOTE — Progress Notes (Addendum)
PROGRESS NOTE    Brenda [name]  ZOX:[id number] DOB: Nov 09, 1960 DOA: 07/05/2017 PCP: [name], MD   Brief Narrative: Brenda Labree [name] a 57 y.o.femalewith medical history significant ofmorbid obesity, COPD, bipolar disorder, obstructive sleep apnea on CPAP comes in with over a week of worsening shortness of breath and wheezing. Patient was seen her primary care physician several times this week and had been started on doxycycline and prednisone taper. She did not gotten better. She denied any fevers or chills Patient noted to be in moderate respiratory distress on arrival was given an hour-long nebulizer treatment and feels somewhat better. She was not anywhere near her baseline respiratory status. She was on oxygen at night with her CPAP machine. She has not been able to use her CPAP machine for several days due to her upper respiratory symptoms. Patient is referred for admission for new oxygen requirement of 3 L nasal cannula and for COPD exacerbation and currently  she is being managed for that.    Assessment & Plan:   Principal Problem:   COPD exacerbation (HCC) Active Problems:   Obesity, morbid, BMI 50 or higher (HCC)   Adjustment disorder with anxiety   Chronic pain syndrome   OSA on CPAP   Type 2 diabetes, controlled, with peripheral neuropathy (HCC)   Acute on chronic respiratory failure with hypoxia (HCC)  Acute on chronic respiratory failure with hypoxia/COPD exacerbation: Still requires oxygen for saturation maintenance.  Most likely she would qualify for home oxygen. She is not on oxygen at home.   She follows with [facility] as an outpatient.   Started on doxycycline 100 mg twice daily.  Flu was negative on presentation.  Chest x-ray did not show any pneumonia. Steroids tapered to oral today  Smoker: She smokes occasionally.  Currently on nicotine patch  Adjustment disorder with anxiety: On Ativan at home.  Continue for now  Chronic pain  syndrome: Has chronic back pain.  Supportive care  Obstructive sleep apnea: Supposed to be on CPAP at home.  Patient had not been able to tolerate CPAP due to her current illness .  Type 2 diabetes mellitus:Hb A1C 7.1.  Will resume her home regimen on discharge  Leukocytosis: Most likely secondary to steroids.  We will continue to monitor the trend    DVT prophylaxis: Lovenox Code Status: Full Family Communication: Family member present at the bedside Disposition Plan: Tomorrow to home   Consultants: None  Procedures: None  Antimicrobials: Doxycycline since 07/05/17  Subjective: Patient seen and examined the bedside this morning.  Was sitting on the bed.  Respiratory status has improved.  But continues to have wheezing and require supplemental oxygen for saturation maintenance.  Objective: Vitals:   07/07/17 0513 07/07/17 1051 07/07/17 1440 07/07/17 1515  BP: 120/63  131/76   Pulse: 67  83   Resp: 20  14   Temp: 97.8 F (36.6 C)  98 F (36.7 C)   TempSrc: Oral  Oral   SpO2: 97% (!) 89% 96% 94%  Weight:      Height:        Intake/Output Summary (Last 24 hours) at 07/07/2017 1527 Last data filed at 07/07/2017 1440 Gross per 24 hour  Intake 600 ml  Output -  Net 600 ml   Filed Weights   07/05/17 1741 07/06/17 0305 07/06/17 1626  Weight: 123.3 kg (271 lb 13.2 oz) 121.6 kg (268 lb 1.3 oz) 121.8 kg (268 lb 8.3 oz)    Examination:  General exam:  Appears calm and comfortable ,Not in distress,obese Respiratory system: Bilateral decreased air entry, bilateral expiratory wheezes  cardiovascular system: S1 & S2 heard, RRR. No JVD, murmurs, rubs, gallops or clicks. No pedal edema. Gastrointestinal system: Abdomen is nondistended, soft and nontender. No organomegaly or masses felt. Normal bowel sounds heard. Central nervous system: Alert and oriented. No focal neurological deficits. Extremities: No edema, no clubbing ,no cyanosis, distal peripheral pulses palpable. Skin: No  rashes, lesions or ulcers,no icterus ,no pallor Psychiatry: Judgement and insight appear normal. Mood & affect appropriate.     Data Reviewed: I have personally reviewed following labs and imaging studies  CBC: Recent Labs  Lab 07/05/17 1340 07/06/17 0328 07/07/17 0356  WBC 15.3* 19.9* 20.5*  NEUTROABS 12.5*  --   --   HGB 14.5 14.4 14.3  HCT 43.1 42.7 43.0  MCV 87.8 88.8 88.8  PLT 246 277 288   Basic Metabolic Panel: Recent Labs  Lab 07/05/17 1340 07/06/17 0328 07/07/17 0356  NA 137 135 136  K 4.1 4.2 4.4  CL 100* 97* 97*  CO2 27 27 29   GLUCOSE 247* 178* 212*  BUN 28* 25* 29*  CREATININE 1.05* 0.90 0.91  CALCIUM 9.1 9.0 9.0   GFR: Estimated Creatinine Clearance: 80.8 mL/min (by C-G formula based on SCr of 0.91 mg/dL). Liver Function Tests: No results for input(s): AST, ALT, ALKPHOS, BILITOT, PROT, ALBUMIN in the last 168 hours. No results for input(s): LIPASE, AMYLASE in the last 168 hours. No results for input(s): AMMONIA in the last 168 hours. Coagulation Profile: No results for input(s): INR, PROTIME in the last 168 hours. Cardiac Enzymes: No results for input(s): CKTOTAL, CKMB, CKMBINDEX, TROPONINI in the last 168 hours. BNP (last 3 results) No results for input(s): PROBNP in the last 8760 hours. HbA1C: Recent Labs    07/06/17 0328  HGBA1C 7.1*   CBG: Recent Labs  Lab 07/06/17 2000 07/07/17 0104 07/07/17 0600 07/07/17 0801 07/07/17 1235  GLUCAP 260* 264* 178* 148* 164*   Lipid Profile: No results for input(s): CHOL, HDL, LDLCALC, TRIG, CHOLHDL, LDLDIRECT in the last 72 hours. Thyroid Function Tests: No results for input(s): TSH, T4TOTAL, FREET4, T3FREE, THYROIDAB in the last 72 hours. Anemia Panel: No results for input(s): VITAMINB12, FOLATE, FERRITIN, TIBC, IRON, RETICCTPCT in the last 72 hours. Sepsis Labs: No results for input(s): PROCALCITON, LATICACIDVEN in the last 168 hours.  Recent Results (from the past 240 hour(s))  MRSA PCR  Screening     Status: None   Collection Time: 07/05/17  5:30 PM  Result Value Ref Range Status   MRSA by PCR NEGATIVE NEGATIVE Final    Comment:        The GeneXpert MRSA Assay (FDA approved for NASAL specimens only), is one component of a comprehensive MRSA colonization surveillance program. It is not intended to diagnose MRSA infection nor to guide or monitor treatment for MRSA infections. Performed at [facility], [address], [place], [place] [address]          Radiology Studies: No results found.      Scheduled Meds: . citalopram  20 mg Oral Daily  . doxycycline  100 mg Oral BID  . enoxaparin (LOVENOX) injection  60 mg Subcutaneous Q24H  . gabapentin  300 mg Oral BID  . guaiFENesin  600 mg Oral BID  . hydrochlorothiazide  25 mg Oral Daily  . insulin aspart  0-5 Units Subcutaneous QHS  . insulin aspart  0-9 Units Subcutaneous TID WC  .  ipratropium-albuterol  3 mL Nebulization Q6H  . nicotine  7 mg Transdermal Daily  . predniSONE  40 mg Oral Q breakfast  . sodium chloride flush  3 mL Intravenous Q12H   Continuous Infusions: . sodium chloride       LOS: 2 days    Time spent:     Meredith LeedsAmrit Cevin Rubinstein BK, MD [facility] Pager [phone]  If 7PM-7AM, please contact night-coverage [email or web address] Password [email or web address] 07/07/2017, 3:27 PM

## 2017-07-08 LAB — CBC WITH DIFFERENTIAL/PLATELET
Basophils Absolute: 0 10*3/uL (ref 0.0–0.1)
Basophils Relative: 0 %
Eosinophils Absolute: 0 10*3/uL (ref 0.0–0.7)
Eosinophils Relative: 0 %
HCT: 43.6 % (ref 36.0–46.0)
Hemoglobin: 14.4 g/dL (ref 12.0–15.0)
Lymphocytes Relative: 28 %
Lymphs Abs: 5.7 10*3/uL — ABNORMAL HIGH (ref 0.7–4.0)
MCH: 29.5 pg (ref 26.0–34.0)
MCHC: 33 g/dL (ref 30.0–36.0)
MCV: 89.3 fL (ref 78.0–100.0)
Monocytes Absolute: 1.1 10*3/uL — ABNORMAL HIGH (ref 0.1–1.0)
Monocytes Relative: 6 %
Neutro Abs: 13.5 10*3/uL — ABNORMAL HIGH (ref 1.7–7.7)
Neutrophils Relative %: 66 %
Platelets: 258 10*3/uL (ref 150–400)
RBC: 4.88 MIL/uL (ref 3.87–5.11)
RDW: 14.9 % (ref 11.5–15.5)
WBC: 20.3 10*3/uL — ABNORMAL HIGH (ref 4.0–10.5)

## 2017-07-08 LAB — GLUCOSE, CAPILLARY
Glucose-Capillary: 157 mg/dL — ABNORMAL HIGH (ref 65–99)
Glucose-Capillary: 231 mg/dL — ABNORMAL HIGH (ref 65–99)

## 2017-07-08 MED ORDER — IBUPROFEN 200 MG PO TABS: 400.0000 mg | ORAL_TABLET | Freq: Four times a day (QID) | ORAL | Status: DC | PRN

## 2017-07-08 MED ORDER — PREDNISONE 10 MG PO TABS: ORAL_TABLET | ORAL | 0 refills | Status: DC

## 2017-07-08 MED ORDER — IPRATROPIUM-ALBUTEROL 0.5-2.5 (3) MG/3ML IN SOLN
3.0000 mL | Freq: Three times a day (TID) | RESPIRATORY_TRACT | Status: DC
  Administered 2017-07-08: 3 mL via RESPIRATORY_TRACT
  Filled 2017-07-08: qty 3

## 2017-07-08 MED ORDER — DOXYCYCLINE HYCLATE 100 MG PO TABS: 100.0000 mg | ORAL_TABLET | Freq: Two times a day (BID) | ORAL | 0 refills | Status: DC

## 2017-07-08 NOTE — Progress Notes (Signed)
Home 02 ordered and desat screen done by nursing. [facility] alerted of order. Sandford Crazeora Brodi Nery RN,BSN,NCM [phone]

## 2017-07-08 NOTE — Discharge Summary (Signed)
Physician Discharge Summary  Brenda [name] XBM:[id number] DOB: [birth year] DOA: 07/05/2017  PCP: [name], MD  Admit date: 07/05/2017 Discharge date: 07/08/2017  Admitted From: Home Disposition: Home   Home Health:Yes Equipment/Devices:oxygen  Discharge Condition:Stable CODE STATUS:Full Diet recommendation: Heart Healthy   Brief/Interim Summary: Brenda Wiseman [name] a 57 y.o.femalewith medical history significant ofmorbid obesity, COPD, bipolar disorder, obstructive sleep apnea on CPAP comes in with over a week of worsening shortness of breath and wheezing. Patient was seen her primary care physician several times this week and had been started on doxycycline and prednisone taper. She did not gotten better. She denied any fevers or chills Patient noted to be in moderate respiratory distress on arrival was given an hour-long nebulizer treatment and feels somewhat better. She was not anywhere near her baseline respiratory status. She was on oxygen at night with her CPAP machine. She has not been able to use her CPAP machine for several days due to her upper respiratory symptoms. Patient is referred for admission for new oxygen requirement of 3 L nasal cannula and for COPD exacerbation and currently  she was being managed for that. Her respiratory status gradually improved.  She continued to need oxygen supplementation for maintenance of her saturation.  She qualified for home oxygen. Patient is being discharged to home with home oxygen.  Following problems were addressed during hospitalization:   Acute on chronic respiratory failure with hypoxia/COPD exacerbation: Still requires oxygen for saturation maintenance.  She qualified  for home oxygen. She was not on oxygen at home.   She follows with [facility] as an outpatient.   Started on doxycycline 100 mg twice daily.  Flu was negative on presentation.  Chest x-ray did not show any pneumonia. Steroids tapered to  oral.  Smoker: She smokes occasionally.  Currently was on nicotine patch.  Counseled for smoking cessation  Adjustment disorder with anxiety: On Ativan at home.  Continue for now  Chronic pain syndrome: Has chronic back pain.  Supportive care  Obstructive sleep apnea: Supposed to be on CPAP at home.  Patient had not been able to tolerate CPAP due to her current illness .  She will follow-up with her pulmonologist as an outpatient.  Type 2 diabetes mellitus:Hb A1C 7.1.  Will resume her home regimen on discharge  Leukocytosis: Most likely secondary to steroids.    Will recommend to check white cell counts by doing a CBC test in a week.    Discharge Diagnoses:  Principal Problem:   COPD exacerbation (HCC) Active Problems:   Obesity, morbid, BMI 50 or higher (HCC)   Leucocytosis   Adjustment disorder with anxiety   Chronic pain syndrome   OSA on CPAP   Type 2 diabetes, controlled, with peripheral neuropathy (HCC)   Acute on chronic respiratory failure with hypoxia [facility])    Discharge Instructions  Discharge Instructions    Diet - low sodium heart healthy   Complete by:  As directed    Discharge instructions   Complete by:  As directed    1) Take prescribed medications as instructed. 2) Follow up with your PCP in a week. 3) Follow up with your pulmonologist in 2 weeks. 4) Please stop smoking. 5) Do a CBC test in a week to check your white cell counts.   Increase activity slowly   Complete by:  As directed      Allergies as of 07/08/2017      Reactions   Metolazone Other (See Comments)   Metabolic  mood swings      Medication List    STOP taking these medications   cyanocobalamin 500 MCG tablet     TAKE these medications   ADVIL 200 MG tablet Generic drug:  ibuprofen Take 400 mg by mouth every 6 (six) hours as needed for headache.   benzonatate 200 MG capsule Commonly known as:  TESSALON Take 1 capsule (200 mg total) by mouth 3 (three) times daily as  needed for cough. Swallow whole, to not bite pill   BREO ELLIPTA 200-25 MCG/INH Aepb Generic drug:  fluticasone furoate-vilanterol INHALE 1 PUFF INTO THE LUNGS DAILY.   cetirizine 10 MG tablet Commonly known as:  ZYRTEC Take 10 mg by mouth daily.   citalopram 20 MG tablet Commonly known as:  CELEXA Take 1 tablet (20 mg total) daily by mouth.   doxycycline 100 MG tablet Commonly known as:  VIBRA-TABS Take 1 tablet (100 mg total) by mouth 2 (two) times daily.   fluticasone 50 MCG/ACT nasal spray Commonly known as:  FLONASE SPRAY TWICE INTO EACH NOSTRIL EVERY DAY   gabapentin 300 MG capsule Commonly known as:  NEURONTIN Take 1 capsule (300 mg total) by mouth 2 (two) times daily.   guaiFENesin 600 MG 12 hr tablet Commonly known as:  MUCINEX Take 1 tablet (600 mg total) by mouth 2 (two) times daily.   hydrochlorothiazide 25 MG tablet Commonly known as:  HYDRODIURIL Take 1 tablet (25 mg total) by mouth daily.   ipratropium-albuterol 0.5-2.5 (3) MG/3ML Soln Commonly known as:  DUONEB Take 3 mLs by nebulization every 4 (four) hours as needed (For wheezing or shortness of breath.).   LORazepam 0.5 MG tablet Commonly known as:  ATIVAN TAKE 1/2 TO 1 TABLET BY MOUTH TWICE A DAY AS NEEDED What changed:  See the new instructions.   metFORMIN 500 MG tablet Commonly known as:  GLUCOPHAGE TAKE 1 TABLET BY MOUTH EVERYDAY AT BEDTIME What changed:  See the new instructions.   multivitamin with minerals Tabs tablet Take 1 tablet by mouth daily.   nystatin 100000 UNIT/ML suspension Commonly known as:  MYCOSTATIN TAKE 1 TEASPOONFUL BY MOUTH 3 TIMES A DAY FOR 10 DAYS What changed:  See the new instructions.   predniSONE 10 MG tablet Commonly known as:  DELTASONE Take 4 pills daily for 3 days then 2 pills daily for 3 days then 1 pill daily for 3 days then stop. What changed:  additional instructions   PROAIR HFA 108 (90 Base) MCG/ACT inhaler Generic drug:  albuterol INHALE 2  PUFFS INTO THE LUNGS EVERY 6 (SIX) HOURS AS NEEDED FOR WHEEZING OR SHORTNESS OF BREATH.   albuterol (2.5 MG/3ML) 0.083% nebulizer solution Commonly known as:  PROVENTIL INHALE 1 VIAL VIA NEBULIZER EVERY 6 HOURS AS NEEDED FOR WHEEZING/SHORT BREATH   PROAIR RESPICLICK 108 (90 Base) MCG/ACT Aepb Generic drug:  Albuterol Sulfate INHALE 2 PUFFS INTO THE LUNGS EVERY 6 HOURS AS NEEDED FOR DYSPNEA/WHEEZE   promethazine-dextromethorphan 6.25-15 MG/5ML syrup Commonly known as:  PROMETHAZINE-DM Take 5 mLs by mouth 4 (four) times daily as needed for cough.   SPIRIVA HANDIHALER 18 MCG inhalation capsule Generic drug:  tiotropium PLACE 1 CAPSULE INTO INHALER AND INHALE DAILY.            Durable Medical Equipment  (From admission, onward)        Start     Ordered   07/08/17 1212  For home use only DME oxygen  Once    Question Answer Comment  Mode  or (Route) Nasal cannula   Liters per Minute 3   Oxygen delivery system Gas      07/08/17 1211      Allergies  Allergen Reactions  . Metolazone Other (See Comments)    Metabolic mood swings    Consultations: None  Procedures/Studies: Dg Chest 2 View  Result Date: 07/05/2017 CLINICAL DATA:  Productive cough and shortness of breath for the past week. History of asthma-COPD, former smoker. EXAM: CHEST  2 VIEW COMPARISON:  Portable chest x-ray of July 22, 2015 FINDINGS: The lungs are adequately inflated. There is no focal infiltrate. The interstitial markings are coarse bilaterally. There is no pleural effusion. The heart and pulmonary vascularity are normal. The mediastinum is normal in width. The trachea is midline. There is multilevel degenerative disc disease of the thoracic spine. IMPRESSION: Chronic bronchitic changes, stable.  No acute pneumonia nor CHF. Electronically Signed   By: David  SwazilandJordan M.D.   On: 07/05/2017 13:19        Subjective: Patient seen and examined the bedside this morning.  Remains comfortable.  Qualified  for home oxygen.  Discussed discharge planning and the patient agrees.   Discharge Exam: Vitals:   07/08/17 0503 07/08/17 0928  BP: 110/71   Pulse: 72 71  Resp: 10 16  Temp: 98.1 F (36.7 C)   SpO2: 96% 91%   Vitals:   07/07/17 2032 07/08/17 0119 07/08/17 0503 07/08/17 0928  BP:   110/71   Pulse:   72 71  Resp:   10 16  Temp:   98.1 F (36.7 C)   TempSrc:   Oral   SpO2: 96% 96% 96% 91%  Weight:   121.7 kg (268 lb 4.8 oz)   Height:        General: Pt is alert, awake, not in acute distress Cardiovascular: RRR, S1/S2 +, no rubs, no gallops Respiratory: Bilateral decreased air entry, bilateral occasional expiratory wheezes Abdominal: Soft, NT, ND, bowel sounds + Extremities: no edema, no cyanosis    The results of significant diagnostics from this hospitalization (including imaging, microbiology, ancillary and laboratory) are listed below for reference.     Microbiology: Recent Results (from the past 240 hour(s))  MRSA PCR Screening     Status: None   Collection Time: 07/05/17  5:30 PM  Result Value Ref Range Status   MRSA by PCR NEGATIVE NEGATIVE Final    Comment:        The GeneXpert MRSA Assay (FDA approved for NASAL specimens only), is one component of a comprehensive MRSA colonization surveillance program. It is not intended to diagnose MRSA infection nor to guide or monitor treatment for MRSA infections. Performed at [facility], [address] [place], [place] [address]      Labs: BNP (last 3 results) No results for input(s): BNP in the last 8760 hours. Basic Metabolic Panel: Recent Labs  Lab 07/05/17 1340 07/06/17 0328 07/07/17 0356  NA 137 135 136  K 4.1 4.2 4.4  CL 100* 97* 97*  CO2 27 27 29   GLUCOSE 247* 178* 212*  BUN 28* 25* 29*  CREATININE 1.05* 0.90 0.91  CALCIUM 9.1 9.0 9.0   Liver Function Tests: No results for input(s): AST, ALT, ALKPHOS, BILITOT, PROT, ALBUMIN in the last 168 hours. No results for  input(s): LIPASE, AMYLASE in the last 168 hours. No results for input(s): AMMONIA in the last 168 hours. CBC: Recent Labs  Lab 07/05/17 1340 07/06/17 0328 07/07/17 0356 07/08/17 0358  WBC 15.3*  19.9* 20.5* 20.3*  NEUTROABS 12.5*  --   --  13.5*  HGB 14.5 14.4 14.3 14.4  HCT 43.1 42.7 43.0 43.6  MCV 87.8 88.8 88.8 89.3  PLT 246 277 288 258   Cardiac Enzymes: No results for input(s): CKTOTAL, CKMB, CKMBINDEX, TROPONINI in the last 168 hours. BNP: Invalid input(s): POCBNP CBG: Recent Labs  Lab 07/07/17 1235 07/07/17 1722 07/07/17 2126 07/08/17 0743 07/08/17 1147  GLUCAP 164* 257* 249* 157* 231*   D-Dimer No results for input(s): DDIMER in the last 72 hours. Hgb A1c Recent Labs    07/06/17 0328  HGBA1C 7.1*   Lipid Profile No results for input(s): CHOL, HDL, LDLCALC, TRIG, CHOLHDL, LDLDIRECT in the last 72 hours. Thyroid function studies No results for input(s): TSH, T4TOTAL, T3FREE, THYROIDAB in the last 72 hours.  Invalid input(s): FREET3 Anemia work up No results for input(s): VITAMINB12, FOLATE, FERRITIN, TIBC, IRON, RETICCTPCT in the last 72 hours. Urinalysis    Component Value Date/Time   COLORURINE YELLOW 02/24/2010 0953   APPEARANCEUR CLEAR 02/24/2010 0953   LABSPEC 1.012 02/24/2010 0953   PHURINE 5.5 02/24/2010 0953   GLUCOSEU NEGATIVE 02/24/2010 0953   HGBUR NEGATIVE 02/24/2010 0953   HGBUR negative 02/22/2008 1545   BILIRUBINUR negative 01/12/2017 1150   KETONESUR NEGATIVE 02/24/2010 0953   PROTEINUR +- 01/12/2017 1150   PROTEINUR NEGATIVE 02/24/2010 0953   UROBILINOGEN 0.2 01/12/2017 1150   UROBILINOGEN 0.2 02/24/2010 0953   NITRITE negative 01/12/2017 1150   NITRITE NEGATIVE 02/24/2010 0953   LEUKOCYTESUR Small (1+) (A) 01/12/2017 1150   Sepsis Labs Invalid input(s): PROCALCITONIN,  WBC,  LACTICIDVEN Microbiology Recent Results (from the past 240 hour(s))  MRSA PCR Screening     Status: None   Collection Time: 07/05/17  5:30 PM  Result  Value Ref Range Status   MRSA by PCR NEGATIVE NEGATIVE Final    Comment:        The GeneXpert MRSA Assay (FDA approved for NASAL specimens only), is one component of a comprehensive MRSA colonization surveillance program. It is not intended to diagnose MRSA infection nor to guide or monitor treatment for MRSA infections. Performed at [facility], [address] [place], [place] [address]      Time coordinating discharge: Over 30 minutes  SIGNED:   Meredith Leeds, MD  [facility] 07/08/2017, 12:18 PM Pager [phone]  If 7PM-7AM, please contact night-coverage [email or web address] Password [email or web address]

## 2017-07-08 NOTE — Progress Notes (Signed)
Pt discharged home with family in stable condition. Discharge instructions given. Oxygen delivered to patient room. Scripts sent to pharmacy of choice. No immediate questions or concerns at this time. Pt discharged from unit via wheelchair.

## 2017-07-15 ENCOUNTER — Telehealth: Payer: Self-pay | Admitting: Family Medicine

## 2017-07-15 NOTE — Telephone Encounter (Signed)
Pt dropped off  [employer]/short term disability/group hospital indemnity claim form In dr g in box for review and signature Pt has appointment with you 2/19

## 2017-07-19 DIAGNOSIS — Z0279 Encounter for issue of other medical certificate: Secondary | ICD-10-CM

## 2017-07-19 NOTE — Telephone Encounter (Signed)
Forms filled and in Lisa's box ?

## 2017-07-19 NOTE — Telephone Encounter (Signed)
Spoke with pt notifying her the paperwork is ready to pick up.  Says she will pick up at [facility] tomorrow. [Placed paperwork at front office.]

## 2017-07-20 ENCOUNTER — Encounter: Payer: Self-pay | Admitting: Family Medicine

## 2017-07-20 ENCOUNTER — Ambulatory Visit: Payer: BLUE CROSS/BLUE SHIELD | Admitting: Family Medicine

## 2017-07-20 VITALS — BP 122/80 | HR 71 | Temp 98.6°F | Wt 275.5 lb

## 2017-07-20 DIAGNOSIS — G4733 Obstructive sleep apnea (adult) (pediatric): Secondary | ICD-10-CM

## 2017-07-20 DIAGNOSIS — D72829 Elevated white blood cell count, unspecified: Secondary | ICD-10-CM | POA: Diagnosis not present

## 2017-07-20 DIAGNOSIS — Z9989 Dependence on other enabling machines and devices: Secondary | ICD-10-CM

## 2017-07-20 DIAGNOSIS — F17200 Nicotine dependence, unspecified, uncomplicated: Secondary | ICD-10-CM | POA: Diagnosis not present

## 2017-07-20 DIAGNOSIS — J441 Chronic obstructive pulmonary disease with (acute) exacerbation: Secondary | ICD-10-CM | POA: Diagnosis not present

## 2017-07-20 DIAGNOSIS — J9621 Acute and chronic respiratory failure with hypoxia: Secondary | ICD-10-CM

## 2017-07-20 MED ORDER — IPRATROPIUM-ALBUTEROL 0.5-2.5 (3) MG/3ML IN SOLN: 3.0000 mL | RESPIRATORY_TRACT | 3 refills | Status: DC | PRN

## 2017-07-20 NOTE — Patient Instructions (Addendum)
Continue duonebs three times a day until you see lung doctor - refilled today.  Stay off cigarettes. Form for short term disability filled today.  Keep follow up with lung doctor. Continue oxygen for now.

## 2017-07-20 NOTE — Assessment & Plan Note (Addendum)
He states she quit as of this hospitalization. Encouraged fully avoid from now on.

## 2017-07-20 NOTE — Assessment & Plan Note (Deleted)
New oxygen requirement

## 2017-07-20 NOTE — Assessment & Plan Note (Signed)
Anticipate steroid related - discussed check CBC at next labwork.

## 2017-07-20 NOTE — Assessment & Plan Note (Signed)
Acute COPD exacerbation leading to recent hospitalization continues slowly improving. Advised schedule duonebs TID until evaluated by pulm. Reviewed preventative measures including complete smoking cessation.

## 2017-07-20 NOTE — Progress Notes (Signed)
BP 122/80 (BP Location: Left Arm, Patient Position: Sitting, Cuff Size: Large)   Pulse 71   Temp 98.6 F (37 C) (Oral)   Wt 275 lb 8 oz (125 kg)   LMP 03/08/2013   SpO2 97% Comment: 2L [place]  BMI 53.80 kg/m    CC: hosp f/u visit Subjective:    Patient ID: Brenda [name], female    DOB: March 15, 1961, 57 y.o.   MRN: [id number]  HPI: COTY STUDENT is a 57 y.o. female presenting on 07/20/2017 for Hospitalization Follow-up (Admitted to [facility] on 07/05/17, dx COPD exacerbation. Started on O2, 2 L.)   See prior notes for details. Failed outpatient management of COPD exacerbation. Hospitalized 2/4-11/2017. Treated with oxygen (new 3L requirement), regular nebulizer use. Planned f/u with pulm later this week. Completed doxycycline treatment as well as steroid taper. Quit smoking this admission - strongly encouraged to continue cessation. Found leukocytosis attributed to steroids.  She is feeling better since home, slowly. Staying tired and weak. Able to use CPAP. Ongoing L facial congestion and pressure. She is using nasal saline. Persistent productive cough, but it is improving some. No fevers/chills.   FMLA form filled out last week. Brings [employer] ST[employer] form to fill out today.   Admit date: 07/05/2017 Discharge date: 07/08/2017 [facility] f/u phone call not completed   Admitted From: Home Disposition: Home Home Health:Yes Equipment/Devices:oxygen Discharge Condition:Stable CODE STATUS:Full Diet recommendation: Heart Healthy   Discharge Diagnoses:  Principal Problem:   COPD exacerbation (HCC) Active Problems:   Obesity, morbid, BMI 50 or higher (HCC)   Leucocytosis   Adjustment disorder with anxiety   Chronic pain syndrome   OSA on CPAP   Type 2 diabetes, controlled, with peripheral neuropathy (HCC)   Acute on chronic respiratory failure with hypoxia (HCC)  Relevant past medical, surgical, family and social history reviewed and updated as indicated. Interim medical history since our last visit  reviewed. Allergies and medications reviewed and updated. Outpatient Medications Prior to Visit  Medication Sig Dispense Refill  . albuterol (PROVENTIL) (2.5 MG/3ML) 0.083% nebulizer solution INHALE 1 VIAL VIA NEBULIZER EVERY 6 HOURS AS NEEDED FOR WHEEZING/SHORT BREATH 150 mL 3  . benzonatate (TESSALON) 200 MG capsule Take 1 capsule (200 mg total) by mouth 3 (three) times daily as needed for cough. Swallow whole, to not bite pill 30 capsule 1  . BREO ELLIPTA 200-25 MCG/INH AEPB INHALE 1 PUFF INTO THE LUNGS DAILY. 60 each 3  . cetirizine (ZYRTEC) 10 MG tablet Take 10 mg by mouth daily.     . citalopram (CELEXA) 20 MG tablet Take 1 tablet (20 mg total) daily by mouth. 90 tablet 1  . fluticasone (FLONASE) 50 MCG/ACT nasal spray SPRAY TWICE INTO EACH NOSTRIL EVERY DAY 48 g 0  . gabapentin (NEURONTIN) 300 MG capsule Take 1 capsule (300 mg total) by mouth 2 (two) times daily. 180 capsule 3  . guaiFENesin (MUCINEX) 600 MG 12 hr tablet Take 1 tablet (600 mg total) by mouth 2 (two) times daily. 30 tablet 0  . hydrochlorothiazide (HYDRODIURIL) 25 MG tablet Take 1 tablet (25 mg total) by mouth daily. 90 tablet 3  . ibuprofen (ADVIL) 200 MG tablet Take 400 mg by mouth every 6 (six) hours as needed for headache.    Marland Kitchen LORazepam (ATIVAN) 0.5 MG tablet TAKE 1/2 TO 1 TABLET BY MOUTH TWICE A DAY AS NEEDED (Patient taking differently: TAKE 1/2 TO 1 TABLET BY MOUTH TWICE A DAY AS NEEDED FOR ANXIETY.) 30 tablet 0  .  metFORMIN (GLUCOPHAGE) 500 MG tablet TAKE 1 TABLET BY MOUTH EVERYDAY AT BEDTIME (Patient taking differently: TAKE 1 TABLET BY MOUTH DAILY) 90 tablet 1  . Multiple Vitamin (MULTIVITAMIN WITH MINERALS) TABS tablet Take 1 tablet by mouth daily.    Marland Kitchen nystatin (MYCOSTATIN) 100000 UNIT/ML suspension TAKE 1 TEASPOONFUL BY MOUTH 3 TIMES A DAY FOR 10 DAYS (Patient taking differently: TAKE 1 TEASPOONFUL BY MOUTH 3 TIMES DAILY AS NEEDED FOR MOUTH SORES.) 150 mL 0  . OXYGEN Inhale into the lungs. 2 L    . PROAIR HFA  108 (90 Base) MCG/ACT inhaler INHALE 2 PUFFS INTO THE LUNGS EVERY 6 (SIX) HOURS AS NEEDED FOR WHEEZING OR SHORTNESS OF BREATH. 8.5 Inhaler 2  . PROAIR RESPICLICK 108 (90 Base) MCG/ACT AEPB INHALE 2 PUFFS INTO THE LUNGS EVERY 6 HOURS AS NEEDED FOR DYSPNEA/WHEEZE 2 each 3  . promethazine-dextromethorphan (PROMETHAZINE-DM) 6.25-15 MG/5ML syrup Take 5 mLs by mouth 4 (four) times daily as needed for cough. 150 mL 0  . SPIRIVA HANDIHALER 18 MCG inhalation capsule PLACE 1 CAPSULE INTO INHALER AND INHALE DAILY. 30 capsule 8  . ipratropium-albuterol (DUONEB) 0.5-2.5 (3) MG/3ML SOLN Take 3 mLs by nebulization every 4 (four) hours as needed (For wheezing or shortness of breath.).    Marland Kitchen doxycycline (VIBRA-TABS) 100 MG tablet Take 1 tablet (100 mg total) by mouth 2 (two) times daily. 4 tablet 0  . predniSONE (DELTASONE) 10 MG tablet Take 4 pills daily for 3 days then 2 pills daily for 3 days then 1 pill daily for 3 days then stop. 21 tablet 0   No facility-administered medications prior to visit.      Per HPI unless specifically indicated in ROS section below Review of Systems     Objective:    BP 122/80 (BP Location: Left Arm, Patient Position: Sitting, Cuff Size: Large)   Pulse 71   Temp 98.6 F (37 C) (Oral)   Wt 275 lb 8 oz (125 kg)   LMP 03/08/2013   SpO2 97% Comment: 2L [place]  BMI 53.80 kg/m   Wt Readings from Last 3 Encounters:  07/20/17 275 lb 8 oz (125 kg)  07/08/17 268 lb 4.8 oz (121.7 kg)  07/05/17 270 lb 8 oz (122.7 kg)      Ht Readings from Last 3 Encounters:  07/06/17 5' (1.524 m)  06/29/17 5\' 1"  (1.549 m)  02/24/17 5\' 1"  (1.549 m)    Physical Exam  Constitutional: She appears well-developed and well-nourished. No distress.  HENT:  Head: Normocephalic and atraumatic.  Right Ear: Hearing, tympanic membrane, external ear and ear canal normal.  Left Ear: Hearing, external ear and ear canal normal.  Nose: Mucosal edema (nasal mucosal congestion/erythema) present. No rhinorrhea.    Mouth/Throat: Uvula is midline and mucous membranes are normal. No oropharyngeal exudate, posterior oropharyngeal edema, posterior oropharyngeal erythema or tonsillar abscesses.  Fluid behind L TM  Eyes: Conjunctivae and EOM are normal. Pupils are equal, round, and reactive to light. No scleral icterus.  Neck: Normal range of motion. Neck supple.  Cardiovascular: Normal rate, regular rhythm, normal heart sounds and intact distal pulses.  No murmur heard. Distant heart sounds  Pulmonary/Chest: Effort normal. No respiratory distress. She has wheezes (expiratory wheezing). She has rhonchi (bilateral). She has no rales.  Improved air movement from last visit, able to speak in complete sentences  Lymphadenopathy:    She has no cervical adenopathy.  Skin: Skin is warm and dry. No rash noted.  Nursing note and vitals reviewed.  Assessment & Plan:   Problem List Items Addressed This Visit    Acute on chronic respiratory failure with hypoxia (HCC)    New oxygen requirement, ongoing need noted. Pending eval by pulm.       COPD exacerbation (HCC) - Primary    Acute COPD exacerbation leading to recent hospitalization continues slowly improving. Advised schedule duonebs TID until evaluated by pulm. Reviewed preventative measures including complete smoking cessation.       Relevant Medications   ipratropium-albuterol (DUONEB) 0.5-2.5 (3) MG/3ML SOLN   Leucocytosis    Anticipate steroid related - discussed check CBC at next labwork.       OSA on CPAP    Continue CPAP      Smoker    He states she quit as of this hospitalization. Encouraged fully avoid from now on.          Meds ordered this encounter  Medications  . ipratropium-albuterol (DUONEB) 0.5-2.5 (3) MG/3ML SOLN    Sig: Take 3 mLs by nebulization every 4 (four) hours as needed (For wheezing or shortness of breath.).    Dispense:  225 mL    Refill:  3   No orders of the defined types were placed in this  encounter.   Follow up plan: No Follow-up on file.  Eustaquio BoydenJavier Tiara Maultsby, MD

## 2017-07-20 NOTE — Assessment & Plan Note (Signed)
New oxygen requirement, ongoing need noted. Pending eval by pulm.

## 2017-07-20 NOTE — Assessment & Plan Note (Signed)
Continue CPAP.  

## 2017-07-21 ENCOUNTER — Other Ambulatory Visit: Payer: Self-pay | Admitting: Family Medicine

## 2017-07-21 ENCOUNTER — Encounter: Payer: Self-pay | Admitting: Family Medicine

## 2017-07-21 NOTE — Telephone Encounter (Signed)
Last form signed and in Lisa's box.

## 2017-07-22 ENCOUNTER — Encounter: Payer: Self-pay | Admitting: Adult Health

## 2017-07-22 ENCOUNTER — Ambulatory Visit: Payer: BLUE CROSS/BLUE SHIELD | Admitting: Adult Health

## 2017-07-22 DIAGNOSIS — J4540 Moderate persistent asthma, uncomplicated: Secondary | ICD-10-CM

## 2017-07-22 DIAGNOSIS — G4733 Obstructive sleep apnea (adult) (pediatric): Secondary | ICD-10-CM

## 2017-07-22 DIAGNOSIS — J019 Acute sinusitis, unspecified: Secondary | ICD-10-CM | POA: Diagnosis not present

## 2017-07-22 DIAGNOSIS — Z9989 Dependence on other enabling machines and devices: Secondary | ICD-10-CM | POA: Diagnosis not present

## 2017-07-22 MED ORDER — AMOXICILLIN-POT CLAVULANATE 875-125 MG PO TABS: 1.0000 | ORAL_TABLET | Freq: Two times a day (BID) | ORAL | 0 refills | Status: AC

## 2017-07-22 MED ORDER — PREDNISONE 10 MG PO TABS: ORAL_TABLET | ORAL | 0 refills | Status: DC

## 2017-07-22 NOTE — Patient Instructions (Addendum)
Augmentin 875mg  Twice daily  For 10 days , take w/ food.  Prednisone taper over next week.  Mucinex DM Twice daily  As needed  Cough /congestion  Use oxygen 2l/m with activity and At bedtime   Restart CPAP  At bedtime   Work on not smoking .  Continue on BREO daily , rinse after use.  Continue Duoneb Three times a day  .  Follow up with with Dr. Sherene SiresWert  In 2 weeks and As needed   Please contact office for sooner follow up if symptoms do not improve or worsen or seek emergency care

## 2017-07-22 NOTE — Assessment & Plan Note (Signed)
Needs to restart CPAP at bedtime weight loss  Plan  . Patient Instructions  Augmentin 875mg  Twice daily  For 10 days , take w/ food.  Prednisone taper over next week.  Mucinex DM Twice daily  As needed  Cough /congestion  Use oxygen 2l/m with activity and At bedtime   Restart CPAP  At bedtime   Work on not smoking .  Continue on BREO daily , rinse after use.  Continue Duoneb Three times a day  .  Follow up with with Dr. Sherene SiresWert  In 2 weeks and As needed   Please contact office for sooner follow up if symptoms do not improve or worsen or seek emergency care

## 2017-07-22 NOTE — Progress Notes (Signed)
@Patient  ID: Brenda [name], female    DOB: [birth year], 57 y.o.   MRN: [id number]  Chief Complaint  Patient presents with  . Follow-up    COPD     Referring provider: [name], MD  HPI: 57 year old female former smoker, morbidly obese, followed for COPD and obstructive sleep apnea  Tests PFT 07/29/06 >> FEV1 1.75 (73%), FEV1% 60, TLC 4.81 (105%), DLCO 55%, + BD HST 12/08/14 >> AHI 58.6, SaO2 low 64%. A1AT 03/25/15 >> 176, MM Auto CPAP 10/02/15 to 10/31/15 >> used on 30 of 30 nights with average 7 hrs 19 min.  Average AHI 1 with median CPAP 9 and 95 th percentile CPAP 13 cm H2O PFT 11/04/14 >> FEV1 1.36 (57%), FEV1% 63, TLC 4.12 (89%), DLCO 93%, +BD  07/22/2017 Follow up ; COPD ,OSA  Patient presents for a follow-up.  Patient was last seen in the office in 2017. She was recently admitted earlier this month for a COPD exacerbation.  She was treated with IV antibiotics steroids and nebulized bronchodilators. Patient says she was feeling better for short period of time however she continues to have cough congestion wheezing and significant sinus pain and pressure with ear discomfort. She has restarted smoking. She is on Breo daily  and DuoNeb nebulizer 3 times daily. She is supposed to be on CPAP at bedtime.  But says she has not worn it for a couple weeks.  We discussed CPAP compliance and importance of controlling her sleep apnea.  Allergies  Allergen Reactions  . Metolazone Other (See Comments)    Metabolic mood swings    Immunization History  Administered Date(s) Administered  . Influenza Whole 04/01/2002  . Influenza,inj,Quad PF,6+ Mos 03/21/2015  . Influenza-Unspecified 03/01/2014, 04/22/2016, 03/25/2017  . Pneumococcal Polysaccharide-23 04/01/2002, 07/24/2015  . Td 06/01/1993  . Tdap 02/24/2017    Past Medical History:  Diagnosis Date  . Acute pancreatitis   . Asthma    main dyspnea thought due to deconditioning (10/2013)  . Bipolar I disorder, most recent  episode (or current) manic, unspecified    pt denies this  . Bronchitis, chronic obstructive (HCC) 2008 & 2009   FeV1 64% TLC 105% DLCO 55% 2008  -  FeV1 81% FeF 25-75 43% 2009  . COPD (chronic obstructive pulmonary disease) (HCC)    emphysema, not an issue per pulm (10/2013)  . History of pyelonephritis 05/2012  . HTN (hypertension)   . Hx of migraines   . Knee pain, right   . Leukocytosis, unspecified   . Lumbar disc disease with radiculopathy    multilevel spondylitic changes, scoliosis, and anterolisthesis L4 not thought to currently be surg candidate (Dr. Phoebe Perch, [facility]) (Dr. Ollen Bowl, [place])  . Nicotine addiction   . Nonspecific abnormal electrocardiogram (ECG) (EKG)   . Obesity   . Pure hyperglyceridemia   . Suicide attempt (HCC) 06/2001  . Swelling of limb   . Urge and stress incontinence 07/2012   (MacDiarmid)    Tobacco History: Social History   Tobacco Use  Smoking Status Former Smoker  . Years: 30.00  . Types: Cigarettes  . Last attempt to quit: 06/09/2015  . Years since quitting: 2.1  Smokeless Tobacco Never Used  Tobacco Comment   4 cig a day   Counseling given: Not Answered Comment: 4 cig a day   Outpatient Encounter Medications as of 07/22/2017  Medication Sig  . albuterol (PROVENTIL) (2.5 MG/3ML) 0.083% nebulizer solution INHALE 1 VIAL VIA NEBULIZER EVERY 6 HOURS AS NEEDED FOR WHEEZING/SHORT  BREATH  . benzonatate (TESSALON) 200 MG capsule Take 1 capsule (200 mg total) by mouth 3 (three) times daily as needed for cough. Swallow whole, to not bite pill  . BREO ELLIPTA 200-25 MCG/INH AEPB INHALE 1 PUFF INTO THE LUNGS DAILY.  . cetirizine (ZYRTEC) 10 MG tablet Take 10 mg by mouth daily.   . citalopram (CELEXA) 20 MG tablet Take 1 tablet (20 mg total) daily by mouth.  . fluticasone (FLONASE) 50 MCG/ACT nasal spray SPRAY TWICE INTO EACH NOSTRIL EVERY DAY  . gabapentin (NEURONTIN) 300 MG capsule Take 1 capsule (300 mg total) by mouth 2 (two) times daily.  Marland Kitchen.  guaiFENesin (MUCINEX) 600 MG 12 hr tablet Take 1 tablet (600 mg total) by mouth 2 (two) times daily.  . hydrochlorothiazide (HYDRODIURIL) 25 MG tablet Take 1 tablet (25 mg total) by mouth daily.  Marland Kitchen. ibuprofen (ADVIL) 200 MG tablet Take 400 mg by mouth every 6 (six) hours as needed for headache.  . ipratropium-albuterol (DUONEB) 0.5-2.5 (3) MG/3ML SOLN Take 3 mLs by nebulization every 4 (four) hours as needed (For wheezing or shortness of breath.).  Marland Kitchen. LORazepam (ATIVAN) 0.5 MG tablet TAKE 1/2 TO 1 TABLET BY MOUTH TWICE A DAY AS NEEDED (Patient taking differently: TAKE 1/2 TO 1 TABLET BY MOUTH TWICE A DAY AS NEEDED FOR ANXIETY.)  . metFORMIN (GLUCOPHAGE) 500 MG tablet TAKE 1 TABLET BY MOUTH EVERYDAY AT BEDTIME  . Multiple Vitamin (MULTIVITAMIN WITH MINERALS) TABS tablet Take 1 tablet by mouth daily.  Marland Kitchen. nystatin (MYCOSTATIN) 100000 UNIT/ML suspension TAKE 1 TEASPOONFUL BY MOUTH 3 TIMES A DAY FOR 10 DAYS (Patient taking differently: TAKE 1 TEASPOONFUL BY MOUTH 3 TIMES DAILY AS NEEDED FOR MOUTH SORES.)  . OXYGEN Inhale into the lungs. 2 L  . PROAIR HFA 108 (90 Base) MCG/ACT inhaler INHALE 2 PUFFS INTO THE LUNGS EVERY 6 (SIX) HOURS AS NEEDED FOR WHEEZING OR SHORTNESS OF BREATH.  Marland Kitchen. PROAIR RESPICLICK 108 (90 Base) MCG/ACT AEPB INHALE 2 PUFFS INTO THE LUNGS EVERY 6 HOURS AS NEEDED FOR DYSPNEA/WHEEZE  . promethazine-dextromethorphan (PROMETHAZINE-DM) 6.25-15 MG/5ML syrup Take 5 mLs by mouth 4 (four) times daily as needed for cough.  . SPIRIVA HANDIHALER 18 MCG inhalation capsule PLACE 1 CAPSULE INTO INHALER AND INHALE DAILY.  Marland Kitchen. amoxicillin-clavulanate (AUGMENTIN) 875-125 MG tablet Take 1 tablet by mouth 2 (two) times daily for 7 days.  . predniSONE (DELTASONE) 10 MG tablet 4 tabs for 2 days, then 3 tabs for 2 days, 2 tabs for 2 days, then 1 tab for 2 days, then stop   No facility-administered encounter medications on file as of 07/22/2017.      Review of Systems  Constitutional:   No  weight loss, night  sweats,  Fevers, chills,  +fatigue, or  lassitude.  HEENT:   No headaches,  Difficulty swallowing,  Tooth/dental problems, or  Sore throat,                No sneezing, itching,  +ear ache, nasal congestion, post nasal drip,   CV:  No chest pain,  Orthopnea, PND, swelling in lower extremities, anasarca, dizziness, palpitations, syncope.   GI  No heartburn, indigestion, abdominal pain, nausea, vomiting, diarrhea, change in bowel habits, loss of appetite, bloody stools.   Resp:   No chest wall deformity  Skin: no rash or lesions.  GU: no dysuria, change in color of urine, no urgency or frequency.  No flank pain, no hematuria   MS:  No joint pain or swelling.  No  decreased range of motion.  No back pain.    Physical Exam  BP 116/78 (BP Location: Left Arm, Cuff Size: Normal)   Pulse 80   Ht 5\' 1"  (1.549 m)   Wt 277 lb (125.6 kg)   LMP 03/08/2013   SpO2 95%   BMI 52.34 kg/m   GEN: A/Ox3; pleasant , NAD, morbidly obese   HEENT:  [place]/AT,  EACs-clear, TMs-mild redness, NOSE-clear, THROAT-clear, no lesions, no postnasal drip or exudate noted.  Class III MP airway   NECK:  Supple w/ fair ROM; no JVD; normal carotid impulses w/o bruits; no thyromegaly or nodules palpated; no lymphadenopathy.    RESP  Clear  P & A; w/o, wheezes/ rales/ or rhonchi. no accessory muscle use, no dullness to percussion  CARD:  RRR, no m/r/g,tr peripheral edema, pulses intact, no cyanosis or clubbing.  GI:   Soft & nt; nml bowel sounds; no organomegaly or masses detected.   Musco: Warm bil, no deformities or joint swelling noted.   Neuro: alert, no focal deficits noted.    Skin: Warm, no lesions or rashes    Lab Results:  CBC  BMET   Imaging: Dg Chest 2 View  Result Date: 07/05/2017 CLINICAL DATA:  Productive cough and shortness of breath for the past week. History of asthma-COPD, former smoker. EXAM: CHEST  2 VIEW COMPARISON:  Portable chest x-ray of July 22, 2015 FINDINGS: The lungs are  adequately inflated. There is no focal infiltrate. The interstitial markings are coarse bilaterally. There is no pleural effusion. The heart and pulmonary vascularity are normal. The mediastinum is normal in width. The trachea is midline. There is multilevel degenerative disc disease of the thoracic spine. IMPRESSION: Chronic bronchitic changes, stable.  No acute pneumonia nor CHF. Electronically Signed   By: David  [place] M.D.   On: 07/05/2017 13:19     Assessment & Plan:   Asthmatic bronchitis Slow to resolve exacerbation with associated sinusitis and Left OM   Plan  Patient Instructions  Augmentin 875mg  Twice daily  For 10 days , take w/ food.  Prednisone taper over next week.  Mucinex DM Twice daily  As needed  Cough /congestion  Use oxygen 2l/m with activity and At bedtime   Restart CPAP  At bedtime   Work on not smoking .  Continue on BREO daily , rinse after use.  Continue Duoneb Three times a day  .  Follow up with with Dr. Sherene Sires  In 2 weeks and As needed   Please contact office for sooner follow up if symptoms do not improve or worsen or seek emergency care          Acute sinus infection Treat with Augmentin times 10 days  OSA on CPAP Needs to restart CPAP at bedtime weight loss  Plan  . Patient Instructions  Augmentin 875mg  Twice daily  For 10 days , take w/ food.  Prednisone taper over next week.  Mucinex DM Twice daily  As needed  Cough /congestion  Use oxygen 2l/m with activity and At bedtime   Restart CPAP  At bedtime   Work on not smoking .  Continue on BREO daily , rinse after use.  Continue Duoneb Three times a day  .  Follow up with with Dr. Sherene Sires  In 2 weeks and As needed   Please contact office for sooner follow up if symptoms do not improve or worsen or seek emergency care  Rubye Oaks, NP 07/22/2017

## 2017-07-22 NOTE — Assessment & Plan Note (Signed)
Slow to resolve exacerbation with associated sinusitis and Left OM   Plan  Patient Instructions  Augmentin 875mg  Twice daily  For 10 days , take w/ food.  Prednisone taper over next week.  Mucinex DM Twice daily  As needed  Cough /congestion  Use oxygen 2l/m with activity and At bedtime   Restart CPAP  At bedtime   Work on not smoking .  Continue on BREO daily , rinse after use.  Continue Duoneb Three times a day  .  Follow up with with Dr. Sherene SiresWert  In 2 weeks and As needed   Please contact office for sooner follow up if symptoms do not improve or worsen or seek emergency care

## 2017-07-22 NOTE — Assessment & Plan Note (Signed)
Treat with Augmentin times 10 days

## 2017-07-23 NOTE — Progress Notes (Signed)
Chart and office note reviewed in detail  > agree with a/p as outlined    

## 2017-07-28 ENCOUNTER — Telehealth: Payer: Self-pay | Admitting: Family Medicine

## 2017-07-28 NOTE — Telephone Encounter (Signed)
[employer] faxed paperwork In dr g in box for review and signature

## 2017-07-29 NOTE — Telephone Encounter (Signed)
Filled and placed in my out box. 

## 2017-07-29 NOTE — Telephone Encounter (Signed)
Spoke with pt she is aware paperwork has been faxed Copy for pt Copy for scan

## 2017-08-04 ENCOUNTER — Encounter: Payer: Self-pay | Admitting: Adult Health

## 2017-08-04 ENCOUNTER — Other Ambulatory Visit (INDEPENDENT_AMBULATORY_CARE_PROVIDER_SITE_OTHER): Payer: BLUE CROSS/BLUE SHIELD

## 2017-08-04 ENCOUNTER — Ambulatory Visit: Payer: BLUE CROSS/BLUE SHIELD | Admitting: Adult Health

## 2017-08-04 DIAGNOSIS — R609 Edema, unspecified: Secondary | ICD-10-CM | POA: Insufficient documentation

## 2017-08-04 DIAGNOSIS — G4733 Obstructive sleep apnea (adult) (pediatric): Secondary | ICD-10-CM | POA: Diagnosis not present

## 2017-08-04 DIAGNOSIS — Z9989 Dependence on other enabling machines and devices: Secondary | ICD-10-CM

## 2017-08-04 DIAGNOSIS — R600 Localized edema: Secondary | ICD-10-CM

## 2017-08-04 DIAGNOSIS — J4540 Moderate persistent asthma, uncomplicated: Secondary | ICD-10-CM | POA: Diagnosis not present

## 2017-08-04 DIAGNOSIS — J9611 Chronic respiratory failure with hypoxia: Secondary | ICD-10-CM | POA: Diagnosis not present

## 2017-08-04 LAB — CBC WITH DIFFERENTIAL/PLATELET
Basophils Absolute: 0.1 10*3/uL (ref 0.0–0.1)
Basophils Relative: 0.6 % (ref 0.0–3.0)
Eosinophils Absolute: 0.6 10*3/uL (ref 0.0–0.7)
Eosinophils Relative: 4.2 % (ref 0.0–5.0)
HCT: 40.9 % (ref 36.0–46.0)
Hemoglobin: 13.6 g/dL (ref 12.0–15.0)
Lymphocytes Relative: 20.3 % (ref 12.0–46.0)
Lymphs Abs: 3.1 10*3/uL (ref 0.7–4.0)
MCHC: 33.3 g/dL (ref 30.0–36.0)
MCV: 88 fl (ref 78.0–100.0)
Monocytes Absolute: 0.8 10*3/uL (ref 0.1–1.0)
Monocytes Relative: 5.4 % (ref 3.0–12.0)
Neutro Abs: 10.5 10*3/uL — ABNORMAL HIGH (ref 1.4–7.7)
Neutrophils Relative %: 69.5 % (ref 43.0–77.0)
Platelets: 280 10*3/uL (ref 150.0–400.0)
RBC: 4.64 Mil/uL (ref 3.87–5.11)
RDW: 15.6 % — ABNORMAL HIGH (ref 11.5–15.5)
WBC: 15 10*3/uL — ABNORMAL HIGH (ref 4.0–10.5)

## 2017-08-04 LAB — BASIC METABOLIC PANEL
BUN: 10 mg/dL (ref 6–23)
CO2: 31 mEq/L (ref 19–32)
Calcium: 10.3 mg/dL (ref 8.4–10.5)
Chloride: 98 mEq/L (ref 96–112)
Creatinine, Ser: 0.88 mg/dL (ref 0.40–1.20)
GFR: 69.89 mL/min (ref >60.00)
Glucose, Bld: 123 mg/dL — ABNORMAL HIGH (ref 70–99)
Potassium: 3.8 mEq/L (ref 3.5–5.1)
Sodium: 138 mEq/L (ref 135–145)

## 2017-08-04 LAB — BRAIN NATRIURETIC PEPTIDE: Pro B Natriuretic peptide (BNP): 17 pg/mL (ref 0.0–100.0)

## 2017-08-04 MED ORDER — FUROSEMIDE 20 MG PO TABS: ORAL_TABLET | ORAL | 1 refills | Status: DC

## 2017-08-04 NOTE — Progress Notes (Signed)
@Patient  ID: Brenda [name], female    DOB: [birth year], 57 y.o.   MRN: [id number]  Chief Complaint  Patient presents with  . Follow-up    COPD     Referring provider: [name], MD  HPI: 57 year old female active smoker, morbidly obese, followed for COPD and obstructive sleep apnea  Tests PFT 07/29/06 >> FEV1 1.75 (73%), FEV1% 60, TLC 4.81 (105%), DLCO 55%, + BD HST 12/08/14 >> AHI 58.6, SaO2 low 64%. A1AT 03/25/15 >> 176, MM Auto CPAP 10/02/15 to 10/31/15 >>used on 30 of 30 nights with average 7 hrs 19 min. Average AHI 1 with median CPAP 9 and 95 th percentile CPAP 13 cm H2O PFT 11/04/14 >> FEV1 1.36 (57%), FEV1% 63, TLC 4.12 (89%), DLCO 93%, +BD  08/04/2017 Follow up : COPD , OSA  Patient presents for 2-week follow-up.  Patient was seen last visit after a recent hospitalization for a COPD exacerbation.  Patient was continued to have persistent cough and wheezing.  She was given a 10-day course of Augmentin and a prednisone taper for sinusitis and bronchitis . Marland Kitchen  Patient continues to smoke.  She says that she is doing some better.  She continues to have significant shortness of breath with minimal activity.  Wt continues to trend up , has more ankle edema.  She remains on BREO daily and Duoneb Three times a day  .   She has underlying sleep apnea.  She was recommended to restart her CPAP.  Patient has try to wear it for a few nights.  Since her last visit.  Download shows 20% compliance with a average usage on the days she used it at 5 hours.  AHI 1.7.discussed compliance   She is supposed to be on oxygen 2 L with activity however she only uses this occasionally . Does not have it with her today . Says it is in the car.  O2 sats on arrival were 92%.  At rest O2 saturation 95%.      Allergies  Allergen Reactions  . Metolazone Other (See Comments)    Metabolic mood swings    Immunization History  Administered Date(s) Administered  . Influenza Whole 04/01/2002  .  Influenza,inj,Quad PF,6+ Mos 03/21/2015  . Influenza-Unspecified 03/01/2014, 04/22/2016, 03/25/2017  . Pneumococcal Polysaccharide-23 04/01/2002, 07/24/2015  . Td 06/01/1993  . Tdap 02/24/2017    Past Medical History:  Diagnosis Date  . Acute pancreatitis   . Asthma    main dyspnea thought due to deconditioning (10/2013)  . Bipolar I disorder, most recent episode (or current) manic, unspecified    pt denies this  . Bronchitis, chronic obstructive (HCC) 2008 & 2009   FeV1 64% TLC 105% DLCO 55% 2008  -  FeV1 81% FeF 25-75 43% 2009  . COPD (chronic obstructive pulmonary disease) (HCC)    emphysema, not an issue per pulm (10/2013)  . History of pyelonephritis 05/2012  . HTN (hypertension)   . Hx of migraines   . Knee pain, right   . Leukocytosis, unspecified   . Lumbar disc disease with radiculopathy    multilevel spondylitic changes, scoliosis, and anterolisthesis L4 not thought to currently be surg candidate (Dr. Phoebe Perch, [facility]) (Dr. Ollen Bowl, [place])  . Nicotine addiction   . Nonspecific abnormal electrocardiogram (ECG) (EKG)   . Obesity   . Pure hyperglyceridemia   . Suicide attempt (HCC) 06/2001  . Swelling of limb   . Urge and stress incontinence 07/2012   (MacDiarmid)    Tobacco History: Social  History   Tobacco Use  Smoking Status Former Smoker  . Years: 30.00  . Types: Cigarettes  . Last attempt to quit: 06/09/2015  . Years since quitting: 2.1  Smokeless Tobacco Never Used  Tobacco Comment   4 cig a day   Counseling given: Not Answered Comment: 4 cig a day   Outpatient Encounter Medications as of 08/04/2017  Medication Sig  . albuterol (PROVENTIL) (2.5 MG/3ML) 0.083% nebulizer solution INHALE 1 VIAL VIA NEBULIZER EVERY 6 HOURS AS NEEDED FOR WHEEZING/SHORT BREATH  . benzonatate (TESSALON) 200 MG capsule Take 1 capsule (200 mg total) by mouth 3 (three) times daily as needed for cough. Swallow whole, to not bite pill  . BREO ELLIPTA 200-25 MCG/INH AEPB INHALE 1 PUFF  INTO THE LUNGS DAILY.  . cetirizine (ZYRTEC) 10 MG tablet Take 10 mg by mouth daily.   . citalopram (CELEXA) 20 MG tablet Take 1 tablet (20 mg total) daily by mouth.  . fluticasone (FLONASE) 50 MCG/ACT nasal spray SPRAY TWICE INTO EACH NOSTRIL EVERY DAY  . gabapentin (NEURONTIN) 300 MG capsule Take 1 capsule (300 mg total) by mouth 2 (two) times daily.  Marland Kitchen. guaiFENesin (MUCINEX) 600 MG 12 hr tablet Take 1 tablet (600 mg total) by mouth 2 (two) times daily.  . hydrochlorothiazide (HYDRODIURIL) 25 MG tablet Take 1 tablet (25 mg total) by mouth daily.  Marland Kitchen. ibuprofen (ADVIL) 200 MG tablet Take 400 mg by mouth every 6 (six) hours as needed for headache.  . ipratropium-albuterol (DUONEB) 0.5-2.5 (3) MG/3ML SOLN Take 3 mLs by nebulization every 4 (four) hours as needed (For wheezing or shortness of breath.).  Marland Kitchen. LORazepam (ATIVAN) 0.5 MG tablet TAKE 1/2 TO 1 TABLET BY MOUTH TWICE A DAY AS NEEDED (Patient taking differently: TAKE 1/2 TO 1 TABLET BY MOUTH TWICE A DAY AS NEEDED FOR ANXIETY.)  . metFORMIN (GLUCOPHAGE) 500 MG tablet TAKE 1 TABLET BY MOUTH EVERYDAY AT BEDTIME  . Multiple Vitamin (MULTIVITAMIN WITH MINERALS) TABS tablet Take 1 tablet by mouth daily.  Marland Kitchen. nystatin (MYCOSTATIN) 100000 UNIT/ML suspension TAKE 1 TEASPOONFUL BY MOUTH 3 TIMES A DAY FOR 10 DAYS (Patient taking differently: TAKE 1 TEASPOONFUL BY MOUTH 3 TIMES DAILY AS NEEDED FOR MOUTH SORES.)  . OXYGEN Inhale into the lungs. 2 L  . PROAIR HFA 108 (90 Base) MCG/ACT inhaler INHALE 2 PUFFS INTO THE LUNGS EVERY 6 (SIX) HOURS AS NEEDED FOR WHEEZING OR SHORTNESS OF BREATH.  Marland Kitchen. PROAIR RESPICLICK 108 (90 Base) MCG/ACT AEPB INHALE 2 PUFFS INTO THE LUNGS EVERY 6 HOURS AS NEEDED FOR DYSPNEA/WHEEZE  . promethazine-dextromethorphan (PROMETHAZINE-DM) 6.25-15 MG/5ML syrup Take 5 mLs by mouth 4 (four) times daily as needed for cough.  . SPIRIVA HANDIHALER 18 MCG inhalation capsule PLACE 1 CAPSULE INTO INHALER AND INHALE DAILY.  . [DISCONTINUED] predniSONE  (DELTASONE) 10 MG tablet 4 tabs for 2 days, then 3 tabs for 2 days, 2 tabs for 2 days, then 1 tab for 2 days, then stop (Patient not taking: Reported on 08/04/2017)   No facility-administered encounter medications on file as of 08/04/2017.      Review of Systems  Constitutional:   No  weight loss, night sweats,  Fevers, chills,  +fatigue, or  lassitude.  HEENT:   No headaches,  Difficulty swallowing,  Tooth/dental problems, or  Sore throat,                No sneezing, itching, ear ache, nasal congestion, post nasal drip,   CV:  No chest pain,  Orthopnea,  PND, swelling in lower extremities, anasarca, dizziness, palpitations, syncope.   GI  No heartburn, indigestion, abdominal pain, nausea, vomiting, diarrhea, change in bowel habits, loss of appetite, bloody stools.   Resp:   No chest wall deformity  Skin: no rash or lesions.  GU: no dysuria, change in color of urine, no urgency or frequency.  No flank pain, no hematuria   MS:  No joint pain or swelling.  No decreased range of motion.  No back pain.    Physical Exam  BP 114/72 (BP Location: Left Arm, Cuff Size: Large)   Pulse 87   Ht 5\' 1"  (1.549 m)   Wt 280 lb 3.2 oz (127.1 kg)   LMP 03/08/2013   SpO2 95%   BMI 52.94 kg/m   GEN: A/Ox3; pleasant , NAD, obese    HEENT:  [place]/AT,  EACs-clear, TMs-wnl, NOSE-clear, THROAT-clear, no lesions, no postnasal drip or exudate noted.   NECK:  Supple w/ fair ROM; no JVD; normal carotid impulses w/o bruits; no thyromegaly or nodules palpated; no lymphadenopathy.    RESP  Few trace wheezes, speaks in full sentences , . no accessory muscle use, no dullness to percussion  CARD:  RRR, no m/r/g, 1-2+ peripheral edema, pulses intact, no cyanosis or clubbing.  GI:   Soft & nt; nml bowel sounds; no organomegaly or masses detected.   Musco: Warm bil, no deformities or joint swelling noted.   Neuro: alert, no focal deficits noted.    Skin: Warm, no lesions or rashes    Lab  Results:  CBC    Component Value Date/Time   WBC 20.3 (H) 07/08/2017 0358   RBC 4.88 07/08/2017 0358   HGB 14.4 07/08/2017 0358   HCT 43.6 07/08/2017 0358   PLT 258 07/08/2017 0358   MCV 89.3 07/08/2017 0358   MCH 29.5 07/08/2017 0358   MCHC 33.0 07/08/2017 0358   RDW 14.9 07/08/2017 0358   LYMPHSABS 5.7 (H) 07/08/2017 0358   MONOABS 1.1 (H) 07/08/2017 0358   EOSABS 0.0 07/08/2017 0358   BASOSABS 0.0 07/08/2017 0358    BMET    Component Value Date/Time   NA 136 07/07/2017 0356   K 4.4 07/07/2017 0356   CL 97 (L) 07/07/2017 0356   CO2 29 07/07/2017 0356   GLUCOSE 212 (H) 07/07/2017 0356   BUN 29 (H) 07/07/2017 0356   CREATININE 0.91 07/07/2017 0356   CALCIUM 9.0 07/07/2017 0356   GFRNONAA >60 07/07/2017 0356   GFRAA >60 07/07/2017 0356    BNP    Component Value Date/Time   BNP 15.4 07/20/2015 1412    ProBNP    Component Value Date/Time   PROBNP 22.0 04/01/2015 1645    Imaging: Dg Chest 2 View  Result Date: 07/05/2017 CLINICAL DATA:  Productive cough and shortness of breath for the past week. History of asthma-COPD, former smoker. EXAM: CHEST  2 VIEW COMPARISON:  Portable chest x-ray of July 22, 2015 FINDINGS: The lungs are adequately inflated. There is no focal infiltrate. The interstitial markings are coarse bilaterally. There is no pleural effusion. The heart and pulmonary vascularity are normal. The mediastinum is normal in width. The trachea is midline. There is multilevel degenerative disc disease of the thoracic spine. IMPRESSION: Chronic bronchitic changes, stable.  No acute pneumonia nor CHF. Electronically Signed   By: David  [place] M.D.   On: 07/05/2017 13:19     Assessment & Plan:   Asthmatic bronchitis REsolving flare - smoking cessation discussed   Plan  Patient  Instructions  Stop Hydrochlorothiazide .  Begin Lasix 20mg  daily -2 daily for 3 days then one daily  Labs today  Mucinex DM Twice daily  As needed  Cough /congestion  Use oxygen  2l/m with activity and At bedtime   Try wear the CPAP  At bedtime  At least 4 hr each night.  Work on not smoking .  Continue on BREO daily , rinse after use.  Continue Duoneb Three times a day  .  Follow up with with Dr. Sherene Sires  In 2 weeks and As needed   Please contact office for sooner follow up if symptoms do not improve or worsen or seek emergency care          Chronic respiratory failure with hypoxia (HCC) Use oxygen 2l/m with activity .    OSA on CPAP Use CPAP At bedtime  For at least 4hr each night  Pt education given .   Edema LE edema ? Diastolic dysfunction  Check labs with BNP  Change HCTZ to lasix   Plan  . Patient Instructions  Stop Hydrochlorothiazide .  Begin Lasix 20mg  daily -2 daily for 3 days then one daily  Labs today  Mucinex DM Twice daily  As needed  Cough /congestion  Use oxygen 2l/m with activity and At bedtime   Try wear the CPAP  At bedtime  At least 4 hr each night.  Work on not smoking .  Continue on BREO daily , rinse after use.  Continue Duoneb Three times a day  .  Follow up with with Dr. Sherene Sires  In 2 weeks and As needed   Please contact office for sooner follow up if symptoms do not improve or worsen or seek emergency care             Rubye Oaks, NP 08/04/2017

## 2017-08-04 NOTE — Assessment & Plan Note (Signed)
LE edema ? Diastolic dysfunction  Check labs with BNP  Change HCTZ to lasix   Plan  . Patient Instructions  Stop Hydrochlorothiazide .  Begin Lasix 20mg  daily -2 daily for 3 days then one daily  Labs today  Mucinex DM Twice daily  As needed  Cough /congestion  Use oxygen 2l/m with activity and At bedtime   Try wear the CPAP  At bedtime  At least 4 hr each night.  Work on not smoking .  Continue on BREO daily , rinse after use.  Continue Duoneb Three times a day  .  Follow up with with Dr. Sherene SiresWert  In 2 weeks and As needed   Please contact office for sooner follow up if symptoms do not improve or worsen or seek emergency care

## 2017-08-04 NOTE — Patient Instructions (Addendum)
Stop Hydrochlorothiazide .  Begin Lasix 20mg  daily -2 daily for 3 days then one daily  Labs today  Mucinex DM Twice daily  As needed  Cough /congestion  Use oxygen 2l/m with activity and At bedtime   Try wear the CPAP  At bedtime  At least 4 hr each night.  Work on not smoking .  Continue on BREO daily , rinse after use.  Continue Duoneb Three times a day  .  Follow up with with Dr. Sherene SiresWert  In 1 weeks and As needed   Please contact office for sooner follow up if symptoms do not improve or worsen or seek emergency care

## 2017-08-04 NOTE — Assessment & Plan Note (Signed)
REsolving flare - smoking cessation discussed   Plan  Patient Instructions  Stop Hydrochlorothiazide .  Begin Lasix 20mg  daily -2 daily for 3 days then one daily  Labs today  Mucinex DM Twice daily  As needed  Cough /congestion  Use oxygen 2l/m with activity and At bedtime   Try wear the CPAP  At bedtime  At least 4 hr each night.  Work on not smoking .  Continue on BREO daily , rinse after use.  Continue Duoneb Three times a day  .  Follow up with with Dr. Sherene SiresWert  In 2 weeks and As needed   Please contact office for sooner follow up if symptoms do not improve or worsen or seek emergency care

## 2017-08-04 NOTE — Assessment & Plan Note (Signed)
Use CPAP At bedtime  For at least 4hr each night  Pt education given .

## 2017-08-04 NOTE — Assessment & Plan Note (Signed)
Use oxygen 2l/m with activity .

## 2017-08-04 NOTE — Addendum Note (Signed)
Addended by: Boone MasterJONES, Francene Mcerlean E on: 08/04/2017 09:53 AM   Modules accepted: Orders

## 2017-08-05 NOTE — Progress Notes (Signed)
Chart and office note reviewed in detail  > agree with a/p as outlined  - may need alternative laba/ics if cough persists

## 2017-08-09 NOTE — Progress Notes (Signed)
Called spoke with patient, advised of lab results / recs as stated by TP.  Pt reported increased LE edema since beginning the lasix 20mg  1 tab daily over the weekend - slightly improved today with elevation.  Spoke with with TP, increase the lasix back to 2 daily until appt with MW on 08/12/17 and repeat BMET at visit.  Pt voiced her understanding and denied any further questions.  Pt is aware to contact the office if her symptoms do not improve or they worsen prior to office visit.

## 2017-08-12 ENCOUNTER — Ambulatory Visit: Payer: BLUE CROSS/BLUE SHIELD | Admitting: Internal Medicine

## 2017-08-12 ENCOUNTER — Encounter: Payer: Self-pay | Admitting: *Deleted

## 2017-08-12 ENCOUNTER — Encounter: Payer: Self-pay | Admitting: Internal Medicine

## 2017-08-12 ENCOUNTER — Telehealth: Payer: Self-pay | Admitting: Adult Health

## 2017-08-12 VITALS — BP 124/82 | HR 87 | Temp 98.2°F | Ht 61.0 in | Wt 284.0 lb

## 2017-08-12 DIAGNOSIS — J9611 Chronic respiratory failure with hypoxia: Secondary | ICD-10-CM

## 2017-08-12 DIAGNOSIS — F17210 Nicotine dependence, cigarettes, uncomplicated: Secondary | ICD-10-CM | POA: Diagnosis not present

## 2017-08-12 DIAGNOSIS — J4541 Moderate persistent asthma with (acute) exacerbation: Secondary | ICD-10-CM

## 2017-08-12 MED ORDER — MOMETASONE FURO-FORMOTEROL FUM 200-5 MCG/ACT IN AERO: 2.0000 | INHALATION_SPRAY | Freq: Two times a day (BID) | RESPIRATORY_TRACT | 11 refills | Status: DC

## 2017-08-12 MED ORDER — TIOTROPIUM BROMIDE MONOHYDRATE 2.5 MCG/ACT IN AERS: 2.0000 | INHALATION_SPRAY | Freq: Every day | RESPIRATORY_TRACT | 11 refills | Status: DC

## 2017-08-12 MED ORDER — TIOTROPIUM BROMIDE MONOHYDRATE 2.5 MCG/ACT IN AERS: 2.0000 | INHALATION_SPRAY | Freq: Every day | RESPIRATORY_TRACT | 0 refills | Status: DC

## 2017-08-12 MED ORDER — PREDNISONE 10 MG PO TABS
ORAL_TABLET | ORAL | 0 refills | Status: DC
Start: 2017-08-12 — End: 2017-08-25

## 2017-08-12 MED ORDER — SPIRONOLACTONE 25 MG PO TABS: 25.0000 mg | ORAL_TABLET | Freq: Two times a day (BID) | ORAL | 2 refills | Status: DC

## 2017-08-12 NOTE — Progress Notes (Signed)
Subjective:   Patient ID: Brenda [name], female    DOB: 19-Oct-1960    MRN: [id number]   Brief patient profile:  57  yowf quit smoking 07/2015  at wt 250 and admitted with" aecopd" but with pfts nearly nl 2/29/15 p rx so characterized as AB not copd    Admit date: 05/30/2013  Discharge date: 06/02/2013  HYPERTRIGLYCERIDEMIA  LEUKOCYTOSIS UNSPECIFIED  BIPOLAR AFFECTIVE DISORDER, MANIC  NICOTINE ADDICTION  HYPERTENSION, BENIGN ESSENTIAL  BRONCHITIS, OBSTRUCTIVE CHRONIC  EMPHYSEMA  REACTIVE AIRWAY DISEASE  COPD exacerbation  Chronic pain syndrome  Migraine, unspecified, without mention of intractable migraine without mention of status migrainosus   History of Present Illness  09/22/2013 1st [facility] office visit/ Brenda [name]  Chief Complaint  Patient presents with  . Pulmonary Consult    [name]. Pt c/o SOB since Dec 2014, but has been worse since Jan 2015. She reports that she gets out of breath walking just a few steps and wakes up out of breath 3-4 times every night. She also c/o cough that she states started "all the time"- non prod.   onset was abrupt but pattern has been progressively worse with minimal transient benefit from prednisone for up to week then back on again and attributes wt gain to steroids x around 50 lbs.  Cough is harsh barking. Sob no better from albterol, says xopenex works better   rec Stop advair, cozar and spiriva  xopenex 1.25 mg 4 x daily Prilosec 20 mg x 2 =  40 mg   Take 30-60 min before first meal of the day and Pepcid 20 mg one bedtime until return to office - this is the best way to tell whether stomach acid is contributing to your problem.  GERD diet  Diovan (losartan)160 one daily is your new blood pressure pill  For cough > use flutter valve and Take delsym two tsp every 12 hours and supplement if needed with  tramadol 50 mg up to 2 every 4 hours to suppress the urge to cough    03/25/15  rec Stop Dulera  Restart  Symbicort 2 puffs Twice daily   Restart Spiriva 1 puff daily .  Work on not smoking.  Keep up good work with CPAP At bedtime     Admit date: 07/20/2015 Discharge date: 07/26/2015 Recommendations for Outpatient Follow-up:  Acute hypoxic respiratory failure - acute COPD exacerbation No focal infiltrate on chest x-ray to suggest pneumonia - influenza panel negative - cont usual medical tx - given her decline this morning, and the fact that she would be an exceptionally difficult intubation, I have asked PCCM to see her in consultation (she is followed in the office by Dr. [name]) -Pulmicort nebs BID; will change over to inhaler in a.m. -Atrovent + Albuterol inhaler QID -DC Solu-Medrol; start prednisone 20 mg daily  -Titrate O2 to maintain SPO2 89 and 93% -Ambulatory SPO2 -PT/OT recommend no follow-up -Follow-up in one week with Dr. [name] or [name] acute respiratory failure with hypoxia 2dary COPD exacerbation -Instructed to restart Spiriva  -Patient was on Symbicort 160-4.5 g BID. Instructed to restart  SATURATION QUALIFICATIONS: (This note is used to comply with regulatory documentation for home oxygen) Patient Saturations on Room Air at Rest = 88% Patient Saturations on Room Air while Ambulating =85% Patient Saturations on 3Liters of oxygen while Ambulating = 92%  Please briefly explain why patient needs home oxygen: -Patient instructed to use 3 L O2 via [place] 24 hours  a day.  Hypertension -Blood pressure currently well controlled, will not restart previous BP medication  Lipomatous Hpertrophy Iteratrial Septum  -No action required in less patient begins to have arrhythmias.  Tobacco abuse -Counsel on absolute need to stop smoking permanently  -Schedule appointment for follow-up in 1- 2 weeks with Dr.Javier Sharen [name] for acute respiratory failure, tobacco abuse, OSA  Obstructive sleep apnea -CPAP daily at bedtime - followed by Dr. [name]  Morbid obesity - Body mass  index is 49.3 kg/(m^2).   Bipolar disorder -On admission patient not on medication  Diabetes type 2 with complications -Not sure patient is true diabetic, most likely iatrogenic secondary to steroids -2/23 hemoglobin A1c = 7.2  -On admission patient was not on diabetic medication will start patient on metformin 500 mg BID, and let her PCP titrate to effect  Hyperkalemia -Resolved    07/30/2015  f/u ov/Brenda [name] re:  AB / quit smoking 07/17/2015  Chief Complaint  Patient presents with  . HFU    Pt states that her breathing has improved some. She c/o nasal congestion and ears feeling stopped up.  She has not had bloody nasal d/c.   Last time could walk into work one year prior to OV   Using 02 nasal and cpap f/u sood for June 2017  Feels 02 is irritating nose/ causing obstruction  rec Congratulations on not smoking, it's the most important aspect of your care No change on your medications = symbicort and spiriva automatically every day Work on maintaining perfect  inhaler technique: Only use your albuterol as a rescue medication  Plug 02 into your cpap machine but no need to wear it otherwise  Keep appt to see Dr Brenda [name] with pfts as planned - call us sooner if needed       08/04/17 NP eval rec Stop Hydrochlorothiazide .  Begin Lasix 20mg  daily -2 daily for 3 days then one daily  Labs today  Mucinex DM Twice daily  As needed  Cough /congestion  Use oxygen 2l/m with activity and At bedtime   Try wear the CPAP  At bedtime  At least 4 hr each night.  Work on not smoking .  Continue on BREO daily , rinse after use.  Continue Duoneb Three times a day  .     08/12/2017 acute extended ov/Brenda [name] re: AB/ peripheral edema  Chief Complaint  Patient presents with  . Acute Visit    Cough and SOB are not improving. She is coughing with yellow sputum.  She uses her proair once daily on average and Duoneb 3 x daily.    never got completely free of cigs but started smoking a lot more p husband  passed in July 2018 from lung ca from smoking Baseline sob across parking stops half way s 02  And only wearing 02 with cpap Now doe = MMRC4  = sob if tries to leave home or while getting dressed / not sure 02 helps   No obvious day to day or daytime variability or assoc excess/ purulent sputum or mucus plugs or hemoptysis or cp or chest tightness, subjective wheeze or overt sinus or hb symptoms. No unusual exposure hx or h/o childhood pna/ asthma or knowledge of premature birth.  Sleeping ok on cpap and 02  without nocturnal  or early am exacerbation  of respiratory  c/o's or need for noct saba. Also denies any obvious fluctuation of symptoms with weather or environmental changes or other aggravating or alleviating factors except as outlined  above   Current Allergies, Complete Past Medical History, Past Surgical History, Family History, and Social History were reviewed in [employer] record.  ROS  The following are not active complaints unless bolded Hoarseness, sore throat, dysphagia, dental problems, itching, sneezing,  nasal congestion or discharge of excess mucus or purulent secretions, ear ache,   fever, chills, sweats, unintended wt loss or wt gain, classically pleuritic or exertional cp,  orthopnea pnd or leg swelling, presyncope, palpitations, abdominal pain, anorexia, nausea, vomiting, diarrhea  or change in bowel habits or change in bladder habits, change in stools or change in urine, dysuria, hematuria,  rash, arthralgias, visual complaints, headache, numbness, weakness or ataxia or problems with walking or coordination,  change in mood/affect or memory.        Current Meds  Medication Sig  . albuterol (PROVENTIL) (2.5 MG/3ML) 0.083% nebulizer solution INHALE 1 VIAL VIA NEBULIZER EVERY 6 HOURS AS NEEDED FOR WHEEZING/SHORT BREATH  . benzonatate (TESSALON) 200 MG capsule Take 1 capsule (200 mg total) by mouth 3 (three) times daily as needed for cough. Swallow whole,  to not bite pill  . cetirizine (ZYRTEC) 10 MG tablet Take 10 mg by mouth daily.   . citalopram (CELEXA) 20 MG tablet Take 1 tablet (20 mg total) daily by mouth.  . fluticasone (FLONASE) 50 MCG/ACT nasal spray SPRAY TWICE INTO EACH NOSTRIL EVERY DAY  . furosemide (LASIX) 20 MG tablet 2 tabs daily for 3 days, then 1 daily (Patient taking differently: 2 tabs daily)  . gabapentin (NEURONTIN) 300 MG capsule Take 1 capsule (300 mg total) by mouth 2 (two) times daily.  Marland Kitchen guaiFENesin (MUCINEX) 600 MG 12 hr tablet Take 1 tablet (600 mg total) by mouth 2 (two) times daily.  Marland Kitchen ibuprofen (ADVIL) 200 MG tablet Take 400 mg by mouth every 6 (six) hours as needed for headache.  . ipratropium-albuterol (DUONEB) 0.5-2.5 (3) MG/3ML SOLN Take 3 mLs by nebulization every 4 (four) hours as needed (For wheezing or shortness of breath.).  Marland Kitchen LORazepam (ATIVAN) 0.5 MG tablet TAKE 1/2 TO 1 TABLET BY MOUTH TWICE A DAY AS NEEDED (Patient taking differently: TAKE 1/2 TO 1 TABLET BY MOUTH TWICE A DAY AS NEEDED FOR ANXIETY.)  . metFORMIN (GLUCOPHAGE) 500 MG tablet TAKE 1 TABLET BY MOUTH EVERYDAY AT BEDTIME  . Multiple Vitamin (MULTIVITAMIN WITH MINERALS) TABS tablet Take 1 tablet by mouth daily.  Marland Kitchen nystatin (MYCOSTATIN) 100000 UNIT/ML suspension TAKE 1 TEASPOONFUL BY MOUTH 3 TIMES A DAY FOR 10 DAYS (Patient taking differently: TAKE 1 TEASPOONFUL BY MOUTH 3 TIMES DAILY AS NEEDED FOR MOUTH SORES.)  . OXYGEN 2 lpm with sleep with CPAP and during the day with exertion  . PROAIR HFA 108 (90 Base) MCG/ACT inhaler INHALE 2 PUFFS INTO THE LUNGS EVERY 6 (SIX) HOURS AS NEEDED FOR WHEEZING OR SHORTNESS OF BREATH.  . promethazine-dextromethorphan (PROMETHAZINE-DM) 6.25-15 MG/5ML syrup Take 5 mLs by mouth 4 (four) times daily as needed for cough.  Marland Kitchen   BREO ELLIPTA 200-25 MCG/INH AEPB INHALE 1 PUFF INTO THE LUNGS DAILY.  Marland Kitchen   PROAIR RESPICLICK 108 (90 Base) MCG/ACT AEPB INHALE 2 PUFFS INTO THE LUNGS EVERY 6 HOURS AS NEEDED FOR DYSPNEA/WHEEZE                     Objective:   Physical Exam  Massively obese wf nad with harsh dry upper airway pattern cough   10/06/2013         288 > 11/17/2013  288> 07/30/2015  266  > 08/12/2017   284     09/22/13 293 lb 12.8 oz (133.267 kg)  09/18/13 289 lb 4 oz (131.203 kg)  08/21/13 283 lb 8 oz (128.595 kg)      Vital signs reviewed - Note on arrival 02 sats  97% on 2lpm continous    HEENT: nl   turbinates bilaterally, and oropharynx. Nl external ear canals without cough reflex - top denture    NECK :  without JVD/Nodes/TM/ nl carotid upstrokes bilaterally   LUNGS: no acc muscle use,  Nl contour chest  With insp/exp harsh upper airway 'wheezing"    CV:  RRR  no s3 or murmur or increase in P2, and  1+ pitting edema both lower ext   ABD:  soft and nontender with nl inspiratory excursion in the supine position. No bruits or organomegaly appreciated, bowel sounds nl  MS:  Nl gait/ ext warm without deformities, calf tenderness, cyanosis or clubbing No obvious joint restrictions   SKIN: warm and dry without lesions    NEURO:  alert, approp, nl sensorium with  no motor or cerebellar deficits apparent.         I personally reviewed images and agree with radiology impression as follows:  CXR:   07/05/17          Assessment & Plan:

## 2017-08-12 NOTE — Telephone Encounter (Signed)
Forms received and placed in TP's lookat folder Routing to myself and TP

## 2017-08-12 NOTE — Patient Instructions (Addendum)
Continue lasix is 2 daily until better then 1 daily   Aldactone 25 mg one twice daily   Prednisone 10 mg take  4 each am x 2 days,   2 each am x 2 days,  1 each am x 2 days and stop   Stop breo and respiclick  Plan A = Automatic = dulera 200 Take 2 puffs first thing in am and chase spiriva 2 pffs and then another 2 puffs  dulera about 12 hours later.     Plan B = Backup Only use your albuterol inhaler (not the powder/resplick)  as a rescue medication to be used if you can't catch your breath by resting or doing a relaxed purse lip breathing pattern.  - The less you use it, the better it will work when you need it. - Ok to use the inhaler up to 2 puffs  every 4 hours if you must but call for appointment if use goes up over your usual need - Don't leave home without it !!  (think of it like the spare tire for your car)    Plan C = Crisis - only use your albuterol nebulizer if you first try Plan B and it fails to help > ok to use the nebulizer up to every 4 hours but if start needing it regularly call for immediate appointment   See Tammy NP w/in 2 weeks with all your medications, even over the counter meds, separated in two separate bags, the ones you take no matter what vs the ones you stop once you feel better and take only as needed when you feel you need them.   Tammy  will generate for you a new user friendly medication calendar that will put us all on the same page re: your medication use.     Without this process, it simply isn't possible to assure that we are providing  your outpatient care  with  the attention to detail we feel you deserve.   If we cannot assure that you're getting that kind of care,  then we cannot manage your problem effectively from this clinic.  Once you have seen Tammy and we are sure that we're all on the same page with your medication use she will arrange follow up with me.

## 2017-08-13 NOTE — Telephone Encounter (Signed)
Short term disability forms filled out and signed by TP [facility]

## 2017-08-13 NOTE — Telephone Encounter (Signed)
Rec'd completed forms back - fwd to [employer] via interoffice mail -pr  °

## 2017-08-15 ENCOUNTER — Other Ambulatory Visit: Payer: Self-pay | Admitting: Family Medicine

## 2017-08-17 ENCOUNTER — Encounter: Payer: Self-pay | Admitting: Internal Medicine

## 2017-08-17 NOTE — Assessment & Plan Note (Signed)

## 2017-08-17 NOTE — Assessment & Plan Note (Signed)
Body mass index is 53.66 kg/m.  -  trending up Lab Results  Component Value Date   TSH 2.27 07/24/2016     Contributing to gerd risk/ doe/reviewed the need and the process to achieve and maintain neg calorie balance > defer f/u primary care including intermittently monitoring thyroid status

## 2017-08-17 NOTE — Assessment & Plan Note (Signed)
Last bicarb was 31 on 08/04/17 so probably has incipient ohs component > rx 2lpm 24/7 and f/u with Dr Craige CottaSood for OSA

## 2017-08-17 NOTE — Assessment & Plan Note (Addendum)
Spirometry 2/9 2015:    FEV1 1.45 (58%) ratio 71 pre B2 with min response to saba  - 10/06/2013  try dulera 100  2bid > improved - quit smoking 07/17/15 > resumed July 2018    - 08/12/2017  After extensive coaching inhaler device  effectiveness =    90% changed breo/respiclick to dulera 200/ spiriva smi/ saba hfa due to cough   Comment: DDX of  difficult airways management almost all start with A and  include Adherence, Ace Inhibitors, Acid Reflux, Active Sinus Disease, Alpha 1 Antitripsin deficiency, Anxiety masquerading as Airways dz,  ABPA,  Allergy(esp in young), Aspiration (esp in elderly), Adverse effects of meds,  Active smokers, A bunch of PE's (a small clot burden can't cause this syndrome unless there is already severe underlying pulm or vascular dz with poor reserve) plus two Bs  = Bronchiectasis and Beta blocker use..and one C= CHF  Adherence is always the initial "prime suspect" and is a multilayered concern that requires a "trust but verify" approach in every patient - starting with knowing how to use medications, especially inhalers, correctly, keeping up with refills and understanding the fundamental difference between maintenance and prns vs those medications only taken for a very short course and then stopped and not refilled.  - see hfa teaching - return with all meds in hand using a trust but verify approach to confirm accurate Medication  Reconciliation The principal here is that until we are certain that the  patients are doing what we've asked, it makes no sense to ask them to do more.   Active smoking at top of the usual list of suspects: see sep a/p  ? Adverse effects of dpi's, not a good choice here given severity of cough > change to hfa/smi  ? Allergy > Prednisone 10 mg take  4 each am x 2 days,   2 each am x 2 days,  1 each am x 2 days and stop   ? Acid (or non-acid) GERD > always difficult to exclude as up to 75% of pts in some series report no assoc GI/ Heartburn  symptoms> rec consider adding gerd rx when returns for med rec if not improved    ? Anxiety/depression > usually at the bottom of this list of usual suspects but should be   higher on this pt's based on H and P and note already on psychotropics/ may interfere with adherence/ability to quit smoking   ? CHF/ cardiac asthma:  Chart reviewed, last echo ok and bnp only 17 on 08/04/17 though could still be developing cor pulmonale > rx  Add aldactone to lasix and recheck bmet in 2 weeks   I had an extended discussion with the patient reviewing all relevant studies completed to date and  lasting 25 minutes of a 40  minute acute office visit with pt not familiar to me     re  severe non-specific but potentially very serious refractory respiratory symptoms of uncertain and potentially multiple  etiologies.   Each maintenance medication was reviewed in detail including most importantly the difference between maintenance and prns and under what circumstances the prns are to be triggered using an action plan format that is not reflected in the computer generated alphabetically organized AVS.    Please see AVS for specific instructions unique to this office visit that I personally wrote and verbalized to the the pt in detail and then reviewed with pt  by my nurse highlighting any changes in therapy/plan  of care  recommended at today's visit.

## 2017-08-20 ENCOUNTER — Other Ambulatory Visit: Payer: Self-pay | Admitting: Family Medicine

## 2017-08-20 NOTE — Telephone Encounter (Signed)
Eprescribed.

## 2017-08-20 NOTE — Telephone Encounter (Signed)
Last filled:  05/31/17, #30 Last OV:  07/20/17 Next OV:  08/25/17

## 2017-08-24 ENCOUNTER — Telehealth: Payer: Self-pay | Admitting: Pulmonary Disease

## 2017-08-24 NOTE — Telephone Encounter (Signed)
Checked both fax machines, did not see fax. Will continue to watch for fax.

## 2017-08-25 ENCOUNTER — Ambulatory Visit: Payer: BLUE CROSS/BLUE SHIELD | Admitting: Family Medicine

## 2017-08-25 ENCOUNTER — Encounter: Payer: Self-pay | Admitting: Family Medicine

## 2017-08-25 VITALS — BP 124/78 | HR 85 | Temp 98.3°F | Wt 285.0 lb

## 2017-08-25 DIAGNOSIS — Z9989 Dependence on other enabling machines and devices: Secondary | ICD-10-CM

## 2017-08-25 DIAGNOSIS — R609 Edema, unspecified: Secondary | ICD-10-CM | POA: Diagnosis not present

## 2017-08-25 DIAGNOSIS — J4541 Moderate persistent asthma with (acute) exacerbation: Secondary | ICD-10-CM

## 2017-08-25 DIAGNOSIS — J9611 Chronic respiratory failure with hypoxia: Secondary | ICD-10-CM | POA: Diagnosis not present

## 2017-08-25 DIAGNOSIS — F17210 Nicotine dependence, cigarettes, uncomplicated: Secondary | ICD-10-CM

## 2017-08-25 DIAGNOSIS — G4733 Obstructive sleep apnea (adult) (pediatric): Secondary | ICD-10-CM

## 2017-08-25 DIAGNOSIS — F4322 Adjustment disorder with anxiety: Secondary | ICD-10-CM | POA: Diagnosis not present

## 2017-08-25 MED ORDER — FUROSEMIDE 20 MG PO TABS: 20.0000 mg | ORAL_TABLET | Freq: Every day | ORAL | 3 refills | Status: DC

## 2017-08-25 NOTE — Patient Instructions (Addendum)
Labs today.  Work on fully quitting smoking.  Keep follow up tomorrow with Tammy .

## 2017-08-25 NOTE — Telephone Encounter (Signed)
[place] [employer] [place] [phone].  Calling to see if forms have been recd by fax.  Advised per note this morning they have not.  He is re-faxing them over now.

## 2017-08-25 NOTE — Progress Notes (Signed)
BP 124/78 (BP Location: Left Arm, Patient Position: Sitting, Cuff Size: Large)   Pulse 85   Temp 98.3 F (36.8 C) (Oral)   Wt 285 lb (129.3 kg)   LMP 03/08/2013   SpO2 98% Comment: 2 L  BMI 53.85 kg/m    CC: 6 mo f/u visit Subjective:    Patient ID: Brenda [name], female    DOB: 1961-02-18, 57 y.o.   MRN: [id number]  HPI: Brenda [name] is a 57 y.o. female presenting on 08/25/2017 for 6 mo follow-up   Recent hospitalization for COPD exacerbation, established with pulmonology Dr Sherene Sires, latest notes reviewed. HCTZ stopped, lasix started, then spironolactone started 25mg  bid. Now on 2L Oxygen by nasal cannula with activity. She is using CPAP nightly for OSA. Placed on prednisone course (completed). breo stopped. dulera 2 puffs bid started and spiriva spray started. F/u with Tammy tomorrow.   Not noticing much difference as far as breathing. Staying dyspneic at rest. She does feel prednisone helps her breathing but has noted weight gain while on this. Mild productive cough. No fevers/chills.  Leg swelling has improved since lasix and spironolactone was started. Down to once daily lasix.  Endorses decreasing smoking - 1-2 cig/day.   Latest echo 07/2015 - EF 60%, normal wall motion, increased atrial septal thickness consistent with lipomatous hypertrophy.   Continues celexa 20mg  daily, continues lorazepam 0.5mg  BID PRN.   Remains out of work.   Relevant past medical, surgical, family and social history reviewed and updated as indicated. Interim medical history since our last visit reviewed. Allergies and medications reviewed and updated. Outpatient Medications Prior to Visit  Medication Sig Dispense Refill  . albuterol (PROVENTIL) (2.5 MG/3ML) 0.083% nebulizer solution INHALE 1 VIAL VIA NEBULIZER EVERY 6 HOURS AS NEEDED FOR WHEEZING/SHORT BREATH 150 mL 3  . benzonatate (TESSALON) 200 MG capsule Take 1 capsule (200 mg total) by mouth 3 (three) times daily as needed for cough. Swallow  whole, to not bite pill 30 capsule 1  . cetirizine (ZYRTEC) 10 MG tablet Take 10 mg by mouth daily.     . citalopram (CELEXA) 20 MG tablet TAKE 1 TABLET BY MOUTH EVERY DAY 90 tablet 0  . fluticasone (FLONASE) 50 MCG/ACT nasal spray SPRAY TWICE INTO EACH NOSTRIL EVERY DAY 48 g 0  . gabapentin (NEURONTIN) 300 MG capsule Take 1 capsule (300 mg total) by mouth 2 (two) times daily. 180 capsule 3  . guaiFENesin (MUCINEX) 600 MG 12 hr tablet Take 1 tablet (600 mg total) by mouth 2 (two) times daily. 30 tablet 0  . ibuprofen (ADVIL) 200 MG tablet Take 400 mg by mouth every 6 (six) hours as needed for headache.    . ipratropium-albuterol (DUONEB) 0.5-2.5 (3) MG/3ML SOLN Take 3 mLs by nebulization every 4 (four) hours as needed (For wheezing or shortness of breath.). 225 mL 3  . LORazepam (ATIVAN) 0.5 MG tablet TAKE 1/2 TO 1 TABLET BY MOUTH TWICE A DAY AS NEEDED FOR ANXIETY. 30 tablet 0  . metFORMIN (GLUCOPHAGE) 500 MG tablet TAKE 1 TABLET BY MOUTH EVERYDAY AT BEDTIME 90 tablet 0  . mometasone-formoterol (DULERA) 200-5 MCG/ACT AERO Inhale 2 puffs into the lungs 2 (two) times daily. 1 Inhaler 11  . Multiple Vitamin (MULTIVITAMIN WITH MINERALS) TABS tablet Take 1 tablet by mouth daily.    Marland Kitchen nystatin (MYCOSTATIN) 100000 UNIT/ML suspension TAKE 1 TEASPOONFUL BY MOUTH 3 TIMES A DAY FOR 10 DAYS (Patient taking differently: TAKE 1 TEASPOONFUL BY MOUTH 3 TIMES DAILY AS  NEEDED FOR MOUTH SORES.) 150 mL 0  . OXYGEN 2 lpm with sleep with CPAP and during the day with exertion    . PROAIR HFA 108 (90 Base) MCG/ACT inhaler INHALE 2 PUFFS INTO THE LUNGS EVERY 6 (SIX) HOURS AS NEEDED FOR WHEEZING OR SHORTNESS OF BREATH. 8.5 Inhaler 2  . promethazine-dextromethorphan (PROMETHAZINE-DM) 6.25-15 MG/5ML syrup Take 5 mLs by mouth 4 (four) times daily as needed for cough. 150 mL 0  . spironolactone (ALDACTONE) 25 MG tablet Take 1 tablet (25 mg total) by mouth 2 (two) times daily. 60 tablet 2  . Tiotropium Bromide Monohydrate  (SPIRIVA RESPIMAT) 2.5 MCG/ACT AERS Inhale 2 puffs into the lungs daily. 1 Inhaler 11  . furosemide (LASIX) 20 MG tablet 2 tabs daily for 3 days, then 1 daily (Patient taking differently: 2 tabs daily) 36 tablet 1  . predniSONE (DELTASONE) 10 MG tablet Take  4 each am x 2 days,   2 each am x 2 days,  1 each am x 2 days and stop 14 tablet 0  . citalopram (CELEXA) 20 MG tablet Take 1 tablet (20 mg total) daily by mouth. 90 tablet 1   No facility-administered medications prior to visit.      Per HPI unless specifically indicated in ROS section below Review of Systems     Objective:    BP 124/78 (BP Location: Left Arm, Patient Position: Sitting, Cuff Size: Large)   Pulse 85   Temp 98.3 F (36.8 C) (Oral)   Wt 285 lb (129.3 kg)   LMP 03/08/2013   SpO2 98% Comment: 2 L  BMI 53.85 kg/m   Wt Readings from Last 3 Encounters:  08/25/17 285 lb (129.3 kg)  08/12/17 284 lb (128.8 kg)  08/04/17 280 lb 3.2 oz (127.1 kg)    Physical Exam  Constitutional: She appears well-developed and well-nourished. No distress.  [place] in place at 2L  HENT:  Mouth/Throat: Oropharynx is clear and moist. No oropharyngeal exudate.  Eyes: Pupils are equal, round, and reactive to light. Conjunctivae and EOM are normal.  Cardiovascular: Normal rate, regular rhythm, normal heart sounds and intact distal pulses.  No murmur heard. Pulmonary/Chest: Effort normal. No respiratory distress. She has decreased breath sounds. She has no wheezes. She has no rales.  Coarse breath sounds throughout but no wheezing or rales appreciated  Musculoskeletal: She exhibits edema (tr).  Skin: Skin is warm and dry. No rash noted.  Psychiatric: She has a normal mood and affect.  Nursing note and vitals reviewed.  Results for orders placed or performed in visit on 08/04/17  CBC with Differential/Platelet  Result Value Ref Range   WBC 15.0 (H) 4.0 - 10.5 K/uL   RBC 4.64 3.87 - 5.11 Mil/uL   Hemoglobin 13.6 12.0 - 15.0 g/dL   HCT 16.140.9  09.636.0 - 04.546.0 %   MCV 88.0 78.0 - 100.0 fl   MCHC 33.3 30.0 - 36.0 g/dL   RDW 40.915.6 (H) 81.111.5 - 91.415.5 %   Platelets 280.0 150.0 - 400.0 K/uL   Neutrophils Relative % 69.5 43.0 - 77.0 %   Lymphocytes Relative 20.3 12.0 - 46.0 %   Monocytes Relative 5.4 3.0 - 12.0 %   Eosinophils Relative 4.2 0.0 - 5.0 %   Basophils Relative 0.6 0.0 - 3.0 %   Neutro Abs 10.5 (H) 1.4 - 7.7 K/uL   Lymphs Abs 3.1 0.7 - 4.0 K/uL   Monocytes Absolute 0.8 0.1 - 1.0 K/uL   Eosinophils Absolute 0.6 0.0 - 0.7  K/uL   Basophils Absolute 0.1 0.0 - 0.1 K/uL  B Nat Peptide  Result Value Ref Range   Pro B Natriuretic peptide (BNP) 17.0 0.0 - 100.0 pg/mL  Basic Metabolic Panel (BMET)  Result Value Ref Range   Sodium 138 135 - 145 mEq/L   Potassium 3.8 3.5 - 5.1 mEq/L   Chloride 98 96 - 112 mEq/L   CO2 31 19 - 32 mEq/L   Glucose, Bld 123 (H) 70 - 99 mg/dL   BUN 10 6 - 23 mg/dL   Creatinine, Ser 1.61 0.40 - 1.20 mg/dL   Calcium 09.6 8.4 - 04.5 mg/dL   GFR 40.98 >11.91 mL/min      Assessment & Plan:   Problem List Items Addressed This Visit    Adjustment disorder with anxiety    Continue celexa with PRN lorazepam. Some anxiety stemming from respiratory issues.       Asthmatic bronchitis - Primary    Oxygen dependent asthmatic bronchitis. Followed closely by pulmonology. Appreciate their care. Has close f/u later this week.       Chronic respiratory failure with hypoxia (HCC)    Now oxygen dependent.       Relevant Orders   Basic metabolic panel   Cigarette smoker    Continue to encourage full cessation. She is working on this, down to 1-2 cig/day.       Edema    Concern for worsening diastolic CHF, BNP was normal. Now on lasix, spironolactone. Will update BMP. Reviewed latest echo from 07/2015. Consider updated.       OSA on CPAP    Reports compliance with CPAP at night, uses O2 with this.           Meds ordered this encounter  Medications  . furosemide (LASIX) 20 MG tablet    Sig: Take 1  tablet (20 mg total) by mouth daily.    Dispense:  30 tablet    Refill:  3   Orders Placed This Encounter  Procedures  . Basic metabolic panel    Follow up plan: No follow-ups on file.  [name], MD

## 2017-08-25 NOTE — Telephone Encounter (Signed)
Still waiting on forms. Checked both fax machines and did not see the forms. Left a message for the forms to be refaxed.

## 2017-08-26 ENCOUNTER — Encounter: Payer: Self-pay | Admitting: Adult Health

## 2017-08-26 ENCOUNTER — Other Ambulatory Visit: Payer: Self-pay | Admitting: Adult Health

## 2017-08-26 ENCOUNTER — Ambulatory Visit: Payer: BLUE CROSS/BLUE SHIELD | Admitting: Adult Health

## 2017-08-26 DIAGNOSIS — G4733 Obstructive sleep apnea (adult) (pediatric): Secondary | ICD-10-CM | POA: Diagnosis not present

## 2017-08-26 DIAGNOSIS — R05 Cough: Secondary | ICD-10-CM

## 2017-08-26 DIAGNOSIS — I5189 Other ill-defined heart diseases: Secondary | ICD-10-CM | POA: Insufficient documentation

## 2017-08-26 DIAGNOSIS — Z9989 Dependence on other enabling machines and devices: Secondary | ICD-10-CM | POA: Diagnosis not present

## 2017-08-26 DIAGNOSIS — J9611 Chronic respiratory failure with hypoxia: Secondary | ICD-10-CM

## 2017-08-26 DIAGNOSIS — I519 Heart disease, unspecified: Secondary | ICD-10-CM | POA: Diagnosis not present

## 2017-08-26 DIAGNOSIS — R062 Wheezing: Secondary | ICD-10-CM

## 2017-08-26 DIAGNOSIS — J4541 Moderate persistent asthma with (acute) exacerbation: Secondary | ICD-10-CM | POA: Diagnosis not present

## 2017-08-26 DIAGNOSIS — R059 Cough, unspecified: Secondary | ICD-10-CM

## 2017-08-26 LAB — BASIC METABOLIC PANEL
BUN: 14 mg/dL (ref 6–23)
CO2: 29 mEq/L (ref 19–32)
Calcium: 9.5 mg/dL (ref 8.4–10.5)
Chloride: 100 mEq/L (ref 96–112)
Creatinine, Ser: 0.84 mg/dL (ref 0.40–1.20)
GFR: 73.73 mL/min (ref >60.00)
Glucose, Bld: 120 mg/dL — ABNORMAL HIGH (ref 70–99)
Potassium: 4.1 mEq/L (ref 3.5–5.1)
Sodium: 138 mEq/L (ref 135–145)

## 2017-08-26 MED ORDER — ALBUTEROL SULFATE HFA 108 (90 BASE) MCG/ACT IN AERS: 2.0000 | INHALATION_SPRAY | Freq: Four times a day (QID) | RESPIRATORY_TRACT | 5 refills | Status: DC | PRN

## 2017-08-26 MED ORDER — DOXYCYCLINE HYCLATE 100 MG PO TABS: 100.0000 mg | ORAL_TABLET | Freq: Two times a day (BID) | ORAL | 0 refills | Status: DC

## 2017-08-26 MED ORDER — FUROSEMIDE 20 MG PO TABS: 40.0000 mg | ORAL_TABLET | Freq: Every day | ORAL | 3 refills | Status: DC

## 2017-08-26 NOTE — Progress Notes (Signed)
Chart and office note reviewed in detail  > agree with a/p as outlined    

## 2017-08-26 NOTE — Progress Notes (Signed)
@Patient  ID: Brenda [name], female    DOB: 03/12/1961, 57 y.o.   MRN: [id number]  Chief Complaint  Patient presents with  . Follow-up    COPD     Referring provider: [name], MD  HPI: 57 year old female former smoker, morbidly obese, followed for COPD and obstructive sleep apnea  Tests PFT 07/29/06 >> FEV1 1.75 (73%), FEV1% 60, TLC 4.81 (105%), DLCO 55%, + BD HST 12/08/14 >> AHI 58.6, SaO2 low 64%. A1AT 03/25/15 >> 176, MM Auto CPAP 10/02/15 to 10/31/15 >>used on 30 of 30 nights with average 7 hrs 19 min. Average AHI 1 with median CPAP 9 and 95 th percentile CPAP 13 cm H2O PFT 11/04/14 >> FEV1 1.36 (57%), FEV1% 63, TLC 4.12 (89%), DLCO 93%, +BD  08/26/2017 Follow up  ;COPD , OSA  Patient returns for a 2-week follow-up. Patient has been having difficulty with slow to resolve COPD exacerbation with suspected volume overload.  She was changed from had chlorothiazide to Lasix.  And spironolactone 25 mg twice daily was added.  She was also treated with a prednisone taper.  Patient feels she is Continues to smoke. Discussed cessation. Has cut back.  Has moderate COPD , on Spiriva Respimat  And Dulera . Recently changed from [facility] and spiriva handihaler  She has underlying OSA and suspected OHS.  She is supposed to be on CPAP . Says she started back on CPAP over last 2 weeks . Tries to wear all night .  Download shows moderate usage with good control.  Encouraged on compliance. She is on oxygen 2l/m  Has not seen any change in leg swelling , wt is up 5 lbs over last 3 weeks. Still gets winded with minimal activity . Cough is increased with yellow and brown mucus for last 2 days . No fever or hemoptysis .  We reviewed all her medications organize them into a medication calendar with patient education she appears to be taking them correctly     Allergies  Allergen Reactions  . Metolazone Other (See Comments)    Metabolic mood swings    Immunization History  Administered  Date(s) Administered  . Influenza Whole 04/01/2002  . Influenza,inj,Quad PF,6+ Mos 03/21/2015  . Influenza-Unspecified 03/01/2014, 04/22/2016, 03/25/2017  . Pneumococcal Polysaccharide-23 04/01/2002, 07/24/2015  . Td 06/01/1993  . Tdap 02/24/2017    Past Medical History:  Diagnosis Date  . Acute pancreatitis   . Asthma    main dyspnea thought due to deconditioning (10/2013)  . Bipolar I disorder, most recent episode (or current) manic, unspecified    pt denies this  . Bronchitis, chronic obstructive (HCC) 2008 & 2009   FeV1 64% TLC 105% DLCO 55% 2008  -  FeV1 81% FeF 25-75 43% 2009  . COPD (chronic obstructive pulmonary disease) (HCC)    emphysema, not an issue per pulm (10/2013)  . History of pyelonephritis 05/2012  . HTN (hypertension)   . Hx of migraines   . Knee pain, right   . Leukocytosis, unspecified   . Lumbar disc disease with radiculopathy    multilevel spondylitic changes, scoliosis, and anterolisthesis L4 not thought to currently be surg candidate (Dr. Phoebe Perch, [facility]) (Dr. Ollen Bowl, [facility])  . Nicotine addiction   . Nonspecific abnormal electrocardiogram (ECG) (EKG)   . Obesity   . Pure hyperglyceridemia   . Suicide attempt (HCC) 06/2001  . Swelling of limb   . Urge and stress incontinence 07/2012   ([facility])    Tobacco History: Social History  Tobacco Use  Smoking Status Current Some Day Smoker  . Years: 30.00  . Types: Cigarettes  Smokeless Tobacco Never Used  Tobacco Comment   4 cig a day   Ready to quit: No Counseling given: Yes Comment: 4 cig a day   Outpatient Encounter Medications as of 08/26/2017  Medication Sig  . albuterol (PROAIR HFA) 108 (90 Base) MCG/ACT inhaler Inhale 2 puffs into the lungs every 6 (six) hours as needed for wheezing or shortness of breath.  Marland Kitchen albuterol (PROVENTIL) (2.5 MG/3ML) 0.083% nebulizer solution INHALE 1 VIAL VIA NEBULIZER EVERY 6 HOURS AS NEEDED FOR WHEEZING/SHORT BREATH  . benzonatate (TESSALON) 200 MG capsule  Take 1 capsule (200 mg total) by mouth 3 (three) times daily as needed for cough. Swallow whole, to not bite pill  . cetirizine (ZYRTEC) 10 MG tablet Take 10 mg by mouth daily.   . citalopram (CELEXA) 20 MG tablet Take 1 tablet (20 mg total) daily by mouth.  . citalopram (CELEXA) 20 MG tablet TAKE 1 TABLET BY MOUTH EVERY DAY  . doxycycline (VIBRA-TABS) 100 MG tablet Take 1 tablet (100 mg total) by mouth 2 (two) times daily.  . fluticasone (FLONASE) 50 MCG/ACT nasal spray SPRAY TWICE INTO EACH NOSTRIL EVERY DAY  . furosemide (LASIX) 20 MG tablet Take 2 tablets (40 mg total) by mouth daily.  Marland Kitchen gabapentin (NEURONTIN) 300 MG capsule Take 1 capsule (300 mg total) by mouth 2 (two) times daily.  Marland Kitchen guaiFENesin (MUCINEX) 600 MG 12 hr tablet Take 1 tablet (600 mg total) by mouth 2 (two) times daily.  Marland Kitchen ibuprofen (ADVIL) 200 MG tablet Take 400 mg by mouth every 6 (six) hours as needed for headache.  . ipratropium-albuterol (DUONEB) 0.5-2.5 (3) MG/3ML SOLN Take 3 mLs by nebulization every 4 (four) hours as needed (For wheezing or shortness of breath.).  Marland Kitchen LORazepam (ATIVAN) 0.5 MG tablet TAKE 1/2 TO 1 TABLET BY MOUTH TWICE A DAY AS NEEDED FOR ANXIETY.  . metFORMIN (GLUCOPHAGE) 500 MG tablet TAKE 1 TABLET BY MOUTH EVERYDAY AT BEDTIME  . mometasone-formoterol (DULERA) 200-5 MCG/ACT AERO Inhale 2 puffs into the lungs 2 (two) times daily.  . Multiple Vitamin (MULTIVITAMIN WITH MINERALS) TABS tablet Take 1 tablet by mouth daily.  Marland Kitchen nystatin (MYCOSTATIN) 100000 UNIT/ML suspension TAKE 1 TEASPOONFUL BY MOUTH 3 TIMES A DAY FOR 10 DAYS (Patient taking differently: TAKE 1 TEASPOONFUL BY MOUTH 3 TIMES DAILY AS NEEDED FOR MOUTH SORES.)  . OXYGEN 2 lpm with sleep with CPAP and during the day with exertion  . promethazine-dextromethorphan (PROMETHAZINE-DM) 6.25-15 MG/5ML syrup Take 5 mLs by mouth 4 (four) times daily as needed for cough.  Marland Kitchen spironolactone (ALDACTONE) 25 MG tablet Take 1 tablet (25 mg total) by mouth 2 (two)  times daily.  . Tiotropium Bromide Monohydrate (SPIRIVA RESPIMAT) 2.5 MCG/ACT AERS Inhale 2 puffs into the lungs daily.  . [DISCONTINUED] furosemide (LASIX) 20 MG tablet Take 1 tablet (20 mg total) by mouth daily.  . [DISCONTINUED] PROAIR HFA 108 (90 Base) MCG/ACT inhaler INHALE 2 PUFFS INTO THE LUNGS EVERY 6 (SIX) HOURS AS NEEDED FOR WHEEZING OR SHORTNESS OF BREATH.   No facility-administered encounter medications on file as of 08/26/2017.      Review of Systems  Constitutional:   No  weight loss, night sweats,  Fevers, chills,  +fatigue, or  lassitude.  HEENT:   No headaches,  Difficulty swallowing,  Tooth/dental problems, or  Sore throat,  No sneezing, itching, ear ache,  +nasal congestion, post nasal drip,   CV:  No chest pain,  Orthopnea, PND, swelling in lower extremities, anasarca, dizziness, palpitations, syncope.   GI  No heartburn, indigestion, abdominal pain, nausea, vomiting, diarrhea, change in bowel habits, loss of appetite, bloody stools.   Resp:    No chest wall deformity  Skin: no rash or lesions.  GU: no dysuria, change in color of urine, no urgency or frequency.  No flank pain, no hematuria   MS:  No joint pain or swelling.  No decreased range of motion.  No back pain.    Physical Exam  BP 122/74 (BP Location: Left Arm, Cuff Size: Normal)   Pulse 100   Ht 5\' 1"  (1.549 m)   Wt 285 lb 6.4 oz (129.5 kg)   LMP 03/08/2013   SpO2 98%   BMI 53.93 kg/m   GEN: A/Ox3; pleasant , NAD, obese    HEENT:  [place]/AT,  EACs-clear, TMs-wnl, NOSE-clear, THROAT-clear, no lesions, no postnasal drip or exudate noted.   NECK:  Supple w/ fair ROM; no JVD; normal carotid impulses w/o bruits; no thyromegaly or nodules palpated; no lymphadenopathy.    RESP  Few trace rhonchi ,  no accessory muscle use, no dullness to percussion  CARD:  RRR, no m/r/g, 1+ peripheral edema, pulses intact, no cyanosis or clubbing.  GI:   Soft & nt; nml bowel sounds; no organomegaly  or masses detected.   Musco: Warm bil, no deformities or joint swelling noted.   Neuro: alert, no focal deficits noted.    Skin: Warm, no lesions or rashes    Lab Results:  CBC    Component Value Date/Time   WBC 15.0 (H) 08/04/2017 1007   RBC 4.64 08/04/2017 1007   HGB 13.6 08/04/2017 1007   HCT 40.9 08/04/2017 1007   PLT 280.0 08/04/2017 1007   MCV 88.0 08/04/2017 1007   MCH 29.5 07/08/2017 0358   MCHC 33.3 08/04/2017 1007   RDW 15.6 (H) 08/04/2017 1007   LYMPHSABS 3.1 08/04/2017 1007   MONOABS 0.8 08/04/2017 1007   EOSABS 0.6 08/04/2017 1007   BASOSABS 0.1 08/04/2017 1007    BMET    Component Value Date/Time   NA 138 08/04/2017 1007   K 3.8 08/04/2017 1007   CL 98 08/04/2017 1007   CO2 31 08/04/2017 1007   GLUCOSE 123 (H) 08/04/2017 1007   BUN 10 08/04/2017 1007   CREATININE 0.88 08/04/2017 1007   CALCIUM 10.3 08/04/2017 1007   GFRNONAA >60 07/07/2017 0356   GFRAA >60 07/07/2017 0356    BNP    Component Value Date/Time   BNP 15.4 07/20/2015 1412    ProBNP    Component Value Date/Time   PROBNP 17.0 08/04/2017 1007    Imaging: No results found.   Assessment & Plan:   OSA on CPAP OSA with suspected OHS.  Download shows when she does wear she has good control.  Advised on good compliance.  Plan  Patient Instructions  Doxycycline 100mg  Twice daily  For 1 week , take with food.  Continue on Lasix 20mg   2 daily .  Continue on Aldactone 25mg  Twice daily  .  Mucinex DM Twice daily  As needed  Cough /congestion  Use oxygen 2l/m  Wear CPAP  At bedtime and with naps.  Work on not smoking . Set a quit date.  Continue on [place]pi[place] and Dulera  Use Albuterol Neb every 4hrs as needed.  Follow up with with  Dr. Sherene Sires  In 3-4 weeks and As needed   Please contact office for sooner follow up if symptoms do not improve or worsen or seek emergency care  Set up for CT chest .          Asthmatic bronchitis Recurrent exacerbation with ongoing smoking Check  CT chest high-resolution to rule out ILD Doxycycline times 1 week for suspected bronchitis  Plan  Patient Instructions  Doxycycline 100mg  Twice daily  For 1 week , take with food.  Continue on Lasix 20mg   2 daily .  Continue on Aldactone 25mg  Twice daily  .  Mucinex DM Twice daily  As needed  Cough /congestion  Use oxygen 2l/m  Wear CPAP  At bedtime and with naps.  Work on not smoking . Set a quit date.  Continue on [place]  Use Albuterol Neb every 4hrs as needed.  Follow up with with Dr. Sherene Sires  In 3-4 weeks and As needed   Please contact office for sooner follow up if symptoms do not improve or worsen or seek emergency care  Set up for CT chest .          Chronic respiratory failure with hypoxia (HCC) Continue on oxygen  Diastolic dysfunction Continue on Lasix and spironolactone.  Bmet is pending  Patient's medications were reviewed today and patient education was given. Computerized medication calendar was adjusted/completed       Rubye Oaks, NP 08/26/2017

## 2017-08-26 NOTE — Patient Instructions (Addendum)
Doxycycline 100mg  Twice daily  For 1 week , take with food.  Continue on Lasix 20mg   2 daily .  Continue on Aldactone 25mg  Twice daily  .  Mucinex DM Twice daily  As needed  Cough /congestion  Use oxygen 2l/m  Wear CPAP  At bedtime and with naps.  Work on not smoking . Set a quit date.  Continue on [place] and Dulera  Use Albuterol Neb every 4hrs as needed.  Follow up with with Dr. Sherene SiresWert  In 3-4 weeks and As needed   Please contact office for sooner follow up if symptoms do not improve or worsen or seek emergency care  Set up for CT chest .

## 2017-08-26 NOTE — Assessment & Plan Note (Signed)
Recurrent exacerbation with ongoing smoking Check CT chest high-resolution to rule out ILD Doxycycline times 1 week for suspected bronchitis  Plan  Patient Instructions  Doxycycline 100mg  Twice daily  For 1 week , take with food.  Continue on Lasix 20mg   2 daily .  Continue on Aldactone 25mg  Twice daily  .  Mucinex DM Twice daily  As needed  Cough /congestion  Use oxygen 2l/m  Wear CPAP  At bedtime and with naps.  Work on not smoking . Set a quit date.  Continue on [place] and Dulera  Use Albuterol Neb every 4hrs as needed.  Follow up with with Dr. Sherene SiresWert  In 3-4 weeks and As needed   Please contact office for sooner follow up if symptoms do not improve or worsen or seek emergency care  Set up for CT chest .

## 2017-08-26 NOTE — Telephone Encounter (Signed)
This form is in Dr. Evlyn CourierSood's CMN folder on his desk for him to sign when he returns to office. The patient last saw Dr. Craige CottaSood 11/04/2015 and he may not sign the form. The patient has seen Dr. Sherene SiresWert and TP this year though

## 2017-08-26 NOTE — Assessment & Plan Note (Signed)
Continue to encourage full cessation. She is working on this, down to 1-2 cig/day.

## 2017-08-26 NOTE — Assessment & Plan Note (Addendum)
Continue celexa with PRN lorazepam. Some anxiety stemming from respiratory issues.

## 2017-08-26 NOTE — Assessment & Plan Note (Signed)
OSA with suspected OHS.  Download shows when she does wear she has good control.  Advised on good compliance.  Plan  Patient Instructions  Doxycycline 100mg  Twice daily  For 1 week , take with food.  Continue on Lasix 20mg   2 daily .  Continue on Aldactone 25mg  Twice daily  .  Mucinex DM Twice daily  As needed  Cough /congestion  Use oxygen 2l/m  Wear CPAP  At bedtime and with naps.  Work on not smoking . Set a quit date.  Continue on [place] and Dulera  Use Albuterol Neb every 4hrs as needed.  Follow up with with Dr. Sherene SiresWert  In 3-4 weeks and As needed   Please contact office for sooner follow up if symptoms do not improve or worsen or seek emergency care  Set up for CT chest .

## 2017-08-26 NOTE — Assessment & Plan Note (Signed)
Now oxygen dependent.  

## 2017-08-26 NOTE — Assessment & Plan Note (Signed)
Continue on oxygen 

## 2017-08-26 NOTE — Assessment & Plan Note (Addendum)
Concern for worsening diastolic CHF, BNP was normal. Now on lasix, spironolactone. Will update BMP. Reviewed latest echo from 07/2015. Consider updated.

## 2017-08-26 NOTE — Assessment & Plan Note (Signed)
Reports compliance with CPAP at night, uses O2 with this.

## 2017-08-26 NOTE — Telephone Encounter (Signed)
Checked both of our fax machines to see if there was anything on them regarding pt and also checked VS's lookat folder to see if there was any forms in there but nothing on all things regarding pt.  Brenda [name], please advise if you have something in your office on pt from [employer] that VS needs to complete on pt in order for pt to receive supplies. Thanks!

## 2017-08-26 NOTE — Assessment & Plan Note (Signed)
Continue on Lasix and spironolactone.  Bmet is pending  Patient's medications were reviewed today and patient education was given. Computerized medication calendar was adjusted/completed

## 2017-08-26 NOTE — Assessment & Plan Note (Addendum)
Oxygen dependent asthmatic bronchitis. Followed closely by pulmonology. Appreciate their care. Has close f/u later this week.

## 2017-08-30 NOTE — Telephone Encounter (Signed)
CMN folder on Dr. Evlyn CourierSood's desk.  VS please advise if okay signing. Thanks!

## 2017-08-31 NOTE — Telephone Encounter (Signed)
We have received another copy of the form from [employer] for this patient's CPAP suppllies. The form is on Dr. Evlyn CourierSood's desk for him to sign when he returns to office nothing else we can do until then

## 2017-08-31 NOTE — Telephone Encounter (Signed)
This form has been signed by Dr. Craige CottaSood and has been faxed to [phone]. I have received confirmation that the fax was received

## 2017-08-31 NOTE — Telephone Encounter (Signed)
Formed signed

## 2017-09-06 NOTE — Addendum Note (Signed)
Addended by: Etheleen MayhewOX, Jaydence Vanyo C on: 09/06/2017 10:46 AM   Modules accepted: Orders

## 2017-09-07 ENCOUNTER — Ambulatory Visit (INDEPENDENT_AMBULATORY_CARE_PROVIDER_SITE_OTHER)
Admission: RE | Admit: 2017-09-07 | Discharge: 2017-09-07 | Disposition: A | Discharge status: Home / Self Care | Payer: BLUE CROSS/BLUE SHIELD | Source: Ambulatory Visit | Attending: Adult Health | Admitting: Adult Health

## 2017-09-07 DIAGNOSIS — R059 Cough, unspecified: Secondary | ICD-10-CM

## 2017-09-07 DIAGNOSIS — R05 Cough: Secondary | ICD-10-CM | POA: Diagnosis not present

## 2017-09-14 ENCOUNTER — Encounter (HOSPITAL_COMMUNITY): Payer: Self-pay

## 2017-09-14 ENCOUNTER — Emergency Department (HOSPITAL_COMMUNITY)
Admission: EM | Admit: 2017-09-14 | Discharge: 2017-09-14 | Disposition: A | Discharge status: Home / Self Care | Payer: BLUE CROSS/BLUE SHIELD | Attending: Emergency Medicine | Admitting: Emergency Medicine

## 2017-09-14 ENCOUNTER — Other Ambulatory Visit: Payer: Self-pay

## 2017-09-14 ENCOUNTER — Emergency Department (HOSPITAL_COMMUNITY): Payer: BLUE CROSS/BLUE SHIELD

## 2017-09-14 DIAGNOSIS — J449 Chronic obstructive pulmonary disease, unspecified: Secondary | ICD-10-CM | POA: Insufficient documentation

## 2017-09-14 DIAGNOSIS — E119 Type 2 diabetes mellitus without complications: Secondary | ICD-10-CM | POA: Diagnosis not present

## 2017-09-14 DIAGNOSIS — Z7984 Long term (current) use of oral hypoglycemic drugs: Secondary | ICD-10-CM | POA: Diagnosis not present

## 2017-09-14 DIAGNOSIS — Y929 Unspecified place or not applicable: Secondary | ICD-10-CM | POA: Insufficient documentation

## 2017-09-14 DIAGNOSIS — Z79899 Other long term (current) drug therapy: Secondary | ICD-10-CM | POA: Insufficient documentation

## 2017-09-14 DIAGNOSIS — I10 Essential (primary) hypertension: Secondary | ICD-10-CM | POA: Insufficient documentation

## 2017-09-14 DIAGNOSIS — Y999 Unspecified external cause status: Secondary | ICD-10-CM | POA: Diagnosis not present

## 2017-09-14 DIAGNOSIS — S8392XA Sprain of unspecified site of left knee, initial encounter: Secondary | ICD-10-CM | POA: Insufficient documentation

## 2017-09-14 DIAGNOSIS — Y939 Activity, unspecified: Secondary | ICD-10-CM | POA: Insufficient documentation

## 2017-09-14 DIAGNOSIS — F17210 Nicotine dependence, cigarettes, uncomplicated: Secondary | ICD-10-CM | POA: Insufficient documentation

## 2017-09-14 DIAGNOSIS — X501XXA Overexertion from prolonged static or awkward postures, initial encounter: Secondary | ICD-10-CM | POA: Insufficient documentation

## 2017-09-14 MED ORDER — HYDROCODONE-ACETAMINOPHEN 5-325 MG PO TABS: 1.0000 | ORAL_TABLET | ORAL | 0 refills | Status: DC | PRN

## 2017-09-14 NOTE — ED Notes (Signed)
Patient transported to X-ray 

## 2017-09-14 NOTE — ED Triage Notes (Signed)
Patient states she fell on 4/11/9  while getting out of the shower. she fell and again on 09/11/17 on a wet floor. Patient states one leg one way and the other leg another. Patient c/o pain left knee pain. Patient states the fall has aggrevated her chronic back pain.

## 2017-09-14 NOTE — ED Provider Notes (Signed)
[facility]-EMERGENCY DEPT Provider Note   CSN: [id number] Arrival date & time: 09/14/17  1438     History   Chief Complaint Chief Complaint  Patient presents with  . Fall  . Knee Pain    left    HPI Brenda [name] is a 57 y.o. female.  The history is provided by the patient.  Fall  This is a new problem. The current episode started yesterday. The problem has been gradually worsening. Nothing aggravates the symptoms. Nothing relieves the symptoms. She has tried nothing for the symptoms. The treatment provided no relief.  Knee Pain    Pt reports she has injured her left knee twice in the last week.  Pt reports she twisted knee.  Pt complains of difficulty walking  Past Medical History:  Diagnosis Date  . Acute pancreatitis   . Asthma    main dyspnea thought due to deconditioning (10/2013)  . Bipolar I disorder, most recent episode (or current) manic, unspecified    pt denies this  . Bronchitis, chronic obstructive (HCC) 2008 & 2009   FeV1 64% TLC 105% DLCO 55% 2008  -  FeV1 81% FeF 25-75 43% 2009  . COPD (chronic obstructive pulmonary disease) (HCC)    emphysema, not an issue per pulm (10/2013)  . History of pyelonephritis 05/2012  . HTN (hypertension)   . Hx of migraines   . Knee pain, right   . Leukocytosis, unspecified   . Lumbar disc disease with radiculopathy    multilevel spondylitic changes, scoliosis, and anterolisthesis L4 not thought to currently be surg candidate (Dr. Phoebe Perch, [facility]) (Dr. Ollen Bowl, [place])  . Nicotine addiction   . Nonspecific abnormal electrocardiogram (ECG) (EKG)   . Obesity   . Pure hyperglyceridemia   . Suicide attempt (HCC) 06/2001  . Swelling of limb   . Urge and stress incontinence 07/2012   ([facility])    Patient Active Problem List   Diagnosis Date Noted  . Diastolic dysfunction 08/26/2017  . Edema 08/04/2017  . Grief 02/24/2017  . Chronic respiratory failure with hypoxia (HCC) 07/31/2015  . Type 2  diabetes, controlled, with peripheral neuropathy (HCC)   . OSA on CPAP 09/14/2014  . Health maintenance examination 09/14/2014  . Upper airway cough syndrome 09/22/2013  . Submandibular lymphadenopathy 08/21/2013  . Chronic pain syndrome 05/30/2013  . Migraine, unspecified, without mention of intractable migraine without mention of status migrainosus 05/30/2013  . Acute sinus infection 06/20/2012  . Pyelonephritis 05/05/2012  . Asthmatic bronchitis 02/27/2008  . Leucocytosis 02/22/2008  . NONSPECIFIC ABNORMAL ELECTROCARDIOGRAM 02/22/2008  . HYPERTENSION, BENIGN ESSENTIAL 06/23/2007  . HYPERTRIGLYCERIDEMIA 06/01/2007  . Obesity, morbid, BMI 50 or higher (HCC) 06/01/2007  . Adjustment disorder with anxiety 06/01/2007  . Cigarette smoker 06/01/2007    Past Surgical History:  Procedure Laterality Date  . CHOLECYSTECTOMY  07/1982  . CT MAXILLOFACIAL WO/W CM  06/14/06   Left max mucocele  . CT of chest  06/14/2006   Normal except c/w active inflammation / infection.  Left renal atrophy  . ERCP / sphincterotomy - stenosis  01/15/06  . lumbar MRI  11/2010   multilevel spondylitic changes with upper lumbar scoliosis and anterolisthesis of L4 on 5 with some L foraminal narrowing esp at L/3 with concavity of scoliosis, some central stenosis and biforaminal narrowing at L4/5 with spondylolisthesis  . [place] - Pneumonia, acute asthma, left maxillary sinusitis  01/11 - 06/18/2006  . Retained stone     ERCP secondary to retained stone  .  Right knee surgery  1984,1989,1989,1991,1994   Reconstructions for ACL insuff  . TUBAL LIGATION       OB History   None      Home Medications    Prior to Admission medications   Medication Sig Start Date End Date Taking? Authorizing Provider  albuterol (PROAIR HFA) 108 (90 Base) MCG/ACT inhaler Inhale 2 puffs into the lungs every 6 (six) hours as needed for wheezing or shortness of breath. 08/26/17   Parrett, Tammy S, NP  albuterol (PROVENTIL) (2.5  MG/3ML) 0.083% nebulizer solution INHALE 1 VIAL VIA NEBULIZER EVERY 6 HOURS AS NEEDED FOR WHEEZING/SHORT BREATH 01/08/17   Eustaquio BoydenGutierrez, Javier, MD  benzonatate (TESSALON) 200 MG capsule Take 1 capsule (200 mg total) by mouth 3 (three) times daily as needed for cough. Swallow whole, to not bite pill 06/29/17   Tower, Audrie GallusMarne A, MD  cetirizine (ZYRTEC) 10 MG tablet Take 10 mg by mouth daily.     [provider]  citalopram (CELEXA) 20 MG tablet Take 1 tablet (20 mg total) daily by mouth. 04/18/17   Eustaquio BoydenGutierrez, Javier, MD  citalopram (CELEXA) 20 MG tablet TAKE 1 TABLET BY MOUTH EVERY DAY 08/20/17   Eustaquio BoydenGutierrez, Javier, MD  fluticasone [facility](FLONASE) 50 MCG/ACT nasal spray SPRAY TWICE INTO EACH NOSTRIL EVERY DAY 06/28/17   Eustaquio BoydenGutierrez, Javier, MD  furosemide (LASIX) 20 MG tablet Take 2 tablets (40 mg total) by mouth daily. 08/26/17   Parrett, Virgel Bouquetammy S, NP  gabapentin (NEURONTIN) 300 MG capsule Take 1 capsule (300 mg total) by mouth 2 (two) times daily. 02/24/17   Eustaquio BoydenGutierrez, Javier, MD  guaiFENesin (MUCINEX) 600 MG 12 hr tablet Take 1 tablet (600 mg total) by mouth 2 (two) times daily. 07/26/15   Drema DallasWoods, Curtis J, MD  HYDROcodone-acetaminophen (NORCO/VICODIN) 5-325 MG tablet Take 1 tablet by mouth every 4 (four) hours as needed. 09/14/17   Elson AreasSofia, Bayler Nehring K, PA-C  ibuprofen (ADVIL) 200 MG tablet Take 400 mg by mouth every 6 (six) hours as needed for headache.    [provider]  LORazepam (ATIVAN) 0.5 MG tablet TAKE 1/2 TO 1 TABLET BY MOUTH TWICE A DAY AS NEEDED FOR ANXIETY. 08/20/17   Eustaquio BoydenGutierrez, Javier, MD  metFORMIN (GLUCOPHAGE) 500 MG tablet TAKE 1 TABLET BY MOUTH EVERYDAY AT BEDTIME 07/21/17   Eustaquio BoydenGutierrez, Javier, MD  mometasone-formoterol [facility](DULERA) 200-5 MCG/ACT AERO Inhale 2 puffs into the lungs 2 (two) times daily. 08/12/17   Nyoka CowdenWert, Michael B, MD  Multiple Vitamin (MULTIVITAMIN WITH MINERALS) TABS tablet Take 1 tablet by mouth daily.    [provider]  nystatin (MYCOSTATIN) 100000 UNIT/ML suspension TAKE  1 TEASPOONFUL BY MOUTH 3 TIMES A DAY FOR 10 DAYS Patient taking differently: TAKE 1 TEASPOONFUL BY MOUTH 3 TIMES DAILY AS NEEDED FOR MOUTH SORES. 05/26/16   Eustaquio BoydenGutierrez, Javier, MD  OXYGEN 2 lpm with sleep with CPAP and during the day with exertion    [provider]  promethazine-dextromethorphan (PROMETHAZINE-DM) 6.25-15 MG/5ML syrup Take 5 mLs by mouth 4 (four) times daily as needed for cough. 06/29/17   Tower, Audrie GallusMarne A, MD  spironolactone (ALDACTONE) 25 MG tablet Take 1 tablet (25 mg total) by mouth 2 (two) times daily. 08/12/17   Nyoka CowdenWert, Michael B, MD  Tiotropium Bromide Monohydrate (SPIRIVA RESPIMAT) 2.5 MCG/ACT AERS Inhale 2 puffs into the lungs daily. 08/12/17   Nyoka CowdenWert, Michael B, MD    Family History Family History  Problem Relation Age of Onset  . Heart disease Brother        MI in  early 11's  . Hypertension Mother   . Hyperlipidemia Mother   . Hypertension Father   . Asthma Father   . Breast cancer Paternal Grandmother     Social History Social History   Tobacco Use  . Smoking status: Current Some Day Smoker    Years: 30.00    Types: Cigarettes  . Smokeless tobacco: Never Used  . Tobacco comment: 4 cig a day  Substance Use Topics  . Alcohol use: No    Alcohol/week: 0.0 oz  . Drug use: No     Allergies   Metolazone   Review of Systems Review of Systems  All other systems reviewed and are negative.    Physical Exam Updated Vital Signs BP (!) 155/86 (BP Location: Left Wrist)   Pulse 89   Temp 98.7 F (37.1 C) (Oral)   Resp 18   Ht 5\' 1"  (1.549 m)   Wt 128.8 kg (284 lb)   LMP 03/08/2013   SpO2 96%   BMI 53.66 kg/m   Physical Exam  Constitutional: She is oriented to person, place, and time. She appears well-developed and well-nourished.  HENT:  Head: Normocephalic.  Eyes: EOM are normal.  Neck: Normal range of motion.  Pulmonary/Chest: Effort normal.  Abdominal: She exhibits no distension.  Musculoskeletal: Normal range of motion. She exhibits  tenderness. She exhibits no deformity.  Neurological: She is alert and oriented to person, place, and time.  Psychiatric: She has a normal mood and affect.  Nursing note and vitals reviewed.    ED Treatments / Results  Labs (all labs ordered are listed, but only abnormal results are displayed) Labs Reviewed - No data to display  EKG None  Radiology Dg Knee Complete 4 Views Left  Result Date: 09/14/2017 CLINICAL DATA:  LEFT knee pain and spasms off and on for 2 years, fall with LEFT knee injury EXAM: LEFT KNEE - COMPLETE 4+ VIEW COMPARISON:  None FINDINGS: Low normal osseous mineralization. Joint spaces preserved. No acute fracture, dislocation, or bone destruction. No knee joint effusion. IMPRESSION: No acute abnormalities. Electronically Signed   By: Ulyses Southward M.D.   On: 09/14/2017 15:36    Procedures Procedures (including critical care time)  Medications Ordered in ED Medications - No data to display   Initial Impression / Assessment and Plan / ED Course  I have reviewed the triage vital signs and the nursing notes.  Pertinent labs & imaging results that were available during my care of the patient were reviewed by me and considered in my medical decision making (see chart for details).     MDM  I suspect strain.  Pt placed in a knee imbolizer.   Pt has seen Dr. Lajoyce Corners in the past.  Pt is advised to see him for reevaluation Meds ordered this encounter  Medications  . HYDROcodone-acetaminophen (NORCO/VICODIN) 5-325 MG tablet    Sig: Take 1 tablet by mouth every 4 (four) hours as needed.    Dispense:  16 tablet    Refill:  0    Order Specific Question:   Supervising Provider    Answer:   Eber Hong [[id number]]  An After Visit Summary was printed and given to the patient.   Final Clinical Impressions(s) / ED Diagnoses   Final diagnoses:  Sprain of left knee, unspecified ligament, initial encounter    ED Discharge Orders        Ordered    HYDROcodone-acetaminophen  (NORCO/VICODIN) 5-325 MG tablet  Every 4 hours PRN  09/14/17 1605       Elson Areas, PA-C 09/14/17 1729    Nira Conn, MD 09/16/17 1128

## 2017-09-16 ENCOUNTER — Encounter: Payer: Self-pay | Admitting: *Deleted

## 2017-09-16 ENCOUNTER — Encounter: Payer: Self-pay | Admitting: Internal Medicine

## 2017-09-16 ENCOUNTER — Ambulatory Visit: Payer: BLUE CROSS/BLUE SHIELD | Admitting: Internal Medicine

## 2017-09-16 VITALS — BP 132/84 | HR 94 | Ht 61.0 in | Wt 278.0 lb

## 2017-09-16 DIAGNOSIS — F17210 Nicotine dependence, cigarettes, uncomplicated: Secondary | ICD-10-CM | POA: Diagnosis not present

## 2017-09-16 DIAGNOSIS — J4530 Mild persistent asthma, uncomplicated: Secondary | ICD-10-CM | POA: Diagnosis not present

## 2017-09-16 DIAGNOSIS — R058 Other specified cough: Secondary | ICD-10-CM

## 2017-09-16 DIAGNOSIS — J9611 Chronic respiratory failure with hypoxia: Secondary | ICD-10-CM | POA: Diagnosis not present

## 2017-09-16 DIAGNOSIS — I2781 Cor pulmonale (chronic): Secondary | ICD-10-CM | POA: Diagnosis not present

## 2017-09-16 DIAGNOSIS — R05 Cough: Secondary | ICD-10-CM | POA: Diagnosis not present

## 2017-09-16 MED ORDER — PANTOPRAZOLE SODIUM 40 MG PO TBEC: 40.0000 mg | DELAYED_RELEASE_TABLET | Freq: Every day | ORAL | 2 refills | Status: DC

## 2017-09-16 MED ORDER — FAMOTIDINE 20 MG PO TABS: ORAL_TABLET | ORAL | 11 refills | Status: DC

## 2017-09-16 NOTE — Assessment & Plan Note (Signed)
-   stopped all dpi's 09/22/2013 and cozar - 09/22/2013 added flutter valve - 10/06/13 add gerd rx > resolved 11/17/2013  - gerd rx added 09/16/2017   Of the three most common causes of  Sub-acute / recurrent or chronic cough, only one (GERD)  can actually contribute to/ trigger  the other two (asthma and post nasal drip syndrome)  and perpetuate the cylce of cough.  While not intuitively obvious, many patients with chronic low grade reflux do not cough until there is a primary insult that disturbs the protective epithelial barrier and exposes sensitive nerve endings.   This is typically viral but can due to PNDS and  either may apply here.   The point is that once this occurs, it is difficult to eliminate the cycle  using anything but a maximally effective acid suppression regimen at least in the short run, accompanied by an appropriate diet to address non acid GERD and control / eliminate the cough itself for at least 3 days.

## 2017-09-16 NOTE — Progress Notes (Signed)
Subjective:   Patient ID: Brenda [name], female    DOB: 19-Oct-1960    MRN: [id number]   Brief patient profile:  57  yowf quit smoking 07/2015  at wt 250 and admitted with" aecopd" but with pfts nearly nl 2/29/15 p rx so characterized as AB not copd    Admit date: 05/30/2013  Discharge date: 06/02/2013  HYPERTRIGLYCERIDEMIA  LEUKOCYTOSIS UNSPECIFIED  BIPOLAR AFFECTIVE DISORDER, MANIC  NICOTINE ADDICTION  HYPERTENSION, BENIGN ESSENTIAL  BRONCHITIS, OBSTRUCTIVE CHRONIC  EMPHYSEMA  REACTIVE AIRWAY DISEASE  COPD exacerbation  Chronic pain syndrome  Migraine, unspecified, without mention of intractable migraine without mention of status migrainosus   History of Present Illness  09/22/2013 1st [facility] office visit/ Wert  Chief Complaint  Patient presents with  . Pulmonary Consult    [facility]. Pt c/o SOB since Dec 2014, but has been worse since Jan 2015. She reports that she gets out of breath walking just a few steps and wakes up out of breath 3-4 times every night. She also c/o cough that she states started "all the time"- non prod.   onset was abrupt but pattern has been progressively worse with minimal transient benefit from prednisone for up to week then back on again and attributes wt gain to steroids x around 50 lbs.  Cough is harsh barking. Sob no better from albterol, says xopenex works better   rec Stop advair, cozar and spiriva  xopenex 1.25 mg 4 x daily Prilosec 20 mg x 2 =  40 mg   Take 30-60 min before first meal of the day and Pepcid 20 mg one bedtime until return to office - this is the best way to tell whether stomach acid is contributing to your problem.  GERD diet  Diovan (losartan)160 one daily is your new blood pressure pill  For cough > use flutter valve and Take delsym two tsp every 12 hours and supplement if needed with  tramadol 50 mg up to 2 every 4 hours to suppress the urge to cough    03/25/15  rec Stop Dulera  Restart  Symbicort 2 puffs Twice daily   Restart Spiriva 1 puff daily .  Work on not smoking.  Keep up good work with CPAP At bedtime     Admit date: 07/20/2015 Discharge date: 07/26/2015 Recommendations for Outpatient Follow-up:  Acute hypoxic respiratory failure - acute COPD exacerbation No focal infiltrate on chest x-ray to suggest pneumonia - influenza panel negative - cont usual medical tx - given her decline this morning, and the fact that she would be an exceptionally difficult intubation, I have asked PCCM to see her in consultation (she is followed in the office by Dr. Craige CottaSood) -Pulmicort nebs BID; will change over to inhaler in a.m. -Atrovent + Albuterol inhaler QID -DC Solu-Medrol; start prednisone 20 mg daily  -Titrate O2 to maintain SPO2 89 and 93% -Ambulatory SPO2 -PT/OT recommend no follow-up -Follow-up in one week with Dr. Craige CottaSood or Dr[facility]ert [facility] acute respiratory failure with hypoxia 2dary COPD exacerbation -Instructed to restart Spiriva  -Patient was on Symbicort 160-4.5 g BID. Instructed to restart  SATURATION QUALIFICATIONS: (This note is used to comply with regulatory documentation for home oxygen) Patient Saturations on Room Air at Rest = 88% Patient Saturations on Room Air while Ambulating =85% Patient Saturations on 3Liters of oxygen while Ambulating = 92%  Please briefly explain why patient needs home oxygen: -Patient instructed to use 3 L O2 via [place] 24 hours  a day.  Hypertension -Blood pressure currently well controlled, will not restart previous BP medication  Lipomatous Hpertrophy Iteratrial Septum  -No action required in less patient begins to have arrhythmias.  Tobacco abuse -Counsel on absolute need to stop smoking permanently  -Schedule appointment for follow-up in 1- 2 weeks with Dr.Javier Sharen Hones for acute respiratory failure, tobacco abuse, OSA  Obstructive sleep apnea -CPAP daily at bedtime - followed by Dr. Craige Cotta  Morbid obesity - Body mass  index is 49.3 kg/(m^2).   Bipolar disorder -On admission patient not on medication  Diabetes type 2 with complications -Not sure patient is true diabetic, most likely iatrogenic secondary to steroids -2/23 hemoglobin A1c = 7.2  -On admission patient was not on diabetic medication will start patient on metformin 500 mg BID, and let her PCP titrate to effect  Hyperkalemia -Resolved    07/30/2015  f/u ov/Wert re:  AB / quit smoking 07/17/2015  Chief Complaint  Patient presents with  . HFU    Pt states that her breathing has improved some. She c/o nasal congestion and ears feeling stopped up.  She has not had bloody nasal d/c.   Last time could walk into work one year prior to OV   Using 02 nasal and cpap f/u sood for June 2017  Feels 02 is irritating nose/ causing obstruction  rec Congratulations on not smoking, it's the most important aspect of your care No change on your medications = symbicort and spiriva automatically every day Work on maintaining perfect  inhaler technique: Only use your albuterol as a rescue medication  Plug 02 into your cpap machine but no need to wear it otherwise  Keep appt to see Dr Craige Cotta with pfts as planned - call us sooner if needed       08/04/17 NP eval rec Stop Hydrochlorothiazide .  Begin Lasix 20mg  daily -2 daily for 3 days then one daily  Labs today  Mucinex DM Twice daily  As needed  Cough /congestion  Use oxygen 2l/m with activity and At bedtime   Try wear the CPAP  At bedtime  At least 4 hr each night.  Work on not smoking .  Continue on BREO daily , rinse after use.  Continue Duoneb Three times a day  .     08/12/2017 acute extended ov/Wert re: AB/ peripheral edema  Chief Complaint  Patient presents with  . Acute Visit    Cough and SOB are not improving. She is coughing with yellow sputum.  She uses her proair once daily on average and Duoneb 3 x daily.    never got completely free of cigs but started smoking a lot more p husband  passed in July 2018 from lung ca from smoking Baseline sob across parking stops half way s 02  And only wearing 02 with cpap Now doe = MMRC4  = sob if tries to leave home or while getting dressed / not sure 02 helps  rec Continue lasix is 2 daily until better then 1 daily  Aldactone 25 mg one twice daily  Prednisone 10 mg take  4 each am x 2 days,   2 each am x 2 days,  1 each am x 2 days and stop  Stop breo and respiclick Plan A = Automatic = dulera 200 Take 2 puffs first thing in am and chase spiriva 2 pffs and then another 2 puffs  dulera about 12 hours later.  Plan B = Backup Only use your albuterol inhaler (  not the powder/resplick)  as a rescue medication  Plan C = Crisis - only use your albuterol nebulizer if you first try Plan B     09/16/2017  Extended f/u ov/Wert re: copd GOLD 0 still smoking but overall some better  Chief Complaint  Patient presents with  . Follow-up    Breathing has improved some but not back to her normal baseline. She is using her proair and albuterol neb both 2 x daily on average. She did not bring med cal or meds to appt today.   Dyspnea:  Improved but still MMRC3 = can't walk 100 yards even at a slow pace at a flat grade s stopping due to sob  - can do [employer] on 3lpm  Cough: better but more after supper and dry quality  Sleep: ok on cpap 30 degrees  SABA use: too   Last worked Jun 29 2017 seeking full disability    No obvious day to day or daytime variability or assoc excess/ purulent sputum or mucus plugs or hemoptysis or cp or chest tightness, subjective wheeze or overt sinus or hb symptoms. No unusual exposure hx or h/o childhood pna/ asthma or knowledge of premature birth.  Sleeping  30 degrees cpap   without nocturnal  or early am exacerbation  of respiratory  c/o's or need for noct saba. Also denies any obvious fluctuation of symptoms with weather or environmental changes or other aggravating or alleviating factors except as outlined above    Current Allergies, Complete Past Medical History, Past Surgical History, Family History, and Social History were reviewed in [employer] record.  ROS  The following are not active complaints unless bolded Hoarseness, sore throat, dysphagia, dental problems, itching, sneezing,  nasal congestion or discharge of excess mucus or purulent secretions, ear ache,   fever, chills, sweats, unintended wt loss or wt gain, classically pleuritic or exertional cp,  orthopnea pnd or arm/hand swelling  or leg swelling much better  presyncope, palpitations, abdominal pain, anorexia, nausea, vomiting, diarrhea  or change in bowel habits or change in bladder habits, change in stools or change in urine, dysuria, hematuria,  rash, arthralgias, visual complaints, headache, numbness, weakness or ataxia or problems with walking or coordination,  change in mood or  memory.        Current Meds  Medication Sig  . albuterol (PROAIR HFA) 108 (90 Base) MCG/ACT inhaler Inhale 2 puffs into the lungs every 6 (six) hours as needed for wheezing or shortness of breath.  Marland Kitchen albuterol (PROVENTIL) (2.5 MG/3ML) 0.083% nebulizer solution INHALE 1 VIAL VIA NEBULIZER EVERY 6 HOURS AS NEEDED FOR WHEEZING/SHORT BREATH  . benzonatate (TESSALON) 200 MG capsule Take 1 capsule (200 mg total) by mouth 3 (three) times daily as needed for cough. Swallow whole, to not bite pill  . cetirizine (ZYRTEC) 10 MG tablet Take 10 mg by mouth daily.   . citalopram (CELEXA) 20 MG tablet TAKE 1 TABLET BY MOUTH EVERY DAY  . fluticasone (FLONASE) 50 MCG/ACT nasal spray SPRAY TWICE INTO EACH NOSTRIL EVERY DAY  . furosemide (LASIX) 20 MG tablet Take 2 tablets (40 mg total) by mouth daily.  Marland Kitchen gabapentin (NEURONTIN) 300 MG capsule Take 1 capsule (300 mg total) by mouth 2 (two) times daily.  Marland Kitchen guaiFENesin (MUCINEX) 600 MG 12 hr tablet Take 1 tablet (600 mg total) by mouth 2 (two) times daily.  Marland Kitchen HYDROcodone-acetaminophen (NORCO/VICODIN) 5-325 MG  tablet Take 1 tablet by mouth every 4 (four) hours as needed.  Marland Kitchen  ibuprofen (ADVIL) 200 MG tablet Take 400 mg by mouth every 6 (six) hours as needed for headache.  Marland Kitchen LORazepam (ATIVAN) 0.5 MG tablet TAKE 1/2 TO 1 TABLET BY MOUTH TWICE A DAY AS NEEDED FOR ANXIETY.  . metFORMIN (GLUCOPHAGE) 500 MG tablet TAKE 1 TABLET BY MOUTH EVERYDAY AT BEDTIME  . mometasone-formoterol (DULERA) 200-5 MCG/ACT AERO Inhale 2 puffs into the lungs 2 (two) times daily.  . Multiple Vitamin (MULTIVITAMIN WITH MINERALS) TABS tablet Take 1 tablet by mouth daily.  Marland Kitchen nystatin (MYCOSTATIN) 100000 UNIT/ML suspension TAKE 1 TEASPOONFUL BY MOUTH 3 TIMES A DAY FOR 10 DAYS (Patient taking differently: TAKE 1 TEASPOONFUL BY MOUTH 3 TIMES DAILY AS NEEDED FOR MOUTH SORES.)  . OXYGEN 2 lpm with sleep with CPAP and during the day with exertion  . promethazine-dextromethorphan (PROMETHAZINE-DM) 6.25-15 MG/5ML syrup Take 5 mLs by mouth 4 (four) times daily as needed for cough.  Marland Kitchen spironolactone (ALDACTONE) 25 MG tablet Take 1 tablet (25 mg total) by mouth 2 (two) times daily.  . Tiotropium Bromide Monohydrate (SPIRIVA RESPIMAT) 2.5 MCG/ACT AERS Inhale 2 puffs into the lungs daily.             Objective:   Physical Exam  amb obese slt hoarse wf with dry sounding cough   10/06/2013         288 > 11/17/2013  288> 07/30/2015  266  > 08/12/2017   284 > 09/16/2017  278     09/22/13 293 lb 12.8 oz (133.267 kg)  09/18/13 289 lb 4 oz (131.203 kg)  08/21/13 283 lb 8 oz (128.595 kg)     Vital signs reviewed - Note on arrival 02 sats  97% on 2lpm       HEENT: nl  oropharynx. Nl external ear canals without cough reflex - top denturess/ mild bilateral non-specific turbinate edema / Modified Mallampati Score =  3/4     NECK :  without JVD/Nodes/TM/ nl carotid upstrokes bilaterally   LUNGS: no acc muscle use,  Nl contour chest distant bs bilaterally  without cough on insp or exp maneuvers   CV:  RRR  no s3 or murmur or increase in P2, and  no significant LE  edema   ABD:  soft and nontender with nl inspiratory excursion in the supine position. No bruits or organomegaly appreciated, bowel sounds nl  MS:  Nl gait/ ext warm without deformities, calf tenderness, cyanosis or clubbing Wearing a L knee brace   SKIN: warm and dry without lesions    NEURO:  alert, approp, nl sensorium with  no motor or cerebellar deficits apparent.             I personally reviewed images and agree with radiology impression as follows:   Chest CT  09/08/17  1. No findings to suggest interstitial lung disease. 2. Mild air trapping indicative of mild small airways disease. 3. Areas of mild linear scarring and/or subsegmental atelectasis in the lung bases bilaterally. 4. Aortic atherosclerosis, in addition to 2 vessel coronary artery disease. Please note that although the presence of coronary artery calcium documents the presence of coronary artery disease, the severity of this disease and any potential stenosis cannot be assessed on this non-gated CT examination. Assessment for potential risk factor modification, dietary therapy or pharmacologic therapy may be warranted, if clinically indicated. 5. Severe hepatic steatosis.            Assessment & Plan:

## 2017-09-16 NOTE — Assessment & Plan Note (Signed)
07/30/2015   Walked RA  2 laps @ 185 ft each stopped due to  Sob with sats still 90% so rec 02 hs only plugged into cpap machine  - as of 09/16/2017  02 = 2lpm at rest and 3lpm with activity as may be developing cor pulmonale

## 2017-09-16 NOTE — Assessment & Plan Note (Signed)
Spirometry 2/9 2015:    FEV1 1.45 (58%) ratio 71 pre B2 with min response to saba  - 10/06/2013  try dulera 100  2bid > improved - quit smoking 07/17/15 > resumed July 2018  - 08/12/2017  changed breo/respiclick to dulera 200/ spiriva smi/ saba hfa due to cough  - HRCT chest 08/2017 >neg ILD , mild scarring/atx.  - 09/16/2017  After extensive coaching inhaler device  effectiveness =    90%   Symptoms remain difficult to control: DDX of  difficult airways management almost all start with A and  include Adherence, Ace Inhibitors, Acid Reflux, Active Sinus Disease, Alpha 1 Antitripsin deficiency, Anxiety masquerading as Airways dz,  ABPA,  Allergy(esp in young), Aspiration (esp in elderly), Adverse effects of meds,  Active smokers, A bunch of PE's (a small clot burden can't cause this syndrome unless there is already severe underlying pulm or vascular dz with poor reserve) plus two Bs  = Bronchiectasis and Beta blocker use..and one C= CHF   Adherence is always the initial "prime suspect" and is a multilayered concern that requires a "trust but verify" approach in every patient - starting with knowing how to use medications, especially inhalers, correctly, keeping up with refills and understanding the fundamental difference between maintenance and prns vs those medications only taken for a very short course and then stopped and not refilled.  - see hfa teaching  - Each maintenance medication was reviewed in detail including most importantly the difference between maintenance and as needed and under what circumstances the prns are to be used. This was done in the context of a medication calendar review which provided the patient with a user-friendly unambiguous mechanism for medication administration and reconciliation and provides an action plan for all active problems. It is critical that this be shown to every doctor  for modification during the office visit if necessary so the patient can use it as a working  document.    Active smoking great concern (see separate a/p)   ? Acid (or non-acid) GERD > always difficult to exclude as up to 75% of pts in some series report no assoc GI/ Heartburn symptoms> rec add  max (24h)  acid suppression and diet restrictions/ reviewed and instructions given in writing.   ? Anxiety/depression > usually at the bottom of this list of usual suspects but should be  higher on this pt's based on H and P and note already on psychotropics and may interfere with adherence and also interpretation of response or lack thereof to symptom management which can be quite subjective.   ? Active sinus dz > consider sinus ct if cough continues    I had an extended discussion with the patient reviewing all relevant studies completed to date and  lasting 25 minutes of a 40  minute extended office  visit  Addressing  non-specific but potentially very serious refractory respiratory symptoms of uncertain and potentially multiple  etiologies.   Each maintenance medication was reviewed in detail including most importantly the difference between maintenance and prns and under what circumstances the prns are to be triggered using an action plan format that is not reflected in the computer generated alphabetically organized AVS.    Please see AVS for specific instructions unique to this office visit that I personally wrote and verbalized to the the pt in detail and then reviewed with pt  by my nurse highlighting any changes in therapy/plan of care  recommended at today's visit.

## 2017-09-16 NOTE — Assessment & Plan Note (Signed)

## 2017-09-16 NOTE — Assessment & Plan Note (Signed)
Body mass index is 52.53 kg/m.  -  trending down Lab Results  Component Value Date   TSH 2.27 07/24/2016     Contributing to gerd risk/ doe/reviewed the need and the process to achieve and maintain neg calorie balance > defer f/u primary care including intermittently monitoring thyroid status

## 2017-09-16 NOTE — Assessment & Plan Note (Addendum)
Clinical dx only as of  08/04/17 with BNP only 17  > improved on spironolactone as of 09/16/2017   Since bnp is so low and response to spironolactone so complete no need to do echo at this point as would be TDS likely anyway

## 2017-09-16 NOTE — Patient Instructions (Addendum)
Pantoprazole (protonix) 40 mg   Take  30-60 min before first meal of the day and Pepcid (famotidine)  20 mg one @  bedtime until return to office - this is the best way to tell whether stomach acid is contributing to your problem.   GERD (REFLUX)  is an extremely common cause of respiratory symptoms just like yours , many times with no obvious heartburn at all.    It can be treated with medication, but also with lifestyle changes including elevation of the head of your bed (ideally with 6 inch  bed blocks),  Smoking cessation, avoidance of late meals, excessive alcohol, and avoid fatty foods, chocolate, peppermint, colas, red wine, and acidic juices such as orange juice.  NO MINT OR MENTHOL PRODUCTS SO NO COUGH DROPS  USE SUGARLESS CANDY INSTEAD (Jolley ranchers or Stover's or Life Savers) or even ice chips will also do - the key is to swallow to prevent all throat clearing. NO OIL BASED VITAMINS - use powdered substitutes.    See calendar for specific medication instructions and bring it back for each and every office visit for every healthcare provider you see.  Without it,  you may not receive the best quality medical care that we feel you deserve.  You will note that the calendar groups together  your maintenance  medications that are timed at particular times of the day.  Think of this as your checklist for what your doctor has instructed you to do until your next evaluation to see what benefit  there is  to staying on a consistent group of medications intended to keep you well.  The other group at the bottom is entirely up to you to use as you see fit  for specific symptoms that may arise between visits that require you to treat them on an as needed basis.  Think of this as your action plan or "what if" list.   Separating the top medications from the bottom group is fundamental to providing you adequate care going forward.     Please schedule a follow up office visit in 4 weeks, sooner if  needed with PFTs on return

## 2017-09-19 ENCOUNTER — Other Ambulatory Visit: Payer: Self-pay | Admitting: Family Medicine

## 2017-09-20 ENCOUNTER — Other Ambulatory Visit: Payer: Self-pay | Admitting: Family Medicine

## 2017-09-20 NOTE — Telephone Encounter (Signed)
Electronic refill request Last office visit 08/25/17 Last refill 01/08/17  150 ml/3 Patient is followed by Dr. Sherene SiresWert

## 2017-10-06 ENCOUNTER — Ambulatory Visit (INDEPENDENT_AMBULATORY_CARE_PROVIDER_SITE_OTHER): Payer: Self-pay | Admitting: Orthopedic Surgery

## 2017-10-11 ENCOUNTER — Ambulatory Visit (INDEPENDENT_AMBULATORY_CARE_PROVIDER_SITE_OTHER): Payer: Self-pay | Admitting: Orthopedic Surgery

## 2017-10-11 ENCOUNTER — Other Ambulatory Visit: Payer: Self-pay | Admitting: Neurosurgery

## 2017-10-11 DIAGNOSIS — M4316 Spondylolisthesis, lumbar region: Secondary | ICD-10-CM

## 2017-10-12 ENCOUNTER — Other Ambulatory Visit: Payer: Self-pay | Admitting: Family Medicine

## 2017-10-20 ENCOUNTER — Ambulatory Visit (INDEPENDENT_AMBULATORY_CARE_PROVIDER_SITE_OTHER): Payer: BLUE CROSS/BLUE SHIELD | Admitting: Internal Medicine

## 2017-10-20 ENCOUNTER — Ambulatory Visit: Payer: BLUE CROSS/BLUE SHIELD | Admitting: Internal Medicine

## 2017-10-20 ENCOUNTER — Encounter: Payer: Self-pay | Admitting: Internal Medicine

## 2017-10-20 ENCOUNTER — Encounter: Payer: Self-pay | Admitting: *Deleted

## 2017-10-20 VITALS — BP 112/70 | HR 87 | Ht 61.0 in | Wt 276.0 lb

## 2017-10-20 DIAGNOSIS — F17210 Nicotine dependence, cigarettes, uncomplicated: Secondary | ICD-10-CM | POA: Diagnosis not present

## 2017-10-20 DIAGNOSIS — J441 Chronic obstructive pulmonary disease with (acute) exacerbation: Secondary | ICD-10-CM | POA: Insufficient documentation

## 2017-10-20 DIAGNOSIS — J9611 Chronic respiratory failure with hypoxia: Secondary | ICD-10-CM | POA: Diagnosis not present

## 2017-10-20 DIAGNOSIS — R058 Other specified cough: Secondary | ICD-10-CM

## 2017-10-20 DIAGNOSIS — J449 Chronic obstructive pulmonary disease, unspecified: Secondary | ICD-10-CM | POA: Diagnosis not present

## 2017-10-20 DIAGNOSIS — R05 Cough: Secondary | ICD-10-CM | POA: Diagnosis not present

## 2017-10-20 LAB — PULMONARY FUNCTION TEST
DL/VA % pred: 94 %
DL/VA: 4.15 ml/min/mmHg/L
DLCO unc % pred: 78 %
DLCO unc: 15.97 ml/min/mmHg
FEF 25-75 Post: 0.8 L/sec
FEF 25-75 Pre: 0.48 L/sec
FEF2575-%Change-Post: 66 %
FEF2575-%Pred-Post: 36 %
FEF2575-%Pred-Pre: 21 %
FEV1-%Change-Post: 14 %
FEV1-%Pred-Post: 50 %
FEV1-%Pred-Pre: 44 %
FEV1-Post: 1.16 L
FEV1-Pre: 1.02 L
FEV1FVC-%Change-Post: -6 %
FEV1FVC-%Pred-Pre: 79 %
FEV6-%Change-Post: 17 %
FEV6-%Pred-Post: 67 %
FEV6-%Pred-Pre: 57 %
FEV6-Post: 1.93 L
FEV6-Pre: 1.64 L
FEV6FVC-%Change-Post: -3 %
FEV6FVC-%Pred-Post: 100 %
FEV6FVC-%Pred-Pre: 103 %
FVC-%Change-Post: 21 %
FVC-%Pred-Post: 67 %
FVC-%Pred-Pre: 55 %
FVC-Post: 2 L
FVC-Pre: 1.64 L
Post FEV1/FVC ratio: 58 %
Post FEV6/FVC ratio: 97 %
Pre FEV1/FVC ratio: 62 %
Pre FEV6/FVC Ratio: 100 %
RV % pred: 148 %
RV: 2.7 L
TLC % pred: 110 %
TLC: 5.08 L

## 2017-10-20 NOTE — Progress Notes (Signed)
Subjective:   Patient ID: Brenda [name], female    DOB: [birth year]    MRN: [id number]   Brief patient profile:  33  yowf  Active smoker wt  07/2015 = 250 and admitted with" aecopd" but with pfts nearly nl 2/29/15 p rx so was characterized as GOLD 0/ AB       History of Present Illness  08/12/2017 acute extended ov/Yaris Ferrell re: AB/ peripheral edema  Chief Complaint  Patient presents with  . Acute Visit    Cough and SOB are not improving. She is coughing with yellow sputum.  She uses her proair once daily on average and Duoneb 3 x daily.    never got completely free of cigs but started smoking a lot more p husband passed in July 2018 from lung ca from smoking Baseline sob across parking stops half way s 02  And only wearing 02 with cpap Now doe = MMRC4  = sob if tries to leave home or while getting dressed / not sure 02 helps  rec Continue lasix is 2 daily until better then 1 daily  Aldactone 25 mg one twice daily  Prednisone 10 mg take  4 each am x 2 days,   2 each am x 2 days,  1 each am x 2 days and stop  Stop breo and respiclick Plan A = Automatic = dulera 200 Take 2 puffs first thing in am and chase spiriva 2 pffs and then another 2 puffs  dulera about 12 hours later.  Plan B = Backup Only use your albuterol inhaler (not the powder/resplick)  as a rescue medication  Plan C = Crisis - only use your albuterol nebulizer if you first try Plan B     09/16/2017  Extended f/u ov/Bessie Livingood re: copd GOLD 0 still smoking but overall some better  Chief Complaint  Patient presents with  . Follow-up    Breathing has improved some but not back to her normal baseline. She is using her proair and albuterol neb both 2 x daily on average. She did not bring med cal or meds to appt today.   Dyspnea:  Improved but still MMRC3 = can't walk 100 yards even at a slow pace at a flat grade s stopping due to sob  - can do [employer] on 3lpm  Cough: better but more after supper and dry quality  Sleep: ok on cpap 30  degrees  Last worked Jun 29 2017 seeking full disability   rec Pantoprazole (protonix) 40 mg   Take  30-60 min before first meal of the day and Pepcid (famotidine)  20 mg one @  bedtime until return to office - this is the best way to tell whether stomach acid is contributing to your problem.  GERD  diet   10/20/2017  f/u ov/Kateline Kinkade re:  A B/  still smoking  And now meets criteria for GOLD II maint on dulera/spiriva  Chief Complaint  Patient presents with  . Follow-up    Pt had full PFT prior to OV today. Pt has SOB with exertion, productive cough-yellow, wheezing, some chest tightness. Pt is on 2 liters of O2 con't.  Dyspnea:  [employer] struggle on 3lpm  Cough: better Sleep: cpap/ 30 degrees SABA use:  Needing less    No obvious day to day or daytime variability or assoc excess/ purulent sputum or mucus plugs or hemoptysis or cp or chest tightness, subjective wheeze or overt sinus or hb symptoms. No unusual exposure  hx or h/o childhood pna/ asthma or knowledge of premature birth.  Sleeping  Cpap/30 degrees/ 3lpm   without nocturnal  or early am exacerbation  of respiratory  c/o's or need for noct saba. Also denies any obvious fluctuation of symptoms with weather or environmental changes or other aggravating or alleviating factors except as outlined above   Current Allergies, Complete Past Medical History, Past Surgical History, Family History, and Social History were reviewed in [employer] record.  ROS  The following are not active complaints unless bolded Hoarseness, sore throat, dysphagia, dental problems, itching, sneezing,  nasal congestion or discharge of excess mucus or purulent secretions, ear ache,   fever, chills, sweats, unintended wt loss or wt gain, classically pleuritic or exertional cp,  orthopnea pnd or arm/hand swelling  or leg swelling, presyncope, palpitations, abdominal pain, anorexia, nausea, vomiting, diarrhea  or change in bowel habits or change in  bladder habits, change in stools or change in urine, dysuria, hematuria,  rash, arthralgias, visual complaints, headache, numbness, weakness or ataxia or problems with walking or coordination,  change in mood or  memory.        Current Meds  Medication Sig  . albuterol (PROAIR HFA) 108 (90 Base) MCG/ACT inhaler Inhale 2 puffs into the lungs every 6 (six) hours as needed for wheezing or shortness of breath.  Marland Kitchen albuterol (PROVENTIL) (2.5 MG/3ML) 0.083% nebulizer solution INHALE 1 VIAL VIA NEBULIZER EVERY 6 HOURS AS NEEDED FOR WHEEZING/SHORT BREATH  . benzonatate (TESSALON) 200 MG capsule Take 1 capsule (200 mg total) by mouth 3 (three) times daily as needed for cough. Swallow whole, to not bite pill  . cetirizine (ZYRTEC) 10 MG tablet Take 10 mg by mouth daily.   . citalopram (CELEXA) 20 MG tablet TAKE 1 TABLET BY MOUTH EVERY DAY  . famotidine (PEPCID) 20 MG tablet One at bedtime  . fluticasone (FLONASE) 50 MCG/ACT nasal spray USE 2 SPRAYS INTO EACH NOSTRIL EVERY DAY  . furosemide (LASIX) 20 MG tablet Take 2 tablets (40 mg total) by mouth daily.  Marland Kitchen gabapentin (NEURONTIN) 300 MG capsule Take 1 capsule (300 mg total) by mouth 2 (two) times daily.  Marland Kitchen guaiFENesin (MUCINEX) 600 MG 12 hr tablet Take 1 tablet (600 mg total) by mouth 2 (two) times daily.  Marland Kitchen HYDROcodone-acetaminophen (NORCO/VICODIN) 5-325 MG tablet Take 1 tablet by mouth every 4 (four) hours as needed.  Marland Kitchen ibuprofen (ADVIL) 200 MG tablet Take 400 mg by mouth every 6 (six) hours as needed for headache.  Marland Kitchen LORazepam (ATIVAN) 0.5 MG tablet TAKE 1/2 TO 1 TABLET BY MOUTH TWICE A DAY AS NEEDED FOR ANXIETY.  . metFORMIN (GLUCOPHAGE) 500 MG tablet TAKE 1 TABLET BY MOUTH EVERYDAY AT BEDTIME  . mometasone-formoterol (DULERA) 200-5 MCG/ACT AERO Inhale 2 puffs into the lungs 2 (two) times daily.  . Multiple Vitamin (MULTIVITAMIN WITH MINERALS) TABS tablet Take 1 tablet by mouth daily.  Marland Kitchen nystatin (MYCOSTATIN) 100000 UNIT/ML suspension TAKE 1  TEASPOONFUL BY MOUTH 3 TIMES A DAY FOR 10 DAYS (Patient taking differently: TAKE 1 TEASPOONFUL BY MOUTH 3 TIMES DAILY AS NEEDED FOR MOUTH SORES.)  . OXYGEN 2 lpm with sleep with CPAP and during the day with exertion  . pantoprazole (PROTONIX) 40 MG tablet Take 1 tablet (40 mg total) by mouth daily. Take 30-60 min before first meal of the day  . promethazine-dextromethorphan (PROMETHAZINE-DM) 6.25-15 MG/5ML syrup Take 5 mLs by mouth 4 (four) times daily as needed for cough.  Marland Kitchen spironolactone (ALDACTONE) 25  MG tablet Take 1 tablet (25 mg total) by mouth 2 (two) times daily.  . Tiotropium Bromide Monohydrate (SPIRIVA RESPIMAT) 2.5 MCG/ACT AERS Inhale 2 puffs into the lungs daily.                Objective:   Physical Exam  amb obese wf no longer shrill cough   10/06/2013         288 > 11/17/2013  288> 07/30/2015  266  > 08/12/2017   284 > 09/16/2017  278     09/22/13 293 lb 12.8 oz (133.267 kg)  09/18/13 289 lb 4 oz (131.203 kg)  08/21/13 283 lb 8 oz (128.595 kg)     Vital signs reviewed - Note on arrival 02 sats  95 % on 2lpm continuous  Up to 3lpm wth activity     HEENT: nl  turbinates bilaterally, and oropharynx. Nl external ear canals without cough reflex - top denture/ Modified Mallampati Score =   3/4    NECK :  without JVD/Nodes/TM/ nl carotid upstrokes bilaterally   LUNGS: no acc muscle use,  Nl contour chest with distant bs  bilaterally without wheeze/ cough on insp or exp maneuvers   CV:  RRR  no s3 or murmur or increase in P2, and no edema   ABD:  Quite obese soft and nontender with limited inspiratory excursion in the supine position. No bruits or organomegaly appreciated, bowel sounds nl  MS:  Nl gait/ ext warm without deformities, calf tenderness, cyanosis or clubbing No obvious joint restrictions   SKIN: warm and dry without lesions    NEURO:  alert, approp, nl sensorium with  no motor or cerebellar deficits apparent.               I personally reviewed  images again  10/20/2017  and agree with radiology impression as follows:   Chest CT  09/08/17  1. No findings to suggest interstitial lung disease. 2. Mild air trapping indicative of mild small airways disease. 3. Areas of mild linear scarring and/or subsegmental atelectasis in the lung bases bilaterally. 4. Aortic atherosclerosis, in addition to 2 vessel coronary artery disease. Please note that although the presence of coronary artery calcium documents the presence of coronary artery disease, the severity of this disease and any potential stenosis cannot be assessed on this non-gated CT examination. Assessment for potential risk factor modification, dietary therapy or pharmacologic therapy may be warranted, if clinically indicated. 5. Severe hepatic steatosis.            Assessment & Plan:

## 2017-10-20 NOTE — Progress Notes (Signed)
Patient completed full PFT today. 

## 2017-10-20 NOTE — Patient Instructions (Signed)
No change in medications   Work on perfecting inhaler technique:  relax and gently blow all the way out then take a nice smooth deep breath back in, triggering the inhaler at same time you start breathing in.  Hold for up to 5 seconds if you can. Blow out thru nose. Rinse and gargle with water when done         See calendar for specific medication instructions and bring it back for each and every office visit for every healthcare provider you see.  Without it,  you may not receive the best quality medical care that we feel you deserve.  You will note that the calendar groups together  your maintenance  medications that are timed at particular times of the day.  Think of this as your checklist for what your doctor has instructed you to do until your next evaluation to see what benefit  there is  to staying on a consistent group of medications intended to keep you well.  The other group at the bottom is entirely up to you to use as you see fit  for specific symptoms that may arise between visits that require you to treat them on an as needed basis.  Think of this as your action plan or "what if" list.   Separating the top medications from the bottom group is fundamental to providing you adequate care going forward.     Please schedule a follow up visit in 3 months but call sooner if needed

## 2017-10-22 ENCOUNTER — Encounter: Payer: Self-pay | Admitting: Internal Medicine

## 2017-10-22 NOTE — Assessment & Plan Note (Signed)
Body mass index is 52.15 kg/m.  -  trending down slightly, encouraged  Lab Results  Component Value Date   TSH 2.27 07/24/2016     Contributing to gerd risk/ doe/reviewed the need and the process to achieve and maintain neg calorie balance > defer f/u primary care including intermittently monitoring thyroid status

## 2017-10-22 NOTE — Assessment & Plan Note (Addendum)
PFT's  10/20/2017  FEV1 1.16 (50 % ) ratio 58  p 14 % improvement from saba p dulera/spriva prior to study with DLCO  78 % corrects to 94 % for alv volume  erv  15%   Despite laba/lama/ics has progressed now down nearly to a GOLD III due to active smoking (see separate a/p)   - The proper method of use, as well as anticipated side effects, of a metered-dose inhaler are discussed and demonstrated to the patient.    I had an extended discussion with the patient reviewing all relevant studies completed to date and  lasting 15 to 20 minutes of a 25 minute visit    See device teaching which extended face to face time for this visit.  Each maintenance medication was reviewed in detail including emphasizing most importantly the difference between maintenance and prns and under what circumstances the prns are to be triggered using an action plan format that is not reflected in the computer generated alphabetically organized AVS which I have not found useful in most complex patients, especially with respiratory illnesses  Please see AVS for specific instructions unique to this visit that I personally wrote and verbalized to the the pt in detail and then reviewed with pt  by my nurse highlighting any  changes in therapy recommended at today's visit to their plan of care.

## 2017-10-22 NOTE — Assessment & Plan Note (Signed)
4-5 min discussion re active cigarette smoking in addition to office E&M  Ask about tobacco use:  ongoing Advise quitting   I took an extended  opportunity with this patient to outline the consequences of continued cigarette use  in airway disorders based on all the data we have from the multiple national lung health studies (perfomed over decades at millions of dollars in cost)  indicating that smoking cessation, not choice of inhalers or physicians, is the most important aspect of her care.   Assess willingness not yet  Assist in quit attempt when ready Arrange follow up. Per pcp

## 2017-10-22 NOTE — Assessment & Plan Note (Signed)
07/30/2015   Walked RA  2 laps @ 185 ft each stopped due to  Sob with sats still 90% so rec 02 hs only plugged into cpap machine - as of 09/16/2017  02 = 2lpm at rest and 3lpm with activity as may be developing cor pulmonale  Adequate control on present rx, reviewed in detail with pt > no change in rx needed

## 2017-10-26 ENCOUNTER — Ambulatory Visit
Admission: RE | Admit: 2017-10-26 | Discharge: 2017-10-26 | Disposition: A | Discharge status: Home / Self Care | Payer: BLUE CROSS/BLUE SHIELD | Source: Ambulatory Visit | Attending: Neurosurgery | Admitting: Neurosurgery

## 2017-10-26 DIAGNOSIS — M4316 Spondylolisthesis, lumbar region: Secondary | ICD-10-CM

## 2017-10-28 ENCOUNTER — Encounter (INDEPENDENT_AMBULATORY_CARE_PROVIDER_SITE_OTHER): Payer: Self-pay | Admitting: Orthopedic Surgery

## 2017-10-28 ENCOUNTER — Ambulatory Visit (INDEPENDENT_AMBULATORY_CARE_PROVIDER_SITE_OTHER): Payer: BLUE CROSS/BLUE SHIELD | Admitting: Orthopedic Surgery

## 2017-10-28 DIAGNOSIS — M1712 Unilateral primary osteoarthritis, left knee: Secondary | ICD-10-CM | POA: Diagnosis not present

## 2017-10-28 MED ORDER — METHYLPREDNISOLONE ACETATE 40 MG/ML IJ SUSP
40.0000 mg | INTRAMUSCULAR | Status: AC | PRN
  Administered 2017-10-28: 40 mg via INTRA_ARTICULAR

## 2017-10-28 MED ORDER — LIDOCAINE HCL 1 % IJ SOLN
5.0000 mL | INTRAMUSCULAR | Status: AC | PRN
  Administered 2017-10-28: 5 mL

## 2017-10-28 NOTE — Progress Notes (Signed)
Office Visit Note   Patient: Brenda [name]           Date of Birth: 1961-01-31           MRN: [id number] Visit Date: 10/28/2017              Requested by: [name], MD [address] [place], [place] [address] PCP: [name], MD  Chief Complaint  Patient presents with  . Left Knee - Pain      HPI: Patient is a 57 year old woman who presents with arthritis of her left knee.  She states she slipped in the bathroom and fell in April and then slipped on the floor again in May sustaining falls both times.  She did go to [facility] radiographs were obtained.  These are reviewed today which shows no evidence of fractures but she does have some arthritic changes.  Patient complains of pain and tenderness over the medial and lateral joint line.  She states that over-the-counter medications do not help and that Vicodin did not help either.  Assessment & Plan: Visit Diagnoses:  1. Unilateral primary osteoarthritis, left knee     Plan: Recommended strengthening for the left knee discussed that she can use compression as well.  Recommended using her cane.  Follow-up if the symptoms worsen or recur.  Follow-Up Instructions: Return if symptoms worsen or fail to improve.   Ortho Exam  Patient is alert, oriented, no adenopathy, well-dressed, normal affect, normal respiratory effort. Examination patient has an antalgic gait she is using a cane and has nasal cannula FiO2.  Semination the left knee there is no effusion she is tender to palpation over the medial and lateral joint line collaterals and cruciates are stable there is no redness or cellulitis.  Her radiographs are reviewed which shows some arthritic changes but minimal joint space narrowing.  Imaging: No results found. No images are attached to the encounter.  Labs: Lab Results  Component Value Date   HGBA1C 7.1 (H) 07/06/2017   HGBA1C 6.5 02/24/2017   HGBA1C 6.2 (H) 07/24/2016   REPTSTATUS  06/05/2013 FINAL 05/30/2013   CULT  05/30/2013    NO GROWTH 5 DAYS Performed at [facility]   LABORGA  01/12/2017    Three or more organisms present,each greater than 10,000 CFU/mL.These organisms,commonly found on external and internal genitalia,are considered to be colonizers.No further testing performed.      Lab Results  Component Value Date   ALBUMIN 4.2 02/24/2017   ALBUMIN 3.9 10/18/2015   ALBUMIN 3.1 (L) 07/20/2015    There is no height or weight on file to calculate BMI.  Orders:  No orders of the defined types were placed in this encounter.  No orders of the defined types were placed in this encounter.    Procedures: Large Joint Inj: L knee on 10/28/2017 1:12 PM Indications: pain and diagnostic evaluation Details: 22 G 1.5 in needle, anteromedial approach  Arthrogram: No  Medications: 5 mL lidocaine 1 %; 40 mg methylPREDNISolone acetate 40 MG/ML Outcome: tolerated well, no immediate complications Procedure, treatment alternatives, risks and benefits explained, specific risks discussed. Consent was given by the patient. Immediately prior to procedure a time out was called to verify the correct patient, procedure, equipment, support staff and site/side marked as required. Patient was prepped and draped in the usual sterile fashion.      Clinical Data: No additional findings.  ROS:  All other systems negative, except as noted in the  HPI. Review of Systems  Objective: Vital Signs: LMP 03/08/2013   Specialty Comments:  No specialty comments available.  PMFS History: Patient Active Problem List   Diagnosis Date Noted  . Unilateral primary osteoarthritis, left knee 10/28/2017  . COPD  GOLD II/III still smoking/ 02 dep  10/20/2017  . Cor pulmonale (chronic) (HCC) 09/16/2017  . Diastolic dysfunction 08/26/2017  . Edema 08/04/2017  . Grief 02/24/2017  . Chronic respiratory failure with hypoxia (HCC) 07/31/2015  . Type 2 diabetes, controlled,  with peripheral neuropathy (HCC)   . OSA on CPAP 09/14/2014  . Health maintenance examination 09/14/2014  . Upper airway cough syndrome 09/22/2013  . Submandibular lymphadenopathy 08/21/2013  . Chronic pain syndrome 05/30/2013  . Migraine, unspecified, without mention of intractable migraine without mention of status migrainosus 05/30/2013  . Acute sinus infection 06/20/2012  . Pyelonephritis 05/05/2012  . Asthmatic bronchitis 02/27/2008  . Leucocytosis 02/22/2008  . NONSPECIFIC ABNORMAL ELECTROCARDIOGRAM 02/22/2008  . HYPERTENSION, BENIGN ESSENTIAL 06/23/2007  . HYPERTRIGLYCERIDEMIA 06/01/2007  . Obesity, morbid, BMI 50 or higher (HCC) 06/01/2007  . Adjustment disorder with anxiety 06/01/2007  . Cigarette smoker 06/01/2007   Past Medical History:  Diagnosis Date  . Acute pancreatitis   . Asthma    main dyspnea thought due to deconditioning (10/2013)  . Bipolar I disorder, most recent episode (or current) manic, unspecified    pt denies this  . Bronchitis, chronic obstructive (HCC) 2008 & 2009   FeV1 64% TLC 105% DLCO 55% 2008  -  FeV1 81% FeF 25-75 43% 2009  . COPD (chronic obstructive pulmonary disease) (HCC)    emphysema, not an issue per pulm (10/2013)  . History of pyelonephritis 05/2012  . HTN (hypertension)   . Hx of migraines   . Knee pain, right   . Leukocytosis, unspecified   . Lumbar disc disease with radiculopathy    multilevel spondylitic changes, scoliosis, and anterolisthesis L4 not thought to currently be surg candidate (Dr. Phoebe Perch, [facility]) (Dr. Ollen Bowl, [place])  . Nicotine addiction   . Nonspecific abnormal electrocardiogram (ECG) (EKG)   . Obesity   . Pure hyperglyceridemia   . Suicide attempt (HCC) 06/2001  . Swelling of limb   . Urge and stress incontinence 07/2012   ([facility])    Family History  Problem Relation Age of Onset  . Heart disease Brother        MI in early 82's  . Hypertension Mother   . Hyperlipidemia Mother   . Hypertension Father     . Asthma Father   . Breast cancer Paternal Grandmother     Past Surgical History:  Procedure Laterality Date  . CHOLECYSTECTOMY  07/1982  . CT MAXILLOFACIAL WO/W CM  06/14/06   Left max mucocele  . CT of chest  06/14/2006   Normal except c/w active inflammation / infection.  Left renal atrophy  . ERCP / sphincterotomy - stenosis  01/15/06  . lumbar MRI  11/2010   multilevel spondylitic changes with upper lumbar scoliosis and anterolisthesis of L4 on 5 with some L foraminal narrowing esp at L/3 with concavity of scoliosis, some central stenosis and biforaminal narrowing at L4/5 with spondylolisthesis  . [place] - Pneumonia, acute asthma, left maxillary sinusitis  01/11 - 06/18/2006  . Retained stone     ERCP secondary to retained stone  . Right knee surgery  1984,1989,1989,1991,1994   Reconstructions for ACL insuff  . TUBAL LIGATION     Social History   Occupational History  . Occupation: Nursing  Secretary    Employer: [employer]     Employer: [employer]   Tobacco Use  . Smoking status: Current Some Day Smoker    Years: 41.00    Types: Cigarettes  . Smokeless tobacco: Never Used  . Tobacco comment: 4 cig a day  Substance and Sexual Activity  . Alcohol use: No    Alcohol/week: 0.0 oz  . Drug use: No  . Sexual activity: Not on file

## 2017-11-02 ENCOUNTER — Other Ambulatory Visit: Payer: Self-pay | Admitting: Family Medicine

## 2017-11-02 ENCOUNTER — Other Ambulatory Visit: Payer: Self-pay | Admitting: Internal Medicine

## 2017-11-02 NOTE — Telephone Encounter (Signed)
Last filled:  08/20/17, #30 Last OV:  08/25/17 Next OV:  none

## 2017-11-04 NOTE — Telephone Encounter (Signed)
Eprescribed.

## 2017-11-10 ENCOUNTER — Encounter: Payer: Self-pay | Admitting: Internal Medicine

## 2017-11-10 DIAGNOSIS — J9611 Chronic respiratory failure with hypoxia: Secondary | ICD-10-CM

## 2017-11-12 ENCOUNTER — Other Ambulatory Visit: Payer: Self-pay | Admitting: Family Medicine

## 2017-11-18 ENCOUNTER — Encounter: Payer: Self-pay | Admitting: Internal Medicine

## 2017-11-18 NOTE — Telephone Encounter (Signed)
Route (nasal cannula OR mask): [place] Liter Flow: 2-3 Frequency (continuous with stationary and portable oxygen unit needed OR only at night): 24/7 Was this based off an ONO?: No DME: [facility] Oxygen Conserving Device (Yes or No): Yes Type of Oxygen (Gas or Liquid): Gas   Pt needs POC for travel please  She uses 2lpm with rest and 3lpm with exertion  Per patient's chart, an [facility] for a P[facility]. [facility] She stated that they received the order for the POC but for a travel machine. She stated that the retail store normally handles those orders. Per their records, the retail store attempted to call the patient on 6/14 but they could not reach the patient. She will reach out to the retail store to follow up with the patient once more.

## 2017-11-21 ENCOUNTER — Other Ambulatory Visit: Payer: Self-pay | Admitting: Adult Health

## 2017-12-17 ENCOUNTER — Other Ambulatory Visit: Payer: Self-pay | Admitting: Internal Medicine

## 2018-01-18 ENCOUNTER — Other Ambulatory Visit: Payer: Self-pay | Admitting: Family Medicine

## 2018-01-18 NOTE — Telephone Encounter (Signed)
Eprescribed.

## 2018-01-18 NOTE — Telephone Encounter (Signed)
Name of Medication: Lorazepam Name of Pharmacy: [facility] [address] Last Fill or Written Date and Quantity: 11/05/17, #30 Last Office Visit and Type: 08/25/17, acute Next Office Visit and Type: none Last Controlled Substance Agreement Date: none Last UDS:

## 2018-01-19 ENCOUNTER — Encounter: Payer: Self-pay | Admitting: Internal Medicine

## 2018-01-19 ENCOUNTER — Ambulatory Visit: Payer: BLUE CROSS/BLUE SHIELD | Admitting: Internal Medicine

## 2018-01-19 VITALS — BP 144/82 | HR 91 | Ht 61.0 in | Wt 270.0 lb

## 2018-01-19 DIAGNOSIS — J9611 Chronic respiratory failure with hypoxia: Secondary | ICD-10-CM

## 2018-01-19 DIAGNOSIS — F17210 Nicotine dependence, cigarettes, uncomplicated: Secondary | ICD-10-CM

## 2018-01-19 DIAGNOSIS — R058 Other specified cough: Secondary | ICD-10-CM

## 2018-01-19 DIAGNOSIS — J449 Chronic obstructive pulmonary disease, unspecified: Secondary | ICD-10-CM

## 2018-01-19 DIAGNOSIS — R05 Cough: Secondary | ICD-10-CM

## 2018-01-19 MED ORDER — GABAPENTIN 300 MG PO CAPS: ORAL_CAPSULE | ORAL | 11 refills | Status: DC

## 2018-01-19 NOTE — Patient Instructions (Addendum)
For cough add gabapentin 300 mg at lung and supper   See calendar for specific medication instructions and bring it back for each and every office visit for every healthcare provider you see.  Without it,  you may not receive the best quality medical care that we feel you deserve.  You will note that the calendar groups together  your maintenance  medications that are timed at particular times of the day.  Think of this as your checklist for what your doctor has instructed you to do until your next evaluation to see what benefit  there is  to staying on a consistent group of medications intended to keep you well.  The other group at the bottom is entirely up to you to use as you see fit  for specific symptoms that may arise between visits that require you to treat them on an as needed basis.  Think of this as your action plan or "what if" list.   Separating the top medications from the bottom group is fundamental to providing you adequate care going forward.    See Tammy NP 3 months with all your medications, even over the counter meds, separated in two separate bags, the ones you take no matter what vs the ones you stop once you feel better and take only as needed when you feel you need them.   Tammy  will generate for you a new user friendly medication calendar that will put us all on the same page re: your medication use.

## 2018-01-19 NOTE — Progress Notes (Signed)
Subjective:   Patient ID: Brenda [name], female    DOB: Jul 06, 1960    MRN: [id number]   Brief patient profile:  47  yowf  Active smoker wt  07/2015 = 250 and admitted with" aecopd" but with pfts nearly nl 2/29/15 p rx so was characterized as GOLD 0/ AB  But by 09/2017 met criteria for GOLD II    History of Present Illness  10/20/2017  f/u ov/Judyann Casasola re:  A B/  still smoking  And now meets criteria for GOLD II maint on dulera/spiriva  Chief Complaint  Patient presents with  . Follow-up    Pt had full PFT prior to OV today. Pt has SOB with exertion, productive cough-yellow, wheezing, some chest tightness. Pt is on 2 liters of O2 con't.  Dyspnea:  Food lion struggle on 3lpm  Cough: better Sleep: cpap/ 30 degrees SABA use:  Needing less  rec No change work on    01/19/2018  f/u ov/Reagen Goates re: GOLD II and still smoking/ maint on symb/spiriva/ 02 2-3lpm and using med calendar well  Chief Complaint  Patient presents with  . Follow-up    pt c/o worsening sob worse with warm weather.     Dyspnea:  MMRC3 = can't walk 100 yards even at a slow pace at a flat grade s stopping due to sob even on 3lpm  Cough: some worse since hot weather . 4-5 days  Sleeping: 30 degrees/cpap 3lpm SABA BWL:SLHT daily though hfa suboptimal/ short ti   No obvious day to day or daytime variability or assoc excess/ purulent sputum or mucus plugs or hemoptysis or cp or chest tightness, subjective wheeze or overt sinus or hb symptoms.   Sleeping as above ok  without nocturnal  or early am exacerbation  of respiratory  c/o's or need for noct saba. Also denies any obvious fluctuation of symptoms with weather or environmental changes or other aggravating or alleviating factors except as outlined above   No unusual exposure hx or h/o childhood pna/ asthma or knowledge of premature birth.  Current Allergies, Complete Past Medical History, Past Surgical History, Family History, and Social History were reviewed in [facility] record.  ROS  The following are not active complaints unless bolded Hoarseness, sore throat, dysphagia, dental problems, itching, sneezing,  nasal congestion or discharge of excess mucus or purulent secretions, ear ache,   fever, chills, sweats, unintended wt loss or wt gain, classically pleuritic or exertional cp,  orthopnea pnd or arm/hand swelling  or leg swelling, presyncope, palpitations, abdominal pain, anorexia, nausea, vomiting, diarrhea  or change in bowel habits or change in bladder habits, change in stools or change in urine, dysuria, hematuria,  rash, arthralgias, visual complaints, headache, numbness, weakness or ataxia or problems with walking or coordination,  change in mood or  memory.        Current Meds  Medication Sig  . albuterol (PROAIR HFA) 108 (90 Base) MCG/ACT inhaler Inhale 2 puffs into the lungs every 6 (six) hours as needed for wheezing or shortness of breath.  Marland Kitchen albuterol (PROVENTIL) (2.5 MG/3ML) 0.083% nebulizer solution INHALE 1 VIAL VIA NEBULIZER EVERY 6 HOURS AS NEEDED FOR WHEEZING/SHORT BREATH  . benzonatate (TESSALON) 200 MG capsule Take 1 capsule (200 mg total) by mouth 3 (three) times daily as needed for cough. Swallow whole, to not bite pill  . cetirizine (ZYRTEC) 10 MG tablet Take 10 mg by mouth daily.   . citalopram (CELEXA) 20 MG tablet TAKE 1 TABLET  BY MOUTH EVERY DAY  . famotidine (PEPCID) 20 MG tablet One at bedtime  . fluticasone (FLONASE) 50 MCG/ACT nasal spray USE 2 SPRAYS INTO EACH NOSTRIL EVERY DAY  . furosemide (LASIX) 20 MG tablet TAKE 2 TABLETS BY MOUTH EVERY DAY  . gabapentin (NEURONTIN) 300 MG capsule Take 1 capsule (300 mg total) by mouth 2 (two) times daily.  Marland Kitchen guaiFENesin (MUCINEX) 600 MG 12 hr tablet Take 1 tablet (600 mg total) by mouth 2 (two) times daily.  Marland Kitchen HYDROcodone-acetaminophen (NORCO/VICODIN) 5-325 MG tablet Take 1 tablet by mouth every 4 (four) hours as needed.  Marland Kitchen ibuprofen (ADVIL) 200 MG tablet Take 400 mg by  mouth every 6 (six) hours as needed for headache.  Marland Kitchen LORazepam (ATIVAN) 0.5 MG tablet TAKE 1/2 TO 1 TABLET BY MOUTH TWICE A DAY AS NEEDED FOR ANXIETY  . metFORMIN (GLUCOPHAGE) 500 MG tablet TAKE 1 TABLET BY MOUTH EVERYDAY AT BEDTIME  . mometasone-formoterol (DULERA) 200-5 MCG/ACT AERO Inhale 2 puffs into the lungs 2 (two) times daily.  . Multiple Vitamin (MULTIVITAMIN WITH MINERALS) TABS tablet Take 1 tablet by mouth daily.  Marland Kitchen nystatin (MYCOSTATIN) 100000 UNIT/ML suspension TAKE 1 TEASPOONFUL BY MOUTH 3 TIMES A DAY FOR 10 DAYS (Patient taking differently: TAKE 1 TEASPOONFUL BY MOUTH 3 TIMES DAILY AS NEEDED FOR MOUTH SORES.)  . OXYGEN 2 lpm with sleep with CPAP and during the day with exertion  . pantoprazole (PROTONIX) 40 MG tablet TAKE 1 TABLET BY MOUTH EVERY DAY 30 TO 60 MINUTES BEFORE THE FIRST MEAL OF THE DAY  . promethazine-dextromethorphan (PROMETHAZINE-DM) 6.25-15 MG/5ML syrup Take 5 mLs by mouth 4 (four) times daily as needed for cough.  Marland Kitchen spironolactone (ALDACTONE) 25 MG tablet TAKE 1 TABLET BY MOUTH TWICE A DAY  . Tiotropium Bromide Monohydrate (SPIRIVA RESPIMAT) 2.5 MCG/ACT AERS Inhale 2 puffs into the lungs daily.                  Objective:   Physical Exam  amb wf with shrill dry cough again   10/06/2013         288 > 11/17/2013  288> 07/30/2015  266  > 08/12/2017   284 > 09/16/2017  278 > 01/19/2018   270     09/22/13 293 lb 12.8 oz (133.267 kg)  09/18/13 289 lb 4 oz (131.203 kg)  08/21/13 283 lb 8 oz (128.595 kg)     Vital signs reviewed - Note on arrival 02 sats  98% on 2lpm       HEENT: nl  turbinates bilaterally, and oropharynx. Nl external ear canals without cough reflex - top denture.  Modified Mallampati Score =   2    NECK :  without JVD/Nodes/TM/ nl carotid upstrokes bilaterally   LUNGS: no acc muscle use,  Nl contour chest with min late exp rhonchi bilaterally without cough on insp or exp maneuvers   CV:  RRR  no s3 or murmur or increase in P2, and no edema    ABD:  Quite obese   With limited  inspiratory excursion in the supine position. No bruits or organomegaly appreciated, bowel sounds nl  MS:  Nl gait/ ext warm without deformities, calf tenderness, cyanosis or clubbing No obvious joint restrictions   SKIN: warm and dry without lesions    NEURO:  alert, approp, nl sensorium with  no motor or cerebellar deficits apparent.             I personally reviewed images again  10/20/2017  and  agree with radiology impression as follows:   Chest CT  09/08/17  1. No findings to suggest interstitial lung disease. 2. Mild air trapping indicative of mild small airways disease. 3. Areas of mild linear scarring and/or subsegmental atelectasis in the lung bases bilaterally. 4. Aortic atherosclerosis, in addition to 2 vessel coronary artery disease. Please note that although the presence of coronary artery calcium documents the presence of coronary artery disease, the severity of this disease and any potential stenosis cannot be assessed on this non-gated CT examination. Assessment for potential risk factor modification, dietary therapy or pharmacologic therapy may be warranted, if clinically indicated. 5. Severe hepatic steatosis.            Assessment & Plan:

## 2018-01-20 ENCOUNTER — Encounter: Payer: Self-pay | Admitting: Internal Medicine

## 2018-01-20 NOTE — Assessment & Plan Note (Signed)
07/30/2015   Walked RA  2 laps @ 185 ft each stopped due to  Sob with sats still 90% so rec 02 hs only plugged into cpap machine - as of 09/16/2017  02 = 2lpm at rest and 3lpm with activity as may be developing cor pulmonale - 01/19/2018   Walked 2lpm x  2 laps @ 185 ft each stopped due to  Sob with sats ok at moderate pace  Adequate control on present rx, reviewed in detail with pt > no change in rx needed  = 2-3 lpm 24/7

## 2018-01-20 NOTE — Assessment & Plan Note (Signed)

## 2018-01-20 NOTE — Assessment & Plan Note (Signed)
-   stopped all dpi's 09/22/2013 and cozar - 09/22/2013 added flutter valve - 10/06/13 add gerd rx > resolved 11/17/2013  - gerd rx added 09/16/2017   - advised 01/19/2018 push gabapentin to 300 qid for active cough / continue gerd rx /diet and use the flutter valve   I had an extended discussion with the patient reviewing all relevant studies completed to date and  lasting 15 to 20 minutes of a 25 minute visit    See device teaching which extended face to face time for this visit   Each maintenance medication was reviewed in detail including most importantly the difference between maintenance and prns and under what circumstances the prns are to be triggered using an action plan format that is not reflected in the computer generated alphabetically organized AVS but trather by a customized med calendar that reflects the AVS meds with confirmed 100% correlation.   In addition, Please see AVS for unique instructions that I personally wrote and verbalized to the the pt in detail and then reviewed with pt  by my nurse highlighting any  changes in therapy recommended at today's visit to their plan of care.

## 2018-01-20 NOTE — Assessment & Plan Note (Signed)
Body mass index is 51.02 kg/m.  -  trending down slightly / encouraged  Lab Results  Component Value Date   TSH 2.27 07/24/2016     Contributing to gerd risk/ doe/reviewed the need and the process to achieve and maintain neg calorie balance > defer f/u primary care including intermittently monitoring thyroid status

## 2018-01-20 NOTE — Assessment & Plan Note (Addendum)
Last worked Jun 29 2017 seeking full disability   recPFT's  10/20/2017  FEV1 1.16 (50 % ) ratio 58  p 14 % improvement from saba p dulera/spriva prior to study with DLCO  78 % corrects to 94 % for alv volume  erv  15%   On max rx and still smoking (see separate a/p)  With with shrill upper airway cough which is not typical of copd but rather uacs (see separate a/p) > no change in resp rx feasible at this point   - The proper method of use, as well as anticipated side effects, of a metered-dose inhaler are discussed and demonstrated to the patient.

## 2018-01-29 ENCOUNTER — Other Ambulatory Visit: Payer: Self-pay | Admitting: Internal Medicine

## 2018-02-09 ENCOUNTER — Other Ambulatory Visit: Payer: Self-pay | Admitting: Adult Health

## 2018-02-23 ENCOUNTER — Other Ambulatory Visit: Payer: Self-pay | Admitting: Family Medicine

## 2018-02-23 NOTE — Telephone Encounter (Signed)
Gabapentin Last filled:  11/27/17, #180 Last OV:  08/25/17 Next OV:  none

## 2018-03-09 ENCOUNTER — Other Ambulatory Visit: Payer: Self-pay | Admitting: Family Medicine

## 2018-03-09 NOTE — Telephone Encounter (Signed)
Dr. Reece Agar, I see where you have prescribed the HCTZ for the pt in the past.  But I do not see any notes or messages stating it was discontinued.  Should this be refilled?

## 2018-03-13 ENCOUNTER — Other Ambulatory Visit: Payer: Self-pay | Admitting: Family Medicine

## 2018-03-17 ENCOUNTER — Other Ambulatory Visit: Payer: Self-pay | Admitting: Family Medicine

## 2018-03-18 NOTE — Telephone Encounter (Signed)
Name of Medication: Lorazepam Name of Pharmacy: [facility] or Written Date and Quantity: 01/18/18, #30 Last Office Visit and Type: 08/25/17, acute Next Office Visit and Type: none Last Controlled Substance Agreement Date: none Last UDS: none

## 2018-03-19 ENCOUNTER — Other Ambulatory Visit: Payer: Self-pay | Admitting: Family Medicine

## 2018-03-19 NOTE — Telephone Encounter (Signed)
E prescribed. Due for physical 

## 2018-04-22 ENCOUNTER — Encounter: Payer: Self-pay | Admitting: Adult Health

## 2018-04-22 ENCOUNTER — Ambulatory Visit (INDEPENDENT_AMBULATORY_CARE_PROVIDER_SITE_OTHER): Payer: BLUE CROSS/BLUE SHIELD | Admitting: Adult Health

## 2018-04-22 ENCOUNTER — Other Ambulatory Visit: Payer: Self-pay | Admitting: Family Medicine

## 2018-04-22 DIAGNOSIS — J9611 Chronic respiratory failure with hypoxia: Secondary | ICD-10-CM

## 2018-04-22 DIAGNOSIS — G4733 Obstructive sleep apnea (adult) (pediatric): Secondary | ICD-10-CM | POA: Diagnosis not present

## 2018-04-22 DIAGNOSIS — I5189 Other ill-defined heart diseases: Secondary | ICD-10-CM

## 2018-04-22 DIAGNOSIS — W19XXXA Unspecified fall, initial encounter: Secondary | ICD-10-CM

## 2018-04-22 DIAGNOSIS — Z9989 Dependence on other enabling machines and devices: Secondary | ICD-10-CM

## 2018-04-22 DIAGNOSIS — J449 Chronic obstructive pulmonary disease, unspecified: Secondary | ICD-10-CM

## 2018-04-22 DIAGNOSIS — F17210 Nicotine dependence, cigarettes, uncomplicated: Secondary | ICD-10-CM

## 2018-04-22 MED ORDER — DOXYCYCLINE HYCLATE 100 MG PO TABS: 100.0000 mg | ORAL_TABLET | Freq: Two times a day (BID) | ORAL | 0 refills | Status: DC

## 2018-04-22 MED ORDER — NYSTATIN 100000 UNIT/ML MT SUSP: OROMUCOSAL | 1 refills | Status: DC

## 2018-04-22 MED ORDER — PROMETHAZINE-DM 6.25-15 MG/5ML PO SYRP: 5.0000 mL | ORAL_SOLUTION | Freq: Four times a day (QID) | ORAL | 0 refills | Status: DC | PRN

## 2018-04-22 MED ORDER — BENZONATATE 200 MG PO CAPS: 200.0000 mg | ORAL_CAPSULE | Freq: Three times a day (TID) | ORAL | 2 refills | Status: DC | PRN

## 2018-04-22 MED ORDER — PREDNISONE 10 MG PO TABS: ORAL_TABLET | ORAL | 0 refills | Status: DC

## 2018-04-22 NOTE — Assessment & Plan Note (Signed)
Appears compensated without evidence of fluid overload on exam continue on current regimen

## 2018-04-22 NOTE — Patient Instructions (Addendum)
Doxycycline 100mg  Twice daily  For 1 week , take with food.  Prednisone taper over next week. Call if blood sugar >250 .  Mucinex DM Twice daily  As needed  Cough /congestion  Use oxygen 2l/m  Wear CPAP  At bedtime and with naps. Try to get in at least 4hr .  Work on not smoking . Set a quit date.  Continue on [place]pir[place] and Dulera. Rinse after use.  Use Albuterol Neb every 4hrs as needed.  Follow up with PCP or Ortho for left leg injury .  Follow up with with Dr. Sherene SiresWert  In 2-3  Months  and As needed   Please contact office for sooner follow up if symptoms do not improve or worsen or seek emergency care  Get flu shot next week if better.

## 2018-04-22 NOTE — Assessment & Plan Note (Signed)
Mechanical fall at home.  Patient had no loss of consciousness or known head injury.  Exam is unrevealing.  Advised patient if symptoms worsen or do not improve she will need further evaluation with primary care physician or orthopedist.  Offered a referral to orthopedist that she sees Dr. due to but were declined today.  Advised to use supportive care ice heat rest.  Please contact office for sooner follow up if symptoms do not improve or worsen or seek emergency care

## 2018-04-22 NOTE — Assessment & Plan Note (Signed)
Encouraged on weight loss 

## 2018-04-22 NOTE — Assessment & Plan Note (Signed)
Encouraged on CPAP compliance.  Plan  Patient Instructions  Doxycycline 100mg  Twice daily  For 1 week , take with food.  Prednisone taper over next week. Call if blood sugar >250 .  Mucinex DM Twice daily  As needed  Cough /congestion  Use oxygen 2l/m  Wear CPAP  At bedtime and with naps. Try to get in at least 4hr .  Work on not smoking . Set a quit date.  Continue on [place]pir[place] and Dulera. Rinse after use.  Use Albuterol Neb every 4hrs as needed.  Follow up with PCP or Ortho for left leg injury .  Follow up with with Dr. Sherene SiresWert  In 2-3  Months  and As needed   Please contact office for sooner follow up if symptoms do not improve or worsen or seek emergency care  Get flu shot next week if better.

## 2018-04-22 NOTE — Assessment & Plan Note (Signed)
Continue on oxygen 2 L 

## 2018-04-22 NOTE — Assessment & Plan Note (Signed)
Encouraged on smoking cessation 

## 2018-04-22 NOTE — Assessment & Plan Note (Addendum)
Exacerbation.  Patient is encouraged on smoking cessation.  Will treat with antibiotics and steroids.  She advised if not improving will need further evaluation.  Patient's medications were reviewed today and patient education was given. Computerized medication calendar was adjusted/completed    Plan  Patient Instructions  Doxycycline 100mg  Twice daily  For 1 week , take with food.  Prednisone taper over next week. Call if blood sugar >250 .  Mucinex DM Twice daily  As needed  Cough /congestion  Use oxygen 2l/m  Wear CPAP  At bedtime and with naps. Try to get in at least 4hr .  Work on not smoking . Set a quit date.  Continue on [place]pir[place] and Dulera. Rinse after use.  Use Albuterol Neb every 4hrs as needed.  Follow up with PCP or Ortho for left leg injury .  Follow up with with Dr. Sherene SiresWert  In 2-3  Months  and As needed   Please contact office for sooner follow up if symptoms do not improve or worsen or seek emergency care  Get flu shot next week if better.

## 2018-04-22 NOTE — Progress Notes (Signed)
 @Patient  ID: Brenda BarrenJudy L [name], female    DOB: Nov 24, 1960, 57 y.o.   MRN: [id number]  Chief Complaint  Patient presents with  . Follow-up    COPD     Referring provider: Eustaquio BoydenGutierrez, Javier, MD  HPI: [id number] year old female former smoker, morbidly obese, followed for COPD and obstructive sleep apnea,  chronic hypoxic respiratory failure on 2 L oxygen Medical history significant for diabetes, chronic pain  TEST/EVENTS :  PFT 07/29/06 >> FEV1 1.75 (73%), FEV1% 60, TLC 4.81 (105%), DLCO 55%, + BD  HST 12/08/14 >> AHI 58.6, SaO2 low 64%.  A1AT 03/25/15 >> 176, MM  Auto CPAP 10/02/15 to 10/31/15 >>used on 30 of 30 nights with average 7 hrs 19 min. Average AHI 1 with median CPAP 9 and 95 th percentile CPAP 13 cm H2O  PFT 11/04/14 >> FEV1 1.36 (57%), FEV1% 63, TLC 4.12 (89%), DLCO 93%, +BD  PFT may 2019 showed 8150%, ratio 58, FVC 67%, positive bronchodilator response, DLCO 78%.  February 2017 2D echo showed EF 60 to 65%  04/22/2018 Follow up : COPD , OSA ,O2 RF , Med Review  Patient presents for a 5563-month follow-up.   Patient says over the last 1 to 2 weeks that her breathing has not been as good.  She has had a productive cough with thick yellow mucus, intermittent wheezing.  And increased shortness of breath.  She is recently had a lot of housework done after a water leak in her house.  She says she does not know of any mold or seen any evidence of mold.  However has had a lot of dust in her house.  As the floors had to be replaced.  She remains on Dulera and Spiriva.  Says she is very compliant. Requests refills of her cough medicines including Tessalon and Phenergan DM.  Says cough keeps her up at night Appetite is good with no nausea vomiting or diarrhea.  No fevers.  She remains on oxygen 2 L. She has diastolic dysfunction with lower extremity swelling.  Says that it is been doing well on her current regimen of diuretics.  Patient is on CPAP with oxygen at bedtime.  Says that she has not  been wearing it as consistently.  Download shows 70% usage.  Patient is on auto CPAP 5-15 solid H2O.  AHI 2.5.  Minimum leaks.  Daily usage at 5 hours.. We discussed importance of compliance. Patient education on potential complications of untreated sleep apnea. Also went over the that patient is on pain medications and sedating effect can be harmful to patient's especially with untreated sleep apnea  Patient says this morning that she was walking down her stairs outside and slipped and fell due to wet leaves.  She had no loss of consciousness.  No known head injury.  Says that she did land on her right side says she was able to bear weight.  No knee pain.  Does have some pain along her left lateral lower leg.  But is able to bear weight.  No increased swelling or bruising noted as of yet.  Not on chronic anticoagulation  Allergies  Allergen Reactions  . Metolazone Other (See Comments)    Metabolic mood swings    Immunization History  Administered Date(s) Administered  . Influenza Whole 04/01/2002  . Influenza,inj,Quad PF,6+ Mos 03/21/2015  . Influenza-Unspecified 03/01/2014, 04/22/2016, 03/25/2017  . Pneumococcal Polysaccharide-23 04/01/2002, 07/24/2015  . Td 06/01/1993  . Tdap 02/24/2017    Past Medical History:  Diagnosis Date  .  Acute pancreatitis   . Asthma    main dyspnea thought due to deconditioning (10/2013)  . Bipolar I disorder, most recent episode (or current) manic, unspecified    pt denies this  . Bronchitis, chronic obstructive (HCC) 2008 & 2009   FeV1 64% TLC 105% DLCO 55% 2008  -  FeV1 81% FeF 25-75 43% 2009  . COPD (chronic obstructive pulmonary disease) (HCC)    emphysema, not an issue per pulm (10/2013)  . History of pyelonephritis 05/2012  . HTN (hypertension)   . Hx of migraines   . Knee pain, right   . Leukocytosis, unspecified   . Lumbar disc disease with radiculopathy    multilevel spondylitic changes, scoliosis, and anterolisthesis L4 not thought to  currently be surg candidate (Dr. Phoebe Perch, [facility]) (Dr. Ollen Bowl, [facility])  . Nicotine addiction   . Nonspecific abnormal electrocardiogram (ECG) (EKG)   . Obesity   . Pure hyperglyceridemia   . Suicide attempt (HCC) 06/2001  . Swelling of limb   . Urge and stress incontinence 07/2012   ([facility])    Tobacco History: Social History   Tobacco Use  Smoking Status Current Some Day Smoker  . Years: 41.00  . Types: Cigarettes  Smokeless Tobacco Never Used  Tobacco Comment   4 cig a day   Ready to quit: No Counseling given: Yes Comment: 4 cig a day   Outpatient Medications Prior to Visit  Medication Sig Dispense Refill  . albuterol (PROVENTIL) (2.5 MG/3ML) 0.083% nebulizer solution INHALE 1 VIAL VIA NEBULIZER EVERY 6 HOURS AS NEEDED FOR WHEEZING/SHORT BREATH 150 mL 1  . benzonatate (TESSALON) 200 MG capsule Take 1 capsule (200 mg total) by mouth 3 (three) times daily as needed for cough. Swallow whole, to not bite pill 30 capsule 1  . cetirizine (ZYRTEC) 10 MG tablet Take 10 mg by mouth daily.     . citalopram (CELEXA) 20 MG tablet TAKE 1 TABLET BY MOUTH EVERY DAY 90 tablet 0  . famotidine (PEPCID) 20 MG tablet One at bedtime 30 tablet 11  . fluticasone (FLONASE) 50 MCG/ACT nasal spray SPRAY 2 SPRAYS INTO EACH NOSTRIL EVERY DAY 48 g 0  . furosemide (LASIX) 20 MG tablet TAKE 2 TABLETS BY MOUTH EVERY DAY 180 tablet 1  . gabapentin (NEURONTIN) 300 MG capsule TAKE 1 CAPSULE BY MOUTH TWICE A DAY 180 capsule 3  . gabapentin (NEURONTIN) 300 MG capsule Take 300 mg by mouth 3 (three) times daily.    Marland Kitchen guaiFENesin (MUCINEX) 600 MG 12 hr tablet Take 1 tablet (600 mg total) by mouth 2 (two) times daily. 30 tablet 0  . hydrochlorothiazide (HYDRODIURIL) 25 MG tablet TAKE 1 TABLET BY MOUTH EVERY DAY 90 tablet 1  . HYDROcodone-acetaminophen (NORCO/VICODIN) 5-325 MG tablet Take 1 tablet by mouth every 4 (four) hours as needed. 16 tablet 0  . ibuprofen (ADVIL) 200 MG tablet Take 400 mg by mouth every 6  (six) hours as needed for headache.    Marland Kitchen LORazepam (ATIVAN) 0.5 MG tablet TAKE 1/2 TO 1 TABLET BY MOUTH TWICE A DAY AS NEEDED FOR ANXIETY 30 tablet 0  . metFORMIN (GLUCOPHAGE) 500 MG tablet TAKE 1 TABLET BY MOUTH EVERYDAY AT BEDTIME 90 tablet 1  . mometasone-formoterol (DULERA) 200-5 MCG/ACT AERO Inhale 2 puffs into the lungs 2 (two) times daily. 1 Inhaler 11  . Multiple Vitamin (MULTIVITAMIN WITH MINERALS) TABS tablet Take 1 tablet by mouth daily.    Marland Kitchen nystatin (MYCOSTATIN) 100000 UNIT/ML suspension TAKE 1 TEASPOONFUL BY MOUTH 3  TIMES A DAY FOR 10 DAYS (Patient taking differently: TAKE 1 TEASPOONFUL BY MOUTH 3 TIMES DAILY AS NEEDED FOR MOUTH SORES.) 150 mL 0  . OXYGEN 2 lpm with sleep with CPAP and during the day with exertion    . pantoprazole (PROTONIX) 40 MG tablet TAKE 1 TABLET BY MOUTH EVERY DAY 30 TO 60 MINUTES BEFORE THE FIRST MEAL OF THE DAY 90 tablet 1  . PROAIR HFA 108 (90 Base) MCG/ACT inhaler INHALE 2 PUFFS BY MOUTH INTO THE LUNGS EVERY 6 HOURS AS NEEDED FOR WHEEZE OR SHORTNESS OF BREATH 8.5 Inhaler 5  . promethazine-dextromethorphan (PROMETHAZINE-DM) 6.25-15 MG/5ML syrup Take 5 mLs by mouth 4 (four) times daily as needed for cough. 150 mL 0  . spironolactone (ALDACTONE) 25 MG tablet TAKE 1 TABLET BY MOUTH TWICE A DAY 180 tablet 0  . Tiotropium Bromide Monohydrate (SPIRIVA RESPIMAT) 2.5 MCG/ACT AERS Inhale 2 puffs into the lungs daily. 1 Inhaler 11  . gabapentin (NEURONTIN) 300 MG capsule Take at lunch and supper when coughing 60 capsule 11   No facility-administered medications prior to visit.      Review of Systems  Constitutional:   No  weight loss, night sweats,  Fevers, chills,  +fatigue, or  lassitude.  HEENT:   No headaches,  Difficulty swallowing,  Tooth/dental problems, or  Sore throat,                No sneezing, itching, ear ache,  +nasal congestion, post nasal drip,   CV:  No chest pain,  Orthopnea, PND, swelling in lower extremities, anasarca, dizziness,  palpitations, syncope.   GI  No heartburn, indigestion, abdominal pain, nausea, vomiting, diarrhea, change in bowel habits, loss of appetite, bloody stools.   Resp: .  No chest wall deformity  Skin: no rash or lesions.  GU: no dysuria, change in color of urine, no urgency or frequency.  No flank pain, no hematuria   MS: Positive joint pain    Physical Exam  BP 130/70 (BP Location: Left Arm, Cuff Size: Large)   Pulse 82   LMP 03/08/2013   SpO2 96%   GEN: A/Ox3; pleasant , NAD, obese, on oxygen, walks with a cane   HEENT:  [place]/AT,  EACs-clear, TMs-wnl, NOSE-clear, THROAT-clear, no lesions, no postnasal drip or exudate noted.  Class III MP airway  NECK:  Supple w/ fair ROM; no JVD; normal carotid impulses w/o bruits; no thyromegaly or nodules palpated; no lymphadenopathy.    RESP  Clear  P & A; w/o, wheezes/ rales/ or rhonchi. no accessory muscle use, no dullness to percussion  CARD:  RRR, no m/r/g, trace peripheral edema, pulses intact, no cyanosis or clubbing.  GI:   Soft & nt; nml bowel sounds; no organomegaly or masses detected.   Musco: Warm bil, no deformities or joint swelling noted.  Bilateral knee range of motion appears normal.  Bilateral elbow and shoulder range of motion appears normal.  Normal grips.  Walks with a cane.  Tenderness along left lateral mid lower extremity.  No obvious bruising or deformity noted.  Pulses intact.  Skin intact.  Neuro: alert, no focal deficits noted.    Skin: Warm, very small abrasion along right lateral proximal forearm,     Lab Results:  CBC  BMET  BNP  ProBNP  Imaging: No results found.    PFT Results Latest Ref Rng & Units 10/20/2017 11/04/2015  FVC-Pre L 1.64 1.91  FVC-Predicted Pre % 55 63  FVC-Post L 2.00 2.17  FVC-Predicted  Post % 67 71  Pre FEV1/FVC % % 62 57  Post FEV1/FCV % % 58 63  FEV1-Pre L 1.02 1.09  FEV1-Predicted Pre % 44 46  FEV1-Post L 1.16 1.36  DLCO UNC% % 78 93  DLCO COR %Predicted % 94 101    TLC L 5.08 4.12  TLC % Predicted % 110 89  RV % Predicted % 148 82    No results found for: NITRICOXIDE      Assessment & Plan:   COPD  GOLD II/III still smoking/ 02 dep  Exacerbation.  Patient is encouraged on smoking cessation.  Will treat with antibiotics and steroids.  She advised if not improving will need further evaluation.  Patient's medications were reviewed today and patient education was given. Computerized medication calendar was adjusted/completed    Plan  Patient Instructions  Doxycycline 100mg  Twice daily  For 1 week , take with food.  Prednisone taper over next week. Call if blood sugar >250 .  Mucinex DM Twice daily  As needed  Cough /congestion  Use oxygen 2l/m  Wear CPAP  At bedtime and with naps. Try to get in at least 4hr .  Work on not smoking . Set a quit date.  Continue on [place]. Rinse after use.  Use Albuterol Neb every 4hrs as needed.  Follow up with PCP or Ortho for left leg injury .  Follow up with with Dr. Sherene Sires  In 2-3  Months  and As needed   Please contact office for sooner follow up if symptoms do not improve or worsen or seek emergency care  Get flu shot next week if better.          Chronic respiratory failure with hypoxia (HCC) Continue on oxygen 2 L  OSA on CPAP Encouraged on CPAP compliance.  Plan  Patient Instructions  Doxycycline 100mg  Twice daily  For 1 week , take with food.  Prednisone taper over next week. Call if blood sugar >250 .  Mucinex DM Twice daily  As needed  Cough /congestion  Use oxygen 2l/m  Wear CPAP  At bedtime and with naps. Try to get in at least 4hr .  Work on not smoking . Set a quit date.  Continue on [place]. Rinse after use.  Use Albuterol Neb every 4hrs as needed.  Follow up with PCP or Ortho for left leg injury .  Follow up with with Dr. Sherene Sires  In 2-3  Months  and As needed   Please contact office for sooner follow up if symptoms do not improve or worsen or seek emergency  care  Get flu shot next week if better.          Obesity, morbid, BMI 50 or higher (HCC) Encouraged on weight loss  Cigarette smoker Encouraged on smoking cessation  Diastolic dysfunction Appears compensated without evidence of fluid overload on exam continue on current regimen  Fall Mechanical fall at home.  Patient had no loss of consciousness or known head injury.  Exam is unrevealing.  Advised patient if symptoms worsen or do not improve she will need further evaluation with primary care physician or orthopedist.  Offered a referral to orthopedist that she sees Dr. due to but were declined today.  Advised to use supportive care ice heat rest.  Please contact office for sooner follow up if symptoms do not improve or worsen or seek emergency care       Rubye Oaks, NP 04/22/2018

## 2018-04-23 NOTE — Progress Notes (Signed)
Chart and office note reviewed in detail  > agree with a/p as outlined    

## 2018-04-25 NOTE — Telephone Encounter (Signed)
E-scribed metformin

## 2018-05-03 ENCOUNTER — Other Ambulatory Visit: Payer: Self-pay | Admitting: Family Medicine

## 2018-05-04 NOTE — Telephone Encounter (Signed)
Medication is not on current med list.  Says it was discontinued on 08/12/2017 by Dr. Sandrea HughsMichael Wert.   Left message on vm per dpr for pt to call back and confirm if she is still using med. If so, does pulmonology prescribe or Dr. Reece AgarG?.

## 2018-05-05 NOTE — Telephone Encounter (Signed)
Spoke pt asking about ProAir Respiclick.  States she no longer uses it and Dr. Sherene SiresWert (pulmo) is who prescribes her inhalers.   Denied refill request.

## 2018-05-11 ENCOUNTER — Other Ambulatory Visit: Payer: Self-pay | Admitting: Family Medicine

## 2018-05-16 ENCOUNTER — Other Ambulatory Visit: Payer: Self-pay | Admitting: Family Medicine

## 2018-05-16 NOTE — Telephone Encounter (Signed)
Name of Medication: Lorazepam Name of [facility]acy: [facility] Last Fill or Written Date and Quantity: 03/19/18, #30/0 Last Office Visit and Type: 08/25/17, acute Next Office Visit and Type: none Last Controlled Substance Agreement Date: none Last UDS: none

## 2018-05-17 NOTE — Telephone Encounter (Signed)
E prescribed Due for OV

## 2018-06-09 ENCOUNTER — Other Ambulatory Visit: Payer: Self-pay | Admitting: Family Medicine

## 2018-06-09 NOTE — Telephone Encounter (Signed)
E-scribed refill.  Please schedule annual CPE. 

## 2018-06-10 ENCOUNTER — Telehealth: Payer: Self-pay | Admitting: Family Medicine

## 2018-06-10 ENCOUNTER — Other Ambulatory Visit: Payer: Self-pay | Admitting: Internal Medicine

## 2018-06-10 MED ORDER — PANTOPRAZOLE SODIUM 40 MG PO TBEC: DELAYED_RELEASE_TABLET | ORAL | 1 refills | Status: DC

## 2018-06-10 NOTE — Telephone Encounter (Signed)
Noted  

## 2018-06-10 NOTE — Telephone Encounter (Signed)
E-scribed refill. Please schedule physical. 

## 2018-06-10 NOTE — Telephone Encounter (Signed)
Labs 2/21 cpx 2/26 Pt aware

## 2018-06-11 ENCOUNTER — Other Ambulatory Visit: Payer: Self-pay | Admitting: Family Medicine

## 2018-06-12 ENCOUNTER — Other Ambulatory Visit: Payer: Self-pay | Admitting: Family Medicine

## 2018-06-13 NOTE — Telephone Encounter (Signed)
Name of Medication: Lorazepam Name of Pharmacy: [facility] Date and Quantity: 05/17/18, #30/0 Last Office Visit and Type: 08/25/17, acute Next Office Visit and Type: 07/27/18, CPE Last Controlled Substance Agreement Date: none Last UDS: none

## 2018-06-14 NOTE — Telephone Encounter (Signed)
Eprescribed.

## 2018-06-20 ENCOUNTER — Encounter: Payer: Self-pay | Admitting: Internal Medicine

## 2018-06-20 ENCOUNTER — Ambulatory Visit (INDEPENDENT_AMBULATORY_CARE_PROVIDER_SITE_OTHER): Payer: Medicaid Other | Admitting: Internal Medicine

## 2018-06-20 VITALS — BP 144/80 | HR 94 | Ht 61.0 in | Wt 262.8 lb

## 2018-06-20 DIAGNOSIS — F17210 Nicotine dependence, cigarettes, uncomplicated: Secondary | ICD-10-CM

## 2018-06-20 DIAGNOSIS — J449 Chronic obstructive pulmonary disease, unspecified: Secondary | ICD-10-CM

## 2018-06-20 DIAGNOSIS — J9611 Chronic respiratory failure with hypoxia: Secondary | ICD-10-CM | POA: Diagnosis not present

## 2018-06-20 MED ORDER — PREDNISONE 10 MG PO TABS: ORAL_TABLET | ORAL | 0 refills | Status: DC

## 2018-06-20 MED ORDER — AMOXICILLIN-POT CLAVULANATE 875-125 MG PO TABS: 1.0000 | ORAL_TABLET | Freq: Two times a day (BID) | ORAL | 0 refills | Status: AC

## 2018-06-20 MED ORDER — MOMETASONE FURO-FORMOTEROL FUM 200-5 MCG/ACT IN AERO: 2.0000 | INHALATION_SPRAY | Freq: Two times a day (BID) | RESPIRATORY_TRACT | 0 refills | Status: DC

## 2018-06-20 NOTE — Progress Notes (Signed)
Subjective:   Patient ID: Brenda [name], female    DOB: Nov 23, 1960    MRN: [id number]   Brief patient profile:  31  yowf  Active smoker wt  07/2015 = 250 and admitted with" aecopd" but with pfts nearly nl 2/29/15 p rx so was characterized as GOLD 0/ AB  But by 09/2017 met criteria for GOLD II    History of Present Illness  10/20/2017  f/u ov/ re:  A B/  still smoking  And now meets criteria for GOLD II maint on dulera/spiriva  Chief Complaint  Patient presents with  . Follow-up    Pt had full PFT prior to OV today. Pt has SOB with exertion, productive cough-yellow, wheezing, some chest tightness. Pt is on 2 liters of O2 con't.  Dyspnea:  Food lion struggle on 3lpm  Cough: better Sleep: cpap/ 30 degrees SABA use:  Needing less  rec No change work on      01/19/2018  f/u ov/ re: GOLD II and still smoking/ maint on symb/spiriva/ 02 2-3lpm and using med calendar well  Chief Complaint  Patient presents with  . Follow-up    pt c/o worsening sob worse with warm weather.     Dyspnea:  MMRC3 = can't walk 100 yards even at a slow pace at a flat grade s stopping due to sob even on 3lpm  Cough: some worse since hot weather . 4-5 days  Sleeping: 30 degrees/cpap 3lpm SABA JJK:KXFG daily though hfa suboptimal/ short ti rec For cough add gabapentin 300 mg at lunch and supper    NP eval   04/22/18 acute flare AB  Doxycycline 140m Twice daily  For 1 week , take with food.  Prednisone taper over next week. Call if blood sugar >250 .  Mucinex DM Twice daily  As needed  Cough /congestion  Use oxygen 2l/m  Wear CPAP  At bedtime and with naps. Try to get in at least 4hr .  Work on not smoking . Set a quit date.  Continue on [place] Rinse after use.  Use Albuterol Neb every 4hrs as needed.  Follow up with PCP or Ortho for left leg injury .  Follow up with with Dr. WMelvyn Novas In 2-3  Months  and As needed   Please contact office for sooner follow up if symptoms do not improve or  worsen or seek emergency care  Get flu shot next week if better.    06/20/2018  f/u ov/ re: gold II copd/ still smoking maint rx dulera 200/spiriva Chief Complaint  Patient presents with  . Follow-up    SOB majority of the time x2 weeks, wheezing, cough with thick yellow mucus   Dyspnea:  Gradually Worse x 2 weeks Cough: thick mucus turning greener esp in am  Sleeping: on cpap/ nasal congestion with dry 02  SABA use: baseline couple times a day at most but x last two weeks 4 x daily and neb which helps more 02: 2-3lpm NP   No obvious day to day or daytime variability or assoc  mucus plugs or hemoptysis or cp or chest tightness, subjective wheeze or overt  hb symptoms.    . Also denies any obvious fluctuation of symptoms with weather or environmental changes or other aggravating or alleviating factors except as outlined above   No unusual exposure hx or h/o childhood pna/ asthma or knowledge of premature birth.  Current Allergies, Complete Past Medical History, Past Surgical History, Family History, and  Social History were reviewed in [employer] record.  ROS  The following are not active complaints unless bolded Hoarseness, sore throat, dysphagia, dental problems, itching, sneezing,  nasal congestion or discharge of excess mucus or purulent secretions, ear ache,   fever, chills, sweats, unintended wt loss or wt gain, classically pleuritic or exertional cp,  orthopnea pnd or arm/hand swelling  or leg swelling, presyncope, palpitations, abdominal pain, anorexia, nausea, vomiting, diarrhea  or change in bowel habits or change in bladder habits, change in stools or change in urine, dysuria, hematuria,  rash, arthralgias, visual complaints, headache, numbness, weakness or ataxia or problems with walking or coordination,  change in mood or  memory.        Current Meds  Medication Sig  . albuterol (PROAIR HFA) 108 (90 Base) MCG/ACT inhaler Inhale 2 puffs into the  lungs every 4 (four) hours as needed for wheezing or shortness of breath (((PLAN B))).  Marland Kitchen albuterol (PROVENTIL) (2.5 MG/3ML) 0.083% nebulizer solution INHALE 1 VIAL VIA NEBULIZER EVERY 6 HOURS AS NEEDED FOR WHEEZING/SHORTNESS OF BREATH  . benzonatate (TESSALON) 200 MG capsule Take 1 capsule (200 mg total) by mouth 3 (three) times daily as needed for cough. Swallow whole, to not bite pill  . cetirizine (ZYRTEC) 10 MG tablet Take 10 mg by mouth daily.   . citalopram (CELEXA) 20 MG tablet TAKE 1 TABLET BY MOUTH EVERY DAY  . dextromethorphan-guaiFENesin (MUCINEX DM) 30-600 MG 12hr tablet Take 1 tablet by mouth 2 (two) times daily as needed for cough.  . famotidine (PEPCID) 20 MG tablet One at bedtime  . fluticasone (FLONASE) 50 MCG/ACT nasal spray SPRAY 2 SPRAYS INTO EACH NOSTRIL EVERY DAY  . furosemide (LASIX) 20 MG tablet TAKE 2 TABLETS BY MOUTH EVERY DAY  . gabapentin (NEURONTIN) 300 MG capsule Take 300 mg by mouth 2 (two) times daily. May take 1 extra twice daily as needed for severe cough  . HYDROcodone-acetaminophen (NORCO/VICODIN) 5-325 MG tablet Take 1 tablet by mouth every 4 (four) hours as needed.  Marland Kitchen LORazepam (ATIVAN) 0.5 MG tablet TAKE 1/2 TO 1 TABLET BY MOUTH TWICE A DAY AS NEEDED FOR ANXIETY  . metFORMIN (GLUCOPHAGE) 500 MG tablet TAKE 1 TABLET BY MOUTH EVERYDAY AT BEDTIME  . mometasone-formoterol (DULERA) 200-5 MCG/ACT AERO Inhale 2 puffs into the lungs 2 (two) times daily.  . Multiple Vitamin (MULTIVITAMIN WITH MINERALS) TABS tablet Take 1 tablet by mouth daily.  Marland Kitchen nystatin (MYCOSTATIN) 100000 UNIT/ML suspension TAKE 1 TEASPOONFUL BY MOUTH 3 TIMES DAILY AS NEEDED FOR MOUTH SORES.  Marland Kitchen OXYGEN 2 lpm with sleep with CPAP and during the day with exertion  . pantoprazole (PROTONIX) 40 MG tablet TAKE 1 TABLET BY MOUTH EVERY DAY 30 TO 60 MINUTES BEFORE THE FIRST MEAL OF THE DAY  . promethazine-dextromethorphan (PROMETHAZINE-DM) 6.25-15 MG/5ML syrup Take 5 mLs by mouth 4 (four) times daily as  needed for cough.  Marland Kitchen spironolactone (ALDACTONE) 25 MG tablet TAKE 1 TABLET BY MOUTH TWICE A DAY  . Tiotropium Bromide Monohydrate (SPIRIVA RESPIMAT) 2.5 MCG/ACT AERS Inhale 2 puffs into the lungs daily.  .     .                   Objective:   Physical Exam  Obese amb wf with pseudowheeze late exp   10/06/2013         288 > 11/17/2013  288> 07/30/2015  266  > 08/12/2017   284 > 09/16/2017  278 > 01/19/2018  270 > . 06/20/2018   262     09/22/13 293 lb 12.8 oz (133.267 kg)  09/18/13 289 lb 4 oz (131.203 kg)  08/21/13 283 lb 8 oz (128.595 kg)      Vital signs reviewed - Note on arrival 02 sats  97% on 2lpm continuous  HEENT: nl dentition / oropharynx. Nl external ear canals without cough reflex -  Mod bilateral non-specific turbinate edema with some crusting  Modified Mallampati Score =   2  NECK :  without JVD/Nodes/TM/ nl carotid upstrokes bilaterally   LUNGS: no acc muscle use,  Mild/mod barrel  contour chest wall with bilateral  Distant bs s audible wheeze and  without cough on insp or exp maneuver and mild  Hyperresonant  to  percussion bilaterally     CV:  RRR  no s3 or murmur or increase in P2, and no edema   ABD:  Quite obese but nontender with pos mid insp Hoover's  in the supine position. No bruits or organomegaly appreciated, bowel sounds nl  MS:   Nl gait/  ext warm without deformities, calf tenderness, cyanosis or clubbing No obvious joint restrictions   SKIN: warm and dry without lesions    NEURO:  alert, approp, nl sensorium with  no motor or cerebellar deficits apparent.           Assessment & Plan:

## 2018-06-20 NOTE — Patient Instructions (Addendum)
Augmentin 875 mg take one pill twice daily  X 10 days - take at breakfast and supper with large glass of water.  It would help reduce the usual side effects (diarrhea and yeast infections) if you ate cultured yogurt at lunch.   Prednisone 10 mg take  4 each am x 2 days,   2 each am x 2 days,  1 each am x 2 days and stop   The key is to stop smoking completely before smoking completely stops you!  For smoking cessation classes call [phone]    Please schedule a follow up office visit in 6 weeks, call sooner if needed

## 2018-06-21 ENCOUNTER — Encounter: Payer: Self-pay | Admitting: Internal Medicine

## 2018-06-21 NOTE — Assessment & Plan Note (Signed)
07/30/2015   Walked RA  2 laps @ 185 ft each stopped due to  Sob with sats still 90% so rec 02 hs only plugged into cpap machine - as of 09/16/2017  02 = 2lpm at rest and 3lpm with activity as may be developing cor pulmonale - 01/19/2018   Walked 2lpm x  2 laps @ 185 ft each stopped due to  Sob with sats ok at moderate pace  Adequate control on present rx, reviewed in detail with pt > no change in rx needed

## 2018-06-21 NOTE — Assessment & Plan Note (Signed)
4-5 min discussion re active cigarette smoking in addition to office E&M  Ask about tobacco use:   ongoing Advise quitting   I emphasized that although we never turn away smokers from the pulmonary clinic, we do ask that they understand that the recommendations that we make  won't work nearly as well in the presence of continued cigarette exposure. In fact, we may very well  reach a point where we can't promise to help the patient if he/she can't quit smoking. (We can and will promise to try to help, we just can't promise what we recommend will really work)  Assess willingness:  Not committed at this point Assist in quit attempt:  Per PCP when ready Arrange follow up:   Follow up per Primary Care planned  For smoking cessation classes call [phone]        I had an extended discussion with the patient reviewing all relevant studies completed to date and  lasting 15 to 20 minutes of a 25 minute visit    See device teaching which extended face to face time for this visit.  Each maintenance medication was reviewed in detail including emphasizing most importantly the difference between maintenance and prns and under what circumstances the prns are to be triggered using an action plan format that is not reflected in the computer generated alphabetically organized AVS which I have not found useful in most complex patients, especially with respiratory illnesses  Please see AVS for specific instructions unique to this visit that I personally wrote and verbalized to the the pt in detail and then reviewed with pt  by my nurse highlighting any  changes in therapy recommended at today's visit to their plan of care.        

## 2018-06-21 NOTE — Assessment & Plan Note (Addendum)
Active smoker Last worked Jun 29 2017 seeking full disability   - PFT's  10/20/2017  FEV1 1.16 (50 % ) ratio 58  p 14 % improvement from saba p dulera/spriva prior to study with DLCO  78 % corrects to 94 % for alv volume  erv  15%   06/20/2018  After extensive coaching inhaler device,  effectiveness =    75% (short Ti)    Continues with poor control of symptoms:   DDX of  difficult airways management almost all start with A and  include Adherence, Ace Inhibitors, Acid Reflux, Active Sinus Disease, Alpha 1 Antitripsin deficiency, Anxiety masquerading as Airways dz,  ABPA,  Allergy(esp in young), Aspiration (esp in elderly), Adverse effects of meds,  Active smoking or vaping, A bunch of PE's (a small clot burden can't cause this syndrome unless there is already severe underlying pulm or vascular dz with poor reserve) plus two Bs  = Bronchiectasis and Beta blocker use..and one C= CHF    Adherence is always the initial "prime suspect" and is a multilayered concern that requires a "trust but verify" approach in every patient - starting with knowing how to use medications, especially inhalers, correctly, keeping up with refills and understanding the fundamental difference between maintenance and prns vs those medications only taken for a very short course and then stopped and not refilled.  - see hfa teaching  Active smoking also top of the list of suspects  ? Active sinus dz > augmentin x 10 days / humidify 02 should help/ continue flonase  ? Allergy component >  Prednisone 10 mg take  4 each am x 2 days,   2 each am x 2 days,  1 each am x 2 days and stop    See calendar for specific medication instructions and bring it back for each and every office visit for every healthcare provider you see.  Without it,  you may not receive the best quality medical care that we feel you deserve.  You will note that the calendar groups together  your maintenance  medications that are timed at particular times of  the day.  Think of this as your checklist for what your doctor has instructed you to do until your next evaluation to see what benefit  there is  to staying on a consistent group of medications intended to keep you well.  The other group at the bottom is entirely up to you to use as you see fit  for specific symptoms that may arise between visits that require you to treat them on an as needed basis.  Think of this as your action plan or "what if" list.   Separating the top medications from the bottom group is fundamental to providing you adequate care going forward.

## 2018-06-21 NOTE — Assessment & Plan Note (Signed)
Body mass index is 49.66 kg/m.  -  trending down/ encouraged Lab Results  Component Value Date   TSH 2.27 07/24/2016     Contributing to gerd risk/ doe/reviewed the need and the process to achieve and maintain neg calorie balance > defer f/u primary care including intermittently monitoring thyroid status

## 2018-06-22 ENCOUNTER — Other Ambulatory Visit: Payer: Self-pay | Admitting: Internal Medicine

## 2018-06-22 MED ORDER — SPIRONOLACTONE 25 MG PO TABS: 25.0000 mg | ORAL_TABLET | Freq: Two times a day (BID) | ORAL | 1 refills | Status: DC

## 2018-06-22 MED ORDER — FUROSEMIDE 20 MG PO TABS: 40.0000 mg | ORAL_TABLET | Freq: Every day | ORAL | 1 refills | Status: DC

## 2018-07-14 ENCOUNTER — Other Ambulatory Visit: Payer: Self-pay | Admitting: Family Medicine

## 2018-07-20 ENCOUNTER — Other Ambulatory Visit: Payer: Self-pay | Admitting: Family Medicine

## 2018-07-20 DIAGNOSIS — I10 Essential (primary) hypertension: Secondary | ICD-10-CM

## 2018-07-20 DIAGNOSIS — E1142 Type 2 diabetes mellitus with diabetic polyneuropathy: Secondary | ICD-10-CM

## 2018-07-20 DIAGNOSIS — E781 Pure hyperglyceridemia: Secondary | ICD-10-CM

## 2018-07-20 DIAGNOSIS — D72829 Elevated white blood cell count, unspecified: Secondary | ICD-10-CM

## 2018-07-22 ENCOUNTER — Other Ambulatory Visit (INDEPENDENT_AMBULATORY_CARE_PROVIDER_SITE_OTHER): Payer: Medicaid Other

## 2018-07-22 DIAGNOSIS — E1142 Type 2 diabetes mellitus with diabetic polyneuropathy: Secondary | ICD-10-CM | POA: Diagnosis not present

## 2018-07-22 DIAGNOSIS — I10 Essential (primary) hypertension: Secondary | ICD-10-CM

## 2018-07-22 DIAGNOSIS — E781 Pure hyperglyceridemia: Secondary | ICD-10-CM | POA: Diagnosis not present

## 2018-07-22 DIAGNOSIS — D72829 Elevated white blood cell count, unspecified: Secondary | ICD-10-CM

## 2018-07-22 LAB — CBC WITH DIFFERENTIAL/PLATELET
Basophils Absolute: 0.1 K/uL (ref 0.0–0.1)
Basophils Relative: 0.6 % (ref 0.0–3.0)
Eosinophils Absolute: 0.5 K/uL (ref 0.0–0.7)
Eosinophils Relative: 3.4 % (ref 0.0–5.0)
HCT: 43 % (ref 36.0–46.0)
Hemoglobin: 14 g/dL (ref 12.0–15.0)
Lymphocytes Relative: 24.3 % (ref 12.0–46.0)
Lymphs Abs: 3.7 K/uL (ref 0.7–4.0)
MCHC: 32.5 g/dL (ref 30.0–36.0)
MCV: 90.8 fl (ref 78.0–100.0)
Monocytes Absolute: 1 K/uL (ref 0.1–1.0)
Monocytes Relative: 6.8 % (ref 3.0–12.0)
Neutro Abs: 10 K/uL — ABNORMAL HIGH (ref 1.4–7.7)
Neutrophils Relative %: 64.9 % (ref 43.0–77.0)
Platelets: 353 K/uL (ref 150.0–400.0)
RBC: 4.74 Mil/uL (ref 3.87–5.11)
RDW: 13.7 % (ref 11.5–15.5)
WBC: 15.4 K/uL — ABNORMAL HIGH (ref 4.0–10.5)

## 2018-07-22 LAB — LIPID PANEL
Cholesterol: 177 mg/dL (ref 0–200)
HDL: 42.2 mg/dL (ref >39.00)
LDL Cholesterol: 95 mg/dL (ref 0–99)
NonHDL: 134.97
Total CHOL/HDL Ratio: 4
Triglycerides: 200 mg/dL — ABNORMAL HIGH (ref 0.0–149.0)
VLDL: 40 mg/dL (ref 0.0–40.0)

## 2018-07-22 LAB — COMPREHENSIVE METABOLIC PANEL
ALT: 22 U/L (ref 0–35)
AST: 15 U/L (ref 0–37)
Albumin: 4.3 g/dL (ref 3.5–5.2)
Alkaline Phosphatase: 120 U/L — ABNORMAL HIGH (ref 39–117)
BUN: 17 mg/dL (ref 6–23)
CO2: 32 mEq/L (ref 19–32)
Calcium: 9.9 mg/dL (ref 8.4–10.5)
Chloride: 98 mEq/L (ref 96–112)
Creatinine, Ser: 1.18 mg/dL (ref 0.40–1.20)
GFR: 46.72 mL/min — ABNORMAL LOW (ref >60.00)
Glucose, Bld: 129 mg/dL — ABNORMAL HIGH (ref 70–99)
Potassium: 4.2 mEq/L (ref 3.5–5.1)
Sodium: 140 mEq/L (ref 135–145)
Total Bilirubin: 0.3 mg/dL (ref 0.2–1.2)
Total Protein: 7.6 g/dL (ref 6.0–8.3)

## 2018-07-22 LAB — MICROALBUMIN / CREATININE URINE RATIO
Creatinine,U: 78.1 mg/dL
Microalb Creat Ratio: 2.2 mg/g (ref 0.0–30.0)
Microalb, Ur: 1.7 mg/dL (ref 0.0–1.9)

## 2018-07-22 LAB — HEMOGLOBIN A1C: Hgb A1c MFr Bld: 6.5 % (ref 4.6–6.5)

## 2018-07-27 ENCOUNTER — Encounter: Payer: Self-pay | Admitting: Family Medicine

## 2018-07-27 ENCOUNTER — Ambulatory Visit (INDEPENDENT_AMBULATORY_CARE_PROVIDER_SITE_OTHER): Payer: Medicaid Other | Admitting: Family Medicine

## 2018-07-27 VITALS — BP 120/78 | HR 88 | Temp 98.9°F | Ht 60.5 in | Wt 251.2 lb

## 2018-07-27 DIAGNOSIS — I10 Essential (primary) hypertension: Secondary | ICD-10-CM

## 2018-07-27 DIAGNOSIS — F17210 Nicotine dependence, cigarettes, uncomplicated: Secondary | ICD-10-CM

## 2018-07-27 DIAGNOSIS — E781 Pure hyperglyceridemia: Secondary | ICD-10-CM

## 2018-07-27 DIAGNOSIS — I2781 Cor pulmonale (chronic): Secondary | ICD-10-CM

## 2018-07-27 DIAGNOSIS — Z1211 Encounter for screening for malignant neoplasm of colon: Secondary | ICD-10-CM | POA: Diagnosis not present

## 2018-07-27 DIAGNOSIS — J449 Chronic obstructive pulmonary disease, unspecified: Secondary | ICD-10-CM

## 2018-07-27 DIAGNOSIS — Z0000 Encounter for general adult medical examination without abnormal findings: Secondary | ICD-10-CM

## 2018-07-27 DIAGNOSIS — F4321 Adjustment disorder with depressed mood: Secondary | ICD-10-CM

## 2018-07-27 DIAGNOSIS — E1142 Type 2 diabetes mellitus with diabetic polyneuropathy: Secondary | ICD-10-CM

## 2018-07-27 DIAGNOSIS — Z7189 Other specified counseling: Secondary | ICD-10-CM | POA: Diagnosis not present

## 2018-07-27 DIAGNOSIS — D72829 Elevated white blood cell count, unspecified: Secondary | ICD-10-CM

## 2018-07-27 DIAGNOSIS — Z23 Encounter for immunization: Secondary | ICD-10-CM

## 2018-07-27 DIAGNOSIS — Z6841 Body Mass Index (BMI) 40.0 and over, adult: Secondary | ICD-10-CM

## 2018-07-27 DIAGNOSIS — J4530 Mild persistent asthma, uncomplicated: Secondary | ICD-10-CM

## 2018-07-27 DIAGNOSIS — J9611 Chronic respiratory failure with hypoxia: Secondary | ICD-10-CM

## 2018-07-27 DIAGNOSIS — F4322 Adjustment disorder with anxiety: Secondary | ICD-10-CM

## 2018-07-27 MED ORDER — VARENICLINE TARTRATE 1 MG PO TABS: 1.0000 mg | ORAL_TABLET | Freq: Two times a day (BID) | ORAL | 1 refills | Status: DC

## 2018-07-27 MED ORDER — VARENICLINE TARTRATE 0.5 MG X 11 & 1 MG X 42 PO MISC: ORAL | 0 refills | Status: DC

## 2018-07-27 MED ORDER — ATORVASTATIN CALCIUM 20 MG PO TABS: 20.0000 mg | ORAL_TABLET | Freq: Every day | ORAL | 3 refills | Status: DC

## 2018-07-27 MED ORDER — ESCITALOPRAM OXALATE 20 MG PO TABS: 20.0000 mg | ORAL_TABLET | Freq: Every day | ORAL | 3 refills | Status: DC

## 2018-07-27 MED ORDER — GABAPENTIN 300 MG PO CAPS: ORAL_CAPSULE | ORAL | 1 refills | Status: DC

## 2018-07-27 NOTE — Progress Notes (Signed)
BP 120/78 (BP Location: Right Arm, Patient Position: Sitting, Cuff Size: Large)   Pulse 88   Temp 98.9 F (37.2 C) (Oral)   Ht 5' 0.5" (1.537 m)   Wt 251 lb 3 oz (113.9 kg)   LMP 03/08/2013   SpO2 96% Comment: 2 L, continuous  BMI 48.25 kg/m    CC: CPE Subjective:    Patient ID: Brenda [name], female    DOB: Oct 02, 1960, 58 y.o.   MRN: [id number]  HPI: Brenda [name] is a 59 y.o. female presenting on 07/27/2018 for Annual Exam (Wants to discuss smoking.  Also, wants to discuss Celexa.)   Adjustment disorder with anxiety after husband's passing - continues celexa 20mg  daily and lorazepam. Feels apathy, asks about increased celexa dose. Ongoing grieving after husband's death September 28, 2016. No SI/HI. Cares for grandchild.   Oxygen dependent asthmatic bronchitis (2L by [place]) - sees pulm [facility]. Upcoming appt next week. On dulera and spiriva.   Continued smoker - down to 6-7 cig/day. Interested in chantix. Hasn't tried anything in the past.   Lung CT from 09-28-17 showed plaque buildup of thoracic aorta and severe fatty liver.   DM with neuropathy as well as L radiculopathy on gabapentin takes 300mg  twice daily. Worse at night time. Requests increased gabapentin dose. Taking metformin 500mg  daily.   She has dropped 20 lbs since august! Cut back on sodas and candy.   Preventative: Colon cancer screening - discussed, normal iFOB 09/28/2016.  Mammogram - 03/2017 WNL. Due for rpt Well woman exam - never abnormal cervical screen, normal 09-29-2014, 09/2015. Consider Q3-5 yrs. H/o BTL.  Flu yearly Pneumovax 2003, 07/2015 Tdap 2016-09-28.  shingrix - discussed.  Advanced directive - Avon Gully is daughter Shanda Bumps). Has advanced directive at home. Will bring me copy.  Seat belt use discussed Sunscreen use discussed, no changing moles on skin. 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 Interim medical history since our last visit reviewed. Allergies and medications reviewed and updated. Outpatient Medications Prior to Visit  Medication Sig Dispense Refill  . albuterol (PROAIR HFA) 108 (90 Base) MCG/ACT inhaler Inhale 2 puffs into the lungs every 4 (four) hours as needed for wheezing or shortness of breath (((PLAN B))).    Marland Kitchen albuterol (PROVENTIL) (2.5 MG/3ML) 0.083% nebulizer solution INHALE 1 VIAL VIA NEBULIZER EVERY 6 HOURS AS NEEDED FOR WHEEZING/SHORTNESS OF BREATH 150 mL 1  . benzonatate (TESSALON) 200 MG capsule Take 1 capsule (200 mg total) by mouth 3 (three) times daily as needed for cough. Swallow whole, to not bite pill 45 capsule 2  . cetirizine (ZYRTEC) 10 MG tablet Take 10 mg by mouth daily.     Marland Kitchen dextromethorphan-guaiFENesin (MUCINEX DM) 30-600 MG 12hr tablet Take 1 tablet by mouth 2 (two) times daily as needed for cough.    . famotidine (PEPCID) 20 MG tablet One at bedtime 30 tablet 11  . fluticasone (FLONASE) 50 MCG/ACT nasal spray SPRAY 2 SPRAYS INTO EACH NOSTRIL EVERY DAY 48 g 0  . furosemide (LASIX) 20 MG tablet Take 2 tablets (40 mg total) by mouth daily. 180 tablet 1  . LORazepam (ATIVAN) 0.5 MG tablet TAKE 1/2 TO 1 TABLET BY MOUTH TWICE A DAY  AS NEEDED FOR ANXIETY 30 tablet 0  . metFORMIN (GLUCOPHAGE) 500 MG tablet TAKE 1 TABLET BY MOUTH EVERYDAY AT BEDTIME 90 tablet 0  . mometasone-formoterol (DULERA) 200-5 MCG/ACT AERO Inhale 2 puffs into the lungs 2 (two) times daily. 1 Inhaler 0  . Multiple Vitamin (MULTIVITAMIN WITH MINERALS) TABS tablet Take 1 tablet by mouth daily.    Marland Kitchen nystatin (MYCOSTATIN) 100000 UNIT/ML suspension TAKE 1 TEASPOONFUL BY MOUTH 3 TIMES DAILY AS NEEDED FOR MOUTH SORES. 150 mL 1  . OXYGEN 2 lpm with sleep with CPAP and during the day with exertion    .  pantoprazole (PROTONIX) 40 MG tablet TAKE 1 TABLET BY MOUTH EVERY DAY 30 TO 60 MINUTES BEFORE THE FIRST MEAL OF THE DAY 90 tablet 1  . promethazine-dextromethorphan (PROMETHAZINE-DM) 6.25-15 MG/5ML syrup Take 5 mLs by mouth 4 (four) times daily as needed for cough. 150 mL 0  . spironolactone (ALDACTONE) 25 MG tablet Take 1 tablet (25 mg total) by mouth 2 (two) times daily. 180 tablet 1  . Tiotropium Bromide Monohydrate (SPIRIVA RESPIMAT) 2.5 MCG/ACT AERS Inhale 2 puffs into the lungs daily. 1 Inhaler 11  . citalopram (CELEXA) 20 MG tablet TAKE 1 TABLET BY MOUTH EVERY DAY 90 tablet 0  . gabapentin (NEURONTIN) 300 MG capsule Take 300 mg by mouth 2 (two) times daily. May take 1 extra twice daily as needed for severe cough    . mometasone-formoterol (DULERA) 200-5 MCG/ACT AERO Inhale 2 puffs into the lungs 2 (two) times daily. 1 Inhaler 11  . HYDROcodone-acetaminophen (NORCO/VICODIN) 5-325 MG tablet Take 1 tablet by mouth every 4 (four) hours as needed. 16 tablet 0  . predniSONE (DELTASONE) 10 MG tablet Take  4 each am x 2 days,   2 each am x 2 days,  1 each am x 2 days and stop 14 tablet 0   No facility-administered medications prior to visit.      Per HPI unless specifically indicated in ROS section below Review of Systems  Constitutional: Negative for activity change, appetite change, chills, fatigue, fever and unexpected weight change.  HENT: Negative for hearing loss.   Eyes: Negative for visual disturbance.  Respiratory: Positive for wheezing. Negative for cough, chest tightness and shortness of breath.   Cardiovascular: Negative for chest pain, palpitations and leg swelling.  Gastrointestinal: Positive for nausea. Negative for abdominal distention, abdominal pain, blood in stool, constipation, diarrhea and vomiting.  Genitourinary: Negative for difficulty urinating and hematuria.  Musculoskeletal: Negative for arthralgias, myalgias and neck pain.  Skin: Negative for rash.  Neurological:  Negative for dizziness, seizures, syncope and headaches.  Hematological: Negative for adenopathy. Does not bruise/bleed easily.  Psychiatric/Behavioral: Positive for dysphoric mood. The patient is nervous/anxious.    Objective:    BP 120/78 (BP Location: Right Arm, Patient Position: Sitting, Cuff Size: Large)   Pulse 88   Temp 98.9 F (37.2 C) (Oral)   Ht 5' 0.5" (1.537 m)   Wt 251 lb 3 oz (113.9 kg)   LMP 03/08/2013   SpO2 96% Comment: 2 L, continuous  BMI 48.25 kg/m   Wt Readings from Last 3 Encounters:  07/27/18 251 lb 3 oz (113.9 kg)  06/20/18 262 lb 12.8 oz (119.2 kg)  01/19/18 270 lb (122.5 kg)    Physical Exam Vitals signs and nursing note reviewed.  Constitutional:      General: She is not in acute distress.    Appearance: Normal appearance. She is well-developed.  HENT:     Head: Normocephalic  and atraumatic.     Right Ear: Hearing, tympanic membrane, ear canal and external ear normal.     Left Ear: Hearing, tympanic membrane, ear canal and external ear normal.     Nose: Nose normal.     Mouth/Throat:     Mouth: Mucous membranes are moist.     Pharynx: Uvula midline. No oropharyngeal exudate or posterior oropharyngeal erythema.  Eyes:     General: No scleral icterus.    Conjunctiva/sclera: Conjunctivae normal.     Pupils: Pupils are equal, round, and reactive to light.  Neck:     Musculoskeletal: Normal range of motion and neck supple.     Vascular: No carotid bruit.  Cardiovascular:     Rate and Rhythm: Normal rate and regular rhythm.     Pulses: Normal pulses.          Radial pulses are 2+ on the right side and 2+ on the left side.     Heart sounds: Normal heart sounds. No murmur.  Pulmonary:     Effort: Pulmonary effort is normal. No respiratory distress.     Breath sounds: Normal breath sounds. No wheezing, rhonchi or rales.  Abdominal:     General: Bowel sounds are normal. There is no distension.     Palpations: Abdomen is soft. There is no mass.      Tenderness: There is no abdominal tenderness. There is no guarding or rebound.  Musculoskeletal: Normal range of motion.  Lymphadenopathy:     Cervical: No cervical adenopathy.  Skin:    General: Skin is warm and dry.     Findings: No rash.  Neurological:     General: No focal deficit present.     Mental Status: She is alert and oriented to person, place, and time.     Comments: CN grossly intact, station and gait intact  Psychiatric:        Behavior: Behavior normal.        Thought Content: Thought content normal.        Judgment: Judgment normal.       Results for orders placed or performed in visit on 07/22/18  CBC with Differential/Platelet  Result Value Ref Range   WBC 15.4 (H) 4.0 - 10.5 K/uL   RBC 4.74 3.87 - 5.11 Mil/uL   Hemoglobin 14.0 12.0 - 15.0 g/dL   HCT 16.1 09.6 - 04.5 %   MCV 90.8 78.0 - 100.0 fl   MCHC 32.5 30.0 - 36.0 g/dL   RDW 40.9 81.1 - 91.4 %   Platelets 353.0 150.0 - 400.0 K/uL   Neutrophils Relative % 64.9 43.0 - 77.0 %   Lymphocytes Relative 24.3 12.0 - 46.0 %   Monocytes Relative 6.8 3.0 - 12.0 %   Eosinophils Relative 3.4 0.0 - 5.0 %   Basophils Relative 0.6 0.0 - 3.0 %   Neutro Abs 10.0 (H) 1.4 - 7.7 K/uL   Lymphs Abs 3.7 0.7 - 4.0 K/uL   Monocytes Absolute 1.0 0.1 - 1.0 K/uL   Eosinophils Absolute 0.5 0.0 - 0.7 K/uL   Basophils Absolute 0.1 0.0 - 0.1 K/uL  Microalbumin / creatinine urine ratio  Result Value Ref Range   Microalb, Ur 1.7 0.0 - 1.9 mg/dL   Creatinine,U 78.2 mg/dL   Microalb Creat Ratio 2.2 0.0 - 30.0 mg/g  Hemoglobin A1c  Result Value Ref Range   Hgb A1c MFr Bld 6.5 4.6 - 6.5 %  Comprehensive metabolic panel  Result Value Ref Range   Sodium  140 135 - 145 mEq/L   Potassium 4.2 3.5 - 5.1 mEq/L   Chloride 98 96 - 112 mEq/L   CO2 32 19 - 32 mEq/L   Glucose, Bld 129 (H) 70 - 99 mg/dL   BUN 17 6 - 23 mg/dL   Creatinine, Ser 1.61 0.40 - 1.20 mg/dL   Total Bilirubin 0.3 0.2 - 1.2 mg/dL   Alkaline Phosphatase 120 (H) 39 -  117 U/L   AST 15 0 - 37 U/L   ALT 22 0 - 35 U/L   Total Protein 7.6 6.0 - 8.3 g/dL   Albumin 4.3 3.5 - 5.2 g/dL   Calcium 9.9 8.4 - 09.6 mg/dL   GFR 04.54 (L) >09.81 mL/min  Lipid panel  Result Value Ref Range   Cholesterol 177 0 - 200 mg/dL   Triglycerides 191.4 (H) 0.0 - 149.0 mg/dL   HDL 78.29 >56.21 mg/dL   VLDL 30.8 0.0 - 65.7 mg/dL   LDL Cholesterol 95 0 - 99 mg/dL   Total CHOL/HDL Ratio 4    NonHDL 134.97    Depression screen [facility] 2/9 07/27/2018 02/24/2017  Decreased Interest 1 3  Down, Depressed, Hopeless 1 3  PHQ - 2 Score 2 6  Altered sleeping 3 2  Tired, decreased energy 3 3  Change in appetite 0 2  Feeling bad or failure about yourself  0 0  Trouble concentrating 1 2  Moving slowly or fidgety/restless 2 0  Suicidal thoughts 0 0  PHQ-9 Score 11 15    GAD 7 : Generalized Anxiety Score 07/27/2018  Nervous, Anxious, on Edge 2  Control/stop worrying 1  Worry too much - different things 1  Trouble relaxing 2  Restless 3  Easily annoyed or irritable 1  Afraid - awful might happen 1  Total GAD 7 Score 11   Assessment & Plan:   Problem List Items Addressed This Visit    Type 2 diabetes, controlled, with peripheral neuropathy (HCC)    Chronic, stable on metformin - continue. Start statin. RTC 4 mo DM f/u visit.       Relevant Medications   varenicline (CHANTIX CONTINUING MONTH PAK) 1 MG tablet   varenicline (CHANTIX STARTING MONTH PAK) 0.5 MG X 11 & 1 MG X 42 tablet   escitalopram (LEXAPRO) 20 MG tablet   gabapentin (NEURONTIN) 300 MG capsule   atorvastatin (LIPITOR) 20 MG tablet   Morbid obesity with BMI of 45.0-49.9, adult (HCC)    Congratulated on weight loss to date - she is motivated to continue healthy changes.      Leukocytosis    Anticipate steroid related degranulation - would like to check once off steroids for good period of time.       HYPERTRIGLYCERIDEMIA    Chronic trig mildly elevated off statin. Discussed with patient, she agrees to start  lipitor  daily sent to pharmacy.  The 10-year ASCVD risk score Denman George DC Montez Hageman., et al., 2013) is: 16.4%   Values used to calculate the score:     Age: 10 years     Sex: Female     Is Non-Hispanic African American: No     Diabetic: Yes     Tobacco smoker: Yes     Systolic Blood Pressure: 120 mmHg     Is BP treated: Yes     HDL Cholesterol: 42.2 mg/dL     Total Cholesterol: 177 mg/dL       Relevant Medications   atorvastatin (LIPITOR) 20 MG tablet  HYPERTENSION, BENIGN ESSENTIAL    Chronic, stable on current regimen - continue      Relevant Medications   atorvastatin (LIPITOR) 20 MG tablet   Health maintenance examination - Primary    Preventative protocols reviewed and updated unless pt declined. Discussed healthy diet and lifestyle.       Grief   Cor pulmonale (chronic) (HCC)   Relevant Medications   atorvastatin (LIPITOR) 20 MG tablet   COPD  GOLD II/III still smoking/ 02 dep    Relevant Medications   varenicline (CHANTIX CONTINUING MONTH PAK) 1 MG tablet   varenicline (CHANTIX STARTING MONTH PAK) 0.5 MG X 11 & 1 MG X 42 tablet   Cigarette smoker    Discussed options - pt interested in chantix. Discussed mechanism of action as well as common side effects and adverse effects including but not limited to nausea/vomiting, vivid dreams, behaviour changes, discussed possible CV risk association. Pt requests trial of chantix - printed out Rx for starter and continuing month packs      Chronic respiratory failure with hypoxia (HCC)   Asthmatic bronchitis    Stable period improvement noted with weight loss, chronic oxygen and current resipratory regimen - appreciate pulm care.       Advanced care planning/counseling discussion    Advanced directive - [place] Gully is daughter Shanda Bumps). Has advanced directive at home. Will bring me copy.       Adjustment disorder with anxiety    Discussed options - will trial lexapro  in place of celexa. Continue lorazepam.        Other  Visit Diagnoses    Need for influenza vaccination       Relevant Orders   Flu Vaccine QUAD 36+ mos IM (Completed)   Special screening for malignant neoplasms, colon       Relevant Orders   Fecal occult blood, imunochemical       Meds ordered this encounter  Medications  . varenicline (CHANTIX CONTINUING MONTH PAK) 1 MG tablet    Sig: Take 1 tablet (1 mg total) by mouth 2 (two) times daily.    Dispense:  60 tablet    Refill:  1  . varenicline (CHANTIX STARTING MONTH PAK) 0.5 MG X 11 & 1 MG X 42 tablet    Sig: Take one 0.5 mg tablet by mouth once daily for 3 days, then increase to one 0.5 mg tablet twice daily for 4 days, then increase to one 1 mg tablet twice daily.    Dispense:  53 tablet    Refill:  0  . escitalopram (LEXAPRO) 20 MG tablet    Sig: Take 1 tablet (20 mg total) by mouth daily.    Dispense:  30 tablet    Refill:  3  . gabapentin (NEURONTIN) 300 MG capsule    Sig: Take 1 tablet in the morning and 2 at night    Dispense:  270 capsule    Refill:  1  . atorvastatin (LIPITOR) 20 MG tablet    Sig: Take 1 tablet (20 mg total) by mouth daily at 6 PM.    Dispense:  90 tablet    Refill:  3   Orders Placed This Encounter  Procedures  . Fecal occult blood, imunochemical    Standing Status:   Future    Standing Expiration Date:   07/28/2019  . Flu Vaccine QUAD 36+ mos IM    Follow up plan: Return in about 4 months (around 11/25/2018) for follow up visit.  [name],  MD

## 2018-07-27 NOTE — Assessment & Plan Note (Signed)
Advanced directive - HCPOA is daughter (Jessica). Has advanced directive at home. Will bring me copy.  

## 2018-07-27 NOTE — Assessment & Plan Note (Addendum)
Chronic trig mildly elevated off statin. Discussed with patient, she agrees to start lipitor 20mg  daily sent to pharmacy.  The 10-year ASCVD risk score Denman George DC Montez Hageman., et al., 2013) is: 16.4%   Values used to calculate the score:     Age: 58 years     Sex: Female     Is Non-Hispanic African American: No     Diabetic: Yes     Tobacco smoker: Yes     Systolic Blood Pressure: 120 mmHg     Is BP treated: Yes     HDL Cholesterol: 42.2 mg/dL     Total Cholesterol: 177 mg/dL

## 2018-07-27 NOTE — Assessment & Plan Note (Signed)
Discussed options - will trial lexapro 20mg  in place of celexa. Continue lorazepam.

## 2018-07-27 NOTE — Assessment & Plan Note (Signed)
Discussed options - pt interested in chantix. Discussed mechanism of action as well as common side effects and adverse effects including but not limited to nausea/vomiting, vivid dreams, behaviour changes, discussed possible CV risk association. Pt requests trial of chantix - printed out Rx for starter and continuing month packs

## 2018-07-27 NOTE — Patient Instructions (Addendum)
Flu shot today Ok to try chantix - printed out today. Starter month pack then continuing month pack for total 3 months.  Stop celexa, start lexapro 71m daily.  Pass by lab to pick up stool kit. Call to schedule mammogram and eye exam.  If interested, check with pharmacy about new 2 shot shingles series (shingrix).  Bring me copy of your updated advanced directives at your convenience.  Return in 4 months for diabetes follow up visit with labs.   Health Maintenance for Postmenopausal Women Menopause is a normal process in which your reproductive ability comes to an end. This process happens gradually over a span of months to years, usually between the ages of 452and 556 Menopause is complete when you have missed 12 consecutive menstrual periods. It is important to talk with your health care provider about some of the most common conditions that affect postmenopausal women, such as heart disease, cancer, and bone loss (osteoporosis). Adopting a healthy lifestyle and getting preventive care can help to promote your health and wellness. Those actions can also lower your chances of developing some of these common conditions. What should I know about menopause? During menopause, you may experience a number of symptoms, such as:  Moderate-to-severe hot flashes.  Night sweats.  Decrease in sex drive.  Mood swings.  Headaches.  Tiredness.  Irritability.  Memory problems.  Insomnia. Choosing to treat or not to treat menopausal changes is an individual decision that you make with your health care provider. What should I know about hormone replacement therapy and supplements? Hormone therapy products are effective for treating symptoms that are associated with menopause, such as hot flashes and night sweats. Hormone replacement carries certain risks, especially as you become older. If you are thinking about using estrogen or estrogen with progestin treatments, discuss the benefits and risks  with your health care provider. What should I know about heart disease and stroke? Heart disease, heart attack, and stroke become more likely as you age. This may be due, in part, to the hormonal changes that your body experiences during menopause. These can affect how your body processes dietary fats, triglycerides, and cholesterol. Heart attack and stroke are both medical emergencies. There are many things that you can do to help prevent heart disease and stroke:  Have your blood pressure checked at least every 1-2 years. High blood pressure causes heart disease and increases the risk of stroke.  If you are 51077years old, ask your health care provider if you should take aspirin to prevent a heart attack or a stroke.  Do not use any tobacco products, including cigarettes, chewing tobacco, or electronic cigarettes. If you need help quitting, ask your health care provider.  It is important to eat a healthy diet and maintain a healthy weight. ? Be sure to include plenty of vegetables, fruits, low-fat dairy products, and lean protein. ? Avoid eating foods that are high in solid fats, added sugars, or salt (sodium).  Get regular exercise. This is one of the most important things that you can do for your health. ? Try to exercise for at least 150 minutes each week. The type of exercise that you do should increase your heart rate and make you sweat. This is known as moderate-intensity exercise. ? Try to do strengthening exercises at least twice each week. Do these in addition to the moderate-intensity exercise.  Know your numbers.Ask your health care provider to check your cholesterol and your blood glucose. Continue to have your blood  tested as directed by your health care provider.  What should I know about cancer screening? There are several types of cancer. Take the following steps to reduce your risk and to catch any cancer development as early as possible. Breast Cancer  Practice breast  self-awareness. ? This means understanding how your breasts normally appear and feel. ? It also means doing regular breast self-exams. Let your health care provider know about any changes, no matter how small.  If you are 75 or older, have a clinician do a breast exam (clinical breast exam or CBE) every year. Depending on your age, family history, and medical history, it may be recommended that you also have a yearly breast X-ray (mammogram).  If you have a family history of breast cancer, talk with your health care provider about genetic screening.  If you are at high risk for breast cancer, talk with your health care provider about having an MRI and a mammogram every year.  Breast cancer (BRCA) gene test is recommended for women who have family members with BRCA-related cancers. Results of the assessment will determine the need for genetic counseling and BRCA1 and for BRCA2 testing. BRCA-related cancers include these types: ? Breast. This occurs in males or females. ? Ovarian. ? Tubal. This may also be called fallopian tube cancer. ? Cancer of the abdominal or pelvic lining (peritoneal cancer). ? Prostate. ? Pancreatic. Cervical, Uterine, and Ovarian Cancer Your health care provider may recommend that you be screened regularly for cancer of the pelvic organs. These include your ovaries, uterus, and vagina. This screening involves a pelvic exam, which includes checking for microscopic changes to the surface of your cervix (Pap test).  For women ages 21-65, health care providers may recommend a pelvic exam and a Pap test every three years. For women ages 35-65, they may recommend the Pap test and pelvic exam, combined with testing for human papilloma virus (HPV), every five years. Some types of HPV increase your risk of cervical cancer. Testing for HPV may also be done on women of any age who have unclear Pap test results.  Other health care providers may not recommend any screening for  nonpregnant women who are considered low risk for pelvic cancer and have no symptoms. Ask your health care provider if a screening pelvic exam is right for you.  If you have had past treatment for cervical cancer or a condition that could lead to cancer, you need Pap tests and screening for cancer for at least 20 years after your treatment. If Pap tests have been discontinued for you, your risk factors (such as having a new sexual partner) need to be reassessed to determine if you should start having screenings again. Some women have medical problems that increase the chance of getting cervical cancer. In these cases, your health care provider may recommend that you have screening and Pap tests more often.  If you have a family history of uterine cancer or ovarian cancer, talk with your health care provider about genetic screening.  If you have vaginal bleeding after reaching menopause, tell your health care provider.  There are currently no reliable tests available to screen for ovarian cancer. Lung Cancer Lung cancer screening is recommended for adults 31-63 years old who are at high risk for lung cancer because of a history of smoking. A yearly low-dose CT scan of the lungs is recommended if you:  Currently smoke.  Have a history of at least 30 pack-years of smoking and you currently  smoke or have quit within the past 15 years. A pack-year is smoking an average of one pack of cigarettes per day for one year. Yearly screening should:  Continue until it has been 15 years since you quit.  Stop if you develop a health problem that would prevent you from having lung cancer treatment. Colorectal Cancer  This type of cancer can be detected and can often be prevented.  Routine colorectal cancer screening usually begins at age 67 and continues through age 37.  If you have risk factors for colon cancer, your health care provider may recommend that you be screened at an earlier age.  If you have  a family history of colorectal cancer, talk with your health care provider about genetic screening.  Your health care provider may also recommend using home test kits to check for hidden blood in your stool.  A small camera at the end of a tube can be used to examine your colon directly (sigmoidoscopy or colonoscopy). This is done to check for the earliest forms of colorectal cancer.  Direct examination of the colon should be repeated every 5-10 years until age 38. However, if early forms of precancerous polyps or small growths are found or if you have a family history or genetic risk for colorectal cancer, you may need to be screened more often. Skin Cancer  Check your skin from head to toe regularly.  Monitor any moles. Be sure to tell your health care provider: ? About any new moles or changes in moles, especially if there is a change in a mole's shape or color. ? If you have a mole that is larger than the size of a pencil eraser.  If any of your family members has a history of skin cancer, especially at a young age, talk with your health care provider about genetic screening.  Always use sunscreen. Apply sunscreen liberally and repeatedly throughout the day.  Whenever you are outside, protect yourself by wearing long sleeves, pants, a wide-brimmed hat, and sunglasses. What should I know about osteoporosis? Osteoporosis is a condition in which bone destruction happens more quickly than new bone creation. After menopause, you may be at an increased risk for osteoporosis. To help prevent osteoporosis or the bone fractures that can happen because of osteoporosis, the following is recommended:  If you are 7-68 years old, get at least 1,000 mg of calcium and at least 600 mg of vitamin D per day.  If you are older than age 12 but younger than age 80, get at least 1,200 mg of calcium and at least 600 mg of vitamin D per day.  If you are older than age 39, get at least 1,200 mg of calcium and  at least 800 mg of vitamin D per day. Smoking and excessive alcohol intake increase the risk of osteoporosis. Eat foods that are rich in calcium and vitamin D, and do weight-bearing exercises several times each week as directed by your health care provider. What should I know about how menopause affects my mental health? Depression may occur at any age, but it is more common as you become older. Common symptoms of depression include:  Low or sad mood.  Changes in sleep patterns.  Changes in appetite or eating patterns.  Feeling an overall lack of motivation or enjoyment of activities that you previously enjoyed.  Frequent crying spells. Talk with your health care provider if you think that you are experiencing depression. What should I know about immunizations? It is  important that you get and maintain your immunizations. These include:  Tetanus, diphtheria, and pertussis (Tdap) booster vaccine.  Influenza every year before the flu season begins.  Pneumonia vaccine.  Shingles vaccine. Your health care provider may also recommend other immunizations. This information is not intended to replace advice given to you by your health care provider. Make sure you discuss any questions you have with your health care provider. Document Released: 07/10/2005 Document Revised: 12/06/2015 Document Reviewed: 02/19/2015 Elsevier Interactive Patient Education  2019 [employer].

## 2018-07-27 NOTE — Assessment & Plan Note (Addendum)
Chronic, stable on current regimen - continue. 

## 2018-07-27 NOTE — Assessment & Plan Note (Signed)
Preventative protocols reviewed and updated unless pt declined. Discussed healthy diet and lifestyle.  

## 2018-07-27 NOTE — Assessment & Plan Note (Addendum)
Anticipate steroid related degranulation - would like to check once off steroids for good period of time.

## 2018-07-27 NOTE — Assessment & Plan Note (Signed)
Chronic, stable on metformin - continue. Start statin. RTC 4 mo DM f/u visit.

## 2018-07-27 NOTE — Assessment & Plan Note (Addendum)
Congratulated on weight loss to date - she is motivated to continue healthy changes.

## 2018-07-27 NOTE — Assessment & Plan Note (Signed)
Stable period improvement noted with weight loss, chronic oxygen and current resipratory regimen - appreciate pulm care.

## 2018-07-28 ENCOUNTER — Other Ambulatory Visit: Payer: Self-pay | Admitting: Family Medicine

## 2018-07-29 NOTE — Telephone Encounter (Signed)
Name of Medication: Lorazepam Name of Pharmacy: [facility] Last Fill or Written Date and Quantity: 06/14/18, #30 Last Office Visit and Type: 07/27/18, CPE Next Office Visit and Type: 11/28/18, 4 mo DM f/u Last Controlled Substance Agreement Date: none Last UDS: none

## 2018-07-29 NOTE — Telephone Encounter (Signed)
Eprescribed.

## 2018-08-01 ENCOUNTER — Encounter: Payer: Self-pay | Admitting: Internal Medicine

## 2018-08-01 ENCOUNTER — Ambulatory Visit (INDEPENDENT_AMBULATORY_CARE_PROVIDER_SITE_OTHER): Payer: Medicaid Other | Admitting: Internal Medicine

## 2018-08-01 VITALS — BP 130/70 | HR 86 | Ht 61.0 in | Wt 252.0 lb

## 2018-08-01 DIAGNOSIS — F17210 Nicotine dependence, cigarettes, uncomplicated: Secondary | ICD-10-CM | POA: Diagnosis not present

## 2018-08-01 DIAGNOSIS — Z9989 Dependence on other enabling machines and devices: Secondary | ICD-10-CM

## 2018-08-01 DIAGNOSIS — J449 Chronic obstructive pulmonary disease, unspecified: Secondary | ICD-10-CM | POA: Diagnosis not present

## 2018-08-01 DIAGNOSIS — G4733 Obstructive sleep apnea (adult) (pediatric): Secondary | ICD-10-CM | POA: Diagnosis not present

## 2018-08-01 DIAGNOSIS — J9611 Chronic respiratory failure with hypoxia: Secondary | ICD-10-CM

## 2018-08-01 NOTE — Progress Notes (Signed)
Subjective:   Patient ID: Brenda [name], female    DOB: 1961-04-07    MRN: [id number]   Brief patient profile:  74  yowf  Active smoker wt  07/2015 = 250 and admitted with" aecopd" but with pfts nearly nl 2/29/15 p rx so was characterized as GOLD 0/ AB  But by 09/2017 met criteria for GOLD II    History of Present Illness  10/20/2017  f/u ov/Wert re:  A B/  still smoking  And now meets criteria for GOLD II maint on dulera/spiriva  Chief Complaint  Patient presents with  . Follow-up    Pt had full PFT prior to OV today. Pt has SOB with exertion, productive cough-yellow, wheezing, some chest tightness. Pt is on 2 liters of O2 con't.  Dyspnea:  Food lion struggle on 3lpm  Cough: better Sleep: cpap/ 30 degrees SABA use:  Needing less  rec No change work on      01/19/2018  f/u ov/Wert re: GOLD II and still smoking/ maint on symb/spiriva/ 02 2-3lpm and using med calendar well  Chief Complaint  Patient presents with  . Follow-up    pt c/o worsening sob worse with warm weather.     Dyspnea:  MMRC3 = can't walk 100 yards even at a slow pace at a flat grade s stopping due to sob even on 3lpm  Cough: some worse since hot weather . 4-5 days  Sleeping: 30 degrees/cpap 3lpm SABA CHY:IFOY daily though hfa suboptimal/ short ti rec For cough add gabapentin 300 mg at lunch and supper    NP eval   04/22/18 acute flare AB  Doxycycline 133m Twice daily  For 1 week , take with food.  Prednisone taper over next week. Call if blood sugar >250 .  Mucinex DM Twice daily  As needed  Cough /congestion  Use oxygen 2l/m  Wear CPAP  At bedtime and with naps. Try to get in at least 4hr .  Work on not smoking . Set a quit date.  Continue on SGreece Rinse after use.  Use Albuterol Neb every 4hrs as needed.  Follow up with PCP or Ortho for left leg injury .  Follow up with with Dr. WMelvyn Novas In 2-3  Months  and As needed   Please contact office for sooner follow up if symptoms do not improve or  worsen or seek emergency care  Get flu shot next week if better.    06/20/2018  f/u ov/Wert re: gold II copd/ still smoking maint rx dulera 200/spiriva Chief Complaint  Patient presents with  . Follow-up    SOB majority of the time x2 weeks, wheezing, cough with thick yellow mucus   Dyspnea:  Gradually Worse x 2 weeks Cough: thick mucus turning greener esp in am  Sleeping: on cpap/ nasal congestion with dry 02  SABA use: baseline couple times a day at most but x last two weeks 4 x daily and neb which helps more 02: 2-3lpm NP rec Augmentin 875 mg take one pill twice daily  X 10 days - take at breakfast and supper with large glass of water.  It would help reduce the usual side effects (diarrhea and yeast infections) if you ate cultured yogurt at lunch.  Prednisone 10 mg take  4 each am x 2 days,   2 each am x 2 days,  1 each am x 2 days and stop      08/01/2018  f/u ov/Wert re: GOLD  II/III spirometry but still smoking/ 02 dep on duler 200/spiriva smi Chief Complaint  Patient presents with  . Follow-up    mildly cong cough (clear to creamy color) but doing much better  Dyspnea:  Shopping better / not checking 02 while ex = /MMRC2 = can't walk a nl pace on a flat grade s sob but does fine slow and flat   Cough: occ / clear mucus  Sleeping:  Off cpap x 3 weeks 30 degrees electric bed  SABA use: rarely and never noct  02: 2lpm daytime/ 3lpm hs    No obvious day to day or daytime variability or assoc  purulent sputum or mucus plugs or hemoptysis or cp or chest tightness, subjective wheeze or overt sinus or hb symptoms.    .Also denies any obvious fluctuation of symptoms with weather or environmental changes or other aggravating or alleviating factors except as outlined above   No unusual exposure hx or h/o childhood pna/ asthma or knowledge of premature birth.  Current Allergies, Complete Past Medical History, Past Surgical History, Family History, and Social History were reviewed in  [employer] record.  ROS  The following are not active complaints unless bolded Hoarseness, sore throat, dysphagia, dental problems, itching, sneezing,  nasal congestion or discharge of excess mucus or purulent secretions, ear ache,   fever, chills, sweats, unintended wt loss or wt gain, classically pleuritic or exertional cp,  orthopnea pnd or arm/hand swelling  or leg swelling, presyncope, palpitations, abdominal pain, anorexia, nausea, vomiting, diarrhea  or change in bowel habits or change in bladder habits, change in stools or change in urine, dysuria, hematuria,  rash, arthralgias, visual complaints, headache, numbness, weakness or ataxia or problems with walking or coordination,  change in mood or  memory.        Current Meds  Medication Sig  . albuterol (PROAIR HFA) 108 (90 Base) MCG/ACT inhaler Inhale 2 puffs into the lungs every 4 (four) hours as needed for wheezing or shortness of breath (((PLAN B))).  Marland Kitchen albuterol (PROVENTIL) (2.5 MG/3ML) 0.083% nebulizer solution INHALE 1 VIAL VIA NEBULIZER EVERY 6 HOURS AS NEEDED FOR WHEEZING/SHORTNESS OF BREATH  . atorvastatin (LIPITOR) 20 MG tablet Take 1 tablet (20 mg total) by mouth daily at 6 PM.  . benzonatate (TESSALON) 200 MG capsule Take 1 capsule (200 mg total) by mouth 3 (three) times daily as needed for cough. Swallow whole, to not bite pill  . cetirizine (ZYRTEC) 10 MG tablet Take 10 mg by mouth daily.   Marland Kitchen dextromethorphan-guaiFENesin (MUCINEX DM) 30-600 MG 12hr tablet Take 1 tablet by mouth 2 (two) times daily as needed for cough.  . escitalopram (LEXAPRO) 20 MG tablet Take 1 tablet (20 mg total) by mouth daily.  . famotidine (PEPCID) 20 MG tablet One at bedtime  . fluticasone (FLONASE) 50 MCG/ACT nasal spray SPRAY 2 SPRAYS INTO EACH NOSTRIL EVERY DAY  . furosemide (LASIX) 20 MG tablet Take 2 tablets (40 mg total) by mouth daily.  Marland Kitchen gabapentin (NEURONTIN) 300 MG capsule Take 1 tablet in the morning and 2 at night   . LORazepam (ATIVAN) 0.5 MG tablet TAKE 1/2 TO 1 TABLET BY MOUTH TWICE A DAY AS NEEDED FOR ANXIETY  . metFORMIN (GLUCOPHAGE) 500 MG tablet TAKE 1 TABLET BY MOUTH EVERYDAY AT BEDTIME  . mometasone-formoterol (DULERA) 200-5 MCG/ACT AERO Inhale 2 puffs into the lungs 2 (two) times daily.  . Multiple Vitamin (MULTIVITAMIN WITH MINERALS) TABS tablet Take 1 tablet by mouth daily.  Marland Kitchen  nystatin (MYCOSTATIN) 100000 UNIT/ML suspension TAKE 1 TEASPOONFUL BY MOUTH 3 TIMES DAILY AS NEEDED FOR MOUTH SORES.  Marland Kitchen OXYGEN 2 lpm with sleep with CPAP and during the day with exertion  . pantoprazole (PROTONIX) 40 MG tablet TAKE 1 TABLET BY MOUTH EVERY DAY 30 TO 60 MINUTES BEFORE THE FIRST MEAL OF THE DAY  . promethazine-dextromethorphan (PROMETHAZINE-DM) 6.25-15 MG/5ML syrup Take 5 mLs by mouth 4 (four) times daily as needed for cough.  Marland Kitchen spironolactone (ALDACTONE) 25 MG tablet Take 1 tablet (25 mg total) by mouth 2 (two) times daily.  . Tiotropium Bromide Monohydrate (SPIRIVA RESPIMAT) 2.5 MCG/ACT AERS Inhale 2 puffs into the lungs daily.  . varenicline (CHANTIX CONTINUING MONTH PAK) 1 MG tablet Take 1 tablet (1 mg total) by mouth 2 (two) times daily.  . varenicline (CHANTIX STARTING MONTH PAK) 0.5 MG X 11 & 1 MG X 42 tablet Take one 0.5 mg tablet by mouth once daily for 3 days, then increase to one 0.5 mg tablet twice daily for 4 days, then increase to one 1 mg tablet twice daily.                    Objective:   Physical Exam  Obese wf nad   10/06/2013         288 > 11/17/2013  288> 07/30/2015  266  > 08/12/2017   284 > 09/16/2017  278 > 01/19/2018   270 > . 06/20/2018   262 > 08/01/2018   252     09/22/13 293 lb 12.8 oz (133.267 kg)  09/18/13 289 lb 4 oz (131.203 kg)  08/21/13 283 lb 8 oz (128.595 kg)      Vital signs reviewed - Note on arrival 02 sats  97% on 2lpm      HEENT: nl dentition / oropharynx. Nl external ear canals without cough reflex -  Mild bilateral non-specific turbinate edema     NECK :   without JVD/Nodes/TM/ nl carotid upstrokes bilaterally   LUNGS: no acc muscle use,  Mild barrel  contour chest wall with bilateral  Distant bs s audible wheeze and  without cough on insp or exp maneuver and mild  Hyperresonant  to  percussion bilaterally     CV:  RRR  no s3 or murmur or increase in P2, and no edema   ABD: quit obese with poor insp excursion   in the supine position. No bruits or organomegaly appreciated, bowel sounds nl  MS:   Nl gait/  ext warm without deformities, calf tenderness, cyanosis or clubbing No obvious joint restrictions   SKIN: warm and dry without lesions    NEURO:  alert, approp, nl sensorium with  no motor or cerebellar deficits apparent.             Assessment & Plan:

## 2018-08-01 NOTE — Patient Instructions (Addendum)
No change in medications  I will check with Dr Evlyn Courier nurse re your cpap  Be sure you keeps your sats above 90% with walking   Please schedule a follow up visit in 6  months but call sooner if needed

## 2018-08-02 ENCOUNTER — Encounter: Payer: Self-pay | Admitting: Internal Medicine

## 2018-08-02 NOTE — Assessment & Plan Note (Addendum)
4-5 min discussion re active cigarette smoking in addition to office E&M  Ask about tobacco use:   Ongoing despite chantix Advise quitting   I took an extended  opportunity with this patient to outline the consequences of continued cigarette use  in airway disorders based on all the data we have from the multiple national lung health studies (perfomed over decades at millions of dollars in cost)  indicating that smoking cessation, not choice of inhalers or physicians, is the most important aspect of care.   Assess willingness:  Hoping chantix will get her over the hump Assist in quit attempt:  Per PCP when ready Arrange follow up:   Follow up per Primary Care planned  For smoking cessation classes call [phone]

## 2018-08-02 NOTE — Assessment & Plan Note (Signed)
Active smoker Last worked Jun 29 2017 seeking full disability   - PFT's  10/20/2017  FEV1 1.16 (50 % ) ratio 58  p 14 % improvement from saba p dulera/spriva prior to study with DLCO  78 % corrects to 94 % for alv volume  erv  15%   - 08/01/2018  After extensive coaching inhaler device,  effectiveness =    75% with short Ti problematic    Group D in terms of symptom/risk and laba/lama/ICS  therefore appropriate rx at this point >>>  Continue dulera/spiriva and work on better inhaler technique and  stopping smoking completely (see separate a/p)

## 2018-08-02 NOTE — Assessment & Plan Note (Signed)
Needs new machine and f/u per Dr Craige Cotta for all cpap issues   I had an extended discussion with the patient reviewing all relevant studies completed to date and  lasting 15 to 20 minutes of a 25 minute visit    See device teaching which extended face to face time for this visit.  Each maintenance medication was reviewed in detail including emphasizing most importantly the difference between maintenance and prns and under what circumstances the prns are to be triggered using an action plan format that is not reflected in the computer generated alphabetically organized AVS which I have not found useful in most complex patients, especially with respiratory illnesses  Please see AVS for specific instructions unique to this visit that I personally wrote and verbalized to the the pt in detail and then reviewed with pt  by my nurse highlighting any  changes in therapy recommended at today's visit to their plan of care.

## 2018-08-02 NOTE — Assessment & Plan Note (Signed)
07/30/2015   Walked RA  2 laps @ 185 ft each stopped due to  Sob with sats still 90% so rec 02 hs only plugged into cpap machine - as of 09/16/2017  02 = 2lpm at rest and 3lpm with activity as may be developing cor pulmonale - 01/19/2018   Walked 2lpm x  2 laps @ 185 ft each stopped due to  Sob with sats ok at moderate pace  Present rx = 2lpm daytime/ 3lpm hs off cpap until machine replaced    Adequate control on present rx, reviewed in detail with pt > no change in recs  needed  > needs to remember to check sats with exertion and titrate to keep above 90% walking "to help burn fat"

## 2018-08-11 ENCOUNTER — Other Ambulatory Visit (INDEPENDENT_AMBULATORY_CARE_PROVIDER_SITE_OTHER): Payer: Medicaid Other

## 2018-08-11 DIAGNOSIS — Z1211 Encounter for screening for malignant neoplasm of colon: Secondary | ICD-10-CM | POA: Diagnosis not present

## 2018-08-11 LAB — FECAL OCCULT BLOOD, IMMUNOCHEMICAL: Fecal Occult Bld: NEGATIVE

## 2018-08-11 LAB — FECAL OCCULT BLOOD, GUAIAC: Fecal Occult Blood: NEGATIVE

## 2018-08-15 ENCOUNTER — Encounter: Payer: Self-pay | Admitting: Family Medicine

## 2018-08-22 ENCOUNTER — Other Ambulatory Visit: Payer: Self-pay | Admitting: Family Medicine

## 2018-08-23 ENCOUNTER — Other Ambulatory Visit: Payer: Self-pay | Admitting: Internal Medicine

## 2018-08-25 ENCOUNTER — Telehealth: Payer: Self-pay | Admitting: Internal Medicine

## 2018-08-25 NOTE — Telephone Encounter (Signed)
Medication name and strength: Spirvia Respimat 2.55mcg Aerosol Provider: [facility] Patient insurance ID: [id number] N Phone: [phone]  Was the PA started on CMM?  [place] today on the phone 08/25/18 If yes, please enter the Key: reference number [id number]; HC#[id number] Timeframe for approval/denial: takes 5-7 business days for determination   Routing message to [facility] for PA f/u and review.

## 2018-09-01 NOTE — Telephone Encounter (Signed)
There is no other aerosol LAMA so try to do with just dulera Take 2 puffs first thing in am and then another 2 puffs about 12 hours later/ albuterol prn and no smoking at all   Ov if doing worse off spiriva.

## 2018-09-01 NOTE — Telephone Encounter (Signed)
Received denial of spiriva resp 2.5  Letter placed in Dr Thurston Hole lookat for review  Unable to give sample at this time due to protocol

## 2018-09-01 NOTE — Telephone Encounter (Signed)
Called pt and advised message from the provider. Pt understood and verbalized understanding. Nothing further is needed.    

## 2018-09-11 ENCOUNTER — Other Ambulatory Visit: Payer: Self-pay | Admitting: Family Medicine

## 2018-09-11 ENCOUNTER — Other Ambulatory Visit: Payer: Self-pay | Admitting: Internal Medicine

## 2018-09-12 ENCOUNTER — Other Ambulatory Visit: Payer: Self-pay | Admitting: Family Medicine

## 2018-09-14 NOTE — Telephone Encounter (Signed)
E-scribed Flonase refill.  Denied Celexa request due to Dr. Reece Agar stopping med and changing to Lexapro. [See OV, 07/27/18.]

## 2018-09-14 NOTE — Telephone Encounter (Signed)
Electronic refill request HCTZ No longer on medication list Lasix is on current medication   See allergy/contraindiction

## 2018-09-19 NOTE — Telephone Encounter (Signed)
Denied.

## 2018-10-05 ENCOUNTER — Other Ambulatory Visit: Payer: Self-pay | Admitting: Internal Medicine

## 2018-10-14 ENCOUNTER — Other Ambulatory Visit: Payer: Self-pay | Admitting: Family Medicine

## 2018-10-14 MED ORDER — LORAZEPAM 0.5 MG PO TABS: ORAL_TABLET | ORAL | 0 refills | Status: DC

## 2018-10-14 NOTE — Telephone Encounter (Signed)
Eprescribed.

## 2018-10-14 NOTE — Telephone Encounter (Signed)
Name of Medication: Lorazepam 0.5 mg Name of Pharmacy[facility] [place] [facility] [place] or Written Date and Quantity: # 30 on 07/29/18 Last Office Visit and Type: 07/27/18 annual Next Office Visit and Type: 11/28/18 for 4 month FU

## 2018-10-21 ENCOUNTER — Other Ambulatory Visit: Payer: Self-pay | Admitting: Family Medicine

## 2018-10-25 NOTE — Telephone Encounter (Signed)
plz call - we've prescribed 3 months of chantix already - how is she doing with this? Shouldn't take longer than 3 months at a time. I've refilled 1 more month but would stop after this.

## 2018-10-25 NOTE — Telephone Encounter (Signed)
Spoke with pt relaying Dr. Timoteo Expose message.  Pt verbalizes understanding.  Says she's down to 1.5 cigarettes daily, so it is helping.

## 2018-11-27 ENCOUNTER — Other Ambulatory Visit: Payer: Self-pay | Admitting: Family Medicine

## 2018-11-28 ENCOUNTER — Ambulatory Visit (INDEPENDENT_AMBULATORY_CARE_PROVIDER_SITE_OTHER)
Admission: RE | Admit: 2018-11-28 | Discharge: 2018-11-28 | Disposition: A | Discharge status: Home / Self Care | Payer: Medicaid Other | Source: Ambulatory Visit | Attending: Family Medicine | Admitting: Family Medicine

## 2018-11-28 ENCOUNTER — Ambulatory Visit (INDEPENDENT_AMBULATORY_CARE_PROVIDER_SITE_OTHER): Payer: Medicaid Other | Admitting: Family Medicine

## 2018-11-28 ENCOUNTER — Other Ambulatory Visit: Payer: Self-pay

## 2018-11-28 ENCOUNTER — Encounter: Payer: Self-pay | Admitting: Family Medicine

## 2018-11-28 VITALS — BP 132/78 | HR 70 | Temp 97.8°F | Ht 60.5 in | Wt 246.5 lb

## 2018-11-28 DIAGNOSIS — Z6841 Body Mass Index (BMI) 40.0 and over, adult: Secondary | ICD-10-CM

## 2018-11-28 DIAGNOSIS — E1142 Type 2 diabetes mellitus with diabetic polyneuropathy: Secondary | ICD-10-CM

## 2018-11-28 DIAGNOSIS — J4530 Mild persistent asthma, uncomplicated: Secondary | ICD-10-CM

## 2018-11-28 DIAGNOSIS — J449 Chronic obstructive pulmonary disease, unspecified: Secondary | ICD-10-CM

## 2018-11-28 DIAGNOSIS — F17210 Nicotine dependence, cigarettes, uncomplicated: Secondary | ICD-10-CM

## 2018-11-28 DIAGNOSIS — F4322 Adjustment disorder with anxiety: Secondary | ICD-10-CM

## 2018-11-28 DIAGNOSIS — R2241 Localized swelling, mass and lump, right lower limb: Secondary | ICD-10-CM | POA: Diagnosis not present

## 2018-11-28 LAB — POCT GLYCOSYLATED HEMOGLOBIN (HGB A1C): Hemoglobin A1C: 6.7 % — AB (ref 4.0–5.6)

## 2018-11-28 MED ORDER — ESCITALOPRAM OXALATE 20 MG PO TABS: 30.0000 mg | ORAL_TABLET | Freq: Every day | ORAL | 3 refills | Status: DC

## 2018-11-28 NOTE — Assessment & Plan Note (Addendum)
Noted over the past several months.  H/o R knee replacement 2008 Sharol Given).  Unclear etiology. Start with plain films, consider return to Dr Sharol Given.

## 2018-11-28 NOTE — Assessment & Plan Note (Signed)
Chronic, stable on low dose metformin - continue. Encouraged she schedule eye exam as due.

## 2018-11-28 NOTE — Progress Notes (Signed)
This visit was conducted in person.  BP 132/78 (BP Location: Left Arm, Patient Position: Sitting, Cuff Size: Large)   Pulse 70   Temp 97.8 F (36.6 C) (Temporal)   Ht 5' 0.5" (1.537 m)   Wt 246 lb 8 oz (111.8 kg)   LMP 03/08/2013   SpO2 98% Comment: 2 L, continuous  BMI 47.35 kg/m    CC: 4 mo DM f/u visit Subjective:    Patient ID: Brenda [name], female    DOB: 06-19-1960, 58 y.o.   MRN: [id number]  HPI: Brenda [name] is a 58 y.o. female presenting on 11/28/2018 for Diabetes (Here for 4 mo f/u.) and Discuss Medication (Wants to discuss Lexapro. )   Oxygen dependent asthmatic bronchitis with COPD (2L [place]) - insurance did not cover spiriva respimat - now only on dulera 2 puffs BID. Also continues PRN albuterol. Followed by pulm Dr Sherene SiresWert.   Smoking - cutting down - down to smoking every few days. tried chantix x4 months with nausea as side effect, just completed course.   Anxiety worsened after husband's passing - on lexapro 20mg  daily and PRN lorazepam.   DM - does regularly check sugars fasting 130. Compliant with antihyperglycemic regimen which includes: metformin 500mg  daily. Denies low sugars or hypoglycemic symptoms. + paresthesias on gabapentin. Last diabetic eye exam OVERDUE. Pneumovax: 2017, 2003. Prevnar: not due. Glucometer brand: relion. DSME: declines. Last visit we started statin, tolerating well without myalgias. She does notice waking up with cramps of shins and calves.  Lab Results  Component Value Date   HGBA1C 6.7 (A) 11/28/2018   Diabetic Foot Exam - Simple   Simple Foot Form Diabetic Foot exam was performed with the following findings: Yes 11/28/2018  9:27 AM  Visual Inspection No deformities, no ulcerations, no other skin breakdown bilaterally: Yes Sensation Testing Intact to touch and monofilament testing bilaterally: Yes Pulse Check Posterior Tibialis and Dorsalis pulse intact bilaterally: Yes Comments    Lab Results  Component Value Date   MICROALBUR  1.7 07/22/2018     Down 25 lbs in the past year! Healthy diet changes endorsed.     Relevant past medical, surgical, family and social history reviewed and updated as indicated. Interim medical history since our last visit reviewed. Allergies and medications reviewed and updated. Outpatient Medications Prior to Visit  Medication Sig Dispense Refill  . albuterol (PROAIR HFA) 108 (90 Base) MCG/ACT inhaler Inhale 2 puffs into the lungs every 4 (four) hours as needed for wheezing or shortness of breath (((PLAN B))).    Marland Kitchen. albuterol (PROVENTIL) (2.5 MG/3ML) 0.083% nebulizer solution INHALE 1 VIAL VIA NEBULIZER EVERY 6 HOURS AS NEEDED FOR WHEEZING/SHORTNESS OF BREATH 150 mL 1  . atorvastatin (LIPITOR) 20 MG tablet Take 1 tablet (20 mg total) by mouth daily at 6 PM. 90 tablet 3  . benzonatate (TESSALON) 200 MG capsule Take 1 capsule (200 mg total) by mouth 3 (three) times daily as needed for cough. Swallow whole, to not bite pill 45 capsule 2  . cetirizine (ZYRTEC) 10 MG tablet Take 10 mg by mouth daily.     Marland Kitchen. dextromethorphan-guaiFENesin (MUCINEX DM) 30-600 MG 12hr tablet Take 1 tablet by mouth 2 (two) times daily as needed for cough.    . DULERA 200-5 MCG/ACT AERO INHALE 2 PUFFS INTO THE LUNGS TWICE A DAY 13 Inhaler 3  . famotidine (PEPCID) 20 MG tablet TAKE 1 TABLET BY MOUTH EVERYDAY AT BEDTIME 90 tablet 3  . fluticasone (FLONASE) 50 MCG/ACT nasal  spray SPRAY 2 SPRAYS INTO EACH NOSTRIL EVERY DAY 48 g 0  . furosemide (LASIX) 20 MG tablet Take 2 tablets (40 mg total) by mouth daily. 180 tablet 1  . gabapentin (NEURONTIN) 300 MG capsule Take 1 tablet in the morning and 2 at night 270 capsule 1  . LORazepam (ATIVAN) 0.5 MG tablet TAKE 1/2 TO 1 TABLET BY MOUTH TWICE A DAY AS NEEDED FOR ANXIETY 30 tablet 0  . metFORMIN (GLUCOPHAGE) 500 MG tablet TAKE 1 TABLET BY MOUTH EVERYDAY AT BEDTIME 30 tablet 2  . Multiple Vitamin (MULTIVITAMIN WITH MINERALS) TABS tablet Take 1 tablet by mouth daily.    Marland Kitchen. nystatin  (MYCOSTATIN) 100000 UNIT/ML suspension TAKE 1 TEASPOONFUL BY MOUTH 3 TIMES DAILY AS NEEDED FOR MOUTH SORES. 150 mL 1  . OXYGEN 2 lpm with sleep with CPAP and during the day with exertion    . pantoprazole (PROTONIX) 40 MG tablet TAKE 1 TABLET BY MOUTH EVERY DAY 30 TO 60 MINUTES BEFORE THE FIRST MEAL OF THE DAY 90 tablet 1  . promethazine-dextromethorphan (PROMETHAZINE-DM) 6.25-15 MG/5ML syrup Take 5 mLs by mouth 4 (four) times daily as needed for cough. 150 mL 0  . spironolactone (ALDACTONE) 25 MG tablet Take 1 tablet (25 mg total) by mouth 2 (two) times daily. 180 tablet 1  . escitalopram (LEXAPRO) 20 MG tablet Take 1 tablet (20 mg total) by mouth daily. 30 tablet 3  . CHANTIX CONTINUING MONTH PAK 1 MG tablet TAKE AS DIRECTED 60 tablet 0  . SPIRIVA RESPIMAT 2.5 MCG/ACT AERS INHALE 2 PUFFS INTO THE LUNGS DAILY 1 Inhaler 6   No facility-administered medications prior to visit.      Per HPI unless specifically indicated in ROS section below Review of Systems Objective:    BP 132/78 (BP Location: Left Arm, Patient Position: Sitting, Cuff Size: Large)   Pulse 70   Temp 97.8 F (36.6 C) (Temporal)   Ht 5' 0.5" (1.537 m)   Wt 246 lb 8 oz (111.8 kg)   LMP 03/08/2013   SpO2 98% Comment: 2 L, continuous  BMI 47.35 kg/m   Wt Readings from Last 3 Encounters:  11/28/18 246 lb 8 oz (111.8 kg)  08/01/18 252 lb (114.3 kg)  07/27/18 251 lb 3 oz (113.9 kg)    Physical Exam Vitals signs and nursing note reviewed.  Constitutional:      General: She is not in acute distress.    Appearance: She is well-developed.  HENT:     Head: Normocephalic and atraumatic.     Right Ear: External ear normal.     Left Ear: External ear normal.     Nose: Nose normal.     Mouth/Throat:     Pharynx: No oropharyngeal exudate.  Eyes:     General: No scleral icterus.    Conjunctiva/sclera: Conjunctivae normal.     Pupils: Pupils are equal, round, and reactive to light.  Neck:     Musculoskeletal: Normal range  of motion and neck supple.  Cardiovascular:     Rate and Rhythm: Normal rate and regular rhythm.     Heart sounds: Normal heart sounds. No murmur.  Pulmonary:     Effort: Pulmonary effort is normal. No respiratory distress.     Breath sounds: Normal breath sounds. No wheezing or rales.  Musculoskeletal: Normal range of motion.     Comments:  See HPI for foot exam if done FROM at R knee without pain.  Firm small irregular mobile mass inferior to medial  knee joint, non tender, without erythema or fluctuance  Lymphadenopathy:     Cervical: No cervical adenopathy.  Skin:    General: Skin is warm and dry.     Findings: No rash.       Results for orders placed or performed in visit on 11/28/18  POCT glycosylated hemoglobin (Hb A1C)  Result Value Ref Range   Hemoglobin A1C 6.7 (A) 4.0 - 5.6 %   HbA1c POC (<> result, manual entry)     HbA1c, POC (prediabetic range)     HbA1c, POC (controlled diabetic range)     [facility] 11/28/2018 07/27/2018 02/24/2017  Decreased Interest 1 1 3   Down, Depressed, Hopeless 2 1 3   PHQ - 2 Score 3 2 6   Altered sleeping 1 3 2   Tired, decreased energy 2 3 3   Change in appetite 0 0 2  Feeling bad or failure about yourself  2 0 0  Trouble concentrating 1 1 2   Moving slowly or fidgety/restless 2 2 0  Suicidal thoughts 0 0 0  PHQ-9 Score 11 11 15     GAD 7 : Generalized Anxiety Score 11/28/2018 07/27/2018  Nervous, Anxious, on Edge 2 2  Control/stop worrying 1 1  Worry too much - different things 1 1  Trouble relaxing 1 2  Restless 2 3  Easily annoyed or irritable 1 1  Afraid - awful might happen 1 1  Total GAD 7 Score 9 11     Assessment & Plan:   Problem List Items Addressed This Visit    Type 2 diabetes, controlled, with peripheral neuropathy (HCC) - Primary    Chronic, stable on low dose metformin - continue. Encouraged she schedule eye exam as due.       Relevant Medications   escitalopram (LEXAPRO) 20 MG tablet   Other  Relevant Orders   POCT glycosylated hemoglobin (Hb A1C) (Completed)   Morbid obesity with BMI of 45.0-49.9, adult (HCC)    Congratulated on ongoing efforts at weight loss, down another 5 lbs in the past 4 months.       Knee mass, right    Noted over the past several months.  H/o R knee replacement 2008 Lajoyce Corners([place]).  Unclear etiology. Start with plain films, consider return to Dr Lajoyce Cornersuda.       Relevant Orders   DG Knee Complete 4 Views Right (Completed)   COPD  GOLD II/III still smoking/ 02 dep    Cigarette smoker    Down to minimal some day smoker - completed 4 months of chantix. Encouraged full cessation.       Asthmatic bronchitis    Appreciate pulm care.       Adjustment disorder with anxiety    Overall similar GAD7/PHQ9. Will increase lexapro to 30mg  daily per pt request.           Meds ordered this encounter  Medications  . escitalopram (LEXAPRO) 20 MG tablet    Sig: Take 1.5 tablets (30 mg total) by mouth daily.    Dispense:  45 tablet    Refill:  3   Orders Placed This Encounter  Procedures  . DG Knee Complete 4 Views Right    Standing Status:   Future    Number of Occurrences:   1    Standing Expiration Date:   01/28/2020    Order Specific Question:   Reason for Exam (SYMPTOM  OR DIAGNOSIS REQUIRED)    Answer:   R knee replacement with superficial medial mass  Order Specific Question:   Is patient pregnant?    Answer:   No    Order Specific Question:   Preferred imaging location?    Answer:   [facility]    Order Specific Question:   Radiology Contrast Protocol - do NOT remove file path    Answer:   [email or web address]  . POCT glycosylated hemoglobin (Hb A1C)    Follow up plan: Return in about 4 months (around 03/30/2019) for follow up visit.  Ria Bush, MD

## 2018-11-28 NOTE — Assessment & Plan Note (Signed)
Congratulated on ongoing efforts at weight loss, down another 5 lbs in the past 4 months.

## 2018-11-28 NOTE — Assessment & Plan Note (Addendum)
Down to minimal some day smoker - completed 4 months of chantix. Encouraged full cessation.

## 2018-11-28 NOTE — Patient Instructions (Addendum)
Increase lexapro to 1.5 tablets daily. You are doing well today. Continue current medicines congrats on weight loss! Return in 22months for follow up visit.  Xray of right knee today.

## 2018-11-28 NOTE — Assessment & Plan Note (Addendum)
Overall similar GAD7/PHQ9. Will increase lexapro to 30mg  daily per pt request.

## 2018-11-28 NOTE — Assessment & Plan Note (Signed)
Appreciate pulm care.  ?

## 2018-12-13 ENCOUNTER — Other Ambulatory Visit: Payer: Self-pay | Admitting: Family Medicine

## 2018-12-21 ENCOUNTER — Other Ambulatory Visit: Payer: Self-pay | Admitting: Family Medicine

## 2018-12-21 NOTE — Telephone Encounter (Signed)
Last refill 10/14/18 #30 x 2QJ Next Appt With Family Medicine Ria Bush, MD) 03/30/2019 at 8:15 AM  Please advise.

## 2018-12-21 NOTE — Telephone Encounter (Signed)
Eprescribed.

## 2019-01-08 ENCOUNTER — Other Ambulatory Visit: Payer: Self-pay | Admitting: Internal Medicine

## 2019-01-19 ENCOUNTER — Other Ambulatory Visit: Payer: Self-pay | Admitting: Family Medicine

## 2019-01-30 ENCOUNTER — Other Ambulatory Visit: Payer: Self-pay | Admitting: Internal Medicine

## 2019-02-03 ENCOUNTER — Ambulatory Visit: Payer: Medicaid Other | Admitting: Internal Medicine

## 2019-02-08 ENCOUNTER — Other Ambulatory Visit: Payer: Self-pay

## 2019-02-08 ENCOUNTER — Encounter: Payer: Self-pay | Admitting: Internal Medicine

## 2019-02-08 ENCOUNTER — Ambulatory Visit (INDEPENDENT_AMBULATORY_CARE_PROVIDER_SITE_OTHER): Payer: Medicaid Other | Admitting: Internal Medicine

## 2019-02-08 DIAGNOSIS — J449 Chronic obstructive pulmonary disease, unspecified: Secondary | ICD-10-CM

## 2019-02-08 DIAGNOSIS — F17210 Nicotine dependence, cigarettes, uncomplicated: Secondary | ICD-10-CM | POA: Diagnosis not present

## 2019-02-08 DIAGNOSIS — Z23 Encounter for immunization: Secondary | ICD-10-CM | POA: Diagnosis not present

## 2019-02-08 DIAGNOSIS — J9611 Chronic respiratory failure with hypoxia: Secondary | ICD-10-CM

## 2019-02-08 MED ORDER — PREDNISONE 10 MG PO TABS: ORAL_TABLET | ORAL | 0 refills | Status: DC

## 2019-02-08 NOTE — Patient Instructions (Addendum)
Goal is to keep 02 saturations above 90% with all activities     Prednisone 10 mg take  4 each am x 2 days,   2 each am x 2 days,  1 each am x 2 days and stop   The key is to stop smoking completely before smoking completely stops you!   Please schedule a follow up visit in 3 months but call sooner if needed  - add discuss starting LDSCT next ov

## 2019-02-08 NOTE — Progress Notes (Signed)
Subjective:   Patient ID: Brenda [name], female    DOB: 1960-08-27    MRN: [id number]   Brief patient profile:  58  yowf  MM/ Active smoker wt  07/2015 = 250 and admitted with" aecopd" but with pfts nearly nl 2/29/15 p rx so was characterized as GOLD 0/ AB  But by 09/2017 met criteria for GOLD II    History of Present Illness  10/20/2017  f/u ov/Brenda [name] re:  A B/  still smoking  And now meets criteria for GOLD II maint on dulera/spiriva  Chief Complaint  Patient presents with  . Follow-up    Pt had full PFT prior to OV today. Pt has SOB with exertion, productive cough-yellow, wheezing, some chest tightness. Pt is on 2 liters of O2 con't.  Dyspnea:  Food lion struggle on 3lpm  Cough: better Sleep: cpap/ 30 degrees SABA use:  Needing less  rec No change  rx     01/19/2018  f/u ov/Brenda [name] re: GOLD II and still smoking/ maint on symb/spiriva/ 02 2-3lpm and using med calendar well  Chief Complaint  Patient presents with  . Follow-up    pt c/o worsening sob worse with warm weather.     Dyspnea:  MMRC3 = can't walk 100 yards even at a slow pace at a flat grade s stopping due to sob even on 3lpm  Cough: some worse since hot weather . 4-5 days  Sleeping: 30 degrees/cpap 3lpm SABA XHB:ZJIR daily though hfa suboptimal/ short ti rec For cough add gabapentin 300 mg at lunch and supper    NP eval   04/22/18 acute flare AB  Doxycycline 133m Twice daily  For 1 week , take with food.  Prednisone taper over next week. Call if blood sugar >250 .  Mucinex DM Twice daily  As needed  Cough /congestion  Use oxygen 2l/m  Wear CPAP  At bedtime and with naps. Try to get in at least 4hr .  Work on not smoking . Set a quit date.  Continue on SGreece Rinse after use.  Use Albuterol Neb every 4hrs as needed.  Follow up with PCP or Ortho for left leg injury .  Follow up with with Dr. WMelvyn Novas In 2-3  Months  and As needed   Please contact office for sooner follow up if symptoms do not improve or  worsen or seek emergency care  Get flu shot next week if better.    06/20/2018  f/u ov/Brenda [name] re: gold II copd/ still smoking maint rx dulera 200/spiriva Chief Complaint  Patient presents with  . Follow-up    SOB majority of the time x2 weeks, wheezing, cough with thick yellow mucus   Dyspnea:  Gradually Worse x 2 weeks Cough: thick mucus turning greener esp in am  Sleeping: on cpap/ nasal congestion with dry 02  SABA use: baseline couple times a day at most but x last two weeks 4 x daily and neb which helps more 02: 2-3lpm NP rec Augmentin 875 mg take one pill twice daily  X 10 days - take at breakfast and supper with large glass of water.  It would help reduce the usual side effects (diarrhea and yeast infections) if you ate cultured yogurt at lunch.  Prednisone 10 mg take  4 each am x 2 days,   2 each am x 2 days,  1 each am x 2 days and stop      08/01/2018  f/u ov/Brenda [name] re: GOLD  II/III spirometry but still smoking/ 02 dep on duler 200/spiriva smi Chief Complaint  Patient presents with  . Follow-up    mildly cong cough (clear to creamy color) but doing much better  Dyspnea:  Shopping better / not checking 02 while ex = /MMRC2 = can't walk a nl pace on a flat grade s sob but does fine slow and flat   Cough: occ / clear mucus  Sleeping:  Off cpap x 3 weeks 30 degrees electric bed  SABA use: rarely and never noct  02: 2lpm daytime/ 3lpm hs  rec No change in medications Be sure you keeps your sats above 90% with walking     02/08/2019  f/u ov/Brenda [name] re: GOLD II /II 02 dep at hs and walking/ maint on dulera 200  Chief Complaint  Patient presents with  . Follow-up    6 month f/u for COPD. States her breathing has been ok since her last visit.   Dyspnea:  MMRC2 = can't walk a nl pace on a flat grade s sob but does fine slow and flat on 2lpm never checks sats as rec  Cough: sporadic / min mucoid esp in am/ still smoking  Sleeping: cpap and 3lpm SABA use:  hfa more often = twice daily ,  neb x 2 last week  02: none at rest up to 2lpm walking    No obvious day to day or daytime variability or assoc excess/ purulent sputum or mucus plugs or hemoptysis or cp or chest tightness, subjective wheeze or overt sinus or hb symptoms.   Sleeping as above  without nocturnal  or early am exacerbation  of respiratory  c/o's or need for noct saba. Also denies any obvious fluctuation of symptoms with weather or environmental changes or other aggravating or alleviating factors except as outlined above   No unusual exposure hx or h/o childhood pna/ asthma or knowledge of premature birth.  Current Allergies, Complete Past Medical History, Past Surgical History, Family History, and Social History were reviewed in [employer] record.  ROS  The following are not active complaints unless bolded Hoarseness, sore throat, dysphagia, dental problems, itching, sneezing,  nasal congestion or discharge of excess mucus or purulent secretions, ear ache,   fever, chills, sweats, unintended wt loss or wt gain, classically pleuritic or exertional cp,  orthopnea pnd or arm/hand swelling  or leg swelling, presyncope, palpitations, abdominal pain, anorexia, nausea, vomiting, diarrhea  or change in bowel habits or change in bladder habits, change in stools or change in urine, dysuria, hematuria,  rash, arthralgias, visual complaints, headache, numbness, weakness or ataxia or problems with walking or coordination,  change in mood or  memory.        Current Meds  Medication Sig  . albuterol (PROAIR HFA) 108 (90 Base) MCG/ACT inhaler Inhale 2 puffs into the lungs every 4 (four) hours as needed for wheezing or shortness of breath (((PLAN B))).  Marland Kitchen albuterol (PROVENTIL) (2.5 MG/3ML) 0.083% nebulizer solution INHALE 1 VIAL VIA NEBULIZER EVERY 6 HOURS AS NEEDED FOR WHEEZING/SHORTNESS OF BREATH  . atorvastatin (LIPITOR) 20 MG tablet Take 1 tablet (20 mg total) by mouth daily at 6 PM.  . benzonatate  (TESSALON) 200 MG capsule Take 1 capsule (200 mg total) by mouth 3 (three) times daily as needed for cough. Swallow whole, to not bite pill  . cetirizine (ZYRTEC) 10 MG tablet Take 10 mg by mouth daily.   Marland Kitchen dextromethorphan-guaiFENesin (MUCINEX DM) 30-600 MG 12hr tablet Take 1  tablet by mouth 2 (two) times daily as needed for cough.  . DULERA 200-5 MCG/ACT AERO INHALE 2 PUFFS INTO THE LUNGS TWICE A DAY  . escitalopram (LEXAPRO) 20 MG tablet Take 1.5 tablets (30 mg total) by mouth daily.  . famotidine (PEPCID) 20 MG tablet TAKE 1 TABLET BY MOUTH EVERYDAY AT BEDTIME  . fluticasone (FLONASE) 50 MCG/ACT nasal spray SPRAY 2 SPRAYS INTO EACH NOSTRIL EVERY DAY  . furosemide (LASIX) 20 MG tablet TAKE 2 TABLETS BY MOUTH EVERY DAY  . gabapentin (NEURONTIN) 300 MG capsule Take 1 tablet in the morning and 2 at night  . LORazepam (ATIVAN) 0.5 MG tablet TAKE 1/2 TO 1 TABLET BY MOUTH TWICE A DAY AS NEEDED FOR ANXIETY  . metFORMIN (GLUCOPHAGE) 500 MG tablet TAKE 1 TABLET BY MOUTH EVERYDAY AT BEDTIME  . Multiple Vitamin (MULTIVITAMIN WITH MINERALS) TABS tablet Take 1 tablet by mouth daily.  Marland Kitchen nystatin (MYCOSTATIN) 100000 UNIT/ML suspension TAKE 1 TEASPOONFUL BY MOUTH 3 TIMES DAILY AS NEEDED FOR MOUTH SORES.  Marland Kitchen OXYGEN 2 lpm with sleep with CPAP and during the day with exertion  . pantoprazole (PROTONIX) 40 MG tablet TAKE 1 TABLET BY MOUTH EVERY DAY 30 TO 60 MINUTES BEFORE THE FIRST MEAL OF THE DAY  . promethazine-dextromethorphan (PROMETHAZINE-DM) 6.25-15 MG/5ML syrup Take 5 mLs by mouth 4 (four) times daily as needed for cough.  Marland Kitchen spironolactone (ALDACTONE) 25 MG tablet TAKE 1 TABLET BY MOUTH TWICE A DAY              Objective:   Physical Exam  0bese wf nad   10/06/2013         288 > 11/17/2013  288> 07/30/2015  266  > 08/12/2017   284 > 09/16/2017  278 > 01/19/2018   270 > . 06/20/2018   262 > 08/01/2018   252 > 02/08/2019     239    09/22/13 293 lb 12.8 oz (133.267 kg)  09/18/13 289 lb 4 oz (131.203 kg)   08/21/13 283 lb 8 oz (128.595 kg)      Vital signs reviewed - Note on arrival 02 sats  96% on RA     HEENT: nl dentition / oropharynx. Nl external ear canals without cough reflex -  Mild bilateral non-specific turbinate edema     NECK :  without JVD/Nodes/TM/ nl carotid upstrokes bilaterally   LUNGS: no acc muscle use,  Mild barrel  contour chest wall with bilateral mid exp rhonchi  and  without cough on insp or exp maneuvers  and mild  Hyperresonant  to  percussion bilaterally     CV:  RRR  no s3 or murmur or increase in P2, and no edema   ABD:  soft and nontender with pos end  insp Hoover's  in the supine position. No bruits or organomegaly appreciated, bowel sounds nl  MS:   Nl gait/  ext warm without deformities, calf tenderness, cyanosis or clubbing No obvious joint restrictions   SKIN: warm and dry without lesions    NEURO:  alert, approp, nl sensorium with  no motor or cerebellar deficits apparent.           Assessment & Plan:

## 2019-02-09 ENCOUNTER — Encounter: Payer: Self-pay | Admitting: Internal Medicine

## 2019-02-09 NOTE — Assessment & Plan Note (Signed)
07/30/2015   Walked RA  2 laps @ 185 ft each stopped due to  Sob with sats still 90% so rec 02 hs only plugged into cpap machine - as of 09/16/2017  02 = 2lpm at rest and 3lpm with activity as may be developing cor pulmonale - 01/19/2018   Walked 2lpm x  2 laps @ 185 ft each stopped due to  Sob with sats ok at moderate pace  >>>> As  Of 02/08/2019  3lpm with cpap, none at rest, 2lpm walking but encouraged to monitor sats with goal > 90% at all levels of exertion

## 2019-02-09 NOTE — Assessment & Plan Note (Addendum)
Active smoker - alpha one AT screen   03/25/2015  MM level 176  Last worked Jun 29 2017 seeking full disability   - PFT's  10/20/2017  FEV1 1.16 (50 % ) ratio 58  p 14 % improvement from saba p dulera/spriva prior to study with DLCO  78 % corrects to 94 % for alv volume  erv  15%  - 02/08/2019  After extensive coaching inhaler device,  effectiveness =    75% (short Ti)    Group D in terms of symptom/risk and laba/lama/ICS  therefore appropriate rx at this point >>>  Good candidate for Breztri next month when becomes available if covered by insurance which does not cover spiriva smi    >>> rx for mild flare AB =  Prednisone 10 mg take  4 each am x 2 days,   2 each am x 2 days,  1 each am x 2 days and stop   Advised:  formulary restrictions will be an ongoing challenge for the forseable future and I would be happy to pick an alternative if the pt will first  provide me a list of them -  pt  will need to return here for training for any new device that is required eg dpi vs hfa vs respimat.    In the meantime we can always provide samples so that the patient never runs out of any needed respiratory medications.

## 2019-02-09 NOTE — Assessment & Plan Note (Addendum)
Counseled re importance of smoking cessation but did not meet time criteria for separate billing    >>>> Needs to enter [facility] program with last CT  09/07/17 but eligible for the next 15 years plus. Discuss at f/u ov    I had an extended discussion with the patient reviewing all relevant studies completed to date and  lasting 15 to 20 minutes of a 25 minute visit    I performed detailed device teaching using a teach back method which extended face to face time for this visit (see above)  Each maintenance medication was reviewed in detail including emphasizing most importantly the difference between maintenance and prns and under what circumstances the prns are to be triggered using an action plan format that is not reflected in the computer generated alphabetically organized AVS which I have not found useful in most complex patients, especially with respiratory illnesses  Please see AVS for specific instructions unique to this visit that I personally wrote and verbalized to the the pt in detail and then reviewed with pt  by my nurse highlighting any  changes in therapy recommended at today's visit to their plan of care.

## 2019-03-13 ENCOUNTER — Other Ambulatory Visit: Payer: Self-pay | Admitting: Family Medicine

## 2019-03-13 NOTE — Telephone Encounter (Signed)
Last office visit 11/25/2018 for DM.  Last refilled 12/21/2018 for #30 with no refills.  Next Appt: 03/30/2019 for 4 month follow up on DM.

## 2019-03-13 NOTE — Telephone Encounter (Signed)
ERx 

## 2019-03-14 ENCOUNTER — Other Ambulatory Visit: Payer: Self-pay | Admitting: Family Medicine

## 2019-03-14 NOTE — Telephone Encounter (Signed)
Dr Darnell Level, does this request need to go to pulmonologist?

## 2019-03-20 ENCOUNTER — Telehealth: Payer: Self-pay | Admitting: *Deleted

## 2019-03-20 MED ORDER — GABAPENTIN 300 MG PO CAPS: ORAL_CAPSULE | ORAL | 1 refills | Status: DC

## 2019-03-20 NOTE — Telephone Encounter (Signed)
Patient left a voicemail stating that she needs a new script sent in for Gabapentin. Patient stated that she has been taking two in the morning and three at night. Patient stated that her pain is worse at night. Pharmacy [facility]

## 2019-03-20 NOTE — Telephone Encounter (Signed)
Refilled

## 2019-03-27 ENCOUNTER — Other Ambulatory Visit: Payer: Self-pay | Admitting: Family Medicine

## 2019-03-30 ENCOUNTER — Ambulatory Visit: Payer: Medicaid Other | Admitting: Family Medicine

## 2019-04-04 ENCOUNTER — Ambulatory Visit (INDEPENDENT_AMBULATORY_CARE_PROVIDER_SITE_OTHER): Payer: Medicaid Other | Admitting: Family Medicine

## 2019-04-04 ENCOUNTER — Other Ambulatory Visit: Payer: Self-pay

## 2019-04-04 ENCOUNTER — Encounter: Payer: Self-pay | Admitting: Family Medicine

## 2019-04-04 VITALS — BP 118/68 | HR 78 | Temp 98.0°F | Ht 60.05 in | Wt 232.4 lb

## 2019-04-04 DIAGNOSIS — R2241 Localized swelling, mass and lump, right lower limb: Secondary | ICD-10-CM | POA: Diagnosis not present

## 2019-04-04 DIAGNOSIS — E1142 Type 2 diabetes mellitus with diabetic polyneuropathy: Secondary | ICD-10-CM | POA: Diagnosis not present

## 2019-04-04 DIAGNOSIS — F17210 Nicotine dependence, cigarettes, uncomplicated: Secondary | ICD-10-CM

## 2019-04-04 LAB — POCT GLYCOSYLATED HEMOGLOBIN (HGB A1C): Hemoglobin A1C: 6.3 % — AB (ref 4.0–5.6)

## 2019-04-04 NOTE — Assessment & Plan Note (Addendum)
Chronic, stable. Continue current regimen.  I asked her to schedule diabetic eye exam.

## 2019-04-04 NOTE — Assessment & Plan Note (Signed)
Continue to encourage full cessation 

## 2019-04-04 NOTE — Patient Instructions (Addendum)
Schedule eye exam as you/re due.  You are doing well today  Continue working on smoking cessation Return as needed or in 4 months for physical.

## 2019-04-04 NOTE — Progress Notes (Signed)
This visit was conducted in person.  BP 118/68 (BP Location: Left Arm, Patient Position: Sitting, Cuff Size: Normal)   Pulse 78   Temp 98 F (36.7 C) (Temporal)   Ht 5' 0.05" (1.525 m)   Wt 232 lb 6.4 oz (105.4 kg)   LMP 03/08/2013   SpO2 96% Comment: 2L cont O2 via [place]  BMI 45.31 kg/m    CC: DM f/u visit Subjective:    Patient ID: Brenda [name], female    DOB: 10/02/1960, 58 y.o.   MRN: [id number]  HPI: Brenda [name] is a 58 y.o. female presenting on 04/04/2019 for Diabetes (A1c check today. Pt has no new concerns. )   Smoking - 2 a day. Continues working on quitting. She completed chantix course.   DM - does regularly check sugars 130 or less. Compliant with antihyperglycemic regimen which includes: metformin 500mg  daily. Denies low sugars or hypoglycemic symptoms. ++ hand > foot paresthesias on gabapentin. Last diabetic eye exam DUE. Pneumovax: 2017, 2003. Prevnar: not due. Glucometer brand: relion. DSME: declines. Lab Results  Component Value Date   HGBA1C 6.3 (A) 04/04/2019   Diabetic Foot Exam - Simple   No data filed     Lab Results  Component Value Date   MICROALBUR 1.7 07/22/2018       Relevant past medical, surgical, family and social history reviewed and updated as indicated. Interim medical history since our last visit reviewed. Allergies and medications reviewed and updated. Outpatient Medications Prior to Visit  Medication Sig Dispense Refill  . albuterol (PROVENTIL) (2.5 MG/3ML) 0.083% nebulizer solution INHALE 1 VIAL VIA NEBULIZER EVERY 6 HOURS AS NEEDED FOR WHEEZING/SHORTNESS OF BREATH 150 mL 1  . atorvastatin (LIPITOR) 20 MG tablet Take 1 tablet (20 mg total) by mouth daily at 6 PM. 90 tablet 3  . DULERA 200-5 MCG/ACT AERO INHALE 2 PUFFS INTO THE LUNGS TWICE A DAY 13 g 5  . escitalopram (LEXAPRO) 20 MG tablet TAKE 1 AND 1/2 TABLETS DAILY BY MOUTH 45 tablet 0  . famotidine (PEPCID) 20 MG tablet TAKE 1 TABLET BY MOUTH EVERYDAY AT BEDTIME 90 tablet 3  .  fluticasone (FLONASE) 50 MCG/ACT nasal spray SPRAY 2 SPRAYS INTO EACH NOSTRIL EVERY DAY 48 mL 1  . furosemide (LASIX) 20 MG tablet TAKE 2 TABLETS BY MOUTH EVERY DAY 60 tablet 5  . gabapentin (NEURONTIN) 300 MG capsule Take 2 tablets in the morning and 3 at night PO 450 capsule 1  . LORazepam (ATIVAN) 0.5 MG tablet TAKE 1/2 TO 1 TABLET BY MOUTH TWICE A DAY AS NEEDED FOR ANXIETY 30 tablet 0  . metFORMIN (GLUCOPHAGE) 500 MG tablet TAKE 1 TABLET BY MOUTH EVERYDAY AT BEDTIME 90 tablet 2  . Multiple Vitamin (MULTIVITAMIN WITH MINERALS) TABS tablet Take 1 tablet by mouth daily.    07/24/2018 nystatin (MYCOSTATIN) 100000 UNIT/ML suspension TAKE 1 TEASPOONFUL BY MOUTH 3 TIMES DAILY AS NEEDED FOR MOUTH SORES. 150 mL 1  . OXYGEN 2 lpm with sleep with CPAP and during the day with exertion    . pantoprazole (PROTONIX) 40 MG tablet TAKE 1 TABLET BY MOUTH EVERY DAY 30 TO 60 MINUTES BEFORE THE FIRST MEAL OF THE DAY 90 tablet 1  . PROAIR RESPICLICK 108 (90 Base) MCG/ACT AEPB INHALE 2 PUFFS INTO THE LUNGS EVERY 6 HOURS AS NEEDED FOR DYSPNEA/WHEEZE 2 each 3  . spironolactone (ALDACTONE) 25 MG tablet TAKE 1 TABLET BY MOUTH TWICE A DAY 60 tablet 5  . benzonatate (TESSALON) 200 MG  capsule Take 1 capsule (200 mg total) by mouth 3 (three) times daily as needed for cough. Swallow whole, to not bite pill (Patient not taking: Reported on 04/04/2019) 45 capsule 2  . dextromethorphan-guaiFENesin (MUCINEX DM) 30-600 MG 12hr tablet Take 1 tablet by mouth 2 (two) times daily as needed for cough.    . promethazine-dextromethorphan (PROMETHAZINE-DM) 6.25-15 MG/5ML syrup Take 5 mLs by mouth 4 (four) times daily as needed for cough. (Patient not taking: Reported on 04/04/2019) 150 mL 0  . cetirizine (ZYRTEC) 10 MG tablet Take 10 mg by mouth daily.     . predniSONE (DELTASONE) 10 MG tablet Take  4 each am x 2 days,   2 each am x 2 days,  1 each am x 2 days and stop (Patient not taking: Reported on 04/04/2019) 14 tablet 0   No facility-administered  medications prior to visit.      Per HPI unless specifically indicated in ROS section below Review of Systems Objective:    BP 118/68 (BP Location: Left Arm, Patient Position: Sitting, Cuff Size: Normal)   Pulse 78   Temp 98 F (36.7 C) (Temporal)   Ht 5' 0.05" (1.525 m)   Wt 232 lb 6.4 oz (105.4 kg)   LMP 03/08/2013   SpO2 96% Comment: 2L cont O2 via [place]  BMI 45.31 kg/m   Wt Readings from Last 3 Encounters:  04/04/19 232 lb 6.4 oz (105.4 kg)  02/08/19 239 lb 12.8 oz (108.8 kg)  11/28/18 246 lb 8 oz (111.8 kg)    Physical Exam Vitals signs and nursing note reviewed.  Constitutional:      General: She is not in acute distress.    Appearance: Normal appearance. She is well-developed. She is obese. She is not ill-appearing.  HENT:     Head: Normocephalic and atraumatic.     Right Ear: External ear normal.     Left Ear: External ear normal.     Nose: Nose normal.     Mouth/Throat:     Pharynx: No oropharyngeal exudate.  Eyes:     General: No scleral icterus.    Conjunctiva/sclera: Conjunctivae normal.     Pupils: Pupils are equal, round, and reactive to light.  Neck:     Musculoskeletal: Normal range of motion and neck supple.  Cardiovascular:     Rate and Rhythm: Normal rate and regular rhythm.     Heart sounds: Normal heart sounds. No murmur.  Pulmonary:     Effort: Pulmonary effort is normal. No respiratory distress.     Breath sounds: Normal breath sounds. No wheezing or rales.  Musculoskeletal:     Comments: See HPI for foot exam if done  Lymphadenopathy:     Cervical: No cervical adenopathy.  Skin:    General: Skin is warm and dry.     Findings: No rash.  Neurological:     Mental Status: She is alert.       Results for orders placed or performed in visit on 04/04/19  HgB A1c  Result Value Ref Range   Hemoglobin A1C 6.3 (A) 4.0 - 5.6 %   HbA1c POC (<> result, manual entry)     HbA1c, POC (prediabetic range)     HbA1c, POC (controlled diabetic range)      DG Knee Complete 4 Views Right CLINICAL DATA:  Right knee replacement with superficial medial mass  EXAM: RIGHT KNEE - COMPLETE 4+ VIEW  COMPARISON:  03/22/2008  FINDINGS: Sequela of right redo total knee replacement  without definite evidence of hardware failure or loosening.  Moderate to severe degenerative change of the contralateral left knee is suspected, most severely affecting the medial compartment with near complete joint space loss, subchondral sclerosis and osteophytosis. There is minimal spurring the tibial spines. No evidence of chondrocalcinosis.  Presumably old fracture involving the proximal metaphysis of the left fibula, incompletely evaluated.  Ill-defined dystrophic ossicles measuring approximately 3.5 x 4.6 x 2.5 cm are again noted about the medial aspect of the right knee joint space, similar to remote right knee radiographs performed 03/2008. Regional soft tissues appear otherwise normal.  IMPRESSION: 1. Post redo right total knee replacement without evidence of hardware failure or loosening. 2. Dystrophic ossicles about the medial aspect of the right knee joint space, similar to the 2009 examination, though could correlate with provided history of medial-sided superficial mass. Clinical correlation is advised. 3. Presumably old fracture involving the proximal aspect of the contralateral left fibula, incompletely evaluated. 4. Moderate to severe degenerative change involving the medial compartment of the contralateral left knee incompletely evaluated.  Electronically Signed   By: Sandi Mariscal M.D.   On: 11/28/2018 09:10    Assessment & Plan:   Problem List Items Addressed This Visit    Type 2 diabetes, controlled, with peripheral neuropathy ([place]) - Primary    Chronic, stable. Continue current regimen.  I asked her to schedule diabetic eye exam.       Relevant Orders   HgB A1c (Completed)   Knee mass, right    Has not seen Dr Sharol Given -  encouraged schedule appt.       Cigarette smoker    Continue to encourage full cessation.           No orders of the defined types were placed in this encounter.  Orders Placed This Encounter  Procedures  . HgB A1c    Patient Instructions  Schedule eye exam as you/re due.  You are doing well today  Continue working on smoking cessation Return as needed or in 4 months for physical.   Follow up plan: Return in about 4 months (around 08/02/2019) for annual exam, prior fasting for blood work.  Ria Bush, MD

## 2019-04-04 NOTE — Assessment & Plan Note (Signed)
Has not seen Dr Sharol Given - encouraged schedule appt.

## 2019-04-05 ENCOUNTER — Other Ambulatory Visit: Payer: Self-pay | Admitting: Family Medicine

## 2019-04-05 NOTE — Telephone Encounter (Signed)
ERx 

## 2019-05-03 ENCOUNTER — Emergency Department (HOSPITAL_COMMUNITY)
Admission: EM | Admit: 2019-05-03 | Discharge: 2019-05-03 | Disposition: A | Discharge status: Home / Self Care | Payer: Medicaid Other | Attending: Emergency Medicine | Admitting: Emergency Medicine

## 2019-05-03 ENCOUNTER — Emergency Department (HOSPITAL_COMMUNITY): Payer: Medicaid Other

## 2019-05-03 ENCOUNTER — Encounter (HOSPITAL_COMMUNITY): Payer: Self-pay | Admitting: Emergency Medicine

## 2019-05-03 ENCOUNTER — Other Ambulatory Visit: Payer: Self-pay

## 2019-05-03 DIAGNOSIS — Z7984 Long term (current) use of oral hypoglycemic drugs: Secondary | ICD-10-CM | POA: Diagnosis not present

## 2019-05-03 DIAGNOSIS — Z79899 Other long term (current) drug therapy: Secondary | ICD-10-CM | POA: Insufficient documentation

## 2019-05-03 DIAGNOSIS — E1140 Type 2 diabetes mellitus with diabetic neuropathy, unspecified: Secondary | ICD-10-CM | POA: Insufficient documentation

## 2019-05-03 DIAGNOSIS — J449 Chronic obstructive pulmonary disease, unspecified: Secondary | ICD-10-CM | POA: Insufficient documentation

## 2019-05-03 DIAGNOSIS — F17210 Nicotine dependence, cigarettes, uncomplicated: Secondary | ICD-10-CM | POA: Diagnosis not present

## 2019-05-03 DIAGNOSIS — M25562 Pain in left knee: Secondary | ICD-10-CM | POA: Diagnosis present

## 2019-05-03 MED ORDER — DICLOFENAC SODIUM 1 % EX GEL: 4.0000 g | Freq: Four times a day (QID) | CUTANEOUS | 0 refills | Status: DC

## 2019-05-03 MED ORDER — KETOROLAC TROMETHAMINE 60 MG/2ML IM SOLN
15.0000 mg | Freq: Once | INTRAMUSCULAR | Status: AC
  Administered 2019-05-03: 15 mg via INTRAMUSCULAR
  Filled 2019-05-03: qty 2

## 2019-05-03 MED ORDER — OXYCODONE HCL 5 MG PO TABS
5.0000 mg | ORAL_TABLET | Freq: Once | ORAL | Status: AC
  Administered 2019-05-03: 5 mg via ORAL
  Filled 2019-05-03: qty 1

## 2019-05-03 NOTE — ED Provider Notes (Signed)
[facility]-EMERGENCY DEPT Provider Note   CSN: [id number] Arrival date & time: 05/03/19  1555     History   Chief Complaint Chief Complaint  Patient presents with  . Fall  . Knee Pain    HPI Brenda [name] is a 59 y.o. female.   37  58 yo F with a chief complaint of left knee pain.  The patient states that she had a nonsyncopal fall where she injured her left knee.  Hurts mostly along the medial aspect.  States that she slipped in some water and her right foot went out laterally and her left knee buckled laterally.  She denies new pain to her back.  Pain hurts in her knee and goes down the leg.  Has been able to ambulate though with pain.  Usually worsens throughout the day.  The history is provided by the patient.  Fall This is a new problem. The current episode started more than 2 days ago. The problem occurs constantly. The problem has been gradually worsening. Pertinent negatives include no chest pain, no abdominal pain, no headaches and no shortness of breath. The symptoms are aggravated by bending and twisting. Nothing relieves the symptoms. She has tried nothing for the symptoms. The treatment provided no relief.  Knee Pain Associated symptoms: no fever     Past Medical History:  Diagnosis Date  . Acute pancreatitis   . Asthma    main dyspnea thought due to deconditioning (10/2013)  . Bipolar I disorder, most recent episode (or current) manic, unspecified    pt denies this  . Bronchitis, chronic obstructive (HCC) 2008 & 2009   FeV1 64% TLC 105% DLCO 55% 2008  -  FeV1 81% FeF 25-75 43% 2009  . COPD (chronic obstructive pulmonary disease) (HCC)    emphysema, not an issue per pulm (10/2013)  . History of pyelonephritis 05/2012  . HTN (hypertension)   . Hx of migraines   . Knee pain, right   . Leukocytosis, unspecified   . Lumbar disc disease with radiculopathy    multilevel spondylitic changes, scoliosis, and anterolisthesis L4 not thought to  currently be surg candidate (Dr. Phoebe Perch, [facility]) (Dr. Ollen Bowl, [place])  . Nicotine addiction   . Nonspecific abnormal electrocardiogram (ECG) (EKG)   . Obesity   . Pure hyperglyceridemia   . Suicide attempt (HCC) 06/2001  . Swelling of limb   . Urge and stress incontinence 07/2012   ([place])    Patient Active Problem List   Diagnosis Date Noted  . Knee mass, right 11/28/2018  . Advanced care planning/counseling discussion 07/27/2018  . Fall 04/22/2018  . Unilateral primary osteoarthritis, left knee 10/28/2017  . COPD  GOLD II/III still smoking/ 02 dep  10/20/2017  . Cor pulmonale (chronic) (HCC) 09/16/2017  . Diastolic dysfunction 08/26/2017  . Edema 08/04/2017  . Grief 02/24/2017  . Chronic respiratory failure with hypoxia (HCC) 07/31/2015  . Type 2 diabetes, controlled, with peripheral neuropathy (HCC)   . OSA on CPAP 09/14/2014  . Health maintenance examination 09/14/2014  . Upper airway cough syndrome 09/22/2013  . Chronic pain syndrome 05/30/2013  . Migraine, unspecified, without mention of intractable migraine without mention of status migrainosus 05/30/2013  . Pyelonephritis 05/05/2012  . Asthmatic bronchitis 02/27/2008  . Leukocytosis 02/22/2008  . NONSPECIFIC ABNORMAL ELECTROCARDIOGRAM 02/22/2008  . HYPERTENSION, BENIGN ESSENTIAL 06/23/2007  . HYPERTRIGLYCERIDEMIA 06/01/2007  . Morbid obesity with BMI of 45.0-49.9, adult (HCC) 06/01/2007  . Adjustment disorder with anxiety 06/01/2007  . Cigarette smoker  06/01/2007    Past Surgical History:  Procedure Laterality Date  . CHOLECYSTECTOMY  07/1982  . CT MAXILLOFACIAL WO/W CM  06/14/06   Left max mucocele  . CT of chest  06/14/2006   Normal except c/w active inflammation / infection.  Left renal atrophy  . ERCP / sphincterotomy - stenosis  01/15/06  . lumbar MRI  11/2010   multilevel spondylitic changes with upper lumbar scoliosis and anterolisthesis of L4 on 5 with some L foraminal narrowing esp at L/3 with  concavity of scoliosis, some central stenosis and biforaminal narrowing at L4/5 with spondylolisthesis  . [place] - Pneumonia, acute asthma, left maxillary sinusitis  01/11 - 06/18/2006  . Retained stone     ERCP secondary to retained stone  . Right knee surgery  1984,1989,1989,1991,1994   Reconstructions for ACL insuff  . TUBAL LIGATION       OB History   No obstetric history on file.      Home Medications    Prior to Admission medications   Medication Sig Start Date End Date Taking? Authorizing Provider  albuterol (PROVENTIL) (2.5 MG/3ML) 0.083% nebulizer solution INHALE 1 VIAL VIA NEBULIZER EVERY 6 HOURS AS NEEDED FOR WHEEZING/SHORTNESS OF BREATH 05/11/18   Ria Bush, MD  atorvastatin (LIPITOR) 20 MG tablet Take 1 tablet (20 mg total) by mouth daily at 6 PM. 07/27/18   Ria Bush, MD  benzonatate (TESSALON) 200 MG capsule Take 1 capsule (200 mg total) by mouth 3 (three) times daily as needed for cough. Swallow whole, to not bite pill Patient not taking: Reported on 04/04/2019 04/22/18   Parrett, Fonnie Mu, NP  dextromethorphan-guaiFENesin (MUCINEX DM) 30-600 MG 12hr tablet Take 1 tablet by mouth 2 (two) times daily as needed for cough.    [provider]  diclofenac Sodium (VOLTAREN) 1 % GEL Apply 4 g topically 4 (four) times daily. 05/03/19   Deno Etienne, DO  DULERA 200-5 MCG/ACT AERO INHALE 2 PUFFS INTO THE LUNGS TWICE A DAY 01/30/19   Tanda Rockers, MD  escitalopram (LEXAPRO) 20 MG tablet TAKE 1 AND 1/2 TABLETS DAILY BY MOUTH 03/27/19   Ria Bush, MD  famotidine (PEPCID) 20 MG tablet TAKE 1 TABLET BY MOUTH EVERYDAY AT BEDTIME 09/12/18   Tanda Rockers, MD  fluticasone [facility]) 50 MCG/ACT nasal spray SPRAY 2 SPRAYS INTO EACH NOSTRIL EVERY DAY 03/13/19   Ria Bush, MD  furosemide (LASIX) 20 MG tablet TAKE 2 TABLETS BY MOUTH EVERY DAY 01/09/19   Tanda Rockers, MD  gabapentin (NEURONTIN) 300 MG capsule Take 2 tablets in the morning and 3 at  night PO 03/20/19   Ria Bush, MD  LORazepam (ATIVAN) 0.5 MG tablet TAKE 1/2 TO 1 TABLET BY MOUTH TWICE A DAY AS NEEDED FOR ANXIETY 04/05/19   Ria Bush, MD  metFORMIN (GLUCOPHAGE) 500 MG tablet TAKE 1 TABLET BY MOUTH EVERYDAY AT BEDTIME 01/20/19   Ria Bush, MD  Multiple Vitamin (MULTIVITAMIN WITH MINERALS) TABS tablet Take 1 tablet by mouth daily.    [provider]  nystatin (MYCOSTATIN) 100000 UNIT/ML suspension TAKE 1 TEASPOONFUL BY MOUTH 3 TIMES DAILY AS NEEDED FOR MOUTH SORES. 04/22/18   Parrett, Tammy S, NP  OXYGEN 2 lpm with sleep with CPAP and during the day with exertion    [provider]  pantoprazole (PROTONIX) 40 MG tablet TAKE 1 TABLET BY MOUTH EVERY DAY 30 TO 60 MINUTES BEFORE THE FIRST MEAL OF THE DAY 06/10/18   Tanda Rockers, MD  [facility]  RESPICLICK 108 (90 Base) MCG/ACT AEPB INHALE 2 PUFFS INTO THE LUNGS EVERY 6 HOURS AS NEEDED FOR DYSPNEA/WHEEZE 03/14/19   Eustaquio BoydenGutierrez, Javier, MD  promethazine-dextromethorphan (PROMETHAZINE-DM) 6.25-15 MG/5ML syrup Take 5 mLs by mouth 4 (four) times daily as needed for cough. Patient not taking: Reported on 04/04/2019 04/22/18   Parrett, Virgel Bouquetammy S, NP  spironolactone (ALDACTONE) 25 MG tablet TAKE 1 TABLET BY MOUTH TWICE A DAY 01/09/19   Nyoka CowdenWert, Michael B, MD    Family History Family History  Problem Relation Age of Onset  . Heart disease Brother        MI in early 4440's  . Hypertension Mother   . Hyperlipidemia Mother   . Hypertension Father   . Asthma Father   . Breast cancer Paternal Grandmother     Social History Social History   Tobacco Use  . Smoking status: Current Some Day Smoker    Years: 41.00    Types: Cigarettes  . Smokeless tobacco: Never Used  . Tobacco comment: 5 cigs per day 08/01/2018 began chantix  Substance Use Topics  . Alcohol use: No    Alcohol/week: 0.0 standard drinks  . Drug use: No     Allergies   Metolazone   Review of Systems Review of Systems  Constitutional:  Negative for chills and fever.  HENT: Negative for congestion and rhinorrhea.   Eyes: Negative for redness and visual disturbance.  Respiratory: Negative for shortness of breath and wheezing.   Cardiovascular: Negative for chest pain and palpitations.  Gastrointestinal: Negative for abdominal pain, nausea and vomiting.  Genitourinary: Negative for dysuria and urgency.  Musculoskeletal: Positive for arthralgias. Negative for myalgias.  Skin: Negative for pallor and wound.  Neurological: Negative for dizziness and headaches.     Physical Exam Updated Vital Signs BP 136/80   Pulse 81   Temp 99.3 F (37.4 C) (Oral)   Resp 19   LMP 03/08/2013   SpO2 97%   Physical Exam Vitals signs and nursing note reviewed.  Constitutional:      General: She is not in acute distress.    Appearance: She is well-developed. She is obese. She is not diaphoretic.  HENT:     Head: Normocephalic and atraumatic.  Eyes:     Pupils: Pupils are equal, round, and reactive to light.  Neck:     Musculoskeletal: Normal range of motion and neck supple.  Cardiovascular:     Rate and Rhythm: Normal rate and regular rhythm.     Heart sounds: No murmur. No friction rub. No gallop.   Pulmonary:     Effort: Pulmonary effort is normal.     Breath sounds: No wheezing or rales.  Abdominal:     General: There is no distension.     Palpations: Abdomen is soft.     Tenderness: There is no abdominal tenderness.  Musculoskeletal:        General: Tenderness present.     Comments: Abrasion to the medial aspect of the left knee.  Difficult to assess ligaments secondary to pain though there is no appreciable ligamentous laxity.  Pulse motor and sensation are intact distally.  Patient is unwilling to let me move her through range of motion to do McMurray's test.  Skin:    General: Skin is warm and dry.  Neurological:     Mental Status: She is alert and oriented to person, place, and time.  Psychiatric:        Behavior:  Behavior normal.  ED Treatments / Results  Labs (all labs ordered are listed, but only abnormal results are displayed) Labs Reviewed - No data to display  EKG None  Radiology Dg Knee Complete 4 Views Left  Result Date: 05/03/2019 CLINICAL DATA:  Fall, pain EXAM: LEFT KNEE - COMPLETE 4+ VIEW COMPARISON:  2019 FINDINGS: Alignment is anatomic. No acute fracture. There is remodeling of the proximal diaphysis of the fibula reflecting interval fracture. There are increased changes of osteoarthritis in all compartments with greatest involvement of the medial compartment. No significant joint effusion. IMPRESSION: No acute fracture. Increased tricompartmental osteoarthritis. Interval healed fracture of the proximal fibula. Electronically Signed   By: Guadlupe Spanish M.D.   On: 05/03/2019 16:38    Procedures Procedures (including critical care time)  Medications Ordered in ED Medications  ketorolac (TORADOL) injection 15 mg (has no administration in time range)  oxyCODONE (Oxy IR/ROXICODONE) immediate release tablet 5 mg (has no administration in time range)     Initial Impression / Assessment and Plan / ED Course  I have reviewed the triage vital signs and the nursing notes.  Pertinent labs & imaging results that were available during my care of the patient were reviewed by me and considered in my medical decision making (see chart for details).        58 yo F with a chief complaints of left knee pain.  This occurred after a nonsyncopal fall.  There is no significant edema.  She has too much pain to fully assess the ligaments.  Plain film viewed by me without fracture.  Patient has a knee immobilizer at home.  Walks with a walker.  Have her follow-up with her family doctor in about a week.  Tylenol and Voltaren gel.  5:23 PM:  I have discussed the diagnosis/risks/treatment options with the patient and family and believe the pt to be eligible for discharge home to follow-up with  PCP. We also discussed returning to the ED immediately if new or worsening sx occur. We discussed the sx which are most concerning (e.g., sudden worsening pain, fever, inability to tolerate by mouth) that necessitate immediate return. Medications administered to the patient during their visit and any new prescriptions provided to the patient are listed below.  Medications given during this visit Medications  ketorolac (TORADOL) injection 15 mg (has no administration in time range)  oxyCODONE (Oxy IR/ROXICODONE) immediate release tablet 5 mg (has no administration in time range)     The patient appears reasonably screen and/or stabilized for discharge and I doubt any other medical condition or other [facility] requiring further screening, evaluation, or treatment in the ED at this time prior to discharge.    Final Clinical Impressions(s) / ED Diagnoses   Final diagnoses:  Acute pain of left knee    ED Discharge Orders         Ordered    diclofenac Sodium (VOLTAREN) 1 % GEL  4 times daily     05/03/19 1720           Melene Plan, DO 05/03/19 1723

## 2019-05-03 NOTE — Discharge Instructions (Signed)
Take tylenol 1000mg (2 extra strength) four times a day.   Use the gel as prescribed.  Follow up with your doctor in about a week and have them reevaluate your knee.

## 2019-05-03 NOTE — ED Triage Notes (Signed)
Reports that Thursday she was in flip flops and slipped on her mother's porch landing on left knee. Having pains that are getting worse.

## 2019-05-03 NOTE — ED Notes (Signed)
An After Visit Summary was printed and given to the patient. Discharge instructions given and no further questions at this time. Pt leaving with daughter, pt leaving using home O2 [place] at 2L which is her baseline.

## 2019-05-06 ENCOUNTER — Other Ambulatory Visit: Payer: Self-pay | Admitting: Family Medicine

## 2019-05-12 ENCOUNTER — Ambulatory Visit: Payer: Medicaid Other | Admitting: Internal Medicine

## 2019-05-18 ENCOUNTER — Other Ambulatory Visit: Payer: Self-pay | Admitting: Family Medicine

## 2019-05-18 NOTE — Telephone Encounter (Signed)
Name of Medication: Lorazepam Name of Pharmacy: [facility] or Written Date and Quantity: 04/05/19, #30 Last Office Visit and Type: 04/04/19, f/u Next Office Visit and Type: 08/07/19, CPE Last Controlled Substance Agreement Date: none Last UDS: none

## 2019-05-18 NOTE — Telephone Encounter (Signed)
ERx 

## 2019-05-19 ENCOUNTER — Ambulatory Visit: Payer: Medicaid Other | Admitting: Internal Medicine

## 2019-05-30 ENCOUNTER — Other Ambulatory Visit: Payer: Self-pay

## 2019-05-30 ENCOUNTER — Ambulatory Visit (INDEPENDENT_AMBULATORY_CARE_PROVIDER_SITE_OTHER): Payer: Medicaid Other | Admitting: Internal Medicine

## 2019-05-30 ENCOUNTER — Ambulatory Visit (HOSPITAL_COMMUNITY)
Admission: RE | Admit: 2019-05-30 | Discharge: 2019-05-30 | Disposition: A | Discharge status: Home / Self Care | Payer: Medicaid Other | Source: Ambulatory Visit | Attending: Internal Medicine | Admitting: Internal Medicine

## 2019-05-30 ENCOUNTER — Telehealth: Payer: Self-pay | Admitting: Internal Medicine

## 2019-05-30 ENCOUNTER — Ambulatory Visit (INDEPENDENT_AMBULATORY_CARE_PROVIDER_SITE_OTHER): Payer: Medicaid Other

## 2019-05-30 ENCOUNTER — Encounter: Payer: Self-pay | Admitting: Internal Medicine

## 2019-05-30 DIAGNOSIS — J449 Chronic obstructive pulmonary disease, unspecified: Secondary | ICD-10-CM

## 2019-05-30 DIAGNOSIS — J9611 Chronic respiratory failure with hypoxia: Secondary | ICD-10-CM

## 2019-05-30 DIAGNOSIS — M7989 Other specified soft tissue disorders: Secondary | ICD-10-CM

## 2019-05-30 DIAGNOSIS — F17210 Nicotine dependence, cigarettes, uncomplicated: Secondary | ICD-10-CM | POA: Diagnosis not present

## 2019-05-30 MED ORDER — FLUCONAZOLE 100 MG PO TABS: 100.0000 mg | ORAL_TABLET | Freq: Every day | ORAL | 0 refills | Status: DC

## 2019-05-30 MED ORDER — BREZTRI AEROSPHERE 160-9-4.8 MCG/ACT IN AERO: 2.0000 | INHALATION_SPRAY | Freq: Two times a day (BID) | RESPIRATORY_TRACT | 0 refills | Status: DC

## 2019-05-30 MED ORDER — FLUTICASONE PROPIONATE 50 MCG/ACT NA SUSP: 1.0000 | Freq: Two times a day (BID) | NASAL | 11 refills | Status: DC

## 2019-05-30 MED ORDER — AMOXICILLIN-POT CLAVULANATE 875-125 MG PO TABS: 1.0000 | ORAL_TABLET | Freq: Two times a day (BID) | ORAL | 0 refills | Status: AC

## 2019-05-30 MED ORDER — PREDNISONE 10 MG PO TABS: ORAL_TABLET | ORAL | 0 refills | Status: DC

## 2019-05-30 MED ORDER — BREZTRI AEROSPHERE 160-9-4.8 MCG/ACT IN AERO: 2.0000 | INHALATION_SPRAY | Freq: Two times a day (BID) | RESPIRATORY_TRACT | 11 refills | Status: DC

## 2019-05-30 NOTE — Progress Notes (Signed)
LMTCB

## 2019-05-30 NOTE — Assessment & Plan Note (Addendum)
Active smoker - alpha one AT screen   03/25/2015  MM level 176  Last worked Jun 29 2017 seeking full disability   - PFT's  10/20/2017  FEV1 1.16 (50 % ) ratio 58  p 14 % improvement from saba p dulera/spriva prior to study with DLCO  78 % corrects to 94 % for alv volume  erv  15%  - 05/30/2019  After extensive coaching inhaler device,  effectiveness =    90% > try bevespi 2 bid    Group D in terms of symptom/risk and laba/lama/ICS  therefore appropriate rx at this point >>>  Try breztri  Advised:  formulary restrictions will be an ongoing challenge for the forseable future and I would be happy to pick an alternative if the pt will first  provide me a list of them -  pt  will need to return here for training for any new device that is required eg dpi vs hfa vs respimat.    In the meantime we can always provide samples so that the patient never runs out of any needed respiratory medications.   Mild flare in setting of rhinitis/ sinusitis > rx augmentin/ pred x 6 days / diflucan at end of rx for recurrent yeast

## 2019-05-30 NOTE — Progress Notes (Signed)
Msg already left for the pt to call back

## 2019-05-30 NOTE — Assessment & Plan Note (Addendum)
Counseled re importance of smoking cessation but did not meet time criteria for separate billing    Also discussed enrolling in [facility] for lung cancer prevention p covid pandemic   Pt informed of the seriousness of COVID 19 infection as a direct risk to lung health  and safey and to close contacts and should continue to wear a facemask in public and minimize exposure to public locations but especially avoid any area or activity where non-close contacts are not observing distancing or wearing an appropriate face mask.  I strongly recommended vaccine when offered.     >>> f/u in  6  m to avoid covid 19 exp over winter   I had an extended discussion with the patient reviewing all relevant studies completed to date and  lasting 15 to 20 minutes of a 25 minute visit    I performed detailed device teaching using a teach back method which extended face to face time for this visit (see above)  Each maintenance medication was reviewed in detail including emphasizing most importantly the difference between maintenance and prns and under what circumstances the prns are to be triggered using an action plan format that is not reflected in the computer generated alphabetically organized AVS which I have not found useful in most complex patients, especially with respiratory illnesses  Please see AVS for specific instructions unique to this visit that I personally wrote and verbalized to the the pt in detail and then reviewed with pt  by my nurse highlighting any  changes in therapy recommended at today's visit to their plan of care.

## 2019-05-30 NOTE — Patient Instructions (Addendum)
Augmentin 875 mg take one pill twice daily  X 10 days - take at breakfast and supper with large glass of water.  It would help reduce the usual side effects (diarrhea and yeast infections) if you ate cultured yogurt at lunch.   Diflucan 100 mg daily x 3 days at end of augmentin  Prednisone 10 mg take  4 each am x 2 days,   2 each am x 2 days,  1 each am x 2 days and stop   Try breztri samples  to see what if any difference you note and if covered change to it instead of dulera   I emphasized that nasal steroids (flonase) have no immediate benefit in terms of improving symptoms.  To help them reached the target tissue, the patient should use Afrin two puffs every 12 hours applied one min before using the nasal steroids.  Afrin should be stopped after no more than 5 days.  If the symptoms worsen, Afrin can be restarted after 5 days off of therapy to prevent rebound congestion from overuse of Afrin.  I also emphasized that in no way are nasal steroids a concern in terms of "addiction".   Please remember to go to the  x-ray department  for your tests - we will call you with the results when they are available      Add venous dopplers asap   >>> f/u in 6 months - consider restarting LDSCT then (post pandemic)

## 2019-05-30 NOTE — Telephone Encounter (Signed)
Pt calling back ok to leave detailed message with her results [phone]

## 2019-05-30 NOTE — Telephone Encounter (Signed)
Spoke with Barnett Applebaum with vascular [phone]) and was advised pt does not have a DVT nor bakers cyst in left leg. However, there was space/fluid noted under pts left knee cap. Barnett Applebaum requesting this information be passed to Dr. Melvyn Novas.   Will forward to Dr. Melvyn Novas as Juluis Rainier.

## 2019-05-30 NOTE — Progress Notes (Signed)
Subjective:   Patient ID: Brenda [name], female    DOB: 06/14/1960    MRN: [id number]   Brief patient profile:  58  yowf  MM/ Active smoker wt  07/2015 = 250 and admitted with" aecopd" but with pfts nearly nl 2/29/15 p rx so was characterized as GOLD 0/ AB  But by 09/2017 met criteria for GOLD II    History of Present Illness  10/20/2017  f/u ov/Brenda [name] re:  A B/  still smoking  And now meets criteria for GOLD II maint on dulera/spiriva  Chief Complaint  Patient presents with  . Follow-up    Pt had full PFT prior to OV today. Pt has SOB with exertion, productive cough-yellow, wheezing, some chest tightness. Pt is on 2 liters of O2 con't.  Dyspnea:  Food lion struggle on 3lpm  Cough: better Sleep: cpap/ 30 degrees SABA use:  Needing less  rec No change  rx     01/19/2018  f/u ov/Brenda [name] re: GOLD II and still smoking/ maint on symb/spiriva/ 02 2-3lpm and using med calendar well  Chief Complaint  Patient presents with  . Follow-up    pt c/o worsening sob worse with warm weather.     Dyspnea:  MMRC3 = can't walk 100 yards even at a slow pace at a flat grade s stopping due to sob even on 3lpm  Cough: some worse since hot weather . 4-5 days  Sleeping: 30 degrees/cpap 3lpm SABA HRC:BULA daily though hfa suboptimal/ short ti rec For cough add gabapentin 300 mg at lunch and supper    NP eval   04/22/18 acute flare AB  Doxycycline 159m Twice daily  For 1 week , take with food.  Prednisone taper over next week. Call if blood sugar >250 .  Mucinex DM Twice daily  As needed  Cough /congestion  Use oxygen 2l/m  Wear CPAP  At bedtime and with naps. Try to get in at least 4hr .  Work on not smoking . Set a quit date.  Continue on SGreece Rinse after use.  Use Albuterol Neb every 4hrs as needed.  Follow up with PCP or Ortho for left leg injury .  Follow up with with Dr. WMelvyn Novas In 2-3  Months  and As needed   Please contact office for sooner follow up if symptoms do not improve or  worsen or seek emergency care  Get flu shot next week if better.    06/20/2018  f/u ov/Brenda [name] re: gold II copd/ still smoking maint rx dulera 200/spiriva Chief Complaint  Patient presents with  . Follow-up    SOB majority of the time x2 weeks, wheezing, cough with thick yellow mucus   Dyspnea:  Gradually Worse x 2 weeks Cough: thick mucus turning greener esp in am  Sleeping: on cpap/ nasal congestion with dry 02  SABA use: baseline couple times a day at most but x last two weeks 4 x daily and neb which helps more 02: 2-3lpm NP rec Augmentin 875 mg take one pill twice daily  X 10 days - take at breakfast and supper with large glass of water.  It would help reduce the usual side effects (diarrhea and yeast infections) if you ate cultured yogurt at lunch.  Prednisone 10 mg take  4 each am x 2 days,   2 each am x 2 days,  1 each am x 2 days and stop      08/01/2018  f/u ov/Brenda [name] re: GOLD  II/III spirometry but still smoking/ 02 dep on duler 200/spiriva smi Chief Complaint  Patient presents with  . Follow-up    mildly cong cough (clear to creamy color) but doing much better  Dyspnea:  Shopping better / not checking 02 while ex = /MMRC2 = can't walk a nl pace on a flat grade s sob but does fine slow and flat   Cough: occ / clear mucus  Sleeping:  Off cpap x 3 weeks 30 degrees electric bed  SABA use: rarely and never noct  02: 2lpm daytime/ 3lpm hs  rec No change in medications Be sure you keeps your sats above 90% with walking     02/08/2019  f/u ov/Brenda [name] re: GOLD II /II 02 dep at hs and walking/ maint on dulera 200  Chief Complaint  Patient presents with  . Follow-up    6 month f/u for COPD. States her breathing has been ok since her last visit.   Dyspnea:  MMRC2 = can't walk a nl pace on a flat grade s sob but does fine slow and flat on 2lpm never checks sats as rec  Cough: sporadic / min mucoid esp in am/ still smoking  Sleeping: cpap and 3lpm SABA use:  hfa more often = twice daily ,  neb x 2 last week  02: none at rest up to 2lpm walking  rec Goal is to keep 02 saturations above 90% with all activities   Prednisone 10 mg take  4 each am x 2 days,   2 each am x 2 days,  1 each am x 2 days and stop  The key is to stop smoking completely before smoking completely stops you!        05/30/2019  f/u ov/Brenda [name] re:  COPD II/III spirometry but 02 dep maint  on dulera 200 2bid/ still smoking   Chief Complaint  Patient presents with  . Follow-up    increased nasal and chest congestion x 2 wks. She is coughing up some clear to dark yellow sputum. She is having some wheezing and chest tightness-using her proair and neb both several times per day.   Dyspnea: no change = MMRC2 = can't walk a nl pace on a flat grade s sob but does fine slow and flat does one step at time / also limited by knees esp L x 4 weeks with swelling LLE  Cough: acutely worse x 2 weeks some dark mucus assoc with nasal congestion Sleeping: difficult on cpap 45 degrees hob and 2lpm   SABA use: helps for an hour 02: 2 lpm hs, 3lpm cont when walking/ at rest sits RA low 90s   No obvious day to day or daytime variability or assoc excess/ purulent sputum or mucus plugs or hemoptysis or cp or     or overt  hb symptoms.     Also denies any obvious fluctuation of symptoms with weather or environmental changes or other aggravating or alleviating factors except as outlined above   No unusual exposure hx or h/o childhood pna/ asthma or knowledge of premature birth.  Current Allergies, Complete Past Medical History, Past Surgical History, Family History, and Social History were reviewed in [employer] record.  ROS  The following are not active complaints unless bolded Hoarseness, sore throat, dysphagia, dental problems, itching, sneezing,  nasal congestion or discharge of excess mucus or purulent secretions, ear ache,   fever, chills, sweats, unintended wt loss/intentional or wt gain, classically  pleuritic or exertional cp,  orthopnea pnd or arm/hand swelling  or leg swelling just on L , presyncope, palpitations, abdominal pain, anorexia, nausea, vomiting, diarrhea  or change in bowel habits or change in bladder habits, change in stools or change in urine, dysuria, hematuria,  rash, arthralgias, visual complaints, headache, numbness, weakness or ataxia or problems with walking or coordination,  change in mood or  memory.        Current Meds  Medication Sig  . albuterol (PROVENTIL) (2.5 MG/3ML) 0.083% nebulizer solution INHALE 1 VIAL VIA NEBULIZER EVERY 6 HOURS AS NEEDED FOR WHEEZING/SHORTNESS OF BREATH  . atorvastatin (LIPITOR) 20 MG tablet Take 1 tablet (20 mg total) by mouth daily at 6 PM.  . benzonatate (TESSALON) 200 MG capsule Take 1 capsule (200 mg total) by mouth 3 (three) times daily as needed for cough. Swallow whole, to not bite pill  . dextromethorphan-guaiFENesin (MUCINEX DM) 30-600 MG 12hr tablet Take 1 tablet by mouth 2 (two) times daily as needed for cough.  . diclofenac Sodium (VOLTAREN) 1 % GEL Apply 4 g topically 4 (four) times daily.  . DULERA 200-5 MCG/ACT AERO INHALE 2 PUFFS INTO THE LUNGS TWICE A DAY  . escitalopram (LEXAPRO) 20 MG tablet TAKE 1 AND 1/2 TABLETS DAILY BY MOUTH  . famotidine (PEPCID) 20 MG tablet TAKE 1 TABLET BY MOUTH EVERYDAY AT BEDTIME  . fluticasone (FLONASE) 50 MCG/ACT nasal spray SPRAY 2 SPRAYS INTO EACH NOSTRIL EVERY DAY  . furosemide (LASIX) 20 MG tablet TAKE 2 TABLETS BY MOUTH EVERY DAY  . gabapentin (NEURONTIN) 300 MG capsule Take 2 tablets in the morning and 3 at night PO  . LORazepam (ATIVAN) 0.5 MG tablet TAKE 1/2 TO 1 TABLET BY MOUTH TWICE A DAY AS NEEDED FOR ANXIETY  . metFORMIN (GLUCOPHAGE) 500 MG tablet TAKE 1 TABLET BY MOUTH EVERYDAY AT BEDTIME  . Multiple Vitamin (MULTIVITAMIN WITH MINERALS) TABS tablet Take 1 tablet by mouth daily.  Marland Kitchen nystatin (MYCOSTATIN) 100000 UNIT/ML suspension TAKE 1 TEASPOONFUL BY MOUTH 3 TIMES DAILY AS NEEDED  FOR MOUTH SORES.  Marland Kitchen OXYGEN 2 lpm with sleep with CPAP and during the day with exertion  . pantoprazole (PROTONIX) 40 MG tablet TAKE 1 TABLET BY MOUTH EVERY DAY 30 TO 60 MINUTES BEFORE THE FIRST MEAL OF THE DAY  . PROAIR RESPICLICK 710 (90 Base) MCG/ACT AEPB INHALE 2 PUFFS INTO THE LUNGS EVERY 6 HOURS AS NEEDED FOR DYSPNEA/WHEEZE  . promethazine-dextromethorphan (PROMETHAZINE-DM) 6.25-15 MG/5ML syrup Take 5 mLs by mouth 4 (four) times daily as needed for cough.  Marland Kitchen spironolactone (ALDACTONE) 25 MG tablet TAKE 1 TABLET BY MOUTH TWICE A DAY                 Objective:   Physical Exam   obese amb wf nad   05/30/2019    225  10/06/2013         288 > 11/17/2013  288> 07/30/2015  266  > 08/12/2017   284 > 09/16/2017  278 > 01/19/2018   270 > . 06/20/2018   262 > 08/01/2018   252 > 02/08/2019     239    09/22/13 293 lb 12.8 oz (133.267 kg)  09/18/13 289 lb 4 oz (131.203 kg)  08/21/13 283 lb 8 oz (128.595 kg)     Vital signs reviewed - Note on arrival 02 sats  98% on 3lpm cont       HEENT : pt wearing mask not removed for exam due to covid - 19 concerns.  NECK :  without JVD/Nodes/TM/ nl carotid upstrokes bilaterally   LUNGS: no acc muscle use,  Mild barrel  contour chest wall with bilateral  Distant bs s audible wheeze and  without cough on insp or exp maneuvers  and mild  Hyperresonant  to  percussion bilaterally     CV:  RRR  no s3 or murmur or increase in P2, and 1+ pitting on L none on R   ABD: Obese soft and nontender with pos end  insp Hoover's  in the supine position. No bruits or organomegaly appreciated, bowel sounds nl  MS:   Nl gait/  ext warm without deformities, calf tenderness, cyanosis or clubbing No obvious joint restrictions   SKIN: warm and dry without lesions    NEURO:  alert, approp, nl sensorium with  no motor or cerebellar deficits apparent.                CXR PA and Lateral:   05/30/2019 :    I personally reviewed images and agree with radiology  impression as follows:   Mild bronchitic changes.       Assessment & Plan:

## 2019-05-30 NOTE — Assessment & Plan Note (Addendum)
07/30/2015   Walked RA  2 laps @ 185 ft each stopped due to  Sob with sats still 90% so rec 02 hs only plugged into cpap machine - as of 09/16/2017  02 = 2lpm at rest and 3lpm with activity as may be developing cor pulmonale - 01/19/2018   Walked 2lpm x  2 laps @ 185 ft each stopped due to  Sob with sats ok at moderate pace  As  Of 05/30/2019  3lpm with cpap, none at rest, 2lpm walking but encouraged to monitor sats with goal > 90% at all levels of exertion  Adequate control on present rx, reviewed in detail with pt > no change in rx needed

## 2019-05-30 NOTE — Assessment & Plan Note (Addendum)
New onset early Dec 2020 - Venous dopplers   05/30/2019  Neg dvt/ pos for fluid around patella tendon > referred to ortho

## 2019-05-30 NOTE — Telephone Encounter (Signed)
Call patient : in addition to "no clot" she does does have fluid under her knee cap/ tendon and needs to see her ortho as soon as she can as this would likely indicate significant inflammation and likely the cause of her pain and swelling  Spoke with patient. She verbalized understanding of results. Nothing further needed at time of call.

## 2019-05-30 NOTE — Telephone Encounter (Signed)
See result note.  

## 2019-05-31 ENCOUNTER — Encounter: Payer: Self-pay | Admitting: Internal Medicine

## 2019-06-06 ENCOUNTER — Telehealth: Payer: Self-pay

## 2019-06-06 NOTE — Telephone Encounter (Signed)
PA request received from: [facility] Drug requested: Breztri CMM Key: [facility] Patient has Medicaid, so had to call [employer] to initiate PA.  This has been approved from today through 05/31/2020.   PA#: [id number]. Pharmacy has been made aware.  Nothing further needed at this time- will close encounter.

## 2019-06-19 ENCOUNTER — Other Ambulatory Visit: Payer: Self-pay | Admitting: Family Medicine

## 2019-06-19 NOTE — Telephone Encounter (Signed)
ERx 

## 2019-06-19 NOTE — Telephone Encounter (Signed)
Name of Medication: Lorazepam Name of Pharmacy: [facility] or Written Date and Quantity: 05/18/19, #30 Last Office Visit and Type: 04/04/19, DM f/u Next Office Visit and Type: 08/07/19, CPE Last Controlled Substance Agreement Date: none Last UDS:  none

## 2019-06-28 ENCOUNTER — Other Ambulatory Visit: Payer: Self-pay | Admitting: Internal Medicine

## 2019-07-30 ENCOUNTER — Other Ambulatory Visit: Payer: Self-pay | Admitting: Family Medicine

## 2019-07-30 DIAGNOSIS — D72829 Elevated white blood cell count, unspecified: Secondary | ICD-10-CM

## 2019-07-30 DIAGNOSIS — E1142 Type 2 diabetes mellitus with diabetic polyneuropathy: Secondary | ICD-10-CM

## 2019-07-30 DIAGNOSIS — E781 Pure hyperglyceridemia: Secondary | ICD-10-CM

## 2019-07-31 ENCOUNTER — Other Ambulatory Visit: Payer: Self-pay

## 2019-07-31 ENCOUNTER — Other Ambulatory Visit (INDEPENDENT_AMBULATORY_CARE_PROVIDER_SITE_OTHER): Payer: Medicaid Other

## 2019-07-31 DIAGNOSIS — E1142 Type 2 diabetes mellitus with diabetic polyneuropathy: Secondary | ICD-10-CM | POA: Diagnosis not present

## 2019-07-31 DIAGNOSIS — D72829 Elevated white blood cell count, unspecified: Secondary | ICD-10-CM

## 2019-07-31 DIAGNOSIS — E781 Pure hyperglyceridemia: Secondary | ICD-10-CM

## 2019-07-31 LAB — COMPREHENSIVE METABOLIC PANEL
ALT: 17 U/L (ref 0–35)
AST: 16 U/L (ref 0–37)
Albumin: 4.1 g/dL (ref 3.5–5.2)
Alkaline Phosphatase: 106 U/L (ref 39–117)
BUN: 20 mg/dL (ref 6–23)
CO2: 29 mEq/L (ref 19–32)
Calcium: 9.6 mg/dL (ref 8.4–10.5)
Chloride: 101 mEq/L (ref 96–112)
Creatinine, Ser: 1.16 mg/dL (ref 0.40–1.20)
GFR: 47.49 mL/min — ABNORMAL LOW (ref >60.00)
Glucose, Bld: 107 mg/dL — ABNORMAL HIGH (ref 70–99)
Potassium: 4.8 mEq/L (ref 3.5–5.1)
Sodium: 139 mEq/L (ref 135–145)
Total Bilirubin: 0.4 mg/dL (ref 0.2–1.2)
Total Protein: 7.1 g/dL (ref 6.0–8.3)

## 2019-07-31 LAB — HEMOGLOBIN A1C: Hgb A1c MFr Bld: 6.4 % (ref 4.6–6.5)

## 2019-07-31 LAB — MICROALBUMIN / CREATININE URINE RATIO
Creatinine,U: 38.6 mg/dL
Microalb Creat Ratio: 1.8 mg/g (ref 0.0–30.0)
Microalb, Ur: <0.7 mg/dL (ref 0.0–1.9)

## 2019-07-31 LAB — LIPID PANEL
Cholesterol: 100 mg/dL (ref 0–200)
HDL: 33.2 mg/dL — ABNORMAL LOW (ref >39.00)
LDL Cholesterol: 46 mg/dL (ref 0–99)
NonHDL: 66.85
Total CHOL/HDL Ratio: 3
Triglycerides: 102 mg/dL (ref 0.0–149.0)
VLDL: 20.4 mg/dL (ref 0.0–40.0)

## 2019-07-31 NOTE — Addendum Note (Signed)
Addended by: Alvina Chou on: 07/31/2019 07:59 AM   Modules accepted: Orders

## 2019-08-01 LAB — CBC WITH DIFFERENTIAL/PLATELET
Absolute Monocytes: 850 cells/uL (ref 200–950)
Basophils Absolute: 86 cells/uL (ref 0–200)
Basophils Relative: 0.6 %
Eosinophils Absolute: 518 cells/uL — ABNORMAL HIGH (ref 15–500)
Eosinophils Relative: 3.6 %
HCT: 39.5 % (ref 35.0–45.0)
Hemoglobin: 13.2 g/dL (ref 11.7–15.5)
Lymphs Abs: 3226 cells/uL (ref 850–3900)
MCH: 30.4 pg (ref 27.0–33.0)
MCHC: 33.4 g/dL (ref 32.0–36.0)
MCV: 91 fL (ref 80.0–100.0)
MPV: 10 fL (ref 7.5–12.5)
Monocytes Relative: 5.9 %
Neutro Abs: 9720 cells/uL — ABNORMAL HIGH (ref 1500–7800)
Neutrophils Relative %: 67.5 %
Platelets: 314 10*3/uL (ref 140–400)
RBC: 4.34 10*6/uL (ref 3.80–5.10)
RDW: 13.1 % (ref 11.0–15.0)
Total Lymphocyte: 22.4 %
WBC: 14.4 10*3/uL — ABNORMAL HIGH (ref 3.8–10.8)

## 2019-08-01 LAB — PATHOLOGIST SMEAR REVIEW

## 2019-08-04 ENCOUNTER — Other Ambulatory Visit: Payer: Medicaid Other

## 2019-08-07 ENCOUNTER — Ambulatory Visit (INDEPENDENT_AMBULATORY_CARE_PROVIDER_SITE_OTHER): Payer: Medicaid Other | Admitting: Family Medicine

## 2019-08-07 ENCOUNTER — Encounter: Payer: Self-pay | Admitting: Family Medicine

## 2019-08-07 ENCOUNTER — Other Ambulatory Visit (HOSPITAL_COMMUNITY)
Admission: RE | Admit: 2019-08-07 | Discharge: 2019-08-07 | Disposition: A | Discharge status: Home / Self Care | Payer: Medicaid Other | Source: Ambulatory Visit | Attending: Family Medicine | Admitting: Family Medicine

## 2019-08-07 ENCOUNTER — Other Ambulatory Visit: Payer: Self-pay

## 2019-08-07 VITALS — BP 124/68 | HR 88 | Temp 97.8°F | Ht 60.0 in | Wt 224.3 lb

## 2019-08-07 DIAGNOSIS — N889 Noninflammatory disorder of cervix uteri, unspecified: Secondary | ICD-10-CM

## 2019-08-07 DIAGNOSIS — Z6841 Body Mass Index (BMI) 40.0 and over, adult: Secondary | ICD-10-CM

## 2019-08-07 DIAGNOSIS — I10 Essential (primary) hypertension: Secondary | ICD-10-CM

## 2019-08-07 DIAGNOSIS — E781 Pure hyperglyceridemia: Secondary | ICD-10-CM

## 2019-08-07 DIAGNOSIS — Z7189 Other specified counseling: Secondary | ICD-10-CM

## 2019-08-07 DIAGNOSIS — E1142 Type 2 diabetes mellitus with diabetic polyneuropathy: Secondary | ICD-10-CM

## 2019-08-07 DIAGNOSIS — Z1211 Encounter for screening for malignant neoplasm of colon: Secondary | ICD-10-CM | POA: Diagnosis not present

## 2019-08-07 DIAGNOSIS — Z0000 Encounter for general adult medical examination without abnormal findings: Secondary | ICD-10-CM | POA: Diagnosis not present

## 2019-08-07 DIAGNOSIS — J4530 Mild persistent asthma, uncomplicated: Secondary | ICD-10-CM

## 2019-08-07 DIAGNOSIS — J9611 Chronic respiratory failure with hypoxia: Secondary | ICD-10-CM

## 2019-08-07 DIAGNOSIS — D72829 Elevated white blood cell count, unspecified: Secondary | ICD-10-CM

## 2019-08-07 DIAGNOSIS — Z01419 Encounter for gynecological examination (general) (routine) without abnormal findings: Secondary | ICD-10-CM | POA: Diagnosis not present

## 2019-08-07 DIAGNOSIS — F4322 Adjustment disorder with anxiety: Secondary | ICD-10-CM

## 2019-08-07 DIAGNOSIS — J449 Chronic obstructive pulmonary disease, unspecified: Secondary | ICD-10-CM

## 2019-08-07 DIAGNOSIS — F17210 Nicotine dependence, cigarettes, uncomplicated: Secondary | ICD-10-CM

## 2019-08-07 DIAGNOSIS — N1831 Chronic kidney disease, stage 3a: Secondary | ICD-10-CM

## 2019-08-07 NOTE — Assessment & Plan Note (Signed)
Chronic, stable. Continue spironolactone and lasix. Anticipate improved control with weight loss.

## 2019-08-07 NOTE — Patient Instructions (Addendum)
Pass by lab to pick up stool kit.  Call to schedule mammogram at your convenience: [facility] [phone] Bring me copy of your living will. Increase water intake for kidneys.  Sugars are doing well.  Return as needed or in 6 months for follow up visit.   Health Maintenance for Postmenopausal Women Menopause is a normal process in which your ability to get pregnant comes to an end. This process happens slowly over many months or years, usually between the ages of 66 and 37. Menopause is complete when you have missed your menstrual periods for 12 months. It is important to talk with your health care provider about some of the most common conditions that affect women after menopause (postmenopausal women). These include heart disease, cancer, and bone loss (osteoporosis). Adopting a healthy lifestyle and getting preventive care can help to promote your health and wellness. The actions you take can also lower your chances of developing some of these common conditions. What should I know about menopause? During menopause, you may get a number of symptoms, such as:  Hot flashes. These can be moderate or severe.  Night sweats.  Decrease in sex drive.  Mood swings.  Headaches.  Tiredness.  Irritability.  Memory problems.  Insomnia. Choosing to treat or not to treat these symptoms is a decision that you make with your health care provider. Do I need hormone replacement therapy?  Hormone replacement therapy is effective in treating symptoms that are caused by menopause, such as hot flashes and night sweats.  Hormone replacement carries certain risks, especially as you become older. If you are thinking about using estrogen or estrogen with progestin, discuss the benefits and risks with your health care provider. What is my risk for heart disease and stroke? The risk of heart disease, heart attack, and stroke increases as you age. One of the causes may be a change in the  body's hormones during menopause. This can affect how your body uses dietary fats, triglycerides, and cholesterol. Heart attack and stroke are medical emergencies. There are many things that you can do to help prevent heart disease and stroke. Watch your blood pressure  High blood pressure causes heart disease and increases the risk of stroke. This is more likely to develop in people who have high blood pressure readings, are of African descent, or are overweight.  Have your blood pressure checked: ? Every 3-5 years if you are 19-67 years of age. ? Every year if you are 71 years old or older. Eat a healthy diet   Eat a diet that includes plenty of vegetables, fruits, low-fat dairy products, and lean protein.  Do not eat a lot of foods that are high in solid fats, added sugars, or sodium. Get regular exercise Get regular exercise. This is one of the most important things you can do for your health. Most adults should:  Try to exercise for at least 150 minutes each week. The exercise should increase your heart rate and make you sweat (moderate-intensity exercise).  Try to do strengthening exercises at least twice each week. Do these in addition to the moderate-intensity exercise.  Spend less time sitting. Even light physical activity can be beneficial. Other tips  Work with your health care provider to achieve or maintain a healthy weight.  Do not use any products that contain nicotine or tobacco, such as cigarettes, e-cigarettes, and chewing tobacco. If you need help quitting, ask your health care provider.  Know your numbers. Ask your  health care provider to check your cholesterol and your blood sugar (glucose). Continue to have your blood tested as directed by your health care provider. Do I need screening for cancer? Depending on your health history and family history, you may need to have cancer screening at different stages of your life. This may include screening for:  Breast  cancer.  Cervical cancer.  Lung cancer.  Colorectal cancer. What is my risk for osteoporosis? After menopause, you may be at increased risk for osteoporosis. Osteoporosis is a condition in which bone destruction happens more quickly than new bone creation. To help prevent osteoporosis or the bone fractures that can happen because of osteoporosis, you may take the following actions:  If you are 11-29 years old, get at least 1,000 mg of calcium and at least 600 mg of vitamin D per day.  If you are older than age 84 but younger than age 81, get at least 1,200 mg of calcium and at least 600 mg of vitamin D per day.  If you are older than age 52, get at least 1,200 mg of calcium and at least 800 mg of vitamin D per day. Smoking and drinking excessive alcohol increase the risk of osteoporosis. Eat foods that are rich in calcium and vitamin D, and do weight-bearing exercises several times each week as directed by your health care provider. How does menopause affect my mental health? Depression may occur at any age, but it is more common as you become older. Common symptoms of depression include:  Low or sad mood.  Changes in sleep patterns.  Changes in appetite or eating patterns.  Feeling an overall lack of motivation or enjoyment of activities that you previously enjoyed.  Frequent crying spells. Talk with your health care provider if you think that you are experiencing depression. General instructions See your health care provider for regular wellness exams and vaccines. This may include:  Scheduling regular health, dental, and eye exams.  Getting and maintaining your vaccines. These include: ? Influenza vaccine. Get this vaccine each year before the flu season begins. ? Pneumonia vaccine. ? Shingles vaccine. ? Tetanus, diphtheria, and pertussis (Tdap) booster vaccine. Your health care provider may also recommend other immunizations. Tell your health care provider if you have ever  been abused or do not feel safe at home. Summary  Menopause is a normal process in which your ability to get pregnant comes to an end.  This condition causes hot flashes, night sweats, decreased interest in sex, mood swings, headaches, or lack of sleep.  Treatment for this condition may include hormone replacement therapy.  Take actions to keep yourself healthy, including exercising regularly, eating a healthy diet, watching your weight, and checking your blood pressure and blood sugar levels.  Get screened for cancer and depression. Make sure that you are up to date with all your vaccines. This information is not intended to replace advice given to you by your health care provider. Make sure you discuss any questions you have with your health care provider. Document Revised: 05/11/2018 Document Reviewed: 05/11/2018 Elsevier Patient Education  2020 [employer].

## 2019-08-07 NOTE — Assessment & Plan Note (Addendum)
Appreciate pulm care.  On chronic oxygen. Handicap placard filled out.

## 2019-08-07 NOTE — Assessment & Plan Note (Signed)
Preventative protocols reviewed and updated unless pt declined. Discussed healthy diet and lifestyle.  

## 2019-08-07 NOTE — Assessment & Plan Note (Signed)
Persistent since last year - encouraged limiting NSAID, increased water intake.

## 2019-08-07 NOTE — Progress Notes (Signed)
This visit was conducted in person.  BP 124/68 (BP Location: Left Arm, Patient Position: Sitting, Cuff Size: Large)   Pulse 88   Temp 97.8 F (36.6 C) (Temporal)   Ht 5' (1.524 m)   Wt 224 lb 5 oz (101.7 kg)   LMP 03/08/2013   SpO2 99% Comment: 2 L, continuous  BMI 43.81 kg/m    CC: CPE  Subjective:    Patient ID: Brenda [name], female    DOB: [birth year], 59 y.o.   MRN: [id number]  HPI: Brenda [name] is a 59 y.o. female presenting on 08/07/2019 for Annual Exam (Pt provided [place] [employer]ac[employer] form to be completed. )   Best friend currently in [facility] with metastatic cancer.   O2 dependent asthmatic bronchitis followed by pulm.   2 cigarettes/day - completed chantix. Continues working towards full cessation. Good social support - daughter and her family lives with her.  Preventative: Colon cancer screening - discussed, normal [id number].  Mammogram - 03/2017 WNL. Due for rpt - # provided  Well woman exam - never abnormal cervical screen, normal 08/2014,09/2015. H/o BTL. Will rpt today.  Flu yearly Pneumovax 2003, 07/2015 Tdap 2018. shingrix - discussed. declines  covid - interested Advanced directive - HCPOA is daughter Janett Billow). Has advanced directive at home. Will bring me copy.  Seat belt use discussed Sunscreen use discussed, no changing moles on skin. 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 Interim medical history since our last visit reviewed. Allergies and medications reviewed and updated. Outpatient Medications Prior to Visit  Medication Sig Dispense Refill  . albuterol (PROVENTIL) (2.5 MG/3ML) 0.083% nebulizer solution INHALE 1 VIAL VIA NEBULIZER  EVERY 6 HOURS AS NEEDED FOR WHEEZING/SHORTNESS OF BREATH 150 mL 1  . atorvastatin (LIPITOR) 20 MG tablet Take 1 tablet (20 mg total) by mouth daily at 6 PM. 90 tablet 3  . benzonatate (TESSALON) 200 MG capsule Take 1 capsule (200 mg total) by mouth 3 (three) times daily as needed for cough. Swallow whole, to not bite pill 45 capsule 2  . Budeson-Glycopyrrol-Formoterol (BREZTRI AEROSPHERE) 160-9-4.8 MCG/ACT AERO Inhale 2 puffs into the lungs 2 (two) times daily. 10.7 g 11  . dextromethorphan-guaiFENesin (MUCINEX DM) 30-600 MG 12hr tablet Take 1 tablet by mouth 2 (two) times daily as needed for cough.    . escitalopram (LEXAPRO) 20 MG tablet TAKE 1 AND 1/2 TABLETS DAILY BY MOUTH 45 tablet 6  . famotidine (PEPCID) 20 MG tablet TAKE 1 TABLET BY MOUTH EVERYDAY AT BEDTIME 90 tablet 3  . fluticasone (FLONASE) 50 MCG/ACT nasal spray Place 1 spray into both nostrils 2 (two) times daily. 16 g 11  . furosemide (LASIX) 20 MG tablet TAKE 2 TABLETS BY MOUTH EVERY DAY 60 tablet 2  . gabapentin (NEURONTIN) 300 MG capsule Take 2 tablets in the morning and 3 at night PO 450 capsule 1  . LORazepam (ATIVAN) 0.5 MG tablet TAKE 1/2 TO 1 TABLET BY MOUTH TWICE A DAY AS NEEDED FOR ANXIETY 30 tablet 0  . metFORMIN (GLUCOPHAGE) 500 MG tablet TAKE 1 TABLET BY MOUTH EVERYDAY AT BEDTIME 90 tablet 2  . Multiple Vitamin (MULTIVITAMIN WITH MINERALS) TABS tablet Take 1 tablet by mouth daily.    Marland Kitchen  nystatin (MYCOSTATIN) 100000 UNIT/ML suspension TAKE 1 TEASPOONFUL BY MOUTH 3 TIMES DAILY AS NEEDED FOR MOUTH SORES. 150 mL 1  . OXYGEN 2 lpm with sleep with CPAP and during the day with exertion    . pantoprazole (PROTONIX) 40 MG tablet TAKE 1 TABLET BY MOUTH EVERY DAY 30 TO 60 MINUTES BEFORE THE FIRST MEAL OF THE DAY 90 tablet 1  . PROAIR RESPICLICK 578 (90 Base) MCG/ACT AEPB INHALE 2 PUFFS INTO THE LUNGS EVERY 6 HOURS AS NEEDED FOR DYSPNEA/WHEEZE 2 each 3  . promethazine-dextromethorphan (PROMETHAZINE-DM) 6.25-15 MG/5ML syrup Take 5 mLs  by mouth 4 (four) times daily as needed for cough. 150 mL 0  . spironolactone (ALDACTONE) 25 MG tablet TAKE 1 TABLET BY MOUTH TWICE A DAY 60 tablet 2  . diclofenac Sodium (VOLTAREN) 1 % GEL Apply 4 g topically 4 (four) times daily. 100 g 0  . fluconazole (DIFLUCAN) 100 MG tablet Take 1 tablet (100 mg total) by mouth daily. 3 tablet 0  . predniSONE (DELTASONE) 10 MG tablet Take  4 each am x 2 days,   2 each am x 2 days,  1 each am x 2 days and stop 14 tablet 0   No facility-administered medications prior to visit.     Per HPI unless specifically indicated in ROS section below Review of Systems  Constitutional: Negative for activity change, appetite change, chills, fatigue, fever and unexpected weight change.  HENT: Negative for hearing loss.   Eyes: Negative for visual disturbance.  Respiratory: Positive for shortness of breath and wheezing. Negative for cough and chest tightness.   Cardiovascular: Negative for chest pain, palpitations and leg swelling.  Gastrointestinal: Negative for abdominal distention, abdominal pain, blood in stool, constipation, diarrhea, nausea and vomiting.  Genitourinary: Negative for difficulty urinating and hematuria.  Musculoskeletal: Negative for arthralgias, myalgias and neck pain.  Skin: Negative for rash.  Neurological: Negative for dizziness, seizures, syncope and headaches.  Hematological: Negative for adenopathy. Does not bruise/bleed easily.  Psychiatric/Behavioral: Positive for dysphoric mood. The patient is not nervous/anxious.    Objective:    BP 124/68 (BP Location: Left Arm, Patient Position: Sitting, Cuff Size: Large)   Pulse 88   Temp 97.8 F (36.6 C) (Temporal)   Ht 5' (1.524 m)   Wt 224 lb 5 oz (101.7 kg)   LMP 03/08/2013   SpO2 99% Comment: 2 L, continuous  BMI 43.81 kg/m   Wt Readings from Last 3 Encounters:  08/07/19 224 lb 5 oz (101.7 kg)  05/30/19 225 lb (102.1 kg)  04/04/19 232 lb 6.4 oz (105.4 kg)    Physical Exam Vitals  and nursing note reviewed. Exam conducted with a chaperone present.  Constitutional:      Appearance: Normal appearance. She is well-developed. She is obese. She is not ill-appearing.  HENT:     Head: Normocephalic and atraumatic.     Right Ear: Hearing, tympanic membrane, ear canal and external ear normal.     Left Ear: Hearing, tympanic membrane, ear canal and external ear normal.     Mouth/Throat:     Pharynx: Uvula midline.  Eyes:     General: No scleral icterus.    Extraocular Movements: Extraocular movements intact.     Conjunctiva/sclera: Conjunctivae normal.     Pupils: Pupils are equal, round, and reactive to light.  Neck:     Thyroid: No thyromegaly or thyroid tenderness.  Cardiovascular:     Rate and Rhythm: Normal rate and regular rhythm.  Pulses: Normal pulses.          Radial pulses are 2+ on the right side and 2+ on the left side.     Heart sounds: Normal heart sounds. No murmur.  Pulmonary:     Effort: Pulmonary effort is normal. No respiratory distress.     Breath sounds: Normal breath sounds. No wheezing, rhonchi or rales.  Abdominal:     General: Abdomen is flat. Bowel sounds are normal. There is no distension.     Palpations: Abdomen is soft. There is no mass.     Tenderness: There is no abdominal tenderness. There is no guarding or rebound.     Hernia: No hernia is present.  Genitourinary:    General: Normal vulva.     Exam position: Supine.     Pubic Area: No rash.      Labia:        Right: No rash.        Left: No rash.      Vagina: Normal.     Cervix: Lesion present.     Uterus: Normal.      Adnexa: Right adnexa normal and left adnexa normal.     Comments: Pap smear performed on cervix. Possible lesion in os.  Musculoskeletal:        General: Normal range of motion.     Cervical back: Normal range of motion and neck supple.  Lymphadenopathy:     Cervical: No cervical adenopathy.  Skin:    General: Skin is warm and dry.     Findings: No rash.   Neurological:     Mental Status: She is alert and oriented to person, place, and time.     Comments: CN grossly intact, station and gait intact  Psychiatric:        Behavior: Behavior normal.        Thought Content: Thought content normal.        Judgment: Judgment normal.       Results for orders placed or performed in visit on 07/31/19  CBC with Differential/Platelet  Result Value Ref Range   WBC 14.4 (H) 3.8 - 10.8 Thousand/uL   RBC 4.34 3.80 - 5.10 Million/uL   Hemoglobin 13.2 11.7 - 15.5 g/dL   HCT 39.5 35.0 - 45.0 %   MCV 91.0 80.0 - 100.0 fL   MCH 30.4 27.0 - 33.0 pg   MCHC 33.4 32.0 - 36.0 g/dL   RDW 13.1 11.0 - 15.0 %   Platelets 314 140 - 400 Thousand/uL   MPV 10.0 7.5 - 12.5 fL   Neutro Abs 9,720 (H) 1,500 - 7,800 cells/uL   Lymphs Abs 3,226 850 - 3,900 cells/uL   Absolute Monocytes 850 200 - 950 cells/uL   Eosinophils Absolute 518 (H) 15 - 500 cells/uL   Basophils Absolute 86 0 - 200 cells/uL   Neutrophils Relative % 67.5 %   Total Lymphocyte 22.4 %   Monocytes Relative 5.9 %   Eosinophils Relative 3.6 %   Basophils Relative 0.6 %  Pathologist smear review  Result Value Ref Range   Path Review    Microalbumin / creatinine urine ratio  Result Value Ref Range   Microalb, Ur <0.7 0.0 - 1.9 mg/dL   Creatinine,U 38.6 mg/dL   Microalb Creat Ratio 1.8 0.0 - 30.0 mg/g  Hemoglobin A1c  Result Value Ref Range   Hgb A1c MFr Bld 6.4 4.6 - 6.5 %  Lipid panel  Result Value Ref Range   Cholesterol  100 0 - 200 mg/dL   Triglycerides 102.0 0.0 - 149.0 mg/dL   HDL 33.20 (L) >39.00 mg/dL   VLDL 20.4 0.0 - 40.0 mg/dL   LDL Cholesterol 46 0 - 99 mg/dL   Total CHOL/HDL Ratio 3    NonHDL 66.85   Comprehensive metabolic panel  Result Value Ref Range   Sodium 139 135 - 145 mEq/L   Potassium 4.8 3.5 - 5.1 mEq/L   Chloride 101 96 - 112 mEq/L   CO2 29 19 - 32 mEq/L   Glucose, Bld 107 (H) 70 - 99 mg/dL   BUN 20 6 - 23 mg/dL   Creatinine, Ser 1.16 0.40 - 1.20 mg/dL   Total  Bilirubin 0.4 0.2 - 1.2 mg/dL   Alkaline Phosphatase 106 39 - 117 U/L   AST 16 0 - 37 U/L   ALT 17 0 - 35 U/L   Total Protein 7.1 6.0 - 8.3 g/dL   Albumin 4.1 3.5 - 5.2 g/dL   GFR 47.49 (L) >60.00 mL/min   Calcium 9.6 8.4 - 10.5 mg/dL   Depression screen [facility] 2/9 08/07/2019 11/28/2018 07/27/2018 02/24/2017  Decreased Interest '2 1 1 3  ' Down, Depressed, Hopeless '2 2 1 3  ' PHQ - 2 Score '4 3 2 6  ' Altered sleeping '1 1 3 2  ' Tired, decreased energy '3 2 3 3  ' Change in appetite 2 0 0 2  Feeling bad or failure about yourself  1 2 0 0  Trouble concentrating '3 1 1 2  ' Moving slowly or fidgety/restless '3 2 2 ' 0  Suicidal thoughts 0 0 0 0  PHQ-9 Score '17 11 11 15  ' Some recent data might be hidden    GAD 7 : Generalized Anxiety Score 08/07/2019 11/28/2018 07/27/2018  Nervous, Anxious, on Edge '3 2 2  ' Control/stop worrying '3 1 1  ' Worry too much - different things '2 1 1  ' Trouble relaxing '2 1 2  ' Restless '2 2 3  ' Easily annoyed or irritable '2 1 1  ' Afraid - awful might happen '3 1 1  ' Total GAD 7 Score '17 9 11   ' Assessment & Plan:  This visit occurred during the SARS-CoV-2 public health emergency.  Safety protocols were in place, including screening questions prior to the visit, additional usage of staff PPE, and extensive cleaning of exam room while observing appropriate contact time as indicated for disinfecting solutions.   Problem List Items Addressed This Visit    Type 2 diabetes, controlled, with peripheral neuropathy (HCC)    Chronic, stable period on low dose metformin - continue.       Morbid obesity with BMI of 40.0-44.9, adult (HCC)    25 lb weight loss in the past year. Stable over the last 3 months. Continue to monitor.       Leukocytosis    Seems chronic.periph smear stable. Continue to monitor. ?chronic inflammatory state.       HYPERTRIGLYCERIDEMIA    Chronic, stable. Continue atorvastatin. The ASCVD Risk score Mikey Bussing DC Jr., et al., 2013) failed to calculate for the following reasons:    The valid total cholesterol range is 130 to 320 mg/dL       HYPERTENSION, BENIGN ESSENTIAL    Chronic, stable. Continue spironolactone and lasix. Anticipate improved control with weight loss.       Health maintenance examination - Primary    Preventative protocols reviewed and updated unless pt declined. Discussed healthy diet and lifestyle.       COPD  GOLD  II/III still smoking/ 02 dep     Appreciate pulm care.      CKD (chronic kidney disease) stage 3, GFR 30-59 ml/min    Persistent since last year - encouraged limiting NSAID, increased water intake.       Cigarette smoker    Continue to encourage cessation. Down to 5 cig/day      Chronic respiratory failure with hypoxia (HCC)    Supplemental O2 use.       Asthmatic bronchitis    Appreciate pulm care.  On chronic oxygen. Handicap placard filled out.       Advanced care planning/counseling discussion    Advanced directive - Chauncey Reading is daughter Janett Billow). Has advanced directive at home. Will bring me copy.       Adjustment disorder with anxiety    Struggled with husband's death, now new stressor of best friend's critical illness. Continue lexapro with lorazepam PRN.        Abnormal appearance of cervix    Possible swelling in cervical os - will await pap and consider GYN referral.        Other Visit Diagnoses    Encounter for annual routine gynecological examination       Relevant Orders   Cytology - PAP   Special screening for malignant neoplasms, colon       Relevant Orders   Fecal occult blood, imunochemical       No orders of the defined types were placed in this encounter.  Orders Placed This Encounter  Procedures  . Fecal occult blood, imunochemical    Standing Status:   Future    Standing Expiration Date:   08/06/2020    Patient instructions: Pass by lab to pick up stool kit.  Call to schedule mammogram at your convenience: [place] [phone] Bring me copy of your living  will. Increase water intake for kidneys.  Sugars are doing well.  Return as needed or in 6 months for follow up visit.   Follow up plan: Return in about 6 months (around 02/07/2020) for follow up visit.  Ria Bush, MD

## 2019-08-07 NOTE — Assessment & Plan Note (Signed)
Possible swelling in cervical os - will await pap and consider GYN referral.

## 2019-08-07 NOTE — Assessment & Plan Note (Signed)
Appreciate pulm care.  ?

## 2019-08-07 NOTE — Assessment & Plan Note (Signed)
Struggled with husband's death, now new stressor of best friend's critical illness. Continue lexapro with lorazepam PRN.

## 2019-08-07 NOTE — Assessment & Plan Note (Signed)
Continue to encourage cessation. Down to 5 cig/day. 

## 2019-08-07 NOTE — Assessment & Plan Note (Signed)
Advanced directive - Brenda [name] is daughter Brenda [name]). Has advanced directive at home. Will bring me copy.

## 2019-08-07 NOTE — Assessment & Plan Note (Signed)
Supplemental O2 use.

## 2019-08-07 NOTE — Assessment & Plan Note (Signed)
Chronic, stable period on low dose metformin - continue.

## 2019-08-07 NOTE — Assessment & Plan Note (Signed)
Seems chronic.periph smear stable. Continue to monitor. ?chronic inflammatory state.

## 2019-08-07 NOTE — Assessment & Plan Note (Signed)
25 lb weight loss in the past year. Stable over the last 3 months. Continue to monitor.

## 2019-08-07 NOTE — Assessment & Plan Note (Signed)
Chronic, stable. Continue atorvastatin  The ASCVD Risk score (Goff DC Jr., et al., 2013) failed to calculate for the following reasons:   The valid total cholesterol range is 130 to 320 mg/dL  

## 2019-08-08 ENCOUNTER — Other Ambulatory Visit: Payer: Self-pay | Admitting: Family Medicine

## 2019-08-09 ENCOUNTER — Other Ambulatory Visit: Payer: Self-pay | Admitting: Family Medicine

## 2019-08-09 LAB — CYTOLOGY - PAP
Comment: NEGATIVE
Diagnosis: NEGATIVE
High risk HPV: NEGATIVE

## 2019-08-10 NOTE — Telephone Encounter (Signed)
ERx 

## 2019-08-10 NOTE — Telephone Encounter (Signed)
Name of Medication: Lorazepam Name of Pharmacy: [facility] Last Fill or Written Date and Quantity: 06/19/19, #30 Last Office Visit and Type: 08/07/19 Next Office Visit and Type: none Last Controlled Substance Agreement Date: none Last UDS: none

## 2019-08-16 ENCOUNTER — Other Ambulatory Visit (INDEPENDENT_AMBULATORY_CARE_PROVIDER_SITE_OTHER): Payer: Medicaid Other

## 2019-08-16 DIAGNOSIS — Z1211 Encounter for screening for malignant neoplasm of colon: Secondary | ICD-10-CM | POA: Diagnosis not present

## 2019-08-16 LAB — FECAL OCCULT BLOOD, IMMUNOCHEMICAL: Fecal Occult Bld: NEGATIVE

## 2019-08-25 ENCOUNTER — Other Ambulatory Visit: Payer: Self-pay | Admitting: Family Medicine

## 2019-08-25 DIAGNOSIS — N889 Noninflammatory disorder of cervix uteri, unspecified: Secondary | ICD-10-CM

## 2019-08-29 ENCOUNTER — Ambulatory Visit: Payer: Medicaid Other | Admitting: Internal Medicine

## 2019-09-15 ENCOUNTER — Encounter: Payer: Self-pay | Admitting: Internal Medicine

## 2019-09-15 ENCOUNTER — Ambulatory Visit (INDEPENDENT_AMBULATORY_CARE_PROVIDER_SITE_OTHER): Payer: Medicaid Other | Admitting: Internal Medicine

## 2019-09-15 ENCOUNTER — Other Ambulatory Visit: Payer: Self-pay

## 2019-09-15 DIAGNOSIS — F17210 Nicotine dependence, cigarettes, uncomplicated: Secondary | ICD-10-CM

## 2019-09-15 DIAGNOSIS — J449 Chronic obstructive pulmonary disease, unspecified: Secondary | ICD-10-CM

## 2019-09-15 MED ORDER — PROMETHAZINE-DM 6.25-15 MG/5ML PO SYRP: 5.0000 mL | ORAL_SOLUTION | Freq: Four times a day (QID) | ORAL | 0 refills | Status: DC | PRN

## 2019-09-15 MED ORDER — AZITHROMYCIN 250 MG PO TABS: ORAL_TABLET | ORAL | 0 refills | Status: DC

## 2019-09-15 MED ORDER — PREDNISONE 10 MG PO TABS: ORAL_TABLET | ORAL | 0 refills | Status: DC

## 2019-09-15 NOTE — Progress Notes (Addendum)
Subjective:   Patient ID: Brenda [name], female    DOB: 02-Oct-1960    MRN: [id number]   Brief patient profile:  25  yowf  MM/ Active smoker wt  07/2015 = 250 and admitted with" aecopd" but with pfts nearly nl 2/29/15 p rx so was characterized as GOLD 0/ AB  But by 09/2017 met criteria for GOLD II    History of Present Illness  10/20/2017  f/u ov/Brenda [name] re:  A B/  still smoking  And now meets criteria for GOLD II maint on dulera/spiriva  Chief Complaint  Patient presents with  . Follow-up    Pt had full PFT prior to OV today. Pt has SOB with exertion, productive cough-yellow, wheezing, some chest tightness. Pt is on 2 liters of O2 con't.  Dyspnea:  Food lion struggle on 3lpm  Cough: better Sleep: cpap/ 30 degrees SABA use:  Needing less  rec No change  rx     01/19/2018  f/u ov/Brenda [name] re: GOLD II and still smoking/ maint on symb/spiriva/ 02 2-3lpm and using med calendar well  Chief Complaint  Patient presents with  . Follow-up    pt c/o worsening sob worse with warm weather.     Dyspnea:  MMRC3 = can't walk 100 yards even at a slow pace at a flat grade s stopping due to sob even on 3lpm  Cough: some worse since hot weather . 4-5 days  Sleeping: 30 degrees/cpap 3lpm SABA FBP:ZWCH daily though hfa suboptimal/ short ti rec For cough add gabapentin 300 mg at lunch and supper    NP eval   04/22/18 acute flare AB  Doxycycline 132m Twice daily  For 1 week , take with food.  Prednisone taper over next week. Call if blood sugar >250 .  Mucinex DM Twice daily  As needed  Cough /congestion  Use oxygen 2l/m  Wear CPAP  At bedtime and with naps. Try to get in at least 4hr .  Work on not smoking . Set a quit date.  Continue on SGreece Rinse after use.  Use Albuterol Neb every 4hrs as needed.  Follow up with PCP or Ortho for left leg injury .  Follow up with with Dr. WMelvyn Novas In 2-3  Months  and As needed   Please contact office for sooner follow up if symptoms do not improve or  worsen or seek emergency care  Get flu shot next week if better.    06/20/2018  f/u ov/Brenda [name] re: gold II copd/ still smoking maint rx dulera 200/spiriva Chief Complaint  Patient presents with  . Follow-up    SOB majority of the time x2 weeks, wheezing, cough with thick yellow mucus   Dyspnea:  Gradually Worse x 2 weeks Cough: thick mucus turning greener esp in am  Sleeping: on cpap/ nasal congestion with dry 02  SABA use: baseline couple times a day at most but x last two weeks 4 x daily and neb which helps more 02: 2-3lpm NP rec Augmentin 875 mg take one pill twice daily  X 10 days - take at breakfast and supper with large glass of water.  It would help reduce the usual side effects (diarrhea and yeast infections) if you ate cultured yogurt at lunch.  Prednisone 10 mg take  4 each am x 2 days,   2 each am x 2 days,  1 each am x 2 days and stop      08/01/2018  f/u ov/Brenda [name] re: GOLD  II/III spirometry but still smoking/ 02 dep on duler 200/spiriva smi Chief Complaint  Patient presents with  . Follow-up    mildly cong cough (clear to creamy color) but doing much better  Dyspnea:  Shopping better / not checking 02 while ex = /MMRC2 = can't walk a nl pace on a flat grade s sob but does fine slow and flat   Cough: occ / clear mucus  Sleeping:  Off cpap x 3 weeks 30 degrees electric bed  SABA use: rarely and never noct  02: 2lpm daytime/ 3lpm hs  rec No change in medications Be sure you keeps your sats above 90% with walking     02/08/2019  f/u ov/Brenda [name] re: GOLD II /II 02 dep at hs and walking/ maint on dulera 200  Chief Complaint  Patient presents with  . Follow-up    6 month f/u for COPD. States her breathing has been ok since her last visit.   Dyspnea:  MMRC2 = can't walk a nl pace on a flat grade s sob but does fine slow and flat on 2lpm never checks sats as rec  Cough: sporadic / min mucoid esp in am/ still smoking  Sleeping: cpap and 3lpm SABA use:  hfa more often = twice daily ,  neb x 2 last week  02: none at rest up to 2lpm walking  rec Goal is to keep 02 saturations above 90% with all activities   Prednisone 10 mg take  4 each am x 2 days,   2 each am x 2 days,  1 each am x 2 days and stop  The key is to stop smoking completely before smoking completely stops you!        05/30/2019  f/u ov/Brenda [name] re:  COPD II/III spirometry but 02 dep maint  on dulera 200 2bid/ still smoking   Chief Complaint  Patient presents with  . Follow-up    increased nasal and chest congestion x 2 wks. She is coughing up some clear to dark yellow sputum. She is having some wheezing and chest tightness-using her proair and neb both several times per day.   Dyspnea: no change = MMRC2 = can't walk a nl pace on a flat grade s sob but does fine slow and flat does one step at time / also limited by knees esp L x 4 weeks with swelling LLE  Cough: acutely worse x 2 weeks some dark mucus assoc with nasal congestion Sleeping: difficult on cpap 45 degrees hob and 2lpm   SABA use: helps for an hour 02: 2 lpm hs, 3lpm cont when walking/ at rest sits RA low 90s rec Augmentin 875 mg take one pill twice daily  X 10 days - take at breakfast and supper with large glass of water.  It would help reduce the usual side effects (diarrhea and yeast infections) if you ate cultured yogurt at lunch.  Diflucan 100 mg daily x 3 days at end of augmentin Prednisone 10 mg take  4 each am x 2 days,   2 each am x 2 days,  1 each am x 2 days and stop  Try breztri samples  to see what if any difference you note and if covered change to it instead of dulera  I emphasized that nasal steroids (flonase) have no immediate benefit Add venous dopplers> done 05/30/2019  Neg dvt/ pos for fluid around patella tendon > referred to ortho  >>> f/u in 6 months - consider restarting LDSCT then (  post pandemic)     09/15/2019  f/u ov/Brenda [name] re: COPD II/III still smoking acute flare on breztri 2 bid / not using med calendar/ action plan  prev provided  Chief Complaint  Patient presents with  . Follow-up    Breathing worse over the past 10 days. She is coughing up yellow sputum and has has some yellow nasal d/c. She also c/o wheezing. She is using her proair about every 4 hours and neb at least 2 x per day.   Dyspnea:  MMRC3 = can't walk 100 yards even at a slow pace at a flat grade s stopping due to sob   Cough: x 10 days esp in am more prod assoc with same pnds/nasal discharge Sleeping: cpap/ 45 degrees  SABA use: hfa and neb  02: 2lpm into cpap, 2lpm adjusts to how she feels    No obvious day to day or daytime variability or assoc excess/ purulent sputum or mucus plugs or hemoptysis or cp or chest tightness,   or overt sinus or hb symptoms.   Sleeping as above without nocturnal  exacerbation  of respiratory  c/o's or need for noct saba. Also denies any obvious fluctuation of symptoms with weather or environmental changes or other aggravating or alleviating factors except as outlined above   No unusual exposure hx or h/o childhood pna/ asthma or knowledge of premature birth.  Current Allergies, Complete Past Medical History, Past Surgical History, Family History, and Social History were reviewed in [employer] record.  ROS  The following are not active complaints unless bolded Hoarseness, sore throat, dysphagia, dental problems, itching, sneezing,  nasal congestion or discharge of excess mucus or purulent secretions, ear ache,   fever, chills, sweats, unintended wt loss or wt gain, classically pleuritic or exertional cp,  orthopnea pnd or arm/hand swelling  or leg swelling, presyncope, palpitations, abdominal pain, anorexia, nausea, vomiting, diarrhea  or change in bowel habits or change in bladder habits, change in stools or change in urine, dysuria, hematuria,  rash, arthralgias, visual complaints, headache, numbness, weakness or ataxia or problems with walking or coordination,  change in mood or   memory.        Current Meds  Medication Sig  . albuterol (PROVENTIL) (2.5 MG/3ML) 0.083% nebulizer solution INHALE 1 VIAL VIA NEBULIZER EVERY 6 HOURS AS NEEDED FOR WHEEZING/SHORTNESS OF BREATH  . atorvastatin (LIPITOR) 20 MG tablet TAKE 1 TABLET (20 MG TOTAL) BY MOUTH DAILY AT 6 PM.  . benzonatate (TESSALON) 200 MG capsule Take 1 capsule (200 mg total) by mouth 3 (three) times daily as needed for cough. Swallow whole, to not bite pill  . Budeson-Glycopyrrol-Formoterol (BREZTRI AEROSPHERE) 160-9-4.8 MCG/ACT AERO Inhale 2 puffs into the lungs 2 (two) times daily.  Marland Kitchen dextromethorphan-guaiFENesin (MUCINEX DM) 30-600 MG 12hr tablet Take 1 tablet by mouth 2 (two) times daily as needed for cough.  . escitalopram (LEXAPRO) 20 MG tablet TAKE 1 AND 1/2 TABLETS DAILY BY MOUTH  . famotidine (PEPCID) 20 MG tablet TAKE 1 TABLET BY MOUTH EVERYDAY AT BEDTIME  . fluticasone (FLONASE) 50 MCG/ACT nasal spray Place 1 spray into both nostrils 2 (two) times daily.  . furosemide (LASIX) 20 MG tablet TAKE 2 TABLETS BY MOUTH EVERY DAY  . gabapentin (NEURONTIN) 300 MG capsule Take 2 tablets in the morning and 3 at night PO  . LORazepam (ATIVAN) 0.5 MG tablet TAKE 1/2 TO 1 TABLET BY MOUTH TWICE A DAY AS NEEDED FOR ANXIETY  . metFORMIN (GLUCOPHAGE) 500 MG  tablet TAKE 1 TABLET BY MOUTH EVERYDAY AT BEDTIME  . Multiple Vitamin (MULTIVITAMIN WITH MINERALS) TABS tablet Take 1 tablet by mouth daily.  Marland Kitchen nystatin (MYCOSTATIN) 100000 UNIT/ML suspension TAKE 1 TEASPOONFUL BY MOUTH 3 TIMES DAILY AS NEEDED FOR MOUTH SORES.  Marland Kitchen OXYGEN 2 lpm with sleep with CPAP and during the day with exertion  . pantoprazole (PROTONIX) 40 MG tablet TAKE 1 TABLET BY MOUTH EVERY DAY 30 TO 60 MINUTES BEFORE THE FIRST MEAL OF THE DAY  . PROAIR RESPICLICK 161 (90 Base) MCG/ACT AEPB INHALE 2 PUFFS INTO THE LUNGS EVERY 6 HOURS AS NEEDED FOR DYSPNEA/WHEEZE  . spironolactone (ALDACTONE) 25 MG tablet TAKE 1 TABLET BY MOUTH TWICE A DAY                Objective:   Physical Exam    Amb wf nad / rattling cough   09/15/2019      221  05/30/2019    225  10/06/2013         288 > 11/17/2013  288> 07/30/2015  266  > 08/12/2017   284 > 09/16/2017  278 > 01/19/2018   270 > . 06/20/2018   262 > 08/01/2018   252 > 02/08/2019     239    09/22/13 293 lb 12.8 oz (133.267 kg)  09/18/13 289 lb 4 oz (131.203 kg)  08/21/13 283 lb 8 oz (128.595 kg)      Vital signs reviewed  09/15/2019  - Note at rest 02 sats  99% on 2lpm cont       LUNGS: no acc muscle use,  Mild barrel  contour chest   1+ pitting on L none on R    HEENT : pt wearing mask not removed for exam due to covid - 19 concerns.    NECK :  without JVD/Nodes/TM/ nl carotid upstrokes bilaterally   LUNGS: no acc muscle use,  Mild barrel  contour chest wall with bilateral  insp /exp rhonchi  and  without cough on insp or exp maneuvers  and mild  Hyperresonant  to  percussion bilaterally     CV:  RRR  no s3 or murmur or increase in P2, and  L > R trace LE pitting edema   ABD:  soft and nontender with pos end  insp Hoover's  in the supine position. No bruits or organomegaly appreciated, bowel sounds nl  MS:   Nl gait/  ext warm without deformities, calf tenderness, cyanosis or clubbing No obvious joint restrictions   SKIN: warm and dry without lesions    NEURO:  alert, approp, nl sensorium with  no motor or cerebellar deficits apparent.               Assessment & Plan:

## 2019-09-15 NOTE — Patient Instructions (Signed)
zpak   Prednisone 10 mg take  4 each am x 2 days,   2 each am x 2 days,  1 each am x 2 days and stop   I emphasized that nasal steroids (flonase) have no immediate benefit in terms of improving symptoms.  To help them reached the target tissue, the patient should use Afrin two puffs every 12 hours applied one min before using the nasal steroids.  Afrin should be stopped after no more than 5 days.  If the symptoms worsen, Afrin can be restarted after 5 days off of therapy to prevent rebound congestion from overuse of Afrin.  I also emphasized that in no way are nasal steroids a concern in terms of "addiction".    For severe cough not responding to mucinex dm change to promethazine dm  One tsp every 4 hours   The key is to stop smoking completely before smoking completely stops you!  For smoking cessation info call [phone]      See Tammy NP in 3 months with all your medications, even over the counter meds, separated in two separate bags, the ones you take no matter what vs the ones you stop once you feel better and take only as needed when you feel you need them.   Tammy  will generate for you a new user friendly medication calendar that will put [place] all on the same page re: your medication use.     Without this process, it simply isn't possible to assure that we are providing  your outpatient care  with  the attention to detail we feel you deserve.   If we cannot assure that you're getting that kind of care,  then we cannot manage your problem effectively from this clinic.  Once you have seen Tammy and we are sure that we're all on the same page with your medication use she will arrange follow up with me.

## 2019-09-16 ENCOUNTER — Encounter: Payer: Self-pay | Admitting: Internal Medicine

## 2019-09-16 NOTE — Assessment & Plan Note (Signed)
Active smoker - alpha one AT screen   03/25/2015  MM level 176  Last worked Jun 29 2017 seeking full disability   - PFT's  10/20/2017  FEV1 1.16 (50 % ) ratio 58  p 14 % improvement from saba p dulera/spriva prior to study with DLCO  78 % corrects to 94 % for alv volume  erv  15%  - 05/30/2019  After extensive coaching inhaler device,  effectiveness =    90% > try bevespi 2 bid    Group D in terms of symptom/risk and laba/lama/ICS  therefore appropriate rx at this point >>>  Continue breztri/ rx zpak/pred x 6 days  See calendar for specific medication instructions and bring it back for each and every office visit for every healthcare provider you see.  Without it,  you may not receive the best quality medical care that we feel you deserve    Each maintenance medication was reviewed in detail including most importantly the difference between maintenance and as needed and under what circumstances the prns are to be used. This was done in the context of a medication calendar review which provided the patient with a user-friendly unambiguous mechanism for medication administration and reconciliation and provides an action plan for all active problems. It is critical that this be shown to every doctor  for modification during the office visit if necessary so the patient can use it as a working document.

## 2019-09-16 NOTE — Assessment & Plan Note (Addendum)
Counseled re importance of smoking cessation but did not meet time criteria for separate billing             Total time for H and P, chart review, counseling, teaching device and generating customized AVS unique to this office visit / charting = 20 min

## 2019-09-19 ENCOUNTER — Other Ambulatory Visit: Payer: Self-pay | Admitting: Family Medicine

## 2019-09-19 NOTE — Telephone Encounter (Signed)
Gabapentin Last filled:  06/09/19, #450 Last OV:  08/07/19, CPE Next OV:  none

## 2019-09-25 ENCOUNTER — Other Ambulatory Visit: Payer: Self-pay | Admitting: Internal Medicine

## 2019-09-26 ENCOUNTER — Encounter: Payer: Self-pay | Admitting: Orthopedic Surgery

## 2019-09-26 ENCOUNTER — Ambulatory Visit (INDEPENDENT_AMBULATORY_CARE_PROVIDER_SITE_OTHER): Payer: Medicaid Other | Admitting: Orthopedic Surgery

## 2019-09-26 ENCOUNTER — Other Ambulatory Visit: Payer: Self-pay

## 2019-09-26 VITALS — Ht 60.0 in | Wt 221.0 lb

## 2019-09-26 DIAGNOSIS — R2241 Localized swelling, mass and lump, right lower limb: Secondary | ICD-10-CM | POA: Diagnosis not present

## 2019-09-26 MED ORDER — OXYCODONE-ACETAMINOPHEN 5-325 MG PO TABS: 1.0000 | ORAL_TABLET | ORAL | 0 refills | Status: DC | PRN

## 2019-09-26 MED ORDER — OXYCODONE-ACETAMINOPHEN 5-325 MG PO TABS: 1.0000 | ORAL_TABLET | Freq: Three times a day (TID) | ORAL | 0 refills | Status: DC | PRN

## 2019-09-27 ENCOUNTER — Encounter: Payer: Self-pay | Admitting: Orthopedic Surgery

## 2019-09-27 DIAGNOSIS — R2241 Localized swelling, mass and lump, right lower limb: Secondary | ICD-10-CM

## 2019-09-27 MED ORDER — LIDOCAINE HCL (PF) 1 % IJ SOLN
5.0000 mL | INTRAMUSCULAR | Status: AC | PRN
  Administered 2019-09-27: 5 mL

## 2019-09-27 MED ORDER — METHYLPREDNISOLONE ACETATE 40 MG/ML IJ SUSP
40.0000 mg | INTRAMUSCULAR | Status: AC | PRN
  Administered 2019-09-27: 15:00:00 40 mg via INTRA_ARTICULAR

## 2019-09-27 NOTE — Progress Notes (Signed)
Office Visit Note   Patient: Brenda [name]           Date of Birth: [birth year]           MRN: [id number] Visit Date: 09/26/2019              Requested by: [name], MD [address] [place],  [place] [address] PCP: [name], MD  Chief Complaint  Patient presents with  . Right Knee - Pain  . Left Knee - Pain      HPI: Patient is a 60 year old woman with revision total knee arthroplasty on the right with osteoarthritis of her left knee.  She states that she has been having pain from a superficial calcified mass on the medial aspect of the right knee.  Assessment & Plan: Visit Diagnoses:  1. Mass of right knee     Plan: Discussed that we could proceed with an injection for the left knee she wanted to proceed with the injection discussed potential for knee replacement on the left and patient states she is not ready to proceed with surgery.  Discussed that the superficial heterotopic ossification medial aspect of the right knee appears soft mobile and just beneath the skin beneath a previous reconstruction surgical incision.  Discussed that we could proceed with removing this as an outpatient surgery.  Follow-Up Instructions: Return if symptoms worsen or fail to improve.   Ortho Exam  Patient is alert, oriented, no adenopathy, well-dressed, normal affect, normal respiratory effort. Examination patient has a 6 mobile calcified mass just beneath the skin medial aspect of the right knee it is 2 x 3 cm is directly beneath a previous surgical incision for ligamentous reconstruction.  Varus and valgus stress is stable.  Patient is currently on nasal cannula FiO2.  Examination the left knee she has pain to palpation of the medial joint line crepitation range of motion collaterals are cruciates are stable.  Radiograph shows varus collapse with bone-on-bone contact medial joint line.  Patient states she recently has had an ultrasound and this was negative for DVT on the  left.  Imaging: No results found. No images are attached to the encounter.  Labs: Lab Results  Component Value Date   HGBA1C 6.4 07/31/2019   HGBA1C 6.3 (A) 04/04/2019   HGBA1C 6.7 (A) 11/28/2018   REPTSTATUS 06/05/2013 FINAL 05/30/2013   CULT  05/30/2013    NO GROWTH 5 DAYS Performed at [facility]   LABORGA  01/12/2017    Three or more organisms present,each greater than 10,000 CFU/mL.These organisms,commonly found on external and internal genitalia,are considered to be colonizers.No further testing performed.      Lab Results  Component Value Date   ALBUMIN 4.1 07/31/2019   ALBUMIN 4.3 07/22/2018   ALBUMIN 4.2 02/24/2017    Lab Results  Component Value Date   MG 2.5 (H) 07/26/2015   MG 2.6 (H) 07/25/2015   MG 2.5 (H) 07/24/2015   No results found for: VD25OH  No results found for: PREALBUMIN CBC EXTENDED Latest Ref Rng & Units 07/31/2019 07/22/2018 08/04/2017  WBC 3.8 - 10.8 Thousand/uL 14.4(H) 15.4(H) 15.0(H)  RBC 3.80 - 5.10 Million/uL 4.34 4.74 4.64  HGB 11.7 - 15.5 g/dL 72.6 20.3 55.9  HCT 74.1 - 45.0 % 39.5 43.0 40.9  PLT 140 - 400 Thousand/uL 314 353.0 280.0  NEUTROABS 1,500 - 7,800 cells/uL 9,720(H) 10.0(H) 10.5(H)  LYMPHSABS 850 - 3,900 cells/uL 3,226 3.7 3.1     Body mass index is  43.16 kg/m.  Orders:  No orders of the defined types were placed in this encounter.  Meds ordered this encounter  Medications  . DISCONTD: oxyCODONE-acetaminophen (PERCOCET/ROXICET) 5-325 MG tablet    Sig: Take 1 tablet by mouth every 4 (four) hours as needed for severe pain.    Dispense:  60 tablet    Refill:  0  . oxyCODONE-acetaminophen (PERCOCET/ROXICET) 5-325 MG tablet    Sig: Take 1 tablet by mouth every 8 (eight) hours as needed for severe pain.    Dispense:  21 tablet    Refill:  0     Procedures: Large Joint Inj: L knee on 09/27/2019 3:12 PM Indications: pain and diagnostic evaluation Details: 22 G 1.5 in needle, anteromedial  approach  Arthrogram: No  Medications: 5 mL lidocaine (PF) 1 %; 40 mg methylPREDNISolone acetate 40 MG/ML Outcome: tolerated well, no immediate complications Procedure, treatment alternatives, risks and benefits explained, specific risks discussed. Consent was given by the patient. Immediately prior to procedure a time out was called to verify the correct patient, procedure, equipment, support staff and site/side marked as required. Patient was prepped and draped in the usual sterile fashion.      Clinical Data: No additional findings.  ROS:  All other systems negative, except as noted in the HPI. Review of Systems  Objective: Vital Signs: Ht 5' (1.524 m)   Wt 221 lb (100.2 kg)   LMP 03/08/2013   BMI 43.16 kg/m   Specialty Comments:  No specialty comments available.  PMFS History: Patient Active Problem List   Diagnosis Date Noted  . Abnormal appearance of cervix 08/07/2019  . CKD (chronic kidney disease) stage 3, GFR 30-59 ml/min 08/07/2019  . Knee mass, right 11/28/2018  . Advanced care planning/counseling discussion 07/27/2018  . Unilateral primary osteoarthritis, left knee 10/28/2017  . COPD  GOLD II/III still smoking/ 02 dep  10/20/2017  . Cor pulmonale (chronic) ([place]) 09/16/2017  . Diastolic dysfunction 88/41/6606  . Edema 08/04/2017  . Grief 02/24/2017  . Chronic respiratory failure with hypoxia ([place]) 07/31/2015  . Type 2 diabetes, controlled, with peripheral neuropathy ([place])   . OSA on CPAP 09/14/2014  . Health maintenance examination 09/14/2014  . Upper airway cough syndrome 09/22/2013  . Chronic pain syndrome 05/30/2013  . Migraine, unspecified, without mention of intractable migraine without mention of status migrainosus 05/30/2013  . Pyelonephritis 05/05/2012  . Asthmatic bronchitis 02/27/2008  . Leukocytosis 02/22/2008  . NONSPECIFIC ABNORMAL ELECTROCARDIOGRAM 02/22/2008  . HYPERTENSION, BENIGN ESSENTIAL 06/23/2007  . HYPERTRIGLYCERIDEMIA 06/01/2007   . Morbid obesity with BMI of 40.0-44.9, adult ([place]) 06/01/2007  . Adjustment disorder with anxiety 06/01/2007  . Cigarette smoker 06/01/2007  . Left leg swelling 05/04/2007   Past Medical History:  Diagnosis Date  . Acute pancreatitis   . Asthma    main dyspnea thought due to deconditioning (10/2013)  . Bipolar I disorder, most recent episode (or current) manic, unspecified    pt denies this  . Bronchitis, chronic obstructive ([place]) 2008 & 2009   FeV1 64% TLC 105% DLCO 55% 2008  -  FeV1 81% FeF 25-75 43% 2009  . COPD (chronic obstructive pulmonary disease) ([facility])    emphysema, not an issue per pulm (10/2013)  . History of pyelonephritis 05/2012  . HTN (hypertension)   . Hx of migraines   . Knee pain, right   . Leukocytosis, unspecified   . Lumbar disc disease with radiculopathy    multilevel spondylitic changes, scoliosis, and anterolisthesis L4 not thought to currently  be surg candidate (Dr. Phoebe Perch, [facility]) (Dr. Ollen Bowl, [facility])  . Nicotine addiction   . Nonspecific abnormal electrocardiogram (ECG) (EKG)   . Obesity   . Pure hyperglyceridemia   . Suicide attempt ([facility]) 06/2001  . Swelling of limb   . Urge and stress incontinence 07/2012   ([place])    Family History  Problem Relation Age of Onset  . Heart disease Brother        MI in early 67's  . Hypertension Mother   . Hyperlipidemia Mother   . Hypertension Father   . Asthma Father   . Breast cancer Paternal Grandmother     Past Surgical History:  Procedure Laterality Date  . CHOLECYSTECTOMY  07/1982  . CT MAXILLOFACIAL WO/W CM  06/14/06   Left max mucocele  . CT of chest  06/14/2006   Normal except c/w active inflammation / infection.  Left renal atrophy  . ERCP / sphincterotomy - stenosis  01/15/06  . lumbar MRI  11/2010   multilevel spondylitic changes with upper lumbar scoliosis and anterolisthesis of L4 on 5 with some L foraminal narrowing esp at L/3 with concavity of scoliosis, some central stenosis and  biforaminal narrowing at L4/5 with spondylolisthesis  . [place] - Pneumonia, acute asthma, left maxillary sinusitis  01/11 - 06/18/2006  . Retained stone     ERCP secondary to retained stone  . Right knee surgery  1984,1989,1989,1991,1994   Reconstructions for ACL insuff  . TUBAL LIGATION     Social History   Occupational History  . Occupation: Administrator, arts: [employer]     Employer: [employer]   Tobacco Use  . Smoking status: Current Some Day Smoker    Years: 41.00    Types: Cigarettes  . Smokeless tobacco: Never Used  . Tobacco comment: 3 cigs per day 05/29/19 lmr  Substance and Sexual Activity  . Alcohol use: No    Alcohol/week: 0.0 standard drinks  . Drug use: No  . Sexual activity: Not on file

## 2019-10-01 ENCOUNTER — Other Ambulatory Visit: Payer: Self-pay | Admitting: Family Medicine

## 2019-10-01 ENCOUNTER — Other Ambulatory Visit: Payer: Self-pay | Admitting: Internal Medicine

## 2019-10-05 ENCOUNTER — Other Ambulatory Visit: Payer: Self-pay | Admitting: Family Medicine

## 2019-10-06 NOTE — Telephone Encounter (Signed)
ERx 

## 2019-10-06 NOTE — Telephone Encounter (Signed)
Name of Medication: Lorazepam Name of Pharmacy: [facility] [address] Last Fill or Written Date and Quantity: 08/10/19, #30 Last Office Visit and Type: 08/07/19, CPE Next Office Visit and Type: none Last Controlled Substance Agreement Date: none Last UDS: none

## 2019-11-12 ENCOUNTER — Other Ambulatory Visit: Payer: Self-pay | Admitting: Family Medicine

## 2019-11-13 ENCOUNTER — Ambulatory Visit (INDEPENDENT_AMBULATORY_CARE_PROVIDER_SITE_OTHER): Payer: Medicaid Other | Admitting: Family Medicine

## 2019-11-13 ENCOUNTER — Other Ambulatory Visit: Payer: Self-pay

## 2019-11-13 ENCOUNTER — Encounter: Payer: Self-pay | Admitting: Family Medicine

## 2019-11-13 VITALS — BP 144/85 | HR 84 | Ht 61.0 in | Wt 216.0 lb

## 2019-11-13 DIAGNOSIS — N889 Noninflammatory disorder of cervix uteri, unspecified: Secondary | ICD-10-CM

## 2019-11-13 NOTE — Progress Notes (Signed)
   Subjective:    Patient ID: Brenda [name] is a 59 y.o. female presenting with Gynecologic Exam  on 11/13/2019  HPI: New patient referral with complicated medical history who was seen by PCP for pap in 07/2019 and had normal pap. Noted to have ? Lesion on cervix and sent here for eval. LNMP 2014--no further bleeding.  Review of Systems  Constitutional: Negative for chills and fever.  Respiratory: Negative for shortness of breath.   Cardiovascular: Negative for chest pain.  Gastrointestinal: Negative for abdominal pain, nausea and vomiting.  Genitourinary: Negative for dysuria.  Skin: Negative for rash.      Objective:    BP (!) 144/85   Pulse 84   Ht 5\' 1"  (1.549 m)   Wt 216 lb (98 kg)   LMP 03/08/2013   BMI 40.81 kg/m  Physical Exam Constitutional:      General: She is not in acute distress.    Appearance: She is well-developed.  HENT:     Head: Normocephalic and atraumatic.  Eyes:     General: No scleral icterus. Cardiovascular:     Rate and Rhythm: Normal rate.  Pulmonary:     Effort: Pulmonary effort is normal.  Abdominal:     Palpations: Abdomen is soft.  Genitourinary:    Comments: BUS normal, vagina is pink and rugated, cervix is parous, posterior and without lesion  Musculoskeletal:     Cervical back: Neck supple.  Skin:    General: Skin is warm and dry.  Neurological:     Mental Status: She is alert and oriented to person, place, and time.         Assessment & Plan:  Cervix abnormality - ? but appears completely normal today with good exam.   Total time: 30 minutes.  Return if symptoms worsen or fail to improve.  05/08/2013 11/13/2019 4:02 PM

## 2019-11-13 NOTE — Progress Notes (Signed)
Was referred by PCP due to having a swollen cervix, pap was normal 08/07/2019

## 2019-11-13 NOTE — Telephone Encounter (Signed)
Last filled 10-06-19 #30 Last OV 08-07-19 No Future OV [facility]

## 2019-11-17 NOTE — Telephone Encounter (Signed)
ERx 

## 2019-12-15 ENCOUNTER — Encounter: Payer: Medicaid Other | Admitting: Adult Health

## 2019-12-15 ENCOUNTER — Encounter: Payer: Self-pay | Admitting: Adult Health

## 2019-12-15 ENCOUNTER — Other Ambulatory Visit: Payer: Self-pay

## 2019-12-15 ENCOUNTER — Ambulatory Visit (INDEPENDENT_AMBULATORY_CARE_PROVIDER_SITE_OTHER): Payer: Medicare Other | Admitting: Adult Health

## 2019-12-15 DIAGNOSIS — J9611 Chronic respiratory failure with hypoxia: Secondary | ICD-10-CM | POA: Diagnosis not present

## 2019-12-15 DIAGNOSIS — Z9989 Dependence on other enabling machines and devices: Secondary | ICD-10-CM

## 2019-12-15 DIAGNOSIS — F17210 Nicotine dependence, cigarettes, uncomplicated: Secondary | ICD-10-CM

## 2019-12-15 DIAGNOSIS — G4733 Obstructive sleep apnea (adult) (pediatric): Secondary | ICD-10-CM | POA: Diagnosis not present

## 2019-12-15 DIAGNOSIS — J449 Chronic obstructive pulmonary disease, unspecified: Secondary | ICD-10-CM

## 2019-12-15 MED ORDER — DOXYCYCLINE HYCLATE 100 MG PO TABS: 100.0000 mg | ORAL_TABLET | Freq: Two times a day (BID) | ORAL | 0 refills | Status: DC

## 2019-12-15 MED ORDER — PREDNISONE 10 MG PO TABS: ORAL_TABLET | ORAL | 0 refills | Status: DC

## 2019-12-15 NOTE — Assessment & Plan Note (Signed)
Smoking cessation encouraged!

## 2019-12-15 NOTE — Progress Notes (Signed)
@Patient  ID: , female    DOB: 12/20/1960, 59 y.o.   MRN: [id number]  Chief Complaint  Patient presents with  . Follow-up    COPD     Referring provider: [id number], MD  HPI: 59 yo female   smoker morbidly obese followed for COPD and OSA on CPAP  And Chronic Respiratory Failure on O2 2l/m  PMH DM , chronic pain .   TEST/EVENTS :  PFT 07/29/06 >> FEV1 1.75 (73%), FEV1% 60, TLC 4.81 (105%), DLCO 55%, + BD  HST 12/08/14 >> AHI 58.6, SaO2 low 64%.  A1AT 03/25/15 >> 176, MM  Auto CPAP 10/02/15 to 10/31/15 >>used on 30 of 30 nights with average 7 hrs 19 min. Average AHI 1 with median CPAP 9 and 95 th percentile CPAP 13 cm H2O  PFT 11/04/14 >> FEV1 1.36 (57%), FEV1% 63, TLC 4.12 (89%), DLCO 93%, +BD  PFT may 2019 showed 8150%, ratio 58, FVC 67%, positive bronchodilator response, DLCO 78%.  February 2017 2D echo showed EF 60 to 65%  .12/15/2019 Follow up : COPD, OSA and O2 RF and Med Review.  Patient returns for a 67-month follow-up.  Patient says overall her breathing has not been doing as good over the last week or 2.  She has had increased cough congestion and wheezing.  Unfortunately patient has restarted smoking.  Smoking cessation was discussed.  She remains on BREZTRI twice daily.  Denies any increased albuterol use.  She denies any hemoptysis chest pain orthopnea PND.  Says appetite is good with no nausea vomiting.  She has have sleep apnea is on nocturnal CPAP.  She uses oxygen at 2 L.  Does use oxygen 3 L at bedtime with her CPAP.  We reviewed all her medications and organize them into a medication count with patient education.  She does appear to be taking her medications correctly.   Allergies  Allergen Reactions  . Metolazone Other (See Comments)    Metabolic mood swings    Immunization History  Administered Date(s) Administered  . Influenza Whole 04/01/2002  . Influenza,inj,Quad PF,6+ Mos 03/21/2015, 07/27/2018, 02/08/2019  .  Influenza-Unspecified 03/01/2014, 04/22/2016, 03/25/2017  . Pneumococcal Polysaccharide-23 04/01/2002, 07/24/2015  . Td 06/01/1993  . Tdap 02/24/2017    Past Medical History:  Diagnosis Date  . Acute pancreatitis   . Asthma    main dyspnea thought due to deconditioning (10/2013)  . Bipolar I disorder, most recent episode (or current) manic, unspecified    pt denies this  . Bronchitis, chronic obstructive (HCC) 2008 & 2009   FeV1 64% TLC 105% DLCO 55% 2008  -  FeV1 81% FeF 25-75 43% 2009  . COPD (chronic obstructive pulmonary disease) (HCC)    emphysema, not an issue per pulm (10/2013)  . History of pyelonephritis 05/2012  . HTN (hypertension)   . Hx of migraines   . Knee pain, right   . Leukocytosis, unspecified   . Lumbar disc disease with radiculopathy    multilevel spondylitic changes, scoliosis, and anterolisthesis L4 not thought to currently be surg candidate (Dr. 06/2012, [facility]) (Dr. Phoebe Perch, [place])  . Nicotine addiction   . Nonspecific abnormal electrocardiogram (ECG) (EKG)   . Obesity   . Pure hyperglyceridemia   . Suicide attempt (HCC) 06/2001  . Swelling of limb   . Urge and stress incontinence 07/2012   (MacDiarmid)    Tobacco History: Social History   Tobacco Use  Smoking Status Current Some Day Smoker  . Years: 41.00  .  Types: Cigarettes  Smokeless Tobacco Never Used  Tobacco Comment   3 cigs per day 05/29/19 lmr   Ready to quit: Yes Counseling given: Yes Comment: 3 cigs per day 05/29/19 lmr   Outpatient Medications Prior to Visit  Medication Sig Dispense Refill  . albuterol (PROVENTIL) (2.5 MG/3ML) 0.083% nebulizer solution INHALE 1 VIAL VIA NEBULIZER EVERY 6 HOURS AS NEEDED FOR WHEEZING/SHORTNESS OF BREATH 150 mL 1  . benzonatate (TESSALON) 200 MG capsule Take 1 capsule (200 mg total) by mouth 3 (three) times daily as needed for cough. Swallow whole, to not bite pill 45 capsule 2  . Budeson-Glycopyrrol-Formoterol (BREZTRI AEROSPHERE) 160-9-4.8 MCG/ACT  AERO Inhale 2 puffs into the lungs 2 (two) times daily. 10.7 g 11  . dextromethorphan-guaiFENesin (MUCINEX DM) 30-600 MG 12hr tablet Take 1 tablet by mouth 2 (two) times daily as needed for cough.    . escitalopram (LEXAPRO) 20 MG tablet TAKE 1 AND 1/2 TABLETS DAILY BY MOUTH 45 tablet 6  . fluticasone (FLONASE) 50 MCG/ACT nasal spray Place 1 spray into both nostrils 2 (two) times daily. 16 g 11  . furosemide (LASIX) 20 MG tablet TAKE 2 TABLETS BY MOUTH EVERY DAY 60 tablet 2  . gabapentin (NEURONTIN) 300 MG capsule TAKE 2 TABLETS IN THE MORNING AND 3 TABLETS AT NIGHT 450 capsule 1  . LORazepam (ATIVAN) 0.5 MG tablet TAKE 1/2 TO 1 TABLET BY MOUTH TWICE A DAY AS NEEDED FOR ANXIETY 30 tablet 0  . metFORMIN (GLUCOPHAGE) 500 MG tablet TAKE 1 TABLET BY MOUTH EVERYDAY AT BEDTIME 90 tablet 3  . Multiple Vitamin (MULTIVITAMIN WITH MINERALS) TABS tablet Take 1 tablet by mouth daily.    Marland Kitchen nystatin (MYCOSTATIN) 100000 UNIT/ML suspension TAKE 1 TEASPOONFUL BY MOUTH 3 TIMES DAILY AS NEEDED FOR MOUTH SORES. 150 mL 1  . oxyCODONE-acetaminophen (PERCOCET/ROXICET) 5-325 MG tablet Take 1 tablet by mouth every 8 (eight) hours as needed for severe pain. 21 tablet 0  . OXYGEN 2 lpm with sleep with CPAP and during the day with exertion    . pantoprazole (PROTONIX) 40 MG tablet TAKE 1 TABLET BY MOUTH EVERY DAY 30 TO 60 MINUTES BEFORE THE FIRST MEAL OF THE DAY 90 tablet 1  . PROAIR RESPICLICK 108 (90 Base) MCG/ACT AEPB INHALE 2 PUFFS INTO THE LUNGS EVERY 6 HOURS AS NEEDED FOR DYSPNEA/WHEEZE 2 each 3  . promethazine-dextromethorphan (PROMETHAZINE-DM) 6.25-15 MG/5ML syrup Take 5 mLs by mouth 4 (four) times daily as needed for cough. 240 mL 0  . spironolactone (ALDACTONE) 25 MG tablet TAKE 1 TABLET BY MOUTH TWICE A DAY 60 tablet 2  . atorvastatin (LIPITOR) 20 MG tablet TAKE 1 TABLET (20 MG TOTAL) BY MOUTH DAILY AT 6 PM. 30 tablet 11  . famotidine (PEPCID) 20 MG tablet TAKE 1 TABLET BY MOUTH EVERYDAY AT BEDTIME 90 tablet 1    No facility-administered medications prior to visit.     Review of Systems:   Constitutional:   No  weight loss, night sweats,  Fevers, chills, + fatigue, or  lassitude.  HEENT:   No headaches,  Difficulty swallowing,  Tooth/dental problems, or  Sore throat,                No sneezing, itching, ear ache, nasal congestion, post nasal drip,   CV:  No chest pain,  Orthopnea, PND, swelling in lower extremities, anasarca, dizziness, palpitations, syncope.   GI  No heartburn, indigestion, abdominal pain, nausea, vomiting, diarrhea, change in bowel habits, loss of appetite, bloody stools.  Resp:  .  No chest wall deformity  Skin: no rash or lesions.  GU: no dysuria, change in color of urine, no urgency or frequency.  No flank pain, no hematuria   MS:  No joint pain or swelling.  No decreased range of motion.  No back pain.    Physical Exam  LMP 03/08/2013   GEN: A/Ox3; pleasant , NAD    HEENT:  [place]/AT,    NOSE-clear, THROAT-clear, no lesions, no postnasal drip or exudate noted.   NECK:  Supple w/ fair ROM; no JVD; normal carotid impulses w/o bruits; no thyromegaly or nodules palpated; no lymphadenopathy.    RESP scattered rhonchi bilaterally, speaks in full sentences  . no accessory muscle use, no dullness to percussion  CARD:  RRR, no m/r/g, no peripheral edema, pulses intact, no cyanosis or clubbing.  GI:   Soft & nt; nml bowel sounds; no organomegaly or masses detected.   Musco: Warm bil, no deformities or joint swelling noted.   Neuro: alert, no focal deficits noted.    Skin: Warm, no lesions or rashes    Lab Results:    Imaging: No results found.    PFT Results Latest Ref Rng & Units 10/20/2017 11/04/2015  FVC-Pre L 1.64 1.91  FVC-Predicted Pre % 55 63  FVC-Post L 2.00 2.17  FVC-Predicted Post % 67 71  Pre FEV1/FVC % % 62 57  Post FEV1/FCV % % 58 63  FEV1-Pre L 1.02 1.09  FEV1-Predicted Pre % 44 46  FEV1-Post L 1.16 1.36  DLCO UNC% % 78 93  DLCO  COR %Predicted % 94 101  TLC L 5.08 4.12  TLC % Predicted % 110 89  RV % Predicted % 148 82    No results found for: NITRICOXIDE      Assessment & Plan:   COPD  GOLD II/III still smoking/ 02 dep  Mild exacerbation.  Smoking cessation discussed. Patient's medications were reviewed today and patient education was given. Computerized medication calendar was adjusted/completed    Plan  Patient Instructions  Doxycycline 100mg  Twice daily  For 1 week , take with food.  Prednisone taper over next week. Call if blood sugar >250 .  Mucinex DM Twice daily  As needed  Cough /congestion  Use oxygen 2l/m  And 3l/m At bedtime  With CPAP .  Wear CPAP  At bedtime and with naps. Try to get in at least 4hr . Work on not smoking . Set a quit date.  Follow  Med calendar closely and bring to each visit.  Follow up with with Dr.  In 3 Months  and As needed   Please contact office for sooner follow up if symptoms do not improve or worsen or seek emergency care  Covid vaccine when well.          Chronic respiratory failure with hypoxia (HCC) Continue on oxygen 2 L at rest and 3 L with bedtime with CPAP  OSA on CPAP Doing well on nocturnal CPAP and oxygen.  No changes  Cigarette smoker Smoking cessation encouraged     Sherene Sires, NP 12/15/2019

## 2019-12-15 NOTE — Assessment & Plan Note (Signed)
Continue on oxygen 2 L at rest and 3 L with bedtime with CPAP

## 2019-12-15 NOTE — Patient Instructions (Addendum)
Doxycycline 100mg  Twice daily  For 1 week , take with food.  Prednisone taper over next week. Call if blood sugar >250 .  Mucinex DM Twice daily  As needed  Cough /congestion  Use oxygen 2l/m  And 3l/m At bedtime  With CPAP .  Wear CPAP  At bedtime and with naps. Try to get in at least 4hr . Work on not smoking . Set a quit date.  Follow  Med calendar closely and bring to each visit.  Follow up with with Dr.  In 3 Months  and As needed   Please contact office for sooner follow up if symptoms do not improve or worsen or seek emergency care  Covid vaccine when well.

## 2019-12-15 NOTE — Assessment & Plan Note (Signed)
Doing well on nocturnal CPAP and oxygen.  No changes

## 2019-12-15 NOTE — Assessment & Plan Note (Signed)
Mild exacerbation.  Smoking cessation discussed. Patient's medications were reviewed today and patient education was given. Computerized medication calendar was adjusted/completed    Plan  Patient Instructions  Doxycycline 100mg  Twice daily  For 1 week , take with food.  Prednisone taper over next week. Call if blood sugar >250 .  Mucinex DM Twice daily  As needed  Cough /congestion  Use oxygen 2l/m  And 3l/m At bedtime  With CPAP .  Wear CPAP  At bedtime and with naps. Try to get in at least 4hr . Work on not smoking . Set a quit date.  Follow  Med calendar closely and bring to each visit.  Follow up with with Dr.  In 3 Months  and As needed   Please contact office for sooner follow up if symptoms do not improve or worsen or seek emergency care  Covid vaccine when well.

## 2020-01-04 ENCOUNTER — Other Ambulatory Visit: Payer: Self-pay | Admitting: Family Medicine

## 2020-01-10 DIAGNOSIS — G4733 Obstructive sleep apnea (adult) (pediatric): Secondary | ICD-10-CM | POA: Diagnosis not present

## 2020-01-16 ENCOUNTER — Other Ambulatory Visit: Payer: Self-pay | Admitting: Family Medicine

## 2020-01-16 ENCOUNTER — Other Ambulatory Visit: Payer: Self-pay | Admitting: Internal Medicine

## 2020-01-17 NOTE — Telephone Encounter (Signed)
Name of Medication: Lorazepam Name of Pharmacy: [facility] Last Fill or Written Date and Quantity: 11/17/19, #30 Last Office Visit and Type: 08/17/19, AWV Next Office Visit and Type: none Last Controlled Substance Agreement Date: none Last UDS: none

## 2020-01-17 NOTE — Telephone Encounter (Signed)
ERx 

## 2020-01-30 ENCOUNTER — Inpatient Hospital Stay (HOSPITAL_COMMUNITY)
Admission: EM | Admit: 2020-01-30 | Discharge: 2020-02-04 | DRG: 190 | Disposition: A | Discharge status: Home / Self Care | Payer: Medicare Other | Attending: Family Medicine | Admitting: Family Medicine

## 2020-01-30 ENCOUNTER — Inpatient Hospital Stay (HOSPITAL_COMMUNITY): Payer: Medicare Other

## 2020-01-30 ENCOUNTER — Encounter (HOSPITAL_COMMUNITY): Payer: Self-pay | Admitting: Emergency Medicine

## 2020-01-30 ENCOUNTER — Emergency Department (HOSPITAL_COMMUNITY): Payer: Medicare Other

## 2020-01-30 ENCOUNTER — Other Ambulatory Visit: Payer: Self-pay

## 2020-01-30 DIAGNOSIS — R000 Tachycardia, unspecified: Secondary | ICD-10-CM | POA: Diagnosis not present

## 2020-01-30 DIAGNOSIS — R7989 Other specified abnormal findings of blood chemistry: Secondary | ICD-10-CM | POA: Diagnosis not present

## 2020-01-30 DIAGNOSIS — G8929 Other chronic pain: Secondary | ICD-10-CM | POA: Diagnosis present

## 2020-01-30 DIAGNOSIS — E1122 Type 2 diabetes mellitus with diabetic chronic kidney disease: Secondary | ICD-10-CM | POA: Diagnosis present

## 2020-01-30 DIAGNOSIS — Z9989 Dependence on other enabling machines and devices: Secondary | ICD-10-CM | POA: Diagnosis not present

## 2020-01-30 DIAGNOSIS — Z825 Family history of asthma and other chronic lower respiratory diseases: Secondary | ICD-10-CM

## 2020-01-30 DIAGNOSIS — E872 Acidosis: Secondary | ICD-10-CM | POA: Diagnosis not present

## 2020-01-30 DIAGNOSIS — I252 Old myocardial infarction: Secondary | ICD-10-CM

## 2020-01-30 DIAGNOSIS — J9622 Acute and chronic respiratory failure with hypercapnia: Secondary | ICD-10-CM | POA: Diagnosis present

## 2020-01-30 DIAGNOSIS — R069 Unspecified abnormalities of breathing: Secondary | ICD-10-CM | POA: Diagnosis not present

## 2020-01-30 DIAGNOSIS — I471 Supraventricular tachycardia: Secondary | ICD-10-CM | POA: Diagnosis present

## 2020-01-30 DIAGNOSIS — G4733 Obstructive sleep apnea (adult) (pediatric): Secondary | ICD-10-CM | POA: Diagnosis not present

## 2020-01-30 DIAGNOSIS — R0602 Shortness of breath: Secondary | ICD-10-CM | POA: Diagnosis not present

## 2020-01-30 DIAGNOSIS — J121 Respiratory syncytial virus pneumonia: Secondary | ICD-10-CM | POA: Diagnosis present

## 2020-01-30 DIAGNOSIS — J690 Pneumonitis due to inhalation of food and vomit: Secondary | ICD-10-CM | POA: Diagnosis not present

## 2020-01-30 DIAGNOSIS — F319 Bipolar disorder, unspecified: Secondary | ICD-10-CM | POA: Diagnosis present

## 2020-01-30 DIAGNOSIS — Z6841 Body Mass Index (BMI) 40.0 and over, adult: Secondary | ICD-10-CM | POA: Diagnosis not present

## 2020-01-30 DIAGNOSIS — E781 Pure hyperglyceridemia: Secondary | ICD-10-CM | POA: Diagnosis not present

## 2020-01-30 DIAGNOSIS — N1831 Chronic kidney disease, stage 3a: Secondary | ICD-10-CM | POA: Diagnosis present

## 2020-01-30 DIAGNOSIS — F419 Anxiety disorder, unspecified: Secondary | ICD-10-CM | POA: Diagnosis present

## 2020-01-30 DIAGNOSIS — I4510 Unspecified right bundle-branch block: Secondary | ICD-10-CM | POA: Diagnosis present

## 2020-01-30 DIAGNOSIS — J9601 Acute respiratory failure with hypoxia: Secondary | ICD-10-CM | POA: Diagnosis not present

## 2020-01-30 DIAGNOSIS — Z803 Family history of malignant neoplasm of breast: Secondary | ICD-10-CM

## 2020-01-30 DIAGNOSIS — I248 Other forms of acute ischemic heart disease: Secondary | ICD-10-CM | POA: Diagnosis not present

## 2020-01-30 DIAGNOSIS — J441 Chronic obstructive pulmonary disease with (acute) exacerbation: Secondary | ICD-10-CM | POA: Diagnosis not present

## 2020-01-30 DIAGNOSIS — J9602 Acute respiratory failure with hypercapnia: Secondary | ICD-10-CM | POA: Diagnosis not present

## 2020-01-30 DIAGNOSIS — J9611 Chronic respiratory failure with hypoxia: Secondary | ICD-10-CM | POA: Diagnosis present

## 2020-01-30 DIAGNOSIS — J9621 Acute and chronic respiratory failure with hypoxia: Secondary | ICD-10-CM | POA: Diagnosis present

## 2020-01-30 DIAGNOSIS — R1310 Dysphagia, unspecified: Secondary | ICD-10-CM

## 2020-01-30 DIAGNOSIS — Z83438 Family history of other disorder of lipoprotein metabolism and other lipidemia: Secondary | ICD-10-CM

## 2020-01-30 DIAGNOSIS — Z7951 Long term (current) use of inhaled steroids: Secondary | ICD-10-CM

## 2020-01-30 DIAGNOSIS — I130 Hypertensive heart and chronic kidney disease with heart failure and stage 1 through stage 4 chronic kidney disease, or unspecified chronic kidney disease: Secondary | ICD-10-CM | POA: Diagnosis not present

## 2020-01-30 DIAGNOSIS — F17210 Nicotine dependence, cigarettes, uncomplicated: Secondary | ICD-10-CM | POA: Diagnosis not present

## 2020-01-30 DIAGNOSIS — J449 Chronic obstructive pulmonary disease, unspecified: Secondary | ICD-10-CM | POA: Diagnosis not present

## 2020-01-30 DIAGNOSIS — E1142 Type 2 diabetes mellitus with diabetic polyneuropathy: Secondary | ICD-10-CM | POA: Diagnosis present

## 2020-01-30 DIAGNOSIS — R778 Other specified abnormalities of plasma proteins: Secondary | ICD-10-CM | POA: Diagnosis not present

## 2020-01-30 DIAGNOSIS — E1165 Type 2 diabetes mellitus with hyperglycemia: Secondary | ICD-10-CM | POA: Diagnosis present

## 2020-01-30 DIAGNOSIS — Z9981 Dependence on supplemental oxygen: Secondary | ICD-10-CM | POA: Diagnosis not present

## 2020-01-30 DIAGNOSIS — G9341 Metabolic encephalopathy: Secondary | ICD-10-CM | POA: Diagnosis present

## 2020-01-30 DIAGNOSIS — Z888 Allergy status to other drugs, medicaments and biological substances status: Secondary | ICD-10-CM

## 2020-01-30 DIAGNOSIS — I499 Cardiac arrhythmia, unspecified: Secondary | ICD-10-CM | POA: Diagnosis not present

## 2020-01-30 DIAGNOSIS — Z743 Need for continuous supervision: Secondary | ICD-10-CM | POA: Diagnosis not present

## 2020-01-30 DIAGNOSIS — I2510 Atherosclerotic heart disease of native coronary artery without angina pectoris: Secondary | ICD-10-CM | POA: Diagnosis present

## 2020-01-30 DIAGNOSIS — G43909 Migraine, unspecified, not intractable, without status migrainosus: Secondary | ICD-10-CM | POA: Diagnosis not present

## 2020-01-30 DIAGNOSIS — R945 Abnormal results of liver function studies: Secondary | ICD-10-CM | POA: Diagnosis not present

## 2020-01-30 DIAGNOSIS — K760 Fatty (change of) liver, not elsewhere classified: Secondary | ICD-10-CM | POA: Diagnosis present

## 2020-01-30 DIAGNOSIS — N179 Acute kidney failure, unspecified: Secondary | ICD-10-CM | POA: Diagnosis present

## 2020-01-30 DIAGNOSIS — M5116 Intervertebral disc disorders with radiculopathy, lumbar region: Secondary | ICD-10-CM | POA: Diagnosis not present

## 2020-01-30 DIAGNOSIS — I214 Non-ST elevation (NSTEMI) myocardial infarction: Secondary | ICD-10-CM

## 2020-01-30 DIAGNOSIS — I5041 Acute combined systolic (congestive) and diastolic (congestive) heart failure: Secondary | ICD-10-CM | POA: Diagnosis present

## 2020-01-30 DIAGNOSIS — E1169 Type 2 diabetes mellitus with other specified complication: Secondary | ICD-10-CM | POA: Diagnosis present

## 2020-01-30 DIAGNOSIS — J181 Lobar pneumonia, unspecified organism: Secondary | ICD-10-CM | POA: Diagnosis not present

## 2020-01-30 DIAGNOSIS — Z8249 Family history of ischemic heart disease and other diseases of the circulatory system: Secondary | ICD-10-CM

## 2020-01-30 DIAGNOSIS — Z7984 Long term (current) use of oral hypoglycemic drugs: Secondary | ICD-10-CM

## 2020-01-30 DIAGNOSIS — Z20822 Contact with and (suspected) exposure to covid-19: Secondary | ICD-10-CM | POA: Diagnosis present

## 2020-01-30 DIAGNOSIS — I2781 Cor pulmonale (chronic): Secondary | ICD-10-CM | POA: Diagnosis present

## 2020-01-30 DIAGNOSIS — I959 Hypotension, unspecified: Secondary | ICD-10-CM | POA: Diagnosis present

## 2020-01-30 DIAGNOSIS — E1140 Type 2 diabetes mellitus with diabetic neuropathy, unspecified: Secondary | ICD-10-CM | POA: Diagnosis present

## 2020-01-30 DIAGNOSIS — Z79899 Other long term (current) drug therapy: Secondary | ICD-10-CM

## 2020-01-30 DIAGNOSIS — I5020 Unspecified systolic (congestive) heart failure: Secondary | ICD-10-CM | POA: Diagnosis not present

## 2020-01-30 DIAGNOSIS — J440 Chronic obstructive pulmonary disease with acute lower respiratory infection: Secondary | ICD-10-CM | POA: Diagnosis not present

## 2020-01-30 DIAGNOSIS — R0689 Other abnormalities of breathing: Secondary | ICD-10-CM | POA: Diagnosis not present

## 2020-01-30 DIAGNOSIS — E119 Type 2 diabetes mellitus without complications: Secondary | ICD-10-CM | POA: Diagnosis present

## 2020-01-30 DIAGNOSIS — I517 Cardiomegaly: Secondary | ICD-10-CM | POA: Diagnosis not present

## 2020-01-30 LAB — CBC WITH DIFFERENTIAL/PLATELET
Abs Immature Granulocytes: 0.07 10*3/uL (ref 0.00–0.07)
Basophils Absolute: 0.1 10*3/uL (ref 0.0–0.1)
Basophils Relative: 1 %
Eosinophils Absolute: 0.3 10*3/uL (ref 0.0–0.5)
Eosinophils Relative: 2 %
HCT: 47.6 % — ABNORMAL HIGH (ref 36.0–46.0)
Hemoglobin: 14.3 g/dL (ref 12.0–15.0)
Immature Granulocytes: 1 %
Lymphocytes Relative: 44 %
Lymphs Abs: 6.8 10*3/uL — ABNORMAL HIGH (ref 0.7–4.0)
MCH: 29.6 pg (ref 26.0–34.0)
MCHC: 30 g/dL (ref 30.0–36.0)
MCV: 98.6 fL (ref 80.0–100.0)
Monocytes Absolute: 1.1 10*3/uL — ABNORMAL HIGH (ref 0.1–1.0)
Monocytes Relative: 7 %
Neutro Abs: 6.9 10*3/uL (ref 1.7–7.7)
Neutrophils Relative %: 45 %
Platelets: 299 10*3/uL (ref 150–400)
RBC: 4.83 MIL/uL (ref 3.87–5.11)
RDW: 14 % (ref 11.5–15.5)
WBC: 15.2 10*3/uL — ABNORMAL HIGH (ref 4.0–10.5)
nRBC: 0 % (ref 0.0–0.2)

## 2020-01-30 LAB — I-STAT VENOUS BLOOD GAS, ED
Acid-base deficit: 6 mmol/L — ABNORMAL HIGH (ref 0.0–2.0)
Acid-base deficit: 3 mmol/L — ABNORMAL HIGH (ref 0.0–2.0)
Bicarbonate: 24.4 mmol/L (ref 20.0–28.0)
Bicarbonate: 22.3 mmol/L (ref 20.0–28.0)
Calcium, Ion: 1.12 mmol/L — ABNORMAL LOW (ref 1.15–1.40)
Calcium, Ion: 1.02 mmol/L — ABNORMAL LOW (ref 1.15–1.40)
HCT: 46 % (ref 36.0–46.0)
HCT: 47 % — ABNORMAL HIGH (ref 36.0–46.0)
Hemoglobin: 15.6 g/dL — ABNORMAL HIGH (ref 12.0–15.0)
Hemoglobin: 16 g/dL — ABNORMAL HIGH (ref 12.0–15.0)
O2 Saturation: 36 %
O2 Saturation: 63 %
Potassium: 4.7 mmol/L (ref 3.5–5.1)
Potassium: 4.7 mmol/L (ref 3.5–5.1)
Sodium: 137 mmol/L (ref 135–145)
Sodium: 137 mmol/L (ref 135–145)
TCO2: 26 mmol/L (ref 22–32)
TCO2: 24 mmol/L (ref 22–32)
pCO2, Ven: 70.7 mmHg (ref 44.0–60.0)
pCO2, Ven: 39.6 mmHg — ABNORMAL LOW (ref 44.0–60.0)
pH, Ven: 7.146 — CL (ref 7.250–7.430)
pH, Ven: 7.359 (ref 7.250–7.430)
pO2, Ven: 28 mmHg — CL (ref 32.0–45.0)
pO2, Ven: 34 mmHg (ref 32.0–45.0)

## 2020-01-30 LAB — BASIC METABOLIC PANEL
Anion gap: 14 (ref 5–15)
BUN: 17 mg/dL (ref 8–23)
CO2: 24 mmol/L (ref 22–32)
Calcium: 9.1 mg/dL (ref 8.9–10.3)
Chloride: 98 mmol/L (ref 98–111)
Creatinine, Ser: 1.66 mg/dL — ABNORMAL HIGH (ref 0.44–1.00)
GFR calc Af Amer: 38 mL/min — ABNORMAL LOW (ref >60)
GFR calc non Af Amer: 33 mL/min — ABNORMAL LOW (ref >60)
Glucose, Bld: 455 mg/dL — ABNORMAL HIGH (ref 70–99)
Potassium: 4.9 mmol/L (ref 3.5–5.1)
Sodium: 136 mmol/L (ref 135–145)

## 2020-01-30 LAB — HEMOGLOBIN A1C
Hgb A1c MFr Bld: 6.2 % — ABNORMAL HIGH (ref 4.8–5.6)
Mean Plasma Glucose: 131.24 mg/dL

## 2020-01-30 LAB — SARS CORONAVIRUS 2 BY RT PCR (HOSPITAL ORDER, PERFORMED IN ~~LOC~~ HOSPITAL LAB): SARS Coronavirus 2: NEGATIVE

## 2020-01-30 LAB — HEPATIC FUNCTION PANEL
ALT: 208 U/L — ABNORMAL HIGH (ref 0–44)
AST: 193 U/L — ABNORMAL HIGH (ref 15–41)
Albumin: 3.4 g/dL — ABNORMAL LOW (ref 3.5–5.0)
Alkaline Phosphatase: 126 U/L (ref 38–126)
Bilirubin, Direct: 0.2 mg/dL (ref 0.0–0.2)
Indirect Bilirubin: 0.2 mg/dL — ABNORMAL LOW (ref 0.3–0.9)
Total Bilirubin: 0.4 mg/dL (ref 0.3–1.2)
Total Protein: 7.3 g/dL (ref 6.5–8.1)

## 2020-01-30 LAB — TROPONIN I (HIGH SENSITIVITY)
Troponin I (High Sensitivity): 336 ng/L (ref <18)
Troponin I (High Sensitivity): 1179 ng/L (ref <18)
Troponin I (High Sensitivity): 1655 ng/L (ref <18)

## 2020-01-30 LAB — GLUCOSE, CAPILLARY
Glucose-Capillary: 191 mg/dL — ABNORMAL HIGH (ref 70–99)
Glucose-Capillary: 167 mg/dL — ABNORMAL HIGH (ref 70–99)

## 2020-01-30 LAB — BRAIN NATRIURETIC PEPTIDE: B Natriuretic Peptide: 47.7 pg/mL (ref 0.0–100.0)

## 2020-01-30 LAB — HIV ANTIBODY (ROUTINE TESTING W REFLEX): HIV Screen 4th Generation wRfx: NONREACTIVE

## 2020-01-30 LAB — HEPARIN LEVEL (UNFRACTIONATED): Heparin Unfractionated: 0.46 IU/mL (ref 0.30–0.70)

## 2020-01-30 LAB — CK: Total CK: 199 U/L (ref 38–234)

## 2020-01-30 MED ORDER — ONDANSETRON HCL 4 MG/2ML IJ SOLN
4.0000 mg | Freq: Once | INTRAMUSCULAR | Status: DC
  Filled 2020-01-30: qty 2

## 2020-01-30 MED ORDER — GABAPENTIN 300 MG PO CAPS: 400.0000 mg | ORAL_CAPSULE | Freq: Two times a day (BID) | ORAL | Status: DC

## 2020-01-30 MED ORDER — FUROSEMIDE 20 MG PO TABS: 40.0000 mg | ORAL_TABLET | Freq: Every day | ORAL | Status: DC

## 2020-01-30 MED ORDER — IPRATROPIUM BROMIDE 0.02 % IN SOLN
0.5000 mg | Freq: Four times a day (QID) | RESPIRATORY_TRACT | Status: DC
  Administered 2020-01-30 – 2020-01-31 (×4): 0.5 mg via RESPIRATORY_TRACT
  Filled 2020-01-30 (×4): qty 2.5

## 2020-01-30 MED ORDER — ALBUTEROL (5 MG/ML) CONTINUOUS INHALATION SOLN
10.0000 mg/h | INHALATION_SOLUTION | Freq: Once | RESPIRATORY_TRACT | Status: AC
  Administered 2020-01-30: 10 mg/h via RESPIRATORY_TRACT
  Filled 2020-01-30: qty 20

## 2020-01-30 MED ORDER — ADULT MULTIVITAMIN W/MINERALS CH
1.0000 | ORAL_TABLET | Freq: Every day | ORAL | Status: DC
  Administered 2020-01-31 – 2020-02-04 (×5): 1 via ORAL
  Filled 2020-01-30 (×5): qty 1

## 2020-01-30 MED ORDER — SODIUM CHLORIDE 0.9 % IV SOLN
1.0000 g | INTRAVENOUS | Status: DC
  Administered 2020-01-30 – 2020-02-03 (×5): 1 g via INTRAVENOUS
  Filled 2020-01-30 (×5): qty 10

## 2020-01-30 MED ORDER — BENZONATATE 100 MG PO CAPS: 200.0000 mg | ORAL_CAPSULE | Freq: Three times a day (TID) | ORAL | Status: DC | PRN

## 2020-01-30 MED ORDER — INSULIN ASPART 100 UNIT/ML ~~LOC~~ SOLN: 0.0000 [IU] | Freq: Every day | SUBCUTANEOUS | Status: DC

## 2020-01-30 MED ORDER — DM-GUAIFENESIN ER 30-600 MG PO TB12: 1.0000 | ORAL_TABLET | Freq: Two times a day (BID) | ORAL | Status: DC | PRN

## 2020-01-30 MED ORDER — ASPIRIN EC 81 MG PO TBEC
81.0000 mg | DELAYED_RELEASE_TABLET | Freq: Every day | ORAL | Status: DC
  Administered 2020-01-31 – 2020-02-04 (×5): 81 mg via ORAL
  Filled 2020-01-30 (×5): qty 1

## 2020-01-30 MED ORDER — INSULIN ASPART 100 UNIT/ML ~~LOC~~ SOLN
5.0000 [IU] | Freq: Three times a day (TID) | SUBCUTANEOUS | Status: DC
  Administered 2020-01-30 – 2020-02-04 (×11): 5 [IU] via SUBCUTANEOUS

## 2020-01-30 MED ORDER — PREDNISONE 20 MG PO TABS
40.0000 mg | ORAL_TABLET | Freq: Every day | ORAL | Status: AC
  Administered 2020-01-31 – 2020-02-04 (×5): 40 mg via ORAL
  Filled 2020-01-30 (×6): qty 2

## 2020-01-30 MED ORDER — BUDESON-GLYCOPYRROL-FORMOTEROL 160-9-4.8 MCG/ACT IN AERO: 2.0000 | INHALATION_SPRAY | Freq: Two times a day (BID) | RESPIRATORY_TRACT | Status: DC

## 2020-01-30 MED ORDER — FLUTICASONE PROPIONATE 50 MCG/ACT NA SUSP
1.0000 | Freq: Two times a day (BID) | NASAL | Status: DC
  Administered 2020-01-30 – 2020-02-04 (×9): 1 via NASAL
  Filled 2020-01-30 (×2): qty 16

## 2020-01-30 MED ORDER — GABAPENTIN 300 MG PO CAPS
600.0000 mg | ORAL_CAPSULE | Freq: Every day | ORAL | Status: DC
  Administered 2020-01-30 – 2020-02-04 (×6): 600 mg via ORAL
  Filled 2020-01-30 (×6): qty 2

## 2020-01-30 MED ORDER — ONDANSETRON HCL 4 MG/2ML IJ SOLN
4.0000 mg | Freq: Once | INTRAMUSCULAR | Status: AC
  Administered 2020-01-30: 4 mg via INTRAVENOUS
  Filled 2020-01-30: qty 2

## 2020-01-30 MED ORDER — ESCITALOPRAM OXALATE 10 MG PO TABS
10.0000 mg | ORAL_TABLET | Freq: Every day | ORAL | Status: DC
  Administered 2020-01-30: 10 mg via ORAL
  Filled 2020-01-30: qty 1

## 2020-01-30 MED ORDER — ONDANSETRON HCL 4 MG/2ML IJ SOLN: 4.0000 mg | Freq: Once | INTRAMUSCULAR | Status: DC

## 2020-01-30 MED ORDER — INSULIN ASPART 100 UNIT/ML ~~LOC~~ SOLN
0.0000 [IU] | Freq: Three times a day (TID) | SUBCUTANEOUS | Status: DC
  Administered 2020-01-30: 2 [IU] via SUBCUTANEOUS
  Administered 2020-01-31: 1 [IU] via SUBCUTANEOUS
  Administered 2020-01-31: 2 [IU] via SUBCUTANEOUS
  Administered 2020-02-01 (×2): 1 [IU] via SUBCUTANEOUS
  Administered 2020-02-02: 2 [IU] via SUBCUTANEOUS
  Administered 2020-02-02 – 2020-02-03 (×2): 1 [IU] via SUBCUTANEOUS
  Administered 2020-02-03: 3 [IU] via SUBCUTANEOUS
  Administered 2020-02-04: 1 [IU] via SUBCUTANEOUS

## 2020-01-30 MED ORDER — PANTOPRAZOLE SODIUM 40 MG PO TBEC
40.0000 mg | DELAYED_RELEASE_TABLET | Freq: Every day | ORAL | Status: DC
  Administered 2020-01-31 – 2020-02-04 (×5): 40 mg via ORAL
  Filled 2020-01-30 (×5): qty 1

## 2020-01-30 MED ORDER — HEPARIN BOLUS VIA INFUSION
4000.0000 [IU] | Freq: Once | INTRAVENOUS | Status: AC
  Administered 2020-01-30: 4000 [IU] via INTRAVENOUS
  Filled 2020-01-30: qty 4000

## 2020-01-30 MED ORDER — METOPROLOL TARTRATE 5 MG/5ML IV SOLN: 2.5000 mg | Freq: Four times a day (QID) | INTRAVENOUS | Status: DC | PRN

## 2020-01-30 MED ORDER — ENOXAPARIN SODIUM 40 MG/0.4ML ~~LOC~~ SOLN: 40.0000 mg | SUBCUTANEOUS | Status: DC

## 2020-01-30 MED ORDER — LEVOFLOXACIN 500 MG PO TABS: 500.0000 mg | ORAL_TABLET | Freq: Every day | ORAL | Status: DC

## 2020-01-30 MED ORDER — GABAPENTIN 300 MG PO CAPS
900.0000 mg | ORAL_CAPSULE | Freq: Every day | ORAL | Status: DC
  Administered 2020-02-01 – 2020-02-03 (×3): 900 mg via ORAL
  Filled 2020-01-30 (×3): qty 3

## 2020-01-30 MED ORDER — NICOTINE 14 MG/24HR TD PT24
14.0000 mg | MEDICATED_PATCH | Freq: Every day | TRANSDERMAL | Status: DC
  Administered 2020-02-01 – 2020-02-04 (×4): 14 mg via TRANSDERMAL
  Filled 2020-01-30 (×5): qty 1

## 2020-01-30 MED ORDER — HEPARIN (PORCINE) 25000 UT/250ML-% IV SOLN
1300.0000 [IU]/h | INTRAVENOUS | Status: DC
  Administered 2020-01-30 – 2020-02-01 (×3): 1300 [IU]/h via INTRAVENOUS
  Filled 2020-01-30 (×3): qty 250

## 2020-01-30 MED ORDER — SODIUM CHLORIDE 0.9 % IV BOLUS
500.0000 mL | Freq: Once | INTRAVENOUS | Status: AC
  Administered 2020-01-30: 500 mL via INTRAVENOUS

## 2020-01-30 MED ORDER — SODIUM CHLORIDE 0.9 % IV BOLUS
250.0000 mL | Freq: Once | INTRAVENOUS | Status: AC
  Administered 2020-01-30: 250 mL via INTRAVENOUS

## 2020-01-30 MED ORDER — AZITHROMYCIN 250 MG PO TABS
500.0000 mg | ORAL_TABLET | Freq: Every day | ORAL | Status: DC
  Administered 2020-01-31 – 2020-02-04 (×5): 500 mg via ORAL
  Filled 2020-01-30 (×5): qty 2

## 2020-01-30 MED ORDER — SPIRONOLACTONE 25 MG PO TABS: 25.0000 mg | ORAL_TABLET | Freq: Two times a day (BID) | ORAL | Status: DC

## 2020-01-30 MED ORDER — PANTOPRAZOLE SODIUM 40 MG PO TBEC: 40.0000 mg | DELAYED_RELEASE_TABLET | Freq: Every day | ORAL | Status: DC

## 2020-01-30 MED ORDER — IPRATROPIUM BROMIDE 0.02 % IN SOLN: 0.5000 mg | Freq: Four times a day (QID) | RESPIRATORY_TRACT | Status: DC | PRN

## 2020-01-30 NOTE — ED Triage Notes (Signed)
Pt BIB [facility] from home. Pt complaint of shortness of breath. Pt with hx of COPD, pt short of breath @ home this morning. Attempted @ home nebulizer but that did not help. Pt received 10 mg albuterol, 0.5 atrovent, 2 g magnesium, and 125 mg solu-medrol via EMS. VSS.

## 2020-01-30 NOTE — ED Notes (Signed)
Radiology at bedside

## 2020-01-30 NOTE — Progress Notes (Signed)
ANTICOAGULATION CONSULT NOTE - Initial Consult  Pharmacy Consult for heparin Indication: ACS  Allergies  Allergen Reactions  . Metolazone Other (See Comments)    Metabolic mood swings    Patient Measurements: Heparin Dosing Weight: 98 kg Height 5\' 1"  (154.9 cm)  Vital Signs: BP: 122/94 (08/31 1515) Pulse Rate: 98 (08/31 1515)  Labs: Recent Labs    01/30/20 0955 01/30/20 0955 01/30/20 1008 01/30/20 1206 01/30/20 1207  HGB 14.3   < > 15.6*  --  16.0*  HCT 47.6*  --  46.0  --  47.0*  PLT 299  --   --   --   --   CREATININE 1.66*  --   --   --   --   TROPONINIHS 336*  --   --  1,179*  --    < > = values in this interval not displayed.    CrCl cannot be calculated (Unknown ideal weight.).   Medical History: Past Medical History:  Diagnosis Date  . Acute pancreatitis   . Asthma    main dyspnea thought due to deconditioning (10/2013)  . Bipolar I disorder, most recent episode (or current) manic, unspecified    pt denies this  . Bronchitis, chronic obstructive (HCC) 2008 & 2009   FeV1 64% TLC 105% DLCO 55% 2008  -  FeV1 81% FeF 25-75 43% 2009  . COPD (chronic obstructive pulmonary disease) (HCC)    emphysema, not an issue per pulm (10/2013)  . History of pyelonephritis 05/2012  . HTN (hypertension)   . Hx of migraines   . Knee pain, right   . Leukocytosis, unspecified   . Lumbar disc disease with radiculopathy    multilevel spondylitic changes, scoliosis, and anterolisthesis L4 not thought to currently be surg candidate (Dr. 06/2012, [facility]) (Dr. Phoebe Perch, [place])  . Nicotine addiction   . Nonspecific abnormal electrocardiogram (ECG) (EKG)   . Obesity   . Pure hyperglyceridemia   . Suicide attempt (HCC) 06/2001  . Swelling of limb   . Urge and stress incontinence 07/2012   (MacDiarmid)    Medications:  Scheduled:  . aspirin EC  81 mg Oral Daily  . azithromycin  500 mg Oral Daily  . enoxaparin (LOVENOX) injection  40 mg Subcutaneous Q24H  . escitalopram  10 mg  Oral QHS  . fluticasone  1 spray Each Nare BID  . gabapentin  400 mg Oral BID  . insulin aspart  0-5 Units Subcutaneous QHS  . insulin aspart  0-9 Units Subcutaneous TID WC  . insulin aspart  5 Units Subcutaneous TID WC  . ipratropium  0.5 mg Nebulization Q6H  . multivitamin with minerals  1 tablet Oral Daily  . nicotine  14 mg Transdermal Daily  . pantoprazole  40 mg Oral Daily  . [START ON 01/31/2020] predniSONE  40 mg Oral Q breakfast   Infusions:  . cefTRIAXone (ROCEPHIN)  IV 1 g (01/30/20 1520)   PRN: benzonatate, dextromethorphan-guaiFENesin, ipratropium, metoprolol tartrate   Anti-infectives (From admission, onward)   Start     Dose/Rate Route Frequency Ordered Stop   01/30/20 1400  cefTRIAXone (ROCEPHIN) 1 g in sodium chloride 0.9 % 100 mL IVPB        1 g 200 mL/hr over 30 Minutes Intravenous Every 24 hours 01/30/20 1345     01/30/20 1400  azithromycin (ZITHROMAX) tablet 500 mg        500 mg Oral Daily 01/30/20 1345     01/30/20 1345  levofloxacin (LEVAQUIN) tablet 500  mg  Status:  Discontinued        500 mg Oral Daily 01/30/20 1331 01/30/20 1345      Assessment: 59 yo female presents with a COPD exacerbation but elevated troponin of 336 which increased to 1179. Per cardiology, elevated troponin seems to be due to demand ischemia and the patient is planned to undergo echocardiogram and overall ischemic evaluation once respiratory status improves. Pharmacy is consulted to dose heparin. PTA the patient is not on any anticoagulation.  Goal of Therapy:  Heparin level 0.3-0.7 units/ml Monitor platelets by anticoagulation protocol: Yes   Plan:  Heparin IV 4000 unit bolus x1 dose then 1300 units/hour Obtain 6-hour heparin level Monitor daily heparin level and CBC Monitor for signs and symptoms of bleeding  Sanda Klein, PharmD, RPh  PGY-1 Pharmacy Resident 01/30/2020 3:52 PM  Please check [email or web address] for unit-specific pharmacy phone numbers.

## 2020-01-30 NOTE — Progress Notes (Signed)
Pt placed on the CPAP. She said she is not sure if she would wear it but would like to try. Pt wore it for 5-10 minutes and took it off. She said she would be fine without it . RT will continue to monitor.

## 2020-01-30 NOTE — Consult Note (Signed)
Cardiology Consultation:   Patient ID: Brenda [name] MRN: [id number]; DOB: [birth year]  Admit date: 01/30/2020 Date of Consult: 01/30/2020  Primary Care Provider: [name], MD [facility] Cardiologist: New to [facility]  Patient Profile:   Brenda [name] is a 59 y.o. female with a hx of chronic respiratory failure secondary to COPD on 2 L oxygen 24/7, ongoing tobacco smoking, bipolar disorder, hypertension, obstructive sleep apnea on CPAP, diabetes mellitus, chronic pain and morbid obesity who is being seen today for the evaluation of elevated troponin at the request of Dr. Chipper Herb.   No prior cardiac history.   History of Present Illness:   Brenda [name] has worsening shortness of breath for past 1 week or so.  Associated with cough with recently productive and wheezing.  No fever or chills.  Patient has not got Covid vaccine.  Currently smokes less than half a pack a day.  His morning patient had severe shortness of breath and unable to wear CPAP.  EMS was called and found hypoxic and in distress.  She received nebulizer, albuterol, Atrovent, magnesium and Solu-Medrol by EMS.  She was placed on BiPAP with improved breathing and oxygenation.  Currently off BiPAP.  Breathing stable.  Blood sugar at 455.  Elevated LFTs.  BNP normal.  High-sensitivity troponin 336>>1179.  Covid negative.  Hemoglobin normal.  Chest x-ray showed pneumonitis but no vascular congestion.  Patient denies any chest pain, palpitation, orthopnea or lower extremity edema. She reports recent difficulty swallowing fluids and pills.   Past Medical History:  Diagnosis Date  . Acute pancreatitis   . Asthma    main dyspnea thought due to deconditioning (10/2013)  . Bipolar I disorder, most recent episode (or current) manic, unspecified    pt denies this  . Bronchitis, chronic obstructive (HCC) 2008 & 2009   FeV1 64% TLC 105% DLCO 55% 2008  -  FeV1 81% FeF 25-75 43% 2009  . COPD (chronic obstructive pulmonary disease)  (HCC)    emphysema, not an issue per pulm (10/2013)  . History of pyelonephritis 05/2012  . HTN (hypertension)   . Hx of migraines   . Knee pain, right   . Leukocytosis, unspecified   . Lumbar disc disease with radiculopathy    multilevel spondylitic changes, scoliosis, and anterolisthesis L4 not thought to currently be surg candidate (Dr. Phoebe Perch, [facility]) (Dr. Ollen Bowl, [place])  . Nicotine addiction   . Nonspecific abnormal electrocardiogram (ECG) (EKG)   . Obesity   . Pure hyperglyceridemia   . Suicide attempt (HCC) 06/2001  . Swelling of limb   . Urge and stress incontinence 07/2012   ([facility])    Past Surgical History:  Procedure Laterality Date  . CHOLECYSTECTOMY  07/1982  . CT MAXILLOFACIAL WO/W CM  06/14/06   Left max mucocele  . CT of chest  06/14/2006   Normal except c/w active inflammation / infection.  Left renal atrophy  . ERCP / sphincterotomy - stenosis  01/15/06  . lumbar MRI  11/2010   multilevel spondylitic changes with upper lumbar scoliosis and anterolisthesis of L4 on 5 with some L foraminal narrowing esp at L/3 with concavity of scoliosis, some central stenosis and biforaminal narrowing at L4/5 with spondylolisthesis  . [place] - Pneumonia, acute asthma, left maxillary sinusitis  01/11 - 06/18/2006  . Retained stone     ERCP secondary to retained stone  . Right knee surgery  1984,1989,1989,1991,1994   Reconstructions for ACL insuff  . TUBAL LIGATION  Inpatient Medications: Scheduled Meds: . azithromycin  500 mg Oral Daily  . enoxaparin (LOVENOX) injection  40 mg Subcutaneous Q24H  . escitalopram  10 mg Oral QHS  . fluticasone  1 spray Each Nare BID  . gabapentin  400 mg Oral BID  . insulin aspart  0-5 Units Subcutaneous QHS  . insulin aspart  0-9 Units Subcutaneous TID WC  . insulin aspart  5 Units Subcutaneous TID WC  . ipratropium  0.5 mg Nebulization Q6H  . multivitamin with minerals  1 tablet Oral Daily  . nicotine  14 mg Transdermal Daily   . pantoprazole  40 mg Oral Daily  . [START ON 01/31/2020] predniSONE  40 mg Oral Q breakfast   Continuous Infusions: . cefTRIAXone (ROCEPHIN)  IV     PRN Meds: benzonatate, dextromethorphan-guaiFENesin, ipratropium, metoprolol tartrate  Allergies:    Allergies  Allergen Reactions  . Metolazone Other (See Comments)    Metabolic mood swings    Social History:   Social History   Socioeconomic History  . Marital status: Widowed    Spouse name: Not on file  . Number of children: 3  . Years of education: Not on file  . Highest education level: Not on file  Occupational History  . Occupation: Administrator, artsursing Secretary    Employer: [employer]     Employer: [employer]   Tobacco Use  . Smoking status: Current Some Day Smoker    Years: 41.00    Types: Cigarettes  . Smokeless tobacco: Never Used  . Tobacco comment: 3 cigs per day 05/29/19 lmr  Vaping Use  . Vaping Use: Never used  Substance and Sexual Activity  . Alcohol use: Yes    Alcohol/week: 0.0 standard drinks    Comment: occasional  . Drug use: No  . Sexual activity: Not on file  Other Topics Concern  . Not on file  Social History Narrative   Lives with husband and son and daughter in [place] and [place]   Occupation: Media plannermedical secretary at [employer]   Activity: limited by dyspnea   Diet: good water, fruits/vegetables daily   Social Determinants of Health   Financial Resource Strain:   . Difficulty of Paying Living Expenses: Not on file  Food Insecurity:   . Worried About Programme researcher, broadcasting/film/videounning Out of Food in the Last Year: Not on file  . Ran Out of Food in the Last Year: Not on file  Transportation Needs:   . Lack of Transportation (Medical): Not on file  . Lack of Transportation (Non-Medical): Not on file  Physical Activity:   . Days of Exercise per Week: Not on file  . Minutes of Exercise per Session: Not on file  Stress:   . Feeling of Stress : Not on file  Social Connections:   . Frequency of  Communication with Friends and Family: Not on file  . Frequency of Social Gatherings with Friends and Family: Not on file  . Attends Religious Services: Not on file  . Active Member of Clubs or Organizations: Not on file  . Attends BankerClub or Organization Meetings: Not on file  . Marital Status: Not on file  Intimate Partner Violence:   . Fear of Current or Ex-Partner: Not on file  . Emotionally Abused: Not on file  . Physically Abused: Not on file  . Sexually Abused: Not on file    Family History:   Family History  Problem Relation Age of Onset  . Heart disease Brother  MI in early 37's  . Hypertension Mother   . Hyperlipidemia Mother   . Heart disease Mother   . Hypertension Father   . Asthma Father   . Breast cancer Paternal Grandmother      ROS:  Please see the history of present illness. All other ROS reviewed and negative.     Physical Exam/Data:   Vitals:   01/30/20 1245 01/30/20 1253 01/30/20 1425 01/30/20 1430  BP: 95/66  119/88 128/81  Pulse: (!) 102  (!) 114 (!) 101  Resp: (!) 22  (!) 25 (!) 24  SpO2: 98% 94% 92% 94%   No intake or output data in the 24 hours ending 01/30/20 1503 Last 3 Weights 11/13/2019 09/26/2019 09/15/2019  Weight (lbs) 216 lb 221 lb 221 lb  Weight (kg) 97.977 kg 100.245 kg 100.245 kg     There is no height or weight on file to calculate BMI.  General:  Well nourished, well developed, in no acute distress HEENT: normal Lymph: no adenopathy Neck: no JVD Endocrine:  No thryomegaly Vascular: No carotid bruits; FA pulses 2+ bilaterally without bruits  Cardiac:  normal S1, S2; RRR; no murmur  Lungs: Diffuse wheezing throughout Abd: soft, nontender, no hepatomegaly  Ext: no edema Musculoskeletal:  No deformities, BUE and BLE strength normal and equal Skin: warm and dry  Neuro:  CNs 2-12 intact, no focal abnormalities noted Psych:  Normal affect   EKG:  The EKG was personally reviewed and demonstrates: Sinus/atrial tachycardia at  rate of 147 bpm Telemetry:  Telemetry was personally reviewed and demonstrates: Sinus tachycardia rare PAC  Relevant CV Studies:  Echo 07/2015 Study Conclusions   - Left ventricle: The cavity size was normal. Systolic function was  normal. The estimated ejection fraction was in the range of 60%  to 65%. Wall motion was normal; there were no regional wall  motion abnormalities.  - Mitral valve: Calcified annulus.  - Atrial septum: There was increased thickness of the septum,  consistent with lipomatous hypertrophy.   Laboratory Data:  High Sensitivity Troponin:   Recent Labs  Lab 01/30/20 0955 01/30/20 1206  TROPONINIHS 336* 1,179*     Chemistry Recent Labs  Lab 01/30/20 0955 01/30/20 1008 01/30/20 1207  NA 136 137 137  K 4.9 4.7 4.7  CL 98  --   --   CO2 24  --   --   GLUCOSE 455*  --   --   BUN 17  --   --   CREATININE 1.66*  --   --   CALCIUM 9.1  --   --   GFRNONAA 33*  --   --   GFRAA 38*  --   --   ANIONGAP 14  --   --     Recent Labs  Lab 01/30/20 0955  PROT 7.3  ALBUMIN 3.4*  AST 193*  ALT 208*  ALKPHOS 126  BILITOT 0.4   Hematology Recent Labs  Lab 01/30/20 0955 01/30/20 1008 01/30/20 1207  WBC 15.2*  --   --   RBC 4.83  --   --   HGB 14.3 15.6* 16.0*  HCT 47.6* 46.0 47.0*  MCV 98.6  --   --   MCH 29.6  --   --   MCHC 30.0  --   --   RDW 14.0  --   --   PLT 299  --   --    BNP Recent Labs  Lab 01/30/20 0955  BNP 47.7  Radiology/Studies:  DG Chest Portable 1 View  Result Date: 01/30/2020 CLINICAL DATA:  Shortness of breath.  Potential COVID. EXAM: PORTABLE CHEST 1 VIEW COMPARISON:  05/30/2019. FINDINGS: Borderline cardiomegaly. No pulmonary venous congestion. Diffuse prominent bilateral interstitial prominence itis. No pleural effusion or pneumothorax. IMPRESSION: 1. Diffuse prominent bilateral interstitial prominence consistent with pneumonitis. 2.  Borderline cardiomegaly.  No pulmonary venous congestion. Electronically  Signed   By: Maisie Fus  Register   On: 01/30/2020 11:09    Assessment and Plan:   1. Elevated troponin  - Hs-troponin [phone]. Patient denies any chest pain. Has some dysphagia.  - This occurred in severe respiratory distress and COPD exacerbation  - Seem Elevated troponin is due to demand ischemia however she certainly has multiple risk factors (HTN, DM, ongoing tobacco smoking, obesity and evidence of coronary artery disease by high-resolution chest CT in 2019). - Agree with echocardiogram to assess LV function and wall motion - Start IV heparin and ASA - Hold statin given elevated LFTs - Hold BB given COPD exacerbation - Likely ischemic evaluation once better respiratory status   2. COPD exacerbation  - Per primary team  3. HTN - BP stable  4.  Transaminitis -Hold adding statin -Pending right upper quadrant ultrasound  5.  Dysphagia -Per primary team  6.  Diabetes mellitus -Last A1c was 6.46 months ago -Pending lab here -Per primary team  Will follow   For questions or updates, please contact [facility] Please consult [email or web address] for contact info under    Lorelei Pont, PA  01/30/2020 3:03 PM

## 2020-01-30 NOTE — H&P (Signed)
History and Physical    Brenda [name] MVH:[id number] DOB: May 25, 1961 DOA: 01/30/2020  PCP: [name], MD (Confirm with patient/family/NH records and if not entered, this has to be entered at [facility] point of entry) Patient coming from: Home  I have personally briefly reviewed patient's old medical records in [facility]  Chief Complaint: SOB  HPI: Brenda [name] is a 59 y.o. female with medical history significant of chronic hypoxic respiratory failure on 2 to 3 L baseline O2 plus CPAP at bedtime, COPD Gold stage III (FEV1 44%, improved with bronchodilator to 50%, 2019), chronic cigarette smoker 5-10 cigarettes a day hypertension, OSA on CPAP at bedtime, IIDM, diabetic neuropathy, chronic pain, depression/anxiety, morbid obesity, presented with increasing short of breath.  Has frequent COPD flareup this year, the most recent exacerbation was in mid July which was treated with p.o. doxycycline and p.o. steroid for 10 days.  For last 2 to 3 days, patient developed increasing short of breath, no wheezing, she has a chronic cough which became productive with yellowish sputum for the last 2 to 3 days as well.  Denies any fever chills.  No chest pains.  Patient also admits to smoking 5 to 10 cigarettes a day. ED Course: Patient was found tachypneic and tachycardia, very lethargic, VBG showed pH 7.14, PCO2 more than 70, patient was placed on BiPAP, 2 hours VBG improved to pH 7.35, PCO2 39.6.  Chest x-ray showed bilateral pneumonitis.  COVID-19 negative.  WBC 15.2 compared to 14.4 six months ago.  Glucose 455, creatinine 1.66 compared to 1.16 baseline.  EKG sinus tachycardia no acute ST changes.  Troponin 336>1100.  Review of Systems: As per HPI otherwise 14 point review of systems negative.    Past Medical History:  Diagnosis Date  . Acute pancreatitis   . Asthma    main dyspnea thought due to deconditioning (10/2013)  . Bipolar I disorder, most recent episode (or current) manic, unspecified      pt denies this  . Bronchitis, chronic obstructive (HCC) 2008 & 2009   FeV1 64% TLC 105% DLCO 55% 2008  -  FeV1 81% FeF 25-75 43% 2009  . COPD (chronic obstructive pulmonary disease) (HCC)    emphysema, not an issue per pulm (10/2013)  . History of pyelonephritis 05/2012  . HTN (hypertension)   . Hx of migraines   . Knee pain, right   . Leukocytosis, unspecified   . Lumbar disc disease with radiculopathy    multilevel spondylitic changes, scoliosis, and anterolisthesis L4 not thought to currently be surg candidate (Dr. Phoebe Perch, [facility]) (Dr. Ollen Bowl, [place])  . Nicotine addiction   . Nonspecific abnormal electrocardiogram (ECG) (EKG)   . Obesity   . Pure hyperglyceridemia   . Suicide attempt (HCC) 06/2001  . Swelling of limb   . Urge and stress incontinence 07/2012   ([facility])    Past Surgical History:  Procedure Laterality Date  . CHOLECYSTECTOMY  07/1982  . CT MAXILLOFACIAL WO/W CM  06/14/06   Left max mucocele  . CT of chest  06/14/2006   Normal except c/w active inflammation / infection.  Left renal atrophy  . ERCP / sphincterotomy - stenosis  01/15/06  . lumbar MRI  11/2010   multilevel spondylitic changes with upper lumbar scoliosis and anterolisthesis of L4 on 5 with some L foraminal narrowing esp at L/3 with concavity of scoliosis, some central stenosis and biforaminal narrowing at L4/5 with spondylolisthesis  . [place] - Pneumonia, acute asthma, left maxillary sinusitis  01/11 - 06/18/2006  . Retained stone     ERCP secondary to retained stone  . Right knee surgery  1984,1989,1989,1991,1994   Reconstructions for ACL insuff  . TUBAL LIGATION       reports that she has been smoking cigarettes. She has smoked for the past 41.00 years. She has never used smokeless tobacco. She reports current alcohol use. She reports that she does not use drugs.  Allergies  Allergen Reactions  . Metolazone Other (See Comments)    Metabolic mood swings    Family History  Problem  Relation Age of Onset  . Heart disease Brother        MI in early 3940's  . Hypertension Mother   . Hyperlipidemia Mother   . Heart disease Mother   . Hypertension Father   . Asthma Father   . Breast cancer Paternal Grandmother      Prior to Admission medications   Medication Sig Start Date End Date Taking? Authorizing Provider  albuterol (PROVENTIL) (2.5 MG/3ML) 0.083% nebulizer solution INHALE 1 VIAL VIA NEBULIZER EVERY 6 HOURS AS NEEDED FOR WHEEZING/SHORTNESS OF BREATH 05/11/18   Eustaquio BoydenGutierrez, Javier, MD  benzonatate (TESSALON) 200 MG capsule Take 1 capsule (200 mg total) by mouth 3 (three) times daily as needed for cough. Swallow whole, to not bite pill 04/22/18   Parrett, Virgel Bouquetammy S, NP  Budeson-Glycopyrrol-Formoterol (BREZTRI AEROSPHERE) 160-9-4.8 MCG/ACT AERO Inhale 2 puffs into the lungs 2 (two) times daily. 05/30/19   Nyoka CowdenWert, Michael B, MD  dextromethorphan-guaiFENesin (MUCINEX DM) 30-600 MG 12hr tablet Take 1 tablet by mouth 2 (two) times daily as needed for cough.    [provider]  doxycycline (VIBRA-TABS) 100 MG tablet Take 1 tablet (100 mg total) by mouth 2 (two) times daily. 12/15/19   Parrett, Virgel Bouquetammy S, NP  escitalopram (LEXAPRO) 20 MG tablet TAKE 1 AND 1/2 TABLETS DAILY BY MOUTH 01/04/20   Eustaquio BoydenGutierrez, Javier, MD  fluticasone [facility](FLONASE) 50 MCG/ACT nasal spray Place 1 spray into both nostrils 2 (two) times daily. 05/30/19   Nyoka CowdenWert, Michael B, MD  furosemide (LASIX) 20 MG tablet TAKE 2 TABLETS BY MOUTH EVERY DAY 09/25/19   Nyoka CowdenWert, Michael B, MD  gabapentin (NEURONTIN) 300 MG capsule TAKE 2 TABLETS IN THE MORNING AND 3 TABLETS AT NIGHT 09/24/19   Eustaquio BoydenGutierrez, Javier, MD  LORazepam (ATIVAN) 0.5 MG tablet TAKE 1/2 TO 1 TABLET BY MOUTH TWICE A DAY AS NEEDED FOR ANXIETY 01/17/20   Eustaquio BoydenGutierrez, Javier, MD  metFORMIN (GLUCOPHAGE) 500 MG tablet TAKE 1 TABLET BY MOUTH EVERYDAY AT BEDTIME 10/02/19   Eustaquio BoydenGutierrez, Javier, MD  Multiple Vitamin (MULTIVITAMIN WITH MINERALS) TABS tablet Take 1 tablet by mouth  daily.    [provider]  nystatin (MYCOSTATIN) 100000 UNIT/ML suspension TAKE 1 TEASPOONFUL BY MOUTH 3 TIMES DAILY AS NEEDED FOR MOUTH SORES. 04/22/18   Parrett, Virgel Bouquetammy S, NP  oxyCODONE-acetaminophen (PERCOCET/ROXICET) 5-325 MG tablet Take 1 tablet by mouth every 8 (eight) hours as needed for severe pain. 09/26/19   Nadara Mustarduda, Marcus V, MD  OXYGEN 2 lpm with sleep with CPAP and during the day with exertion    [provider]  pantoprazole (PROTONIX) 40 MG tablet TAKE 1 TABLET BY MOUTH EVERY DAY 30 TO 60 MINUTES BEFORE THE FIRST MEAL OF THE DAY 06/10/18   Nyoka CowdenWert, Michael B, MD  predniSONE (DELTASONE) 10 MG tablet 4 tabs for 2 days, then 3 tabs for 2 days, 2 tabs for 2 days, then 1 tab for 2 days, then stop 12/15/19  Parrett, Virgel Bouquet, NP  PROAIR RESPICLICK 108 (90 Base) MCG/ACT AEPB INHALE 2 PUFFS INTO THE LUNGS EVERY 6 HOURS AS NEEDED FOR DYSPNEA/WHEEZE 08/09/19   [name], MD  promethazine-dextromethorphan (PROMETHAZINE-DM) 6.25-15 MG/5ML syrup Take 5 mLs by mouth 4 (four) times daily as needed for cough. 09/15/19   Nyoka Cowden, MD  spironolactone (ALDACTONE) 25 MG tablet TAKE 1 TABLET BY MOUTH TWICE A DAY 01/17/20   Nyoka Cowden, MD    Physical Exam: Vitals:   01/30/20 1145 01/30/20 1245 01/30/20 1253 01/30/20 1425  BP: 121/88 95/66  119/88  Pulse: (!) 116 (!) 102  (!) 114  Resp: (!) 32 (!) 22  (!) 25  SpO2: 95% 98% 94% 92%    Constitutional: NAD, calm, comfortable Vitals:   01/30/20 1145 01/30/20 1245 01/30/20 1253 01/30/20 1425  BP: 121/88 95/66  119/88  Pulse: (!) 116 (!) 102  (!) 114  Resp: (!) 32 (!) 22  (!) 25  SpO2: 95% 98% 94% 92%   Eyes: PERRL, lids and conjunctivae normal ENMT: Mucous membranes are moist. Posterior pharynx clear of any exudate or lesions.Normal dentition.  Neck: normal, supple, no masses, no thyromegaly Respiratory: Diminished breathing sound bilaterally, scattered wheezing bilaterally, no crackles.  Increased respiratory effort. No  accessory muscle use.  Cardiovascular: Regular rate and rhythm, no murmurs / rubs / gallops. No extremity edema. 2+ pedal pulses. No carotid bruits.  Abdomen: no tenderness, no masses palpated. No hepatosplenomegaly. Bowel sounds positive.  Musculoskeletal: no clubbing / cyanosis. No joint deformity upper and lower extremities. Good ROM, no contractures. Normal muscle tone.  Skin: no rashes, lesions, ulcers. No induration Neurologic: CN 2-12 grossly intact. Sensation intact, DTR normal. Strength 5/5 in all 4.  Psychiatric: Normal judgment and insight. Alert and oriented x 3. Normal mood.     Labs on Admission: I have personally reviewed following labs and imaging studies  CBC: Recent Labs  Lab 01/30/20 0955 01/30/20 1008 01/30/20 1207  WBC 15.2*  --   --   NEUTROABS 6.9  --   --   HGB 14.3 15.6* 16.0*  HCT 47.6* 46.0 47.0*  MCV 98.6  --   --   PLT 299  --   --    Basic Metabolic Panel: Recent Labs  Lab 01/30/20 0955 01/30/20 1008 01/30/20 1207  NA 136 137 137  K 4.9 4.7 4.7  CL 98  --   --   CO2 24  --   --   GLUCOSE 455*  --   --   BUN 17  --   --   CREATININE 1.66*  --   --   CALCIUM 9.1  --   --    GFR: CrCl cannot be calculated (Unknown ideal weight.). Liver Function Tests: Recent Labs  Lab 01/30/20 0955  AST 193*  ALT 208*  ALKPHOS 126  BILITOT 0.4  PROT 7.3  ALBUMIN 3.4*   No results for input(s): LIPASE, AMYLASE in the last 168 hours. No results for input(s): AMMONIA in the last 168 hours. Coagulation Profile: No results for input(s): INR, PROTIME in the last 168 hours. Cardiac Enzymes: No results for input(s): CKTOTAL, CKMB, CKMBINDEX, TROPONINI in the last 168 hours. BNP (last 3 results) No results for input(s): PROBNP in the last 8760 hours. HbA1C: No results for input(s): HGBA1C in the last 72 hours. CBG: No results for input(s): GLUCAP in the last 168 hours. Lipid Profile: No results for input(s): CHOL, HDL, LDLCALC, TRIG, CHOLHDL,  LDLDIRECT in  the last 72 hours. Thyroid Function Tests: No results for input(s): TSH, T4TOTAL, FREET4, T3FREE, THYROIDAB in the last 72 hours. Anemia Panel: No results for input(s): VITAMINB12, FOLATE, FERRITIN, TIBC, IRON, RETICCTPCT in the last 72 hours. Urine analysis:    Component Value Date/Time   COLORURINE YELLOW 02/24/2010 0953   APPEARANCEUR CLEAR 02/24/2010 0953   LABSPEC 1.012 02/24/2010 0953   PHURINE 5.5 02/24/2010 0953   GLUCOSEU NEGATIVE 02/24/2010 0953   HGBUR NEGATIVE 02/24/2010 0953   HGBUR negative 02/22/2008 1545   BILIRUBINUR negative 01/12/2017 1150   KETONESUR NEGATIVE 02/24/2010 0953   PROTEINUR +- 01/12/2017 1150   PROTEINUR NEGATIVE 02/24/2010 0953   UROBILINOGEN 0.2 01/12/2017 1150   UROBILINOGEN 0.2 02/24/2010 0953   NITRITE negative 01/12/2017 1150   NITRITE NEGATIVE 02/24/2010 0953   LEUKOCYTESUR Small (1+) (A) 01/12/2017 1150    Radiological Exams on Admission: DG Chest Portable 1 View  Result Date: 01/30/2020 CLINICAL DATA:  Shortness of breath.  Potential COVID. EXAM: PORTABLE CHEST 1 VIEW COMPARISON:  05/30/2019. FINDINGS: Borderline cardiomegaly. No pulmonary venous congestion. Diffuse prominent bilateral interstitial prominence itis. No pleural effusion or pneumothorax. IMPRESSION: 1. Diffuse prominent bilateral interstitial prominence consistent with pneumonitis. 2.  Borderline cardiomegaly.  No pulmonary venous congestion. Electronically Signed   By: Maisie Fus  Register   On: 01/30/2020 11:09    EKG: Independently reviewed.  Sinus rhythm, Q-wave on V1 V2 chronic  Assessment/Plan Active Problems:   COPD (chronic obstructive pulmonary disease) (HCC)  (please populate well all problems here in Problem List. (For example, if patient is on BP meds at home and you resume or decide to hold them, it is a problem that needs to be her. Same for CAD, COPD, HLD and so on)  Acute hypoxic and hypercapnic respiratory failure secondary to COPD exacerbation  and pneumonia -Clinical patient has increasing shortness of breath symptoms as well as change of sputum production pattern, cover with antibiotics and COPD exacerbation medications -Off BiPAP now, looks comfortable on 4 L via nasal cannula. -Advised to quit smoking  Acute metabolic encephalopathy chronic CO2 retention -Resolved, continue to monitor -Hold Benzol and narcotic for today  Elevated troponins -Initially was considered related to hypoxia and tachycardia, demanding ischemia -With significant elevation of the second set of troponins, consult cardiology who will come to see patient on) this afternoon. -Ordered repeat troponin in 2 hours and echocardiogram -Start as needed beta-blocker to control heart rate -Echocardiogram  AKI -Blood pressure borderline and on BiPAP, will hold off her blood pressure meds -As needed beta-blocker for tachycardia and hypertension -No maintenance IV fluids given significant lung congestion on chest x-ray  Acute transaminitis -Unknown etiology, clinically there is no signs of right-sided CHF -Check RUQ ultrasound and repeat liver function test tomorrow  IIDM with hyperglycemia -Start standing 5 unit Humalog 3 times daily before meals and sliding scale  HTN -Hold Lasix and spironolactone for borderline hypotension and AKI  Diabetic neuropathy -On gabapentin twice daily  DVT prophylaxis: Lovenox Code Status: Full code Family Communication: None at bedside Disposition Plan: Complicated medical problems of COPD exacerbation and elevation of troponins, expect more than 2 midnight hospital stay Consults called: Cardiology Admission status: Telemetry admission   Emeline General MD [facility] Pager [phone]  01/30/2020, 2:32 PM

## 2020-01-30 NOTE — ED Provider Notes (Signed)
[name] EMERGENCY DEPARTMENT Provider Note   CSN: [id number] Arrival date & time:        History Chief Complaint  Patient presents with  . Respiratory Distress    Brenda [name] is a 59 y.o. female.  The history is provided by the patient and the EMS personnel.  Shortness of Breath Severity:  Severe Onset quality:  Gradual Timing:  Constant Progression:  Worsening Chronicity:  New Context comment:  COPD on 2L of oxygen worsening SOB over the last several days, worse this morning. Given magnesium, duoneb, placed on BIPAP (hypoxic in 80s), given steroids. Relieved by:  Nothing Worsened by:  Nothing Associated symptoms: wheezing   Associated symptoms: no abdominal pain, no chest pain, no cough, no ear pain, no fever, no rash, no sore throat and no vomiting   Risk factors: no hx of PE/DVT   Risk factors comment:  Not vaccinated against COVID      Past Medical History:  Diagnosis Date  . Acute pancreatitis   . Asthma    main dyspnea thought due to deconditioning (10/2013)  . Bipolar I disorder, most recent episode (or current) manic, unspecified    pt denies this  . Bronchitis, chronic obstructive (HCC) 2008 & 2009   FeV1 64% TLC 105% DLCO 55% 2008  -  FeV1 81% FeF 25-75 43% 2009  . COPD (chronic obstructive pulmonary disease) (HCC)    emphysema, not an issue per pulm (10/2013)  . History of pyelonephritis 05/2012  . HTN (hypertension)   . Hx of migraines   . Knee pain, right   . Leukocytosis, unspecified   . Lumbar disc disease with radiculopathy    multilevel spondylitic changes, scoliosis, and anterolisthesis L4 not thought to currently be surg candidate (Dr. Phoebe Perch, [facility]) (Dr. Ollen Bowl, [place])  . Nicotine addiction   . Nonspecific abnormal electrocardiogram (ECG) (EKG)   . Obesity   . Pure hyperglyceridemia   . Suicide attempt (HCC) 06/2001  . Swelling of limb   . Urge and stress incontinence 07/2012   ([facility])    Patient Active  Problem List   Diagnosis Date Noted  . Abnormal appearance of cervix 08/07/2019  . CKD (chronic kidney disease) stage 3, GFR 30-59 ml/min 08/07/2019  . Knee mass, right 11/28/2018  . Advanced care planning/counseling discussion 07/27/2018  . Unilateral primary osteoarthritis, left knee 10/28/2017  . COPD  GOLD II/III still smoking/ 02 dep  10/20/2017  . Cor pulmonale (chronic) (HCC) 09/16/2017  . Diastolic dysfunction 08/26/2017  . Edema 08/04/2017  . Grief 02/24/2017  . Chronic respiratory failure with hypoxia (HCC) 07/31/2015  . Type 2 diabetes, controlled, with peripheral neuropathy (HCC)   . OSA on CPAP 09/14/2014  . Health maintenance examination 09/14/2014  . Upper airway cough syndrome 09/22/2013  . Chronic pain syndrome 05/30/2013  . Migraine, unspecified, without mention of intractable migraine without mention of status migrainosus 05/30/2013  . Pyelonephritis 05/05/2012  . Asthmatic bronchitis 02/27/2008  . Leukocytosis 02/22/2008  . NONSPECIFIC ABNORMAL ELECTROCARDIOGRAM 02/22/2008  . HYPERTENSION, BENIGN ESSENTIAL 06/23/2007  . HYPERTRIGLYCERIDEMIA 06/01/2007  . Morbid obesity with BMI of 40.0-44.9, adult (HCC) 06/01/2007  . Adjustment disorder with anxiety 06/01/2007  . Cigarette smoker 06/01/2007  . Left leg swelling 05/04/2007    Past Surgical History:  Procedure Laterality Date  . CHOLECYSTECTOMY  07/1982  . CT MAXILLOFACIAL WO/W CM  06/14/06   Left max mucocele  . CT of chest  06/14/2006   Normal except c/w active inflammation /  infection.  Left renal atrophy  . ERCP / sphincterotomy - stenosis  01/15/06  . lumbar MRI  11/2010   multilevel spondylitic changes with upper lumbar scoliosis and anterolisthesis of L4 on 5 with some L foraminal narrowing esp at L/3 with concavity of scoliosis, some central stenosis and biforaminal narrowing at L4/5 with spondylolisthesis  . [place] - Pneumonia, acute asthma, left maxillary sinusitis  01/11 - 06/18/2006  .  Retained stone     ERCP secondary to retained stone  . Right knee surgery  1984,1989,1989,1991,1994   Reconstructions for ACL insuff  . TUBAL LIGATION       OB History    Gravida  4   Para  4   Term  4   Preterm      AB      Living  4     SAB      TAB      Ectopic      Multiple      Live Births  4           Family History  Problem Relation Age of Onset  . Heart disease Brother        MI in early 6640's  . Hypertension Mother   . Hyperlipidemia Mother   . Heart disease Mother   . Hypertension Father   . Asthma Father   . Breast cancer Paternal Grandmother     Social History   Tobacco Use  . Smoking status: Current Some Day Smoker    Years: 41.00    Types: Cigarettes  . Smokeless tobacco: Never Used  . Tobacco comment: 3 cigs per day 05/29/19 lmr  Vaping Use  . Vaping Use: Never used  Substance Use Topics  . Alcohol use: Yes    Alcohol/week: 0.0 standard drinks    Comment: occasional  . Drug use: No    Home Medications Prior to Admission medications   Medication Sig Start Date End Date Taking? Authorizing Provider  albuterol (PROVENTIL) (2.5 MG/3ML) 0.083% nebulizer solution INHALE 1 VIAL VIA NEBULIZER EVERY 6 HOURS AS NEEDED FOR WHEEZING/SHORTNESS OF BREATH 05/11/18   Eustaquio BoydenGutierrez, Javier, MD  benzonatate (TESSALON) 200 MG capsule Take 1 capsule (200 mg total) by mouth 3 (three) times daily as needed for cough. Swallow whole, to not bite pill 04/22/18   Parrett, Virgel Bouquetammy S, NP  Budeson-Glycopyrrol-Formoterol (BREZTRI AEROSPHERE) 160-9-4.8 MCG/ACT AERO Inhale 2 puffs into the lungs 2 (two) times daily. 05/30/19   Nyoka CowdenWert, Michael B, MD  dextromethorphan-guaiFENesin (MUCINEX DM) 30-600 MG 12hr tablet Take 1 tablet by mouth 2 (two) times daily as needed for cough.    [provider]  doxycycline (VIBRA-TABS) 100 MG tablet Take 1 tablet (100 mg total) by mouth 2 (two) times daily. 12/15/19   Parrett, Virgel Bouquetammy S, NP  escitalopram (LEXAPRO) 20 MG tablet  TAKE 1 AND 1/2 TABLETS DAILY BY MOUTH 01/04/20   Eustaquio BoydenGutierrez, Javier, MD  fluticasone [facility](FLONASE) 50 MCG/ACT nasal spray Place 1 spray into both nostrils 2 (two) times daily. 05/30/19   Nyoka CowdenWert, Michael B, MD  furosemide (LASIX) 20 MG tablet TAKE 2 TABLETS BY MOUTH EVERY DAY 09/25/19   Nyoka CowdenWert, Michael B, MD  gabapentin (NEURONTIN) 300 MG capsule TAKE 2 TABLETS IN THE MORNING AND 3 TABLETS AT NIGHT 09/24/19   Eustaquio BoydenGutierrez, Javier, MD  LORazepam (ATIVAN) 0.5 MG tablet TAKE 1/2 TO 1 TABLET BY MOUTH TWICE A DAY AS NEEDED FOR ANXIETY 01/17/20   Eustaquio BoydenGutierrez, Javier, MD  metFORMIN (GLUCOPHAGE) 500 MG tablet  TAKE 1 TABLET BY MOUTH EVERYDAY AT BEDTIME 10/02/19   [name], MD  Multiple Vitamin (MULTIVITAMIN WITH MINERALS) TABS tablet Take 1 tablet by mouth daily.    [provider]  nystatin (MYCOSTATIN) 100000 UNIT/ML suspension TAKE 1 TEASPOONFUL BY MOUTH 3 TIMES DAILY AS NEEDED FOR MOUTH SORES. 04/22/18   Parrett, Virgel Bouquet, NP  oxyCODONE-acetaminophen (PERCOCET/ROXICET) 5-325 MG tablet Take 1 tablet by mouth every 8 (eight) hours as needed for severe pain. 09/26/19   [name], MD  OXYGEN 2 lpm with sleep with CPAP and during the day with exertion    [provider]  pantoprazole (PROTONIX) 40 MG tablet TAKE 1 TABLET BY MOUTH EVERY DAY 30 TO 60 MINUTES BEFORE THE FIRST MEAL OF THE DAY 06/10/18   Nyoka Cowden, MD  predniSONE (DELTASONE) 10 MG tablet 4 tabs for 2 days, then 3 tabs for 2 days, 2 tabs for 2 days, then 1 tab for 2 days, then stop 12/15/19   Parrett, Virgel Bouquet, NP  PROAIR RESPICLICK 108 (90 Base) MCG/ACT AEPB INHALE 2 PUFFS INTO THE LUNGS EVERY 6 HOURS AS NEEDED FOR DYSPNEA/WHEEZE 08/09/19   [name], MD  promethazine-dextromethorphan (PROMETHAZINE-DM) 6.25-15 MG/5ML syrup Take 5 mLs by mouth 4 (four) times daily as needed for cough. 09/15/19   Nyoka Cowden, MD  spironolactone (ALDACTONE) 25 MG tablet TAKE 1 TABLET BY MOUTH TWICE A DAY 01/17/20   Nyoka Cowden, MD     Allergies    Metolazone  Review of Systems   Review of Systems  Constitutional: Negative for chills and fever.  HENT: Negative for ear pain and sore throat.   Eyes: Negative for pain and visual disturbance.  Respiratory: Positive for shortness of breath and wheezing. Negative for cough.   Cardiovascular: Negative for chest pain and palpitations.  Gastrointestinal: Negative for abdominal pain and vomiting.  Genitourinary: Negative for dysuria and hematuria.  Musculoskeletal: Negative for arthralgias and back pain.  Skin: Negative for color change and rash.  Neurological: Negative for seizures and syncope.  All other systems reviewed and are negative.   Physical Exam Updated Vital Signs  ED Triage Vitals  Enc Vitals Group     BP 01/30/20 1006 123/72     Pulse Rate 01/30/20 1006 (!) 120     Resp 01/30/20 1006 (!) 30     Temp --      Temp src --      SpO2 01/30/20 1006 96 %     Weight --      Height --      Head Circumference --      Peak Flow --      Pain Score 01/30/20 1013 0     Pain Loc --      Pain Edu? --      Excl. in GC? --     Physical Exam Vitals and nursing note reviewed.  Constitutional:      General: She is in acute distress.     Appearance: She is well-developed. She is obese. She is ill-appearing.  HENT:     Head: Normocephalic and atraumatic.     Nose: Nose normal.     Mouth/Throat:     Mouth: Mucous membranes are moist.  Eyes:     Extraocular Movements: Extraocular movements intact.     Conjunctiva/sclera: Conjunctivae normal.     Pupils: Pupils are equal, round, and reactive to light.  Cardiovascular:     Rate and Rhythm: Regular rhythm. Tachycardia  present.     Heart sounds: Normal heart sounds. No murmur heard.   Pulmonary:     Effort: Respiratory distress present.     Breath sounds: Wheezing present.     Comments: Poor air movement, diminished breath sounds throughout  Abdominal:     Palpations: Abdomen is soft.     Tenderness:  There is no abdominal tenderness.  Musculoskeletal:        General: Normal range of motion.     Cervical back: Normal range of motion and neck supple.     Right lower leg: No edema.     Left lower leg: No edema.  Skin:    General: Skin is warm and dry.     Capillary Refill: Capillary refill takes less than 2 seconds.  Neurological:     General: No focal deficit present.     Mental Status: She is alert and oriented to person, place, and time.  Psychiatric:        Mood and Affect: Mood normal.     ED Results / Procedures / Treatments   Labs (all labs ordered are listed, but only abnormal results are displayed) Labs Reviewed  BASIC METABOLIC PANEL - Abnormal; Notable for the following components:      Result Value   Glucose, Bld 455 (*)    Creatinine, Ser 1.66 (*)    GFR calc non Af Amer 33 (*)    GFR calc Af Amer 38 (*)    All other components within normal limits  CBC WITH DIFFERENTIAL/PLATELET - Abnormal; Notable for the following components:   WBC 15.2 (*)    HCT 47.6 (*)    Lymphs Abs 6.8 (*)    Monocytes Absolute 1.1 (*)    All other components within normal limits  HEPATIC FUNCTION PANEL - Abnormal; Notable for the following components:   Albumin 3.4 (*)    AST 193 (*)    ALT 208 (*)    Indirect Bilirubin 0.2 (*)    All other components within normal limits  I-STAT VENOUS BLOOD GAS, ED - Abnormal; Notable for the following components:   pH, Ven 7.146 (*)    pCO2, Ven 70.7 (*)    pO2, Ven 28.0 (*)    Acid-base deficit 6.0 (*)    Calcium, Ion 1.12 (*)    Hemoglobin 15.6 (*)    All other components within normal limits  I-STAT VENOUS BLOOD GAS, ED - Abnormal; Notable for the following components:   pCO2, Ven 39.6 (*)    Acid-base deficit 3.0 (*)    Calcium, Ion 1.02 (*)    HCT 47.0 (*)    Hemoglobin 16.0 (*)    All other components within normal limits  TROPONIN I (HIGH SENSITIVITY) - Abnormal; Notable for the following components:   Troponin I (High  Sensitivity) 336 (*)    All other components within normal limits  SARS CORONAVIRUS 2 BY RT PCR (HOSPITAL ORDER, PERFORMED IN [facility])  BRAIN NATRIURETIC PEPTIDE  TROPONIN I (HIGH SENSITIVITY)    EKG EKG Interpretation  Date/Time:  Tuesday January 30 2020 09:58:30 EDT Ventricular Rate:  147 PR Interval:    QRS Duration: 87 QT Interval:  268 QTC Calculation: 419 R Axis:   -56 Text Interpretation: Sinus or ectopic atrial tachycardia Left anterior fascicular block Anterior infarct, old Confirmed by Virgina Norfolk [phone]) on 01/30/2020 10:15:27 AM   Radiology DG Chest Portable 1 View  Result Date: 01/30/2020 CLINICAL DATA:  Shortness of breath.  Potential COVID. EXAM: PORTABLE CHEST 1 VIEW COMPARISON:  05/30/2019. FINDINGS: Borderline cardiomegaly. No pulmonary venous congestion. Diffuse prominent bilateral interstitial prominence itis. No pleural effusion or pneumothorax. IMPRESSION: 1. Diffuse prominent bilateral interstitial prominence consistent with pneumonitis. 2.  Borderline cardiomegaly.  No pulmonary venous congestion. Electronically Signed   By: Maisie Fus  Register   On: 01/30/2020 11:09    Procedures .Critical Care Performed by: Virgina Norfolk, DO Authorized by: Virgina Norfolk, DO   Critical care provider statement:    Critical care time (minutes):  45   Critical care was necessary to treat or prevent imminent or life-threatening deterioration of the following conditions:  Respiratory failure   Critical care was time spent personally by me on the following activities:  Blood draw for specimens, development of treatment plan with patient or surrogate, discussions with primary provider, evaluation of patient's response to treatment, examination of patient, ordering and performing treatments and interventions, ordering and review of laboratory studies, ordering and review of radiographic studies, pulse oximetry, re-evaluation of patient's condition and review of old  charts   I assumed direction of critical care for this patient from another provider in my specialty: no     (including critical care time)  Medications Ordered in ED Medications  albuterol (PROVENTIL,VENTOLIN) solution continuous neb (10 mg/hr Nebulization Given 01/30/20 1010)  sodium chloride 0.9 % bolus 250 mL (250 mLs Intravenous New Bag/Given 01/30/20 1217)  sodium chloride 0.9 % bolus 500 mL (500 mLs Intravenous New Bag/Given 01/30/20 1217)    ED Course  I have reviewed the triage vital signs and the nursing notes.  Pertinent labs & imaging results that were available during my care of the patient were reviewed by me and considered in my medical decision making (see chart for details).    MDM Rules/Calculators/A&P                          Brenda [name] is a 59 year old female with history of COPD on 2 L of oxygen at baseline, hypertension who presents to the ED with shortness of breath.  Patient hypoxic in the 80s even while on BiPAP.  He was switched over to our BiPAP and her oxygenation improved greatly.  She has wheezing and poor breath sounds and diminished breath sounds throughout on exam.  Patient has already gotten Solu-Medrol, magnesium, duo nebs with EMS.  Patient states she is not vaccinated against coronavirus.  Has not had fever.  No close exposures.  Appears to be in respiratory distress with increased work of breathing and tachypnea.  However she appears slightly more stable on BiPAP and will continue BiPAP at this time despite not knowing Covid status.  On continuous albuterol.  Will give small fluid bolus.  EKG shows sinus tachycardia.  No ischemic changes.  Patient not having any chest pain.  VBG upon arrival was drawn and shows a pH is 7.14 and a CO2 of 70.  However patient still seems to be improving from a clinical standpoint and will continue to trial BiPAP but may need intubation.  Patient is agreeable to this.  We will hold antibiotics at this time.  Chest x-ray with  diffuse interstitial prominence and inflammatory changes.  No real focal pneumonia.  No fever.  Leukocytosis of 15.  However no other significant anemia.  Blood sugar is elevated in the 400s but no DKA.  Creatinine mildly elevated at 1.66.  Will give IV fluids.  Will hold antibiotics at this time  and defer to medicine.  After about 2 hours on BiPAP patient is greatly improved.  Repeat blood gas shows great improvement with a pH of 7.35, CO2 of 40.  Overall suspect COPD exacerbation and will admit for further care.  This chart was dictated using voice recognition software.  Despite best efforts to proofread,  errors can occur which can change the documentation meaning.    Final Clinical Impression(s) / ED Diagnoses Final diagnoses:  Acute respiratory failure with hypoxia (HCC)  COPD exacerbation [facility])    Rx / DC Orders ED Discharge Orders    None       Virgina Norfolk, DO 01/30/20 1230

## 2020-01-31 ENCOUNTER — Inpatient Hospital Stay (HOSPITAL_COMMUNITY): Payer: Medicare Other

## 2020-01-31 DIAGNOSIS — R778 Other specified abnormalities of plasma proteins: Secondary | ICD-10-CM

## 2020-01-31 DIAGNOSIS — N179 Acute kidney failure, unspecified: Secondary | ICD-10-CM

## 2020-01-31 DIAGNOSIS — J441 Chronic obstructive pulmonary disease with (acute) exacerbation: Principal | ICD-10-CM

## 2020-01-31 DIAGNOSIS — J9601 Acute respiratory failure with hypoxia: Secondary | ICD-10-CM

## 2020-01-31 DIAGNOSIS — R0602 Shortness of breath: Secondary | ICD-10-CM

## 2020-01-31 DIAGNOSIS — J449 Chronic obstructive pulmonary disease, unspecified: Secondary | ICD-10-CM

## 2020-01-31 DIAGNOSIS — J121 Respiratory syncytial virus pneumonia: Secondary | ICD-10-CM

## 2020-01-31 HISTORY — DX: Respiratory syncytial virus pneumonia: J12.1

## 2020-01-31 LAB — CBC
HCT: 44.4 % (ref 36.0–46.0)
Hemoglobin: 14.4 g/dL (ref 12.0–15.0)
MCH: 30.1 pg (ref 26.0–34.0)
MCHC: 32.4 g/dL (ref 30.0–36.0)
MCV: 92.9 fL (ref 80.0–100.0)
Platelets: 159 10*3/uL (ref 150–400)
RBC: 4.78 MIL/uL (ref 3.87–5.11)
RDW: 13.7 % (ref 11.5–15.5)
WBC: 17.4 10*3/uL — ABNORMAL HIGH (ref 4.0–10.5)
nRBC: 0 % (ref 0.0–0.2)

## 2020-01-31 LAB — COMPREHENSIVE METABOLIC PANEL
ALT: 1577 U/L — ABNORMAL HIGH (ref 0–44)
AST: 1344 U/L — ABNORMAL HIGH (ref 15–41)
Albumin: 3.6 g/dL (ref 3.5–5.0)
Alkaline Phosphatase: 124 U/L (ref 38–126)
Anion gap: 13 (ref 5–15)
BUN: 29 mg/dL — ABNORMAL HIGH (ref 8–23)
CO2: 22 mmol/L (ref 22–32)
Calcium: 9.3 mg/dL (ref 8.9–10.3)
Chloride: 101 mmol/L (ref 98–111)
Creatinine, Ser: 1.57 mg/dL — ABNORMAL HIGH (ref 0.44–1.00)
GFR calc Af Amer: 41 mL/min — ABNORMAL LOW (ref >60)
GFR calc non Af Amer: 35 mL/min — ABNORMAL LOW (ref >60)
Glucose, Bld: 133 mg/dL — ABNORMAL HIGH (ref 70–99)
Potassium: 4 mmol/L (ref 3.5–5.1)
Sodium: 136 mmol/L (ref 135–145)
Total Bilirubin: 0.4 mg/dL (ref 0.3–1.2)
Total Protein: 7.3 g/dL (ref 6.5–8.1)

## 2020-01-31 LAB — GLUCOSE, CAPILLARY
Glucose-Capillary: 152 mg/dL — ABNORMAL HIGH (ref 70–99)
Glucose-Capillary: 110 mg/dL — ABNORMAL HIGH (ref 70–99)
Glucose-Capillary: 146 mg/dL — ABNORMAL HIGH (ref 70–99)
Glucose-Capillary: 111 mg/dL — ABNORMAL HIGH (ref 70–99)

## 2020-01-31 LAB — SEDIMENTATION RATE: Sed Rate: 23 mm/hr — ABNORMAL HIGH (ref 0–22)

## 2020-01-31 LAB — HEPATITIS PANEL, ACUTE
HCV Ab: NONREACTIVE
Hep A IgM: NONREACTIVE
Hep B C IgM: NONREACTIVE
Hepatitis B Surface Ag: NONREACTIVE

## 2020-01-31 LAB — EXPECTORATED SPUTUM ASSESSMENT W GRAM STAIN, RFLX TO RESP C

## 2020-01-31 LAB — TROPONIN I (HIGH SENSITIVITY)
Troponin I (High Sensitivity): 2853 ng/L (ref <18)
Troponin I (High Sensitivity): 3204 ng/L (ref <18)

## 2020-01-31 LAB — ECHOCARDIOGRAM COMPLETE
Area-P 1/2: 3.54 cm2
Height: 61.5 in
S' Lateral: 4.2 cm
Weight: 3520 oz

## 2020-01-31 LAB — HEPARIN LEVEL (UNFRACTIONATED): Heparin Unfractionated: 0.4 IU/mL (ref 0.30–0.70)

## 2020-01-31 MED ORDER — REVEFENACIN 175 MCG/3ML IN SOLN
175.0000 ug | Freq: Every day | RESPIRATORY_TRACT | Status: DC
  Administered 2020-01-31 – 2020-02-04 (×5): 175 ug via RESPIRATORY_TRACT
  Filled 2020-01-31 (×6): qty 3

## 2020-01-31 MED ORDER — LORAZEPAM 2 MG/ML IJ SOLN
0.5000 mg | Freq: Four times a day (QID) | INTRAMUSCULAR | Status: DC | PRN
  Administered 2020-01-31 – 2020-02-03 (×4): 0.5 mg via INTRAVENOUS
  Filled 2020-01-31 (×4): qty 1

## 2020-01-31 MED ORDER — BUDESONIDE 0.5 MG/2ML IN SUSP
0.5000 mg | Freq: Two times a day (BID) | RESPIRATORY_TRACT | Status: DC
  Administered 2020-01-31 – 2020-02-04 (×8): 0.5 mg via RESPIRATORY_TRACT
  Filled 2020-01-31 (×8): qty 2

## 2020-01-31 MED ORDER — ARFORMOTEROL TARTRATE 15 MCG/2ML IN NEBU
15.0000 ug | INHALATION_SOLUTION | Freq: Two times a day (BID) | RESPIRATORY_TRACT | Status: DC
  Administered 2020-01-31 – 2020-02-04 (×8): 15 ug via RESPIRATORY_TRACT
  Filled 2020-01-31 (×9): qty 2

## 2020-01-31 MED ORDER — ESCITALOPRAM OXALATE 10 MG PO TABS
30.0000 mg | ORAL_TABLET | Freq: Every day | ORAL | Status: DC
  Administered 2020-01-31 – 2020-02-04 (×5): 30 mg via ORAL
  Filled 2020-01-31: qty 3
  Filled 2020-01-31: qty 1
  Filled 2020-01-31 (×3): qty 3

## 2020-01-31 MED ORDER — FUROSEMIDE 10 MG/ML IJ SOLN
40.0000 mg | Freq: Once | INTRAMUSCULAR | Status: AC
  Administered 2020-01-31: 40 mg via INTRAVENOUS
  Filled 2020-01-31: qty 4

## 2020-01-31 MED ORDER — IPRATROPIUM-ALBUTEROL 0.5-2.5 (3) MG/3ML IN SOLN
3.0000 mL | Freq: Four times a day (QID) | RESPIRATORY_TRACT | Status: DC
  Administered 2020-01-31 – 2020-02-04 (×14): 3 mL via RESPIRATORY_TRACT
  Filled 2020-01-31 (×14): qty 3

## 2020-01-31 MED ORDER — GLUCERNA SHAKE PO LIQD
237.0000 mL | Freq: Two times a day (BID) | ORAL | Status: DC
  Administered 2020-02-03 (×2): 237 mL via ORAL
  Filled 2020-01-31: qty 237

## 2020-01-31 MED ORDER — LORAZEPAM 0.5 MG PO TABS
0.2500 mg | ORAL_TABLET | Freq: Two times a day (BID) | ORAL | Status: DC | PRN
  Administered 2020-01-31: 0.25 mg via ORAL
  Filled 2020-01-31: qty 1

## 2020-01-31 MED ORDER — ALBUTEROL SULFATE (2.5 MG/3ML) 0.083% IN NEBU
2.5000 mg | INHALATION_SOLUTION | RESPIRATORY_TRACT | Status: DC | PRN
  Administered 2020-01-31: 5 mg via RESPIRATORY_TRACT
  Administered 2020-01-31 – 2020-02-04 (×3): 2.5 mg via RESPIRATORY_TRACT
  Filled 2020-01-31 (×5): qty 3

## 2020-01-31 NOTE — Progress Notes (Signed)
Paged secondary to patient change in status with regard to worsening respiratory distress and tachycardia.  Per patient, symptoms started after she had gotten up to walk to and from the restroom. She had worsening trouble breathing which caused her to panic. From there she continued to have worsening symptoms/signs of dyspnea and tachycardia.  On initial review of telemetry, there appeared to be a wide complex tachycardia with rate up to 150 bpm in setting of bundle branch block. Upon entering the room, patient was visibly in distress with tachypnea but mostly appeared anxious. While in the room, HR improved down to 110s. On exam, patient with diffuse wheezing and decreased air movement (slightly better than this morning), tachycardia with no irregular rhythms noted. Abdomen is soft and non-tender with normal bowel sounds. Pupils were dilated but reactive.  Plan: -Transfer to progressive care -Lasix 40 mg IV x1 -Continue Ativan prn -BiPAP prn  Jacquelin Hawking, MD [facility] 01/31/2020, 6:37 PM

## 2020-01-31 NOTE — Evaluation (Signed)
Physical Therapy Evaluation Patient Details Name: Brenda [name] MRN: [id number] DOB: 05-10-1961 Today's Date: 01/31/2020   History of Present Illness  Pt is 59 yo female with PMH including resp failure on 2-3 L O2, COPD, OSA, DM2, chronic pain, depression/anxiety, and obesity.  Pt presented with dyspnea and admitted for COPD exacerbation.  Pt found to have elevate troponins and cardiology consulted. EKG negative for acute changes.   Per cardiology demand ischemia vs NSTEMI but no chest pain so favoring demand ischemia.  Clinical Impression  Pt admitted with above diagnosis. Performed limited evaluation due to elevated troponins (but favored to be demand ischemia per cardio, and no chest pain), SHOB, and fatigue.  Pt moving with supervision level but required frequent cues for relaxation techniques, pursed lip breathing, and rest breaks.  Her O2 sats were stable on 3 LPM o2.  Pt would benefit from PT to advance mobility and endurance and educate on energy conservation/relaxation techniques.  Pt currently with functional limitations due to the deficits listed below (see PT Problem List). Pt will benefit from skilled PT to increase their independence and safety with mobility to allow discharge to the venue listed below.       Follow Up Recommendations No PT follow up;Supervision - Intermittent    Equipment Recommendations  Other (comment) (possible rollator pending progress)    Recommendations for Other Services       Precautions / Restrictions Precautions Precautions: Other (comment) Precaution Comments: anxiety; monitor O2      Mobility  Bed Mobility Overal bed mobility: Modified Independent             General bed mobility comments: Pt up to [facility] at arrival.  Able to return to bed with use of bed rail and HOB elevated  Transfers Overall transfer level: Needs assistance Equipment used: None Transfers: Sit to/from [employer] Sit to Stand: Supervision Stand pivot  transfers: Supervision       General transfer comment: Multiple sit to stands to complete ADLS with close supervision for safety.  Pt with increased RR, DOE, and anxiousness -requiring cues for pursed lip breathing, relaxation, and rest breaks. Stand pivot back to bed  Ambulation/Gait             General Gait Details: deferred due to [facility], elevated troponin  Stairs            Wheelchair Mobility    Modified Rankin (Stroke Patients Only)       Balance Overall balance assessment: Needs assistance Sitting-balance support: No upper extremity supported;Feet supported Sitting balance-Leahy Scale: Good     Standing balance support: No upper extremity supported;During functional activity Standing balance-Leahy Scale: Good Standing balance comment: Pt able to perform toielting ADLS but needed cues for deep breathing and relaxation                             Pertinent Vitals/Pain Pain Assessment: No/denies pain    Home Living Family/patient expects to be discharged to:: Private residence Living Arrangements: Children;Other relatives (dtr, son in law, 2 yo grandson) Available Help at Discharge: Family;Available PRN/intermittently (children work) Type of Home: House Home Access: Stairs to enter Entrance Stairs-Rails: Doctor, general practice of Steps: 5 to deck + 1 additional step Home Layout: One level Home Equipment: Cane - single point;Shower seat      Prior Function Level of Independence: Independent         Comments: Independent with ADLs, IADLs in  the home without AD. Pt was driving. Cares for grandson while daughter at work.     Hand Dominance   Dominant Hand: Right    Extremity/Trunk Assessment   Upper Extremity Assessment Upper Extremity Assessment: Defer to OT evaluation    Lower Extremity Assessment Lower Extremity Assessment: Overall WFL for tasks assessed (demonstrating functional strength and ROM; did not MMT due to  [facility], anxiety, elevated troponin)    Cervical / Trunk Assessment Cervical / Trunk Assessment: Normal  Communication   Communication: No difficulties  Cognition Arousal/Alertness: Awake/alert Behavior During Therapy: Anxious Overall Cognitive Status: Within Functional Limits for tasks assessed                                 General Comments: Pleasant, A&Ox4. Anxious throughout but improved with calm education and redirection      General Comments General comments (skin integrity, edema, etc.):  Checked with RN who reports pt ok to attempt PT, reports pt has been up in room today.  Does report pt with anxiety and SHOB, has received medications.    Pt was on BSC at arrival.  She performed toileting ADLs,donned underwear, and transferred back to bed, but required frequent cues for rest, relaxation, and pursed lip breathing.  She became anxious donning underwearing and managing lines- PT cued to let us assist due to [facility].  Pt was on 3 L O2 with sats 91-93%.  DOE 4/4, RR elevated with activity.  Once back in bed RR slowing and SHOB improved.    Exercises     Assessment/Plan    PT Assessment Patient needs continued PT services  PT Problem List Decreased strength;Decreased mobility;Decreased safety awareness;Decreased range of motion;Decreased activity tolerance;Cardiopulmonary status limiting activity;Decreased balance;Decreased knowledge of use of DME       PT Treatment Interventions DME instruction;Therapeutic activities;Gait training;Therapeutic exercise;Patient/family education;Stair training;Balance training;Functional mobility training;Other (comment) (energy conservation)    PT Goals (Current goals can be found in the Care Plan section)  Acute Rehab PT Goals Patient Stated Goal: feel better and go home PT Goal Formulation: With patient/family Time For Goal Achievement: 02/14/20 Potential to Achieve Goals: Good    Frequency Min 3X/week   Barriers to discharge         Co-evaluation               AM-PAC PT "6 Clicks" Mobility  Outcome Measure Help needed turning from your back to your side while in a flat bed without using bedrails?: None Help needed moving from lying on your back to sitting on the side of a flat bed without using bedrails?: None Help needed moving to and from a bed to a chair (including a wheelchair)?: None Help needed standing up from a chair using your arms (e.g., wheelchair or bedside chair)?: None Help needed to walk in hospital room?: A Little Help needed climbing 3-5 steps with a railing? : A Little 6 Click Score: 22    End of Session Equipment Utilized During Treatment: Gait belt Activity Tolerance: Patient limited by fatigue Patient left: in bed;with call bell/phone within reach;with family/visitor present Nurse Communication: Mobility status PT Visit Diagnosis: Muscle weakness (generalized) (M62.81);Other abnormalities of gait and mobility (R26.89)    Time: 7829-5621 PT Time Calculation (min) (ACUTE ONLY): 20 min   Charges:   PT Evaluation $PT Eval Low Complexity: 1 Low          Joshalyn Ancheta, PT Acute Radio broadcast assistant  [phone] [facility] [phone]    Rayetta Humphrey 01/31/2020, 3:54 PM

## 2020-01-31 NOTE — Progress Notes (Signed)
Critical trop of 2853. MD Nettey aware.

## 2020-01-31 NOTE — Significant Event (Signed)
Rapid Response Event Note   Reason for Call :  Respiratory distress  Initial Focused Assessment:  Pt lying in bed, she had just been up to use BSC. Pt now tachypneic, short of breath, and having increased work of breathing. Pt currently on 100% NRB with oxygen saturation 100%. Pt endorses feeling anxious and is tearful. Faint prolonged expiratory wheeze auscultated. Pt is tachycardic at 115 bpm. Radial pulse is strong, 3+.   VS: T 49F, BP 158/83, HR 115, RR 28, SpO2 100% on NRB  Interventions:  -Recovered on 100% NRB, weaned down to [facility] -40 mg IV Lasix  Plan of Care:  -Transfer to PCU for BiPAP  Call rapid response for additional needs.   Event Summary:  MD Notified: Dr. Caleb Popp Call Time: 1727 Arrival Time: 1731 End Time: 1900  Jennye Moccasin, RN

## 2020-01-31 NOTE — Progress Notes (Signed)
Patient to be transferred to 6E. Report given to [facility]

## 2020-01-31 NOTE — Progress Notes (Signed)
ANTICOAGULATION CONSULT NOTE - Follow Up Consult  Pharmacy Consult for Heparin Indication: chest pain/ACS  Allergies  Allergen Reactions  . Metolazone Other (See Comments)    Metabolic mood swings    Patient Measurements: Height: 5' 1.5" (156.2 cm) Weight: 99.8 kg (220 lb) (per patient) IBW/kg (Calculated) : 48.95 Heparin Dosing Weight: 73 kg  Vital Signs: Temp: 97.4 F (36.3 C) (09/01 0619) Temp Source: Oral (09/01 0619) BP: 105/76 (09/01 0619) Pulse Rate: 89 (09/01 0619)  Labs: Recent Labs    01/30/20 0955 01/30/20 0955 01/30/20 1008 01/30/20 1008 01/30/20 1206 01/30/20 1207 01/30/20 1510 01/30/20 2126 01/31/20 0217 01/31/20 0639  HGB 14.3   < > 15.6*   < >  --  16.0*  --   --   --  14.4  HCT 47.6*   < > 46.0  --   --  47.0*  --   --   --  44.4  PLT 299  --   --   --   --   --   --   --   --  159  HEPARINUNFRC  --   --   --   --   --   --   --  0.46 0.40  --   CREATININE 1.66*  --   --   --   --   --   --   --   --  1.57*  CKTOTAL  --   --   --   --   --   --  199  --   --   --   TROPONINIHS 336*   < >  --   --  1,179*  --  1,655*  --   --  2,853*   < > = values in this interval not displayed.    Estimated Creatinine Clearance: 41.2 mL/min (A) (by C-G formula based on SCr of 1.57 mg/dL (H)).  Assessment:  59 yo female presented with a COPD exacerbation and elevated troponins. NSTEMI vs demand ischemia. Pharmacy consulted to dose heparin. PTA the patient is not on any anticoagulation.    Heparin level remains therapeutic (0.40) on 1300 units/hr   Platelet count 299 > 159.  No bleeding reported.    Per cardiology, EKG shows old anterior infarct but no acute ischemic changes. Echo pending. Ischemic evaluation pending overall improvement.  Goal of Therapy:  Heparin level 0.3-0.7 units/ml Monitor platelets by anticoagulation protocol: Yes   Plan:   Continue heparin drip at 1300 units/hr  Daily heparin level and CBC; watch platelet count.  Follow up  Cardiology plans.   Dennie Fetters RPh Phone: [phone] 01/31/2020,11:04 AM

## 2020-01-31 NOTE — Progress Notes (Signed)
Progress Note  Patient Name: Brenda [name] Date of Encounter: 01/31/2020  [facility] Cardiologist: No primary care provider on file.   Subjective   No acute overnight events. Patient tearful this morning when I came in the room - said she is just very stressed with being here and then things at home as well. Breathing slightly better this morning after breathing treatment. No chest pain.   Inpatient Medications    Scheduled Meds: . aspirin EC  81 mg Oral Daily  . azithromycin  500 mg Oral Daily  . escitalopram  10 mg Oral QHS  . fluticasone  1 spray Each Nare BID  . gabapentin  600 mg Oral Daily  . gabapentin  900 mg Oral QHS  . insulin aspart  0-5 Units Subcutaneous QHS  . insulin aspart  0-9 Units Subcutaneous TID WC  . insulin aspart  5 Units Subcutaneous TID WC  . ipratropium  0.5 mg Nebulization Q6H  . multivitamin with minerals  1 tablet Oral Daily  . nicotine  14 mg Transdermal Daily  . ondansetron (ZOFRAN) IV  4 mg Intravenous Once  . pantoprazole  40 mg Oral QAC breakfast  . predniSONE  40 mg Oral Q breakfast   Continuous Infusions: . cefTRIAXone (ROCEPHIN)  IV Stopped (01/30/20 1552)  . heparin 1,300 Units/hr (01/30/20 1752)   PRN Meds: benzonatate, dextromethorphan-guaiFENesin, ipratropium, metoprolol tartrate   Vital Signs    Vitals:   01/30/20 2311 01/31/20 0054 01/31/20 0247 01/31/20 0619  BP:  (!) 97/58  105/76  Pulse: 90 83  89  Resp: 18 16  17   Temp:  98.7 F (37.1 C)  (!) 97.4 F (36.3 C)  TempSrc:  Oral  Oral  SpO2: 95% 94% 94% 96%    Intake/Output Summary (Last 24 hours) at 01/31/2020 0750 Last data filed at 01/30/2020 1843 Gross per 24 hour  Intake 150.32 ml  Output --  Net 150.32 ml   Last 3 Weights 11/13/2019 09/26/2019 09/15/2019  Weight (lbs) 216 lb 221 lb 221 lb  Weight (kg) 97.977 kg 100.245 kg 100.245 kg      Telemetry    Normal sinus rhythm with rates in the 80's to 90's. Occasional PVCs. Brief run of atrial tachycardia vs  SVT. - Personally Reviewed  ECG    No new ECG tracing today. - Personally Reviewed  Physical Exam   GEN: No acute distress.   Neck: Difficult to assess JVD due to body habitus. Cardiac: RRR. No significant murmurs, rubs, or gallops appreciated. Respiratory: Decreased breath sounds throughout. Diffuse expiratory rhonchi and some faint wheezing. No crackles. GI: Soft, non-tender, non-distended  MS: No lower extremity edema. No deformity. Skin: Cool and dry. Neuro:  No focal deficits. Psych: Normal affect. Responds appropriately.   Labs    High Sensitivity Troponin:   Recent Labs  Lab 01/30/20 0955 01/30/20 1206 01/30/20 1510  TROPONINIHS 336* 1,179* 1,655*      Chemistry Recent Labs  Lab 01/30/20 0955 01/30/20 1008 01/30/20 1207  NA 136 137 137  K 4.9 4.7 4.7  CL 98  --   --   CO2 24  --   --   GLUCOSE 455*  --   --   BUN 17  --   --   CREATININE 1.66*  --   --   CALCIUM 9.1  --   --   PROT 7.3  --   --   ALBUMIN 3.4*  --   --   AST 193*  --   --  ALT 208*  --   --   ALKPHOS 126  --   --   BILITOT 0.4  --   --   GFRNONAA 33*  --   --   GFRAA 38*  --   --   ANIONGAP 14  --   --      Hematology Recent Labs  Lab 01/30/20 0955 01/30/20 0955 01/30/20 1008 01/30/20 1207 01/31/20 0639  WBC 15.2*  --   --   --  17.4*  RBC 4.83  --   --   --  4.78  HGB 14.3   < > 15.6* 16.0* 14.4  HCT 47.6*   < > 46.0 47.0* 44.4  MCV 98.6  --   --   --  92.9  MCH 29.6  --   --   --  30.1  MCHC 30.0  --   --   --  32.4  RDW 14.0  --   --   --  13.7  PLT 299  --   --   --  159   < > = values in this interval not displayed.    BNP Recent Labs  Lab 01/30/20 0955  BNP 47.7     DDimer No results for input(s): DDIMER in the last 168 hours.   Radiology    DG Chest Portable 1 View  Result Date: 01/30/2020 CLINICAL DATA:  Shortness of breath.  Potential COVID. EXAM: PORTABLE CHEST 1 VIEW COMPARISON:  05/30/2019. FINDINGS: Borderline cardiomegaly. No pulmonary venous  congestion. Diffuse prominent bilateral interstitial prominence itis. No pleural effusion or pneumothorax. IMPRESSION: 1. Diffuse prominent bilateral interstitial prominence consistent with pneumonitis. 2.  Borderline cardiomegaly.  No pulmonary venous congestion. Electronically Signed   By: Maisie Fus  Register   On: 01/30/2020 11:09   US Abdomen Limited RUQ  Result Date: 01/30/2020 CLINICAL DATA:  Elevated liver enzymes. EXAM: ULTRASOUND ABDOMEN LIMITED RIGHT UPPER QUADRANT COMPARISON:  01/11/2006 abdominal ultrasound. FINDINGS: Gallbladder: Surgically absent. Common bile duct: Diameter: 2.4 mm Liver: No focal lesion identified. Increased parenchymal echogenicity. Portal vein is patent on color Doppler imaging with normal direction of blood flow towards the liver. Other: None. IMPRESSION: Hepatic steatosis.  No focal hepatic lesions. Sequela of cholecystectomy. Electronically Signed   By: Stana Bunting M.D.   On: 01/30/2020 16:50    Cardiac Studies   Echo pending.  Patient Profile     59 y.o. female with a history of COPD with chronic respiratory failure on 2L of O2 at home, ongoing tobacco use, obstructive sleep apnea on CPAP, hypertension, diabetes mellitus, chronic pain, bipolar disorder, and morbid obesity but no prior cardiac history who is being seen for evaluation of elevated troponin after being admitted for COPD exacerbation at the request of Dr. Chipper Herb.  Assessment & Plan    NSTEMI vs Demand Ischemia in Setting of Acute Hypercapnic Respiratory Failure - High-sensitivity troponin elevated at 336 >> 1,179 >> 1,655 >> 2,853.  - EKG shows old anterior infarct but no acute ischemic changes. - Echo pending. - Patient denies any chest pain.  - Continue IV Heparin. - Continue Aspirin. Statin on hold due to acute transaminitis. No beta-blocker due to COPD exacerbation. - Dr. Flora Lipps does favor ischemic evaluation with likely cardiac catheterization once her overall condition improves. Will  defer timing of this to MD.  Hypertension - BP mostly well controlled. Soft at times.  - Home Spironolactone and Lasix on hold due to soft BP and AKI.  COPD Exacerbation - On Azithromycin, Prednisone,  and breathing treatments. - Management per primary team.  AKI - Creatinine 1.66 on admission and 1.57 today. Baseline around 1.1.  - Continue to avoid nephrotoxic agents.  - Continue to monitor closely.   Acute Transaminitis  - AST 193 and ALT 208 on admission. Up to 1,344 and 1,577, respectively, today. - RUQ ultrasound showed hepatic steatosis but no focal hepatic lesions. - Management per primary team.  - With acute transaminitis, AKI, and soft BP, wonder if this could be from some degree of cardiac low output. Echo pending. Will discuss with MD.  Diabetes Mellitus  - Hemoglobin A1c 6.2 this admission. - Management per primary team.  Dysphagia - Management per primary team.    For questions or updates, please contact [facility] Please consult [email or web address] for contact info under        Signed, Corrin Parker, PA-C  01/31/2020, 7:50 AM

## 2020-01-31 NOTE — Progress Notes (Signed)
RT placed patient on bipap per MD order with rapid response RN at bedside. Patient tolerating well at this time. RT will continue to monitor.

## 2020-01-31 NOTE — Progress Notes (Signed)
Initial Nutrition Assessment  DOCUMENTATION CODES:   Morbid obesity  INTERVENTION:  Provide Glucerna Shake po BID, each supplement provides 220 kcal and 10 grams of protein.  Encourage adequate PO intake.   NUTRITION DIAGNOSIS:   Increased nutrient needs related to chronic illness (COPD) as evidenced by estimated needs.  GOAL:   Patient will meet greater than or equal to 90% of their needs  MONITOR:   PO intake, Supplement acceptance, Skin, Weight trends, Labs, I & O's  REASON FOR ASSESSMENT:   Consult Assessment of nutrition requirement/status  ASSESSMENT:   59 y.o. female with medical history significant of COPD Gold stage III,  hypertension, OSA, IIDM, diabetic neuropathy presents with severe dyspnea with concern for COPD exacerbation. Also found to have evidence of possible NSTEMI in addition to labs concerning for acute hepatitis.   Pt reports increased work of breathing causing po intake to be decreased. Meal completion 50%. Pt reports eating well PTA with no difficulties. RD to order nutritional supplements to aid in caloric and protein needs. Pt encouraged to eat her food at meals and to drink her supplements.   NUTRITION - FOCUSED PHYSICAL EXAM:    Most Recent Value  Orbital Region No depletion  Upper Arm Region No depletion  Thoracic and Lumbar Region No depletion  Buccal Region No depletion  Temple Region No depletion  Clavicle Bone Region No depletion  Clavicle and Acromion Bone Region No depletion  Scapular Bone Region No depletion  Dorsal Hand No depletion  Patellar Region No depletion  Anterior Thigh Region No depletion  Posterior Calf Region No depletion  Edema (RD Assessment) None  Hair Reviewed  Eyes Reviewed  Mouth Reviewed  Skin Reviewed  Nails Reviewed     Labs and medications reviewed.   Diet Order:   Diet Order            Diet heart healthy/carb modified Room service appropriate? Yes; Fluid consistency: Thin  Diet effective now                  EDUCATION NEEDS:   Not appropriate for education at this time  Skin:  Skin Assessment: Reviewed RN Assessment  Last BM:  Unknown  Height:   Ht Readings from Last 1 Encounters:  01/31/20 5' 1.5" (1.562 m)    Weight:   Wt Readings from Last 1 Encounters:  01/31/20 99.8 kg    BMI:  Body mass index is 40.9 kg/m.  Estimated Nutritional Needs:   Kcal:  1850-2050  Protein:  100-110 grams  Fluid:  >/= 1.8 L/day  Roslyn Smiling, MS, RD, LDN RD pager number/after hours weekend pager number on Amion.

## 2020-01-31 NOTE — Progress Notes (Signed)
ANTICOAGULATION CONSULT NOTE   Pharmacy Consult for Heparin Indication: ACS  Allergies  Allergen Reactions  . Metolazone Other (See Comments)    Metabolic mood swings    Patient Measurements: Heparin Dosing Weight: 98 kg Height 5\' 1"  (154.9 cm)  Vital Signs: Temp: 97.5 F (36.4 C) (08/31 1745) Temp Source: Oral (08/31 1745) BP: 90/78 (08/31 1745) Pulse Rate: 90 (08/31 2311)  Labs: Recent Labs    01/30/20 0955 01/30/20 0955 01/30/20 1008 01/30/20 1206 01/30/20 1207 01/30/20 1510 01/30/20 2126  HGB 14.3   < > 15.6*  --  16.0*  --   --   HCT 47.6*  --  46.0  --  47.0*  --   --   PLT 299  --   --   --   --   --   --   HEPARINUNFRC  --   --   --   --   --   --  0.46  CREATININE 1.66*  --   --   --   --   --   --   CKTOTAL  --   --   --   --   --  199  --   TROPONINIHS 336*  --   --  1,179*  --  1,655*  --    < > = values in this interval not displayed.    CrCl cannot be calculated (Unknown ideal weight.).   Medical History: Past Medical History:  Diagnosis Date  . Acute pancreatitis   . Asthma    main dyspnea thought due to deconditioning (10/2013)  . Bipolar I disorder, most recent episode (or current) manic, unspecified    pt denies this  . Bronchitis, chronic obstructive (HCC) 2008 & 2009   FeV1 64% TLC 105% DLCO 55% 2008  -  FeV1 81% FeF 25-75 43% 2009  . COPD (chronic obstructive pulmonary disease) (HCC)    emphysema, not an issue per pulm (10/2013)  . History of pyelonephritis 05/2012  . HTN (hypertension)   . Hx of migraines   . Knee pain, right   . Leukocytosis, unspecified   . Lumbar disc disease with radiculopathy    multilevel spondylitic changes, scoliosis, and anterolisthesis L4 not thought to currently be surg candidate (Dr. 06/2012, [facility]) (Dr. Phoebe Perch, [place])  . Nicotine addiction   . Nonspecific abnormal electrocardiogram (ECG) (EKG)   . Obesity   . Pure hyperglyceridemia   . Suicide attempt (HCC) 06/2001  . Swelling of limb   . Urge and  stress incontinence 07/2012   ([place])    Medications:  Scheduled:  . aspirin EC  81 mg Oral Daily  . azithromycin  500 mg Oral Daily  . escitalopram  10 mg Oral QHS  . fluticasone  1 spray Each Nare BID  . gabapentin  600 mg Oral Daily  . gabapentin  900 mg Oral QHS  . insulin aspart  0-5 Units Subcutaneous QHS  . insulin aspart  0-9 Units Subcutaneous TID WC  . insulin aspart  5 Units Subcutaneous TID WC  . ipratropium  0.5 mg Nebulization Q6H  . multivitamin with minerals  1 tablet Oral Daily  . nicotine  14 mg Transdermal Daily  . ondansetron (ZOFRAN) IV  4 mg Intravenous Once  . pantoprazole  40 mg Oral QAC breakfast  . predniSONE  40 mg Oral Q breakfast   Infusions:  . cefTRIAXone (ROCEPHIN)  IV Stopped (01/30/20 1552)  . heparin 1,300 Units/hr (01/30/20 1752)   PRN:  benzonatate, dextromethorphan-guaiFENesin, ipratropium, metoprolol tartrate   Anti-infectives (From admission, onward)   Start     Dose/Rate Route Frequency Ordered Stop   01/30/20 1400  cefTRIAXone (ROCEPHIN) 1 g in sodium chloride 0.9 % 100 mL IVPB        1 g 200 mL/hr over 30 Minutes Intravenous Every 24 hours 01/30/20 1345     01/30/20 1400  azithromycin (ZITHROMAX) tablet 500 mg        500 mg Oral Daily 01/30/20 1345     01/30/20 1345  levofloxacin (LEVAQUIN) tablet 500 mg  Status:  Discontinued        500 mg Oral Daily 01/30/20 1331 01/30/20 1345      Assessment: 59 yo female presents with a COPD exacerbation but elevated troponin of 336 which increased to 1179. Per cardiology, elevated troponin seems to be due to demand ischemia and the patient is planned to undergo echocardiogram and overall ischemic evaluation once respiratory status improves. Pharmacy is consulted to dose heparin. PTA the patient is not on any anticoagulation.  9/1 AM update:  Heparin level therapeutic   Goal of Therapy:  Heparin level 0.3-0.7 units/ml Monitor platelets by anticoagulation protocol: Yes   Plan:  Cont  heparin 1300 units/hr Confirmatory heparin level with AM labs Monitor daily heparin level and CBC Monitor for signs and symptoms of bleeding  Abran Duke, PharmD, BCPS Clinical Pharmacist Phone: [phone]

## 2020-01-31 NOTE — Progress Notes (Signed)
Patient pressed call bell and stated "I can't breathe!" When writer arrived to room, patient was extremely anxious and asking for her breathing treatment. Began treatment and then saw oxygen sat was 79%. Rapid response was called and MD Nettey paged. Patient placed on non re-breather mask. Oxygen sat returned to 100% after a short time. During this episode, telemetry called saying patient was having runs of V-tach. EKG obtained and MD at beside to read results. Continuing to closely monitor. MD Nettey placed new order for lasix and order to transfer patient to another unit due to higher level of care need. Patient now progressive.

## 2020-01-31 NOTE — Consult Note (Signed)
NAME:  Brenda [name], MRN:  [id number], DOB:  Mar 28, 1961, LOS: 1 ADMISSION DATE:  01/30/2020, CONSULTATION DATE: 01/31/20 REFERRING MD:  Dr. Lonny Prude, CHIEF COMPLAINT:  SOB   Brief History   59 y/o F admitted with increased SOB, cough with yellow sputum production. Found to have acute hypoxic and hypercapnic respiratory failure.    History of present illness   59 y/o F who presented to [facility] on 8/31 with reports of 2-3 days of worsening shortness of breath and cough with yellow sputum production.    The patient attempted home nebulization for symptoms but it did not relieve dyspnea.  EMS evaluation found her to be hypoxic with saturations in the 80's while on BiPAP. She was treated with duoneb, magnesium and IV solumedrol in route to the ER. VBG on arrival to ER showed pH 7.14 and CO2 70.  She is not vaccinated for COVID.  COVID testing in ER negative. She was lethargic on arrival and placed on BiPAP.  After 2 hours of therapy, VBG improved to 7.35, PCO2 39.  WBC 15.2.  EKG showed sinus tachycardia. Initial CXR showed bibasilar interstitial prominence, borderline cardiomegaly.  The patient was admitted per [facility] for evaluation of acute respiratory failure.  She was noted to have elevated troponin (1,179 > 1,655 > 2,853).  Additionally, she had elevated LFT's.  Statin was stopped.  The patient was evaluated by Cardiology.    PCCM consulted for pulmonary evaluation.   Past Medical History  OSA - AHI 58 2016, desaturation to 64%, on CPAP  Severe Obstructive Lung Disease - PFT 09/2017 with FVC 1.64 (55%), FEV1 1.02 (44%), ratio 58.  Negative alpha-1 Chronic Hypoxic Respiratory Failure - 2L O2 dependent  Tobacco Abuse  Pancreatitis  Bipolar Disorder  HTN CKD  Cor Pulmonale  Diastolic Dysfunction  Hypertriglyceridemia  IDDM  Diabetic Neuropathy  Chronic Pain  Depression / Anxiety  Lumbar Disc Disease with Radiculopathy  BMI - 40.90  Significant Hospital Events   8/31 Admit  Consults:     Procedures:    Significant Diagnostic Tests:  ECHO 9/1 >>   Micro Data:  COVID 8/31 >> negative  Sputum 8/31 >>   Antimicrobials:  Ceftriaxone 8/31 >>  Azithromycin 8/31 >>    Interim history/subjective:  Afebrile  Pt reports she feels better but is not to baseline.  Daughter at bedside.   Objective   Blood pressure 102/66, pulse 71, temperature 97.9 F (36.6 C), temperature source Oral, resp. rate 16, height 5' 1.5" (1.562 m), weight 99.8 kg, last menstrual period 03/08/2013, SpO2 96 %.        Intake/Output Summary (Last 24 hours) at 01/31/2020 1333 Last data filed at 01/30/2020 1843 Gross per 24 hour  Intake 150.32 ml  Output --  Net 150.32 ml   Filed Weights   01/31/20 1100  Weight: 99.8 kg    Examination: General: adult female sitting up in bed in NAD HEENT: MM pink/moist, [place] O2 Neuro: AAOx4, speech clear, MAE  CV: s1s2 RRR, no m/r/g PULM: prolonged exp phase, wheezing bilaterally, bibasilar crackles  GI: soft, bsx4 active  Extremities: warm/dry, no edema  Skin: no rashes or lesions  Resolved Hospital Problem list      Assessment & Plan:   Acute on Chronic Hypoxic / Hypercapnic Respiratory Failure  -empiric rocephin / azithromycin  -CXR review shows cephalization on portable film, ? [place] -ensure QHS CPAP -assess RVP -assess ESR -assess CT chest to eval for fibrosis, active infectious process or effusions  Severe Obstructive Lung Disease with suspected Acute Exacerbation  -continue prednisone 40 mg QD x5 days  -change to brovana, pulmicort and yuperlri -change duoneb to PRN on 9/2, allow 24 hours overlap  OSA  -QHS CPAP  Tobacco Abuse  -smoking cessation counseling provided 9/1  NSTEMI  -heparin gtt per primary / pharmacy  -follow troponin  -consider ischemic work up once stabilized from respiratory standpoint  Elevated LFT's  Negative hepatitis panel.  [place] with steatosis.  -trend LFT's  -f/u CMV / EBV  Best practice:  Diet:  heart healthy   Pain/Anxiety/Delirium protocol (if indicated): n/a  VAP protocol (if indicated): n/a  DVT prophylaxis: heparin gtt GI prophylaxis: PPI  Glucose control: SSI, per primary  Mobility: as tolerated  Code Status: Full Code  Family Communication: Patient and daughter updated 9/1 on plan of care  Disposition: Per [facility]   Labs   CBC: Recent Labs  Lab 01/30/20 0955 01/30/20 1008 01/30/20 1207 01/31/20 0639  WBC 15.2*  --   --  17.4*  NEUTROABS 6.9  --   --   --   HGB 14.3 15.6* 16.0* 14.4  HCT 47.6* 46.0 47.0* 44.4  MCV 98.6  --   --  92.9  PLT 299  --   --  948    Basic Metabolic Panel: Recent Labs  Lab 01/30/20 0955 01/30/20 1008 01/30/20 1207 01/31/20 0639  NA 136 137 137 136  K 4.9 4.7 4.7 4.0  CL 98  --   --  101  CO2 24  --   --  22  GLUCOSE 455*  --   --  133*  BUN 17  --   --  29*  CREATININE 1.66*  --   --  1.57*  CALCIUM 9.1  --   --  9.3   GFR: Estimated Creatinine Clearance: 41.2 mL/min (A) (by C-G formula based on SCr of 1.57 mg/dL (H)). Recent Labs  Lab 01/30/20 0955 01/31/20 0639  WBC 15.2* 17.4*    Liver Function Tests: Recent Labs  Lab 01/30/20 0955 01/31/20 0639  AST 193* 1,344*  ALT 208* 1,577*  ALKPHOS 126 124  BILITOT 0.4 0.4  PROT 7.3 7.3  ALBUMIN 3.4* 3.6   No results for input(s): LIPASE, AMYLASE in the last 168 hours. No results for input(s): AMMONIA in the last 168 hours.  ABG    Component Value Date/Time   HCO3 22.3 01/30/2020 1207   TCO2 24 01/30/2020 1207   ACIDBASEDEF 3.0 (H) 01/30/2020 1207   O2SAT 63.0 01/30/2020 1207     Coagulation Profile: No results for input(s): INR, PROTIME in the last 168 hours.  Cardiac Enzymes: Recent Labs  Lab 01/30/20 1510  CKTOTAL 199    HbA1C: Hgb A1c MFr Bld  Date/Time Value Ref Range Status  01/30/2020 03:10 PM 6.2 (H) 4.8 - 5.6 % Final    Comment:    (NOTE) Pre diabetes:          5.7%-6.4%  Diabetes:              >6.4%  Glycemic control for    <7.0% adults with diabetes   07/31/2019 08:08 AM 6.4 4.6 - 6.5 % Final    Comment:    Glycemic Control Guidelines for People with Diabetes:Non Diabetic:  <6%Goal of Therapy: <7%Additional Action Suggested:  >8%     CBG: Recent Labs  Lab 01/30/20 1728 01/30/20 2134 01/31/20 0618 01/31/20 1113  GLUCAP 191* 167* 152* 110*    Review of Systems:  Positives in [place]   Gen: Denies fever, chills, weight change, fatigue, night sweats HEENT: Denies blurred vision, double vision, hearing loss, tinnitus, sinus congestion, rhinorrhea, sore throat, neck stiffness, dysphagia PULM: Denies shortness of breath, cough, sputum production, hemoptysis, wheezing CV: Denies chest pain, edema, orthopnea, paroxysmal nocturnal dyspnea, palpitations GI: Denies abdominal pain, nausea, vomiting, diarrhea, hematochezia, melena, constipation, change in bowel habits GU: Denies dysuria, hematuria, polyuria, oliguria, urethral discharge Endocrine: Denies hot or cold intolerance, polyuria, polyphagia or appetite change Derm: Denies rash, dry skin, scaling or peeling skin change Heme: Denies easy bruising, bleeding, bleeding gums Neuro: Denies headache, numbness, weakness, slurred speech, loss of memory or consciousness   Past Medical History  She,  has a past medical history of Acute pancreatitis, Asthma, Bipolar I disorder, most recent episode (or current) manic, unspecified, Bronchitis, chronic obstructive ([place]) (2008 & 2009), COPD (chronic obstructive pulmonary disease) ([place]), History of pyelonephritis (05/2012), HTN (hypertension), migraines, Knee pain, right, Leukocytosis, unspecified, Lumbar disc disease with radiculopathy, Nicotine addiction, Nonspecific abnormal electrocardiogram (ECG) (EKG), Obesity, Pure hyperglyceridemia, Suicide attempt ([place]) (06/2001), Swelling of limb, and Urge and stress incontinence (07/2012).   Surgical History    Past Surgical History:  Procedure Laterality Date  . CHOLECYSTECTOMY   07/1982  . CT MAXILLOFACIAL WO/W CM  06/14/06   Left max mucocele  . CT of chest  06/14/2006   Normal except c/w active inflammation / infection.  Left renal atrophy  . ERCP / sphincterotomy - stenosis  01/15/06  . lumbar MRI  11/2010   multilevel spondylitic changes with upper lumbar scoliosis and anterolisthesis of L4 on 5 with some L foraminal narrowing esp at L/3 with concavity of scoliosis, some central stenosis and biforaminal narrowing at L4/5 with spondylolisthesis  . [place] - Pneumonia, acute asthma, left maxillary sinusitis  01/11 - 06/18/2006  . Retained stone     ERCP secondary to retained stone  . Right knee surgery  1984,1989,1989,1991,1994   Reconstructions for ACL insuff  . TUBAL LIGATION       Social History   reports that she has been smoking cigarettes. She has smoked for the past 41.00 years. She has never used smokeless tobacco. She reports current alcohol use. She reports that she does not use drugs.   Family History   Her family history includes Asthma in her father; Breast cancer in her paternal grandmother; Heart disease in her brother and mother; Hyperlipidemia in her mother; Hypertension in her father and mother.   Allergies Allergies  Allergen Reactions  . Metolazone Other (See Comments)    Metabolic mood swings     Home Medications  Prior to Admission medications   Medication Sig Start Date End Date Taking? Authorizing Provider  albuterol (PROVENTIL) (2.5 MG/3ML) 0.083% nebulizer solution INHALE 1 VIAL VIA NEBULIZER EVERY 6 HOURS AS NEEDED FOR WHEEZING/SHORTNESS OF BREATH Patient taking differently: Take 2.5 mg by nebulization every 6 (six) hours as needed for wheezing or shortness of breath.  05/11/18  Yes Ria Bush, MD  atorvastatin (LIPITOR) 20 MG tablet Take 20 mg by mouth at bedtime. 01/01/20  Yes [provider]  benzonatate (TESSALON) 200 MG capsule Take 1 capsule (200 mg total) by mouth 3 (three) times daily as needed for cough.  Swallow whole, to not bite pill 04/22/18  Yes Parrett, Tammy S, NP  Budeson-Glycopyrrol-Formoterol (BREZTRI AEROSPHERE) 160-9-4.8 MCG/ACT AERO Inhale 2 puffs into the lungs 2 (two) times daily. 05/30/19  Yes Tanda Rockers, MD  [facility]an[facility] DM) 30-600 MG 12hr  tablet Take 1 tablet by mouth 2 (two) times daily as needed for cough.   Yes [provider]  escitalopram (LEXAPRO) 20 MG tablet TAKE 1 AND 1/2 TABLETS DAILY BY MOUTH Patient taking differently: Take 30 mg by mouth daily.  01/04/20  Yes Ria Bush, MD  famotidine (PEPCID) 20 MG tablet Take 20 mg by mouth at bedtime. 12/15/19  Yes [provider]  fluticasone (FLONASE) 50 MCG/ACT nasal spray Place 1 spray into both nostrils 2 (two) times daily. 05/30/19  Yes Tanda Rockers, MD  furosemide (LASIX) 20 MG tablet TAKE 2 TABLETS BY MOUTH EVERY DAY Patient taking differently: Take 40 mg by mouth daily.  09/25/19  Yes Tanda Rockers, MD  gabapentin (NEURONTIN) 300 MG capsule TAKE 2 TABLETS IN THE MORNING AND 3 TABLETS AT NIGHT Patient taking differently: Take 600-900 mg by mouth See admin instructions. TAKE 2 TABLETS IN THE MORNING AND 3 TABLETS AT NIGHT 09/24/19  Yes Ria Bush, MD  LORazepam (ATIVAN) 0.5 MG tablet TAKE 1/2 TO 1 TABLET BY MOUTH TWICE A DAY AS NEEDED FOR ANXIETY Patient taking differently: Take 0.25-0.5 mg by mouth 2 (two) times daily as needed for anxiety.  01/17/20  Yes Ria Bush, MD  metFORMIN (GLUCOPHAGE) 500 MG tablet TAKE 1 TABLET BY MOUTH EVERYDAY AT BEDTIME Patient taking differently: Take 500 mg by mouth daily with breakfast.  10/02/19  Yes Ria Bush, MD  Multiple Vitamin (MULTIVITAMIN WITH MINERALS) TABS tablet Take 1 tablet by mouth daily.   Yes [provider]  nystatin (MYCOSTATIN) 100000 UNIT/ML suspension TAKE 1 TEASPOONFUL BY MOUTH 3 TIMES DAILY AS NEEDED FOR MOUTH SORES. Patient taking differently: Take 5 mLs by mouth 3 (three) times daily as  needed (mouth sores).  04/22/18  Yes Parrett, Tammy S, NP  pantoprazole (PROTONIX) 40 MG tablet TAKE 1 TABLET BY MOUTH EVERY DAY 30 TO 60 MINUTES BEFORE THE FIRST MEAL OF THE DAY Patient taking differently: Take 40 mg by mouth daily.  06/10/18  Yes Tanda Rockers, MD  PROAIR RESPICLICK 106 (90 Base) MCG/ACT AEPB INHALE 2 PUFFS INTO THE LUNGS EVERY 6 HOURS AS NEEDED FOR DYSPNEA/WHEEZE Patient taking differently: Inhale 2 puffs into the lungs every 6 (six) hours as needed (wheezing or shortness of breath).  08/09/19  Yes Ria Bush, MD  promethazine-dextromethorphan (PROMETHAZINE-DM) 6.25-15 MG/5ML syrup Take 5 mLs by mouth 4 (four) times daily as needed for cough. 09/15/19  Yes Tanda Rockers, MD  spironolactone (ALDACTONE) 25 MG tablet TAKE 1 TABLET BY MOUTH TWICE A DAY Patient taking differently: Take 25 mg by mouth 2 (two) times daily.  01/17/20  Yes Tanda Rockers, MD  doxycycline (VIBRA-TABS) 100 MG tablet Take 1 tablet (100 mg total) by mouth 2 (two) times daily. Patient not taking: Reported on 01/30/2020 12/15/19   Parrett, Fonnie Mu, NP  oxyCODONE-acetaminophen (PERCOCET/ROXICET) 5-325 MG tablet Take 1 tablet by mouth every 8 (eight) hours as needed for severe pain. Patient not taking: Reported on 01/30/2020 09/26/19   Newt Minion, MD  predniSONE (DELTASONE) 10 MG tablet 4 tabs for 2 days, then 3 tabs for 2 days, 2 tabs for 2 days, then 1 tab for 2 days, then stop Patient not taking: Reported on 01/30/2020 12/15/19   Parrett, Fonnie Mu, NP     Critical care time: n/a     Noe Gens, MSN, NP-C [facility] 01/31/2020, 1:33 PM   Please see [email or web address] for pager details.

## 2020-01-31 NOTE — Progress Notes (Signed)
PROGRESS NOTE    Brenda [name]  IEP:[id number] DOB: 1961-03-03 DOA: 01/30/2020 PCP: [name], MD   Brief Narrative: Brenda [name] is a 59 y.o. female with medical history significant of chronic hypoxic respiratory failure on 2 to 3 L baseline O2 plus CPAP at bedtime, COPD Gold stage III (FEV1 44%, improved with bronchodilator to 50%, 2019), chronic cigarette smoker 5-10 cigarettes a day hypertension, OSA on CPAP at bedtime, IIDM, diabetic neuropathy, chronic pain, depression/anxiety, morbid obesity. Patient presented secondary severe dyspnea with concern for COPD exacerbation. Also found to have evidence of possible NSTEMI in addition to labs concerning for acute hepatitis.   Assessment & Plan:   Active Problems:   COPD exacerbation (HCC)   Acute respiratory failure with hypoxia (HCC)   COPD (chronic obstructive pulmonary disease) (HCC)   COPD exacerbation Chest x-ray suggests pneumonitis. Patient started empirically on Prednisone, Ceftriaxone and Azithromycin. Patient given BiPAP on admission and hs improved this morning but is far from baseline. Triggers include allergens but patient reports no other history of viral-like illness -Continue Duonebs, Prednisone -Continue Flonase -Continue Ceftriaxone/Azithromcin -Pulmonology consult  Acute on chronic respiratory failure with hypoxia Patient is on 2 L via [place] chronically at home. In setting of above. -Wean to baseline as able. Goals SpO2 >88%  Elevated troponin NSTEMI vs demand ischemia per cardiology assessment. Differential could also include myocarditis. Last troponin of 2853. -Repeat troponin pending -Cardiology recommendations: pending today; anticipate ischemic workup with LHC at some point this hospitalization  Leukocytosis Unsure of etiology but possible related to everything that is going on with her. Differential on admission significant for lymphocytosis and monocytosis. Possibly viral  etiology. -CMV/EBV -Daily CBC w/ Diff  AKI Baseline creatinine of 1.1. Creatinine of 1.66 on admission and is improved slightly this morning. Unknown etiology. -BMP in AM  Elevated AST/ALT Ultrasound significant for hepatic steatosis. No associated hyperbilirubinemia. Trending upwards. Hepatitis panel obtained and is negative. Patient is on Lipitor as an outpatient which could also be the etiology behind rise; no associated myalgias. No hypotension noted. -CMV/EBV  OSA -Continue CPAP qhs  Anxiety -continue Ativan prn  Diabetes mellitus, type 2 Patient is on metformin as an outpatient. Last hemoglobin A1C of 6.2% -Continue SSI  Diabetic neuropathy -Continue gabapentin; dose for renal function   DVT prophylaxis: Heparin drip Code Status:   Code Status: Full Code Family Communication: Daughter at bedside Disposition Plan: Discharge likely home in several days pending continued workup/management of respiratory/heart and liver disease   Consultants:   Cardiology  Procedures:   TRANSTHORACIC ECHOCARDIOGRAM (pending)  Antimicrobials:  Azithromycin  Ceftriaxone    Subjective: Breathing better than admission but not close to baseline.  Objective: Vitals:   01/30/20 2311 01/31/20 0054 01/31/20 0247 01/31/20 0619  BP:  (!) 97/58  105/76  Pulse: 90 83  89  Resp: 18 16  17   Temp:  98.7 F (37.1 C)  (!) 97.4 F (36.3 C)  TempSrc:  Oral  Oral  SpO2: 95% 94% 94% 96%    Intake/Output Summary (Last 24 hours) at 01/31/2020 0744 Last data filed at 01/30/2020 1843 Gross per 24 hour  Intake 150.32 ml  Output --  Net 150.32 ml   There were no vitals filed for this visit.  Examination:  General exam: Appears calm and comfortable Respiratory system: Mild wheezing but significantly decreased air movement. Respiratory effort normal. Cardiovascular system: S1 & S2 heard, RRR. No murmurs, rubs, gallops or clicks. Gastrointestinal system: Abdomen is nondistended, soft and  nontender. No organomegaly or masses felt. Normal bowel sounds heard. Central nervous system: Alert and oriented. No focal neurological deficits. Musculoskeletal: No edema. No calf tenderness Skin: No cyanosis. No rashes Psychiatry: Judgement and insight appear normal. Mood & affect appropriate.     Data Reviewed: I have personally reviewed following labs and imaging studies  CBC Lab Results  Component Value Date   WBC 17.4 (H) 01/31/2020   RBC 4.78 01/31/2020   HGB 14.4 01/31/2020   HCT 44.4 01/31/2020   MCV 92.9 01/31/2020   MCH 30.1 01/31/2020   PLT 159 01/31/2020   MCHC 32.4 01/31/2020   RDW 13.7 01/31/2020   LYMPHSABS 6.8 (H) 01/30/2020   MONOABS 1.1 (H) 01/30/2020   EOSABS 0.3 01/30/2020   BASOSABS 0.1 01/30/2020     Last metabolic panel Lab Results  Component Value Date   NA 137 01/30/2020   K 4.7 01/30/2020   CL 98 01/30/2020   CO2 24 01/30/2020   BUN 17 01/30/2020   CREATININE 1.66 (H) 01/30/2020   GLUCOSE 455 (H) 01/30/2020   GFRNONAA 33 (L) 01/30/2020   GFRAA 38 (L) 01/30/2020   CALCIUM 9.1 01/30/2020   PHOS 3.4 07/26/2015   PROT 7.3 01/30/2020   ALBUMIN 3.4 (L) 01/30/2020   BILITOT 0.4 01/30/2020   ALKPHOS 126 01/30/2020   AST 193 (H) 01/30/2020   ALT 208 (H) 01/30/2020   ANIONGAP 14 01/30/2020    CBG (last 3)  Recent Labs    01/30/20 1728 01/30/20 2134 01/31/20 0618  GLUCAP 191* 167* 152*     GFR: CrCl cannot be calculated (Unknown ideal weight.).  Coagulation Profile: No results for input(s): INR, PROTIME in the last 168 hours.  Recent Results (from the past 240 hour(s))  SARS Coronavirus 2 by RT PCR (hospital order, performed in [facility] hospital lab) Nasopharyngeal Nasopharyngeal Swab     Status: None   Collection Time: 01/30/20  9:52 AM   Specimen: Nasopharyngeal Swab  Result Value Ref Range Status   SARS Coronavirus 2 NEGATIVE NEGATIVE Final    Comment: (NOTE) SARS-CoV-2 target nucleic acids are NOT DETECTED.  The  SARS-CoV-2 RNA is generally detectable in upper and lower respiratory specimens during the acute phase of infection. The lowest concentration of SARS-CoV-2 viral copies this assay can detect is 250 copies / mL. A negative result does not preclude SARS-CoV-2 infection and should not be used as the sole basis for treatment or other patient management decisions.  A negative result may occur with improper specimen collection / handling, submission of specimen other than nasopharyngeal swab, presence of viral mutation(s) within the areas targeted by this assay, and inadequate number of viral copies (<250 copies / mL). A negative result must be combined with clinical observations, patient history, and epidemiological information.  Fact Sheet for Patients:   BoilerBrush.com.cy  Fact Sheet for Healthcare Providers: https://pope.com/  This test is not yet approved or  cleared by the [place] FDA and has been authorized for detection and/or diagnosis of SARS-CoV-2 by FDA under an Emergency Use Authorization (EUA).  This EUA will remain in effect (meaning this test can be used) for the duration of the COVID-19 declaration under Section 564(b)(1) of the Act, 21 U.S.C. section 360bbb-3(b)(1), unless the authorization is terminated or revoked sooner.  Performed at [facility], [address], [place], [place] [address]   Expectorated sputum assessment w rflx to resp cult     Status: None (Preliminary result)   Collection Time: 01/31/20  4:48 AM   Specimen: Expectorated Sputum  Result Value Ref Range Status   Specimen Description EXPECTORATED SPUTUM  Final   Special Requests NONE  Final   Sputum evaluation   Final    Sputum specimen not acceptable for testing.  Please recollect.   RESULT CALLED TO, READ BACK BY AND VERIFIED WITH: C GOAD 01/31/20 0557 JDW Performed at [facility], [address] [place], [place] [address]     Report Status PENDING  Incomplete        Radiology Studies: DG Chest Portable 1 View  Result Date: 01/30/2020 CLINICAL DATA:  Shortness of breath.  Potential COVID. EXAM: PORTABLE CHEST 1 VIEW COMPARISON:  05/30/2019. FINDINGS: Borderline cardiomegaly. No pulmonary venous congestion. Diffuse prominent bilateral interstitial prominence itis. No pleural effusion or pneumothorax. IMPRESSION: 1. Diffuse prominent bilateral interstitial prominence consistent with pneumonitis. 2.  Borderline cardiomegaly.  No pulmonary venous congestion. Electronically Signed   By: Maisie Fus  Register   On: 01/30/2020 11:09   US Abdomen Limited RUQ  Result Date: 01/30/2020 CLINICAL DATA:  Elevated liver enzymes. EXAM: ULTRASOUND ABDOMEN LIMITED RIGHT UPPER QUADRANT COMPARISON:  01/11/2006 abdominal ultrasound. FINDINGS: Gallbladder: Surgically absent. Common bile duct: Diameter: 2.4 mm Liver: No focal lesion identified. Increased parenchymal echogenicity. Portal vein is patent on color Doppler imaging with normal direction of blood flow towards the liver. Other: None. IMPRESSION: Hepatic steatosis.  No focal hepatic lesions. Sequela of cholecystectomy. Electronically Signed   By: Stana Bunting M.D.   On: 01/30/2020 16:50        Scheduled Meds: . aspirin EC  81 mg Oral Daily  . azithromycin  500 mg Oral Daily  . escitalopram  10 mg Oral QHS  . fluticasone  1 spray Each Nare BID  . gabapentin  600 mg Oral Daily  . gabapentin  900 mg Oral QHS  . insulin aspart  0-5 Units Subcutaneous QHS  . insulin aspart  0-9 Units Subcutaneous TID WC  . insulin aspart  5 Units Subcutaneous TID WC  . ipratropium  0.5 mg Nebulization Q6H  . multivitamin with minerals  1 tablet Oral Daily  . nicotine  14 mg Transdermal Daily  . ondansetron (ZOFRAN) IV  4 mg Intravenous Once  . pantoprazole  40 mg Oral QAC breakfast  . predniSONE  40 mg Oral Q breakfast   Continuous Infusions: . cefTRIAXone (ROCEPHIN)  IV Stopped  (01/30/20 1552)  . heparin 1,300 Units/hr (01/30/20 1752)     LOS: 1 day     Jacquelin Hawking, MD [facility] 01/31/2020, 7:44 AM  If 7PM-7AM, please contact night-coverage [email or web address]

## 2020-01-31 NOTE — Evaluation (Addendum)
Occupational Therapy Evaluation Patient Details Name: Brenda [name] MRN: [id number] DOB: 1960/12/29 Today's Date: 01/31/2020    History of Present Illness  Brenda [name] is a 59 y.o. female with medical history significant of chronic hypoxic respiratory failure on 2 to 3 L baseline O2 plus CPAP at bedtime, COPD Gold stage III (FEV1 44%, improved with bronchodilator to 50%, 2019), chronic cigarette smoker 5-10 cigarettes a day hypertension, OSA on CPAP at bedtime, IIDM, diabetic neuropathy, chronic pain, depression/anxiety, morbid obesity, presented with increasing short of breath. Pt also with elevated troponins, EKG negative for acute changes.    Clinical Impression   PTA, pt lives with family and reports Independence with ADLs, IADLs and driving. Pt cares for 65 year old grandson while daughter & son in law work. Pt wears 2 L O2 baseline and does not use AD for mobility. Pt presents now with diagnoses above and deficits listed below. Pt received on 3 L O2, desats to 88% for ADLs in standing in room, but recovers quickly with seated rest break and PLB. Pt min guard at most for LB ADLs and mobility to ensure safety and steadiness. Educated pt on energy conservation strategies at home with pt already implementing many techniques. Pt anxious today, but denies any chest pain. Pt endorses dyspnea during activity. Pt would benefit from skilled OT services at acute level, but do not anticipate any OT needs at DC.    Follow Up Recommendations  No OT follow up    Equipment Recommendations  None recommended by OT    Recommendations for Other Services       Precautions / Restrictions Precautions Precautions: Fall;Other (comment) Precaution Comments: monitor O2, low fall Restrictions Weight Bearing Restrictions: No      Mobility Bed Mobility Overal bed mobility: Modified Independent                Transfers Overall transfer level: Needs assistance Equipment used: None Transfers: Sit  to/from Stand;Stand Pivot Transfers Sit to Stand: Supervision Stand pivot transfers: Min guard       General transfer comment: Supervision for sit to stand, min guard for dynamic tasks due to mild unsteadiness. Pt improved with use of IV pole for support    Balance Overall balance assessment: Needs assistance Sitting-balance support: No upper extremity supported;Feet supported Sitting balance-Leahy Scale: Good     Standing balance support: No upper extremity supported;During functional activity Standing balance-Leahy Scale: Good                             ADL either performed or assessed with clinical judgement   ADL Overall ADL's : Needs assistance/impaired Eating/Feeding: Independent;Sitting   Grooming: Supervision/safety;Standing;Oral care;Brushing hair Grooming Details (indicate cue type and reason): Supervision for standing at sink for grooming tasks Upper Body Bathing: Independent;Sitting   Lower Body Bathing: Min guard;Sit to/from stand   Upper Body Dressing : Supervision/safety;Sitting   Lower Body Dressing: Min guard;Sit to/from stand   Toilet Transfer: Min guard;Ambulation;BSC Toilet Transfer Details (indicate cue type and reason): simulated in room Toileting- Clothing Manipulation and Hygiene: Min guard;Sit to/from stand       Functional mobility during ADLs: Min guard General ADL Comments: Pt with decreased cardiopulmonary tolerance and anxiety impacting ability to complete ADLs, IADLs     Vision Patient Visual Report: No change from baseline Vision Assessment?: No apparent visual deficits     Perception     Praxis  Pertinent Vitals/Pain Pain Assessment: Faces Faces Pain Scale: Hurts a little bit Pain Location: head Pain Descriptors / Indicators: Headache Pain Intervention(s): Monitored during session     Hand Dominance Right   Extremity/Trunk Assessment Upper Extremity Assessment Upper Extremity Assessment: Overall WFL for  tasks assessed   Lower Extremity Assessment Lower Extremity Assessment: Defer to PT evaluation   Cervical / Trunk Assessment Cervical / Trunk Assessment: Normal   Communication Communication Communication: No difficulties   Cognition Arousal/Alertness: Awake/alert Behavior During Therapy: Anxious Overall Cognitive Status: Within Functional Limits for tasks assessed                                 General Comments: Pleasant, A&Ox4. Anxious throughout but improved with calm education and redirection   General Comments  Checked with RN prior to eval with EKG suggesting demand ischemia rather than NSTEMI per chart review. High sensitivity troponins around 1,600 on initial chart review this AM, noted to be higher at time of charting pt information. Pt denies chest pain. Pt received on 3 L O2 (wears 2 L O2 baseline) with SpO2 WFL. During activity, pt with some SOB, SpO2 88% on 3 L o2 per therapist pulse ox (84% on monitor pulse ox). With seated rest break and cues for pursed lip breathing, pt returned to 95%.      Exercises     Shoulder Instructions      Home Living Family/patient expects to be discharged to:: Private residence Living Arrangements: Children;Other relatives (daughter, son in law, 2 y/o grandson) Available Help at Discharge: Family;Available PRN/intermittently (children work) Type of Home: House Home Access: Stairs to enter [employer] of Steps: 5 to deck + 1 additional step Entrance Stairs-Rails: Right;Left Home Layout: One level     Bathroom Shower/Tub: Walk-in shower;Tub only   Bathroom Toilet: Standard     Home Equipment: Cane - single point;Shower seat          Prior Functioning/Environment Level of Independence: Independent        Comments: Independent with ADLs, IADLs in the home without AD. Pt was driving. Cares for grandson while daughter at work.        OT Problem List: Decreased activity tolerance;Impaired balance  (sitting and/or standing);Cardiopulmonary status limiting activity      OT Treatment/Interventions: Self-care/ADL training;Therapeutic exercise;Energy conservation;DME and/or AE instruction;Therapeutic activities;Patient/family education    OT Goals(Current goals can be found in the care plan section) Acute Rehab OT Goals Patient Stated Goal: feel better and go home OT Goal Formulation: With patient Time For Goal Achievement: 02/14/20 Potential to Achieve Goals: Good  OT Frequency: Min 2X/week   Barriers to D/C:            Co-evaluation              AM-PAC OT "6 Clicks" Daily Activity     Outcome Measure Help from another person eating meals?: None Help from another person taking care of personal grooming?: A Little Help from another person toileting, which includes using toliet, bedpan, or urinal?: A Little Help from another person bathing (including washing, rinsing, drying)?: A Little Help from another person to put on and taking off regular upper body clothing?: A Little Help from another person to put on and taking off regular lower body clothing?: A Little 6 Click Score: 19   End of Session Equipment Utilized During Treatment: Oxygen Nurse Communication: Mobility status;Other (comment) (SpO2)  Activity Tolerance:  Patient tolerated treatment well;Other (comment) (Minor limitations due to anxiety) Patient left: in bed;with call bell/phone within reach  OT Visit Diagnosis: Unsteadiness on feet (R26.81);Other (comment) (decreased cardiopulmonary tolerance)                Time: 0998-3382 OT Time Calculation (min): 32 min Charges:  OT General Charges $OT Visit: 1 Visit OT Evaluation $OT Eval Moderate Complexity: 1 Mod OT Treatments $Self Care/Home Management : 8-22 mins  Lorre Munroe, OTR/L  Lorre Munroe 01/31/2020, 10:15 AM

## 2020-01-31 NOTE — Progress Notes (Signed)
  Echocardiogram 2D Echocardiogram has been performed.  Celene Skeen 01/31/2020, 2:17 PM

## 2020-02-01 DIAGNOSIS — I5020 Unspecified systolic (congestive) heart failure: Secondary | ICD-10-CM

## 2020-02-01 DIAGNOSIS — I471 Supraventricular tachycardia: Secondary | ICD-10-CM

## 2020-02-01 LAB — CBC WITH DIFFERENTIAL/PLATELET
Abs Immature Granulocytes: 0.09 10*3/uL — ABNORMAL HIGH (ref 0.00–0.07)
Basophils Absolute: 0 10*3/uL (ref 0.0–0.1)
Basophils Relative: 0 %
Eosinophils Absolute: 0 10*3/uL (ref 0.0–0.5)
Eosinophils Relative: 0 %
HCT: 41.2 % (ref 36.0–46.0)
Hemoglobin: 13.5 g/dL (ref 12.0–15.0)
Immature Granulocytes: 1 %
Lymphocytes Relative: 12 %
Lymphs Abs: 2.4 10*3/uL (ref 0.7–4.0)
MCH: 30.5 pg (ref 26.0–34.0)
MCHC: 32.8 g/dL (ref 30.0–36.0)
MCV: 93.2 fL (ref 80.0–100.0)
Monocytes Absolute: 1.1 10*3/uL — ABNORMAL HIGH (ref 0.1–1.0)
Monocytes Relative: 5 %
Neutro Abs: 16 10*3/uL — ABNORMAL HIGH (ref 1.7–7.7)
Neutrophils Relative %: 82 %
Platelets: 151 10*3/uL (ref 150–400)
RBC: 4.42 MIL/uL (ref 3.87–5.11)
RDW: 13.6 % (ref 11.5–15.5)
WBC: 19.7 10*3/uL — ABNORMAL HIGH (ref 4.0–10.5)
nRBC: 0 % (ref 0.0–0.2)

## 2020-02-01 LAB — GLUCOSE, CAPILLARY
Glucose-Capillary: 121 mg/dL — ABNORMAL HIGH (ref 70–99)
Glucose-Capillary: 115 mg/dL — ABNORMAL HIGH (ref 70–99)
Glucose-Capillary: 141 mg/dL — ABNORMAL HIGH (ref 70–99)
Glucose-Capillary: 148 mg/dL — ABNORMAL HIGH (ref 70–99)

## 2020-02-01 LAB — COMPREHENSIVE METABOLIC PANEL
ALT: 1471 U/L — ABNORMAL HIGH (ref 0–44)
AST: 718 U/L — ABNORMAL HIGH (ref 15–41)
Albumin: 3.5 g/dL (ref 3.5–5.0)
Alkaline Phosphatase: 100 U/L (ref 38–126)
Anion gap: 11 (ref 5–15)
BUN: 35 mg/dL — ABNORMAL HIGH (ref 8–23)
CO2: 25 mmol/L (ref 22–32)
Calcium: 9.1 mg/dL (ref 8.9–10.3)
Chloride: 102 mmol/L (ref 98–111)
Creatinine, Ser: 1.49 mg/dL — ABNORMAL HIGH (ref 0.44–1.00)
GFR calc Af Amer: 43 mL/min — ABNORMAL LOW (ref >60)
GFR calc non Af Amer: 38 mL/min — ABNORMAL LOW (ref >60)
Glucose, Bld: 118 mg/dL — ABNORMAL HIGH (ref 70–99)
Potassium: 4.2 mmol/L (ref 3.5–5.1)
Sodium: 138 mmol/L (ref 135–145)
Total Bilirubin: 0.7 mg/dL (ref 0.3–1.2)
Total Protein: 6.9 g/dL (ref 6.5–8.1)

## 2020-02-01 LAB — SEDIMENTATION RATE: Sed Rate: 39 mm/hr — ABNORMAL HIGH (ref 0–22)

## 2020-02-01 LAB — TROPONIN I (HIGH SENSITIVITY): Troponin I (High Sensitivity): 2570 ng/L (ref <18)

## 2020-02-01 LAB — CMV IGM: CMV IgM: <30 AU/mL (ref 0.0–29.9)

## 2020-02-01 LAB — EPSTEIN-BARR VIRUS VCA, IGG: EBV VCA IgG: >600 U/mL — ABNORMAL HIGH (ref 0.0–17.9)

## 2020-02-01 LAB — C-REACTIVE PROTEIN: CRP: 1.7 mg/dL — ABNORMAL HIGH (ref <1.0)

## 2020-02-01 LAB — MRSA PCR SCREENING: MRSA by PCR: NEGATIVE

## 2020-02-01 LAB — HEPARIN LEVEL (UNFRACTIONATED): Heparin Unfractionated: 0.63 IU/mL (ref 0.30–0.70)

## 2020-02-01 LAB — EPSTEIN-BARR VIRUS VCA, IGM: EBV VCA IgM: <36 U/mL (ref 0.0–35.9)

## 2020-02-01 MED ORDER — ACETAMINOPHEN 325 MG PO TABS
650.0000 mg | ORAL_TABLET | Freq: Four times a day (QID) | ORAL | Status: DC | PRN
  Administered 2020-02-01 – 2020-02-04 (×5): 650 mg via ORAL
  Filled 2020-02-01 (×5): qty 2

## 2020-02-01 MED ORDER — METOPROLOL SUCCINATE ER 25 MG PO TB24
12.5000 mg | ORAL_TABLET | Freq: Every day | ORAL | Status: DC
  Administered 2020-02-01 – 2020-02-04 (×4): 12.5 mg via ORAL
  Filled 2020-02-01 (×4): qty 1

## 2020-02-01 MED ORDER — BENZONATATE 100 MG PO CAPS: 200.0000 mg | ORAL_CAPSULE | Freq: Three times a day (TID) | ORAL | Status: DC | PRN

## 2020-02-01 NOTE — Progress Notes (Signed)
Patient requesting Tylenol for a headache, does not have PRN order.  [facility] paged.

## 2020-02-01 NOTE — Progress Notes (Signed)
SLP Cancellation Note  Patient Details Name: YARITHZA MINK MRN: [id number] DOB: Feb 15, 1961   Cancelled treatment:       Reason Eval/Treat Not Completed: Medical issues which prohibited therapy (Pt is currently on bi-PAP. SLP will follow up )  Vaibhav Fogleman I. Vear Clock, MS, CCC-SLP Acute Rehabilitation Services Office number [phone] Pager [phone]  Scheryl Marten 02/01/2020, 5:06 PM

## 2020-02-01 NOTE — Progress Notes (Signed)
ANTICOAGULATION CONSULT NOTE - Follow Up Consult  Pharmacy Consult for Heparin Indication: chest pain/ACS  Allergies  Allergen Reactions  . Metolazone Other (See Comments)    Metabolic mood swings    Patient Measurements: Height: 5' 1.5" (156.2 cm) Weight: 100.9 kg (222 lb 7.1 oz) IBW/kg (Calculated) : 48.95 Heparin Dosing Weight: 73 kg  Vital Signs: Temp: 97.7 F (36.5 C) (09/02 0837) Temp Source: Axillary (09/02 0837) BP: 111/82 (09/02 0837) Pulse Rate: 96 (09/02 0837)  Labs: Recent Labs    01/30/20 0955 01/30/20 1008 01/30/20 1206 01/30/20 1207 01/30/20 1207 01/30/20 1510 01/30/20 2126 01/31/20 0217 01/31/20 0639 01/31/20 1833 02/01/20 0352  HGB 14.3   < >  --  16.0*   < >  --   --   --  14.4  --  13.5  HCT 47.6*   < >  --  47.0*  --   --   --   --  44.4  --  41.2  PLT 299  --   --   --   --   --   --   --  159  --  151  HEPARINUNFRC  --   --   --   --   --   --  0.46 0.40  --   --  0.63  CREATININE 1.66*  --   --   --   --   --   --   --  1.57*  --  1.49*  CKTOTAL  --   --   --   --   --  199  --   --   --   --   --   TROPONINIHS 336*   < >   < >  --   --  1,423*  --   --  2,853* 3,204*  --    < > = values in this interval not displayed.    Estimated Creatinine Clearance: 43.7 mL/min (A) (by C-G formula based on SCr of 1.49 mg/dL (H)).  Assessment:  59 yo female presented with a COPD exacerbation and elevated troponins. NSTEMI vs demand ischemia. Pharmacy consulted to dose heparin. PTA the patient is not on any anticoagulation.  Heparin level remains therapeutic this morning, pltc low but now stable, H/H wnl.   Goal of Therapy:  Heparin level 0.3-0.7 units/ml Monitor platelets by anticoagulation protocol: Yes   Plan:  -Continue heparin 1300 units/h  -Per cardiology - will continue heparin x48h total for medical management, stop times placed for tonight   Fredonia Highland, PharmD, BCPS Clinical Pharmacist [phone] Please check AMION for all [facility]  Pharmacy numbers 02/01/2020

## 2020-02-01 NOTE — Progress Notes (Addendum)
Progress Note  Patient Name: Brenda [name] Date of Encounter: 02/01/2020  [facility] Cardiologist: Reatha Harps, MD   Subjective   Rapid response called yesterday evening for worsening respiratory distress and tachycardia that occurred after she got up and walked to and from the restroom. Anxiety was felt to be playing a role. EKG at the time showed SVT vs sinus tachycardia, rate 152, with new incomplete RBBB which was not seen on prior EKG but no acute ischemic changes. Placed on NRB with improvement and then ultimately placed on BiPAP. She was also given one dose of IV Lasix .   Patient still on BiPAP at this time and feels like she is breathing better. Denies any chest pain. No palpitations. Still has a cough which is sometimes productive.  Inpatient Medications    Scheduled Meds: . arformoterol  15 mcg Nebulization BID  . aspirin EC  81 mg Oral Daily  . azithromycin  500 mg Oral Daily  . budesonide (PULMICORT) nebulizer solution  0.5 mg Nebulization BID  . escitalopram  30 mg Oral Daily  . feeding supplement (GLUCERNA SHAKE)  237 mL Oral BID BM  . fluticasone  1 spray Each Nare BID  . gabapentin  600 mg Oral Daily  . gabapentin  900 mg Oral QHS  . insulin aspart  0-5 Units Subcutaneous QHS  . insulin aspart  0-9 Units Subcutaneous TID WC  . insulin aspart  5 Units Subcutaneous TID WC  . ipratropium-albuterol  3 mL Nebulization Q6H  . multivitamin with minerals  1 tablet Oral Daily  . nicotine  14 mg Transdermal Daily  . ondansetron (ZOFRAN) IV  4 mg Intravenous Once  . pantoprazole  40 mg Oral QAC breakfast  . predniSONE  40 mg Oral Q breakfast  . revefenacin  175 mcg Nebulization Daily   Continuous Infusions: . cefTRIAXone (ROCEPHIN)  IV 1 g (01/31/20 1405)  . heparin 1,300 Units/hr (02/01/20 0424)   PRN Meds: albuterol, benzonatate, dextromethorphan-guaiFENesin, LORazepam, metoprolol tartrate   Vital Signs    Vitals:   02/01/20 0000 02/01/20 0420  02/01/20 0459 02/01/20 0500  BP: 97/71 103/75    Pulse: 88 84    Resp: 19 20    Temp: 97.6 F (36.4 C) 97.8 F (36.6 C)    TempSrc: Axillary Axillary    SpO2: 100% 100% 100%   Weight:    100.9 kg  Height:        Intake/Output Summary (Last 24 hours) at 02/01/2020 0646 Last data filed at 02/01/2020 0600 Gross per 24 hour  Intake 456.81 ml  Output 900 ml  Net -443.19 ml   Last 3 Weights 02/01/2020 01/31/2020 11/13/2019  Weight (lbs) 222 lb 7.1 oz 220 lb 216 lb  Weight (kg) 100.9 kg 99.791 kg 97.977 kg      Telemetry    Sinus rhythm with rates mostly in the 80's to 90's, sometimes in the 100's to 110's. During episode of worsening respiratory distress/anxiety last night, went into SVT vs sinus tachycardia with rates in the 150's. - Personally Reviewed  ECG    No new ECG tracing today. EKG last night showed  sinus tachycardia, rate 152, with new incomplete RBBB which was not seen on prior EKG but no acute ischemic changes. - Personally Reviewed  Physical Exam   GEN: Morbidly obese Caucasian female on BiPAP but no acute distress.   Neck: No JVD Cardiac: RRR. No significant murmurs, rubs, or gallops appreciated. Respiratory: On BiPAP. Decreased breath  sounds throughout. Some rhonchi but improved from yesterday. GI: Soft, non-tender, non-distended  MS: No lower extremity edema. No deformity. Neuro:  No focal deficits. Psych: Normal affect. Responds appropriately.  Labs    High Sensitivity Troponin:   Recent Labs  Lab 01/30/20 0955 01/30/20 1206 01/30/20 1510 01/31/20 0639 01/31/20 1833  TROPONINIHS 336* 1,179* 1,655* 2,853* 3,204*      Chemistry Recent Labs  Lab 01/30/20 0955 01/30/20 1008 01/30/20 1207 01/31/20 0639 02/01/20 0352  NA 136   < > 137 136 138  K 4.9   < > 4.7 4.0 4.2  CL 98  --   --  101 102  CO2 24  --   --  22 25  GLUCOSE 455*  --   --  133* 118*  BUN 17  --   --  29* 35*  CREATININE 1.66*  --   --  1.57* 1.49*  CALCIUM 9.1  --   --  9.3 9.1    PROT 7.3  --   --  7.3 6.9  ALBUMIN 3.4*  --   --  3.6 3.5  AST 193*  --   --  1,344* 718*  ALT 208*  --   --  1,577* 1,471*  ALKPHOS 126  --   --  124 100  BILITOT 0.4  --   --  0.4 0.7  GFRNONAA 33*  --   --  35* 38*  GFRAA 38*  --   --  41* 43*  ANIONGAP 14  --   --  13 11   < > = values in this interval not displayed.     Hematology Recent Labs  Lab 01/30/20 0955 01/30/20 1008 01/30/20 1207 01/31/20 0639 02/01/20 0352  WBC 15.2*  --   --  17.4* 19.7*  RBC 4.83  --   --  4.78 4.42  HGB 14.3   < > 16.0* 14.4 13.5  HCT 47.6*   < > 47.0* 44.4 41.2  MCV 98.6  --   --  92.9 93.2  MCH 29.6  --   --  30.1 30.5  MCHC 30.0  --   --  32.4 32.8  RDW 14.0  --   --  13.7 13.6  PLT 299  --   --  159 151   < > = values in this interval not displayed.    BNP Recent Labs  Lab 01/30/20 0955  BNP 47.7     DDimer No results for input(s): DDIMER in the last 168 hours.   Radiology    CT CHEST WO CONTRAST  Result Date: 01/31/2020 CLINICAL DATA:  Respiratory distress, COPD exacerbation EXAM: CT CHEST WITHOUT CONTRAST TECHNIQUE: Multidetector CT imaging of the chest was performed following the standard protocol without IV contrast. COMPARISON:  Radiograph 01/30/2020, CT 09/07/2017 FINDINGS: Cardiovascular: Normal cardiac size. No pericardial effusion. Few scattered coronary artery calcifications are present. Atherosclerotic calcifications present in the normal caliber thoracic aorta and proximal great vessels which branch normally from the aortic arch. No periaortic stranding or hemorrhage. Central pulmonary arteries are normal caliber. Luminal evaluation precluded in the absence of contrast media. No major venous abnormalities are seen. Mediastinum/Nodes: No mediastinal fluid or gas. Normal thyroid gland and thoracic inlet. No acute abnormality of the trachea or esophagus. No worrisome mediastinal or axillary adenopathy.Few prominent paratracheal nodes are present but not significantly  changed from prior including a 9 mm lymph node with preserved fatty hilum (3/20). Hilar nodal evaluation is limited in the absence  of intravenous contrast media. Lungs/Pleura: There is diffuse mild airways thickening. Mixed ground-glass, consolidation and tree-in-bud nodularity is present predominantly in the right lower lobe and posterior segment right upper lobe to a lesser extent. Some mild interlobular septal and fissural thickening is noted as well with trace bilateral effusions. Central pulmonary vascular congestion is present with redistributed pulmonary vascularity. Additional bandlike areas of opacity may reflect atelectatic change or scarring. No pneumothorax. Few scattered calcified granulomata. No concerning pulmonary nodules or masses within the limitations of parenchymal disease though follow-up to resolution should be considered. Upper Abdomen: Patient appears to be post cholecystectomy. Interposition of the hepatic flexure anterior to the liver. Stable lobular thickening of the left adrenal gland. Musculoskeletal: Multilevel degenerative changes are present in the imaged portions of the spine. Some sclerotic likely Modic type endplate changes noted throughout the mid to lower thoracic levels. No acute or concerning osseous lesions. No worrisome chest wall masses or lesions. Mild body wall edema is noted. IMPRESSION: 1. Mixed ground-glass, consolidation and tree-in-bud nodularity predominantly in the right lower lobe and to a lesser extent the posterior segment right upper lobe. Findings could reflect an acute multifocal infection or inflammatory process or sequela of aspiration. 2. Additional superimposed features of septal thickening and vascular redistribution/congestion with trace effusions may suggest some superimposed edematous change as well. 3. Mild body wall edema. 4. Aortic Atherosclerosis (ICD10-I70.0). 5. Coronary artery calcifications are present. Please note that the presence of coronary  artery calcium documents the presence of coronary artery disease, the severity of this disease and any potential stenosis cannot be assessed on this non-gated CT examination. Electronically Signed   By: Kreg Shropshire M.D.   On: 01/31/2020 23:56   DG Chest Portable 1 View  Result Date: 01/30/2020 CLINICAL DATA:  Shortness of breath.  Potential COVID. EXAM: PORTABLE CHEST 1 VIEW COMPARISON:  05/30/2019. FINDINGS: Borderline cardiomegaly. No pulmonary venous congestion. Diffuse prominent bilateral interstitial prominence itis. No pleural effusion or pneumothorax. IMPRESSION: 1. Diffuse prominent bilateral interstitial prominence consistent with pneumonitis. 2.  Borderline cardiomegaly.  No pulmonary venous congestion. Electronically Signed   By: Maisie Fus  Register   On: 01/30/2020 11:09   ECHOCARDIOGRAM COMPLETE  Result Date: 01/31/2020    ECHOCARDIOGRAM REPORT   Patient Name:   MARTITA BRUMM Date of Exam: 01/31/2020 Medical Rec #:  [id number]     Height:       61.0 in Accession #:    [id number]    Weight:       216.0 lb Date of Birth:  May 25, 1961     BSA:          1.952 m Patient Age:    59 years      BP:           102/66 mmHg Patient Gender: F             HR:           71 bpm. Exam Location:  Inpatient Procedure: 2D Echo Indications:    dyspnea  History:        Patient has no prior history of Echocardiogram examinations.                 COPD; Risk Factors:Hypertension and Current Smoker.  Sonographer:    Celene Skeen RDCS (AE) Referring Phys: [id number] [facility]  Sonographer Comments: Image acquisition challenging due to patient body habitus, Image acquisition challenging due to respiratory motion and Image acquisition challenging due to COPD. IMPRESSIONS  1. Technically  difficult study, even with Definity contrast. There is no left ventricular thrombus. There is relatively preserved systolic function in the basal segments. Findings suggest stress cardiomyopathy (takotsubo sd.), but could well represent  multivessel CAD. Left ventricular ejection fraction, by estimation, is 30 to 35%. The left ventricle has moderate to severely decreased function. The left ventricle demonstrates global hypokinesis. The left ventricular internal cavity size was mildly dilated. Left ventricular diastolic parameters are consistent with Grade II diastolic dysfunction (pseudonormalization). Elevated left atrial pressure.  2. Right ventricular systolic function was not well visualized. The right ventricular size is normal. Tricuspid regurgitation signal is inadequate for assessing PA pressure.  3. The mitral valve is grossly normal. No evidence of mitral valve regurgitation. No evidence of mitral stenosis.  4. The aortic valve is grossly normal. Aortic valve regurgitation is not visualized. No aortic stenosis is present. Comparison(s): Prior images reviewed side by side. The left ventricular function is significantly worse. The left ventricular wall motion abnormalities are new. Consider serial studies, with Definity. FINDINGS  Left Ventricle: Technically difficult study, even with Definity contrast. There is no left ventricular thrombus. There is relatively preserved systolic function in the basal segments. Findings suggest stress cardiomyopathy (takotsubo sd.), but could well represent multivessel CAD. Left ventricular ejection fraction, by estimation, is 30 to 35%. The left ventricle has moderate to severely decreased function. The left ventricle demonstrates global hypokinesis. Definity contrast agent was given IV to delineate the left ventricular endocardial borders. The left ventricular internal cavity size was mildly dilated. There is no left ventricular hypertrophy. Left ventricular diastolic parameters are consistent with Grade II diastolic dysfunction (pseudonormalization). Elevated left atrial pressure. Right Ventricle: The right ventricular size is normal. No increase in right ventricular wall thickness. Right ventricular  systolic function was not well visualized. Tricuspid regurgitation signal is inadequate for assessing PA pressure. Left Atrium: Left atrial size was normal in size. Right Atrium: Right atrial size was normal in size. Pericardium: There is no evidence of pericardial effusion. Mitral Valve: The mitral valve is grossly normal. No evidence of mitral valve regurgitation. No evidence of mitral valve stenosis. Tricuspid Valve: The tricuspid valve is normal in structure. Tricuspid valve regurgitation is not demonstrated. Aortic Valve: The aortic valve is grossly normal. Aortic valve regurgitation is not visualized. No aortic stenosis is present. Pulmonic Valve: The pulmonic valve was not well visualized. Pulmonic valve regurgitation is not visualized. Aorta: The aortic root is normal in size and structure. IAS/Shunts: No atrial level shunt detected by color flow Doppler.  LEFT VENTRICLE PLAX 2D LVIDd:         5.50 cm  Diastology LVIDs:         4.20 cm  LV e' lateral:   7.51 cm/s LV PW:         0.90 cm  LV E/e' lateral: 11.5 LV IVS:        1.10 cm LVOT diam:     2.40 cm LV SV:         80 LV SV Index:   41 LVOT Area:     4.52 cm  LEFT ATRIUM           Index LA diam:      3.70 cm 1.90 cm/m LA Vol (A4C): 47.7 ml 24.44 ml/m  AORTIC VALVE LVOT Vmax:   96.60 cm/s LVOT Vmean:  70.600 cm/s LVOT VTI:    0.176 m  AORTA Ao Root diam: 2.70 cm MITRAL VALVE MV Area (PHT): 3.54 cm    SHUNTS  MV Decel Time: 214 msec    Systemic VTI:  0.18 m MV E velocity: 86.50 cm/s  Systemic Diam: 2.40 cm MV A velocity: 72.00 cm/s MV E/A ratio:  1.20 Mihai Croitoru MD Electronically signed by Thurmon Fair MD Signature Date/Time: 01/31/2020/4:22:37 PM    Final    US Abdomen Limited RUQ  Result Date: 01/30/2020 CLINICAL DATA:  Elevated liver enzymes. EXAM: ULTRASOUND ABDOMEN LIMITED RIGHT UPPER QUADRANT COMPARISON:  01/11/2006 abdominal ultrasound. FINDINGS: Gallbladder: Surgically absent. Common bile duct: Diameter: 2.4 mm Liver: No focal lesion  identified. Increased parenchymal echogenicity. Portal vein is patent on color Doppler imaging with normal direction of blood flow towards the liver. Other: None. IMPRESSION: Hepatic steatosis.  No focal hepatic lesions. Sequela of cholecystectomy. Electronically Signed   By: Stana Bunting M.D.   On: 01/30/2020 16:50    Cardiac Studies   Echocardiogram 01/31/2020: Impressions: 1. Technically difficult study, even with Definity contrast. There is no  left ventricular thrombus. There is relatively preserved systolic function  in the basal segments. Findings suggest stress cardiomyopathy (takotsubo  sd.), but could well represent  multivessel CAD. Left ventricular ejection fraction, by estimation, is 30  to 35%. The left ventricle has moderate to severely decreased function.  The left ventricle demonstrates global hypokinesis. The left ventricular  internal cavity size was mildly  dilated. Left ventricular diastolic parameters are consistent with Grade  II diastolic dysfunction (pseudonormalization). Elevated left atrial  pressure.  2. Right ventricular systolic function was not well visualized. The right  ventricular size is normal. Tricuspid regurgitation signal is inadequate  for assessing PA pressure.  3. The mitral valve is grossly normal. No evidence of mitral valve  regurgitation. No evidence of mitral stenosis.  4. The aortic valve is grossly normal. Aortic valve regurgitation is not  visualized. No aortic stenosis is present.   Comparison(s): Prior images reviewed side by side. The left ventricular  function is significantly worse. The left ventricular wall motion  abnormalities are new. Consider serial studies, with Definity.   Patient Profile     59 y.o. female with a history of COPD with chronic respiratory failure on 2L of O2 at home, ongoing tobacco use, obstructive sleep apnea on CPAP, hypertension, diabetes mellitus, chronic pain, bipolar disorder, and morbid  obesity but no prior cardiac history who is being seen for evaluation of elevated troponin after being admitted for COPD exacerbation at the request of Dr. Chipper Herb.  Assessment & Plan    NSTEMI vs Demand Ischemia in Setting of Acute Hypercapnic Respiratory Failure Likely Stress Induced Cardiomyopathy - High-sensitivity troponin elevated at 336 >> 1,179 >> 1,655 >> 2,853 >> 3,204.  - EKG shows old anterior infarct but no acute ischemic changes. - Echo showed LVEF of 30-35% with relatively preserved systolic function in the basal segment suggestive of stress cardiomyopathy. - Patient denise any chest pain. - Patient received dose of IV Lasix last night during episode of worsening respiratory distress. 900 cc of documented urinary output since then. She does not appear significantly volume overloaded so will hold on additional Lasix. - Will continue Heparin for 48 hours which would be until this evening around 6pm. - Continue Aspirin. Statin on hold due to acute transaminitis. Will add Toprol-XL 12.5mg  daily.  - Will likely plan to treat medically for now with repeat Echo in about 3 months. If EF still low at that time, will need ischemic evaluation.  Acute Hypercapnic and Hypoxic Respiratory Failure COPD Exacerbation - Patient had worsening respiratory distress last  night requiring BiPAP. Patient noted to be extremely anxious at the time.  - Chest CT yesterday showed mixed ground-glass, consolidation, and tree-in-bud nodularity predominantly in the right lower lobe and to a lesser extent right upper lobe. This could reflect acute multifocal infection or inflammatory process or sequela of aspiration. Also had superimposed features of septal thickening and vascular redistribution/congestion with trace effusions which may suggest some superimposed edematous changes.  - Received one dose of IV Lasix 40mg  last night. - Respiratory panel pending - On Azithromycin, Prednisone, and breathing treatments. -  Pulmonology now following. Management per primary team and Pulmonology.  SVT - During episodes of worsening respiratory, looks like she went into SVT vs sinus tachycardia with rate in the 150's. Rates improved on their own.  - Will start Toprol-XL 12.5mg  daily. - Will continue to monitor on telemetry.  Hypertension - BP soft but stable. Systolic BP in the 80's last night but currently in the 90's. - Home Spironolactone and Lasix on hold due to soft BP and AKI.  AKI - Creatinine 1.66 on admission and slowly coming down. 1.49 today. Baseline around 1.1.  - Continue to avoid nephrotoxic agents.  - Continue to monitor closely.   Acute Transaminitis  - AST and ALT peaked at 1,344 and 1,577 respectively and now slowly coming down. - RUQ ultrasound showed hepatic steatosis but no focal hepatic lesions. - Hepatitis panel negative. - CMV and EBV pending. - Management per primary team.   Diabetes Mellitus  - Hemoglobin A1c 6.2 this admission. - Management per primary team.  Dysphagia - Management per primary team.  Tobacco Abuse - Will need to continue to emphasize importance of complete cessation.  For questions or updates, please contact [facility] Please consult [email or web address] for contact info under        Signed, , PA-C  02/01/2020, 6:46 AM

## 2020-02-01 NOTE — Progress Notes (Signed)
NAME:  Brenda [name], MRN:  [id number], DOB:  March 28, 1961, LOS: 2 ADMISSION DATE:  01/30/2020, CONSULTATION DATE: 01/31/20 REFERRING MD:  Dr. Lonny Prude, CHIEF COMPLAINT:  SOB   Brief History   59 y/o F admitted with increased SOB, cough with yellow sputum production. Found to have acute hypoxic and hypercapnic respiratory failure.    History of present illness   59 y/o F who presented to [name] on 8/31 with reports of 2-3 days of worsening shortness of breath and cough with yellow sputum production.    The patient attempted home nebulization for symptoms but it did not relieve dyspnea.  EMS evaluation found her to be hypoxic with saturations in the 80's while on BiPAP. She was treated with duoneb, magnesium and IV solumedrol in route to the ER. VBG on arrival to ER showed pH 7.14 and CO2 70.  She is not vaccinated for COVID.  COVID testing in ER negative. She was lethargic on arrival and placed on BiPAP.  After 2 hours of therapy, VBG improved to 7.35, PCO2 39.  WBC 15.2.  EKG showed sinus tachycardia. Initial CXR showed bibasilar interstitial prominence, borderline cardiomegaly.  The patient was admitted per [facility] for evaluation of acute respiratory failure.  She was noted to have elevated troponin (1,179 > 1,655 > 2,853).  Additionally, she had elevated LFT's.  Statin was stopped.  The patient was evaluated by Cardiology.    PCCM consulted for pulmonary evaluation.  Past Medical History  OSA - AHI 58 2016, desaturation to 64%, on CPAP  Severe Obstructive Lung Disease - PFT 09/2017 with FVC 1.64 (55%), FEV1 1.02 (44%), ratio 58.  Negative alpha-1 Chronic Hypoxic Respiratory Failure - 2L O2 dependent  Tobacco Abuse  Pancreatitis  Bipolar Disorder  HTN CKD  Cor Pulmonale  Diastolic Dysfunction  Hypertriglyceridemia  IDDM  Diabetic Neuropathy  Chronic Pain  Depression / Anxiety  Lumbar Disc Disease with Radiculopathy  BMI - 40.90  Significant Hospital Events   8/31 Admit  Consults:     Procedures:    Significant Diagnostic Tests:  ECHO 9/1 >> Takotsubo appearance on ECHO with estimated EF of 30-35%, global hypokinesis of left ventricle   Chest CT >> Mixed ground glass consolidation and tree-in bud appearance possibly reflecting acute multifocal infection vs aspiration   Micro Data:  COVID 8/31 >> negative  Sputum 8/31 >>   Antimicrobials:  Ceftriaxone 8/31 >>  Azithromycin 8/31 >>    Interim history/subjective:  Lying in bed on BIPAP in no acute distress  States she feels better Endorsed some concerns for aspiration on assessment of history   Objective   Blood pressure 111/82, pulse 96, temperature 97.7 F (36.5 C), temperature source Axillary, resp. rate (!) 21, height 5' 1.5" (1.562 m), weight 100.9 kg, last menstrual period 03/08/2013, SpO2 100 %.    FiO2 (%):  [40 %-50 %] 40 %   Intake/Output Summary (Last 24 hours) at 02/01/2020 1049 Last data filed at 02/01/2020 0600 Gross per 24 hour  Intake 456.81 ml  Output 900 ml  Net -443.19 ml   Filed Weights   01/31/20 1100 02/01/20 0500  Weight: 99.8 kg 100.9 kg    Examination: General: Adult female lying in bed on BIPAP in no acute distress  HEENT: BIPAP, MM pink/moist, PERRL,  Neuro: Alert and oriented x3 CV: s1s2 regular rate and rhythm, no murmur, rubs, or gallops,  PULM:  Diminished breaht sounds, no increased work of breathgin, tolerating BIPAP well GI: soft, bowel sounds active in  all 4 quadrants, non-tender, non-distended Extremities: warm/dry, no edema  Skin: no rashes or lesions  Resolved Hospital Problem list      Assessment & Plan:   Acute on Chronic Hypoxic / Hypercapnic Respiratory Failure  -CXR review shows cephalization on portable film, ? Edema P: Continue empiric Rocephin and Azithromycin  Break from BIPAP this am Encourage use of BIPAP at rest and HS  RVP pending  ESR 39  Severe Obstructive Lung Disease with suspected Acute Exacerbation  OSA P: Continue prednisone  93m x5 days Continue brovana, pulmicort, and yupetri PRN Duonebs  BIPAP as above   Tobacco Abuse  -smoking cessation counseling provided 9/1  NSTEMI  P: Heparin per phramacy  Follow troponin Cardiology following  Ischemic workup once moe stable from respiratory standpoint  Elevated LFT's  -Negative hepatitis panel.  [place] steatosis.  P: Trend LFT's   Best practice:  Diet: heart healthy   Pain/Anxiety/Delirium protocol (if indicated): n/a  VAP protocol (if indicated): n/a  DVT prophylaxis: heparin gtt GI prophylaxis: PPI  Glucose control: SSI, per primary  Mobility: as tolerated  Code Status: Full Code  Family Communication: Patient and daughter updated 9/1 on plan of care  Disposition: Per [facility]   Labs   CBC: Recent Labs  Lab 01/30/20 0955 01/30/20 1008 01/30/20 1207 01/31/20 0639 02/01/20 0352  WBC 15.2*  --   --  17.4* 19.7*  NEUTROABS 6.9  --   --   --  16.0*  HGB 14.3 15.6* 16.0* 14.4 13.5  HCT 47.6* 46.0 47.0* 44.4 41.2  MCV 98.6  --   --  92.9 93.2  PLT 299  --   --  159 1262   Basic Metabolic Panel: Recent Labs  Lab 01/30/20 0955 01/30/20 1008 01/30/20 1207 01/31/20 0639 02/01/20 0352  NA 136 137 137 136 138  K 4.9 4.7 4.7 4.0 4.2  CL 98  --   --  101 102  CO2 24  --   --  22 25  GLUCOSE 455*  --   --  133* 118*  BUN 17  --   --  29* 35*  CREATININE 1.66*  --   --  1.57* 1.49*  CALCIUM 9.1  --   --  9.3 9.1   GFR: Estimated Creatinine Clearance: 43.7 mL/min (A) (by C-G formula based on SCr of 1.49 mg/dL (H)). Recent Labs  Lab 01/30/20 0955 01/31/20 0639 02/01/20 0352  WBC 15.2* 17.4* 19.7*    Liver Function Tests: Recent Labs  Lab 01/30/20 0955 01/31/20 0639 02/01/20 0352  AST 193* 1,344* 718*  ALT 208* 1,577* 1,471*  ALKPHOS 126 124 100  BILITOT 0.4 0.4 0.7  PROT 7.3 7.3 6.9  ALBUMIN 3.4* 3.6 3.5   No results for input(s): LIPASE, AMYLASE in the last 168 hours. No results for input(s): AMMONIA in the last 168  hours.  ABG    Component Value Date/Time   HCO3 22.3 01/30/2020 1207   TCO2 24 01/30/2020 1207   ACIDBASEDEF 3.0 (H) 01/30/2020 1207   O2SAT 63.0 01/30/2020 1207     Coagulation Profile: No results for input(s): INR, PROTIME in the last 168 hours.  Cardiac Enzymes: Recent Labs  Lab 01/30/20 1510  CKTOTAL 199    HbA1C: Hgb A1c MFr Bld  Date/Time Value Ref Range Status  01/30/2020 03:10 PM 6.2 (H) 4.8 - 5.6 % Final    Comment:    (NOTE) Pre diabetes:          5.7%-6.4%  Diabetes:              >6.4%  Glycemic control for   <7.0% adults with diabetes   07/31/2019 08:08 AM 6.4 4.6 - 6.5 % Final    Comment:    Glycemic Control Guidelines for People with Diabetes:Non Diabetic:  <6%Goal of Therapy: <7%Additional Action Suggested:  >8%     CBG: Recent Labs  Lab 01/31/20 0618 01/31/20 1113 01/31/20 1612 01/31/20 2114 02/01/20 0834  GLUCAP 152* 110* 146* 111* 121*    Critical care time: N/A  Johnsie Cancel, NP-C [facility] Contact / Pager information can be found on Amion  02/01/2020, 11:15 AM

## 2020-02-01 NOTE — Progress Notes (Signed)
Rn called to let us know that the pt has some nose bleeding after blowing her nose. She is on heparin but it was scheduled to be stopped tonight at 1800. It's now 17:30 so asked rn to stop it at this point. FYI secure chat Dr. Caleb Popp.  Ulyses Southward, PharmD, BCIDP, AAHIVP, CPP Infectious Disease Pharmacist 02/01/2020 5:35 PM

## 2020-02-01 NOTE — Progress Notes (Signed)
PROGRESS NOTE    [name]  OEV:[id number] DOB: 1960-12-18 DOA: 01/30/2020 PCP: [name], MD   Brief Narrative: Brenda [name] is a 59 y.o. female with medical history significant of chronic hypoxic respiratory failure on 2 to 3 L baseline O2 plus CPAP at bedtime, COPD Gold stage III (FEV1 44%, improved with bronchodilator to 50%, 2019), chronic cigarette smoker 5-10 cigarettes a day hypertension, OSA on CPAP at bedtime, IIDM, diabetic neuropathy, chronic pain, depression/anxiety, morbid obesity. Patient presented secondary severe dyspnea with concern for COPD exacerbation. Also found to have evidence of possible NSTEMI in addition to labs concerning for acute hepatitis.   Assessment & Plan:   Active Problems:   COPD exacerbation (HCC)   Acute respiratory failure with hypoxia (HCC)   COPD (chronic obstructive pulmonary disease) (HCC)   COPD exacerbation Chest x-ray suggests pneumonitis. Patient started empirically on Prednisone, Ceftriaxone and Azithromycin. Patient given BiPAP on admission and hs improved this morning but is far from baseline. Triggers include allergens but patient reports no other history of viral-like illness. CT chest significant for ground-glass opacities bilaterally. Afebrile. -Continue Duonebs, Prednisone -Continue Flonase -Continue Ceftriaxone/Azithromcin -Pulmonology recommendations: RVP, sputum culture pending. Recommendations pending today  Acute on chronic respiratory failure with hypoxia Patient is on 2 L via [place] chronically at home. In setting of above. -Wean to baseline as able. Goals SpO2 >88% -BiPAP as needed to respiratory distress  Elevated troponin Possible stressed induced cardiomyopathy per cardiology. Last troponin of 3204. -Cardiology recommendations: likely medical treatment with Aspirin, statin (once LFTs improved)  Leukocytosis Likely secondary to infection. Differential on admission significant for lymphocytosis and  monocytosis initially which have now resolved. Patient now has developed neutrophilia which may be related to infection vs steroids. -CMV/EBV -Daily CBC w/ Diff  AKI Baseline creatinine of 1.1. Creatinine of 1.66 on admission and is improving slowly. -BMP in AM  Elevated AST/ALT Ultrasound significant for hepatic steatosis. No associated hyperbilirubinemia. Trending upwards. Hepatitis panel obtained and is negative. Patient is on Lipitor as an outpatient which could also be the etiology behind rise; no associated myalgias. No hypotension noted. -CMV/EBV  OSA -Continue CPAP qhs  Anxiety -continue Ativan prn  Diabetes mellitus, type 2 Patient is on metformin as an outpatient. Last hemoglobin A1C of 6.2% -Continue SSI  Diabetic neuropathy -Continue gabapentin; dose for renal function   DVT prophylaxis: Heparin drip Code Status:   Code Status: Full Code Family Communication: None at bedside Disposition Plan: Discharge likely home in several days pending continued workup/management of respiratory/heart and liver disease. PT with recommendations for not PT follow-up and intermittent supervision.   Consultants:   Cardiology  Procedures:   TRANSTHORACIC ECHOCARDIOGRAM (01/31/2020) IMPRESSIONS    1. Technically difficult study, even with Definity contrast. There is no  left ventricular thrombus. There is relatively preserved systolic function  in the basal segments. Findings suggest stress cardiomyopathy (takotsubo  sd.), but could well represent  multivessel CAD. Left ventricular ejection fraction, by estimation, is 30  to 35%. The left ventricle has moderate to severely decreased function.  The left ventricle demonstrates global hypokinesis. The left ventricular  internal cavity size was mildly  dilated. Left ventricular diastolic parameters are consistent with Grade  II diastolic dysfunction (pseudonormalization). Elevated left atrial  pressure.  2. Right ventricular  systolic function was not well visualized. The right  ventricular size is normal. Tricuspid regurgitation signal is inadequate  for assessing PA pressure.  3. The mitral valve is grossly normal. No evidence of mitral  valve  regurgitation. No evidence of mitral stenosis.  4. The aortic valve is grossly normal. Aortic valve regurgitation is not  visualized. No aortic stenosis is present.   Antimicrobials:  Azithromycin  Ceftriaxone    Subjective: Some dry mouth. No other issues overnight. Breathing is improved from yesterday. Not at baseline.  Objective: Vitals:   02/01/20 0000 02/01/20 0420 02/01/20 0459 02/01/20 0500  BP: 97/71 103/75    Pulse: 88 84    Resp: 19 20    Temp: 97.6 F (36.4 C) 97.8 F (36.6 C)    TempSrc: Axillary Axillary    SpO2: 100% 100% 100%   Weight:    100.9 kg  Height:        Intake/Output Summary (Last 24 hours) at 02/01/2020 0732 Last data filed at 02/01/2020 0600 Gross per 24 hour  Intake 456.81 ml  Output 900 ml  Net -443.19 ml   Filed Weights   01/31/20 1100 02/01/20 0500  Weight: 99.8 kg 100.9 kg    Examination:  General exam: Appears calm and comfortable Respiratory system: Increased wheezing with improved air movement. Respiratory effort normal. Cardiovascular system: S1 & S2 heard, RRR. No murmurs, rubs, gallops or clicks. Gastrointestinal system: Abdomen is nondistended, soft and nontender. No organomegaly or masses felt. Normal bowel sounds heard. Central nervous system: Alert and oriented. No focal neurological deficits. Musculoskeletal: No edema. No calf tenderness Skin: No cyanosis. No rashes Psychiatry: Judgement and insight appear normal. Mood & affect appropriate.    Data Reviewed: I have personally reviewed following labs and imaging studies  CBC Lab Results  Component Value Date   WBC 19.7 (H) 02/01/2020   RBC 4.42 02/01/2020   HGB 13.5 02/01/2020   HCT 41.2 02/01/2020   MCV 93.2 02/01/2020   MCH 30.5  02/01/2020   PLT 151 02/01/2020   MCHC 32.8 02/01/2020   RDW 13.6 02/01/2020   LYMPHSABS 2.4 02/01/2020   MONOABS 1.1 (H) 02/01/2020   EOSABS 0.0 02/01/2020   BASOSABS 0.0 02/01/2020     Last metabolic panel Lab Results  Component Value Date   NA 138 02/01/2020   K 4.2 02/01/2020   CL 102 02/01/2020   CO2 25 02/01/2020   BUN 35 (H) 02/01/2020   CREATININE 1.49 (H) 02/01/2020   GLUCOSE 118 (H) 02/01/2020   GFRNONAA 38 (L) 02/01/2020   GFRAA 43 (L) 02/01/2020   CALCIUM 9.1 02/01/2020   PHOS 3.4 07/26/2015   PROT 6.9 02/01/2020   ALBUMIN 3.5 02/01/2020   BILITOT 0.7 02/01/2020   ALKPHOS 100 02/01/2020   AST 718 (H) 02/01/2020   ALT 1,471 (H) 02/01/2020   ANIONGAP 11 02/01/2020    CBG (last 3)  Recent Labs    01/31/20 1113 01/31/20 1612 01/31/20 2114  GLUCAP 110* 146* 111*     GFR: Estimated Creatinine Clearance: 43.7 mL/min (A) (by C-G formula based on SCr of 1.49 mg/dL (H)).  Coagulation Profile: No results for input(s): INR, PROTIME in the last 168 hours.  Recent Results (from the past 240 hour(s))  SARS Coronavirus 2 by RT PCR (hospital order[facility]) Nasopharyngeal Nasopharyngeal Swab     Status: None   Collection Time: 01/30/20  9:52 AM   Specimen: Nasopharyngeal Swab  Result Value Ref Range Status   SARS Coronavirus 2 NEGATIVE NEGATIVE Final    Comment: (NOTE) SARS-CoV-2 target nucleic acids are NOT DETECTED.  The SARS-CoV-2 RNA is generally detectable in upper and lower respiratory specimens during the acute phase  of infection. The lowest concentration of SARS-CoV-2 viral copies this assay can detect is 250 copies / mL. A negative result does not preclude SARS-CoV-2 infection and should not be used as the sole basis for treatment or other patient management decisions.  A negative result may occur with improper specimen collection / handling, submission of specimen other than nasopharyngeal swab, presence of viral  mutation(s) within the areas targeted by this assay, and inadequate number of viral copies (<250 copies / mL). A negative result must be combined with clinical observations, patient history, and epidemiological information.  Fact Sheet for Patients:   BoilerBrush.com.cy  Fact Sheet for Healthcare Providers: https://pope.com/  This test is not yet approved or  cleared by the [place] FDA and has been authorized for detection and/or diagnosis of SARS-CoV-2 by FDA under an Emergency Use Authorization (EUA).  This EUA will remain in effect (meaning this test can be used) for the duration of the COVID-19 declaration under Section 564(b)(1) of the Act, 21 U.S.C. section 360bbb-3(b)(1), unless the authorization is terminated or revoked sooner.  Performed at [facility], [address] [place], [place] [address]   Expectorated sputum assessment w rflx to resp cult     Status: None   Collection Time: 01/31/20  4:48 AM   Specimen: Expectorated Sputum  Result Value Ref Range Status   Specimen Description EXPECTORATED SPUTUM  Final   Special Requests NONE  Final   Sputum evaluation   Final    Sputum specimen not acceptable for testing.  Please recollect.   RESULT CALLED TO, READ BACK BY AND VERIFIED WITH: C GOAD 01/31/20 0557 JDW Performed at [facility], [address], [place], [place] [address]    Report Status 01/31/2020 FINAL  Final  MRSA PCR Screening     Status: None   Collection Time: 01/31/20  8:46 PM   Specimen: Nasal Mucosa; Nasopharyngeal  Result Value Ref Range Status   MRSA by PCR NEGATIVE NEGATIVE Final    Comment:        The GeneXpert MRSA Assay (FDA approved for NASAL specimens only), is one component of a comprehensive MRSA colonization surveillance program. It is not intended to diagnose MRSA infection nor to guide or monitor treatment for MRSA infections. Performed at [facility],  [address], [place], [place] [address]         Radiology Studies: CT CHEST WO CONTRAST  Result Date: 01/31/2020 CLINICAL DATA:  Respiratory distress, COPD exacerbation EXAM: CT CHEST WITHOUT CONTRAST TECHNIQUE: Multidetector CT imaging of the chest was performed following the standard protocol without IV contrast. COMPARISON:  Radiograph 01/30/2020, CT 09/07/2017 FINDINGS: Cardiovascular: Normal cardiac size. No pericardial effusion. Few scattered coronary artery calcifications are present. Atherosclerotic calcifications present in the normal caliber thoracic aorta and proximal great vessels which branch normally from the aortic arch. No periaortic stranding or hemorrhage. Central pulmonary arteries are normal caliber. Luminal evaluation precluded in the absence of contrast media. No major venous abnormalities are seen. Mediastinum/Nodes: No mediastinal fluid or gas. Normal thyroid gland and thoracic inlet. No acute abnormality of the trachea or esophagus. No worrisome mediastinal or axillary adenopathy.Few prominent paratracheal nodes are present but not significantly changed from prior including a 9 mm lymph node with preserved fatty hilum (3/20). Hilar nodal evaluation is limited in the absence of intravenous contrast media. Lungs/Pleura: There is diffuse mild airways thickening. Mixed ground-glass, consolidation and tree-in-bud nodularity is present predominantly in the right lower lobe and posterior  segment right upper lobe to a lesser extent. Some mild interlobular septal and fissural thickening is noted as well with trace bilateral effusions. Central pulmonary vascular congestion is present with redistributed pulmonary vascularity. Additional bandlike areas of opacity may reflect atelectatic change or scarring. No pneumothorax. Few scattered calcified granulomata. No concerning pulmonary nodules or masses within the limitations of parenchymal disease though follow-up to resolution should be  considered. Upper Abdomen: Patient appears to be post cholecystectomy. Interposition of the hepatic flexure anterior to the liver. Stable lobular thickening of the left adrenal gland. Musculoskeletal: Multilevel degenerative changes are present in the imaged portions of the spine. Some sclerotic likely Modic type endplate changes noted throughout the mid to lower thoracic levels. No acute or concerning osseous lesions. No worrisome chest wall masses or lesions. Mild body wall edema is noted. IMPRESSION: 1. Mixed ground-glass, consolidation and tree-in-bud nodularity predominantly in the right lower lobe and to a lesser extent the posterior segment right upper lobe. Findings could reflect an acute multifocal infection or inflammatory process or sequela of aspiration. 2. Additional superimposed features of septal thickening and vascular redistribution/congestion with trace effusions may suggest some superimposed edematous change as well. 3. Mild body wall edema. 4. Aortic Atherosclerosis (ICD10-I70.0). 5. Coronary artery calcifications are present. Please note that the presence of coronary artery calcium documents the presence of coronary artery disease, the severity of this disease and any potential stenosis cannot be assessed on this non-gated CT examination. Electronically Signed   By: Kreg Shropshire M.D.   On: 01/31/2020 23:56   DG Chest Portable 1 View  Result Date: 01/30/2020 CLINICAL DATA:  Shortness of breath.  Potential COVID. EXAM: PORTABLE CHEST 1 VIEW COMPARISON:  05/30/2019. FINDINGS: Borderline cardiomegaly. No pulmonary venous congestion. Diffuse prominent bilateral interstitial prominence itis. No pleural effusion or pneumothorax. IMPRESSION: 1. Diffuse prominent bilateral interstitial prominence consistent with pneumonitis. 2.  Borderline cardiomegaly.  No pulmonary venous congestion. Electronically Signed   By: Maisie Fus  Register   On: 01/30/2020 11:09   ECHOCARDIOGRAM COMPLETE  Result Date:  01/31/2020    ECHOCARDIOGRAM REPORT   Patient Name:   YUVIA PLANT Date of Exam: 01/31/2020 Medical Rec #:  [id number]     Height:       61.0 in Accession #:    [id number]    Weight:       216.0 lb Date of Birth:  1960/08/04     BSA:          1.952 m Patient Age:    59 years      BP:           102/66 mmHg Patient Gender: F             HR:           71 bpm. Exam Location:  Inpatient Procedure: 2D Echo Indications:    dyspnea  History:        Patient has no prior history of Echocardiogram examinations.                 COPD; Risk Factors:Hypertension and Current Smoker.  Sonographer:    Celene Skeen RDCS (AE) Referring Phys: [id number] [place] [facility]  Sonographer Comments: Image acquisition challenging due to patient body habitus, Image acquisition challenging due to respiratory motion and Image acquisition challenging due to COPD. IMPRESSIONS  1. Technically difficult study, even with Definity contrast. There is no left ventricular thrombus. There is relatively preserved systolic function in the basal segments. Findings suggest stress cardiomyopathy (  takotsubo sd.), but could well represent multivessel CAD. Left ventricular ejection fraction, by estimation, is 30 to 35%. The left ventricle has moderate to severely decreased function. The left ventricle demonstrates global hypokinesis. The left ventricular internal cavity size was mildly dilated. Left ventricular diastolic parameters are consistent with Grade II diastolic dysfunction (pseudonormalization). Elevated left atrial pressure.  2. Right ventricular systolic function was not well visualized. The right ventricular size is normal. Tricuspid regurgitation signal is inadequate for assessing PA pressure.  3. The mitral valve is grossly normal. No evidence of mitral valve regurgitation. No evidence of mitral stenosis.  4. The aortic valve is grossly normal. Aortic valve regurgitation is not visualized. No aortic stenosis is present. Comparison(s): Prior images reviewed  side by side. The left ventricular function is significantly worse. The left ventricular wall motion abnormalities are new. Consider serial studies, with Definity. FINDINGS  Left Ventricle: Technically difficult study, even with Definity contrast. There is no left ventricular thrombus. There is relatively preserved systolic function in the basal segments. Findings suggest stress cardiomyopathy (takotsubo sd.), but could well represent multivessel CAD. Left ventricular ejection fraction, by estimation, is 30 to 35%. The left ventricle has moderate to severely decreased function. The left ventricle demonstrates global hypokinesis. Definity contrast agent was given IV to delineate the left ventricular endocardial borders. The left ventricular internal cavity size was mildly dilated. There is no left ventricular hypertrophy. Left ventricular diastolic parameters are consistent with Grade II diastolic dysfunction (pseudonormalization). Elevated left atrial pressure. Right Ventricle: The right ventricular size is normal. No increase in right ventricular wall thickness. Right ventricular systolic function was not well visualized. Tricuspid regurgitation signal is inadequate for assessing PA pressure. Left Atrium: Left atrial size was normal in size. Right Atrium: Right atrial size was normal in size. Pericardium: There is no evidence of pericardial effusion. Mitral Valve: The mitral valve is grossly normal. No evidence of mitral valve regurgitation. No evidence of mitral valve stenosis. Tricuspid Valve: The tricuspid valve is normal in structure. Tricuspid valve regurgitation is not demonstrated. Aortic Valve: The aortic valve is grossly normal. Aortic valve regurgitation is not visualized. No aortic stenosis is present. Pulmonic Valve: The pulmonic valve was not well visualized. Pulmonic valve regurgitation is not visualized. Aorta: The aortic root is normal in size and structure. IAS/Shunts: No atrial level shunt  detected by color flow Doppler.  LEFT VENTRICLE PLAX 2D LVIDd:         5.50 cm  Diastology LVIDs:         4.20 cm  LV e' lateral:   7.51 cm/s LV PW:         0.90 cm  LV E/e' lateral: 11.5 LV IVS:        1.10 cm LVOT diam:     2.40 cm LV SV:         80 LV SV Index:   41 LVOT Area:     4.52 cm  LEFT ATRIUM           Index LA diam:      3.70 cm 1.90 cm/m LA Vol (A4C): 47.7 ml 24.44 ml/m  AORTIC VALVE LVOT Vmax:   96.60 cm/s LVOT Vmean:  70.600 cm/s LVOT VTI:    0.176 m  AORTA Ao Root diam: 2.70 cm MITRAL VALVE MV Area (PHT): 3.54 cm    SHUNTS MV Decel Time: 214 msec    Systemic VTI:  0.18 m MV E velocity: 86.50 cm/s  Systemic Diam: 2.40 cm MV A velocity:  72.00 cm/s MV E/A ratio:  1.20 Rachelle Hora Croitoru MD Electronically signed by Thurmon Fair MD Signature Date/Time: 01/31/2020/4:22:37 PM    Final    US Abdomen Limited RUQ  Result Date: 01/30/2020 CLINICAL DATA:  Elevated liver enzymes. EXAM: ULTRASOUND ABDOMEN LIMITED RIGHT UPPER QUADRANT COMPARISON:  01/11/2006 abdominal ultrasound. FINDINGS: Gallbladder: Surgically absent. Common bile duct: Diameter: 2.4 mm Liver: No focal lesion identified. Increased parenchymal echogenicity. Portal vein is patent on color Doppler imaging with normal direction of blood flow towards the liver. Other: None. IMPRESSION: Hepatic steatosis.  No focal hepatic lesions. Sequela of cholecystectomy. Electronically Signed   By: Stana Bunting M.D.   On: 01/30/2020 16:50        Scheduled Meds: . arformoterol  15 mcg Nebulization BID  . aspirin EC  81 mg Oral Daily  . azithromycin  500 mg Oral Daily  . budesonide (PULMICORT) nebulizer solution  0.5 mg Nebulization BID  . escitalopram  30 mg Oral Daily  . feeding supplement (GLUCERNA SHAKE)  237 mL Oral BID BM  . fluticasone  1 spray Each Nare BID  . gabapentin  600 mg Oral Daily  . gabapentin  900 mg Oral QHS  . insulin aspart  0-5 Units Subcutaneous QHS  . insulin aspart  0-9 Units Subcutaneous TID WC  . insulin  aspart  5 Units Subcutaneous TID WC  . ipratropium-albuterol  3 mL Nebulization Q6H  . multivitamin with minerals  1 tablet Oral Daily  . nicotine  14 mg Transdermal Daily  . ondansetron (ZOFRAN) IV  4 mg Intravenous Once  . pantoprazole  40 mg Oral QAC breakfast  . predniSONE  40 mg Oral Q breakfast  . revefenacin  175 mcg Nebulization Daily   Continuous Infusions: . cefTRIAXone (ROCEPHIN)  IV 1 g (01/31/20 1405)  . heparin 1,300 Units/hr (02/01/20 0424)     LOS: 2 days     Jacquelin Hawking, MD [facility] 02/01/2020, 7:32 AM  If 7PM-7AM, please contact night-coverage [email or web address]

## 2020-02-02 ENCOUNTER — Inpatient Hospital Stay (HOSPITAL_COMMUNITY): Payer: Medicare Other

## 2020-02-02 DIAGNOSIS — J690 Pneumonitis due to inhalation of food and vomit: Secondary | ICD-10-CM

## 2020-02-02 LAB — COMPREHENSIVE METABOLIC PANEL
ALT: 924 U/L — ABNORMAL HIGH (ref 0–44)
AST: 166 U/L — ABNORMAL HIGH (ref 15–41)
Albumin: 3.3 g/dL — ABNORMAL LOW (ref 3.5–5.0)
Alkaline Phosphatase: 86 U/L (ref 38–126)
Anion gap: 13 (ref 5–15)
BUN: 25 mg/dL — ABNORMAL HIGH (ref 8–23)
CO2: 25 mmol/L (ref 22–32)
Calcium: 9.2 mg/dL (ref 8.9–10.3)
Chloride: 101 mmol/L (ref 98–111)
Creatinine, Ser: 1.16 mg/dL — ABNORMAL HIGH (ref 0.44–1.00)
GFR calc Af Amer: 59 mL/min — ABNORMAL LOW (ref >60)
GFR calc non Af Amer: 51 mL/min — ABNORMAL LOW (ref >60)
Glucose, Bld: 151 mg/dL — ABNORMAL HIGH (ref 70–99)
Potassium: 5.2 mmol/L — ABNORMAL HIGH (ref 3.5–5.1)
Sodium: 139 mmol/L (ref 135–145)
Total Bilirubin: 0.9 mg/dL (ref 0.3–1.2)
Total Protein: 6.6 g/dL (ref 6.5–8.1)

## 2020-02-02 LAB — CBC WITH DIFFERENTIAL/PLATELET
Abs Immature Granulocytes: 0.05 10*3/uL (ref 0.00–0.07)
Basophils Absolute: 0 10*3/uL (ref 0.0–0.1)
Basophils Relative: 0 %
Eosinophils Absolute: 0 10*3/uL (ref 0.0–0.5)
Eosinophils Relative: 0 %
HCT: 40.2 % (ref 36.0–46.0)
Hemoglobin: 12.7 g/dL (ref 12.0–15.0)
Immature Granulocytes: 0 %
Lymphocytes Relative: 13 %
Lymphs Abs: 1.6 10*3/uL (ref 0.7–4.0)
MCH: 29.6 pg (ref 26.0–34.0)
MCHC: 31.6 g/dL (ref 30.0–36.0)
MCV: 93.7 fL (ref 80.0–100.0)
Monocytes Absolute: 0.4 10*3/uL (ref 0.1–1.0)
Monocytes Relative: 3 %
Neutro Abs: 10 10*3/uL — ABNORMAL HIGH (ref 1.7–7.7)
Neutrophils Relative %: 84 %
Platelets: 140 10*3/uL — ABNORMAL LOW (ref 150–400)
RBC: 4.29 MIL/uL (ref 3.87–5.11)
RDW: 13.5 % (ref 11.5–15.5)
WBC: 12 10*3/uL — ABNORMAL HIGH (ref 4.0–10.5)
nRBC: 0 % (ref 0.0–0.2)

## 2020-02-02 LAB — GLUCOSE, CAPILLARY
Glucose-Capillary: 108 mg/dL — ABNORMAL HIGH (ref 70–99)
Glucose-Capillary: 134 mg/dL — ABNORMAL HIGH (ref 70–99)
Glucose-Capillary: 172 mg/dL — ABNORMAL HIGH (ref 70–99)
Glucose-Capillary: 133 mg/dL — ABNORMAL HIGH (ref 70–99)

## 2020-02-02 LAB — RSV(RESPIRATORY SYNCYTIAL VIRUS) AB, BLOOD: RSV Ab: 1:8 {titer} — ABNORMAL HIGH

## 2020-02-02 MED ORDER — OSELTAMIVIR PHOSPHATE 30 MG PO CAPS
30.0000 mg | ORAL_CAPSULE | Freq: Two times a day (BID) | ORAL | Status: DC
  Administered 2020-02-02 (×2): 30 mg via ORAL
  Filled 2020-02-02 (×3): qty 1

## 2020-02-02 NOTE — Progress Notes (Signed)
Progress Note  Patient Name: Brenda [name] Date of Encounter: 02/02/2020  [facility] Cardiologist: Reatha Harps, MD   Subjective   No acute overnight events. Breathing much better than before but still not at baseline. Currently off BiPAP during the day (still using at night). On 5 L via nasal cannula (on 2-3L at home). No chest pain. No palpitations, lightheadedness, dizziness.   Inpatient Medications    Scheduled Meds: . arformoterol  15 mcg Nebulization BID  . aspirin EC  81 mg Oral Daily  . azithromycin  500 mg Oral Daily  . budesonide (PULMICORT) nebulizer solution  0.5 mg Nebulization BID  . escitalopram  30 mg Oral Daily  . feeding supplement (GLUCERNA SHAKE)  237 mL Oral BID BM  . fluticasone  1 spray Each Nare BID  . gabapentin  600 mg Oral Daily  . gabapentin  900 mg Oral QHS  . insulin aspart  0-5 Units Subcutaneous QHS  . insulin aspart  0-9 Units Subcutaneous TID WC  . insulin aspart  5 Units Subcutaneous TID WC  . ipratropium-albuterol  3 mL Nebulization Q6H  . metoprolol succinate  12.5 mg Oral Daily  . multivitamin with minerals  1 tablet Oral Daily  . nicotine  14 mg Transdermal Daily  . ondansetron (ZOFRAN) IV  4 mg Intravenous Once  . pantoprazole  40 mg Oral QAC breakfast  . predniSONE  40 mg Oral Q breakfast  . revefenacin  175 mcg Nebulization Daily   Continuous Infusions: . cefTRIAXone (ROCEPHIN)  IV Stopped (02/01/20 1801)   PRN Meds: acetaminophen, albuterol, benzonatate, dextromethorphan-guaiFENesin, LORazepam, metoprolol tartrate   Vital Signs    Vitals:   02/02/20 0332 02/02/20 0629 02/02/20 0733 02/02/20 0756  BP:  106/74  96/69  Pulse: 73 66  72  Resp: 15 18  20   Temp:  98.3 F (36.8 C)  (!) 97.5 F (36.4 C)  TempSrc:  Tympanic  Oral  SpO2:   97% 100%  Weight:  98.7 kg    Height:        Intake/Output Summary (Last 24 hours) at 02/02/2020 0800 Last data filed at 02/01/2020 2251 Gross per 24 hour  Intake 1305.63 ml  Output  1659 ml  Net -353.37 ml   Last 3 Weights 02/02/2020 02/01/2020 01/31/2020  Weight (lbs) 217 lb 9.5 oz 222 lb 7.1 oz 220 lb  Weight (kg) 98.7 kg 100.9 kg 99.791 kg      Telemetry    Normal sinus rhythm with rates in the 60's to 80's. - Personally Reviewed  ECG    No new ECG tracing today. - Personally Reviewed  Physical Exam   GEN: Morbidly obese Caucasian female in no acute distress.   Neck: Supple. JVD difficult to assess due to body habitus. Cardiac: RRR. No murmurs, rubs, or gallops.  Respiratory: No increased work of breathing. Diminished breath sounds. Diffuse expiratory rhonchi and some faint wheezing noted. No crackles. GI: Soft, non-tender, non-distended  MS: No lower extremity edema. No deformity. Neuro:  No focal deficits. Psych: Normal affect. Responds appropriately.  Labs    High Sensitivity Troponin:   Recent Labs  Lab 01/30/20 1206 01/30/20 1510 01/31/20 0639 01/31/20 1833 02/01/20 0906  TROPONINIHS 1,179* 1,655* 2,853* 3,204* 2,570*      Chemistry Recent Labs  Lab 01/30/20 0955 01/30/20 1008 01/30/20 1207 01/31/20 0639 02/01/20 0352  NA 136   < > 137 136 138  K 4.9   < > 4.7 4.0 4.2  CL  98  --   --  101 102  CO2 24  --   --  22 25  GLUCOSE 455*  --   --  133* 118*  BUN 17  --   --  29* 35*  CREATININE 1.66*  --   --  1.57* 1.49*  CALCIUM 9.1  --   --  9.3 9.1  PROT 7.3  --   --  7.3 6.9  ALBUMIN 3.4*  --   --  3.6 3.5  AST 193*  --   --  1,344* 718*  ALT 208*  --   --  1,577* 1,471*  ALKPHOS 126  --   --  124 100  BILITOT 0.4  --   --  0.4 0.7  GFRNONAA 33*  --   --  35* 38*  GFRAA 38*  --   --  41* 43*  ANIONGAP 14  --   --  13 11   < > = values in this interval not displayed.     Hematology Recent Labs  Lab 01/30/20 0955 01/30/20 1008 01/30/20 1207 01/31/20 0639 02/01/20 0352  WBC 15.2*  --   --  17.4* 19.7*  RBC 4.83  --   --  4.78 4.42  HGB 14.3   < > 16.0* 14.4 13.5  HCT 47.6*   < > 47.0* 44.4 41.2  MCV 98.6  --   --  92.9  93.2  MCH 29.6  --   --  30.1 30.5  MCHC 30.0  --   --  32.4 32.8  RDW 14.0  --   --  13.7 13.6  PLT 299  --   --  159 151   < > = values in this interval not displayed.    BNP Recent Labs  Lab 01/30/20 0955  BNP 47.7     DDimer No results for input(s): DDIMER in the last 168 hours.   Radiology    CT CHEST WO CONTRAST  Result Date: 01/31/2020 CLINICAL DATA:  Respiratory distress, COPD exacerbation EXAM: CT CHEST WITHOUT CONTRAST TECHNIQUE: Multidetector CT imaging of the chest was performed following the standard protocol without IV contrast. COMPARISON:  Radiograph 01/30/2020, CT 09/07/2017 FINDINGS: Cardiovascular: Normal cardiac size. No pericardial effusion. Few scattered coronary artery calcifications are present. Atherosclerotic calcifications present in the normal caliber thoracic aorta and proximal great vessels which branch normally from the aortic arch. No periaortic stranding or hemorrhage. Central pulmonary arteries are normal caliber. Luminal evaluation precluded in the absence of contrast media. No major venous abnormalities are seen. Mediastinum/Nodes: No mediastinal fluid or gas. Normal thyroid gland and thoracic inlet. No acute abnormality of the trachea or esophagus. No worrisome mediastinal or axillary adenopathy.Few prominent paratracheal nodes are present but not significantly changed from prior including a 9 mm lymph node with preserved fatty hilum (3/20). Hilar nodal evaluation is limited in the absence of intravenous contrast media. Lungs/Pleura: There is diffuse mild airways thickening. Mixed ground-glass, consolidation and tree-in-bud nodularity is present predominantly in the right lower lobe and posterior segment right upper lobe to a lesser extent. Some mild interlobular septal and fissural thickening is noted as well with trace bilateral effusions. Central pulmonary vascular congestion is present with redistributed pulmonary vascularity. Additional bandlike areas of  opacity may reflect atelectatic change or scarring. No pneumothorax. Few scattered calcified granulomata. No concerning pulmonary nodules or masses within the limitations of parenchymal disease though follow-up to resolution should be considered. Upper Abdomen: Patient appears to be post cholecystectomy.  Interposition of the hepatic flexure anterior to the liver. Stable lobular thickening of the left adrenal gland. Musculoskeletal: Multilevel degenerative changes are present in the imaged portions of the spine. Some sclerotic likely Modic type endplate changes noted throughout the mid to lower thoracic levels. No acute or concerning osseous lesions. No worrisome chest wall masses or lesions. Mild body wall edema is noted. IMPRESSION: 1. Mixed ground-glass, consolidation and tree-in-bud nodularity predominantly in the right lower lobe and to a lesser extent the posterior segment right upper lobe. Findings could reflect an acute multifocal infection or inflammatory process or sequela of aspiration. 2. Additional superimposed features of septal thickening and vascular redistribution/congestion with trace effusions may suggest some superimposed edematous change as well. 3. Mild body wall edema. 4. Aortic Atherosclerosis (ICD10-I70.0). 5. Coronary artery calcifications are present. Please note that the presence of coronary artery calcium documents the presence of coronary artery disease, the severity of this disease and any potential stenosis cannot be assessed on this non-gated CT examination. Electronically Signed   By: Kreg Shropshire M.D.   On: 01/31/2020 23:56   ECHOCARDIOGRAM COMPLETE  Result Date: 01/31/2020    ECHOCARDIOGRAM REPORT   Patient Name:   Brenda [name] Date of Exam: 01/31/2020 Medical Rec #:  [id number]     Height:       61.0 in Accession #:    [id number]    Weight:       216.0 lb Date of Birth:  09/29/1960     BSA:          1.952 m Patient Age:    59 years      BP:           102/66 mmHg Patient Gender: F              HR:           71 bpm. Exam Location:  Inpatient Procedure: 2D Echo Indications:    dyspnea  History:        Patient has no prior history of Echocardiogram examinations.                 COPD; Risk Factors:Hypertension and Current Smoker.  Sonographer:    Celene Skeen RDCS (AE) Referring Phys: [id number] Emeline General  Sonographer Comments: Image acquisition challenging due to patient body habitus, Image acquisition challenging due to respiratory motion and Image acquisition challenging due to COPD. IMPRESSIONS  1. Technically difficult study, even with Definity contrast. There is no left ventricular thrombus. There is relatively preserved systolic function in the basal segments. Findings suggest stress cardiomyopathy (takotsubo sd.), but could well represent multivessel CAD. Left ventricular ejection fraction, by estimation, is 30 to 35%. The left ventricle has moderate to severely decreased function. The left ventricle demonstrates global hypokinesis. The left ventricular internal cavity size was mildly dilated. Left ventricular diastolic parameters are consistent with Grade II diastolic dysfunction (pseudonormalization). Elevated left atrial pressure.  2. Right ventricular systolic function was not well visualized. The right ventricular size is normal. Tricuspid regurgitation signal is inadequate for assessing PA pressure.  3. The mitral valve is grossly normal. No evidence of mitral valve regurgitation. No evidence of mitral stenosis.  4. The aortic valve is grossly normal. Aortic valve regurgitation is not visualized. No aortic stenosis is present. Comparison(s): Prior images reviewed side by side. The left ventricular function is significantly worse. The left ventricular wall motion abnormalities are new. Consider serial studies, with Definity. FINDINGS  Left Ventricle: Technically difficult study, even  with Definity contrast. There is no left ventricular thrombus. There is relatively preserved systolic  function in the basal segments. Findings suggest stress cardiomyopathy (takotsubo sd.), but could well represent multivessel CAD. Left ventricular ejection fraction, by estimation, is 30 to 35%. The left ventricle has moderate to severely decreased function. The left ventricle demonstrates global hypokinesis. Definity contrast agent was given IV to delineate the left ventricular endocardial borders. The left ventricular internal cavity size was mildly dilated. There is no left ventricular hypertrophy. Left ventricular diastolic parameters are consistent with Grade II diastolic dysfunction (pseudonormalization). Elevated left atrial pressure. Right Ventricle: The right ventricular size is normal. No increase in right ventricular wall thickness. Right ventricular systolic function was not well visualized. Tricuspid regurgitation signal is inadequate for assessing PA pressure. Left Atrium: Left atrial size was normal in size. Right Atrium: Right atrial size was normal in size. Pericardium: There is no evidence of pericardial effusion. Mitral Valve: The mitral valve is grossly normal. No evidence of mitral valve regurgitation. No evidence of mitral valve stenosis. Tricuspid Valve: The tricuspid valve is normal in structure. Tricuspid valve regurgitation is not demonstrated. Aortic Valve: The aortic valve is grossly normal. Aortic valve regurgitation is not visualized. No aortic stenosis is present. Pulmonic Valve: The pulmonic valve was not well visualized. Pulmonic valve regurgitation is not visualized. Aorta: The aortic root is normal in size and structure. IAS/Shunts: No atrial level shunt detected by color flow Doppler.  LEFT VENTRICLE PLAX 2D LVIDd:         5.50 cm  Diastology LVIDs:         4.20 cm  LV e' lateral:   7.51 cm/s LV PW:         0.90 cm  LV E/e' lateral: 11.5 LV IVS:        1.10 cm LVOT diam:     2.40 cm LV SV:         80 LV SV Index:   41 LVOT Area:     4.52 cm  LEFT ATRIUM           Index LA diam:       3.70 cm 1.90 cm/m LA Vol (A4C): 47.7 ml 24.44 ml/m  AORTIC VALVE LVOT Vmax:   96.60 cm/s LVOT Vmean:  70.600 cm/s LVOT VTI:    0.176 m  AORTA Ao Root diam: 2.70 cm MITRAL VALVE MV Area (PHT): 3.54 cm    SHUNTS MV Decel Time: 214 msec    Systemic VTI:  0.18 m MV E velocity: 86.50 cm/s  Systemic Diam: 2.40 cm MV A velocity: 72.00 cm/s MV E/A ratio:  1.20 Mihai Croitoru MD Electronically signed by Thurmon Fair MD Signature Date/Time: 01/31/2020/4:22:37 PM    Final     Cardiac Studies   Echocardiogram 01/31/2020: Impressions: 1. Technically difficult study, even with Definity contrast. There is no  left ventricular thrombus. There is relatively preserved systolic function  in the basal segments. Findings suggest stress cardiomyopathy (takotsubo  sd.), but could well represent  multivessel CAD. Left ventricular ejection fraction, by estimation, is 30  to 35%. The left ventricle has moderate to severely decreased function.  The left ventricle demonstrates global hypokinesis. The left ventricular  internal cavity size was mildly  dilated. Left ventricular diastolic parameters are consistent with Grade  II diastolic dysfunction (pseudonormalization). Elevated left atrial  pressure.  2. Right ventricular systolic function was not well visualized. The right  ventricular size is normal. Tricuspid regurgitation signal is inadequate  for assessing  PA pressure.  3. The mitral valve is grossly normal. No evidence of mitral valve  regurgitation. No evidence of mitral stenosis.  4. The aortic valve is grossly normal. Aortic valve regurgitation is not  visualized. No aortic stenosis is present.   Comparison(s): Prior images reviewed side by side. The left ventricular  function is significantly worse. The left ventricular wall motion  abnormalities are new. Consider serial studies, with Definity.   Patient Profile     59 y.o. female with a history of COPD with chronic respiratory failure on 2L of  O2 at home, ongoing tobacco use, obstructive sleep apnea on CPAP, hypertension, diabetes mellitus, chronic pain, bipolar disorder, and morbid obesity but no prior cardiac history who is being seen for evaluation of elevated troponin after being admitted for COPD exacerbation at the request of Dr. Chipper HerbZhang.  Assessment & Plan    NSTEMI vs Demand Ischemia in Setting of Acute Hypercapnic Respiratory Failure Likely Stress Induced Cardiomyopathy with EF of 30-35% - High-sensitivity troponin elevated at 336 >>1,179 >>1,655 >>2,853 >> 3,204.  - EKG shows old anterior infarct but no acute ischemic changes. - Echo showed LVEF of 30-35% with akinesis across several coronary artery distributions but relatively preserved systolic function in the basal segment suggestive of stress cardiomyopathy. - Patient denies any chest pain. - Received one dose of IV Lasix on 01/31/2020. Does not appear volume overloaded. No additional diuresis needed at this time. - She was treated with IV Heparin for 48 hours. - Continue Aspirin. Statin on hold due to acute transaminitis. Will add Toprol-XL 12.5mg  daily.  - We were initially planning on left heart catheterization once condition improves. However, given troponin elevation and Echo can be explained by Takotsubo cardiomyopathy, may be best to pursue medical therapy for 3-6 months and then recheck Echo. Will continue to see how patient does.   Acute Hypercapnic and Hypoxic Respiratory Failure COPD Exacerbation - Patient had worsening respiratory distress last night requiring BiPAP. Patient noted to be extremely anxious at the time.  - Chest CT yesterday showed mixed ground-glass, consolidation, and tree-in-bud nodularity predominantly in the right lower lobe and to a lesser extent right upper lobe. This could reflect acute multifocal infection or inflammatory process or sequela of aspiration. Also had superimposed features of septal thickening and vascular  redistribution/congestion with trace effusions which may suggest some superimposed edematous changes.  - On Azithromycin, Prednisone, and breathing treatments. - Pulmonology now following. Management per primary team and Pulmonology.  Wide-Complex Tachycardia/ SVT with Abberancy - During episode of worsening respiratory on 01/31/2020, she went into likely SVT with RBBB morphology. Dr. Flora Lipps'Neal discussed with EP who did not feel this was VT. - No recurrence. - Toprol-XL 12.5mg  daily started on 01/31/2200. Continue. - Will continue to monitor on telemetry.  Hypertension - BP soft but stable.  - Continue Toprol-XL as above. - Continue to hold Spironolactone and Lasix on hold due to soft BP and AKI.  AKI - Creatinine 1.66 on admission and slowly coming down. 1.49 yesterday. Today's labs pending. Baseline around 1.1.  - Continue to avoid nephrotoxic agents.  - Continue to monitor closely.   Acute Transaminitis  - AST and ALT peaked at 1,344 and 1,577 respectively and now slowly coming down. Today's labs pending. - RUQ ultrasound showed hepatic steatosis but no focal hepatic lesions. - Hepatitis panel negative. - CMV and EBV pending. - Management per primary team.   Diabetes Mellitus  - Hemoglobin A1c 6.2 this admission. - Management per primary team.  Dysphagia - Management per primary team.  Tobacco Abuse - Will need to continue to emphasize importance of complete cessation.  For questions or updates, please contact [facility] Please consult [email or web address] for contact info under        Signed, Corrin Parker, PA-C  02/02/2020, 8:00 AM

## 2020-02-02 NOTE — Progress Notes (Signed)
Occupational Therapy Treatment Patient Details Name: Brenda [name] MRN: [id number] DOB: 12-Mar-1961 Today's Date: 02/02/2020    History of present illness Pt is 59 yo female with PMH including resp failure on 2-3 L O2, COPD, OSA, DM2, chronic pain, depression/anxiety, and obesity.  Pt presented with dyspnea and admitted for COPD exacerbation.  Pt found to have elevate troponins and cardiology consulted. EKG negative for acute changes.   Per cardiology demand ischemia vs NSTEMI but no chest pain so favoring demand ischemia.   OT comments  Pt. Seen for skilled OT treatment session.  Provided and reviewed energy conservation handouts.  Reviewed at length with additional suggestions and examples.  Also assisted pt. With trouble shooting some of her reported bigger struggle times ie: meal prep.  Also reviewed PLB, pt. With good return demo.    Follow Up Recommendations  No OT follow up    Equipment Recommendations  None recommended by OT    Recommendations for Other Services      Precautions / Restrictions Precautions Precaution Comments: anxiety; monitor O2       Mobility Bed Mobility                  Transfers                      Balance                                           ADL either performed or assessed with clinical judgement   ADL                                         General ADL Comments: education provided along with handouts for energy conservation. pt. she has a shower chair she uses to sit for bathing.  reviewed having all items gathered for b/d.  pt. reports her greatest struggle is trying to do meal prep and watch her 22yr old grandson.  states they do have someone staying with them that "can" help keep an eye on him.  suggested seated meal prep during his afternoon nap. she verbalized understanding. also staetes she does a lot of crock pot cooking that also aides in energy conservation.  states she knows she is  trying to do to much.  son and dtr. in law live with her with the 84 year old grandson and an adult roomate that is now living there too.  reviewed there is plenty of help available and she needs to divide and delegate some of the chores. she states vacuming and sweeping are very hard for her. suggested requesting help and delegating those chores out.  she verbalized understanding and states she is going to make some changes.  acknologes it is difficult because she is used to doing everything but also states she realizes she isnt accomplishing what she would like to.  Reviewed PLB, and discussed not talking while walking and eating.  She said "I know I tell people I either eat or breathe I cant do both"     Vision       Perception     Praxis      Cognition  Exercises     Shoulder Instructions       General Comments      Pertinent Vitals/ Pain       Pain Assessment: No/denies pain  Home Living                                          Prior Functioning/Environment              Frequency  Min 2X/week        Progress Toward Goals  OT Goals(current goals can now be found in the care plan section)  Progress towards OT goals: Progressing toward goals  ADL Goals Pt Will Perform Lower Body Bathing: with modified independence;sit to/from stand Pt Will Perform Lower Body Dressing: with modified independence;sit to/from stand Pt/caregiver will Perform Home Exercise Program: Both right and left upper extremity;With theraband;Independently;With written HEP provided Additional ADL Goal #1: Pt will independently state 3 energy conservaiton strategies to implement during ADL/IADL tasks  Plan      Co-evaluation                 AM-PAC OT "6 Clicks" Daily Activity     Outcome Measure   Help from another person eating meals?: None Help from another person taking care of personal  grooming?: A Little Help from another person toileting, which includes using toliet, bedpan, or urinal?: A Little Help from another person bathing (including washing, rinsing, drying)?: A Little Help from another person to put on and taking off regular upper body clothing?: A Little Help from another person to put on and taking off regular lower body clothing?: A Little 6 Click Score: 19    End of Session Equipment Utilized During Treatment: Oxygen  OT Visit Diagnosis: Unsteadiness on feet (R26.81);Other (comment)   Activity Tolerance Patient tolerated treatment well;Other (comment)   Patient Left in bed;with call bell/phone within reach   Nurse Communication Other (comment) (spoke with RN she states it is ok to work with pt.)        Time: 8182-9937 OT Time Calculation (min): 14 min  Charges: OT General Charges $OT Visit: 1 Visit OT Treatments $Self Care/Home Management : 8-22 mins  Brenda [name], COTA/L Acute Rehabilitation [phone]   Brenda [name] 02/02/2020, 11:11 AM

## 2020-02-02 NOTE — Progress Notes (Signed)
Physical Therapy Treatment Patient Details Name: Brenda [name] MRN: [id number] DOB: 1960-12-05 Today's Date: 02/02/2020    History of Present Illness  Brenda [name] is a 59 y.o. female with medical history significant of chronic hypoxic respiratory failure on 2 to 3 L baseline O2 plus CPAP at bedtime, COPD Gold stage III (FEV1 44%, improved with bronchodilator to 50%, 2019), chronic cigarette smoker 5-10 cigarettes a day hypertension, OSA on CPAP at bedtime, IIDM, diabetic neuropathy, chronic pain, depression/anxiety, morbid obesity, presented with increasing short of breath. Pt also with elevated troponins, EKG negative for acute changes.     PT Comments    Pt is making good progress towards her goals today, however pt continues to be limited in safe mobility by 4/4 DoE with short distance ambulation. Pt is also experiencing some generalized weakness. Pt is mod I for bed mobility and min guard for transfers and ambulation of 40 feet. Pt requires sitting rest break to recover despite SaO2 >90%O2. D/c plans remain appropriate. PT recommending Rollator for discharge to facilitate energy conservation with mobility. PT will continue to follow acutely.     Follow Up Recommendations  No PT follow up;Supervision - Intermittent     Equipment Recommendations  Other (comment) (Rollator)       Precautions / Restrictions Precautions Precautions: Fall;Other (comment) Precaution Comments: monitor O2, low fall    Mobility  Bed Mobility Overal bed mobility: Modified Independent             General bed mobility comments: able to come to longsitting in bed and then swing her LE off the bed   Transfers Overall transfer level: Needs assistance Equipment used: None Transfers: Sit to/from [employer] Transfers Sit to Stand: Min guard         General transfer comment: min guard for safety, good power up and self steadying  Ambulation/Gait Ambulation/Gait assistance: Min guard Gait  Distance (Feet): 40 Feet Assistive device: None;IV Pole Gait Pattern/deviations: Step-through pattern;Decreased stride length;Shuffle Gait velocity: slowed   General Gait Details: min guard for safety with slow, shuffling gait, initiates gait without AD on second trip from bed to door pt steadies herself with the IV pole, pt reports she is a "toucher" to keep her balance       Balance Overall balance assessment: Needs assistance Sitting-balance support: No upper extremity supported;Feet supported Sitting balance-Leahy Scale: Good     Standing balance support: No upper extremity supported;During functional activity Standing balance-Leahy Scale: Good                              Cognition Arousal/Alertness: Awake/alert Behavior During Therapy: Anxious Overall Cognitive Status: Within Functional Limits for tasks assessed                                           General Comments General comments (skin integrity, edema, etc.): SaO2 on 3L O2 via [place] >90%O2 with ambulation, however pt experiences 4/4 DoE at end of 40 feet, cued in purse lip breathing and able to recover her breath in sitting      Pertinent Vitals/Pain Pain Assessment: No/denies pain           PT Goals (current goals can now be found in the care plan section) Acute Rehab PT Goals Patient Stated Goal: feel better and go home PT Goal  Formulation: With patient/family Time For Goal Achievement: 02/14/20 Potential to Achieve Goals: Good Progress towards PT goals: Progressing toward goals    Frequency    Min 3X/week      PT Plan Current plan remains appropriate       AM-PAC PT "6 Clicks" Mobility   Outcome Measure  Help needed turning from your back to your side while in a flat bed without using bedrails?: None Help needed moving from lying on your back to sitting on the side of a flat bed without using bedrails?: None Help needed moving to and from a bed to a chair  (including a wheelchair)?: None Help needed standing up from a chair using your arms (e.g., wheelchair or bedside chair)?: None Help needed to walk in hospital room?: None Help needed climbing 3-5 steps with a railing? : A Little 6 Click Score: 23    End of Session Equipment Utilized During Treatment: Gait belt Activity Tolerance: Patient tolerated treatment well Patient left: in chair;with call bell/phone within reach;with chair alarm set Nurse Communication: Mobility status PT Visit Diagnosis: Muscle weakness (generalized) (M62.81);Other abnormalities of gait and mobility (R26.89)     Time: 1433-1510 PT Time Calculation (min) (ACUTE ONLY): 37 min  Charges:  $Therapeutic Exercise: 8-22 mins $Therapeutic Activity: 8-22 mins                     Creola Krotz B. Beverely Risen PT, DPT Acute Rehabilitation Services Pager [phone] Office [phone]    Elon Alas Fleet 02/02/2020, 3:56 PM

## 2020-02-02 NOTE — Progress Notes (Addendum)
Modified Barium Swallow Progress Note  Patient Details  Name: Brenda [name] MRN: [id number] Date of Birth: 1960/07/21  Today's Date: 02/02/2020  Modified Barium Swallow completed.  Full report located under Chart Review in the Imaging Section.  Brief recommendations include the following:  Clinical Impression  Pt's oropharyngeal swallow mechanism was within functional limits with transient penetration (PAS 2) of thin liquids when consecutive swallows were used. Esophageal screening revealed backflow of material from the lower to upper thoracic esophagus and mild esophageal stasis. Backflow to the pharynx was not observed while the fluoro was on. However, pt demonstrated subsequent coughing and the possibility of aspiration secondary to backflowed material reaching the cervical esophagus and pharynx is suspected. Considering the results of the esophageal screening combined with her reported symptoms, assessment (e.g., esophagram) of the esophageal phase of her swallow is recommended. Pt was educated regarding results and recommendations as well as strategies to reduce aspiration risk during episodes of dyspnea. She verbalized understanding. Further skilled SLP services are not clinically indicated at this time.    Swallow Evaluation Recommendations       SLP Diet Recommendations: Regular solids;Thin liquid   Liquid Administration via: Cup;Straw   Medication Administration: Whole meds with liquid   Supervision: Patient able to self feed   Compensations: Slow rate;Small sips/bites               Brenda I. Vear Clock, MS, CCC-SLP Acute Rehabilitation Services Office number [phone] Pager [phone]  Brenda [name] 02/02/2020,10:19 AM

## 2020-02-02 NOTE — Evaluation (Signed)
Clinical/Bedside Swallow Evaluation Patient Details  Name: Brenda [name] MRN: [id number] Date of Birth: 1960-11-16  Today's Date: 02/02/2020 Time: SLP Start Time (ACUTE ONLY): 0902 SLP Stop Time (ACUTE ONLY): 0920 SLP Time Calculation (min) (ACUTE ONLY): 18 min  Past Medical History:  Past Medical History:  Diagnosis Date  . Acute pancreatitis   . Asthma    main dyspnea thought due to deconditioning (10/2013)  . Bipolar I disorder, most recent episode (or current) manic, unspecified    pt denies this  . Bronchitis, chronic obstructive (HCC) 2008 & 2009   FeV1 64% TLC 105% DLCO 55% 2008  -  FeV1 81% FeF 25-75 43% 2009  . COPD (chronic obstructive pulmonary disease) (HCC)    emphysema, not an issue per pulm (10/2013)  . History of pyelonephritis 05/2012  . HTN (hypertension)   . Hx of migraines   . Knee pain, right   . Leukocytosis, unspecified   . Lumbar disc disease with radiculopathy    multilevel spondylitic changes, scoliosis, and anterolisthesis L4 not thought to currently be surg candidate (Dr. Phoebe Perch, [facility]) (Dr. Ollen Bowl, [place])  . Nicotine addiction   . Nonspecific abnormal electrocardiogram (ECG) (EKG)   . Obesity   . Pure hyperglyceridemia   . Suicide attempt (HCC) 06/2001  . Swelling of limb   . Urge and stress incontinence 07/2012   ([facility])   Past Surgical History:  Past Surgical History:  Procedure Laterality Date  . CHOLECYSTECTOMY  07/1982  . CT MAXILLOFACIAL WO/W CM  06/14/06   Left max mucocele  . CT of chest  06/14/2006   Normal except c/w active inflammation / infection.  Left renal atrophy  . ERCP / sphincterotomy - stenosis  01/15/06  . lumbar MRI  11/2010   multilevel spondylitic changes with upper lumbar scoliosis and anterolisthesis of L4 on 5 with some L foraminal narrowing esp at L/3 with concavity of scoliosis, some central stenosis and biforaminal narrowing at L4/5 with spondylolisthesis  . [place] - Pneumonia, acute asthma, left maxillary  sinusitis  01/11 - 06/18/2006  . Retained stone     ERCP secondary to retained stone  . Right knee surgery  1984,1989,1989,1991,1994   Reconstructions for ACL insuff  . TUBAL LIGATION     HPI:  Pt is a 59 y.o. female with medical history significant of chronic hypoxic respiratory failure on 2 to 3 L baseline O2 and CPAP at bedtime, COPD Gold stage III (FEV1 44%, improved with bronchodilator to 50%, 2019), chronic cigarette smoker 5-10 cigarettes a day hypertension, OSA on CPAP at bedtime, IIDM, diabetic neuropathy, chronic pain, depression/anxiety, morbid obesity who presented to the ED with increasing short of breath. Rapid response on 9/1 due to respiratory distress and was placed on non rebreather mask. CT chest 9/1: Mixed ground-glass, consolidation and tree-in-bud nodularity predominantly in the right lower lobe and to a lesser extent the posterior segment right upper lobe. Findings could reflect an acute multifocal infection or inflammatory process or sequela of aspiration. Pt reported "getting choked on food often" to Dr. Katrinka Blazing.   Assessment / Plan / Recommendation Clinical Impression  Pt was seen for bedside swallow evaluation. She reported symptoms of esophageal dysphagia characterized by the sensation of solids and liquids "sticking" in her mid chest area 2-3 times per week with subsequent "relaxing". She denied any signs of aspiration with p.o. intake. Oral mechanism exam was [facility] and dentition was adequate. She inconsistently exhibited a delayed cough following thin liquids. However, considering her baseline cough,  the possibility of this being unrelated is considered. No other symptoms of oropharyngeal dysphagia were demonstrated. A modified barium swallow study will be conducted to further assess physiology. However, considering her symptoms, esophageal assessment (e.g., an esophagram) may also be beneficial.  SLP Visit Diagnosis: Dysphagia, unspecified (R13.10)    Aspiration Risk  Mild  aspiration risk    Diet Recommendation Regular;Thin liquid   Liquid Administration via: Cup;Straw Medication Administration: Whole meds with liquid Supervision: Patient able to self feed Compensations: Slow rate;Small sips/bites Postural Changes: Seated upright at 90 degrees    Other  Recommendations Oral Care Recommendations: Oral care BID   Follow up Recommendations Other (comment) (TBD)      Frequency and Duration min 2x/week  1 week       Prognosis Prognosis for Safe Diet Advancement: Good      Swallow Study   General Date of Onset: 02/01/20 HPI: Pt is a 59 y.o. female with medical history significant of chronic hypoxic respiratory failure on 2 to 3 L baseline O2 and CPAP at bedtime, COPD Gold stage III (FEV1 44%, improved with bronchodilator to 50%, 2019), chronic cigarette smoker 5-10 cigarettes a day hypertension, OSA on CPAP at bedtime, IIDM, diabetic neuropathy, chronic pain, depression/anxiety, morbid obesity who presented to the ED with increasing short of breath. Rapid response on 9/1 due to respiratory distress and was placed on non rebreather mask. CT chest 9/1: Mixed ground-glass, consolidation and tree-in-bud nodularity predominantly in the right lower lobe and to a lesser extent the posterior segment right upper lobe. Findings could reflect an acute multifocal infection or inflammatory process or sequela of aspiration. Pt reported "getting choked on food often" to Dr. Katrinka Blazing. Type of Study: Bedside Swallow Evaluation Previous Swallow Assessment: none Diet Prior to this Study: Regular;Thin liquids Temperature Spikes Noted: No Respiratory Status: Nasal cannula History of Recent Intubation: No Behavior/Cognition: Alert;Cooperative;Pleasant mood Oral Cavity Assessment: Within Functional Limits Oral Care Completed by SLP: No Oral Cavity - Dentition: Adequate natural dentition;Dentures, top Vision: Functional for self-feeding Self-Feeding Abilities: Able to feed  self Patient Positioning: Upright in bed;Postural control adequate for testing Baseline Vocal Quality: Normal Volitional Cough: Strong Volitional Swallow: Able to elicit    Oral/Motor/Sensory Function Overall Oral Motor/Sensory Function: Within functional limits   Ice Chips Ice chips: Within functional limits Presentation: Spoon   Thin Liquid Thin Liquid: Impaired Presentation: Cup;Straw Pharyngeal  Phase Impairments: Cough - Delayed    Nectar Thick Nectar Thick Liquid: Not tested   Honey Thick Honey Thick Liquid: Not tested   Puree Puree: Within functional limits Presentation: Spoon   Solid     Solid: Within functional limits Presentation: Self Fed     Shanetta Nicolls I. Vear Clock, MS, CCC-SLP Acute Rehabilitation Services Office number [phone] Pager [phone]  Scheryl Marten 02/02/2020,9:23 AM

## 2020-02-02 NOTE — Progress Notes (Signed)
PROGRESS NOTE    Brenda [name]  CXK:[id number] DOB: [birth year] DOA: 01/30/2020 PCP: [name], MD   Brief Narrative: Brenda [name] is a 59 y.o. female with medical history significant of chronic hypoxic respiratory failure on 2 to 3 L baseline O2 plus CPAP at bedtime, COPD Gold stage III (FEV1 44%, improved with bronchodilator to 50%, 2019), chronic cigarette smoker 5-10 cigarettes a day hypertension, OSA on CPAP at bedtime, IIDM, diabetic neuropathy, chronic pain, depression/anxiety, morbid obesity. Patient presented secondary severe dyspnea with concern for COPD exacerbation. Also found to have evidence of possible NSTEMI in addition to labs concerning for acute hepatitis.   Assessment & Plan:   Active Problems:   COPD exacerbation (HCC)   Acute respiratory failure with hypoxia (HCC)   COPD (chronic obstructive pulmonary disease) (HCC)   COPD exacerbation Chest x-ray suggests pneumonitis. Patient started empirically on Prednisone, Ceftriaxone and Azithromycin. Patient given BiPAP on admission and hs improved this morning but is far from baseline. Triggers include allergens but patient reports no other history of viral-like illness. CT chest significant for ground-glass opacities bilaterally. Afebrile. -Continue Duonebs, Prednisone -Continue Flonase -Continue Ceftriaxone/Azithromcin -Pulmonology recommendations: RVP pending. Recommendations: started Tamiflu -Will obtain influenza panel  Acute on chronic respiratory failure with hypoxia Patient is on 2 L via [place] chronically at home. In setting of above. -Wean to baseline as able. Goals SpO2 >88% -BiPAP as needed to respiratory distress  Elevated troponin Possible stressed induced cardiomyopathy per cardiology. Last troponin of 3204. -Cardiology recommendations: likely medical treatment with Aspirin, statin (once LFTs improved)  Leukocytosis Likely secondary to infection. Differential on admission significant for  lymphocytosis and monocytosis initially which have now resolved. Patient now has developed neutrophilia which may be related to infection vs steroids. CMV negative. EBV IgG positive but IgM is negative; unlikely acute infection. Labs pending today. -Daily CBC w/ Diff  AKI Baseline creatinine of 1.1. Creatinine of 1.66 on admission and is improving slowly. -BMP in AM (still pending)  Elevated AST/ALT Ultrasound significant for hepatic steatosis. No associated hyperbilirubinemia. Trending upwards. Hepatitis panel obtained and is negative. Patient is on Lipitor as an outpatient which could also be the etiology behind rise; no associated myalgias. No hypotension noted.  OSA -Continue CPAP qhs  Anxiety -continue Ativan prn  Diabetes mellitus, type 2 Patient is on metformin as an outpatient. Last hemoglobin A1C of 6.2% -Continue SSI  Diabetic neuropathy -Continue gabapentin; dose for renal function   DVT prophylaxis: Heparin drip Code Status:   Code Status: Full Code Family Communication: None at bedside Disposition Plan: Discharge likely home in several days pending continued workup/management of respiratory/heart and liver disease. PT with recommendations for not PT follow-up and intermittent supervision.   Consultants:   Cardiology  Procedures:   TRANSTHORACIC ECHOCARDIOGRAM (01/31/2020) IMPRESSIONS    1. Technically difficult study, even with Definity contrast. There is no  left ventricular thrombus. There is relatively preserved systolic function  in the basal segments. Findings suggest stress cardiomyopathy (takotsubo  sd.), but could well represent  multivessel CAD. Left ventricular ejection fraction, by estimation, is 30  to 35%. The left ventricle has moderate to severely decreased function.  The left ventricle demonstrates global hypokinesis. The left ventricular  internal cavity size was mildly  dilated. Left ventricular diastolic parameters are consistent with  Grade  II diastolic dysfunction (pseudonormalization). Elevated left atrial  pressure.  2. Right ventricular systolic function was not well visualized. The right  ventricular size is normal. Tricuspid regurgitation signal is inadequate  for assessing PA pressure.  3. The mitral valve is grossly normal. No evidence of mitral valve  regurgitation. No evidence of mitral stenosis.  4. The aortic valve is grossly normal. Aortic valve regurgitation is not  visualized. No aortic stenosis is present.   Antimicrobials:  Azithromycin  Ceftriaxone    Subjective: Breathing better today but not close to baseline.  Objective: Vitals:   02/02/20 0756 02/02/20 0917 02/02/20 1140 02/02/20 1142  BP: 96/69   92/62  Pulse: 72 82 68 71  Resp: 20 20 20 20   Temp: (!) 97.5 F (36.4 C)  (!) 97.5 F (36.4 C)   TempSrc: Oral  Oral   SpO2: 100% 100% 98% 99%  Weight:      Height:        Intake/Output Summary (Last 24 hours) at 02/02/2020 1512 Last data filed at 02/01/2020 2251 Gross per 24 hour  Intake 825.63 ml  Output 859 ml  Net -33.37 ml   Filed Weights   01/31/20 1100 02/01/20 0500 02/02/20 0629  Weight: 99.8 kg 100.9 kg 98.7 kg    Examination:  General exam: Appears calm and comfortable Respiratory system: More air movement with mild expiratory wheezing. Cardiovascular system: S1 & S2 heard, RRR. No murmurs, rubs, gallops or clicks. Gastrointestinal system: Abdomen is nondistended, soft and nontender. No organomegaly or masses felt. Normal bowel sounds heard. Central nervous system: Alert and oriented. No focal neurological deficits. Musculoskeletal: No edema. No calf tenderness Skin: No cyanosis. No rashes Psychiatry: Judgement and insight appear normal. Mood & affect appropriate.     Data Reviewed: I have personally reviewed following labs and imaging studies  CBC Lab Results  Component Value Date   WBC 19.7 (H) 02/01/2020   RBC 4.42 02/01/2020   HGB 13.5 02/01/2020    HCT 41.2 02/01/2020   MCV 93.2 02/01/2020   MCH 30.5 02/01/2020   PLT 151 02/01/2020   MCHC 32.8 02/01/2020   RDW 13.6 02/01/2020   LYMPHSABS 2.4 02/01/2020   MONOABS 1.1 (H) 02/01/2020   EOSABS 0.0 02/01/2020   BASOSABS 0.0 02/01/2020     Last metabolic panel Lab Results  Component Value Date   NA 138 02/01/2020   K 4.2 02/01/2020   CL 102 02/01/2020   CO2 25 02/01/2020   BUN 35 (H) 02/01/2020   CREATININE 1.49 (H) 02/01/2020   GLUCOSE 118 (H) 02/01/2020   GFRNONAA 38 (L) 02/01/2020   GFRAA 43 (L) 02/01/2020   CALCIUM 9.1 02/01/2020   PHOS 3.4 07/26/2015   PROT 6.9 02/01/2020   ALBUMIN 3.5 02/01/2020   BILITOT 0.7 02/01/2020   ALKPHOS 100 02/01/2020   AST 718 (H) 02/01/2020   ALT 1,471 (H) 02/01/2020   ANIONGAP 11 02/01/2020    CBG (last 3)  Recent Labs    02/01/20 2217 02/02/20 0752 02/02/20 1138  GLUCAP 148* 108* 134*     GFR: Estimated Creatinine Clearance: 43.1 mL/min (A) (by C-G formula based on SCr of 1.49 mg/dL (H)).  Coagulation Profile: No results for input(s): INR, PROTIME in the last 168 hours.  Recent Results (from the past 240 hour(s))  SARS Coronavirus 2 by RT PCR (hospital order, performed in [facility] hospital lab) Nasopharyngeal Nasopharyngeal Swab     Status: None   Collection Time: 01/30/20  9:52 AM   Specimen: Nasopharyngeal Swab  Result Value Ref Range Status   SARS Coronavirus 2 NEGATIVE NEGATIVE Final    Comment: (NOTE) SARS-CoV-2 target nucleic acids are NOT DETECTED.  The SARS-CoV-2 RNA  is generally detectable in upper and lower respiratory specimens during the acute phase of infection. The lowest concentration of SARS-CoV-2 viral copies this assay can detect is 250 copies / mL. A negative result does not preclude SARS-CoV-2 infection and should not be used as the sole basis for treatment or other patient management decisions.  A negative result may occur with improper specimen collection / handling, submission of specimen  other than nasopharyngeal swab, presence of viral mutation(s) within the areas targeted by this assay, and inadequate number of viral copies (<250 copies / mL). A negative result must be combined with clinical observations, patient history, and epidemiological information.  Fact Sheet for Patients:   BoilerBrush.com.cy  Fact Sheet for Healthcare Providers: https://pope.com/  This test is not yet approved or  cleared by the [place] FDA and has been authorized for detection and/or diagnosis of SARS-CoV-2 by FDA under an Emergency Use Authorization (EUA).  This EUA will remain in effect (meaning this test can be used) for the duration of the COVID-19 declaration under Section 564(b)(1) of the Act, 21 U.S.C. section 360bbb-3(b)(1), unless the authorization is terminated or revoked sooner.  Performed at [facility], [address], [place], [place] [address]   Expectorated sputum assessment w rflx to resp cult     Status: None   Collection Time: 01/31/20  4:48 AM   Specimen: Expectorated Sputum  Result Value Ref Range Status   Specimen Description EXPECTORATED SPUTUM  Final   Special Requests NONE  Final   Sputum evaluation   Final    Sputum specimen not acceptable for testing.  Please recollect.   RESULT CALLED TO, READ BACK BY AND VERIFIED WITH: C GOAD 01/31/20 0557 JDW Performed at [facility], [address], [place], [place] [address]    Report Status 01/31/2020 FINAL  Final  MRSA PCR Screening     Status: None   Collection Time: 01/31/20  8:46 PM   Specimen: Nasal Mucosa; Nasopharyngeal  Result Value Ref Range Status   MRSA by PCR NEGATIVE NEGATIVE Final    Comment:        The GeneXpert MRSA Assay (FDA approved for NASAL specimens only), is one component of a comprehensive MRSA colonization surveillance program. It is not intended to diagnose MRSA infection nor to guide or monitor treatment for MRSA  infections. Performed at [facility], [address], [place], [place] [address]         Radiology Studies: CT CHEST WO CONTRAST  Result Date: 01/31/2020 CLINICAL DATA:  Respiratory distress, COPD exacerbation EXAM: CT CHEST WITHOUT CONTRAST TECHNIQUE: Multidetector CT imaging of the chest was performed following the standard protocol without IV contrast. COMPARISON:  Radiograph 01/30/2020, CT 09/07/2017 FINDINGS: Cardiovascular: Normal cardiac size. No pericardial effusion. Few scattered coronary artery calcifications are present. Atherosclerotic calcifications present in the normal caliber thoracic aorta and proximal great vessels which branch normally from the aortic arch. No periaortic stranding or hemorrhage. Central pulmonary arteries are normal caliber. Luminal evaluation precluded in the absence of contrast media. No major venous abnormalities are seen. Mediastinum/Nodes: No mediastinal fluid or gas. Normal thyroid gland and thoracic inlet. No acute abnormality of the trachea or esophagus. No worrisome mediastinal or axillary adenopathy.Few prominent paratracheal nodes are present but not significantly changed from prior including a 9 mm lymph node with preserved fatty hilum (3/20). Hilar nodal evaluation is limited in the absence of intravenous contrast media. Lungs/Pleura: There is diffuse mild airways thickening. Mixed ground-glass, consolidation  and tree-in-bud nodularity is present predominantly in the right lower lobe and posterior segment right upper lobe to a lesser extent. Some mild interlobular septal and fissural thickening is noted as well with trace bilateral effusions. Central pulmonary vascular congestion is present with redistributed pulmonary vascularity. Additional bandlike areas of opacity may reflect atelectatic change or scarring. No pneumothorax. Few scattered calcified granulomata. No concerning pulmonary nodules or masses within the limitations of parenchymal disease  though follow-up to resolution should be considered. Upper Abdomen: Patient appears to be post cholecystectomy. Interposition of the hepatic flexure anterior to the liver. Stable lobular thickening of the left adrenal gland. Musculoskeletal: Multilevel degenerative changes are present in the imaged portions of the spine. Some sclerotic likely Modic type endplate changes noted throughout the mid to lower thoracic levels. No acute or concerning osseous lesions. No worrisome chest wall masses or lesions. Mild body wall edema is noted. IMPRESSION: 1. Mixed ground-glass, consolidation and tree-in-bud nodularity predominantly in the right lower lobe and to a lesser extent the posterior segment right upper lobe. Findings could reflect an acute multifocal infection or inflammatory process or sequela of aspiration. 2. Additional superimposed features of septal thickening and vascular redistribution/congestion with trace effusions may suggest some superimposed edematous change as well. 3. Mild body wall edema. 4. Aortic Atherosclerosis (ICD10-I70.0). 5. Coronary artery calcifications are present. Please note that the presence of coronary artery calcium documents the presence of coronary artery disease, the severity of this disease and any potential stenosis cannot be assessed on this non-gated CT examination. Electronically Signed   By: Kreg ShropshirePrice  DeHay M.D.   On: 01/31/2020 23:56   DG Swallowing Func-Speech Pathology  Result Date: 02/02/2020 Objective Swallowing Evaluation: Type of Study: MBS-Modified Barium Swallow Study  Patient Details Name: Festus BarrenJudy L Scarpati MRN: [id number] Date of Birth: 05-Apr-1961 Today's Date: 02/02/2020 Time: SLP Start Time (ACUTE ONLY): 0949 -SLP Stop Time (ACUTE ONLY): 1003 SLP Time Calculation (min) (ACUTE ONLY): 14 min Past Medical History: Past Medical History: Diagnosis Date . Acute pancreatitis  . Asthma   main dyspnea thought due to deconditioning (10/2013) . Bipolar I disorder, most recent episode (or  current) manic, unspecified   pt denies this . Bronchitis, chronic obstructive (HCC) 2008 & 2009  FeV1 64% TLC 105% DLCO 55% 2008  -  FeV1 81% FeF 25-75 43% 2009 . COPD (chronic obstructive pulmonary disease) (HCC)   emphysema, not an issue per pulm (10/2013) . History of pyelonephritis 05/2012 . HTN (hypertension)  . Hx of migraines  . Knee pain, right  . Leukocytosis, unspecified  . Lumbar disc disease with radiculopathy   multilevel spondylitic changes, scoliosis, and anterolisthesis L4 not thought to currently be surg candidate (Dr. Phoebe PerchHirsch, [facility]) (Dr. Ollen BowlHarkins, [facility]) . Nicotine addiction  . Nonspecific abnormal electrocardiogram (ECG) (EKG)  . Obesity  . Pure hyperglyceridemia  . Suicide attempt (HCC) 06/2001 . Swelling of limb  . Urge and stress incontinence 07/2012  ([facility]) Past Surgical History: Past Surgical History: Procedure Laterality Date . CHOLECYSTECTOMY  07/1982 . CT MAXILLOFACIAL WO/W CM  06/14/06  Left max mucocele . CT of chest  06/14/2006  Normal except c/w active inflammation / infection.  Left renal atrophy . ERCP / sphincterotomy - stenosis  01/15/06 . lumbar MRI  11/2010  multilevel spondylitic changes with upper lumbar scoliosis and anterolisthesis of L4 on 5 with some L foraminal narrowing esp at L/3 with concavity of scoliosis, some central stenosis and biforaminal narrowing at L4/5 with spondylolisthesis . [place] - Pneumonia, acute asthma,  left maxillary sinusitis  01/11 - 06/18/2006 . Retained stone    ERCP secondary to retained stone . Right knee surgery  1984,1989,1989,1991,1994  Reconstructions for ACL insuff . TUBAL LIGATION   HPI: Pt is a 59 y.o. female with medical history significant of chronic hypoxic respiratory failure on 2 to 3 L baseline O2 and CPAP at bedtime, COPD Gold stage III (FEV1 44%, improved with bronchodilator to 50%, 2019), chronic cigarette smoker 5-10 cigarettes a day hypertension, OSA on CPAP at bedtime, IIDM, diabetic neuropathy, chronic pain,  depression/anxiety, morbid obesity who presented to the ED with increasing short of breath. Rapid response on 9/1 due to respiratory distress and was placed on non rebreather mask. CT chest 9/1: Mixed ground-glass, consolidation and tree-in-bud nodularity predominantly in the right lower lobe and to a lesser extent the posterior segment right upper lobe. Findings could reflect an acute multifocal infection or inflammatory process or sequela of aspiration. Pt reported "getting choked on food often" to Dr. Katrinka Blazing.  No data recorded Assessment / Plan / Recommendation CHL IP CLINICAL IMPRESSIONS 02/02/2020 Clinical Impression Pt's oropharyngeal swallow mechanism was within functional limits with transient penetration (PAS 2) of thin liquids when consecutive swallows were used. Esophageal screening revealed backflow of material from the lower to upper thoracic esophagus and mild esophageal stasis. Backflow to the pharynx was not observed while the fluoro was on. However, pt demonstrated subsequent coughing and the possibility of aspiration secondary to backflowed material reaching the cervical esophagus and pharynx is suspected. Considering the results of the esophageal screening combined with her reported symptoms, assessment (e.g., esophagram) of the esophageal phase of her swallow is recommended. Pt was educated regarding results and recommendations as well as strategies to reduce aspiration risk during episodes of dyspnea. She verbalized understanding. Further skilled SLP services are not clinically indicated at this time.  SLP Visit Diagnosis Dysphagia, unspecified (R13.10) Attention and concentration deficit following -- Frontal lobe and executive function deficit following -- Impact on safety and function Mild aspiration risk   CHL IP TREATMENT RECOMMENDATION 02/02/2020 Treatment Recommendations No treatment recommended at this time   Prognosis 02/02/2020 Prognosis for Safe Diet Advancement Good Barriers to Reach Goals  -- Barriers/Prognosis Comment -- CHL IP DIET RECOMMENDATION 02/02/2020 SLP Diet Recommendations Regular solids;Thin liquid Liquid Administration via Cup;Straw Medication Administration Whole meds with liquid Compensations Slow rate;Small sips/bites Postural Changes --   No flowsheet data found.  CHL IP FOLLOW UP RECOMMENDATIONS 02/02/2020 Follow up Recommendations None   CHL IP FREQUENCY AND DURATION 02/02/2020 Speech Therapy Frequency (ACUTE ONLY) min 2x/week Treatment Duration 1 week      CHL IP ORAL PHASE 02/02/2020 Oral Phase WFL Oral - Pudding Teaspoon -- Oral - Pudding Cup -- Oral - Honey Teaspoon -- Oral - Honey Cup -- Oral - Nectar Teaspoon -- Oral - Nectar Cup -- Oral - Nectar Straw -- Oral - Thin Teaspoon -- Oral - Thin Cup -- Oral - Thin Straw -- Oral - Puree -- Oral - Mech Soft -- Oral - Regular -- Oral - Multi-Consistency -- Oral - Pill -- Oral Phase - Comment --  CHL IP PHARYNGEAL PHASE 02/02/2020 Pharyngeal Phase WFL Pharyngeal- Pudding Teaspoon -- Pharyngeal -- Pharyngeal- Pudding Cup -- Pharyngeal -- Pharyngeal- Honey Teaspoon -- Pharyngeal -- Pharyngeal- Honey Cup -- Pharyngeal -- Pharyngeal- Nectar Teaspoon -- Pharyngeal -- Pharyngeal- Nectar Cup -- Pharyngeal -- Pharyngeal- Nectar Straw -- Pharyngeal -- Pharyngeal- Thin Teaspoon -- Pharyngeal -- Pharyngeal- Thin Cup -- Pharyngeal -- Pharyngeal- Thin Straw Penetration/Aspiration during swallow  Pharyngeal Material enters airway, remains ABOVE vocal cords then ejected out Pharyngeal- Puree -- Pharyngeal -- Pharyngeal- Mechanical Soft -- Pharyngeal -- Pharyngeal- Regular -- Pharyngeal -- Pharyngeal- Multi-consistency -- Pharyngeal -- Pharyngeal- Pill -- Pharyngeal -- Pharyngeal Comment --  CHL IP CERVICAL ESOPHAGEAL PHASE 02/02/2020 Cervical Esophageal Phase (No Data) Pudding Teaspoon -- Pudding Cup -- Honey Teaspoon -- Honey Cup -- Nectar Teaspoon -- Nectar Cup -- Nectar Straw -- Thin Teaspoon -- Thin Cup -- Thin Straw -- Puree -- Mechanical Soft -- Regular --  Multi-consistency -- Pill -- Cervical Esophageal Comment -- Shanika I. Vear Clock, MS, CCC-SLP Acute Rehabilitation Services Office number [phone] Pager [phone] Scheryl Marten 02/02/2020, 10:28 AM              DG ESOPHAGUS W SINGLE CM (SOL OR THIN BA)  Result Date: 02/02/2020 CLINICAL DATA:  Shortness of breath weakness. EXAM: ESOPHOGRAM/BARIUM SWALLOW TECHNIQUE: Single contrast examination was performed using  thin barium. FLUOROSCOPY TIME:  Fluoroscopy Time:  1 minutes 42 seconds Radiation Exposure Index (if provided by the fluoroscopic device): 14.3 mGy Number of Acquired Spot Images: 0 COMPARISON:  Chest CT of 01/31/2020 FINDINGS: Esophagus grossly normal. Limited assessment in semi recumbent positioning as the patient could not stand for the evaluation. Distensibility is normal. Mild deviation near the cardiac silhouette likely due to LEFT atrial enlargement based on comparison with recent CT. Motility could not be fully assessed as the patient was unable to lie in the prone RAO position due to current dyspnea. However, on the last swallow obtained there was proximal escape of the bolus with head of bed slightly elevated in moderate tertiary peristaltic activity compatible with esophageal dysmotility. IMPRESSION: 1. Limited study without abnormality aside from signs of mild-to-moderate dysmotility. If there are continued symptoms that would suggest esophageal pathology a study as an outpatient could be performed for more complete assessment when the patient has recovered from this episode of acute illness. Electronically Signed   By: Donzetta Kohut M.D.   On: 02/02/2020 13:39        Scheduled Meds: . arformoterol  15 mcg Nebulization BID  . aspirin EC  81 mg Oral Daily  . azithromycin  500 mg Oral Daily  . budesonide (PULMICORT) nebulizer solution  0.5 mg Nebulization BID  . escitalopram  30 mg Oral Daily  . feeding supplement (GLUCERNA SHAKE)  237 mL Oral BID BM  . fluticasone  1  spray Each Nare BID  . gabapentin  600 mg Oral Daily  . gabapentin  900 mg Oral QHS  . insulin aspart  0-5 Units Subcutaneous QHS  . insulin aspart  0-9 Units Subcutaneous TID WC  . insulin aspart  5 Units Subcutaneous TID WC  . ipratropium-albuterol  3 mL Nebulization Q6H  . metoprolol succinate  12.5 mg Oral Daily  . multivitamin with minerals  1 tablet Oral Daily  . nicotine  14 mg Transdermal Daily  . ondansetron (ZOFRAN) IV  4 mg Intravenous Once  . oseltamivir  30 mg Oral BID  . pantoprazole  40 mg Oral QAC breakfast  . predniSONE  40 mg Oral Q breakfast  . revefenacin  175 mcg Nebulization Daily   Continuous Infusions: . cefTRIAXone (ROCEPHIN)  IV 1 g (02/02/20 1359)     LOS: 3 days     Jacquelin Hawking, MD [facility] 02/02/2020, 3:12 PM  If 7PM-7AM, please contact night-coverage [email or web address]

## 2020-02-02 NOTE — Progress Notes (Addendum)
NAME:  Brenda [name], MRN:  [id number], DOB:  01-26-1961, LOS: 3 ADMISSION DATE:  01/30/2020, CONSULTATION DATE: 01/31/20 REFERRING MD:  Dr. Caleb Popp, CHIEF COMPLAINT:  SOB   Brief History   59 y/o F admitted with increased SOB, cough with yellow sputum production. Found to have acute hypoxic and hypercapnic respiratory failure.    History of present illness   59 y/o F who presented to [facility] on 8/31 with reports of 2-3 days of worsening shortness of breath and cough with yellow sputum production.    The patient attempted home nebulization for symptoms but it did not relieve dyspnea.  EMS evaluation found her to be hypoxic with saturations in the 80's while on BiPAP. She was treated with duoneb, magnesium and IV solumedrol in route to the ER. VBG on arrival to ER showed pH 7.14 and CO2 70.  She is not vaccinated for COVID.  COVID testing in ER negative. She was lethargic on arrival and placed on BiPAP.  After 2 hours of therapy, VBG improved to 7.35, PCO2 39.  WBC 15.2.  EKG showed sinus tachycardia. Initial CXR showed bibasilar interstitial prominence, borderline cardiomegaly.  The patient was admitted per [facility] for evaluation of acute respiratory failure.  She was noted to have elevated troponin (1,179 > 1,655 > 2,853).  Additionally, she had elevated LFT's.  Statin was stopped.  The patient was evaluated by Cardiology.    PCCM consulted for pulmonary evaluation.  Past Medical History  OSA - AHI 58 2016, desaturation to 64%, on CPAP  Severe Obstructive Lung Disease - PFT 09/2017 with FVC 1.64 (55%), FEV1 1.02 (44%), ratio 58.  Negative alpha-1 Chronic Hypoxic Respiratory Failure - 2L O2 dependent  Tobacco Abuse  Pancreatitis  Bipolar Disorder  HTN CKD  Cor Pulmonale  Diastolic Dysfunction  Hypertriglyceridemia  IDDM  Diabetic Neuropathy  Chronic Pain  Depression / Anxiety  Lumbar Disc Disease with Radiculopathy  BMI - 40.90  Significant Hospital Events   8/31 Admit  Consults:     Procedures:  9/3 MBS Pharyngeal- Thin Straw Penetration/Aspiration during swallow  Pharyngeal Material enters airway, remains ABOVE vocal cords then ejected out  Mild Aspiration Risk>> going back down for additional study?   Significant Diagnostic Tests:  ECHO 9/1 >> Takotsubo appearance on ECHO with estimated EF of 30-35%, global hypokinesis of left ventricle   Chest CT >> Mixed ground glass consolidation and tree-in bud appearance possibly reflecting acute multifocal infection vs aspiration   Micro Data:  COVID 8/31 >> negative  Sputum 8/31 >> Not acceptable for testing   Antimicrobials:  Ceftriaxone 8/31 >>  Azithromycin 8/31 >>    Interim history/subjective:  Lying in bed on 5 L [place] in no acute distress  States she feels better Coughing up thick tan to green secretions Has had MBS with some low grade aspiration noted>> repeating this afternoon Wore BiPAP last night  Objective   Blood pressure 96/69, pulse 82, temperature (!) 97.5 F (36.4 C), temperature source Oral, resp. rate 20, height 5' 1.5" (1.562 m), weight 98.7 kg, last menstrual period 03/08/2013, SpO2 100 %.    FiO2 (%):  [40 %] 40 %   Intake/Output Summary (Last 24 hours) at 02/02/2020 1124 Last data filed at 02/01/2020 2251 Gross per 24 hour  Intake 1065.63 ml  Output 1659 ml  Net -593.37 ml   Filed Weights   01/31/20 1100 02/01/20 0500 02/02/20 0629  Weight: 99.8 kg 100.9 kg 98.7 kg    Examination: General: Adult female  lying in bed  On 5 L [place] HEENT: NCAT,  MM pink/moist, PERRL,  Neuro: Alert and oriented x3 CV: s1s2 regular rate and rhythm, no murmur, rubs, or gallops,  PULM:  Bilateral chest excursion, Diminished breath sounds, slight increase in WOB with conversation,  GI: soft, bowel sounds active in all 4 quadrants, non-tender, non-distended, obese Extremities: warm/dry, no edema , no obvious deformities Skin: no rashes or lesions, warm and dry  Resolved Hospital Problem list       Assessment & Plan:   Acute on Chronic Hypoxic / Hypercapnic Respiratory Failure  -CXR review shows cephalization on portable film, ? Edema Question if exacerbation may be 2/2 aspiration event vs Influenza A P: Continue empiric Rocephin and Azithromycin  Trend WBC and fever curve Encourage use of BIPAP at rest and HS  RVP >> Positive for Influenza A, and influenza A  H3 Swallow eval concerning for low grade aspiration ( documented as low risk) Aggressive pulmonary toilet with IS and flutter valve Will start renally adjusted Tamiflu  Severe Obstructive Lung Disease with suspected Acute Exacerbation  OSA P: Continue prednisone 40mg  x 5 days ( Started 9/1) Continue brovana, pulmicort, and yupetri PRN Duonebs  Mucinex as a mucolytic BIPAP as above  Sat goals > 88% OP follow up, consider repeat PFT's ( Last done 2019)  Tobacco Abuse  -smoking cessation counseling provided 9/3 - Pt. Qualifies for lung cancer screening, will need referral at DC  NSTEMI  P: Heparin per phramacy  Follow troponin Cardiology following  Ischemic workup once moe stable from respiratory standpoint  Elevated LFT's  -Negative hepatitis panel.  11/3 with steatosis.  P: Trend LFT's   PCCM will sign off , she will need OP Follow up Pulmonary in 2-3 months.    Best practice:  Diet: heart healthy   Pain/Anxiety/Delirium protocol (if indicated): n/a  VAP protocol (if indicated): n/a  DVT prophylaxis: heparin gtt GI prophylaxis: PPI  Glucose control: SSI, per primary  Mobility: as tolerated  Code Status: Full Code  Family Communication: Patient updated 9/3 on plan of care  Disposition: Per [facility]   Labs   CBC: Recent Labs  Lab 01/30/20 0955 01/30/20 1008 01/30/20 1207 01/31/20 0639 02/01/20 0352  WBC 15.2*  --   --  17.4* 19.7*  NEUTROABS 6.9  --   --   --  16.0*  HGB 14.3 15.6* 16.0* 14.4 13.5  HCT 47.6* 46.0 47.0* 44.4 41.2  MCV 98.6  --   --  92.9 93.2  PLT 299  --   --  159 151     Basic Metabolic Panel: Recent Labs  Lab 01/30/20 0955 01/30/20 1008 01/30/20 1207 01/31/20 0639 02/01/20 0352  NA 136 137 137 136 138  K 4.9 4.7 4.7 4.0 4.2  CL 98  --   --  101 102  CO2 24  --   --  22 25  GLUCOSE 455*  --   --  133* 118*  BUN 17  --   --  29* 35*  CREATININE 1.66*  --   --  1.57* 1.49*  CALCIUM 9.1  --   --  9.3 9.1   GFR: Estimated Creatinine Clearance: 43.1 mL/min (A) (by C-G formula based on SCr of 1.49 mg/dL (H)). Recent Labs  Lab 01/30/20 0955 01/31/20 0639 02/01/20 0352  WBC 15.2* 17.4* 19.7*    Liver Function Tests: Recent Labs  Lab 01/30/20 0955 01/31/20 0639 02/01/20 0352  AST 193* 1,344* 718*  ALT 208*  1,577* 1,471*  ALKPHOS 126 124 100  BILITOT 0.4 0.4 0.7  PROT 7.3 7.3 6.9  ALBUMIN 3.4* 3.6 3.5   No results for input(s): LIPASE, AMYLASE in the last 168 hours. No results for input(s): AMMONIA in the last 168 hours.  ABG    Component Value Date/Time   HCO3 22.3 01/30/2020 1207   TCO2 24 01/30/2020 1207   ACIDBASEDEF 3.0 (H) 01/30/2020 1207   O2SAT 63.0 01/30/2020 1207     Coagulation Profile: No results for input(s): INR, PROTIME in the last 168 hours.  Cardiac Enzymes: Recent Labs  Lab 01/30/20 1510  CKTOTAL 199    HbA1C: Hgb A1c MFr Bld  Date/Time Value Ref Range Status  01/30/2020 03:10 PM 6.2 (H) 4.8 - 5.6 % Final    Comment:    (NOTE) Pre diabetes:          5.7%-6.4%  Diabetes:              >6.4%  Glycemic control for   <7.0% adults with diabetes   07/31/2019 08:08 AM 6.4 4.6 - 6.5 % Final    Comment:    Glycemic Control Guidelines for People with Diabetes:Non Diabetic:  <6%Goal of Therapy: <7%Additional Action Suggested:  >8%     CBG: Recent Labs  Lab 02/01/20 0834 02/01/20 1204 02/01/20 1633 02/01/20 2217 02/02/20 0752  GLUCAP 121* 115* 141* 148* 108*    Critical care time: N/A  Bevelyn Ngo, MSN, AGACNP-BC [place] [facility] See Amion for personal  pager PCCM on call pager [phone] 02/02/2020, 11:24 AM

## 2020-02-03 LAB — COMPREHENSIVE METABOLIC PANEL
ALT: 561 U/L — ABNORMAL HIGH (ref 0–44)
AST: 57 U/L — ABNORMAL HIGH (ref 15–41)
Albumin: 3.2 g/dL — ABNORMAL LOW (ref 3.5–5.0)
Alkaline Phosphatase: 87 U/L (ref 38–126)
Anion gap: 10 (ref 5–15)
BUN: 24 mg/dL — ABNORMAL HIGH (ref 8–23)
CO2: 25 mmol/L (ref 22–32)
Calcium: 9.2 mg/dL (ref 8.9–10.3)
Chloride: 103 mmol/L (ref 98–111)
Creatinine, Ser: 1.1 mg/dL — ABNORMAL HIGH (ref 0.44–1.00)
GFR calc Af Amer: >60 mL/min (ref >60)
GFR calc non Af Amer: 54 mL/min — ABNORMAL LOW (ref >60)
Glucose, Bld: 190 mg/dL — ABNORMAL HIGH (ref 70–99)
Potassium: 5.2 mmol/L — ABNORMAL HIGH (ref 3.5–5.1)
Sodium: 138 mmol/L (ref 135–145)
Total Bilirubin: 0.5 mg/dL (ref 0.3–1.2)
Total Protein: 6.5 g/dL (ref 6.5–8.1)

## 2020-02-03 LAB — RESPIRATORY PANEL BY PCR

## 2020-02-03 LAB — GLUCOSE, CAPILLARY
Glucose-Capillary: 115 mg/dL — ABNORMAL HIGH (ref 70–99)
Glucose-Capillary: 143 mg/dL — ABNORMAL HIGH (ref 70–99)
Glucose-Capillary: 223 mg/dL — ABNORMAL HIGH (ref 70–99)
Glucose-Capillary: 190 mg/dL — ABNORMAL HIGH (ref 70–99)

## 2020-02-03 LAB — MISC LABCORP TEST (SEND OUT): Labcorp test code: 139650

## 2020-02-03 LAB — CBC WITH DIFFERENTIAL/PLATELET
Abs Immature Granulocytes: 0.08 10*3/uL — ABNORMAL HIGH (ref 0.00–0.07)
Basophils Absolute: 0 10*3/uL (ref 0.0–0.1)
Basophils Relative: 0 %
Eosinophils Absolute: 0 10*3/uL (ref 0.0–0.5)
Eosinophils Relative: 0 %
HCT: 39.9 % (ref 36.0–46.0)
Hemoglobin: 12.9 g/dL (ref 12.0–15.0)
Immature Granulocytes: 1 %
Lymphocytes Relative: 13 %
Lymphs Abs: 1.4 10*3/uL (ref 0.7–4.0)
MCH: 29.7 pg (ref 26.0–34.0)
MCHC: 32.3 g/dL (ref 30.0–36.0)
MCV: 91.7 fL (ref 80.0–100.0)
Monocytes Absolute: 0.4 10*3/uL (ref 0.1–1.0)
Monocytes Relative: 4 %
Neutro Abs: 9 10*3/uL — ABNORMAL HIGH (ref 1.7–7.7)
Neutrophils Relative %: 82 %
Platelets: 183 10*3/uL (ref 150–400)
RBC: 4.35 MIL/uL (ref 3.87–5.11)
RDW: 13.4 % (ref 11.5–15.5)
WBC: 10.9 10*3/uL — ABNORMAL HIGH (ref 4.0–10.5)
nRBC: 0 % (ref 0.0–0.2)

## 2020-02-03 NOTE — Progress Notes (Signed)
Troponin downtrending. Telemetry shows 4 beats of NSVT, otherwise unremarkable. BP in 90s/60s mmHg, HR normal.   Continue ASA 81 mg daily, metoprolol 12.5 mg daily.  Add statin when LFTs improve. Consider adding losartan in 1-2 days if BP will tolerate and if renal function continues to improve.  Cardiology will see as needed.  Parke Poisson, MD 02/03/20 9:43 AM

## 2020-02-03 NOTE — Progress Notes (Signed)
PROGRESS NOTE    Brenda [name]  ZOX:[id number] DOB: 1960/07/11 DOA: 01/30/2020 PCP: [name], MD   Brief Narrative: Brenda [name] is a 59 y.o. female with medical history significant of chronic hypoxic respiratory failure on 2 to 3 L baseline O2 plus CPAP at bedtime, COPD Gold stage III (FEV1 44%, improved with bronchodilator to 50%, 2019), chronic cigarette smoker 5-10 cigarettes a day hypertension, OSA on CPAP at bedtime, IIDM, diabetic neuropathy, chronic pain, depression/anxiety, morbid obesity. Patient presented secondary severe dyspnea with concern for COPD exacerbation. Also found to have evidence of possible NSTEMI in addition to labs concerning for acute hepatitis.   Assessment & Plan:   Active Problems:   COPD exacerbation (HCC)   Acute respiratory failure with hypoxia (HCC)   COPD (chronic obstructive pulmonary disease) (HCC)   COPD exacerbation RSV pneumonia Chest x-ray suggests pneumonitis. Patient started empirically on Prednisone, Ceftriaxone and Azithromycin. Patient given BiPAP on admission which continues nightly. Triggers include allergens but patient reports no other history of viral-like illness. CT chest significant for ground-glass opacities bilaterally. Afebrile. RSV positive. -Continue Duonebs, Prednisone -Continue Flonase -Discontinue Tamiflu since RSV positive  Acute on chronic respiratory failure with hypoxia Patient is on 2 L via [place] chronically at home. In setting of above. -Wean to baseline as able. Goals SpO2 >88% -BiPAP as needed to respiratory distress  Elevated troponin Possible stressed induced cardiomyopathy per cardiology. Last troponin of 3204. -Cardiology recommendations: likely medical treatment with Aspirin, statin (once LFTs improved)  Leukocytosis Likely secondary to infection. Differential on admission significant for lymphocytosis and monocytosis initially which have now resolved. Patient now has developed neutrophilia which  may be related to infection vs steroids. CMV negative. EBV IgG positive but IgM is negative; unlikely acute infection. RSV positive. Improved.  AKI Baseline creatinine of 1.1. Creatinine of 1.66 on admission and is improved.  Elevated AST/ALT Ultrasound significant for hepatic steatosis. No associated hyperbilirubinemia. Trending upwards. Hepatitis panel obtained and is negative. Patient is on Lipitor as an outpatient which could also be the etiology behind rise; no associated myalgias. No hypotension noted. Improving.  OSA -Continue CPAP qhs  Anxiety -continue Ativan prn  Diabetes mellitus, type 2 Patient is on metformin as an outpatient. Last hemoglobin A1C of 6.2% -Continue SSI  Diabetic neuropathy -Continue gabapentin; dose for renal function   DVT prophylaxis: Heparin drip Code Status:   Code Status: Full Code Family Communication: None at bedside Disposition Plan: Discharge likely home in several days pending continued workup/management of respiratory/heart and liver disease. PT with recommendations for not PT follow-up and intermittent supervision.   Consultants:   Cardiology  Procedures:   TRANSTHORACIC ECHOCARDIOGRAM (01/31/2020) IMPRESSIONS    1. Technically difficult study, even with Definity contrast. There is no  left ventricular thrombus. There is relatively preserved systolic function  in the basal segments. Findings suggest stress cardiomyopathy (takotsubo  sd.), but could well represent  multivessel CAD. Left ventricular ejection fraction, by estimation, is 30  to 35%. The left ventricle has moderate to severely decreased function.  The left ventricle demonstrates global hypokinesis. The left ventricular  internal cavity size was mildly  dilated. Left ventricular diastolic parameters are consistent with Grade  II diastolic dysfunction (pseudonormalization). Elevated left atrial  pressure.  2. Right ventricular systolic function was not well  visualized. The right  ventricular size is normal. Tricuspid regurgitation signal is inadequate  for assessing PA pressure.  3. The mitral valve is grossly normal. No evidence of mitral valve  regurgitation.  No evidence of mitral stenosis.  4. The aortic valve is grossly normal. Aortic valve regurgitation is not  visualized. No aortic stenosis is present.   Antimicrobials:  Azithromycin  Ceftriaxone    Subjective: Breathing much better but is not functionally at baseline. Walked from bed to the door twice yesterday.  Objective: Vitals:   02/03/20 0237 02/03/20 0256 02/03/20 0752 02/03/20 0831  BP:   95/61   Pulse: 71  73   Resp: (!) 22 17 20    Temp:   (!) 97.4 F (36.3 C)   TempSrc:   Oral   SpO2: 100%  97% 98%  Weight:      Height:        Intake/Output Summary (Last 24 hours) at 02/03/2020 0905 Last data filed at 02/02/2020 2122 Gross per 24 hour  Intake 564.94 ml  Output 500 ml  Net 64.94 ml   Filed Weights   01/31/20 1100 02/01/20 0500 02/02/20 0629  Weight: 99.8 kg 100.9 kg 98.7 kg    Examination:  General exam: Appears calm and comfortable Respiratory system: Diffuse expiratory wheezing. Respiratory effort normal. Cardiovascular system: S1 & S2 heard, RRR. No murmurs, rubs, gallops or clicks. Gastrointestinal system: Abdomen is nondistended, soft and nontender. No organomegaly or masses felt. Normal bowel sounds heard. Central nervous system: Alert and oriented. No focal neurological deficits. Musculoskeletal: No edema. No calf tenderness Skin: No cyanosis. No rashes Psychiatry: Judgement and insight appear normal. Mood & affect appropriate.     Data Reviewed: I have personally reviewed following labs and imaging studies  CBC Lab Results  Component Value Date   WBC 12.0 (H) 02/02/2020   RBC 4.29 02/02/2020   HGB 12.7 02/02/2020   HCT 40.2 02/02/2020   MCV 93.7 02/02/2020   MCH 29.6 02/02/2020   PLT 140 (L) 02/02/2020   MCHC 31.6 02/02/2020    RDW 13.5 02/02/2020   LYMPHSABS 1.6 02/02/2020   MONOABS 0.4 02/02/2020   EOSABS 0.0 02/02/2020   BASOSABS 0.0 02/02/2020     Last metabolic panel Lab Results  Component Value Date   NA 139 02/02/2020   K 5.2 (H) 02/02/2020   CL 101 02/02/2020   CO2 25 02/02/2020   BUN 25 (H) 02/02/2020   CREATININE 1.16 (H) 02/02/2020   GLUCOSE 151 (H) 02/02/2020   GFRNONAA 51 (L) 02/02/2020   GFRAA 59 (L) 02/02/2020   CALCIUM 9.2 02/02/2020   PHOS 3.4 07/26/2015   PROT 6.6 02/02/2020   ALBUMIN 3.3 (L) 02/02/2020   BILITOT 0.9 02/02/2020   ALKPHOS 86 02/02/2020   AST 166 (H) 02/02/2020   ALT 924 (H) 02/02/2020   ANIONGAP 13 02/02/2020    CBG (last 3)  Recent Labs    02/02/20 1619 02/02/20 2051 02/03/20 0745  GLUCAP 172* 133* 115*     GFR: Estimated Creatinine Clearance: 55.4 mL/min (A) (by C-G formula based on SCr of 1.16 mg/dL (H)).  Coagulation Profile: No results for input(s): INR, PROTIME in the last 168 hours.  Recent Results (from the past 240 hour(s))  SARS Coronavirus 2 by RT PCR (hospital order, [facility]) Nasopharyngeal Nasopharyngeal Swab     Status: None   Collection Time: 01/30/20  9:52 AM   Specimen: Nasopharyngeal Swab  Result Value Ref Range Status   SARS Coronavirus 2 NEGATIVE NEGATIVE Final    Comment: (NOTE) SARS-CoV-2 target nucleic acids are NOT DETECTED.  The SARS-CoV-2 RNA is generally detectable in upper and lower respiratory specimens during the acute  phase of infection. The lowest concentration of SARS-CoV-2 viral copies this assay can detect is 250 copies / mL. A negative result does not preclude SARS-CoV-2 infection and should not be used as the sole basis for treatment or other patient management decisions.  A negative result may occur with improper specimen collection / handling, submission of specimen other than nasopharyngeal swab, presence of viral mutation(s) within the areas targeted by this assay, and  inadequate number of viral copies (<250 copies / mL). A negative result must be combined with clinical observations, patient history, and epidemiological information.  Fact Sheet for Patients:   BoilerBrush.com.cy  Fact Sheet for Healthcare Providers: https://pope.com/  This test is not yet approved or  cleared by the [place] FDA and has been authorized for detection and/or diagnosis of SARS-CoV-2 by FDA under an Emergency Use Authorization (EUA).  This EUA will remain in effect (meaning this test can be used) for the duration of the COVID-19 declaration under Section 564(b)(1) of the Act, 21 U.S.C. section 360bbb-3(b)(1), unless the authorization is terminated or revoked sooner.  Performed at [facility], [address] [place], [place] [address]   Expectorated sputum assessment w rflx to resp cult     Status: None   Collection Time: 01/31/20  4:48 AM   Specimen: Expectorated Sputum  Result Value Ref Range Status   Specimen Description EXPECTORATED SPUTUM  Final   Special Requests NONE  Final   Sputum evaluation   Final    Sputum specimen not acceptable for testing.  Please recollect.   RESULT CALLED TO, READ BACK BY AND VERIFIED WITH: C GOAD 01/31/20 0557 JDW Performed at [facility], [address] [place], [place] [address]    Report Status 01/31/2020 FINAL  Final  MRSA PCR Screening     Status: None   Collection Time: 01/31/20  8:46 PM   Specimen: Nasal Mucosa; Nasopharyngeal  Result Value Ref Range Status   MRSA by PCR NEGATIVE NEGATIVE Final    Comment:        The GeneXpert MRSA Assay (FDA approved for NASAL specimens only), is one component of a comprehensive MRSA colonization surveillance program. It is not intended to diagnose MRSA infection nor to guide or monitor treatment for MRSA infections. Performed at [facility], [address] [place], [place] [address]   Respiratory Panel  by PCR     Status: Abnormal   Collection Time: 02/02/20  8:57 PM   Specimen: Nasopharyngeal Swab; Respiratory  Result Value Ref Range Status   Adenovirus NOT DETECTED NOT DETECTED Final   Coronavirus 229E NOT DETECTED NOT DETECTED Final    Comment: (NOTE) The Coronavirus on the Respiratory Panel, DOES NOT test for the novel  Coronavirus (2019 nCoV)    Coronavirus HKU1 NOT DETECTED NOT DETECTED Final   Coronavirus NL63 NOT DETECTED NOT DETECTED Final   Coronavirus OC43 NOT DETECTED NOT DETECTED Final   Metapneumovirus NOT DETECTED NOT DETECTED Final   Rhinovirus / Enterovirus NOT DETECTED NOT DETECTED Final   Influenza A NOT DETECTED NOT DETECTED Final   Influenza B NOT DETECTED NOT DETECTED Final   Parainfluenza Virus 1 NOT DETECTED NOT DETECTED Final   Parainfluenza Virus 2 NOT DETECTED NOT DETECTED Final   Parainfluenza Virus 3 NOT DETECTED NOT DETECTED Final   Parainfluenza Virus 4 NOT DETECTED NOT DETECTED Final   Respiratory Syncytial Virus DETECTED (A) NOT DETECTED Final   Bordetella pertussis NOT DETECTED NOT DETECTED Final  Chlamydophila pneumoniae NOT DETECTED NOT DETECTED Final   Mycoplasma pneumoniae NOT DETECTED NOT DETECTED Final    Comment: Performed at [facility], [address], [place], [place] [address]        Radiology Studies: DG Swallowing Func-Speech Pathology  Result Date: 02/02/2020 Objective Swallowing Evaluation: Type of Study: MBS-Modified Barium Swallow Study  Patient Details Name: MERCY LEPPLA MRN: [id number] Date of Birth: May 20, 1961 Today's Date: 02/02/2020 Time: SLP Start Time (ACUTE ONLY): 0949 -SLP Stop Time (ACUTE ONLY): 1003 SLP Time Calculation (min) (ACUTE ONLY): 14 min Past Medical History: Past Medical History: Diagnosis Date . Acute pancreatitis  . Asthma   main dyspnea thought due to deconditioning (10/2013) . Bipolar I disorder, most recent episode (or current) manic, unspecified   pt denies this . Bronchitis, chronic obstructive (HCC)  2008 & 2009  FeV1 64% TLC 105% DLCO 55% 2008  -  FeV1 81% FeF 25-75 43% 2009 . COPD (chronic obstructive pulmonary disease) (HCC)   emphysema, not an issue per pulm (10/2013) . History of pyelonephritis 05/2012 . HTN (hypertension)  . Hx of migraines  . Knee pain, right  . Leukocytosis, unspecified  . Lumbar disc disease with radiculopathy   multilevel spondylitic changes, scoliosis, and anterolisthesis L4 not thought to currently be surg candidate (Dr. Phoebe Perch, [facility]) (Dr. Ollen Bowl, [place]) . Nicotine addiction  . Nonspecific abnormal electrocardiogram (ECG) (EKG)  . Obesity  . Pure hyperglyceridemia  . Suicide attempt (HCC) 06/2001 . Swelling of limb  . Urge and stress incontinence 07/2012  ([facility]) Past Surgical History: Past Surgical History: Procedure Laterality Date . CHOLECYSTECTOMY  07/1982 . CT MAXILLOFACIAL WO/W CM  06/14/06  Left max mucocele . CT of chest  06/14/2006  Normal except c/w active inflammation / infection.  Left renal atrophy . ERCP / sphincterotomy - stenosis  01/15/06 . lumbar MRI  11/2010  multilevel spondylitic changes with upper lumbar scoliosis and anterolisthesis of L4 on 5 with some L foraminal narrowing esp at L/3 with concavity of scoliosis, some central stenosis and biforaminal narrowing at L4/5 with spondylolisthesis . [place] - Pneumonia, acute asthma, left maxillary sinusitis  01/11 - 06/18/2006 . Retained stone    ERCP secondary to retained stone . Right knee surgery  1984,1989,1989,1991,1994  Reconstructions for ACL insuff . TUBAL LIGATION   HPI: Pt is a 59 y.o. female with medical history significant of chronic hypoxic respiratory failure on 2 to 3 L baseline O2 and CPAP at bedtime, COPD Gold stage III (FEV1 44%, improved with bronchodilator to 50%, 2019), chronic cigarette smoker 5-10 cigarettes a day hypertension, OSA on CPAP at bedtime, IIDM, diabetic neuropathy, chronic pain, depression/anxiety, morbid obesity who presented to the ED with increasing short of breath.  Rapid response on 9/1 due to respiratory distress and was placed on non rebreather mask. CT chest 9/1: Mixed ground-glass, consolidation and tree-in-bud nodularity predominantly in the right lower lobe and to a lesser extent the posterior segment right upper lobe. Findings could reflect an acute multifocal infection or inflammatory process or sequela of aspiration. Pt reported "getting choked on food often" to Dr. Katrinka Blazing.  No data recorded Assessment / Plan / Recommendation CHL IP CLINICAL IMPRESSIONS 02/02/2020 Clinical Impression Pt's oropharyngeal swallow mechanism was within functional limits with transient penetration (PAS 2) of thin liquids when consecutive swallows were used. Esophageal screening revealed backflow of material from the lower to upper thoracic esophagus and mild esophageal stasis. Backflow to the pharynx was not observed while the fluoro was on. However,  pt demonstrated subsequent coughing and the possibility of aspiration secondary to backflowed material reaching the cervical esophagus and pharynx is suspected. Considering the results of the esophageal screening combined with her reported symptoms, assessment (e.g., esophagram) of the esophageal phase of her swallow is recommended. Pt was educated regarding results and recommendations as well as strategies to reduce aspiration risk during episodes of dyspnea. She verbalized understanding. Further skilled SLP services are not clinically indicated at this time.  SLP Visit Diagnosis Dysphagia, unspecified (R13.10) Attention and concentration deficit following -- Frontal lobe and executive function deficit following -- Impact on safety and function Mild aspiration risk   CHL IP TREATMENT RECOMMENDATION 02/02/2020 Treatment Recommendations No treatment recommended at this time   Prognosis 02/02/2020 Prognosis for Safe Diet Advancement Good Barriers to Reach Goals -- Barriers/Prognosis Comment -- CHL IP DIET RECOMMENDATION 02/02/2020 SLP Diet Recommendations  Regular solids;Thin liquid Liquid Administration via Cup;Straw Medication Administration Whole meds with liquid Compensations Slow rate;Small sips/bites Postural Changes --   No flowsheet data found.  CHL IP FOLLOW UP RECOMMENDATIONS 02/02/2020 Follow up Recommendations None   CHL IP FREQUENCY AND DURATION 02/02/2020 Speech Therapy Frequency (ACUTE ONLY) min 2x/week Treatment Duration 1 week      CHL IP ORAL PHASE 02/02/2020 Oral Phase WFL Oral - Pudding Teaspoon -- Oral - Pudding Cup -- Oral - Honey Teaspoon -- Oral - Honey Cup -- Oral - Nectar Teaspoon -- Oral - Nectar Cup -- Oral - Nectar Straw -- Oral - Thin Teaspoon -- Oral - Thin Cup -- Oral - Thin Straw -- Oral - Puree -- Oral - Mech Soft -- Oral - Regular -- Oral - Multi-Consistency -- Oral - Pill -- Oral Phase - Comment --  CHL IP PHARYNGEAL PHASE 02/02/2020 Pharyngeal Phase WFL Pharyngeal- Pudding Teaspoon -- Pharyngeal -- Pharyngeal- Pudding Cup -- Pharyngeal -- Pharyngeal- Honey Teaspoon -- Pharyngeal -- Pharyngeal- Honey Cup -- Pharyngeal -- Pharyngeal- Nectar Teaspoon -- Pharyngeal -- Pharyngeal- Nectar Cup -- Pharyngeal -- Pharyngeal- Nectar Straw -- Pharyngeal -- Pharyngeal- Thin Teaspoon -- Pharyngeal -- Pharyngeal- Thin Cup -- Pharyngeal -- Pharyngeal- Thin Straw Penetration/Aspiration during swallow Pharyngeal Material enters airway, remains ABOVE vocal cords then ejected out Pharyngeal- Puree -- Pharyngeal -- Pharyngeal- Mechanical Soft -- Pharyngeal -- Pharyngeal- Regular -- Pharyngeal -- Pharyngeal- Multi-consistency -- Pharyngeal -- Pharyngeal- Pill -- Pharyngeal -- Pharyngeal Comment --  CHL IP CERVICAL ESOPHAGEAL PHASE 02/02/2020 Cervical Esophageal Phase (No Data) Pudding Teaspoon -- Pudding Cup -- Honey Teaspoon -- Honey Cup -- Nectar Teaspoon -- Nectar Cup -- Nectar Straw -- Thin Teaspoon -- Thin Cup -- Thin Straw -- Puree -- Mechanical Soft -- Regular -- Multi-consistency -- Pill -- Cervical Esophageal Comment -- Shanika I. Vear Clock, MS, CCC-SLP  Acute Rehabilitation Services Office number [phone] Pager [phone] Scheryl Marten 02/02/2020, 10:28 AM              DG ESOPHAGUS W SINGLE CM (SOL OR THIN BA)  Result Date: 02/02/2020 CLINICAL DATA:  Shortness of breath weakness. EXAM: ESOPHOGRAM/BARIUM SWALLOW TECHNIQUE: Single contrast examination was performed using  thin barium. FLUOROSCOPY TIME:  Fluoroscopy Time:  1 minutes 42 seconds Radiation Exposure Index (if provided by the fluoroscopic device): 14.3 mGy Number of Acquired Spot Images: 0 COMPARISON:  Chest CT of 01/31/2020 FINDINGS: Esophagus grossly normal. Limited assessment in semi recumbent positioning as the patient could not stand for the evaluation. Distensibility is normal. Mild deviation near the cardiac silhouette likely due to LEFT atrial enlargement based on comparison with recent CT. Motility  could not be fully assessed as the patient was unable to lie in the prone RAO position due to current dyspnea. However, on the last swallow obtained there was proximal escape of the bolus with head of bed slightly elevated in moderate tertiary peristaltic activity compatible with esophageal dysmotility. IMPRESSION: 1. Limited study without abnormality aside from signs of mild-to-moderate dysmotility. If there are continued symptoms that would suggest esophageal pathology a study as an outpatient could be performed for more complete assessment when the patient has recovered from this episode of acute illness. Electronically Signed   By: Donzetta Kohut M.D.   On: 02/02/2020 13:39        Scheduled Meds: . arformoterol  15 mcg Nebulization BID  . aspirin EC  81 mg Oral Daily  . azithromycin  500 mg Oral Daily  . budesonide (PULMICORT) nebulizer solution  0.5 mg Nebulization BID  . escitalopram  30 mg Oral Daily  . feeding supplement (GLUCERNA SHAKE)  237 mL Oral BID BM  . fluticasone  1 spray Each Nare BID  . gabapentin  600 mg Oral Daily  . gabapentin  900 mg Oral QHS  .  insulin aspart  0-5 Units Subcutaneous QHS  . insulin aspart  0-9 Units Subcutaneous TID WC  . insulin aspart  5 Units Subcutaneous TID WC  . ipratropium-albuterol  3 mL Nebulization Q6H  . metoprolol succinate  12.5 mg Oral Daily  . multivitamin with minerals  1 tablet Oral Daily  . nicotine  14 mg Transdermal Daily  . ondansetron (ZOFRAN) IV  4 mg Intravenous Once  . oseltamivir  30 mg Oral BID  . pantoprazole  40 mg Oral QAC breakfast  . predniSONE  40 mg Oral Q breakfast  . revefenacin  175 mcg Nebulization Daily   Continuous Infusions: . cefTRIAXone (ROCEPHIN)  IV 1 g (02/02/20 1359)     LOS: 4 days     Jacquelin Hawking, MD [facility] 02/03/2020, 9:05 AM  If 7PM-7AM, please contact night-coverage [email or web address]

## 2020-02-04 DIAGNOSIS — N1831 Chronic kidney disease, stage 3a: Secondary | ICD-10-CM

## 2020-02-04 DIAGNOSIS — R778 Other specified abnormalities of plasma proteins: Secondary | ICD-10-CM

## 2020-02-04 DIAGNOSIS — Z9989 Dependence on other enabling machines and devices: Secondary | ICD-10-CM

## 2020-02-04 DIAGNOSIS — E1142 Type 2 diabetes mellitus with diabetic polyneuropathy: Secondary | ICD-10-CM

## 2020-02-04 DIAGNOSIS — G4733 Obstructive sleep apnea (adult) (pediatric): Secondary | ICD-10-CM

## 2020-02-04 LAB — COMPREHENSIVE METABOLIC PANEL
ALT: 457 U/L — ABNORMAL HIGH (ref 0–44)
AST: 35 U/L (ref 15–41)
Albumin: 3.1 g/dL — ABNORMAL LOW (ref 3.5–5.0)
Alkaline Phosphatase: 76 U/L (ref 38–126)
Anion gap: 10 (ref 5–15)
BUN: 24 mg/dL — ABNORMAL HIGH (ref 8–23)
CO2: 27 mmol/L (ref 22–32)
Calcium: 9.3 mg/dL (ref 8.9–10.3)
Chloride: 102 mmol/L (ref 98–111)
Creatinine, Ser: 1.11 mg/dL — ABNORMAL HIGH (ref 0.44–1.00)
GFR calc Af Amer: >60 mL/min (ref >60)
GFR calc non Af Amer: 54 mL/min — ABNORMAL LOW (ref >60)
Glucose, Bld: 126 mg/dL — ABNORMAL HIGH (ref 70–99)
Potassium: 4.6 mmol/L (ref 3.5–5.1)
Sodium: 139 mmol/L (ref 135–145)
Total Bilirubin: 0.7 mg/dL (ref 0.3–1.2)
Total Protein: 6.5 g/dL (ref 6.5–8.1)

## 2020-02-04 LAB — CBC WITH DIFFERENTIAL/PLATELET
Abs Immature Granulocytes: 0.09 10*3/uL — ABNORMAL HIGH (ref 0.00–0.07)
Basophils Absolute: 0 10*3/uL (ref 0.0–0.1)
Basophils Relative: 0 %
Eosinophils Absolute: 0.1 10*3/uL (ref 0.0–0.5)
Eosinophils Relative: 1 %
HCT: 40.5 % (ref 36.0–46.0)
Hemoglobin: 13.1 g/dL (ref 12.0–15.0)
Immature Granulocytes: 1 %
Lymphocytes Relative: 30 %
Lymphs Abs: 5 10*3/uL — ABNORMAL HIGH (ref 0.7–4.0)
MCH: 29.8 pg (ref 26.0–34.0)
MCHC: 32.3 g/dL (ref 30.0–36.0)
MCV: 92.3 fL (ref 80.0–100.0)
Monocytes Absolute: 1.2 10*3/uL — ABNORMAL HIGH (ref 0.1–1.0)
Monocytes Relative: 7 %
Neutro Abs: 10.4 10*3/uL — ABNORMAL HIGH (ref 1.7–7.7)
Neutrophils Relative %: 61 %
Platelets: 192 10*3/uL (ref 150–400)
RBC: 4.39 MIL/uL (ref 3.87–5.11)
RDW: 13.5 % (ref 11.5–15.5)
WBC: 16.8 10*3/uL — ABNORMAL HIGH (ref 4.0–10.5)
nRBC: 0 % (ref 0.0–0.2)

## 2020-02-04 LAB — GLUCOSE, CAPILLARY
Glucose-Capillary: 124 mg/dL — ABNORMAL HIGH (ref 70–99)
Glucose-Capillary: 112 mg/dL — ABNORMAL HIGH (ref 70–99)

## 2020-02-04 MED ORDER — LOSARTAN POTASSIUM 25 MG PO TABS: 12.5000 mg | ORAL_TABLET | Freq: Every day | ORAL | 0 refills | Status: DC

## 2020-02-04 MED ORDER — GLUCERNA SHAKE PO LIQD: 237.0000 mL | Freq: Two times a day (BID) | ORAL | 0 refills | Status: DC

## 2020-02-04 MED ORDER — FUROSEMIDE 20 MG PO TABS: 20.0000 mg | ORAL_TABLET | Freq: Every day | ORAL | Status: DC

## 2020-02-04 MED ORDER — ASPIRIN 81 MG PO TBEC: 81.0000 mg | DELAYED_RELEASE_TABLET | Freq: Every day | ORAL | 0 refills | Status: AC

## 2020-02-04 MED ORDER — METOPROLOL SUCCINATE ER 25 MG PO TB24: 12.5000 mg | ORAL_TABLET | Freq: Every day | ORAL | 0 refills | Status: DC

## 2020-02-04 NOTE — Discharge Instructions (Signed)
Brenda [name],  You were in the hospital because you could not breath. You had a pneumonia from RSV which is a respiratory virus. In addition to your breathing issues, you had some heart dysfunction. This will need to be followed up at the heart doctor's office; your medications have been adjusted. Please also follow-up with your lung doctor for your COPD in addition to some possible swallow issues.      Chronic Obstructive Pulmonary Disease Exacerbation Chronic obstructive pulmonary disease (COPD) is a long-term (chronic) lung problem. In COPD, the flow of air from the lungs is limited. COPD exacerbations are times that breathing gets worse and you need more than your normal treatment. Without treatment, they can be life threatening. If they happen often, your lungs can become more damaged. If your COPD gets worse, your doctor may treat you with:  Medicines.  Oxygen.  Different ways to clear your airway, such as using a mask. Follow these instructions at home: Medicines  Take over-the-counter and prescription medicines only as told by your doctor.  If you take an antibiotic or steroid medicine, do not stop taking the medicine even if you start to feel better.  Keep up with shots (vaccinations) as told by your doctor. Be sure to get a yearly (annual) flu shot. Lifestyle  Do not smoke. If you need help quitting, ask your doctor.  Eat healthy foods.  Exercise regularly.  Get plenty of sleep.  Avoid tobacco smoke and other things that can bother your lungs.  Wash your hands often with soap and water. This will help keep you from getting an infection. If you cannot use soap and water, use hand sanitizer.  During flu season, avoid areas that are crowded with people. General instructions  Drink enough fluid to keep your pee (urine) clear or pale yellow. Do not do this if your doctor has told you not to.  Use a cool mist machine (vaporizer).  If you use oxygen or a machine that  turns medicine into a mist (nebulizer), continue to use it as told.  Follow all instructions for rehabilitation. These are steps you can take to make your body work better.  Keep all follow-up visits as told by your doctor. This is important. Contact a doctor if:  Your COPD symptoms get worse than normal. Get help right away if:  You are short of breath and it gets worse.  You have trouble talking.  You have chest pain.  You cough up blood.  You have a fever.  You keep throwing up (vomiting).  You feel weak or you pass out (faint).  You feel confused.  You are not able to sleep because of your symptoms.  You are not able to do daily activities. Summary  COPD exacerbations are times that breathing gets worse and you need more treatment than normal.  COPD exacerbations can be very serious and may cause your lungs to become more damaged.  Do not smoke. If you need help quitting, ask your doctor.  Stay up-to-date on your shots. Get a flu shot every year. This information is not intended to replace advice given to you by your health care provider. Make sure you discuss any questions you have with your health care provider. Document Revised: 04/30/2017 Document Reviewed: 06/22/2016 Elsevier Patient Education  2020 [employer]    Diabetes Mellitus and Nutrition, Adult When you have diabetes (diabetes mellitus), it is very important to have healthy eating habits because your blood sugar (glucose) levels are  greatly affected by what you eat and drink. Eating healthy foods in the appropriate amounts, at about the same times every day, can help you:  Control your blood glucose.  Lower your risk of heart disease.  Improve your blood pressure.  Reach or maintain a healthy weight. Every person with diabetes is different, and each person has different needs for a meal plan. Your health care provider may recommend that you work with a diet and nutrition specialist (dietitian)  to make a meal plan that is best for you. Your meal plan may vary depending on factors such as:  The calories you need.  The medicines you take.  Your weight.  Your blood glucose, blood pressure, and cholesterol levels.  Your activity level.  Other health conditions you have, such as heart or kidney disease. How do carbohydrates affect me? Carbohydrates, also called carbs, affect your blood glucose level more than any other type of food. Eating carbs naturally raises the amount of glucose in your blood. Carb counting is a method for keeping track of how many carbs you eat. Counting carbs is important to keep your blood glucose at a healthy level, especially if you use insulin or take certain oral diabetes medicines. It is important to know how many carbs you can safely have in each meal. This is different for every person. Your dietitian can help you calculate how many carbs you should have at each meal and for each snack. Foods that contain carbs include:  Bread, cereal, rice, pasta, and crackers.  Potatoes and corn.  Peas, beans, and lentils.  Milk and yogurt.  Fruit and juice.  Desserts, such as cakes, cookies, ice cream, and candy. How does alcohol affect me? Alcohol can cause a sudden decrease in blood glucose (hypoglycemia), especially if you use insulin or take certain oral diabetes medicines. Hypoglycemia can be a life-threatening condition. Symptoms of hypoglycemia (sleepiness, dizziness, and confusion) are similar to symptoms of having too much alcohol. If your health care provider says that alcohol is safe for you, follow these guidelines:  Limit alcohol intake to no more than 1 drink per day for nonpregnant women and 2 drinks per day for men. One drink equals 12 oz of beer, 5 oz of wine, or 1 oz of hard liquor.  Do not drink on an empty stomach.  Keep yourself hydrated with water, diet soda, or unsweetened iced tea.  Keep in mind that regular soda, juice, and other  mixers may contain a lot of sugar and must be counted as carbs. What are tips for following this plan?  Reading food labels  Start by checking the serving size on the "Nutrition Facts" label of packaged foods and drinks. The amount of calories, carbs, fats, and other nutrients listed on the label is based on one serving of the item. Many items contain more than one serving per package.  Check the total grams (g) of carbs in one serving. You can calculate the number of servings of carbs in one serving by dividing the total carbs by 15. For example, if a food has 30 g of total carbs, it would be equal to 2 servings of carbs.  Check the number of grams (g) of saturated and trans fats in one serving. Choose foods that have low or no amount of these fats.  Check the number of milligrams (mg) of salt (sodium) in one serving. Most people should limit total sodium intake to less than 2,300 mg per day.  Always check the  nutrition information of foods labeled as "low-fat" or "nonfat". These foods may be higher in added sugar or refined carbs and should be avoided.  Talk to your dietitian to identify your daily goals for nutrients listed on the label. Shopping  Avoid buying canned, premade, or processed foods. These foods tend to be high in fat, sodium, and added sugar.  Shop around the outside edge of the grocery store. This includes fresh fruits and vegetables, bulk grains, fresh meats, and fresh dairy. Cooking  Use low-heat cooking methods, such as baking, instead of high-heat cooking methods like deep frying.  Cook using healthy oils, such as olive, canola, or sunflower oil.  Avoid cooking with butter, cream, or high-fat meats. Meal planning  Eat meals and snacks regularly, preferably at the same times every day. Avoid going long periods of time without eating.  Eat foods high in fiber, such as fresh fruits, vegetables, beans, and whole grains. Talk to your dietitian about how many servings  of carbs you can eat at each meal.  Eat 4-6 ounces (oz) of lean protein each day, such as lean meat, chicken, fish, eggs, or tofu. One oz of lean protein is equal to: ? 1 oz of meat, chicken, or fish. ? 1 egg. ?  cup of tofu.  Eat some foods each day that contain healthy fats, such as avocado, nuts, seeds, and fish. Lifestyle  Check your blood glucose regularly.  Exercise regularly as told by your health care provider. This may include: ? 150 minutes of moderate-intensity or vigorous-intensity exercise each week. This could be brisk walking, biking, or water aerobics. ? Stretching and doing strength exercises, such as yoga or weightlifting, at least 2 times a week.  Take medicines as told by your health care provider.  Do not use any products that contain nicotine or tobacco, such as cigarettes and e-cigarettes. If you need help quitting, ask your health care provider.  Work with a Veterinary surgeon or diabetes educator to identify strategies to manage stress and any emotional and social challenges. Questions to ask a health care provider  Do I need to meet with a diabetes educator?  Do I need to meet with a dietitian?  What number can I call if I have questions?  When are the best times to check my blood glucose? Where to find more information:  American Diabetes Association: diabetes.org  Academy of Nutrition and Dietetics: www.eatright.[employer] of Diabetes and Digestive and Kidney Diseases (NIH): [employer].tn Summary  A healthy meal plan will help you control your blood glucose and maintain a healthy lifestyle.  Working with a diet and nutrition specialist (dietitian) can help you make a meal plan that is best for you.  Keep in mind that carbohydrates (carbs) and alcohol have immediate effects on your blood glucose levels. It is important to count carbs and to use alcohol carefully. This information is not intended to replace advice given to you by your  health care provider. Make sure you discuss any questions you have with your health care provider. Document Revised: 04/30/2017 Document Reviewed: 06/22/2016 Elsevier Patient Education  2020 [employer].

## 2020-02-04 NOTE — Discharge Summary (Signed)
Physician Discharge Summary  Brenda [name] ZOX:[id number] DOB: 11/14/1960 DOA: 01/30/2020  PCP: [name], MD  Admit date: 01/30/2020 Discharge date: 02/04/2020  Admitted From: Home Disposition: Home  Recommendations for Outpatient Follow-up:  1. Follow up with PCP in 1 week 2. Follow up with cardiology 3. Follow up with pulmonology 4. Outpatient esophagram once acute illness resolved 5. Repeat CMP in 1 week 6. Please follow up on the following pending results: None  Home Health: None Equipment/Devices: None  Discharge Condition: Stable CODE STATUS: Full code Diet recommendation: Heart healthy   Brief/Interim Summary:  Admission HPI written by Emeline General, MD   Chief Complaint: SOB  HPI: Brenda [name] is a 59 y.o. female with medical history significant of chronic hypoxic respiratory failure on 2 to 3 L baseline O2 plus CPAP at bedtime, COPD Gold stage III (FEV1 44%, improved with bronchodilator to 50%, 2019), chronic cigarette smoker 5-10 cigarettes a day hypertension, OSA on CPAP at bedtime, IIDM, diabetic neuropathy, chronic pain, depression/anxiety, morbid obesity, presented with increasing short of breath.  Has frequent COPD flareup this year, the most recent exacerbation was in mid July which was treated with p.o. doxycycline and p.o. steroid for 10 days.  For last 2 to 3 days, patient developed increasing short of breath, no wheezing, she has a chronic cough which became productive with yellowish sputum for the last 2 to 3 days as well.  Denies any fever chills.  No chest pains.  Patient also admits to smoking 5 to 10 cigarettes a day. ED Course: Patient was found tachypneic and tachycardia, very lethargic, VBG showed pH 7.14, PCO2 more than 70, patient was placed on BiPAP, 2 hours VBG improved to pH 7.35, PCO2 39.6.  Chest x-ray showed bilateral pneumonitis.  COVID-19 negative.  WBC 15.2 compared to 14.4 six months ago.  Glucose 455, creatinine 1.66 compared to  1.16 baseline.  EKG sinus tachycardia no acute ST changes.  Troponin 336>1100.   Hospital course:  COPD exacerbation RSV pneumonia Chest x-ray suggests pneumonitis. Patient started empirically on Prednisone, Ceftriaxone and Azithromycin. Patient given BiPAP on admission which continues nightly. Triggers include allergens but patient reports no other history of viral-like illness. CT chest significant for ground-glass opacities bilaterally. Afebrile. RSV positive. Patient improved with Duonebs, prednisone and oxygen support.  Acute on chronic respiratory failure with hypoxia Patient is on 2 L via [place] chronically at home. In setting of above. Patient managed on supplemental oxygen to keep oxygen saturation greater than 88% and she was weaned down to home 2-3 L of oxygen via nasal canula prior to discharge.  NSTEMI vs demand ischemia Elevated troponin Per cardiology assessment; not further differentiated. Associated likely stress-induced cardiomyopathy. Troponin peak of 3204. EKG with non-specific ST-T segment changes. Patient was managed medically secondary to acute respiratory issues with Heparin drip for about 48 hours in addition to aspirin treatment. Patient started on metoprolol and losartan. Patient to follow-up with cardiology as an outpatient.  Cardiomyopathy Acute combined systolic and diastolic heart failure No significant  Heart failure symptoms. Medication management as mentioned above. EF on Transthoracic Echocardiogram of 30-35% with associated global hypokinesis and grade 2 diastolic dysfunction.  Leukocytosis Likely secondary to infection. Differential on admission significant for lymphocytosis and monocytosis initially which have now resolved. Patient now has developed neutrophilia which may be related to infection vs steroids. CMV negative. EBV IgG positive but IgM is negative; unlikely acute infection. RSV positive. Improved.  AKI on CKD stage IIIa Baseline creatinine  of  1.1. Creatinine of 1.66 on admission and is improved to baseline.  Elevated AST/ALT Ultrasound significant for hepatic steatosis. No associated hyperbilirubinemia. Trending upwards. Hepatitis panel obtained and is negative. Patient is on Lipitor as an outpatient which could also be the etiology behind rise; no associated myalgias. No hypotension noted. Improving. Will resume statin on discharge.  OSA Continue CPAP qhs  Anxiety Continue Ativan prn  Diabetes mellitus, type 2 Patient is on metformin as an outpatient. Last hemoglobin A1C of 6.2%. Resume outpatient regimen.  Diabetic neuropathy Continue gabapentin   Discharge Diagnoses:  Active Problems:   COPD exacerbation (HCC)   Acute respiratory failure with hypoxia (HCC)   COPD (chronic obstructive pulmonary disease) [facility])    Discharge Instructions  Discharge Instructions    Call MD for:  difficulty breathing, headache or visual disturbances   Complete by: As directed    Call MD for:  severe uncontrolled pain   Complete by: As directed    Diet - low sodium heart healthy   Complete by: As directed    Increase activity slowly   Complete by: As directed      Allergies as of 02/04/2020      Reactions   Metolazone Other (See Comments)   Metabolic mood swings      Medication List    STOP taking these medications   doxycycline 100 MG tablet Commonly known as: VIBRA-TABS   oxyCODONE-acetaminophen 5-325 MG tablet Commonly known as: PERCOCET/ROXICET   predniSONE 10 MG tablet Commonly known as: DELTASONE   spironolactone 25 MG tablet Commonly known as: ALDACTONE     TAKE these medications   albuterol (2.5 MG/3ML) 0.083% nebulizer solution Commonly known as: PROVENTIL INHALE 1 VIAL VIA NEBULIZER EVERY 6 HOURS AS NEEDED FOR WHEEZING/SHORTNESS OF BREATH What changed: See the new instructions.   ProAir RespiClick 108 (90 Base) MCG/ACT Aepb Generic drug: Albuterol Sulfate INHALE 2 PUFFS INTO THE LUNGS EVERY 6  HOURS AS NEEDED FOR DYSPNEA/WHEEZE What changed: See the new instructions.   aspirin 81 MG EC tablet Take 1 tablet (81 mg total) by mouth daily. Swallow whole. Start taking on: February 05, 2020   atorvastatin 20 MG tablet Commonly known as: LIPITOR Take 20 mg by mouth at bedtime.   benzonatate 200 MG capsule Commonly known as: TESSALON Take 1 capsule (200 mg total) by mouth 3 (three) times daily as needed for cough. Swallow whole, to not bite pill   Breztri Aerosphere 160-9-4.8 MCG/ACT Aero Generic drug: Budeson-Glycopyrrol-Formoterol Inhale 2 puffs into the lungs 2 (two) times daily.   dextromethorphan-guaiFENesin 30-600 MG 12hr tablet Commonly known as: MUCINEX DM Take 1 tablet by mouth 2 (two) times daily as needed for cough.   escitalopram 20 MG tablet Commonly known as: LEXAPRO TAKE 1 AND 1/2 TABLETS DAILY BY MOUTH What changed: See the new instructions.   famotidine 20 MG tablet Commonly known as: PEPCID Take 20 mg by mouth at bedtime.   feeding supplement (GLUCERNA SHAKE) Liqd Take 237 mLs by mouth 2 (two) times daily between meals.   fluticasone 50 MCG/ACT nasal spray Commonly known as: FLONASE Place 1 spray into both nostrils 2 (two) times daily.   furosemide 20 MG tablet Commonly known as: LASIX Take 1 tablet (20 mg total) by mouth daily. What changed: how much to take   gabapentin 300 MG capsule Commonly known as: NEURONTIN TAKE 2 TABLETS IN THE MORNING AND 3 TABLETS AT NIGHT What changed:   how much to take  how to take  this  when to take this   LORazepam 0.5 MG tablet Commonly known as: ATIVAN TAKE 1/2 TO 1 TABLET BY MOUTH TWICE A DAY AS NEEDED FOR ANXIETY What changed: See the new instructions.   losartan 25 MG tablet Commonly known as: Cozaar Take 0.5 tablets (12.5 mg total) by mouth daily.   metFORMIN 500 MG tablet Commonly known as: GLUCOPHAGE TAKE 1 TABLET BY MOUTH EVERYDAY AT BEDTIME What changed: See the new instructions.     metoprolol succinate 25 MG 24 hr tablet Commonly known as: TOPROL-XL Take 0.5 tablets (12.5 mg total) by mouth daily. Start taking on: February 05, 2020   multivitamin with minerals Tabs tablet Take 1 tablet by mouth daily.   nystatin 100000 UNIT/ML suspension Commonly known as: MYCOSTATIN TAKE 1 TEASPOONFUL BY MOUTH 3 TIMES DAILY AS NEEDED FOR MOUTH SORES. What changed:   how much to take  how to take this  when to take this  reasons to take this  additional instructions   pantoprazole 40 MG tablet Commonly known as: PROTONIX TAKE 1 TABLET BY MOUTH EVERY DAY 30 TO 60 MINUTES BEFORE THE FIRST MEAL OF THE DAY What changed:   how much to take  how to take this  when to take this  additional instructions   promethazine-dextromethorphan 6.25-15 MG/5ML syrup Commonly known as: PROMETHAZINE-DM Take 5 mLs by mouth 4 (four) times daily as needed for cough.       Follow-up Information    [name], MD. Schedule an appointment as soon as possible for a visit in 1 week(s).   Specialty: Family Medicine Why: Hospital follow-up Contact information: [address] [place] [address] [phone]        [name], MD Follow up.   Specialties: Internal Medicine, Cardiology, Radiology Why: Cardiomyopathy Contact information: [address] [place] [address] [phone]        Nyoka Cowden, MD. Schedule an appointment as soon as possible for a visit in 1 week(s).   Specialty: Pulmonary Disease Why: COPD, possible aspiration Contact information: [address] [place] [address] [phone]              Allergies  Allergen Reactions  . Metolazone Other (See Comments)    Metabolic mood swings    Consultations:  Cardiology  Pulmonology   Procedures/Studies: CT CHEST WO CONTRAST  Result Date: 01/31/2020 CLINICAL DATA:  Respiratory distress, COPD exacerbation EXAM: CT CHEST WITHOUT CONTRAST  TECHNIQUE: Multidetector CT imaging of the chest was performed following the standard protocol without IV contrast. COMPARISON:  Radiograph 01/30/2020, CT 09/07/2017 FINDINGS: Cardiovascular: Normal cardiac size. No pericardial effusion. Few scattered coronary artery calcifications are present. Atherosclerotic calcifications present in the normal caliber thoracic aorta and proximal great vessels which branch normally from the aortic arch. No periaortic stranding or hemorrhage. Central pulmonary arteries are normal caliber. Luminal evaluation precluded in the absence of contrast media. No major venous abnormalities are seen. Mediastinum/Nodes: No mediastinal fluid or gas. Normal thyroid gland and thoracic inlet. No acute abnormality of the trachea or esophagus. No worrisome mediastinal or axillary adenopathy.Few prominent paratracheal nodes are present but not significantly changed from prior including a 9 mm lymph node with preserved fatty hilum (3/20). Hilar nodal evaluation is limited in the absence of intravenous contrast media. Lungs/Pleura: There is diffuse mild airways thickening. Mixed ground-glass, consolidation and tree-in-bud nodularity is present predominantly in the right lower lobe and posterior segment right upper lobe to a lesser extent.  Some mild interlobular septal and fissural thickening is noted as well with trace bilateral effusions. Central pulmonary vascular congestion is present with redistributed pulmonary vascularity. Additional bandlike areas of opacity may reflect atelectatic change or scarring. No pneumothorax. Few scattered calcified granulomata. No concerning pulmonary nodules or masses within the limitations of parenchymal disease though follow-up to resolution should be considered. Upper Abdomen: Patient appears to be post cholecystectomy. Interposition of the hepatic flexure anterior to the liver. Stable lobular thickening of the left adrenal gland. Musculoskeletal: Multilevel  degenerative changes are present in the imaged portions of the spine. Some sclerotic likely Modic type endplate changes noted throughout the mid to lower thoracic levels. No acute or concerning osseous lesions. No worrisome chest wall masses or lesions. Mild body wall edema is noted. IMPRESSION: 1. Mixed ground-glass, consolidation and tree-in-bud nodularity predominantly in the right lower lobe and to a lesser extent the posterior segment right upper lobe. Findings could reflect an acute multifocal infection or inflammatory process or sequela of aspiration. 2. Additional superimposed features of septal thickening and vascular redistribution/congestion with trace effusions may suggest some superimposed edematous change as well. 3. Mild body wall edema. 4. Aortic Atherosclerosis (ICD10-I70.0). 5. Coronary artery calcifications are present. Please note that the presence of coronary artery calcium documents the presence of coronary artery disease, the severity of this disease and any potential stenosis cannot be assessed on this non-gated CT examination. Electronically Signed   By: Kreg Shropshire M.D.   On: 01/31/2020 23:56   DG Chest Portable 1 View  Result Date: 01/30/2020 CLINICAL DATA:  Shortness of breath.  Potential COVID. EXAM: PORTABLE CHEST 1 VIEW COMPARISON:  05/30/2019. FINDINGS: Borderline cardiomegaly. No pulmonary venous congestion. Diffuse prominent bilateral interstitial prominence itis. No pleural effusion or pneumothorax. IMPRESSION: 1. Diffuse prominent bilateral interstitial prominence consistent with pneumonitis. 2.  Borderline cardiomegaly.  No pulmonary venous congestion. Electronically Signed   By: Maisie Fus  Register   On: 01/30/2020 11:09   DG Swallowing Func-Speech Pathology  Result Date: 02/02/2020 Objective Swallowing Evaluation: Type of Study: MBS-Modified Barium Swallow Study  Patient Details Name: Brenda [name] MRN: [id number] Date of Birth: 10-19-1960 Today's Date: 02/02/2020 Time: SLP  Start Time (ACUTE ONLY): 0949 -SLP Stop Time (ACUTE ONLY): 1003 SLP Time Calculation (min) (ACUTE ONLY): 14 min Past Medical History: Past Medical History: Diagnosis Date . Acute pancreatitis  . Asthma   main dyspnea thought due to deconditioning (10/2013) . Bipolar I disorder, most recent episode (or current) manic, unspecified   pt denies this . Bronchitis, chronic obstructive (HCC) 2008 & 2009  FeV1 64% TLC 105% DLCO 55% 2008  -  FeV1 81% FeF 25-75 43% 2009 . COPD (chronic obstructive pulmonary disease) (HCC)   emphysema, not an issue per pulm (10/2013) . History of pyelonephritis 05/2012 . HTN (hypertension)  . Hx of migraines  . Knee pain, right  . Leukocytosis, unspecified  . Lumbar disc disease with radiculopathy   multilevel spondylitic changes, scoliosis, and anterolisthesis L4 not thought to currently be surg candidate (Dr. Phoebe Perch, [facility]) (Dr. Ollen Bowl, [place]) . Nicotine addiction  . Nonspecific abnormal electrocardiogram (ECG) (EKG)  . Obesity  . Pure hyperglyceridemia  . Suicide attempt (HCC) 06/2001 . Swelling of limb  . Urge and stress incontinence 07/2012  ([facility]) Past Surgical History: Past Surgical History: Procedure Laterality Date . CHOLECYSTECTOMY  07/1982 . CT MAXILLOFACIAL WO/W CM  06/14/06  Left max mucocele . CT of chest  06/14/2006  Normal except c/w active inflammation / infection.  Left renal  atrophy . ERCP / sphincterotomy - stenosis  01/15/06 . lumbar MRI  11/2010  multilevel spondylitic changes with upper lumbar scoliosis and anterolisthesis of L4 on 5 with some L foraminal narrowing esp at L/3 with concavity of scoliosis, some central stenosis and biforaminal narrowing at L4/5 with spondylolisthesis . [place] - Pneumonia, acute asthma, left maxillary sinusitis  01/11 - 06/18/2006 . Retained stone    ERCP secondary to retained stone . Right knee surgery  1984,1989,1989,1991,1994  Reconstructions for ACL insuff . TUBAL LIGATION   HPI: Pt is a 59 y.o. female with medical history  significant of chronic hypoxic respiratory failure on 2 to 3 L baseline O2 and CPAP at bedtime, COPD Gold stage III (FEV1 44%, improved with bronchodilator to 50%, 2019), chronic cigarette smoker 5-10 cigarettes a day hypertension, OSA on CPAP at bedtime, IIDM, diabetic neuropathy, chronic pain, depression/anxiety, morbid obesity who presented to the ED with increasing short of breath. Rapid response on 9/1 due to respiratory distress and was placed on non rebreather mask. CT chest 9/1: Mixed ground-glass, consolidation and tree-in-bud nodularity predominantly in the right lower lobe and to a lesser extent the posterior segment right upper lobe. Findings could reflect an acute multifocal infection or inflammatory process or sequela of aspiration. Pt reported "getting choked on food often" to Dr. Katrinka Blazing.  No data recorded Assessment / Plan / Recommendation CHL IP CLINICAL IMPRESSIONS 02/02/2020 Clinical Impression Pt's oropharyngeal swallow mechanism was within functional limits with transient penetration (PAS 2) of thin liquids when consecutive swallows were used. Esophageal screening revealed backflow of material from the lower to upper thoracic esophagus and mild esophageal stasis. Backflow to the pharynx was not observed while the fluoro was on. However, pt demonstrated subsequent coughing and the possibility of aspiration secondary to backflowed material reaching the cervical esophagus and pharynx is suspected. Considering the results of the esophageal screening combined with her reported symptoms, assessment (e.g., esophagram) of the esophageal phase of her swallow is recommended. Pt was educated regarding results and recommendations as well as strategies to reduce aspiration risk during episodes of dyspnea. She verbalized understanding. Further skilled SLP services are not clinically indicated at this time.  SLP Visit Diagnosis Dysphagia, unspecified (R13.10) Attention and concentration deficit following --  Frontal lobe and executive function deficit following -- Impact on safety and function Mild aspiration risk   CHL IP TREATMENT RECOMMENDATION 02/02/2020 Treatment Recommendations No treatment recommended at this time   Prognosis 02/02/2020 Prognosis for Safe Diet Advancement Good Barriers to Reach Goals -- Barriers/Prognosis Comment -- CHL IP DIET RECOMMENDATION 02/02/2020 SLP Diet Recommendations Regular solids;Thin liquid Liquid Administration via Cup;Straw Medication Administration Whole meds with liquid Compensations Slow rate;Small sips/bites Postural Changes --   No flowsheet data found.  CHL IP FOLLOW UP RECOMMENDATIONS 02/02/2020 Follow up Recommendations None   CHL IP FREQUENCY AND DURATION 02/02/2020 Speech Therapy Frequency (ACUTE ONLY) min 2x/week Treatment Duration 1 week      CHL IP ORAL PHASE 02/02/2020 Oral Phase WFL Oral - Pudding Teaspoon -- Oral - Pudding Cup -- Oral - Honey Teaspoon -- Oral - Honey Cup -- Oral - Nectar Teaspoon -- Oral - Nectar Cup -- Oral - Nectar Straw -- Oral - Thin Teaspoon -- Oral - Thin Cup -- Oral - Thin Straw -- Oral - Puree -- Oral - Mech Soft -- Oral - Regular -- Oral - Multi-Consistency -- Oral - Pill -- Oral Phase - Comment --  CHL IP PHARYNGEAL PHASE 02/02/2020 Pharyngeal Phase WFL Pharyngeal- Pudding Teaspoon --  Pharyngeal -- Pharyngeal- Pudding Cup -- Pharyngeal -- Pharyngeal- Honey Teaspoon -- Pharyngeal -- Pharyngeal- Honey Cup -- Pharyngeal -- Pharyngeal- Nectar Teaspoon -- Pharyngeal -- Pharyngeal- Nectar Cup -- Pharyngeal -- Pharyngeal- Nectar Straw -- Pharyngeal -- Pharyngeal- Thin Teaspoon -- Pharyngeal -- Pharyngeal- Thin Cup -- Pharyngeal -- Pharyngeal- Thin Straw Penetration/Aspiration during swallow Pharyngeal Material enters airway, remains ABOVE vocal cords then ejected out Pharyngeal- Puree -- Pharyngeal -- Pharyngeal- Mechanical Soft -- Pharyngeal -- Pharyngeal- Regular -- Pharyngeal -- Pharyngeal- Multi-consistency -- Pharyngeal -- Pharyngeal- Pill -- Pharyngeal  -- Pharyngeal Comment --  CHL IP CERVICAL ESOPHAGEAL PHASE 02/02/2020 Cervical Esophageal Phase (No Data) Pudding Teaspoon -- Pudding Cup -- Honey Teaspoon -- Honey Cup -- Nectar Teaspoon -- Nectar Cup -- Nectar Straw -- Thin Teaspoon -- Thin Cup -- Thin Straw -- Puree -- Mechanical Soft -- Regular -- Multi-consistency -- Pill -- Cervical Esophageal Comment -- Shanika I. Vear Clock, MS, CCC-SLP Acute Rehabilitation Services Office number [phone] Pager [phone] Scheryl Marten 02/02/2020, 10:28 AM              ECHOCARDIOGRAM COMPLETE  Result Date: 01/31/2020    ECHOCARDIOGRAM REPORT   Patient Name:   Brenda [name] Date of Exam: 01/31/2020 Medical Rec #:  [id number]     Height:       61.0 in Accession #:    [id number]    Weight:       216.0 lb Date of Birth:  March 28, 1961     BSA:          1.952 m Patient Age:    59 years      BP:           102/66 mmHg Patient Gender: F             HR:           71 bpm. Exam Location:  Inpatient Procedure: 2D Echo Indications:    dyspnea  History:        Patient has no prior history of Echocardiogram examinations.                 COPD; Risk Factors:Hypertension and Current Smoker.  Sonographer:    Celene Skeen RDCS (AE) Referring Phys: [id number] Emeline General  Sonographer Comments: Image acquisition challenging due to patient body habitus, Image acquisition challenging due to respiratory motion and Image acquisition challenging due to COPD. IMPRESSIONS  1. Technically difficult study, even with Definity contrast. There is no left ventricular thrombus. There is relatively preserved systolic function in the basal segments. Findings suggest stress cardiomyopathy (takotsubo sd.), but could well represent multivessel CAD. Left ventricular ejection fraction, by estimation, is 30 to 35%. The left ventricle has moderate to severely decreased function. The left ventricle demonstrates global hypokinesis. The left ventricular internal cavity size was mildly dilated. Left ventricular diastolic  parameters are consistent with Grade II diastolic dysfunction (pseudonormalization). Elevated left atrial pressure.  2. Right ventricular systolic function was not well visualized. The right ventricular size is normal. Tricuspid regurgitation signal is inadequate for assessing PA pressure.  3. The mitral valve is grossly normal. No evidence of mitral valve regurgitation. No evidence of mitral stenosis.  4. The aortic valve is grossly normal. Aortic valve regurgitation is not visualized. No aortic stenosis is present. Comparison(s): Prior images reviewed side by side. The left ventricular function is significantly worse. The left ventricular wall motion abnormalities are new. Consider serial studies, with Definity. FINDINGS  Left Ventricle: Technically difficult study, even with Definity  contrast. There is no left ventricular thrombus. There is relatively preserved systolic function in the basal segments. Findings suggest stress cardiomyopathy (takotsubo sd.), but could well represent multivessel CAD. Left ventricular ejection fraction, by estimation, is 30 to 35%. The left ventricle has moderate to severely decreased function. The left ventricle demonstrates global hypokinesis. Definity contrast agent was given IV to delineate the left ventricular endocardial borders. The left ventricular internal cavity size was mildly dilated. There is no left ventricular hypertrophy. Left ventricular diastolic parameters are consistent with Grade II diastolic dysfunction (pseudonormalization). Elevated left atrial pressure. Right Ventricle: The right ventricular size is normal. No increase in right ventricular wall thickness. Right ventricular systolic function was not well visualized. Tricuspid regurgitation signal is inadequate for assessing PA pressure. Left Atrium: Left atrial size was normal in size. Right Atrium: Right atrial size was normal in size. Pericardium: There is no evidence of pericardial effusion. Mitral Valve:  The mitral valve is grossly normal. No evidence of mitral valve regurgitation. No evidence of mitral valve stenosis. Tricuspid Valve: The tricuspid valve is normal in structure. Tricuspid valve regurgitation is not demonstrated. Aortic Valve: The aortic valve is grossly normal. Aortic valve regurgitation is not visualized. No aortic stenosis is present. Pulmonic Valve: The pulmonic valve was not well visualized. Pulmonic valve regurgitation is not visualized. Aorta: The aortic root is normal in size and structure. IAS/Shunts: No atrial level shunt detected by color flow Doppler.  LEFT VENTRICLE PLAX 2D LVIDd:         5.50 cm  Diastology LVIDs:         4.20 cm  LV e' lateral:   7.51 cm/s LV PW:         0.90 cm  LV E/e' lateral: 11.5 LV IVS:        1.10 cm LVOT diam:     2.40 cm LV SV:         80 LV SV Index:   41 LVOT Area:     4.52 cm  LEFT ATRIUM           Index LA diam:      3.70 cm 1.90 cm/m LA Vol (A4C): 47.7 ml 24.44 ml/m  AORTIC VALVE LVOT Vmax:   96.60 cm/s LVOT Vmean:  70.600 cm/s LVOT VTI:    0.176 m  AORTA Ao Root diam: 2.70 cm MITRAL VALVE MV Area (PHT): 3.54 cm    SHUNTS MV Decel Time: 214 msec    Systemic VTI:  0.18 m MV E velocity: 86.50 cm/s  Systemic Diam: 2.40 cm MV A velocity: 72.00 cm/s MV E/A ratio:  1.20 Mihai Croitoru MD Electronically signed by Thurmon Fair MD Signature Date/Time: 01/31/2020/4:22:37 PM    Final    US Abdomen Limited RUQ  Result Date: 01/30/2020 CLINICAL DATA:  Elevated liver enzymes. EXAM: ULTRASOUND ABDOMEN LIMITED RIGHT UPPER QUADRANT COMPARISON:  01/11/2006 abdominal ultrasound. FINDINGS: Gallbladder: Surgically absent. Common bile duct: Diameter: 2.4 mm Liver: No focal lesion identified. Increased parenchymal echogenicity. Portal vein is patent on color Doppler imaging with normal direction of blood flow towards the liver. Other: None. IMPRESSION: Hepatic steatosis.  No focal hepatic lesions. Sequela of cholecystectomy. Electronically Signed   By: Stana Bunting M.D.   On: 01/30/2020 16:50   DG ESOPHAGUS W SINGLE CM (SOL OR THIN BA)  Result Date: 02/02/2020 CLINICAL DATA:  Shortness of breath weakness. EXAM: ESOPHOGRAM/BARIUM SWALLOW TECHNIQUE: Single contrast examination was performed using  thin barium. FLUOROSCOPY TIME:  Fluoroscopy Time:  1 minutes  42 seconds Radiation Exposure Index (if provided by the fluoroscopic device): 14.3 mGy Number of Acquired Spot Images: 0 COMPARISON:  Chest CT of 01/31/2020 FINDINGS: Esophagus grossly normal. Limited assessment in semi recumbent positioning as the patient could not stand for the evaluation. Distensibility is normal. Mild deviation near the cardiac silhouette likely due to LEFT atrial enlargement based on comparison with recent CT. Motility could not be fully assessed as the patient was unable to lie in the prone RAO position due to current dyspnea. However, on the last swallow obtained there was proximal escape of the bolus with head of bed slightly elevated in moderate tertiary peristaltic activity compatible with esophageal dysmotility. IMPRESSION: 1. Limited study without abnormality aside from signs of mild-to-moderate dysmotility. If there are continued symptoms that would suggest esophageal pathology a study as an outpatient could be performed for more complete assessment when the patient has recovered from this episode of acute illness. Electronically Signed   By: Donzetta Kohut M.D.   On: 02/02/2020 13:39      Subjective: Some dyspnea on exertion but feels close to baseline.  Discharge Exam: Vitals:   02/04/20 0748 02/04/20 1131  BP: 113/71   Pulse: 71   Resp: 18   Temp: 98.7 F (37.1 C)   SpO2: 100% 97%   Vitals:   02/04/20 0324 02/04/20 0611 02/04/20 0748 02/04/20 1131  BP:  102/88 113/71   Pulse: 65 88 71   Resp: 16 18 18    Temp:  98.7 F (37.1 C) 98.7 F (37.1 C)   TempSrc:  Oral Oral   SpO2: 100% 99% 100% 97%  Weight:  100.5 kg    Height:        General: Pt is alert,  awake, not in acute distress Cardiovascular: RRR, S1/S2 +, no rubs, no gallops Respiratory: Expiratory wheezing. No respiratory distress. Speaking in full sentences. Abdominal: Soft, NT, ND, bowel sounds + Extremities: no edema, no cyanosis    The results of significant diagnostics from this hospitalization (including imaging, microbiology, ancillary and laboratory) are listed below for reference.     Microbiology: Recent Results (from the past 240 hour(s))  SARS Coronavirus 2 by RT PCR (hospital order, performed in [facility] hospital lab) Nasopharyngeal Nasopharyngeal Swab     Status: None   Collection Time: 01/30/20  9:52 AM   Specimen: Nasopharyngeal Swab  Result Value Ref Range Status   SARS Coronavirus 2 NEGATIVE NEGATIVE Final    Comment: (NOTE) SARS-CoV-2 target nucleic acids are NOT DETECTED.  The SARS-CoV-2 RNA is generally detectable in upper and lower respiratory specimens during the acute phase of infection. The lowest concentration of SARS-CoV-2 viral copies this assay can detect is 250 copies / mL. A negative result does not preclude SARS-CoV-2 infection and should not be used as the sole basis for treatment or other patient management decisions.  A negative result may occur with improper specimen collection / handling, submission of specimen other than nasopharyngeal swab, presence of viral mutation(s) within the areas targeted by this assay, and inadequate number of viral copies (<250 copies / mL). A negative result must be combined with clinical observations, patient history, and epidemiological information.  Fact Sheet for Patients:   BoilerBrush.com.cy  Fact Sheet for Healthcare Providers: https://pope.com/  This test is not yet approved or  cleared by the [place] FDA and has been authorized for detection and/or diagnosis of SARS-CoV-2 by FDA under an Emergency Use Authorization (EUA).  This EUA will  remain in effect (meaning  this test can be used) for the duration of the COVID-19 declaration under Section 564(b)(1) of the Act, 21 U.S.C. section 360bbb-3(b)(1), unless the authorization is terminated or revoked sooner.  Performed at [facility], [address] [place], [place] [address]   Expectorated sputum assessment w rflx to resp cult     Status: None   Collection Time: 01/31/20  4:48 AM   Specimen: Expectorated Sputum  Result Value Ref Range Status   Specimen Description EXPECTORATED SPUTUM  Final   Special Requests NONE  Final   Sputum evaluation   Final    Sputum specimen not acceptable for testing.  Please recollect.   RESULT CALLED TO, READ BACK BY AND VERIFIED WITH: C GOAD 01/31/20 0557 JDW Performed at [facility], [address] [place], [place] [address]    Report Status 01/31/2020 FINAL  Final  MRSA PCR Screening     Status: None   Collection Time: 01/31/20  8:46 PM   Specimen: Nasal Mucosa; Nasopharyngeal  Result Value Ref Range Status   MRSA by PCR NEGATIVE NEGATIVE Final    Comment:        The GeneXpert MRSA Assay (FDA approved for NASAL specimens only), is one component of a comprehensive MRSA colonization surveillance program. It is not intended to diagnose MRSA infection nor to guide or monitor treatment for MRSA infections. Performed at [facility], [address] [place], [place] [address]   Respiratory Panel by PCR     Status: Abnormal   Collection Time: 02/02/20  8:57 PM   Specimen: Nasopharyngeal Swab; Respiratory  Result Value Ref Range Status   Adenovirus NOT DETECTED NOT DETECTED Final   Coronavirus 229E NOT DETECTED NOT DETECTED Final    Comment: (NOTE) The Coronavirus on the Respiratory Panel, DOES NOT test for the novel  Coronavirus (2019 nCoV)    Coronavirus HKU1 NOT DETECTED NOT DETECTED Final   Coronavirus NL63 NOT DETECTED NOT DETECTED Final   Coronavirus OC43 NOT DETECTED NOT DETECTED Final    Metapneumovirus NOT DETECTED NOT DETECTED Final   Rhinovirus / Enterovirus NOT DETECTED NOT DETECTED Final   Influenza A NOT DETECTED NOT DETECTED Final   Influenza B NOT DETECTED NOT DETECTED Final   Parainfluenza Virus 1 NOT DETECTED NOT DETECTED Final   Parainfluenza Virus 2 NOT DETECTED NOT DETECTED Final   Parainfluenza Virus 3 NOT DETECTED NOT DETECTED Final   Parainfluenza Virus 4 NOT DETECTED NOT DETECTED Final   Respiratory Syncytial Virus DETECTED (A) NOT DETECTED Final   Bordetella pertussis NOT DETECTED NOT DETECTED Final   Chlamydophila pneumoniae NOT DETECTED NOT DETECTED Final   Mycoplasma pneumoniae NOT DETECTED NOT DETECTED Final    Comment: Performed at [facility], [address] [place], [place] [address]     Labs: BNP (last 3 results) Recent Labs    01/30/20 0955  BNP 47.7   Basic Metabolic Panel: Recent Labs  Lab 01/31/20 0639 02/01/20 0352 02/02/20 1446 02/03/20 1919 02/04/20 0645  NA 136 138 139 138 139  K 4.0 4.2 5.2* 5.2* 4.6  CL 101 102 101 103 102  CO2 22 25 25 25 27   GLUCOSE 133* 118* 151* 190* 126*  BUN 29* 35* 25* 24* 24*  CREATININE 1.57* 1.49* 1.16* 1.10* 1.11*  CALCIUM 9.3 9.1 9.2 9.2 9.3   Liver Function Tests: Recent Labs  Lab 01/31/20 0639 02/01/20 0352 02/02/20 1446 02/03/20 1919 02/04/20 0645  AST 1,344* 718* 166* 57* 35  ALT 1,577* 1,471* 924* 561* 457*  ALKPHOS 124 100 86 87 76  BILITOT 0.4 0.7 0.9 0.5 0.7  PROT 7.3 6.9 6.6 6.5 6.5  ALBUMIN 3.6 3.5 3.3* 3.2* 3.1*   No results for input(s): LIPASE, AMYLASE in the last 168 hours. No results for input(s): AMMONIA in the last 168 hours. CBC: Recent Labs  Lab 01/30/20 0955 01/30/20 1008 01/31/20 0639 02/01/20 0352 02/02/20 1446 02/03/20 1919 02/04/20 0645  WBC 15.2*   < > 17.4* 19.7* 12.0* 10.9* 16.8*  NEUTROABS 6.9  --   --  16.0* 10.0* 9.0* 10.4*  HGB 14.3   < > 14.4 13.5 12.7 12.9 13.1  HCT 47.6*   < > 44.4 41.2 40.2 39.9 40.5  MCV 98.6   < > 92.9  93.2 93.7 91.7 92.3  PLT 299   < > 159 151 140* 183 192   < > = values in this interval not displayed.   Cardiac Enzymes: Recent Labs  Lab 01/30/20 1510  CKTOTAL 199   BNP: Invalid input(s): POCBNP CBG: Recent Labs  Lab 02/03/20 1156 02/03/20 1650 02/03/20 2038 02/04/20 0746 02/04/20 1133  GLUCAP 143* 223* 190* 124* 112*   D-Dimer No results for input(s): DDIMER in the last 72 hours. Hgb A1c No results for input(s): HGBA1C in the last 72 hours. Lipid Profile No results for input(s): CHOL, HDL, LDLCALC, TRIG, CHOLHDL, LDLDIRECT in the last 72 hours. Thyroid function studies No results for input(s): TSH, T4TOTAL, T3FREE, THYROIDAB in the last 72 hours.  Invalid input(s): FREET3 Anemia work up No results for input(s): VITAMINB12, FOLATE, FERRITIN, TIBC, IRON, RETICCTPCT in the last 72 hours. Urinalysis    Component Value Date/Time   COLORURINE YELLOW 02/24/2010 0953   APPEARANCEUR CLEAR 02/24/2010 0953   LABSPEC 1.012 02/24/2010 0953   PHURINE 5.5 02/24/2010 0953   GLUCOSEU NEGATIVE 02/24/2010 0953   HGBUR NEGATIVE 02/24/2010 0953   HGBUR negative 02/22/2008 1545   BILIRUBINUR negative 01/12/2017 1150   KETONESUR NEGATIVE 02/24/2010 0953   PROTEINUR +- 01/12/2017 1150   PROTEINUR NEGATIVE 02/24/2010 0953   UROBILINOGEN 0.2 01/12/2017 1150   UROBILINOGEN 0.2 02/24/2010 0953   NITRITE negative 01/12/2017 1150   NITRITE NEGATIVE 02/24/2010 0953   LEUKOCYTESUR Small (1+) (A) 01/12/2017 1150   Sepsis Labs Invalid input(s): PROCALCITONIN,  WBC,  LACTICIDVEN Microbiology Recent Results (from the past 240 hour(s))  SARS Coronavirus 2 by RT PCR (hospital order, performed in [facility] hospital lab) Nasopharyngeal Nasopharyngeal Swab     Status: None   Collection Time: 01/30/20  9:52 AM   Specimen: Nasopharyngeal Swab  Result Value Ref Range Status   SARS Coronavirus 2 NEGATIVE NEGATIVE Final    Comment: (NOTE) SARS-CoV-2 target nucleic acids are NOT  DETECTED.  The SARS-CoV-2 RNA is generally detectable in upper and lower respiratory specimens during the acute phase of infection. The lowest concentration of SARS-CoV-2 viral copies this assay can detect is 250 copies / mL. A negative result does not preclude SARS-CoV-2 infection and should not be used as the sole basis for treatment or other patient management decisions.  A negative result may occur with improper specimen collection / handling, submission of specimen other than nasopharyngeal swab, presence of viral mutation(s) within the areas targeted by this assay, and inadequate number of viral copies (<250 copies / mL). A negative result must be combined with clinical observations, patient history, and epidemiological information.  Fact Sheet for Patients:   BoilerBrush.com.cy  Fact Sheet for Healthcare Providers: https://pope.com/  This  test is not yet approved or  cleared by the [place] and has been authorized for detection and/or diagnosis of SARS-CoV-2 by FDA under an Emergency Use Authorization (EUA).  This EUA will remain in effect (meaning this test can be used) for the duration of the COVID-19 declaration under Section 564(b)(1) of the Act, 21 U.S.C. section 360bbb-3(b)(1), unless the authorization is terminated or revoked sooner.  Performed at [facility], [address] [place], [place] [address]   Expectorated sputum assessment w rflx to resp cult     Status: None   Collection Time: 01/31/20  4:48 AM   Specimen: Expectorated Sputum  Result Value Ref Range Status   Specimen Description EXPECTORATED SPUTUM  Final   Special Requests NONE  Final   Sputum evaluation   Final    Sputum specimen not acceptable for testing.  Please recollect.   RESULT CALLED TO, READ BACK BY AND VERIFIED WITH: C GOAD 01/31/20 0557 JDW Performed at [facility], [address] [place], [place] [address]    Report  Status 01/31/2020 FINAL  Final  MRSA PCR Screening     Status: None   Collection Time: 01/31/20  8:46 PM   Specimen: Nasal Mucosa; Nasopharyngeal  Result Value Ref Range Status   MRSA by PCR NEGATIVE NEGATIVE Final    Comment:        The GeneXpert MRSA Assay (FDA approved for NASAL specimens only), is one component of a comprehensive MRSA colonization surveillance program. It is not intended to diagnose MRSA infection nor to guide or monitor treatment for MRSA infections. Performed at [facility], [address] [place], [place] [address]   Respiratory Panel by PCR     Status: Abnormal   Collection Time: 02/02/20  8:57 PM   Specimen: Nasopharyngeal Swab; Respiratory  Result Value Ref Range Status   Adenovirus NOT DETECTED NOT DETECTED Final   Coronavirus 229E NOT DETECTED NOT DETECTED Final    Comment: (NOTE) The Coronavirus on the Respiratory Panel, DOES NOT test for the novel  Coronavirus (2019 nCoV)    Coronavirus HKU1 NOT DETECTED NOT DETECTED Final   Coronavirus NL63 NOT DETECTED NOT DETECTED Final   Coronavirus OC43 NOT DETECTED NOT DETECTED Final   Metapneumovirus NOT DETECTED NOT DETECTED Final   Rhinovirus / Enterovirus NOT DETECTED NOT DETECTED Final   Influenza A NOT DETECTED NOT DETECTED Final   Influenza B NOT DETECTED NOT DETECTED Final   Parainfluenza Virus 1 NOT DETECTED NOT DETECTED Final   Parainfluenza Virus 2 NOT DETECTED NOT DETECTED Final   Parainfluenza Virus 3 NOT DETECTED NOT DETECTED Final   Parainfluenza Virus 4 NOT DETECTED NOT DETECTED Final   Respiratory Syncytial Virus DETECTED (A) NOT DETECTED Final   Bordetella pertussis NOT DETECTED NOT DETECTED Final   Chlamydophila pneumoniae NOT DETECTED NOT DETECTED Final   Mycoplasma pneumoniae NOT DETECTED NOT DETECTED Final    Comment: Performed at [facility], [address], [place], [place] [address]     Time coordinating discharge: 35 minutes  SIGNED:   Jacquelin Hawking,  MD [facility] 02/04/2020, 2:07 PM

## 2020-02-05 ENCOUNTER — Telehealth: Payer: Self-pay

## 2020-02-05 NOTE — Telephone Encounter (Signed)
Transition Care Management Follow-up Telephone Call  Date of discharge and from where: 02/04/2020, [facility]  How have you been since you were released from the hospital? Patient states that she is doing better just still pretty weak.   Any questions or concerns? No  Items Reviewed:  Did the pt receive and understand the discharge instructions provided? Yes   Medications obtained and verified? Yes   Any new allergies since your discharge? No   Dietary orders reviewed? Yes  Do you have support at home? Yes   Functional Questionnaire: (I = Independent and D = Dependent) ADLs: I  Bathing/Dressing- I  Meal Prep- I  Eating- I  Maintaining continence- I  Transferring/Ambulation- I  Managing Meds- I  Follow up appointments reviewed:   PCP Hospital f/u appt confirmed? Yes  Scheduled to see Dr. Sharen Hones on 02/12/2020 @ 11 am.  Specialist Hospital f/u appt confirmed? Yes  Scheduled to see pulmonary   Are transportation arrangements needed? No   If their condition worsens, is the pt aware to call PCP or go to the Emergency Dept.? Yes  Was the patient provided with contact information for the PCP's office or ED? Yes  Was to pt encouraged to call back with questions or concerns? Yes

## 2020-02-06 ENCOUNTER — Encounter: Payer: Self-pay | Admitting: Family Medicine

## 2020-02-07 ENCOUNTER — Emergency Department (HOSPITAL_COMMUNITY): Payer: Medicare Other

## 2020-02-07 ENCOUNTER — Other Ambulatory Visit: Payer: Self-pay

## 2020-02-07 ENCOUNTER — Inpatient Hospital Stay (HOSPITAL_COMMUNITY)
Admission: EM | Admit: 2020-02-07 | Discharge: 2020-02-14 | DRG: 069 | Disposition: A | Discharge status: Home Health | Payer: Medicare Other | Attending: Internal Medicine | Admitting: Internal Medicine

## 2020-02-07 ENCOUNTER — Encounter (HOSPITAL_COMMUNITY): Payer: Self-pay

## 2020-02-07 ENCOUNTER — Telehealth: Payer: Self-pay | Admitting: Family Medicine

## 2020-02-07 DIAGNOSIS — Z9989 Dependence on other enabling machines and devices: Secondary | ICD-10-CM

## 2020-02-07 DIAGNOSIS — F419 Anxiety disorder, unspecified: Secondary | ICD-10-CM | POA: Diagnosis present

## 2020-02-07 DIAGNOSIS — R7989 Other specified abnormal findings of blood chemistry: Secondary | ICD-10-CM | POA: Diagnosis not present

## 2020-02-07 DIAGNOSIS — Z6841 Body Mass Index (BMI) 40.0 and over, adult: Secondary | ICD-10-CM

## 2020-02-07 DIAGNOSIS — E1169 Type 2 diabetes mellitus with other specified complication: Secondary | ICD-10-CM | POA: Diagnosis present

## 2020-02-07 DIAGNOSIS — J449 Chronic obstructive pulmonary disease, unspecified: Secondary | ICD-10-CM | POA: Diagnosis not present

## 2020-02-07 DIAGNOSIS — E785 Hyperlipidemia, unspecified: Secondary | ICD-10-CM | POA: Diagnosis not present

## 2020-02-07 DIAGNOSIS — Z8249 Family history of ischemic heart disease and other diseases of the circulatory system: Secondary | ICD-10-CM

## 2020-02-07 DIAGNOSIS — J441 Chronic obstructive pulmonary disease with (acute) exacerbation: Secondary | ICD-10-CM | POA: Diagnosis present

## 2020-02-07 DIAGNOSIS — I771 Stricture of artery: Secondary | ICD-10-CM

## 2020-02-07 DIAGNOSIS — E1142 Type 2 diabetes mellitus with diabetic polyneuropathy: Secondary | ICD-10-CM | POA: Diagnosis present

## 2020-02-07 DIAGNOSIS — F17210 Nicotine dependence, cigarettes, uncomplicated: Secondary | ICD-10-CM | POA: Diagnosis present

## 2020-02-07 DIAGNOSIS — J9611 Chronic respiratory failure with hypoxia: Secondary | ICD-10-CM | POA: Diagnosis present

## 2020-02-07 DIAGNOSIS — E119 Type 2 diabetes mellitus without complications: Secondary | ICD-10-CM | POA: Diagnosis present

## 2020-02-07 DIAGNOSIS — G459 Transient cerebral ischemic attack, unspecified: Secondary | ICD-10-CM | POA: Diagnosis present

## 2020-02-07 DIAGNOSIS — Z743 Need for continuous supervision: Secondary | ICD-10-CM | POA: Diagnosis not present

## 2020-02-07 DIAGNOSIS — G4733 Obstructive sleep apnea (adult) (pediatric): Secondary | ICD-10-CM | POA: Diagnosis not present

## 2020-02-07 DIAGNOSIS — I69352 Hemiplegia and hemiparesis following cerebral infarction affecting left dominant side: Secondary | ICD-10-CM | POA: Diagnosis not present

## 2020-02-07 DIAGNOSIS — I5022 Chronic systolic (congestive) heart failure: Secondary | ICD-10-CM | POA: Diagnosis present

## 2020-02-07 DIAGNOSIS — I252 Old myocardial infarction: Secondary | ICD-10-CM | POA: Diagnosis not present

## 2020-02-07 DIAGNOSIS — I5032 Chronic diastolic (congestive) heart failure: Secondary | ICD-10-CM | POA: Diagnosis present

## 2020-02-07 DIAGNOSIS — R6889 Other general symptoms and signs: Secondary | ICD-10-CM | POA: Diagnosis not present

## 2020-02-07 DIAGNOSIS — I130 Hypertensive heart and chronic kidney disease with heart failure and stage 1 through stage 4 chronic kidney disease, or unspecified chronic kidney disease: Secondary | ICD-10-CM | POA: Diagnosis not present

## 2020-02-07 DIAGNOSIS — E1122 Type 2 diabetes mellitus with diabetic chronic kidney disease: Secondary | ICD-10-CM | POA: Diagnosis not present

## 2020-02-07 DIAGNOSIS — N1831 Chronic kidney disease, stage 3a: Secondary | ICD-10-CM | POA: Diagnosis present

## 2020-02-07 DIAGNOSIS — I700 Atherosclerosis of aorta: Secondary | ICD-10-CM | POA: Diagnosis present

## 2020-02-07 DIAGNOSIS — E1165 Type 2 diabetes mellitus with hyperglycemia: Secondary | ICD-10-CM | POA: Diagnosis not present

## 2020-02-07 DIAGNOSIS — Z79899 Other long term (current) drug therapy: Secondary | ICD-10-CM

## 2020-02-07 DIAGNOSIS — Z7984 Long term (current) use of oral hypoglycemic drugs: Secondary | ICD-10-CM | POA: Diagnosis not present

## 2020-02-07 DIAGNOSIS — Z9981 Dependence on supplemental oxygen: Secondary | ICD-10-CM

## 2020-02-07 DIAGNOSIS — I6502 Occlusion and stenosis of left vertebral artery: Secondary | ICD-10-CM | POA: Diagnosis not present

## 2020-02-07 DIAGNOSIS — I429 Cardiomyopathy, unspecified: Secondary | ICD-10-CM | POA: Diagnosis present

## 2020-02-07 DIAGNOSIS — H748X3 Other specified disorders of middle ear and mastoid, bilateral: Secondary | ICD-10-CM | POA: Diagnosis not present

## 2020-02-07 DIAGNOSIS — Z83438 Family history of other disorder of lipoprotein metabolism and other lipidemia: Secondary | ICD-10-CM

## 2020-02-07 DIAGNOSIS — Z803 Family history of malignant neoplasm of breast: Secondary | ICD-10-CM | POA: Diagnosis not present

## 2020-02-07 DIAGNOSIS — I10 Essential (primary) hypertension: Secondary | ICD-10-CM | POA: Diagnosis not present

## 2020-02-07 DIAGNOSIS — G4489 Other headache syndrome: Secondary | ICD-10-CM | POA: Diagnosis not present

## 2020-02-07 DIAGNOSIS — R4781 Slurred speech: Secondary | ICD-10-CM | POA: Diagnosis not present

## 2020-02-07 DIAGNOSIS — Z20822 Contact with and (suspected) exposure to covid-19: Secondary | ICD-10-CM | POA: Diagnosis present

## 2020-02-07 DIAGNOSIS — F1210 Cannabis abuse, uncomplicated: Secondary | ICD-10-CM | POA: Diagnosis present

## 2020-02-07 DIAGNOSIS — I5042 Chronic combined systolic (congestive) and diastolic (congestive) heart failure: Secondary | ICD-10-CM | POA: Diagnosis not present

## 2020-02-07 DIAGNOSIS — Z825 Family history of asthma and other chronic lower respiratory diseases: Secondary | ICD-10-CM

## 2020-02-07 DIAGNOSIS — F17200 Nicotine dependence, unspecified, uncomplicated: Secondary | ICD-10-CM | POA: Diagnosis not present

## 2020-02-07 DIAGNOSIS — R29818 Other symptoms and signs involving the nervous system: Secondary | ICD-10-CM | POA: Diagnosis not present

## 2020-02-07 DIAGNOSIS — R531 Weakness: Secondary | ICD-10-CM | POA: Diagnosis not present

## 2020-02-07 DIAGNOSIS — H538 Other visual disturbances: Secondary | ICD-10-CM | POA: Diagnosis not present

## 2020-02-07 DIAGNOSIS — D72829 Elevated white blood cell count, unspecified: Secondary | ICD-10-CM | POA: Diagnosis not present

## 2020-02-07 LAB — PROTIME-INR
INR: 1 (ref 0.8–1.2)
Prothrombin Time: 12.8 seconds (ref 11.4–15.2)

## 2020-02-07 LAB — DIFFERENTIAL
Abs Immature Granulocytes: 0.12 10*3/uL — ABNORMAL HIGH (ref 0.00–0.07)
Basophils Absolute: 0.1 10*3/uL (ref 0.0–0.1)
Basophils Relative: 0 %
Eosinophils Absolute: 0.7 10*3/uL — ABNORMAL HIGH (ref 0.0–0.5)
Eosinophils Relative: 4 %
Immature Granulocytes: 1 %
Lymphocytes Relative: 28 %
Lymphs Abs: 5.1 10*3/uL — ABNORMAL HIGH (ref 0.7–4.0)
Monocytes Absolute: 1.2 10*3/uL — ABNORMAL HIGH (ref 0.1–1.0)
Monocytes Relative: 7 %
Neutro Abs: 10.9 10*3/uL — ABNORMAL HIGH (ref 1.7–7.7)
Neutrophils Relative %: 60 %

## 2020-02-07 LAB — CBC
HCT: 44.6 % (ref 36.0–46.0)
Hemoglobin: 14.4 g/dL (ref 12.0–15.0)
MCH: 30 pg (ref 26.0–34.0)
MCHC: 32.3 g/dL (ref 30.0–36.0)
MCV: 92.9 fL (ref 80.0–100.0)
Platelets: 277 10*3/uL (ref 150–400)
RBC: 4.8 MIL/uL (ref 3.87–5.11)
RDW: 13.8 % (ref 11.5–15.5)
WBC: 18 10*3/uL — ABNORMAL HIGH (ref 4.0–10.5)
nRBC: 0 % (ref 0.0–0.2)

## 2020-02-07 LAB — I-STAT CHEM 8, ED
BUN: 24 mg/dL — ABNORMAL HIGH (ref 8–23)
Calcium, Ion: 1.07 mmol/L — ABNORMAL LOW (ref 1.15–1.40)
Chloride: 100 mmol/L (ref 98–111)
Creatinine, Ser: 1.5 mg/dL — ABNORMAL HIGH (ref 0.44–1.00)
Glucose, Bld: 139 mg/dL — ABNORMAL HIGH (ref 70–99)
HCT: 45 % (ref 36.0–46.0)
Hemoglobin: 15.3 g/dL — ABNORMAL HIGH (ref 12.0–15.0)
Potassium: 4.2 mmol/L (ref 3.5–5.1)
Sodium: 137 mmol/L (ref 135–145)
TCO2: 26 mmol/L (ref 22–32)

## 2020-02-07 LAB — COMPREHENSIVE METABOLIC PANEL
ALT: 126 U/L — ABNORMAL HIGH (ref 0–44)
AST: 23 U/L (ref 15–41)
Albumin: 3.5 g/dL (ref 3.5–5.0)
Alkaline Phosphatase: 88 U/L (ref 38–126)
Anion gap: 12 (ref 5–15)
BUN: 21 mg/dL (ref 8–23)
CO2: 28 mmol/L (ref 22–32)
Calcium: 9.5 mg/dL (ref 8.9–10.3)
Chloride: 98 mmol/L (ref 98–111)
Creatinine, Ser: 1.38 mg/dL — ABNORMAL HIGH (ref 0.44–1.00)
GFR calc Af Amer: 48 mL/min — ABNORMAL LOW (ref >60)
GFR calc non Af Amer: 41 mL/min — ABNORMAL LOW (ref >60)
Glucose, Bld: 138 mg/dL — ABNORMAL HIGH (ref 70–99)
Potassium: 4.4 mmol/L (ref 3.5–5.1)
Sodium: 138 mmol/L (ref 135–145)
Total Bilirubin: 0.5 mg/dL (ref 0.3–1.2)
Total Protein: 7 g/dL (ref 6.5–8.1)

## 2020-02-07 LAB — ETHANOL: Alcohol, Ethyl (B): <10 mg/dL (ref <10)

## 2020-02-07 LAB — CBG MONITORING, ED: Glucose-Capillary: 157 mg/dL — ABNORMAL HIGH (ref 70–99)

## 2020-02-07 LAB — APTT: aPTT: 27 seconds (ref 24–36)

## 2020-02-07 MED ORDER — CLOPIDOGREL BISULFATE 300 MG PO TABS
300.0000 mg | ORAL_TABLET | Freq: Once | ORAL | Status: AC
  Administered 2020-02-07: 300 mg via ORAL
  Filled 2020-02-07: qty 1

## 2020-02-07 MED ORDER — LORAZEPAM 2 MG/ML IJ SOLN
1.0000 mg | INTRAMUSCULAR | Status: AC | PRN
  Administered 2020-02-08: 1 mg via INTRAVENOUS
  Filled 2020-02-07: qty 1

## 2020-02-07 MED ORDER — IOHEXOL 350 MG/ML SOLN
80.0000 mL | Freq: Once | INTRAVENOUS | Status: AC | PRN
  Administered 2020-02-07: 80 mL via INTRAVENOUS

## 2020-02-07 MED ORDER — ASPIRIN 81 MG PO CHEW
324.0000 mg | CHEWABLE_TABLET | Freq: Once | ORAL | Status: AC
  Administered 2020-02-07: 324 mg via ORAL
  Filled 2020-02-07: qty 4

## 2020-02-07 NOTE — Telephone Encounter (Signed)
Rx written and handed to [place].

## 2020-02-07 NOTE — Telephone Encounter (Signed)
Spoke with [employer] and was told to fax rx to [phone].    Faxed rx.  Spoke with pt making her aware.  She expresses her thanks.

## 2020-02-07 NOTE — Consult Note (Addendum)
Neurology Consultation Reason for Consult: Code stroke Referring Physician: Dr. Nathan Pickering  CC: My left side is not working right  History is obtained from: patient and chart review   HPI: Brenda [name] is a 59 y.o. female with a past medical history significant for hypertension, hyperlipidemia, insulin-dependent diabetes (complicated by neuropathy), COPD on 2 L oxygen at rest at baseline with frequent flares, obstructive sleep apnea on CPAP, obesity (BMI 41), and ongoing tobacco abuse (5 to 10 cigarettes a day)  She was recently hospitalized from 8/31-9/5 for COPD exacerbation (RSV positive; treated with prednisone, ceftriaxone, azithromycin, and nebulizer treatments), and her course was complicated by an NSTEMI with echocardiogram showing newly depressed EF of 30 to 35% (concern for Takotsubo cardiomyopathy).  She had an improving leukocytosis at the time of discharge.  She reports that she was doing well until 7:30 PM when she had sudden onset difficulty moving her left arm and left leg with associated nausea and vomiting as well as slurred speech and vertically stacked double vision.  On EMS arrival her blood pressures were 130s over 80s, glucose was 114, and she was activated as a code stroke for fluctuating symptoms (mostly left sided movement difficulties).    LKW: 7:30 PM on 9/8 tPA given?: No, due to symptoms fully resolved   ROS: A 14 point ROS was performed and is negative except as noted in the HPI.   Past Medical History:  Diagnosis Date  . Acute pancreatitis   . Asthma    main dyspnea thought due to deconditioning (10/2013)  . Bipolar I disorder, most recent episode (or current) manic, unspecified    pt denies this  . Bronchitis, chronic obstructive (HCC) 2008 & 2009   FeV1 64% TLC 105% DLCO 55% 2008  -  FeV1 81% FeF 25-75 43% 2009  . COPD (chronic obstructive pulmonary disease) (HCC)    emphysema, not an issue per pulm (10/2013)  . History of pyelonephritis 05/2012   . HTN (hypertension)   . Hx of migraines   . Knee pain, right   . Leukocytosis, unspecified   . Lumbar disc disease with radiculopathy    multilevel spondylitic changes, scoliosis, and anterolisthesis L4 not thought to currently be surg candidate (Dr. Hirsch, [facility]) (Dr. Harkins, [facility])  . Nicotine addiction   . Nonspecific abnormal electrocardiogram (ECG) (EKG)   . Obesity   . Pure hyperglyceridemia   . RSV (respiratory syncytial virus pneumonia) 01/2020   hospitalization  . Suicide attempt (HCC) 06/2001  . Swelling of limb   . Urge and stress incontinence 07/2012   ([facility])    Current Facility-Administered Medications:  .  LORazepam (ATIVAN) injection 1 mg, 1 mg, Intravenous, PRN, ,  L, MD  Current Outpatient Medications:  .  albuterol (PROVENTIL) (2.5 MG/3ML) 0.083% nebulizer solution, INHALE 1 VIAL VIA NEBULIZER EVERY 6 HOURS AS NEEDED FOR WHEEZING/SHORTNESS OF BREATH (Patient taking differently: Take 2.5 mg by nebulization every 6 (six) hours as needed for wheezing or shortness of breath. ), Disp: 150 mL, Rfl: 1 .  aspirin EC 81 MG EC tablet, Take 1 tablet (81 mg total) by mouth daily. Swallow whole., Disp: 30 tablet, Rfl: 0 .  atorvastatin (LIPITOR) 20 MG tablet, Take 20 mg by mouth at bedtime., Disp: , Rfl:  .  benzonatate (TESSALON) 200 MG capsule, Take 1 capsule (200 mg total) by mouth 3 (three) times daily as needed for cough. Swallow whole, to not bite pill, Disp: 45 capsule, Rfl: 2 .  Budeson-Glycopyrrol-Formoterol (BREZTRI   AEROSPHERE) 160-9-4.8 MCG/ACT AERO, Inhale 2 puffs into the lungs 2 (two) times daily., Disp: 10.7 g, Rfl: 11 .  dextromethorphan-guaiFENesin (MUCINEX DM) 30-600 MG 12hr tablet, Take 1 tablet by mouth 2 (two) times daily as needed for cough., Disp: , Rfl:  .  escitalopram (LEXAPRO) 20 MG tablet, TAKE 1 AND 1/2 TABLETS DAILY BY MOUTH (Patient taking differently: Take 30 mg by mouth daily. ), Disp: 135 tablet, Rfl: 2 .  famotidine (PEPCID)  20 MG tablet, Take 20 mg by mouth at bedtime., Disp: , Rfl:  .  feeding supplement, GLUCERNA SHAKE, (GLUCERNA SHAKE) LIQD, Take 237 mLs by mouth 2 (two) times daily between meals., Disp: , Rfl: 0 .  fluticasone (FLONASE) 50 MCG/ACT nasal spray, Place 1 spray into both nostrils 2 (two) times daily., Disp: 16 g, Rfl: 11 .  furosemide (LASIX) 20 MG tablet, Take 1 tablet (20 mg total) by mouth daily., Disp: , Rfl:  .  gabapentin (NEURONTIN) 300 MG capsule, TAKE 2 TABLETS IN THE MORNING AND 3 TABLETS AT NIGHT (Patient taking differently: Take 600-900 mg by mouth See admin instructions. TAKE 2 TABLETS IN THE MORNING AND 3 TABLETS AT NIGHT), Disp: 450 capsule, Rfl: 1 .  LORazepam (ATIVAN) 0.5 MG tablet, TAKE 1/2 TO 1 TABLET BY MOUTH TWICE A DAY AS NEEDED FOR ANXIETY (Patient taking differently: Take 0.25-0.5 mg by mouth 2 (two) times daily as needed for anxiety. ), Disp: 30 tablet, Rfl: 0 .  losartan (COZAAR) 25 MG tablet, Take 0.5 tablets (12.5 mg total) by mouth daily., Disp: 15 tablet, Rfl: 0 .  metFORMIN (GLUCOPHAGE) 500 MG tablet, TAKE 1 TABLET BY MOUTH EVERYDAY AT BEDTIME (Patient taking differently: Take 500 mg by mouth daily with breakfast. ), Disp: 90 tablet, Rfl: 3 .  metoprolol succinate (TOPROL-XL) 25 MG 24 hr tablet, Take 0.5 tablets (12.5 mg total) by mouth daily., Disp: 15 tablet, Rfl: 0 .  Multiple Vitamin (MULTIVITAMIN WITH MINERALS) TABS tablet, Take 1 tablet by mouth daily., Disp: , Rfl:  .  nystatin (MYCOSTATIN) 100000 UNIT/ML suspension, TAKE 1 TEASPOONFUL BY MOUTH 3 TIMES DAILY AS NEEDED FOR MOUTH SORES. (Patient taking differently: Take 5 mLs by mouth 3 (three) times daily as needed (mouth sores). ), Disp: 150 mL, Rfl: 1 .  pantoprazole (PROTONIX) 40 MG tablet, TAKE 1 TABLET BY MOUTH EVERY DAY 30 TO 60 MINUTES BEFORE THE FIRST MEAL OF THE DAY (Patient taking differently: Take 40 mg by mouth daily. ), Disp: 90 tablet, Rfl: 1 .  PROAIR RESPICLICK 335 (90 Base) MCG/ACT AEPB, INHALE 2 PUFFS  INTO THE LUNGS EVERY 6 HOURS AS NEEDED FOR DYSPNEA/WHEEZE (Patient taking differently: Inhale 2 puffs into the lungs every 6 (six) hours as needed (wheezing or shortness of breath). ), Disp: 2 each, Rfl: 3 .  promethazine-dextromethorphan (PROMETHAZINE-DM) 6.25-15 MG/5ML syrup, Take 5 mLs by mouth 4 (four) times daily as needed for cough., Disp: 240 mL, Rfl: 0   Family History  Problem Relation Age of Onset  . Heart disease Brother        MI in early 29's  . Hypertension Mother   . Hyperlipidemia Mother   . Heart disease Mother   . Hypertension Father   . Asthma Father   . Breast cancer Paternal Grandmother     Social History:  reports that she has been smoking cigarettes. She has smoked for the past 41.00 years. She has never used smokeless tobacco. She reports current alcohol use. She reports that she does not use drugs.  Exam: Current vital signs: BP 116/65   Pulse 76   Resp 15   LMP 03/08/2013   SpO2 98%  Vital signs in last 24 hours: Pulse Rate:  [76] 76 (09/08 2231) Resp:  [15-16] 15 (09/08 2231) BP: (116)/(65) 116/65 (09/08 2231) SpO2:  [95 %-98 %] 98 % (09/08 2231)   Physical Exam  Constitutional: Appears obese, chronically ill  Psych: Affect anxious but cooperative  Eyes: No scleral injection HENT: No OP obstruction  MSK: no joint deformities.  Cardiovascular: Normal rate and regular rhythm.  Respiratory: Tachypeanic  GI: Soft.  No distension. There is no tenderness.  Skin: WDI  Neuro: Mental Status: Patient is awake, alert, oriented to person, place, month, year, and situation. Patient is able to give a clear and coherent history. No signs of aphasia or neglect Cranial Nerves: II: Visual Fields are full. Pupils are equal, round, and reactive to light. 5 to 2 mm III,IV, VI: EOMI without ptosis or diploplia. No nystagmus. No skew deviation on cover-uncover testing V: Facial sensation is symmetric to temperature VII: Facial movement is symmetric.  VIII:  hearing is intact to voice X: Uvula elevates symmetrically XI: Shoulder shrug is symmetric. XII: tongue is midline without atrophy or fasciculations.  Motor: Tone is normal. Bulk is normal. 5/5 strength was present in all four extremities. Sensory: Sensation is symmetric to light touch in all four extremities  Deep Tendon Reflexes: Not assessed  Cerebellar: FNF and HKS are intact bilaterally (initially severely ataxic on the left arm and leg)  NIHSS initially 2 (just after CTA completed), resolved to 0   I have reviewed labs in epic and the results pertinent to this consultation are: ESR 39 on 02/01/20  A1c 6.2 on 01/30/20 Cr 1.5 (baseline 1.1) LFTs improving since prior admission (ALT previous 924, now 126; AST normalized) Leukocytosis to 18 (16.8 at discharge on 9/5)   ECHO 01/31/2020 "1. Technically difficult study, even with Definity contrast. There is no  left ventricular thrombus. There is relatively preserved systolic function  in the basal segments. Findings suggest stress cardiomyopathy (takotsubo  sd.), but could well represent multivessel CAD. Left ventricular ejection fraction, by estimation, is 30 to 35%. The left ventricle has moderate to severely decreased function. The left ventricle demonstrates global hypokinesis. The left ventricular  internal cavity size was mildly dilated. Left ventricular diastolic parameters are consistent with Grade II diastolic dysfunction (pseudonormalization). Elevated left atrial pressure.  2. Right ventricular systolic function was not well visualized. The right  ventricular size is normal. Tricuspid regurgitation signal is inadequate  for assessing PA pressure.  3. The mitral valve is grossly normal. No evidence of mitral valve  regurgitation. No evidence of mitral stenosis.  4. The aortic valve is grossly normal. Aortic valve regurgitation is not  visualized. No aortic stenosis is present.   Comparison(s): Prior images reviewed side  by side. The left ventricular  function is significantly worse. The left ventricular wall motion abnormalities are new. Consider serial studies, with Definity.  ..Marland KitchenMarland KitchenLeft Atrium: Left atrial size was normal in size.  Right Atrium: Right atrial size was normal in size... "  I have reviewed the images obtained: Dry HCT w/o acute process CTA with significant atherosclerosis, most notably of the left vertebral artery    Impression: Brenda [name] is a 59 year old chronically ill woman with significant stroke risk factors as detailed above.  Her presentation is concerning for a posterior circulation transient ischemic attack, with possible localization to left posterior inferior cerebellar artery  given her brief hemiataxia. Etiology atheroembolic given her vessel imaging vs. cardioembolic given known cardiomyopathy. She is not a candidate for tPA as her symptoms have fully resolved and remained stably resolved for several hours after the initial event.   Recommendations:  # TIA - Stroke labs: TSH, ESR, RPR, HgbA1c (6.2 recently), lipid panel (ordered)  - MRI brain, ordered w/ 1 mg Ativan IV for anxiolytic (patient routinely takes 0.25 - 0.5 mg PO at home nearly daily) - CTA completed, as above  - Frequent neuro checks - Echocardiogram (should be repeated given last study was technically challenging and showed worsening LV function; there is a possibility of LV thrombus developing in the interim week). Consider TEE if visualization remains challenging.  - Aspirin - dose 325mg PO or 300mg PR, followed by 81 mg daily -Statin previously held due to elevated liver function tests during her last hospitalization, would cautiously given her liver function continues to normalize (trend LFTs when resuming statin).  Goal LDL less than 70 - Plavix 300 mg load with 75 mg daily for 90 day course given intracranial athero (antiplatelet therapy should be stopped in favor of AC if Afib is found) - Risk factor  modification - Telemetry monitoring; 30 day event monitor on discharge if no arrythmias captured  - PT consult, OT consult, Speech consult not indicated as patient is back to baseline - Stroke team to follow  # Hypertension - Goal normotension given no stroke  # Diabetes - Goal normoglycemia   # Leukocytosis  - Workup per ED / primary team    MD-PhD [facility] [phone]  

## 2020-02-07 NOTE — Telephone Encounter (Signed)
[facility] - Client TELEPHONE ADVICE RECORD [facility]urse Patient Name: Brenda [name] Gender: Female DOB: 1960/10/23 Age: 59 Y 6 M 27 D Return Phone Number: [phone] (Primary) [place] [address] Client [facility] - Client Client Site [facility] - Day Physician [name] - MD Contact Type Call Who Is Calling Patient / Member / Family / Caregiver Call Type Triage / Clinical Relationship To Patient Self Return Phone Number [phone] (Primary) Chief Complaint BREATHING - shortness of breath or sounds breathless Reason for Call Symptomatic / Request for Health Information Initial Comment Caller states her nebulizer machine broke. She has shortness of breath Translation No Nurse Assessment Nurse: Durwin Nora, RN, Tresa Endo Date/Time Lamount Cohen Time): 02/07/2020 2:45:57 PM Confirm and document reason for call. If symptomatic, describe symptoms. ---Pt states she was hospitalized for RSV, came home Sunday. Pt is supposed to be doing nebulizer treatments but her nebulizer tubing broke. Pt states she is wheezing but she is anxious that she cannot do her treatment. Has the patient had close contact with a person known or suspected to have the novel coronavirus illness OR traveled / lives in area with major community spread (including international travel) in the last 14 days from the onset of symptoms? * If Asymptomatic, screen for exposure and travel within the last 14 days. ---No Does the patient have any new or worsening symptoms? ---Yes Will a triage be completed? ---Yes Related visit to physician within the last 2 weeks? ---Yes Does the PT have any chronic conditions? (i.e. diabetes, asthma, this includes High risk factors for pregnancy, etc.) ---Yes List chronic conditions. ---Asthma, COPD, Diabetes Is this a behavioral health or substance abuse call?  ---No Guidelines Guideline Title Affirmed Question Affirmed Notes Nurse Date/Time (Eastern Time) COPD Post- Hospitalization Follow-up Call [1] Caller has URGENT question AND [2] triager unable to answer question [place]e Clock 02/07/2020 2:49:26 PM PLEASE NOTE: All timestamps contained within this report are represented as [place] Standard Time. CONFIDENTIALTY NOTICE: This fax transmission is intended only for the addressee. It contains information that is legally privileged, confidential or otherwise protected from use or disclosure. If you are not the intended recipient, you are strictly prohibited from reviewing, disclosing, copying using or disseminating any of this information or taking any action in reliance on or regarding this information. If you have received this fax in error, please notify us immediately by telephone so that we can arrange for its return to [place]. Phone: [phone], Toll-Free: [phone], Fax: [phone] Page: 2 of 2 Call Id: [id number] Disp. Time Lamount Cohen Time) Disposition Final User 02/07/2020 2:42:50 PM Send to Urgent Lestine Mount 02/07/2020 2:53:56 PM Call PCP Now Yes Durwin Nora, RN, Adine Madura Disagree/Comply Comply Caller Understands Yes PreDisposition Did not know what to do Care Advice Given Per Guideline CALL PCP NOW: CARE ADVICE given per COPD Post-Hospitalization Follow-Up Call (Adult) guideline. Comments User: Clarita Crane, RN Date/Time Lamount Cohen Time): 02/07/2020 2:51:39 PM Pt uses O2 @LPM  at all times, and currently her pulse ox is 93-94%. User: , RN Date/Time Clarita Crane Time): 02/07/2020 3:04:35 PM RN called office and warm transferred caller to Robin regarding nebulizer issue.

## 2020-02-07 NOTE — ED Provider Notes (Signed)
[name] Provider Note   CSN: [id number] Arrival date & time: 02/07/20  2046  An emergency department physician performed an initial assessment on this suspected stroke patient at 2048.  History Chief Complaint  Patient presents with  . Code Stroke    Brenda [name] is a 59 y.o. female.  HPI Patient presents as a code stroke.  Last normal around 730 today.  Developed double vision.  States that there was 2 things on top of each other.  States that she closed either one of her eyes it would resolve.  Clear vision without acute rash.  Also developed some difficulty using her left arm.  Also difficulty speaking.  Came with a headache.  Has a history of headaches occasionally but never had weakness with it.  She is on aspirin.  She states symptoms are much improved now compared to what they were before.  Reportedly also on oxygen at baseline.    Past Medical History:  Diagnosis Date  . Acute pancreatitis   . Asthma    main dyspnea thought due to deconditioning (10/2013)  . Bipolar I disorder, most recent episode (or current) manic, unspecified    pt denies this  . Bronchitis, chronic obstructive (HCC) 2008 & 2009   FeV1 64% TLC 105% DLCO 55% 2008  -  FeV1 81% FeF 25-75 43% 2009  . COPD (chronic obstructive pulmonary disease) (HCC)    emphysema, not an issue per pulm (10/2013)  . History of pyelonephritis 05/2012  . HTN (hypertension)   . Hx of migraines   . Knee pain, right   . Leukocytosis, unspecified   . Lumbar disc disease with radiculopathy    multilevel spondylitic changes, scoliosis, and anterolisthesis L4 not thought to currently be surg candidate (Dr. Phoebe PerchHirsch, [facility]) (Dr. Ollen [facility]arkins, [facility])  . Nicotine addiction   . Nonspecific abnormal electrocardiogram (ECG) (EKG)   . Obesity   . Pure hyperglyceridemia   . RSV (respiratory syncytial virus pneumonia) 01/2020   hospitalization  . Suicide attempt (HCC) 06/2001  . Swelling of limb     . Urge and stress incontinence 07/2012   ([facility])    Patient Active Problem List   Diagnosis Date Noted  . Elevated troponin 02/04/2020  . COPD (chronic obstructive pulmonary disease) (HCC) 01/30/2020  . LFT elevation   . Abnormal appearance of cervix 08/07/2019  . CKD (chronic kidney disease) stage 3, GFR 30-59 ml/min 08/07/2019  . Knee mass, right 11/28/2018  . Advanced care planning/counseling discussion 07/27/2018  . Unilateral primary osteoarthritis, left knee 10/28/2017  . COPD  GOLD II/III still smoking/ 02 dep  10/20/2017  . Cor pulmonale (chronic) (HCC) 09/16/2017  . Diastolic dysfunction 08/26/2017  . Edema 08/04/2017  . Grief 02/24/2017  . Chronic respiratory failure with hypoxia (HCC) 07/31/2015  . Type 2 diabetes, controlled, with peripheral neuropathy (HCC)   . Acute respiratory failure with hypoxia (HCC) 07/20/2015  . COPD exacerbation (HCC) 09/14/2014  . OSA on CPAP 09/14/2014  . Health maintenance examination 09/14/2014  . Upper airway cough syndrome 09/22/2013  . Chronic pain syndrome 05/30/2013  . Migraine, unspecified, without mention of intractable migraine without mention of status migrainosus 05/30/2013  . Pyelonephritis 05/05/2012  . Asthmatic bronchitis 02/27/2008  . Leukocytosis 02/22/2008  . NONSPECIFIC ABNORMAL ELECTROCARDIOGRAM 02/22/2008  . HYPERTENSION, BENIGN ESSENTIAL 06/23/2007  . HYPERTRIGLYCERIDEMIA 06/01/2007  . Morbid obesity with BMI of 40.0-44.9, adult (HCC) 06/01/2007  . Adjustment disorder with anxiety 06/01/2007  . Cigarette smoker 06/01/2007  .  Left leg swelling 05/04/2007    Past Surgical History:  Procedure Laterality Date  . CHOLECYSTECTOMY  07/1982  . CT MAXILLOFACIAL WO/W CM  06/14/06   Left max mucocele  . CT of chest  06/14/2006   Normal except c/w active inflammation / infection.  Left renal atrophy  . ERCP / sphincterotomy - stenosis  01/15/06  . lumbar MRI  11/2010   multilevel spondylitic changes with upper  lumbar scoliosis and anterolisthesis of L4 on 5 with some L foraminal narrowing esp at L/3 with concavity of scoliosis, some central stenosis and biforaminal narrowing at L4/5 with spondylolisthesis  . [place] - Pneumonia, acute asthma, left maxillary sinusitis  01/11 - 06/18/2006  . Retained stone     ERCP secondary to retained stone  . Right knee surgery  1984,1989,1989,1991,1994   Reconstructions for ACL insuff  . TUBAL LIGATION       OB History    Gravida  4   Para  4   Term  4   Preterm      AB      Living  4     SAB      TAB      Ectopic      Multiple      Live Births  4           Family History  Problem Relation Age of Onset  . Heart disease Brother        MI in early 17's  . Hypertension Mother   . Hyperlipidemia Mother   . Heart disease Mother   . Hypertension Father   . Asthma Father   . Breast cancer Paternal Grandmother     Social History   Tobacco Use  . Smoking status: Current Some Day Smoker    Years: 41.00    Types: Cigarettes  . Smokeless tobacco: Never Used  . Tobacco comment: 3 cigs per day 05/29/19 lmr  Vaping Use  . Vaping Use: Never used  Substance Use Topics  . Alcohol use: Yes    Alcohol/week: 0.0 standard drinks    Comment: occasional  . Drug use: No    Home Medications Prior to Admission medications   Medication Sig Start Date End Date Taking? Authorizing Provider  albuterol (PROVENTIL) (2.5 MG/3ML) 0.083% nebulizer solution INHALE 1 VIAL VIA NEBULIZER EVERY 6 HOURS AS NEEDED FOR WHEEZING/SHORTNESS OF BREATH Patient taking differently: Take 2.5 mg by nebulization every 6 (six) hours as needed for wheezing or shortness of breath.  05/11/18   [name], MD  aspirin EC 81 MG EC tablet Take 1 tablet (81 mg total) by mouth daily. Swallow whole. 02/05/20   Narda Bonds, MD  atorvastatin (LIPITOR) 20 MG tablet Take 20 mg by mouth at bedtime. 01/01/20   [provider]  benzonatate (TESSALON) 200 MG capsule  Take 1 capsule (200 mg total) by mouth 3 (three) times daily as needed for cough. Swallow whole, to not bite pill 04/22/18   Parrett, Virgel Bouquet, NP  Budeson-Glycopyrrol-Formoterol (BREZTRI AEROSPHERE) 160-9-4.8 MCG/ACT AERO Inhale 2 puffs into the lungs 2 (two) times daily. 05/30/19   Nyoka Cowden, MD  dextromethorphan-guaiFENesin (MUCINEX DM) 30-600 MG 12hr tablet Take 1 tablet by mouth 2 (two) times daily as needed for cough.    [provider]  escitalopram (LEXAPRO) 20 MG tablet TAKE 1 AND 1/2 TABLETS DAILY BY MOUTH Patient taking differently: Take 30 mg by mouth daily.  01/04/20   [name], MD  famotidine (  PEPCID) 20 MG tablet Take 20 mg by mouth at bedtime. 12/15/19   [provider]  feeding supplement, GLUCERNA SHAKE, (GLUCERNA SHAKE) LIQD Take 237 mLs by mouth 2 (two) times daily between meals. 02/04/20   Narda Bonds, MD  fluticasone (FLONASE) 50 MCG/ACT nasal spray Place 1 spray into both nostrils 2 (two) times daily. 05/30/19   Nyoka Cowden, MD  furosemide (LASIX) 20 MG tablet Take 1 tablet (20 mg total) by mouth daily. 02/04/20   Narda Bonds, MD  gabapentin (NEURONTIN) 300 MG capsule TAKE 2 TABLETS IN THE MORNING AND 3 TABLETS AT NIGHT Patient taking differently: Take 600-900 mg by mouth See admin instructions. TAKE 2 TABLETS IN THE MORNING AND 3 TABLETS AT NIGHT 09/24/19   [name], MD  LORazepam (ATIVAN) 0.5 MG tablet TAKE 1/2 TO 1 TABLET BY MOUTH TWICE A DAY AS NEEDED FOR ANXIETY Patient taking differently: Take 0.25-0.5 mg by mouth 2 (two) times daily as needed for anxiety.  01/17/20   [name], MD  losartan (COZAAR) 25 MG tablet Take 0.5 tablets (12.5 mg total) by mouth daily. 02/04/20 03/05/20  Narda Bonds, MD  metFORMIN (GLUCOPHAGE) 500 MG tablet TAKE 1 TABLET BY MOUTH EVERYDAY AT BEDTIME Patient taking differently: Take 500 mg by mouth daily with breakfast.  10/02/19   [name], MD  metoprolol succinate (TOPROL-XL) 25 MG 24  hr tablet Take 0.5 tablets (12.5 mg total) by mouth daily. 02/05/20 03/06/20  Narda Bonds, MD  Multiple Vitamin (MULTIVITAMIN WITH MINERALS) TABS tablet Take 1 tablet by mouth daily.    [provider]  nystatin (MYCOSTATIN) 100000 UNIT/ML suspension TAKE 1 TEASPOONFUL BY MOUTH 3 TIMES DAILY AS NEEDED FOR MOUTH SORES. Patient taking differently: Take 5 mLs by mouth 3 (three) times daily as needed (mouth sores).  04/22/18   Parrett, Virgel Bouquet, NP  pantoprazole (PROTONIX) 40 MG tablet TAKE 1 TABLET BY MOUTH EVERY DAY 30 TO 60 MINUTES BEFORE THE FIRST MEAL OF THE DAY Patient taking differently: Take 40 mg by mouth daily.  06/10/18   Nyoka Cowden, MD  PROAIR RESPICLICK 108 (90 Base) MCG/ACT AEPB INHALE 2 PUFFS INTO THE LUNGS EVERY 6 HOURS AS NEEDED FOR DYSPNEA/WHEEZE Patient taking differently: Inhale 2 puffs into the lungs every 6 (six) hours as needed (wheezing or shortness of breath).  08/09/19   [name], MD  promethazine-dextromethorphan (PROMETHAZINE-DM) 6.25-15 MG/5ML syrup Take 5 mLs by mouth 4 (four) times daily as needed for cough. 09/15/19   Nyoka Cowden, MD    Allergies    Metolazone  Review of Systems   Review of Systems  Constitutional: Negative for appetite change.  Eyes: Positive for visual disturbance.  Respiratory: Negative for shortness of breath.   Cardiovascular: Negative for chest pain.  Gastrointestinal: Negative for abdominal pain.  Genitourinary: Negative for flank pain.  Musculoskeletal: Negative for back pain.  Skin: Negative for rash.  Neurological: Positive for speech difficulty and weakness.    Physical Exam Updated Vital Signs BP 116/65   Pulse 67   Temp 98.5 F (36.9 C) (Oral)   Resp 15   LMP 03/08/2013   SpO2 98%   Physical Exam Vitals and nursing note reviewed.  HENT:     Head: Normocephalic.  Eyes:     Extraocular Movements: Extraocular movements intact.     Pupils: Pupils are equal, round, and reactive to light.    Cardiovascular:     Rate and Rhythm: Regular rhythm.  Pulmonary:  Breath sounds: No wheezing or rhonchi.  Abdominal:     Tenderness: There is no abdominal tenderness.     Hernia: No hernia is present.  Musculoskeletal:        General: No tenderness.     Cervical back: Neck supple.  Skin:    General: Skin is warm.     Capillary Refill: Capillary refill takes less than 2 seconds.  Neurological:     Mental Status: She is alert and oriented to person, place, and time.     Comments: Face symmetric.  Eye movements intact.  Conjugate gaze.  Good grip strength bilaterally.  Finger-nose intact bilaterally.  Normal speech.     ED Results / Procedures / Treatments   Labs (all labs ordered are listed, but only abnormal results are displayed) Labs Reviewed  CBC - Abnormal; Notable for the following components:      Result Value   WBC 18.0 (*)    All other components within normal limits  DIFFERENTIAL - Abnormal; Notable for the following components:   Neutro Abs 10.9 (*)    Lymphs Abs 5.1 (*)    Monocytes Absolute 1.2 (*)    Eosinophils Absolute 0.7 (*)    Abs Immature Granulocytes 0.12 (*)    All other components within normal limits  COMPREHENSIVE METABOLIC PANEL - Abnormal; Notable for the following components:   Glucose, Bld 138 (*)    Creatinine, Ser 1.38 (*)    ALT 126 (*)    GFR calc non Af Amer 41 (*)    GFR calc Af Amer 48 (*)    All other components within normal limits  I-STAT CHEM 8, ED - Abnormal; Notable for the following components:   BUN 24 (*)    Creatinine, Ser 1.50 (*)    Glucose, Bld 139 (*)    Calcium, Ion 1.07 (*)    Hemoglobin 15.3 (*)    All other components within normal limits  CBG MONITORING, ED - Abnormal; Notable for the following components:   Glucose-Capillary 157 (*)    All other components within normal limits  SARS CORONAVIRUS 2 BY RT PCR (HOSPITAL ORDER, PERFORMED IN [facility])  ETHANOL  PROTIME-INR  APTT  RAPID URINE  DRUG SCREEN, HOSP PERFORMED  URINALYSIS, ROUTINE W REFLEX MICROSCOPIC  SEDIMENTATION RATE  LIPID PANEL  TSH  RPR  HIV ANTIBODY (ROUTINE TESTING W REFLEX)  RAPID HIV SCREEN (HIV 1/2 AB+AG)    EKG None  Radiology CT Angio Head W or Wo Contrast  Result Date: 02/07/2020 CLINICAL DATA:  Left-sided weakness, code stroke follow-up EXAM: CT ANGIOGRAPHY HEAD AND NECK TECHNIQUE: Multidetector CT imaging of the head and neck was performed using the standard protocol during bolus administration of intravenous contrast. Multiplanar CT image reconstructions and MIPs were obtained to evaluate the vascular anatomy. Carotid stenosis measurements (when applicable) are obtained utilizing NASCET criteria, using the distal internal carotid diameter as the denominator. CONTRAST:  80mL OMNIPAQUE IOHEXOL 350 MG/ML SOLN COMPARISON:  None. FINDINGS: CTA NECK Aortic arch: Great vessel origins are patent with mild calcified plaque. Right carotid system: Patent. Patent mixed but primarily noncalcified plaque at the ICA origin causing less than 50% stenosis. Left carotid system: Patent. Mild calcified plaque at the ICA origin causing less than 50% stenosis. Vertebral arteries: Patent. Right vertebral artery is dominant. Left vertebral artery origin is not well seen due to artifact. Skeleton: Multilevel degenerative changes of the cervical spine. Other neck: No mass or adenopathy. Upper chest: No apical lung  mass. Review of the MIP images confirms the above findings CTA HEAD Anterior circulation: Intracranial internal carotid arteries are patent. Posterior circulation: Intracranial right vertebral artery is patent. Patent right PICA origin. Intracranial left vertebral artery becomes diminutive with high-grade stenosis or occlusion distally. Basilar artery is patent. Small left AICA appears to be present. There is high-grade stenosis near the superior cerebellar origins. The superior cerebellar artery origins are poorly opacified  with reconstitution. Bilateral posterior communicating arteries are present. There is fetal origin of the right PCA. Venous sinuses: Patent as allowed by contrast bolus timing. Review of the MIP images confirms the above findings IMPRESSION: No large vessel occlusion. Plaque at the ICA origins causes less than 50% stenosis. Small caliber nondominant left vertebral artery becomes diminutive intracranially with high-grade stenosis or occlusion distally. A left PICA origin is not identified and may be congenitally absent. A small left AICA appears to be present. Focal high-grade stenosis of the basilar artery at the superior cerebellar artery origins. These results were called by telephone at the time of interpretation on 02/07/2020 at 9:39 pm to provider Dr. Iver Nestle, who verbally acknowledged these results. Electronically Signed   By: Guadlupe Spanish M.D.   On: 02/07/2020 21:41   CT Angio Neck W and/or Wo Contrast  Result Date: 02/07/2020 CLINICAL DATA:  Left-sided weakness, code stroke follow-up EXAM: CT ANGIOGRAPHY HEAD AND NECK TECHNIQUE: Multidetector CT imaging of the head and neck was performed using the standard protocol during bolus administration of intravenous contrast. Multiplanar CT image reconstructions and MIPs were obtained to evaluate the vascular anatomy. Carotid stenosis measurements (when applicable) are obtained utilizing NASCET criteria, using the distal internal carotid diameter as the denominator. CONTRAST:  31mL OMNIPAQUE IOHEXOL 350 MG/ML SOLN COMPARISON:  None. FINDINGS: CTA NECK Aortic arch: Great vessel origins are patent with mild calcified plaque. Right carotid system: Patent. Patent mixed but primarily noncalcified plaque at the ICA origin causing less than 50% stenosis. Left carotid system: Patent. Mild calcified plaque at the ICA origin causing less than 50% stenosis. Vertebral arteries: Patent. Right vertebral artery is dominant. Left vertebral artery origin is not well seen due to  artifact. Skeleton: Multilevel degenerative changes of the cervical spine. Other neck: No mass or adenopathy. Upper chest: No apical lung mass. Review of the MIP images confirms the above findings CTA HEAD Anterior circulation: Intracranial internal carotid arteries are patent. Posterior circulation: Intracranial right vertebral artery is patent. Patent right PICA origin. Intracranial left vertebral artery becomes diminutive with high-grade stenosis or occlusion distally. Basilar artery is patent. Small left AICA appears to be present. There is high-grade stenosis near the superior cerebellar origins. The superior cerebellar artery origins are poorly opacified with reconstitution. Bilateral posterior communicating arteries are present. There is fetal origin of the right PCA. Venous sinuses: Patent as allowed by contrast bolus timing. Review of the MIP images confirms the above findings IMPRESSION: No large vessel occlusion. Plaque at the ICA origins causes less than 50% stenosis. Small caliber nondominant left vertebral artery becomes diminutive intracranially with high-grade stenosis or occlusion distally. A left PICA origin is not identified and may be congenitally absent. A small left AICA appears to be present. Focal high-grade stenosis of the basilar artery at the superior cerebellar artery origins. These results were called by telephone at the time of interpretation on 02/07/2020 at 9:39 pm to provider Dr. Iver Nestle, who verbally acknowledged these results. Electronically Signed   By: Guadlupe Spanish M.D.   On: 02/07/2020 21:41   CT HEAD  CODE STROKE WO CONTRAST  Result Date: 02/07/2020 CLINICAL DATA:  Code stroke.  Left-sided weakness EXAM: CT HEAD WITHOUT CONTRAST TECHNIQUE: Contiguous axial images were obtained from the base of the skull through the vertex without intravenous contrast. COMPARISON:  None. FINDINGS: Brain: There is no acute intracranial hemorrhage, mass effect, edema, or loss of gray-white  differentiation. Ventricles and sulci are normal in size and configuration. There is no extra-axial collection. Vascular: No hyperdense vessel. Atherosclerotic calcification is present at the skull base. Skull: Unremarkable. Sinuses/Orbits: Patchy ethmoid opacification. Orbits are unremarkable. Other: Mastoid air cells are clear. ASPECTS (Alberta Stroke Program Early CT Score) - Ganglionic level infarction (caudate, lentiform nuclei, internal capsule, insula, M1-M3 cortex): 7 - Supraganglionic infarction (M4-M6 cortex): 3 Total score (0-10 with 10 being normal): 10 IMPRESSION: No acute intracranial hemorrhage or evidence of acute infarction. ASPECT score is 10. These results were communicated to Dr. Iver Nestle at 9:10 pm on 02/07/2020 by text page via the [facility] messaging system. Electronically Signed   By: Guadlupe Spanish M.D.   On: 02/07/2020 21:13    Procedures Procedures (including critical care time)  Medications Ordered in ED Medications  LORazepam (ATIVAN) injection 1 mg (has no administration in time range)  iohexol (OMNIPAQUE) 350 MG/ML injection 80 mL (80 mLs Intravenous Contrast Given 02/07/20 2124)  clopidogrel (PLAVIX) tablet 300 mg (300 mg Oral Given 02/07/20 2203)  aspirin chewable tablet 324 mg (324 mg Oral Given 02/07/20 2200)    ED Course  I have reviewed the triage vital signs and the nursing notes.  Pertinent labs & imaging results that were available during my care of the patient were reviewed by me and considered in my medical decision making (see chart for details).    MDM Rules/Calculators/A&P                         Patient came as a code stroke.  Had acute onset of difficulty using her left arm slurred speech and double vision.  Improved by time she got here however his wax and wane somewhat while in the ER.  Head CT reassuring.  Seen by neurology with the plan of potentially from giving TPA, but then symptoms improved again.  Will require admission the hospital.  TIA work-up.   Recommendations from neurology has been placed.  Will discuss with hospitalist. Final Clinical Impression(s) / ED Diagnoses Final diagnoses:  TIA (transient ischemic attack)    Rx / DC Orders ED Discharge Orders    None       Benjiman Core, MD 02/07/20 2340

## 2020-02-07 NOTE — Code Documentation (Signed)
Responded to Code Stroke called at 2039 for L arm drift, slurred speech, headache, N/V, LSN-1930. Pt arrived at 2046, CBG-157,  NIH-0, CT head negative, CTA-no LVO. Pt placed on TIA alert.

## 2020-02-07 NOTE — ED Triage Notes (Signed)
Pt presents to ED BIB [facility] as a Code Stroke. LKW 1930, Per EMS pt complained of double vision, Lf sided weakness and difficulty getting her words out.   BP 126/80 HR 70 93% 3L [place]

## 2020-02-07 NOTE — Telephone Encounter (Signed)
Patient was released from the hospital on Sunday.  Patient was going to do her breathing treatment. Where the tube connects to the Nebulizer the stem broke off into the tube.  Patient is requesting a rx for a new nebulizer.

## 2020-02-07 NOTE — Telephone Encounter (Signed)
Kelly @ access nurse transferred pt back to me.  Stating her nebulizer is broke.  Pt broke stem off machine and needs a new nebulizer rx.  She [facility]  She stated she O2 and her cpap supplies are through them  Please call pt back when rx has been sent [facility] number [phone]

## 2020-02-08 ENCOUNTER — Observation Stay (HOSPITAL_COMMUNITY): Payer: Medicare Other

## 2020-02-08 ENCOUNTER — Encounter (HOSPITAL_COMMUNITY): Payer: Self-pay | Admitting: Family Medicine

## 2020-02-08 ENCOUNTER — Observation Stay (HOSPITAL_BASED_OUTPATIENT_CLINIC_OR_DEPARTMENT_OTHER): Payer: Medicare Other

## 2020-02-08 DIAGNOSIS — H748X3 Other specified disorders of middle ear and mastoid, bilateral: Secondary | ICD-10-CM | POA: Diagnosis not present

## 2020-02-08 DIAGNOSIS — R531 Weakness: Secondary | ICD-10-CM | POA: Diagnosis not present

## 2020-02-08 DIAGNOSIS — I5032 Chronic diastolic (congestive) heart failure: Secondary | ICD-10-CM | POA: Diagnosis present

## 2020-02-08 DIAGNOSIS — G459 Transient cerebral ischemic attack, unspecified: Secondary | ICD-10-CM

## 2020-02-08 DIAGNOSIS — I5022 Chronic systolic (congestive) heart failure: Secondary | ICD-10-CM | POA: Diagnosis present

## 2020-02-08 DIAGNOSIS — H538 Other visual disturbances: Secondary | ICD-10-CM | POA: Diagnosis not present

## 2020-02-08 DIAGNOSIS — R29818 Other symptoms and signs involving the nervous system: Secondary | ICD-10-CM | POA: Diagnosis not present

## 2020-02-08 DIAGNOSIS — I69352 Hemiplegia and hemiparesis following cerebral infarction affecting left dominant side: Secondary | ICD-10-CM | POA: Diagnosis not present

## 2020-02-08 DIAGNOSIS — I5042 Chronic combined systolic (congestive) and diastolic (congestive) heart failure: Secondary | ICD-10-CM | POA: Diagnosis present

## 2020-02-08 DIAGNOSIS — R4781 Slurred speech: Secondary | ICD-10-CM | POA: Diagnosis not present

## 2020-02-08 LAB — URINALYSIS, ROUTINE W REFLEX MICROSCOPIC
Bilirubin Urine: NEGATIVE
Glucose, UA: NEGATIVE mg/dL
Hgb urine dipstick: NEGATIVE
Ketones, ur: NEGATIVE mg/dL
Leukocytes,Ua: NEGATIVE
Nitrite: NEGATIVE
Protein, ur: NEGATIVE mg/dL
Specific Gravity, Urine: >1.046 — ABNORMAL HIGH (ref 1.005–1.030)
pH: 6 (ref 5.0–8.0)

## 2020-02-08 LAB — RAPID URINE DRUG SCREEN, HOSP PERFORMED
Amphetamines: NOT DETECTED
Barbiturates: NOT DETECTED
Benzodiazepines: NOT DETECTED
Cocaine: NOT DETECTED
Opiates: NOT DETECTED
Tetrahydrocannabinol: POSITIVE — AB

## 2020-02-08 LAB — CBG MONITORING, ED
Glucose-Capillary: 162 mg/dL — ABNORMAL HIGH (ref 70–99)
Glucose-Capillary: 112 mg/dL — ABNORMAL HIGH (ref 70–99)
Glucose-Capillary: 108 mg/dL — ABNORMAL HIGH (ref 70–99)
Glucose-Capillary: 106 mg/dL — ABNORMAL HIGH (ref 70–99)

## 2020-02-08 LAB — ECHOCARDIOGRAM LIMITED BUBBLE STUDY
AR max vel: 2.19 cm2
AV Area VTI: 2.31 cm2
AV Area mean vel: 2.22 cm2
AV Mean grad: 5 mmHg
AV Peak grad: 10.8 mmHg
Ao pk vel: 1.64 m/s
Area-P 1/2: 2.37 cm2
S' Lateral: 2.9 cm

## 2020-02-08 LAB — RPR: RPR Ser Ql: NONREACTIVE

## 2020-02-08 LAB — GLUCOSE, CAPILLARY: Glucose-Capillary: 157 mg/dL — ABNORMAL HIGH (ref 70–99)

## 2020-02-08 LAB — SARS CORONAVIRUS 2 BY RT PCR (HOSPITAL ORDER, PERFORMED IN ~~LOC~~ HOSPITAL LAB): SARS Coronavirus 2: NEGATIVE

## 2020-02-08 LAB — TSH: TSH: 0.821 u[IU]/mL (ref 0.350–4.500)

## 2020-02-08 MED ORDER — INSULIN ASPART 100 UNIT/ML ~~LOC~~ SOLN
0.0000 [IU] | Freq: Three times a day (TID) | SUBCUTANEOUS | Status: DC
  Administered 2020-02-10: 1 [IU] via SUBCUTANEOUS
  Administered 2020-02-11: 2 [IU] via SUBCUTANEOUS
  Administered 2020-02-11: 1 [IU] via SUBCUTANEOUS

## 2020-02-08 MED ORDER — ASPIRIN EC 81 MG PO TBEC: 81.0000 mg | DELAYED_RELEASE_TABLET | Freq: Every day | ORAL | Status: DC

## 2020-02-08 MED ORDER — PERFLUTREN LIPID MICROSPHERE
1.0000 mL | INTRAVENOUS | Status: AC | PRN
  Administered 2020-02-08: 5 mL via INTRAVENOUS
  Filled 2020-02-08: qty 10

## 2020-02-08 MED ORDER — FLUTICASONE FUROATE-VILANTEROL 100-25 MCG/INH IN AEPB
1.0000 | INHALATION_SPRAY | Freq: Every day | RESPIRATORY_TRACT | Status: DC
  Administered 2020-02-08 – 2020-02-14 (×4): 1 via RESPIRATORY_TRACT
  Filled 2020-02-08: qty 28

## 2020-02-08 MED ORDER — UMECLIDINIUM BROMIDE 62.5 MCG/INH IN AEPB
1.0000 | INHALATION_SPRAY | Freq: Every day | RESPIRATORY_TRACT | Status: DC
  Administered 2020-02-08 – 2020-02-14 (×4): 1 via RESPIRATORY_TRACT
  Filled 2020-02-08: qty 7

## 2020-02-08 MED ORDER — DM-GUAIFENESIN ER 30-600 MG PO TB12: 1.0000 | ORAL_TABLET | Freq: Two times a day (BID) | ORAL | Status: DC | PRN

## 2020-02-08 MED ORDER — ATORVASTATIN CALCIUM 10 MG PO TABS
20.0000 mg | ORAL_TABLET | Freq: Every day | ORAL | Status: DC
  Administered 2020-02-08 (×2): 20 mg via ORAL
  Filled 2020-02-08 (×2): qty 2

## 2020-02-08 MED ORDER — FUROSEMIDE 20 MG PO TABS: 20.0000 mg | ORAL_TABLET | Freq: Every day | ORAL | Status: DC

## 2020-02-08 MED ORDER — ACETAMINOPHEN 160 MG/5ML PO SOLN: 650.0000 mg | ORAL | Status: DC | PRN

## 2020-02-08 MED ORDER — SODIUM CHLORIDE 0.9 % IV SOLN: INTRAVENOUS | Status: DC

## 2020-02-08 MED ORDER — ACETAMINOPHEN 650 MG RE SUPP: 650.0000 mg | RECTAL | Status: DC | PRN

## 2020-02-08 MED ORDER — ACETAMINOPHEN 325 MG PO TABS
650.0000 mg | ORAL_TABLET | ORAL | Status: DC | PRN
  Administered 2020-02-10 – 2020-02-13 (×2): 650 mg via ORAL
  Filled 2020-02-08 (×2): qty 2

## 2020-02-08 MED ORDER — ALBUTEROL SULFATE (2.5 MG/3ML) 0.083% IN NEBU: 2.5000 mg | INHALATION_SOLUTION | Freq: Four times a day (QID) | RESPIRATORY_TRACT | Status: DC | PRN

## 2020-02-08 MED ORDER — ENOXAPARIN SODIUM 40 MG/0.4ML ~~LOC~~ SOLN
40.0000 mg | Freq: Every day | SUBCUTANEOUS | Status: DC
  Administered 2020-02-10 – 2020-02-11 (×2): 40 mg via SUBCUTANEOUS
  Filled 2020-02-08 (×2): qty 0.4

## 2020-02-08 MED ORDER — ASPIRIN EC 325 MG PO TBEC: 325.0000 mg | DELAYED_RELEASE_TABLET | Freq: Every day | ORAL | Status: DC

## 2020-02-08 MED ORDER — CLOPIDOGREL BISULFATE 75 MG PO TABS: 75.0000 mg | ORAL_TABLET | Freq: Every day | ORAL | Status: DC

## 2020-02-08 MED ORDER — BUDESON-GLYCOPYRROL-FORMOTEROL 160-9-4.8 MCG/ACT IN AERO: 2.0000 | INHALATION_SPRAY | Freq: Two times a day (BID) | RESPIRATORY_TRACT | Status: DC

## 2020-02-08 MED ORDER — SENNOSIDES-DOCUSATE SODIUM 8.6-50 MG PO TABS: 1.0000 | ORAL_TABLET | Freq: Every evening | ORAL | Status: DC | PRN

## 2020-02-08 MED ORDER — INSULIN ASPART 100 UNIT/ML ~~LOC~~ SOLN: 0.0000 [IU] | Freq: Every day | SUBCUTANEOUS | Status: DC

## 2020-02-08 MED ORDER — STROKE: EARLY STAGES OF RECOVERY BOOK
Freq: Once | Status: AC
  Filled 2020-02-08: qty 1

## 2020-02-08 MED ORDER — PANTOPRAZOLE SODIUM 40 MG PO TBEC
40.0000 mg | DELAYED_RELEASE_TABLET | Freq: Every day | ORAL | Status: DC
  Administered 2020-02-10 – 2020-02-14 (×4): 40 mg via ORAL
  Filled 2020-02-08 (×4): qty 1

## 2020-02-08 NOTE — Telephone Encounter (Signed)
Brenda [name] @ [facility] called stating she needs Copy of insurance card Demographics  Last office note with dx why she needs nebulizer  Please fax to [phone]

## 2020-02-08 NOTE — ED Notes (Signed)
Attempted to call report x 1. On hold for 8 mins

## 2020-02-08 NOTE — ED Notes (Signed)
Pt returned from MRI °

## 2020-02-08 NOTE — Progress Notes (Signed)
PROGRESS NOTE    Brenda [name]  WUJ:[id number] DOB: Aug 26, 1960 DOA: 02/07/2020 PCP: [name], MD   Chief Complaint  Patient presents with  . Code Stroke    Brief Narrative:  59 year old lady with prior history of COPD, chronic hypoxic respiratory failure on 3 L of nasal cannula oxygen at home, chronic chronic combined CHF, obstructive sleep apnea on CPAP, type 2 diabetes mellitus, stage IIIa CKD, recent admission for NSTEMI, presents to ED with left-sided weakness, slurred speech and nausea and vomiting.  Initial CT of the head without contrast negative for acute hemorrhage or infarction.  CT angiogram of the head and neck showed basilar artery stenosis.  Neurology was consulted, IR was consulted and plan for cerebral angiogram today.  Assessment & Plan:   Principal Problem:   TIA (transient ischemic attack) Active Problems:   Leukocytosis   OSA on CPAP   Type 2 diabetes, controlled, with peripheral neuropathy (HCC)   Chronic respiratory failure with hypoxia (HCC)   COPD  GOLD II/III still smoking/ 02 dep    Chronic kidney disease, stage 3a   LFT elevation   Chronic combined systolic and diastolic CHF (congestive heart failure) (HCC)   Posterior circulation TIA probably secondary to basilar artery stenosis. CT head with contrast does not  show any acute finding.  CT angiogram of the head and neck Focal high-grade stenosis of the basilar artery at the superior cerebellar artery origins.  IR consulted and plan for cerebral angiogram later today. Aspirin and plavix daily, following Plavix bolus. Continue with lipitor 20 mg daily.  Further TIA work-up with echocardiogram and MRI of the brain ordered. PT/OT/SLP evaluations ordered. LDL/hemoglobin A1c are pending. Urine tox positive for tetrahydrocannabinol.    History of chronic combined systolic and diastolic heart failure Patient appears to be compensated and is on 3 L of nasal cannula oxygen.  Repeat echocardiogram  ordered.   Obstructive sleep apnea on CPAP at night continue the same.   Chronic respiratory failure with hypoxia on 3 L of nasal cannula oxygen Probably secondary to obstructive sleep apnea, COPD,, chronic, and heart failure.  Stage III CKD Creatinine appears to be at baseline at this time.    Leukocytosis WBC count at 18,000, patient remains afebrile.  Repeat CBC in the morning. Covid screening test is negative.  Urine analysis and chest x-ray are negative for infection.   Type 2 DM:  CBG (last 3)  Recent Labs    02/08/20 0101 02/08/20 0750 02/08/20 1215  GLUCAP 162* 112* 108*   Resume SSI. Hemoglobin A1c pending   COPD No wheezing heard on exam continue with bronchodilators.  DVT prophylaxis: Lovenox Code Status: Full code Family Communication: None at bedside Disposition:   Status is: Observation  The patient will require care spanning > 2 midnights and should be moved to inpatient because: Ongoing diagnostic testing needed not appropriate for outpatient work up, Unsafe d/c plan and Inpatient level of care appropriate due to severity of illness  Dispo: The patient is from: Home              Anticipated d/c is to: Pending              Anticipated d/c date is: 2 days              Patient currently is not medically stable to d/c.       Consultants:   Neurology  IR  Procedures: (CT angiogram of the head and neck MRI of the brain  Echocardiogram Cerebral angiogram  Antimicrobials: None  Subjective: Patient appears anxious about the procedure.  Objective: Vitals:   02/08/20 1000 02/08/20 1015 02/08/20 1030 02/08/20 1115  BP: 109/60 (!) 105/53 (!) 117/56 112/60  Pulse: 67 73 82 65  Resp: Temp:      TempSrc:      SpO2: 97% 98% 97% 96%    Intake/Output Summary (Last 24 hours) at 02/08/2020 1127 Last data filed at 02/08/2020 0925 Gross per 24 hour  Intake --  Output 300 ml  Net -300 ml   There were no vitals filed for this  visit.  Examination:  General exam: Appears calm and comfortable  Respiratory system: Clear to auscultation. Respiratory effort normal. Cardiovascular system: S1 & S2 heard, RRR. No JVD,  pedal edema. Gastrointestinal system: Abdomen is nondistended, soft and nontender.Normal bowel sounds heard. Central nervous system: Alert and oriented, no slurring of speech on exam today no focal deficits at this time. Extremities: Symmetric 5 x 5 power. Skin: No rashes, lesions or ulcers Psychiatry: Mood & affect appropriate.     Data Reviewed: I have personally reviewed following labs and imaging studies  CBC: Recent Labs  Lab 02/02/20 1446 02/03/20 1919 02/04/20 0645 02/07/20 2053 02/07/20 2055  WBC 12.0* 10.9* 16.8* 18.0*  --   NEUTROABS 10.0* 9.0* 10.4* 10.9*  --   HGB 12.7 12.9 13.1 14.4 15.3*  HCT 40.2 39.9 40.5 44.6 45.0  MCV 93.7 91.7 92.3 92.9  --   PLT 140* 183 192 277  --     Basic Metabolic Panel: Recent Labs  Lab 02/02/20 1446 02/03/20 1919 02/04/20 0645 02/07/20 2053 02/07/20 2055  NA 139 138 139 138 137  K 5.2* 5.2* 4.6 4.4 4.2  CL 101 103 102 98 100  CO2 --   GLUCOSE 151* 190* 126* 138* 139*  BUN 25* 24* 24* 21 24*  CREATININE 1.16* 1.10* 1.11* 1.38* 1.50*  CALCIUM 9.2 9.2 9.3 9.5  --     GFR: Estimated Creatinine Clearance: 43.3 mL/min (A) (by C-G formula based on SCr of 1.5 mg/dL (H)).  Liver Function Tests: Recent Labs  Lab 02/02/20 1446 02/03/20 1919 02/04/20 0645 02/07/20 2053  AST 166* 57* 35 23  ALT 924* 561* 457* 126*  ALKPHOS 86 87 76 88  BILITOT 0.9 0.5 0.7 0.5  PROT 6.6 6.5 6.5 7.0  ALBUMIN 3.3* 3.2* 3.1* 3.5    CBG: Recent Labs  Lab 02/04/20 0746 02/04/20 1133 02/07/20 2051 02/08/20 0101 02/08/20 0750  GLUCAP 124* 112* 157* 162* 112*     Recent Results (from the past 240 hour(s))  SARS Coronavirus 2 by RT PCR (hospital order, performed in [facility] hospital lab) Nasopharyngeal Nasopharyngeal Swab      Status: None   Collection Time: 01/30/20  9:52 AM   Specimen: Nasopharyngeal Swab  Result Value Ref Range Status   SARS Coronavirus 2 NEGATIVE NEGATIVE Final    Comment: (NOTE) SARS-CoV-2 target nucleic acids are NOT DETECTED.  The SARS-CoV-2 RNA is generally detectable in upper and lower respiratory specimens during the acute phase of infection. The lowest concentration of SARS-CoV-2 viral copies this assay can detect is 250 copies / mL. A negative result does not preclude SARS-CoV-2 infection and should not be used as the sole basis for treatment or other patient management decisions.  A negative result may occur with improper specimen collection / handling, submission of specimen other than nasopharyngeal swab, presence of viral mutation(s)  within the areas targeted by this assay, and inadequate number of viral copies (<250 copies / mL). A negative result must be combined with clinical observations, patient history, and epidemiological information.  Fact Sheet for Patients:   BoilerBrush.com.cy  Fact Sheet for Healthcare Providers: https://pope.com/  This test is not yet approved or  cleared by the [place] FDA and has been authorized for detection and/or diagnosis of SARS-CoV-2 by FDA under an Emergency Use Authorization (EUA).  This EUA will remain in effect (meaning this test can be used) for the duration of the COVID-19 declaration under Section 564(b)(1) of the Act, 21 U.S.C. section 360bbb-3(b)(1), unless the authorization is terminated or revoked sooner.  Performed at [facility], [address] [place], [place] [address]   Expectorated sputum assessment w rflx to resp cult     Status: None   Collection Time: 01/31/20  4:48 AM   Specimen: Expectorated Sputum  Result Value Ref Range Status   Specimen Description EXPECTORATED SPUTUM  Final   Special Requests NONE  Final   Sputum evaluation   Final     Sputum specimen not acceptable for testing.  Please recollect.   RESULT CALLED TO, READ BACK BY AND VERIFIED WITH: C GOAD 01/31/20 0557 JDW Performed at [facility], [address], [place], [place] [address]    Report Status 01/31/2020 FINAL  Final  MRSA PCR Screening     Status: None   Collection Time: 01/31/20  8:46 PM   Specimen: Nasal Mucosa; Nasopharyngeal  Result Value Ref Range Status   MRSA by PCR NEGATIVE NEGATIVE Final    Comment:        The GeneXpert MRSA Assay (FDA approved for NASAL specimens only), is one component of a comprehensive MRSA colonization surveillance program. It is not intended to diagnose MRSA infection nor to guide or monitor treatment for MRSA infections. Performed at [facility], [address] [place], [place] [address]   Respiratory Panel by PCR     Status: Abnormal   Collection Time: 02/02/20  8:57 PM   Specimen: Nasopharyngeal Swab; Respiratory  Result Value Ref Range Status   Adenovirus NOT DETECTED NOT DETECTED Final   Coronavirus 229E NOT DETECTED NOT DETECTED Final    Comment: (NOTE) The Coronavirus on the Respiratory Panel, DOES NOT test for the novel  Coronavirus (2019 nCoV)    Coronavirus HKU1 NOT DETECTED NOT DETECTED Final   Coronavirus NL63 NOT DETECTED NOT DETECTED Final   Coronavirus OC43 NOT DETECTED NOT DETECTED Final   Metapneumovirus NOT DETECTED NOT DETECTED Final   Rhinovirus / Enterovirus NOT DETECTED NOT DETECTED Final   Influenza A NOT DETECTED NOT DETECTED Final   Influenza B NOT DETECTED NOT DETECTED Final   Parainfluenza Virus 1 NOT DETECTED NOT DETECTED Final   Parainfluenza Virus 2 NOT DETECTED NOT DETECTED Final   Parainfluenza Virus 3 NOT DETECTED NOT DETECTED Final   Parainfluenza Virus 4 NOT DETECTED NOT DETECTED Final   Respiratory Syncytial Virus DETECTED (A) NOT DETECTED Final   Bordetella pertussis NOT DETECTED NOT DETECTED Final   Chlamydophila pneumoniae NOT DETECTED NOT DETECTED Final    Mycoplasma pneumoniae NOT DETECTED NOT DETECTED Final    Comment: Performed at [facility], [address] [place], [place] [address]  SARS Coronavirus 2 by RT PCR (hospital order, performed in [facility] hospital lab) Nasopharyngeal Nasopharyngeal Swab     Status: None   Collection Time: 02/07/20  1:35 AM  Specimen: Nasopharyngeal Swab  Result Value Ref Range Status   SARS Coronavirus 2 NEGATIVE NEGATIVE Final    Comment: (NOTE) SARS-CoV-2 target nucleic acids are NOT DETECTED.  The SARS-CoV-2 RNA is generally detectable in upper and lower respiratory specimens during the acute phase of infection. The lowest concentration of SARS-CoV-2 viral copies this assay can detect is 250 copies / mL. A negative result does not preclude SARS-CoV-2 infection and should not be used as the sole basis for treatment or other patient management decisions.  A negative result may occur with improper specimen collection / handling, submission of specimen other than nasopharyngeal swab, presence of viral mutation(s) within the areas targeted by this assay, and inadequate number of viral copies (<250 copies / mL). A negative result must be combined with clinical observations, patient history, and epidemiological information.  Fact Sheet for Patients:   BoilerBrush.com.cy  Fact Sheet for Healthcare Providers: https://pope.com/  This test is not yet approved or  cleared by the [place] FDA and has been authorized for detection and/or diagnosis of SARS-CoV-2 by FDA under an Emergency Use Authorization (EUA).  This EUA will remain in effect (meaning this test can be used) for the duration of the COVID-19 declaration under Section 564(b)(1) of the Act, 21 U.S.C. section 360bbb-3(b)(1), unless the authorization is terminated or revoked sooner.  Performed at [facility], [address], [place], [place] [address]           Radiology Studies: CT Angio Head W or Wo Contrast  Result Date: 02/07/2020 CLINICAL DATA:  Left-sided weakness, code stroke follow-up EXAM: CT ANGIOGRAPHY HEAD AND NECK TECHNIQUE: Multidetector CT imaging of the head and neck was performed using the standard protocol during bolus administration of intravenous contrast. Multiplanar CT image reconstructions and MIPs were obtained to evaluate the vascular anatomy. Carotid stenosis measurements (when applicable) are obtained utilizing NASCET criteria, using the distal internal carotid diameter as the denominator. CONTRAST:  47mL OMNIPAQUE IOHEXOL 350 MG/ML SOLN COMPARISON:  None. FINDINGS: CTA NECK Aortic arch: Great vessel origins are patent with mild calcified plaque. Right carotid system: Patent. Patent mixed but primarily noncalcified plaque at the ICA origin causing less than 50% stenosis. Left carotid system: Patent. Mild calcified plaque at the ICA origin causing less than 50% stenosis. Vertebral arteries: Patent. Right vertebral artery is dominant. Left vertebral artery origin is not well seen due to artifact. Skeleton: Multilevel degenerative changes of the cervical spine. Other neck: No mass or adenopathy. Upper chest: No apical lung mass. Review of the MIP images confirms the above findings CTA HEAD Anterior circulation: Intracranial internal carotid arteries are patent. Posterior circulation: Intracranial right vertebral artery is patent. Patent right PICA origin. Intracranial left vertebral artery becomes diminutive with high-grade stenosis or occlusion distally. Basilar artery is patent. Small left AICA appears to be present. There is high-grade stenosis near the superior cerebellar origins. The superior cerebellar artery origins are poorly opacified with reconstitution. Bilateral posterior communicating arteries are present. There is fetal origin of the right PCA. Venous sinuses: Patent as allowed by contrast bolus timing. Review of the MIP  images confirms the above findings IMPRESSION: No large vessel occlusion. Plaque at the ICA origins causes less than 50% stenosis. Small caliber nondominant left vertebral artery becomes diminutive intracranially with high-grade stenosis or occlusion distally. A left PICA origin is not identified and may be congenitally absent. A small left AICA appears to be present. Focal high-grade stenosis of the basilar artery at the superior cerebellar artery origins.  These results were called by telephone at the time of interpretation on 02/07/2020 at 9:39 pm to provider Dr. Iver NestleBhagat, who verbally acknowledged these results. Electronically Signed   By: Guadlupe SpanishPraneil  Patel M.D.   On: 02/07/2020 21:41   CT Angio Neck W and/or Wo Contrast  Result Date: 02/07/2020 CLINICAL DATA:  Left-sided weakness, code stroke follow-up EXAM: CT ANGIOGRAPHY HEAD AND NECK TECHNIQUE: Multidetector CT imaging of the head and neck was performed using the standard protocol during bolus administration of intravenous contrast. Multiplanar CT image reconstructions and MIPs were obtained to evaluate the vascular anatomy. Carotid stenosis measurements (when applicable) are obtained utilizing NASCET criteria, using the distal internal carotid diameter as the denominator. CONTRAST:  80mL OMNIPAQUE IOHEXOL 350 MG/ML SOLN COMPARISON:  None. FINDINGS: CTA NECK Aortic arch: Great vessel origins are patent with mild calcified plaque. Right carotid system: Patent. Patent mixed but primarily noncalcified plaque at the ICA origin causing less than 50% stenosis. Left carotid system: Patent. Mild calcified plaque at the ICA origin causing less than 50% stenosis. Vertebral arteries: Patent. Right vertebral artery is dominant. Left vertebral artery origin is not well seen due to artifact. Skeleton: Multilevel degenerative changes of the cervical spine. Other neck: No mass or adenopathy. Upper chest: No apical lung mass. Review of the MIP images confirms the above findings  CTA HEAD Anterior circulation: Intracranial internal carotid arteries are patent. Posterior circulation: Intracranial right vertebral artery is patent. Patent right PICA origin. Intracranial left vertebral artery becomes diminutive with high-grade stenosis or occlusion distally. Basilar artery is patent. Small left AICA appears to be present. There is high-grade stenosis near the superior cerebellar origins. The superior cerebellar artery origins are poorly opacified with reconstitution. Bilateral posterior communicating arteries are present. There is fetal origin of the right PCA. Venous sinuses: Patent as allowed by contrast bolus timing. Review of the MIP images confirms the above findings IMPRESSION: No large vessel occlusion. Plaque at the ICA origins causes less than 50% stenosis. Small caliber nondominant left vertebral artery becomes diminutive intracranially with high-grade stenosis or occlusion distally. A left PICA origin is not identified and may be congenitally absent. A small left AICA appears to be present. Focal high-grade stenosis of the basilar artery at the superior cerebellar artery origins. These results were called by telephone at the time of interpretation on 02/07/2020 at 9:39 pm to provider Dr. Iver NestleBhagat, who verbally acknowledged these results. Electronically Signed   By: Guadlupe SpanishPraneil  Patel M.D.   On: 02/07/2020 21:41   MR BRAIN WO CONTRAST  Result Date: 02/08/2020 CLINICAL DATA:  Initial evaluation for acute double vision, left-sided weakness, speech difficulty. EXAM: MRI HEAD WITHOUT CONTRAST TECHNIQUE: Multiplanar, multiecho pulse sequences of the brain and surrounding structures were obtained without intravenous contrast. COMPARISON:  Prior CTs from 02/07/2020. FINDINGS: Brain: Examination degraded by motion artifact. Cerebral volume within normal limits for age. Few scattered foci of T2/FLAIR hyperintensity noted involving the supratentorial cerebral white matter, nonspecific, but felt to be  within normal limits for age and of doubtful significance. No abnormal foci of restricted diffusion to suggest acute or subacute ischemia. Gray-white matter differentiation maintained. No encephalomalacia to suggest chronic cortical infarction. No foci of susceptibility artifact to suggest acute or chronic intracranial hemorrhage. No mass lesion, midline shift or mass effect. No hydrocephalus or extra-axial fluid collection. Incidental note made of an empty sella. Midline structures intact. Vascular: Major intracranial vascular flow voids are maintained. Skull and upper cervical spine: Craniocervical junction within normal limits. No visible focal marrow replacing  lesion. No scalp soft tissue abnormality. Sinuses/Orbits: Globes and orbital soft tissues within normal limits. Mild scattered mucosal thickening noted within the ethmoidal air cells. Paranasal sinuses are otherwise clear. Small right greater than left mastoid effusions, of doubtful significance. Inner ear structures grossly normal. Visualized nasopharynx within normal limits. Other: None. IMPRESSION: 1. No acute intracranial abnormality. 2. Empty sella. While this finding is often incidental in nature and of no clinical significance, this can also be seen in the setting of idiopathic intracranial hypertension. 3. Otherwise unremarkable and normal brain MRI for age. Electronically Signed   By: Rise Mu M.D.   On: 02/08/2020 02:39   DG CHEST PORT 1 VIEW  Result Date: 02/08/2020 CLINICAL DATA:  Code stroke patient. Double vision, left-sided weakness and difficulty getting words out. History of asthma, hypertension, bronchitis, COPD. EXAM: PORTABLE CHEST 1 VIEW COMPARISON:  01/30/2020 FINDINGS: Shallow inspiration. Heart size and pulmonary vascularity are normal. Mild infiltrates in the lung bases with improvement since previous study. No pleural effusions. No pneumothorax. Mediastinal contours appear intact. IMPRESSION: Mild infiltrates in the  lung bases with improvement since previous study. Electronically Signed   By: Burman Nieves M.D.   On: 02/08/2020 00:31   CT HEAD CODE STROKE WO CONTRAST  Result Date: 02/07/2020 CLINICAL DATA:  Code stroke.  Left-sided weakness EXAM: CT HEAD WITHOUT CONTRAST TECHNIQUE: Contiguous axial images were obtained from the base of the skull through the vertex without intravenous contrast. COMPARISON:  None. FINDINGS: Brain: There is no acute intracranial hemorrhage, mass effect, edema, or loss of gray-white differentiation. Ventricles and sulci are normal in size and configuration. There is no extra-axial collection. Vascular: No hyperdense vessel. Atherosclerotic calcification is present at the skull base. Skull: Unremarkable. Sinuses/Orbits: Patchy ethmoid opacification. Orbits are unremarkable. Other: Mastoid air cells are clear. ASPECTS ([place] Stroke Program Early CT Score) - Ganglionic level infarction (caudate, lentiform nuclei, internal capsule, insula, M1-M3 cortex): 7 - Supraganglionic infarction (M4-M6 cortex): 3 Total score (0-10 with 10 being normal): 10 IMPRESSION: No acute intracranial hemorrhage or evidence of acute infarction. ASPECT score is 10. These results were communicated to Dr. Iver Nestle at 9:10 pm on 02/07/2020 by text page via the [facility] messaging system. Electronically Signed   By: Guadlupe Spanish M.D.   On: 02/07/2020 21:13        Scheduled Meds: .  stroke: mapping our early stages of recovery book   Does not apply Once  . aspirin EC  325 mg Oral Daily  . atorvastatin  20 mg Oral QHS  . clopidogrel  75 mg Oral Daily  . enoxaparin (LOVENOX) injection  40 mg Subcutaneous Daily  . fluticasone furoate-vilanterol  1 puff Inhalation Daily   And  . umeclidinium bromide  1 puff Inhalation Daily  . furosemide  20 mg Oral Daily  . insulin aspart  0-5 Units Subcutaneous QHS  . insulin aspart  0-9 Units Subcutaneous TID WC  . pantoprazole  40 mg Oral Daily   Continuous Infusions: .  sodium chloride       LOS: 0 days       Kathlen Mody, MD [facility]   To contact the attending provider between 7A-7P or the covering provider during after hours 7P-7A, please log into the web site [email or web address] and access using universal [place] password for that web site. If you do not have the password, please call the hospital operator.  02/08/2020, 11:27 AM

## 2020-02-08 NOTE — Progress Notes (Signed)
RT placed pt on cpap for the night. RT will continue to monitor as needed.

## 2020-02-08 NOTE — Progress Notes (Signed)
Patient will need bed placement with ability to recover after arterial puncture/sheath placement.  Procedure deferred until bed placement obtained.   NPO p MN for possible angiogram 9/10.  Loyce Dys, MS RD PA-C

## 2020-02-08 NOTE — ED Notes (Signed)
CBG 162 

## 2020-02-08 NOTE — ED Notes (Signed)
Pt transported to MRI via stretcher.  

## 2020-02-08 NOTE — Consult Note (Signed)
Chief Complaint: Patient was seen in consultation today for TIA  Referring Physician(s): Dr. Roda Shutters  Supervising Physician: Julieanne Cotton  Patient Status: [facility] - Out-pt  History of Present Illness: Brenda [name] is a 59 y.o. female with past medical history for significant for COPD, chronic hypoxis respiratory failure, chronic combined CHF, OSA on CPAP, DM2, CKD, prior smoker who presented to [facility] ED with left-sided weakness, slurred speech, double vision.  CT Head showed no acute abnormality.  CTA Head/Neck showed focal high-grade stenosis of the basilar artery at the superior cerebellar artery origins. IR consulted for diagnostic angiogram at the request of Dr. Roda Shutters.   Patient assessed at bedside alongside Dr. Corliss Skains. She is stable.  On home settings for O2- 3 L [place].  She does report "round-the-clock" nebulizer treatments at home, none since admission.  She confirms the presentation of her symptoms as described above and states all symptoms has since resolved.  Feels she is back to her baseline.  She has been NPO.  She does not take blood thinners.   Past Medical History:  Diagnosis Date  . Acute pancreatitis   . Asthma    main dyspnea thought due to deconditioning (10/2013)  . Bipolar I disorder, most recent episode (or current) manic, unspecified    pt denies this  . Bronchitis, chronic obstructive (HCC) 2008 & 2009   FeV1 64% TLC 105% DLCO 55% 2008  -  FeV1 81% FeF 25-75 43% 2009  . COPD (chronic obstructive pulmonary disease) (HCC)    emphysema, not an issue per pulm (10/2013)  . History of pyelonephritis 05/2012  . HTN (hypertension)   . Hx of migraines   . Knee pain, right   . Leukocytosis, unspecified   . Lumbar disc disease with radiculopathy    multilevel spondylitic changes, scoliosis, and anterolisthesis L4 not thought to currently be surg candidate (Dr. Phoebe Perch, [facility]) (Dr. Ollen Bowl, [place])  . Nicotine addiction   . Nonspecific abnormal electrocardiogram (ECG)  (EKG)   . Obesity   . Pure hyperglyceridemia   . RSV (respiratory syncytial virus pneumonia) 01/2020   hospitalization  . Suicide attempt (HCC) 06/2001  . Swelling of limb   . Urge and stress incontinence 07/2012   ([facility])    Past Surgical History:  Procedure Laterality Date  . CHOLECYSTECTOMY  07/1982  . CT MAXILLOFACIAL WO/W CM  06/14/06   Left max mucocele  . CT of chest  06/14/2006   Normal except c/w active inflammation / infection.  Left renal atrophy  . ERCP / sphincterotomy - stenosis  01/15/06  . lumbar MRI  11/2010   multilevel spondylitic changes with upper lumbar scoliosis and anterolisthesis of L4 on 5 with some L foraminal narrowing esp at L/3 with concavity of scoliosis, some central stenosis and biforaminal narrowing at L4/5 with spondylolisthesis  . [place] - Pneumonia, acute asthma, left maxillary sinusitis  01/11 - 06/18/2006  . Retained stone     ERCP secondary to retained stone  . Right knee surgery  1984,1989,1989,1991,1994   Reconstructions for ACL insuff  . TUBAL LIGATION      Allergies: Metolazone  Medications: Prior to Admission medications   Medication Sig Start Date End Date Taking? Authorizing Provider  albuterol (PROVENTIL) (2.5 MG/3ML) 0.083% nebulizer solution INHALE 1 VIAL VIA NEBULIZER EVERY 6 HOURS AS NEEDED FOR WHEEZING/SHORTNESS OF BREATH Patient taking differently: Take 2.5 mg by nebulization every 6 (six) hours as needed for wheezing or shortness of breath.  05/11/18  Yes [name],  MD  aspirin EC 81 MG EC tablet Take 1 tablet (81 mg total) by mouth daily. Swallow whole. 02/05/20  Yes Narda Bonds, MD  atorvastatin (LIPITOR) 20 MG tablet Take 20 mg by mouth at bedtime. 01/01/20  Yes [provider]  benzonatate (TESSALON) 200 MG capsule Take 1 capsule (200 mg total) by mouth 3 (three) times daily as needed for cough. Swallow whole, to not bite pill 04/22/18  Yes Parrett, Tammy S, NP  Budeson-Glycopyrrol-Formoterol  (BREZTRI AEROSPHERE) 160-9-4.8 MCG/ACT AERO Inhale 2 puffs into the lungs 2 (two) times daily. 05/30/19  Yes Nyoka Cowden, MD  dextromethorphan-guaiFENesin [facility] DM) 30-600 MG 12hr tablet Take 1 tablet by mouth 2 (two) times daily as needed for cough.   Yes [provider]  escitalopram (LEXAPRO) 20 MG tablet TAKE 1 AND 1/2 TABLETS DAILY BY MOUTH Patient taking differently: Take 30 mg by mouth daily.  01/04/20  Yes [name], MD  famotidine (PEPCID) 20 MG tablet Take 20 mg by mouth at bedtime. 12/15/19  Yes [provider]  fluticasone (FLONASE) 50 MCG/ACT nasal spray Place 1 spray into both nostrils 2 (two) times daily. 05/30/19  Yes Nyoka Cowden, MD  furosemide (LASIX) 20 MG tablet Take 1 tablet (20 mg total) by mouth daily. 02/04/20  Yes Narda Bonds, MD  gabapentin (NEURONTIN) 300 MG capsule TAKE 2 TABLETS IN THE MORNING AND 3 TABLETS AT NIGHT Patient taking differently: Take 600-900 mg by mouth See admin instructions. TAKE 2 TABLETS IN THE MORNING AND 3 TABLETS AT NIGHT 09/24/19  Yes [name], MD  LORazepam (ATIVAN) 0.5 MG tablet TAKE 1/2 TO 1 TABLET BY MOUTH TWICE A DAY AS NEEDED FOR ANXIETY Patient taking differently: Take 0.25-0.5 mg by mouth 2 (two) times daily as needed for anxiety.  01/17/20  Yes [name], MD  losartan (COZAAR) 25 MG tablet Take 0.5 tablets (12.5 mg total) by mouth daily. 02/04/20 03/05/20 Yes Narda Bonds, MD  metFORMIN (GLUCOPHAGE) 500 MG tablet TAKE 1 TABLET BY MOUTH EVERYDAY AT BEDTIME Patient taking differently: Take 500 mg by mouth daily with breakfast.  10/02/19  Yes [name], MD  metoprolol succinate (TOPROL-XL) 25 MG 24 hr tablet Take 0.5 tablets (12.5 mg total) by mouth daily. 02/05/20 03/06/20 Yes Narda Bonds, MD  Multiple Vitamin (MULTIVITAMIN WITH MINERALS) TABS tablet Take 1 tablet by mouth daily.   Yes [provider]  nystatin (MYCOSTATIN) 100000 UNIT/ML suspension TAKE 1 TEASPOONFUL BY MOUTH  3 TIMES DAILY AS NEEDED FOR MOUTH SORES. Patient taking differently: Take 5 mLs by mouth 3 (three) times daily as needed (mouth sores).  04/22/18  Yes Parrett, Tammy S, NP  pantoprazole (PROTONIX) 40 MG tablet TAKE 1 TABLET BY MOUTH EVERY DAY 30 TO 60 MINUTES BEFORE THE FIRST MEAL OF THE DAY Patient taking differently: Take 40 mg by mouth daily.  06/10/18  Yes Nyoka Cowden, MD  PROAIR RESPICLICK 108 (90 Base) MCG/ACT AEPB INHALE 2 PUFFS INTO THE LUNGS EVERY 6 HOURS AS NEEDED FOR DYSPNEA/WHEEZE Patient taking differently: Inhale 2 puffs into the lungs every 6 (six) hours as needed (wheezing or shortness of breath).  08/09/19  Yes [name], MD  promethazine-dextromethorphan (PROMETHAZINE-DM) 6.25-15 MG/5ML syrup Take 5 mLs by mouth 4 (four) times daily as needed for cough. 09/15/19  Yes Nyoka Cowden, MD  feeding supplement, GLUCERNA SHAKE, (GLUCERNA SHAKE) LIQD Take 237 mLs by mouth 2 (two) times daily between meals. Patient not taking: Reported on 02/08/2020 02/04/20  Narda Bonds, MD     Family History  Problem Relation Age of Onset  . Heart disease Brother        MI in early 63's  . Hypertension Mother   . Hyperlipidemia Mother   . Heart disease Mother   . Hypertension Father   . Asthma Father   . Breast cancer Paternal Grandmother     Social History   Socioeconomic History  . Marital status: Widowed    Spouse name: Not on file  . Number of children: 3  . Years of education: Not on file  . Highest education level: Not on file  Occupational History  . Occupation: Administrator, arts: [employer]     Employer: [employer]   Tobacco Use  . Smoking status: Current Some Day Smoker    Years: 41.00    Types: Cigarettes  . Smokeless tobacco: Never Used  . Tobacco comment: 3 cigs per day 05/29/19 lmr  Vaping Use  . Vaping Use: Never used  Substance and Sexual Activity  . Alcohol use: Yes    Alcohol/week: 0.0 standard drinks    Comment:  occasional  . Drug use: No  . Sexual activity: Not on file  Other Topics Concern  . Not on file  Social History Narrative   Lives with husband and son and daughter in [place] and grandchild   Occupation: Media planner at [employer]   Activity: limited by dyspnea   Diet: good water, fruits/vegetables daily   Social Determinants of Health   Financial Resource Strain:   . Difficulty of Paying Living Expenses: Not on file  Food Insecurity:   . Worried About Programme researcher, broadcasting/film/video in the Last Year: Not on file  . Ran Out of Food in the Last Year: Not on file  Transportation Needs:   . Lack of Transportation (Medical): Not on file  . Lack of Transportation (Non-Medical): Not on file  Physical Activity:   . Days of Exercise per Week: Not on file  . Minutes of Exercise per Session: Not on file  Stress:   . Feeling of Stress : Not on file  Social Connections:   . Frequency of Communication with Friends and Family: Not on file  . Frequency of Social Gatherings with Friends and Family: Not on file  . Attends Religious Services: Not on file  . Active Member of Clubs or Organizations: Not on file  . Attends Banker Meetings: Not on file  . Marital Status: Not on file     Review of Systems: A 12 point ROS discussed and pertinent positives are indicated in the HPI above.  All other systems are negative.  Review of Systems  Constitutional: Negative for fatigue and fever.  Eyes: Positive for visual disturbance (double vision, resolved).  Respiratory: Negative for cough and shortness of breath.   Cardiovascular: Negative for chest pain.  Gastrointestinal: Positive for nausea and vomiting. Negative for abdominal pain.  Musculoskeletal: Negative for back pain.  Neurological: Positive for weakness (left-sided, resolved) and headaches.  Psychiatric/Behavioral: Negative for behavioral problems and confusion.    Vital Signs: BP (!) 117/56   Pulse 82   Temp 98.5  F (36.9 C) (Oral)   Resp 15   LMP 03/08/2013   SpO2 97%   Physical Exam Vitals and nursing note reviewed.  Constitutional:      Appearance: Normal appearance.  HENT:     Mouth/Throat:     Mouth: Mucous  membranes are moist.     Pharynx: Oropharynx is clear.  Cardiovascular:     Rate and Rhythm: Normal rate and regular rhythm.  Pulmonary:     Effort: Pulmonary effort is normal. No respiratory distress.     Breath sounds: Normal breath sounds.  Abdominal:     General: Abdomen is flat.     Palpations: Abdomen is soft.  Skin:    General: Skin is warm and dry.  Neurological:     General: No focal deficit present.     Mental Status: She is alert and oriented to person, place, and time. Mental status is at baseline.  Psychiatric:        Mood and Affect: Mood normal.        Behavior: Behavior normal.        Thought Content: Thought content normal.        Judgment: Judgment normal.      MD Evaluation Airway: WNL Heart: WNL Abdomen: WNL Chest/ Lungs: WNL ASA  Classification: 3 Mallampati/Airway Score: Two   Imaging: CT Angio Head W or Wo Contrast  Result Date: 02/07/2020 CLINICAL DATA:  Left-sided weakness, code stroke follow-up EXAM: CT ANGIOGRAPHY HEAD AND NECK TECHNIQUE: Multidetector CT imaging of the head and neck was performed using the standard protocol during bolus administration of intravenous contrast. Multiplanar CT image reconstructions and MIPs were obtained to evaluate the vascular anatomy. Carotid stenosis measurements (when applicable) are obtained utilizing NASCET criteria, using the distal internal carotid diameter as the denominator. CONTRAST:  80mL OMNIPAQUE IOHEXOL 350 MG/ML SOLN COMPARISON:  None. FINDINGS: CTA NECK Aortic arch: Great vessel origins are patent with mild calcified plaque. Right carotid system: Patent. Patent mixed but primarily noncalcified plaque at the ICA origin causing less than 50% stenosis. Left carotid system: Patent. Mild calcified  plaque at the ICA origin causing less than 50% stenosis. Vertebral arteries: Patent. Right vertebral artery is dominant. Left vertebral artery origin is not well seen due to artifact. Skeleton: Multilevel degenerative changes of the cervical spine. Other neck: No mass or adenopathy. Upper chest: No apical lung mass. Review of the MIP images confirms the above findings CTA HEAD Anterior circulation: Intracranial internal carotid arteries are patent. Posterior circulation: Intracranial right vertebral artery is patent. Patent right PICA origin. Intracranial left vertebral artery becomes diminutive with high-grade stenosis or occlusion distally. Basilar artery is patent. Small left AICA appears to be present. There is high-grade stenosis near the superior cerebellar origins. The superior cerebellar artery origins are poorly opacified with reconstitution. Bilateral posterior communicating arteries are present. There is fetal origin of the right PCA. Venous sinuses: Patent as allowed by contrast bolus timing. Review of the MIP images confirms the above findings IMPRESSION: No large vessel occlusion. Plaque at the ICA origins causes less than 50% stenosis. Small caliber nondominant left vertebral artery becomes diminutive intracranially with high-grade stenosis or occlusion distally. A left PICA origin is not identified and may be congenitally absent. A small left AICA appears to be present. Focal high-grade stenosis of the basilar artery at the superior cerebellar artery origins. These results were called by telephone at the time of interpretation on 02/07/2020 at 9:39 pm to provider Dr. Iver Nestle, who verbally acknowledged these results. Electronically Signed   By: Guadlupe Spanish M.D.   On: 02/07/2020 21:41   CT Angio Neck W and/or Wo Contrast  Result Date: 02/07/2020 CLINICAL DATA:  Left-sided weakness, code stroke follow-up EXAM: CT ANGIOGRAPHY HEAD AND NECK TECHNIQUE: Multidetector CT imaging of the head  and neck was  performed using the standard protocol during bolus administration of intravenous contrast. Multiplanar CT image reconstructions and MIPs were obtained to evaluate the vascular anatomy. Carotid stenosis measurements (when applicable) are obtained utilizing NASCET criteria, using the distal internal carotid diameter as the denominator. CONTRAST:  80mL OMNIPAQUE IOHEXOL 350 MG/ML SOLN COMPARISON:  None. FINDINGS: CTA NECK Aortic arch: Great vessel origins are patent with mild calcified plaque. Right carotid system: Patent. Patent mixed but primarily noncalcified plaque at the ICA origin causing less than 50% stenosis. Left carotid system: Patent. Mild calcified plaque at the ICA origin causing less than 50% stenosis. Vertebral arteries: Patent. Right vertebral artery is dominant. Left vertebral artery origin is not well seen due to artifact. Skeleton: Multilevel degenerative changes of the cervical spine. Other neck: No mass or adenopathy. Upper chest: No apical lung mass. Review of the MIP images confirms the above findings CTA HEAD Anterior circulation: Intracranial internal carotid arteries are patent. Posterior circulation: Intracranial right vertebral artery is patent. Patent right PICA origin. Intracranial left vertebral artery becomes diminutive with high-grade stenosis or occlusion distally. Basilar artery is patent. Small left AICA appears to be present. There is high-grade stenosis near the superior cerebellar origins. The superior cerebellar artery origins are poorly opacified with reconstitution. Bilateral posterior communicating arteries are present. There is fetal origin of the right PCA. Venous sinuses: Patent as allowed by contrast bolus timing. Review of the MIP images confirms the above findings IMPRESSION: No large vessel occlusion. Plaque at the ICA origins causes less than 50% stenosis. Small caliber nondominant left vertebral artery becomes diminutive intracranially with high-grade stenosis or  occlusion distally. A left PICA origin is not identified and may be congenitally absent. A small left AICA appears to be present. Focal high-grade stenosis of the basilar artery at the superior cerebellar artery origins. These results were called by telephone at the time of interpretation on 02/07/2020 at 9:39 pm to provider Dr. Iver Nestle, who verbally acknowledged these results. Electronically Signed   By: Guadlupe Spanish M.D.   On: 02/07/2020 21:41   CT CHEST WO CONTRAST  Result Date: 01/31/2020 CLINICAL DATA:  Respiratory distress, COPD exacerbation EXAM: CT CHEST WITHOUT CONTRAST TECHNIQUE: Multidetector CT imaging of the chest was performed following the standard protocol without IV contrast. COMPARISON:  Radiograph 01/30/2020, CT 09/07/2017 FINDINGS: Cardiovascular: Normal cardiac size. No pericardial effusion. Few scattered coronary artery calcifications are present. Atherosclerotic calcifications present in the normal caliber thoracic aorta and proximal great vessels which branch normally from the aortic arch. No periaortic stranding or hemorrhage. Central pulmonary arteries are normal caliber. Luminal evaluation precluded in the absence of contrast media. No major venous abnormalities are seen. Mediastinum/Nodes: No mediastinal fluid or gas. Normal thyroid gland and thoracic inlet. No acute abnormality of the trachea or esophagus. No worrisome mediastinal or axillary adenopathy.Few prominent paratracheal nodes are present but not significantly changed from prior including a 9 mm lymph node with preserved fatty hilum (3/20). Hilar nodal evaluation is limited in the absence of intravenous contrast media. Lungs/Pleura: There is diffuse mild airways thickening. Mixed ground-glass, consolidation and tree-in-bud nodularity is present predominantly in the right lower lobe and posterior segment right upper lobe to a lesser extent. Some mild interlobular septal and fissural thickening is noted as well with trace  bilateral effusions. Central pulmonary vascular congestion is present with redistributed pulmonary vascularity. Additional bandlike areas of opacity may reflect atelectatic change or scarring. No pneumothorax. Few scattered calcified granulomata. No concerning pulmonary nodules or masses within  the limitations of parenchymal disease though follow-up to resolution should be considered. Upper Abdomen: Patient appears to be post cholecystectomy. Interposition of the hepatic flexure anterior to the liver. Stable lobular thickening of the left adrenal gland. Musculoskeletal: Multilevel degenerative changes are present in the imaged portions of the spine. Some sclerotic likely Modic type endplate changes noted throughout the mid to lower thoracic levels. No acute or concerning osseous lesions. No worrisome chest wall masses or lesions. Mild body wall edema is noted. IMPRESSION: 1. Mixed ground-glass, consolidation and tree-in-bud nodularity predominantly in the right lower lobe and to a lesser extent the posterior segment right upper lobe. Findings could reflect an acute multifocal infection or inflammatory process or sequela of aspiration. 2. Additional superimposed features of septal thickening and vascular redistribution/congestion with trace effusions may suggest some superimposed edematous change as well. 3. Mild body wall edema. 4. Aortic Atherosclerosis (ICD10-I70.0). 5. Coronary artery calcifications are present. Please note that the presence of coronary artery calcium documents the presence of coronary artery disease, the severity of this disease and any potential stenosis cannot be assessed on this non-gated CT examination. Electronically Signed   By: Kreg ShropshirePrice  DeHay M.D.   On: 01/31/2020 23:56   MR BRAIN WO CONTRAST  Result Date: 02/08/2020 CLINICAL DATA:  Initial evaluation for acute double vision, left-sided weakness, speech difficulty. EXAM: MRI HEAD WITHOUT CONTRAST TECHNIQUE: Multiplanar, multiecho pulse  sequences of the brain and surrounding structures were obtained without intravenous contrast. COMPARISON:  Prior CTs from 02/07/2020. FINDINGS: Brain: Examination degraded by motion artifact. Cerebral volume within normal limits for age. Few scattered foci of T2/FLAIR hyperintensity noted involving the supratentorial cerebral white matter, nonspecific, but felt to be within normal limits for age and of doubtful significance. No abnormal foci of restricted diffusion to suggest acute or subacute ischemia. Gray-white matter differentiation maintained. No encephalomalacia to suggest chronic cortical infarction. No foci of susceptibility artifact to suggest acute or chronic intracranial hemorrhage. No mass lesion, midline shift or mass effect. No hydrocephalus or extra-axial fluid collection. Incidental note made of an empty sella. Midline structures intact. Vascular: Major intracranial vascular flow voids are maintained. Skull and upper cervical spine: Craniocervical junction within normal limits. No visible focal marrow replacing lesion. No scalp soft tissue abnormality. Sinuses/Orbits: Globes and orbital soft tissues within normal limits. Mild scattered mucosal thickening noted within the ethmoidal air cells. Paranasal sinuses are otherwise clear. Small right greater than left mastoid effusions, of doubtful significance. Inner ear structures grossly normal. Visualized nasopharynx within normal limits. Other: None. IMPRESSION: 1. No acute intracranial abnormality. 2. Empty sella. While this finding is often incidental in nature and of no clinical significance, this can also be seen in the setting of idiopathic intracranial hypertension. 3. Otherwise unremarkable and normal brain MRI for age. Electronically Signed   By: Rise MuBenjamin  McClintock M.D.   On: 02/08/2020 02:39   DG CHEST PORT 1 VIEW  Result Date: 02/08/2020 CLINICAL DATA:  Code stroke patient. Double vision, left-sided weakness and difficulty getting words  out. History of asthma, hypertension, bronchitis, COPD. EXAM: PORTABLE CHEST 1 VIEW COMPARISON:  01/30/2020 FINDINGS: Shallow inspiration. Heart size and pulmonary vascularity are normal. Mild infiltrates in the lung bases with improvement since previous study. No pleural effusions. No pneumothorax. Mediastinal contours appear intact. IMPRESSION: Mild infiltrates in the lung bases with improvement since previous study. Electronically Signed   By: Burman NievesWilliam  Stevens M.D.   On: 02/08/2020 00:31   DG Chest Portable 1 View  Result Date: 01/30/2020 CLINICAL DATA:  Shortness of breath.  Potential COVID. EXAM: PORTABLE CHEST 1 VIEW COMPARISON:  05/30/2019. FINDINGS: Borderline cardiomegaly. No pulmonary venous congestion. Diffuse prominent bilateral interstitial prominence itis. No pleural effusion or pneumothorax. IMPRESSION: 1. Diffuse prominent bilateral interstitial prominence consistent with pneumonitis. 2.  Borderline cardiomegaly.  No pulmonary venous congestion. Electronically Signed   By: Maisie Fus  Register   On: 01/30/2020 11:09   DG Swallowing Func-Speech Pathology  Result Date: 02/02/2020 Objective Swallowing Evaluation: Type of Study: MBS-Modified Barium Swallow Study  Patient Details Name: TYAUNA LACAZE MRN: [id number] Date of Birth: 1961-04-18 Today's Date: 02/02/2020 Time: SLP Start Time (ACUTE ONLY): 0949 -SLP Stop Time (ACUTE ONLY): 1003 SLP Time Calculation (min) (ACUTE ONLY): 14 min Past Medical History: Past Medical History: Diagnosis Date . Acute pancreatitis  . Asthma   main dyspnea thought due to deconditioning (10/2013) . Bipolar I disorder, most recent episode (or current) manic, unspecified   pt denies this . Bronchitis, chronic obstructive (HCC) 2008 & 2009  FeV1 64% TLC 105% DLCO 55% 2008  -  FeV1 81% FeF 25-75 43% 2009 . COPD (chronic obstructive pulmonary disease) (HCC)   emphysema, not an issue per pulm (10/2013) . History of pyelonephritis 05/2012 . HTN (hypertension)  . Hx of migraines  .  Knee pain, right  . Leukocytosis, unspecified  . Lumbar disc disease with radiculopathy   multilevel spondylitic changes, scoliosis, and anterolisthesis L4 not thought to currently be surg candidate (Dr. Phoebe Perch, [facility]) (Dr. Ollen Bowl, [place]) . Nicotine addiction  . Nonspecific abnormal electrocardiogram (ECG) (EKG)  . Obesity  . Pure hyperglyceridemia  . Suicide attempt (HCC) 06/2001 . Swelling of limb  . Urge and stress incontinence 07/2012  ([facility]) Past Surgical History: Past Surgical History: Procedure Laterality Date . CHOLECYSTECTOMY  07/1982 . CT MAXILLOFACIAL WO/W CM  06/14/06  Left max mucocele . CT of chest  06/14/2006  Normal except c/w active inflammation / infection.  Left renal atrophy . ERCP / sphincterotomy - stenosis  01/15/06 . lumbar MRI  11/2010  multilevel spondylitic changes with upper lumbar scoliosis and anterolisthesis of L4 on 5 with some L foraminal narrowing esp at L/3 with concavity of scoliosis, some central stenosis and biforaminal narrowing at L4/5 with spondylolisthesis . [place] - Pneumonia, acute asthma, left maxillary sinusitis  01/11 - 06/18/2006 . Retained stone    ERCP secondary to retained stone . Right knee surgery  1984,1989,1989,1991,1994  Reconstructions for ACL insuff . TUBAL LIGATION   HPI: Pt is a 59 y.o. female with medical history significant of chronic hypoxic respiratory failure on 2 to 3 L baseline O2 and CPAP at bedtime, COPD Gold stage III (FEV1 44%, improved with bronchodilator to 50%, 2019), chronic cigarette smoker 5-10 cigarettes a day hypertension, OSA on CPAP at bedtime, IIDM, diabetic neuropathy, chronic pain, depression/anxiety, morbid obesity who presented to the ED with increasing short of breath. Rapid response on 9/1 due to respiratory distress and was placed on non rebreather mask. CT chest 9/1: Mixed ground-glass, consolidation and tree-in-bud nodularity predominantly in the right lower lobe and to a lesser extent the posterior segment right  upper lobe. Findings could reflect an acute multifocal infection or inflammatory process or sequela of aspiration. Pt reported "getting choked on food often" to Dr. Katrinka Blazing.  No data recorded Assessment / Plan / Recommendation CHL IP CLINICAL IMPRESSIONS 02/02/2020 Clinical Impression Pt's oropharyngeal swallow mechanism was within functional limits with transient penetration (PAS 2) of thin liquids when consecutive swallows were used. Esophageal screening revealed backflow of material  from the lower to upper thoracic esophagus and mild esophageal stasis. Backflow to the pharynx was not observed while the fluoro was on. However, pt demonstrated subsequent coughing and the possibility of aspiration secondary to backflowed material reaching the cervical esophagus and pharynx is suspected. Considering the results of the esophageal screening combined with her reported symptoms, assessment (e.g., esophagram) of the esophageal phase of her swallow is recommended. Pt was educated regarding results and recommendations as well as strategies to reduce aspiration risk during episodes of dyspnea. She verbalized understanding. Further skilled SLP services are not clinically indicated at this time.  SLP Visit Diagnosis Dysphagia, unspecified (R13.10) Attention and concentration deficit following -- Frontal lobe and executive function deficit following -- Impact on safety and function Mild aspiration risk   CHL IP TREATMENT RECOMMENDATION 02/02/2020 Treatment Recommendations No treatment recommended at this time   Prognosis 02/02/2020 Prognosis for Safe Diet Advancement Good Barriers to Reach Goals -- Barriers/Prognosis Comment -- [facility] IP DIET RECOMMENDATION 02/02/2020 SLP Diet Recommendations Regular solids;Thin liquid Liquid Administration via Cup;Straw Medication Administration Whole meds with liquid Compensations Slow rate;Small sips/bites Postural Changes --   No flowsheet data found.  CHL IP FOLLOW UP RECOMMENDATIONS 02/02/2020 Follow up  Recommendations None   CHL IP FREQUENCY AND DURATION 02/02/2020 Speech Therapy Frequency (ACUTE ONLY) min 2x/week Treatment Duration 1 week      CHL IP ORAL PHASE 02/02/2020 Oral Phase WFL Oral - Pudding Teaspoon -- Oral - Pudding Cup -- Oral - Honey Teaspoon -- Oral - Honey Cup -- Oral - Nectar Teaspoon -- Oral - Nectar Cup -- Oral - Nectar Straw -- Oral - Thin Teaspoon -- Oral - Thin Cup -- Oral - Thin Straw -- Oral - Puree -- Oral - Mech Soft -- Oral - Regular -- Oral - Multi-Consistency -- Oral - Pill -- Oral Phase - Comment --  CHL IP PHARYNGEAL PHASE 02/02/2020 Pharyngeal Phase WFL Pharyngeal- Pudding Teaspoon -- Pharyngeal -- Pharyngeal- Pudding Cup -- Pharyngeal -- Pharyngeal- Honey Teaspoon -- Pharyngeal -- Pharyngeal- Honey Cup -- Pharyngeal -- Pharyngeal- Nectar Teaspoon -- Pharyngeal -- Pharyngeal- Nectar Cup -- Pharyngeal -- Pharyngeal- Nectar Straw -- Pharyngeal -- Pharyngeal- Thin Teaspoon -- Pharyngeal -- Pharyngeal- Thin Cup -- Pharyngeal -- Pharyngeal- Thin Straw Penetration/Aspiration during swallow Pharyngeal Material enters airway, remains ABOVE vocal cords then ejected out Pharyngeal- Puree -- Pharyngeal -- Pharyngeal- Mechanical Soft -- Pharyngeal -- Pharyngeal- Regular -- Pharyngeal -- Pharyngeal- Multi-consistency -- Pharyngeal -- Pharyngeal- Pill -- Pharyngeal -- Pharyngeal Comment --  CHL IP CERVICAL ESOPHAGEAL PHASE 02/02/2020 Cervical Esophageal Phase (No Data) Pudding Teaspoon -- Pudding Cup -- Honey Teaspoon -- Honey Cup -- Nectar Teaspoon -- Nectar Cup -- Nectar Straw -- Thin Teaspoon -- Thin Cup -- Thin Straw -- Puree -- Mechanical Soft -- Regular -- Multi-consistency -- Pill -- Cervical Esophageal Comment -- Shanika I. Vear Clock, MS, CCC-SLP Acute Rehabilitation Services Office number [phone] Pager [phone] Scheryl Marten 02/02/2020, 10:28 AM              ECHOCARDIOGRAM COMPLETE  Result Date: 01/31/2020    ECHOCARDIOGRAM REPORT   Patient Name:   ELLIETT GUARISCO Date of Exam:  01/31/2020 Medical Rec #:  [id number]     Height:       61.0 in Accession #:    [id number]    Weight:       216.0 lb Date of Birth:  December 17, 1960     BSA:          1.952 m Patient Age:  59 years      BP:           102/66 mmHg Patient Gender: F             HR:           71 bpm. Exam Location:  Inpatient Procedure: 2D Echo Indications:    dyspnea  History:        Patient has no prior history of Echocardiogram examinations.                 COPD; Risk Factors:Hypertension and Current Smoker.  Sonographer:    Celene Skeen RDCS (AE) Referring Phys: [id number] [facility]  Sonographer Comments: Image acquisition challenging due to patient body habitus, Image acquisition challenging due to respiratory motion and Image acquisition challenging due to COPD. IMPRESSIONS  1. Technically difficult study, even with Definity contrast. There is no left ventricular thrombus. There is relatively preserved systolic function in the basal segments. Findings suggest stress cardiomyopathy (takotsubo sd.), but could well represent multivessel CAD. Left ventricular ejection fraction, by estimation, is 30 to 35%. The left ventricle has moderate to severely decreased function. The left ventricle demonstrates global hypokinesis. The left ventricular internal cavity size was mildly dilated. Left ventricular diastolic parameters are consistent with Grade II diastolic dysfunction (pseudonormalization). Elevated left atrial pressure.  2. Right ventricular systolic function was not well visualized. The right ventricular size is normal. Tricuspid regurgitation signal is inadequate for assessing PA pressure.  3. The mitral valve is grossly normal. No evidence of mitral valve regurgitation. No evidence of mitral stenosis.  4. The aortic valve is grossly normal. Aortic valve regurgitation is not visualized. No aortic stenosis is present. Comparison(s): Prior images reviewed side by side. The left ventricular function is significantly worse. The left  ventricular wall motion abnormalities are new. Consider serial studies, with Definity. FINDINGS  Left Ventricle: Technically difficult study, even with Definity contrast. There is no left ventricular thrombus. There is relatively preserved systolic function in the basal segments. Findings suggest stress cardiomyopathy (takotsubo sd.), but could well represent multivessel CAD. Left ventricular ejection fraction, by estimation, is 30 to 35%. The left ventricle has moderate to severely decreased function. The left ventricle demonstrates global hypokinesis. Definity contrast agent was given IV to delineate the left ventricular endocardial borders. The left ventricular internal cavity size was mildly dilated. There is no left ventricular hypertrophy. Left ventricular diastolic parameters are consistent with Grade II diastolic dysfunction (pseudonormalization). Elevated left atrial pressure. Right Ventricle: The right ventricular size is normal. No increase in right ventricular wall thickness. Right ventricular systolic function was not well visualized. Tricuspid regurgitation signal is inadequate for assessing PA pressure. Left Atrium: Left atrial size was normal in size. Right Atrium: Right atrial size was normal in size. Pericardium: There is no evidence of pericardial effusion. Mitral Valve: The mitral valve is grossly normal. No evidence of mitral valve regurgitation. No evidence of mitral valve stenosis. Tricuspid Valve: The tricuspid valve is normal in structure. Tricuspid valve regurgitation is not demonstrated. Aortic Valve: The aortic valve is grossly normal. Aortic valve regurgitation is not visualized. No aortic stenosis is present. Pulmonic Valve: The pulmonic valve was not well visualized. Pulmonic valve regurgitation is not visualized. Aorta: The aortic root is normal in size and structure. IAS/Shunts: No atrial level shunt detected by color flow Doppler.  LEFT VENTRICLE PLAX 2D LVIDd:         5.50 cm   Diastology LVIDs:  4.20 cm  LV e' lateral:   7.51 cm/s LV PW:         0.90 cm  LV E/e' lateral: 11.5 LV IVS:        1.10 cm LVOT diam:     2.40 cm LV SV:         80 LV SV Index:   41 LVOT Area:     4.52 cm  LEFT ATRIUM           Index LA diam:      3.70 cm 1.90 cm/m LA Vol (A4C): 47.7 ml 24.44 ml/m  AORTIC VALVE LVOT Vmax:   96.60 cm/s LVOT Vmean:  70.600 cm/s LVOT VTI:    0.176 m  AORTA Ao Root diam: 2.70 cm MITRAL VALVE MV Area (PHT): 3.54 cm    SHUNTS MV Decel Time: 214 msec    Systemic VTI:  0.18 m MV E velocity: 86.50 cm/s  Systemic Diam: 2.40 cm MV A velocity: 72.00 cm/s MV E/A ratio:  1.20 Mihai Croitoru MD Electronically signed by Thurmon Fair MD Signature Date/Time: 01/31/2020/4:22:37 PM    Final    CT HEAD CODE STROKE WO CONTRAST  Result Date: 02/07/2020 CLINICAL DATA:  Code stroke.  Left-sided weakness EXAM: CT HEAD WITHOUT CONTRAST TECHNIQUE: Contiguous axial images were obtained from the base of the skull through the vertex without intravenous contrast. COMPARISON:  None. FINDINGS: Brain: There is no acute intracranial hemorrhage, mass effect, edema, or loss of gray-white differentiation. Ventricles and sulci are normal in size and configuration. There is no extra-axial collection. Vascular: No hyperdense vessel. Atherosclerotic calcification is present at the skull base. Skull: Unremarkable. Sinuses/Orbits: Patchy ethmoid opacification. Orbits are unremarkable. Other: Mastoid air cells are clear. ASPECTS ([place] Stroke Program Early CT Score) - Ganglionic level infarction (caudate, lentiform nuclei, internal capsule, insula, M1-M3 cortex): 7 - Supraganglionic infarction (M4-M6 cortex): 3 Total score (0-10 with 10 being normal): 10 IMPRESSION: No acute intracranial hemorrhage or evidence of acute infarction. ASPECT score is 10. These results were communicated to Dr. Iver Nestle at 9:10 pm on 02/07/2020 by text page via the [facility] messaging system. Electronically Signed   By: Guadlupe Spanish M.D.    On: 02/07/2020 21:13   US Abdomen Limited RUQ  Result Date: 01/30/2020 CLINICAL DATA:  Elevated liver enzymes. EXAM: ULTRASOUND ABDOMEN LIMITED RIGHT UPPER QUADRANT COMPARISON:  01/11/2006 abdominal ultrasound. FINDINGS: Gallbladder: Surgically absent. Common bile duct: Diameter: 2.4 mm Liver: No focal lesion identified. Increased parenchymal echogenicity. Portal vein is patent on color Doppler imaging with normal direction of blood flow towards the liver. Other: None. IMPRESSION: Hepatic steatosis.  No focal hepatic lesions. Sequela of cholecystectomy. Electronically Signed   By: Stana Bunting M.D.   On: 01/30/2020 16:50   DG ESOPHAGUS W SINGLE CM (SOL OR THIN BA)  Result Date: 02/02/2020 CLINICAL DATA:  Shortness of breath weakness. EXAM: ESOPHOGRAM/BARIUM SWALLOW TECHNIQUE: Single contrast examination was performed using  thin barium. FLUOROSCOPY TIME:  Fluoroscopy Time:  1 minutes 42 seconds Radiation Exposure Index (if provided by the fluoroscopic device): 14.3 mGy Number of Acquired Spot Images: 0 COMPARISON:  Chest CT of 01/31/2020 FINDINGS: Esophagus grossly normal. Limited assessment in semi recumbent positioning as the patient could not stand for the evaluation. Distensibility is normal. Mild deviation near the cardiac silhouette likely due to LEFT atrial enlargement based on comparison with recent CT. Motility could not be fully assessed as the patient was unable to lie in the prone RAO position due to current dyspnea. However,  on the last swallow obtained there was proximal escape of the bolus with head of bed slightly elevated in moderate tertiary peristaltic activity compatible with esophageal dysmotility. IMPRESSION: 1. Limited study without abnormality aside from signs of mild-to-moderate dysmotility. If there are continued symptoms that would suggest esophageal pathology a study as an outpatient could be performed for more complete assessment when the patient has recovered from this  episode of acute illness. Electronically Signed   By: Donzetta Kohut M.D.   On: 02/02/2020 13:39    Labs:  CBC: Recent Labs    02/02/20 1446 02/02/20 1446 02/03/20 1919 02/04/20 0645 02/07/20 2053 02/07/20 2055  WBC 12.0*  --  10.9* 16.8* 18.0*  --   HGB 12.7   < > 12.9 13.1 14.4 15.3*  HCT 40.2   < > 39.9 40.5 44.6 45.0  PLT 140*  --  183 192 277  --    < > = values in this interval not displayed.    COAGS: Recent Labs    02/07/20 2053  INR 1.0  APTT 27    BMP: Recent Labs    02/02/20 1446 02/02/20 1446 02/03/20 1919 02/04/20 0645 02/07/20 2053 02/07/20 2055  NA 139   < > 138 139 138 137  K 5.2*   < > 5.2* 4.6 4.4 4.2  CL 101   < > 103 102 98 100  CO2 25  --  25 27 28   --   GLUCOSE 151*   < > 190* 126* 138* 139*  BUN 25*   < > 24* 24* 21 24*  CALCIUM 9.2  --  9.2 9.3 9.5  --   CREATININE 1.16*   < > 1.10* 1.11* 1.38* 1.50*  GFRNONAA 51*  --  54* 54* 41*  --   GFRAA 59*  --  >60 >60 48*  --    < > = values in this interval not displayed.    LIVER FUNCTION TESTS: Recent Labs    02/02/20 1446 02/03/20 1919 02/04/20 0645 02/07/20 2053  BILITOT 0.9 0.5 0.7 0.5  AST 166* 57* 35 23  ALT 924* 561* 457* 126*  ALKPHOS 86 87 76 88  PROT 6.6 6.5 6.5 7.0  ALBUMIN 3.3* 3.2* 3.1* 3.5    TUMOR MARKERS: No results for input(s): AFPTM, CEA, CA199, CHROMGRNA in the last 8760 hours.  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  IR consulted for diagnostic angiogram by Dr. 2054.  Patient assessed at bedside alongside Dr. Roda Shutters.  She reports her neuro symptoms have resolved. She recently started baby aspirin at home after recent discharge for COPD exacerbation.  She is agreeable to diagnostic angiogram.  She understands no intervention planned today and that this will be discussed based on the results of the study.   She is NPO.   Risks and benefits of cerebral angiogram were discussed with the patient including, but not limited to bleeding, infection, vascular injury or contrast induced renal failure.  This interventional procedure involves the use of X-rays and because of the nature of the planned procedure, it is possible that we will have prolonged use of X-ray fluoroscopy.  Potential radiation risks to you include (but are not limited to) the following: - A slightly elevated risk for cancer  several years later in life. This risk is typically less than 0.5% percent. This risk is low in  comparison to the normal incidence of human cancer, which is 33% for women and 50% for men according to the [employer]. - Radiation induced injury can include skin redness, resembling a rash, tissue breakdown / ulcers and hair loss (which can be temporary or permanent).   The likelihood of either of these occurring depends on the difficulty of the procedure and whether you are sensitive to radiation due to previous procedures, disease, or genetic conditions.   IF your procedure requires a prolonged use of radiation, you will be notified and given written instructions for further action.  It is your responsibility to monitor the irradiated area for the 2 weeks following the procedure and to notify your physician if you are concerned that you have suffered a radiation induced injury.    All of the patient's questions were answered, patient is agreeable to proceed.  Consent signed and in chart.    Thank you for this interesting consult.  I greatly enjoyed meeting Brenda [name] and look forward to participating in their care.  A copy of this report was sent to the requesting provider on this date.  Electronically Signed: Hoyt Koch, PA 02/08/2020, 10:52 AM   I spent a total of 40 Minutes    in face to face in clinical consultation, greater than 50% of which was counseling/coordinating care  for TIA.

## 2020-02-08 NOTE — H&P (Signed)
History and Physical    Brenda [name] Brenda [name] DOB: March 27, 1961 DOA: 02/07/2020  PCP: Ria Bush, MD   Patient coming from: Home   Chief Complaint: Left-side weakness, slurred speech, N/V   HPI: Brenda [name] is a 59 y.o. female with medical history significant for COPD, chronic hypoxic respiratory failure, chronic combined CHF, OSA on CPAP, type 2 diabetes mellitus, BMI 7, chronic kidney disease 3a, and recent hospitalization with acute on chronic respiratory failure and non-STEMI, now presenting to the emergency department with left-sided weakness, slurred speech, and nausea with vomiting.  Patient reports that she had been doing well at home earlier today and until approximately 7:30 PM when she was watching TV and developed diplopia and weakness involving her left arm and leg.  She reportedly had some slurred speech at this time as well.  Symptoms improved en route to the emergency department before worsening again.  She reports a mild frontal headache that has been present since the recent hospitalization and which she attributes to chronic sinusitis.  She has not experienced any chest pain or palpitations, denies fevers or chills, reports that her chronic cough is at baseline, denies any increased dyspnea, and denies dysuria, flank pain, or abdominal pain.  ED Course: Upon arrival to the ED, patient is found to be afebrile, saturating mid 90s on 3 L/min of supplemental oxygen, and with stable blood pressure and normal heart rate.  EKG features sinus rhythm.  Noncontrast head CT negative for acute hemorrhage or infarction.  CTA head and neck was also performed with no emergent LVO.  Chemistry panel is notable for ALT of 126 and creatinine 1.38.  CBC features a leukocytosis to 18,000.  COVID-19 screening test not yet resulted.  Neurology was consulted, patient was treated with 324 mg of aspirin and 300 mg of Plavix, and hospitalists asked to admit.  Review of Systems:  All other  systems reviewed and apart from HPI, are negative.  Past Medical History:  Diagnosis Date  . Acute pancreatitis   . Asthma    main dyspnea thought due to deconditioning (10/2013)  . Bipolar I disorder, most recent episode (or current) manic, unspecified    pt denies this  . Bronchitis, chronic obstructive ([place]) 2008 & 2009   FeV1 64% TLC 105% DLCO 55% 2008  -  FeV1 81% FeF 25-75 43% 2009  . COPD (chronic obstructive pulmonary disease) (HCC)    emphysema, not an issue per pulm (10/2013)  . History of pyelonephritis 05/2012  . HTN (hypertension)   . Hx of migraines   . Knee pain, right   . Leukocytosis, unspecified   . Lumbar disc disease with radiculopathy    multilevel spondylitic changes, scoliosis, and anterolisthesis L4 not thought to currently be surg candidate (Dr. Luiz Ochoa, [place]) (Dr. Maryjean Ka, [place])  . Nicotine addiction   . Nonspecific abnormal electrocardiogram (ECG) (EKG)   . Obesity   . Pure hyperglyceridemia   . RSV (respiratory syncytial virus pneumonia) 01/2020   hospitalization  . Suicide attempt ([place]) 06/2001  . Swelling of limb   . Urge and stress incontinence 07/2012   ([place])    Past Surgical History:  Procedure Laterality Date  . CHOLECYSTECTOMY  07/1982  . CT MAXILLOFACIAL WO/W CM  06/14/06   Left max mucocele  . CT of chest  06/14/2006   Normal except c/w active inflammation / infection.  Left renal atrophy  . ERCP / sphincterotomy - stenosis  01/15/06  . lumbar MRI  11/2010  multilevel spondylitic changes with upper lumbar scoliosis and anterolisthesis of L4 on 5 with some L foraminal narrowing esp at L/3 with concavity of scoliosis, some central stenosis and biforaminal narrowing at L4/5 with spondylolisthesis  . [place] - Pneumonia, acute asthma, left maxillary sinusitis  01/11 - 06/18/2006  . Retained stone     ERCP secondary to retained stone  . Right knee surgery  1984,1989,1989,1991,1994   Reconstructions for ACL insuff  . TUBAL LIGATION       Social History:   reports that she has been smoking cigarettes. She has smoked for the past 41.00 years. She has never used smokeless tobacco. She reports current alcohol use. She reports that she does not use drugs.  Allergies  Allergen Reactions  . Metolazone Other (See Comments)    Metabolic mood swings    Family History  Problem Relation Age of Onset  . Heart disease Brother        MI in early 64's  . Hypertension Mother   . Hyperlipidemia Mother   . Heart disease Mother   . Hypertension Father   . Asthma Father   . Breast cancer Paternal Grandmother      Prior to Admission medications   Medication Sig Start Date End Date Taking? Authorizing Provider  albuterol (PROVENTIL) (2.5 MG/3ML) 0.083% nebulizer solution INHALE 1 VIAL VIA NEBULIZER EVERY 6 HOURS AS NEEDED FOR WHEEZING/SHORTNESS OF BREATH Patient taking differently: Take 2.5 mg by nebulization every 6 (six) hours as needed for wheezing or shortness of breath.  05/11/18   Ria Bush, MD  aspirin EC 81 MG EC tablet Take 1 tablet (81 mg total) by mouth daily. Swallow whole. 02/05/20   Mariel Aloe, MD  atorvastatin (LIPITOR) 20 MG tablet Take 20 mg by mouth at bedtime. 01/01/20   [provider]  benzonatate (TESSALON) 200 MG capsule Take 1 capsule (200 mg total) by mouth 3 (three) times daily as needed for cough. Swallow whole, to not bite pill 04/22/18   Parrett, Fonnie Mu, NP  Budeson-Glycopyrrol-Formoterol (BREZTRI AEROSPHERE) 160-9-4.8 MCG/ACT AERO Inhale 2 puffs into the lungs 2 (two) times daily. 05/30/19   Tanda Rockers, MD  dextromethorphan-guaiFENesin (MUCINEX DM) 30-600 MG 12hr tablet Take 1 tablet by mouth 2 (two) times daily as needed for cough.    [provider]  escitalopram (LEXAPRO) 20 MG tablet TAKE 1 AND 1/2 TABLETS DAILY BY MOUTH Patient taking differently: Take 30 mg by mouth daily.  01/04/20   Ria Bush, MD  famotidine (PEPCID) 20 MG tablet Take 20 mg by mouth at  bedtime. 12/15/19   [provider]  feeding supplement, GLUCERNA SHAKE, (GLUCERNA SHAKE) LIQD Take 237 mLs by mouth 2 (two) times daily between meals. 02/04/20   Mariel Aloe, MD  fluticasone (FLONASE) 50 MCG/ACT nasal spray Place 1 spray into both nostrils 2 (two) times daily. 05/30/19   Tanda Rockers, MD  furosemide (LASIX) 20 MG tablet Take 1 tablet (20 mg total) by mouth daily. 02/04/20   Mariel Aloe, MD  gabapentin (NEURONTIN) 300 MG capsule TAKE 2 TABLETS IN THE MORNING AND 3 TABLETS AT NIGHT Patient taking differently: Take 600-900 mg by mouth See admin instructions. TAKE 2 TABLETS IN THE MORNING AND 3 TABLETS AT NIGHT 09/24/19   Ria Bush, MD  LORazepam (ATIVAN) 0.5 MG tablet TAKE 1/2 TO 1 TABLET BY MOUTH TWICE A DAY AS NEEDED FOR ANXIETY Patient taking differently: Take 0.25-0.5 mg by mouth 2 (two) times daily as needed  for anxiety.  01/17/20   Ria Bush, MD  losartan (COZAAR) 25 MG tablet Take 0.5 tablets (12.5 mg total) by mouth daily. 02/04/20 03/05/20  Mariel Aloe, MD  metFORMIN (GLUCOPHAGE) 500 MG tablet TAKE 1 TABLET BY MOUTH EVERYDAY AT BEDTIME Patient taking differently: Take 500 mg by mouth daily with breakfast.  10/02/19   Ria Bush, MD  metoprolol succinate (TOPROL-XL) 25 MG 24 hr tablet Take 0.5 tablets (12.5 mg total) by mouth daily. 02/05/20 03/06/20  Mariel Aloe, MD  Multiple Vitamin (MULTIVITAMIN WITH MINERALS) TABS tablet Take 1 tablet by mouth daily.    [provider]  nystatin (MYCOSTATIN) 100000 UNIT/ML suspension TAKE 1 TEASPOONFUL BY MOUTH 3 TIMES DAILY AS NEEDED FOR MOUTH SORES. Patient taking differently: Take 5 mLs by mouth 3 (three) times daily as needed (mouth sores).  04/22/18   Parrett, Fonnie Mu, NP  pantoprazole (PROTONIX) 40 MG tablet TAKE 1 TABLET BY MOUTH EVERY DAY 30 TO 60 MINUTES BEFORE THE FIRST MEAL OF THE DAY Patient taking differently: Take 40 mg by mouth daily.  06/10/18   Tanda Rockers, MD  PROAIR  RESPICLICK 470 (90 Base) MCG/ACT AEPB INHALE 2 PUFFS INTO THE LUNGS EVERY 6 HOURS AS NEEDED FOR DYSPNEA/WHEEZE Patient taking differently: Inhale 2 puffs into the lungs every 6 (six) hours as needed (wheezing or shortness of breath).  08/09/19   Ria Bush, MD  promethazine-dextromethorphan (PROMETHAZINE-DM) 6.25-15 MG/5ML syrup Take 5 mLs by mouth 4 (four) times daily as needed for cough. 09/15/19   Tanda Rockers, MD    Physical Exam: Vitals:   02/07/20 2228 02/07/20 2229 02/07/20 2231 02/07/20 2310  BP:   116/65   Pulse:  76 76 67  Resp:  '16 15 15  ' Temp:      TempSrc:      SpO2: 95% 98% 98% 98%    Constitutional: NAD, calm  Eyes: PERTLA, lids and conjunctivae normal ENMT: Mucous membranes are moist. Posterior pharynx clear of any exudate or lesions.   Neck: normal, supple, no masses, no thyromegaly Respiratory:  no wheezing, no crackles. No accessory muscle use.  Cardiovascular: S1 & S2 heard, regular rate and rhythm. No extremity edema.  Abdomen: No distension, no tenderness, soft. Bowel sounds active.  Musculoskeletal: no clubbing / cyanosis. No joint deformity upper and lower extremities.   Skin: no significant rashes, lesions, ulcers. Warm, dry, well-perfused. Neurologic: CN 2-12 grossly intact. Sensation intact. Strength 5/5 in all 4 limbs.  Psychiatric: Alert and oriented to person, place, and situation. Pleasant and cooperative.    Labs and Imaging on Admission: I have personally reviewed following labs and imaging studies  CBC: Recent Labs  Lab 02/01/20 0352 02/01/20 0352 02/02/20 1446 02/03/20 1919 02/04/20 0645 02/07/20 2053 02/07/20 2055  WBC 19.7*  --  12.0* 10.9* 16.8* 18.0*  --   NEUTROABS 16.0*  --  10.0* 9.0* 10.4* 10.9*  --   HGB 13.5   < > 12.7 12.9 13.1 14.4 15.3*  HCT 41.2   < > 40.2 39.9 40.5 44.6 45.0  MCV 93.2  --  93.7 91.7 92.3 92.9  --   PLT 151  --  140* 183 192 277  --    < > = values in this interval not displayed.   Basic  Metabolic Panel: Recent Labs  Lab 02/01/20 0352 02/01/20 0352 02/02/20 1446 02/03/20 1919 02/04/20 0645 02/07/20 2053 02/07/20 2055  NA 138   < > 139 138 139 138 137  K 4.2   < >  5.2* 5.2* 4.6 4.4 4.2  CL 102   < > 101 103 102 98 100  CO2 25  --  '25 25 27 28  ' --   GLUCOSE 118*   < > 151* 190* 126* 138* 139*  BUN 35*   < > 25* 24* 24* 21 24*  CREATININE 1.49*   < > 1.16* 1.10* 1.11* 1.38* 1.50*  CALCIUM 9.1  --  9.2 9.2 9.3 9.5  --    < > = values in this interval not displayed.   GFR: Estimated Creatinine Clearance: 43.3 mL/min (A) (by C-G formula based on SCr of 1.5 mg/dL (H)). Liver Function Tests: Recent Labs  Lab 02/01/20 0352 02/02/20 1446 02/03/20 1919 02/04/20 0645 02/07/20 2053  AST 718* 166* 57* 35 23  ALT 1,471* 924* 561* 457* 126*  ALKPHOS 100 86 87 76 88  BILITOT 0.7 0.9 0.5 0.7 0.5  PROT 6.9 6.6 6.5 6.5 7.0  ALBUMIN 3.5 3.3* 3.2* 3.1* 3.5   No results for input(s): LIPASE, AMYLASE in the last 168 hours. No results for input(s): AMMONIA in the last 168 hours. Coagulation Profile: Recent Labs  Lab 02/07/20 2053  INR 1.0   Cardiac Enzymes: No results for input(s): CKTOTAL, CKMB, CKMBINDEX, TROPONINI in the last 168 hours. BNP (last 3 results) No results for input(s): PROBNP in the last 8760 hours. HbA1C: No results for input(s): HGBA1C in the last 72 hours. CBG: Recent Labs  Lab 02/03/20 1650 02/03/20 2038 02/04/20 0746 02/04/20 1133 02/07/20 2051  GLUCAP 223* 190* 124* 112* 157*   Lipid Profile: No results for input(s): CHOL, HDL, LDLCALC, TRIG, CHOLHDL, LDLDIRECT in the last 72 hours. Thyroid Function Tests: No results for input(s): TSH, T4TOTAL, FREET4, T3FREE, THYROIDAB in the last 72 hours. Anemia Panel: No results for input(s): VITAMINB12, FOLATE, FERRITIN, TIBC, IRON, RETICCTPCT in the last 72 hours. Urine analysis:    Component Value Date/Time   COLORURINE YELLOW 02/24/2010 0953   APPEARANCEUR CLEAR 02/24/2010 0953   LABSPEC  1.012 02/24/2010 0953   PHURINE 5.5 02/24/2010 0953   GLUCOSEU NEGATIVE 02/24/2010 0953   HGBUR NEGATIVE 02/24/2010 0953   HGBUR negative 02/22/2008 1545   BILIRUBINUR negative 01/12/2017 1150   KETONESUR NEGATIVE 02/24/2010 0953   PROTEINUR +- 01/12/2017 1150   PROTEINUR NEGATIVE 02/24/2010 0953   UROBILINOGEN 0.2 01/12/2017 1150   UROBILINOGEN 0.2 02/24/2010 0953   NITRITE negative 01/12/2017 1150   NITRITE NEGATIVE 02/24/2010 0953   LEUKOCYTESUR Small (1+) (A) 01/12/2017 1150   Sepsis Labs: '@LABRCNTIP' (procalcitonin:4,lacticidven:4) ) Recent Results (from the past 240 hour(s))  SARS Coronavirus 2 by RT PCR (hospital order, performed in [place] hospital lab) Nasopharyngeal Nasopharyngeal Swab     Status: None   Collection Time: 01/30/20  9:52 AM   Specimen: Nasopharyngeal Swab  Result Value Ref Range Status   SARS Coronavirus 2 NEGATIVE NEGATIVE Final    Comment: (NOTE) SARS-CoV-2 target nucleic acids are NOT DETECTED.  The SARS-CoV-2 RNA is generally detectable in upper and lower respiratory specimens during the acute phase of infection. The lowest concentration of SARS-CoV-2 viral copies this assay can detect is 250 copies / mL. A negative result does not preclude SARS-CoV-2 infection and should not be used as the sole basis for treatment or other patient management decisions.  A negative result may occur with improper specimen collection / handling, submission of specimen other than nasopharyngeal swab, presence of viral mutation(s) within the areas targeted by this assay, and inadequate number of viral copies (<250 copies /  mL). A negative result must be combined with clinical observations, patient history, and epidemiological information.  Fact Sheet for Patients:   StrictlyIdeas.no  Fact Sheet for Healthcare Providers: BankingDealers.co.za  This test is not yet approved or  cleared by the [place] FDA and has  been authorized for detection and/or diagnosis of SARS-CoV-2 by FDA under an Emergency Use Authorization (EUA).  This EUA will remain in effect (meaning this test can be used) for the duration of the COVID-19 declaration under Section 564(b)(1) of the Act, 21 U.S.C. section 360bbb-3(b)(1), unless the authorization is terminated or revoked sooner.  Performed at [facility], [place] [address], [place], [place] [address]   Expectorated sputum assessment w rflx to resp cult     Status: None   Collection Time: 01/31/20  4:48 AM   Specimen: Expectorated Sputum  Result Value Ref Range Status   Specimen Description EXPECTORATED SPUTUM  Final   Special Requests NONE  Final   Sputum evaluation   Final    Sputum specimen not acceptable for testing.  Please recollect.   RESULT CALLED TO, READ BACK BY AND VERIFIED WITH: C GOAD 01/31/20 0557 JDW Performed at [facility], [address] [place], [place] [address]    Report Status 01/31/2020 FINAL  Final  MRSA PCR Screening     Status: None   Collection Time: 01/31/20  8:46 PM   Specimen: Nasal Mucosa; Nasopharyngeal  Result Value Ref Range Status   MRSA by PCR NEGATIVE NEGATIVE Final    Comment:        The GeneXpert MRSA Assay (FDA approved for NASAL specimens only), is one component of a comprehensive MRSA colonization surveillance program. It is not intended to diagnose MRSA infection nor to guide or monitor treatment for MRSA infections. Performed at [facility], [place] [address] [place], [place] [address]   Respiratory Panel by PCR     Status: Abnormal   Collection Time: 02/02/20  8:57 PM   Specimen: Nasopharyngeal Swab; Respiratory  Result Value Ref Range Status   Adenovirus NOT DETECTED NOT DETECTED Final   Coronavirus 229E NOT DETECTED NOT DETECTED Final    Comment: (NOTE) The Coronavirus on the Respiratory Panel, DOES NOT test for the novel  Coronavirus (2019 nCoV)    Coronavirus HKU1 NOT DETECTED NOT  DETECTED Final   Coronavirus NL63 NOT DETECTED NOT DETECTED Final   Coronavirus OC43 NOT DETECTED NOT DETECTED Final   Metapneumovirus NOT DETECTED NOT DETECTED Final   Rhinovirus / Enterovirus NOT DETECTED NOT DETECTED Final   Influenza A NOT DETECTED NOT DETECTED Final   Influenza B NOT DETECTED NOT DETECTED Final   Parainfluenza Virus 1 NOT DETECTED NOT DETECTED Final   Parainfluenza Virus 2 NOT DETECTED NOT DETECTED Final   Parainfluenza Virus 3 NOT DETECTED NOT DETECTED Final   Parainfluenza Virus 4 NOT DETECTED NOT DETECTED Final   Respiratory Syncytial Virus DETECTED (A) NOT DETECTED Final   Bordetella pertussis NOT DETECTED NOT DETECTED Final   Chlamydophila pneumoniae NOT DETECTED NOT DETECTED Final   Mycoplasma pneumoniae NOT DETECTED NOT DETECTED Final    Comment: Performed at [facility], [place]. [address] [place], [place] [address]     Radiological Exams on Admission: CT Angio Head W or Wo Contrast  Result Date: 02/07/2020 CLINICAL DATA:  Left-sided weakness, code stroke follow-up EXAM: CT ANGIOGRAPHY HEAD AND NECK TECHNIQUE: Multidetector CT imaging of the head and neck was performed using the standard protocol during  bolus administration of intravenous contrast. Multiplanar CT image reconstructions and MIPs were obtained to evaluate the vascular anatomy. Carotid stenosis measurements (when applicable) are obtained utilizing NASCET criteria, using the distal internal carotid diameter as the denominator. CONTRAST:  54m OMNIPAQUE IOHEXOL 350 MG/ML SOLN COMPARISON:  None. FINDINGS: CTA NECK Aortic arch: Great vessel origins are patent with mild calcified plaque. Right carotid system: Patent. Patent mixed but primarily noncalcified plaque at the ICA origin causing less than 50% stenosis. Left carotid system: Patent. Mild calcified plaque at the ICA origin causing less than 50% stenosis. Vertebral arteries: Patent. Right vertebral artery is dominant. Left vertebral artery origin is  not well seen due to artifact. Skeleton: Multilevel degenerative changes of the cervical spine. Other neck: No mass or adenopathy. Upper chest: No apical lung mass. Review of the MIP images confirms the above findings CTA HEAD Anterior circulation: Intracranial internal carotid arteries are patent. Posterior circulation: Intracranial right vertebral artery is patent. Patent right PICA origin. Intracranial left vertebral artery becomes diminutive with high-grade stenosis or occlusion distally. Basilar artery is patent. Small left AICA appears to be present. There is high-grade stenosis near the superior cerebellar origins. The superior cerebellar artery origins are poorly opacified with reconstitution. Bilateral posterior communicating arteries are present. There is fetal origin of the right PCA. Venous sinuses: Patent as allowed by contrast bolus timing. Review of the MIP images confirms the above findings IMPRESSION: No large vessel occlusion. Plaque at the ICA origins causes less than 50% stenosis. Small caliber nondominant left vertebral artery becomes diminutive intracranially with high-grade stenosis or occlusion distally. A left PICA origin is not identified and may be congenitally absent. A small left AICA appears to be present. Focal high-grade stenosis of the basilar artery at the superior cerebellar artery origins. These results were called by telephone at the time of interpretation on 02/07/2020 at 9:39 pm to provider Dr. BCurly Shores who verbally acknowledged these results. Electronically Signed   By: PMacy MisM.D.   On: 02/07/2020 21:41   CT Angio Neck W and/or Wo Contrast  Result Date: 02/07/2020 CLINICAL DATA:  Left-sided weakness, code stroke follow-up EXAM: CT ANGIOGRAPHY HEAD AND NECK TECHNIQUE: Multidetector CT imaging of the head and neck was performed using the standard protocol during bolus administration of intravenous contrast. Multiplanar CT image reconstructions and MIPs were obtained to  evaluate the vascular anatomy. Carotid stenosis measurements (when applicable) are obtained utilizing NASCET criteria, using the distal internal carotid diameter as the denominator. CONTRAST:  874mOMNIPAQUE IOHEXOL 350 MG/ML SOLN COMPARISON:  None. FINDINGS: CTA NECK Aortic arch: Great vessel origins are patent with mild calcified plaque. Right carotid system: Patent. Patent mixed but primarily noncalcified plaque at the ICA origin causing less than 50% stenosis. Left carotid system: Patent. Mild calcified plaque at the ICA origin causing less than 50% stenosis. Vertebral arteries: Patent. Right vertebral artery is dominant. Left vertebral artery origin is not well seen due to artifact. Skeleton: Multilevel degenerative changes of the cervical spine. Other neck: No mass or adenopathy. Upper chest: No apical lung mass. Review of the MIP images confirms the above findings CTA HEAD Anterior circulation: Intracranial internal carotid arteries are patent. Posterior circulation: Intracranial right vertebral artery is patent. Patent right PICA origin. Intracranial left vertebral artery becomes diminutive with high-grade stenosis or occlusion distally. Basilar artery is patent. Small left AICA appears to be present. There is high-grade stenosis near the superior cerebellar origins. The superior cerebellar artery origins are poorly opacified with reconstitution. Bilateral posterior communicating  arteries are present. There is fetal origin of the right PCA. Venous sinuses: Patent as allowed by contrast bolus timing. Review of the MIP images confirms the above findings IMPRESSION: No large vessel occlusion. Plaque at the ICA origins causes less than 50% stenosis. Small caliber nondominant left vertebral artery becomes diminutive intracranially with high-grade stenosis or occlusion distally. A left PICA origin is not identified and may be congenitally absent. A small left AICA appears to be present. Focal high-grade stenosis  of the basilar artery at the superior cerebellar artery origins. These results were called by telephone at the time of interpretation on 02/07/2020 at 9:39 pm to provider Dr. Curly Shores, who verbally acknowledged these results. Electronically Signed   By: Macy Mis M.D.   On: 02/07/2020 21:41   CT HEAD CODE STROKE WO CONTRAST  Result Date: 02/07/2020 CLINICAL DATA:  Code stroke.  Left-sided weakness EXAM: CT HEAD WITHOUT CONTRAST TECHNIQUE: Contiguous axial images were obtained from the base of the skull through the vertex without intravenous contrast. COMPARISON:  None. FINDINGS: Brain: There is no acute intracranial hemorrhage, mass effect, edema, or loss of gray-white differentiation. Ventricles and sulci are normal in size and configuration. There is no extra-axial collection. Vascular: No hyperdense vessel. Atherosclerotic calcification is present at the skull base. Skull: Unremarkable. Sinuses/Orbits: Patchy ethmoid opacification. Orbits are unremarkable. Other: Mastoid air cells are clear. ASPECTS ([place] Stroke Program Early CT Score) - Ganglionic level infarction (caudate, lentiform nuclei, internal capsule, insula, M1-M3 cortex): 7 - Supraganglionic infarction (M4-M6 cortex): 3 Total score (0-10 with 10 being normal): 10 IMPRESSION: No acute intracranial hemorrhage or evidence of acute infarction. ASPECT score is 10. These results were communicated to Dr. Curly Shores at 9:10 pm on 02/07/2020 by text page via the [facility] messaging system. Electronically Signed   By: Macy Mis M.D.   On: 02/07/2020 21:13    EKG: Independently reviewed. Sinus rhythm.   Assessment/Plan   1. TIA  - Presents with acute-onset of left-sided weakness, transient diplopia, and dysarthria that waxed and waned since onset ~19:30 and is resolved at time of admission  - No acute hemorrhage or infarction on head CT, no emergent LVO on CTA head & neck  - Neurology is consulting and much appreciated  - ASA 324 and Plavix 300 were  administered in ED  - Continue cardiac monitoring and neuro checks, check MRI brain, echocardiogram, TSH, RPR, ESR, and lipids, consult PT & OT, continue ASA 81 and Plavix 75 daily    2. COPD; chronic hypoxic respiratory failure  - Not in exacerbation, continues to improve since recent admission, stable on her usual supplemental O2, will continue inhalers and supplemental O2   3. Chronic combined systolic and diastolic CHF  - Appears compensated  - EF was 30-35% with grade II diastolic dysfunction and question of Takotsubo on recent echo  - Continue Lasix, hold ARB initially given increased creatinine    4. Type II DM  - Recent A1c 6.2%  - Check CBGs, use low-intensity SSI with Novolog for now    5. CKD IIIa  - SCr is 1.38 on admission, up from apparent baseline closer to 1.1  - Hold losartan initially, renally-dose medications   6. Leukocytosis  - WBC is 18,000 on admission without fever  - Appears to be chronic going back at least 2 yrs, CXR and respiratory sxs continue to improve from recent admission, she denies urinary sxs, abdominal exam benign, no meningismus, and no cellulitis or wounds noted  - Monitor, culture  if febrile    7. Elevated ALT  - ALT elevated to 126, continuing to improve from recent hospitalization where she had a negative viral hepatitis panel and [place] notable for hepatic steatosis  - Abdominal exam benign; statin had been held initially during recent admission but enzymes continue to downtrend back on it, will continue    8. OSA  - Continue CPAP qHS    DVT prophylaxis: Lovenox  Code Status: Full  Family Communication: Discussed with patient  Disposition Plan:  Patient is from: Home  Anticipated d/c is to: TBD Anticipated d/c date is: 02/09/20 Patient currently: Pending further TIA workup   Consults called: Neurology  Admission status: Observation     Vianne Bulls, MD [facility]  02/08/2020, 12:19 AM

## 2020-02-08 NOTE — ED Notes (Signed)
Per IR, they are ready for pt, but pt would have to come back to the ED d/t no inpatient beds available. IR reports that they were calling to assess if pt would be able to have q15 checks on their procedural site as well as releasing air pressure from pressure bandage at site. It was explained to IR by previous RN that we are not certified to perform releasing of the air for the pressure bandage. Patient placement notified of need for bed and per patient placement there aren't any beds available. Charge RN notified as well to express to patient placement the need for pt to have IR procedure and need for a bed. Per IR and Charge RN, pt cannot go to IR until pt has an inpatient bed. Notified attending provider. Will continue to monitor pt and notify provider of any changes in status.

## 2020-02-08 NOTE — Evaluation (Signed)
Occupational Therapy Evaluation Patient Details Name: Brenda [name] MRN: [id number] DOB: [birth year] Today's Date: 02/08/2020    History of Present Illness Brenda [name] is a 59 y.o. female with medical history significant for COPD, chronic hypoxic respiratory failure, chronic combined CHF, OSA on CPAP, type 2 diabetes mellitus, BMI 41, chronic kidney disease 3a, and recent hospitalization with acute on chronic respiratory failure and non-STEMI, now presenting to the emergency department with left-sided weakness, slurred speech, and nausea with vomiting.  Patient reports that she had been doing well at home until approximately 7:30 PM when she was watching TV and developed diplopia and weakness involving her left arm and leg. ED Course MRI - Upon arrival to the ED, patient is found to be afebrile, saturating mid 90s on 3 L/min of supplemental oxygen, and with stable blood pressure and normal heart rate.   Clinical Impression   Prior to admission, pt living with family and was completing ADLs independently. At time of eval, pt able to complete bed mobility and sit/stand at min guard level. Pt then completed functional mobility to bathroom in ED hallway with one person hand held assist. Pt currently performing ADLs at min guard level, requiring increased time and rest breaks to manage poor activity tolerance from baseline. Pt is on 3L [place] at all times due to baseline COPD. LUE testing is WFL. Given current status, recommend home health OT to improve independence with ADLs in home environment. OT will continue to follow per POC listed below.     Follow Up Recommendations  Home health OT    Equipment Recommendations  None recommended by OT    Recommendations for Other Services       Precautions / Restrictions Precautions Precautions: Fall Restrictions Weight Bearing Restrictions: No      Mobility Bed Mobility Overal bed mobility: Needs Assistance Bed Mobility: Supine to Sit;Sit to Supine      Supine to sit: Min guard Sit to supine: Min guard   General bed mobility comments: no physical assist required from stretcher  Transfers Overall transfer level: Needs assistance Equipment used: 1 person hand held assist Transfers: Sit to/from Stand Sit to Stand: Min guard         General transfer comment: Able to stand from stretcher and toilet    Balance Overall balance assessment: Needs assistance Sitting-balance support: No upper extremity supported;Feet supported Sitting balance-Leahy Scale: Normal     Standing balance support: Single extremity supported;During functional activity Standing balance-Leahy Scale: Fair Standing balance comment: Able to statically stand at sink; more stable w/one UE support                           ADL either performed or assessed with clinical judgement   ADL Overall ADL's : Needs assistance/impaired Eating/Feeding: Independent;Sitting   Grooming: Wash/dry hands;Standing;Supervision/safety Grooming Details (indicate cue type and reason): Stood at sink to wash hands Upper Body Bathing: Independent;Sitting   Lower Body Bathing: Sit to/from stand;Min guard   Upper Body Dressing : Sitting;Supervision/safety   Lower Body Dressing: Sit to/from stand;Min guard   Toilet Transfer: Ambulation;Regular Toilet;Grab bars;Min Pension scheme manager Details (indicate cue type and reason): Pt able to complete mobility to bathroom next door from pt room in ED Toileting- Clothing Manipulation and Hygiene: Sit to/from stand;Min guard   Tub/ Shower Transfer: Psychologist, counselling;Ambulation;Shower seat;Grab bars;Min guard   Functional mobility during ADLs: Min guard General ADL Comments: poor activity tolerance from baseline COPD  Vision Patient Visual Report: No change from baseline       Perception     Praxis      Pertinent Vitals/Pain Pain Assessment: No/denies pain     Hand Dominance Right   Extremity/Trunk Assessment Upper  Extremity Assessment Upper Extremity Assessment: LUE deficits/detail;Overall WFL for tasks assessed LUE Deficits / Details: FMC, coordination, proprioceptive testing WFL   Lower Extremity Assessment Lower Extremity Assessment: Generalized weakness       Communication Communication Communication: No difficulties   Cognition Arousal/Alertness: Awake/alert Behavior During Therapy: WFL for tasks assessed/performed Overall Cognitive Status: Within Functional Limits for tasks assessed                                 General Comments: A&O x 4, pleasant, reported feeling tired/fatigue due to poor/lack of sleep since admission to ED   General Comments       Exercises     Shoulder Instructions      Home Living Family/patient expects to be discharged to:: Private residence Living Arrangements: Children;Other relatives   Type of Home: House Home Access: Stairs to enter [employer] of Steps: 5 to deck + 1 additional step Entrance Stairs-Rails: Right;Left Home Layout: One level     Bathroom Shower/Tub: Walk-in shower;Tub only   Bathroom Toilet: Standard     Home Equipment: Cane - single point;Shower seat   Additional Comments: wears 2-3 L [place] at baseline      Prior Functioning/Environment Level of Independence: Independent        Comments: Independent with ADLs, IADLs in the home without AD. Pt was driving. Cares for grandson while daughter at work. Since recent admission has been less active and resting        OT Problem List: Decreased activity tolerance;Cardiopulmonary status limiting activity;Impaired balance (sitting and/or standing);Decreased knowledge of use of DME or AE      OT Treatment/Interventions: Self-care/ADL training;Therapeutic exercise;Energy conservation;DME and/or AE instruction;Patient/family education;Balance training;Therapeutic activities    OT Goals(Current goals can be found in the care plan section) Acute Rehab OT  Goals Patient Stated Goal: return to independence OT Goal Formulation: With patient Time For Goal Achievement: 02/22/20 Potential to Achieve Goals: Good  OT Frequency: Min 2X/week   Barriers to D/C:            Co-evaluation              AM-PAC OT "6 Clicks" Daily Activity     Outcome Measure Help from another person eating meals?: None Help from another person taking care of personal grooming?: A Little Help from another person toileting, which includes using toliet, bedpan, or urinal?: A Little Help from another person bathing (including washing, rinsing, drying)?: A Little Help from another person to put on and taking off regular upper body clothing?: A Little Help from another person to put on and taking off regular lower body clothing?: A Little 6 Click Score: 19   End of Session Equipment Utilized During Treatment: Oxygen Nurse Communication: Mobility status  Activity Tolerance: Patient tolerated treatment well Patient left: in bed;with call bell/phone within reach  OT Visit Diagnosis: Unsteadiness on feet (R26.81);Other abnormalities of gait and mobility (R26.89)                Time: 3825-0539 OT Time Calculation (min): 25 min Charges:  OT General Charges $OT Visit: 1 Visit OT Evaluation $OT Eval Moderate Complexity: 1 Mod OT Treatments $Self Care/Home  Management : 8-22 mins  02/08/2020  Donne Hazel, MOT, OTR/L   [place] 02/08/2020, 4:28 PM

## 2020-02-08 NOTE — Progress Notes (Signed)
  Echocardiogram 2D Echocardiogram has been performed with [facility].  Gerda Diss 02/08/2020, 4:11 PM

## 2020-02-08 NOTE — ED Notes (Signed)
Echo tech at bedside.

## 2020-02-08 NOTE — Progress Notes (Signed)
PT Cancellation Note  Patient Details Name: Brenda [name] MRN: [id number] DOB: [birth year]   Cancelled Treatment:    Reason Eval/Treat Not Completed: Other (comment) Pt very tearful about upcoming procedure and requesting to hold on PT. Will follow up as schedule allows.   Farley Ly, PT, DPT  Acute Rehabilitation Services  Pager: [phone] Office: [phone]    Lehman Prom 02/08/2020, 11:22 AM

## 2020-02-08 NOTE — Progress Notes (Signed)
STROKE TEAM PROGRESS NOTE   SUBJECTIVE (INTERVAL HISTORY) No family is at the bedside.  Patient neurologically intact.  Stated that her symptoms are resolved.  MRI notes stroke.  However, CT head and neck showed basal artery stenosis.  Discussed with IR Dr. Corliss Skains, plan for cerebral angiogram.   OBJECTIVE Temp:  [98.5 F (36.9 C)] 98.5 F (36.9 C) (09/08 2145) Pulse Rate:  [64-82] 72 (09/09 1330) Resp:  [13-21] 16 (09/09 1330) BP: (90-121)/(53-82) 106/57 (09/09 1330) SpO2:  [94 %-99 %] 98 % (09/09 1330)  Recent Labs  Lab 02/04/20 1133 02/07/20 2051 02/08/20 0101 02/08/20 0750 02/08/20 1215  GLUCAP 112* 157* 162* 112* 108*   Recent Labs  Lab 02/02/20 1446 02/02/20 1446 02/03/20 1919 02/04/20 0645 02/07/20 2053 02/07/20 2055  NA 139  --  138 139 138 137  K 5.2*  --  5.2* 4.6 4.4 4.2  CL 101  --  103 102 98 100  CO2 25  --  25 27 28   --   GLUCOSE 151*  --  190* 126* 138* 139*  BUN 25*  --  24* 24* 21 24*  CREATININE 1.16*  --  1.10* 1.11* 1.38* 1.50*  CALCIUM 9.2   < > 9.2 9.3 9.5  --    < > = values in this interval not displayed.   Recent Labs  Lab 02/02/20 1446 02/03/20 1919 02/04/20 0645 02/07/20 2053  AST 166* 57* 35 23  ALT 924* 561* 457* 126*  ALKPHOS 86 87 76 88  BILITOT 0.9 0.5 0.7 0.5  PROT 6.6 6.5 6.5 7.0  ALBUMIN 3.3* 3.2* 3.1* 3.5   Recent Labs  Lab 02/02/20 1446 02/03/20 1919 02/04/20 0645 02/07/20 2053 02/07/20 2055  WBC 12.0* 10.9* 16.8* 18.0*  --   NEUTROABS 10.0* 9.0* 10.4* 10.9*  --   HGB 12.7 12.9 13.1 14.4 15.3*  HCT 40.2 39.9 40.5 44.6 45.0  MCV 93.7 91.7 92.3 92.9  --   PLT 140* 183 192 277  --    No results for input(s): CKTOTAL, CKMB, CKMBINDEX, TROPONINI in the last 168 hours. Recent Labs    02/07/20 2053  LABPROT 12.8  INR 1.0   Recent Labs    02/08/20 0919  COLORURINE YELLOW  LABSPEC >1.046*  PHURINE 6.0  GLUCOSEU NEGATIVE  HGBUR NEGATIVE  BILIRUBINUR NEGATIVE  KETONESUR NEGATIVE  PROTEINUR NEGATIVE   NITRITE NEGATIVE  LEUKOCYTESUR NEGATIVE       Component Value Date/Time   CHOL 100 07/31/2019 0808   TRIG 102.0 07/31/2019 0808   HDL 33.20 (L) 07/31/2019 0808   CHOLHDL 3 07/31/2019 0808   VLDL 20.4 07/31/2019 0808   LDLCALC 46 07/31/2019 0808   Lab Results  Component Value Date   HGBA1C 6.2 (H) 01/30/2020      Component Value Date/Time   LABOPIA NONE DETECTED 02/08/2020 0919   COCAINSCRNUR NONE DETECTED 02/08/2020 0919   LABBENZ NONE DETECTED 02/08/2020 0919   AMPHETMU NONE DETECTED 02/08/2020 0919   THCU POSITIVE (A) 02/08/2020 0919   LABBARB NONE DETECTED 02/08/2020 0919    Recent Labs  Lab 02/07/20 2053  ETH <10    I have personally reviewed the radiological images below and agree with the radiology interpretations.  CT Angio Head W or Wo Contrast  Result Date: 02/07/2020 CLINICAL DATA:  Left-sided weakness, code stroke follow-up EXAM: CT ANGIOGRAPHY HEAD AND NECK TECHNIQUE: Multidetector CT imaging of the head and neck was performed using the standard protocol during bolus administration of intravenous contrast. Multiplanar CT  image reconstructions and MIPs were obtained to evaluate the vascular anatomy. Carotid stenosis measurements (when applicable) are obtained utilizing NASCET criteria, using the distal internal carotid diameter as the denominator. CONTRAST:  80mL OMNIPAQUE IOHEXOL 350 MG/ML SOLN COMPARISON:  None. FINDINGS: CTA NECK Aortic arch: Great vessel origins are patent with mild calcified plaque. Right carotid system: Patent. Patent mixed but primarily noncalcified plaque at the ICA origin causing less than 50% stenosis. Left carotid system: Patent. Mild calcified plaque at the ICA origin causing less than 50% stenosis. Vertebral arteries: Patent. Right vertebral artery is dominant. Left vertebral artery origin is not well seen due to artifact. Skeleton: Multilevel degenerative changes of the cervical spine. Other neck: No mass or adenopathy. Upper chest: No  apical lung mass. Review of the MIP images confirms the above findings CTA HEAD Anterior circulation: Intracranial internal carotid arteries are patent. Posterior circulation: Intracranial right vertebral artery is patent. Patent right PICA origin. Intracranial left vertebral artery becomes diminutive with high-grade stenosis or occlusion distally. Basilar artery is patent. Small left AICA appears to be present. There is high-grade stenosis near the superior cerebellar origins. The superior cerebellar artery origins are poorly opacified with reconstitution. Bilateral posterior communicating arteries are present. There is fetal origin of the right PCA. Venous sinuses: Patent as allowed by contrast bolus timing. Review of the MIP images confirms the above findings IMPRESSION: No large vessel occlusion. Plaque at the ICA origins causes less than 50% stenosis. Small caliber nondominant left vertebral artery becomes diminutive intracranially with high-grade stenosis or occlusion distally. A left PICA origin is not identified and may be congenitally absent. A small left AICA appears to be present. Focal high-grade stenosis of the basilar artery at the superior cerebellar artery origins. These results were called by telephone at the time of interpretation on 02/07/2020 at 9:39 pm to provider Dr. Iver Nestle, who verbally acknowledged these results. Electronically Signed   By: Guadlupe Spanish M.D.   On: 02/07/2020 21:41   CT Angio Neck W and/or Wo Contrast  Result Date: 02/07/2020 CLINICAL DATA:  Left-sided weakness, code stroke follow-up EXAM: CT ANGIOGRAPHY HEAD AND NECK TECHNIQUE: Multidetector CT imaging of the head and neck was performed using the standard protocol during bolus administration of intravenous contrast. Multiplanar CT image reconstructions and MIPs were obtained to evaluate the vascular anatomy. Carotid stenosis measurements (when applicable) are obtained utilizing NASCET criteria, using the distal internal  carotid diameter as the denominator. CONTRAST:  80mL OMNIPAQUE IOHEXOL 350 MG/ML SOLN COMPARISON:  None. FINDINGS: CTA NECK Aortic arch: Great vessel origins are patent with mild calcified plaque. Right carotid system: Patent. Patent mixed but primarily noncalcified plaque at the ICA origin causing less than 50% stenosis. Left carotid system: Patent. Mild calcified plaque at the ICA origin causing less than 50% stenosis. Vertebral arteries: Patent. Right vertebral artery is dominant. Left vertebral artery origin is not well seen due to artifact. Skeleton: Multilevel degenerative changes of the cervical spine. Other neck: No mass or adenopathy. Upper chest: No apical lung mass. Review of the MIP images confirms the above findings CTA HEAD Anterior circulation: Intracranial internal carotid arteries are patent. Posterior circulation: Intracranial right vertebral artery is patent. Patent right PICA origin. Intracranial left vertebral artery becomes diminutive with high-grade stenosis or occlusion distally. Basilar artery is patent. Small left AICA appears to be present. There is high-grade stenosis near the superior cerebellar origins. The superior cerebellar artery origins are poorly opacified with reconstitution. Bilateral posterior communicating arteries are present. There is fetal origin  of the right PCA. Venous sinuses: Patent as allowed by contrast bolus timing. Review of the MIP images confirms the above findings IMPRESSION: No large vessel occlusion. Plaque at the ICA origins causes less than 50% stenosis. Small caliber nondominant left vertebral artery becomes diminutive intracranially with high-grade stenosis or occlusion distally. A left PICA origin is not identified and may be congenitally absent. A small left AICA appears to be present. Focal high-grade stenosis of the basilar artery at the superior cerebellar artery origins. These results were called by telephone at the time of interpretation on 02/07/2020  at 9:39 pm to provider Dr. Iver NestleBhagat, who verbally acknowledged these results. Electronically Signed   By: Guadlupe SpanishPraneil  Patel M.D.   On: 02/07/2020 21:41   CT CHEST WO CONTRAST  Result Date: 01/31/2020 CLINICAL DATA:  Respiratory distress, COPD exacerbation EXAM: CT CHEST WITHOUT CONTRAST TECHNIQUE: Multidetector CT imaging of the chest was performed following the standard protocol without IV contrast. COMPARISON:  Radiograph 01/30/2020, CT 09/07/2017 FINDINGS: Cardiovascular: Normal cardiac size. No pericardial effusion. Few scattered coronary artery calcifications are present. Atherosclerotic calcifications present in the normal caliber thoracic aorta and proximal great vessels which branch normally from the aortic arch. No periaortic stranding or hemorrhage. Central pulmonary arteries are normal caliber. Luminal evaluation precluded in the absence of contrast media. No major venous abnormalities are seen. Mediastinum/Nodes: No mediastinal fluid or gas. Normal thyroid gland and thoracic inlet. No acute abnormality of the trachea or esophagus. No worrisome mediastinal or axillary adenopathy.Few prominent paratracheal nodes are present but not significantly changed from prior including a 9 mm lymph node with preserved fatty hilum (3/20). Hilar nodal evaluation is limited in the absence of intravenous contrast media. Lungs/Pleura: There is diffuse mild airways thickening. Mixed ground-glass, consolidation and tree-in-bud nodularity is present predominantly in the right lower lobe and posterior segment right upper lobe to a lesser extent. Some mild interlobular septal and fissural thickening is noted as well with trace bilateral effusions. Central pulmonary vascular congestion is present with redistributed pulmonary vascularity. Additional bandlike areas of opacity may reflect atelectatic change or scarring. No pneumothorax. Few scattered calcified granulomata. No concerning pulmonary nodules or masses within the  limitations of parenchymal disease though follow-up to resolution should be considered. Upper Abdomen: Patient appears to be post cholecystectomy. Interposition of the hepatic flexure anterior to the liver. Stable lobular thickening of the left adrenal gland. Musculoskeletal: Multilevel degenerative changes are present in the imaged portions of the spine. Some sclerotic likely Modic type endplate changes noted throughout the mid to lower thoracic levels. No acute or concerning osseous lesions. No worrisome chest wall masses or lesions. Mild body wall edema is noted. IMPRESSION: 1. Mixed ground-glass, consolidation and tree-in-bud nodularity predominantly in the right lower lobe and to a lesser extent the posterior segment right upper lobe. Findings could reflect an acute multifocal infection or inflammatory process or sequela of aspiration. 2. Additional superimposed features of septal thickening and vascular redistribution/congestion with trace effusions may suggest some superimposed edematous change as well. 3. Mild body wall edema. 4. Aortic Atherosclerosis (ICD10-I70.0). 5. Coronary artery calcifications are present. Please note that the presence of coronary artery calcium documents the presence of coronary artery disease, the severity of this disease and any potential stenosis cannot be assessed on this non-gated CT examination. Electronically Signed   By: Kreg ShropshirePrice  DeHay M.D.   On: 01/31/2020 23:56   MR BRAIN WO CONTRAST  Result Date: 02/08/2020 CLINICAL DATA:  Initial evaluation for acute double vision, left-sided weakness, speech difficulty.  EXAM: MRI HEAD WITHOUT CONTRAST TECHNIQUE: Multiplanar, multiecho pulse sequences of the brain and surrounding structures were obtained without intravenous contrast. COMPARISON:  Prior CTs from 02/07/2020. FINDINGS: Brain: Examination degraded by motion artifact. Cerebral volume within normal limits for age. Few scattered foci of T2/FLAIR hyperintensity noted involving  the supratentorial cerebral white matter, nonspecific, but felt to be within normal limits for age and of doubtful significance. No abnormal foci of restricted diffusion to suggest acute or subacute ischemia. Gray-white matter differentiation maintained. No encephalomalacia to suggest chronic cortical infarction. No foci of susceptibility artifact to suggest acute or chronic intracranial hemorrhage. No mass lesion, midline shift or mass effect. No hydrocephalus or extra-axial fluid collection. Incidental note made of an empty sella. Midline structures intact. Vascular: Major intracranial vascular flow voids are maintained. Skull and upper cervical spine: Craniocervical junction within normal limits. No visible focal marrow replacing lesion. No scalp soft tissue abnormality. Sinuses/Orbits: Globes and orbital soft tissues within normal limits. Mild scattered mucosal thickening noted within the ethmoidal air cells. Paranasal sinuses are otherwise clear. Small right greater than left mastoid effusions, of doubtful significance. Inner ear structures grossly normal. Visualized nasopharynx within normal limits. Other: None. IMPRESSION: 1. No acute intracranial abnormality. 2. Empty sella. While this finding is often incidental in nature and of no clinical significance, this can also be seen in the setting of idiopathic intracranial hypertension. 3. Otherwise unremarkable and normal brain MRI for age. Electronically Signed   By: Rise Mu M.D.   On: 02/08/2020 02:39   DG CHEST PORT 1 VIEW  Result Date: 02/08/2020 CLINICAL DATA:  Code stroke patient. Double vision, left-sided weakness and difficulty getting words out. History of asthma, hypertension, bronchitis, COPD. EXAM: PORTABLE CHEST 1 VIEW COMPARISON:  01/30/2020 FINDINGS: Shallow inspiration. Heart size and pulmonary vascularity are normal. Mild infiltrates in the lung bases with improvement since previous study. No pleural effusions. No pneumothorax.  Mediastinal contours appear intact. IMPRESSION: Mild infiltrates in the lung bases with improvement since previous study. Electronically Signed   By: Burman Nieves M.D.   On: 02/08/2020 00:31   DG Chest Portable 1 View  Result Date: 01/30/2020 CLINICAL DATA:  Shortness of breath.  Potential COVID. EXAM: PORTABLE CHEST 1 VIEW COMPARISON:  05/30/2019. FINDINGS: Borderline cardiomegaly. No pulmonary venous congestion. Diffuse prominent bilateral interstitial prominence itis. No pleural effusion or pneumothorax. IMPRESSION: 1. Diffuse prominent bilateral interstitial prominence consistent with pneumonitis. 2.  Borderline cardiomegaly.  No pulmonary venous congestion. Electronically Signed   By: Maisie Fus  Register   On: 01/30/2020 11:09   DG Swallowing Func-Speech Pathology  Result Date: 02/02/2020 Objective Swallowing Evaluation: Type of Study: MBS-Modified Barium Swallow Study  Patient Details Name: RAIHANA BALDERRAMA MRN: [id number] Date of Birth: 1961-01-17 Today's Date: 02/02/2020 Time: SLP Start Time (ACUTE ONLY): 0949 -SLP Stop Time (ACUTE ONLY): 1003 SLP Time Calculation (min) (ACUTE ONLY): 14 min Past Medical History: Past Medical History: Diagnosis Date . Acute pancreatitis  . Asthma   main dyspnea thought due to deconditioning (10/2013) . Bipolar I disorder, most recent episode (or current) manic, unspecified   pt denies this . Bronchitis, chronic obstructive (HCC) 2008 & 2009  FeV1 64% TLC 105% DLCO 55% 2008  -  FeV1 81% FeF 25-75 43% 2009 . COPD (chronic obstructive pulmonary disease) (HCC)   emphysema, not an issue per pulm (10/2013) . History of pyelonephritis 05/2012 . HTN (hypertension)  . Hx of migraines  . Knee pain, right  . Leukocytosis, unspecified  . Lumbar disc disease with  radiculopathy   multilevel spondylitic changes, scoliosis, and anterolisthesis L4 not thought to currently be surg candidate (Dr. Phoebe Perch, [facility]) (Dr. Ollen Bowl, [facility]) . Nicotine addiction  . Nonspecific abnormal electrocardiogram  (ECG) (EKG)  . Obesity  . Pure hyperglyceridemia  . Suicide attempt (HCC) 06/2001 . Swelling of limb  . Urge and stress incontinence 07/2012  ([facility]) Past Surgical History: Past Surgical History: Procedure Laterality Date . CHOLECYSTECTOMY  07/1982 . CT MAXILLOFACIAL WO/W CM  06/14/06  Left max mucocele . CT of chest  06/14/2006  Normal except c/w active inflammation / infection.  Left renal atrophy . ERCP / sphincterotomy - stenosis  01/15/06 . lumbar MRI  11/2010  multilevel spondylitic changes with upper lumbar scoliosis and anterolisthesis of L4 on 5 with some L foraminal narrowing esp at L/3 with concavity of scoliosis, some central stenosis and biforaminal narrowing at L4/5 with spondylolisthesis . [place] - Pneumonia, acute asthma, left maxillary sinusitis  01/11 - 06/18/2006 . Retained stone    ERCP secondary to retained stone . Right knee surgery  1984,1989,1989,1991,1994  Reconstructions for ACL insuff . TUBAL LIGATION   HPI: Pt is a 59 y.o. female with medical history significant of chronic hypoxic respiratory failure on 2 to 3 L baseline O2 and CPAP at bedtime, COPD Gold stage III (FEV1 44%, improved with bronchodilator to 50%, 2019), chronic cigarette smoker 5-10 cigarettes a day hypertension, OSA on CPAP at bedtime, IIDM, diabetic neuropathy, chronic pain, depression/anxiety, morbid obesity who presented to the ED with increasing short of breath. Rapid response on 9/1 due to respiratory distress and was placed on non rebreather mask. CT chest 9/1: Mixed ground-glass, consolidation and tree-in-bud nodularity predominantly in the right lower lobe and to a lesser extent the posterior segment right upper lobe. Findings could reflect an acute multifocal infection or inflammatory process or sequela of aspiration. Pt reported "getting choked on food often" to Dr. Katrinka Blazing.  No data recorded Assessment / Plan / Recommendation CHL IP CLINICAL IMPRESSIONS 02/02/2020 Clinical Impression Pt's oropharyngeal swallow  mechanism was within functional limits with transient penetration (PAS 2) of thin liquids when consecutive swallows were used. Esophageal screening revealed backflow of material from the lower to upper thoracic esophagus and mild esophageal stasis. Backflow to the pharynx was not observed while the fluoro was on. However, pt demonstrated subsequent coughing and the possibility of aspiration secondary to backflowed material reaching the cervical esophagus and pharynx is suspected. Considering the results of the esophageal screening combined with her reported symptoms, assessment (e.g., esophagram) of the esophageal phase of her swallow is recommended. Pt was educated regarding results and recommendations as well as strategies to reduce aspiration risk during episodes of dyspnea. She verbalized understanding. Further skilled SLP services are not clinically indicated at this time.  SLP Visit Diagnosis Dysphagia, unspecified (R13.10) Attention and concentration deficit following -- Frontal lobe and executive function deficit following -- Impact on safety and function Mild aspiration risk   CHL IP TREATMENT RECOMMENDATION 02/02/2020 Treatment Recommendations No treatment recommended at this time   Prognosis 02/02/2020 Prognosis for Safe Diet Advancement Good Barriers to Reach Goals -- Barriers/Prognosis Comment -- CHL IP DIET RECOMMENDATION 02/02/2020 SLP Diet Recommendations Regular solids;Thin liquid Liquid Administration via Cup;Straw Medication Administration Whole meds with liquid Compensations Slow rate;Small sips/bites Postural Changes --   No flowsheet data found.  CHL IP FOLLOW UP RECOMMENDATIONS 02/02/2020 Follow up Recommendations None   CHL IP FREQUENCY AND DURATION 02/02/2020 Speech Therapy Frequency (ACUTE ONLY) min 2x/week Treatment Duration 1 week  CHL IP ORAL PHASE 02/02/2020 Oral Phase WFL Oral - Pudding Teaspoon -- Oral - Pudding Cup -- Oral - Honey Teaspoon -- Oral - Honey Cup -- Oral - Nectar Teaspoon --  Oral - Nectar Cup -- Oral - Nectar Straw -- Oral - Thin Teaspoon -- Oral - Thin Cup -- Oral - Thin Straw -- Oral - Puree -- Oral - Mech Soft -- Oral - Regular -- Oral - Multi-Consistency -- Oral - Pill -- Oral Phase - Comment --  CHL IP PHARYNGEAL PHASE 02/02/2020 Pharyngeal Phase WFL Pharyngeal- Pudding Teaspoon -- Pharyngeal -- Pharyngeal- Pudding Cup -- Pharyngeal -- Pharyngeal- Honey Teaspoon -- Pharyngeal -- Pharyngeal- Honey Cup -- Pharyngeal -- Pharyngeal- Nectar Teaspoon -- Pharyngeal -- Pharyngeal- Nectar Cup -- Pharyngeal -- Pharyngeal- Nectar Straw -- Pharyngeal -- Pharyngeal- Thin Teaspoon -- Pharyngeal -- Pharyngeal- Thin Cup -- Pharyngeal -- Pharyngeal- Thin Straw Penetration/Aspiration during swallow Pharyngeal Material enters airway, remains ABOVE vocal cords then ejected out Pharyngeal- Puree -- Pharyngeal -- Pharyngeal- Mechanical Soft -- Pharyngeal -- Pharyngeal- Regular -- Pharyngeal -- Pharyngeal- Multi-consistency -- Pharyngeal -- Pharyngeal- Pill -- Pharyngeal -- Pharyngeal Comment --  CHL IP CERVICAL ESOPHAGEAL PHASE 02/02/2020 Cervical Esophageal Phase (No Data) Pudding Teaspoon -- Pudding Cup -- Honey Teaspoon -- Honey Cup -- Nectar Teaspoon -- Nectar Cup -- Nectar Straw -- Thin Teaspoon -- Thin Cup -- Thin Straw -- Puree -- Mechanical Soft -- Regular -- Multi-consistency -- Pill -- Cervical Esophageal Comment -- Shanika I. Vear Clock, MS, CCC-SLP Acute Rehabilitation Services Office number [phone] Pager [phone] Scheryl Marten 02/02/2020, 10:28 AM              ECHOCARDIOGRAM COMPLETE  Result Date: 01/31/2020    ECHOCARDIOGRAM REPORT   Patient Name:   ROMELIA BROMELL Date of Exam: 01/31/2020 Medical Rec #:  [id number]     Height:       61.0 in Accession #:    [id number]    Weight:       216.0 lb Date of Birth:  08/17/1960     BSA:          1.952 m Patient Age:    59 years      BP:           102/66 mmHg Patient Gender: F             HR:           71 bpm. Exam Location:  Inpatient  Procedure: 2D Echo Indications:    dyspnea  History:        Patient has no prior history of Echocardiogram examinations.                 COPD; Risk Factors:Hypertension and Current Smoker.  Sonographer:    Celene Skeen RDCS (AE) Referring Phys: [id number] [facility]  Sonographer Comments: Image acquisition challenging due to patient body habitus, Image acquisition challenging due to respiratory motion and Image acquisition challenging due to COPD. IMPRESSIONS  1. Technically difficult study, even with Definity contrast. There is no left ventricular thrombus. There is relatively preserved systolic function in the basal segments. Findings suggest stress cardiomyopathy (takotsubo sd.), but could well represent multivessel CAD. Left ventricular ejection fraction, by estimation, is 30 to 35%. The left ventricle has moderate to severely decreased function. The left ventricle demonstrates global hypokinesis. The left ventricular internal cavity size was mildly dilated. Left ventricular diastolic parameters are consistent with Grade II diastolic dysfunction (pseudonormalization). Elevated left atrial pressure.  2. Right  ventricular systolic function was not well visualized. The right ventricular size is normal. Tricuspid regurgitation signal is inadequate for assessing PA pressure.  3. The mitral valve is grossly normal. No evidence of mitral valve regurgitation. No evidence of mitral stenosis.  4. The aortic valve is grossly normal. Aortic valve regurgitation is not visualized. No aortic stenosis is present. Comparison(s): Prior images reviewed side by side. The left ventricular function is significantly worse. The left ventricular wall motion abnormalities are new. Consider serial studies, with Definity. FINDINGS  Left Ventricle: Technically difficult study, even with Definity contrast. There is no left ventricular thrombus. There is relatively preserved systolic function in the basal segments. Findings suggest stress  cardiomyopathy (takotsubo sd.), but could well represent multivessel CAD. Left ventricular ejection fraction, by estimation, is 30 to 35%. The left ventricle has moderate to severely decreased function. The left ventricle demonstrates global hypokinesis. Definity contrast agent was given IV to delineate the left ventricular endocardial borders. The left ventricular internal cavity size was mildly dilated. There is no left ventricular hypertrophy. Left ventricular diastolic parameters are consistent with Grade II diastolic dysfunction (pseudonormalization). Elevated left atrial pressure. Right Ventricle: The right ventricular size is normal. No increase in right ventricular wall thickness. Right ventricular systolic function was not well visualized. Tricuspid regurgitation signal is inadequate for assessing PA pressure. Left Atrium: Left atrial size was normal in size. Right Atrium: Right atrial size was normal in size. Pericardium: There is no evidence of pericardial effusion. Mitral Valve: The mitral valve is grossly normal. No evidence of mitral valve regurgitation. No evidence of mitral valve stenosis. Tricuspid Valve: The tricuspid valve is normal in structure. Tricuspid valve regurgitation is not demonstrated. Aortic Valve: The aortic valve is grossly normal. Aortic valve regurgitation is not visualized. No aortic stenosis is present. Pulmonic Valve: The pulmonic valve was not well visualized. Pulmonic valve regurgitation is not visualized. Aorta: The aortic root is normal in size and structure. IAS/Shunts: No atrial level shunt detected by color flow Doppler.  LEFT VENTRICLE PLAX 2D LVIDd:         5.50 cm  Diastology LVIDs:         4.20 cm  LV e' lateral:   7.51 cm/s LV PW:         0.90 cm  LV E/e' lateral: 11.5 LV IVS:        1.10 cm LVOT diam:     2.40 cm LV SV:         80 LV SV Index:   41 LVOT Area:     4.52 cm  LEFT ATRIUM           Index LA diam:      3.70 cm 1.90 cm/m LA Vol (A4C): 47.7 ml 24.44 ml/m   AORTIC VALVE LVOT Vmax:   96.60 cm/s LVOT Vmean:  70.600 cm/s LVOT VTI:    0.176 m  AORTA Ao Root diam: 2.70 cm MITRAL VALVE MV Area (PHT): 3.54 cm    SHUNTS MV Decel Time: 214 msec    Systemic VTI:  0.18 m MV E velocity: 86.50 cm/s  Systemic Diam: 2.40 cm MV A velocity: 72.00 cm/s MV E/A ratio:  1.20 Mihai Croitoru MD Electronically signed by Thurmon Fair MD Signature Date/Time: 01/31/2020/4:22:37 PM    Final    CT HEAD CODE STROKE WO CONTRAST  Result Date: 02/07/2020 CLINICAL DATA:  Code stroke.  Left-sided weakness EXAM: CT HEAD WITHOUT CONTRAST TECHNIQUE: Contiguous axial images were obtained from the base of the  skull through the vertex without intravenous contrast. COMPARISON:  None. FINDINGS: Brain: There is no acute intracranial hemorrhage, mass effect, edema, or loss of gray-white differentiation. Ventricles and sulci are normal in size and configuration. There is no extra-axial collection. Vascular: No hyperdense vessel. Atherosclerotic calcification is present at the skull base. Skull: Unremarkable. Sinuses/Orbits: Patchy ethmoid opacification. Orbits are unremarkable. Other: Mastoid air cells are clear. ASPECTS (Alberta Stroke Program Early CT Score) - Ganglionic level infarction (caudate, lentiform nuclei, internal capsule, insula, M1-M3 cortex): 7 - Supraganglionic infarction (M4-M6 cortex): 3 Total score (0-10 with 10 being normal): 10 IMPRESSION: No acute intracranial hemorrhage or evidence of acute infarction. ASPECT score is 10. These results were communicated to Dr. Iver Nestle at 9:10 pm on 02/07/2020 by text page via the [facility] messaging system. Electronically Signed   By: Guadlupe Spanish M.D.   On: 02/07/2020 21:13   US Abdomen Limited RUQ  Result Date: 01/30/2020 CLINICAL DATA:  Elevated liver enzymes. EXAM: ULTRASOUND ABDOMEN LIMITED RIGHT UPPER QUADRANT COMPARISON:  01/11/2006 abdominal ultrasound. FINDINGS: Gallbladder: Surgically absent. Common bile duct: Diameter: 2.4 mm Liver: No focal  lesion identified. Increased parenchymal echogenicity. Portal vein is patent on color Doppler imaging with normal direction of blood flow towards the liver. Other: None. IMPRESSION: Hepatic steatosis.  No focal hepatic lesions. Sequela of cholecystectomy. Electronically Signed   By: Stana Bunting M.D.   On: 01/30/2020 16:50   DG ESOPHAGUS W SINGLE CM (SOL OR THIN BA)  Result Date: 02/02/2020 CLINICAL DATA:  Shortness of breath weakness. EXAM: ESOPHOGRAM/BARIUM SWALLOW TECHNIQUE: Single contrast examination was performed using  thin barium. FLUOROSCOPY TIME:  Fluoroscopy Time:  1 minutes 42 seconds Radiation Exposure Index (if provided by the fluoroscopic device): 14.3 mGy Number of Acquired Spot Images: 0 COMPARISON:  Chest CT of 01/31/2020 FINDINGS: Esophagus grossly normal. Limited assessment in semi recumbent positioning as the patient could not stand for the evaluation. Distensibility is normal. Mild deviation near the cardiac silhouette likely due to LEFT atrial enlargement based on comparison with recent CT. Motility could not be fully assessed as the patient was unable to lie in the prone RAO position due to current dyspnea. However, on the last swallow obtained there was proximal escape of the bolus with head of bed slightly elevated in moderate tertiary peristaltic activity compatible with esophageal dysmotility. IMPRESSION: 1. Limited study without abnormality aside from signs of mild-to-moderate dysmotility. If there are continued symptoms that would suggest esophageal pathology a study as an outpatient could be performed for more complete assessment when the patient has recovered from this episode of acute illness. Electronically Signed   By: Donzetta Kohut M.D.   On: 02/02/2020 13:39    PHYSICAL EXAM  Temp:  [98.5 F (36.9 C)] 98.5 F (36.9 C) (09/08 2145) Pulse Rate:  [64-82] 72 (09/09 1330) Resp:  [13-21] 16 (09/09 1330) BP: (90-121)/(53-82) 106/57 (09/09 1330) SpO2:  [94 %-99 %]  98 % (09/09 1330)  General - Well nourished, well developed, in no apparent distress.  Ophthalmologic - fundi not visualized due to noncooperation.  Cardiovascular - Regular rhythm and rate.  Mental Status -  Level of arousal and orientation to time, place, and person were intact. Language including expression, naming, repetition, comprehension was assessed and found intact. Fund of Knowledge was assessed and was intact.  Cranial Nerves II - XII - II - Visual field intact OU. III, IV, VI - Extraocular movements intact. V - Facial sensation intact bilaterally. VII - Facial movement intact bilaterally. VIII -  Hearing & vestibular intact bilaterally. X - Palate elevates symmetrically. XI - Chin turning & shoulder shrug intact bilaterally. XII - Tongue protrusion intact.  Motor Strength - The patient's strength was normal in all extremities and pronator drift was absent.  Bulk was normal and fasciculations were absent.   Motor Tone - Muscle tone was assessed at the neck and appendages and was normal.  Reflexes - The patient's reflexes were symmetrical in all extremities and she had no pathological reflexes.  Sensory - Light touch, temperature/pinprick were assessed and were symmetrical.    Coordination - The patient had normal movements in the hands and feet with no ataxia or dysmetria.  Tremor was absent.  Gait and Station - deferred.   ASSESSMENT/PLAN [name] is a 59 y.o. female with history of hypertension, hyperlipidemia, diabetes, COPD, OSA on CPAP, morbid obesity, current smoker admitted for episode of left-sided weakness, nausea vomiting, slurred speech and double vision. No tPA given due to also window and symptom resolved.    TIA: Posterior circulation TIA secondary to basilar artery stenosis  CT no acute finding.    CTA head and neck mid basilar artery stenosis.  Left VA hypoplastic versus stenosis.  Atherosclerosis bilateral ICA bulbs.  MRI no acute  infarct  2D Echo  Pending, 01/31/2020 EF 30 to 35%  LDL pending  HgbA1c 6.2  UDS positive for THC  Lovenox for VTE prophylaxis  aspirin 81 mg daily prior to admission, now on aspirin 325 mg daily and clopidogrel 75 mg daily after aspirin and the Plavix load.   Patient counseled to be compliant with her antithrombotic medications  Ongoing aggressive stroke risk factor management  Therapy recommendations: Pending  Disposition: Pending  Cardiomyopathy Recent NSTEMI  Admitted 8/31-9/5 for COPD exacerbation, RSV pneumonia and respiratory failure  Found to have non-STEMI and EF 30 to 35%  Was treated with heparin drip and aspirin  Discharged with aspirin statin and metoprolol as well as losartan  This admission 2D echo pending  AKI  Creatinine 1.1->1.38->1.50  On IV fluid  Diabetes  HgbA1c 6.2 goal < 7.0  Controlled  CBG monitoring  SSI  DM education and close PCP follow up  Hypotension History of hypertension . BP at low side 100-110s . Permissive hypertension (OK if <220/120) for 24-48 hours post stroke and then gradually normalized within 5-7 days.  . BP goal 120-150 given based artery stenosis . On IV fluid . Avoid low BP  Hyperlipidemia  Home meds: Lipitor 20  LDL pending, goal < 70  Now on Lipitor 20  Continue statin at discharge  Tobacco abuse  Current smoker  Smoking cessation counseling provided  Pt is willing to quit  Other Stroke Risk Factors  Morbid obesity, BMI 41.17  Obstructive sleep apnea, on CPAP at home  THC abuse, cessation education provided  Other Active Problems  Frequent COPD exacerbation  Leukocytosis WBC 10.9->16.8->18.0  Hospital day # 0  I spent  35 minutes in total face-to-face time with the patient, more than 50% of which was spent in counseling and coordination of care, reviewing test results, images and medication, and discussing the diagnosis, treatment plan and potential prognosis. This patient's  care requiresreview of multiple databases, neurological assessment, discussion with family, other specialists and medical decision making of high complexity. I discussed with Dr. Blake Divine and Dr. Corliss Skains. Pt has high risk of neuro deterioration due to high-grade basal artery stenosis.   Marvel Plan, MD PhD Stroke Neurology 02/08/2020 2:05 PM  To contact Stroke Continuity provider, please refer to [email or web address] After hours, contact General Neurology

## 2020-02-09 ENCOUNTER — Ambulatory Visit: Payer: Medicare Other | Attending: Family Medicine

## 2020-02-09 ENCOUNTER — Observation Stay (HOSPITAL_COMMUNITY): Payer: Medicare Other

## 2020-02-09 DIAGNOSIS — I700 Atherosclerosis of aorta: Secondary | ICD-10-CM | POA: Diagnosis present

## 2020-02-09 DIAGNOSIS — I429 Cardiomyopathy, unspecified: Secondary | ICD-10-CM | POA: Diagnosis present

## 2020-02-09 DIAGNOSIS — I110 Hypertensive heart disease with heart failure: Secondary | ICD-10-CM | POA: Diagnosis not present

## 2020-02-09 DIAGNOSIS — I252 Old myocardial infarction: Secondary | ICD-10-CM | POA: Diagnosis not present

## 2020-02-09 DIAGNOSIS — I130 Hypertensive heart and chronic kidney disease with heart failure and stage 1 through stage 4 chronic kidney disease, or unspecified chronic kidney disease: Secondary | ICD-10-CM | POA: Diagnosis not present

## 2020-02-09 DIAGNOSIS — F17210 Nicotine dependence, cigarettes, uncomplicated: Secondary | ICD-10-CM | POA: Diagnosis not present

## 2020-02-09 DIAGNOSIS — G4733 Obstructive sleep apnea (adult) (pediatric): Secondary | ICD-10-CM | POA: Diagnosis not present

## 2020-02-09 DIAGNOSIS — F1210 Cannabis abuse, uncomplicated: Secondary | ICD-10-CM | POA: Diagnosis present

## 2020-02-09 DIAGNOSIS — E781 Pure hyperglyceridemia: Secondary | ICD-10-CM | POA: Diagnosis not present

## 2020-02-09 DIAGNOSIS — Z803 Family history of malignant neoplasm of breast: Secondary | ICD-10-CM | POA: Diagnosis not present

## 2020-02-09 DIAGNOSIS — E1165 Type 2 diabetes mellitus with hyperglycemia: Secondary | ICD-10-CM | POA: Diagnosis present

## 2020-02-09 DIAGNOSIS — J9611 Chronic respiratory failure with hypoxia: Secondary | ICD-10-CM | POA: Diagnosis not present

## 2020-02-09 DIAGNOSIS — I651 Occlusion and stenosis of basilar artery: Secondary | ICD-10-CM | POA: Diagnosis not present

## 2020-02-09 DIAGNOSIS — Z8249 Family history of ischemic heart disease and other diseases of the circulatory system: Secondary | ICD-10-CM | POA: Diagnosis not present

## 2020-02-09 DIAGNOSIS — Z6841 Body Mass Index (BMI) 40.0 and over, adult: Secondary | ICD-10-CM | POA: Diagnosis not present

## 2020-02-09 DIAGNOSIS — Z825 Family history of asthma and other chronic lower respiratory diseases: Secondary | ICD-10-CM | POA: Diagnosis not present

## 2020-02-09 DIAGNOSIS — Z20822 Contact with and (suspected) exposure to covid-19: Secondary | ICD-10-CM | POA: Diagnosis present

## 2020-02-09 DIAGNOSIS — G459 Transient cerebral ischemic attack, unspecified: Secondary | ICD-10-CM | POA: Diagnosis not present

## 2020-02-09 DIAGNOSIS — F419 Anxiety disorder, unspecified: Secondary | ICD-10-CM | POA: Diagnosis present

## 2020-02-09 DIAGNOSIS — Z83438 Family history of other disorder of lipoprotein metabolism and other lipidemia: Secondary | ICD-10-CM | POA: Diagnosis not present

## 2020-02-09 DIAGNOSIS — I5042 Chronic combined systolic (congestive) and diastolic (congestive) heart failure: Secondary | ICD-10-CM | POA: Diagnosis not present

## 2020-02-09 DIAGNOSIS — D72829 Elevated white blood cell count, unspecified: Secondary | ICD-10-CM | POA: Diagnosis not present

## 2020-02-09 DIAGNOSIS — Z23 Encounter for immunization: Secondary | ICD-10-CM

## 2020-02-09 DIAGNOSIS — E1142 Type 2 diabetes mellitus with diabetic polyneuropathy: Secondary | ICD-10-CM | POA: Diagnosis present

## 2020-02-09 DIAGNOSIS — I771 Stricture of artery: Secondary | ICD-10-CM | POA: Diagnosis not present

## 2020-02-09 DIAGNOSIS — E785 Hyperlipidemia, unspecified: Secondary | ICD-10-CM | POA: Diagnosis present

## 2020-02-09 DIAGNOSIS — N1831 Chronic kidney disease, stage 3a: Secondary | ICD-10-CM | POA: Diagnosis not present

## 2020-02-09 DIAGNOSIS — F17200 Nicotine dependence, unspecified, uncomplicated: Secondary | ICD-10-CM

## 2020-02-09 DIAGNOSIS — E1122 Type 2 diabetes mellitus with diabetic chronic kidney disease: Secondary | ICD-10-CM | POA: Diagnosis present

## 2020-02-09 DIAGNOSIS — Z7984 Long term (current) use of oral hypoglycemic drugs: Secondary | ICD-10-CM | POA: Diagnosis not present

## 2020-02-09 HISTORY — PX: IR ANGIO VERTEBRAL SEL VERTEBRAL UNI R MOD SED: IMG5368

## 2020-02-09 HISTORY — PX: IR ANGIO INTRA EXTRACRAN SEL COM CAROTID INNOMINATE BILAT MOD SED: IMG5360

## 2020-02-09 HISTORY — PX: IR US GUIDE VASC ACCESS RIGHT: IMG2390

## 2020-02-09 HISTORY — PX: IR ANGIO VERTEBRAL SEL SUBCLAVIAN INNOMINATE UNI L MOD SED: IMG5364

## 2020-02-09 LAB — LIPID PANEL
Cholesterol: 117 mg/dL (ref 0–200)
HDL: 37 mg/dL — ABNORMAL LOW (ref >40)
LDL Cholesterol: 52 mg/dL (ref 0–99)
Total CHOL/HDL Ratio: 3.2 RATIO
Triglycerides: 139 mg/dL (ref <150)
VLDL: 28 mg/dL (ref 0–40)

## 2020-02-09 LAB — GLUCOSE, CAPILLARY
Glucose-Capillary: 105 mg/dL — ABNORMAL HIGH (ref 70–99)
Glucose-Capillary: 107 mg/dL — ABNORMAL HIGH (ref 70–99)
Glucose-Capillary: 87 mg/dL (ref 70–99)

## 2020-02-09 LAB — SEDIMENTATION RATE: Sed Rate: 31 mm/hr — ABNORMAL HIGH (ref 0–22)

## 2020-02-09 LAB — HIV ANTIBODY (ROUTINE TESTING W REFLEX): HIV Screen 4th Generation wRfx: NONREACTIVE

## 2020-02-09 MED ORDER — ASPIRIN EC 81 MG PO TBEC
81.0000 mg | DELAYED_RELEASE_TABLET | Freq: Every day | ORAL | Status: DC
  Administered 2020-02-09 – 2020-02-14 (×5): 81 mg via ORAL
  Filled 2020-02-09 (×5): qty 1

## 2020-02-09 MED ORDER — SALINE SPRAY 0.65 % NA SOLN
1.0000 | NASAL | Status: DC | PRN
  Administered 2020-02-09: 1 via NASAL
  Filled 2020-02-09: qty 44

## 2020-02-09 MED ORDER — VERAPAMIL HCL 2.5 MG/ML IV SOLN: INTRA_ARTERIAL | Status: AC | PRN

## 2020-02-09 MED ORDER — IOHEXOL 300 MG/ML  SOLN
50.0000 mL | Freq: Once | INTRAMUSCULAR | Status: AC | PRN
  Administered 2020-02-09: 10 mL via INTRA_ARTERIAL

## 2020-02-09 MED ORDER — IOHEXOL 300 MG/ML  SOLN
150.0000 mL | Freq: Once | INTRAMUSCULAR | Status: AC | PRN
  Administered 2020-02-09: 75 mL via INTRA_ARTERIAL

## 2020-02-09 MED ORDER — VERAPAMIL HCL 2.5 MG/ML IV SOLN
INTRAVENOUS | Status: AC
  Filled 2020-02-09: qty 2

## 2020-02-09 MED ORDER — FENTANYL CITRATE (PF) 100 MCG/2ML IJ SOLN
INTRAMUSCULAR | Status: AC
Start: 2020-02-09 — End: 2020-02-10
  Filled 2020-02-09: qty 2

## 2020-02-09 MED ORDER — SODIUM CHLORIDE 0.9 % IV BOLUS
1000.0000 mL | Freq: Once | INTRAVENOUS | Status: AC
  Administered 2020-02-09: 1000 mL via INTRAVENOUS

## 2020-02-09 MED ORDER — LORAZEPAM 1 MG PO TABS
1.0000 mg | ORAL_TABLET | Freq: Two times a day (BID) | ORAL | Status: DC | PRN
  Administered 2020-02-09 – 2020-02-13 (×5): 1 mg via ORAL
  Filled 2020-02-09 (×6): qty 1

## 2020-02-09 MED ORDER — SODIUM CHLORIDE 0.9 % IV SOLN: INTRAVENOUS | Status: AC

## 2020-02-09 MED ORDER — NITROGLYCERIN 1 MG/10 ML FOR IR/CATH LAB
INTRA_ARTERIAL | Status: AC
  Filled 2020-02-09: qty 10

## 2020-02-09 MED ORDER — MIDAZOLAM HCL 2 MG/2ML IJ SOLN
INTRAMUSCULAR | Status: AC
  Filled 2020-02-09: qty 2

## 2020-02-09 MED ORDER — FENTANYL CITRATE (PF) 100 MCG/2ML IJ SOLN
INTRAMUSCULAR | Status: AC | PRN
  Administered 2020-02-09: 25 ug via INTRAVENOUS

## 2020-02-09 MED ORDER — ATORVASTATIN CALCIUM 40 MG PO TABS
40.0000 mg | ORAL_TABLET | Freq: Every day | ORAL | Status: DC
  Administered 2020-02-09 – 2020-02-13 (×5): 40 mg via ORAL
  Filled 2020-02-09: qty 4
  Filled 2020-02-09 (×4): qty 1

## 2020-02-09 MED ORDER — LIDOCAINE HCL 1 % IJ SOLN
INTRAMUSCULAR | Status: AC
  Filled 2020-02-09: qty 20

## 2020-02-09 MED ORDER — TICAGRELOR 90 MG PO TABS
180.0000 mg | ORAL_TABLET | Freq: Once | ORAL | Status: AC
  Administered 2020-02-09: 180 mg via ORAL
  Filled 2020-02-09: qty 2

## 2020-02-09 MED ORDER — HEPARIN SODIUM (PORCINE) 1000 UNIT/ML IJ SOLN
INTRAMUSCULAR | Status: AC
  Filled 2020-02-09: qty 1

## 2020-02-09 MED ORDER — MIDAZOLAM HCL 2 MG/2ML IJ SOLN
INTRAMUSCULAR | Status: AC | PRN
  Administered 2020-02-09: 1 mg via INTRAVENOUS

## 2020-02-09 MED ORDER — TICAGRELOR 90 MG PO TABS
90.0000 mg | ORAL_TABLET | Freq: Two times a day (BID) | ORAL | Status: DC
  Administered 2020-02-09 – 2020-02-13 (×9): 90 mg via ORAL
  Filled 2020-02-09 (×9): qty 1

## 2020-02-09 NOTE — Procedures (Signed)
S/P 4 vessel cerebral arteriogram RT Radial approach. Findings. 1.High grade stenosis of distal basilar artery. S.Leoncio Hansen MD

## 2020-02-09 NOTE — Progress Notes (Signed)
Pt has bleeding at the nose. Pressure is being applied.  and requested an anti-anxiety medication. MD notified

## 2020-02-09 NOTE — Progress Notes (Signed)
RN has removed 3cc of air. Bilateral Level 0, +2 pulses, Warm   02/09/20 1522  Vitals  BP (!) 98/59  MAP (mmHg) 72  BP Location Left Arm  BP Method Automatic  Patient Position (if appropriate) Lying  Pulse Rate Source Dinamap  Level of Consciousness  Level of Consciousness Alert  MEWS COLOR  MEWS Score Color Green  Oxygen Therapy  SpO2 98 %  O2 Device Nasal Cannula  O2 Flow Rate (L/min) 3 L/min  MEWS Score  MEWS Temp 0  MEWS Systolic 1  MEWS Pulse 0  MEWS RR 0  MEWS LOC 0  MEWS Score 1

## 2020-02-09 NOTE — TOC Initial Note (Signed)
Transition of Care [facility]) - Initial/Assessment Note    Patient Details  Name: Brenda [name] MRN: [id number] Date of Birth: September 01, 1960  Transition of Care [facility] CM/SW Contact:    Pollie Friar, RN Phone Number: 02/09/2020, 12:34 PM  Clinical Narrative:                 Plan is for angiogram today. CM met with the patient about the recommendations for [facility] services. CM provided choice and she selected [facility] who she has used in the past. CM awaiting call to see if [facility] can accept the referral.  Pt states her nebulizer machine at home is broken and her PcP was ordering her a new one that has not arrived at her home. CM reached out to [facility] with [employer] and they have the orders and will deliver the nebulizer machine to the pts room.  Pt requesting covid vaccine here in the hospital. CM updated MD who was in agreement with her receiving the vaccine. CM then updated the vaccine team and pt has received her first shot.  Pt states she lives with adult children and has 24 hour supervision at home.  TOC following for further d/c needs.   Expected Discharge Plan: [place] Barriers to Discharge: Continued Medical Work up   Patient Goals and CMS Choice   CMS Medicare.gov Compare Post Acute Care list provided to:: Patient Choice offered to / list presented to : Patient  Expected Discharge Plan and Services Expected Discharge Plan: [place]   Discharge Planning Services: CM Consult Post Acute Care Choice: [place] arrangements for the past 2 months: Apartment                           HH Arranged: PT, OT          Prior Living Arrangements/Services Living arrangements for the past 2 months: Apartment Lives with:: Adult Children Patient language and need for interpreter reviewed:: Yes Do you feel safe going back to the place where you live?: Yes      Need for Family Participation in Patient Care: Yes (Comment) Care giver support system in  place?: Yes (comment) Current home services: DME (oxygen through [employer] CPAP/ shower seat and cane) Criminal Activity/Legal Involvement Pertinent to Current Situation/Hospitalization: No - Comment as needed  Activities of Daily Living Home Assistive Devices/Equipment: Cane (specify quad or straight), CBG Meter, Shower chair with back ADL Screening (condition at time of admission) Patient's cognitive ability adequate to safely complete daily activities?: Yes Is the patient deaf or have difficulty hearing?: No Does the patient have difficulty seeing, even when wearing glasses/contacts?: No Does the patient have difficulty concentrating, remembering, or making decisions?: No Patient able to express need for assistance with ADLs?: Yes Does the patient have difficulty dressing or bathing?: No Independently performs ADLs?: Yes (appropriate for developmental age) Does the patient have difficulty walking or climbing stairs?: No Weakness of Legs: None Weakness of Arms/Hands: None  Permission Sought/Granted                  Emotional Assessment Appearance:: Appears stated age Attitude/Demeanor/Rapport: Engaged Affect (typically observed): Accepting Orientation: : Oriented to Self, Oriented to Place, Oriented to  Time, Oriented to Situation   Psych Involvement: No (comment)  Admission diagnosis:  TIA (transient ischemic attack) [G45.9] Leukocytosis [D72.829] Patient Active Problem List   Diagnosis Date Noted  . TIA (transient ischemic attack) 02/08/2020  .  Chronic combined systolic and diastolic CHF (congestive heart failure) ([place]) 02/08/2020  . Elevated troponin 02/04/2020  . COPD (chronic obstructive pulmonary disease) ([place]) 01/30/2020  . LFT elevation   . Abnormal appearance of cervix 08/07/2019  . Chronic kidney disease, stage 3a 08/07/2019  . Knee mass, right 11/28/2018  . Advanced care planning/counseling discussion 07/27/2018  . Unilateral primary osteoarthritis, left knee  10/28/2017  . COPD  GOLD II/III still smoking/ 02 dep  10/20/2017  . Cor pulmonale (chronic) ([place]) 09/16/2017  . Diastolic dysfunction 36/62/9476  . Edema 08/04/2017  . Grief 02/24/2017  . Chronic respiratory failure with hypoxia ([place]) 07/31/2015  . Type 2 diabetes, controlled, with peripheral neuropathy ([place])   . Acute respiratory failure with hypoxia ([place]) 07/20/2015  . COPD exacerbation ([place]) 09/14/2014  . OSA on CPAP 09/14/2014  . Health maintenance examination 09/14/2014  . Upper airway cough syndrome 09/22/2013  . Chronic pain syndrome 05/30/2013  . Migraine, unspecified, without mention of intractable migraine without mention of status migrainosus 05/30/2013  . Pyelonephritis 05/05/2012  . Asthmatic bronchitis 02/27/2008  . Leukocytosis 02/22/2008  . NONSPECIFIC ABNORMAL ELECTROCARDIOGRAM 02/22/2008  . HYPERTENSION, BENIGN ESSENTIAL 06/23/2007  . HYPERTRIGLYCERIDEMIA 06/01/2007  . Morbid obesity with BMI of 40.0-44.9, adult ([place]) 06/01/2007  . Adjustment disorder with anxiety 06/01/2007  . Cigarette smoker 06/01/2007  . Left leg swelling 05/04/2007   PCP:  Ria Bush, MD Pharmacy:   [id number] [place] [id number]: [phone]: [phone]    Social Determinants of Health (SDOH) Interventions    Readmission Risk Interventions No flowsheet data found.

## 2020-02-09 NOTE — Progress Notes (Signed)
RN removed 3cc of air. Level 0. Bilateral +2, Warm   02/09/20 1441  Vitals  BP (!) 106/55  MAP (mmHg) 70  BP Location Left Arm  BP Method Automatic  Patient Position (if appropriate) Lying  Pulse Rate Source Dinamap  Resp 16  Level of Consciousness  Level of Consciousness Alert  MEWS COLOR  MEWS Score Color Green  Oxygen Therapy  SpO2 100 %  O2 Device Nasal Cannula  O2 Flow Rate (L/min) 3 L/min  MEWS Score  MEWS Temp 0  MEWS Systolic 0  MEWS Pulse 0  MEWS RR 0  MEWS LOC 0  MEWS Score 0

## 2020-02-09 NOTE — Progress Notes (Signed)
RN removed 3cc of air.

## 2020-02-09 NOTE — Progress Notes (Signed)
PT Cancellation Note  Patient Details Name: DENELDA AKERLEY MRN: [id number] DOB: [birth year]   Cancelled Treatment:    Reason Eval/Treat Not Completed: On initial attempt, pt off unit. On second attempt, pt back in her room but politely declining. Pt tearful and asking for some time to rest before working with PT. I have her the option of PT coming back this afternoon or another day. Per pt request we will check back at another date. Will continue to follow.    Marylynn Pearson 02/09/2020, 2:44 PM  Conni Slipper, PT, DPT Acute Rehabilitation Services Pager: [phone] Office: [phone]

## 2020-02-09 NOTE — Care Management Obs Status (Signed)
MEDICARE OBSERVATION STATUS NOTIFICATION   Patient Details  Name: Brenda [name] MRN: [id number] Date of Birth: [birth year]   Medicare Observation Status Notification Given:  Yes    Kermit Balo, RN 02/09/2020, 10:28 AM

## 2020-02-09 NOTE — Progress Notes (Signed)
   Covid-19 Vaccination Clinic  Name:  RAINN ZUPKO    MRN: [id number] DOB: January 21, 1961  02/09/2020  Ms. Fritchman was observed post Covid-19 immunization for 15 minutes without incident. She was provided with Vaccine Information Sheet and instruction to access the V-Safe system.   Ms. Choma was instructed to call 911 with any severe reactions post vaccine: Marland Kitchen Difficulty breathing  . Swelling of face and throat  . A fast heartbeat  . A bad rash all over body  . Dizziness and weakness

## 2020-02-09 NOTE — Progress Notes (Signed)
Left sticky note for physician about patient wanting to receive Covid vaccination while inpatient.  Need to know if she is medically cleared to have this performed at this time.  If patient has clearance from DR. Please contact [email or web address] by email to schedule a time and date for this to occur while patient is currently hospitalized.

## 2020-02-09 NOTE — Progress Notes (Addendum)
STROKE TEAM PROGRESS NOTE   SUBJECTIVE (INTERVAL HISTORY) RN is at the bedside to give her COVID vaccine shot.  Patient neurologically intact. BP soft 115/60, no neuro changes. On IVF. Had IR procedure with Dr. Corliss Skains showed high grade stenosis 90-95% of BA. Will plan for angioplasty with stenting likely next week. Will put her on ASA and brilinta.   OBJECTIVE Temp:  [98.3 F (36.8 C)-98.8 F (37.1 C)] 98.3 F (36.8 C) (09/10 0404) [name] Rate:  [65-82] 69 (09/10 0404) Cardiac Rhythm: Normal sinus rhythm (09/09 2023) Resp:  [14-26] 19 (09/10 0404) BP: (90-123)/(53-82) 112/60 (09/10 0404) SpO2:  [94 %-100 %] 100 % (09/10 0404) Weight:  [95.9 kg] 95.9 kg (09/09 2136)  Recent Labs  Lab 02/08/20 0101 02/08/20 0750 02/08/20 1215 02/08/20 1759 02/08/20 2252  GLUCAP 162* 112* 108* 106* 157*   Recent Labs  Lab 02/02/20 1446 02/02/20 1446 02/03/20 1919 02/04/20 0645 02/07/20 2053 02/07/20 2055  NA 139  --  138 139 138 137  K 5.2*  --  5.2* 4.6 4.4 4.2  CL 101  --  103 102 98 100  CO2 25  --  25 27 28   --   GLUCOSE 151*  --  190* 126* 138* 139*  BUN 25*  --  24* 24* 21 24*  CREATININE 1.16*  --  1.10* 1.11* 1.38* 1.50*  CALCIUM 9.2   < > 9.2 9.3 9.5  --    < > = values in this interval not displayed.   Recent Labs  Lab 02/02/20 1446 02/03/20 1919 02/04/20 0645 02/07/20 2053  AST 166* 57* 35 23  ALT 924* 561* 457* 126*  ALKPHOS 86 87 76 88  BILITOT 0.9 0.5 0.7 0.5  PROT 6.6 6.5 6.5 7.0  ALBUMIN 3.3* 3.2* 3.1* 3.5   Recent Labs  Lab 02/02/20 1446 02/03/20 1919 02/04/20 0645 02/07/20 2053 02/07/20 2055  WBC 12.0* 10.9* 16.8* 18.0*  --   NEUTROABS 10.0* 9.0* 10.4* 10.9*  --   HGB 12.7 12.9 13.1 14.4 15.3*  HCT 40.2 39.9 40.5 44.6 45.0  MCV 93.7 91.7 92.3 92.9  --   PLT 140* 183 192 277  --    No results for input(s): CKTOTAL, CKMB, CKMBINDEX, TROPONINI in the last 168 hours. Recent Labs    02/07/20 2053  LABPROT 12.8  INR 1.0   Recent Labs     02/08/20 0919  COLORURINE YELLOW  LABSPEC >1.046*  PHURINE 6.0  GLUCOSEU NEGATIVE  HGBUR NEGATIVE  BILIRUBINUR NEGATIVE  KETONESUR NEGATIVE  PROTEINUR NEGATIVE  NITRITE NEGATIVE  LEUKOCYTESUR NEGATIVE       Component Value Date/Time   CHOL 117 02/09/2020 0214   TRIG 139 02/09/2020 0214   HDL 37 (L) 02/09/2020 0214   CHOLHDL 3.2 02/09/2020 0214   VLDL 28 02/09/2020 0214   LDLCALC 52 02/09/2020 0214   Lab Results  Component Value Date   HGBA1C 6.2 (H) 01/30/2020      Component Value Date/Time   LABOPIA NONE DETECTED 02/08/2020 0919   COCAINSCRNUR NONE DETECTED 02/08/2020 0919   LABBENZ NONE DETECTED 02/08/2020 0919   AMPHETMU NONE DETECTED 02/08/2020 0919   THCU POSITIVE (A) 02/08/2020 0919   LABBARB NONE DETECTED 02/08/2020 0919    Recent Labs  Lab 02/07/20 2053  ETH <10    I have personally reviewed the radiological images below and agree with the radiology interpretations.  CT Angio Head W or Wo Contrast  Result Date: 02/07/2020 CLINICAL DATA:  Left-sided weakness, code stroke follow-up  EXAM: CT ANGIOGRAPHY HEAD AND NECK TECHNIQUE: Multidetector CT imaging of the head and neck was performed using the standard protocol during bolus administration of intravenous contrast. Multiplanar CT image reconstructions and MIPs were obtained to evaluate the vascular anatomy. Carotid stenosis measurements (when applicable) are obtained utilizing NASCET criteria, using the distal internal carotid diameter as the denominator. CONTRAST:  80mL OMNIPAQUE IOHEXOL 350 MG/ML SOLN COMPARISON:  None. FINDINGS: CTA NECK Aortic arch: Great vessel origins are patent with mild calcified plaque. Right carotid system: Patent. Patent mixed but primarily noncalcified plaque at the ICA origin causing less than 50% stenosis. Left carotid system: Patent. Mild calcified plaque at the ICA origin causing less than 50% stenosis. Vertebral arteries: Patent. Right vertebral artery is dominant. Left vertebral  artery origin is not well seen due to artifact. Skeleton: Multilevel degenerative changes of the cervical spine. Other neck: No mass or adenopathy. Upper chest: No apical lung mass. Review of the MIP images confirms the above findings CTA HEAD Anterior circulation: Intracranial internal carotid arteries are patent. Posterior circulation: Intracranial right vertebral artery is patent. Patent right PICA origin. Intracranial left vertebral artery becomes diminutive with high-grade stenosis or occlusion distally. Basilar artery is patent. Small left AICA appears to be present. There is high-grade stenosis near the superior cerebellar origins. The superior cerebellar artery origins are poorly opacified with reconstitution. Bilateral posterior communicating arteries are present. There is fetal origin of the right PCA. Venous sinuses: Patent as allowed by contrast bolus timing. Review of the MIP images confirms the above findings IMPRESSION: No large vessel occlusion. Plaque at the ICA origins causes less than 50% stenosis. Small caliber nondominant left vertebral artery becomes diminutive intracranially with high-grade stenosis or occlusion distally. A left PICA origin is not identified and may be congenitally absent. A small left AICA appears to be present. Focal high-grade stenosis of the basilar artery at the superior cerebellar artery origins. These results were called by telephone at the time of interpretation on 02/07/2020 at 9:39 pm to provider Dr. Iver NestleBhagat, who verbally acknowledged these results. Electronically Signed   By: Guadlupe SpanishPraneil  Patel M.D.   On: 02/07/2020 21:41   CT Angio Neck W and/or Wo Contrast  Result Date: 02/07/2020 CLINICAL DATA:  Left-sided weakness, code stroke follow-up EXAM: CT ANGIOGRAPHY HEAD AND NECK TECHNIQUE: Multidetector CT imaging of the head and neck was performed using the standard protocol during bolus administration of intravenous contrast. Multiplanar CT image reconstructions and MIPs  were obtained to evaluate the vascular anatomy. Carotid stenosis measurements (when applicable) are obtained utilizing NASCET criteria, using the distal internal carotid diameter as the denominator. CONTRAST:  80mL OMNIPAQUE IOHEXOL 350 MG/ML SOLN COMPARISON:  None. FINDINGS: CTA NECK Aortic arch: Great vessel origins are patent with mild calcified plaque. Right carotid system: Patent. Patent mixed but primarily noncalcified plaque at the ICA origin causing less than 50% stenosis. Left carotid system: Patent. Mild calcified plaque at the ICA origin causing less than 50% stenosis. Vertebral arteries: Patent. Right vertebral artery is dominant. Left vertebral artery origin is not well seen due to artifact. Skeleton: Multilevel degenerative changes of the cervical spine. Other neck: No mass or adenopathy. Upper chest: No apical lung mass. Review of the MIP images confirms the above findings CTA HEAD Anterior circulation: Intracranial internal carotid arteries are patent. Posterior circulation: Intracranial right vertebral artery is patent. Patent right PICA origin. Intracranial left vertebral artery becomes diminutive with high-grade stenosis or occlusion distally. Basilar artery is patent. Small left AICA appears to be present.  There is high-grade stenosis near the superior cerebellar origins. The superior cerebellar artery origins are poorly opacified with reconstitution. Bilateral posterior communicating arteries are present. There is fetal origin of the right PCA. Venous sinuses: Patent as allowed by contrast bolus timing. Review of the MIP images confirms the above findings IMPRESSION: No large vessel occlusion. Plaque at the ICA origins causes less than 50% stenosis. Small caliber nondominant left vertebral artery becomes diminutive intracranially with high-grade stenosis or occlusion distally. A left PICA origin is not identified and may be congenitally absent. A small left AICA appears to be present. Focal  high-grade stenosis of the basilar artery at the superior cerebellar artery origins. These results were called by telephone at the time of interpretation on 02/07/2020 at 9:39 pm to provider Dr. Iver Nestle, who verbally acknowledged these results. Electronically Signed   By: Guadlupe Spanish M.D.   On: 02/07/2020 21:41   CT CHEST WO CONTRAST  Result Date: 01/31/2020 CLINICAL DATA:  Respiratory distress, COPD exacerbation EXAM: CT CHEST WITHOUT CONTRAST TECHNIQUE: Multidetector CT imaging of the chest was performed following the standard protocol without IV contrast. COMPARISON:  Radiograph 01/30/2020, CT 09/07/2017 FINDINGS: Cardiovascular: Normal cardiac size. No pericardial effusion. Few scattered coronary artery calcifications are present. Atherosclerotic calcifications present in the normal caliber thoracic aorta and proximal great vessels which branch normally from the aortic arch. No periaortic stranding or hemorrhage. Central pulmonary arteries are normal caliber. Luminal evaluation precluded in the absence of contrast media. No major venous abnormalities are seen. Mediastinum/Nodes: No mediastinal fluid or gas. Normal thyroid gland and thoracic inlet. No acute abnormality of the trachea or esophagus. No worrisome mediastinal or axillary adenopathy.Few prominent paratracheal nodes are present but not significantly changed from prior including a 9 mm lymph node with preserved fatty hilum (3/20). Hilar nodal evaluation is limited in the absence of intravenous contrast media. Lungs/Pleura: There is diffuse mild airways thickening. Mixed ground-glass, consolidation and tree-in-bud nodularity is present predominantly in the right lower lobe and posterior segment right upper lobe to a lesser extent. Some mild interlobular septal and fissural thickening is noted as well with trace bilateral effusions. Central pulmonary vascular congestion is present with redistributed pulmonary vascularity. Additional bandlike areas of  opacity may reflect atelectatic change or scarring. No pneumothorax. Few scattered calcified granulomata. No concerning pulmonary nodules or masses within the limitations of parenchymal disease though follow-up to resolution should be considered. Upper Abdomen: Patient appears to be post cholecystectomy. Interposition of the hepatic flexure anterior to the liver. Stable lobular thickening of the left adrenal gland. Musculoskeletal: Multilevel degenerative changes are present in the imaged portions of the spine. Some sclerotic likely Modic type endplate changes noted throughout the mid to lower thoracic levels. No acute or concerning osseous lesions. No worrisome chest wall masses or lesions. Mild body wall edema is noted. IMPRESSION: 1. Mixed ground-glass, consolidation and tree-in-bud nodularity predominantly in the right lower lobe and to a lesser extent the posterior segment right upper lobe. Findings could reflect an acute multifocal infection or inflammatory process or sequela of aspiration. 2. Additional superimposed features of septal thickening and vascular redistribution/congestion with trace effusions may suggest some superimposed edematous change as well. 3. Mild body wall edema. 4. Aortic Atherosclerosis (ICD10-I70.0). 5. Coronary artery calcifications are present. Please note that the presence of coronary artery calcium documents the presence of coronary artery disease, the severity of this disease and any potential stenosis cannot be assessed on this non-gated CT examination. Electronically Signed   By: Kreg Shropshire  M.D.   On: 01/31/2020 23:56   MR BRAIN WO CONTRAST  Result Date: 02/08/2020 CLINICAL DATA:  Initial evaluation for acute double vision, left-sided weakness, speech difficulty. EXAM: MRI HEAD WITHOUT CONTRAST TECHNIQUE: Multiplanar, multiecho [name] sequences of the brain and surrounding structures were obtained without intravenous contrast. COMPARISON:  Prior CTs from 02/07/2020.  FINDINGS: Brain: Examination degraded by motion artifact. Cerebral volume within normal limits for age. Few scattered foci of T2/FLAIR hyperintensity noted involving the supratentorial cerebral white matter, nonspecific, but felt to be within normal limits for age and of doubtful significance. No abnormal foci of restricted diffusion to suggest acute or subacute ischemia. Gray-white matter differentiation maintained. No encephalomalacia to suggest chronic cortical infarction. No foci of susceptibility artifact to suggest acute or chronic intracranial hemorrhage. No mass lesion, midline shift or mass effect. No hydrocephalus or extra-axial fluid collection. Incidental note made of an empty sella. Midline structures intact. Vascular: Major intracranial vascular flow voids are maintained. Skull and upper cervical spine: Craniocervical junction within normal limits. No visible focal marrow replacing lesion. No scalp soft tissue abnormality. Sinuses/Orbits: Globes and orbital soft tissues within normal limits. Mild scattered mucosal thickening noted within the ethmoidal air cells. Paranasal sinuses are otherwise clear. Small right greater than left mastoid effusions, of doubtful significance. Inner ear structures grossly normal. Visualized nasopharynx within normal limits. Other: None. IMPRESSION: 1. No acute intracranial abnormality. 2. Empty sella. While this finding is often incidental in nature and of no clinical significance, this can also be seen in the setting of idiopathic intracranial hypertension. 3. Otherwise unremarkable and normal brain MRI for age. Electronically Signed   By: Rise Mu M.D.   On: 02/08/2020 02:39   DG CHEST PORT 1 VIEW  Result Date: 02/08/2020 CLINICAL DATA:  Code stroke patient. Double vision, left-sided weakness and difficulty getting words out. History of asthma, hypertension, bronchitis, COPD. EXAM: PORTABLE CHEST 1 VIEW COMPARISON:  01/30/2020 FINDINGS: Shallow  inspiration. Heart size and pulmonary vascularity are normal. Mild infiltrates in the lung bases with improvement since previous study. No pleural effusions. No pneumothorax. Mediastinal contours appear intact. IMPRESSION: Mild infiltrates in the lung bases with improvement since previous study. Electronically Signed   By: Burman Nieves M.D.   On: 02/08/2020 00:31   DG Chest Portable 1 View  Result Date: 01/30/2020 CLINICAL DATA:  Shortness of breath.  Potential COVID. EXAM: PORTABLE CHEST 1 VIEW COMPARISON:  05/30/2019. FINDINGS: Borderline cardiomegaly. No pulmonary venous congestion. Diffuse prominent bilateral interstitial prominence itis. No pleural effusion or pneumothorax. IMPRESSION: 1. Diffuse prominent bilateral interstitial prominence consistent with pneumonitis. 2.  Borderline cardiomegaly.  No pulmonary venous congestion. Electronically Signed   By: Maisie Fus  Register   On: 01/30/2020 11:09   DG Swallowing Func-Speech Pathology  Result Date: 02/02/2020 Objective Swallowing Evaluation: Type of Study: MBS-Modified Barium Swallow Study  Patient Details Name: Brenda [name] MRN: [id number] Date of Birth: 1961-01-10 Today's Date: 02/02/2020 Time: SLP Start Time (ACUTE ONLY): 0949 -SLP Stop Time (ACUTE ONLY): 1003 SLP Time Calculation (min) (ACUTE ONLY): 14 min Past Medical History: Past Medical History: Diagnosis Date . Acute pancreatitis  . Asthma   main dyspnea thought due to deconditioning (10/2013) . Bipolar I disorder, most recent episode (or current) manic, unspecified   pt denies this . Bronchitis, chronic obstructive (HCC) 2008 & 2009  FeV1 64% TLC 105% DLCO 55% 2008  -  FeV1 81% FeF 25-75 43% 2009 . COPD (chronic obstructive pulmonary disease) (HCC)   emphysema, not an issue per pulm (  10/2013) . History of pyelonephritis 05/2012 . HTN (hypertension)  . Hx of migraines  . Knee pain, right  . Leukocytosis, unspecified  . Lumbar disc disease with radiculopathy   multilevel spondylitic changes,  scoliosis, and anterolisthesis L4 not thought to currently be surg candidate (Dr. Phoebe Perch, [facility]) (Dr. Ollen Bowl, [place]) . Nicotine addiction  . Nonspecific abnormal electrocardiogram (ECG) (EKG)  . Obesity  . Pure hyperglyceridemia  . Suicide attempt (HCC) 06/2001 . Swelling of limb  . Urge and stress incontinence 07/2012  ([facility]) Past Surgical History: Past Surgical History: Procedure Laterality Date . CHOLECYSTECTOMY  07/1982 . CT MAXILLOFACIAL WO/W CM  06/14/06  Left max mucocele . CT of chest  06/14/2006  Normal except c/w active inflammation / infection.  Left renal atrophy . ERCP / sphincterotomy - stenosis  01/15/06 . lumbar MRI  11/2010  multilevel spondylitic changes with upper lumbar scoliosis and anterolisthesis of L4 on 5 with some L foraminal narrowing esp at L/3 with concavity of scoliosis, some central stenosis and biforaminal narrowing at L4/5 with spondylolisthesis . [facility] - Pneumonia, acute asthma, left maxillary sinusitis  01/11 - 06/18/2006 . Retained stone    ERCP secondary to retained stone . Right knee surgery  1984,1989,1989,1991,1994  Reconstructions for ACL insuff . TUBAL LIGATION   HPI: Pt is a 59 y.o. female with medical history significant of chronic hypoxic respiratory failure on 2 to 3 L baseline O2 and CPAP at bedtime, COPD Gold stage III (FEV1 44%, improved with bronchodilator to 50%, 2019), chronic cigarette smoker 5-10 cigarettes a day hypertension, OSA on CPAP at bedtime, IIDM, diabetic neuropathy, chronic pain, depression/anxiety, morbid obesity who presented to the ED with increasing short of breath. Rapid response on 9/1 due to respiratory distress and was placed on non rebreather mask. CT chest 9/1: Mixed ground-glass, consolidation and tree-in-bud nodularity predominantly in the right lower lobe and to a lesser extent the posterior segment right upper lobe. Findings could reflect an acute multifocal infection or inflammatory process or sequela of aspiration. Pt reported  "getting choked on food often" to Dr. Katrinka Blazing.  No data recorded Assessment / Plan / Recommendation CHL IP CLINICAL IMPRESSIONS 02/02/2020 Clinical Impression Pt's oropharyngeal swallow mechanism was within functional limits with transient penetration (PAS 2) of thin liquids when consecutive swallows were used. Esophageal screening revealed backflow of material from the lower to upper thoracic esophagus and mild esophageal stasis. Backflow to the pharynx was not observed while the fluoro was on. However, pt demonstrated subsequent coughing and the possibility of aspiration secondary to backflowed material reaching the cervical esophagus and pharynx is suspected. Considering the results of the esophageal screening combined with her reported symptoms, assessment (e.g., esophagram) of the esophageal phase of her swallow is recommended. Pt was educated regarding results and recommendations as well as strategies to reduce aspiration risk during episodes of dyspnea. She verbalized understanding. Further skilled SLP services are not clinically indicated at this time.  SLP Visit Diagnosis Dysphagia, unspecified (R13.10) Attention and concentration deficit following -- Frontal lobe and executive function deficit following -- Impact on safety and function Mild aspiration risk   CHL IP TREATMENT RECOMMENDATION 02/02/2020 Treatment Recommendations No treatment recommended at this time   Prognosis 02/02/2020 Prognosis for Safe Diet Advancement Good Barriers to Reach Goals -- Barriers/Prognosis Comment -- CHL IP DIET RECOMMENDATION 02/02/2020 SLP Diet Recommendations Regular solids;Thin liquid Liquid Administration via Cup;Straw Medication Administration Whole meds with liquid Compensations Slow rate;Small sips/bites Postural Changes --   No flowsheet data found.  CHL  IP FOLLOW UP RECOMMENDATIONS 02/02/2020 Follow up Recommendations None   CHL IP FREQUENCY AND DURATION 02/02/2020 Speech Therapy Frequency (ACUTE ONLY) min 2x/week Treatment  Duration 1 week      CHL IP ORAL PHASE 02/02/2020 Oral Phase WFL Oral - Pudding Teaspoon -- Oral - Pudding Cup -- Oral - Honey Teaspoon -- Oral - Honey Cup -- Oral - Nectar Teaspoon -- Oral - Nectar Cup -- Oral - Nectar Straw -- Oral - Thin Teaspoon -- Oral - Thin Cup -- Oral - Thin Straw -- Oral - Puree -- Oral - Mech Soft -- Oral - Regular -- Oral - Multi-Consistency -- Oral - Pill -- Oral Phase - Comment --  CHL IP PHARYNGEAL PHASE 02/02/2020 Pharyngeal Phase WFL Pharyngeal- Pudding Teaspoon -- Pharyngeal -- Pharyngeal- Pudding Cup -- Pharyngeal -- Pharyngeal- Honey Teaspoon -- Pharyngeal -- Pharyngeal- Honey Cup -- Pharyngeal -- Pharyngeal- Nectar Teaspoon -- Pharyngeal -- Pharyngeal- Nectar Cup -- Pharyngeal -- Pharyngeal- Nectar Straw -- Pharyngeal -- Pharyngeal- Thin Teaspoon -- Pharyngeal -- Pharyngeal- Thin Cup -- Pharyngeal -- Pharyngeal- Thin Straw Penetration/Aspiration during swallow Pharyngeal Material enters airway, remains ABOVE vocal cords then ejected out Pharyngeal- Puree -- Pharyngeal -- Pharyngeal- Mechanical Soft -- Pharyngeal -- Pharyngeal- Regular -- Pharyngeal -- Pharyngeal- Multi-consistency -- Pharyngeal -- Pharyngeal- Pill -- Pharyngeal -- Pharyngeal Comment --  CHL IP CERVICAL ESOPHAGEAL PHASE 02/02/2020 Cervical Esophageal Phase (No Data) Pudding Teaspoon -- Pudding Cup -- Honey Teaspoon -- Honey Cup -- Nectar Teaspoon -- Nectar Cup -- Nectar Straw -- Thin Teaspoon -- Thin Cup -- Thin Straw -- Puree -- Mechanical Soft -- Regular -- Multi-consistency -- Pill -- Cervical Esophageal Comment -- Shanika I. Vear Clock, MS, CCC-SLP Acute Rehabilitation Services Office number [phone] Pager [phone] Scheryl Marten 02/02/2020, 10:28 AM              ECHOCARDIOGRAM COMPLETE  Result Date: 01/31/2020    ECHOCARDIOGRAM REPORT   Patient Name:   Brenda [name] Date of Exam: 01/31/2020 Medical Rec #:  [id number]     Height:       61.0 in Accession #:    [id number]    Weight:       216.0 lb Date of  Birth:  07-14-1960     BSA:          1.952 m Patient Age:    59 years      BP:           102/66 mmHg Patient Gender: F             HR:           71 bpm. Exam Location:  Inpatient Procedure: 2D Echo Indications:    dyspnea  History:        Patient has no prior history of Echocardiogram examinations.                 COPD; Risk Factors:Hypertension and Current Smoker.  Sonographer:    Celene Skeen RDCS (AE) Referring Phys: [id number] [facility] General  Sonographer Comments: Image acquisition challenging due to patient body habitus, Image acquisition challenging due to respiratory motion and Image acquisition challenging due to COPD. IMPRESSIONS  1. Technically difficult study, even with Definity contrast. There is no left ventricular thrombus. There is relatively preserved systolic function in the basal segments. Findings suggest stress cardiomyopathy (takotsubo sd.), but could well represent multivessel CAD. Left ventricular ejection fraction, by estimation, is 30 to 35%. The left ventricle has moderate to severely decreased function. The  left ventricle demonstrates global hypokinesis. The left ventricular internal cavity size was mildly dilated. Left ventricular diastolic parameters are consistent with Grade II diastolic dysfunction (pseudonormalization). Elevated left atrial pressure.  2. Right ventricular systolic function was not well visualized. The right ventricular size is normal. Tricuspid regurgitation signal is inadequate for assessing PA pressure.  3. The mitral valve is grossly normal. No evidence of mitral valve regurgitation. No evidence of mitral stenosis.  4. The aortic valve is grossly normal. Aortic valve regurgitation is not visualized. No aortic stenosis is present. Comparison(s): Prior images reviewed side by side. The left ventricular function is significantly worse. The left ventricular wall motion abnormalities are new. Consider serial studies, with Definity. FINDINGS  Left Ventricle: Technically  difficult study, even with Definity contrast. There is no left ventricular thrombus. There is relatively preserved systolic function in the basal segments. Findings suggest stress cardiomyopathy (takotsubo sd.), but could well represent multivessel CAD. Left ventricular ejection fraction, by estimation, is 30 to 35%. The left ventricle has moderate to severely decreased function. The left ventricle demonstrates global hypokinesis. Definity contrast agent was given IV to delineate the left ventricular endocardial borders. The left ventricular internal cavity size was mildly dilated. There is no left ventricular hypertrophy. Left ventricular diastolic parameters are consistent with Grade II diastolic dysfunction (pseudonormalization). Elevated left atrial pressure. Right Ventricle: The right ventricular size is normal. No increase in right ventricular wall thickness. Right ventricular systolic function was not well visualized. Tricuspid regurgitation signal is inadequate for assessing PA pressure. Left Atrium: Left atrial size was normal in size. Right Atrium: Right atrial size was normal in size. Pericardium: There is no evidence of pericardial effusion. Mitral Valve: The mitral valve is grossly normal. No evidence of mitral valve regurgitation. No evidence of mitral valve stenosis. Tricuspid Valve: The tricuspid valve is normal in structure. Tricuspid valve regurgitation is not demonstrated. Aortic Valve: The aortic valve is grossly normal. Aortic valve regurgitation is not visualized. No aortic stenosis is present. Pulmonic Valve: The pulmonic valve was not well visualized. Pulmonic valve regurgitation is not visualized. Aorta: The aortic root is normal in size and structure. IAS/Shunts: No atrial level shunt detected by color flow Doppler.  LEFT VENTRICLE PLAX 2D LVIDd:         5.50 cm  Diastology LVIDs:         4.20 cm  LV e' lateral:   7.51 cm/s LV PW:         0.90 cm  LV E/e' lateral: 11.5 LV IVS:        1.10  cm LVOT diam:     2.40 cm LV SV:         80 LV SV Index:   41 LVOT Area:     4.52 cm  LEFT ATRIUM           Index LA diam:      3.70 cm 1.90 cm/m LA Vol (A4C): 47.7 ml 24.44 ml/m  AORTIC VALVE LVOT Vmax:   96.60 cm/s LVOT Vmean:  70.600 cm/s LVOT VTI:    0.176 m  AORTA Ao Root diam: 2.70 cm MITRAL VALVE MV Area (PHT): 3.54 cm    SHUNTS MV Decel Time: 214 msec    Systemic VTI:  0.18 m MV E velocity: 86.50 cm/s  Systemic Diam: 2.40 cm MV A velocity: 72.00 cm/s MV E/A ratio:  1.20 Mihai Croitoru MD Electronically signed by Thurmon Fair MD Signature Date/Time: 01/31/2020/4:22:37 PM    Final    ECHOCARDIOGRAM  LIMITED BUBBLE STUDY  Result Date: 02/08/2020    ECHOCARDIOGRAM REPORT   Patient Name:   Brenda [name] Date of Exam: 02/08/2020 Medical Rec #:  [id number]     Height:       61.5 in Accession #:    [id number]    Weight:       221.5 lb Date of Birth:  May 07, 1961     BSA:          1.985 m Patient Age:    59 years      BP:           111/71 mmHg Patient Gender: F             HR:           68 bpm. Exam Location:  Inpatient Procedure: Limited Echo, Cardiac Doppler, Color Doppler, Saline Contrast Bubble            Study and Intracardiac Opacification Agent Indications:    TIA  History:        Patient has prior history of Echocardiogram examinations, most                 recent 01/31/2020. CHF, COPD; Risk Factors:Sleep Apnea and                 Diabetes. CKD.  Sonographer:    Ross Ludwig RDCS (AE) Referring Phys: [id number] TIMOTHY S OPYD  Sonographer Comments: Patient is morbidly obese. Image acquisition challenging due to patient body habitus and Image acquisition challenging due to COPD. IMPRESSIONS  1. Left ventricular ejection fraction, by estimation, is 40 to 45%. The left ventricle has mildly decreased function. The left ventricle demonstrates global hypokinesis. There is mild left ventricular hypertrophy. Left ventricular diastolic parameters are indeterminate.  2. Right ventricular systolic function was not well  visualized. The right ventricular size is not well visualized.  3. The mitral valve is grossly normal. No evidence of mitral valve regurgitation. No evidence of mitral stenosis.  4. The aortic valve is grossly normal. Aortic valve regurgitation is not visualized. No aortic stenosis is present.  5. Aortic dilatation noted. There is mild dilatation of the ascending aorta, measuring 39 mm. Comparison(s): Changes from prior study are noted. LVEF somewhat improved. FINDINGS  Left Ventricle: Left ventricular ejection fraction, by estimation, is 40 to 45%. The left ventricle has mildly decreased function. The left ventricle demonstrates global hypokinesis. Definity contrast agent was given IV to delineate the left ventricular  endocardial borders. The left ventricular internal cavity size was normal in size. There is mild left ventricular hypertrophy. Left ventricular diastolic parameters are indeterminate. Right Ventricle: The right ventricular size is not well visualized. Right vetricular wall thickness was not assessed. Right ventricular systolic function was not well visualized. Left Atrium: Left atrial size was not well visualized. Right Atrium: Right atrial size was not well visualized. Pericardium: There is no evidence of pericardial effusion. Mitral Valve: The mitral valve is grossly normal. No evidence of mitral valve regurgitation. No evidence of mitral valve stenosis. Tricuspid Valve: The tricuspid valve is grossly normal. Tricuspid valve regurgitation is trivial. No evidence of tricuspid stenosis. Aortic Valve: The aortic valve is grossly normal. Aortic valve regurgitation is not visualized. No aortic stenosis is present. Aortic valve mean gradient measures 5.0 mmHg. Aortic valve peak gradient measures 10.8 mmHg. Aortic valve area, by VTI measures  2.31 cm. Pulmonic Valve: The pulmonic valve was not well visualized. Pulmonic valve regurgitation is not visualized. Aorta: Aortic dilatation noted.  There is mild  dilatation of the ascending aorta, measuring 39 mm. IAS/Shunts: The interatrial septum was not well visualized. Agitated saline contrast was given intravenously to evaluate for intracardiac shunting.  LEFT VENTRICLE PLAX 2D LVIDd:         4.60 cm  Diastology LVIDs:         2.90 cm  LV e' medial:    5.33 cm/s LV PW:         1.30 cm  LV E/e' medial:  15.0 LV IVS:        1.20 cm  LV e' lateral:   4.57 cm/s LVOT diam:     2.20 cm  LV E/e' lateral: 17.5 LV SV:         62 LV SV Index:   31 LVOT Area:     3.80 cm  IVC IVC diam: 2.20 cm LEFT ATRIUM         Index LA diam:    3.40 cm 1.71 cm/m  AORTIC VALVE AV Area (Vmax):    2.19 cm AV Area (Vmean):   2.22 cm AV Area (VTI):     2.31 cm AV Vmax:           164.00 cm/s AV Vmean:          99.000 cm/s AV VTI:            0.267 m AV Peak Grad:      10.8 mmHg AV Mean Grad:      5.0 mmHg LVOT Vmax:         94.60 cm/s LVOT Vmean:        57.900 cm/s LVOT VTI:          0.162 m LVOT/AV VTI ratio: 0.61  AORTA Ao Root diam: 3.10 cm Ao Asc diam:  3.90 cm MITRAL VALVE MV Area (PHT): 2.37 cm    SHUNTS MV Decel Time: 320 msec    Systemic VTI:  0.16 m MV E velocity: 80.10 cm/s  Systemic Diam: 2.20 cm MV A velocity: 69.40 cm/s MV E/A ratio:  1.15 Jodelle Red MD Electronically signed by Jodelle Red MD Signature Date/Time: 02/08/2020/10:46:37 PM    Final    CT HEAD CODE STROKE WO CONTRAST  Result Date: 02/07/2020 CLINICAL DATA:  Code stroke.  Left-sided weakness EXAM: CT HEAD WITHOUT CONTRAST TECHNIQUE: Contiguous axial images were obtained from the base of the skull through the vertex without intravenous contrast. COMPARISON:  None. FINDINGS: Brain: There is no acute intracranial hemorrhage, mass effect, edema, or loss of gray-white differentiation. Ventricles and sulci are normal in size and configuration. There is no extra-axial collection. Vascular: No hyperdense vessel. Atherosclerotic calcification is present at the skull base. Skull: Unremarkable. Sinuses/Orbits:  Patchy ethmoid opacification. Orbits are unremarkable. Other: Mastoid air cells are clear. ASPECTS ([place] Stroke Program Early CT Score) - Ganglionic level infarction (caudate, lentiform nuclei, internal capsule, insula, M1-M3 cortex): 7 - Supraganglionic infarction (M4-M6 cortex): 3 Total score (0-10 with 10 being normal): 10 IMPRESSION: No acute intracranial hemorrhage or evidence of acute infarction. ASPECT score is 10. These results were communicated to Dr. Iver Nestle at 9:10 pm on 02/07/2020 by text page via the [facility] messaging system. Electronically Signed   By: Guadlupe Spanish M.D.   On: 02/07/2020 21:13   US Abdomen Limited RUQ  Result Date: 01/30/2020 CLINICAL DATA:  Elevated liver enzymes. EXAM: ULTRASOUND ABDOMEN LIMITED RIGHT UPPER QUADRANT COMPARISON:  01/11/2006 abdominal ultrasound. FINDINGS: Gallbladder: Surgically absent. Common bile duct: Diameter: 2.4 mm Liver: No  focal lesion identified. Increased parenchymal echogenicity. Portal vein is patent on color Doppler imaging with normal direction of blood flow towards the liver. Other: None. IMPRESSION: Hepatic steatosis.  No focal hepatic lesions. Sequela of cholecystectomy. Electronically Signed   By: Stana Bunting M.D.   On: 01/30/2020 16:50   DG ESOPHAGUS W SINGLE CM (SOL OR THIN BA)  Result Date: 02/02/2020 CLINICAL DATA:  Shortness of breath weakness. EXAM: ESOPHOGRAM/BARIUM SWALLOW TECHNIQUE: Single contrast examination was performed using  thin barium. FLUOROSCOPY TIME:  Fluoroscopy Time:  1 minutes 42 seconds Radiation Exposure Index (if provided by the fluoroscopic device): 14.3 mGy Number of Acquired Spot Images: 0 COMPARISON:  Chest CT of 01/31/2020 FINDINGS: Esophagus grossly normal. Limited assessment in semi recumbent positioning as the patient could not stand for the evaluation. Distensibility is normal. Mild deviation near the cardiac silhouette likely due to LEFT atrial enlargement based on comparison with recent CT. Motility  could not be fully assessed as the patient was unable to lie in the prone RAO position due to current dyspnea. However, on the last swallow obtained there was proximal escape of the bolus with head of bed slightly elevated in moderate tertiary peristaltic activity compatible with esophageal dysmotility. IMPRESSION: 1. Limited study without abnormality aside from signs of mild-to-moderate dysmotility. If there are continued symptoms that would suggest esophageal pathology a study as an outpatient could be performed for more complete assessment when the patient has recovered from this episode of acute illness. Electronically Signed   By: Donzetta Kohut M.D.   On: 02/02/2020 13:39    PHYSICAL EXAM  Temp:  [98.3 F (36.8 C)-98.8 F (37.1 C)] 98.3 F (36.8 C) (09/10 0404) [name] Rate:  [65-82] 69 (09/10 0404) Resp:  [14-26] 19 (09/10 0404) BP: (90-123)/(53-82) 112/60 (09/10 0404) SpO2:  [94 %-100 %] 100 % (09/10 0404) Weight:  [95.9 kg] 95.9 kg (09/09 2136)  General - Well nourished, well developed, in no apparent distress.  Ophthalmologic - fundi not visualized due to noncooperation.  Cardiovascular - Regular rhythm and rate.  Mental Status -  Level of arousal and orientation to time, place, and person were intact. Language including expression, naming, repetition, comprehension was assessed and found intact. Fund of Knowledge was assessed and was intact.  Cranial Nerves II - XII - II - Visual field intact OU. III, IV, VI - Extraocular movements intact. V - Facial sensation intact bilaterally. VII - Facial movement intact bilaterally. VIII - Hearing & vestibular intact bilaterally. X - Palate elevates symmetrically. XI - Chin turning & shoulder shrug intact bilaterally. XII - Tongue protrusion intact.  Motor Strength - The patient's strength was normal in all extremities and pronator drift was absent.  Bulk was normal and fasciculations were absent.   Motor Tone - Muscle tone was  assessed at the neck and appendages and was normal.  Reflexes - The patient's reflexes were symmetrical in all extremities and she had no pathological reflexes.  Sensory - Light touch, temperature/pinprick were assessed and were symmetrical.    Coordination - The patient had normal movements in the hands and feet with no ataxia or dysmetria.  Tremor was absent.  Gait and Station - deferred.   ASSESSMENT/PLAN Brenda [name] is a 59 y.o. female with history of hypertension, hyperlipidemia, diabetes, COPD, OSA on CPAP, morbid obesity, current smoker admitted for episode of left-sided weakness, nausea vomiting, slurred speech and double vision. No tPA given due to also window and symptom resolved.    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    CTA head and neck mid basilar artery stenosis.  Left VA hypoplastic versus stenosis.  Atherosclerosis bilateral ICA bulbs.  MRI no acute infarct  IR high grade stenosis of BA  2D Echo EF 40-45%, 01/31/2020 EF 30 to 35%  LDL 52  HgbA1c 6.2  UDS positive for THC  Lovenox for VTE prophylaxis  aspirin 81 mg daily prior to admission, now on aspirin 81mg  and brilinta 90mg  bid after load in preparation for BA stenting next week.   Patient counseled to be compliant with her antithrombotic medications  Ongoing aggressive stroke risk factor management  Therapy recommendations: Pending  Disposition: Pending  Cardiomyopathy Recent NSTEMI  Admitted 8/31-9/5 for COPD exacerbation, RSV pneumonia and respiratory failure  Found to have non-STEMI and EF 30 to 35%  Was treated with heparin drip and aspirin  Discharged with aspirin statin and metoprolol as well as losartan  This admission 2D echo EF 40-45%, improved  AKI  Creatinine 1.1->1.38->1.50->pending  On IV fluid  Diabetes  HgbA1c 6.2 goal < 7.0  Controlled  CBG monitoring  SSI  DM education and close PCP follow  up  Hypotension History of hypertension . BP soft 100-120s . BP goal 120-150 given based artery stenosis . On IV fluid . Avoid low BP  Hyperlipidemia  Home meds: Lipitor 20  LDL 52, goal < 70  Now on Lipitor 40  Continue statin at discharge  Tobacco abuse  Current smoker  Smoking cessation counseling provided  Pt is willing to quit  Other Stroke Risk Factors  Morbid obesity, BMI 41.17  Obstructive sleep apnea, on CPAP at home  THC abuse, cessation education provided  Other Active Problems  Frequent COPD exacerbation  Leukocytosis WBC 10.9->16.8->18.0->pending  Hospital day # 0  Case discussed with Dr. and Dr. 06-04-1972.   Blake Divine, MD PhD Stroke Neurology 02/09/2020 2:45 PM       To contact Stroke Continuity provider, please refer to [facility] Plan. After hours, contact General Neurology

## 2020-02-09 NOTE — Progress Notes (Signed)
RN has removed 3cc of air. Bilateral Level , warm, +2 pulses   02/09/20 1500  Vitals  BP (!) 98/52  MAP (mmHg) 67  BP Location Left Arm  BP Method Automatic  Patient Position (if appropriate) Lying  Pulse Rate Source Dinamap  Resp 18  Level of Consciousness  Level of Consciousness Alert  MEWS COLOR  MEWS Score Color Green  Oxygen Therapy  SpO2 100 %  O2 Device Nasal Cannula  O2 Flow Rate (L/min) 3 L/min  MEWS Score  MEWS Temp 0  MEWS Systolic 1  MEWS Pulse 0  MEWS RR 0  MEWS LOC 0  MEWS Score 1

## 2020-02-09 NOTE — Telephone Encounter (Signed)
Faxed requested info to number provided.

## 2020-02-09 NOTE — Progress Notes (Signed)
PROGRESS NOTE    Brenda [name]  TDH:[id number] DOB: 03-Sep-1960 DOA: 02/07/2020 PCP: [name], MD   Chief Complaint  Patient presents with  . Code Stroke    Brief Narrative:  59 year old lady with prior history of COPD, chronic hypoxic respiratory failure on 3 L of nasal cannula oxygen at home, chronic chronic combined CHF, obstructive sleep apnea on CPAP, type 2 diabetes mellitus, stage IIIa CKD, recent admission for NSTEMI, presents to ED with left-sided weakness, slurred speech and nausea and vomiting.  Initial CT of the head without contrast negative for acute hemorrhage or infarction.  CT angiogram of the head and neck showed basilar artery stenosis.  Neurology was consulted, IR was consulted and underwent cerebral angiogram today showing high grade stenosis of the basilar artery. She will probably need stent placement.   Assessment & Plan:   Principal Problem:   TIA (transient ischemic attack) Active Problems:   Leukocytosis   OSA on CPAP   Type 2 diabetes, controlled, with peripheral neuropathy (HCC)   Chronic respiratory failure with hypoxia (HCC)   COPD  GOLD II/III still smoking/ 02 dep    Chronic kidney disease, stage 3a   LFT elevation   Chronic combined systolic and diastolic CHF (congestive heart failure) (HCC)   Posterior circulation TIA probably secondary to basilar artery stenosis. CT head with contrast does not  show any acute finding.  CT angiogram of the head and neck Focal high-grade stenosis of the basilar artery at the superior cerebellar artery origins.  IR consulted and plan for cerebral angiogram later today. Aspirin and plavix daily, following Plavix bolus. Continue with lipitor 20 mg daily.  Further TIA work-up with echocardiogram and MRI of the brain ordered. PT/OT/SLP evaluations ordered. LDL is 52  And /hemoglobin A1c is 6.2% Urine tox positive for tetrahydrocannabinol. She underwent cerebral angiogram today showing high grade stenosis of  the basilar artery. She will probably need stent placement.  Appreciate IR recommendations.    History of chronic combined systolic and diastolic heart failure Patient appears to be compensated and is on 3 L of nasal cannula oxygen.  Repeat echocardiogram reviewed.    Obstructive sleep apnea on CPAP at night continue the same.   Chronic respiratory failure with hypoxia on 3 L of nasal cannula oxygen Probably secondary to obstructive sleep apnea, COPD,, chronic, and heart failure.  Stage III CKD Creatinine appears to be at baseline at this time.    Leukocytosis WBC count at 18,000, patient remains afebrile.  Recheck CBC in am.  Covid screening test is negative.  Urine analysis and chest x-ray are negative for infection.   Type 2 DM:  CBG (last 3)  Recent Labs    02/08/20 2252 02/09/20 0605 02/09/20 1142  GLUCAP 157* 105* 107*   Resume SSI. Hemoglobin A1c  Is 6.2%   COPD No wheezing heard.  Resume bronchodilators.    Pt reports Nose bleeding from dryness. Nasal spray ordered.    Anxiety Resume ativan.   DVT prophylaxis: Lovenox Code Status: Full code Family Communication: None at bedside Disposition:   Status is: Observation  The patient will require care spanning > 2 midnights and should be moved to inpatient because: Ongoing diagnostic testing needed not appropriate for outpatient work up, Unsafe d/c plan and Inpatient level of care appropriate due to severity of illness  Dispo: The patient is from: Home              Anticipated d/c is to: Home  Anticipated d/c date is: 3 days              Patient currently is not medically stable to d/c.       Consultants:   Neurology  IR  Procedures: (CT angiogram of the head and neck MRI of the brain Echocardiogram Cerebral angiogram  Antimicrobials: None  Subjective: Pt anxious, reports some nasal bleeding.   Objective: Vitals:   02/09/20 1325 02/09/20 1335 02/09/20 1441 02/09/20 1500    BP: 120/71 115/60 (!) 106/55 (!) 98/52  Pulse: 72 64    Resp: 13 14 16    Temp:   98 F (36.7 C)   TempSrc:   Oral   SpO2: 95% 97% 100%   Weight:      Height:        Intake/Output Summary (Last 24 hours) at 02/09/2020 1519 Last data filed at 02/09/2020 0100 Gross per 24 hour  Intake 735 ml  Output --  Net 735 ml   Filed Weights   02/08/20 2136  Weight: 95.9 kg    Examination:  General exam: Alert and comfortable. Not in distress.  Respiratory system: Clear to auscultation, no wheezing or rhonchi. On 3 lit of [place] oxygen.  Cardiovascular system: S1S2, RRR, no JVD,  Gastrointestinal system: Abdomen is soft, non tender non distended,bowel sounds wnl.  Central nervous system: alert and oriented, grossly non focal.  Extremities: no cyanosis or clubbing.  Skin: No rashes seen.  Psychiatry: Mood is appropriate.     Data Reviewed: I have personally reviewed following labs and imaging studies  CBC: Recent Labs  Lab 02/03/20 1919 02/04/20 0645 02/07/20 2053 02/07/20 2055  WBC 10.9* 16.8* 18.0*  --   NEUTROABS 9.0* 10.4* 10.9*  --   HGB 12.9 13.1 14.4 15.3*  HCT 39.9 40.5 44.6 45.0  MCV 91.7 92.3 92.9  --   PLT 183 192 277  --     Basic Metabolic Panel: Recent Labs  Lab 02/03/20 1919 02/04/20 0645 02/07/20 2053 02/07/20 2055  NA 138 139 138 137  K 5.2* 4.6 4.4 4.2  CL 103 102 98 100  CO2 25 27 28   --   GLUCOSE 190* 126* 138* 139*  BUN 24* 24* 21 24*  CREATININE 1.10* 1.11* 1.38* 1.50*  CALCIUM 9.2 9.3 9.5  --     GFR: Estimated Creatinine Clearance: 41.7 mL/min (A) (by C-G formula based on SCr of 1.5 mg/dL (H)).  Liver Function Tests: Recent Labs  Lab 02/03/20 1919 02/04/20 0645 02/07/20 2053  AST 57* 35 23  ALT 561* 457* 126*  ALKPHOS 87 76 88  BILITOT 0.5 0.7 0.5  PROT 6.5 6.5 7.0  ALBUMIN 3.2* 3.1* 3.5    CBG: Recent Labs  Lab 02/08/20 1215 02/08/20 1759 02/08/20 2252 02/09/20 0605 02/09/20 1142  GLUCAP 108* 106* 157* 105* 107*      Recent Results (from the past 240 hour(s))  Expectorated sputum assessment w rflx to resp cult     Status: None   Collection Time: 01/31/20  4:48 AM   Specimen: Expectorated Sputum  Result Value Ref Range Status   Specimen Description EXPECTORATED SPUTUM  Final   Special Requests NONE  Final   Sputum evaluation   Final    Sputum specimen not acceptable for testing.  Please recollect.   RESULT CALLED TO, READ BACK BY AND VERIFIED WITH: C GOAD 01/31/20 0557 JDW Performed at [facility], [address] [place], [address] [place]    Report Status 01/31/2020  FINAL  Final  MRSA PCR Screening     Status: None   Collection Time: 01/31/20  8:46 PM   Specimen: Nasal Mucosa; Nasopharyngeal  Result Value Ref Range Status   MRSA by PCR NEGATIVE NEGATIVE Final    Comment:        The GeneXpert MRSA Assay (FDA approved for NASAL specimens only), is one component of a comprehensive MRSA colonization surveillance program. It is not intended to diagnose MRSA infection nor to guide or monitor treatment for MRSA infections. Performed at [facility], [address] [place], [place] [address]   Respiratory Panel by PCR     Status: Abnormal   Collection Time: 02/02/20  8:57 PM   Specimen: Nasopharyngeal Swab; Respiratory  Result Value Ref Range Status   Adenovirus NOT DETECTED NOT DETECTED Final   Coronavirus 229E NOT DETECTED NOT DETECTED Final    Comment: (NOTE) The Coronavirus on the Respiratory Panel, DOES NOT test for the novel  Coronavirus (2019 nCoV)    Coronavirus HKU1 NOT DETECTED NOT DETECTED Final   Coronavirus NL63 NOT DETECTED NOT DETECTED Final   Coronavirus OC43 NOT DETECTED NOT DETECTED Final   Metapneumovirus NOT DETECTED NOT DETECTED Final   Rhinovirus / Enterovirus NOT DETECTED NOT DETECTED Final   Influenza A NOT DETECTED NOT DETECTED Final   Influenza B NOT DETECTED NOT DETECTED Final   Parainfluenza Virus 1 NOT DETECTED NOT DETECTED Final    Parainfluenza Virus 2 NOT DETECTED NOT DETECTED Final   Parainfluenza Virus 3 NOT DETECTED NOT DETECTED Final   Parainfluenza Virus 4 NOT DETECTED NOT DETECTED Final   Respiratory Syncytial Virus DETECTED (A) NOT DETECTED Final   Bordetella pertussis NOT DETECTED NOT DETECTED Final   Chlamydophila pneumoniae NOT DETECTED NOT DETECTED Final   Mycoplasma pneumoniae NOT DETECTED NOT DETECTED Final    Comment: Performed at [facility], [address] [place], [place] [address]  SARS Coronavirus 2 by RT PCR (hospital order, performed in [facility] hospital lab) Nasopharyngeal Nasopharyngeal Swab     Status: None   Collection Time: 02/07/20  1:35 AM   Specimen: Nasopharyngeal Swab  Result Value Ref Range Status   SARS Coronavirus 2 NEGATIVE NEGATIVE Final    Comment: (NOTE) SARS-CoV-2 target nucleic acids are NOT DETECTED.  The SARS-CoV-2 RNA is generally detectable in upper and lower respiratory specimens during the acute phase of infection. The lowest concentration of SARS-CoV-2 viral copies this assay can detect is 250 copies / mL. A negative result does not preclude SARS-CoV-2 infection and should not be used as the sole basis for treatment or other patient management decisions.  A negative result may occur with improper specimen collection / handling, submission of specimen other than nasopharyngeal swab, presence of viral mutation(s) within the areas targeted by this assay, and inadequate number of viral copies (<250 copies / mL). A negative result must be combined with clinical observations, patient history, and epidemiological information.  Fact Sheet for Patients:   BoilerBrush.com.cy  Fact Sheet for Healthcare Providers: https://pope.com/  This test is not yet approved or  cleared by the [place] FDA and has been authorized for detection and/or diagnosis of SARS-CoV-2 by FDA under an Emergency Use Authorization  (EUA).  This EUA will remain in effect (meaning this test can be used) for the duration of the COVID-19 declaration under Section 564(b)(1) of the Act, 21 U.S.C. section 360bbb-3(b)(1), unless the authorization is terminated or revoked sooner.  Performed at [facility]  Lab, [address] [place], [place] [address]          Radiology Studies: CT Angio Head W or Wo Contrast  Result Date: 02/07/2020 CLINICAL DATA:  Left-sided weakness, code stroke follow-up EXAM: CT ANGIOGRAPHY HEAD AND NECK TECHNIQUE: Multidetector CT imaging of the head and neck was performed using the standard protocol during bolus administration of intravenous contrast. Multiplanar CT image reconstructions and MIPs were obtained to evaluate the vascular anatomy. Carotid stenosis measurements (when applicable) are obtained utilizing NASCET criteria, using the distal internal carotid diameter as the denominator. CONTRAST:  80mL OMNIPAQUE IOHEXOL 350 MG/ML SOLN COMPARISON:  None. FINDINGS: CTA NECK Aortic arch: Great vessel origins are patent with mild calcified plaque. Right carotid system: Patent. Patent mixed but primarily noncalcified plaque at the ICA origin causing less than 50% stenosis. Left carotid system: Patent. Mild calcified plaque at the ICA origin causing less than 50% stenosis. Vertebral arteries: Patent. Right vertebral artery is dominant. Left vertebral artery origin is not well seen due to artifact. Skeleton: Multilevel degenerative changes of the cervical spine. Other neck: No mass or adenopathy. Upper chest: No apical lung mass. Review of the MIP images confirms the above findings CTA HEAD Anterior circulation: Intracranial internal carotid arteries are patent. Posterior circulation: Intracranial right vertebral artery is patent. Patent right PICA origin. Intracranial left vertebral artery becomes diminutive with high-grade stenosis or occlusion distally. Basilar artery is patent. Small left AICA appears to  be present. There is high-grade stenosis near the superior cerebellar origins. The superior cerebellar artery origins are poorly opacified with reconstitution. Bilateral posterior communicating arteries are present. There is fetal origin of the right PCA. Venous sinuses: Patent as allowed by contrast bolus timing. Review of the MIP images confirms the above findings IMPRESSION: No large vessel occlusion. Plaque at the ICA origins causes less than 50% stenosis. Small caliber nondominant left vertebral artery becomes diminutive intracranially with high-grade stenosis or occlusion distally. A left PICA origin is not identified and may be congenitally absent. A small left AICA appears to be present. Focal high-grade stenosis of the basilar artery at the superior cerebellar artery origins. These results were called by telephone at the time of interpretation on 02/07/2020 at 9:39 pm to provider Dr. Iver Nestle, who verbally acknowledged these results. Electronically Signed   By: Guadlupe Spanish M.D.   On: 02/07/2020 21:41   CT Angio Neck W and/or Wo Contrast  Result Date: 02/07/2020 CLINICAL DATA:  Left-sided weakness, code stroke follow-up EXAM: CT ANGIOGRAPHY HEAD AND NECK TECHNIQUE: Multidetector CT imaging of the head and neck was performed using the standard protocol during bolus administration of intravenous contrast. Multiplanar CT image reconstructions and MIPs were obtained to evaluate the vascular anatomy. Carotid stenosis measurements (when applicable) are obtained utilizing NASCET criteria, using the distal internal carotid diameter as the denominator. CONTRAST:  80mL OMNIPAQUE IOHEXOL 350 MG/ML SOLN COMPARISON:  None. FINDINGS: CTA NECK Aortic arch: Great vessel origins are patent with mild calcified plaque. Right carotid system: Patent. Patent mixed but primarily noncalcified plaque at the ICA origin causing less than 50% stenosis. Left carotid system: Patent. Mild calcified plaque at the ICA origin causing less  than 50% stenosis. Vertebral arteries: Patent. Right vertebral artery is dominant. Left vertebral artery origin is not well seen due to artifact. Skeleton: Multilevel degenerative changes of the cervical spine. Other neck: No mass or adenopathy. Upper chest: No apical lung mass. Review of the MIP images confirms the above findings CTA HEAD Anterior circulation: Intracranial internal carotid  arteries are patent. Posterior circulation: Intracranial right vertebral artery is patent. Patent right PICA origin. Intracranial left vertebral artery becomes diminutive with high-grade stenosis or occlusion distally. Basilar artery is patent. Small left AICA appears to be present. There is high-grade stenosis near the superior cerebellar origins. The superior cerebellar artery origins are poorly opacified with reconstitution. Bilateral posterior communicating arteries are present. There is fetal origin of the right PCA. Venous sinuses: Patent as allowed by contrast bolus timing. Review of the MIP images confirms the above findings IMPRESSION: No large vessel occlusion. Plaque at the ICA origins causes less than 50% stenosis. Small caliber nondominant left vertebral artery becomes diminutive intracranially with high-grade stenosis or occlusion distally. A left PICA origin is not identified and may be congenitally absent. A small left AICA appears to be present. Focal high-grade stenosis of the basilar artery at the superior cerebellar artery origins. These results were called by telephone at the time of interpretation on 02/07/2020 at 9:39 pm to provider Dr. Iver NestleBhagat, who verbally acknowledged these results. Electronically Signed   By: Guadlupe SpanishPraneil  Patel M.D.   On: 02/07/2020 21:41   MR BRAIN WO CONTRAST  Result Date: 02/08/2020 CLINICAL DATA:  Initial evaluation for acute double vision, left-sided weakness, speech difficulty. EXAM: MRI HEAD WITHOUT CONTRAST TECHNIQUE: Multiplanar, multiecho pulse sequences of the brain and  surrounding structures were obtained without intravenous contrast. COMPARISON:  Prior CTs from 02/07/2020. FINDINGS: Brain: Examination degraded by motion artifact. Cerebral volume within normal limits for age. Few scattered foci of T2/FLAIR hyperintensity noted involving the supratentorial cerebral white matter, nonspecific, but felt to be within normal limits for age and of doubtful significance. No abnormal foci of restricted diffusion to suggest acute or subacute ischemia. Gray-white matter differentiation maintained. No encephalomalacia to suggest chronic cortical infarction. No foci of susceptibility artifact to suggest acute or chronic intracranial hemorrhage. No mass lesion, midline shift or mass effect. No hydrocephalus or extra-axial fluid collection. Incidental note made of an empty sella. Midline structures intact. Vascular: Major intracranial vascular flow voids are maintained. Skull and upper cervical spine: Craniocervical junction within normal limits. No visible focal marrow replacing lesion. No scalp soft tissue abnormality. Sinuses/Orbits: Globes and orbital soft tissues within normal limits. Mild scattered mucosal thickening noted within the ethmoidal air cells. Paranasal sinuses are otherwise clear. Small right greater than left mastoid effusions, of doubtful significance. Inner ear structures grossly normal. Visualized nasopharynx within normal limits. Other: None. IMPRESSION: 1. No acute intracranial abnormality. 2. Empty sella. While this finding is often incidental in nature and of no clinical significance, this can also be seen in the setting of idiopathic intracranial hypertension. 3. Otherwise unremarkable and normal brain MRI for age. Electronically Signed   By: Rise MuBenjamin  McClintock M.D.   On: 02/08/2020 02:39   DG CHEST PORT 1 VIEW  Result Date: 02/08/2020 CLINICAL DATA:  Code stroke patient. Double vision, left-sided weakness and difficulty getting words out. History of asthma,  hypertension, bronchitis, COPD. EXAM: PORTABLE CHEST 1 VIEW COMPARISON:  01/30/2020 FINDINGS: Shallow inspiration. Heart size and pulmonary vascularity are normal. Mild infiltrates in the lung bases with improvement since previous study. No pleural effusions. No pneumothorax. Mediastinal contours appear intact. IMPRESSION: Mild infiltrates in the lung bases with improvement since previous study. Electronically Signed   By: Burman NievesWilliam  Stevens M.D.   On: 02/08/2020 00:31   ECHOCARDIOGRAM LIMITED BUBBLE STUDY  Result Date: 02/08/2020    ECHOCARDIOGRAM REPORT   Patient Name:   Brenda [name] Date of Exam: 02/08/2020 Medical  Rec #:  [id number]     Height:       61.5 in Accession #:    [id number]    Weight:       221.5 lb Date of Birth:  [birth year]     BSA:          1.985 m Patient Age:    59 years      BP:           111/71 mmHg Patient Gender: F             HR:           68 bpm. Exam Location:  Inpatient Procedure: Limited Echo, Cardiac Doppler, Color Doppler, Saline Contrast Bubble            Study and Intracardiac Opacification Agent Indications:    TIA  History:        Patient has prior history of Echocardiogram examinations, most                 recent 01/31/2020. CHF, COPD; Risk Factors:Sleep Apnea and                 Diabetes. CKD.  Sonographer:    Ross Ludwig RDCS (AE) Referring Phys: [id number] TIMOTHY S OPYD  Sonographer Comments: Patient is morbidly obese. Image acquisition challenging due to patient body habitus and Image acquisition challenging due to COPD. IMPRESSIONS  1. Left ventricular ejection fraction, by estimation, is 40 to 45%. The left ventricle has mildly decreased function. The left ventricle demonstrates global hypokinesis. There is mild left ventricular hypertrophy. Left ventricular diastolic parameters are indeterminate.  2. Right ventricular systolic function was not well visualized. The right ventricular size is not well visualized.  3. The mitral valve is grossly normal. No evidence of mitral valve  regurgitation. No evidence of mitral stenosis.  4. The aortic valve is grossly normal. Aortic valve regurgitation is not visualized. No aortic stenosis is present.  5. Aortic dilatation noted. There is mild dilatation of the ascending aorta, measuring 39 mm. Comparison(s): Changes from prior study are noted. LVEF somewhat improved. FINDINGS  Left Ventricle: Left ventricular ejection fraction, by estimation, is 40 to 45%. The left ventricle has mildly decreased function. The left ventricle demonstrates global hypokinesis. Definity contrast agent was given IV to delineate the left ventricular  endocardial borders. The left ventricular internal cavity size was normal in size. There is mild left ventricular hypertrophy. Left ventricular diastolic parameters are indeterminate. Right Ventricle: The right ventricular size is not well visualized. Right vetricular wall thickness was not assessed. Right ventricular systolic function was not well visualized. Left Atrium: Left atrial size was not well visualized. Right Atrium: Right atrial size was not well visualized. Pericardium: There is no evidence of pericardial effusion. Mitral Valve: The mitral valve is grossly normal. No evidence of mitral valve regurgitation. No evidence of mitral valve stenosis. Tricuspid Valve: The tricuspid valve is grossly normal. Tricuspid valve regurgitation is trivial. No evidence of tricuspid stenosis. Aortic Valve: The aortic valve is grossly normal. Aortic valve regurgitation is not visualized. No aortic stenosis is present. Aortic valve mean gradient measures 5.0 mmHg. Aortic valve peak gradient measures 10.8 mmHg. Aortic valve area, by VTI measures  2.31 cm. Pulmonic Valve: The pulmonic valve was not well visualized. Pulmonic valve regurgitation is not visualized. Aorta: Aortic dilatation noted. There is mild dilatation of the ascending aorta, measuring 39 mm. IAS/Shunts: The interatrial septum was not well visualized. Agitated saline  contrast was given intravenously  to evaluate for intracardiac shunting.  LEFT VENTRICLE PLAX 2D LVIDd:         4.60 cm  Diastology LVIDs:         2.90 cm  LV e' medial:    5.33 cm/s LV PW:         1.30 cm  LV E/e' medial:  15.0 LV IVS:        1.20 cm  LV e' lateral:   4.57 cm/s LVOT diam:     2.20 cm  LV E/e' lateral: 17.5 LV SV:         62 LV SV Index:   31 LVOT Area:     3.80 cm  IVC IVC diam: 2.20 cm LEFT ATRIUM         Index LA diam:    3.40 cm 1.71 cm/m  AORTIC VALVE AV Area (Vmax):    2.19 cm AV Area (Vmean):   2.22 cm AV Area (VTI):     2.31 cm AV Vmax:           164.00 cm/s AV Vmean:          99.000 cm/s AV VTI:            0.267 m AV Peak Grad:      10.8 mmHg AV Mean Grad:      5.0 mmHg LVOT Vmax:         94.60 cm/s LVOT Vmean:        57.900 cm/s LVOT VTI:          0.162 m LVOT/AV VTI ratio: 0.61  AORTA Ao Root diam: 3.10 cm Ao Asc diam:  3.90 cm MITRAL VALVE MV Area (PHT): 2.37 cm    SHUNTS MV Decel Time: 320 msec    Systemic VTI:  0.16 m MV E velocity: 80.10 cm/s  Systemic Diam: 2.20 cm MV A velocity: 69.40 cm/s MV E/A ratio:  1.15 Jodelle Red MD Electronically signed by Jodelle Red MD Signature Date/Time: 02/08/2020/10:46:37 PM    Final    CT HEAD CODE STROKE WO CONTRAST  Result Date: 02/07/2020 CLINICAL DATA:  Code stroke.  Left-sided weakness EXAM: CT HEAD WITHOUT CONTRAST TECHNIQUE: Contiguous axial images were obtained from the base of the skull through the vertex without intravenous contrast. COMPARISON:  None. FINDINGS: Brain: There is no acute intracranial hemorrhage, mass effect, edema, or loss of gray-white differentiation. Ventricles and sulci are normal in size and configuration. There is no extra-axial collection. Vascular: No hyperdense vessel. Atherosclerotic calcification is present at the skull base. Skull: Unremarkable. Sinuses/Orbits: Patchy ethmoid opacification. Orbits are unremarkable. Other: Mastoid air cells are clear. ASPECTS ([place] Stroke Program Early  CT Score) - Ganglionic level infarction (caudate, lentiform nuclei, internal capsule, insula, M1-M3 cortex): 7 - Supraganglionic infarction (M4-M6 cortex): 3 Total score (0-10 with 10 being normal): 10 IMPRESSION: No acute intracranial hemorrhage or evidence of acute infarction. ASPECT score is 10. These results were communicated to Dr. Iver Nestle at 9:10 pm on 02/07/2020 by text page via the [facility] messaging system. Electronically Signed   By: Guadlupe Spanish M.D.   On: 02/07/2020 21:13        Scheduled Meds: .  stroke: mapping our early stages of recovery book   Does not apply Once  . aspirin EC  81 mg Oral Daily  . atorvastatin  40 mg Oral QHS  . enoxaparin (LOVENOX) injection  40 mg Subcutaneous Daily  . fentaNYL      . fluticasone furoate-vilanterol  1 puff  Inhalation Daily   And  . umeclidinium bromide  1 puff Inhalation Daily  . heparin sodium (porcine)      . insulin aspart  0-5 Units Subcutaneous QHS  . insulin aspart  0-9 Units Subcutaneous TID WC  . lidocaine      . midazolam      . nitroGLYCERIN      . pantoprazole  40 mg Oral Daily  . ticagrelor  180 mg Oral Once   Followed by  . ticagrelor  90 mg Oral BID  . verapamil       Continuous Infusions: . sodium chloride 75 mL/hr at 02/08/20 1235  . sodium chloride    . sodium chloride       LOS: 0 days       Kathlen Mody, MD [facility]   To contact the attending provider between 7A-7P or the covering provider during after hours 7P-7A, please log into the web site [email or web address] and access using universal [place] password for that web site. If you do not have the password, please call the hospital operator.  02/09/2020, 3:19 PM

## 2020-02-10 LAB — CBC
HCT: 38.1 % (ref 36.0–46.0)
Hemoglobin: 12.1 g/dL (ref 12.0–15.0)
MCH: 29.7 pg (ref 26.0–34.0)
MCHC: 31.8 g/dL (ref 30.0–36.0)
MCV: 93.6 fL (ref 80.0–100.0)
Platelets: 238 10*3/uL (ref 150–400)
RBC: 4.07 MIL/uL (ref 3.87–5.11)
RDW: 13.5 % (ref 11.5–15.5)
WBC: 16.1 10*3/uL — ABNORMAL HIGH (ref 4.0–10.5)
nRBC: 0 % (ref 0.0–0.2)

## 2020-02-10 LAB — BASIC METABOLIC PANEL
Anion gap: 6 (ref 5–15)
BUN: 16 mg/dL (ref 8–23)
CO2: 26 mmol/L (ref 22–32)
Calcium: 9.1 mg/dL (ref 8.9–10.3)
Chloride: 106 mmol/L (ref 98–111)
Creatinine, Ser: 0.89 mg/dL (ref 0.44–1.00)
GFR calc Af Amer: >60 mL/min (ref >60)
GFR calc non Af Amer: >60 mL/min (ref >60)
Glucose, Bld: 109 mg/dL — ABNORMAL HIGH (ref 70–99)
Potassium: 4.1 mmol/L (ref 3.5–5.1)
Sodium: 138 mmol/L (ref 135–145)

## 2020-02-10 LAB — GLUCOSE, CAPILLARY
Glucose-Capillary: 114 mg/dL — ABNORMAL HIGH (ref 70–99)
Glucose-Capillary: 145 mg/dL — ABNORMAL HIGH (ref 70–99)
Glucose-Capillary: 94 mg/dL (ref 70–99)
Glucose-Capillary: 169 mg/dL — ABNORMAL HIGH (ref 70–99)

## 2020-02-10 LAB — HEMOGLOBIN A1C
Hgb A1c MFr Bld: 6.1 % — ABNORMAL HIGH (ref 4.8–5.6)
Mean Plasma Glucose: 128.37 mg/dL

## 2020-02-10 NOTE — Progress Notes (Signed)
STROKE TEAM PROGRESS NOTE   SUBJECTIVE (INTERVAL HISTORY) Patient is sitting up in bed.  She has no complaints today.  Diagnostic cerebral catheter angiogram yesterday by Dr. Corliss Skains her did confirm high-grade distal basilar stenosis and she is scheduled for elective angioplasty stenting on coming Monday.  She has no complaints today.  Hemoglobin A1c is pending.  Echocardiogram showed EF of 40 to 45%.  OBJECTIVE Temp:  [97.9 F (36.6 C)-98.4 F (36.9 C)] 98 F (36.7 C) (09/11 0837) Pulse Rate:  [64-78] 67 (09/11 0837) Cardiac Rhythm: Normal sinus rhythm (09/11 0700) Resp:  [13-25] 16 (09/11 0837) BP: (98-140)/(52-76) 133/67 (09/11 0837) SpO2:  [95 %-100 %] 97 % (09/11 0837)  Recent Labs  Lab 02/08/20 2252 02/09/20 0605 02/09/20 1142 02/09/20 2110 02/10/20 0611  GLUCAP 157* 105* 107* 87 114*   Recent Labs  Lab 02/03/20 1919 02/03/20 1919 02/04/20 0645 02/07/20 2053 02/07/20 2055 02/10/20 0352  NA 138  --  139 138 137 138  K 5.2*  --  4.6 4.4 4.2 4.1  CL 103  --  102 98 100 106  CO2 25  --  27 28  --  26  GLUCOSE 190*  --  126* 138* 139* 109*  BUN 24*  --  24* 21 24* 16  CREATININE 1.10*  --  1.11* 1.38* 1.50* 0.89  CALCIUM 9.2   < > 9.3 9.5  --  9.1   < > = values in this interval not displayed.   Recent Labs  Lab 02/03/20 1919 02/04/20 0645 02/07/20 2053  AST 57* 35 23  ALT 561* 457* 126*  ALKPHOS 87 76 88  BILITOT 0.5 0.7 0.5  PROT 6.5 6.5 7.0  ALBUMIN 3.2* 3.1* 3.5   Recent Labs  Lab 02/03/20 1919 02/04/20 0645 02/07/20 2053 02/07/20 2055 02/10/20 0352  WBC 10.9* 16.8* 18.0*  --  16.1*  NEUTROABS 9.0* 10.4* 10.9*  --   --   HGB 12.9 13.1 14.4 15.3* 12.1  HCT 39.9 40.5 44.6 45.0 38.1  MCV 91.7 92.3 92.9  --  93.6  PLT 183 192 277  --  238   No results for input(s): CKTOTAL, CKMB, CKMBINDEX, TROPONINI in the last 168 hours. Recent Labs    02/07/20 2053  LABPROT 12.8  INR 1.0   Recent Labs    02/08/20 0919  COLORURINE YELLOW  LABSPEC  >1.046*  PHURINE 6.0  GLUCOSEU NEGATIVE  HGBUR NEGATIVE  BILIRUBINUR NEGATIVE  KETONESUR NEGATIVE  PROTEINUR NEGATIVE  NITRITE NEGATIVE  LEUKOCYTESUR NEGATIVE       Component Value Date/Time   CHOL 117 02/09/2020 0214   TRIG 139 02/09/2020 0214   HDL 37 (L) 02/09/2020 0214   CHOLHDL 3.2 02/09/2020 0214   VLDL 28 02/09/2020 0214   LDLCALC 52 02/09/2020 0214   Lab Results  Component Value Date   HGBA1C 6.1 (H) 02/10/2020      Component Value Date/Time   LABOPIA NONE DETECTED 02/08/2020 0919   COCAINSCRNUR NONE DETECTED 02/08/2020 0919   LABBENZ NONE DETECTED 02/08/2020 0919   AMPHETMU NONE DETECTED 02/08/2020 0919   THCU POSITIVE (A) 02/08/2020 0919   LABBARB NONE DETECTED 02/08/2020 0919    Recent Labs  Lab 02/07/20 2053  ETH <10    I have personally reviewed the radiological images below and agree with the radiology interpretations.  CT Angio Head W or Wo Contrast CT Angio Neck W and/or Wo Contrast 02/07/2020 IMPRESSION:  No large vessel occlusion. Plaque at the ICA origins causes less  than 50% stenosis. Small caliber nondominant left vertebral artery becomes diminutive intracranially with high-grade stenosis or occlusion distally. A left PICA origin is not identified and may be congenitally absent. A small left AICA appears to be present. Focal high-grade stenosis of the basilar artery at the superior cerebellar artery origins.   CT CHEST WO CONTRAST 01/31/2020 IMPRESSION:  1. Mixed ground-glass, consolidation and tree-in-bud nodularity predominantly in the right lower lobe and to a lesser extent the posterior segment right upper lobe. Findings could reflect an acute multifocal infection or inflammatory process or sequela of aspiration.  2. Additional superimposed features of septal thickening and vascular redistribution/congestion with trace effusions may suggest some superimposed edematous change as well.  3. Mild body wall edema.  4. Aortic Atherosclerosis  (ICD10-I70.0).  5. Coronary artery calcifications are present. Please note that the presence of coronary artery calcium documents the presence of coronary artery disease, the severity of this disease and any potential stenosis cannot be assessed on this non-gated CT examination.   MR BRAIN WO CONTRAST 02/08/2020 IMPRESSION:  1. No acute intracranial abnormality.  2. Empty sella. While this finding is often incidental in nature and of no clinical significance, this can also be seen in the setting of idiopathic intracranial hypertension.  3. Otherwise unremarkable and normal brain MRI for age.  DG CHEST PORT 1 VIEW 02/08/2020 IMPRESSION:  Mild infiltrates in the lung bases with improvement since previous study.   DG Chest Portable 1 View 01/30/2020 IMPRESSION:  1. Diffuse prominent bilateral interstitial prominence consistent with pneumonitis.  2.  Borderline cardiomegaly.  No pulmonary venous congestion.  DG Swallowing Func-Speech Pathology 02/02/2020 Objective Swallowing Evaluation: Type of Study: MBS-Modified Barium Swallow Study  Patient Details Name: Brenda [name] MRN: [id number] Date of Birth: 1961/05/31 Today's Date: 02/02/2020 Time: SLP Start Time (ACUTE ONLY): 0949 -SLP Stop Time (ACUTE ONLY): 1003 SLP Time Calculation (min) (ACUTE ONLY): 14 min Past Medical History: Past Medical History: Diagnosis Date . Acute pancreatitis  . Asthma   main dyspnea thought due to deconditioning (10/2013) . Bipolar I disorder, most recent episode (or current) manic, unspecified   pt denies this . Bronchitis, chronic obstructive (HCC) 2008 & 2009  FeV1 64% TLC 105% DLCO 55% 2008  -  FeV1 81% FeF 25-75 43% 2009 . COPD (chronic obstructive pulmonary disease) (HCC)   emphysema, not an issue per pulm (10/2013) . History of pyelonephritis 05/2012 . HTN (hypertension)  . Hx of migraines  . Knee pain, right  . Leukocytosis, unspecified  . Lumbar disc disease with radiculopathy   multilevel spondylitic changes, scoliosis, and  anterolisthesis L4 not thought to currently be surg candidate (Dr. Phoebe PerchHirsch, [facility]) (Dr. Ollen BowlHarkins, [facility]) . Nicotine addiction  . Nonspecific abnormal electrocardiogram (ECG) (EKG)  . Obesity  . Pure hyperglyceridemia  . Suicide attempt (HCC) 06/2001 . Swelling of limb  . Urge and stress incontinence 07/2012  ([facility]) Past Surgical History: Past Surgical History: Procedure Laterality Date . CHOLECYSTECTOMY  07/1982 . CT MAXILLOFACIAL WO/W CM  06/14/06  Left max mucocele . CT of chest  06/14/2006  Normal except c/w active inflammation / infection.  Left renal atrophy . ERCP / sphincterotomy - stenosis  01/15/06 . lumbar MRI  11/2010  multilevel spondylitic changes with upper lumbar scoliosis and anterolisthesis of L4 on 5 with some L foraminal narrowing esp at L/3 with concavity of scoliosis, some central stenosis and biforaminal narrowing at L4/5 with spondylolisthesis . [place] - Pneumonia, acute asthma, left maxillary sinusitis  01/11 - 06/18/2006 .  Retained stone    ERCP secondary to retained stone . Right knee surgery  1984,1989,1989,1991,1994  Reconstructions for ACL insuff . TUBAL LIGATION   HPI: Pt is a 59 y.o. female with medical history significant of chronic hypoxic respiratory failure on 2 to 3 L baseline O2 and CPAP at bedtime, COPD Gold stage III (FEV1 44%, improved with bronchodilator to 50%, 2019), chronic cigarette smoker 5-10 cigarettes a day hypertension, OSA on CPAP at bedtime, IIDM, diabetic neuropathy, chronic pain, depression/anxiety, morbid obesity who presented to the ED with increasing short of breath. Rapid response on 9/1 due to respiratory distress and was placed on non rebreather mask. CT chest 9/1: Mixed ground-glass, consolidation and tree-in-bud nodularity predominantly in the right lower lobe and to a lesser extent the posterior segment right upper lobe. Findings could reflect an acute multifocal infection or inflammatory process or sequela of aspiration. Pt reported "getting  choked on food often" to Dr. Katrinka Blazing.  No data recorded Assessment / Plan / Recommendation CHL IP CLINICAL IMPRESSIONS 02/02/2020 Clinical Impression Pt's oropharyngeal swallow mechanism was within functional limits with transient penetration (PAS 2) of thin liquids when consecutive swallows were used. Esophageal screening revealed backflow of material from the lower to upper thoracic esophagus and mild esophageal stasis. Backflow to the pharynx was not observed while the fluoro was on. However, pt demonstrated subsequent coughing and the possibility of aspiration secondary to backflowed material reaching the cervical esophagus and pharynx is suspected. Considering the results of the esophageal screening combined with her reported symptoms, assessment (e.g., esophagram) of the esophageal phase of her swallow is recommended. Pt was educated regarding results and recommendations as well as strategies to reduce aspiration risk during episodes of dyspnea. She verbalized understanding. Further skilled SLP services are not clinically indicated at this time.  SLP Visit Diagnosis Dysphagia, unspecified (R13.10) Attention and concentration deficit following -- Frontal lobe and executive function deficit following -- Impact on safety and function Mild aspiration risk   CHL IP TREATMENT RECOMMENDATION 02/02/2020 Treatment Recommendations No treatment recommended at this time   Prognosis 02/02/2020 Prognosis for Safe Diet Advancement Good Barriers to Reach Goals -- Barriers/Prognosis Comment -- CHL IP DIET RECOMMENDATION 02/02/2020 SLP Diet Recommendations Regular solids;Thin liquid Liquid Administration via Cup;Straw Medication Administration Whole meds with liquid Compensations Slow rate;Small sips/bites Postural Changes --   No flowsheet data found.  CHL IP FOLLOW UP RECOMMENDATIONS 02/02/2020 Follow up Recommendations None   CHL IP FREQUENCY AND DURATION 02/02/2020 Speech Therapy Frequency (ACUTE ONLY) min 2x/week Treatment Duration 1  week      CHL IP ORAL PHASE 02/02/2020 Oral Phase WFL Oral - Pudding Teaspoon -- Oral - Pudding Cup -- Oral - Honey Teaspoon -- Oral - Honey Cup -- Oral - Nectar Teaspoon -- Oral - Nectar Cup -- Oral - Nectar Straw -- Oral - Thin Teaspoon -- Oral - Thin Cup -- Oral - Thin Straw -- Oral - Puree -- Oral - Mech Soft -- Oral - Regular -- Oral - Multi-Consistency -- Oral - Pill -- Oral Phase - Comment --  CHL IP PHARYNGEAL PHASE 02/02/2020 Pharyngeal Phase WFL Pharyngeal- Pudding Teaspoon -- Pharyngeal -- Pharyngeal- Pudding Cup -- Pharyngeal -- Pharyngeal- Honey Teaspoon -- Pharyngeal -- Pharyngeal- Honey Cup -- Pharyngeal -- Pharyngeal- Nectar Teaspoon -- Pharyngeal -- Pharyngeal- Nectar Cup -- Pharyngeal -- Pharyngeal- Nectar Straw -- Pharyngeal -- Pharyngeal- Thin Teaspoon -- Pharyngeal -- Pharyngeal- Thin Cup -- Pharyngeal -- Pharyngeal- Thin Straw Penetration/Aspiration during swallow Pharyngeal Material enters airway, remains ABOVE vocal cords  then ejected out Pharyngeal- Puree -- Pharyngeal -- Pharyngeal- Mechanical Soft -- Pharyngeal -- Pharyngeal- Regular -- Pharyngeal -- Pharyngeal- Multi-consistency -- Pharyngeal -- Pharyngeal- Pill -- Pharyngeal -- Pharyngeal Comment --  CHL IP CERVICAL ESOPHAGEAL PHASE 02/02/2020 Cervical Esophageal Phase (No Data) Pudding Teaspoon -- Pudding Cup -- Honey Teaspoon -- Honey Cup -- Nectar Teaspoon -- Nectar Cup -- Nectar Straw -- Thin Teaspoon -- Thin Cup -- Thin Straw -- Puree -- Mechanical Soft -- Regular -- Multi-consistency -- Pill -- Cervical Esophageal Comment -- Shanika I. Vear Clock, MS, CCC-SLP Acute Rehabilitation Services Office number [phone] Pager [phone] Scheryl Marten 02/02/2020, 10:28 AM              ECHOCARDIOGRAM COMPLETE 01/31/2020 IMPRESSIONS   1. Technically difficult study, even with Definity contrast. There is no left ventricular thrombus. There is relatively preserved systolic function in the basal segments. Findings suggest stress  cardiomyopathy (takotsubo sd.), but could well represent multivessel CAD. Left ventricular ejection fraction, by estimation, is 30 to 35%. The left ventricle has moderate to severely decreased function. The left ventricle demonstrates global hypokinesis. The left ventricular internal cavity size was mildly dilated. Left ventricular diastolic parameters are consistent with Grade II diastolic dysfunction (pseudonormalization). Elevated left atrial pressure.   2. Right ventricular systolic function was not well visualized. The right ventricular size is normal. Tricuspid regurgitation signal is inadequate for assessing PA pressure.   3. The mitral valve is grossly normal. No evidence of mitral valve regurgitation. No evidence of mitral stenosis.   4. The aortic valve is grossly normal. Aortic valve regurgitation is not visualized. No aortic stenosis is present. Comparison(s): Prior images reviewed side by side. The left ventricular function is significantly worse. The left ventricular wall motion abnormalities are new. Consider serial studies, with Definity.   ECHOCARDIOGRAM LIMITED BUBBLE STUDY 02/08/2020 IMPRESSIONS   1. Left ventricular ejection fraction, by estimation, is 40 to 45%. The left ventricle has mildly decreased function. The left ventricle demonstrates global hypokinesis. There is mild left ventricular hypertrophy. Left ventricular diastolic parameters are indeterminate.   2. Right ventricular systolic function was not well visualized. The right ventricular size is not well visualized.   3. The mitral valve is grossly normal. No evidence of mitral valve regurgitation. No evidence of mitral stenosis.   4. The aortic valve is grossly normal. Aortic valve regurgitation is not visualized. No aortic stenosis is present.   5. Aortic dilatation noted. There is mild dilatation of the ascending aorta, measuring 39 mm.  Comparison(s): Changes from prior study are noted. LVEF somewhat improved.   CT  HEAD CODE STROKE WO CONTRAST 02/07/2020 IMPRESSION:  No acute intracranial hemorrhage or evidence of acute infarction. ASPECT score is 10.  US Abdomen Limited RUQ 01/30/2020 IMPRESSION:  Hepatic steatosis.  No focal hepatic lesions. Sequela of cholecystectomy.   DG ESOPHAGUS W SINGLE CM (SOL OR THIN BA) 02/02/2020 CLINICAL DATA:  Shortness of breath weakness. IMPRESSION:  1. Limited study without abnormality aside from signs of mild-to-moderate dysmotility. If there are continued symptoms that would suggest esophageal pathology a study as an outpatient could be performed for more complete assessment when the patient has recovered from this episode of acute illness.    PHYSICAL EXAM    Temp:  [97.9 F (36.6 C)-98.4 F (36.9 C)] 98 F (36.7 C) (09/11 0837) Pulse Rate:  [64-78] 67 (09/11 0837) Resp:  [13-25] 16 (09/11 0837) BP: (98-140)/(52-76) 133/67 (09/11 0837) SpO2:  [95 %-100 %] 97 % (09/11 0837)  General -  obese middle-aged Caucasian lady in no apparent distress.  Ophthalmologic - fundi not visualized due to noncooperation.  Cardiovascular - Regular rhythm and rate.  Neurological Exam ;  Awake  Alert oriented x 3. Normal speech and language.eye movements full without nystagmus.fundi were not visualized. Vision acuity and fields appear normal. Hearing is normal. Palatal movements are normal. Face symmetric. Tongue midline. Normal strength, tone, reflexes and coordination. Normal sensation. Gait deferred.  ASSESSMENT/PLAN Brenda [name] is a 59 y.o. female with history of hypertension, hyperlipidemia, diabetes, COPD, OSA on CPAP, morbid obesity, current smoker admitted for episode of left-sided weakness, nausea vomiting, slurred speech and double vision. No tPA given due to also window and symptom resolved.    TIA: Posterior circulation TIA secondary to distal basilar artery high grade stenosis  CT no acute finding.    CTA head and neck mid basilar artery stenosis.  Left VA  hypoplastic versus stenosis.  Atherosclerosis bilateral ICA bulbs.  MRI no acute infarct  IR - high grade stenosis of BA  2D Echo EF 40-45%, 01/31/2020 EF 30 to 35%  LDL 52  HgbA1c 6.2  UDS positive for THC  Lovenox for VTE prophylaxis  aspirin 81 mg daily prior to admission, now on aspirin 81mg  and brilinta 90mg  bid after load in preparation for BA stenting next week.   Patient counseled to be compliant with her antithrombotic medications  Ongoing aggressive stroke risk factor management  Therapy recommendations: Outpt PT recommended  Disposition: Pending  Cardiomyopathy Recent NSTEMI  Admitted 8/31-9/5 for COPD exacerbation, RSV pneumonia and respiratory failure  Found to have non-STEMI and EF 30 to 35%  Was treated with heparin drip and aspirin  Discharged with aspirin statin and metoprolol as well as losartan  This admission 2D echo EF 40-45%, improved  AKI  Creatinine 1.1->1.38->1.50->pending  On IV fluid  Diabetes  HgbA1c 6.2 goal < 7.0  Controlled  CBG monitoring  SSI  DM education and close PCP follow up  Hypotension History of hypertension . BP soft 100-120s . BP goal 120-150 given based artery stenosis . On IV fluid . Avoid low BP  Hyperlipidemia  Home meds: Lipitor 20  LDL 52, goal < 70  Now on Lipitor 40  Continue statin at discharge  Tobacco abuse  Current smoker  Smoking cessation counseling provided  Pt is willing to quit   Other Stroke Risk Factors  Morbid obesity, BMI 41.17  Obstructive sleep apnea, on CPAP at home  THC abuse, cessation education provided  Other Active Problems  Frequent COPD exacerbation  Leukocytosis WBC 10.9->16.8->18.0->16.1  Aortic Atherosclerosis (ICD10-I70.0)  PLAN  Possible BA stent Monday  She presented with transient diplopia and left-sided ataxia secondary to symptomatic distal basilar artery stenosis.  She is scheduled to undergo elective basilar artery angioplasty  stenting on Monday.  I counseled the patient to quit marijuana as well as maintain aggressive risk factor modification.  Continue aspirin and Brilinta.  Greater than 50% time during this 25-minute visit was spent in counseling and coordination of care about her TIA and symptomatic basilar stenosis and discussion of treatment options and answering questions.  Monday, MD  Hospital day # 1          To contact Stroke Continuity provider, please refer to Saturday. After hours, contact General Neurology

## 2020-02-10 NOTE — Progress Notes (Signed)
OT Cancellation Note  Patient Details Name: Brenda [name] MRN: [id number] DOB: March 12, 1961   Cancelled Treatment:    Reason Eval/Treat Not Completed: Other (comment). Per conversation with pt, pt appears to be at/close to baseline with ADLs. Will sign off as acute OT needs no longer indicated.   Raynald Kemp, OT Acute Rehabilitation Services Pager: [phone] Office: [phone]  02/10/2020, 1:02 PM

## 2020-02-10 NOTE — Plan of Care (Signed)

## 2020-02-10 NOTE — Evaluation (Signed)
Physical Therapy Evaluation Patient Details Name: Brenda [name] MRN: [id number] DOB: 12-18-1960 Today's Date: 02/10/2020   History of Present Illness  59 year old lady with prior history of COPD, chronic hypoxic respiratory failure on 3 L of nasal cannula oxygen at home, chronic chronic combined CHF, obstructive sleep apnea on CPAP, type 2 diabetes mellitus, stage IIIa CKD, recent admission for NSTEMI, presents to ED with left-sided weakness, slurred speech and nausea and vomiting.  Found to have basilar artery stenosis; awaiting potential stent placement.   Clinical Impression  Pt admitted with above complications. Patient denies any falls this year, feels that she is at her baseline. Demonstrates mild balance and gait deficits, however does not use cane at home. She would benefit from cane use and potentially skilled Outpatient PT due to elevated fall risk based on objective tests (BERG and DGI) performed today. Ideally we should re-evaluate functional mobility following stenting now that we have an objective baseline. Will continue to follow until discharge.       Follow Up Recommendations Outpatient PT    Equipment Recommendations     (none)   Recommendations for Other Services       Precautions / Restrictions Precautions Precautions: Fall Precaution Comments: monitor O2 Restrictions Weight Bearing Restrictions: No      Mobility  Bed Mobility Overal bed mobility: Independent                Transfers Overall transfer level: Modified independent   Transfers: Sit to/from Stand Sit to Stand: Modified independent (Device/Increase time)         General transfer comment: lightly touches furniture upon standing to steady herself.  Ambulation/Gait Ambulation/Gait assistance: Min guard Gait Distance (Feet): 100 Feet Assistive device: None Gait Pattern/deviations: Wide base of support;Staggering right Gait velocity: decreased Gait velocity interpretation: <1.8 ft/sec,  indicate of risk for recurrent falls General Gait Details: Intermittent stagger towards right noticed with higher level dynamic challenges only (head turns, quick turn around) She would reach for rail in hallway and able to self correct. No overt ataxia or LE buckling noted.  Stairs            Wheelchair Mobility    Modified Rankin (Stroke Patients Only) Modified Rankin (Stroke Patients Only) Pre-Morbid Rankin Score: No significant disability Modified Rankin: Slight disability     Balance Overall balance assessment: Modified Independent Sitting-balance support: No upper extremity supported;Feet supported Sitting balance-Leahy Scale: Normal     Standing balance support: No upper extremity supported Standing balance-Leahy Scale: Good                   Standardized Balance Assessment Standardized Balance Assessment : Berg Balance Test;Dynamic Gait Index Berg Balance Test Sit to Stand: Able to stand  independently using hands Standing Unsupported: Able to stand safely 2 minutes Sitting with Back Unsupported but Feet Supported on Floor or Stool: Able to sit safely and securely 2 minutes Stand to Sit: Sits safely with minimal use of hands Transfers: Able to transfer safely, minor use of hands Standing Unsupported with Eyes Closed: Able to stand 10 seconds with supervision Standing Ubsupported with Feet Together: Able to place feet together independently and stand 1 minute safely From Standing, Reach Forward with Outstretched Arm: Can reach confidently >25 cm (10") From Standing Position, Pick up Object from Floor: Able to pick up shoe safely and easily From Standing Position, Turn to Look Behind Over each Shoulder: Looks behind from both sides and weight shifts well Turn 360 Degrees: Able to  turn 360 degrees safely one side only in 4 seconds or less Standing Unsupported, Alternately Place Feet on Step/Stool: Able to stand independently and complete 8 steps >20  seconds Standing Unsupported, One Foot in Front: Able to plae foot ahead of the other independently and hold 30 seconds Standing on One Leg: Able to lift leg independently and hold equal to or more than 3 seconds Total Score: 49 Dynamic Gait Index Level Surface: Mild Impairment Change in Gait Speed: Mild Impairment Gait with Horizontal Head Turns: Moderate Impairment Gait with Vertical Head Turns: Mild Impairment Gait and Pivot Turn: Moderate Impairment Step Over Obstacle: Mild Impairment Step Around Obstacles: Mild Impairment Steps: Mild Impairment Total Score: 14       Pertinent Vitals/Pain Pain Assessment: No/denies pain Pain Intervention(s): Monitored during session    Home Living Family/patient expects to be discharged to:: Private residence Living Arrangements: Children;Other relatives Available Help at Discharge: Family;Available 24 hours/day Type of Home: House Home Access: Stairs to enter Entrance Stairs-Rails: Doctor, general practice of Steps: 5 to deck + 1 additional step Home Layout: One level Home Equipment: Cane - single point;Shower seat Additional Comments: wears 2-3 L [place] at baseline    Prior Function Level of Independence: Independent         Comments: Independent with ADLs, IADLs in the home without AD. Pt was driving. Cares for grandson while daughter at work. Since recent admission has been less active and resting     Hand Dominance   Dominant Hand: Right    Extremity/Trunk Assessment   Upper Extremity Assessment Upper Extremity Assessment: Defer to OT evaluation    Lower Extremity Assessment Lower Extremity Assessment: Overall WFL for tasks assessed    Cervical / Trunk Assessment Cervical / Trunk Assessment: Normal  Communication   Communication: No difficulties  Cognition Arousal/Alertness: Awake/alert Behavior During Therapy: WFL for tasks assessed/performed Overall Cognitive Status: Within Functional Limits for tasks  assessed                                 General Comments: A&O x4      General Comments General comments (skin integrity, edema, etc.): Moderate DOE, 3L supplemental O2, quickly recovers with rest.    Exercises     Assessment/Plan    PT Assessment Patient needs continued PT services  PT Problem List Decreased strength;Decreased activity tolerance;Decreased balance;Decreased mobility;Decreased coordination       PT Treatment Interventions DME instruction;Therapeutic activities;Gait training;Therapeutic exercise;Patient/family education;Stair training;Balance training;Functional mobility training;Neuromuscular re-education    PT Goals (Current goals can be found in the Care Plan section)  Acute Rehab PT Goals Patient Stated Goal: Return home PT Goal Formulation: With patient Time For Goal Achievement: 02/24/20 Potential to Achieve Goals: Good    Frequency Min 3X/week   Barriers to discharge        Co-evaluation               AM-PAC PT "6 Clicks" Mobility  Outcome Measure Help needed turning from your back to your side while in a flat bed without using bedrails?: None Help needed moving from lying on your back to sitting on the side of a flat bed without using bedrails?: None Help needed moving to and from a bed to a chair (including a wheelchair)?: None Help needed standing up from a chair using your arms (e.g., wheelchair or bedside chair)?: None Help needed to walk in hospital room?: None Help needed climbing  3-5 steps with a railing? : A Little 6 Click Score: 23    End of Session Equipment Utilized During Treatment: Gait belt;Oxygen Activity Tolerance: Patient tolerated treatment well Patient left: in bed;with call bell/phone within reach;with bed alarm set Nurse Communication: Mobility status PT Visit Diagnosis: Muscle weakness (generalized) (M62.81);Other abnormalities of gait and mobility (R26.89)    Time: 5686-1683 PT Time Calculation  (min) (ACUTE ONLY): 25 min   Charges:   PT Evaluation $PT Eval Low Complexity: 1 Low PT Treatments $Gait Training: 8-22 mins        Charlsie Merles, PT, DPT  [place] 02/10/2020, 9:46 AM

## 2020-02-10 NOTE — Progress Notes (Signed)
PROGRESS NOTE    Brenda [name]  FUX:[id number] DOB: 1961/03/11 DOA: 02/07/2020 PCP: [name], MD   Chief Complaint  Patient presents with  . Code Stroke    Brief Narrative:  59 year old lady with prior history of COPD, chronic hypoxic respiratory failure on 3 L of nasal cannula oxygen at home, chronic chronic combined CHF, obstructive sleep apnea on CPAP, type 2 diabetes mellitus, stage IIIa CKD, recent admission for NSTEMI, presents to ED with left-sided weakness, slurred speech and nausea and vomiting.  Initial CT of the head without contrast negative for acute hemorrhage or infarction.  CT angiogram of the head and neck showed basilar artery stenosis.  Neurology was consulted, IR was consulted and underwent cerebral angiogram today showing high grade stenosis of the basilar artery. She will probably need stent placement.   overnight pt had some anxiety and nasal bleeding which has resolved.   Assessment & Plan:   Principal Problem:   TIA (transient ischemic attack) Active Problems:   Leukocytosis   OSA on CPAP   Type 2 diabetes, controlled, with peripheral neuropathy (HCC)   Chronic respiratory failure with hypoxia (HCC)   COPD  GOLD II/III still smoking/ 02 dep    Chronic kidney disease, stage 3a   LFT elevation   Chronic combined systolic and diastolic CHF (congestive heart failure) (HCC)   Posterior circulation TIA probably secondary to basilar artery stenosis. CT head with contrast does not  show any acute finding.  CT angiogram of the head and neck Focal high-grade stenosis of the basilar artery at the superior cerebellar artery origins.  IR consulted and plan for cerebral angiogram later today. Aspirin and plavix daily, following Plavix bolus. Continue with lipitor  daily.  Further TIA work-up with echocardiogram and MRI of the brain ordered. PT/OT/SLP evaluations ordered. Outpatient follow up with PT.  LDL is 52  And /hemoglobin A1c is 6.2% Urine tox positive  for tetrahydrocannabinol. She underwent cerebral angiogram today showing high grade stenosis of the basilar artery. She will probably need stent placement.  Appreciate IR recommendations.    History of chronic combined systolic and diastolic heart failure Patient appears to be compensated and is on 3 L of nasal cannula oxygen.  Repeat echocardiogram reviewed showed left ventricular EF of 40 to 45%.    Obstructive sleep apnea on  CPAP at night continue the same.   Chronic respiratory failure with hypoxia on 3 L of nasal cannula oxygen Probably secondary to obstructive sleep apnea, COPD,, chronic, and heart failure.  Stage III CKD Creatinine appears to be at baseline at this time.    Leukocytosis Improved to 16,000/ patient remains afebrile.  Recheck CBC in am.  Covid screening test is negative.  Urine analysis and chest x-ray are negative for infection. Probably reactive.    Type 2 DM:  CBG (last 3)  Recent Labs    02/10/20 0611 02/10/20 1210 02/10/20 1637  GLUCAP 114* 145* 94   Resume SSI. Hemoglobin A1c  Is 6.1%   COPD No wheezing heard.  Resume bronchodilators.    Pt reports Nose bleeding from dryness. Nasal spray ordered. Nasal bleeding resolved.    Anxiety Resume ativan.   DVT prophylaxis: Lovenox Code Status: Full code Family Communication: None at bedside, discussed the plan with the daughter.  Disposition:   Status is: Observation  The patient will require care spanning > 2 midnights and should be moved to inpatient because: Ongoing diagnostic testing needed not appropriate for outpatient work up, Unsafe d/c plan  and Inpatient level of care appropriate due to severity of illness  Dispo: The patient is from: Home              Anticipated d/c is to: Home              Anticipated d/c date is: 3 days              Patient currently is not medically stable to d/c.       Consultants:   Neurology  IR  Procedures: (CT angiogram of the head and  neck MRI of the brain Echocardiogram Cerebral angiogram  Antimicrobials: None  Subjective: No new complaints.    Objective: Vitals:   02/10/20 0400 02/10/20 0837 02/10/20 1253 02/10/20 1638  BP: (!) 131/58 133/67 110/61 (!) 111/53  Pulse: 65 67 (!) 58 66  Resp: 17 16 18 18   Temp: 98.2 F (36.8 C) 98 F (36.7 C) 98.3 F (36.8 C) 97.9 F (36.6 C)  TempSrc: Oral Oral Oral Oral  SpO2: 99% 97% 100% 98%  Weight:      Height:       No intake or output data in the 24 hours ending 02/10/20 1653 Filed Weights   02/08/20 2136  Weight: 95.9 kg    Examination:  General exam: Alert and comfortable.  Respiratory system: clear to auscultation, no wheezing heard.  Cardiovascular system: S1S2 HEARD. RRR no JVD.  Gastrointestinal system: ABD is soft non tender non distended, bowel sounds wnl.  Central nervous system: alert and oriented.  Extremities: no cyanosis.  Skin:no rashes seen.   Psychiatry: Mood is appropriate.     Data Reviewed: I have personally reviewed following labs and imaging studies  CBC: Recent Labs  Lab 02/03/20 1919 02/04/20 0645 02/07/20 2053 02/07/20 2055 02/10/20 0352  WBC 10.9* 16.8* 18.0*  --  16.1*  NEUTROABS 9.0* 10.4* 10.9*  --   --   HGB 12.9 13.1 14.4 15.3* 12.1  HCT 39.9 40.5 44.6 45.0 38.1  MCV 91.7 92.3 92.9  --  93.6  PLT 183 192 277  --  238    Basic Metabolic Panel: Recent Labs  Lab 02/03/20 1919 02/04/20 0645 02/07/20 2053 02/07/20 2055 02/10/20 0352  NA 138 139 138 137 138  K 5.2* 4.6 4.4 4.2 4.1  CL 103 102 98 100 106  CO2 25 27 28   --  26  GLUCOSE 190* 126* 138* 139* 109*  BUN 24* 24* 21 24* 16  CREATININE 1.10* 1.11* 1.38* 1.50* 0.89  CALCIUM 9.2 9.3 9.5  --  9.1    GFR: Estimated Creatinine Clearance: 70.2 mL/min (by C-G formula based on SCr of 0.89 mg/dL).  Liver Function Tests: Recent Labs  Lab 02/03/20 1919 02/04/20 0645 02/07/20 2053  AST 57* 35 23  ALT 561* 457* 126*  ALKPHOS 87 76 88  BILITOT 0.5  0.7 0.5  PROT 6.5 6.5 7.0  ALBUMIN 3.2* 3.1* 3.5    CBG: Recent Labs  Lab 02/09/20 1142 02/09/20 2110 02/10/20 0611 02/10/20 1210 02/10/20 1637  GLUCAP 107* 87 114* 145* 94     Recent Results (from the past 240 hour(s))  MRSA PCR Screening     Status: None   Collection Time: 01/31/20  8:46 PM   Specimen: Nasal Mucosa; Nasopharyngeal  Result Value Ref Range Status   MRSA by PCR NEGATIVE NEGATIVE Final    Comment:        The GeneXpert MRSA Assay (FDA approved for NASAL specimens only), is one component  of a comprehensive MRSA colonization surveillance program. It is not intended to diagnose MRSA infection nor to guide or monitor treatment for MRSA infections. Performed at [facility], [address], [place], [place] [address]   Respiratory Panel by PCR     Status: Abnormal   Collection Time: 02/02/20  8:57 PM   Specimen: Nasopharyngeal Swab; Respiratory  Result Value Ref Range Status   Adenovirus NOT DETECTED NOT DETECTED Final   Coronavirus 229E NOT DETECTED NOT DETECTED Final    Comment: (NOTE) The Coronavirus on the Respiratory Panel, DOES NOT test for the novel  Coronavirus (2019 nCoV)    Coronavirus HKU1 NOT DETECTED NOT DETECTED Final   Coronavirus NL63 NOT DETECTED NOT DETECTED Final   Coronavirus OC43 NOT DETECTED NOT DETECTED Final   Metapneumovirus NOT DETECTED NOT DETECTED Final   Rhinovirus / Enterovirus NOT DETECTED NOT DETECTED Final   Influenza A NOT DETECTED NOT DETECTED Final   Influenza B NOT DETECTED NOT DETECTED Final   Parainfluenza Virus 1 NOT DETECTED NOT DETECTED Final   Parainfluenza Virus 2 NOT DETECTED NOT DETECTED Final   Parainfluenza Virus 3 NOT DETECTED NOT DETECTED Final   Parainfluenza Virus 4 NOT DETECTED NOT DETECTED Final   Respiratory Syncytial Virus DETECTED (A) NOT DETECTED Final   Bordetella pertussis NOT DETECTED NOT DETECTED Final   Chlamydophila pneumoniae NOT DETECTED NOT DETECTED Final   Mycoplasma  pneumoniae NOT DETECTED NOT DETECTED Final    Comment: Performed at [facility], [address] [place], [place] [address]  SARS Coronavirus 2 by RT PCR (hospital order, performed in [facility] hospital lab) Nasopharyngeal Nasopharyngeal Swab     Status: None   Collection Time: 02/07/20  1:35 AM   Specimen: Nasopharyngeal Swab  Result Value Ref Range Status   SARS Coronavirus 2 NEGATIVE NEGATIVE Final    Comment: (NOTE) SARS-CoV-2 target nucleic acids are NOT DETECTED.  The SARS-CoV-2 RNA is generally detectable in upper and lower respiratory specimens during the acute phase of infection. The lowest concentration of SARS-CoV-2 viral copies this assay can detect is 250 copies / mL. A negative result does not preclude SARS-CoV-2 infection and should not be used as the sole basis for treatment or other patient management decisions.  A negative result may occur with improper specimen collection / handling, submission of specimen other than nasopharyngeal swab, presence of viral mutation(s) within the areas targeted by this assay, and inadequate number of viral copies (<250 copies / mL). A negative result must be combined with clinical observations, patient history, and epidemiological information.  Fact Sheet for Patients:   BoilerBrush.com.cy  Fact Sheet for Healthcare Providers: https://pope.com/  This test is not yet approved or  cleared by the [place] FDA and has been authorized for detection and/or diagnosis of SARS-CoV-2 by FDA under an Emergency Use Authorization (EUA).  This EUA will remain in effect (meaning this test can be used) for the duration of the COVID-19 declaration under Section 564(b)(1) of the Act, 21 U.S.C. section 360bbb-3(b)(1), unless the authorization is terminated or revoked sooner.  Performed at [facility], [address], [place], [place] [address]          Radiology  Studies: No results found.      Scheduled Meds: . aspirin EC  81 mg Oral Daily  . atorvastatin  40 mg Oral QHS  . enoxaparin (LOVENOX) injection  40 mg Subcutaneous Daily  . fluticasone furoate-vilanterol  1 puff Inhalation Daily  And  . umeclidinium bromide  1 puff Inhalation Daily  . insulin aspart  0-5 Units Subcutaneous QHS  . insulin aspart  0-9 Units Subcutaneous TID WC  . pantoprazole  40 mg Oral Daily  . ticagrelor  90 mg Oral BID   Continuous Infusions: . sodium chloride 75 mL/hr at 02/08/20 1235     LOS: 1 day       Kathlen Mody, MD [facility]   To contact the attending provider between 7A-7P or the covering provider during after hours 7P-7A, please log into the web site [email or web address] and access using universal [place] password for that web site. If you do not have the password, please call the hospital operator.  02/10/2020, 4:53 PM

## 2020-02-11 LAB — GLUCOSE, CAPILLARY
Glucose-Capillary: 122 mg/dL — ABNORMAL HIGH (ref 70–99)
Glucose-Capillary: 179 mg/dL — ABNORMAL HIGH (ref 70–99)
Glucose-Capillary: 149 mg/dL — ABNORMAL HIGH (ref 70–99)
Glucose-Capillary: 104 mg/dL — ABNORMAL HIGH (ref 70–99)

## 2020-02-11 NOTE — Progress Notes (Signed)
STROKE TEAM PROGRESS NOTE   SUBJECTIVE (INTERVAL HISTORY) She is sitting up comfortably in bed.  She has no complaints.  Neurological exam unchanged.  She is scheduled for elective angioplasty stenting tomorrow vital signs stable.  OBJECTIVE Temp:  [97.9 F (36.6 C)-98.2 F (36.8 C)] 97.9 F (36.6 C) (09/12 1125) Pulse Rate:  [66-70] 66 (09/12 1125) Cardiac Rhythm: Normal sinus rhythm (09/12 1021) Resp:  [17-20] 18 (09/12 1125) BP: (105-134)/(50-65) 127/50 (09/12 1125) SpO2:  [95 %-99 %] 99 % (09/12 1125)  Recent Labs  Lab 02/10/20 1210 02/10/20 1637 02/10/20 2105 02/11/20 0603 02/11/20 1124  GLUCAP 145* 94 169* 122* 179*   Recent Labs  Lab 02/07/20 2053 02/07/20 2055 02/10/20 0352  NA 138 137 138  K 4.4 4.2 4.1  CL 98 100 106  CO2 28  --  26  GLUCOSE 138* 139* 109*  BUN 21 24* 16  CREATININE 1.38* 1.50* 0.89  CALCIUM 9.5  --  9.1   Recent Labs  Lab 02/07/20 2053  AST 23  ALT 126*  ALKPHOS 88  BILITOT 0.5  PROT 7.0  ALBUMIN 3.5   Recent Labs  Lab 02/07/20 2053 02/07/20 2055 02/10/20 0352  WBC 18.0*  --  16.1*  NEUTROABS 10.9*  --   --   HGB 14.4 15.3* 12.1  HCT 44.6 45.0 38.1  MCV 92.9  --  93.6  PLT 277  --  238   No results for input(s): CKTOTAL, CKMB, CKMBINDEX, TROPONINI in the last 168 hours. No results for input(s): LABPROT, INR in the last 72 hours. No results for input(s): COLORURINE, LABSPEC, PHURINE, GLUCOSEU, HGBUR, BILIRUBINUR, KETONESUR, PROTEINUR, UROBILINOGEN, NITRITE, LEUKOCYTESUR in the last 72 hours.  Invalid input(s): APPERANCEUR     Component Value Date/Time   CHOL 117 02/09/2020 0214   TRIG 139 02/09/2020 0214   HDL 37 (L) 02/09/2020 0214   CHOLHDL 3.2 02/09/2020 0214   VLDL 28 02/09/2020 0214   LDLCALC 52 02/09/2020 0214   Lab Results  Component Value Date   HGBA1C 6.1 (H) 02/10/2020      Component Value Date/Time   LABOPIA NONE DETECTED 02/08/2020 0919   COCAINSCRNUR NONE DETECTED 02/08/2020 0919   LABBENZ NONE  DETECTED 02/08/2020 0919   AMPHETMU NONE DETECTED 02/08/2020 0919   THCU POSITIVE (A) 02/08/2020 0919   LABBARB NONE DETECTED 02/08/2020 0919    Recent Labs  Lab 02/07/20 2053  ETH <10    I have personally reviewed the radiological images below and agree with the radiology interpretations.  CT Angio Head W or Wo Contrast CT Angio Neck W and/or Wo Contrast 02/07/2020 IMPRESSION:  No large vessel occlusion. Plaque at the ICA origins causes less than 50% stenosis. Small caliber nondominant left vertebral artery becomes diminutive intracranially with high-grade stenosis or occlusion distally. A left PICA origin is not identified and may be congenitally absent. A small left AICA appears to be present. Focal high-grade stenosis of the basilar artery at the superior cerebellar artery origins.   CT CHEST WO CONTRAST 01/31/2020 IMPRESSION:  1. Mixed ground-glass, consolidation and tree-in-bud nodularity predominantly in the right lower lobe and to a lesser extent the posterior segment right upper lobe. Findings could reflect an acute multifocal infection or inflammatory process or sequela of aspiration.  2. Additional superimposed features of septal thickening and vascular redistribution/congestion with trace effusions may suggest some superimposed edematous change as well.  3. Mild body wall edema.  4. Aortic Atherosclerosis (ICD10-I70.0).  5. Coronary artery calcifications are present. Please  note that the presence of coronary artery calcium documents the presence of coronary artery disease, the severity of this disease and any potential stenosis cannot be assessed on this non-gated CT examination.   MR BRAIN WO CONTRAST 02/08/2020 IMPRESSION:  1. No acute intracranial abnormality.  2. Empty sella. While this finding is often incidental in nature and of no clinical significance, this can also be seen in the setting of idiopathic intracranial hypertension.  3. Otherwise unremarkable and normal  brain MRI for age.  DG CHEST PORT 1 VIEW 02/08/2020 IMPRESSION:  Mild infiltrates in the lung bases with improvement since previous study.   DG Chest Portable 1 View 01/30/2020 IMPRESSION:  1. Diffuse prominent bilateral interstitial prominence consistent with pneumonitis.  2.  Borderline cardiomegaly.  No pulmonary venous congestion.  DG Swallowing Func-Speech Pathology 02/02/2020 Objective Swallowing Evaluation: Type of Study: MBS-Modified Barium Swallow Study  Patient Details Name: Brenda [name] MRN: [id number] Date of Birth: 1961-02-23 Today's Date: 02/02/2020 Time: SLP Start Time (ACUTE ONLY): 0949 -SLP Stop Time (ACUTE ONLY): 1003 SLP Time Calculation (min) (ACUTE ONLY): 14 min Past Medical History: Past Medical History: Diagnosis Date . Acute pancreatitis  . Asthma   main dyspnea thought due to deconditioning (10/2013) . Bipolar I disorder, most recent episode (or current) manic, unspecified   pt denies this . Bronchitis, chronic obstructive (HCC) 2008 & 2009  FeV1 64% TLC 105% DLCO 55% 2008  -  FeV1 81% FeF 25-75 43% 2009 . COPD (chronic obstructive pulmonary disease) (HCC)   emphysema, not an issue per pulm (10/2013) . History of pyelonephritis 05/2012 . HTN (hypertension)  . Hx of migraines  . Knee pain, right  . Leukocytosis, unspecified  . Lumbar disc disease with radiculopathy   multilevel spondylitic changes, scoliosis, and anterolisthesis L4 not thought to currently be surg candidate (Dr. Phoebe Perch, [facility]) (Dr. Ollen Bowl, [facility]) . Nicotine addiction  . Nonspecific abnormal electrocardiogram (ECG) (EKG)  . Obesity  . Pure hyperglyceridemia  . Suicide attempt (HCC) 06/2001 . Swelling of limb  . Urge and stress incontinence 07/2012  ([facility]) Past Surgical History: Past Surgical History: Procedure Laterality Date . CHOLECYSTECTOMY  07/1982 . CT MAXILLOFACIAL WO/W CM  06/14/06  Left max mucocele . CT of chest  06/14/2006  Normal except c/w active inflammation / infection.  Left renal atrophy . ERCP /  sphincterotomy - stenosis  01/15/06 . lumbar MRI  11/2010  multilevel spondylitic changes with upper lumbar scoliosis and anterolisthesis of L4 on 5 with some L foraminal narrowing esp at L/3 with concavity of scoliosis, some central stenosis and biforaminal narrowing at L4/5 with spondylolisthesis . [facility] - Pneumonia, acute asthma, left maxillary sinusitis  01/11 - 06/18/2006 . Retained stone    ERCP secondary to retained stone . Right knee surgery  1984,1989,1989,1991,1994  Reconstructions for ACL insuff . TUBAL LIGATION   HPI: Pt is a 59 y.o. female with medical history significant of chronic hypoxic respiratory failure on 2 to 3 L baseline O2 and CPAP at bedtime, COPD Gold stage III (FEV1 44%, improved with bronchodilator to 50%, 2019), chronic cigarette smoker 5-10 cigarettes a day hypertension, OSA on CPAP at bedtime, IIDM, diabetic neuropathy, chronic pain, depression/anxiety, morbid obesity who presented to the ED with increasing short of breath. Rapid response on 9/1 due to respiratory distress and was placed on non rebreather mask. CT chest 9/1: Mixed ground-glass, consolidation and tree-in-bud nodularity predominantly in the right lower lobe and to a lesser extent the posterior segment right upper lobe. Findings  could reflect an acute multifocal infection or inflammatory process or sequela of aspiration. Pt reported "getting choked on food often" to Dr. Katrinka BlazingSmith.  No data recorded Assessment / Plan / Recommendation CHL IP CLINICAL IMPRESSIONS 02/02/2020 Clinical Impression Pt's oropharyngeal swallow mechanism was within functional limits with transient penetration (PAS 2) of thin liquids when consecutive swallows were used. Esophageal screening revealed backflow of material from the lower to upper thoracic esophagus and mild esophageal stasis. Backflow to the pharynx was not observed while the fluoro was on. However, pt demonstrated subsequent coughing and the possibility of aspiration secondary to  backflowed material reaching the cervical esophagus and pharynx is suspected. Considering the results of the esophageal screening combined with her reported symptoms, assessment (e.g., esophagram) of the esophageal phase of her swallow is recommended. Pt was educated regarding results and recommendations as well as strategies to reduce aspiration risk during episodes of dyspnea. She verbalized understanding. Further skilled SLP services are not clinically indicated at this time.  SLP Visit Diagnosis Dysphagia, unspecified (R13.10) Attention and concentration deficit following -- Frontal lobe and executive function deficit following -- Impact on safety and function Mild aspiration risk   CHL IP TREATMENT RECOMMENDATION 02/02/2020 Treatment Recommendations No treatment recommended at this time   Prognosis 02/02/2020 Prognosis for Safe Diet Advancement Good Barriers to Reach Goals -- Barriers/Prognosis Comment -- CHL IP DIET RECOMMENDATION 02/02/2020 SLP Diet Recommendations Regular solids;Thin liquid Liquid Administration via Cup;Straw Medication Administration Whole meds with liquid Compensations Slow rate;Small sips/bites Postural Changes --   No flowsheet data found.  CHL IP FOLLOW UP RECOMMENDATIONS 02/02/2020 Follow up Recommendations None   CHL IP FREQUENCY AND DURATION 02/02/2020 Speech Therapy Frequency (ACUTE ONLY) min 2x/week Treatment Duration 1 week      CHL IP ORAL PHASE 02/02/2020 Oral Phase WFL Oral - Pudding Teaspoon -- Oral - Pudding Cup -- Oral - Honey Teaspoon -- Oral - Honey Cup -- Oral - Nectar Teaspoon -- Oral - Nectar Cup -- Oral - Nectar Straw -- Oral - Thin Teaspoon -- Oral - Thin Cup -- Oral - Thin Straw -- Oral - Puree -- Oral - Mech Soft -- Oral - Regular -- Oral - Multi-Consistency -- Oral - Pill -- Oral Phase - Comment --  CHL IP PHARYNGEAL PHASE 02/02/2020 Pharyngeal Phase WFL Pharyngeal- Pudding Teaspoon -- Pharyngeal -- Pharyngeal- Pudding Cup -- Pharyngeal -- Pharyngeal- Honey Teaspoon --  Pharyngeal -- Pharyngeal- Honey Cup -- Pharyngeal -- Pharyngeal- Nectar Teaspoon -- Pharyngeal -- Pharyngeal- Nectar Cup -- Pharyngeal -- Pharyngeal- Nectar Straw -- Pharyngeal -- Pharyngeal- Thin Teaspoon -- Pharyngeal -- Pharyngeal- Thin Cup -- Pharyngeal -- Pharyngeal- Thin Straw Penetration/Aspiration during swallow Pharyngeal Material enters airway, remains ABOVE vocal cords then ejected out Pharyngeal- Puree -- Pharyngeal -- Pharyngeal- Mechanical Soft -- Pharyngeal -- Pharyngeal- Regular -- Pharyngeal -- Pharyngeal- Multi-consistency -- Pharyngeal -- Pharyngeal- Pill -- Pharyngeal -- Pharyngeal Comment --  CHL IP CERVICAL ESOPHAGEAL PHASE 02/02/2020 Cervical Esophageal Phase (No Data) Pudding Teaspoon -- Pudding Cup -- Honey Teaspoon -- Honey Cup -- Nectar Teaspoon -- Nectar Cup -- Nectar Straw -- Thin Teaspoon -- Thin Cup -- Thin Straw -- Puree -- Mechanical Soft -- Regular -- Multi-consistency -- Pill -- Cervical Esophageal Comment -- Brenda I. Vear ClockPhillips, MS, CCC-SLP Acute Rehabilitation Services Office number [phone] Pager [phone] Scheryl MartenShanika I [name] 02/02/2020, 10:28 AM              ECHOCARDIOGRAM COMPLETE 01/31/2020 IMPRESSIONS   1. Technically difficult study, even with Definity contrast. There is no  left ventricular thrombus. There is relatively preserved systolic function in the basal segments. Findings suggest stress cardiomyopathy (takotsubo sd.), but could well represent multivessel CAD. Left ventricular ejection fraction, by estimation, is 30 to 35%. The left ventricle has moderate to severely decreased function. The left ventricle demonstrates global hypokinesis. The left ventricular internal cavity size was mildly dilated. Left ventricular diastolic parameters are consistent with Grade II diastolic dysfunction (pseudonormalization). Elevated left atrial pressure.   2. Right ventricular systolic function was not well visualized. The right ventricular size is normal. Tricuspid  regurgitation signal is inadequate for assessing PA pressure.   3. The mitral valve is grossly normal. No evidence of mitral valve regurgitation. No evidence of mitral stenosis.   4. The aortic valve is grossly normal. Aortic valve regurgitation is not visualized. No aortic stenosis is present. Comparison(s): Prior images reviewed side by side. The left ventricular function is significantly worse. The left ventricular wall motion abnormalities are new. Consider serial studies, with Definity.   ECHOCARDIOGRAM LIMITED BUBBLE STUDY 02/08/2020 IMPRESSIONS   1. Left ventricular ejection fraction, by estimation, is 40 to 45%. The left ventricle has mildly decreased function. The left ventricle demonstrates global hypokinesis. There is mild left ventricular hypertrophy. Left ventricular diastolic parameters are indeterminate.   2. Right ventricular systolic function was not well visualized. The right ventricular size is not well visualized.   3. The mitral valve is grossly normal. No evidence of mitral valve regurgitation. No evidence of mitral stenosis.   4. The aortic valve is grossly normal. Aortic valve regurgitation is not visualized. No aortic stenosis is present.   5. Aortic dilatation noted. There is mild dilatation of the ascending aorta, measuring 39 mm.  Comparison(s): Changes from prior study are noted. LVEF somewhat improved.   CT HEAD CODE STROKE WO CONTRAST 02/07/2020 IMPRESSION:  No acute intracranial hemorrhage or evidence of acute infarction. ASPECT score is 10.  US Abdomen Limited RUQ 01/30/2020 IMPRESSION:  Hepatic steatosis.  No focal hepatic lesions. Sequela of cholecystectomy.   DG ESOPHAGUS W SINGLE CM (SOL OR THIN BA) 02/02/2020 CLINICAL DATA:  Shortness of breath weakness. IMPRESSION:  1. Limited study without abnormality aside from signs of mild-to-moderate dysmotility. If there are continued symptoms that would suggest esophageal pathology a study as an outpatient could be  performed for more complete assessment when the patient has recovered from this episode of acute illness.    PHYSICAL EXAM     Temp:  [97.9 F (36.6 C)-98.2 F (36.8 C)] 97.9 F (36.6 C) (09/12 1125) Pulse Rate:  [66-70] 66 (09/12 1125) Resp:  [17-20] 18 (09/12 1125) BP: (105-134)/(50-65) 127/50 (09/12 1125) SpO2:  [95 %-99 %] 99 % (09/12 1125)  General -obese middle-aged Caucasian lady in no apparent distress.  Ophthalmologic - fundi not visualized due to noncooperation.  Cardiovascular - Regular rhythm and rate.  Neurological Exam ;  Awake  Alert oriented x 3. Normal speech and language.eye movements full without nystagmus.fundi were not visualized. Vision acuity and fields appear normal. Hearing is normal. Palatal movements are normal. Face symmetric. Tongue midline. Normal strength, tone, reflexes and coordination. Normal sensation. Gait deferred.  ASSESSMENT/PLAN Brenda [name] is a 59 y.o. female with history of hypertension, hyperlipidemia, diabetes, COPD, OSA on CPAP, morbid obesity, current smoker admitted for episode of left-sided weakness, nausea vomiting, slurred speech and double vision. No tPA given due to also window and symptom resolved.    TIA: Posterior circulation TIA secondary to distal basilar artery high grade stenosis  CT no acute finding.    CTA head and neck mid basilar artery stenosis.  Left VA hypoplastic versus stenosis.  Atherosclerosis bilateral ICA bulbs.  MRI no acute infarct  IR - high grade stenosis of BA  2D Echo EF 40-45%, 01/31/2020 EF 30 to 35%  LDL 52  HgbA1c 6.2  UDS positive for THC  Lovenox for VTE prophylaxis  aspirin 81 mg daily prior to admission, now on aspirin 81mg  and brilinta 90mg  bid after load in preparation for BA stenting next week.   Patient counseled to be compliant with her antithrombotic medications  Ongoing aggressive stroke risk factor management  Therapy recommendations: Outpt PT  recommended  Disposition: Pending  Cardiomyopathy Recent NSTEMI  Admitted 8/31-9/5 for COPD exacerbation, RSV pneumonia and respiratory failure  Found to have non-STEMI and EF 30 to 35%  Was treated with heparin drip and aspirin  Discharged with aspirin statin and metoprolol as well as losartan  This admission 2D echo EF 40-45%, improved  AKI  Creatinine 1.1->1.38->1.50->0.89  On IV fluid  Diabetes  HgbA1c 6.2 goal < 7.0  Controlled  CBG monitoring  SSI  DM education and close PCP follow up  Hypotension History of hypertension . BP soft 100-120s . BP goal 120-150 given based artery stenosis . On IV fluid . Avoid low BP  Hyperlipidemia  Home meds: Lipitor 20  LDL 52, goal < 70  Now on Lipitor 40  Continue statin at discharge  Tobacco abuse  Current smoker  Smoking cessation counseling provided  Pt is willing to quit   Other Stroke Risk Factors  Morbid obesity, BMI 41.17  Obstructive sleep apnea, on CPAP at home  THC abuse, cessation education provided  Other Active Problems  Frequent COPD exacerbation  Leukocytosis WBC 10.9->16.8->18.0->16.1 (afebrile)  Aortic Atherosclerosis (ICD10-I70.0)  PLAN  Possible BA stent Monday.  Continue aspirin and Brilinta.  06-04-1972, MD    Hospital day # 2          To contact Stroke Continuity provider, please refer to Friday. After hours, contact General Neurology

## 2020-02-11 NOTE — Anesthesia Preprocedure Evaluation (Addendum)
Anesthesia Evaluation  Patient identified by MRN, date of birth, ID band Patient awake    Reviewed: Patient's Chart, lab work & pertinent test results  Airway Mallampati: II  TM Distance: >3 FB Neck ROM: Full    Dental  (+) Edentulous Upper, Edentulous Lower   Pulmonary asthma , sleep apnea , COPD, Current Smoker,  FeV1 64% TLC 105% DLCO 55% 2008  -  FeV1 81% FeF 25-75 43% 2009    + decreased breath sounds      Cardiovascular hypertension, +CHF   Rhythm:Regular Rate:Normal  Echo 02/08/2020 1. Left ventricular ejection fraction, by estimation, is 40 to 45%. The left ventricle has mildly decreased function. The left ventricle demonstrates global hypokinesis. There is mild left ventricular hypertrophy. Left ventricular diastolic parameters are indeterminate.  2. Right ventricular systolic function was not well visualized. The right ventricular size is not well visualized.  3. The mitral valve is grossly normal. No evidence of mitral valve regurgitation. No evidence of mitral stenosis.  4. The aortic valve is grossly normal. Aortic valve regurgitation is not visualized. No aortic stenosis is present.  5. Aortic dilatation noted. There is mild dilatation of the ascending aorta, measuring 39 mm.    Neuro/Psych  Headaches, PSYCHIATRIC DISORDERS Bipolar Disorder TIA   GI/Hepatic negative GI ROS, Neg liver ROS,   Endo/Other  diabetesMorbid obesity  Renal/GU Renal disease     Musculoskeletal  (+) Arthritis , Osteoarthritis,  multilevel spondylitic changes, scoliosis, and anterolisthesis L4 not thought to currently be surg candidate    Abdominal (+) + obese,   Peds  Hematology negative hematology ROS (+)   Anesthesia Other Findings   Reproductive/Obstetrics                          Anesthesia Physical Anesthesia Plan  ASA: III  Anesthesia Plan: General   Post-op Pain Management:    Induction:  Intravenous  PONV Risk Score and Plan: 2 and Ondansetron, Treatment may vary due to age or medical condition and Dexamethasone  Airway Management Planned: Oral ETT and Mask  Additional Equipment: Arterial line  Intra-op Plan:   Post-operative Plan: Possible Post-op intubation/ventilation  Informed Consent: I have reviewed the patients History and Physical, chart, labs and discussed the procedure including the risks, benefits and alternatives for the proposed anesthesia with the patient or authorized representative who has indicated his/her understanding and acceptance.       Plan Discussed with: CRNA and Surgeon  Anesthesia Plan Comments: (PMH COPD, chronic hypoxic respiratory failure, chronic combined CHF, OSA on CPAP, DM II, CKD III, with acute on chronic respiratory failure and non-STEMI who presented with slurred speech found to have now presenting for carotid stent placement  ECHO 02/08/20: IMPRESSION: 1. Left ventricular ejection fraction, by estimation, is 40 to 45%. The left ventricle has mildly decreased function. The left ventricle demonstrates global hypokinesis. There is mild left ventricular hypertrophy. Left ventricular diastolic parameters  are indeterminate. 2. Right ventricular systolic function was not well visualized. The right ventricular size is not well visualized. 3. The mitral valve is grossly normal. No evidence of mitral valve regurgitation. No evidence of mitral stenosis. 4. The aortic valve is grossly normal. Aortic valve regurgitation is not visualized. No aortic stenosis is present. 5. Aortic dilatation noted. There is mild dilatation of the ascending aorta, measuring 39 mm.)     Anesthesia Quick Evaluation

## 2020-02-11 NOTE — Progress Notes (Signed)
Patient refused CPAP for the night  

## 2020-02-11 NOTE — Progress Notes (Signed)
PROGRESS NOTE    Brenda [name]  GMW:[id number] DOB: 1960-08-18 DOA: 02/07/2020 PCP: [name], MD   Chief Complaint  Patient presents with  . Code Stroke    Brief Narrative:  59 year old lady with prior history of COPD, chronic hypoxic respiratory failure on 3 L of nasal cannula oxygen at home, chronic chronic combined CHF, obstructive sleep apnea on CPAP, type 2 diabetes mellitus, stage IIIa CKD, recent admission for NSTEMI, presents to ED with left-sided weakness, slurred speech and nausea and vomiting.  Initial CT of the head without contrast negative for acute hemorrhage or infarction.  CT angiogram of the head and neck showed basilar artery stenosis.  Neurology was consulted, IR was consulted and underwent cerebral angiogram  showing high grade stenosis of the basilar artery. She will probably need stent placement. She is scheduled for elective stenting tomorrow.  Pt seen and examined at bedside, no nasal bleeding today.   Assessment & Plan:   Principal Problem:   TIA (transient ischemic attack) Active Problems:   Leukocytosis   OSA on CPAP   Type 2 diabetes, controlled, with peripheral neuropathy (HCC)   Chronic respiratory failure with hypoxia (HCC)   COPD  GOLD II/III still smoking/ 02 dep    Chronic kidney disease, stage 3a   LFT elevation   Chronic combined systolic and diastolic CHF (congestive heart failure) (HCC)   Posterior circulation TIA probably secondary to basilar artery stenosis. CT head with contrast does not  show any acute finding.  CT angiogram of the head and neck Focal high-grade stenosis of the basilar artery at the superior cerebellar artery origins.  IR consulted and plan for cerebral angiogram later today. Aspirin and plavix daily, following Plavix bolus. Continue with lipitor  daily.  Further TIA work-up with echocardiogram and MRI of the brain ordered. PT/OT/SLP evaluations ordered. Outpatient follow up with PT.  LDL is 52  And /hemoglobin  A1c is 6.2% Urine tox positive for tetrahydrocannabinol. She underwent cerebral angiogram today showing high grade stenosis of the basilar artery. She will probably need stent placement.  Appreciate IR recommendations. Scheduled for tomorrow.    History of chronic combined systolic and diastolic heart failure Patient appears to be compensated and is on 3 L of nasal cannula oxygen.  Repeat echocardiogram reviewed showed left ventricular EF of 40 to 45%.  Continue with strict intake and output. Daily weights.    Obstructive sleep apnea on  CPAP at night continue the same.   Chronic respiratory failure with hypoxia on 3 L of nasal cannula oxygen Probably secondary to obstructive sleep apnea, COPD,, chronic, and heart failure. No respiratory issues.  Continue with bronchodilators as needed.   Stage III CKD Creatinine appears to be at baseline at this time. Repeat labs in am.    Leukocytosis Improved to 16,000/ patient remains afebrile.  Recheck CBC in am.  Covid screening test is negative.  Urine analysis and chest x-ray are negative for infection. Probably reactive.    Type 2 DM:  CBG (last 3)  Recent Labs    02/10/20 1637 02/10/20 2105 02/11/20 0603  GLUCAP 94 169* 122*   Resume SSI. Hemoglobin A1c  Is 6.1%. no changes in her meds.    COPD No wheezing heard.  Resume bronchodilators.    Pt reports Nose bleeding from dryness. Nasal spray ordered. Nasal bleeding resolved.    Anxiety Resume ativan.   DVT prophylaxis: Lovenox Code Status: Full code Family Communication: None at bedside, discussed the plan with the daughter.  Disposition:   Status is: Observation  The patient will require care spanning > 2 midnights and should be moved to inpatient because: Ongoing diagnostic testing needed not appropriate for outpatient work up, Unsafe d/c plan and Inpatient level of care appropriate due to severity of illness  Dispo: The patient is from: Home               Anticipated d/c is to: Home              Anticipated d/c date is: 2 days              Patient currently is not medically stable to d/c.       Consultants:   Neurology  IR  Procedures: (CT angiogram of the head and neck MRI of the brain Echocardiogram Cerebral angiogram  Antimicrobials: None  Subjective: No chest pain or sob, no headache or dizziness.   Objective: Vitals:   02/10/20 1638 02/10/20 1935 02/10/20 2327 02/11/20 0350  BP: (!) 111/53 134/65 (!) 105/54 114/64  Pulse: 66 70 70 68  Resp: 18 18 20 17   Temp: 97.9 F (36.6 C) 98.2 F (36.8 C) 98.2 F (36.8 C) 98.2 F (36.8 C)  TempSrc: Oral Oral    SpO2: 98%  97% 95%  Weight:      Height:       No intake or output data in the 24 hours ending 02/11/20 1110 Filed Weights   02/08/20 2136  Weight: 95.9 kg    Examination:  General exam: alert and comfortable on 3 lit of  Oxygen.  Respiratory system: Clear to auscultation, no wheezing or rhonchi, no tachypnea Cardiovascular system: S1S2 heard, RRR no JVD,  Gastrointestinal system: abdomen is soft NT ND BS+ Central nervous system:Alert and Oriented, non focal  Extremities: No cyanosis or clubbing.  Skin: No rashes seen.   Psychiatry: slightly anxious.      Data Reviewed: I have personally reviewed following labs and imaging studies  CBC: Recent Labs  Lab 02/07/20 2053 02/07/20 2055 02/10/20 0352  WBC 18.0*  --  16.1*  NEUTROABS 10.9*  --   --   HGB 14.4 15.3* 12.1  HCT 44.6 45.0 38.1  MCV 92.9  --  93.6  PLT 277  --  238    Basic Metabolic Panel: Recent Labs  Lab 02/07/20 2053 02/07/20 2055 02/10/20 0352  NA 138 137 138  K 4.4 4.2 4.1  CL 98 100 106  CO2 28  --  26  GLUCOSE 138* 139* 109*  BUN 21 24* 16  CREATININE 1.38* 1.50* 0.89  CALCIUM 9.5  --  9.1    GFR: Estimated Creatinine Clearance: 70.2 mL/min (by C-G formula based on SCr of 0.89 mg/dL).  Liver Function Tests: Recent Labs  Lab 02/07/20 2053  AST 23  ALT 126*    ALKPHOS 88  BILITOT 0.5  PROT 7.0  ALBUMIN 3.5    CBG: Recent Labs  Lab 02/10/20 0611 02/10/20 1210 02/10/20 1637 02/10/20 2105 02/11/20 0603  GLUCAP 114* 145* 94 169* 122*     Recent Results (from the past 240 hour(s))  Respiratory Panel by PCR     Status: Abnormal   Collection Time: 02/02/20  8:57 PM   Specimen: Nasopharyngeal Swab; Respiratory  Result Value Ref Range Status   Adenovirus NOT DETECTED NOT DETECTED Final   Coronavirus 229E NOT DETECTED NOT DETECTED Final    Comment: (NOTE) The Coronavirus on the Respiratory Panel, DOES NOT test for the novel  Coronavirus (2019 nCoV)    Coronavirus HKU1 NOT DETECTED NOT DETECTED Final   Coronavirus NL63 NOT DETECTED NOT DETECTED Final   Coronavirus OC43 NOT DETECTED NOT DETECTED Final   Metapneumovirus NOT DETECTED NOT DETECTED Final   Rhinovirus / Enterovirus NOT DETECTED NOT DETECTED Final   Influenza A NOT DETECTED NOT DETECTED Final   Influenza B NOT DETECTED NOT DETECTED Final   Parainfluenza Virus 1 NOT DETECTED NOT DETECTED Final   Parainfluenza Virus 2 NOT DETECTED NOT DETECTED Final   Parainfluenza Virus 3 NOT DETECTED NOT DETECTED Final   Parainfluenza Virus 4 NOT DETECTED NOT DETECTED Final   Respiratory Syncytial Virus DETECTED (A) NOT DETECTED Final   Bordetella pertussis NOT DETECTED NOT DETECTED Final   Chlamydophila pneumoniae NOT DETECTED NOT DETECTED Final   Mycoplasma pneumoniae NOT DETECTED NOT DETECTED Final    Comment: Performed at [facility], [address] [place], [place] [address]  SARS Coronavirus 2 by RT PCR (hospital order, performed in [facility] hospital lab) Nasopharyngeal Nasopharyngeal Swab     Status: None   Collection Time: 02/07/20  1:35 AM   Specimen: Nasopharyngeal Swab  Result Value Ref Range Status   SARS Coronavirus 2 NEGATIVE NEGATIVE Final    Comment: (NOTE) SARS-CoV-2 target nucleic acids are NOT DETECTED.  The SARS-CoV-2 RNA is generally detectable in upper  and lower respiratory specimens during the acute phase of infection. The lowest concentration of SARS-CoV-2 viral copies this assay can detect is 250 copies / mL. A negative result does not preclude SARS-CoV-2 infection and should not be used as the sole basis for treatment or other patient management decisions.  A negative result may occur with improper specimen collection / handling, submission of specimen other than nasopharyngeal swab, presence of viral mutation(s) within the areas targeted by this assay, and inadequate number of viral copies (<250 copies / mL). A negative result must be combined with clinical observations, patient history, and epidemiological information.  Fact Sheet for Patients:   BoilerBrush.com.cyhttps://www.fda.gov/media/136312/download  Fact Sheet for Healthcare Providers: https://pope.com/https://www.fda.gov/media/136313/download  This test is not yet approved or  cleared by the [place] FDA and has been authorized for detection and/or diagnosis of SARS-CoV-2 by FDA under an Emergency Use Authorization (EUA).  This EUA will remain in effect (meaning this test can be used) for the duration of the COVID-19 declaration under Section 564(b)(1) of the Act, 21 U.S.C. section 360bbb-3(b)(1), unless the authorization is terminated or revoked sooner.  Performed at [facility], [address] [place], [place] [address]          Radiology Studies: No results found.      Scheduled Meds: . aspirin EC  81 mg Oral Daily  . atorvastatin  40 mg Oral QHS  . enoxaparin (LOVENOX) injection  40 mg Subcutaneous Daily  . fluticasone furoate-vilanterol  1 puff Inhalation Daily   And  . umeclidinium bromide  1 puff Inhalation Daily  . insulin aspart  0-5 Units Subcutaneous QHS  . insulin aspart  0-9 Units Subcutaneous TID WC  . pantoprazole  40 mg Oral Daily  . ticagrelor  90 mg Oral BID   Continuous Infusions: . sodium chloride 75 mL/hr at 02/08/20 1235     LOS: 2 days        Kathlen ModyVijaya Ranger Petrich, MD [facility]   To contact the attending provider between 7A-7P or the covering provider during after hours 7P-7A, please log into the web site [email or web address] and access using universal  [place] password for that web site. If you do not have the password, please call the hospital operator.  02/11/2020, 11:10 AM

## 2020-02-11 NOTE — Progress Notes (Signed)
Supervising Physician: Julieanne Cotton  Patient Status: [facility] - In-pt  Subjective: Pt sitting up in bed. Anxious for procedure tomorrow but feels fine, no new c/o  Objective: Physical Exam: BP 114/64 (BP Location: Left Arm)   Pulse 68   Temp 98.2 F (36.8 C)   Resp 17   Ht 5\' 1"  (1.549 m)   Wt 95.9 kg   LMP 03/08/2013   SpO2 95%   BMI 39.95 kg/m  A&O x 4 Lungs: CTA Heart: Reg   Current Facility-Administered Medications:  .  0.9 %  sodium chloride infusion, , Intravenous, Continuous, 05/08/2013, MD, Last Rate: 75 mL/hr at 02/08/20 1235, New Bag at 02/08/20 1235 .  acetaminophen (TYLENOL) tablet 650 mg, 650 mg, Oral, Q4H PRN, 650 mg at 02/10/20 2033 **OR** acetaminophen (TYLENOL) 160 MG/5ML solution 650 mg, 650 mg, Per Tube, Q4H PRN **OR** acetaminophen (TYLENOL) suppository 650 mg, 650 mg, Rectal, Q4H PRN, Opyd, Timothy S, MD .  albuterol (PROVENTIL) (2.5 MG/3ML) 0.083% nebulizer solution 2.5 mg, 2.5 mg, Nebulization, Q6H PRN, Opyd, Timothy S, MD .  aspirin EC tablet 81 mg, 81 mg, Oral, Daily, 2034, MD, 81 mg at 02/10/20 0946 .  atorvastatin (LIPITOR) tablet 40 mg, 40 mg, Oral, QHS, 04/11/20, MD, 40 mg at 02/10/20 2139 .  dextromethorphan-guaiFENesin (MUCINEX DM) 30-600 MG per 12 hr tablet 1 tablet, 1 tablet, Oral, BID PRN, Opyd, Timothy S, MD .  enoxaparin (LOVENOX) injection 40 mg, 40 mg, Subcutaneous, Daily, Opyd, 2140, MD, 40 mg at 02/10/20 0945 .  fluticasone furoate-vilanterol (BREO ELLIPTA) 100-25 MCG/INH 1 puff, 1 puff, Inhalation, Daily, 1 puff at 02/10/20 0947 **AND** umeclidinium bromide (INCRUSE ELLIPTA) 62.5 MCG/INH 1 puff, 1 puff, Inhalation, Daily, Opyd, 04/11/20, MD, 1 puff at 02/10/20 0947 .  insulin aspart (novoLOG) injection 0-5 Units, 0-5 Units, Subcutaneous, QHS, Opyd, Timothy S, MD .  insulin aspart (novoLOG) injection 0-9 Units, 0-9 Units, Subcutaneous, TID WC, Opyd, 04/11/20, MD, 1 Units at 02/10/20 1255 .  LORazepam (ATIVAN)  tablet 1 mg, 1 mg, Oral, Q12H PRN, 04/11/20, MD, 1 mg at 02/10/20 2033 .  pantoprazole (PROTONIX) EC tablet 40 mg, 40 mg, Oral, Daily, Opyd, 2034, MD, 40 mg at 02/10/20 0946 .  senna-docusate (Senokot-S) tablet 1 tablet, 1 tablet, Oral, QHS PRN, Opyd, Timothy S, MD .  sodium chloride (OCEAN) 0.65 % nasal spray 1 spray, 1 spray, Each Nare, PRN, 04/11/20, MD, 1 spray at 02/09/20 2006 .  [COMPLETED] ticagrelor (BRILINTA) tablet 180 mg, 180 mg, Oral, Once, 180 mg at 02/09/20 1523 **FOLLOWED BY** ticagrelor (BRILINTA) tablet 90 mg, 90 mg, Oral, BID, 04/10/20, MD, 90 mg at 02/10/20 2139  Labs: CBC Recent Labs    02/10/20 0352  WBC 16.1*  HGB 12.1  HCT 38.1  PLT 238   BMET Recent Labs    02/10/20 0352  NA 138  K 4.1  CL 106  CO2 26  GLUCOSE 109*  BUN 16  CREATININE 0.89  CALCIUM 9.1   LFT No results for input(s): PROT, ALBUMIN, AST, ALT, ALKPHOS, BILITOT, BILIDIR, IBILI, LIPASE in the last 72 hours. PT/INR No results for input(s): LABPROT, INR in the last 72 hours.   Studies/Results: No results found.  Assessment/Plan: TIA with basilar artery stenosis. Scheduled for angioplasty/stent tomorrow with Dr. 04/11/20 Has been getting Brilinta loading Check P2Y12 tomorrow am pre-procedure. Procedure reviewed. Risks and benefits of cerebral angio with stent angioplasty were discussed with the patient including,  but not limited to bleeding, infection, vascular injury or contrast induced renal failure.  This interventional procedure involves the use of X-rays and because of the nature of the planned procedure, it is possible that we will have prolonged use of X-ray fluoroscopy.  Potential radiation risks to you include (but are not limited to) the following: - A slightly elevated risk for cancer  several years later in life. This risk is typically less than 0.5% percent. This risk is low in comparison to the normal incidence of human cancer, which is 33% for women  and 50% for men according to the American Cancer Society. - Radiation induced injury can include skin redness, resembling a rash, tissue breakdown / ulcers and hair loss (which can be temporary or permanent).   The likelihood of either of these occurring depends on the difficulty of the procedure and whether you are sensitive to radiation due to previous procedures, disease, or genetic conditions.   IF your procedure requires a prolonged use of radiation, you will be notified and given written instructions for further action.  It is your responsibility to monitor the irradiated area for the 2 weeks following the procedure and to notify your physician if you are concerned that you have suffered a radiation induced injury.    All of the patient's questions were answered, patient is agreeable to proceed.  Consent signed and in chart.     LOS: 2 days   I spent a total of 15 minutes in face to face in clinical consultation, greater than 50% of which was counseling/coordinating care for basilar artery stenosois  Brayton El PA-C 02/11/2020 9:33 AM

## 2020-02-12 ENCOUNTER — Encounter (HOSPITAL_COMMUNITY)
Admission: EM | Disposition: A | Discharge status: Home Health | Payer: Self-pay | Source: Home / Self Care | Attending: Internal Medicine

## 2020-02-12 ENCOUNTER — Ambulatory Visit: Payer: Medicare Other | Admitting: Family Medicine

## 2020-02-12 ENCOUNTER — Encounter (HOSPITAL_COMMUNITY): Payer: Self-pay | Admitting: Family Medicine

## 2020-02-12 ENCOUNTER — Other Ambulatory Visit (HOSPITAL_COMMUNITY): Payer: Medicare Other

## 2020-02-12 ENCOUNTER — Other Ambulatory Visit: Payer: Self-pay | Admitting: Student

## 2020-02-12 LAB — GLUCOSE, CAPILLARY
Glucose-Capillary: 112 mg/dL — ABNORMAL HIGH (ref 70–99)
Glucose-Capillary: 89 mg/dL (ref 70–99)
Glucose-Capillary: 85 mg/dL (ref 70–99)
Glucose-Capillary: 135 mg/dL — ABNORMAL HIGH (ref 70–99)

## 2020-02-12 LAB — PLATELET INHIBITION P2Y12: Platelet Function  P2Y12: 23 [PRU] — ABNORMAL LOW (ref 182–335)

## 2020-02-12 SURGERY — IR WITH ANESTHESIA
Anesthesia: General

## 2020-02-12 MED ORDER — ENOXAPARIN SODIUM 40 MG/0.4ML ~~LOC~~ SOLN: 40.0000 mg | Freq: Every day | SUBCUTANEOUS | Status: DC

## 2020-02-12 NOTE — Progress Notes (Signed)
NIR.  Patient was scheduled for an image-guided cerebral arteriogram with possible revascularization (angioplasty/stent placement) of basilar artery stenosis tentatively for this AM- however due to emergent case load patient's procedure postponed at this time per Dr. Corliss Skains. Plan for image-guided cerebral arteriogram with possible revascularization (angioplasty/stent placement) of basilar artery stenosis tomorrow 02/13/2020 in IR with Dr. Corliss Skains. Patient's diet restarted for today- patient will be NPO at midnight. Will hold Lovenox today- ok to continue with Brilinta/ASA use per Dr. Corliss Skains. Deanna Artis, RN aware of above.  Please call NIR with questions/concerns.   Brenda Boga Marillyn Goren, PA-C 02/12/2020, 10:27 AM

## 2020-02-12 NOTE — Progress Notes (Signed)
Pt returned back from pre-op. Morning meds given and Ativan per pt request.

## 2020-02-12 NOTE — Progress Notes (Signed)
PROGRESS NOTE    Brenda [name]  HYW:[id number] DOB: 15-Aug-1960 DOA: 02/07/2020 PCP: [name], MD   Chief Complaint  Patient presents with  . Code Stroke    Brief Narrative:  59 year old lady with prior history of COPD, chronic hypoxic respiratory failure on 3 L of nasal cannula oxygen at home, chronic chronic combined CHF, obstructive sleep apnea on CPAP, type 2 diabetes mellitus, stage IIIa CKD, recent admission for NSTEMI, presents to ED with left-sided weakness, slurred speech and nausea and vomiting.  Initial CT of the head without contrast negative for acute hemorrhage or infarction.  CT angiogram of the head and neck showed basilar artery stenosis.  Neurology was consulted, IR was consulted and underwent cerebral angiogram  showing high grade stenosis of the basilar artery. She will probably need stent placement. She is scheduled for elective  Angioplasty with stenting today in the basilar artery.  PT seen and examined at bedside.   Assessment & Plan:   Principal Problem:   TIA (transient ischemic attack) Active Problems:   Leukocytosis   OSA on CPAP   Type 2 diabetes, controlled, with peripheral neuropathy (HCC)   Chronic respiratory failure with hypoxia (HCC)   COPD  GOLD II/III still smoking/ 02 dep    Chronic kidney disease, stage 3a   LFT elevation   Chronic combined systolic and diastolic CHF (congestive heart failure) (HCC)   Posterior circulation TIA probably secondary to basilar artery stenosis. CT head with contrast does not  show any acute finding.  CT angiogram of the head and neck Focal high-grade stenosis of the basilar artery at the superior cerebellar artery origins.  IR consulted and plan for cerebral angiogram later today. Aspirin and plavix daily, following Plavix bolus. Continue with lipitor  daily.  Further TIA work-up with echocardiogram and MRI of the brain ordered. PT/OT/SLP evaluations ordered. Outpatient follow up with PT.  LDL is 52  And  /hemoglobin A1c is 6.2% Urine tox positive for tetrahydrocannabinol. She underwent cerebral angiogram today showing high grade stenosis of the basilar artery. She is scheduled for angioplasty with stenting in the basilar artery.    History of chronic combined systolic and diastolic heart failure Patient appears to be compensated and is on 3 L of nasal cannula oxygen.  Repeat echocardiogram reviewed showed left ventricular EF of 40 to 45%.  Continue with strict intake and output. Daily weights.  She denies any sob or chest pain.    Obstructive sleep apnea on  CPAP at night continue the same.   Chronic respiratory failure with hypoxia on 3 L of nasal cannula oxygen Probably secondary to obstructive sleep apnea, COPD,, chronic, and heart failure. No respiratory issues.  Continue with bronchodilators as needed.   Stage III CKD Creatinine appears to be at baseline at this time. Repeat labs in am.    Leukocytosis Improved to 16,000/ patient remains afebrile.  Recheck CBC in am.  Covid screening test is negative.  Urine analysis and chest x-ray are negative for infection. Probably reactive.    Type 2 DM:  CBG (last 3)  Recent Labs    02/11/20 1557 02/11/20 2102 02/12/20 0611  GLUCAP 149* 104* 112*   Resume SSI. Hemoglobin A1c  Is 6.1%.  No changes in meds.    COPD No wheezing heard.  Resume bronchodilators.    Pt reports Nose bleeding from dryness. Nasal spray ordered. Nasal bleeding resolved.    Anxiety Resume ativan.   DVT prophylaxis: Lovenox Code Status: Full code Family Communication:  None at bedside, .  Disposition:   Status is: Observation  The patient will require care spanning > 2 midnights and should be moved to inpatient because: Ongoing diagnostic testing needed not appropriate for outpatient work up, Unsafe d/c plan and Inpatient level of care appropriate due to severity of illness  Dispo: The patient is from: Home              Anticipated d/c is  to: Home              Anticipated d/c date is: 1 day              Patient currently is not medically stable to d/c.       Consultants:   Neurology  IR  Procedures: CT angiogram of the head and neck MRI of the brain Echocardiogram Cerebral angiogram  Antimicrobials: None  Subjective: No chest pain or sob. No nausea, vomiting.   Objective: Vitals:   02/11/20 1558 02/11/20 1922 02/11/20 2315 02/12/20 0441  BP: (!) 122/58 (!) 115/59 (!) 108/59 107/72  Pulse: 73 66 68 (!) 103  Resp: 18 18 17 19   Temp: 97.9 F (36.6 C) 98.1 F (36.7 C) 98.2 F (36.8 C) (!) 97.5 F (36.4 C)  TempSrc:  Oral Oral Oral  SpO2: 99% 98% 98% 100%  Weight:      Height:       No intake or output data in the 24 hours ending 02/12/20 1022 Filed Weights   02/08/20 2136  Weight: 95.9 kg    Examination:  General exam: alert and comfortable, not in distress on 3 lit of [place] oxygen.   Respiratory system: clear to auscultation, no wheezing no tachypnea,  Cardiovascular system: S1s2, RRR, no JVD, no pedal edema.  Gastrointestinal system: Abdomen is soft, non tender non distended bowel sounds wnl.  Central nervous system: Alert and oriented , non focal.   Extremities: No cyanosis.  Skin: No rashes seen.  Psychiatry: Mood is appropriate.    Data Reviewed: I have personally reviewed following labs and imaging studies  CBC: Recent Labs  Lab 02/07/20 2053 02/07/20 2055 02/10/20 0352  WBC 18.0*  --  16.1*  NEUTROABS 10.9*  --   --   HGB 14.4 15.3* 12.1  HCT 44.6 45.0 38.1  MCV 92.9  --  93.6  PLT 277  --  238    Basic Metabolic Panel: Recent Labs  Lab 02/07/20 2053 02/07/20 2055 02/10/20 0352  NA 138 137 138  K 4.4 4.2 4.1  CL 98 100 106  CO2 28  --  26  GLUCOSE 138* 139* 109*  BUN 21 24* 16  CREATININE 1.38* 1.50* 0.89  CALCIUM 9.5  --  9.1    GFR: Estimated Creatinine Clearance: 70.2 mL/min (by C-G formula based on SCr of 0.89 mg/dL).  Liver Function Tests: Recent Labs    Lab 02/07/20 2053  AST 23  ALT 126*  ALKPHOS 88  BILITOT 0.5  PROT 7.0  ALBUMIN 3.5    CBG: Recent Labs  Lab 02/11/20 0603 02/11/20 1124 02/11/20 1557 02/11/20 2102 02/12/20 0611  GLUCAP 122* 179* 149* 104* 112*     Recent Results (from the past 240 hour(s))  Respiratory Panel by PCR     Status: Abnormal   Collection Time: 02/02/20  8:57 PM   Specimen: Nasopharyngeal Swab; Respiratory  Result Value Ref Range Status   Adenovirus NOT DETECTED NOT DETECTED Final   Coronavirus 229E NOT DETECTED NOT DETECTED Final  Comment: (NOTE) The Coronavirus on the Respiratory Panel, DOES NOT test for the novel  Coronavirus (2019 nCoV)    Coronavirus HKU1 NOT DETECTED NOT DETECTED Final   Coronavirus NL63 NOT DETECTED NOT DETECTED Final   Coronavirus OC43 NOT DETECTED NOT DETECTED Final   Metapneumovirus NOT DETECTED NOT DETECTED Final   Rhinovirus / Enterovirus NOT DETECTED NOT DETECTED Final   Influenza A NOT DETECTED NOT DETECTED Final   Influenza B NOT DETECTED NOT DETECTED Final   Parainfluenza Virus 1 NOT DETECTED NOT DETECTED Final   Parainfluenza Virus 2 NOT DETECTED NOT DETECTED Final   Parainfluenza Virus 3 NOT DETECTED NOT DETECTED Final   Parainfluenza Virus 4 NOT DETECTED NOT DETECTED Final   Respiratory Syncytial Virus DETECTED (A) NOT DETECTED Final   Bordetella pertussis NOT DETECTED NOT DETECTED Final   Chlamydophila pneumoniae NOT DETECTED NOT DETECTED Final   Mycoplasma pneumoniae NOT DETECTED NOT DETECTED Final    Comment: Performed at [facility], [address] [place], [place] [address]  SARS Coronavirus 2 by RT PCR (hospital order, performed in [facility] hospital lab) Nasopharyngeal Nasopharyngeal Swab     Status: None   Collection Time: 02/07/20  1:35 AM   Specimen: Nasopharyngeal Swab  Result Value Ref Range Status   SARS Coronavirus 2 NEGATIVE NEGATIVE Final    Comment: (NOTE) SARS-CoV-2 target nucleic acids are NOT DETECTED.  The  SARS-CoV-2 RNA is generally detectable in upper and lower respiratory specimens during the acute phase of infection. The lowest concentration of SARS-CoV-2 viral copies this assay can detect is 250 copies / mL. A negative result does not preclude SARS-CoV-2 infection and should not be used as the sole basis for treatment or other patient management decisions.  A negative result may occur with improper specimen collection / handling, submission of specimen other than nasopharyngeal swab, presence of viral mutation(s) within the areas targeted by this assay, and inadequate number of viral copies (<250 copies / mL). A negative result must be combined with clinical observations, patient history, and epidemiological information.  Fact Sheet for Patients:   BoilerBrush.com.cyhttps://www.fda.gov/media/136312/download  Fact Sheet for Healthcare Providers: https://pope.com/https://www.fda.gov/media/136313/download  This test is not yet approved or  cleared by the [place] FDA and has been authorized for detection and/or diagnosis of SARS-CoV-2 by FDA under an Emergency Use Authorization (EUA).  This EUA will remain in effect (meaning this test can be used) for the duration of the COVID-19 declaration under Section 564(b)(1) of the Act, 21 U.S.C. section 360bbb-3(b)(1), unless the authorization is terminated or revoked sooner.  Performed at [facility], [address] [place], [place] [address]          Radiology Studies: No results found.      Scheduled Meds: . [MAR Hold] aspirin EC  81 mg Oral Daily  . [MAR Hold] atorvastatin  40 mg Oral QHS  . [MAR Hold] enoxaparin (LOVENOX) injection  40 mg Subcutaneous Daily  . [MAR Hold] fluticasone furoate-vilanterol  1 puff Inhalation Daily   And  . [MAR Hold] umeclidinium bromide  1 puff Inhalation Daily  . [MAR Hold] insulin aspart  0-5 Units Subcutaneous QHS  . [MAR Hold] insulin aspart  0-9 Units Subcutaneous TID WC  . [MAR Hold] pantoprazole  40 mg  Oral Daily  . [MAR Hold] ticagrelor  90 mg Oral BID   Continuous Infusions: . sodium chloride 75 mL/hr at 02/08/20 1235     LOS: 3 days  Kathlen Mody, MD [facility]   To contact the attending provider between 7A-7P or the covering provider during after hours 7P-7A, please log into the web site [email or web address] and access using universal [place] password for that web site. If you do not have the password, please call the hospital operator.  02/12/2020, 10:22 AM

## 2020-02-12 NOTE — Plan of Care (Signed)
Pt alert and oriented. Pt on [place] with humidification 3L. Tele in place. Surgery planned for 02/13/20.  Problem: Education: Goal: Knowledge of General Education information will improve Description: Including pain rating scale, medication(s)/side effects and non-pharmacologic comfort measures Outcome: Progressing   Problem: Health Behavior/Discharge Planning: Goal: Ability to manage health-related needs will improve Outcome: Progressing   Problem: Clinical Measurements: Goal: Ability to maintain clinical measurements within normal limits will improve Outcome: Progressing   Problem: Activity: Goal: Risk for activity intolerance will decrease Outcome: Progressing   Problem: Nutrition: Goal: Adequate nutrition will be maintained Outcome: Progressing   Problem: Coping: Goal: Level of anxiety will decrease Outcome: Progressing   Problem: Elimination: Goal: Will not experience complications related to bowel motility Outcome: Progressing   Problem: Pain Managment: Goal: General experience of comfort will improve Outcome: Progressing   Problem: Safety: Goal: Ability to remain free from injury will improve Outcome: Progressing   Problem: Skin Integrity: Goal: Risk for impaired skin integrity will decrease Outcome: Progressing   Problem: Education: Goal: Knowledge of disease or condition will improve Outcome: Progressing Goal: Knowledge of secondary prevention will improve Outcome: Progressing Goal: Knowledge of patient specific risk factors addressed and post discharge goals established will improve Outcome: Progressing   Problem: Coping: Goal: Will verbalize positive feelings about self Outcome: Progressing   Problem: Health Behavior/Discharge Planning: Goal: Ability to manage health-related needs will improve Outcome: Progressing   Problem: Self-Care: Goal: Ability to participate in self-care as condition permits will improve Outcome: Progressing   Problem:  Nutrition: Goal: Risk of aspiration will decrease Outcome: Progressing   Problem: Ischemic Stroke/TIA Tissue Perfusion: Goal: Complications of ischemic stroke/TIA will be minimized Outcome: Progressing

## 2020-02-12 NOTE — Progress Notes (Signed)
Patient refused CPAP for the night  

## 2020-02-13 ENCOUNTER — Inpatient Hospital Stay (HOSPITAL_COMMUNITY): Payer: Medicare Other | Admitting: Anesthesiology

## 2020-02-13 ENCOUNTER — Other Ambulatory Visit (HOSPITAL_COMMUNITY): Payer: Self-pay

## 2020-02-13 ENCOUNTER — Encounter (HOSPITAL_COMMUNITY)
Admission: EM | Disposition: A | Discharge status: Home Health | Payer: Self-pay | Source: Home / Self Care | Attending: Internal Medicine

## 2020-02-13 ENCOUNTER — Inpatient Hospital Stay (HOSPITAL_COMMUNITY): Payer: Medicare Other

## 2020-02-13 HISTORY — PX: RADIOLOGY WITH ANESTHESIA: SHX6223

## 2020-02-13 HISTORY — PX: IR US GUIDE VASC ACCESS RIGHT: IMG2390

## 2020-02-13 HISTORY — PX: IR ANGIO VERTEBRAL SEL VERTEBRAL UNI R MOD SED: IMG5368

## 2020-02-13 LAB — BASIC METABOLIC PANEL
Anion gap: 7 (ref 5–15)
BUN: 11 mg/dL (ref 8–23)
CO2: 26 mmol/L (ref 22–32)
Calcium: 9.3 mg/dL (ref 8.9–10.3)
Chloride: 105 mmol/L (ref 98–111)
Creatinine, Ser: 0.88 mg/dL (ref 0.44–1.00)
GFR calc Af Amer: >60 mL/min (ref >60)
GFR calc non Af Amer: >60 mL/min (ref >60)
Glucose, Bld: 96 mg/dL (ref 70–99)
Potassium: 4.2 mmol/L (ref 3.5–5.1)
Sodium: 138 mmol/L (ref 135–145)

## 2020-02-13 LAB — GLUCOSE, CAPILLARY
Glucose-Capillary: 132 mg/dL — ABNORMAL HIGH (ref 70–99)
Glucose-Capillary: 120 mg/dL — ABNORMAL HIGH (ref 70–99)
Glucose-Capillary: 119 mg/dL — ABNORMAL HIGH (ref 70–99)
Glucose-Capillary: 114 mg/dL — ABNORMAL HIGH (ref 70–99)

## 2020-02-13 LAB — CBC
HCT: 39.8 % (ref 36.0–46.0)
Hemoglobin: 13.1 g/dL (ref 12.0–15.0)
MCH: 30.5 pg (ref 26.0–34.0)
MCHC: 32.9 g/dL (ref 30.0–36.0)
MCV: 92.6 fL (ref 80.0–100.0)
Platelets: 254 10*3/uL (ref 150–400)
RBC: 4.3 MIL/uL (ref 3.87–5.11)
RDW: 13.2 % (ref 11.5–15.5)
WBC: 16 10*3/uL — ABNORMAL HIGH (ref 4.0–10.5)
nRBC: 0 % (ref 0.0–0.2)

## 2020-02-13 LAB — MRSA PCR SCREENING: MRSA by PCR: NEGATIVE

## 2020-02-13 LAB — POCT ACTIVATED CLOTTING TIME: Activated Clotting Time: 219 seconds

## 2020-02-13 SURGERY — IR WITH ANESTHESIA
Anesthesia: General

## 2020-02-13 MED ORDER — HYDROMORPHONE HCL 1 MG/ML IJ SOLN: 0.2500 mg | INTRAMUSCULAR | Status: DC | PRN

## 2020-02-13 MED ORDER — HEPARIN SODIUM (PORCINE) 1000 UNIT/ML IJ SOLN
INTRAMUSCULAR | Status: DC | PRN
  Administered 2020-02-13: 2000 [IU] via INTRA_ARTERIAL

## 2020-02-13 MED ORDER — IOHEXOL 300 MG/ML  SOLN: 150.0000 mL | Freq: Once | INTRAMUSCULAR | Status: DC | PRN

## 2020-02-13 MED ORDER — FENTANYL CITRATE (PF) 250 MCG/5ML IJ SOLN
INTRAMUSCULAR | Status: DC | PRN
Start: 2020-02-13 — End: 2020-02-13
  Administered 2020-02-13: 25 ug via INTRAVENOUS
  Administered 2020-02-13: 100 ug via INTRAVENOUS

## 2020-02-13 MED ORDER — NITROGLYCERIN 1 MG/10 ML FOR IR/CATH LAB
INTRA_ARTERIAL | Status: DC | PRN
  Administered 2020-02-13: 200 ug via INTRA_ARTERIAL

## 2020-02-13 MED ORDER — ACETAMINOPHEN 325 MG PO TABS: 325.0000 mg | ORAL_TABLET | ORAL | Status: DC | PRN

## 2020-02-13 MED ORDER — ONDANSETRON HCL 4 MG/2ML IJ SOLN: 4.0000 mg | Freq: Once | INTRAMUSCULAR | Status: DC | PRN

## 2020-02-13 MED ORDER — LIDOCAINE HCL 1 % IJ SOLN
INTRAMUSCULAR | Status: AC
  Filled 2020-02-13: qty 20

## 2020-02-13 MED ORDER — SUGAMMADEX SODIUM 200 MG/2ML IV SOLN
INTRAVENOUS | Status: DC | PRN
  Administered 2020-02-13: 100 mg via INTRAVENOUS
  Administered 2020-02-13: 50 mg via INTRAVENOUS
  Administered 2020-02-13: 200 mg via INTRAVENOUS
  Administered 2020-02-13: 50 mg via INTRAVENOUS

## 2020-02-13 MED ORDER — SODIUM CHLORIDE 0.9 % IV SOLN: INTRAVENOUS | Status: AC

## 2020-02-13 MED ORDER — CEFAZOLIN SODIUM-DEXTROSE 2-4 GM/100ML-% IV SOLN
INTRAVENOUS | Status: AC
  Filled 2020-02-13: qty 100

## 2020-02-13 MED ORDER — NITROGLYCERIN 1 MG/10 ML FOR IR/CATH LAB
INTRA_ARTERIAL | Status: AC
  Filled 2020-02-13: qty 10

## 2020-02-13 MED ORDER — LACTATED RINGERS IV SOLN: INTRAVENOUS | Status: DC | PRN

## 2020-02-13 MED ORDER — VERAPAMIL HCL 2.5 MG/ML IV SOLN
INTRAVENOUS | Status: AC
  Filled 2020-02-13: qty 2

## 2020-02-13 MED ORDER — SODIUM CHLORIDE 0.9 % IV SOLN: INTRAVENOUS | Status: DC

## 2020-02-13 MED ORDER — ROCURONIUM BROMIDE 10 MG/ML (PF) SYRINGE
PREFILLED_SYRINGE | INTRAVENOUS | Status: DC | PRN
  Administered 2020-02-13: 30 mg via INTRAVENOUS
  Administered 2020-02-13: 70 mg via INTRAVENOUS

## 2020-02-13 MED ORDER — CHLORHEXIDINE GLUCONATE 0.12 % MT SOLN: 15.0000 mL | Freq: Once | OROMUCOSAL | Status: AC

## 2020-02-13 MED ORDER — NIMODIPINE 30 MG PO CAPS: 0.0000 mg | ORAL_CAPSULE | ORAL | Status: DC

## 2020-02-13 MED ORDER — ACETAMINOPHEN 160 MG/5ML PO SOLN: 325.0000 mg | ORAL | Status: DC | PRN

## 2020-02-13 MED ORDER — CLEVIDIPINE BUTYRATE 0.5 MG/ML IV EMUL
INTRAVENOUS | Status: AC
  Filled 2020-02-13: qty 50

## 2020-02-13 MED ORDER — CEFAZOLIN SODIUM-DEXTROSE 2-4 GM/100ML-% IV SOLN
2.0000 g | INTRAVENOUS | Status: AC
  Administered 2020-02-13: 2 g via INTRAVENOUS
  Filled 2020-02-13: qty 100

## 2020-02-13 MED ORDER — ONDANSETRON HCL 4 MG/2ML IJ SOLN
INTRAMUSCULAR | Status: DC | PRN
  Administered 2020-02-13: 4 mg via INTRAVENOUS

## 2020-02-13 MED ORDER — PROTAMINE SULFATE 10 MG/ML IV SOLN
INTRAVENOUS | Status: DC | PRN
  Administered 2020-02-13: 5 mg via INTRAVENOUS

## 2020-02-13 MED ORDER — CHLORHEXIDINE GLUCONATE 0.12 % MT SOLN
OROMUCOSAL | Status: AC
  Administered 2020-02-13: 15 mL via OROMUCOSAL
  Filled 2020-02-13: qty 15

## 2020-02-13 MED ORDER — LACTATED RINGERS IV SOLN: INTRAVENOUS | Status: DC

## 2020-02-13 MED ORDER — VERAPAMIL HCL 2.5 MG/ML IV SOLN
INTRAVENOUS | Status: DC | PRN
  Administered 2020-02-13: 2.5 mg via INTRAVENOUS

## 2020-02-13 MED ORDER — HEPARIN SODIUM (PORCINE) 1000 UNIT/ML IJ SOLN
INTRAMUSCULAR | Status: DC | PRN
  Administered 2020-02-13: 3000 [IU] via INTRAVENOUS

## 2020-02-13 MED ORDER — HEPARIN SODIUM (PORCINE) 1000 UNIT/ML IJ SOLN
INTRAMUSCULAR | Status: AC
  Filled 2020-02-13: qty 1

## 2020-02-13 MED ORDER — LIDOCAINE HCL (PF) 1 % IJ SOLN
INTRAMUSCULAR | Status: DC | PRN
  Administered 2020-02-13: 2 mL

## 2020-02-13 MED ORDER — MIDAZOLAM HCL 5 MG/5ML IJ SOLN
INTRAMUSCULAR | Status: DC | PRN
  Administered 2020-02-13: 2 mg via INTRAVENOUS

## 2020-02-13 MED ORDER — PROPOFOL 10 MG/ML IV BOLUS
INTRAVENOUS | Status: DC | PRN
  Administered 2020-02-13: 50 mg via INTRAVENOUS
  Administered 2020-02-13: 100 mg via INTRAVENOUS
  Administered 2020-02-13: 150 mg via INTRAVENOUS

## 2020-02-13 MED ORDER — NIMODIPINE 30 MG PO CAPS
ORAL_CAPSULE | ORAL | Status: AC
  Filled 2020-02-13: qty 2

## 2020-02-13 MED ORDER — IOHEXOL 300 MG/ML  SOLN
150.0000 mL | Freq: Once | INTRAMUSCULAR | Status: AC | PRN
  Administered 2020-02-13: 75 mL via INTRA_ARTERIAL

## 2020-02-13 NOTE — Anesthesia Procedure Notes (Signed)
Arterial Line Insertion Start/End9/14/2021 8:30 AM, 02/13/2020 8:45 AM Performed by: Atilano Median, DO, Laruth Bouchard., CRNA, CRNA  Preanesthetic checklist: patient identified, IV checked, site marked, risks and benefits discussed, surgical consent, monitors and equipment checked, pre-op evaluation, timeout performed and anesthesia consent Left, radial was placed Catheter size: 20 G Hand hygiene performed  and maximum sterile barriers used  Allen's test indicative of satisfactory collateral circulation Attempts: 3 Procedure performed without using ultrasound guided technique. Ultrasound Notes:anatomy identified Following insertion, Biopatch and dressing applied. Post procedure assessment: normal  Patient tolerated the procedure well with no immediate complications. Additional procedure comments: 1 attempt SRNA, 1 attempt CRNA .

## 2020-02-13 NOTE — Progress Notes (Signed)
Per Dr. Corliss Skains hold nimodipine if BP < 150. Ok to give Ticagrelor with P2Y12 level from yesterday.

## 2020-02-13 NOTE — Anesthesia Postprocedure Evaluation (Signed)
Anesthesia Post Note  Patient: Brenda [name]  Procedure(s) Performed: Carotid Stent (N/A )     Patient location during evaluation: PACU Anesthesia Type: General Level of consciousness: awake and alert Pain management: pain level controlled Vital Signs Assessment: post-procedure vital signs reviewed and stable Respiratory status: spontaneous breathing, nonlabored ventilation, respiratory function stable and patient connected to nasal cannula oxygen Cardiovascular status: blood pressure returned to baseline and stable Postop Assessment: no apparent nausea or vomiting Anesthetic complications: no   No complications documented.  Last Vitals:  Vitals:   02/13/20 1332 02/13/20 1346  BP: 123/70 126/67  Pulse: 67 69  Resp: 15 17  Temp:    SpO2: 94% 96%    Last Pain:  Vitals:   02/13/20 1301  TempSrc:   PainSc: 0-No pain                 Earl Lites P Stoltzfus

## 2020-02-13 NOTE — Procedures (Signed)
S/P RT [facility] angiogram  RT Rad approach. Findings.  1.Patent distal basilar artery and Lt PCA P1 with a small post wall filling defect  of distal basilar artery. S.Macayla Ekdahl MD  Extubated. Awake.  Denies any H/As,N/V or visual changes. Pupils 2 to 3 mm Rt = Lt  Tongue midline. Moves all 4 equally. Rt radial pulse intact. S.Misao Fackrell MD

## 2020-02-13 NOTE — Transfer of Care (Signed)
Immediate Anesthesia Transfer of Care Note  Patient: EZME DUCH  Procedure(s) Performed: Carotid Stent (N/A )  Patient Location: PACU  Anesthesia Type:General  Level of Consciousness: awake, alert , drowsy and patient cooperative  Airway & Oxygen Therapy: Patient Spontanous Breathing and Patient connected to face mask oxygen  Post-op Assessment: Report given to RN and Post -op Vital signs reviewed and stable  Post vital signs: Reviewed and stable  Last Vitals:  Vitals Value Taken Time  BP 142/76 02/13/20 1131  Temp    Pulse 73 02/13/20 1136  Resp 14 02/13/20 1136  SpO2 98 % 02/13/20 1136  Vitals shown include unvalidated device data.  Last Pain:  Vitals:   02/13/20 0339  TempSrc: Oral  PainSc:          Complications: No complications documented.

## 2020-02-13 NOTE — Progress Notes (Signed)
NIR.  Patient underwent an image-guided diagnostic cerebral arteriogram via right radial approach today by Dr. Corliss Skains- this revealed a patent distal basilar artery and left PCA P1 with a small post wall filling defect of distal basilar artery (recanalization of basilar artery- therefore no intervention was preformed).  Went to evaluate patient bedside following procedure alongside Dr. Corliss Skains. Patient awake and alert sitting in bed with no complaints at this time. Daughter at bedside. Can spontaneously move all extremities. Right radial puncture site soft with mild ecchymosis, no tenderness, active bleeding, or hematoma; right radial pulse 2+.   Discussed results of procedure, specifically recanalization of basilar artery, and how intervention was not preformed today. Recommend follow-up with CTA head/neck (with contrast) 3 months after discharge- our schedulers to call patient to set up this imaging scan. Counseled patient on smoking cessation and importance of carb modified diet. All questions answered and concerns addressed.  Further plans per [facility]- appreciate and agree with management. NIR to follow.   Brenda Boga Tawana Pasch, PA-C 02/13/2020, 3:40 PM

## 2020-02-13 NOTE — Progress Notes (Signed)
HPI:  The patient has had a H&P performed within the last 30 days, all history, medications, and exam have been reviewed. The patient denies any interval changes since the H&P.  Medications: Prior to Admission medications   Medication Sig Start Date End Date Taking? Authorizing Provider  albuterol (PROVENTIL) (2.5 MG/3ML) 0.083% nebulizer solution INHALE 1 VIAL VIA NEBULIZER EVERY 6 HOURS AS NEEDED FOR WHEEZING/SHORTNESS OF BREATH Patient taking differently: Take 2.5 mg by nebulization every 6 (six) hours as needed for wheezing or shortness of breath.  05/11/18  Yes [name], MD  aspirin EC 81 MG EC tablet Take 1 tablet (81 mg total) by mouth daily. Swallow whole. 02/05/20  Yes Narda Bonds, MD  atorvastatin (LIPITOR) 20 MG tablet Take 20 mg by mouth at bedtime. 01/01/20  Yes [provider]  benzonatate (TESSALON) 200 MG capsule Take 1 capsule (200 mg total) by mouth 3 (three) times daily as needed for cough. Swallow whole, to not bite pill 04/22/18  Yes Parrett, Tammy S, NP  Budeson-Glycopyrrol-Formoterol (BREZTRI AEROSPHERE) 160-9-4.8 MCG/ACT AERO Inhale 2 puffs into the lungs 2 (two) times daily. 05/30/19  Yes Nyoka Cowden, MD  dextromethorphan-guaiFENesin [facility] DM) 30-600 MG 12hr tablet Take 1 tablet by mouth 2 (two) times daily as needed for cough.   Yes [provider]  escitalopram (LEXAPRO) 20 MG tablet TAKE 1 AND 1/2 TABLETS DAILY BY MOUTH Patient taking differently: Take 30 mg by mouth daily.  01/04/20  Yes [name], MD  famotidine (PEPCID) 20 MG tablet Take 20 mg by mouth at bedtime. 12/15/19  Yes [provider]  fluticasone (FLONASE) 50 MCG/ACT nasal spray Place 1 spray into both nostrils 2 (two) times daily. 05/30/19  Yes Nyoka Cowden, MD  furosemide (LASIX) 20 MG tablet Take 1 tablet (20 mg total) by mouth daily. 02/04/20  Yes Narda Bonds, MD  gabapentin (NEURONTIN) 300 MG capsule TAKE 2 TABLETS IN THE MORNING AND 3 TABLETS AT  NIGHT Patient taking differently: Take 600-900 mg by mouth See admin instructions. TAKE 2 TABLETS IN THE MORNING AND 3 TABLETS AT NIGHT 09/24/19  Yes [name], MD  LORazepam (ATIVAN) 0.5 MG tablet TAKE 1/2 TO 1 TABLET BY MOUTH TWICE A DAY AS NEEDED FOR ANXIETY Patient taking differently: Take 0.25-0.5 mg by mouth 2 (two) times daily as needed for anxiety.  01/17/20  Yes [name], MD  losartan (COZAAR) 25 MG tablet Take 0.5 tablets (12.5 mg total) by mouth daily. 02/04/20 03/05/20 Yes Narda Bonds, MD  metFORMIN (GLUCOPHAGE) 500 MG tablet TAKE 1 TABLET BY MOUTH EVERYDAY AT BEDTIME Patient taking differently: Take 500 mg by mouth daily with breakfast.  10/02/19  Yes [name], MD  metoprolol succinate (TOPROL-XL) 25 MG 24 hr tablet Take 0.5 tablets (12.5 mg total) by mouth daily. 02/05/20 03/06/20 Yes Narda Bonds, MD  Multiple Vitamin (MULTIVITAMIN WITH MINERALS) TABS tablet Take 1 tablet by mouth daily.   Yes [provider]  nystatin (MYCOSTATIN) 100000 UNIT/ML suspension TAKE 1 TEASPOONFUL BY MOUTH 3 TIMES DAILY AS NEEDED FOR MOUTH SORES. Patient taking differently: Take 5 mLs by mouth 3 (three) times daily as needed (mouth sores).  04/22/18  Yes Parrett, Tammy S, NP  pantoprazole (PROTONIX) 40 MG tablet TAKE 1 TABLET BY MOUTH EVERY DAY 30 TO 60 MINUTES BEFORE THE FIRST MEAL OF THE DAY Patient taking differently: Take 40 mg by mouth daily.  06/10/18  Yes Nyoka Cowden, MD  PROAIR RESPICLICK [phone])  MCG/ACT AEPB INHALE 2 PUFFS INTO THE LUNGS EVERY 6 HOURS AS NEEDED FOR DYSPNEA/WHEEZE Patient taking differently: Inhale 2 puffs into the lungs every 6 (six) hours as needed (wheezing or shortness of breath).  08/09/19  Yes [name], MD  promethazine-dextromethorphan (PROMETHAZINE-DM) 6.25-15 MG/5ML syrup Take 5 mLs by mouth 4 (four) times daily as needed for cough. 09/15/19  Yes Nyoka Cowden, MD  feeding supplement, GLUCERNA SHAKE, (GLUCERNA SHAKE) LIQD  Take 237 mLs by mouth 2 (two) times daily between meals. Patient not taking: Reported on 02/08/2020 02/04/20   Narda Bonds, MD     Vital Signs: BP (!) 116/58 (BP Location: Right Arm)   Pulse 67   Temp 98.6 F (37 C) (Oral)   Resp 15   Ht 5\' 1"  (1.549 m)   Wt 211 lb 6.7 oz (95.9 kg)   LMP 03/08/2013   SpO2 98%   BMI 39.95 kg/m   Physical Exam Vitals and nursing note reviewed.  Constitutional:      General: She is not in acute distress.    Appearance: Normal appearance.  Pulmonary:     Effort: Pulmonary effort is normal. No respiratory distress.  Skin:    General: Skin is warm and dry.  Neurological:     Mental Status: She is alert and oriented to person, place, and time.     Mallampati Score:  MD Evaluation Airway: WNL Heart: WNL Abdomen: WNL Chest/ Lungs: WNL ASA  Classification: 3 Mallampati/Airway Score: Two  Labs:  CBC: Recent Labs    02/03/20 1919 02/03/20 1919 02/04/20 0645 02/07/20 2053 02/07/20 2055 02/10/20 0352  WBC 10.9*  --  16.8* 18.0*  --  16.1*  HGB 12.9   < > 13.1 14.4 15.3* 12.1  HCT 39.9   < > 40.5 44.6 45.0 38.1  PLT 183  --  192 277  --  238   < > = values in this interval not displayed.    COAGS: Recent Labs    02/07/20 2053  INR 1.0  APTT 27    BMP: Recent Labs    02/03/20 1919 02/03/20 1919 02/04/20 0645 02/07/20 2053 02/07/20 2055 02/10/20 0352  NA 138   < > 139 138 137 138  K 5.2*   < > 4.6 4.4 4.2 4.1  CL 103   < > 102 98 100 106  CO2 25  --  27 28  --  26  GLUCOSE 190*   < > 126* 138* 139* 109*  BUN 24*   < > 24* 21 24* 16  CALCIUM 9.2  --  9.3 9.5  --  9.1  CREATININE 1.10*   < > 1.11* 1.38* 1.50* 0.89  GFRNONAA 54*  --  54* 41*  --  >60  GFRAA >60  --  >60 48*  --  >60   < > = values in this interval not displayed.    LIVER FUNCTION TESTS: Recent Labs    02/02/20 1446 02/03/20 1919 02/04/20 0645 02/07/20 2053  BILITOT 0.9 0.5 0.7 0.5  AST 166* 57* 35 23  ALT 924* 561* 457* 126*  ALKPHOS 86  87 76 88  PROT 6.6 6.5 6.5 7.0  ALBUMIN 3.3* 3.2* 3.1* 3.5    Assessment/Plan:   Plan for an image-guided cerebral arteriogram with possible revascularization/angioplasty/stent placement of basilar artery stenosis in IR today with Dr. 2054.  Risks and benefits of cerebral arteriogram with intervention were discussed with the patient including, but not limited to bleeding, infection,  vascular injury, contrast induced renal failure, stroke, reperfusion hemorrhage, or even death. This interventional procedure involves the use of X-rays and because of the nature of the planned procedure, it is possible that we will have prolonged use of X-ray fluoroscopy. Potential radiation risks to you include (but are not limited to) the following: - A slightly elevated risk for cancer  several years later in life. This risk is typically less than 0.5% percent. This risk is low in comparison to the normal incidence of human cancer, which is 33% for women and 50% for men according to the [employer] Society. - Radiation induced injury can include skin redness, resembling a rash, tissue breakdown / ulcers and hair loss (which can be temporary or permanent).  The likelihood of either of these occurring depends on the difficulty of the procedure and whether you are sensitive to radiation due to previous procedures, disease, or genetic conditions.  IF your procedure requires a prolonged use of radiation, you will be notified and given written instructions for further action.  It is your responsibility to monitor the irradiated area for the 2 weeks following the procedure and to notify your physician if you are concerned that you have suffered a radiation induced injury.   All of the patient's questions were answered, patient is agreeable to proceed. Consent signed and in chart.   Signed: Elwin Mocha 02/13/2020, 8:54 AM

## 2020-02-13 NOTE — Anesthesia Procedure Notes (Signed)
Procedure Name: Intubation Date/Time: 02/13/2020 9:32 AM Performed by: Laruth Bouchard., CRNA Pre-anesthesia Checklist: Patient identified, Emergency Drugs available, Suction available and Patient being monitored Patient Re-evaluated:Patient Re-evaluated prior to induction Oxygen Delivery Method: Circle system utilized Preoxygenation: Pre-oxygenation with 100% oxygen Induction Type: IV induction Ventilation: Mask ventilation without difficulty Laryngoscope Size: Glidescope and 4 Grade View: Grade I Tube type: Oral Tube size: 7.0 mm Number of attempts: 1 Airway Equipment and Method: Stylet and Oral airway Placement Confirmation: ETT inserted through vocal cords under direct vision,  positive ETCO2 and breath sounds checked- equal and bilateral Secured at: 22 cm Tube secured with: Tape Dental Injury: Teeth and Oropharynx as per pre-operative assessment  Difficulty Due To: Difficult Airway- due to limited oral opening and Difficult Airway- due to large tongue Comments: Patient with copious secretions, and anterior small airway. Placed by SRNA with Glide after attempt with Hyacinth Meeker 3 also by [facility]

## 2020-02-13 NOTE — Anesthesia Procedure Notes (Signed)
Arterial Line Insertion Start/End9/14/2021 9:00 AM Performed by: Laruth Bouchard., CRNA, CRNA  Patient location: Pre-op. Preanesthetic checklist: patient identified, IV checked, site marked, risks and benefits discussed, surgical consent, monitors and equipment checked, pre-op evaluation, timeout performed and anesthesia consent Lidocaine 1% used for infiltration Left, radial was placed Catheter size: 20 G Hand hygiene performed  and maximum sterile barriers used   Attempts: 2 Procedure performed without using ultrasound guided technique. Following insertion, dressing applied and Biopatch. Post procedure assessment: normal  Patient tolerated the procedure well with no immediate complications.

## 2020-02-13 NOTE — Progress Notes (Signed)
PT Cancellation Note  Patient Details Name: LINDEY RENZULLI MRN: [id number] DOB: 1960-06-08   Cancelled Treatment:    Reason Eval/Treat Not Completed: Patient at procedure or test/unavailable Pt off floor in OR getting stent. Will follow.   Blake Divine A Lemarcus Baggerly 02/13/2020, 1:34 PM [place], PT, DPT Acute Rehabilitation Services Pager [phone] Office [phone]

## 2020-02-13 NOTE — Progress Notes (Signed)
Patient refused use of CPAP for evening. RT will continue to monitor.  

## 2020-02-13 NOTE — Progress Notes (Signed)
PROGRESS NOTE    Brenda [name]  BZJ:[id number] DOB: [birth year] DOA: 02/07/2020 PCP: [name], MD   Chief Complaint  Patient presents with  . Code Stroke    Brief Narrative:  59 year old lady with prior history of COPD, chronic hypoxic respiratory failure on 3 L of nasal cannula oxygen at home, chronic chronic combined CHF, obstructive sleep apnea on CPAP, type 2 diabetes mellitus, stage IIIa CKD, recent admission for NSTEMI, presents to ED with left-sided weakness, slurred speech and nausea and vomiting.  Initial CT of the head without contrast negative for acute hemorrhage or infarction.  CT angiogram of the head and neck showed basilar artery stenosis.  Neurology was consulted, IR was consulted and underwent cerebral angiogram  showing high grade stenosis of the basilar artery. She will probably need stent placement. She underwent  Cerebral angiogram this am, revealing a patent distal basilar artery and left PCA P1 with a small post wall filling defect of distal basilar artery, hence no intervention was performed. IR recommended follow up with CTA of the head and neck with contrast in 3 months post discharge. Meanwhile Dr Pearlean Brownie with Neurology recommended getting a TEE and loop placement . Initially patient wanted the TEE to be done inpatient, rather than outpatient. Cardiology requested for TEE, which will be scheduled on Friday. Later this evening patient changed her mind and wanted to go home tomorrow and get TEE and Loop placement as outpatient. She said she couldn't leave today as her family left for the day.    Assessment & Plan:   Principal Problem:   TIA (transient ischemic attack) Active Problems:   Leukocytosis   OSA on CPAP   Type 2 diabetes, controlled, with peripheral neuropathy (HCC)   Chronic respiratory failure with hypoxia (HCC)   COPD  GOLD II/III still smoking/ 02 dep    Chronic kidney disease, stage 3a   LFT elevation   Chronic combined systolic and diastolic  CHF (congestive heart failure) (HCC)   Posterior circulation TIA probably secondary to basilar artery stenosis. CT head with contrast does not  show any acute finding.  CT angiogram of the head and neck Focal high-grade stenosis of the basilar artery at the superior cerebellar artery origins.  IR consulted and she was scheduled for cerebral angiogram and possible stenting of the basilar artery today.   her cerebral angiogram today showed Patent distal basilar artery and Lt PCA P1 with a small post wall filling defect of distal basilar artery.  Hence no intervention was done.  IR recommended CTA of the head neck with contrast in 3 months.  Dr Pearlean Brownie with Neurology recommended TEE and loop placement which are to be scheduled as outpatient. Will send a message to cardiology to schedule them as outpatient.    History of chronic combined systolic and diastolic heart failure Patient appears to be compensated and is on 3 L of nasal cannula oxygen.  Repeat echocardiogram reviewed showed left ventricular EF of 40 to 45%.  Continue with strict intake and output. Daily weights.  No new complaints of chest pain or sob.    Obstructive sleep apnea on  CPAP at night continue the same.   Chronic respiratory failure with hypoxia on 3 L of nasal cannula oxygen Probably secondary to obstructive sleep apnea, COPD,, chronic, and heart failure. No respiratory issues.  Continue with bronchodilators as needed.   Stage III CKD Creatinine appears to be at baseline at this time.    Leukocytosis Improved to 16,000/ patient remains  afebrile.  Covid screening test is negative.  Urine analysis and chest x-ray are negative for infection. Probably reactive.  If her wbc count stays persistently high , will need follow up with hematology for further further evaluation.    Type 2 DM: well controlled CBG'S CBG (last 3)  Recent Labs    02/13/20 0627 02/13/20 0816 02/13/20 1133  GLUCAP 132* 120* 119*   Resume  SSI. Hemoglobin A1c  Is 6.1%.  No changes in meds.    COPD No wheezing heard.  Resume bronchodilators.    Pt reports Nose bleeding from dryness.  Nasal spray ordered. Nasal bleeding resolved.    Anxiety Resume ativan.   DVT prophylaxis: Lovenox Code Status: Full code Family Communication: Daughter at bedside, .  Disposition:   Status is: Inpatient.  The patient will require care spanning > 2 midnights and should be moved to inpatient because: Ongoing diagnostic testing needed not appropriate for outpatient work up, Unsafe d/c plan and Inpatient level of care appropriate due to severity of illness  Dispo: The patient is from: Home              Anticipated d/c is to: Home              Anticipated d/c date is: 1 day              Patient currently is not medically stable to d/c.       Consultants:   Neurology  IR  Procedures: CT angiogram of the head and neck MRI of the brain Echocardiogram Cerebral angiogram  Antimicrobials: None  Subjective: No new complaints.   Objective: Vitals:   02/13/20 1301 02/13/20 1316 02/13/20 1332 02/13/20 1346  BP: 129/69 138/70 123/70 126/67  Pulse: 68 67 67 69  Resp: 15 17 15 17   Temp:    (!) 97.2 F (36.2 C)  TempSrc:      SpO2: 98% 94% 94% 96%  Weight:      Height:        Intake/Output Summary (Last 24 hours) at 02/13/2020 1430 Last data filed at 02/13/2020 1111 Gross per 24 hour  Intake 900 ml  Output 450 ml  Net 450 ml   Filed Weights   02/08/20 2136  Weight: 95.9 kg    Examination:  General exam: Alert and comfortable.  Respiratory system: Clear to auscultation, no wheezing or rhonchi.  Cardiovascular system: S1S2, RRR no JVD, no pedal edema.  Gastrointestinal system: Abdomen iis soft, non tender non distended, bowel sounds wnl.  Central nervous system: Alert , oriented, grossly non focal.  Extremities: No pedal edema or cyanosis.  Skin: No rashes seen.  Psychiatry: Mood is appropriate.    Data  Reviewed: I have personally reviewed following labs and imaging studies  CBC: Recent Labs  Lab 02/07/20 2053 02/07/20 2055 02/10/20 0352  WBC 18.0*  --  16.1*  NEUTROABS 10.9*  --   --   HGB 14.4 15.3* 12.1  HCT 44.6 45.0 38.1  MCV 92.9  --  93.6  PLT 277  --  238    Basic Metabolic Panel: Recent Labs  Lab 02/07/20 2053 02/07/20 2055 02/10/20 0352  NA 138 137 138  K 4.4 4.2 4.1  CL 98 100 106  CO2 28  --  26  GLUCOSE 138* 139* 109*  BUN 21 24* 16  CREATININE 1.38* 1.50* 0.89  CALCIUM 9.5  --  9.1    GFR: Estimated Creatinine Clearance: 70.2 mL/min (by C-G formula based on  SCr of 0.89 mg/dL).  Liver Function Tests: Recent Labs  Lab 02/07/20 2053  AST 23  ALT 126*  ALKPHOS 88  BILITOT 0.5  PROT 7.0  ALBUMIN 3.5    CBG: Recent Labs  Lab 02/12/20 1612 02/12/20 2139 02/13/20 0627 02/13/20 0816 02/13/20 1133  GLUCAP 85 135* 132* 120* 119*     Recent Results (from the past 240 hour(s))  SARS Coronavirus 2 by RT PCR (hospital order, performed in [facility] hospital lab) Nasopharyngeal Nasopharyngeal Swab     Status: None   Collection Time: 02/07/20  1:35 AM   Specimen: Nasopharyngeal Swab  Result Value Ref Range Status   SARS Coronavirus 2 NEGATIVE NEGATIVE Final    Comment: (NOTE) SARS-CoV-2 target nucleic acids are NOT DETECTED.  The SARS-CoV-2 RNA is generally detectable in upper and lower respiratory specimens during the acute phase of infection. The lowest concentration of SARS-CoV-2 viral copies this assay can detect is 250 copies / mL. A negative result does not preclude SARS-CoV-2 infection and should not be used as the sole basis for treatment or other patient management decisions.  A negative result may occur with improper specimen collection / handling, submission of specimen other than nasopharyngeal swab, presence of viral mutation(s) within the areas targeted by this assay, and inadequate number of viral copies (<250 copies / mL). A  negative result must be combined with clinical observations, patient history, and epidemiological information.  Fact Sheet for Patients:   BoilerBrush.com.cy  Fact Sheet for Healthcare Providers: https://pope.com/  This test is not yet approved or  cleared by the [place] FDA and has been authorized for detection and/or diagnosis of SARS-CoV-2 by FDA under an Emergency Use Authorization (EUA).  This EUA will remain in effect (meaning this test can be used) for the duration of the COVID-19 declaration under Section 564(b)(1) of the Act, 21 U.S.C. section 360bbb-3(b)(1), unless the authorization is terminated or revoked sooner.  Performed at [facility], [address] [place], [place] [address]   MRSA PCR Screening     Status: None   Collection Time: 02/12/20 10:28 PM   Specimen: Nasal Mucosa; Nasopharyngeal  Result Value Ref Range Status   MRSA by PCR NEGATIVE NEGATIVE Final    Comment:        The GeneXpert MRSA Assay (FDA approved for NASAL specimens only), is one component of a comprehensive MRSA colonization surveillance program. It is not intended to diagnose MRSA infection nor to guide or monitor treatment for MRSA infections. Performed at [facility], [address] [place], [place] [address]          Radiology Studies: No results found.      Scheduled Meds: . aspirin EC  81 mg Oral Daily  . atorvastatin  40 mg Oral QHS  . [START ON 02/15/2020] enoxaparin (LOVENOX) injection  40 mg Subcutaneous Daily  . fluticasone furoate-vilanterol  1 puff Inhalation Daily   And  . umeclidinium bromide  1 puff Inhalation Daily  . heparin sodium (porcine)      . insulin aspart  0-5 Units Subcutaneous QHS  . insulin aspart  0-9 Units Subcutaneous TID WC  . lidocaine      . nitroGLYCERIN      . nitroGLYCERIN      . pantoprazole  40 mg Oral Daily  . ticagrelor  90 mg Oral BID  . verapamil        Continuous Infusions: . sodium chloride 100 mL/hr at 02/12/20 1211  .  sodium chloride    . clevidipine       LOS: 4 days       Kathlen ModyVijaya Jene Huq, MD [facility]   To contact the attending provider between 7A-7P or the covering provider during after hours 7P-7A, please log into the web site [email or web address] and access using universal [place] password for that web site. If you do not have the password, please call the hospital operator.  02/13/2020, 2:30 PM

## 2020-02-14 ENCOUNTER — Telehealth: Payer: Self-pay

## 2020-02-14 ENCOUNTER — Encounter (HOSPITAL_COMMUNITY): Payer: Self-pay | Admitting: Interventional Radiology

## 2020-02-14 LAB — GLUCOSE, CAPILLARY: Glucose-Capillary: 108 mg/dL — ABNORMAL HIGH (ref 70–99)

## 2020-02-14 MED ORDER — ATORVASTATIN CALCIUM 40 MG PO TABS
40.0000 mg | ORAL_TABLET | Freq: Every day | ORAL | 2 refills | Status: DC
Start: 2020-02-14 — End: 2020-09-13

## 2020-02-14 MED ORDER — LOSARTAN POTASSIUM 25 MG PO TABS: 12.5000 mg | ORAL_TABLET | Freq: Every day | ORAL | 0 refills | Status: DC

## 2020-02-14 MED ORDER — CLOPIDOGREL BISULFATE 75 MG PO TABS
75.0000 mg | ORAL_TABLET | Freq: Every day | ORAL | 3 refills | Status: DC
Start: 2020-02-14 — End: 2020-04-29

## 2020-02-14 MED ORDER — CLOPIDOGREL BISULFATE 75 MG PO TABS
75.0000 mg | ORAL_TABLET | Freq: Every day | ORAL | Status: DC
  Administered 2020-02-14: 75 mg via ORAL
  Filled 2020-02-14: qty 1

## 2020-02-14 NOTE — Progress Notes (Signed)
Physical Therapy Treatment Patient Details Name: Brenda [name] MRN: [id number] DOB: 1961/01/11 Today's Date: 02/14/2020    History of Present Illness 59 year old lady with prior history of COPD, chronic hypoxic respiratory failure on 3 L of nasal cannula oxygen at home, chronic chronic combined CHF, obstructive sleep apnea on CPAP, type 2 diabetes mellitus, stage IIIa CKD, recent admission for NSTEMI, presents to ED with left-sided weakness, slurred speech and nausea and vomiting.  Found to have basilar artery stenosis; awaiting potential stent placement.     PT Comments    Pt progressing towards physical therapy goals. Overall tolerance for functional activity appears to be decreased compared to baseline, however overall pt managing fairly well with occasional min assist. Recommend that pt utilize [facility] at home (she already has one), until she is back to baseline of function. Pt was educated on general safety, car transfer, and continued recommendation for outpatient PT. Will continue to follow until d/c.     Follow Up Recommendations  Outpatient PT     Equipment Recommendations  Other (comment)    Recommendations for Other Services       Precautions / Restrictions Precautions Precautions: Fall Precaution Comments: monitor O2 - on 2-3 L supplemental O2 at home Restrictions Weight Bearing Restrictions: No    Mobility  Bed Mobility               General bed mobility comments: Pt sitting on R side of bed with LLE up in bed and ankle curled under her. Pt reports this is her position of comfort.   Transfers Overall transfer level: Modified independent   Transfers: Sit to/from Stand           General transfer comment: No assist required to power-up to full stand. Statically, no unsteadiness noted, however reaching out for external support to prepare for ambulation.   Ambulation/Gait Ambulation/Gait assistance: Min guard;Min assist Gait Distance (Feet): 150  Feet Assistive device: None Gait Pattern/deviations: Wide base of support;Staggering right Gait velocity: decreased Gait velocity interpretation: <1.31 ft/sec, indicative of household ambulator General Gait Details: Occasionally reaching out for external support. Reports she intermittently uses a cane at home but declines using one during session. Intermittent assist required for balance support/recovery during bouts of unsteadiness.   Stairs Stairs: Yes Stairs assistance: Min guard Stair Management: Two rails;Step to pattern;Forwards Number of Stairs: 5 General stair comments: Pt demonstrated the ability to negotiate practice steps in rehab gym without assist. Slow but generally steady with heavy reliance on rails. Hands-on guarding provided for safety.    Wheelchair Mobility    Modified Rankin (Stroke Patients Only) Modified Rankin (Stroke Patients Only) Pre-Morbid Rankin Score: No significant disability Modified Rankin: Moderately severe disability     Balance Overall balance assessment: Needs assistance Sitting-balance support: No upper extremity supported;Feet supported Sitting balance-Leahy Scale: Good     Standing balance support: No upper extremity supported Standing balance-Leahy Scale: Poor Standing balance comment: Reaching out for external support                            Cognition Arousal/Alertness: Awake/alert Behavior During Therapy: WFL for tasks assessed/performed Overall Cognitive Status: Within Functional Limits for tasks assessed                                 General Comments: A&O x4      Exercises  General Comments        Pertinent Vitals/Pain Pain Assessment: No/denies pain    Home Living                      Prior Function            PT Goals (current goals can now be found in the care plan section) Acute Rehab PT Goals Patient Stated Goal: Return home PT Goal Formulation: With  patient Time For Goal Achievement: 02/24/20 Potential to Achieve Goals: Good Progress towards PT goals: Progressing toward goals    Frequency    Min 3X/week      PT Plan Current plan remains appropriate    Co-evaluation              AM-PAC PT "6 Clicks" Mobility   Outcome Measure  Help needed turning from your back to your side while in a flat bed without using bedrails?: None Help needed moving from lying on your back to sitting on the side of a flat bed without using bedrails?: None Help needed moving to and from a bed to a chair (including a wheelchair)?: None Help needed standing up from a chair using your arms (e.g., wheelchair or bedside chair)?: None Help needed to walk in hospital room?: A Little Help needed climbing 3-5 steps with a railing? : A Little 6 Click Score: 22    End of Session Equipment Utilized During Treatment: Gait belt;Oxygen Activity Tolerance: Patient tolerated treatment well Patient left: in bed;with call bell/phone within reach Nurse Communication: Mobility status PT Visit Diagnosis: Muscle weakness (generalized) (M62.81);Other abnormalities of gait and mobility (R26.89)     Time: 7353-2992 PT Time Calculation (min) (ACUTE ONLY): 19 min  Charges:  $Gait Training: 8-22 mins                     Brenda [name], PT, DPT Acute Rehabilitation Services Pager: [phone] Office: [phone]    Brenda [name] 02/14/2020, 1:56 PM

## 2020-02-14 NOTE — Discharge Instructions (Signed)
Transient Ischemic Attack A transient ischemic attack (TIA) is a "warning stroke" that causes stroke-like symptoms that then go away quickly. The symptoms of a TIA come on suddenly, and they last less than 24 hours. Unlike a stroke, a TIA does not cause permanent damage to the brain. It is important to know the symptoms of a TIA and what to do. Seek medical care right away, even if your symptoms go away. Having a TIA is a sign that you are at higher risk for a permanent stroke. Lifestyle changes and medical treatments can help prevent a stroke. What are the causes? This condition is caused by a temporary blockage in an artery in the head or neck. The blockage does not allow the brain to get the blood supply it needs and can cause various symptoms. The blockage can be caused by:  Fatty buildup in an artery in the head or neck (atherosclerosis).  A blood clot.  Tearing of an artery (dissection).  Inflammation of an artery (vasculitis). Sometimes the cause is not known. What increases the risk? Certain factors may make you more likely to develop this condition. Some of these factors are things that you can change, such as:  Obesity.  Using products that contain nicotine or tobacco, such as cigarettes and e-cigarettes.  Taking oral birth control, especially if you also use tobacco.  Lack of physical activity.  Excessive use of alcohol.  Use of drugs, especially cocaine and methamphetamine. Other risk factors include:  High blood pressure (hypertension).  High cholesterol.  Diabetes mellitus.  Heart disease (coronary artery disease).  Atrial fibrillation.  Being over the age of 60.  Being female.  Family history of stroke.  Previous history of blood clots, stroke, TIA, or heart attack.  Sickle cell disease.  Being a woman with a history of preeclampsia.  Migraine headache.  Sleep apnea.  Chronic inflammatory diseases, such as rheumatoid arthritis or lupus.  Blood  clotting disorders (hypercoagulable state). What are the signs or symptoms? Symptoms of this condition are the same as those of a stroke, but they are temporary. The symptoms develop suddenly, and they go away quickly, usually within minutes to hours. Symptoms may include sudden:  Weakness or numbness in your face, arm, or leg, especially on one side of your body.  Trouble walking or difficulty moving your arms or legs.  Trouble speaking, understanding speech, or both (aphasia).  Vision changes in one or both eyes. These include double vision, blurred vision, or loss of vision.  Dizziness.  Confusion.  Loss of balance or coordination.  Nausea and vomiting.  Severe headache with no known cause. If possible, make note of the exact time that you last felt like your normal self and what time your symptoms started. Tell your health care provider. How is this diagnosed? This condition may be diagnosed based on:  Your symptoms and medical history.  A physical exam.  Imaging tests, usually a CT or MRI scan of the brain.  Blood tests. You may also have other tests, including:  Electrocardiogram (ECG).  Echocardiogram.  Carotid ultrasound.  A scan of the brain circulation (CT angiogram or MRI angiogram).  Continuous heart monitoring. How is this treated? The goal of treatment is to reduce the risk for a subsequent stroke. Treatment may include stroke prevention therapies such as:  Changes to diet or lifestyle to decrease your risk. Lifestyle changes may include exercising and stopping smoking.  Medicines to thin the blood (antiplatelets or anticoagulants).  Blood pressure medicines.    Medicines to reduce cholesterol.  Treating other health conditions, such as diabetes or atrial fibrillation. If testing shows that you have narrowing in the arteries to your brain, your health care provider may recommend a procedure, such as:  Carotid endarterectomy. This is a surgery to  remove the blockage from your artery.  Carotid angioplasty and stenting. This is a procedure to open or widen an artery in the neck using a metal mesh tube (stent). The stent helps keep the artery open by supporting the artery walls. Follow these instructions at home: Medicines  Take over-the-counter and prescription medicines only as told by your health care provider.  If you were told to take a medicine to thin your blood, such as aspirin or an anticoagulant, take it exactly as told by your health care provider. ? Taking too much blood-thinning medicine can cause bleeding. ? If you do not take enough blood-thinning medicine, you will not have the protection that you need against a stroke and other problems. Eating and drinking   Eat 5 or more servings of fruits and vegetables each day.  Follow instructions from your health care provider about diet. You may need to follow a certain nutrition plan to help manage risk factors for stroke, such as high blood pressure, high cholesterol, diabetes, or obesity. This may include: ? Eating a low-fat, low-salt diet. ? Including a lot of fiber in your diet. ? Limiting the amount of carbohydrates and sugar in your diet.  Limit alcohol intake to no more than 1 drink a day for nonpregnant women and 2 drinks a day for men. One drink equals 12 oz of beer, 5 oz of wine, or 1 oz of hard liquor. General instructions  Maintain a healthy weight.  Stay physically active. Try to get at least 30 minutes of exercise on most or all days.  Find out if you have sleep apnea, and seek treatment if needed.  Do not use any products that contain nicotine or tobacco, such as cigarettes and e-cigarettes. If you need help quitting, ask your health care provider.  Do not abuse drugs.  Keep all follow-up visits as told by your health care provider. This is important. Where to find more information  American Stroke Association: www.strokeassociation.org  National  Stroke Association: www.stroke.org Get help right away if:  You have chest pain or an irregular heartbeat.  You have any symptoms of stroke. The acronym BEFAST is an easy way to remember the main warning signs of stroke. ? B - Balance problems. Signs include dizziness, sudden trouble walking, or loss of balance. ? E - Eye problems. This includes trouble seeing or a sudden change in vision. ? F - Face changes. This includes sudden weakness or numbness of the face, or the face or eyelid drooping to one side. ? A - Arm weakness or numbness. This happens suddenly and usually on one side of the body. ? S - Speech problems. This includes trouble speaking or trouble understanding speech. ? T - Time. Time to call 911 or seek emergency care. Do not wait to see if symptoms will go away. Make note of the time your symptoms started.  Other signs of stroke may include: ? A sudden, severe headache with no known cause. ? Nausea or vomiting. ? Seizure. These symptoms may represent a serious problem that is an emergency. Do not wait to see if the symptoms will go away. Get medical help right away. Call your local emergency services (911 in the [place] Do   not drive yourself to the hospital. Summary  A TIA happens when an artery in the head or neck is blocked, leading to stroke-like symptoms that then go away quickly. The blockage clears before there is any permanent brain damage. A TIA is a medical emergency and requires immediate medical attention.  Symptoms of this condition are the same as those of a stroke, but they are temporary. The symptoms usually develop suddenly, and they go away quickly, usually within minutes to hours.  Having a TIA means that you are at high risk of a stroke in the near future.  Treatment may include medicines to thin the blood as well as medicines, diet changes, and lifestyle changes to manage conditions that increase the risk of another TIA or a stroke. This information is not  intended to replace advice given to you by your health care provider. Make sure you discuss any questions you have with your health care provider. Document Revised: 11/25/2018 Document Reviewed: 11/25/2018 Elsevier Patient Education  2020 [employer] Inc[employer]  Radial Site Care This sheet gives you information about how to care for yourself after your procedure. Your health care provider may also give you more specific instructions. If you have problems or questions, contact your health care provider. What can I expect after the procedure? After the procedure, it is common to have:  Bruising that usually fades within 1-2 weeks.  Tenderness at the site. Follow these instructions at home: Wound care 1. Follow instructions from your health care provider about how to take care of your insertion site. Make sure you: ? Wash your hands with soap and water before you change your bandage (dressing). If soap and water are not available, use hand sanitizer. ? Change your dressing as directed- pressure dressing removed 24 hours post-procedure (and switch for bandaid), bandaid removed 72 hours post-procedure. 2. Do not take baths, swim, or use a hot tub for 7 days post-procedure. 3. You may shower 48 hours after the procedure or as told by your health care provider. ? Gently wash the site with plain soap and water. ? Pat the area dry with a clean towel. ? Do not rub the site. This may cause bleeding. 4. Check your site every day for signs of infection. Check for: ? Redness, swelling, or pain. ? Fluid or blood. ? Warmth. ? Pus or a bad smell. Activity  Do not stoop, bend, or lift anything that is heavier than 10 lb (4.5 kg) for 2 weeks post-procedure.  Do not drive self for 2 weeks post-procedure. Contact a health care provider if you have:  A fever or chills.  You have redness, swelling, or pain around your insertion site. Get help right away if:  The catheter insertion area swells very  fast.  You pass out.  You suddenly start to sweat or your skin gets clammy.  The catheter insertion area is bleeding, and the bleeding does not stop when you hold steady pressure on the area.  The area near or just beyond the catheter insertion site becomes pale, cool, tingly, or numb. These symptoms may represent a serious problem that is an emergency. Do not wait to see if the symptoms will go away. Get medical help right away. Call your local emergency services (911 in the [place] Do not drive yourself to the hospital.  This information is not intended to replace advice given to you by your health care provider. Make sure you discuss any questions you have with your health care provider. Document  Revised: 05/31/2017 Document Reviewed: 05/31/2017 Elsevier Patient Education  2020 [employer].

## 2020-02-14 NOTE — Plan of Care (Signed)
  Problem: Education: Goal: Knowledge of General Education information will improve Description: Including pain rating scale, medication(s)/side effects and non-pharmacologic comfort measures Outcome: Adequate for Discharge   Problem: Health Behavior/Discharge Planning: Goal: Ability to manage health-related needs will improve Outcome: Adequate for Discharge   Problem: Clinical Measurements: Goal: Ability to maintain clinical measurements within normal limits will improve Outcome: Adequate for Discharge   Problem: Activity: Goal: Risk for activity intolerance will decrease Outcome: Adequate for Discharge   Problem: Nutrition: Goal: Adequate nutrition will be maintained Outcome: Adequate for Discharge   Problem: Coping: Goal: Level of anxiety will decrease Outcome: Adequate for Discharge   Problem: Elimination: Goal: Will not experience complications related to bowel motility Outcome: Adequate for Discharge   Problem: Pain Managment: Goal: General experience of comfort will improve Outcome: Adequate for Discharge   Problem: Safety: Goal: Ability to remain free from injury will improve Outcome: Adequate for Discharge   Problem: Skin Integrity: Goal: Risk for impaired skin integrity will decrease Outcome: Adequate for Discharge   Problem: Education: Goal: Knowledge of disease or condition will improve Outcome: Adequate for Discharge Goal: Knowledge of secondary prevention will improve Outcome: Adequate for Discharge Goal: Knowledge of patient specific risk factors addressed and post discharge goals established will improve Outcome: Adequate for Discharge   Problem: Coping: Goal: Will verbalize positive feelings about self Outcome: Adequate for Discharge   Problem: Coping: Goal: Will verbalize positive feelings about self Outcome: Adequate for Discharge   Problem: Health Behavior/Discharge Planning: Goal: Ability to manage health-related needs will  improve Outcome: Adequate for Discharge   Problem: Self-Care: Goal: Ability to participate in self-care as condition permits will improve Outcome: Adequate for Discharge   Problem: Nutrition: Goal: Risk of aspiration will decrease Outcome: Adequate for Discharge   Problem: Ischemic Stroke/TIA Tissue Perfusion: Goal: Complications of ischemic stroke/TIA will be minimized Outcome: Adequate for Discharge

## 2020-02-14 NOTE — TOC Transition Note (Signed)
Transition of Care [facility]) - CM/SW Discharge Note   Patient Details  Name: Brenda [name] MRN: [id number] Date of Birth: 1960/09/25  [facility] Care [facility]) CM/SW Contact:  Kermit Balo, RN Phone Number: 02/14/2020, 10:22 AM   Clinical Narrative:    Pt discharging home with [facility] services through [place]. Cory with [facility] aware of d/c.  Pt has transportation home.    Final next level of care: Home w Home Health Services Barriers to Discharge: No Barriers Identified   Patient Goals and CMS Choice   CMS Medicare.gov Compare Post Acute Care list provided to:: Patient Choice offered to / list presented to : Patient  Discharge Placement                       Discharge Plan and Services   Discharge Planning Services: CM Consult Post Acute Care Choice: Home Health                    HH Arranged: PT, OT [facility] Agency: [facility] Date [facility] Agency Contacted: 02/09/20   Representative spoke with at [facility] Agency: Kandee Keen  Social Determinants of Health (SDOH) Interventions     Readmission Risk Interventions No flowsheet data found.

## 2020-02-14 NOTE — Progress Notes (Signed)
STROKE TEAM PROGRESS NOTE   SUBJECTIVE (INTERVAL HISTORY) She is sitting up comfortably in bed.  She has no complaints.  Neurological exam unchanged.  She iwas scheduled for elective angioplasty stenting yesterday by Dr. Corliss Skains but angiogram showed a patent distal basilar artery and left PCA P1 with only a small posterior wall filling defect of distal basilar which did not require angioplasty and stenting.  Patient did well post angiogram and has no complaints.  She is waiting to go home this morning.  Vital signs are stable OBJECTIVE Temp:  [97.2 F (36.2 C)-98 F (36.7 C)] 98 F (36.7 C) (09/15 0756) Pulse Rate:  [67-84] 67 (09/15 0756) Cardiac Rhythm: Normal sinus rhythm (09/14 1956) Resp:  [10-17] 17 (09/15 0756) BP: (105-138)/(55-76) 117/59 (09/15 0756) SpO2:  [94 %-99 %] 99 % (09/15 0756) Arterial Line BP: (172-189)/(61-80) 182/80 (09/14 1332)  Recent Labs  Lab 02/13/20 0627 02/13/20 0816 02/13/20 1133 02/13/20 2108 02/14/20 0627  GLUCAP 132* 120* 119* 114* 108*   Recent Labs  Lab 02/07/20 2053 02/07/20 2055 02/10/20 0352 02/13/20 1502  NA 138 137 138 138  K 4.4 4.2 4.1 4.2  CL 98 100 106 105  CO2 28  --  26 26  GLUCOSE 138* 139* 109* 96  BUN 21 24* 16 11  CREATININE 1.38* 1.50* 0.89 0.88  CALCIUM 9.5  --  9.1 9.3   Recent Labs  Lab 02/07/20 2053  AST 23  ALT 126*  ALKPHOS 88  BILITOT 0.5  PROT 7.0  ALBUMIN 3.5   Recent Labs  Lab 02/07/20 2053 02/07/20 2055 02/10/20 0352 02/13/20 1502  WBC 18.0*  --  16.1* 16.0*  NEUTROABS 10.9*  --   --   --   HGB 14.4 15.3* 12.1 13.1  HCT 44.6 45.0 38.1 39.8  MCV 92.9  --  93.6 92.6  PLT 277  --  238 254   No results for input(s): CKTOTAL, CKMB, CKMBINDEX, TROPONINI in the last 168 hours. No results for input(s): LABPROT, INR in the last 72 hours. No results for input(s): COLORURINE, LABSPEC, PHURINE, GLUCOSEU, HGBUR, BILIRUBINUR, KETONESUR, PROTEINUR, UROBILINOGEN, NITRITE, LEUKOCYTESUR in the last 72  hours.  Invalid input(s): APPERANCEUR     Component Value Date/Time   CHOL 117 02/09/2020 0214   TRIG 139 02/09/2020 0214   HDL 37 (L) 02/09/2020 0214   CHOLHDL 3.2 02/09/2020 0214   VLDL 28 02/09/2020 0214   LDLCALC 52 02/09/2020 0214   Lab Results  Component Value Date   HGBA1C 6.1 (H) 02/10/2020      Component Value Date/Time   LABOPIA NONE DETECTED 02/08/2020 0919   COCAINSCRNUR NONE DETECTED 02/08/2020 0919   LABBENZ NONE DETECTED 02/08/2020 0919   AMPHETMU NONE DETECTED 02/08/2020 0919   THCU POSITIVE (A) 02/08/2020 0919   LABBARB NONE DETECTED 02/08/2020 0919    Recent Labs  Lab 02/07/20 2053  ETH <10    I have personally reviewed the radiological images below and agree with the radiology interpretations.  CT Angio Head W or Wo Contrast CT Angio Neck W and/or Wo Contrast 02/07/2020 IMPRESSION:  No large vessel occlusion. Plaque at the ICA origins causes less than 50% stenosis. Small caliber nondominant left vertebral artery becomes diminutive intracranially with high-grade stenosis or occlusion distally. A left PICA origin is not identified and may be congenitally absent. A small left AICA appears to be present. Focal high-grade stenosis of the basilar artery at the superior cerebellar artery origins.   CT CHEST WO CONTRAST 01/31/2020 IMPRESSION:  1. Mixed ground-glass, consolidation and tree-in-bud nodularity predominantly in the right lower lobe and to a lesser extent the posterior segment right upper lobe. Findings could reflect an acute multifocal infection or inflammatory process or sequela of aspiration.  2. Additional superimposed features of septal thickening and vascular redistribution/congestion with trace effusions may suggest some superimposed edematous change as well.  3. Mild body wall edema.  4. Aortic Atherosclerosis (ICD10-I70.0).  5. Coronary artery calcifications are present. Please note that the presence of coronary artery calcium documents the  presence of coronary artery disease, the severity of this disease and any potential stenosis cannot be assessed on this non-gated CT examination.   MR BRAIN WO CONTRAST 02/08/2020 IMPRESSION:  1. No acute intracranial abnormality.  2. Empty sella. While this finding is often incidental in nature and of no clinical significance, this can also be seen in the setting of idiopathic intracranial hypertension.  3. Otherwise unremarkable and normal brain MRI for age.  DG CHEST PORT 1 VIEW 02/08/2020 IMPRESSION:  Mild infiltrates in the lung bases with improvement since previous study.   DG Chest Portable 1 View 01/30/2020 IMPRESSION:  1. Diffuse prominent bilateral interstitial prominence consistent with pneumonitis.  2.  Borderline cardiomegaly.  No pulmonary venous congestion.  DG Swallowing Func-Speech Pathology 02/02/2020 Objective Swallowing Evaluation: Type of Study: MBS-Modified Barium Swallow Study  Patient Details Name: Festus BarrenJudy L Kraemer MRN: [id number] Date of Birth: May 17, 1961 Today's Date: 02/02/2020 Time: SLP Start Time (ACUTE ONLY): 0949 -SLP Stop Time (ACUTE ONLY): 1003 SLP Time Calculation (min) (ACUTE ONLY): 14 min Past Medical History: Past Medical History: Diagnosis Date . Acute pancreatitis  . Asthma   main dyspnea thought due to deconditioning (10/2013) . Bipolar I disorder, most recent episode (or current) manic, unspecified   pt denies this . Bronchitis, chronic obstructive (HCC) 2008 & 2009  FeV1 64% TLC 105% DLCO 55% 2008  -  FeV1 81% FeF 25-75 43% 2009 . COPD (chronic obstructive pulmonary disease) (HCC)   emphysema, not an issue per pulm (10/2013) . History of pyelonephritis 05/2012 . HTN (hypertension)  . Hx of migraines  . Knee pain, right  . Leukocytosis, unspecified  . Lumbar disc disease with radiculopathy   multilevel spondylitic changes, scoliosis, and anterolisthesis L4 not thought to currently be surg candidate (Dr. Phoebe PerchHirsch, [facility]) (Dr. Ollen BowlHarkins, [facility]) . Nicotine addiction  .  Nonspecific abnormal electrocardiogram (ECG) (EKG)  . Obesity  . Pure hyperglyceridemia  . Suicide attempt (HCC) 06/2001 . Swelling of limb  . Urge and stress incontinence 07/2012  ([facility]) Past Surgical History: Past Surgical History: Procedure Laterality Date . CHOLECYSTECTOMY  07/1982 . CT MAXILLOFACIAL WO/W CM  06/14/06  Left max mucocele . CT of chest  06/14/2006  Normal except c/w active inflammation / infection.  Left renal atrophy . ERCP / sphincterotomy - stenosis  01/15/06 . lumbar MRI  11/2010  multilevel spondylitic changes with upper lumbar scoliosis and anterolisthesis of L4 on 5 with some L foraminal narrowing esp at L/3 with concavity of scoliosis, some central stenosis and biforaminal narrowing at L4/5 with spondylolisthesis . [place] - Pneumonia, acute asthma, left maxillary sinusitis  01/11 - 06/18/2006 . Retained stone    ERCP secondary to retained stone . Right knee surgery  1984,1989,1989,1991,1994  Reconstructions for ACL insuff . TUBAL LIGATION   HPI: Pt is a 59 y.o. female with medical history significant of chronic hypoxic respiratory failure on 2 to 3 L baseline O2 and CPAP at bedtime, COPD Gold stage III (FEV1 44%,  improved with bronchodilator to 50%, 2019), chronic cigarette smoker 5-10 cigarettes a day hypertension, OSA on CPAP at bedtime, IIDM, diabetic neuropathy, chronic pain, depression/anxiety, morbid obesity who presented to the ED with increasing short of breath. Rapid response on 9/1 due to respiratory distress and was placed on non rebreather mask. CT chest 9/1: Mixed ground-glass, consolidation and tree-in-bud nodularity predominantly in the right lower lobe and to a lesser extent the posterior segment right upper lobe. Findings could reflect an acute multifocal infection or inflammatory process or sequela of aspiration. Pt reported "getting choked on food often" to Dr. Katrinka Blazing.  No data recorded Assessment / Plan / Recommendation CHL IP CLINICAL IMPRESSIONS 02/02/2020 Clinical  Impression Pt's oropharyngeal swallow mechanism was within functional limits with transient penetration (PAS 2) of thin liquids when consecutive swallows were used. Esophageal screening revealed backflow of material from the lower to upper thoracic esophagus and mild esophageal stasis. Backflow to the pharynx was not observed while the fluoro was on. However, pt demonstrated subsequent coughing and the possibility of aspiration secondary to backflowed material reaching the cervical esophagus and pharynx is suspected. Considering the results of the esophageal screening combined with her reported symptoms, assessment (e.g., esophagram) of the esophageal phase of her swallow is recommended. Pt was educated regarding results and recommendations as well as strategies to reduce aspiration risk during episodes of dyspnea. She verbalized understanding. Further skilled SLP services are not clinically indicated at this time.  SLP Visit Diagnosis Dysphagia, unspecified (R13.10) Attention and concentration deficit following -- Frontal lobe and executive function deficit following -- Impact on safety and function Mild aspiration risk   CHL IP TREATMENT RECOMMENDATION 02/02/2020 Treatment Recommendations No treatment recommended at this time   Prognosis 02/02/2020 Prognosis for Safe Diet Advancement Good Barriers to Reach Goals -- Barriers/Prognosis Comment -- CHL IP DIET RECOMMENDATION 02/02/2020 SLP Diet Recommendations Regular solids;Thin liquid Liquid Administration via Cup;Straw Medication Administration Whole meds with liquid Compensations Slow rate;Small sips/bites Postural Changes --   No flowsheet data found.  CHL IP FOLLOW UP RECOMMENDATIONS 02/02/2020 Follow up Recommendations None   CHL IP FREQUENCY AND DURATION 02/02/2020 Speech Therapy Frequency (ACUTE ONLY) min 2x/week Treatment Duration 1 week      CHL IP ORAL PHASE 02/02/2020 Oral Phase WFL Oral - Pudding Teaspoon -- Oral - Pudding Cup -- Oral - Honey Teaspoon -- Oral -  Honey Cup -- Oral - Nectar Teaspoon -- Oral - Nectar Cup -- Oral - Nectar Straw -- Oral - Thin Teaspoon -- Oral - Thin Cup -- Oral - Thin Straw -- Oral - Puree -- Oral - Mech Soft -- Oral - Regular -- Oral - Multi-Consistency -- Oral - Pill -- Oral Phase - Comment --  CHL IP PHARYNGEAL PHASE 02/02/2020 Pharyngeal Phase WFL Pharyngeal- Pudding Teaspoon -- Pharyngeal -- Pharyngeal- Pudding Cup -- Pharyngeal -- Pharyngeal- Honey Teaspoon -- Pharyngeal -- Pharyngeal- Honey Cup -- Pharyngeal -- Pharyngeal- Nectar Teaspoon -- Pharyngeal -- Pharyngeal- Nectar Cup -- Pharyngeal -- Pharyngeal- Nectar Straw -- Pharyngeal -- Pharyngeal- Thin Teaspoon -- Pharyngeal -- Pharyngeal- Thin Cup -- Pharyngeal -- Pharyngeal- Thin Straw Penetration/Aspiration during swallow Pharyngeal Material enters airway, remains ABOVE vocal cords then ejected out Pharyngeal- Puree -- Pharyngeal -- Pharyngeal- Mechanical Soft -- Pharyngeal -- Pharyngeal- Regular -- Pharyngeal -- Pharyngeal- Multi-consistency -- Pharyngeal -- Pharyngeal- Pill -- Pharyngeal -- Pharyngeal Comment --  CHL IP CERVICAL ESOPHAGEAL PHASE 02/02/2020 Cervical Esophageal Phase (No Data) Pudding Teaspoon -- Pudding Cup -- Honey Teaspoon -- Honey Cup -- Nectar Teaspoon --  Nectar Cup -- [employer] -- Thin Teaspoon -- Thin Cup -- Thin Straw -- Puree -- Mechanical Soft -- Regular -- Multi-consistency -- Pill -- Cervical Esophageal Comment -- Shanika I. Vear Clock, MS, CCC-SLP Acute Rehabilitation Services Office number [phone] Pager [phone] Scheryl Marten 02/02/2020, 10:28 AM              ECHOCARDIOGRAM COMPLETE 01/31/2020 IMPRESSIONS   1. Technically difficult study, even with Definity contrast. There is no left ventricular thrombus. There is relatively preserved systolic function in the basal segments. Findings suggest stress cardiomyopathy (takotsubo sd.), but could well represent multivessel CAD. Left ventricular ejection fraction, by estimation, is 30 to 35%. The  left ventricle has moderate to severely decreased function. The left ventricle demonstrates global hypokinesis. The left ventricular internal cavity size was mildly dilated. Left ventricular diastolic parameters are consistent with Grade II diastolic dysfunction (pseudonormalization). Elevated left atrial pressure.   2. Right ventricular systolic function was not well visualized. The right ventricular size is normal. Tricuspid regurgitation signal is inadequate for assessing PA pressure.   3. The mitral valve is grossly normal. No evidence of mitral valve regurgitation. No evidence of mitral stenosis.   4. The aortic valve is grossly normal. Aortic valve regurgitation is not visualized. No aortic stenosis is present. Comparison(s): Prior images reviewed side by side. The left ventricular function is significantly worse. The left ventricular wall motion abnormalities are new. Consider serial studies, with Definity.   ECHOCARDIOGRAM LIMITED BUBBLE STUDY 02/08/2020 IMPRESSIONS   1. Left ventricular ejection fraction, by estimation, is 40 to 45%. The left ventricle has mildly decreased function. The left ventricle demonstrates global hypokinesis. There is mild left ventricular hypertrophy. Left ventricular diastolic parameters are indeterminate.   2. Right ventricular systolic function was not well visualized. The right ventricular size is not well visualized.   3. The mitral valve is grossly normal. No evidence of mitral valve regurgitation. No evidence of mitral stenosis.   4. The aortic valve is grossly normal. Aortic valve regurgitation is not visualized. No aortic stenosis is present.   5. Aortic dilatation noted. There is mild dilatation of the ascending aorta, measuring 39 mm.  Comparison(s): Changes from prior study are noted. LVEF somewhat improved.   CT HEAD CODE STROKE WO CONTRAST 02/07/2020 IMPRESSION:  No acute intracranial hemorrhage or evidence of acute infarction. ASPECT score is 10.  US  Abdomen Limited RUQ 01/30/2020 IMPRESSION:  Hepatic steatosis.  No focal hepatic lesions. Sequela of cholecystectomy.   DG ESOPHAGUS W SINGLE CM (SOL OR THIN BA) 02/02/2020 CLINICAL DATA:  Shortness of breath weakness. IMPRESSION:  1. Limited study without abnormality aside from signs of mild-to-moderate dysmotility. If there are continued symptoms that would suggest esophageal pathology a study as an outpatient could be performed for more complete assessment when the patient has recovered from this episode of acute illness.    PHYSICAL EXAM     Temp:  [97.2 F (36.2 C)-98 F (36.7 C)] 98 F (36.7 C) (09/15 0756) Pulse Rate:  [67-84] 67 (09/15 0756) Resp:  [10-17] 17 (09/15 0756) BP: (105-138)/(55-76) 117/59 (09/15 0756) SpO2:  [94 %-99 %] 99 % (09/15 0756) Arterial Line BP: (172-189)/(61-80) 182/80 (09/14 1332)  General -obese middle-aged Caucasian lady in no apparent distress.  Ophthalmologic - fundi not visualized due to noncooperation.  Cardiovascular - Regular rhythm and rate.  Neurological Exam ;  Awake  Alert oriented x 3. Normal speech and language.eye movements full without nystagmus.fundi were not visualized. Vision acuity and fields appear  normal. Hearing is normal. Palatal movements are normal. Face symmetric. Tongue midline. Normal strength, tone, reflexes and coordination. Normal sensation. Gait deferred.  ASSESSMENT/PLAN Ms. ZALMA CHANNING is a 59 y.o. female with history of hypertension, hyperlipidemia, diabetes, COPD, OSA on CPAP, morbid obesity, current smoker admitted for episode of left-sided weakness, nausea vomiting, slurred speech and double vision. No tPA given due to also window and symptom resolved.    TIA: Posterior circulation TIA secondary to distal basilar artery high grade stenosis  CT no acute finding.    CTA head and neck mid basilar artery stenosis.  Left VA hypoplastic versus stenosis.  Atherosclerosis bilateral ICA bulbs.  MRI no acute  infarct  IR - high grade stenosis of BA  2D Echo EF 40-45%, 01/31/2020 EF 30 to 35%  LDL 52  HgbA1c 6.2  UDS positive for THC  Lovenox for VTE prophylaxis  aspirin 81 mg daily prior to admission, now on aspirin  and brilinta  bid    Patient counseled to be compliant with her antithrombotic medications  Ongoing aggressive stroke risk factor management  Therapy recommendations: Outpt PT recommended  Disposition: Pending  Cardiomyopathy Recent NSTEMI  Admitted 8/31-9/5 for COPD exacerbation, RSV pneumonia and respiratory failure  Found to have non-STEMI and EF 30 to 35%  Was treated with heparin drip and aspirin  Discharged with aspirin statin and metoprolol as well as losartan  This admission 2D echo EF 40-45%, improved  AKI  Creatinine 1.1->1.38->1.50->0.89  On IV fluid  Diabetes  HgbA1c 6.2 goal < 7.0  Controlled  CBG monitoring  SSI  DM education and close PCP follow up  Hypotension History of hypertension . BP soft 100-120s . BP goal 120-150 given based artery stenosis . On IV fluid . Avoid low BP  Hyperlipidemia  Home meds: Lipitor 20  LDL 52, goal < 70  Now on Lipitor 40  Continue statin at discharge  Tobacco abuse  Current smoker  Smoking cessation counseling provided  Pt is willing to quit   Other Stroke Risk Factors  Morbid obesity, BMI 41.17  Obstructive sleep apnea, on CPAP at home  THC abuse, cessation education provided  Other Active Problems  Frequent COPD exacerbation  Leukocytosis WBC 10.9->16.8->18.0->16.1 (afebrile)  Aortic Atherosclerosis (ICD10-I70.0)  PLAN    Continue aspirin and Brilinta.  If patient's insurance will cover otherwise change to aspirin and Plavix.  Maintain aggressive risk factor modification.  Discussed with Dr. Corliss Skains and Dr. Rito Ehrlich.  Follow-up as an outpatient stroke clinic in 6 weeks.  Greater than 50% time during this 25-minute visit was spent in counseling and  coordination of care about her basilar artery stenosis and TIA and answering questions.  Stroke team will sign off.  Kindly call for questions  Delia Heady, MD    Hospital day # 5          To contact Stroke Continuity provider, please refer to [email or web address]. After hours, contact General Neurology

## 2020-02-14 NOTE — Telephone Encounter (Signed)
Transition Care Management Follow-up Telephone Call  Date of discharge and from where: 02/14/2020, [facility]  How have you been since you were released from the hospital? Patient states that she is feeling much better.  Any questions or concerns? No  Items Reviewed:  Did the pt receive and understand the discharge instructions provided? Yes   Medications obtained and verified? Yes   Any new allergies since your discharge? No   Dietary orders reviewed? Yes  Do you have support at home? Yes   Functional Questionnaire: (I = Independent and D = Dependent) ADLs: I  Bathing/Dressing- I  Meal Prep- I  Eating- I  Maintaining continence- I  Transferring/Ambulation- I  Managing Meds- I  Follow up appointments reviewed:   PCP Hospital f/u appt confirmed? No  Patient will call back next week to schedule an appointment   Specialist Hospital f/u appt confirmed? No  will schedule at later date   Are transportation arrangements needed? No   If their condition worsens, is the pt aware to call PCP or go to the Emergency Dept.? Yes  Was the patient provided with contact information for the PCP's office or ED? Yes  Was to pt encouraged to call back with questions or concerns? Yes

## 2020-02-14 NOTE — TOC CAGE-AID Note (Signed)
Transition of Care [facility]) - CAGE-AID Screening   Patient Details  Name: Brenda [name] MRN: [id number] Date of Birth: [birth year]  Transition of [facility]) CM/SW Contact:    Emeterio Reeve, [place] Phone Number: 02/14/2020, 12:36 PM   Clinical Narrative:  CSW met with pt at bedside. CSW introduced self and explained her role at the hospital.  Pt denied alcohol use. Pt reports marijuana use to help with back pain. Pt reports she will stop use on her own. Pt declined resources at this time.   CAGE-AID Screening:    Have You Ever Felt You Ought to Cut Down on Your Drinking or Drug Use?: No Have People Annoyed You By Critizing Your Drinking Or Drug Use?: No Have You Felt Bad Or Guilty About Your Drinking Or Drug Use?: No Have You Ever Had a Drink or Used Drugs First Thing In The Morning to Steady Your Nerves or to Get Rid of a Hangover?: No CAGE-AID Score: 0  Substance Abuse Education Offered: Yes  Substance abuse interventions: Patient Counseling   Emeterio Reeve, Latanya Presser, [place] Social Worker [phone]

## 2020-02-14 NOTE — Care Management Important Message (Signed)
Important Message  Patient Details  Name: SHAKYIA BOSSO MRN: [id number] Date of Birth: 1961/02/03   Medicare Important Message Given:  Yes - Important Message mailed due to current National Emergency  Verbal consent obtained due to current National Emergency  Relationship to patient: Self Contact Name: Katheryn Culliton Call Date: 02/14/20  Time: 1343 Phone: [phone] Outcome: No Answer/Busy Important Message mailed to: Patient address on file    Orson Aloe 02/14/2020, 1:43 PM

## 2020-02-14 NOTE — Progress Notes (Signed)
Referring Physician(s): Marvel Plan (neurology)  Supervising Physician: Julieanne Cotton  Patient Status:  [facility] - In-pt  Chief Complaint: None  Subjective:  History of TIA s/p cerebral arteriogram via right radial approach revealing a patent distal basilar artery and left PCA P1 with a small post wall filling defectof distal basilar artery (recanalization of basilar artery- therefore no intervention was preformed) 02/13/2020 by Dr. Corliss Skains. Patient awake and alert sitting in bed with no complaints at this time. States she "slept better last night than any night the past 2 weeks". Can spontaneously move all extremities. Right radial puncture site c/d/i.   Allergies: Metolazone  Medications: Prior to Admission medications   Medication Sig Start Date End Date Taking? Authorizing Provider  albuterol (PROVENTIL) (2.5 MG/3ML) 0.083% nebulizer solution INHALE 1 VIAL VIA NEBULIZER EVERY 6 HOURS AS NEEDED FOR WHEEZING/SHORTNESS OF BREATH Patient taking differently: Take 2.5 mg by nebulization every 6 (six) hours as needed for wheezing or shortness of breath.  05/11/18  Yes [name], MD  aspirin EC 81 MG EC tablet Take 1 tablet (81 mg total) by mouth daily. Swallow whole. 02/05/20  Yes Narda Bonds, MD  atorvastatin (LIPITOR) 20 MG tablet Take 20 mg by mouth at bedtime. 01/01/20  Yes [provider]  benzonatate (TESSALON) 200 MG capsule Take 1 capsule (200 mg total) by mouth 3 (three) times daily as needed for cough. Swallow whole, to not bite pill 04/22/18  Yes Parrett, Tammy S, NP  Budeson-Glycopyrrol-Formoterol (BREZTRI AEROSPHERE) 160-9-4.8 MCG/ACT AERO Inhale 2 puffs into the lungs 2 (two) times daily. 05/30/19  Yes Nyoka Cowden, MD  dextromethorphan-guaiFENesin [facility] DM) 30-600 MG 12hr tablet Take 1 tablet by mouth 2 (two) times daily as needed for cough.   Yes [provider]  escitalopram (LEXAPRO) 20 MG tablet TAKE 1 AND 1/2 TABLETS DAILY BY  MOUTH Patient taking differently: Take 30 mg by mouth daily.  01/04/20  Yes [name], MD  famotidine (PEPCID) 20 MG tablet Take 20 mg by mouth at bedtime. 12/15/19  Yes [provider]  fluticasone (FLONASE) 50 MCG/ACT nasal spray Place 1 spray into both nostrils 2 (two) times daily. 05/30/19  Yes Nyoka Cowden, MD  furosemide (LASIX) 20 MG tablet Take 1 tablet (20 mg total) by mouth daily. 02/04/20  Yes Narda Bonds, MD  gabapentin (NEURONTIN) 300 MG capsule TAKE 2 TABLETS IN THE MORNING AND 3 TABLETS AT NIGHT Patient taking differently: Take 600-900 mg by mouth See admin instructions. TAKE 2 TABLETS IN THE MORNING AND 3 TABLETS AT NIGHT 09/24/19  Yes [name], MD  LORazepam (ATIVAN) 0.5 MG tablet TAKE 1/2 TO 1 TABLET BY MOUTH TWICE A DAY AS NEEDED FOR ANXIETY Patient taking differently: Take 0.25-0.5 mg by mouth 2 (two) times daily as needed for anxiety.  01/17/20  Yes [name], MD  losartan (COZAAR) 25 MG tablet Take 0.5 tablets (12.5 mg total) by mouth daily. 02/04/20 03/05/20 Yes Narda Bonds, MD  metFORMIN (GLUCOPHAGE) 500 MG tablet TAKE 1 TABLET BY MOUTH EVERYDAY AT BEDTIME Patient taking differently: Take 500 mg by mouth daily with breakfast.  10/02/19  Yes [name], MD  metoprolol succinate (TOPROL-XL) 25 MG 24 hr tablet Take 0.5 tablets (12.5 mg total) by mouth daily. 02/05/20 03/06/20 Yes Narda Bonds, MD  Multiple Vitamin (MULTIVITAMIN WITH MINERALS) TABS tablet Take 1 tablet by mouth daily.   Yes [provider]  nystatin (MYCOSTATIN) 100000 UNIT/ML suspension TAKE 1 TEASPOONFUL BY MOUTH 3  TIMES DAILY AS NEEDED FOR MOUTH SORES. Patient taking differently: Take 5 mLs by mouth 3 (three) times daily as needed (mouth sores).  04/22/18  Yes Parrett, Tammy S, NP  pantoprazole (PROTONIX) 40 MG tablet TAKE 1 TABLET BY MOUTH EVERY DAY 30 TO 60 MINUTES BEFORE THE FIRST MEAL OF THE DAY Patient taking differently: Take 40 mg by mouth daily.  06/10/18   Yes Nyoka Cowden, MD  PROAIR RESPICLICK 108 (90 Base) MCG/ACT AEPB INHALE 2 PUFFS INTO THE LUNGS EVERY 6 HOURS AS NEEDED FOR DYSPNEA/WHEEZE Patient taking differently: Inhale 2 puffs into the lungs every 6 (six) hours as needed (wheezing or shortness of breath).  08/09/19  Yes [name], MD  promethazine-dextromethorphan (PROMETHAZINE-DM) 6.25-15 MG/5ML syrup Take 5 mLs by mouth 4 (four) times daily as needed for cough. 09/15/19  Yes Nyoka Cowden, MD  feeding supplement, GLUCERNA SHAKE, (GLUCERNA SHAKE) LIQD Take 237 mLs by mouth 2 (two) times daily between meals. Patient not taking: Reported on 02/08/2020 02/04/20   Narda Bonds, MD     Vital Signs: BP (!) 117/59 (BP Location: Left Arm)   Pulse 67   Temp 98 F (36.7 C) (Oral)   Resp 17   Ht 5\' 1"  (1.549 m)   Wt 211 lb 6.7 oz (95.9 kg)   LMP 03/08/2013   SpO2 99%   BMI 39.95 kg/m   Physical Exam Vitals and nursing note reviewed.  Constitutional:      General: She is not in acute distress. Pulmonary:     Effort: Pulmonary effort is normal. No respiratory distress.  Skin:    General: Skin is warm and dry.     Comments: Right radial puncture site soft with mild ecchymosis (Iess than last evening), no tenderness, active bleeding, or hematoma.  Neurological:     Mental Status: She is alert.     Comments: Alert, awake, and oriented x3. Speech and comprehension intact. PERRL bilaterally. No facial asymmetry. Tongue midline. Can spontaneously move all extremities. No pronator drift. Fine motor and coordination intact and symmetric. Distal pulses (radial) 2+ bilaterally.     Imaging: No results found.  Labs:  CBC: Recent Labs    02/04/20 0645 02/04/20 0645 02/07/20 2053 02/07/20 2055 02/10/20 0352 02/13/20 1502  WBC 16.8*  --  18.0*  --  16.1* 16.0*  HGB 13.1   < > 14.4 15.3* 12.1 13.1  HCT 40.5   < > 44.6 45.0 38.1 39.8  PLT 192  --  277  --  238 254   < > = values in this interval not displayed.     COAGS: Recent Labs    02/07/20 2053  INR 1.0  APTT 27    BMP: Recent Labs    02/04/20 0645 02/04/20 0645 02/07/20 2053 02/07/20 2055 02/10/20 0352 02/13/20 1502  NA 139   < > 138 137 138 138  K 4.6   < > 4.4 4.2 4.1 4.2  CL 102   < > 98 100 106 105  CO2 27  --  28  --  26 26  GLUCOSE 126*   < > 138* 139* 109* 96  BUN 24*   < > 21 24* 16 11  CALCIUM 9.3  --  9.5  --  9.1 9.3  CREATININE 1.11*   < > 1.38* 1.50* 0.89 0.88  GFRNONAA 54*  --  41*  --  >60 >60  GFRAA >60  --  48*  --  >60 >60   < > =  values in this interval not displayed.    LIVER FUNCTION TESTS: Recent Labs    02/02/20 1446 02/03/20 1919 02/04/20 0645 02/07/20 2053  BILITOT 0.9 0.5 0.7 0.5  AST 166* 57* 35 23  ALT 924* 561* 457* 126*  ALKPHOS 86 87 76 88  PROT 6.6 6.5 6.5 7.0  ALBUMIN 3.3* 3.2* 3.1* 3.5    Assessment and Plan:  History of TIA s/p cerebral arteriogram via right radial approach revealing a patent distal basilar artery and left PCA P1 with a small post wall filling defectof distal basilar artery (recanalization of basilar artery- therefore no intervention was preformed) 02/13/2020 by Dr. Corliss Skains. Patient's condition stable- no focal neurologic symptoms noted. Right radial puncture site stable, distal pulses (radial) 2+ bilaterally- no submerging (swimming/bathing) right wrist for 7 days post-procedure. Recommend NIR follow-up with CTA head/neck (with contrast) 3 months after discharge- our schedulers to call patient to set up this imaging scan. From NIR standpoint, patient stable for discharge. Further plans per [facility]/neurology- appreciate and agree with management. Please call NIR with questions/concerns.   Electronically Signed: Elwin Mocha, PA-C 02/14/2020, 9:25 AM   I spent a total of 15 Minutes at the the patient's bedside AND on the patient's hospital floor or unit, greater than 50% of which was counseling/coordinating care for TIA.

## 2020-02-14 NOTE — Progress Notes (Signed)
Nsg Discharge Note  Admit Date:  02/07/2020 Discharge date: 02/14/2020   [name] to be D/C'd Home per MD order.  AVS completed.  Copy for chart, and copy for patient signed, and dated. Patient/caregiver able to verbalize understanding.  Discharge Medication: Allergies as of 02/14/2020      Reactions   Metolazone Other (See Comments)   Metabolic mood swings      Medication List    STOP taking these medications   feeding supplement (GLUCERNA SHAKE) Liqd     TAKE these medications   albuterol (2.5 MG/3ML) 0.083% nebulizer solution Commonly known as: PROVENTIL INHALE 1 VIAL VIA NEBULIZER EVERY 6 HOURS AS NEEDED FOR WHEEZING/SHORTNESS OF BREATH What changed: See the new instructions.   ProAir RespiClick 108 (90 Base) MCG/ACT Aepb Generic drug: Albuterol Sulfate INHALE 2 PUFFS INTO THE LUNGS EVERY 6 HOURS AS NEEDED FOR DYSPNEA/WHEEZE What changed: See the new instructions.   aspirin 81 MG EC tablet Take 1 tablet (81 mg total) by mouth daily. Swallow whole.   atorvastatin 40 MG tablet Commonly known as: LIPITOR Take 1 tablet (40 mg total) by mouth at bedtime. What changed:   medication strength  how much to take   benzonatate 200 MG capsule Commonly known as: TESSALON Take 1 capsule (200 mg total) by mouth 3 (three) times daily as needed for cough. Swallow whole, to not bite pill   Breztri Aerosphere 160-9-4.8 MCG/ACT Aero Generic drug: Budeson-Glycopyrrol-Formoterol Inhale 2 puffs into the lungs 2 (two) times daily.   clopidogrel 75 MG tablet Commonly known as: PLAVIX Take 1 tablet (75 mg total) by mouth daily.   dextromethorphan-guaiFENesin 30-600 MG 12hr tablet Commonly known as: MUCINEX DM Take 1 tablet by mouth 2 (two) times daily as needed for cough.   escitalopram 20 MG tablet Commonly known as: LEXAPRO TAKE 1 AND 1/2 TABLETS DAILY BY MOUTH What changed: See the new instructions.   famotidine 20 MG tablet Commonly known as: PEPCID Take 20 mg by  mouth at bedtime.   fluticasone 50 MCG/ACT nasal spray Commonly known as: FLONASE Place 1 spray into both nostrils 2 (two) times daily.   furosemide 20 MG tablet Commonly known as: LASIX Take 1 tablet (20 mg total) by mouth daily.   gabapentin 300 MG capsule Commonly known as: NEURONTIN TAKE 2 TABLETS IN THE MORNING AND 3 TABLETS AT NIGHT What changed:   how much to take  how to take this  when to take this   LORazepam 0.5 MG tablet Commonly known as: ATIVAN TAKE 1/2 TO 1 TABLET BY MOUTH TWICE A DAY AS NEEDED FOR ANXIETY What changed: See the new instructions.   losartan 25 MG tablet Commonly known as: Cozaar Take 0.5 tablets (12.5 mg total) by mouth daily. RESUME ON 02/18/20 Start taking on: February 18, 2020 What changed:   additional instructions  These instructions start on February 18, 2020. If you are unsure what to do until then, ask your doctor or other care provider.   metFORMIN 500 MG tablet Commonly known as: GLUCOPHAGE TAKE 1 TABLET BY MOUTH EVERYDAY AT BEDTIME What changed: See the new instructions.   metoprolol succinate 25 MG 24 hr tablet Commonly known as: TOPROL-XL Take 0.5 tablets (12.5 mg total) by mouth daily.   multivitamin with minerals Tabs tablet Take 1 tablet by mouth daily.   nystatin 100000 UNIT/ML suspension Commonly known as: MYCOSTATIN TAKE 1 TEASPOONFUL BY MOUTH 3 TIMES DAILY AS NEEDED FOR MOUTH SORES. What changed:   how much  to take  how to take this  when to take this  reasons to take this  additional instructions   pantoprazole 40 MG tablet Commonly known as: PROTONIX TAKE 1 TABLET BY MOUTH EVERY DAY 30 TO 60 MINUTES BEFORE THE FIRST MEAL OF THE DAY What changed:   how much to take  how to take this  when to take this  additional instructions   promethazine-dextromethorphan 6.25-15 MG/5ML syrup Commonly known as: PROMETHAZINE-DM Take 5 mLs by mouth 4 (four) times daily as needed for cough.        Discharge Assessment: Vitals:   02/14/20 0355 02/14/20 0756  BP: (!) 116/59 (!) 117/59  Pulse: 68 67  Resp: 14 17  Temp:  98 F (36.7 C)  SpO2: 99% 99%   Skin clean, dry and intact without evidence of skin break down, no evidence of skin tears noted. IV catheter discontinued intact. Site without signs and symptoms of complications - no redness or edema noted at insertion site, patient denies c/o pain - only slight tenderness at site.  Dressing with slight pressure applied.  D/c Instructions-Education: Discharge instructions given to patient/family with verbalized understanding. D/c education completed with patient/family including follow up instructions, medication list, d/c activities limitations if indicated, with other d/c instructions as indicated by MD - patient able to verbalize understanding, all questions fully answered. Patient instructed to return to ED, call 911, or call MD for any changes in condition.  Patient escorted via WC, and D/C home via private auto.  Luellen Pucker, RN 02/14/2020 2:03 PM

## 2020-02-14 NOTE — Plan of Care (Signed)
  Problem: Education: Goal: Knowledge of General Education information will improve Description: Including pain rating scale, medication(s)/side effects and non-pharmacologic comfort measures Outcome: Adequate for Discharge   Problem: Health Behavior/Discharge Planning: Goal: Ability to manage health-related needs will improve Outcome: Adequate for Discharge   Problem: Activity: Goal: Risk for activity intolerance will decrease Outcome: Adequate for Discharge  Patient ambulated in hall with standby assist. Pt verbalizes understanding of condition, treatment provided, discharge plan, and discharge medications.

## 2020-02-14 NOTE — Progress Notes (Signed)
Patient given discharge instructions and stated understanding.  Patient's daughter is picking her up and bringing [employer] for her ride home.

## 2020-02-14 NOTE — Discharge Summary (Signed)
[facility]  Physician Discharge Summary   Patient ID: Brenda [name] MRN: [id number] DOB/AGE: [birth year] 59 y.o.  Admit date: 02/07/2020 Discharge date: 02/14/2020  PCP: [name], MD  DISCHARGE DIAGNOSES:  TIA Diabetes mellitus type 2, uncontrolled with peripheral neuropathy Chronic respiratory failure with hypoxia on home oxygen History of COPD History of OSA CKD stage IIIa   RECOMMENDATIONS FOR OUTPATIENT FOLLOW UP: 1. Follow-up with neurology in 3 months    Home Health: Home health has been ordered Equipment/Devices: None  CODE STATUS: Full code  DISCHARGE CONDITION: fair  Diet recommendation: Modified carbohydrate heart healthy  INITIAL HISTORY: 59 year old lady with prior history of COPD, chronic hypoxic respiratory failure on 3 L of nasal cannula oxygen at home, chronic chronic combined CHF, obstructive sleep apnea on CPAP, type 2 diabetes mellitus, stage IIIa CKD, recent admission for NSTEMI, presents to ED with left-sided weakness, slurred speech and nausea and vomiting.  Initial CT of the head without contrast negative for acute hemorrhage or infarction.  CT angiogram of the head and neck showed basilar artery stenosis.  Neurology was consulted, IR was consulted and underwent cerebral angiogram  showing high grade stenosis of the basilar artery. She will probably need stent placement. She underwent  Cerebral angiogram this am, revealing a patent distal basilar artery and left PCA P1 with a small post wall filling defect of distal basilar artery, hence no intervention was performed. IR recommended follow up with CTA of the head and neck with contrast in 3 months post discharge. Meanwhile Dr Pearlean Brownie with Neurology recommended getting a TEE and loop placement . Initially patient wanted the TEE to be done inpatient, rather than outpatient. Cardiology requested for TEE, which will be scheduled on Friday. Later this evening patient changed her mind and wanted to go  home tomorrow and get TEE and Loop placement as outpatient. She said she couldn't leave today as her family left for the day.   Consultations:  Neurology  Interventional radiology  Procedures:  Cerebral angiogram   HOSPITAL COURSE:   TIA CT head with contrast does not  show any acute finding.  CT angiogram of the head and neck Focal high-grade stenosis of the basilar artery at the superior cerebellar artery origins.  IR consulted and she was scheduled for cerebral angiogram and possible stenting of the basilar artery.  Cerebral angiogram showed Patent distal basilar artery and Lt PCA P1 with a small post wall filling defect of distal basilar artery.  Hence no intervention was done.  IR recommended CTA of the head neck with contrast in 3 months.  Dr Pearlean Brownie with Neurology recommended TEE and loop placement which are to be scheduled as outpatient.  Aspirin was continued.  Patient was started on Brilinta due to possibility of stent into the basilar artery.  However now that no stent was placed further plan was discussed with neurology.  Okay to transition to Plavix.  Patient did take aspirin and Plavix for 3 months and then follow-up with neurology for further management.  Discussed with Dr. Pearlean Brownie.  History of chronic combined systolic and diastolic heart failure Patient appears to be compensated and is on 3 L of nasal cannula oxygen.  Echocardiogram reviewed showed left ventricular EF of 40 to 45%.   Obstructive sleep apnea CPAP at night  Chronic respiratory failure with hypoxia on 3 L of nasal cannula oxygen Probably secondary to obstructive sleep apnea, COPD,, chronic, and heart failure.  CKD Stage IIIa  Creatinine appears to be at baseline at  this time.  Leukocytosis Improved to 16,000/ patient remains afebrile.  Covid screening test is negative.  Urine analysis and chest x-ray are negative for infection. Probably reactive.  If her wbc count stays persistently high , will need  follow up with hematology for further further evaluation.   Type 2 DM: well controlled Hemoglobin A1c is 6.1%.   COPD Stable   Anxiety Resume ativan.   Obesity Estimated body mass index is 39.95 kg/m as calculated from the following:   Height as of this encounter:  (1.549 m).   Weight as of this encounter: 95.9 kg.  Overall stable.  Okay for discharge home today.  PERTINENT LABS:  The results of significant diagnostics from this hospitalization (including imaging, microbiology, ancillary and laboratory) are listed below for reference.    Microbiology: Recent Results (from the past 240 hour(s))  SARS Coronavirus 2 by RT PCR (hospital order, performed in [facility] hospital lab) Nasopharyngeal Nasopharyngeal Swab     Status: None   Collection Time: 02/07/20  1:35 AM   Specimen: Nasopharyngeal Swab  Result Value Ref Range Status   SARS Coronavirus 2 NEGATIVE NEGATIVE Final    Comment: (NOTE) SARS-CoV-2 target nucleic acids are NOT DETECTED.  The SARS-CoV-2 RNA is generally detectable in upper and lower respiratory specimens during the acute phase of infection. The lowest concentration of SARS-CoV-2 viral copies this assay can detect is 250 copies / mL. A negative result does not preclude SARS-CoV-2 infection and should not be used as the sole basis for treatment or other patient management decisions.  A negative result may occur with improper specimen collection / handling, submission of specimen other than nasopharyngeal swab, presence of viral mutation(s) within the areas targeted by this assay, and inadequate number of viral copies (<250 copies / mL). A negative result must be combined with clinical observations, patient history, and epidemiological information.  Fact Sheet for Patients:   BoilerBrush.com.cy  Fact Sheet for Healthcare Providers: https://pope.com/  This test is not yet approved or  cleared by  the [place] FDA and has been authorized for detection and/or diagnosis of SARS-CoV-2 by FDA under an Emergency Use Authorization (EUA).  This EUA will remain in effect (meaning this test can be used) for the duration of the COVID-19 declaration under Section 564(b)(1) of the Act, 21 U.S.C. section 360bbb-3(b)(1), unless the authorization is terminated or revoked sooner.  Performed at [facility], [address], [place], [place] [address]   MRSA PCR Screening     Status: None   Collection Time: 02/12/20 10:28 PM   Specimen: Nasal Mucosa; Nasopharyngeal  Result Value Ref Range Status   MRSA by PCR NEGATIVE NEGATIVE Final    Comment:        The GeneXpert MRSA Assay (FDA approved for NASAL specimens only), is one component of a comprehensive MRSA colonization surveillance program. It is not intended to diagnose MRSA infection nor to guide or monitor treatment for MRSA infections. Performed at [facility], [address], [place], [place] [address]      Labs:    Basic Metabolic Panel: Recent Labs  Lab 02/07/20 2053 02/07/20 2055 02/10/20 0352 02/13/20 1502  NA 138 137 138 138  K 4.4 4.2 4.1 4.2  CL 98 100 106 105  CO2 28  --  26 26  GLUCOSE 138* 139* 109* 96  BUN 21 24* 16 11  CREATININE 1.38* 1.50* 0.89 0.88  CALCIUM 9.5  --  9.1 9.3  Liver Function Tests: Recent Labs  Lab 02/07/20 2053  AST 23  ALT 126*  ALKPHOS 88  BILITOT 0.5  PROT 7.0  ALBUMIN 3.5   CBC: Recent Labs  Lab 02/07/20 2053 02/07/20 2055 02/10/20 0352 02/13/20 1502  WBC 18.0*  --  16.1* 16.0*  NEUTROABS 10.9*  --   --   --   HGB 14.4 15.3* 12.1 13.1  HCT 44.6 45.0 38.1 39.8  MCV 92.9  --  93.6 92.6  PLT 277  --  238 254   BNP: BNP (last 3 results) Recent Labs    01/30/20 0955  BNP 47.7    CBG: Recent Labs  Lab 02/13/20 0627 02/13/20 0816 02/13/20 1133 02/13/20 2108 02/14/20 0627  GLUCAP 132* 120* 119* 114* 108*     IMAGING STUDIES CT Angio  Head W or Wo Contrast  Result Date: 02/07/2020 CLINICAL DATA:  Left-sided weakness, code stroke follow-up EXAM: CT ANGIOGRAPHY HEAD AND NECK TECHNIQUE: Multidetector CT imaging of the head and neck was performed using the standard protocol during bolus administration of intravenous contrast. Multiplanar CT image reconstructions and MIPs were obtained to evaluate the vascular anatomy. Carotid stenosis measurements (when applicable) are obtained utilizing NASCET criteria, using the distal internal carotid diameter as the denominator. CONTRAST:  80mL OMNIPAQUE IOHEXOL 350 MG/ML SOLN COMPARISON:  None. FINDINGS: CTA NECK Aortic arch: Great vessel origins are patent with mild calcified plaque. Right carotid system: Patent. Patent mixed but primarily noncalcified plaque at the ICA origin causing less than 50% stenosis. Left carotid system: Patent. Mild calcified plaque at the ICA origin causing less than 50% stenosis. Vertebral arteries: Patent. Right vertebral artery is dominant. Left vertebral artery origin is not well seen due to artifact. Skeleton: Multilevel degenerative changes of the cervical spine. Other neck: No mass or adenopathy. Upper chest: No apical lung mass. Review of the MIP images confirms the above findings CTA HEAD Anterior circulation: Intracranial internal carotid arteries are patent. Posterior circulation: Intracranial right vertebral artery is patent. Patent right PICA origin. Intracranial left vertebral artery becomes diminutive with high-grade stenosis or occlusion distally. Basilar artery is patent. Small left AICA appears to be present. There is high-grade stenosis near the superior cerebellar origins. The superior cerebellar artery origins are poorly opacified with reconstitution. Bilateral posterior communicating arteries are present. There is fetal origin of the right PCA. Venous sinuses: Patent as allowed by contrast bolus timing. Review of the MIP images confirms the above findings  IMPRESSION: No large vessel occlusion. Plaque at the ICA origins causes less than 50% stenosis. Small caliber nondominant left vertebral artery becomes diminutive intracranially with high-grade stenosis or occlusion distally. A left PICA origin is not identified and may be congenitally absent. A small left AICA appears to be present. Focal high-grade stenosis of the basilar artery at the superior cerebellar artery origins. These results were called by telephone at the time of interpretation on 02/07/2020 at 9:39 pm to provider Dr. Iver Nestle, who verbally acknowledged these results. Electronically Signed   By: Guadlupe Spanish M.D.   On: 02/07/2020 21:41   CT Angio Neck W and/or Wo Contrast  Result Date: 02/07/2020 CLINICAL DATA:  Left-sided weakness, code stroke follow-up EXAM: CT ANGIOGRAPHY HEAD AND NECK TECHNIQUE: Multidetector CT imaging of the head and neck was performed using the standard protocol during bolus administration of intravenous contrast. Multiplanar CT image reconstructions and MIPs were obtained to evaluate the vascular anatomy. Carotid stenosis measurements (when applicable) are obtained utilizing NASCET criteria, using the distal internal carotid  diameter as the denominator. CONTRAST:  80mL OMNIPAQUE IOHEXOL 350 MG/ML SOLN COMPARISON:  None. FINDINGS: CTA NECK Aortic arch: Great vessel origins are patent with mild calcified plaque. Right carotid system: Patent. Patent mixed but primarily noncalcified plaque at the ICA origin causing less than 50% stenosis. Left carotid system: Patent. Mild calcified plaque at the ICA origin causing less than 50% stenosis. Vertebral arteries: Patent. Right vertebral artery is dominant. Left vertebral artery origin is not well seen due to artifact. Skeleton: Multilevel degenerative changes of the cervical spine. Other neck: No mass or adenopathy. Upper chest: No apical lung mass. Review of the MIP images confirms the above findings CTA HEAD Anterior circulation:  Intracranial internal carotid arteries are patent. Posterior circulation: Intracranial right vertebral artery is patent. Patent right PICA origin. Intracranial left vertebral artery becomes diminutive with high-grade stenosis or occlusion distally. Basilar artery is patent. Small left AICA appears to be present. There is high-grade stenosis near the superior cerebellar origins. The superior cerebellar artery origins are poorly opacified with reconstitution. Bilateral posterior communicating arteries are present. There is fetal origin of the right PCA. Venous sinuses: Patent as allowed by contrast bolus timing. Review of the MIP images confirms the above findings IMPRESSION: No large vessel occlusion. Plaque at the ICA origins causes less than 50% stenosis. Small caliber nondominant left vertebral artery becomes diminutive intracranially with high-grade stenosis or occlusion distally. A left PICA origin is not identified and may be congenitally absent. A small left AICA appears to be present. Focal high-grade stenosis of the basilar artery at the superior cerebellar artery origins. These results were called by telephone at the time of interpretation on 02/07/2020 at 9:39 pm to provider Dr. Iver Nestle, who verbally acknowledged these results. Electronically Signed   By: Guadlupe Spanish M.D.   On: 02/07/2020 21:41   CT CHEST WO CONTRAST  Result Date: 01/31/2020 CLINICAL DATA:  Respiratory distress, COPD exacerbation EXAM: CT CHEST WITHOUT CONTRAST TECHNIQUE: Multidetector CT imaging of the chest was performed following the standard protocol without IV contrast. COMPARISON:  Radiograph 01/30/2020, CT 09/07/2017 FINDINGS: Cardiovascular: Normal cardiac size. No pericardial effusion. Few scattered coronary artery calcifications are present. Atherosclerotic calcifications present in the normal caliber thoracic aorta and proximal great vessels which branch normally from the aortic arch. No periaortic stranding or hemorrhage.  Central pulmonary arteries are normal caliber. Luminal evaluation precluded in the absence of contrast media. No major venous abnormalities are seen. Mediastinum/Nodes: No mediastinal fluid or gas. Normal thyroid gland and thoracic inlet. No acute abnormality of the trachea or esophagus. No worrisome mediastinal or axillary adenopathy.Few prominent paratracheal nodes are present but not significantly changed from prior including a 9 mm lymph node with preserved fatty hilum (3/20). Hilar nodal evaluation is limited in the absence of intravenous contrast media. Lungs/Pleura: There is diffuse mild airways thickening. Mixed ground-glass, consolidation and tree-in-bud nodularity is present predominantly in the right lower lobe and posterior segment right upper lobe to a lesser extent. Some mild interlobular septal and fissural thickening is noted as well with trace bilateral effusions. Central pulmonary vascular congestion is present with redistributed pulmonary vascularity. Additional bandlike areas of opacity may reflect atelectatic change or scarring. No pneumothorax. Few scattered calcified granulomata. No concerning pulmonary nodules or masses within the limitations of parenchymal disease though follow-up to resolution should be considered. Upper Abdomen: Patient appears to be post cholecystectomy. Interposition of the hepatic flexure anterior to the liver. Stable lobular thickening of the left adrenal gland. Musculoskeletal: Multilevel degenerative changes are present  in the imaged portions of the spine. Some sclerotic likely Modic type endplate changes noted throughout the mid to lower thoracic levels. No acute or concerning osseous lesions. No worrisome chest wall masses or lesions. Mild body wall edema is noted. IMPRESSION: 1. Mixed ground-glass, consolidation and tree-in-bud nodularity predominantly in the right lower lobe and to a lesser extent the posterior segment right upper lobe. Findings could reflect an  acute multifocal infection or inflammatory process or sequela of aspiration. 2. Additional superimposed features of septal thickening and vascular redistribution/congestion with trace effusions may suggest some superimposed edematous change as well. 3. Mild body wall edema. 4. Aortic Atherosclerosis (ICD10-I70.0). 5. Coronary artery calcifications are present. Please note that the presence of coronary artery calcium documents the presence of coronary artery disease, the severity of this disease and any potential stenosis cannot be assessed on this non-gated CT examination. Electronically Signed   By: Kreg Shropshire M.D.   On: 01/31/2020 23:56   MR BRAIN WO CONTRAST  Result Date: 02/08/2020 CLINICAL DATA:  Initial evaluation for acute double vision, left-sided weakness, speech difficulty. EXAM: MRI HEAD WITHOUT CONTRAST TECHNIQUE: Multiplanar, multiecho pulse sequences of the brain and surrounding structures were obtained without intravenous contrast. COMPARISON:  Prior CTs from 02/07/2020. FINDINGS: Brain: Examination degraded by motion artifact. Cerebral volume within normal limits for age. Few scattered foci of T2/FLAIR hyperintensity noted involving the supratentorial cerebral white matter, nonspecific, but felt to be within normal limits for age and of doubtful significance. No abnormal foci of restricted diffusion to suggest acute or subacute ischemia. Gray-white matter differentiation maintained. No encephalomalacia to suggest chronic cortical infarction. No foci of susceptibility artifact to suggest acute or chronic intracranial hemorrhage. No mass lesion, midline shift or mass effect. No hydrocephalus or extra-axial fluid collection. Incidental note made of an empty sella. Midline structures intact. Vascular: Major intracranial vascular flow voids are maintained. Skull and upper cervical spine: Craniocervical junction within normal limits. No visible focal marrow replacing lesion. No scalp soft tissue  abnormality. Sinuses/Orbits: Globes and orbital soft tissues within normal limits. Mild scattered mucosal thickening noted within the ethmoidal air cells. Paranasal sinuses are otherwise clear. Small right greater than left mastoid effusions, of doubtful significance. Inner ear structures grossly normal. Visualized nasopharynx within normal limits. Other: None. IMPRESSION: 1. No acute intracranial abnormality. 2. Empty sella. While this finding is often incidental in nature and of no clinical significance, this can also be seen in the setting of idiopathic intracranial hypertension. 3. Otherwise unremarkable and normal brain MRI for age. Electronically Signed   By: Rise Mu M.D.   On: 02/08/2020 02:39   IR US Guide Vasc Access Right  Result Date: 02/12/2020 CLINICAL DATA:  History of vertebrobasilar ischemia with transient diplopia, vertigo, dizziness, and gait imbalance. 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 EXAM: BILATERAL COMMON CAROTID AND INNOMINATE ANGIOGRAPHY COMPARISON:  CT angiogram of the head and neck of February 07, 2020. MEDICATIONS: Heparin 2000 units IA. No antibiotic was administered within 1 hour of the procedure. ANESTHESIA/SEDATION: Versed 1 mg IV; Fentanyl 25 mcg IV Moderate Sedation Time:  52 minutes The patient was continuously monitored during the procedure by the interventional radiology nurse under my direct supervision. CONTRAST:  Isovue 300 approximately 65 mL FLUOROSCOPY TIME:  Fluoroscopy Time: 16 minutes 36 seconds (1319 mGy). COMPLICATIONS: None immediate. TECHNIQUE: Informed written consent was obtained from the patient after a thorough discussion of the procedural risks, benefits and alternatives. All questions were  addressed. Maximal Sterile Barrier Technique was utilized including caps, mask, sterile gowns, sterile gloves, sterile drape, hand hygiene and skin antiseptic. A timeout was performed prior to the initiation  of the procedure. The right the right forearm to the wrist was prepped and draped in the usual sterile manner. The right radial artery was then identified with ultrasound, and its morphology documented. A dorsal palmar anastomosis was verified to be present. Using ultrasound guidance access into the right radial artery was obtained with a micropuncture set over a 0.018 inch micro guidewire. Over the micro guidewire, a 4/5 French radial sheath was then inserted. The micro guidewire, and the obturator were removed. Good aspiration was obtained from the side port of the sheath. A cocktail of 2000 units of heparin, 2.5 mg of verapamil, and 200 mcg of nitroglycerin was infused in diluted form through the radial sheath without event. A right radial arteriogram was then obtained. Over a 0.035 inch Roadrunner guidewire, a 5 [place] Simmons 2 diagnostic catheter was advanced to the aortic arch region and selectively positioned in the right vertebral artery, the left subclavian artery, the left common carotid artery and the right common carotid artery. Following the procedure, hemostasis at the right radial puncture site was obtained with a wrist band. Distal right radial pulse was verified to be present. FINDINGS: The right vertebral artery origin is widely patent. The vessel is seen to opacify to the cranial skull base. Patency is maintained of the right posteroinferior cerebellar artery and the right vertebrobasilar junction. The basilar artery to the level of the superior cerebellar artery demonstrates wide patency. Distal to this there is a severe focal stenosis. Opacification is seen above the superior cerebellar arteries and the anterior-inferior cerebellar arteries into the capillary and venous phases. Proximal opacification of the left posterior cerebral artery is noted. Unopacified blood is seen in the basilar artery from the contralateral non dominant vertebral artery. The non dominant vertebral artery origin is  widely patent. The vessel opacifies to the cranial skull base. Diminutive caliber of the left vertebrobasilar junction is seen distal to the left posterior-inferior cerebellar artery. The right common carotid arteriogram demonstrates the right external carotid artery and its major branches to be widely patent. The right internal carotid artery at the bulb to the cranial skull base is widely patent with moderate tortuosity in the mid 1/3. The petrous, cavernous and supraclinoid segments are widely patent. The right posterior communicating artery is seen opacifying the right posterior cerebral artery distribution. The right middle cerebral artery and the right anterior cerebral artery opacify into the capillary and venous phases. Moderate narrowing is seen of the superior division proximally. The left common carotid arteriogram demonstrates the left external carotid artery origin and its branches to be widely patent. The left internal carotid artery at the bulb has a smooth shallow plaque without evidence of ulcerations or of intraluminal filling defects. The vessel is seen to opacify to the cranial skull base. Patency is seen of the petrous, cavernous and the supraclinoid left ICA. A left posterior communicating artery is seen opacifying the left posterior cerebral artery distribution. The left middle cerebral artery and the left anterior cerebral artery opacify into the capillary and venous phases. 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 PLAN: Findings reviewed with the patient and the referring stroke neurologist. The option of endovascular revascularization of symptomatic high-grade basilar artery stenosis was reviewed in some detail. Procedure, reasons, and potential complications  were reviewed with the patient. Patient has expressed her desire to pursue endovascular treatment with angioplasty +/-stenting. This will be  scheduled as soon as possible. Electronically Signed   By: Julieanne Cotton M.D.   On: 02/09/2020 15:29   DG CHEST PORT 1 VIEW  Result Date: 02/08/2020 CLINICAL DATA:  Code stroke patient. Double vision, left-sided weakness and difficulty getting words out. History of asthma, hypertension, bronchitis, COPD. EXAM: PORTABLE CHEST 1 VIEW COMPARISON:  01/30/2020 FINDINGS: Shallow inspiration. Heart size and pulmonary vascularity are normal. Mild infiltrates in the lung bases with improvement since previous study. No pleural effusions. No pneumothorax. Mediastinal contours appear intact. IMPRESSION: Mild infiltrates in the lung bases with improvement since previous study. Electronically Signed   By: Burman Nieves M.D.   On: 02/08/2020 00:31   DG Chest Portable 1 View  Result Date: 01/30/2020 CLINICAL DATA:  Shortness of breath.  Potential COVID. EXAM: PORTABLE CHEST 1 VIEW COMPARISON:  05/30/2019. FINDINGS: Borderline cardiomegaly. No pulmonary venous congestion. Diffuse prominent bilateral interstitial prominence itis. No pleural effusion or pneumothorax. IMPRESSION: 1. Diffuse prominent bilateral interstitial prominence consistent with pneumonitis. 2.  Borderline cardiomegaly.  No pulmonary venous congestion. Electronically Signed   By: Maisie Fus  Register   On: 01/30/2020 11:09   DG Swallowing Func-Speech Pathology  Result Date: 02/02/2020 Objective Swallowing Evaluation: Type of Study: MBS-Modified Barium Swallow Study  Patient Details Name: MERLINE PERKIN MRN: [id number] Date of Birth: 1961-04-11 Today's Date: 02/02/2020 Time: SLP Start Time (ACUTE ONLY): 0949 -SLP Stop Time (ACUTE ONLY): 1003 SLP Time Calculation (min) (ACUTE ONLY): 14 min Past Medical History: Past Medical History: Diagnosis Date . Acute pancreatitis  . Asthma   main dyspnea thought due to deconditioning (10/2013) . Bipolar I disorder, most recent episode (or current) manic, unspecified   pt denies this . Bronchitis, chronic obstructive  (HCC) 2008 & 2009  FeV1 64% TLC 105% DLCO 55% 2008  -  FeV1 81% FeF 25-75 43% 2009 . COPD (chronic obstructive pulmonary disease) (HCC)   emphysema, not an issue per pulm (10/2013) . History of pyelonephritis 05/2012 . HTN (hypertension)  . Hx of migraines  . Knee pain, right  . Leukocytosis, unspecified  . Lumbar disc disease with radiculopathy   multilevel spondylitic changes, scoliosis, and anterolisthesis L4 not thought to currently be surg candidate (Dr. Phoebe Perch, [facility]) (Dr. Ollen Bowl, [place]) . Nicotine addiction  . Nonspecific abnormal electrocardiogram (ECG) (EKG)  . Obesity  . Pure hyperglyceridemia  . Suicide attempt (HCC) 06/2001 . Swelling of limb  . Urge and stress incontinence 07/2012  ([facility]) Past Surgical History: Past Surgical History: Procedure Laterality Date . CHOLECYSTECTOMY  07/1982 . CT MAXILLOFACIAL WO/W CM  06/14/06  Left max mucocele . CT of chest  06/14/2006  Normal except c/w active inflammation / infection.  Left renal atrophy . ERCP / sphincterotomy - stenosis  01/15/06 . lumbar MRI  11/2010  multilevel spondylitic changes with upper lumbar scoliosis and anterolisthesis of L4 on 5 with some L foraminal narrowing esp at L/3 with concavity of scoliosis, some central stenosis and biforaminal narrowing at L4/5 with spondylolisthesis . [place] - Pneumonia, acute asthma, left maxillary sinusitis  01/11 - 06/18/2006 . Retained stone    ERCP secondary to retained stone . Right knee surgery  1984,1989,1989,1991,1994  Reconstructions for ACL insuff . TUBAL LIGATION   HPI: Pt is a 59 y.o. female with medical history significant of chronic hypoxic respiratory failure on 2 to 3 L baseline O2 and CPAP at bedtime, COPD Gold  stage III (FEV1 44%, improved with bronchodilator to 50%, 2019), chronic cigarette smoker 5-10 cigarettes a day hypertension, OSA on CPAP at bedtime, IIDM, diabetic neuropathy, chronic pain, depression/anxiety, morbid obesity who presented to the ED with increasing short of  breath. Rapid response on 9/1 due to respiratory distress and was placed on non rebreather mask. CT chest 9/1: Mixed ground-glass, consolidation and tree-in-bud nodularity predominantly in the right lower lobe and to a lesser extent the posterior segment right upper lobe. Findings could reflect an acute multifocal infection or inflammatory process or sequela of aspiration. Pt reported "getting choked on food often" to Dr. Katrinka Blazing.  No data recorded Assessment / Plan / Recommendation CHL IP CLINICAL IMPRESSIONS 02/02/2020 Clinical Impression Pt's oropharyngeal swallow mechanism was within functional limits with transient penetration (PAS 2) of thin liquids when consecutive swallows were used. Esophageal screening revealed backflow of material from the lower to upper thoracic esophagus and mild esophageal stasis. Backflow to the pharynx was not observed while the fluoro was on. However, pt demonstrated subsequent coughing and the possibility of aspiration secondary to backflowed material reaching the cervical esophagus and pharynx is suspected. Considering the results of the esophageal screening combined with her reported symptoms, assessment (e.g., esophagram) of the esophageal phase of her swallow is recommended. Pt was educated regarding results and recommendations as well as strategies to reduce aspiration risk during episodes of dyspnea. She verbalized understanding. Further skilled SLP services are not clinically indicated at this time.  SLP Visit Diagnosis Dysphagia, unspecified (R13.10) Attention and concentration deficit following -- Frontal lobe and executive function deficit following -- Impact on safety and function Mild aspiration risk   CHL IP TREATMENT RECOMMENDATION 02/02/2020 Treatment Recommendations No treatment recommended at this time   Prognosis 02/02/2020 Prognosis for Safe Diet Advancement Good Barriers to Reach Goals -- Barriers/Prognosis Comment -- CHL IP DIET RECOMMENDATION 02/02/2020 SLP Diet  Recommendations Regular solids;Thin liquid Liquid Administration via Cup;Straw Medication Administration Whole meds with liquid Compensations Slow rate;Small sips/bites Postural Changes --   No flowsheet data found.  CHL IP FOLLOW UP RECOMMENDATIONS 02/02/2020 Follow up Recommendations None   CHL IP FREQUENCY AND DURATION 02/02/2020 Speech Therapy Frequency (ACUTE ONLY) min 2x/week Treatment Duration 1 week      CHL IP ORAL PHASE 02/02/2020 Oral Phase WFL Oral - Pudding Teaspoon -- Oral - Pudding Cup -- Oral - Honey Teaspoon -- Oral - Honey Cup -- Oral - Nectar Teaspoon -- Oral - Nectar Cup -- Oral - Nectar Straw -- Oral - Thin Teaspoon -- Oral - Thin Cup -- Oral - Thin Straw -- Oral - Puree -- Oral - Mech Soft -- Oral - Regular -- Oral - Multi-Consistency -- Oral - Pill -- Oral Phase - Comment --  CHL IP PHARYNGEAL PHASE 02/02/2020 Pharyngeal Phase WFL Pharyngeal- Pudding Teaspoon -- Pharyngeal -- Pharyngeal- Pudding Cup -- Pharyngeal -- Pharyngeal- Honey Teaspoon -- Pharyngeal -- Pharyngeal- Honey Cup -- Pharyngeal -- Pharyngeal- Nectar Teaspoon -- Pharyngeal -- Pharyngeal- Nectar Cup -- Pharyngeal -- Pharyngeal- Nectar Straw -- Pharyngeal -- Pharyngeal- Thin Teaspoon -- Pharyngeal -- Pharyngeal- Thin Cup -- Pharyngeal -- Pharyngeal- Thin Straw Penetration/Aspiration during swallow Pharyngeal Material enters airway, remains ABOVE vocal cords then ejected out Pharyngeal- Puree -- Pharyngeal -- Pharyngeal- Mechanical Soft -- Pharyngeal -- Pharyngeal- Regular -- Pharyngeal -- Pharyngeal- Multi-consistency -- Pharyngeal -- Pharyngeal- Pill -- Pharyngeal -- Pharyngeal Comment --  CHL IP CERVICAL ESOPHAGEAL PHASE 02/02/2020 Cervical Esophageal Phase (No Data) Pudding Teaspoon -- Pudding Cup -- Honey Teaspoon -- Honey Cup --  Nectar Teaspoon -- Nectar Cup -- Nectar Straw -- Thin Teaspoon -- Thin Cup -- Thin Straw -- Puree -- Mechanical Soft -- Regular -- Multi-consistency -- Pill -- Cervical Esophageal Comment -- Shanika I.  Vear Clock, MS, CCC-SLP Acute Rehabilitation Services Office number [phone] Pager [phone] Scheryl Marten 02/02/2020, 10:28 AM              ECHOCARDIOGRAM COMPLETE  Result Date: 01/31/2020    ECHOCARDIOGRAM REPORT   Patient Name:   ADI SEALES Date of Exam: 01/31/2020 Medical Rec #:  [id number]     Height:       61.0 in Accession #:    [id number]    Weight:       216.0 lb Date of Birth:  02/19/1961     BSA:          1.952 m Patient Age:    59 years      BP:           102/66 mmHg Patient Gender: F             HR:           71 bpm. Exam Location:  Inpatient Procedure: 2D Echo Indications:    dyspnea  History:        Patient has no prior history of Echocardiogram examinations.                 COPD; Risk Factors:Hypertension and Current Smoker.  Sonographer:    Celene Skeen RDCS (AE) Referring Phys: [id number] [facility] General  Sonographer Comments: Image acquisition challenging due to patient body habitus, Image acquisition challenging due to respiratory motion and Image acquisition challenging due to COPD. IMPRESSIONS  1. Technically difficult study, even with Definity contrast. There is no left ventricular thrombus. There is relatively preserved systolic function in the basal segments. Findings suggest stress cardiomyopathy (takotsubo sd.), but could well represent multivessel CAD. Left ventricular ejection fraction, by estimation, is 30 to 35%. The left ventricle has moderate to severely decreased function. The left ventricle demonstrates global hypokinesis. The left ventricular internal cavity size was mildly dilated. Left ventricular diastolic parameters are consistent with Grade II diastolic dysfunction (pseudonormalization). Elevated left atrial pressure.  2. Right ventricular systolic function was not well visualized. The right ventricular size is normal. Tricuspid regurgitation signal is inadequate for assessing PA pressure.  3. The mitral valve is grossly normal. No evidence of mitral valve  regurgitation. No evidence of mitral stenosis.  4. The aortic valve is grossly normal. Aortic valve regurgitation is not visualized. No aortic stenosis is present. Comparison(s): Prior images reviewed side by side. The left ventricular function is significantly worse. The left ventricular wall motion abnormalities are new. Consider serial studies, with Definity. FINDINGS  Left Ventricle: Technically difficult study, even with Definity contrast. There is no left ventricular thrombus. There is relatively preserved systolic function in the basal segments. Findings suggest stress cardiomyopathy (takotsubo sd.), but could well represent multivessel CAD. Left ventricular ejection fraction, by estimation, is 30 to 35%. The left ventricle has moderate to severely decreased function. The left ventricle demonstrates global hypokinesis. Definity contrast agent was given IV to delineate the left ventricular endocardial borders. The left ventricular internal cavity size was mildly dilated. There is no left ventricular hypertrophy. Left ventricular diastolic parameters are consistent with Grade II diastolic dysfunction (pseudonormalization). Elevated left atrial pressure. Right Ventricle: The right ventricular size is normal. No increase in right ventricular wall thickness. Right ventricular systolic function was not well  visualized. Tricuspid regurgitation signal is inadequate for assessing PA pressure. Left Atrium: Left atrial size was normal in size. Right Atrium: Right atrial size was normal in size. Pericardium: There is no evidence of pericardial effusion. Mitral Valve: The mitral valve is grossly normal. No evidence of mitral valve regurgitation. No evidence of mitral valve stenosis. Tricuspid Valve: The tricuspid valve is normal in structure. Tricuspid valve regurgitation is not demonstrated. Aortic Valve: The aortic valve is grossly normal. Aortic valve regurgitation is not visualized. No aortic stenosis is present.  Pulmonic Valve: The pulmonic valve was not well visualized. Pulmonic valve regurgitation is not visualized. Aorta: The aortic root is normal in size and structure. IAS/Shunts: No atrial level shunt detected by color flow Doppler.  LEFT VENTRICLE PLAX 2D LVIDd:         5.50 cm  Diastology LVIDs:         4.20 cm  LV e' lateral:   7.51 cm/s LV PW:         0.90 cm  LV E/e' lateral: 11.5 LV IVS:        1.10 cm LVOT diam:     2.40 cm LV SV:         80 LV SV Index:   41 LVOT Area:     4.52 cm  LEFT ATRIUM           Index LA diam:      3.70 cm 1.90 cm/m LA Vol (A4C): 47.7 ml 24.44 ml/m  AORTIC VALVE LVOT Vmax:   96.60 cm/s LVOT Vmean:  70.600 cm/s LVOT VTI:    0.176 m  AORTA Ao Root diam: 2.70 cm MITRAL VALVE MV Area (PHT): 3.54 cm    SHUNTS MV Decel Time: 214 msec    Systemic VTI:  0.18 m MV E velocity: 86.50 cm/s  Systemic Diam: 2.40 cm MV A velocity: 72.00 cm/s MV E/A ratio:  1.20 Mihai Croitoru MD Electronically signed by Thurmon Fair MD Signature Date/Time: 01/31/2020/4:22:37 PM    Final    ECHOCARDIOGRAM LIMITED BUBBLE STUDY  Result Date: 02/08/2020    ECHOCARDIOGRAM REPORT   Patient Name:   JEMIMA PETKO Date of Exam: 02/08/2020 Medical Rec #:  [id number]     Height:       61.5 in Accession #:    [id number]    Weight:       221.5 lb Date of Birth:  1961/03/15     BSA:          1.985 m Patient Age:    59 years      BP:           111/71 mmHg Patient Gender: F             HR:           68 bpm. Exam Location:  Inpatient Procedure: Limited Echo, Cardiac Doppler, Color Doppler, Saline Contrast Bubble            Study and Intracardiac Opacification Agent Indications:    TIA  History:        Patient has prior history of Echocardiogram examinations, most                 recent 01/31/2020. CHF, COPD; Risk Factors:Sleep Apnea and                 Diabetes. CKD.  Sonographer:    Ross Ludwig RDCS (AE) Referring Phys: [id number] TIMOTHY S OPYD  Sonographer Comments: Patient is morbidly obese.  Image acquisition challenging due to  patient body habitus and Image acquisition challenging due to COPD. IMPRESSIONS  1. Left ventricular ejection fraction, by estimation, is 40 to 45%. The left ventricle has mildly decreased function. The left ventricle demonstrates global hypokinesis. There is mild left ventricular hypertrophy. Left ventricular diastolic parameters are indeterminate.  2. Right ventricular systolic function was not well visualized. The right ventricular size is not well visualized.  3. The mitral valve is grossly normal. No evidence of mitral valve regurgitation. No evidence of mitral stenosis.  4. The aortic valve is grossly normal. Aortic valve regurgitation is not visualized. No aortic stenosis is present.  5. Aortic dilatation noted. There is mild dilatation of the ascending aorta, measuring 39 mm. Comparison(s): Changes from prior study are noted. LVEF somewhat improved. FINDINGS  Left Ventricle: Left ventricular ejection fraction, by estimation, is 40 to 45%. The left ventricle has mildly decreased function. The left ventricle demonstrates global hypokinesis. Definity contrast agent was given IV to delineate the left ventricular  endocardial borders. The left ventricular internal cavity size was normal in size. There is mild left ventricular hypertrophy. Left ventricular diastolic parameters are indeterminate. Right Ventricle: The right ventricular size is not well visualized. Right vetricular wall thickness was not assessed. Right ventricular systolic function was not well visualized. Left Atrium: Left atrial size was not well visualized. Right Atrium: Right atrial size was not well visualized. Pericardium: There is no evidence of pericardial effusion. Mitral Valve: The mitral valve is grossly normal. No evidence of mitral valve regurgitation. No evidence of mitral valve stenosis. Tricuspid Valve: The tricuspid valve is grossly normal. Tricuspid valve regurgitation is trivial. No evidence of tricuspid stenosis. Aortic Valve:  The aortic valve is grossly normal. Aortic valve regurgitation is not visualized. No aortic stenosis is present. Aortic valve mean gradient measures 5.0 mmHg. Aortic valve peak gradient measures 10.8 mmHg. Aortic valve area, by VTI measures  2.31 cm. Pulmonic Valve: The pulmonic valve was not well visualized. Pulmonic valve regurgitation is not visualized. Aorta: Aortic dilatation noted. There is mild dilatation of the ascending aorta, measuring 39 mm. IAS/Shunts: The interatrial septum was not well visualized. Agitated saline contrast was given intravenously to evaluate for intracardiac shunting.  LEFT VENTRICLE PLAX 2D LVIDd:         4.60 cm  Diastology LVIDs:         2.90 cm  LV e' medial:    5.33 cm/s LV PW:         1.30 cm  LV E/e' medial:  15.0 LV IVS:        1.20 cm  LV e' lateral:   4.57 cm/s LVOT diam:     2.20 cm  LV E/e' lateral: 17.5 LV SV:         62 LV SV Index:   31 LVOT Area:     3.80 cm  IVC IVC diam: 2.20 cm LEFT ATRIUM         Index LA diam:    3.40 cm 1.71 cm/m  AORTIC VALVE AV Area (Vmax):    2.19 cm AV Area (Vmean):   2.22 cm AV Area (VTI):     2.31 cm AV Vmax:           164.00 cm/s AV Vmean:          99.000 cm/s AV VTI:            0.267 m AV Peak Grad:      10.8 mmHg AV Mean  Grad:      5.0 mmHg LVOT Vmax:         94.60 cm/s LVOT Vmean:        57.900 cm/s LVOT VTI:          0.162 m LVOT/AV VTI ratio: 0.61  AORTA Ao Root diam: 3.10 cm Ao Asc diam:  3.90 cm MITRAL VALVE MV Area (PHT): 2.37 cm    SHUNTS MV Decel Time: 320 msec    Systemic VTI:  0.16 m MV E velocity: 80.10 cm/s  Systemic Diam: 2.20 cm MV A velocity: 69.40 cm/s MV E/A ratio:  1.15 Jodelle Red MD Electronically signed by Jodelle Red MD Signature Date/Time: 02/08/2020/10:46:37 PM    Final    CT HEAD CODE STROKE WO CONTRAST  Result Date: 02/07/2020 CLINICAL DATA:  Code stroke.  Left-sided weakness EXAM: CT HEAD WITHOUT CONTRAST TECHNIQUE: Contiguous axial images were obtained from the base of the skull  through the vertex without intravenous contrast. COMPARISON:  None. FINDINGS: Brain: There is no acute intracranial hemorrhage, mass effect, edema, or loss of gray-white differentiation. Ventricles and sulci are normal in size and configuration. There is no extra-axial collection. Vascular: No hyperdense vessel. Atherosclerotic calcification is present at the skull base. Skull: Unremarkable. Sinuses/Orbits: Patchy ethmoid opacification. Orbits are unremarkable. Other: Mastoid air cells are clear. ASPECTS (Alberta Stroke Program Early CT Score) - Ganglionic level infarction (caudate, lentiform nuclei, internal capsule, insula, M1-M3 cortex): 7 - Supraganglionic infarction (M4-M6 cortex): 3 Total score (0-10 with 10 being normal): 10 IMPRESSION: No acute intracranial hemorrhage or evidence of acute infarction. ASPECT score is 10. These results were communicated to Dr. Iver Nestle at 9:10 pm on 02/07/2020 by text page via the [facility] messaging system. Electronically Signed   By: Guadlupe Spanish M.D.   On: 02/07/2020 21:13   US Abdomen Limited RUQ  Result Date: 01/30/2020 CLINICAL DATA:  Elevated liver enzymes. EXAM: ULTRASOUND ABDOMEN LIMITED RIGHT UPPER QUADRANT COMPARISON:  01/11/2006 abdominal ultrasound. FINDINGS: Gallbladder: Surgically absent. Common bile duct: Diameter: 2.4 mm Liver: No focal lesion identified. Increased parenchymal echogenicity. Portal vein is patent on color Doppler imaging with normal direction of blood flow towards the liver. Other: None. IMPRESSION: Hepatic steatosis.  No focal hepatic lesions. Sequela of cholecystectomy. Electronically Signed   By: Stana Bunting M.D.   On: 01/30/2020 16:50   DG ESOPHAGUS W SINGLE CM (SOL OR THIN BA)  Result Date: 02/02/2020 CLINICAL DATA:  Shortness of breath weakness. EXAM: ESOPHOGRAM/BARIUM SWALLOW TECHNIQUE: Single contrast examination was performed using  thin barium. FLUOROSCOPY TIME:  Fluoroscopy Time:  1 minutes 42 seconds Radiation Exposure  Index (if provided by the fluoroscopic device): 14.3 mGy Number of Acquired Spot Images: 0 COMPARISON:  Chest CT of 01/31/2020 FINDINGS: Esophagus grossly normal. Limited assessment in semi recumbent positioning as the patient could not stand for the evaluation. Distensibility is normal. Mild deviation near the cardiac silhouette likely due to LEFT atrial enlargement based on comparison with recent CT. Motility could not be fully assessed as the patient was unable to lie in the prone RAO position due to current dyspnea. However, on the last swallow obtained there was proximal escape of the bolus with head of bed slightly elevated in moderate tertiary peristaltic activity compatible with esophageal dysmotility. IMPRESSION: 1. Limited study without abnormality aside from signs of mild-to-moderate dysmotility. If there are continued symptoms that would suggest esophageal pathology a study as an outpatient could be performed for more complete assessment when the patient has recovered from this episode  of acute illness. Electronically Signed   By: Donzetta Kohut M.D.   On: 02/02/2020 13:39   IR ANGIO INTRA EXTRACRAN SEL COM CAROTID INNOMINATE BILAT MOD SED  Result Date: 02/12/2020 CLINICAL DATA:  History of vertebrobasilar ischemia with transient diplopia, vertigo, dizziness, and gait imbalance. 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 EXAM: BILATERAL COMMON CAROTID AND INNOMINATE ANGIOGRAPHY COMPARISON:  CT angiogram of the head and neck of February 07, 2020. MEDICATIONS: Heparin 2000 units IA. No antibiotic was administered within 1 hour of the procedure. ANESTHESIA/SEDATION: Versed 1 mg IV; Fentanyl 25 mcg IV Moderate Sedation Time:  52 minutes The patient was continuously monitored during the procedure by the interventional radiology nurse under my direct supervision. CONTRAST:  Isovue 300 approximately 65 mL FLUOROSCOPY TIME:  Fluoroscopy Time: 16 minutes 36 seconds  (1319 mGy). COMPLICATIONS: None immediate. TECHNIQUE: Informed written consent was obtained from the patient after a thorough discussion of the procedural risks, benefits and alternatives. All questions were addressed. Maximal Sterile Barrier Technique was utilized including caps, mask, sterile gowns, sterile gloves, sterile drape, hand hygiene and skin antiseptic. A timeout was performed prior to the initiation of the procedure. The right the right forearm to the wrist was prepped and draped in the usual sterile manner. The right radial artery was then identified with ultrasound, and its morphology documented. A dorsal palmar anastomosis was verified to be present. Using ultrasound guidance access into the right radial artery was obtained with a micropuncture set over a 0.018 inch micro guidewire. Over the micro guidewire, a 4/5 French radial sheath was then inserted. The micro guidewire, and the obturator were removed. Good aspiration was obtained from the side port of the sheath. A cocktail of 2000 units of heparin, 2.5 mg of verapamil, and 200 mcg of nitroglycerin was infused in diluted form through the radial sheath without event. A right radial arteriogram was then obtained. Over a 0.035 inch Roadrunner guidewire, a 5 [place] Simmons 2 diagnostic catheter was advanced to the aortic arch region and selectively positioned in the right vertebral artery, the left subclavian artery, the left common carotid artery and the right common carotid artery. Following the procedure, hemostasis at the right radial puncture site was obtained with a wrist band. Distal right radial pulse was verified to be present. FINDINGS: The right vertebral artery origin is widely patent. The vessel is seen to opacify to the cranial skull base. Patency is maintained of the right posteroinferior cerebellar artery and the right vertebrobasilar junction. The basilar artery to the level of the superior cerebellar artery demonstrates wide  patency. Distal to this there is a severe focal stenosis. Opacification is seen above the superior cerebellar arteries and the anterior-inferior cerebellar arteries into the capillary and venous phases. Proximal opacification of the left posterior cerebral artery is noted. Unopacified blood is seen in the basilar artery from the contralateral non dominant vertebral artery. The non dominant vertebral artery origin is widely patent. The vessel opacifies to the cranial skull base. Diminutive caliber of the left vertebrobasilar junction is seen distal to the left posterior-inferior cerebellar artery. The right common carotid arteriogram demonstrates the right external carotid artery and its major branches to be widely patent. The right internal carotid artery at the bulb to the cranial skull base is widely patent with moderate tortuosity in the mid 1/3. The petrous, cavernous and supraclinoid segments are widely patent. The right posterior communicating artery is seen opacifying the right posterior cerebral artery  distribution. The right middle cerebral artery and the right anterior cerebral artery opacify into the capillary and venous phases. Moderate narrowing is seen of the superior division proximally. The left common carotid arteriogram demonstrates the left external carotid artery origin and its branches to be widely patent. The left internal carotid artery at the bulb has a smooth shallow plaque without evidence of ulcerations or of intraluminal filling defects. The vessel is seen to opacify to the cranial skull base. Patency is seen of the petrous, cavernous and the supraclinoid left ICA. A left posterior communicating artery is seen opacifying the left posterior cerebral artery distribution. The left middle cerebral artery and the left anterior cerebral artery opacify into the capillary and venous phases. 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 PLAN: Findings reviewed with the patient and the referring stroke neurologist. The option of endovascular revascularization of symptomatic high-grade basilar artery stenosis was reviewed in some detail. Procedure, reasons, and potential complications were reviewed with the patient. Patient has expressed her desire to pursue endovascular treatment with angioplasty +/-stenting. This will be scheduled as soon as possible. Electronically Signed   By: Julieanne Cotton M.D.   On: 02/09/2020 15:29   IR ANGIO VERTEBRAL SEL SUBCLAVIAN INNOMINATE UNI L MOD SED  Result Date: 02/12/2020 CLINICAL DATA:  History of vertebrobasilar ischemia with transient diplopia, vertigo, dizziness, and gait imbalance. 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 EXAM: BILATERAL COMMON CAROTID AND INNOMINATE ANGIOGRAPHY COMPARISON:  CT angiogram of the head and neck of February 07, 2020. MEDICATIONS: Heparin 2000 units IA. No antibiotic was administered within 1 hour of the procedure. ANESTHESIA/SEDATION: Versed 1 mg IV; Fentanyl 25 mcg IV Moderate Sedation Time:  52 minutes The patient was continuously monitored during the procedure by the interventional radiology nurse under my direct supervision. CONTRAST:  Isovue 300 approximately 65 mL FLUOROSCOPY TIME:  Fluoroscopy Time: 16 minutes 36 seconds (1319 mGy). COMPLICATIONS: None immediate. TECHNIQUE: Informed written consent was obtained from the patient after a thorough discussion of the procedural risks, benefits and alternatives. All questions were addressed. Maximal Sterile Barrier Technique was utilized including caps, mask, sterile gowns, sterile gloves, sterile drape, hand hygiene and skin antiseptic. A timeout was performed prior to the initiation of the procedure. The right the right forearm to the wrist was prepped and draped in the usual sterile manner. The right radial artery was then identified with  ultrasound, and its morphology documented. A dorsal palmar anastomosis was verified to be present. Using ultrasound guidance access into the right radial artery was obtained with a micropuncture set over a 0.018 inch micro guidewire. Over the micro guidewire, a 4/5 French radial sheath was then inserted. The micro guidewire, and the obturator were removed. Good aspiration was obtained from the side port of the sheath. A cocktail of 2000 units of heparin, 2.5 mg of verapamil, and 200 mcg of nitroglycerin was infused in diluted form through the radial sheath without event. A right radial arteriogram was then obtained. Over a 0.035 inch Roadrunner guidewire, a 5 [place] Simmons 2 diagnostic catheter was advanced to the aortic arch region and selectively positioned in the right vertebral artery, the left subclavian artery, the left common carotid artery and the right common carotid artery. Following the procedure, hemostasis at the right radial puncture site was obtained with a wrist band. Distal right radial pulse was verified to be present. FINDINGS: The  right vertebral artery origin is widely patent. The vessel is seen to opacify to the cranial skull base. Patency is maintained of the right posteroinferior cerebellar artery and the right vertebrobasilar junction. The basilar artery to the level of the superior cerebellar artery demonstrates wide patency. Distal to this there is a severe focal stenosis. Opacification is seen above the superior cerebellar arteries and the anterior-inferior cerebellar arteries into the capillary and venous phases. Proximal opacification of the left posterior cerebral artery is noted. Unopacified blood is seen in the basilar artery from the contralateral non dominant vertebral artery. The non dominant vertebral artery origin is widely patent. The vessel opacifies to the cranial skull base. Diminutive caliber of the left vertebrobasilar junction is seen distal to the left  posterior-inferior cerebellar artery. The right common carotid arteriogram demonstrates the right external carotid artery and its major branches to be widely patent. The right internal carotid artery at the bulb to the cranial skull base is widely patent with moderate tortuosity in the mid 1/3. The petrous, cavernous and supraclinoid segments are widely patent. The right posterior communicating artery is seen opacifying the right posterior cerebral artery distribution. The right middle cerebral artery and the right anterior cerebral artery opacify into the capillary and venous phases. Moderate narrowing is seen of the superior division proximally. The left common carotid arteriogram demonstrates the left external carotid artery origin and its branches to be widely patent. The left internal carotid artery at the bulb has a smooth shallow plaque without evidence of ulcerations or of intraluminal filling defects. The vessel is seen to opacify to the cranial skull base. Patency is seen of the petrous, cavernous and the supraclinoid left ICA. A left posterior communicating artery is seen opacifying the left posterior cerebral artery distribution. The left middle cerebral artery and the left anterior cerebral artery opacify into the capillary and venous phases. 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 PLAN: Findings reviewed with the patient and the referring stroke neurologist. The option of endovascular revascularization of symptomatic high-grade basilar artery stenosis was reviewed in some detail. Procedure, reasons, and potential complications were reviewed with the patient. Patient has expressed her desire to pursue endovascular treatment with angioplasty +/-stenting. This will be scheduled as soon as possible. Electronically Signed   By: Julieanne Cotton M.D.   On: 02/09/2020 15:29   IR ANGIO VERTEBRAL SEL VERTEBRAL UNI R  MOD SED  Result Date: 02/12/2020 CLINICAL DATA:  History of vertebrobasilar ischemia with transient diplopia, vertigo, dizziness, and gait imbalance. 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 EXAM: BILATERAL COMMON CAROTID AND INNOMINATE ANGIOGRAPHY COMPARISON:  CT angiogram of the head and neck of February 07, 2020. MEDICATIONS: Heparin 2000 units IA. No antibiotic was administered within 1 hour of the procedure. ANESTHESIA/SEDATION: Versed 1 mg IV; Fentanyl 25 mcg IV Moderate Sedation Time:  52 minutes The patient was continuously monitored during the procedure by the interventional radiology nurse under my direct supervision. CONTRAST:  Isovue 300 approximately 65 mL FLUOROSCOPY TIME:  Fluoroscopy Time: 16 minutes 36 seconds (1319 mGy). COMPLICATIONS: None immediate. TECHNIQUE: Informed written consent was obtained from the patient after a thorough discussion of the procedural risks, benefits and alternatives. All questions were addressed. Maximal Sterile Barrier Technique was utilized including caps, mask, sterile gowns, sterile gloves, sterile drape, hand hygiene and skin antiseptic. A timeout was performed prior to the initiation of the procedure.  The right the right forearm to the wrist was prepped and draped in the usual sterile manner. The right radial artery was then identified with ultrasound, and its morphology documented. A dorsal palmar anastomosis was verified to be present. Using ultrasound guidance access into the right radial artery was obtained with a micropuncture set over a 0.018 inch micro guidewire. Over the micro guidewire, a 4/5 French radial sheath was then inserted. The micro guidewire, and the obturator were removed. Good aspiration was obtained from the side port of the sheath. A cocktail of 2000 units of heparin, 2.5 mg of verapamil, and 200 mcg of nitroglycerin was infused in diluted form through the radial sheath without event. A right  radial arteriogram was then obtained. Over a 0.035 inch Roadrunner guidewire, a 5 [place] Simmons 2 diagnostic catheter was advanced to the aortic arch region and selectively positioned in the right vertebral artery, the left subclavian artery, the left common carotid artery and the right common carotid artery. Following the procedure, hemostasis at the right radial puncture site was obtained with a wrist band. Distal right radial pulse was verified to be present. FINDINGS: The right vertebral artery origin is widely patent. The vessel is seen to opacify to the cranial skull base. Patency is maintained of the right posteroinferior cerebellar artery and the right vertebrobasilar junction. The basilar artery to the level of the superior cerebellar artery demonstrates wide patency. Distal to this there is a severe focal stenosis. Opacification is seen above the superior cerebellar arteries and the anterior-inferior cerebellar arteries into the capillary and venous phases. Proximal opacification of the left posterior cerebral artery is noted. Unopacified blood is seen in the basilar artery from the contralateral non dominant vertebral artery. The non dominant vertebral artery origin is widely patent. The vessel opacifies to the cranial skull base. Diminutive caliber of the left vertebrobasilar junction is seen distal to the left posterior-inferior cerebellar artery. The right common carotid arteriogram demonstrates the right external carotid artery and its major branches to be widely patent. The right internal carotid artery at the bulb to the cranial skull base is widely patent with moderate tortuosity in the mid 1/3. The petrous, cavernous and supraclinoid segments are widely patent. The right posterior communicating artery is seen opacifying the right posterior cerebral artery distribution. The right middle cerebral artery and the right anterior cerebral artery opacify into the capillary and venous phases. Moderate  narrowing is seen of the superior division proximally. The left common carotid arteriogram demonstrates the left external carotid artery origin and its branches to be widely patent. The left internal carotid artery at the bulb has a smooth shallow plaque without evidence of ulcerations or of intraluminal filling defects. The vessel is seen to opacify to the cranial skull base. Patency is seen of the petrous, cavernous and the supraclinoid left ICA. A left posterior communicating artery is seen opacifying the left posterior cerebral artery distribution. The left middle cerebral artery and the left anterior cerebral artery opacify into the capillary and venous phases. 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 PLAN: Findings reviewed with the patient and the referring stroke neurologist. The option of endovascular revascularization of symptomatic high-grade basilar artery stenosis was reviewed in some detail. Procedure, reasons, and potential complications were reviewed with the patient. Patient has expressed her desire to pursue endovascular treatment with angioplasty +/-stenting. This will be scheduled as soon as possible. Electronically Signed   By: Simonne Maffucci  Deveshwar M.D.   On: 02/09/2020 15:29    DISCHARGE EXAMINATION: Vitals:   02/13/20 1956 02/13/20 2355 02/14/20 0355 02/14/20 0756  BP: 120/63 (!) 105/55 (!) 116/59 (!) 117/59  Pulse: 70 71 68 67  Resp: 16 15 14 17   Temp: 97.7 F (36.5 C) 98 F (36.7 C)  98 F (36.7 C)  TempSrc: Oral Oral  Oral  SpO2: 98% 98% 99% 99%  Weight:      Height:       General appearance: Awake alert.  In no distress Resp: Clear to auscultation bilaterally.  Normal effort Cardio: S1-S2 is normal regular.  No S3-S4.  No rubs murmurs or bruit GI: Abdomen is soft.  Nontender nondistended.  Bowel sounds are present normal.  No masses organomegaly    DISPOSITION: Home  Discharge  Instructions    Ambulatory referral to Neurology   Complete by: As directed    An appointment is requested in approximately: 8 weeks   Call MD for:  difficulty breathing, headache or visual disturbances   Complete by: As directed    Call MD for:  extreme fatigue   Complete by: As directed    Call MD for:  persistant dizziness or light-headedness   Complete by: As directed    Call MD for:  persistant nausea and vomiting   Complete by: As directed    Call MD for:  severe uncontrolled pain   Complete by: As directed    Call MD for:  temperature >100.4   Complete by: As directed    Diet - low sodium heart healthy   Complete by: As directed    Discharge instructions   Complete by: As directed    Please take your medications as instructed.  You will get a call from the cardiology office regarding outpatient procedures to be done in the next 1 to 2 weeks including a TEE and a loop recorder placement.  You will need to follow-up with neurology in the next 3 months.  You were cared for by a hospitalist during your hospital stay. If you have any questions about your discharge medications or the care you received while you were in the hospital after you are discharged, you can call the unit and asked to speak with the hospitalist on call if the hospitalist that took care of you is not available. Once you are discharged, your primary care physician will handle any further medical issues. Please note that NO REFILLS for any discharge medications will be authorized once you are discharged, as it is imperative that you return to your primary care physician (or establish a relationship with a primary care physician if you do not have one) for your aftercare needs so that they can reassess your need for medications and monitor your lab values. If you do not have a primary care physician, you can call [phone] for a physician referral.   Increase activity slowly   Complete by: As directed    No wound care    Complete by: As directed        Allergies as of 02/14/2020      Reactions   Metolazone Other (See Comments)   Metabolic mood swings      Medication List    STOP taking these medications   feeding supplement (GLUCERNA SHAKE) Liqd     TAKE these medications   albuterol (2.5 MG/3ML) 0.083% nebulizer solution Commonly known as: PROVENTIL INHALE 1 VIAL VIA NEBULIZER EVERY 6 HOURS AS NEEDED FOR WHEEZING/SHORTNESS OF  BREATH What changed: See the new instructions.   ProAir RespiClick 108 (90 Base) MCG/ACT Aepb Generic drug: Albuterol Sulfate INHALE 2 PUFFS INTO THE LUNGS EVERY 6 HOURS AS NEEDED FOR DYSPNEA/WHEEZE What changed: See the new instructions.   aspirin 81 MG EC tablet Take 1 tablet (81 mg total) by mouth daily. Swallow whole.   atorvastatin 40 MG tablet Commonly known as: LIPITOR Take 1 tablet (40 mg total) by mouth at bedtime. What changed:   medication strength  how much to take   benzonatate 200 MG capsule Commonly known as: TESSALON Take 1 capsule (200 mg total) by mouth 3 (three) times daily as needed for cough. Swallow whole, to not bite pill   Breztri Aerosphere 160-9-4.8 MCG/ACT Aero Generic drug: Budeson-Glycopyrrol-Formoterol Inhale 2 puffs into the lungs 2 (two) times daily.   clopidogrel 75 MG tablet Commonly known as: PLAVIX Take 1 tablet (75 mg total) by mouth daily.   dextromethorphan-guaiFENesin 30-600 MG 12hr tablet Commonly known as: MUCINEX DM Take 1 tablet by mouth 2 (two) times daily as needed for cough.   escitalopram 20 MG tablet Commonly known as: LEXAPRO TAKE 1 AND 1/2 TABLETS DAILY BY MOUTH What changed: See the new instructions.   famotidine 20 MG tablet Commonly known as: PEPCID Take 20 mg by mouth at bedtime.   fluticasone 50 MCG/ACT nasal spray Commonly known as: FLONASE Place 1 spray into both nostrils 2 (two) times daily.   furosemide 20 MG tablet Commonly known as: LASIX Take 1 tablet (20 mg total) by mouth  daily.   gabapentin 300 MG capsule Commonly known as: NEURONTIN TAKE 2 TABLETS IN THE MORNING AND 3 TABLETS AT NIGHT What changed:   how much to take  how to take this  when to take this   LORazepam 0.5 MG tablet Commonly known as: ATIVAN TAKE 1/2 TO 1 TABLET BY MOUTH TWICE A DAY AS NEEDED FOR ANXIETY What changed: See the new instructions.   losartan 25 MG tablet Commonly known as: Cozaar Take 0.5 tablets (12.5 mg total) by mouth daily. RESUME ON 02/18/20 Start taking on: February 18, 2020 What changed:   additional instructions  These instructions start on February 18, 2020. If you are unsure what to do until then, ask your doctor or other care provider.   metFORMIN 500 MG tablet Commonly known as: GLUCOPHAGE TAKE 1 TABLET BY MOUTH EVERYDAY AT BEDTIME What changed: See the new instructions.   metoprolol succinate 25 MG 24 hr tablet Commonly known as: TOPROL-XL Take 0.5 tablets (12.5 mg total) by mouth daily.   multivitamin with minerals Tabs tablet Take 1 tablet by mouth daily.   nystatin 100000 UNIT/ML suspension Commonly known as: MYCOSTATIN TAKE 1 TEASPOONFUL BY MOUTH 3 TIMES DAILY AS NEEDED FOR MOUTH SORES. What changed:   how much to take  how to take this  when to take this  reasons to take this  additional instructions   pantoprazole 40 MG tablet Commonly known as: PROTONIX TAKE 1 TABLET BY MOUTH EVERY DAY 30 TO 60 MINUTES BEFORE THE FIRST MEAL OF THE DAY What changed:   how much to take  how to take this  when to take this  additional instructions   promethazine-dextromethorphan 6.25-15 MG/5ML syrup Commonly known as: PROMETHAZINE-DM Take 5 mLs by mouth 4 (four) times daily as needed for cough.         Follow-up Information    Julieanne Cottoneveshwar, Sanjeev, MD Follow up in 3 month(s).   Specialties: Interventional Radiology, Radiology  Why: Please plan for follow-up with Dr. Corliss Skains with a CTA head/neck 3 months after discharge. Our  office will call you to set up this appointment. Contact information: [address] [place] [address] [phone]        [name], MD. Schedule an appointment as soon as possible for a visit in 1 week(s).   Specialty: Family Medicine Contact information: [address] [place] [address] [phone]        [facility] Follow up in 4 week(s).   Specialty: Neurology Why: stroke clinic, office will call wtih appt date and time Contact information: [address] [place] [address] [phone]              TOTAL DISCHARGE TIME: 35 minutes  Nazli Penn Rito Ehrlich  [facility] Pager on [email or web address]  02/14/2020, 1:43 PM

## 2020-02-15 ENCOUNTER — Other Ambulatory Visit: Payer: Self-pay

## 2020-02-15 ENCOUNTER — Other Ambulatory Visit: Payer: Self-pay | Admitting: Family Medicine

## 2020-02-15 ENCOUNTER — Other Ambulatory Visit: Payer: Self-pay | Admitting: Internal Medicine

## 2020-02-15 MED ORDER — PROMETHAZINE-DM 6.25-15 MG/5ML PO SYRP: 5.0000 mL | ORAL_SOLUTION | Freq: Four times a day (QID) | ORAL | 0 refills | Status: DC | PRN

## 2020-02-15 NOTE — Telephone Encounter (Signed)
Dr. Sherene Sires, please advise if you are okay refilling med or if pt needs to have this deferred to another physician.

## 2020-02-15 NOTE — Telephone Encounter (Signed)
MW pt is requesting a refill for the promethazine with codeine cough meds.    Last filled  09/15/2019 Last ov was 09/15/2019 No pending appts.

## 2020-02-16 SURGERY — ECHOCARDIOGRAM, TRANSESOPHAGEAL
Anesthesia: Monitor Anesthesia Care

## 2020-02-18 ENCOUNTER — Encounter: Payer: Self-pay | Admitting: Family Medicine

## 2020-02-18 DIAGNOSIS — I651 Occlusion and stenosis of basilar artery: Secondary | ICD-10-CM | POA: Insufficient documentation

## 2020-02-19 ENCOUNTER — Other Ambulatory Visit: Payer: Self-pay | Admitting: Family Medicine

## 2020-02-19 NOTE — Telephone Encounter (Signed)
Name of Medication: Lorazepam Name of Pharmacy: [facility] [address] Last Fill or Written Date and Quantity: 01/17/20, #30 Last Office Visit and Type: 08/07/19, [facility] Next Office Visit and Type: 02/23/20, hosp f/u Last Controlled Substance Agreement Date: none Last UDS: none

## 2020-02-21 NOTE — Telephone Encounter (Signed)
ERx 

## 2020-02-23 ENCOUNTER — Other Ambulatory Visit: Payer: Self-pay

## 2020-02-23 ENCOUNTER — Encounter: Payer: Self-pay | Admitting: Family Medicine

## 2020-02-23 ENCOUNTER — Ambulatory Visit (INDEPENDENT_AMBULATORY_CARE_PROVIDER_SITE_OTHER): Payer: Medicaid Other | Admitting: Family Medicine

## 2020-02-23 VITALS — BP 128/66 | HR 73 | Temp 97.6°F | Ht 61.0 in | Wt 226.4 lb

## 2020-02-23 DIAGNOSIS — Z9989 Dependence on other enabling machines and devices: Secondary | ICD-10-CM

## 2020-02-23 DIAGNOSIS — D72829 Elevated white blood cell count, unspecified: Secondary | ICD-10-CM

## 2020-02-23 DIAGNOSIS — Z23 Encounter for immunization: Secondary | ICD-10-CM | POA: Diagnosis not present

## 2020-02-23 DIAGNOSIS — G459 Transient cerebral ischemic attack, unspecified: Secondary | ICD-10-CM

## 2020-02-23 DIAGNOSIS — Z6841 Body Mass Index (BMI) 40.0 and over, adult: Secondary | ICD-10-CM

## 2020-02-23 DIAGNOSIS — I5042 Chronic combined systolic (congestive) and diastolic (congestive) heart failure: Secondary | ICD-10-CM | POA: Diagnosis not present

## 2020-02-23 DIAGNOSIS — I2781 Cor pulmonale (chronic): Secondary | ICD-10-CM | POA: Diagnosis not present

## 2020-02-23 DIAGNOSIS — G4733 Obstructive sleep apnea (adult) (pediatric): Secondary | ICD-10-CM

## 2020-02-23 DIAGNOSIS — J449 Chronic obstructive pulmonary disease, unspecified: Secondary | ICD-10-CM

## 2020-02-23 DIAGNOSIS — J9611 Chronic respiratory failure with hypoxia: Secondary | ICD-10-CM

## 2020-02-23 DIAGNOSIS — I651 Occlusion and stenosis of basilar artery: Secondary | ICD-10-CM

## 2020-02-23 DIAGNOSIS — F39 Unspecified mood [affective] disorder: Secondary | ICD-10-CM

## 2020-02-23 MED ORDER — ARIPIPRAZOLE 5 MG PO TABS: 5.0000 mg | ORAL_TABLET | Freq: Every day | ORAL | 3 refills | Status: DC

## 2020-02-23 MED ORDER — BENZONATATE 200 MG PO CAPS: 200.0000 mg | ORAL_CAPSULE | Freq: Three times a day (TID) | ORAL | 2 refills | Status: DC | PRN

## 2020-02-23 NOTE — Patient Instructions (Addendum)
Flu shot today I'm glad you got first COVID shot.  For mood - continue current medicines and add on abilify 5mg  once daily. I would also like to refer you to psychiatrist for further evaluation of bipolar.  Keep cardiology and neurology appointments.

## 2020-02-23 NOTE — Progress Notes (Signed)
This visit was conducted in person.  BP 128/66 (BP Location: Right Arm, Patient Position: Sitting, Cuff Size: Large)   Pulse 73   Temp 97.6 F (36.4 C) (Temporal)   Ht 5\' 1"  (1.549 m)   Wt 226 lb 6 oz (102.7 kg)   LMP 03/08/2013   SpO2 98% Comment: 2 L  BMI 42.77 kg/m    CC: hosp f/u visit  Subjective:    Patient ID: [id number], female    DOB: [birth year], 59 y.o.   MRN: [id number]  HPI: Brenda [name] is a 59 y.o. female presenting on 02/23/2020 for Hospitalization Follow-up    Recent hospitalization for NSTEMI vs demand ischemia late last month in setting of RSV pneumonia and COPD exacerbation, then rehospitalized 02/07/2020 with acute L sided weakness, slurred speech, n/v. Head CT and brain MRI negative for acute stroke but CTA head/neck showed basilar artery stenosis. Proceeded with cerebral angiogram revealing significant improvement of distal basilar artery and left PCA P1 with small post wall filling defect of distal basilar artery - so no stenting intervention performed. IR has recommended repeat CTA with contrast in 3 months. Neurology recommended TEE and loop recorder placement - planned outpatient. Aspirin continued, plavix started. Planned 3 months DAPT with subsequent neuro f/u.   Known chronic combined CHF continues 2-3L [place] at baseline, also with COPD.  Upcoming cards and neuro appts.  Struggling with mood swings and emotional lability. Marked fatigue and anhedonia. Large portion is situational due to increased physical limitations from recent illnesses. Also lost husband 3 yrs ago. Longstanding antidepressant treatment with escitalopram 30mg  daily, lorazepam 0.5mg  1/2-1 tab nightly. Denies significant manic episodes but most days can't sleep more than 2-3 hours per night. Daughter is bipolar. Patient endorses remote bipolar dx - but has not taken mood stabilizer.    Admit date: 02/07/2020 Discharge date: 02/14/2020 TCM hosp f/u phone call completed 02/14/2020  DISCHARGE  DIAGNOSES:  TIA Diabetes mellitus type 2, uncontrolled with peripheral neuropathy Chronic respiratory failure with hypoxia on home oxygen History of COPD History of OSA CKD stage IIIa  RECOMMENDATIONS FOR OUTPATIENT FOLLOW UP: 1. Follow-up with neurology in 3 months  Consultations:  Neurology  Interventional radiology  Procedures: Cerebral angiogram  Home Health: Home health has been ordered Equipment/Devices: None  CODE STATUS: Full code DISCHARGE CONDITION: fair Diet recommendation: Modified carbohydrate heart healthy     Relevant past medical, surgical, family and social history reviewed and updated as indicated. Interim medical history since our last visit reviewed. Allergies and medications reviewed and updated. Outpatient Medications Prior to Visit  Medication Sig Dispense Refill  . albuterol (PROVENTIL) (2.5 MG/3ML) 0.083% nebulizer solution INHALE 1 VIAL VIA NEBULIZER EVERY 6 HOURS AS NEEDED FOR WHEEZING/SHORTNESS OF BREATH (Patient taking differently: Take 2.5 mg by nebulization every 6 (six) hours as needed for wheezing or shortness of breath. ) 150 mL 1  . aspirin EC 81 MG EC tablet Take 1 tablet (81 mg total) by mouth daily. Swallow whole. 30 tablet 0  . atorvastatin (LIPITOR) 40 MG tablet Take 1 tablet (40 mg total) by mouth at bedtime. 30 tablet 2  . Budeson-Glycopyrrol-Formoterol (BREZTRI AEROSPHERE) 160-9-4.8 MCG/ACT AERO Inhale 2 puffs into the lungs 2 (two) times daily. 10.7 g 11  . clopidogrel (PLAVIX) 75 MG tablet Take 1 tablet (75 mg total) by mouth daily. 30 tablet 3  . dextromethorphan-guaiFENesin (MUCINEX DM) 30-600 MG 12hr tablet Take 1 tablet by mouth 2 (two) times daily as needed for cough.    02/16/2020  escitalopram (LEXAPRO) 20 MG tablet TAKE 1 AND 1/2 TABLETS DAILY BY MOUTH (Patient taking differently: Take 30 mg by mouth daily. ) 135 tablet 2  . famotidine (PEPCID) 20 MG tablet Take 20 mg by mouth at bedtime.    . fluticasone (FLONASE) 50 MCG/ACT  nasal spray Place 1 spray into both nostrils 2 (two) times daily. 16 g 11  . furosemide (LASIX) 20 MG tablet TAKE 2 TABLETS BY MOUTH EVERY DAY 60 tablet 2  . gabapentin (NEURONTIN) 300 MG capsule TAKE 2 TABLETS IN THE MORNING AND 3 TABLETS AT NIGHT (Patient taking differently: Take 600-900 mg by mouth See admin instructions. TAKE 2 TABLETS IN THE MORNING AND 3 TABLETS AT NIGHT) 450 capsule 1  . LORazepam (ATIVAN) 0.5 MG tablet TAKE 1/2 TO 1 TABLET BY MOUTH TWICE A DAY AS NEEDED FOR ANXIETY 30 tablet 0  . losartan (COZAAR) 25 MG tablet Take 0.5 tablets (12.5 mg total) by mouth daily. RESUME ON 02/18/20 15 tablet 0  . metFORMIN (GLUCOPHAGE) 500 MG tablet TAKE 1 TABLET BY MOUTH EVERYDAY AT BEDTIME (Patient taking differently: Take 500 mg by mouth daily with breakfast. ) 90 tablet 3  . metoprolol succinate (TOPROL-XL) 25 MG 24 hr tablet Take 0.5 tablets (12.5 mg total) by mouth daily. 15 tablet 0  . Multiple Vitamin (MULTIVITAMIN WITH MINERALS) TABS tablet Take 1 tablet by mouth daily.    Marland Kitchen. nystatin (MYCOSTATIN) 100000 UNIT/ML suspension TAKE 1 TEASPOONFUL BY MOUTH 3 TIMES DAILY AS NEEDED FOR MOUTH SORES. (Patient taking differently: Take 5 mLs by mouth 3 (three) times daily as needed (mouth sores). ) 150 mL 1  . PROAIR RESPICLICK 108 (90 Base) MCG/ACT AEPB INHALE 2 PUFFS INTO THE LUNGS EVERY 6 HOURS AS NEEDED FOR DYSPNEA/WHEEZE 2 each 3  . promethazine-dextromethorphan (PROMETHAZINE-DM) 6.25-15 MG/5ML syrup Take 5 mLs by mouth 4 (four) times daily as needed for cough. 240 mL 0  . benzonatate (TESSALON) 200 MG capsule Take 1 capsule (200 mg total) by mouth 3 (three) times daily as needed for cough. Swallow whole, to not bite pill 45 capsule 2  . pantoprazole (PROTONIX) 40 MG tablet TAKE 1 TABLET BY MOUTH EVERY DAY 30 TO 60 MINUTES BEFORE THE FIRST MEAL OF THE DAY (Patient taking differently: Take 40 mg by mouth daily. ) 90 tablet 1   No facility-administered medications prior to visit.     Per HPI unless  specifically indicated in ROS section below Review of Systems Objective:  BP 128/66 (BP Location: Right Arm, Patient Position: Sitting, Cuff Size: Large)   Pulse 73   Temp 97.6 F (36.4 C) (Temporal)   Ht 5\' 1"  (1.549 m)   Wt 226 lb 6 oz (102.7 kg)   LMP 03/08/2013   SpO2 98% Comment: 2 L  BMI 42.77 kg/m   Wt Readings from Last 3 Encounters:  02/23/20 226 lb 6 oz (102.7 kg)  02/08/20 211 lb 6.7 oz (95.9 kg)  02/04/20 221 lb 8 oz (100.5 kg)      Physical Exam Vitals and nursing note reviewed.  Constitutional:      Appearance: Normal appearance. She is obese.  Cardiovascular:     Rate and Rhythm: Normal rate and regular rhythm.     Pulses: Normal pulses.     Heart sounds: Normal heart sounds. No murmur heard.   Pulmonary:     Effort: Pulmonary effort is normal. No respiratory distress.     Breath sounds: Normal breath sounds. No wheezing, rhonchi or rales.  Musculoskeletal:  Right lower leg: No edema.     Left lower leg: No edema.  Neurological:     Mental Status: She is alert.  Psychiatric:        Mood and Affect: Mood normal.        Behavior: Behavior normal.       Lab Results  Component Value Date   CHOL 117 02/09/2020   HDL 37 (L) 02/09/2020   LDLCALC 52 02/09/2020   TRIG 139 02/09/2020   CHOLHDL 3.2 02/09/2020   Lab Results  Component Value Date   CREATININE 0.88 02/13/2020   BUN 11 02/13/2020   NA 138 02/13/2020   K 4.2 02/13/2020   CL 105 02/13/2020   CO2 26 02/13/2020    Lab Results  Component Value Date   HGBA1C 6.1 (H) 02/10/2020    Depression screen PHQ 2/9 02/23/2020 08/07/2019 11/28/2018 07/27/2018 02/24/2017  Decreased Interest 3 2 1 1 3   Down, Depressed, Hopeless 3 2 2 1 3   PHQ - 2 Score 6 4 3 2 6   Altered sleeping 2 1 1 3 2   Tired, decreased energy 3 3 2 3 3   Change in appetite 1 2 0 0 2  Feeling bad or failure about yourself  3 1 2  0 0  Trouble concentrating 2 3 1 1 2   Moving slowly or fidgety/restless 3 3 2 2  0  Suicidal thoughts 0  0 0 0 0  PHQ-9 Score 20 17 11 11 15   Some recent data might be hidden    GAD 7 : Generalized Anxiety Score 02/23/2020 08/07/2019 11/28/2018 07/27/2018  Nervous, Anxious, on Edge 3 3 2 2   Control/stop worrying 3 3 1 1   Worry too much - different things 3 2 1 1   Trouble relaxing 3 2 1 2   Restless 1 2 2 3   Easily annoyed or irritable 3 2 1 1   Afraid - awful might happen 2 3 1 1   Total GAD 7 Score 18 17 9 11    MDQ = 5 - negative screen Assessment & Plan:  This visit occurred during the SARS-CoV-2 public health emergency.  Safety protocols were in place, including screening questions prior to the visit, additional usage of staff PPE, and extensive cleaning of exam room while observing appropriate contact time as indicated for disinfecting solutions.   Problem List Items Addressed This Visit    TIA (transient ischemic attack)    Reassuring imaging.  Now on DAPT, continues atorvastatin.       OSA on CPAP   Morbid obesity with BMI of 40.0-44.9, adult (HCC)   Mood disorder (HCC)    Struggling with mood lability with recent illnesses stressors despite regular lexapro 30mg  and lorazepam. Endorses remote h/o bipolar diagnosis although has not recently used mood stabilizers. Reviewed options - will start abilify 5mg  daily and refer to psychiatry for further evaluation. Pt agrees with plan.  MDQ screen negative, high scores for both PHQ and GAD7.  There is situational component to mood disorder given recent medical illnesses.       Relevant Orders   Ambulatory referral to Psychiatry   Leukocytosis    Chronic. periph smear previously stable. Several prednisone courses recently could cause elevation. Discussed possible heme eval if staying elevated next labwork (while off prednisone).       Cor pulmonale (chronic) (HCC)   COPD  GOLD II/III still smoking/ 02 dep    Relevant Medications   benzonatate (TESSALON) 200 MG capsule   Chronic respiratory failure with hypoxia (  HCC)    Continues  supplemental oxygen at 2-3 L per [place]      Chronic combined systolic and diastolic CHF (congestive heart failure) (HCC)    Stable period. Established with cardiology. Discussing TEE and loop recorder.       Basilar artery stenosis - Primary    S/p intervention attempt during latest hospitalization but it seemed to clear on its own. Started on DAPT (plavix added) with plan for 3 months then f/u with neurology. Also plan rpt CTA in 3 months.        Other Visit Diagnoses    Need for influenza vaccination       Relevant Orders   Flu Vaccine QUAD 36+ mos IM (Completed)       Meds ordered this encounter  Medications  . benzonatate (TESSALON) 200 MG capsule    Sig: Take 1 capsule (200 mg total) by mouth 3 (three) times daily as needed for cough. Swallow whole, to not bite pill    Dispense:  45 capsule    Refill:  2  . ARIPiprazole (ABILIFY) 5 MG tablet    Sig: Take 1 tablet (5 mg total) by mouth daily.    Dispense:  30 tablet    Refill:  3   Orders Placed This Encounter  Procedures  . Flu Vaccine QUAD 36+ mos IM  . Ambulatory referral to Psychiatry    Referral Priority:   Routine    Referral Type:   Psychiatric    Referral Reason:   Specialty Services Required    Requested Specialty:   Psychiatry    Number of Visits Requested:   1    Patient Instructions  Flu shot today I'm glad you got first COVID shot.  For mood - continue current medicines and add on abilify 5mg  once daily. I would also like to refer you to psychiatrist for further evaluation of bipolar.  Keep cardiology and neurology appointments.    Follow up plan: Return if symptoms worsen or fail to improve.  , MD

## 2020-02-24 ENCOUNTER — Encounter: Payer: Self-pay | Admitting: Family Medicine

## 2020-02-24 NOTE — Assessment & Plan Note (Signed)
Stable period. Established with cardiology. Discussing TEE and loop recorder.

## 2020-02-24 NOTE — Assessment & Plan Note (Addendum)
Struggling with mood lability with recent illnesses stressors despite regular lexapro 30mg  and lorazepam. Endorses remote h/o bipolar diagnosis although has not recently used mood stabilizers. Reviewed options - will start abilify 5mg  daily and refer to psychiatry for further evaluation. Pt agrees with plan.  MDQ screen negative, high scores for both PHQ and GAD7.  There is situational component to mood disorder given recent medical illnesses.

## 2020-02-24 NOTE — Assessment & Plan Note (Signed)
Reassuring imaging.  Now on DAPT, continues atorvastatin.

## 2020-02-24 NOTE — Assessment & Plan Note (Signed)
Chronic. periph smear previously stable. Several prednisone courses recently could cause elevation. Discussed possible heme eval if staying elevated next labwork (while off prednisone).

## 2020-02-24 NOTE — Assessment & Plan Note (Addendum)
S/p intervention attempt during latest hospitalization but it seemed to clear on its own. Started on DAPT (plavix added) with plan for 3 months then f/u with neurology. Also plan rpt CTA in 3 months.

## 2020-02-24 NOTE — Assessment & Plan Note (Signed)
Continues supplemental oxygen at 2-3 L per [place]

## 2020-02-27 ENCOUNTER — Other Ambulatory Visit: Payer: Self-pay | Admitting: Internal Medicine

## 2020-02-27 ENCOUNTER — Other Ambulatory Visit: Payer: Self-pay | Admitting: Family Medicine

## 2020-02-28 NOTE — Telephone Encounter (Signed)
Gabapentin Last filled:  12/24/19, #450 Last OV:  02/23/20, hosp f/u Next OV:  none

## 2020-03-06 ENCOUNTER — Emergency Department (HOSPITAL_COMMUNITY)
Admission: EM | Admit: 2020-03-06 | Discharge: 2020-03-06 | Disposition: A | Discharge status: Home / Self Care | Payer: Medicare Other | Attending: Emergency Medicine | Admitting: Emergency Medicine

## 2020-03-06 ENCOUNTER — Other Ambulatory Visit: Payer: Self-pay

## 2020-03-06 ENCOUNTER — Encounter (HOSPITAL_COMMUNITY): Payer: Self-pay

## 2020-03-06 ENCOUNTER — Emergency Department (HOSPITAL_BASED_OUTPATIENT_CLINIC_OR_DEPARTMENT_OTHER): Payer: Medicare Other

## 2020-03-06 DIAGNOSIS — Z7982 Long term (current) use of aspirin: Secondary | ICD-10-CM | POA: Diagnosis not present

## 2020-03-06 DIAGNOSIS — I130 Hypertensive heart and chronic kidney disease with heart failure and stage 1 through stage 4 chronic kidney disease, or unspecified chronic kidney disease: Secondary | ICD-10-CM | POA: Insufficient documentation

## 2020-03-06 DIAGNOSIS — I5042 Chronic combined systolic (congestive) and diastolic (congestive) heart failure: Secondary | ICD-10-CM | POA: Insufficient documentation

## 2020-03-06 DIAGNOSIS — M660 Rupture of popliteal cyst: Secondary | ICD-10-CM | POA: Diagnosis not present

## 2020-03-06 DIAGNOSIS — M79609 Pain in unspecified limb: Secondary | ICD-10-CM | POA: Diagnosis not present

## 2020-03-06 DIAGNOSIS — J45909 Unspecified asthma, uncomplicated: Secondary | ICD-10-CM | POA: Insufficient documentation

## 2020-03-06 DIAGNOSIS — J441 Chronic obstructive pulmonary disease with (acute) exacerbation: Secondary | ICD-10-CM | POA: Insufficient documentation

## 2020-03-06 DIAGNOSIS — N1831 Chronic kidney disease, stage 3a: Secondary | ICD-10-CM | POA: Diagnosis not present

## 2020-03-06 DIAGNOSIS — M7989 Other specified soft tissue disorders: Secondary | ICD-10-CM

## 2020-03-06 DIAGNOSIS — Z7984 Long term (current) use of oral hypoglycemic drugs: Secondary | ICD-10-CM | POA: Diagnosis not present

## 2020-03-06 DIAGNOSIS — R2240 Localized swelling, mass and lump, unspecified lower limb: Secondary | ICD-10-CM | POA: Diagnosis present

## 2020-03-06 DIAGNOSIS — M7121 Synovial cyst of popliteal space [Baker], right knee: Secondary | ICD-10-CM | POA: Diagnosis not present

## 2020-03-06 DIAGNOSIS — E1140 Type 2 diabetes mellitus with diabetic neuropathy, unspecified: Secondary | ICD-10-CM | POA: Insufficient documentation

## 2020-03-06 DIAGNOSIS — Z79899 Other long term (current) drug therapy: Secondary | ICD-10-CM | POA: Insufficient documentation

## 2020-03-06 DIAGNOSIS — L538 Other specified erythematous conditions: Secondary | ICD-10-CM | POA: Diagnosis not present

## 2020-03-06 DIAGNOSIS — F17210 Nicotine dependence, cigarettes, uncomplicated: Secondary | ICD-10-CM | POA: Insufficient documentation

## 2020-03-06 LAB — BASIC METABOLIC PANEL
Anion gap: 11 (ref 5–15)
BUN: 13 mg/dL (ref 8–23)
CO2: 27 mmol/L (ref 22–32)
Calcium: 9.6 mg/dL (ref 8.9–10.3)
Chloride: 104 mmol/L (ref 98–111)
Creatinine, Ser: 0.9 mg/dL (ref 0.44–1.00)
GFR calc non Af Amer: >60 mL/min (ref >60)
Glucose, Bld: 102 mg/dL — ABNORMAL HIGH (ref 70–99)
Potassium: 4.1 mmol/L (ref 3.5–5.1)
Sodium: 142 mmol/L (ref 135–145)

## 2020-03-06 LAB — CBC
HCT: 34.8 % — ABNORMAL LOW (ref 36.0–46.0)
Hemoglobin: 11.2 g/dL — ABNORMAL LOW (ref 12.0–15.0)
MCH: 30.5 pg (ref 26.0–34.0)
MCHC: 32.2 g/dL (ref 30.0–36.0)
MCV: 94.8 fL (ref 80.0–100.0)
Platelets: 319 10*3/uL (ref 150–400)
RBC: 3.67 MIL/uL — ABNORMAL LOW (ref 3.87–5.11)
RDW: 13.8 % (ref 11.5–15.5)
WBC: 15.2 10*3/uL — ABNORMAL HIGH (ref 4.0–10.5)
nRBC: 0 % (ref 0.0–0.2)

## 2020-03-06 LAB — D-DIMER, QUANTITATIVE: D-Dimer, Quant: 0.87 ug/mL-FEU — ABNORMAL HIGH (ref 0.00–0.50)

## 2020-03-06 MED ORDER — HYDROCODONE-ACETAMINOPHEN 5-325 MG PO TABS: 1.0000 | ORAL_TABLET | ORAL | 0 refills | Status: DC | PRN

## 2020-03-06 MED ORDER — HYDROCODONE-ACETAMINOPHEN 5-325 MG PO TABS
2.0000 | ORAL_TABLET | Freq: Once | ORAL | Status: AC
  Administered 2020-03-06: 2 via ORAL
  Filled 2020-03-06: qty 2

## 2020-03-06 NOTE — Progress Notes (Signed)
Lower extremity venous LT study completed.  Preliminary results relayed to Criss Alvine, MD.   See CV Proc for preliminary results report.   Jean Rosenthal, RDMS

## 2020-03-06 NOTE — ED Provider Notes (Signed)
[facility]-EMERGENCY DEPT Provider Note   CSN: [id number] Arrival date & time: 03/06/20  1021     History Chief Complaint  Patient presents with  . Leg Swelling    Brenda [name] is a 59 y.o. female.  HPI 59 year old female with 4 days of calf pain and swelling.  She states this started without trauma.  The pain is so bad in her leg that is hard to walk.  She noticed some redness to her leg yesterday that seems to be a little improved today.  She has not had any fevers, chest pain, or shortness of breath.  She recently was diagnosed with a TIA and had a procedure lined up to remove a basilar artery clot that then seem to have resolved on its own.  To her knowledge she has never had a DVT before.  Past Medical History:  Diagnosis Date  . Acute pancreatitis   . Asthma    main dyspnea thought due to deconditioning (10/2013)  . Bipolar I disorder, most recent episode (or current) manic, unspecified    pt denies this  . Bronchitis, chronic obstructive (HCC) 2008 & 2009   FeV1 64% TLC 105% DLCO 55% 2008  -  FeV1 81% FeF 25-75 43% 2009  . COPD (chronic obstructive pulmonary disease) (HCC)    emphysema, not an issue per pulm (10/2013)  . History of pyelonephritis 05/2012  . HTN (hypertension)   . Hx of migraines   . Knee pain, right   . Leukocytosis, unspecified   . Lumbar disc disease with radiculopathy    multilevel spondylitic changes, scoliosis, and anterolisthesis L4 not thought to currently be surg candidate (Dr. Phoebe Perch, [facility]) (Dr. Ollen Bowl, [place])  . Nicotine addiction   . Nonspecific abnormal electrocardiogram (ECG) (EKG)   . Obesity   . Pure hyperglyceridemia   . RSV (respiratory syncytial virus pneumonia) 01/2020   hospitalization  . Suicide attempt (HCC) 06/2001  . Swelling of limb   . Urge and stress incontinence 07/2012   ([facility])    Patient Active Problem List   Diagnosis Date Noted  . Basilar artery stenosis 02/18/2020  . TIA (transient  ischemic attack) 02/08/2020  . Chronic combined systolic and diastolic CHF (congestive heart failure) (HCC) 02/08/2020  . Elevated troponin 02/04/2020  . Chronic kidney disease, stage 3a (HCC) 08/07/2019  . Knee mass, right 11/28/2018  . Advanced care planning/counseling discussion 07/27/2018  . Unilateral primary osteoarthritis, left knee 10/28/2017  . COPD  GOLD II/III still smoking/ 02 dep  10/20/2017  . Cor pulmonale (chronic) (HCC) 09/16/2017  . Diastolic dysfunction 08/26/2017  . Edema 08/04/2017  . Grief 02/24/2017  . Chronic respiratory failure with hypoxia (HCC) 07/31/2015  . Type 2 diabetes, controlled, with peripheral neuropathy (HCC)   . COPD exacerbation (HCC) 09/14/2014  . OSA on CPAP 09/14/2014  . Health maintenance examination 09/14/2014  . Upper airway cough syndrome 09/22/2013  . Chronic pain syndrome 05/30/2013  . Migraine, unspecified, without mention of intractable migraine without mention of status migrainosus 05/30/2013  . Pyelonephritis 05/05/2012  . Asthmatic bronchitis 02/27/2008  . Leukocytosis 02/22/2008  . NONSPECIFIC ABNORMAL ELECTROCARDIOGRAM 02/22/2008  . HYPERTENSION, BENIGN ESSENTIAL 06/23/2007  . HYPERTRIGLYCERIDEMIA 06/01/2007  . Morbid obesity with BMI of 40.0-44.9, adult (HCC) 06/01/2007  . Mood disorder (HCC) 06/01/2007  . Cigarette smoker 06/01/2007  . Left leg swelling 05/04/2007    Past Surgical History:  Procedure Laterality Date  . CHOLECYSTECTOMY  07/1982  . CT MAXILLOFACIAL WO/W CM  06/14/06  Left max mucocele  . CT of chest  06/14/2006   Normal except c/w active inflammation / infection.  Left renal atrophy  . ERCP / sphincterotomy - stenosis  01/15/06  . IR ANGIO INTRA EXTRACRAN SEL COM CAROTID INNOMINATE BILAT MOD SED  02/09/2020  . IR ANGIO VERTEBRAL SEL SUBCLAVIAN INNOMINATE UNI L MOD SED  02/09/2020  . IR ANGIO VERTEBRAL SEL VERTEBRAL UNI R MOD SED  02/09/2020  . IR ANGIO VERTEBRAL SEL VERTEBRAL UNI R MOD SED  02/13/2020  .  IR US GUIDE VASC ACCESS RIGHT  02/09/2020  . IR US GUIDE VASC ACCESS RIGHT  02/13/2020  . lumbar MRI  11/2010   multilevel spondylitic changes with upper lumbar scoliosis and anterolisthesis of L4 on 5 with some L foraminal narrowing esp at L/3 with concavity of scoliosis, some central stenosis and biforaminal narrowing at L4/5 with spondylolisthesis  . [place] - Pneumonia, acute asthma, left maxillary sinusitis  01/11 - 06/18/2006  . RADIOLOGY WITH ANESTHESIA N/A 02/13/2020   Procedure: Carotid Stent;  Surgeon: Julieanne Cotton, MD;  Location: [facility] OR;  Service: Radiology;  Laterality: N/A;  . Retained stone     ERCP secondary to retained stone  . Right knee surgery  1984,1989,1989,1991,1994   Reconstructions for ACL insuff  . TUBAL LIGATION       OB History    Gravida  4   Para  4   Term  4   Preterm      AB      Living  4     SAB      TAB      Ectopic      Multiple      Live Births  4           Family History  Problem Relation Age of Onset  . Heart disease Brother        MI in early 41's  . Hypertension Mother   . Hyperlipidemia Mother   . Heart disease Mother   . Hypertension Father   . Asthma Father   . Breast cancer Paternal Grandmother     Social History   Tobacco Use  . Smoking status: Current Some Day Smoker    Years: 41.00    Types: Cigarettes  . Smokeless tobacco: Never Used  . Tobacco comment: 3 cigs per day 05/29/19 lmr  Vaping Use  . Vaping Use: Never used  Substance Use Topics  . Alcohol use: Yes    Alcohol/week: 0.0 standard drinks    Comment: occasional  . Drug use: No    Home Medications Prior to Admission medications   Medication Sig Start Date End Date Taking? Authorizing Provider  albuterol (PROVENTIL) (2.5 MG/3ML) 0.083% nebulizer solution INHALE 1 VIAL VIA NEBULIZER EVERY 6 HOURS AS NEEDED FOR WHEEZING/SHORTNESS OF BREATH 02/28/20   [name], MD  ARIPiprazole (ABILIFY) 5 MG tablet Take 1 tablet (5 mg total) by  mouth daily. 02/23/20   [name], MD  aspirin EC 81 MG EC tablet Take 1 tablet (81 mg total) by mouth daily. Swallow whole. 02/05/20   Narda Bonds, MD  atorvastatin (LIPITOR) 40 MG tablet Take 1 tablet (40 mg total) by mouth at bedtime. 02/14/20   Osvaldo Shipper, MD  benzonatate (TESSALON) 200 MG capsule Take 1 capsule (200 mg total) by mouth 3 (three) times daily as needed for cough. Swallow whole, to not bite pill 02/23/20   [name], MD  Budeson-Glycopyrrol-Formoterol (BREZTRI AEROSPHERE) 160-9-4.8 MCG/ACT AERO Inhale  2 puffs into the lungs 2 (two) times daily. 05/30/19   Nyoka CowdenWert, Michael B, MD  clopidogrel (PLAVIX) 75 MG tablet Take 1 tablet (75 mg total) by mouth daily. 02/14/20   Osvaldo ShipperKrishnan, Gokul, MD  dextromethorphan-guaiFENesin [facility](MUCINEX DM) 30-600 MG 12hr tablet Take 1 tablet by mouth 2 (two) times daily as needed for cough.    [provider]  escitalopram (LEXAPRO) 20 MG tablet TAKE 1 AND 1/2 TABLETS DAILY BY MOUTH Patient taking differently: Take 30 mg by mouth daily.  01/04/20   Eustaquio BoydenGutierrez, Javier, MD  famotidine (PEPCID) 20 MG tablet TAKE 1 TABLET BY MOUTH EVERYDAY AT BEDTIME 02/27/20   Nyoka CowdenWert, Michael B, MD  fluticasone [facility](FLONASE) 50 MCG/ACT nasal spray Place 1 spray into both nostrils 2 (two) times daily. 05/30/19   Nyoka CowdenWert, Michael B, MD  furosemide (LASIX) 20 MG tablet TAKE 2 TABLETS BY MOUTH EVERY DAY 02/16/20   Nyoka CowdenWert, Michael B, MD  gabapentin (NEURONTIN) 300 MG capsule TAKE 2 TABLETS IN THE MORNING AND 3 TABLETS AT NIGHT 03/01/20   Eustaquio BoydenGutierrez, Javier, MD  HYDROcodone-acetaminophen [facility](NORCO) 5-325 MG tablet Take 1-2 tablets by mouth every 4 (four) hours as needed. 03/06/20   Pricilla LovelessGoldston, Tsuyako Jolley, MD  LORazepam (ATIVAN) 0.5 MG tablet TAKE 1/2 TO 1 TABLET BY MOUTH TWICE A DAY AS NEEDED FOR ANXIETY 02/21/20   Eustaquio BoydenGutierrez, Javier, MD  losartan (COZAAR) 25 MG tablet Take 0.5 tablets (12.5 mg total) by mouth daily. RESUME ON 02/18/20 02/18/20 03/19/20  Osvaldo ShipperKrishnan, Gokul, MD  metFORMIN  (GLUCOPHAGE) 500 MG tablet TAKE 1 TABLET BY MOUTH EVERYDAY AT BEDTIME Patient taking differently: Take 500 mg by mouth daily with breakfast.  10/02/19   Eustaquio BoydenGutierrez, Javier, MD  metoprolol succinate (TOPROL-XL) 25 MG 24 hr tablet Take 0.5 tablets (12.5 mg total) by mouth daily. 02/05/20 03/06/20  Narda BondsNettey, Ralph A, MD  Multiple Vitamin (MULTIVITAMIN WITH MINERALS) TABS tablet Take 1 tablet by mouth daily.    [provider]  nystatin (MYCOSTATIN) 100000 UNIT/ML suspension TAKE 1 TEASPOONFUL BY MOUTH 3 TIMES DAILY AS NEEDED FOR MOUTH SORES. Patient taking differently: Take 5 mLs by mouth 3 (three) times daily as needed (mouth sores).  04/22/18   Parrett, Virgel Bouquetammy S, NP  PROAIR RESPICLICK 108 (90 Base) MCG/ACT AEPB INHALE 2 PUFFS INTO THE LUNGS EVERY 6 HOURS AS NEEDED FOR DYSPNEA/WHEEZE 02/15/20   Eustaquio BoydenGutierrez, Javier, MD  promethazine-dextromethorphan (PROMETHAZINE-DM) 6.25-15 MG/5ML syrup Take 5 mLs by mouth 4 (four) times daily as needed for cough. 02/15/20   Nyoka CowdenWert, Michael B, MD    Allergies    Metolazone  Review of Systems   Review of Systems  Constitutional: Negative for fever.  Respiratory: Negative for shortness of breath.   Cardiovascular: Positive for leg swelling. Negative for chest pain.  Musculoskeletal: Positive for myalgias.  Skin: Positive for color change.  All other systems reviewed and are negative.   Physical Exam Updated Vital Signs BP (!) 142/74 (BP Location: Left Arm)   Pulse 73   Temp 98.6 F (37 C) (Oral)   Resp 20   Ht 5\' 1"  (1.549 m)   Wt 99.8 kg   LMP 03/08/2013   SpO2 100%   BMI 41.57 kg/m   Physical Exam Vitals and nursing note reviewed.  Constitutional:      Appearance: She is well-developed. She is obese.  HENT:     Head: Normocephalic and atraumatic.     Right Ear: External ear normal.     Left Ear: External ear normal.     Nose: Nose  normal.  Eyes:     General:        Right eye: No discharge.        Left eye: No discharge.  Cardiovascular:      Rate and Rhythm: Normal rate and regular rhythm.     Pulses:          Dorsalis pedis pulses are 2+ on the left side.     Heart sounds: Normal heart sounds.  Pulmonary:     Effort: Pulmonary effort is normal.     Breath sounds: Normal breath sounds.  Abdominal:     General: There is no distension.  Musculoskeletal:        General: Swelling and tenderness present.     Comments: No knee swelling/tenderness Left calf with diffuse swelling compared to right. Diffusely tender. No bony tenderness.  Minimal redness to calf. No abscess  Skin:    General: Skin is warm and dry.  Neurological:     Mental Status: She is alert.  Psychiatric:        Mood and Affect: Mood is not anxious.     ED Results / Procedures / Treatments   Labs (all labs ordered are listed, but only abnormal results are displayed) Labs Reviewed  BASIC METABOLIC PANEL - Abnormal; Notable for the following components:      Result Value   Glucose, Bld 102 (*)    All other components within normal limits  CBC - Abnormal; Notable for the following components:   WBC 15.2 (*)    RBC 3.67 (*)    Hemoglobin 11.2 (*)    HCT 34.8 (*)    All other components within normal limits  D-DIMER, QUANTITATIVE (NOT AT [facility]) - Abnormal; Notable for the following components:   D-Dimer, Quant 0.87 (*)    All other components within normal limits    EKG None  Radiology No results found.  Procedures Procedures (including critical care time)  Medications Ordered in ED Medications  HYDROcodone-acetaminophen (NORCO/VICODIN) 5-325 MG per tablet 2 tablet (2 tablets Oral Given 03/06/20 1250)    ED Course  I have reviewed the triage vital signs and the nursing notes.  Pertinent labs & imaging results that were available during my care of the patient were reviewed by me and considered in my medical decision making (see chart for details).    MDM Rules/Calculators/A&P                          Labs obtained in triage have been  reviewed.  She does have a mildly elevated D-dimer.  She has no respiratory or chest symptoms so my suspicion of PE is extremely low.  As for her DVT ultrasound, the ultrasound tech reports no DVT but likely ruptured Baker's cyst.  This would correlate with her symptoms and she has been having a little bit of popliteal pain.  She is neurovascularly intact.  There is no localized or confluent erythema or warmth to suggest infection.  She cannot take NSAIDs.  Will recommend ice and elevation and give a short course of Norco for severe pain.  Follow-up with her PCP/orthopedist as needed. Final Clinical Impression(s) / ED Diagnoses Final diagnoses:  Ruptured Bakers cyst    Rx / DC Orders ED Discharge Orders         Ordered    HYDROcodone-acetaminophen (NORCO) 5-325 MG tablet  Every 4 hours PRN        03/06/20 1314  Pricilla Loveless, MD 03/06/20 1322

## 2020-03-06 NOTE — Discharge Instructions (Signed)
If you develop new or worsening pain or swelling, redness, fever, numbness, weakness, or any other new/concerning symptoms, then return to the ER for evaluation

## 2020-03-06 NOTE — ED Triage Notes (Signed)
Pt started with left leg redness and swelling x2 days. Hx of blood clots, states she recently had a blood clot, followed up with dr and they believed blood clot had moved.

## 2020-03-12 ENCOUNTER — Other Ambulatory Visit: Payer: Self-pay | Admitting: Adult Health

## 2020-03-12 ENCOUNTER — Other Ambulatory Visit: Payer: Self-pay | Admitting: Family Medicine

## 2020-03-13 NOTE — Telephone Encounter (Signed)
ERx 

## 2020-03-14 ENCOUNTER — Ambulatory Visit (INDEPENDENT_AMBULATORY_CARE_PROVIDER_SITE_OTHER): Payer: Medicare Other | Admitting: Physician Assistant

## 2020-03-14 ENCOUNTER — Encounter: Payer: Self-pay | Admitting: Physician Assistant

## 2020-03-14 ENCOUNTER — Other Ambulatory Visit: Payer: Self-pay

## 2020-03-14 VITALS — BP 144/74 | HR 77 | Ht 61.0 in | Wt 226.0 lb

## 2020-03-14 DIAGNOSIS — I519 Heart disease, unspecified: Secondary | ICD-10-CM

## 2020-03-14 DIAGNOSIS — Z8673 Personal history of transient ischemic attack (TIA), and cerebral infarction without residual deficits: Secondary | ICD-10-CM

## 2020-03-14 DIAGNOSIS — E785 Hyperlipidemia, unspecified: Secondary | ICD-10-CM | POA: Diagnosis not present

## 2020-03-14 DIAGNOSIS — J449 Chronic obstructive pulmonary disease, unspecified: Secondary | ICD-10-CM

## 2020-03-14 DIAGNOSIS — I10 Essential (primary) hypertension: Secondary | ICD-10-CM

## 2020-03-14 DIAGNOSIS — I471 Supraventricular tachycardia: Secondary | ICD-10-CM | POA: Diagnosis not present

## 2020-03-14 MED ORDER — METOPROLOL SUCCINATE ER 25 MG PO TB24
25.0000 mg | ORAL_TABLET | Freq: Every day | ORAL | 3 refills | Status: DC
Start: 2020-03-14 — End: 2020-06-10

## 2020-03-14 MED ORDER — LOSARTAN POTASSIUM 25 MG PO TABS: 25.0000 mg | ORAL_TABLET | Freq: Every day | ORAL | 3 refills | Status: DC

## 2020-03-14 NOTE — H&P (View-Only) (Signed)
Cardiology Office Note:    Date:  03/16/2020   ID:  Brenda BarrenJudy L Faulconer, DOB Mar 29, 1961, MRN [id number]  PCP:  Eustaquio BoydenGutierrez, Javier, MD  [facility] Cardiologist:  Reatha HarpsWesley T O'Neal, MD  [facility] Electrophysiologist:  None   Referring MD: Eustaquio BoydenGutierrez, Javier, MD   Chief Complaint  Patient presents with  . Follow-up    seen for Dr. Flora Lipps'Neal    History of Present Illness:    Brenda [name] is a 59 y.o. female with a hx of bipolar 1 disorder, hypertension, COPD emphysema, hyperlipidemia, and obesity.  Patient was recently admitted in early September with acute respiratory failure secondary to COPD exacerbation.  She has ongoing tobacco abuse.  High-sensitivity troponin trended up to greater than 3000.  Cardiology was consulted due to elevated troponin.  Echocardiogram obtained on 01/31/2020 was a difficult study, EF 30 to 35%, grade 2 DD.  EKG showed old anterior infarct but no ischemic changes. Although initially, cardiology service was considering left heart cath once her overall condition improves, however troponin elevation and the echocardiogram suggests this is more likely to be Takotsubo cardiomyopathy.  She also had SVT with right bundle branch block morphology during the hospital as well.  She was started on Toprol-XL.  It was recommended continue medical therapy for LV dysfunction and recheck echocardiogram in 3 months.  If EF continues to be low, then consider additional evaluation.  Patient was released from the hospital on 9/5, however came back to the hospital 3 days later on 02/07/2020 with left-sided weakness, slurred speech, nausea and vomiting concerning for stroke versus TIA.  Stroke team has seen the patient and felt she likely has posterior circulation TIA secondary to basilar artery high-grade stenosis and recommended stenting.  CT of the head was negative for stroke or hemorrhage.  Repeat echocardiogram during the admission showed EF has improved to 40 to 45%.  She eventually underwent  cerebral angiogram with patent distal basilar artery and left PCA P1 with small filling defect of the distal basilar artery, no intervention was performed.  IR recommended follow-up in a CT of the head and neck with contrast in 3 months after discharge.  Dr. Pearlean BrownieSethi of neurology service recommended a TEE and loop recorder placement.  Initially, TEE was scheduled as inpatient, however patient did not wish to stay and wished to get a TEE as outpatient.  Patient presents today for follow-up.  She is running out of the losartan and metoprolol succinate.  Blood pressure is elevated.  I plan will increase losartan to 25 mg daily and increase metoprolol succinate to 25 mg daily as well.  She has been oxygen for the past 2 to 3 years and this has been followed by Dr. Sherene SiresWert of pulmonology service.  She usually uses 2 L/min oxygen during the day and 3 L along with her CPAP machine at night.  She denies any chest pain, she is chronically short of breath.  On exam, she has diffuse wheezing which is likely her baseline.  I recommended follow-up in the next 3 to 4 weeks and was her heart failure medications fully titrated plan to repeat echocardiogram in 3 months.  We discussed neurology recommendation for TEE and loop recorder placement, I will discuss her case with Dr. Flora Lipps'Neal next Monday when he is back to see if he is willing to do his TEE, we can have EP service to see her on the day she arrived for the TEE to set up the loop recorder for the same  afternoon.  Past Medical History:  Diagnosis Date  . Acute pancreatitis   . Asthma    main dyspnea thought due to deconditioning (10/2013)  . Bipolar I disorder, most recent episode (or current) manic, unspecified    pt denies this  . Bronchitis, chronic obstructive (HCC) 2008 & 2009   FeV1 64% TLC 105% DLCO 55% 2008  -  FeV1 81% FeF 25-75 43% 2009  . COPD (chronic obstructive pulmonary disease) (HCC)    emphysema, not an issue per pulm (10/2013)  . History of  pyelonephritis 05/2012  . HTN (hypertension)   . Hx of migraines   . Knee pain, right   . Leukocytosis, unspecified   . Lumbar disc disease with radiculopathy    multilevel spondylitic changes, scoliosis, and anterolisthesis L4 not thought to currently be surg candidate (Dr. Phoebe Perch, [facility]) (Dr. Ollen Bowl, [place])  . Nicotine addiction   . Nonspecific abnormal electrocardiogram (ECG) (EKG)   . Obesity   . Pure hyperglyceridemia   . RSV (respiratory syncytial virus pneumonia) 01/2020   hospitalization  . Suicide attempt (HCC) 06/2001  . Swelling of limb   . Urge and stress incontinence 07/2012   ([facility])    Past Surgical History:  Procedure Laterality Date  . CHOLECYSTECTOMY  07/1982  . CT MAXILLOFACIAL WO/W CM  06/14/06   Left max mucocele  . CT of chest  06/14/2006   Normal except c/w active inflammation / infection.  Left renal atrophy  . ERCP / sphincterotomy - stenosis  01/15/06  . IR ANGIO INTRA EXTRACRAN SEL COM CAROTID INNOMINATE BILAT MOD SED  02/09/2020  . IR ANGIO VERTEBRAL SEL SUBCLAVIAN INNOMINATE UNI L MOD SED  02/09/2020  . IR ANGIO VERTEBRAL SEL VERTEBRAL UNI R MOD SED  02/09/2020  . IR ANGIO VERTEBRAL SEL VERTEBRAL UNI R MOD SED  02/13/2020  . IR US GUIDE VASC ACCESS RIGHT  02/09/2020  . IR US GUIDE VASC ACCESS RIGHT  02/13/2020  . lumbar MRI  11/2010   multilevel spondylitic changes with upper lumbar scoliosis and anterolisthesis of L4 on 5 with some L foraminal narrowing esp at L/3 with concavity of scoliosis, some central stenosis and biforaminal narrowing at L4/5 with spondylolisthesis  . [facility] - Pneumonia, acute asthma, left maxillary sinusitis  01/11 - 06/18/2006  . RADIOLOGY WITH ANESTHESIA N/A 02/13/2020   Procedure: Carotid Stent;  Surgeon: Julieanne Cotton, MD;  Location: [facility] OR;  Service: Radiology;  Laterality: N/A;  . Retained stone     ERCP secondary to retained stone  . Right knee surgery  1984,1989,1989,1991,1994   Reconstructions for ACL insuff    . TUBAL LIGATION      Current Medications: Current Meds  Medication Sig  . albuterol (PROVENTIL) (2.5 MG/3ML) 0.083% nebulizer solution INHALE 1 VIAL VIA NEBULIZER EVERY 6 HOURS AS NEEDED FOR WHEEZING/SHORTNESS OF BREATH  . ARIPiprazole (ABILIFY) 5 MG tablet Take 1 tablet (5 mg total) by mouth daily.  Marland Kitchen aspirin EC 81 MG EC tablet Take 1 tablet (81 mg total) by mouth daily. Swallow whole.  Marland Kitchen atorvastatin (LIPITOR) 40 MG tablet Take 1 tablet (40 mg total) by mouth at bedtime.  . benzonatate (TESSALON) 200 MG capsule Take 1 capsule (200 mg total) by mouth 3 (three) times daily as needed for cough. Swallow whole, to not bite pill  . Budeson-Glycopyrrol-Formoterol (BREZTRI AEROSPHERE) 160-9-4.8 MCG/ACT AERO Inhale 2 puffs into the lungs 2 (two) times daily.  . clopidogrel (PLAVIX) 75 MG tablet Take 1 tablet (75 mg total) by  mouth daily.  Marland Kitchen dextromethorphan-guaiFENesin (MUCINEX DM) 30-600 MG 12hr tablet Take 1 tablet by mouth 2 (two) times daily as needed for cough.  . escitalopram (LEXAPRO) 20 MG tablet TAKE 1 AND 1/2 TABLETS DAILY BY MOUTH (Patient taking differently: Take 30 mg by mouth daily. )  . famotidine (PEPCID) 20 MG tablet TAKE 1 TABLET BY MOUTH EVERYDAY AT BEDTIME  . fluticasone (FLONASE) 50 MCG/ACT nasal spray Place 1 spray into both nostrils 2 (two) times daily.  . furosemide (LASIX) 20 MG tablet TAKE 2 TABLETS BY MOUTH EVERY DAY  . gabapentin (NEURONTIN) 300 MG capsule TAKE 2 TABLETS IN THE MORNING AND 3 TABLETS AT NIGHT  . LORazepam (ATIVAN) 0.5 MG tablet TAKE 1/2 TO 1 TABLET BY MOUTH TWICE A DAY AS NEEDED FOR ANXIETY  . losartan (COZAAR) 25 MG tablet Take 1 tablet (25 mg total) by mouth daily.  . metFORMIN (GLUCOPHAGE) 500 MG tablet TAKE 1 TABLET BY MOUTH EVERYDAY AT BEDTIME (Patient taking differently: Take 500 mg by mouth daily with breakfast. )  . metoprolol succinate (TOPROL-XL) 25 MG 24 hr tablet Take 1 tablet (25 mg total) by mouth daily.  . Multiple Vitamin (MULTIVITAMIN  WITH MINERALS) TABS tablet Take 1 tablet by mouth daily.  Marland Kitchen nystatin (MYCOSTATIN) 100000 UNIT/ML suspension TAKE 1 TEASPOONFUL BY MOUTH 3 TIMES DAILY AS NEEDED FOR MOUTH SORES. (Patient taking differently: Take 5 mLs by mouth 3 (three) times daily as needed (mouth sores). )  . PROAIR RESPICLICK 108 (90 Base) MCG/ACT AEPB INHALE 2 PUFFS INTO THE LUNGS EVERY 6 HOURS AS NEEDED FOR DYSPNEA/WHEEZE  . promethazine-dextromethorphan (PROMETHAZINE-DM) 6.25-15 MG/5ML syrup Take 5 mLs by mouth 4 (four) times daily as needed for cough.  . [DISCONTINUED] losartan (COZAAR) 25 MG tablet Take 0.5 tablets (12.5 mg total) by mouth daily. RESUME ON 02/18/20  . [DISCONTINUED] metoprolol succinate (TOPROL-XL) 25 MG 24 hr tablet Take 0.5 tablets (12.5 mg total) by mouth daily.     Allergies:   Metolazone   Social History   Socioeconomic History  . Marital status: Widowed    Spouse name: Not on file  . Number of children: 3  . Years of education: Not on file  . Highest education level: Not on file  Occupational History  . Occupation: Administrator, arts: [employer]     Employer: [employer]   Tobacco Use  . Smoking status: Current Some Day Smoker    Years: 41.00    Types: Cigarettes  . Smokeless tobacco: Never Used  . Tobacco comment: 3 cigs per day 05/29/19 lmr  Vaping Use  . Vaping Use: Never used  Substance and Sexual Activity  . Alcohol use: Yes    Alcohol/week: 0.0 standard drinks    Comment: occasional  . Drug use: No  . Sexual activity: Not on file  Other Topics Concern  . Not on file  Social History Narrative   Lives with husband and son and daughter in [place] and grandchild   Occupation: Media planner at [employer]   Activity: limited by dyspnea   Diet: good water, fruits/vegetables daily   Social Determinants of Health   Financial Resource Strain:   . Difficulty of Paying Living Expenses: Not on file  Food Insecurity:   . Worried About  Programme researcher, broadcasting/film/video in the Last Year: Not on file  . Ran Out of Food in the Last Year: Not on file  Transportation Needs:   . Lack of Transportation (Medical): Not  on file  . Lack of Transportation (Non-Medical): Not on file  Physical Activity:   . Days of Exercise per Week: Not on file  . Minutes of Exercise per Session: Not on file  Stress:   . Feeling of Stress : Not on file  Social Connections:   . Frequency of Communication with Friends and Family: Not on file  . Frequency of Social Gatherings with Friends and Family: Not on file  . Attends Religious Services: Not on file  . Active Member of Clubs or Organizations: Not on file  . Attends Banker Meetings: Not on file  . Marital Status: Not on file     Family History: The patient's family history includes Asthma in her father; Breast cancer in her paternal grandmother; Heart disease in her brother and mother; Hyperlipidemia in her mother; Hypertension in her father and mother.  ROS:   Please see the history of present illness.     All other systems reviewed and are negative.  EKGs/Labs/Other Studies Reviewed:    The following studies were reviewed today:  Echo 02/08/2020 1. Left ventricular ejection fraction, by estimation, is 40 to 45%. The  left ventricle has mildly decreased function. The left ventricle  demonstrates global hypokinesis. There is mild left ventricular  hypertrophy. Left ventricular diastolic parameters  are indeterminate.  2. Right ventricular systolic function was not well visualized. The right  ventricular size is not well visualized.  3. The mitral valve is grossly normal. No evidence of mitral valve  regurgitation. No evidence of mitral stenosis.  4. The aortic valve is grossly normal. Aortic valve regurgitation is not  visualized. No aortic stenosis is present.  5. Aortic dilatation noted. There is mild dilatation of the ascending  aorta, measuring 39 mm.   EKG:  EKG is not  ordered today.    Recent Labs: 01/30/2020: B Natriuretic Peptide 47.7 02/07/2020: ALT 126 02/08/2020: TSH 0.821 03/06/2020: BUN 13; Creatinine, Ser 0.90; Hemoglobin 11.2; Platelets 319; Potassium 4.1; Sodium 142  Recent Lipid Panel    Component Value Date/Time   CHOL 117 02/09/2020 0214   TRIG 139 02/09/2020 0214   HDL 37 (L) 02/09/2020 0214   CHOLHDL 3.2 02/09/2020 0214   VLDL 28 02/09/2020 0214   LDLCALC 52 02/09/2020 0214     Risk Assessment/Calculations:       Physical Exam:    VS:  BP (!) 144/74   Pulse 77   Ht 5\' 1"  (1.549 m)   Wt 226 lb (102.5 kg)   LMP 03/08/2013   SpO2 98%   BMI 42.70 kg/m     Wt Readings from Last 3 Encounters:  03/14/20 226 lb (102.5 kg)  03/06/20 220 lb (99.8 kg)  02/23/20 226 lb 6 oz (102.7 kg)     GEN:  Well nourished, well developed in no acute distress HEENT: Normal NECK: No JVD; No carotid bruits LYMPHATICS: No lymphadenopathy CARDIAC: RRR, no murmurs, rubs, gallops RESPIRATORY:  Clear to auscultation without rales, wheezing or rhonchi  ABDOMEN: Soft, non-tender, non-distended MUSCULOSKELETAL:  No edema; No deformity  SKIN: Warm and dry NEUROLOGIC:  Alert and oriented x 3 PSYCHIATRIC:  Normal affect   ASSESSMENT:    1. LV dysfunction   2. SVT (supraventricular tachycardia) (HCC)   3. HYPERTENSION, BENIGN ESSENTIAL   4. Hyperlipidemia LDL goal <70   5. Chronic obstructive pulmonary disease, unspecified COPD type (HCC)   6. H/O: CVA (cerebrovascular accident)    PLAN:    In order of problems  listed above:  1. LV dysfunction: With troponin elevation and LV dysfunction seen on echocardiogram, it was initially recommended to proceed with cardiac catheterization, however later was felt that patient likely had Takotsubo cardiomyopathy. Recommended continue to uptitrate heart failure therapy. Patient did have a repeat limited echocardiogram a week later that showed improving ejection fraction.  2. SVT: Well-controlled on  beta-blocker  3. Hypertension: Blood pressure elevated today, will increase losartan and metoprolol succinate to full dose  4. Hyperlipidemia: On Lipitor  5. COPD: She has been on 2 to 3 L home oxygen for the past several years. She is also on CPAP therapy at night. Emphasis has been placed on tobacco cessation.  6. History of CVA: Recently had CVA, neurology service recommended TEE +/-loop recorder. Will discuss with MD, her baseline chronic respiratory failure make general anesthesia less ideal, likely will try sedation.   Medication Adjustments/Labs and Tests Ordered: Current medicines are reviewed at length with the patient today.  Concerns regarding medicines are outlined above.  No orders of the defined types were placed in this encounter.  Meds ordered this encounter  Medications  . metoprolol succinate (TOPROL-XL) 25 MG 24 hr tablet    Sig: Take 1 tablet (25 mg total) by mouth daily.    Dispense:  90 tablet    Refill:  3  . losartan (COZAAR) 25 MG tablet    Sig: Take 1 tablet (25 mg total) by mouth daily.    Dispense:  90 tablet    Refill:  3    Patient Instructions  Medication Instructions:   INCREASE Losartan to 25 mg daily  INCREASE Metoprolol Succinate (Toprol-XL) to 25 mg daily  *If you need a refill on your cardiac medications before your next appointment, please call your pharmacy*  Lab Work: NONE ordered at this time of appointment   If you have labs (blood work) drawn today and your tests are completely normal, you will receive your results only by: Marland Kitchen MyChart Message (if you have MyChart) OR . A paper copy in the mail If you have any lab test that is abnormal or we need to change your treatment, we will call you to review the results.  Testing/Procedures: NONE ordered at this time of appointment   Follow-Up: At [facility], you and your health needs are our priority.  As part of our continuing mission to provide you with exceptional heart care, we  have created designated Provider Care Teams.  These Care Teams include your primary Cardiologist (physician) and Advanced Practice Providers (APPs -  Physician Assistants and Nurse Practitioners) who all work together to provide you with the care you need, when you need it.  Your next appointment:   3-4 week(s)  The format for your next appointment:   Virtual Visit   Provider:   Azalee Course, PA-C  Other Instructions      Signed, Azalee Course, [place]  03/16/2020 11:38 PM    [facility]

## 2020-03-14 NOTE — Patient Instructions (Addendum)
Medication Instructions:   INCREASE Losartan to 25 mg daily  INCREASE Metoprolol Succinate (Toprol-XL) to 25 mg daily  *If you need a refill on your cardiac medications before your next appointment, please call your pharmacy*  Lab Work: NONE ordered at this time of appointment   If you have labs (blood work) drawn today and your tests are completely normal, you will receive your results only by: Marland Kitchen MyChart Message (if you have MyChart) OR . A paper copy in the mail If you have any lab test that is abnormal or we need to change your treatment, we will call you to review the results.  Testing/Procedures: NONE ordered at this time of appointment   Follow-Up: At [facility], you and your health needs are our priority.  As part of our continuing mission to provide you with exceptional heart care, we have created designated Provider Care Teams.  These Care Teams include your primary Cardiologist (physician) and Advanced Practice Providers (APPs -  Physician Assistants and Nurse Practitioners) who all work together to provide you with the care you need, when you need it.  Your next appointment:   3-4 week(s)  The format for your next appointment:   Virtual Visit   Provider:   Azalee Course, PA-C  Other Instructions

## 2020-03-14 NOTE — Progress Notes (Signed)
Cardiology Office Note:    Date:  03/16/2020   ID:  Brenda BarrenJudy L Faulconer, DOB Mar 29, 1961, MRN [id number]  PCP:  Eustaquio BoydenGutierrez, Javier, MD  [facility] Cardiologist:  Reatha HarpsWesley T O'Neal, MD  [facility] Electrophysiologist:  None   Referring MD: Eustaquio BoydenGutierrez, Javier, MD   Chief Complaint  Patient presents with  . Follow-up    seen for Dr. Flora Lipps'Neal    History of Present Illness:    Brenda [name] is a 59 y.o. female with a hx of bipolar 1 disorder, hypertension, COPD emphysema, hyperlipidemia, and obesity.  Patient was recently admitted in early September with acute respiratory failure secondary to COPD exacerbation.  She has ongoing tobacco abuse.  High-sensitivity troponin trended up to greater than 3000.  Cardiology was consulted due to elevated troponin.  Echocardiogram obtained on 01/31/2020 was a difficult study, EF 30 to 35%, grade 2 DD.  EKG showed old anterior infarct but no ischemic changes. Although initially, cardiology service was considering left heart cath once her overall condition improves, however troponin elevation and the echocardiogram suggests this is more likely to be Takotsubo cardiomyopathy.  She also had SVT with right bundle branch block morphology during the hospital as well.  She was started on Toprol-XL.  It was recommended continue medical therapy for LV dysfunction and recheck echocardiogram in 3 months.  If EF continues to be low, then consider additional evaluation.  Patient was released from the hospital on 9/5, however came back to the hospital 3 days later on 02/07/2020 with left-sided weakness, slurred speech, nausea and vomiting concerning for stroke versus TIA.  Stroke team has seen the patient and felt she likely has posterior circulation TIA secondary to basilar artery high-grade stenosis and recommended stenting.  CT of the head was negative for stroke or hemorrhage.  Repeat echocardiogram during the admission showed EF has improved to 40 to 45%.  She eventually underwent  cerebral angiogram with patent distal basilar artery and left PCA P1 with small filling defect of the distal basilar artery, no intervention was performed.  IR recommended follow-up in a CT of the head and neck with contrast in 3 months after discharge.  Dr. Pearlean BrownieSethi of neurology service recommended a TEE and loop recorder placement.  Initially, TEE was scheduled as inpatient, however patient did not wish to stay and wished to get a TEE as outpatient.  Patient presents today for follow-up.  She is running out of the losartan and metoprolol succinate.  Blood pressure is elevated.  I plan will increase losartan to 25 mg daily and increase metoprolol succinate to 25 mg daily as well.  She has been oxygen for the past 2 to 3 years and this has been followed by Dr. Sherene SiresWert of pulmonology service.  She usually uses 2 L/min oxygen during the day and 3 L along with her CPAP machine at night.  She denies any chest pain, she is chronically short of breath.  On exam, she has diffuse wheezing which is likely her baseline.  I recommended follow-up in the next 3 to 4 weeks and was her heart failure medications fully titrated plan to repeat echocardiogram in 3 months.  We discussed neurology recommendation for TEE and loop recorder placement, I will discuss her case with Dr. Flora Lipps'Neal next Monday when he is back to see if he is willing to do his TEE, we can have EP service to see her on the day she arrived for the TEE to set up the loop recorder for the same  afternoon.  Past Medical History:  Diagnosis Date  . Acute pancreatitis   . Asthma    main dyspnea thought due to deconditioning (10/2013)  . Bipolar I disorder, most recent episode (or current) manic, unspecified    pt denies this  . Bronchitis, chronic obstructive (HCC) 2008 & 2009   FeV1 64% TLC 105% DLCO 55% 2008  -  FeV1 81% FeF 25-75 43% 2009  . COPD (chronic obstructive pulmonary disease) (HCC)    emphysema, not an issue per pulm (10/2013)  . History of  pyelonephritis 05/2012  . HTN (hypertension)   . Hx of migraines   . Knee pain, right   . Leukocytosis, unspecified   . Lumbar disc disease with radiculopathy    multilevel spondylitic changes, scoliosis, and anterolisthesis L4 not thought to currently be surg candidate (Dr. Hirsch, [facility]) (Dr. Harkins, [facility])  . Nicotine addiction   . Nonspecific abnormal electrocardiogram (ECG) (EKG)   . Obesity   . Pure hyperglyceridemia   . RSV (respiratory syncytial virus pneumonia) 01/2020   hospitalization  . Suicide attempt (HCC) 06/2001  . Swelling of limb   . Urge and stress incontinence 07/2012   ([facility])    Past Surgical History:  Procedure Laterality Date  . CHOLECYSTECTOMY  07/1982  . CT MAXILLOFACIAL WO/W CM  06/14/06   Left max mucocele  . CT of chest  06/14/2006   Normal except c/w active inflammation / infection.  Left renal atrophy  . ERCP / sphincterotomy - stenosis  01/15/06  . IR ANGIO INTRA EXTRACRAN SEL COM CAROTID INNOMINATE BILAT MOD SED  02/09/2020  . IR ANGIO VERTEBRAL SEL SUBCLAVIAN INNOMINATE UNI L MOD SED  02/09/2020  . IR ANGIO VERTEBRAL SEL VERTEBRAL UNI R MOD SED  02/09/2020  . IR ANGIO VERTEBRAL SEL VERTEBRAL UNI R MOD SED  02/13/2020  . IR US GUIDE VASC ACCESS RIGHT  02/09/2020  . IR US GUIDE VASC ACCESS RIGHT  02/13/2020  . lumbar MRI  11/2010   multilevel spondylitic changes with upper lumbar scoliosis and anterolisthesis of L4 on 5 with some L foraminal narrowing esp at L/3 with concavity of scoliosis, some central stenosis and biforaminal narrowing at L4/5 with spondylolisthesis  . [place] - Pneumonia, acute asthma, left maxillary sinusitis  01/11 - 06/18/2006  . RADIOLOGY WITH ANESTHESIA N/A 02/13/2020   Procedure: Carotid Stent;  Surgeon: Deveshwar, Sanjeev, MD;  Location: [facility] OR;  Service: Radiology;  Laterality: N/A;  . Retained stone     ERCP secondary to retained stone  . Right knee surgery  1984,1989,1989,1991,1994   Reconstructions for ACL insuff    . TUBAL LIGATION      Current Medications: Current Meds  Medication Sig  . albuterol (PROVENTIL) (2.5 MG/3ML) 0.083% nebulizer solution INHALE 1 VIAL VIA NEBULIZER EVERY 6 HOURS AS NEEDED FOR WHEEZING/SHORTNESS OF BREATH  . ARIPiprazole (ABILIFY) 5 MG tablet Take 1 tablet (5 mg total) by mouth daily.  . aspirin EC 81 MG EC tablet Take 1 tablet (81 mg total) by mouth daily. Swallow whole.  . atorvastatin (LIPITOR) 40 MG tablet Take 1 tablet (40 mg total) by mouth at bedtime.  . benzonatate (TESSALON) 200 MG capsule Take 1 capsule (200 mg total) by mouth 3 (three) times daily as needed for cough. Swallow whole, to not bite pill  . Budeson-Glycopyrrol-Formoterol (BREZTRI AEROSPHERE) 160-9-4.8 MCG/ACT AERO Inhale 2 puffs into the lungs 2 (two) times daily.  . clopidogrel (PLAVIX) 75 MG tablet Take 1 tablet (75 mg total) by   mouth daily.  Marland Kitchen dextromethorphan-guaiFENesin (MUCINEX DM) 30-600 MG 12hr tablet Take 1 tablet by mouth 2 (two) times daily as needed for cough.  . escitalopram (LEXAPRO) 20 MG tablet TAKE 1 AND 1/2 TABLETS DAILY BY MOUTH (Patient taking differently: Take 30 mg by mouth daily. )  . famotidine (PEPCID) 20 MG tablet TAKE 1 TABLET BY MOUTH EVERYDAY AT BEDTIME  . fluticasone (FLONASE) 50 MCG/ACT nasal spray Place 1 spray into both nostrils 2 (two) times daily.  . furosemide (LASIX) 20 MG tablet TAKE 2 TABLETS BY MOUTH EVERY DAY  . gabapentin (NEURONTIN) 300 MG capsule TAKE 2 TABLETS IN THE MORNING AND 3 TABLETS AT NIGHT  . LORazepam (ATIVAN) 0.5 MG tablet TAKE 1/2 TO 1 TABLET BY MOUTH TWICE A DAY AS NEEDED FOR ANXIETY  . losartan (COZAAR) 25 MG tablet Take 1 tablet (25 mg total) by mouth daily.  . metFORMIN (GLUCOPHAGE) 500 MG tablet TAKE 1 TABLET BY MOUTH EVERYDAY AT BEDTIME (Patient taking differently: Take 500 mg by mouth daily with breakfast. )  . metoprolol succinate (TOPROL-XL) 25 MG 24 hr tablet Take 1 tablet (25 mg total) by mouth daily.  . Multiple Vitamin (MULTIVITAMIN  WITH MINERALS) TABS tablet Take 1 tablet by mouth daily.  Marland Kitchen nystatin (MYCOSTATIN) 100000 UNIT/ML suspension TAKE 1 TEASPOONFUL BY MOUTH 3 TIMES DAILY AS NEEDED FOR MOUTH SORES. (Patient taking differently: Take 5 mLs by mouth 3 (three) times daily as needed (mouth sores). )  . PROAIR RESPICLICK 108 (90 Base) MCG/ACT AEPB INHALE 2 PUFFS INTO THE LUNGS EVERY 6 HOURS AS NEEDED FOR DYSPNEA/WHEEZE  . promethazine-dextromethorphan (PROMETHAZINE-DM) 6.25-15 MG/5ML syrup Take 5 mLs by mouth 4 (four) times daily as needed for cough.  . [DISCONTINUED] losartan (COZAAR) 25 MG tablet Take 0.5 tablets (12.5 mg total) by mouth daily. RESUME ON 02/18/20  . [DISCONTINUED] metoprolol succinate (TOPROL-XL) 25 MG 24 hr tablet Take 0.5 tablets (12.5 mg total) by mouth daily.     Allergies:   Metolazone   Social History   Socioeconomic History  . Marital status: Widowed    Spouse name: Not on file  . Number of children: 3  . Years of education: Not on file  . Highest education level: Not on file  Occupational History  . Occupation: Administrator, arts: [employer]     Employer: [employer]   Tobacco Use  . Smoking status: Current Some Day Smoker    Years: 41.00    Types: Cigarettes  . Smokeless tobacco: Never Used  . Tobacco comment: 3 cigs per day 05/29/19 lmr  Vaping Use  . Vaping Use: Never used  Substance and Sexual Activity  . Alcohol use: Yes    Alcohol/week: 0.0 standard drinks    Comment: occasional  . Drug use: No  . Sexual activity: Not on file  Other Topics Concern  . Not on file  Social History Narrative   Lives with husband and son and daughter in [place] and grandchild   Occupation: Media planner at [employer]   Activity: limited by dyspnea   Diet: good water, fruits/vegetables daily   Social Determinants of Health   Financial Resource Strain:   . Difficulty of Paying Living Expenses: Not on file  Food Insecurity:   . Worried About  Programme researcher, broadcasting/film/video in the Last Year: Not on file  . Ran Out of Food in the Last Year: Not on file  Transportation Needs:   . Lack of Transportation (Medical): Not  on file  . Lack of Transportation (Non-Medical): Not on file  Physical Activity:   . Days of Exercise per Week: Not on file  . Minutes of Exercise per Session: Not on file  Stress:   . Feeling of Stress : Not on file  Social Connections:   . Frequency of Communication with Friends and Family: Not on file  . Frequency of Social Gatherings with Friends and Family: Not on file  . Attends Religious Services: Not on file  . Active Member of Clubs or Organizations: Not on file  . Attends Banker Meetings: Not on file  . Marital Status: Not on file     Family History: The patient's family history includes Asthma in her father; Breast cancer in her paternal grandmother; Heart disease in her brother and mother; Hyperlipidemia in her mother; Hypertension in her father and mother.  ROS:   Please see the history of present illness.     All other systems reviewed and are negative.  EKGs/Labs/Other Studies Reviewed:    The following studies were reviewed today:  Echo 02/08/2020 1. Left ventricular ejection fraction, by estimation, is 40 to 45%. The  left ventricle has mildly decreased function. The left ventricle  demonstrates global hypokinesis. There is mild left ventricular  hypertrophy. Left ventricular diastolic parameters  are indeterminate.  2. Right ventricular systolic function was not well visualized. The right  ventricular size is not well visualized.  3. The mitral valve is grossly normal. No evidence of mitral valve  regurgitation. No evidence of mitral stenosis.  4. The aortic valve is grossly normal. Aortic valve regurgitation is not  visualized. No aortic stenosis is present.  5. Aortic dilatation noted. There is mild dilatation of the ascending  aorta, measuring 39 mm.   EKG:  EKG is not  ordered today.    Recent Labs: 01/30/2020: B Natriuretic Peptide 47.7 02/07/2020: ALT 126 02/08/2020: TSH 0.821 03/06/2020: BUN 13; Creatinine, Ser 0.90; Hemoglobin 11.2; Platelets 319; Potassium 4.1; Sodium 142  Recent Lipid Panel    Component Value Date/Time   CHOL 117 02/09/2020 0214   TRIG 139 02/09/2020 0214   HDL 37 (L) 02/09/2020 0214   CHOLHDL 3.2 02/09/2020 0214   VLDL 28 02/09/2020 0214   LDLCALC 52 02/09/2020 0214     Risk Assessment/Calculations:       Physical Exam:    VS:  BP (!) 144/74   Pulse 77   Ht 5\' 1"  (1.549 m)   Wt 226 lb (102.5 kg)   LMP 03/08/2013   SpO2 98%   BMI 42.70 kg/m     Wt Readings from Last 3 Encounters:  03/14/20 226 lb (102.5 kg)  03/06/20 220 lb (99.8 kg)  02/23/20 226 lb 6 oz (102.7 kg)     GEN:  Well nourished, well developed in no acute distress HEENT: Normal NECK: No JVD; No carotid bruits LYMPHATICS: No lymphadenopathy CARDIAC: RRR, no murmurs, rubs, gallops RESPIRATORY:  Clear to auscultation without rales, wheezing or rhonchi  ABDOMEN: Soft, non-tender, non-distended MUSCULOSKELETAL:  No edema; No deformity  SKIN: Warm and dry NEUROLOGIC:  Alert and oriented x 3 PSYCHIATRIC:  Normal affect   ASSESSMENT:    1. LV dysfunction   2. SVT (supraventricular tachycardia) (HCC)   3. HYPERTENSION, BENIGN ESSENTIAL   4. Hyperlipidemia LDL goal <70   5. Chronic obstructive pulmonary disease, unspecified COPD type (HCC)   6. H/O: CVA (cerebrovascular accident)    PLAN:    In order of problems  listed above:  1. LV dysfunction: With troponin elevation and LV dysfunction seen on echocardiogram, it was initially recommended to proceed with cardiac catheterization, however later was felt that patient likely had Takotsubo cardiomyopathy. Recommended continue to uptitrate heart failure therapy. Patient did have a repeat limited echocardiogram a week later that showed improving ejection fraction.  2. SVT: Well-controlled on  beta-blocker  3. Hypertension: Blood pressure elevated today, will increase losartan and metoprolol succinate to full dose  4. Hyperlipidemia: On Lipitor  5. COPD: She has been on 2 to 3 L home oxygen for the past several years. She is also on CPAP therapy at night. Emphasis has been placed on tobacco cessation.  6. History of CVA: Recently had CVA, neurology service recommended TEE +/-loop recorder. Will discuss with MD, her baseline chronic respiratory failure make general anesthesia less ideal, likely will try sedation.   Medication Adjustments/Labs and Tests Ordered: Current medicines are reviewed at length with the patient today.  Concerns regarding medicines are outlined above.  No orders of the defined types were placed in this encounter.  Meds ordered this encounter  Medications  . metoprolol succinate (TOPROL-XL) 25 MG 24 hr tablet    Sig: Take 1 tablet (25 mg total) by mouth daily.    Dispense:  90 tablet    Refill:  3  . losartan (COZAAR) 25 MG tablet    Sig: Take 1 tablet (25 mg total) by mouth daily.    Dispense:  90 tablet    Refill:  3    Patient Instructions  Medication Instructions:   INCREASE Losartan to 25 mg daily  INCREASE Metoprolol Succinate (Toprol-XL) to 25 mg daily  *If you need a refill on your cardiac medications before your next appointment, please call your pharmacy*  Lab Work: NONE ordered at this time of appointment   If you have labs (blood work) drawn today and your tests are completely normal, you will receive your results only by: Marland Kitchen MyChart Message (if you have MyChart) OR . A paper copy in the mail If you have any lab test that is abnormal or we need to change your treatment, we will call you to review the results.  Testing/Procedures: NONE ordered at this time of appointment   Follow-Up: At [facility], you and your health needs are our priority.  As part of our continuing mission to provide you with exceptional heart care, we  have created designated Provider Care Teams.  These Care Teams include your primary Cardiologist (physician) and Advanced Practice Providers (APPs -  Physician Assistants and Nurse Practitioners) who all work together to provide you with the care you need, when you need it.  Your next appointment:   3-4 week(s)  The format for your next appointment:   Virtual Visit   Provider:   Azalee Course, PA-C  Other Instructions      Signed, Azalee Course, [place]  03/16/2020 11:38 PM    [facility]

## 2020-03-15 ENCOUNTER — Telehealth: Payer: Self-pay

## 2020-03-15 NOTE — Telephone Encounter (Signed)
Received faxed [employer] [employer] request for prior approval CMN/PA.  Placed form in Dr. Timoteo Expose box.

## 2020-03-15 NOTE — Telephone Encounter (Signed)
Filled and in Lisa's box 

## 2020-03-16 ENCOUNTER — Other Ambulatory Visit: Payer: Self-pay | Admitting: Family Medicine

## 2020-03-16 ENCOUNTER — Encounter: Payer: Self-pay | Admitting: Physician Assistant

## 2020-03-18 ENCOUNTER — Telehealth: Payer: Self-pay | Admitting: Physician Assistant

## 2020-03-18 ENCOUNTER — Other Ambulatory Visit: Payer: Self-pay | Admitting: Cardiovascular Disease

## 2020-03-18 MED ORDER — LOSARTAN POTASSIUM 25 MG PO TABS
25.0000 mg | ORAL_TABLET | Freq: Every day | ORAL | 2 refills | Status: DC
Start: 2020-03-18 — End: 2020-04-09

## 2020-03-18 NOTE — Telephone Encounter (Signed)
Received request for 90 day supply of : Abilify 5 mg LR 02/23/20, #30, 3 rfs LOV 02/23/20 FOV  None scheduled  Please review and advise.  Thanks. Dm/cma

## 2020-03-18 NOTE — Telephone Encounter (Signed)
*  STAT* If patient is at the pharmacy, call can be transferred to refill team.   1. Which medications need to be refilled? (please list name of each medication and dose if known) losartan (COZAAR) 25 MG tablet  2. Which pharmacy/location (including street and city if local pharmacy) is medication to be sent to? [id number] [place], [place] - [address]  3. Do they need a 30 day or 90 day supply? [id number]   Pharmacy states they never got this script.

## 2020-03-18 NOTE — Telephone Encounter (Signed)
I have called and discussed with the patient and Dr. Flora Lipps, we plan to set her up for outpatient TEE +/- loop recorder. We plan to request EP team to see the patient on the day she comes in for TEE and discuss possible recorder if the TEE is negative. TEE +/- Loop is requested by Dr. Pearlean Brownie of neurology service. See discharge note from 9/15. Risk and benefit of the procedure has been discussed with the patient who is agreeable to proceed.

## 2020-03-19 NOTE — Telephone Encounter (Signed)
Left message for pt to call, TEE is scheduled for 04-02-20 @ 11:30 am and the loop recorder is scheduled for 2 pm same day if needed. Patient will need to be scheduled for covid testing friday 10/29. Dr Elease Hashimoto will be doing there TEE and dr taylor the loop. Pre-cert done and orders to be done by hao meng pa-c.

## 2020-03-19 NOTE — Telephone Encounter (Signed)
Faxed form.

## 2020-03-20 ENCOUNTER — Telehealth: Payer: Self-pay | Admitting: Cardiovascular Disease

## 2020-03-20 NOTE — Telephone Encounter (Signed)
Pt spoke with Azalee Course PA at her last office visit and she has decided to go ahead with her TEE and Loop recorder as suggested by Dr. Pearlean Brownie.  The procedure has been set up already and pt verbalized understanding of her instructions and will call if she has any further questions.   Letter sent to her My Chart for her review.

## 2020-03-20 NOTE — Telephone Encounter (Signed)
New message:    Patient calling stating that she is ready for the EET and loop recorder. Please call patient.

## 2020-03-21 ENCOUNTER — Other Ambulatory Visit: Payer: Self-pay | Admitting: Physician Assistant

## 2020-03-21 NOTE — Telephone Encounter (Signed)
Hi: Brenda [name]. Brenda [name] will be going for TEE on 04/02/2020 as requested by Dr. Pearlean Brownie for recent TIA. Dr. Pearlean Brownie also recommendation possible loop recorder if TEE is negative. Can EP see her on the day she is going for TEE to explain and consent her for loop? Loop is currently scheduled for the same afternoon, however I did not place orders as I wasn't sure which order sets to place. Thanks

## 2020-03-22 NOTE — Telephone Encounter (Signed)
And as long as her EF remains recovered, otherwise would be potential ICD candidate, of course.

## 2020-03-22 NOTE — Telephone Encounter (Signed)
Yeah! We can see her that day as long as we are able to consent her BEFORE her TEE.

## 2020-03-28 ENCOUNTER — Other Ambulatory Visit: Payer: Self-pay | Admitting: Family Medicine

## 2020-03-29 ENCOUNTER — Other Ambulatory Visit (HOSPITAL_COMMUNITY)
Admission: RE | Admit: 2020-03-29 | Discharge: 2020-03-29 | Disposition: A | Discharge status: Home / Self Care | Payer: Medicare Other | Source: Ambulatory Visit | Attending: Cardiovascular Disease | Admitting: Cardiovascular Disease

## 2020-03-29 DIAGNOSIS — Z01812 Encounter for preprocedural laboratory examination: Secondary | ICD-10-CM | POA: Insufficient documentation

## 2020-03-29 DIAGNOSIS — Z20822 Contact with and (suspected) exposure to covid-19: Secondary | ICD-10-CM | POA: Diagnosis not present

## 2020-03-30 LAB — SARS CORONAVIRUS 2 (TAT 6-24 HRS): SARS Coronavirus 2: NEGATIVE

## 2020-04-01 ENCOUNTER — Other Ambulatory Visit: Payer: Self-pay | Admitting: Physician Assistant

## 2020-04-01 NOTE — Telephone Encounter (Signed)
Thank you. She is scheduled for 11/2

## 2020-04-02 ENCOUNTER — Other Ambulatory Visit: Payer: Self-pay

## 2020-04-02 ENCOUNTER — Ambulatory Visit (HOSPITAL_COMMUNITY): Payer: Medicare Other | Admitting: Anesthesiology

## 2020-04-02 ENCOUNTER — Encounter (HOSPITAL_COMMUNITY)
Admission: RE | Disposition: A | Discharge status: Home / Self Care | Payer: Self-pay | Source: Home / Self Care | Attending: Cardiovascular Disease

## 2020-04-02 ENCOUNTER — Ambulatory Visit (HOSPITAL_COMMUNITY)
Admission: RE | Admit: 2020-04-02 | Discharge: 2020-04-02 | Disposition: A | Discharge status: Home / Self Care | Payer: Medicare Other | Attending: Cardiovascular Disease | Admitting: Cardiovascular Disease

## 2020-04-02 ENCOUNTER — Ambulatory Visit (HOSPITAL_BASED_OUTPATIENT_CLINIC_OR_DEPARTMENT_OTHER): Payer: Medicare Other

## 2020-04-02 ENCOUNTER — Encounter (HOSPITAL_COMMUNITY): Payer: Self-pay | Admitting: Cardiovascular Disease

## 2020-04-02 DIAGNOSIS — G459 Transient cerebral ischemic attack, unspecified: Secondary | ICD-10-CM | POA: Diagnosis not present

## 2020-04-02 DIAGNOSIS — I10 Essential (primary) hypertension: Secondary | ICD-10-CM | POA: Insufficient documentation

## 2020-04-02 DIAGNOSIS — I639 Cerebral infarction, unspecified: Secondary | ICD-10-CM | POA: Insufficient documentation

## 2020-04-02 DIAGNOSIS — I471 Supraventricular tachycardia: Secondary | ICD-10-CM | POA: Insufficient documentation

## 2020-04-02 DIAGNOSIS — J449 Chronic obstructive pulmonary disease, unspecified: Secondary | ICD-10-CM | POA: Diagnosis not present

## 2020-04-02 DIAGNOSIS — I340 Nonrheumatic mitral (valve) insufficiency: Secondary | ICD-10-CM

## 2020-04-02 DIAGNOSIS — E785 Hyperlipidemia, unspecified: Secondary | ICD-10-CM | POA: Diagnosis not present

## 2020-04-02 DIAGNOSIS — F17210 Nicotine dependence, cigarettes, uncomplicated: Secondary | ICD-10-CM | POA: Insufficient documentation

## 2020-04-02 DIAGNOSIS — I130 Hypertensive heart and chronic kidney disease with heart failure and stage 1 through stage 4 chronic kidney disease, or unspecified chronic kidney disease: Secondary | ICD-10-CM | POA: Diagnosis not present

## 2020-04-02 DIAGNOSIS — N1831 Chronic kidney disease, stage 3a: Secondary | ICD-10-CM | POA: Diagnosis not present

## 2020-04-02 DIAGNOSIS — I5042 Chronic combined systolic (congestive) and diastolic (congestive) heart failure: Secondary | ICD-10-CM | POA: Diagnosis not present

## 2020-04-02 HISTORY — PX: LOOP RECORDER INSERTION: EP1214

## 2020-04-02 HISTORY — PX: BUBBLE STUDY: SHX6837

## 2020-04-02 HISTORY — PX: TEE WITHOUT CARDIOVERSION: SHX5443

## 2020-04-02 LAB — GLUCOSE, CAPILLARY: Glucose-Capillary: 96 mg/dL (ref 70–99)

## 2020-04-02 SURGERY — ECHOCARDIOGRAM, TRANSESOPHAGEAL
Anesthesia: Monitor Anesthesia Care

## 2020-04-02 SURGERY — LOOP RECORDER INSERTION

## 2020-04-02 MED ORDER — PROPOFOL 500 MG/50ML IV EMUL
INTRAVENOUS | Status: DC | PRN
  Administered 2020-04-02: 50 ug/kg/min via INTRAVENOUS

## 2020-04-02 MED ORDER — LIDOCAINE-EPINEPHRINE 1 %-1:100000 IJ SOLN
INTRAMUSCULAR | Status: DC | PRN
  Administered 2020-04-02: 30 mL

## 2020-04-02 MED ORDER — BUTAMBEN-TETRACAINE-BENZOCAINE 2-2-14 % EX AERO
INHALATION_SPRAY | CUTANEOUS | Status: DC | PRN
  Administered 2020-04-02: 2 via TOPICAL

## 2020-04-02 MED ORDER — LIDOCAINE-EPINEPHRINE 1 %-1:100000 IJ SOLN
INTRAMUSCULAR | Status: AC
  Filled 2020-04-02: qty 1

## 2020-04-02 MED ORDER — SODIUM CHLORIDE 0.9 % IV SOLN: INTRAVENOUS | Status: DC

## 2020-04-02 MED ORDER — BUTAMBEN-TETRACAINE-BENZOCAINE 2-2-14 % EX AERO
INHALATION_SPRAY | CUTANEOUS | Status: AC
  Filled 2020-04-02: qty 20

## 2020-04-02 MED ORDER — LIDOCAINE HCL (CARDIAC) PF 100 MG/5ML IV SOSY
PREFILLED_SYRINGE | INTRAVENOUS | Status: DC | PRN
  Administered 2020-04-02: 40 mg via INTRAVENOUS

## 2020-04-02 SURGICAL SUPPLY — 2 items
MONITOR REVEAL LINQ II (Prosthesis & Implant Heart) ×2 IMPLANT
PACK LOOP INSERTION (CUSTOM PROCEDURE TRAY) ×2 IMPLANT

## 2020-04-02 NOTE — Transfer of Care (Signed)
Immediate Anesthesia Transfer of Care Note  Patient: Brenda [name]  Procedure(s) Performed: TRANSESOPHAGEAL ECHOCARDIOGRAM (TEE) (N/A ) BUBBLE STUDY  Patient Location: Endoscopy Unit  Anesthesia Type:MAC  Level of Consciousness: awake, alert  and oriented  Airway & Oxygen Therapy: Patient connected to nasal cannula oxygen  Post-op Assessment: Post -op Vital signs reviewed and stable  Post vital signs: stable  Last Vitals:  Vitals Value Taken Time  BP    Temp    Pulse    Resp    SpO2      Last Pain:  Vitals:   04/02/20 1041  PainSc: 0-No pain         Complications: No complications documented.

## 2020-04-02 NOTE — Interval H&P Note (Signed)
History and Physical Interval Note:  04/02/2020 10:26 AM  [name]  has presented today for surgery, with the diagnosis of STROKE.  The various methods of treatment have been discussed with the patient and family. After consideration of risks, benefits and other options for treatment, the patient has consented to  Procedure(s): TRANSESOPHAGEAL ECHOCARDIOGRAM (TEE) (N/A) as a surgical intervention.  The patient's history has been reviewed, patient examined, no change in status, stable for surgery.  I have reviewed the patient's chart and labs.  Questions were answered to the patient's satisfaction.     Kristeen Miss

## 2020-04-02 NOTE — Progress Notes (Signed)
Echocardiogram Echocardiogram Transesophageal has been performed.  Brenda [name] Brenda [name] 04/02/2020, 11:33 AM

## 2020-04-02 NOTE — CV Procedure (Signed)
    Transesophageal Echocardiogram Note  Brenda [name] [id number] 1960/06/23  Procedure: Transesophageal Echocardiogram Indications: TIA   Procedure Details Consent: Obtained Time Out: Verified patient identification, verified procedure, site/side was marked, verified correct patient position, special equipment/implants available, Radiology Safety Procedures followed,  medications/allergies/relevent history reviewed, required imaging and test results available.  Performed  Medications:  During this procedure the patient is administered Lidocaine 40 mg IV followed by propofol drip - by CRNA Jeanice Lim .  Total of 104 mg propofol given during procedure  sedation.  The patient's heart rate, blood pressure, and oxygen saturation are monitored continuously during the procedure. The period of conscious sedation is  30  minutes, of which I was present face-to-face 100% of this time.  Left Ventrical:  Moderately reduced LV function .   EF 35%.   Mitral Valve: mild MR   Aortic Valve: normal, 3 leaflet valve , no AI or AS   Tricuspid Valve:  Trace TR   Pulmonic Valve:  No PI   Left Atrium/ Left atrial appendage: no LAA thrombi seen    Atrial septum: no evidence of ASD or PFO by color doppler or bubble study   Aorta: normal .    Complications: No apparent complications Patient did tolerate procedure well.   Vesta Mixer, Montez Hageman., MD, [facility] 04/02/2020, 11:27 AM

## 2020-04-02 NOTE — Anesthesia Preprocedure Evaluation (Addendum)
Anesthesia Evaluation  Patient identified by MRN, date of birth, ID band Patient awake    Reviewed: Patient's Chart, lab work & pertinent test results  Airway Mallampati: II  TM Distance: >3 FB Neck ROM: Full    Dental  (+) Edentulous Upper   Pulmonary asthma , sleep apnea (3L at night with CPAP), Continuous Positive Airway Pressure Ventilation and Oxygen sleep apnea , COPD (2L during day),  oxygen dependent, Current Smoker and Patient abstained from smoking.,     (-) decreased breath sounds      Cardiovascular Exercise Tolerance: Good hypertension, +CHF   Rhythm:Regular Rate:Normal  Echo 02/08/2020 1. Left ventricular ejection fraction, by estimation, is 40 to 45%. The left ventricle has mildly decreased function. The left ventricle demonstrates global hypokinesis. There is mild left ventricular hypertrophy. Left ventricular diastolic parameters are indeterminate.  2. Right ventricular systolic function was not well visualized. The right ventricular size is not well visualized.  3. The mitral valve is grossly normal. No evidence of mitral valve regurgitation. No evidence of mitral stenosis.  4. The aortic valve is grossly normal. Aortic valve regurgitation is not visualized. No aortic stenosis is present.  5. Aortic dilatation noted. There is mild dilatation of the ascending aorta, measuring 39 mm.    Neuro/Psych  Headaches, PSYCHIATRIC DISORDERS Bipolar Disorder TIACVA    GI/Hepatic negative GI ROS, Neg liver ROS,   Endo/Other  diabetesMorbid obesity  Renal/GU Renal disease  negative genitourinary   Musculoskeletal  (+) Arthritis , Osteoarthritis,  multilevel spondylitic changes, scoliosis, and anterolisthesis L4 not thought to currently be surg candidate    Abdominal (+) + obese,   Peds negative pediatric ROS (+)  Hematology negative hematology ROS (+)   Anesthesia Other Findings   Reproductive/Obstetrics                             Anesthesia Physical  Anesthesia Plan  ASA: III  Anesthesia Plan: MAC   Post-op Pain Management:    Induction: Intravenous  PONV Risk Score and Plan: 1 and Treatment may vary due to age or medical condition, Propofol infusion and TIVA  Airway Management Planned: Natural Airway and Nasal Cannula  Additional Equipment:   Intra-op Plan:   Post-operative Plan:   Informed Consent: I have reviewed the patients History and Physical, chart, labs and discussed the procedure including the risks, benefits and alternatives for the proposed anesthesia with the patient or authorized representative who has indicated his/her understanding and acceptance.       Plan Discussed with: CRNA and Surgeon  Anesthesia Plan Comments:         Anesthesia Quick Evaluation

## 2020-04-02 NOTE — Interval H&P Note (Signed)
History and Physical Interval Note:  04/02/2020 1:42 PM  Brenda [name]  has presented today for surgery, with the diagnosis of stroke.  The various methods of treatment have been discussed with the patient and family. After consideration of risks, benefits and other options for treatment, the patient has consented to  Procedure(s): LOOP RECORDER INSERTION (N/A) as a surgical intervention.  The patient's history has been reviewed, patient examined, no change in status, stable for surgery.  I have reviewed the patient's chart and labs.  Questions were answered to the patient's satisfaction.     Lewayne Bunting

## 2020-04-02 NOTE — H&P (View-Only) (Signed)
ELECTROPHYSIOLOGY CONSULT NOTE  Patient ID: Brenda [name] MRN: [id number], DOB/AGE: Mar 30, 1961   Admit date: 04/02/2020 Date of Consult: 04/02/2020  Primary Physician: [name], MD Primary Cardiologist: Reatha Harps, MD  Primary Electrophysiologist: New to Dr. Ladona Ridgel Reason for Consultation: Cryptogenic stroke; recommendations regarding Implantable Loop Recorder Insurance: [facility] Medicare  History of Present Illness EP has been asked to evaluate Brenda [name] for placement of an implantable loop recorder to monitor for atrial fibrillation by Dr Pearlean Brownie. The patient was admitted on 02/07/2020 with left-sided weakness, nausea, vomiting, slurred speech, and double vision. No tPA given due to time and resolution of symptoms. Imaging demonstrated distal basilar artery high grade stenosis that was thought to have lead to a posterior circulation TIA.  They have undergone workup for stroke including echocardiogram and CTA Head and Neck.  The patient has been monitored on telemetry which has demonstrated sinus rhythm with no arrhythmias per reports.    TEE was deferred. Echo showed EF 40-45%, which was a slightly improvement from previous at 30-35%.  She has been followed by [facility] for med titration for her cardiomyopathy. They had planned on considering LHC once she recovered more from her original admission, but this was deferred due to improved EF and echo more consistent with stress cardiomyopathy. Neurology recommended TEE which is to be performed today.   Prior to admission, the patient denies chest pain, shortness of breath, dizziness, palpitations, or syncope.  They are recovering from their stroke with plans to return home  at discharge.  Past Medical History:  Diagnosis Date  . Acute pancreatitis   . Asthma    main dyspnea thought due to deconditioning (10/2013)  . Bipolar I disorder, most recent episode (or current) manic, unspecified    pt denies this  . Bronchitis, chronic  obstructive (HCC) 2008 & 2009   FeV1 64% TLC 105% DLCO 55% 2008  -  FeV1 81% FeF 25-75 43% 2009  . COPD (chronic obstructive pulmonary disease) (HCC)    emphysema, not an issue per pulm (10/2013)  . History of pyelonephritis 05/2012  . HTN (hypertension)   . Hx of migraines   . Knee pain, right   . Leukocytosis, unspecified   . Lumbar disc disease with radiculopathy    multilevel spondylitic changes, scoliosis, and anterolisthesis L4 not thought to currently be surg candidate (Dr. Phoebe Perch, [facility]) (Dr. Ollen Bowl, [place])  . Nicotine addiction   . Nonspecific abnormal electrocardiogram (ECG) (EKG)   . Obesity   . Pure hyperglyceridemia   . RSV (respiratory syncytial virus pneumonia) 01/2020   hospitalization  . Suicide attempt (HCC) 06/2001  . Swelling of limb   . Urge and stress incontinence 07/2012   ([facility])     Surgical History:  Past Surgical History:  Procedure Laterality Date  . CHOLECYSTECTOMY  07/1982  . CT MAXILLOFACIAL WO/W CM  06/14/06   Left max mucocele  . CT of chest  06/14/2006   Normal except c/w active inflammation / infection.  Left renal atrophy  . ERCP / sphincterotomy - stenosis  01/15/06  . IR ANGIO INTRA EXTRACRAN SEL COM CAROTID INNOMINATE BILAT MOD SED  02/09/2020  . IR ANGIO VERTEBRAL SEL SUBCLAVIAN INNOMINATE UNI L MOD SED  02/09/2020  . IR ANGIO VERTEBRAL SEL VERTEBRAL UNI R MOD SED  02/09/2020  . IR ANGIO VERTEBRAL SEL VERTEBRAL UNI R MOD SED  02/13/2020  . IR US GUIDE VASC ACCESS RIGHT  02/09/2020  . IR US GUIDE VASC ACCESS RIGHT  02/13/2020  . lumbar MRI  11/2010   multilevel spondylitic changes with upper lumbar scoliosis and anterolisthesis of L4 on 5 with some L foraminal narrowing esp at L/3 with concavity of scoliosis, some central stenosis and biforaminal narrowing at L4/5 with spondylolisthesis  . [place] - Pneumonia, acute asthma, left maxillary sinusitis  01/11 - 06/18/2006  . RADIOLOGY WITH ANESTHESIA N/A 02/13/2020   Procedure: Carotid  Stent;  Surgeon: Deveshwar, Sanjeev, MD;  Location: [facility] OR;  Service: Radiology;  Laterality: N/A;  . Retained stone     ERCP secondary to retained stone  . Right knee surgery  1984,1989,1989,1991,1994   Reconstructions for ACL insuff  . TUBAL LIGATION       Medications Prior to Admission  Medication Sig Dispense Refill Last Dose  . ARIPiprazole (ABILIFY) 5 MG tablet TAKE 1 TABLET BY MOUTH EVERY DAY (Patient taking differently: Take 5 mg by mouth daily. ) 90 tablet 1   . aspirin EC 81 MG EC tablet Take 1 tablet (81 mg total) by mouth daily. Swallow whole. 30 tablet 0   . atorvastatin (LIPITOR) 40 MG tablet Take 1 tablet (40 mg total) by mouth at bedtime. 30 tablet 2   . benzonatate (TESSALON) 200 MG capsule Take 1 capsule (200 mg total) by mouth 3 (three) times daily as needed for cough. Swallow whole, to not bite pill 45 capsule 2   . Budeson-Glycopyrrol-Formoterol (BREZTRI AEROSPHERE) 160-9-4.8 MCG/ACT AERO Inhale 2 puffs into the lungs 2 (two) times daily. 10.7 g 11   . cetirizine (ZYRTEC) 10 MG tablet Take 10 mg by mouth at bedtime.     . clopidogrel (PLAVIX) 75 MG tablet Take 1 tablet (75 mg total) by mouth daily. 30 tablet 3   . dextromethorphan-guaiFENesin (MUCINEX DM) 30-600 MG 12hr tablet Take 1 tablet by mouth 2 (two) times daily as needed for cough.     . escitalopram (LEXAPRO) 20 MG tablet TAKE 1 AND 1/2 TABLETS DAILY BY MOUTH (Patient taking differently: Take 30 mg by mouth daily. ) 135 tablet 2   . famotidine (PEPCID) 20 MG tablet TAKE 1 TABLET BY MOUTH EVERYDAY AT BEDTIME (Patient taking differently: Take 20 mg by mouth at bedtime. ) 90 tablet 2   . fluticasone (FLONASE) 50 MCG/ACT nasal spray Place 1 spray into both nostrils 2 (two) times daily. (Patient taking differently: Place 2 sprays into both nostrils daily. ) 16 g 11   . furosemide (LASIX) 20 MG tablet TAKE 2 TABLETS BY MOUTH EVERY DAY (Patient taking differently: Take 20 mg by mouth 2 (two) times daily. ) 60 tablet 2   .  gabapentin (NEURONTIN) 300 MG capsule TAKE 2 TABLETS IN THE MORNING AND 3 TABLETS AT NIGHT (Patient taking differently: Take 600-900 mg by mouth See admin instructions. TAKE 2 TABLETS (600 MG) BY MOUTH IN THE MORNING AND 3 TABLETS (900 MG) BY MOUTH AT NIGHT) 450 capsule 1   . LORazepam (ATIVAN) 0.5 MG tablet TAKE 1/2 TO 1 TABLET BY MOUTH TWICE A DAY AS NEEDED FOR ANXIETY (Patient taking differently: Take 0.25-0.5 mg by mouth 2 (two) times daily as needed for anxiety. ) 30 tablet 0   . losartan (COZAAR) 25 MG tablet Take 1 tablet (25 mg total) by mouth daily. 90 tablet 2   . metFORMIN (GLUCOPHAGE) 500 MG tablet TAKE 1 TABLET BY MOUTH EVERYDAY AT BEDTIME (Patient taking differently: Take 500 mg by mouth daily with breakfast. ) 90 tablet 3   . metoprolol succinate (TOPROL-XL) 25 MG 24   hr tablet Take 1 tablet (25 mg total) by mouth daily. 90 tablet 3   . Multiple Vitamin (MULTIVITAMIN WITH MINERALS) TABS tablet Take 1 tablet by mouth daily.     Marland Kitchen nystatin (MYCOSTATIN) 100000 UNIT/ML suspension TAKE 1 TEASPOONFUL BY MOUTH 3 TIMES DAILY AS NEEDED FOR MOUTH SORES. (Patient taking differently: Take 5 mLs by mouth 3 (three) times daily as needed (mouth sores). ) 150 mL 1   . PROAIR RESPICLICK 108 (90 Base) MCG/ACT AEPB INHALE 2 PUFFS INTO THE LUNGS EVERY 6 HOURS AS NEEDED FOR DYSPNEA/WHEEZE (Patient taking differently: Inhale 2 puffs into the lungs every 6 (six) hours as needed (dyspnea/wheezing.). ) 2 each 3   . promethazine-dextromethorphan (PROMETHAZINE-DM) 6.25-15 MG/5ML syrup Take 5 mLs by mouth 4 (four) times daily as needed for cough. 240 mL 0   . albuterol (PROVENTIL) (2.5 MG/3ML) 0.083% nebulizer solution INHALE 1 VIAL VIA NEBULIZER EVERY 6 HOURS AS NEEDED FOR WHEEZING/SHORTNESS OF BREATH 150 mL 1     Inpatient Medications:   Allergies:  Allergies  Allergen Reactions  . Metolazone Other (See Comments)    Metabolic mood swings    Social History   Socioeconomic History  . Marital status:  Widowed    Spouse name: Not on file  . Number of children: 3  . Years of education: Not on file  . Highest education level: Not on file  Occupational History  . Occupation: Administrator, arts: [employer]     Employer: [employer]   Tobacco Use  . Smoking status: Current Some Day Smoker    Years: 41.00    Types: Cigarettes  . Smokeless tobacco: Never Used  . Tobacco comment: 3 cigs per day 05/29/19 lmr  Vaping Use  . Vaping Use: Never used  Substance and Sexual Activity  . Alcohol use: Yes    Alcohol/week: 0.0 standard drinks    Comment: occasional  . Drug use: No  . Sexual activity: Not on file  Other Topics Concern  . Not on file  Social History Narrative   Lives with husband and son and daughter in Social [place] and grandchild   Occupation: Media planner at [employer]   Activity: limited by dyspnea   Diet: good water, fruits/vegetables daily   Social Determinants of Health   Financial Resource Strain:   . Difficulty of Paying Living Expenses: Not on file  Food Insecurity:   . Worried About Programme researcher, broadcasting/film/video in the Last Year: Not on file  . Ran Out of Food in the Last Year: Not on file  Transportation Needs:   . Lack of Transportation (Medical): Not on file  . Lack of Transportation (Non-Medical): Not on file  Physical Activity:   . Days of Exercise per Week: Not on file  . Minutes of Exercise per Session: Not on file  Stress:   . Feeling of Stress : Not on file  Social Connections:   . Frequency of Communication with Friends and Family: Not on file  . Frequency of Social Gatherings with Friends and Family: Not on file  . Attends Religious Services: Not on file  . Active Member of Clubs or Organizations: Not on file  . Attends Banker Meetings: Not on file  . Marital Status: Not on file  Intimate Partner Violence:   . Fear of Current or Ex-Partner: Not on file  . Emotionally Abused: Not on file  . Physically  Abused: Not on file  . Sexually Abused:  Not on file     Family History  Problem Relation Age of Onset  . Heart disease Brother        MI in early 66's  . Hypertension Mother   . Hyperlipidemia Mother   . Heart disease Mother   . Hypertension Father   . Asthma Father   . Breast cancer Paternal Grandmother       Review of Systems: All other systems reviewed and are otherwise negative except as noted above.  Physical Exam: There were no vitals filed for this visit.  GEN- The patient is well appearing, alert and oriented x 3 today.   Head- normocephalic, atraumatic Eyes-  Sclera clear, conjunctiva pink Ears- hearing intact Oropharynx- clear Neck- supple Lungs- Clear to ausculation bilaterally, normal work of breathing Heart- Regular rate and rhythm, no murmurs, rubs or gallops  GI- soft, NT, ND, + BS Extremities- no clubbing, cyanosis, or edema MS- no significant deformity or atrophy Skin- no rash or lesion Psych- euthymic mood, full affect   Labs:   Lab Results  Component Value Date   WBC 15.2 (H) 03/06/2020   HGB 11.2 (L) 03/06/2020   HCT 34.8 (L) 03/06/2020   MCV 94.8 03/06/2020   PLT 319 03/06/2020   No results for input(s): NA, K, CL, CO2, BUN, CREATININE, CALCIUM, PROT, BILITOT, ALKPHOS, ALT, AST, GLUCOSE in the last 168 hours.  Invalid input(s): LABALBU   Radiology/Studies: VAS [place] LOWER EXTREMITY VENOUS (DVT) (ONLY [facility] & [facility] 7a-7p)  Result Date: 03/06/2020  Lower Venous DVTStudy Indications: Swelling, Pain, and Erythema.  Limitations: Body habitus. Comparison Study: 05-30-2019 LT lower venous study available for comparison. Performing Technologist: Jean Rosenthal  Examination Guidelines: A complete evaluation includes B-mode imaging, spectral Doppler, color Doppler, and power Doppler as needed of all accessible portions of each vessel. Bilateral testing is considered an integral part of a complete examination. Limited examinations for reoccurring indications may be  performed as noted. The reflux portion of the exam is performed with the patient in reverse Trendelenburg.  +-----+---------------+---------+-----------+----------+--------------+ RIGHTCompressibilityPhasicitySpontaneityPropertiesThrombus Aging +-----+---------------+---------+-----------+----------+--------------+ CFV  Full           Yes      Yes                                 +-----+---------------+---------+-----------+----------+--------------+   +---------+---------------+---------+-----------+----------+---------------+ LEFT     CompressibilityPhasicitySpontaneityPropertiesThrombus Aging  +---------+---------------+---------+-----------+----------+---------------+ CFV      Full           Yes      Yes                                  +---------+---------------+---------+-----------+----------+---------------+ SFJ      Full                                                         +---------+---------------+---------+-----------+----------+---------------+ FV Prox  Full                                                         +---------+---------------+---------+-----------+----------+---------------+  FV Mid   Full                                                         +---------+---------------+---------+-----------+----------+---------------+ FV DistalFull                                                         +---------+---------------+---------+-----------+----------+---------------+ PFV      Full                                                         +---------+---------------+---------+-----------+----------+---------------+ POP      Full           Yes      Yes                                  +---------+---------------+---------+-----------+----------+---------------+ PTV                                                   Patent by color +---------+---------------+---------+-----------+----------+---------------+ PERO                                                   Patent by color +---------+---------------+---------+-----------+----------+---------------+     Summary: RIGHT: - No evidence of common femoral vein obstruction.  LEFT: - There is no evidence of deep vein thrombosis in the lower extremity. However, portions of this examination were limited- see technologist comments above.  - A cystic structure is found in the popliteal fossa. - Ultrasound characteristics of a ruptured Baker's Cyst are noted.  *See table(s) above for measurements and observations. Electronically signed by Coral Else MD on 03/06/2020 at 9:03:10 PM.    Final     12-lead ECG 02/07/2020 shows NSR at 77 bpm (personally reviewed) All prior EKG's in EPIC reviewed with no documented atrial fibrillation  EKG 01/31/2020 showed WCT in 150s, per chart, was reviewed with EP at the time and thought SVT.   Echo 02/08/2020 LVEF 40-45% LA not well visualized but measured ~3.4 cm.  Telemetry Not applied (personally reviewed)  Assessment and Plan:  1. Cryptogenic stroke/TIA The patient presents with cryptogenic stroke.  The patient does have a TEE planned for this AM.  I spoke at length with the patient about monitoring for afib with an implantable loop recorder.  Risks, benefits, and alteratives to implantable loop recorder were discussed with the patient today.   At this time, the patient is very clear in their decision to proceed with implantable loop recorder if not contraindicated by TEE results.   2. SVT/AT?  Will review these EKGs with Dr. Ladona Ridgel.  Wound care was reviewed  with the patient (keep incision clean and dry for 3 days).  Wound check scheduled and entered in AVS. Please call with questions.   Graciella FreerMichael Andrew Tillery, PA-C 04/02/2020 10:29 AM  EP Attending  Patient seen and examined. Agree with abovel. The patient has had a cryptogenic stroke and has improved. She has had an a TEE which demonstrated an EF of 35-40%, and no obvious  cause of her stroke. I have reviewed the indications/risks/benefits/goals/expectations and he wishes to proceed.   Sharlot GowdaGregg Reno Clasby,MD

## 2020-04-02 NOTE — Anesthesia Postprocedure Evaluation (Signed)
Anesthesia Post Note  Patient: Brenda [name]  Procedure(s) Performed: TRANSESOPHAGEAL ECHOCARDIOGRAM (TEE) (N/A ) BUBBLE STUDY     Patient location during evaluation: PACU Anesthesia Type: MAC Level of consciousness: awake and alert Pain management: pain level controlled Vital Signs Assessment: post-procedure vital signs reviewed and stable Respiratory status: spontaneous breathing and respiratory function stable Cardiovascular status: stable Postop Assessment: no apparent nausea or vomiting Anesthetic complications: no   No complications documented.  Last Vitals:  Vitals:   04/02/20 1210 04/02/20 1215  BP:  130/65  Pulse: 69 64  Resp: 19 17  Temp:    SpO2: 99% 100%    Last Pain:  Vitals:   04/02/20 1215  TempSrc:   PainSc: 0-No pain                 Merlinda Frederick

## 2020-04-02 NOTE — Discharge Instructions (Signed)
  LOOP RECORDER DISCHARGE INSTRUCTION SHEET GIVEN    TEE  YOU HAD AN CARDIAC PROCEDURE TODAY: Refer to the procedure report and other information in the discharge instructions given to you for any specific questions about what was found during the examination. If this information does not answer your questions, please call [facility] office at [phone] to clarify.   DIET: Your first meal following the procedure should be a light meal and then it is ok to progress to your normal diet. A half-sandwich or bowl of soup is an example of a good first meal. Heavy or fried foods are harder to digest and may make you feel nauseous or bloated. Drink plenty of fluids but you should avoid alcoholic beverages for 24 hours. If you had a esophageal dilation, please see attached instructions for diet.   ACTIVITY: Your care partner should take you home directly after the procedure. You should plan to take it easy, moving slowly for the rest of the day. You can resume normal activity the day after the procedure however YOU SHOULD NOT DRIVE, use power tools, machinery or perform tasks that involve climbing or major physical exertion for 24 hours (because of the sedation medicines used during the test).   SYMPTOMS TO REPORT IMMEDIATELY: A cardiologist can be reached at any hour. Please call [phone] for any of the following symptoms:  Vomiting of blood or coffee ground material  New, significant abdominal pain  New, significant chest pain or pain under the shoulder blades  Painful or persistently difficult swallowing  New shortness of breath  Black, tarry-looking or red, bloody stools  FOLLOW UP:  Please also call with any specific questions about appointments or follow up tests.  Care After Your Loop Recorder  . You have a Medtronic Loop Recorder   . Monitor your cardiac device site for redness, swelling, and drainage. Call the device clinic at [phone] if you experience these symptoms or  fever/chills.  . You may shower 3 days after your loop recorder implant and wash your incision with soap and water. Avoid lotions, ointments, or perfumes over your incision until it is well-healed.  . You may use a hot tub or a pool AFTER your wound check appointment if the incision is completely closed.  . Your device is MRI compatible.   . Remote monitoring is used to monitor your cardiac device from home. This monitoring is scheduled every month days by our office. It allows [place] to keep an eye on the functioning of your device to ensure it is working properly.

## 2020-04-02 NOTE — Consult Note (Addendum)
ELECTROPHYSIOLOGY CONSULT NOTE  Patient ID: Brenda [name] MRN: [id number], DOB/AGE: Mar 30, 1961   Admit date: 04/02/2020 Date of Consult: 04/02/2020  Primary Physician: [name], MD Primary Cardiologist: Reatha Harps, MD  Primary Electrophysiologist: New to Dr. Ladona Ridgel Reason for Consultation: Cryptogenic stroke; recommendations regarding Implantable Loop Recorder Insurance: [facility] Medicare  History of Present Illness EP has been asked to evaluate Brenda [name] for placement of an implantable loop recorder to monitor for atrial fibrillation by Dr Pearlean Brownie. The patient was admitted on 02/07/2020 with left-sided weakness, nausea, vomiting, slurred speech, and double vision. No tPA given due to time and resolution of symptoms. Imaging demonstrated distal basilar artery high grade stenosis that was thought to have lead to a posterior circulation TIA.  They have undergone workup for stroke including echocardiogram and CTA Head and Neck.  The patient has been monitored on telemetry which has demonstrated sinus rhythm with no arrhythmias per reports.    TEE was deferred. Echo showed EF 40-45%, which was a slightly improvement from previous at 30-35%.  She has been followed by [facility] for med titration for her cardiomyopathy. They had planned on considering LHC once she recovered more from her original admission, but this was deferred due to improved EF and echo more consistent with stress cardiomyopathy. Neurology recommended TEE which is to be performed today.   Prior to admission, the patient denies chest pain, shortness of breath, dizziness, palpitations, or syncope.  They are recovering from their stroke with plans to return home  at discharge.  Past Medical History:  Diagnosis Date  . Acute pancreatitis   . Asthma    main dyspnea thought due to deconditioning (10/2013)  . Bipolar I disorder, most recent episode (or current) manic, unspecified    pt denies this  . Bronchitis, chronic  obstructive (HCC) 2008 & 2009   FeV1 64% TLC 105% DLCO 55% 2008  -  FeV1 81% FeF 25-75 43% 2009  . COPD (chronic obstructive pulmonary disease) (HCC)    emphysema, not an issue per pulm (10/2013)  . History of pyelonephritis 05/2012  . HTN (hypertension)   . Hx of migraines   . Knee pain, right   . Leukocytosis, unspecified   . Lumbar disc disease with radiculopathy    multilevel spondylitic changes, scoliosis, and anterolisthesis L4 not thought to currently be surg candidate (Dr. Phoebe Perch, [facility]) (Dr. Ollen Bowl, [place])  . Nicotine addiction   . Nonspecific abnormal electrocardiogram (ECG) (EKG)   . Obesity   . Pure hyperglyceridemia   . RSV (respiratory syncytial virus pneumonia) 01/2020   hospitalization  . Suicide attempt (HCC) 06/2001  . Swelling of limb   . Urge and stress incontinence 07/2012   ([facility])     Surgical History:  Past Surgical History:  Procedure Laterality Date  . CHOLECYSTECTOMY  07/1982  . CT MAXILLOFACIAL WO/W CM  06/14/06   Left max mucocele  . CT of chest  06/14/2006   Normal except c/w active inflammation / infection.  Left renal atrophy  . ERCP / sphincterotomy - stenosis  01/15/06  . IR ANGIO INTRA EXTRACRAN SEL COM CAROTID INNOMINATE BILAT MOD SED  02/09/2020  . IR ANGIO VERTEBRAL SEL SUBCLAVIAN INNOMINATE UNI L MOD SED  02/09/2020  . IR ANGIO VERTEBRAL SEL VERTEBRAL UNI R MOD SED  02/09/2020  . IR ANGIO VERTEBRAL SEL VERTEBRAL UNI R MOD SED  02/13/2020  . IR US GUIDE VASC ACCESS RIGHT  02/09/2020  . IR US GUIDE VASC ACCESS RIGHT  02/13/2020  . lumbar MRI  11/2010   multilevel spondylitic changes with upper lumbar scoliosis and anterolisthesis of L4 on 5 with some L foraminal narrowing esp at L/3 with concavity of scoliosis, some central stenosis and biforaminal narrowing at L4/5 with spondylolisthesis  . [place] - Pneumonia, acute asthma, left maxillary sinusitis  01/11 - 06/18/2006  . RADIOLOGY WITH ANESTHESIA N/A 02/13/2020   Procedure: Carotid  Stent;  Surgeon: Julieanne Cotton, MD;  Location: [facility] OR;  Service: Radiology;  Laterality: N/A;  . Retained stone     ERCP secondary to retained stone  . Right knee surgery  1984,1989,1989,1991,1994   Reconstructions for ACL insuff  . TUBAL LIGATION       Medications Prior to Admission  Medication Sig Dispense Refill Last Dose  . ARIPiprazole (ABILIFY) 5 MG tablet TAKE 1 TABLET BY MOUTH EVERY DAY (Patient taking differently: Take 5 mg by mouth daily. ) 90 tablet 1   . aspirin EC 81 MG EC tablet Take 1 tablet (81 mg total) by mouth daily. Swallow whole. 30 tablet 0   . atorvastatin (LIPITOR) 40 MG tablet Take 1 tablet (40 mg total) by mouth at bedtime. 30 tablet 2   . benzonatate (TESSALON) 200 MG capsule Take 1 capsule (200 mg total) by mouth 3 (three) times daily as needed for cough. Swallow whole, to not bite pill 45 capsule 2   . Budeson-Glycopyrrol-Formoterol (BREZTRI AEROSPHERE) 160-9-4.8 MCG/ACT AERO Inhale 2 puffs into the lungs 2 (two) times daily. 10.7 g 11   . cetirizine (ZYRTEC) 10 MG tablet Take 10 mg by mouth at bedtime.     . clopidogrel (PLAVIX) 75 MG tablet Take 1 tablet (75 mg total) by mouth daily. 30 tablet 3   . dextromethorphan-guaiFENesin (MUCINEX DM) 30-600 MG 12hr tablet Take 1 tablet by mouth 2 (two) times daily as needed for cough.     . escitalopram (LEXAPRO) 20 MG tablet TAKE 1 AND 1/2 TABLETS DAILY BY MOUTH (Patient taking differently: Take 30 mg by mouth daily. ) 135 tablet 2   . famotidine (PEPCID) 20 MG tablet TAKE 1 TABLET BY MOUTH EVERYDAY AT BEDTIME (Patient taking differently: Take 20 mg by mouth at bedtime. ) 90 tablet 2   . fluticasone (FLONASE) 50 MCG/ACT nasal spray Place 1 spray into both nostrils 2 (two) times daily. (Patient taking differently: Place 2 sprays into both nostrils daily. ) 16 g 11   . furosemide (LASIX) 20 MG tablet TAKE 2 TABLETS BY MOUTH EVERY DAY (Patient taking differently: Take 20 mg by mouth 2 (two) times daily. ) 60 tablet 2   .  gabapentin (NEURONTIN) 300 MG capsule TAKE 2 TABLETS IN THE MORNING AND 3 TABLETS AT NIGHT (Patient taking differently: Take 600-900 mg by mouth See admin instructions. TAKE 2 TABLETS (600 MG) BY MOUTH IN THE MORNING AND 3 TABLETS (900 MG) BY MOUTH AT NIGHT) 450 capsule 1   . LORazepam (ATIVAN) 0.5 MG tablet TAKE 1/2 TO 1 TABLET BY MOUTH TWICE A DAY AS NEEDED FOR ANXIETY (Patient taking differently: Take 0.25-0.5 mg by mouth 2 (two) times daily as needed for anxiety. ) 30 tablet 0   . losartan (COZAAR) 25 MG tablet Take 1 tablet (25 mg total) by mouth daily. 90 tablet 2   . metFORMIN (GLUCOPHAGE) 500 MG tablet TAKE 1 TABLET BY MOUTH EVERYDAY AT BEDTIME (Patient taking differently: Take 500 mg by mouth daily with breakfast. ) 90 tablet 3   . metoprolol succinate (TOPROL-XL) 25 MG 24  hr tablet Take 1 tablet (25 mg total) by mouth daily. 90 tablet 3   . Multiple Vitamin (MULTIVITAMIN WITH MINERALS) TABS tablet Take 1 tablet by mouth daily.     Marland Kitchen nystatin (MYCOSTATIN) 100000 UNIT/ML suspension TAKE 1 TEASPOONFUL BY MOUTH 3 TIMES DAILY AS NEEDED FOR MOUTH SORES. (Patient taking differently: Take 5 mLs by mouth 3 (three) times daily as needed (mouth sores). ) 150 mL 1   . PROAIR RESPICLICK 108 (90 Base) MCG/ACT AEPB INHALE 2 PUFFS INTO THE LUNGS EVERY 6 HOURS AS NEEDED FOR DYSPNEA/WHEEZE (Patient taking differently: Inhale 2 puffs into the lungs every 6 (six) hours as needed (dyspnea/wheezing.). ) 2 each 3   . promethazine-dextromethorphan (PROMETHAZINE-DM) 6.25-15 MG/5ML syrup Take 5 mLs by mouth 4 (four) times daily as needed for cough. 240 mL 0   . albuterol (PROVENTIL) (2.5 MG/3ML) 0.083% nebulizer solution INHALE 1 VIAL VIA NEBULIZER EVERY 6 HOURS AS NEEDED FOR WHEEZING/SHORTNESS OF BREATH 150 mL 1     Inpatient Medications:   Allergies:  Allergies  Allergen Reactions  . Metolazone Other (See Comments)    Metabolic mood swings    Social History   Socioeconomic History  . Marital status:  Widowed    Spouse name: Not on file  . Number of children: 3  . Years of education: Not on file  . Highest education level: Not on file  Occupational History  . Occupation: Administrator, arts: [employer]     Employer: [employer]   Tobacco Use  . Smoking status: Current Some Day Smoker    Years: 41.00    Types: Cigarettes  . Smokeless tobacco: Never Used  . Tobacco comment: 3 cigs per day 05/29/19 lmr  Vaping Use  . Vaping Use: Never used  Substance and Sexual Activity  . Alcohol use: Yes    Alcohol/week: 0.0 standard drinks    Comment: occasional  . Drug use: No  . Sexual activity: Not on file  Other Topics Concern  . Not on file  Social History Narrative   Lives with husband and son and daughter in [place] and grandchild   Occupation: Media planner at [employer]   Activity: limited by dyspnea   Diet: good water, fruits/vegetables daily   Social Determinants of Health   Financial Resource Strain:   . Difficulty of Paying Living Expenses: Not on file  Food Insecurity:   . Worried About Programme researcher, broadcasting/film/video in the Last Year: Not on file  . Ran Out of Food in the Last Year: Not on file  Transportation Needs:   . Lack of Transportation (Medical): Not on file  . Lack of Transportation (Non-Medical): Not on file  Physical Activity:   . Days of Exercise per Week: Not on file  . Minutes of Exercise per Session: Not on file  Stress:   . Feeling of Stress : Not on file  Social Connections:   . Frequency of Communication with Friends and Family: Not on file  . Frequency of Social Gatherings with Friends and Family: Not on file  . Attends Religious Services: Not on file  . Active Member of Clubs or Organizations: Not on file  . Attends Banker Meetings: Not on file  . Marital Status: Not on file  Intimate Partner Violence:   . Fear of Current or Ex-Partner: Not on file  . Emotionally Abused: Not on file  . Physically  Abused: Not on file  . Sexually Abused:  Not on file     Family History  Problem Relation Age of Onset  . Heart disease Brother        MI in early 66's  . Hypertension Mother   . Hyperlipidemia Mother   . Heart disease Mother   . Hypertension Father   . Asthma Father   . Breast cancer Paternal Grandmother       Review of Systems: All other systems reviewed and are otherwise negative except as noted above.  Physical Exam: There were no vitals filed for this visit.  GEN- The patient is well appearing, alert and oriented x 3 today.   Head- normocephalic, atraumatic Eyes-  Sclera clear, conjunctiva pink Ears- hearing intact Oropharynx- clear Neck- supple Lungs- Clear to ausculation bilaterally, normal work of breathing Heart- Regular rate and rhythm, no murmurs, rubs or gallops  GI- soft, NT, ND, + BS Extremities- no clubbing, cyanosis, or edema MS- no significant deformity or atrophy Skin- no rash or lesion Psych- euthymic mood, full affect   Labs:   Lab Results  Component Value Date   WBC 15.2 (H) 03/06/2020   HGB 11.2 (L) 03/06/2020   HCT 34.8 (L) 03/06/2020   MCV 94.8 03/06/2020   PLT 319 03/06/2020   No results for input(s): NA, K, CL, CO2, BUN, CREATININE, CALCIUM, PROT, BILITOT, ALKPHOS, ALT, AST, GLUCOSE in the last 168 hours.  Invalid input(s): LABALBU   Radiology/Studies: VAS [place] LOWER EXTREMITY VENOUS (DVT) (ONLY [facility] & [facility] 7a-7p)  Result Date: 03/06/2020  Lower Venous DVTStudy Indications: Swelling, Pain, and Erythema.  Limitations: Body habitus. Comparison Study: 05-30-2019 LT lower venous study available for comparison. Performing Technologist: Jean Rosenthal  Examination Guidelines: A complete evaluation includes B-mode imaging, spectral Doppler, color Doppler, and power Doppler as needed of all accessible portions of each vessel. Bilateral testing is considered an integral part of a complete examination. Limited examinations for reoccurring indications may be  performed as noted. The reflux portion of the exam is performed with the patient in reverse Trendelenburg.  +-----+---------------+---------+-----------+----------+--------------+ RIGHTCompressibilityPhasicitySpontaneityPropertiesThrombus Aging +-----+---------------+---------+-----------+----------+--------------+ CFV  Full           Yes      Yes                                 +-----+---------------+---------+-----------+----------+--------------+   +---------+---------------+---------+-----------+----------+---------------+ LEFT     CompressibilityPhasicitySpontaneityPropertiesThrombus Aging  +---------+---------------+---------+-----------+----------+---------------+ CFV      Full           Yes      Yes                                  +---------+---------------+---------+-----------+----------+---------------+ SFJ      Full                                                         +---------+---------------+---------+-----------+----------+---------------+ FV Prox  Full                                                         +---------+---------------+---------+-----------+----------+---------------+  FV Mid   Full                                                         +---------+---------------+---------+-----------+----------+---------------+ FV DistalFull                                                         +---------+---------------+---------+-----------+----------+---------------+ PFV      Full                                                         +---------+---------------+---------+-----------+----------+---------------+ POP      Full           Yes      Yes                                  +---------+---------------+---------+-----------+----------+---------------+ PTV                                                   Patent by color +---------+---------------+---------+-----------+----------+---------------+ PERO                                                   Patent by color +---------+---------------+---------+-----------+----------+---------------+     Summary: RIGHT: - No evidence of common femoral vein obstruction.  LEFT: - There is no evidence of deep vein thrombosis in the lower extremity. However, portions of this examination were limited- see technologist comments above.  - A cystic structure is found in the popliteal fossa. - Ultrasound characteristics of a ruptured Baker's Cyst are noted.  *See table(s) above for measurements and observations. Electronically signed by Coral Else MD on 03/06/2020 at 9:03:10 PM.    Final     12-lead ECG 02/07/2020 shows NSR at 77 bpm (personally reviewed) All prior EKG's in EPIC reviewed with no documented atrial fibrillation  EKG 01/31/2020 showed WCT in 150s, per chart, was reviewed with EP at the time and thought SVT.   Echo 02/08/2020 LVEF 40-45% LA not well visualized but measured ~3.4 cm.  Telemetry Not applied (personally reviewed)  Assessment and Plan:  1. Cryptogenic stroke/TIA The patient presents with cryptogenic stroke.  The patient does have a TEE planned for this AM.  I spoke at length with the patient about monitoring for afib with an implantable loop recorder.  Risks, benefits, and alteratives to implantable loop recorder were discussed with the patient today.   At this time, the patient is very clear in their decision to proceed with implantable loop recorder if not contraindicated by TEE results.   2. SVT/AT?  Will review these EKGs with Dr. Ladona Ridgel.  Wound care was reviewed  with the patient (keep incision clean and dry for 3 days).  Wound check scheduled and entered in AVS. Please call with questions.   Graciella FreerMichael Andrew Tillery, PA-C 04/02/2020 10:29 AM  EP Attending  Patient seen and examined. Agree with abovel. The patient has had a cryptogenic stroke and has improved. She has had an a TEE which demonstrated an EF of 35-40%, and no obvious  cause of her stroke. I have reviewed the indications/risks/benefits/goals/expectations and he wishes to proceed.   Sharlot GowdaGregg Regie Bunner,MD

## 2020-04-03 ENCOUNTER — Encounter (HOSPITAL_COMMUNITY): Payer: Self-pay | Admitting: Internal Medicine

## 2020-04-04 ENCOUNTER — Encounter (HOSPITAL_COMMUNITY): Payer: Self-pay | Admitting: Cardiovascular Disease

## 2020-04-08 ENCOUNTER — Telehealth: Payer: Self-pay

## 2020-04-08 NOTE — Telephone Encounter (Signed)
Noted  

## 2020-04-08 NOTE — Telephone Encounter (Signed)
Brenda [name] with [employer] left v/m about received form for nebulizer order and date of service was 02/09/2020; Brenda [name] had the date of onset as marked thru and could not read new Date of onset. Resending form to Dr Reece Agar to sign and date form. Fax # will be on cover sheet.

## 2020-04-09 ENCOUNTER — Encounter: Payer: Self-pay | Admitting: Physician Assistant

## 2020-04-09 ENCOUNTER — Telehealth: Payer: Self-pay

## 2020-04-09 ENCOUNTER — Telehealth (INDEPENDENT_AMBULATORY_CARE_PROVIDER_SITE_OTHER): Payer: Medicare Other | Admitting: Physician Assistant

## 2020-04-09 VITALS — BP 107/79 | HR 68 | Ht 60.0 in | Wt 226.0 lb

## 2020-04-09 DIAGNOSIS — I10 Essential (primary) hypertension: Secondary | ICD-10-CM

## 2020-04-09 DIAGNOSIS — J449 Chronic obstructive pulmonary disease, unspecified: Secondary | ICD-10-CM

## 2020-04-09 DIAGNOSIS — Z79899 Other long term (current) drug therapy: Secondary | ICD-10-CM

## 2020-04-09 DIAGNOSIS — E785 Hyperlipidemia, unspecified: Secondary | ICD-10-CM

## 2020-04-09 DIAGNOSIS — G459 Transient cerebral ischemic attack, unspecified: Secondary | ICD-10-CM

## 2020-04-09 DIAGNOSIS — I471 Supraventricular tachycardia: Secondary | ICD-10-CM

## 2020-04-09 DIAGNOSIS — I420 Dilated cardiomyopathy: Secondary | ICD-10-CM

## 2020-04-09 DIAGNOSIS — G4733 Obstructive sleep apnea (adult) (pediatric): Secondary | ICD-10-CM | POA: Diagnosis not present

## 2020-04-09 MED ORDER — FUROSEMIDE 20 MG PO TABS
20.0000 mg | ORAL_TABLET | Freq: Every day | ORAL | 2 refills | Status: DC
Start: 2020-04-09 — End: 2020-06-10

## 2020-04-09 MED ORDER — ENTRESTO 24-26 MG PO TABS: 1.0000 | ORAL_TABLET | Freq: Two times a day (BID) | ORAL | 0 refills | Status: DC

## 2020-04-09 NOTE — Telephone Encounter (Signed)
  Patient Consent for Virtual Visit         Brenda [name] has provided verbal consent on 04/09/2020 for a virtual visit (video or telephone).   CONSENT FOR VIRTUAL VISIT FOR:  Brenda [name]  By participating in this virtual visit I agree to the following:  I hereby voluntarily request, consent and authorize [facility] and its employed or contracted physicians, physician assistants, nurse practitioners or other licensed health care professionals (the Practitioner), to provide me with telemedicine health care services (the "Services") as deemed necessary by the treating Practitioner. I acknowledge and consent to receive the Services by the Practitioner via telemedicine. I understand that the telemedicine visit will involve communicating with the Practitioner through live audiovisual communication technology and the disclosure of certain medical information by electronic transmission. I acknowledge that I have been given the opportunity to request an in-person assessment or other available alternative prior to the telemedicine visit and am voluntarily participating in the telemedicine visit.  I understand that I have the right to withhold or withdraw my consent to the use of telemedicine in the course of my care at any time, without affecting my right to future care or treatment, and that the Practitioner or I may terminate the telemedicine visit at any time. I understand that I have the right to inspect all information obtained and/or recorded in the course of the telemedicine visit and may receive copies of available information for a reasonable fee.  I understand that some of the potential risks of receiving the Services via telemedicine include:  Marland Kitchen Delay or interruption in medical evaluation due to technological equipment failure or disruption; . Information transmitted may not be sufficient (e.g. poor resolution of images) to allow for appropriate medical decision making by the Practitioner;  and/or  . In rare instances, security protocols could fail, causing a breach of personal health information.  Furthermore, I acknowledge that it is my responsibility to provide information about my medical history, conditions and care that is complete and accurate to the best of my ability. I acknowledge that Practitioner's advice, recommendations, and/or decision may be based on factors not within their control, such as incomplete or inaccurate data provided by me or distortions of diagnostic images or specimens that may result from electronic transmissions. I understand that the practice of medicine is not an exact science and that Practitioner makes no warranties or guarantees regarding treatment outcomes. I acknowledge that a copy of this consent can be made available to me via my patient portal [facility] MyChart), or I can request a printed copy by calling the office of [facility].    I understand that my insurance will be billed for this visit.   I have read or had this consent read to me. . I understand the contents of this consent, which adequately explains the benefits and risks of the Services being provided via telemedicine.  . I have been provided ample opportunity to ask questions regarding this consent and the Services and have had my questions answered to my satisfaction. . I give my informed consent for the services to be provided through the use of telemedicine in my medical care

## 2020-04-09 NOTE — Patient Instructions (Addendum)
Medication Instructions:   DECREASE Lasix to 20 mg daily  STOP Losartan   START Entresto 24-26 mg 2 times a day. Start Entresto 24 hours after last does of Losartan.    *If you need a refill on your cardiac medications before your next appointment, please call your pharmacy*  Lab Work: Your physician recommends that you return for lab work in 1 week:  BMET  If you have labs (blood work) drawn today and your tests are completely normal, you will receive your results only by: Marland Kitchen MyChart Message (if you have MyChart) OR . A paper copy in the mail If you have any lab test that is abnormal or we need to change your treatment, we will call you to review the results.  Testing/Procedures: NONE ordered at this time of appointment   Follow-Up: At [facility], you and your health needs are our priority.  As part of our continuing mission to provide you with exceptional heart care, we have created designated Provider Care Teams.  These Care Teams include your primary Cardiologist (physician) and Advanced Practice Providers (APPs -  Physician Assistants and Nurse Practitioners) who all work together to provide you with the care you need, when you need it.  Your next appointment:   2-3 month(s)  The format for your next appointment:   In Person  Provider:   Lennie Odor, MD  Other Instructions

## 2020-04-09 NOTE — Progress Notes (Signed)
Virtual Visit via Telephone Note   This visit type was conducted due to national recommendations for restrictions regarding the COVID-19 Pandemic (e.g. social distancing) in an effort to limit this patient's exposure and mitigate transmission in our community.  Due to her co-morbid illnesses, this patient is at least at moderate risk for complications without adequate follow up.  This format is felt to be most appropriate for this patient at this time.  The patient did not have access to video technology/had technical difficulties with video requiring transitioning to audio format only (telephone).  All issues noted in this document were discussed and addressed.  No physical exam could be performed with this format.  Please refer to the patient's chart for her  consent to telehealth for [facility].    Date:  04/11/2020   ID:  [name], DOB 09-10-1960, MRN [id number] The patient was identified using 2 identifiers.  Patient Location: Home Provider Location: Home Office  PCP:  [name], MD  Cardiologist:  Reatha Harps, MD  Electrophysiologist:  None   Evaluation Performed:  Follow-Up Visit  Chief Complaint:  Follow up  History of Present Illness:    Brenda [name] is a 59 y.o. female with a hx of bipolar 1 disorder, hypertension, COPD emphysema on 2 L/min oxygen during the day and 3 L along with her CPAP at night, hyperlipidemia, and obesity.  Patient was recently admitted in early September with acute respiratory failure secondary to COPD exacerbation.  She has ongoing tobacco abuse.  High-sensitivity troponin trended up to greater than 3000.  Cardiology was consulted due to elevated troponin.  Echocardiogram obtained on 01/31/2020 was a difficult study, EF 30 to 35%, grade 2 DD.  EKG showed old anterior infarct but no ischemic changes. Although initially, cardiology service was considering left heart cath once her overall condition improves, however troponin elevation and  the echocardiogram suggests this is more likely to be Takotsubo cardiomyopathy.  She also had SVT with right bundle branch block morphology during the hospital as well.  She was started on Toprol-XL.  It was recommended continue medical therapy for LV dysfunction and recheck echocardiogram in 3 months.  If EF continues to be low, then consider additional evaluation.  Patient was released from the hospital on 9/5, however came back to the hospital 3 days later on 02/07/2020 with left-sided weakness, slurred speech, nausea and vomiting concerning for stroke versus TIA.  Stroke team has seen the patient and felt she likely has posterior circulation TIA secondary to basilar artery high-grade stenosis and recommended stenting.  CT of the head was negative for stroke or hemorrhage.  Repeat echocardiogram during the admission showed EF has improved to 40 to 45%.  She eventually underwent cerebral angiogram with patent distal basilar artery and left PCA P1 with small filling defect of the distal basilar artery, no intervention was performed.  IR recommended follow-up in a CT of the head and neck with contrast in 3 months after discharge.  Dr. Pearlean Brownie of neurology service recommended a TEE and loop recorder placement.  Initially, TEE was scheduled as inpatient, however patient did not wish to stay and wished to get a TEE as outpatient.  I last saw the patient on 03/14/2020 for heart failure medication titration.  I discussed with Dr. Flora Lipps and set her up for TEE with possible loop recorder placement on 04/02/2020.  TEE at the time showed EF 35%, mild MR, no PFO.  Medtronic Reveal LINQ implantable loop recorder was  placed on 04/02/2020 by Dr. Ladona Ridgelaylor for cryptogenic stroke.  Talking with the patient today, she is doing well.  Her blood pressure today is borderline with systolic blood pressure 107.  Given persistent LV dysfunction on the recent TEE, I recommended switching her losartan to Entresto.  I will start her at the lowest  dose of Entresto 24-26 mg twice daily after stopping the losartan given the borderline blood pressure.  She can also decrease her Lasix to 20 mg daily instead of the previous 40 mg daily dosing.  She will need 1 week basic metabolic panel and follow-up with Dr. Flora Lipps'Neal in 2 to 3 months at which time a repeat echocardiogram can be obtained.  Otherwise she denies any chest pain or shortness of breath.  The patient does not have symptoms concerning for COVID-19 infection (fever, chills, cough, or new shortness of breath).    Past Medical History:  Diagnosis Date  . Acute pancreatitis   . Asthma    main dyspnea thought due to deconditioning (10/2013)  . Bipolar I disorder, most recent episode (or current) manic, unspecified    pt denies this  . Bronchitis, chronic obstructive (HCC) 2008 & 2009   FeV1 64% TLC 105% DLCO 55% 2008  -  FeV1 81% FeF 25-75 43% 2009  . COPD (chronic obstructive pulmonary disease) (HCC)    emphysema, not an issue per pulm (10/2013)  . History of pyelonephritis 05/2012  . HTN (hypertension)   . Hx of migraines   . Knee pain, right   . Leukocytosis, unspecified   . Lumbar disc disease with radiculopathy    multilevel spondylitic changes, scoliosis, and anterolisthesis L4 not thought to currently be surg candidate (Dr. Phoebe PerchHirsch, [facility]) (Dr. Ollen BowlHarkins, [facility])  . Nicotine addiction   . Nonspecific abnormal electrocardiogram (ECG) (EKG)   . Obesity   . Pure hyperglyceridemia   . RSV (respiratory syncytial virus pneumonia) 01/2020   hospitalization  . Suicide attempt (HCC) 06/2001  . Swelling of limb   . Urge and stress incontinence 07/2012   ([facility])   Past Surgical History:  Procedure Laterality Date  . BUBBLE STUDY  04/02/2020   Procedure: BUBBLE STUDY;  Surgeon: Vesta MixerNahser, Philip J, MD;  [facility];  Service: Cardiovascular;;  . CHOLECYSTECTOMY  07/1982  . CT MAXILLOFACIAL WO/W CM  06/14/06   Left max mucocele  . CT of chest  06/14/2006   Normal except  c/w active inflammation / infection.  Left renal atrophy  . ERCP / sphincterotomy - stenosis  01/15/06  . IR ANGIO INTRA EXTRACRAN SEL COM CAROTID INNOMINATE BILAT MOD SED  02/09/2020  . IR ANGIO VERTEBRAL SEL SUBCLAVIAN INNOMINATE UNI L MOD SED  02/09/2020  . IR ANGIO VERTEBRAL SEL VERTEBRAL UNI R MOD SED  02/09/2020  . IR ANGIO VERTEBRAL SEL VERTEBRAL UNI R MOD SED  02/13/2020  . IR US GUIDE VASC ACCESS RIGHT  02/09/2020  . IR US GUIDE VASC ACCESS RIGHT  02/13/2020  . LOOP RECORDER INSERTION N/A 04/02/2020   Procedure: LOOP RECORDER INSERTION;  Surgeon: Marinus Mawaylor, Gregg W, MD;  [facility];  Service: Cardiovascular;  Laterality: N/A;  . lumbar MRI  11/2010   multilevel spondylitic changes with upper lumbar scoliosis and anterolisthesis of L4 on 5 with some L foraminal narrowing esp at L/3 with concavity of scoliosis, some central stenosis and biforaminal narrowing at L4/5 with spondylolisthesis  . [place] - Pneumonia, acute asthma, left maxillary sinusitis  01/11 - 06/18/2006  . RADIOLOGY  WITH ANESTHESIA N/A 02/13/2020   Procedure: Carotid Stent;  Surgeon: Julieanne Cotton, MD;  Location: [facility] OR;  Service: Radiology;  Laterality: N/A;  . Retained stone     ERCP secondary to retained stone  . Right knee surgery  1984,1989,1989,1991,1994   Reconstructions for ACL insuff  . TEE WITHOUT CARDIOVERSION N/A 04/02/2020   Procedure: TRANSESOPHAGEAL ECHOCARDIOGRAM (TEE);  Surgeon: Elease Hashimoto, Deloris Ping, MD;  Location: [facility] ENDOSCOPY;  Service: Cardiovascular;  Laterality: N/A;  . TUBAL LIGATION       Current Meds  Medication Sig  . albuterol (PROVENTIL) (2.5 MG/3ML) 0.083% nebulizer solution INHALE 1 VIAL VIA NEBULIZER EVERY 6 HOURS AS NEEDED FOR WHEEZING/SHORTNESS OF BREATH  . ARIPiprazole (ABILIFY) 5 MG tablet TAKE 1 TABLET BY MOUTH EVERY DAY (Patient taking differently: Take 5 mg by mouth daily. )  . aspirin EC 81 MG EC tablet Take 1 tablet (81 mg total) by mouth daily. Swallow whole.  Marland Kitchen  atorvastatin (LIPITOR) 40 MG tablet Take 1 tablet (40 mg total) by mouth at bedtime.  . benzonatate (TESSALON) 200 MG capsule Take 1 capsule (200 mg total) by mouth 3 (three) times daily as needed for cough. Swallow whole, to not bite pill  . Budeson-Glycopyrrol-Formoterol (BREZTRI AEROSPHERE) 160-9-4.8 MCG/ACT AERO Inhale 2 puffs into the lungs 2 (two) times daily.  . cetirizine (ZYRTEC) 10 MG tablet Take 10 mg by mouth at bedtime.  . clopidogrel (PLAVIX) 75 MG tablet Take 1 tablet (75 mg total) by mouth daily.  Marland Kitchen dextromethorphan-guaiFENesin (MUCINEX DM) 30-600 MG 12hr tablet Take 1 tablet by mouth 2 (two) times daily as needed for cough.  . escitalopram (LEXAPRO) 20 MG tablet TAKE 1 AND 1/2 TABLETS DAILY BY MOUTH (Patient taking differently: Take 30 mg by mouth daily. )  . famotidine (PEPCID) 20 MG tablet TAKE 1 TABLET BY MOUTH EVERYDAY AT BEDTIME (Patient taking differently: Take 20 mg by mouth at bedtime. )  . fluticasone (FLONASE) 50 MCG/ACT nasal spray Place 1 spray into both nostrils 2 (two) times daily. (Patient taking differently: Place 2 sprays into both nostrils daily. )  . furosemide (LASIX) 20 MG tablet Take 1 tablet (20 mg total) by mouth daily.  Marland Kitchen gabapentin (NEURONTIN) 300 MG capsule TAKE 2 TABLETS IN THE MORNING AND 3 TABLETS AT NIGHT (Patient taking differently: Take 600-900 mg by mouth See admin instructions. TAKE 2 TABLETS (600 MG) BY MOUTH IN THE MORNING AND 3 TABLETS (900 MG) BY MOUTH AT NIGHT)  . LORazepam (ATIVAN) 0.5 MG tablet TAKE 1/2 TO 1 TABLET BY MOUTH TWICE A DAY AS NEEDED FOR ANXIETY (Patient taking differently: Take 0.25-0.5 mg by mouth 2 (two) times daily as needed for anxiety. )  . metFORMIN (GLUCOPHAGE) 500 MG tablet TAKE 1 TABLET BY MOUTH EVERYDAY AT BEDTIME (Patient taking differently: Take 500 mg by mouth daily with breakfast. )  . metoprolol succinate (TOPROL-XL) 25 MG 24 hr tablet Take 1 tablet (25 mg total) by mouth daily.  . Multiple Vitamin (MULTIVITAMIN WITH  MINERALS) TABS tablet Take 1 tablet by mouth daily.  Marland Kitchen nystatin (MYCOSTATIN) 100000 UNIT/ML suspension TAKE 1 TEASPOONFUL BY MOUTH 3 TIMES DAILY AS NEEDED FOR MOUTH SORES. (Patient taking differently: Take 5 mLs by mouth 3 (three) times daily as needed (mouth sores). )  . PROAIR RESPICLICK 108 (90 Base) MCG/ACT AEPB INHALE 2 PUFFS INTO THE LUNGS EVERY 6 HOURS AS NEEDED FOR DYSPNEA/WHEEZE (Patient taking differently: Inhale 2 puffs into the lungs every 6 (six) hours as needed (dyspnea/wheezing.). )  . promethazine-dextromethorphan (  PROMETHAZINE-DM) 6.25-15 MG/5ML syrup Take 5 mLs by mouth 4 (four) times daily as needed for cough.  . [DISCONTINUED] furosemide (LASIX) 20 MG tablet TAKE 2 TABLETS BY MOUTH EVERY DAY (Patient taking differently: Take 20 mg by mouth 2 (two) times daily. )  . [DISCONTINUED] losartan (COZAAR) 25 MG tablet Take 1 tablet (25 mg total) by mouth daily.     Allergies:   Metolazone   Social History   Tobacco Use  . Smoking status: Current Some Day Smoker    Years: 41.00    Types: Cigarettes  . Smokeless tobacco: Never Used  . Tobacco comment: 3 cigs per day 05/29/19 lmr  Vaping Use  . Vaping Use: Never used  Substance Use Topics  . Alcohol use: Yes    Alcohol/week: 0.0 standard drinks    Comment: occasional  . Drug use: No     Family Hx: The patient's family history includes Asthma in her father; Breast cancer in her paternal grandmother; Heart disease in her brother and mother; Hyperlipidemia in her mother; Hypertension in her father and mother.  ROS:   Please see the history of present illness.     All other systems reviewed and are negative.   Prior CV studies:   The following studies were reviewed today:  TEE 04/02/2020 1. Left ventricular ejection fraction, by estimation, is 35 to 40%. The  left ventricle has moderately decreased function. The left ventricle  demonstrates global hypokinesis. The left ventricular internal cavity size  was mildly  dilated.  2. Right ventricular systolic function is normal. The right ventricular  size is normal.  3. No left atrial/left atrial appendage thrombus was detected.  4. The mitral valve is grossly normal. Mild mitral valve regurgitation.  5. The aortic valve is normal in structure. Aortic valve regurgitation is  not visualized. No aortic stenosis is present.   Labs/Other Tests and Data Reviewed:    EKG:  An ECG dated 02/07/2020 was personally reviewed today and demonstrated:  Normal sinus rhythm with T wave inversion in the lateral leads.  Recent Labs: 01/30/2020: B Natriuretic Peptide 47.7 02/07/2020: ALT 126 02/08/2020: TSH 0.821 03/06/2020: BUN 13; Creatinine, Ser 0.90; Hemoglobin 11.2; Platelets 319; Potassium 4.1; Sodium 142   Recent Lipid Panel Lab Results  Component Value Date/Time   CHOL 117 02/09/2020 02:14 AM   TRIG 139 02/09/2020 02:14 AM   HDL 37 (L) 02/09/2020 02:14 AM   CHOLHDL 3.2 02/09/2020 02:14 AM   LDLCALC 52 02/09/2020 02:14 AM    Wt Readings from Last 3 Encounters:  04/09/20 226 lb (102.5 kg)  04/02/20 224 lb (101.6 kg)  03/14/20 226 lb (102.5 kg)     Risk Assessment/Calculations:      Objective:    Vital Signs:  BP 107/79   Pulse 68   Ht 5' (1.524 m)   Wt 226 lb (102.5 kg)   LMP 03/08/2013   BMI 44.14 kg/m    VITAL SIGNS:  reviewed  ASSESSMENT & PLAN:    1. Dilated cardiomyopathy: Likely Takotsubo cardiomyopathy.  Continue metoprolol succinate, will convert her losartan to a low-dose Entresto.  Although we can potentially transition her to the moderate dose Entresto, however her blood pressure is borderline low today and I wish to be less aggressive.  Given addition of Entresto, she can reduce her diuretic Lasix.  2. History of SVT: No further recurrence since hospitalization  3. History of TIA: At the recommendation of Dr. Pearlean Brownie, TEE was performed which showed no obvious thrombus.  Loop recorder was subsequently implanted  4. Hypertension:  Blood pressure borderline today, will transition her from losartan to Entresto  5. Hyperlipidemia: Continue Lipitor  6. COPD: On home oxygen   COVID-19 Education: The signs and symptoms of COVID-19 were discussed with the patient and how to seek care for testing (follow up with PCP or arrange E-visit).  The importance of social distancing was discussed today.  Time:   Today, I have spent 6 minutes with the patient with telehealth technology discussing the above problems.     Medication Adjustments/Labs and Tests Ordered: Current medicines are reviewed at length with the patient today.  Concerns regarding medicines are outlined above.   Tests Ordered: Orders Placed This Encounter  Procedures  . Basic metabolic panel    Medication Changes: Meds ordered this encounter  Medications  . sacubitril-valsartan (ENTRESTO) 24-26 MG    Sig: Take 1 tablet by mouth 2 (two) times daily.    Dispense:  60 tablet    Refill:  0  . furosemide (LASIX) 20 MG tablet    Sig: Take 1 tablet (20 mg total) by mouth daily.    Dispense:  60 tablet    Refill:  2    Dose change    Follow Up:  In Person in 2 month(s)  Signed, Azalee Course, [place]  04/11/2020 11:02 PM    [facility]

## 2020-04-11 ENCOUNTER — Encounter: Payer: Self-pay | Admitting: Physician Assistant

## 2020-04-11 NOTE — Telephone Encounter (Signed)
Returned [employer] call asking informing him we have not received the form.  States he was able to go ahead an process current form, just waiting now for it to go through.

## 2020-04-15 ENCOUNTER — Other Ambulatory Visit: Payer: Self-pay | Admitting: Family Medicine

## 2020-04-16 NOTE — Telephone Encounter (Signed)
Name of Medication: Lorazepam Name of Pharmacy: [facility] or Written Date and Quantity: 03/13/20, #30 Last Office Visit and Type: 02/23/20, hosp f/u Next Office Visit and Type: none Last Controlled Substance Agreement Date: none Last UDS: none

## 2020-04-17 NOTE — Telephone Encounter (Signed)
ERx 

## 2020-04-18 ENCOUNTER — Other Ambulatory Visit: Payer: Self-pay

## 2020-04-18 ENCOUNTER — Ambulatory Visit (INDEPENDENT_AMBULATORY_CARE_PROVIDER_SITE_OTHER): Payer: Medicare Other | Admitting: Emergency Medicine

## 2020-04-18 ENCOUNTER — Telehealth: Payer: Self-pay

## 2020-04-18 DIAGNOSIS — G459 Transient cerebral ischemic attack, unspecified: Secondary | ICD-10-CM

## 2020-04-18 DIAGNOSIS — Z79899 Other long term (current) drug therapy: Secondary | ICD-10-CM | POA: Diagnosis not present

## 2020-04-18 LAB — CUP PACEART INCLINIC DEVICE CHECK
Date Time Interrogation Session: 20211118155031
Implantable Pulse Generator Implant Date: 20211102

## 2020-04-18 NOTE — Telephone Encounter (Signed)
Patient is having issues with MDT APP logging in. Attempted to assist patient, unsuccessful. Patient will call [employer] and call back when she can get app working.  Would like to see if patients monitor is connecting 04/19/20 or after.

## 2020-04-18 NOTE — Progress Notes (Signed)
ILR wound check in clinic. Steri strips removed prior to OV. Wound well healed. Home monitor last transmitted 04/16/20.  Patients app will not work, attempted to assist patient. Patient given phone number to have [employer] help with password reset. Questions answered.   R wave 0.15 mV, Routing to Dr. Ladona Ridgel to make aware.

## 2020-04-19 LAB — BASIC METABOLIC PANEL
BUN/Creatinine Ratio: 16 (ref 12–28)
BUN: 17 mg/dL (ref 8–27)
CO2: 26 mmol/L (ref 20–29)
Calcium: 9.9 mg/dL (ref 8.7–10.3)
Chloride: 100 mmol/L (ref 96–106)
Creatinine, Ser: 1.08 mg/dL — ABNORMAL HIGH (ref 0.57–1.00)
GFR calc Af Amer: 64 mL/min/{1.73_m2} (ref >59)
GFR calc non Af Amer: 56 mL/min/{1.73_m2} — ABNORMAL LOW (ref >59)
Glucose: 94 mg/dL (ref 65–99)
Potassium: 4.4 mmol/L (ref 3.5–5.2)
Sodium: 142 mmol/L (ref 134–144)

## 2020-04-19 NOTE — Telephone Encounter (Signed)
Transmission received 04/19/20

## 2020-04-23 ENCOUNTER — Other Ambulatory Visit: Payer: Self-pay | Admitting: Family Medicine

## 2020-04-29 ENCOUNTER — Ambulatory Visit (INDEPENDENT_AMBULATORY_CARE_PROVIDER_SITE_OTHER): Payer: Medicare Other | Admitting: Neurology

## 2020-04-29 ENCOUNTER — Encounter: Payer: Self-pay | Admitting: Neurology

## 2020-04-29 VITALS — BP 144/71 | HR 68 | Ht 60.5 in | Wt 232.0 lb

## 2020-04-29 DIAGNOSIS — G459 Transient cerebral ischemic attack, unspecified: Secondary | ICD-10-CM

## 2020-04-29 NOTE — Patient Instructions (Signed)
I had a long d/w patient about her recent TIA, risk for recurrent  Cryptogenic stroke/TIAs, personally independently reviewed imaging studies and stroke evaluation results and answered questions.Continue aspirin 81 mg daily  for secondary stroke prevention alone and stop brillinta now due to bruising and maintain strict control of hypertension with blood pressure goal below 130/90, diabetes with hemoglobin A1c goal below 6.5% and lipids with LDL cholesterol goal below 70 mg/dL. I also advised the patient to eat a healthy diet with plenty of whole grains, cereals, fruits and vegetables, exercise regularly and maintain ideal body weight Followup in the future with my NP Jessica in 3 months or call earlier if necessary.  Stroke Prevention Some medical conditions and behaviors are associated with a higher chance of having a stroke. You can help prevent a stroke by making nutrition, lifestyle, and other changes, including managing any medical conditions you may have. What nutrition changes can be made?   Eat healthy foods. You can do this by: ? Choosing foods high in fiber, such as fresh fruits and vegetables and whole grains. ? Eating at least 5 or more servings of fruits and vegetables a day. Try to fill half of your plate at each meal with fruits and vegetables. ? Choosing lean protein foods, such as lean cuts of meat, poultry without skin, fish, tofu, beans, and nuts. ? Eating low-fat dairy products. ? Avoiding foods that are high in salt (sodium). This can help lower blood pressure. ? Avoiding foods that have saturated fat, trans fat, and cholesterol. This can help prevent high cholesterol. ? Avoiding processed and premade foods.  Follow your health care provider's specific guidelines for losing weight, controlling high blood pressure (hypertension), lowering high cholesterol, and managing diabetes. These may include: ? Reducing your daily calorie intake. ? Limiting your daily sodium intake to  1,500 milligrams (mg). ? Using only healthy fats for cooking, such as olive oil, canola oil, or sunflower oil. ? Counting your daily carbohydrate intake. What lifestyle changes can be made?  Maintain a healthy weight. Talk to your health care provider about your ideal weight.  Get at least 30 minutes of moderate physical activity at least 5 days a week. Moderate activity includes brisk walking, biking, and swimming.  Do not use any products that contain nicotine or tobacco, such as cigarettes and e-cigarettes. If you need help quitting, ask your health care provider. It may also be helpful to avoid exposure to secondhand smoke.  Limit alcohol intake to no more than 1 drink a day for nonpregnant women and 2 drinks a day for men. One drink equals 12 oz of beer, 5 oz of wine, or 1 oz of hard liquor.  Stop any illegal drug use.  Avoid taking birth control pills. Talk to your health care provider about the risks of taking birth control pills if: ? You are over 30 years old. ? You smoke. ? You get migraines. ? You have ever had a blood clot. What other changes can be made?  Manage your cholesterol levels. ? Eating a healthy diet is important for preventing high cholesterol. If cholesterol cannot be managed through diet alone, you may also need to take medicines. ? Take any prescribed medicines to control your cholesterol as told by your health care provider.  Manage your diabetes. ? Eating a healthy diet and exercising regularly are important parts of managing your blood sugar. If your blood sugar cannot be managed through diet and exercise, you may need to take medicines. ?  Take any prescribed medicines to control your diabetes as told by your health care provider.  Control your hypertension. ? To reduce your risk of stroke, try to keep your blood pressure below 130/80. ? Eating a healthy diet and exercising regularly are an important part of controlling your blood pressure. If your blood  pressure cannot be managed through diet and exercise, you may need to take medicines. ? Take any prescribed medicines to control hypertension as told by your health care provider. ? Ask your health care provider if you should monitor your blood pressure at home. ? Have your blood pressure checked every year, even if your blood pressure is normal. Blood pressure increases with age and some medical conditions.  Get evaluated for sleep disorders (sleep apnea). Talk to your health care provider about getting a sleep evaluation if you snore a lot or have excessive sleepiness.  Take over-the-counter and prescription medicines only as told by your health care provider. Aspirin or blood thinners (antiplatelets or anticoagulants) may be recommended to reduce your risk of forming blood clots that can lead to stroke.  Make sure that any other medical conditions you have, such as atrial fibrillation or atherosclerosis, are managed. What are the warning signs of a stroke? The warning signs of a stroke can be easily remembered as BEFAST.  B is for balance. Signs include: ? Dizziness. ? Loss of balance or coordination. ? Sudden trouble walking.  E is for eyes. Signs include: ? A sudden change in vision. ? Trouble seeing.  F is for face. Signs include: ? Sudden weakness or numbness of the face. ? The face or eyelid drooping to one side.  A is for arms. Signs include: ? Sudden weakness or numbness of the arm, usually on one side of the body.  S is for speech. Signs include: ? Trouble speaking (aphasia). ? Trouble understanding.  T is for time. ? These symptoms may represent a serious problem that is an emergency. Do not wait to see if the symptoms will go away. Get medical help right away. Call your local emergency services (911 in the [place] Do not drive yourself to the hospital.  Other signs of stroke may include: ? A sudden, severe headache with no known cause. ? Nausea or  vomiting. ? Seizure. Where to find more information For more information, visit:  American Stroke Association: www.strokeassociation.org  National Stroke Association: www.stroke.org Summary  You can prevent a stroke by eating healthy, exercising, not smoking, limiting alcohol intake, and managing any medical conditions you may have.  Do not use any products that contain nicotine or tobacco, such as cigarettes and e-cigarettes. If you need help quitting, ask your health care provider. It may also be helpful to avoid exposure to secondhand smoke.  Remember BEFAST for warning signs of stroke. Get help right away if you or a loved one has any of these signs. This information is not intended to replace advice given to you by your health care provider. Make sure you discuss any questions you have with your health care provider. Document Revised: 04/30/2017 Document Reviewed: 06/23/2016 Elsevier Patient Education  2020 [employer].

## 2020-04-29 NOTE — Progress Notes (Signed)
[facility] [address] [place]. [place] [address] [phone]       OFFICE FOLLOW-UP NOTE  Brenda [name] Date of Birth:  1961/01/22 Medical Record Number:  [id number]   HPI: Brenda [name] is a 59 year old Caucasian lady seen today for initial office follow-up visit following consultation for TIA in September 2021.  History is obtained from Brenda [name], review of electronic medical records and I personally reviewed available pertinent imaging films in PACS.  Brenda [name] is a 59 year old Caucasian lady with past medical history of diabetes, hyperlipidemia, hypertension, COPD on home oxygen, obstructive sleep apnea, obesity who presented on 02/07/2020 to [facility] with sudden onset of left hemiparesis associated with nausea, vomiting, slurred speech and vertically stacked double vision.  CTA was unremarkable with CT angiogram of Brenda head and neck suggested basilar artery stenosis.  Dr. Corliss Skains did diagnostic cerebral catheter angiogram on 02/12/2020 which showed high-grade 90-95% distal basilar artery stenosis and plan was to do elective stenting in a week.  She was placed on aspirin and Brilinta.  2D echo showed ejection fraction of 40 to 45% which was actually improved from previous echo from 01/31/2020 where her EF was 30 to 35%.  LDL cholesterol 52 mg percent.  Hemoglobin A1c was 6.2.  Urine drug screen was positive for cannabis.  [name] had repeat angiogram on 02/19/2020 a week later with intent for angioplasty but Brenda stenosis was much improved and nonocclusive and hence no further revascularization was deemed necessary.  [name] etiology of stroke was felt to be cryptogenic embolism and hence [name] underwent subsequently loop recorder placement on 04/02/2020 and so for paroxysmal A. fib has not yet been found.  Brenda [name] is currently on aspirin and Brilinta tolerating it well but does have minor bruising.  She has had no recurrent diplopia dysarthria or focal extremity  weakness TIA or other stroke symptoms.  Blood pressures well controlled at home Brenda today it is borderline in office at 144/71.  Fasting sugars have all ranged in Brenda 110-120 range last hemoglobin A1c was 6.2.  Brenda [name] has gained 10 pounds recently due to stress plans to go on a diet and exercise and lose it off soon.  She remains on home oxygen for her COPD.  She has no new complaints.  ROS:   14 system review of systems is positive for shortness of breath, dizziness, slurred speech, double vision, weakness, numbness and all other systems negative. PMH:  Past Medical History:  Diagnosis Date  . Acute pancreatitis   . Asthma    main dyspnea thought due to deconditioning (10/2013)  . Bipolar I disorder, most recent episode (or current) manic, unspecified    pt denies this  . Bronchitis, chronic obstructive (HCC) 2008 & 2009   FeV1 64% TLC 105% DLCO 55% 2008  -  FeV1 81% FeF 25-75 43% 2009  . COPD (chronic obstructive pulmonary disease) (HCC)    emphysema, not an issue per pulm (10/2013)  . History of pyelonephritis 05/2012  . HTN (hypertension)   . Hx of migraines   . Knee pain, right   . Leukocytosis, unspecified   . Lumbar disc disease with radiculopathy    multilevel spondylitic changes, scoliosis, and anterolisthesis L4 not thought to currently be surg candidate (Dr. Phoebe Perch, [facility]) (Dr. Ollen Bowl, [place])  . Nicotine addiction   . Nonspecific abnormal electrocardiogram (ECG) (EKG)   . Obesity   . Pure hyperglyceridemia   . RSV (respiratory syncytial virus pneumonia) 01/2020  hospitalization  . Suicide attempt (HCC) 06/2001  . Swelling of limb   . Urge and stress incontinence 07/2012   ([facility])    Social History:  Social History   Socioeconomic History  . Marital status: Widowed    Spouse name: Not on file  . Number of children: 3  . Years of education: Not on file  . Highest education level: Not on file  Occupational History  . Occupation: Barrister's clerk: [employer]     Employer: [employer]   Tobacco Use  . Smoking status: Current Some Day Smoker    Years: 41.00    Types: Cigarettes  . Smokeless tobacco: Never Used  . Tobacco comment: 3 cigs per day 05/29/19 lmr  Vaping Use  . Vaping Use: Never used  Substance and Sexual Activity  . Alcohol use: Yes    Alcohol/week: 0.0 standard drinks    Comment: occasional  . Drug use: No  . Sexual activity: Not on file  Other Topics Concern  . Not on file  Social History Narrative   Lives with husband and son and daughter in [place] and grandchild   Occupation: Media planner at [employer]   Activity: limited by dyspnea   Diet: good water, fruits/vegetables daily   Social Determinants of Health   Financial Resource Strain:   . Difficulty of Paying Living Expenses: Not on file  Food Insecurity:   . Worried About Programme researcher, broadcasting/film/video in Brenda Last Year: Not on file  . Ran Out of Food in Brenda Last Year: Not on file  Transportation Needs:   . Lack of Transportation (Medical): Not on file  . Lack of Transportation (Non-Medical): Not on file  Physical Activity:   . Days of Exercise per Week: Not on file  . Minutes of Exercise per Session: Not on file  Stress:   . Feeling of Stress : Not on file  Social Connections:   . Frequency of Communication with Friends and Family: Not on file  . Frequency of Social Gatherings with Friends and Family: Not on file  . Attends Religious Services: Not on file  . Active Member of Clubs or Organizations: Not on file  . Attends Banker Meetings: Not on file  . Marital Status: Not on file  Intimate Partner Violence:   . Fear of Current or Ex-Partner: Not on file  . Emotionally Abused: Not on file  . Physically Abused: Not on file  . Sexually Abused: Not on file    Medications:   Current Outpatient Medications on File Prior to Visit  Medication Sig Dispense Refill  . albuterol (PROVENTIL) (2.5 MG/3ML)  0.083% nebulizer solution INHALE 1 VIAL VIA NEBULIZER EVERY 6 HOURS AS NEEDED FOR WHEEZING/SHORTNESS OF BREATH 150 mL 1  . ARIPiprazole (ABILIFY) 5 MG tablet TAKE 1 TABLET BY MOUTH EVERY DAY ([name] taking differently: Take 5 mg by mouth daily. ) 90 tablet 1  . aspirin EC 81 MG EC tablet Take 1 tablet (81 mg total) by mouth daily. Swallow whole. 30 tablet 0  . atorvastatin (LIPITOR) 40 MG tablet Take 1 tablet (40 mg total) by mouth at bedtime. 30 tablet 2  . benzonatate (TESSALON) 200 MG capsule Take 1 capsule (200 mg total) by mouth 3 (three) times daily as needed for cough. Swallow whole, to not bite pill 45 capsule 2  . Budeson-Glycopyrrol-Formoterol (BREZTRI AEROSPHERE) 160-9-4.8 MCG/ACT AERO Inhale 2 puffs into Brenda lungs 2 (two) times daily.  10.7 g 11  . cetirizine (ZYRTEC) 10 MG tablet Take 10 mg by mouth at bedtime.    Marland Kitchen dextromethorphan-guaiFENesin (MUCINEX DM) 30-600 MG 12hr tablet Take 1 tablet by mouth 2 (two) times daily as needed for cough.    . escitalopram (LEXAPRO) 20 MG tablet TAKE 1 AND 1/2 TABLETS DAILY BY MOUTH ([name] taking differently: Take 30 mg by mouth daily. ) 135 tablet 2  . famotidine (PEPCID) 20 MG tablet TAKE 1 TABLET BY MOUTH EVERYDAY AT BEDTIME ([name] taking differently: Take 20 mg by mouth at bedtime. ) 90 tablet 2  . fluticasone (FLONASE) 50 MCG/ACT nasal spray Place 1 spray into both nostrils 2 (two) times daily. ([name] taking differently: Place 2 sprays into both nostrils daily. ) 16 g 11  . furosemide (LASIX) 20 MG tablet Take 1 tablet (20 mg total) by mouth daily. 60 tablet 2  . gabapentin (NEURONTIN) 300 MG capsule TAKE 2 TABLETS IN Brenda MORNING AND 3 TABLETS AT NIGHT ([name] taking differently: Take 600-900 mg by mouth See admin instructions. TAKE 2 TABLETS (600 MG) BY MOUTH IN Brenda MORNING AND 3 TABLETS (900 MG) BY MOUTH AT NIGHT) 450 capsule 1  . LORazepam (ATIVAN) 0.5 MG tablet TAKE 1/2 TO 1 TABLET BY MOUTH TWICE A DAY AS NEEDED FOR ANXIETY 30 tablet 0   . metFORMIN (GLUCOPHAGE) 500 MG tablet TAKE 1 TABLET BY MOUTH EVERYDAY AT BEDTIME ([name] taking differently: Take 500 mg by mouth daily with breakfast. ) 90 tablet 3  . metoprolol succinate (TOPROL-XL) 25 MG 24 hr tablet Take 1 tablet (25 mg total) by mouth daily. 90 tablet 3  . Multiple Vitamin (MULTIVITAMIN WITH MINERALS) TABS tablet Take 1 tablet by mouth daily.    Marland Kitchen nystatin (MYCOSTATIN) 100000 UNIT/ML suspension TAKE 1 TEASPOONFUL BY MOUTH 3 TIMES DAILY AS NEEDED FOR MOUTH SORES. ([name] taking differently: Take 5 mLs by mouth 3 (three) times daily as needed (mouth sores). ) 150 mL 1  . PROAIR RESPICLICK 108 (90 Base) MCG/ACT AEPB INHALE 2 PUFFS INTO Brenda LUNGS EVERY 6 HOURS AS NEEDED FOR DYSPNEA/WHEEZE ([name] taking differently: Inhale 2 puffs into Brenda lungs every 6 (six) hours as needed (dyspnea/wheezing.). ) 2 each 3  . promethazine-dextromethorphan (PROMETHAZINE-DM) 6.25-15 MG/5ML syrup Take 5 mLs by mouth 4 (four) times daily as needed for cough. 240 mL 0  . sacubitril-valsartan (ENTRESTO) 24-26 MG Take 1 tablet by mouth 2 (two) times daily. 60 tablet 0   No current facility-administered medications on file prior to visit.    Allergies:   Allergies  Allergen Reactions  . Metolazone Other (See Comments)    Metabolic mood swings    Physical Exam General: Obese middle-aged Caucasian lady.  She is on home oxygen., seated, in no evident distress Head: head normocephalic and atraumatic.  Neck: supple with no carotid or supraclavicular bruits Cardiovascular: regular rate and rhythm, no murmurs Musculoskeletal: no deformity Skin:  no rash/petichiae Vascular:  Normal pulses all extremities Vitals:   04/29/20 0851  BP: (!) 144/71  Pulse: 68   Neurologic Exam Mental Status: Awake and fully alert. Oriented to place and time. Recent and remote memory intact. Attention span, concentration and fund of knowledge appropriate. Mood and affect appropriate.  Cranial Nerves: Fundoscopic  exam reveals sharp disc margins. Pupils equal, briskly reactive to light. Extraocular movements full without nystagmus. Visual fields full to confrontation. Hearing intact. Facial sensation intact. Face, tongue, palate moves normally and symmetrically.  Motor: Normal bulk and tone. Normal strength in all tested  extremity muscles. Sensory.: intact to touch ,pinprick .position and vibratory sensation.  Coordination: Rapid alternating movements normal in all extremities. Finger-to-nose and heel-to-shin performed accurately bilaterally. Gait and Station: Arises from chair with slight  difficulty. Stance is normal. Gait is cautious but with normal stride length and balance . Able to heel, toe and tandem walk without difficulty.  Reflexes: 1+ and symmetric. Toes downgoing.   NIHSS  0 Modified [place]  2   ASSESSMENT: 59 year old Caucasian lady with posterior circulation TIA in September 2021 secondary to embolism to distal basilar artery of cryptogenic etiology.  Vascular risk factors of diabetes, hypertension, hyperlipidemia, obesity, obstructive sleep apnea and LV dysfunction.     PLAN: I had a long d/w [name] about her recent TIA, risk for recurrent  Cryptogenic stroke/TIAs, personally independently reviewed imaging studies and stroke evaluation results and answered questions.Continue aspirin 81 mg daily  for secondary stroke prevention alone and stop brillinta now due to bruising and maintain strict control of hypertension with blood pressure goal below 130/90, diabetes with hemoglobin A1c goal below 6.5% and lipids with LDL cholesterol goal below 70 mg/dL. I also advised Brenda [name] to eat a healthy diet with plenty of whole grains, cereals, fruits and vegetables, exercise regularly and maintain ideal body weight Followup in Brenda future with my NP Jessica in 3 months or call earlier if necessary. Greater than 50% of time during this 35 minute   visit was spent on counseling,explanation of diagnosis,  planning of further management, discussion with [name] and family and coordination of care Delia HeadyPramod Leni Pankonin, MD Note: This document was prepared with digital dictation and possible smart phrase technology. Any transcriptional errors that result from this process are unintentional

## 2020-04-30 ENCOUNTER — Encounter: Payer: Self-pay | Admitting: Family Medicine

## 2020-04-30 DIAGNOSIS — F1220 Cannabis dependence, uncomplicated: Secondary | ICD-10-CM | POA: Insufficient documentation

## 2020-05-05 ENCOUNTER — Other Ambulatory Visit: Payer: Self-pay | Admitting: Physician Assistant

## 2020-05-06 ENCOUNTER — Ambulatory Visit (INDEPENDENT_AMBULATORY_CARE_PROVIDER_SITE_OTHER): Payer: Medicare Other

## 2020-05-06 DIAGNOSIS — G459 Transient cerebral ischemic attack, unspecified: Secondary | ICD-10-CM | POA: Diagnosis not present

## 2020-05-08 LAB — CUP PACEART REMOTE DEVICE CHECK
Date Time Interrogation Session: 20211205170242
Implantable Pulse Generator Implant Date: 20211102

## 2020-05-15 NOTE — Progress Notes (Signed)
Carelink Summary Report / Loop Recorder 

## 2020-05-22 ENCOUNTER — Other Ambulatory Visit: Payer: Self-pay | Admitting: Family Medicine

## 2020-05-23 ENCOUNTER — Other Ambulatory Visit: Payer: Self-pay | Admitting: Family Medicine

## 2020-05-23 NOTE — Telephone Encounter (Signed)
Name of Medication: Lorazepam Name of Pharmacy: [facility] Last Fill or Written Date and Quantity: 04/17/20, #30 Last Office Visit and Type: 02/23/20, hosp f/u Next Office Visit and Type: none Last Controlled Substance Agreement Date: none Last UDS: none

## 2020-05-27 NOTE — Telephone Encounter (Signed)
ERx 

## 2020-06-08 LAB — CUP PACEART REMOTE DEVICE CHECK
Date Time Interrogation Session: 20220107170431
Implantable Pulse Generator Implant Date: 20211102

## 2020-06-09 NOTE — Progress Notes (Signed)
Cardiology Office Note:   Date:  06/10/2020  NAME:  Brenda [name]    MRN: [id number] DOB:  February 09, 1961   PCP:  [name], MD  Cardiologist:  Reatha Harps, MD   Referring MD: [name], MD   Chief Complaint  Patient presents with  . Follow-up         History of Present Illness:   Brenda [name] is a 60 y.o. female with a hx of Takotsubo CM, HLD, DM, CVA, obesity who presents for follow-up. Stress induced CM 01/2020 in setting of COPD exacerbation. WMA and EF have improved. Cryptogenic CVA 1 week after diagnosis and TEE negative and loop recorder placed. No atrial fibrillation has been found.  She seems to doing well from a heart failure standpoint.  She is euvolemic on examination.  Weights are up but I suspect this is due to increased adiposity.  For her heart failure regimen she is on metoprolol succinate 25 mg a day and Entresto 24-26 mg twice daily.  Her blood pressure is 125/70.  Heart rate 63.  She is still smoking 4 to 5 cigarettes/day.  She has severe COPD and requires 2 L of oxygen during the day and 3 L at night.  Her most recent lipid profile shows her LDL cholesterol is 52.  She does need a repeat echocardiogram.  All of her risk factors appear to be at goal.  I would like to repeat her ultrasound to determine if she needs a stress test.  Her EF has steadily improved but we do need documentation on her transthoracic study.  She denies any angina.  She is not exercising.  She does get quite short of breath but this is likely COPD related.  She has been encouraged to stop smoking.  Problem List 1. NSTEMI 01/30/2020  -stress induced CM 2. Takotsubo CM  -EF 30-35% with apical akinesis 01/31/2020 in setting of COPD/hypercarbic respiratory failure   -EF 40-45% with improvement in [facility] 02/08/2020 -EF 50-55% on TEE 04/02/2020 on my read  3. COPD -severe COPD -2L 4. Tobacco abuse 5. HTN 6. DM -A1c 6.1 7. HLD -T chol 117, HDL 37, LDL 52, TG 139 8. CVA 01/2020/Cryptogenic   -basilar artery stenosis? -TEE negative -loop recorder placed  9. SVT   Past Medical History: Past Medical History:  Diagnosis Date  . Acute pancreatitis   . Asthma    main dyspnea thought due to deconditioning (10/2013)  . Bipolar I disorder, most recent episode (or current) manic, unspecified    pt denies this  . Bronchitis, chronic obstructive (HCC) 2008 & 2009   FeV1 64% TLC 105% DLCO 55% 2008  -  FeV1 81% FeF 25-75 43% 2009  . COPD (chronic obstructive pulmonary disease) (HCC)    emphysema, not an issue per pulm (10/2013)  . History of pyelonephritis 05/2012  . HTN (hypertension)   . Hx of migraines   . Knee pain, right   . Leukocytosis, unspecified   . Lumbar disc disease with radiculopathy    multilevel spondylitic changes, scoliosis, and anterolisthesis L4 not thought to currently be surg candidate (Dr. Phoebe Perch, [facility]) (Dr. Ollen Bowl, [place])  . Nicotine addiction   . Nonspecific abnormal electrocardiogram (ECG) (EKG)   . Obesity   . Pure hyperglyceridemia   . RSV (respiratory syncytial virus pneumonia) 01/2020   hospitalization  . Suicide attempt (HCC) 06/2001  . Swelling of limb   . Urge and stress incontinence 07/2012   ([facility])    Past Surgical  History: Past Surgical History:  Procedure Laterality Date  . BUBBLE STUDY  04/02/2020   Procedure: BUBBLE STUDY;  Surgeon: Vesta MixerNahser, Philip J, MD;  [facility];  Service: Cardiovascular;;  . CHOLECYSTECTOMY  07/1982  . CT MAXILLOFACIAL WO/W CM  06/14/06   Left max mucocele  . CT of chest  06/14/2006   Normal except c/w active inflammation / infection.  Left renal atrophy  . ERCP / sphincterotomy - stenosis  01/15/06  . IR ANGIO INTRA EXTRACRAN SEL COM CAROTID INNOMINATE BILAT MOD SED  02/09/2020  . IR ANGIO VERTEBRAL SEL SUBCLAVIAN INNOMINATE UNI L MOD SED  02/09/2020  . IR ANGIO VERTEBRAL SEL VERTEBRAL UNI R MOD SED  02/09/2020  . IR ANGIO VERTEBRAL SEL VERTEBRAL UNI R MOD SED  02/13/2020  . IR US GUIDE VASC  ACCESS RIGHT  02/09/2020  . IR US GUIDE VASC ACCESS RIGHT  02/13/2020  . LOOP RECORDER INSERTION N/A 04/02/2020   Procedure: LOOP RECORDER INSERTION;  Surgeon: Marinus Mawaylor, Gregg W, MD;  [facility];  Service: Cardiovascular;  Laterality: N/A;  . lumbar MRI  11/2010   multilevel spondylitic changes with upper lumbar scoliosis and anterolisthesis of L4 on 5 with some L foraminal narrowing esp at L/3 with concavity of scoliosis, some central stenosis and biforaminal narrowing at L4/5 with spondylolisthesis  . [facility] - Pneumonia, acute asthma, left maxillary sinusitis  01/11 - 06/18/2006  . RADIOLOGY WITH ANESTHESIA N/A 02/13/2020   Procedure: Carotid Stent;  Surgeon: Julieanne Cottoneveshwar, Sanjeev, MD;  Location: [facility] OR;  Service: Radiology;  Laterality: N/A;  . Retained stone     ERCP secondary to retained stone  . Right knee surgery  1984,1989,1989,1991,1994   Reconstructions for ACL insuff  . TEE WITHOUT CARDIOVERSION N/A 04/02/2020   Procedure: TRANSESOPHAGEAL ECHOCARDIOGRAM (TEE);  Surgeon: Elease HashimotoNahser, Deloris PingPhilip J, MD;  [facility];  Service: Cardiovascular;  Laterality: N/A;  . TUBAL LIGATION      Current Medications: Current Meds  Medication Sig  . albuterol (PROVENTIL) (2.5 MG/3ML) 0.083% nebulizer solution INHALE 1 VIAL VIA NEBULIZER EVERY 6 HOURS AS NEEDED FOR WHEEZING/SHORTNESS OF BREATH  . ARIPiprazole (ABILIFY) 5 MG tablet TAKE 1 TABLET BY MOUTH EVERY DAY (Patient taking differently: Take 5 mg by mouth daily.)  . aspirin EC 81 MG EC tablet Take 1 tablet (81 mg total) by mouth daily. Swallow whole.  Marland Kitchen. atorvastatin (LIPITOR) 40 MG tablet Take 1 tablet (40 mg total) by mouth at bedtime.  . benzonatate (TESSALON) 200 MG capsule Take 1 capsule (200 mg total) by mouth 3 (three) times daily as needed for cough. Swallow whole, to not bite pill  . Budeson-Glycopyrrol-Formoterol (BREZTRI AEROSPHERE) 160-9-4.8 MCG/ACT AERO Inhale 2 puffs into the lungs 2 (two) times daily.  . cetirizine  (ZYRTEC) 10 MG tablet Take 10 mg by mouth at bedtime.  Marland Kitchen. dextromethorphan-guaiFENesin (MUCINEX DM) 30-600 MG 12hr tablet Take 1 tablet by mouth 2 (two) times daily as needed for cough.  . empagliflozin (JARDIANCE) 10 MG TABS tablet Take 1 tablet (10 mg total) by mouth daily before breakfast.  . escitalopram (LEXAPRO) 20 MG tablet TAKE 1 AND 1/2 TABLETS DAILY BY MOUTH (Patient taking differently: Take 30 mg by mouth daily.)  . famotidine (PEPCID) 20 MG tablet TAKE 1 TABLET BY MOUTH EVERYDAY AT BEDTIME (Patient taking differently: Take 20 mg by mouth at bedtime.)  . fluticasone (FLONASE) 50 MCG/ACT nasal spray Place 1 spray into both nostrils 2 (two) times daily. (Patient taking differently: Place 2 sprays  into both nostrils daily.)  . gabapentin (NEURONTIN) 300 MG capsule TAKE 2 TABLETS IN THE MORNING AND 3 TABLETS AT NIGHT (Patient taking differently: Take 600-900 mg by mouth See admin instructions. TAKE 2 TABLETS (600 MG) BY MOUTH IN THE MORNING AND 3 TABLETS (900 MG) BY MOUTH AT NIGHT)  . LORazepam (ATIVAN) 0.5 MG tablet TAKE 1/2 TO 1 TABLET BY MOUTH TWICE A DAY AS NEEDED FOR ANXIETY  . metFORMIN (GLUCOPHAGE) 500 MG tablet TAKE 1 TABLET BY MOUTH EVERYDAY AT BEDTIME (Patient taking differently: Take 500 mg by mouth daily with breakfast.)  . Multiple Vitamin (MULTIVITAMIN WITH MINERALS) TABS tablet Take 1 tablet by mouth daily.  Marland Kitchen nystatin (MYCOSTATIN) 100000 UNIT/ML suspension TAKE 1 TEASPOONFUL BY MOUTH 3 TIMES DAILY AS NEEDED FOR MOUTH SORES. (Patient taking differently: Take 5 mLs by mouth 3 (three) times daily as needed (mouth sores).)  . PROAIR RESPICLICK 108 (90 Base) MCG/ACT AEPB INHALE 2 PUFFS INTO THE LUNGS EVERY 6 HOURS AS NEEDED FOR DYSPNEA/WHEEZE (Patient taking differently: Inhale 2 puffs into the lungs every 6 (six) hours as needed (dyspnea/wheezing.).)  . promethazine-dextromethorphan (PROMETHAZINE-DM) 6.25-15 MG/5ML syrup Take 5 mLs by mouth 4 (four) times daily as needed for cough.  Marland Kitchen  spironolactone (ALDACTONE) 25 MG tablet Take 0.5 tablets (12.5 mg total) by mouth daily.  . [DISCONTINUED] ENTRESTO 24-26 MG TAKE 1 TABLET BY MOUTH TWICE A DAY  . [DISCONTINUED] furosemide (LASIX) 20 MG tablet Take 1 tablet (20 mg total) by mouth daily.  . [DISCONTINUED] metoprolol succinate (TOPROL-XL) 25 MG 24 hr tablet Take 1 tablet (25 mg total) by mouth daily.     Allergies:    Metolazone   Social History: Social History   Socioeconomic History  . Marital status: Widowed    Spouse name: Not on file  . Number of children: 3  . Years of education: Not on file  . Highest education level: Not on file  Occupational History  . Occupation: Administrator, arts: [employer]     Employer: [employer]   Tobacco Use  . Smoking status: Current Some Day Smoker    Years: 41.00    Types: Cigarettes  . Smokeless tobacco: Never Used  . Tobacco comment: 3 cigs per day 05/29/19 lmr  Vaping Use  . Vaping Use: Never used  Substance and Sexual Activity  . Alcohol use: Yes    Alcohol/week: 0.0 standard drinks    Comment: occasional  . Drug use: No  . Sexual activity: Not on file  Other Topics Concern  . Not on file  Social History Narrative   Lives with husband and son and daughter in [place] and grandchild   Occupation: Media planner at [employer]   Activity: limited by dyspnea   Diet: good water, fruits/vegetables daily   Social Determinants of Health   Financial Resource Strain: Not on file  Food Insecurity: Not on file  Transportation Needs: Not on file  Physical Activity: Not on file  Stress: Not on file  Social Connections: Not on file     Family History: The patient's family history includes Asthma in her father; Breast cancer in her paternal grandmother; Heart disease in her brother and mother; Hyperlipidemia in her mother; Hypertension in her father and mother.  ROS:   All other ROS reviewed and negative. Pertinent positives  noted in the HPI.     EKGs/Labs/Other Studies Reviewed:   The following studies were personally reviewed by me today:  TTE 01/31/2020  1. Technically difficult study, even with Definity contrast. There is no  left ventricular thrombus. There is relatively preserved systolic function  in the basal segments. Findings suggest stress cardiomyopathy (takotsubo  sd.), but could well represent  multivessel CAD. Left ventricular ejection fraction, by estimation, is 30  to 35%. The left ventricle has moderate to severely decreased function.  The left ventricle demonstrates global hypokinesis. The left ventricular  internal cavity size was mildly  dilated. Left ventricular diastolic parameters are consistent with Grade  II diastolic dysfunction (pseudonormalization). Elevated left atrial  pressure.  2. Right ventricular systolic function was not well visualized. The right  ventricular size is normal. Tricuspid regurgitation signal is inadequate  for assessing PA pressure.  3. The mitral valve is grossly normal. No evidence of mitral valve  regurgitation. No evidence of mitral stenosis.  4. The aortic valve is grossly normal. Aortic valve regurgitation is not  visualized. No aortic stenosis is present.   Recent Labs: 01/30/2020: B Natriuretic Peptide 47.7 02/07/2020: ALT 126 02/08/2020: TSH 0.821 03/06/2020: Hemoglobin 11.2; Platelets 319 04/18/2020: BUN 17; Creatinine, Ser 1.08; Potassium 4.4; Sodium 142   Recent Lipid Panel    Component Value Date/Time   CHOL 117 02/09/2020 0214   TRIG 139 02/09/2020 0214   HDL 37 (L) 02/09/2020 0214   CHOLHDL 3.2 02/09/2020 0214   VLDL 28 02/09/2020 0214   LDLCALC 52 02/09/2020 0214    Physical Exam:   VS:  BP 125/70   Pulse 63   Temp (!) 97.3 F (36.3 C)   Ht 5' 1.5" (1.562 m)   Wt 230 lb 9.6 oz (104.6 kg)   LMP 03/08/2013   SpO2 99%   BMI 42.87 kg/m    Wt Readings from Last 3 Encounters:  06/10/20 230 lb 9.6 oz (104.6 kg)  04/29/20 232 lb  (105.2 kg)  04/09/20 226 lb (102.5 kg)    General: Well nourished, well developed, in no acute distress Head: Atraumatic, normal size  Eyes: PEERLA, EOMI  Neck: Supple, no JVD Endocrine: No thryomegaly Cardiac: Normal S1, S2; RRR; no murmurs, rubs, or gallops Lungs: Diffuse wheezing bilaterally, no crackles, rhonchi as well Abd: Soft, nontender, no hepatomegaly  Ext: No edema, pulses 2+ Musculoskeletal: No deformities, BUE and BLE strength normal and equal Skin: Warm and dry, no rashes   Neuro: Alert and oriented to person, place, time, and situation, CNII-XII grossly intact, no focal deficits  Psych: Normal mood and affect   ASSESSMENT:   Brenda [name] is a 60 y.o. female who presents for the following: 1. Takotsubo cardiomyopathy   2. Chronic systolic heart failure (HCC)   3. Primary hypertension   4. Hyperlipidemia LDL goal <70   5. SVT (supraventricular tachycardia) (HCC)   6. Obesity, morbid, BMI 40.0-49.9 (HCC)   7. Tobacco abuse     PLAN:   1. Takotsubo cardiomyopathy 2. Chronic systolic heart failure [facility]) -Admitted in August 2021 with a COPD exacerbation.  Had a non-STEMI and reduced EF.  Echocardiogram was consistent with a stress-induced cardiomyopathy.  EF has steadily recovered. -She is on metoprolol and Entresto.  Add Aldactone 12.5 mg daily and Jardiance 10 mg daily.  She is on metoprolol succinate 25 mg a day.  She is on Entresto 24-26 mg twice daily.  We will further titrate her Entresto pending her echocardiogram. -I would like for her to repeat an echocardiogram and see me back in 3 months. -She is euvolemic on examination.  Continue Lasix 20 mg a day.  3. Primary hypertension -Well-controlled on heart failure regimen.  4. Hyperlipidemia LDL goal <70 -Most recent LDL cholesterol 52.  She is diabetic she has had a stroke.  She is at goal.  5. SVT (supraventricular tachycardia) (HCC) -No recurrence.  6. Obesity, morbid, BMI 40.0-49.9 (HCC) -Weight loss  and exercise encouraged  7. Tobacco abuse -Still smoking 4 to 5 cigarettes/day.  Advised to quit.  She has COPD.  She requires home oxygen.   Disposition: No follow-ups on file.  Medication Adjustments/Labs and Tests Ordered: Current medicines are reviewed at length with the patient today.  Concerns regarding medicines are outlined above.  Orders Placed This Encounter  Procedures  . Basic metabolic panel  . ECHOCARDIOGRAM COMPLETE   Meds ordered this encounter  Medications  . empagliflozin (JARDIANCE) 10 MG TABS tablet    Sig: Take 1 tablet (10 mg total) by mouth daily before breakfast.    Dispense:  90 tablet    Refill:  3  . spironolactone (ALDACTONE) 25 MG tablet    Sig: Take 0.5 tablets (12.5 mg total) by mouth daily.    Dispense:  45 tablet    Refill:  3  . furosemide (LASIX) 20 MG tablet    Sig: Take 1 tablet (20 mg total) by mouth daily.    Dispense:  90 tablet    Refill:  1    Dose change  . sacubitril-valsartan (ENTRESTO) 24-26 MG    Sig: Take 1 tablet by mouth 2 (two) times daily.    Dispense:  180 tablet    Refill:  1  . metoprolol succinate (TOPROL-XL) 25 MG 24 hr tablet    Sig: Take 1 tablet (25 mg total) by mouth daily.    Dispense:  90 tablet    Refill:  1    Patient Instructions  Medication Instructions:  START spironolactone 12.5mg  daily START jardiance 10mg  daily CONTINUE all other current medications  *If you need a refill on your cardiac medications before your next appointment, please call your pharmacy*   Lab Work: BMET today   If you have labs (blood work) drawn today and your tests are completely normal, you will receive your results only by: MyChart Message (if you have MyChart) OR . A paper copy in the mail If you have any lab test that is abnormal or we need to change your treatment, we will call you to review the results.   Testing/Procedures: Your physician has requested that you have an echocardiogram. Echocardiography is a  painless test that uses sound waves to create images of your heart. It provides your doctor with information about the size and shape of your heart and how well your heart's chambers and valves are working. This procedure takes approximately one hour. There are no restrictions for this procedure.     Follow-Up: At [facility], you and your health needs are our priority.  As part of our continuing mission to provide you with exceptional heart care, we have created designated Provider Care Teams.  These Care Teams include your primary Cardiologist (physician) and Advanced Practice Providers (APPs -  Physician Assistants and Nurse Practitioners) who all work together to provide you with the care you need, when you need it.  We recommend signing up for the patient portal called "MyChart".  Sign up information is provided on this After Visit Summary.  MyChart is used to connect with patients for Virtual Visits (Telemedicine).  Patients are able to view lab/test results, encounter notes, upcoming appointments, etc.  Non-urgent messages can be sent to your provider as well.   To learn more about what you can do with MyChart, go to [email or web address].    Your next appointment:   3 month(s)  The format for your next appointment:   In Person  Provider:   You may see Reatha HarpsWesley T O'Neal, MD or one of the following Advanced Practice Providers on your designated Care Team:    Azalee CourseHao Meng, PA-C  Micah FlesherAngela Duke, [place]-C or   Tinlee PimpleKrista Kroeger, [place]-C    Other Instructions      Time Spent with Patient: I have spent a total of 35 minutes with patient reviewing hospital notes, telemetry, EKGs, labs and examining the patient as well as establishing an assessment and plan that was discussed with the patient.  > 50% of time was spent in direct patient care.  Signed, Lenna GilfordWesley T. Flora Lipps'Neal, MD [facility]  [facility]  [address], [address] [place], [place] [address] [phone]  06/10/2020 2:25 PM

## 2020-06-10 ENCOUNTER — Encounter: Payer: Self-pay | Admitting: Cardiovascular Disease

## 2020-06-10 ENCOUNTER — Ambulatory Visit (INDEPENDENT_AMBULATORY_CARE_PROVIDER_SITE_OTHER): Payer: Medicare Other | Admitting: Cardiovascular Disease

## 2020-06-10 ENCOUNTER — Ambulatory Visit (INDEPENDENT_AMBULATORY_CARE_PROVIDER_SITE_OTHER): Payer: Medicare Other

## 2020-06-10 ENCOUNTER — Other Ambulatory Visit: Payer: Self-pay

## 2020-06-10 VITALS — BP 125/70 | HR 63 | Temp 97.3°F | Ht 61.5 in | Wt 230.6 lb

## 2020-06-10 DIAGNOSIS — I471 Supraventricular tachycardia, unspecified: Secondary | ICD-10-CM

## 2020-06-10 DIAGNOSIS — G459 Transient cerebral ischemic attack, unspecified: Secondary | ICD-10-CM | POA: Diagnosis not present

## 2020-06-10 DIAGNOSIS — E785 Hyperlipidemia, unspecified: Secondary | ICD-10-CM | POA: Diagnosis not present

## 2020-06-10 DIAGNOSIS — Z720 Tobacco use: Secondary | ICD-10-CM | POA: Diagnosis not present

## 2020-06-10 DIAGNOSIS — I5022 Chronic systolic (congestive) heart failure: Secondary | ICD-10-CM

## 2020-06-10 DIAGNOSIS — I5181 Takotsubo syndrome: Secondary | ICD-10-CM

## 2020-06-10 DIAGNOSIS — I10 Essential (primary) hypertension: Secondary | ICD-10-CM

## 2020-06-10 MED ORDER — ENTRESTO 24-26 MG PO TABS: 1.0000 | ORAL_TABLET | Freq: Two times a day (BID) | ORAL | 1 refills | Status: DC

## 2020-06-10 MED ORDER — SPIRONOLACTONE 25 MG PO TABS: 12.5000 mg | ORAL_TABLET | Freq: Every day | ORAL | 3 refills | Status: DC

## 2020-06-10 MED ORDER — METOPROLOL SUCCINATE ER 25 MG PO TB24: 25.0000 mg | ORAL_TABLET | Freq: Every day | ORAL | 1 refills | Status: DC

## 2020-06-10 MED ORDER — FUROSEMIDE 20 MG PO TABS: 20.0000 mg | ORAL_TABLET | Freq: Every day | ORAL | 1 refills | Status: DC

## 2020-06-10 MED ORDER — EMPAGLIFLOZIN 10 MG PO TABS: 10.0000 mg | ORAL_TABLET | Freq: Every day | ORAL | 3 refills | Status: DC

## 2020-06-10 NOTE — Patient Instructions (Signed)
Medication Instructions:  START spironolactone 12.5mg  daily START jardiance 10mg  daily CONTINUE all other current medications  *If you need a refill on your cardiac medications before your next appointment, please call your pharmacy*   Lab Work: BMET today   If you have labs (blood work) drawn today and your tests are completely normal, you will receive your results only by: MyChart Message (if you have MyChart) OR . A paper copy in the mail If you have any lab test that is abnormal or we need to change your treatment, we will call you to review the results.   Testing/Procedures: Your physician has requested that you have an echocardiogram. Echocardiography is a painless test that uses sound waves to create images of your heart. It provides your doctor with information about the size and shape of your heart and how well your heart's chambers and valves are working. This procedure takes approximately one hour. There are no restrictions for this procedure.     Follow-Up: At [facility], you and your health needs are our priority.  As part of our continuing mission to provide you with exceptional heart care, we have created designated Provider Care Teams.  These Care Teams include your primary Cardiologist (physician) and Advanced Practice Providers (APPs -  Physician Assistants and Nurse Practitioners) who all work together to provide you with the care you need, when you need it.  We recommend signing up for the patient portal called "MyChart".  Sign up information is provided on this After Visit Summary.  MyChart is used to connect with patients for Virtual Visits (Telemedicine).  Patients are able to view lab/test results, encounter notes, upcoming appointments, etc.  Non-urgent messages can be sent to your provider as well.   To learn more about what you can do with MyChart, go to [facility].    Your next appointment:   3 month(s)  The format for your next  appointment:   In Person  Provider:   You may see [email or web address], MD or one of the following Advanced Practice Providers on your designated Care Team:    Reatha Harps, PA-C  Azalee Course, Micah Flesher or   [place], Kryslyn Pimple    Other Instructions

## 2020-06-11 LAB — BASIC METABOLIC PANEL
BUN/Creatinine Ratio: 10 — ABNORMAL LOW (ref 12–28)
BUN: 11 mg/dL (ref 8–27)
CO2: 27 mmol/L (ref 20–29)
Calcium: 9.6 mg/dL (ref 8.7–10.3)
Chloride: 101 mmol/L (ref 96–106)
Creatinine, Ser: 1.07 mg/dL — ABNORMAL HIGH (ref 0.57–1.00)
GFR calc Af Amer: 65 mL/min/{1.73_m2} (ref >59)
GFR calc non Af Amer: 56 mL/min/{1.73_m2} — ABNORMAL LOW (ref >59)
Glucose: 102 mg/dL — ABNORMAL HIGH (ref 65–99)
Potassium: 4.8 mmol/L (ref 3.5–5.2)
Sodium: 142 mmol/L (ref 134–144)

## 2020-06-25 NOTE — Progress Notes (Signed)
Carelink Summary Report / Loop Recorder 

## 2020-06-28 ENCOUNTER — Other Ambulatory Visit: Payer: Self-pay

## 2020-06-28 ENCOUNTER — Ambulatory Visit (HOSPITAL_COMMUNITY): Payer: Medicare Other | Attending: Cardiology

## 2020-06-28 DIAGNOSIS — I5181 Takotsubo syndrome: Secondary | ICD-10-CM | POA: Diagnosis not present

## 2020-06-28 DIAGNOSIS — I471 Supraventricular tachycardia: Secondary | ICD-10-CM | POA: Diagnosis not present

## 2020-06-28 LAB — ECHOCARDIOGRAM COMPLETE
Area-P 1/2: 2.62 cm2
S' Lateral: 3.3 cm

## 2020-06-28 MED ORDER — PERFLUTREN LIPID MICROSPHERE: 1.0000 mL | INTRAVENOUS | Status: DC | PRN

## 2020-07-04 ENCOUNTER — Other Ambulatory Visit: Payer: Self-pay | Admitting: Family Medicine

## 2020-07-04 ENCOUNTER — Other Ambulatory Visit: Payer: Self-pay | Admitting: Internal Medicine

## 2020-07-04 DIAGNOSIS — J449 Chronic obstructive pulmonary disease, unspecified: Secondary | ICD-10-CM

## 2020-07-04 NOTE — Telephone Encounter (Signed)
ERx 

## 2020-07-04 NOTE — Telephone Encounter (Signed)
Name of Medication: Lorazepam Name of Pharmacy: [facility] [address] Last Fill or Written Date and Quantity: 05/27/20, #30 Last Office Visit and Type: 02/23/20, hosp f/u Next Office Visit and Type: none Last Controlled Substance Agreement Date: none Last UDS: none

## 2020-07-08 DIAGNOSIS — G4733 Obstructive sleep apnea (adult) (pediatric): Secondary | ICD-10-CM | POA: Diagnosis not present

## 2020-07-09 ENCOUNTER — Other Ambulatory Visit (HOSPITAL_COMMUNITY): Payer: Self-pay

## 2020-07-09 ENCOUNTER — Other Ambulatory Visit (HOSPITAL_COMMUNITY): Payer: Self-pay | Admitting: Interventional Radiology

## 2020-07-09 DIAGNOSIS — I771 Stricture of artery: Secondary | ICD-10-CM

## 2020-07-09 DIAGNOSIS — G4733 Obstructive sleep apnea (adult) (pediatric): Secondary | ICD-10-CM | POA: Diagnosis not present

## 2020-07-11 LAB — CUP PACEART REMOTE DEVICE CHECK
Date Time Interrogation Session: 20220209170333
Implantable Pulse Generator Implant Date: 20211102

## 2020-07-15 ENCOUNTER — Ambulatory Visit (INDEPENDENT_AMBULATORY_CARE_PROVIDER_SITE_OTHER): Payer: Medicare Other

## 2020-07-15 DIAGNOSIS — G459 Transient cerebral ischemic attack, unspecified: Secondary | ICD-10-CM

## 2020-07-18 NOTE — Progress Notes (Signed)
Carelink Summary Report / Loop Recorder 

## 2020-07-22 ENCOUNTER — Other Ambulatory Visit: Payer: Self-pay

## 2020-07-22 ENCOUNTER — Telehealth (HOSPITAL_COMMUNITY): Payer: Self-pay

## 2020-07-22 ENCOUNTER — Ambulatory Visit (HOSPITAL_COMMUNITY)
Admission: RE | Admit: 2020-07-22 | Discharge: 2020-07-22 | Disposition: A | Discharge status: Home / Self Care | Payer: Medicare Other | Source: Ambulatory Visit | Attending: Interventional Radiology | Admitting: Interventional Radiology

## 2020-07-22 DIAGNOSIS — I771 Stricture of artery: Secondary | ICD-10-CM | POA: Diagnosis not present

## 2020-07-22 DIAGNOSIS — J3489 Other specified disorders of nose and nasal sinuses: Secondary | ICD-10-CM | POA: Diagnosis not present

## 2020-07-22 DIAGNOSIS — I6523 Occlusion and stenosis of bilateral carotid arteries: Secondary | ICD-10-CM | POA: Diagnosis not present

## 2020-07-22 DIAGNOSIS — I651 Occlusion and stenosis of basilar artery: Secondary | ICD-10-CM | POA: Diagnosis not present

## 2020-07-22 DIAGNOSIS — I672 Cerebral atherosclerosis: Secondary | ICD-10-CM | POA: Diagnosis not present

## 2020-07-22 LAB — POCT I-STAT CREATININE: Creatinine, Ser: 1.1 mg/dL — ABNORMAL HIGH (ref 0.44–1.00)

## 2020-07-22 MED ORDER — IOHEXOL 350 MG/ML SOLN
75.0000 mL | Freq: Once | INTRAVENOUS | Status: AC | PRN
  Administered 2020-07-22: 75 mL via INTRAVENOUS

## 2020-07-22 NOTE — Telephone Encounter (Signed)
Pt agreed to f/u in 6 months with cta head/neck. She is not currently having any symptoms. AW

## 2020-07-25 ENCOUNTER — Other Ambulatory Visit: Payer: Self-pay | Admitting: Internal Medicine

## 2020-08-09 ENCOUNTER — Other Ambulatory Visit: Payer: Self-pay | Admitting: Family Medicine

## 2020-08-09 ENCOUNTER — Other Ambulatory Visit: Payer: Self-pay | Admitting: Internal Medicine

## 2020-08-09 NOTE — Telephone Encounter (Signed)
Name of Medication: Lorazepam Name of Pharmacy: [facility] [address] Last Fill or Written Date and Quantity: 07/04/20, #30 Last Office Visit and Type: 02/23/20, hosp f/u Next Office Visit and Type: none Last Controlled Substance Agreement Date: none Last UDS: none  Abilify last filled:  06/17/20, #90 Gabapentin last filled:  05/22/20, #450

## 2020-08-12 NOTE — Telephone Encounter (Signed)
ERx 

## 2020-08-15 ENCOUNTER — Ambulatory Visit: Payer: Medicare Other | Admitting: Adult Health

## 2020-08-15 LAB — CUP PACEART REMOTE DEVICE CHECK
Date Time Interrogation Session: 20220314170551
Implantable Pulse Generator Implant Date: 20211102

## 2020-08-19 ENCOUNTER — Ambulatory Visit (INDEPENDENT_AMBULATORY_CARE_PROVIDER_SITE_OTHER): Payer: Medicare Other

## 2020-08-19 DIAGNOSIS — I5022 Chronic systolic (congestive) heart failure: Secondary | ICD-10-CM

## 2020-08-26 NOTE — Progress Notes (Signed)
Carelink Summary Report / Loop Recorder 

## 2020-09-01 ENCOUNTER — Telehealth: Payer: Self-pay | Admitting: Family Medicine

## 2020-09-02 NOTE — Telephone Encounter (Signed)
E-scribed refill.  Plz schedule lab and Welcome to Medicare visit.

## 2020-09-04 NOTE — Telephone Encounter (Signed)
Noted  

## 2020-09-04 NOTE — Telephone Encounter (Signed)
Patient called and scheduled first available physical on 10/01/20 and labs the week before.

## 2020-09-12 NOTE — Progress Notes (Signed)
Cardiology Office Note:   Date:  09/13/2020  NAME:  Brenda [name]    MRN: [id number] DOB:  [birth year]   PCP:  Eustaquio BoydenGutierrez, Javier, MD  Cardiologist:  Reatha HarpsWesley T O'Neal, MD   Referring MD: Eustaquio BoydenGutierrez, Javier, MD   Chief Complaint  Patient presents with  . Congestive Heart Failure        History of Present Illness:   Brenda [name] is a 60 y.o. female with a hx of severe COPD, Takotsubo CM, tobacco abuse, HTN, DM, CVA who presents for follow-up.  Her repeat echocardiogram demonstrates normal LV function.  Given that her EF has normalized she likely has no significant CAD.  Her weights are stable.  Blood pressure 127/82.  She denies any chest pain or shortness of breath.  She is still smoking 4 to 5 cigarettes/day.  She has been advised to quit.  She has severe COPD which does limit her ability to do things.  Her volume status is acceptable.  She has no increased lower extremity edema.  She is taking Lasix 20 mg daily.  Heart failure regimen includes metoprolol succinate 25 mg daily, Entresto 24-26 mg twice daily, Aldactone 12.5 mg daily, Jardiance 10 mg daily.  She is done remarkably well from a cardiovascular standpoint.  Her diabetes is well controlled.  Recent A1c 6.1.  LDL 52.  She does have a loop recorder in place for cryptogenic stroke.  No A. fib has been found.  Her TEE was negative.  Problem List 1. NSTEMI 01/30/2020  -stress induced CM 2. Takotsubo CM  -EF 30-35% with apical akinesis 01/31/2020 in setting of COPD/hypercarbic respiratory failure   -EF 40-45% with improvement in WMA 02/08/2020 -EF 55-60% 06/28/2020 3. COPD -severe COPD -2L 4. Tobacco abuse 5. HTN 6. DM -A1c 6.1 7. HLD -T chol 117, HDL 37, LDL 52, TG 139 8. CVA 01/2020/Cryptogenic  -basilar artery stenosis? -TEE negative -loop recorder placed  9. SVT  Past Medical History: Past Medical History:  Diagnosis Date  . Acute pancreatitis   . Asthma    main dyspnea thought due to deconditioning (10/2013)  . Bipolar I  disorder, most recent episode (or current) manic, unspecified    pt denies this  . Bronchitis, chronic obstructive (HCC) 2008 & 2009   FeV1 64% TLC 105% DLCO 55% 2008  -  FeV1 81% FeF 25-75 43% 2009  . COPD (chronic obstructive pulmonary disease) (HCC)    emphysema, not an issue per pulm (10/2013)  . History of pyelonephritis 05/2012  . HTN (hypertension)   . Hx of migraines   . Knee pain, right   . Leukocytosis, unspecified   . Lumbar disc disease with radiculopathy    multilevel spondylitic changes, scoliosis, and anterolisthesis L4 not thought to currently be surg candidate (Dr. Phoebe PerchHirsch, [facility]) (Dr. Ollen BowlHarkins, [facility])  . Nicotine addiction   . Nonspecific abnormal electrocardiogram (ECG) (EKG)   . Obesity   . Pure hyperglyceridemia   . RSV (respiratory syncytial virus pneumonia) 01/2020   hospitalization  . Suicide attempt (HCC) 06/2001  . Swelling of limb   . Urge and stress incontinence 07/2012   ([facility])    Past Surgical History: Past Surgical History:  Procedure Laterality Date  . BUBBLE STUDY  04/02/2020   Procedure: BUBBLE STUDY;  Surgeon: Vesta MixerNahser, Philip J, MD;  [facility];  Service: Cardiovascular;;  . CHOLECYSTECTOMY  07/1982  . CT MAXILLOFACIAL WO/W CM  06/14/06   Left max mucocele  . CT of chest  06/14/2006  Normal except c/w active inflammation / infection.  Left renal atrophy  . ERCP / sphincterotomy - stenosis  01/15/06  . IR ANGIO INTRA EXTRACRAN SEL COM CAROTID INNOMINATE BILAT MOD SED  02/09/2020  . IR ANGIO VERTEBRAL SEL SUBCLAVIAN INNOMINATE UNI L MOD SED  02/09/2020  . IR ANGIO VERTEBRAL SEL VERTEBRAL UNI R MOD SED  02/09/2020  . IR ANGIO VERTEBRAL SEL VERTEBRAL UNI R MOD SED  02/13/2020  . IR US GUIDE VASC ACCESS RIGHT  02/09/2020  . IR US GUIDE VASC ACCESS RIGHT  02/13/2020  . LOOP RECORDER INSERTION N/A 04/02/2020   Procedure: LOOP RECORDER INSERTION;  Surgeon: Marinus Maw, MD;  Location[facility] INVASIVE CV LAB;  Service: Cardiovascular;   Laterality: N/A;  . lumbar MRI  11/2010   multilevel spondylitic changes with upper lumbar scoliosis and anterolisthesis of L4 on 5 with some L foraminal narrowing esp at L/3 with concavity of scoliosis, some central stenosis and biforaminal narrowing at L4/5 with spondylolisthesis  . [place] - Pneumonia, acute asthma, left maxillary sinusitis  01/11 - 06/18/2006  . RADIOLOGY WITH ANESTHESIA N/A 02/13/2020   Procedure: Carotid Stent;  Surgeon: Julieanne Cotton, MD;  Location: [facility] OR;  Service: Radiology;  Laterality: N/A;  . Retained stone     ERCP secondary to retained stone  . Right knee surgery  1984,1989,1989,1991,1994   Reconstructions for ACL insuff  . TEE WITHOUT CARDIOVERSION N/A 04/02/2020   Procedure: TRANSESOPHAGEAL ECHOCARDIOGRAM (TEE);  Surgeon: Elease Hashimoto Deloris Ping, MD;  Location: [facility] ENDOSCOPY;  Service: Cardiovascular;  Laterality: N/A;  . TUBAL LIGATION      Current Medications: Current Meds  Medication Sig  . albuterol (PROVENTIL) (2.5 MG/3ML) 0.083% nebulizer solution INHALE 1 VIAL VIA NEBULIZER EVERY 6 HOURS AS NEEDED FOR WHEEZING/SHORTNESS OF BREATH  . ARIPiprazole (ABILIFY) 5 MG tablet TAKE 1 TABLET BY MOUTH EVERY DAY  . aspirin EC 81 MG EC tablet Take 1 tablet (81 mg total) by mouth daily. Swallow whole.  . benzonatate (TESSALON) 200 MG capsule Take 1 capsule (200 mg total) by mouth 3 (three) times daily as needed for cough. Swallow whole, to not bite pill  . BREZTRI AEROSPHERE 160-9-4.8 MCG/ACT AERO INHALE 2 PUFFS INTO LUNGS 2 TIMES DAILY  . cetirizine (ZYRTEC) 10 MG tablet Take 10 mg by mouth at bedtime.  Marland Kitchen dextromethorphan-guaiFENesin (MUCINEX DM) 30-600 MG 12hr tablet Take 1 tablet by mouth 2 (two) times daily as needed for cough.  . empagliflozin (JARDIANCE) 10 MG TABS tablet Take 1 tablet (10 mg total) by mouth daily before breakfast.  . escitalopram (LEXAPRO) 20 MG tablet TAKE 1 AND 1/2 TABLETS BY MOUTH DAILY  . famotidine (PEPCID) 20 MG tablet TAKE 1 TABLET BY  MOUTH EVERYDAY AT BEDTIME (Patient taking differently: Take 20 mg by mouth at bedtime.)  . fluticasone (FLONASE) 50 MCG/ACT nasal spray PLACE 1 SPRAY INTO BOTH NOSTRILS 2 (TWO) TIMES DAILY.  . furosemide (LASIX) 20 MG tablet Take 1 tablet (20 mg total) by mouth daily.  Marland Kitchen gabapentin (NEURONTIN) 300 MG capsule TAKE 2 TABLETS IN THE MORNING AND 3 TABLETS AT NIGHT  . LORazepam (ATIVAN) 0.5 MG tablet TAKE 1/2 TO 1 TABLET BY MOUTH TWICE A DAY AS NEEDED FOR ANXIETY  . metFORMIN (GLUCOPHAGE) 500 MG tablet TAKE 1 TABLET BY MOUTH EVERYDAY AT BEDTIME (Patient taking differently: Take 500 mg by mouth daily with breakfast.)  . metoprolol succinate (TOPROL-XL) 25 MG 24 hr tablet Take 1 tablet (25 mg total) by mouth daily.  . Multiple  Vitamin (MULTIVITAMIN WITH MINERALS) TABS tablet Take 1 tablet by mouth daily.  Marland Kitchen nystatin (MYCOSTATIN) 100000 UNIT/ML suspension TAKE 1 TEASPOONFUL BY MOUTH 3 TIMES DAILY AS NEEDED FOR MOUTH SORES. (Patient taking differently: Take 5 mLs by mouth 3 (three) times daily as needed (mouth sores).)  . PROAIR RESPICLICK 108 (90 Base) MCG/ACT AEPB INHALE 2 PUFFS INTO THE LUNGS EVERY 6 HOURS AS NEEDED FOR DYSPNEA/WHEEZE  . promethazine-dextromethorphan (PROMETHAZINE-DM) 6.25-15 MG/5ML syrup Take 5 mLs by mouth 4 (four) times daily as needed for cough.  . sacubitril-valsartan (ENTRESTO) 24-26 MG Take 1 tablet by mouth 2 (two) times daily.  . [DISCONTINUED] atorvastatin (LIPITOR) 40 MG tablet Take 1 tablet (40 mg total) by mouth at bedtime.     Allergies:    Metolazone   Social History: Social History   Socioeconomic History  . Marital status: Widowed    Spouse name: Not on file  . Number of children: 3  . Years of education: Not on file  . Highest education level: Not on file  Occupational History  . Occupation: Administrator, arts: [employer]     Employer: [employer]   Tobacco Use  . Smoking status: Current Some Day Smoker    Years: 41.00    Types:  Cigarettes  . Smokeless tobacco: Never Used  . Tobacco comment: 3 cigs per day 05/29/19 lmr  Vaping Use  . Vaping Use: Never used  Substance and Sexual Activity  . Alcohol use: Yes    Alcohol/week: 0.0 standard drinks    Comment: occasional  . Drug use: No  . Sexual activity: Not on file  Other Topics Concern  . Not on file  Social History Narrative   Lives with husband and son and daughter in [place] and grandchild   Occupation: Media planner at [employer]   Activity: limited by dyspnea   Diet: good water, fruits/vegetables daily   Social Determinants of Health   Financial Resource Strain: Not on file  Food Insecurity: Not on file  Transportation Needs: Not on file  Physical Activity: Not on file  Stress: Not on file  Social Connections: Not on file     Family History: The patient's family history includes Asthma in her father; Breast cancer in her paternal grandmother; Heart disease in her brother and mother; Hyperlipidemia in her mother; Hypertension in her father and mother.  ROS:   All other ROS reviewed and negative. Pertinent positives noted in the HPI.     EKGs/Labs/Other Studies Reviewed:   The following studies were personally reviewed by me today:  TTE 06/28/2020 1. Left ventricular ejection fraction, by estimation, is 55 to 60%. The  left ventricle has normal function. The left ventricle has no regional  wall motion abnormalities. Left ventricular diastolic parameters are  consistent with Grade I diastolic  dysfunction (impaired relaxation).  2. Right ventricular systolic function is normal. The right ventricular  size is normal.  3. The mitral valve is normal in structure. No evidence of mitral valve  regurgitation. No evidence of mitral stenosis.  4. The aortic valve is normal in structure. Aortic valve regurgitation is  not visualized. No aortic stenosis is present.  5. The inferior vena cava is normal in size with greater than  50%  respiratory variability, suggesting right atrial pressure of 3 mmHg.  Recent Labs: 01/30/2020: B Natriuretic Peptide 47.7 02/07/2020: ALT 126 02/08/2020: TSH 0.821 03/06/2020: Hemoglobin 11.2; Platelets 319 06/10/2020: BUN 11; Potassium 4.8; Sodium 142 07/22/2020: Creatinine,  Ser 1.10   Recent Lipid Panel    Component Value Date/Time   CHOL 117 02/09/2020 0214   TRIG 139 02/09/2020 0214   HDL 37 (L) 02/09/2020 0214   CHOLHDL 3.2 02/09/2020 0214   VLDL 28 02/09/2020 0214   LDLCALC 52 02/09/2020 0214    Physical Exam:   VS:  BP 127/82   Pulse 73   Ht 5' (1.524 m)   Wt 230 lb 6.4 oz (104.5 kg)   LMP 03/08/2013   SpO2 97%   BMI 45.00 kg/m    Wt Readings from Last 3 Encounters:  09/13/20 230 lb 6.4 oz (104.5 kg)  06/10/20 230 lb 9.6 oz (104.6 kg)  04/29/20 232 lb (105.2 kg)    General: Well nourished, well developed, in no acute distress Head: Atraumatic, normal size  Eyes: PEERLA, EOMI  Neck: Supple, no JVD Endocrine: No thryomegaly Cardiac: Normal S1, S2; RRR; no murmurs, rubs, or gallops Lungs: Clear to auscultation bilaterally, no wheezing, rhonchi or rales  Abd: Soft, nontender, no hepatomegaly  Ext: No edema, pulses 2+ Musculoskeletal: No deformities, BUE and BLE strength normal and equal Skin: Warm and dry, no rashes   Neuro: Alert and oriented to person, place, time, and situation, CNII-XII grossly intact, no focal deficits  Psych: Normal mood and affect   ASSESSMENT:   Brenda [name] is a 60 y.o. female who presents for the following: 1. Chronic systolic heart failure (HCC)   2. Takotsubo cardiomyopathy   3. TIA (transient ischemic attack)   4. Hyperlipidemia LDL goal <70   5. Tobacco abuse     PLAN:   1. Chronic systolic heart failure (HCC) 2. Takotsubo cardiomyopathy -She was admitted to the hospital in September 2021.  This was for COPD exacerbation and hypercarbic respiratory failure.  EF was reduced around 30% with regional wall motion abnormalities  consistent with a Takotsubo cardiomyopathy.  She was treated with guideline directed medical therapy and her EF has normalized.  She no longer has wall motion abnormalities. -Her EF has recovered.  I would recommend to continue current heart failure medications including metoprolol succinate 25 mg daily, Entresto 24-26 mg twice daily, Aldactone 12.5 mg daily, Jardiance 10 mg daily. -She is euvolemic and should continue Lasix 20 mg daily. -I see no need for ischemia evaluation as her EF has recovered.  She is euvolemic and doing well.  We will continue to see her yearly.  3. TIA (transient ischemic attack) 4. Hyperlipidemia LDL goal <70 -History of stroke.  TEE was negative.  Loop recorder in place.  No A. fib.  She will continue aspirin 81 mg daily.  She is on Lipitor 40 mg daily.  Most recent LDL cholesterol at goal with a value of 52.  5. Tobacco abuse -Smoking cessation counseling was provided today.   Disposition: Return in about 1 year (around 09/13/2021).  Medication Adjustments/Labs and Tests Ordered: Current medicines are reviewed at length with the patient today.  Concerns regarding medicines are outlined above.  No orders of the defined types were placed in this encounter.  No orders of the defined types were placed in this encounter.   Patient Instructions  Medication Instructions:  The current medical regimen is effective;  continue present plan and medications.  *If you need a refill on your cardiac medications before your next appointment, please call your pharmacy*   Follow-Up: At [facility], you and your health needs are our priority.  As part of our continuing mission to provide you  with exceptional heart care, we have created designated Provider Care Teams.  These Care Teams include your primary Cardiologist (physician) and Advanced Practice Providers (APPs -  Physician Assistants and Nurse Practitioners) who all work together to provide you with the care you need,  when you need it.  We recommend signing up for the patient portal called "MyChart".  Sign up information is provided on this After Visit Summary.  MyChart is used to connect with patients for Virtual Visits (Telemedicine).  Patients are able to view lab/test results, encounter notes, upcoming appointments, etc.  Non-urgent messages can be sent to your provider as well.   To learn more about what you can do with MyChart, go to [email or web address].    Your next appointment:   12 month(s)  The format for your next appointment:   In Person  Provider:   Lennie Odor, MD        Time Spent with Patient: I have spent a total of 35 minutes with patient reviewing hospital notes, telemetry, EKGs, labs and examining the patient as well as establishing an assessment and plan that was discussed with the patient.  > 50% of time was spent in direct patient care.  Signed, Lenna Gilford. Flora Lipps, MD, [facility]  [facility]  [address] [place], [place] [address] [phone]  09/13/2020 8:03 PM

## 2020-09-13 ENCOUNTER — Other Ambulatory Visit: Payer: Self-pay

## 2020-09-13 ENCOUNTER — Encounter: Payer: Self-pay | Admitting: Cardiovascular Disease

## 2020-09-13 ENCOUNTER — Ambulatory Visit (INDEPENDENT_AMBULATORY_CARE_PROVIDER_SITE_OTHER): Payer: Medicare Other | Admitting: Cardiovascular Disease

## 2020-09-13 VITALS — BP 127/82 | HR 73 | Ht 60.0 in | Wt 230.4 lb

## 2020-09-13 DIAGNOSIS — E785 Hyperlipidemia, unspecified: Secondary | ICD-10-CM | POA: Diagnosis not present

## 2020-09-13 DIAGNOSIS — I5181 Takotsubo syndrome: Secondary | ICD-10-CM | POA: Diagnosis not present

## 2020-09-13 DIAGNOSIS — F17210 Nicotine dependence, cigarettes, uncomplicated: Secondary | ICD-10-CM | POA: Diagnosis not present

## 2020-09-13 DIAGNOSIS — Z720 Tobacco use: Secondary | ICD-10-CM

## 2020-09-13 DIAGNOSIS — G459 Transient cerebral ischemic attack, unspecified: Secondary | ICD-10-CM

## 2020-09-13 DIAGNOSIS — I5022 Chronic systolic (congestive) heart failure: Secondary | ICD-10-CM

## 2020-09-13 NOTE — Patient Instructions (Signed)

## 2020-09-18 LAB — CUP PACEART REMOTE DEVICE CHECK
Date Time Interrogation Session: 20220416170431
Implantable Pulse Generator Implant Date: 20211102

## 2020-09-22 ENCOUNTER — Other Ambulatory Visit: Payer: Self-pay | Admitting: Family Medicine

## 2020-09-22 DIAGNOSIS — N1831 Chronic kidney disease, stage 3a: Secondary | ICD-10-CM

## 2020-09-22 DIAGNOSIS — D72829 Elevated white blood cell count, unspecified: Secondary | ICD-10-CM

## 2020-09-22 DIAGNOSIS — E1142 Type 2 diabetes mellitus with diabetic polyneuropathy: Secondary | ICD-10-CM

## 2020-09-22 DIAGNOSIS — E781 Pure hyperglyceridemia: Secondary | ICD-10-CM

## 2020-09-23 ENCOUNTER — Ambulatory Visit (INDEPENDENT_AMBULATORY_CARE_PROVIDER_SITE_OTHER): Payer: Medicare Other

## 2020-09-23 DIAGNOSIS — G459 Transient cerebral ischemic attack, unspecified: Secondary | ICD-10-CM

## 2020-09-24 ENCOUNTER — Other Ambulatory Visit (INDEPENDENT_AMBULATORY_CARE_PROVIDER_SITE_OTHER): Payer: Medicare Other

## 2020-09-24 ENCOUNTER — Other Ambulatory Visit: Payer: Self-pay

## 2020-09-24 DIAGNOSIS — D72829 Elevated white blood cell count, unspecified: Secondary | ICD-10-CM

## 2020-09-24 DIAGNOSIS — E1142 Type 2 diabetes mellitus with diabetic polyneuropathy: Secondary | ICD-10-CM | POA: Diagnosis not present

## 2020-09-24 DIAGNOSIS — N1831 Chronic kidney disease, stage 3a: Secondary | ICD-10-CM | POA: Diagnosis not present

## 2020-09-24 DIAGNOSIS — E781 Pure hyperglyceridemia: Secondary | ICD-10-CM | POA: Diagnosis not present

## 2020-09-24 LAB — COMPREHENSIVE METABOLIC PANEL
ALT: 11 U/L (ref 0–35)
AST: 12 U/L (ref 0–37)
Albumin: 3.6 g/dL (ref 3.5–5.2)
Alkaline Phosphatase: 113 U/L (ref 39–117)
BUN: 18 mg/dL (ref 6–23)
CO2: 32 mEq/L (ref 19–32)
Calcium: 9.4 mg/dL (ref 8.4–10.5)
Chloride: 102 mEq/L (ref 96–112)
Creatinine, Ser: 1 mg/dL (ref 0.40–1.20)
GFR: 60.51 mL/min (ref >60.00)
Glucose, Bld: 102 mg/dL — ABNORMAL HIGH (ref 70–99)
Potassium: 4.5 mEq/L (ref 3.5–5.1)
Sodium: 140 mEq/L (ref 135–145)
Total Bilirubin: 0.3 mg/dL (ref 0.2–1.2)
Total Protein: 6.8 g/dL (ref 6.0–8.3)

## 2020-09-24 LAB — HEMOGLOBIN A1C: Hgb A1c MFr Bld: 5.8 % (ref 4.6–6.5)

## 2020-09-24 LAB — CBC WITH DIFFERENTIAL/PLATELET
Basophils Absolute: 0.1 10*3/uL (ref 0.0–0.1)
Basophils Relative: 0.8 % (ref 0.0–3.0)
Eosinophils Absolute: 0.5 10*3/uL (ref 0.0–0.7)
Eosinophils Relative: 4.7 % (ref 0.0–5.0)
HCT: 40.6 % (ref 36.0–46.0)
Hemoglobin: 13.3 g/dL (ref 12.0–15.0)
Lymphocytes Relative: 30.8 % (ref 12.0–46.0)
Lymphs Abs: 3 10*3/uL (ref 0.7–4.0)
MCHC: 32.8 g/dL (ref 30.0–36.0)
MCV: 90.8 fl (ref 78.0–100.0)
Monocytes Absolute: 0.8 10*3/uL (ref 0.1–1.0)
Monocytes Relative: 7.9 % (ref 3.0–12.0)
Neutro Abs: 5.5 10*3/uL (ref 1.4–7.7)
Neutrophils Relative %: 55.8 % (ref 43.0–77.0)
Platelets: 303 10*3/uL (ref 150.0–400.0)
RBC: 4.47 Mil/uL (ref 3.87–5.11)
RDW: 14.9 % (ref 11.5–15.5)
WBC: 9.9 10*3/uL (ref 4.0–10.5)

## 2020-09-24 LAB — LIPID PANEL
Cholesterol: 161 mg/dL (ref 0–200)
HDL: 40.7 mg/dL (ref >39.00)
LDL Cholesterol: 98 mg/dL (ref 0–99)
NonHDL: 120.1
Total CHOL/HDL Ratio: 4
Triglycerides: 111 mg/dL (ref 0.0–149.0)
VLDL: 22.2 mg/dL (ref 0.0–40.0)

## 2020-09-24 LAB — VITAMIN D 25 HYDROXY (VIT D DEFICIENCY, FRACTURES): VITD: 26.05 ng/mL — ABNORMAL LOW (ref 30.00–100.00)

## 2020-09-24 LAB — PHOSPHORUS: Phosphorus: 3.7 mg/dL (ref 2.3–4.6)

## 2020-09-25 LAB — PARATHYROID HORMONE, INTACT (NO CA): PTH: 38 pg/mL (ref 16–77)

## 2020-09-30 ENCOUNTER — Other Ambulatory Visit: Payer: Self-pay | Admitting: Family Medicine

## 2020-09-30 DIAGNOSIS — Z1231 Encounter for screening mammogram for malignant neoplasm of breast: Secondary | ICD-10-CM

## 2020-10-01 ENCOUNTER — Encounter: Payer: Self-pay | Admitting: Family Medicine

## 2020-10-01 ENCOUNTER — Ambulatory Visit (INDEPENDENT_AMBULATORY_CARE_PROVIDER_SITE_OTHER): Payer: Medicare Other | Admitting: Family Medicine

## 2020-10-01 ENCOUNTER — Other Ambulatory Visit: Payer: Self-pay

## 2020-10-01 VITALS — BP 124/62 | HR 68 | Temp 97.6°F | Ht 60.0 in | Wt 235.5 lb

## 2020-10-01 DIAGNOSIS — G459 Transient cerebral ischemic attack, unspecified: Secondary | ICD-10-CM

## 2020-10-01 DIAGNOSIS — I651 Occlusion and stenosis of basilar artery: Secondary | ICD-10-CM

## 2020-10-01 DIAGNOSIS — E781 Pure hyperglyceridemia: Secondary | ICD-10-CM

## 2020-10-01 DIAGNOSIS — I10 Essential (primary) hypertension: Secondary | ICD-10-CM

## 2020-10-01 DIAGNOSIS — E1142 Type 2 diabetes mellitus with diabetic polyneuropathy: Secondary | ICD-10-CM

## 2020-10-01 DIAGNOSIS — I5042 Chronic combined systolic (congestive) and diastolic (congestive) heart failure: Secondary | ICD-10-CM

## 2020-10-01 DIAGNOSIS — Z1211 Encounter for screening for malignant neoplasm of colon: Secondary | ICD-10-CM

## 2020-10-01 DIAGNOSIS — Z7189 Other specified counseling: Secondary | ICD-10-CM

## 2020-10-01 DIAGNOSIS — Z0000 Encounter for general adult medical examination without abnormal findings: Secondary | ICD-10-CM

## 2020-10-01 DIAGNOSIS — F39 Unspecified mood [affective] disorder: Secondary | ICD-10-CM

## 2020-10-01 DIAGNOSIS — J449 Chronic obstructive pulmonary disease, unspecified: Secondary | ICD-10-CM

## 2020-10-01 DIAGNOSIS — I2781 Cor pulmonale (chronic): Secondary | ICD-10-CM

## 2020-10-01 DIAGNOSIS — Z23 Encounter for immunization: Secondary | ICD-10-CM | POA: Diagnosis not present

## 2020-10-01 DIAGNOSIS — F17210 Nicotine dependence, cigarettes, uncomplicated: Secondary | ICD-10-CM

## 2020-10-01 DIAGNOSIS — J9611 Chronic respiratory failure with hypoxia: Secondary | ICD-10-CM

## 2020-10-01 DIAGNOSIS — D72829 Elevated white blood cell count, unspecified: Secondary | ICD-10-CM

## 2020-10-01 DIAGNOSIS — N1831 Chronic kidney disease, stage 3a: Secondary | ICD-10-CM

## 2020-10-01 MED ORDER — VITAMIN D3 25 MCG (1000 UT) PO CAPS: 1.0000 | ORAL_CAPSULE | Freq: Every day | ORAL | Status: AC

## 2020-10-01 MED ORDER — ATORVASTATIN CALCIUM 40 MG PO TABS: 40.0000 mg | ORAL_TABLET | Freq: Every day | ORAL | 3 refills | Status: DC

## 2020-10-01 NOTE — Progress Notes (Signed)
Patient ID: Brenda [name], female    DOB: February 27, 1961, 60 y.o.   MRN: [id number]  This visit was conducted in person.  BP 124/62   Pulse 68   Temp 97.6 F (36.4 C) (Temporal)   Ht 5' (1.524 m)   Wt 235 lb 8 oz (106.8 kg)   LMP 03/08/2013   SpO2 97% Comment: 2 L  BMI 45.99 kg/m    CC: initial medicare wellness visit  Subjective:   HPI: Brenda [name] is a 60 y.o. female presenting on 10/01/2020 for Medicare Wellness   Did not see health advisor.    Hearing Screening   '125Hz'  '250Hz'  '500Hz'  '1000Hz'  '2000Hz'  '3000Hz'  '4000Hz'  '6000Hz'  '8000Hz'   Right ear:   '20 20 20  20    ' Left ear:   '25 20 20  20      ' Visual Acuity Screening   Right eye Left eye Both eyes  Without correction: '20/30 20/30 20/25 '  With correction:       [place] Visit from 10/01/2020 in [facility] [place]  PHQ-2 Total Score 0      Fall Risk  10/01/2020  Falls in the past year? 0    O2 dependent asthmatic bronchitis/COPD followed by pulm.   2 cigarettes/day - completed chantix. Continues working towards full cessation. Good social support - daughter and her family lives with her.  Hospitalization summer 2021 with NSTEMI vs demand ischemia in setting of RSV pneumonia and COPD exacerbation, then re-hospitalized 01/2020 with acute L sided weakness, slurred speech, n/v - found to have basilar artery stenosis with improvement on its own so no stent placed (TIA) - completed DAPT x3 months. Saw Dr Leonie Man 04/2020, note reviewed - rec aspirin 17m daily. Planned neurology f/u next week.   Sees cardiology - thought 12/2019 event was takotsubo cardiomyopathy not true MI. Released to f/u in 1 year.   Last visit for worsening mood we started abilify 543mdaily and referred to psychiatry in h/o bipolar diagnosis. Didn't go to psychiatrist.   Preventative: Colon cancer screening - iFOB normal 07/2019  Well woman exam -never abnormal cervical screen,normal 08/2014, 09/2015, 07/2019. H/o BTL. Referred to GYN for  abnormal cervical exam - exam normal on their evaluation.  Mammogram -10/2018WNL. Due - to get mammo at local mobile site this weekend.  Lung cancer screening - 30+ PY hx - has had recent lung imaging latest 01/2020  Flu yearly COVID vaccine Pfizdr 01/2020, 03/2020 Pneumovax 2003, 07/2015 Prevnar-20 today Tdap2018. shingrix -discussed. Declines  Advanced directives received, scanned 08/2019. Children JeRenaee MundaDyCamillia Herterre HCPOA. [facility] discussed - she will consider option.  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 Interim medical history since our last visit reviewed. Allergies and medications reviewed and updated. Outpatient Medications Prior to Visit  Medication Sig Dispense Refill  . albuterol (PROVENTIL) (2.5 MG/3ML) 0.083% nebulizer solution INHALE 1 VIAL VIA NEBULIZER EVERY 6 HOURS AS NEEDED FOR WHEEZING/SHORTNESS OF BREATH 150 mL 1  . ARIPiprazole (ABILIFY) 5 MG tablet TAKE 1 TABLET BY MOUTH EVERY DAY 90 tablet 1  . aspirin EC 81 MG  EC tablet Take 1 tablet (81 mg total) by mouth daily. Swallow whole. 30 tablet 0  . benzonatate (TESSALON) 200 MG capsule Take 1 capsule (200 mg total) by mouth 3 (three) times daily as needed for cough. Swallow whole, to not bite pill 45 capsule 2  . BREZTRI AEROSPHERE 160-9-4.8 MCG/ACT AERO INHALE 2 PUFFS INTO LUNGS 2 TIMES DAILY 10.7 g 11  . cetirizine (ZYRTEC) 10 MG tablet Take 10 mg by mouth at bedtime.    Marland Kitchen dextromethorphan-guaiFENesin (MUCINEX DM) 30-600 MG 12hr tablet Take 1 tablet by mouth 2 (two) times daily as needed for cough.    . empagliflozin  (JARDIANCE) 10 MG TABS tablet Take 1 tablet (10 mg total) by mouth daily before breakfast. 90 tablet 3  . escitalopram (LEXAPRO) 20 MG tablet TAKE 1 AND 1/2 TABLETS BY MOUTH DAILY 135 tablet 0  . famotidine (PEPCID) 20 MG tablet TAKE 1 TABLET BY MOUTH EVERYDAY AT BEDTIME (Patient taking differently: Take 20 mg by mouth at bedtime.) 90 tablet 2  . fluticasone (FLONASE) 50 MCG/ACT nasal spray PLACE 1 SPRAY INTO BOTH NOSTRILS 2 (TWO) TIMES DAILY. 48 mL 3  . furosemide (LASIX) 20 MG tablet Take 1 tablet (20 mg total) by mouth daily. 90 tablet 1  . gabapentin (NEURONTIN) 300 MG capsule TAKE 2 TABLETS IN THE MORNING AND 3 TABLETS AT NIGHT 450 capsule 1  . LORazepam (ATIVAN) 0.5 MG tablet TAKE 1/2 TO 1 TABLET BY MOUTH TWICE A DAY AS NEEDED FOR ANXIETY 30 tablet 0  . metFORMIN (GLUCOPHAGE) 500 MG tablet TAKE 1 TABLET BY MOUTH EVERYDAY AT BEDTIME (Patient taking differently: Take 500 mg by mouth daily with breakfast.) 90 tablet 3  . metoprolol succinate (TOPROL-XL) 25 MG 24 hr tablet Take 1 tablet (25 mg total) by mouth daily. 90 tablet 1  . Multiple Vitamin (MULTIVITAMIN WITH MINERALS) TABS tablet Take 1 tablet by mouth daily.    Marland Kitchen nystatin (MYCOSTATIN) 100000 UNIT/ML suspension TAKE 1 TEASPOONFUL BY MOUTH 3 TIMES DAILY AS NEEDED FOR MOUTH SORES. (Patient taking differently: Take 5 mLs by mouth 3 (three) times daily as needed (mouth sores).) 150 mL 1  . PROAIR RESPICLICK 096 (90 Base) MCG/ACT AEPB INHALE 2 PUFFS INTO THE LUNGS EVERY 6 HOURS AS NEEDED FOR DYSPNEA/WHEEZE 2 each 0  . promethazine-dextromethorphan (PROMETHAZINE-DM) 6.25-15 MG/5ML syrup Take 5 mLs by mouth 4 (four) times daily as needed for cough. 240 mL 0  . sacubitril-valsartan (ENTRESTO) 24-26 MG Take 1 tablet by mouth 2 (two) times daily. 180 tablet 1  . spironolactone (ALDACTONE) 25 MG tablet Take 0.5 tablets (12.5 mg total) by mouth daily. 45 tablet 3   No facility-administered medications prior to visit.     Per HPI unless specifically  indicated in ROS section below Review of Systems  Constitutional: Negative for activity change, appetite change, chills, fatigue, fever and unexpected weight change.  HENT: Negative for hearing loss.   Eyes: Negative for visual disturbance.  Respiratory: Positive for cough and shortness of breath. Negative for chest tightness and wheezing.   Cardiovascular: Negative for chest pain, palpitations and leg swelling.  Gastrointestinal: Negative for abdominal distention, abdominal pain, blood in stool, constipation, diarrhea, nausea and vomiting.  Genitourinary: Negative for difficulty urinating and hematuria.  Musculoskeletal: Negative for arthralgias, myalgias and neck pain.  Skin: Negative for rash.  Neurological: Negative for dizziness, seizures, syncope and headaches.  Hematological: Negative for adenopathy. Does not bruise/bleed easily.  Psychiatric/Behavioral: Negative for dysphoric mood. The patient is not nervous/anxious.  Objective:  BP 124/62   Pulse 68   Temp 97.6 F (36.4 C) (Temporal)   Ht 5' (1.524 m)   Wt 235 lb 8 oz (106.8 kg)   LMP 03/08/2013   SpO2 97% Comment: 2 L  BMI 45.99 kg/m   Wt Readings from Last 3 Encounters:  10/01/20 235 lb 8 oz (106.8 kg)  09/13/20 230 lb 6.4 oz (104.5 kg)  06/10/20 230 lb 9.6 oz (104.6 kg)      Physical Exam Vitals and nursing note reviewed.  Constitutional:      General: She is not in acute distress.    Appearance: Normal appearance. She is well-developed. She is obese. She is not ill-appearing.     Comments: Supplemental O2 via [place]  HENT:     Head: Normocephalic and atraumatic.     Right Ear: Hearing, tympanic membrane, ear canal and external ear normal.     Left Ear: Hearing, tympanic membrane, ear canal and external ear normal.  Eyes:     General: No scleral icterus.    Extraocular Movements: Extraocular movements intact.     Conjunctiva/sclera: Conjunctivae normal.     Pupils: Pupils are equal, round, and reactive to  light.  Neck:     Thyroid: No thyroid mass or thyromegaly.     Vascular: No carotid bruit.  Cardiovascular:     Rate and Rhythm: Normal rate and regular rhythm.     Pulses: Normal pulses.          Radial pulses are 2+ on the right side and 2+ on the left side.     Heart sounds: Normal heart sounds. No murmur heard.   Pulmonary:     Effort: Pulmonary effort is normal. No respiratory distress.     Breath sounds: Normal breath sounds. No wheezing, rhonchi or rales.  Abdominal:     General: Bowel sounds are normal. There is no distension.     Palpations: Abdomen is soft. There is no mass.     Tenderness: There is no abdominal tenderness. There is no guarding or rebound.     Hernia: No hernia is present.  Musculoskeletal:        General: Normal range of motion.     Cervical back: Normal range of motion and neck supple.     Right lower leg: No edema.     Left lower leg: No edema.  Lymphadenopathy:     Cervical: No cervical adenopathy.  Skin:    General: Skin is warm and dry.     Findings: No rash.  Neurological:     General: No focal deficit present.     Mental Status: She is alert and oriented to person, place, and time.     Comments:  CN grossly intact, station and gait intact Recall 3/3 Calculation 5/5 DLROW  Psychiatric:        Mood and Affect: Mood normal.        Behavior: Behavior normal.        Thought Content: Thought content normal.        Judgment: Judgment normal.       Results for orders placed or performed in visit on 09/24/20  Phosphorus  Result Value Ref Range   Phosphorus 3.7 2.3 - 4.6 mg/dL  Parathyroid hormone, intact (no Ca)  Result Value Ref Range   PTH 38 16 - 77 pg/mL  VITAMIN D 25 Hydroxy (Vit-D Deficiency, Fractures)  Result Value Ref Range   VITD 26.05 (L) 30.00 - 100.00  ng/mL  CBC with Differential/Platelet  Result Value Ref Range   WBC 9.9 4.0 - 10.5 K/uL   RBC 4.47 3.87 - 5.11 Mil/uL   Hemoglobin 13.3 12.0 - 15.0 g/dL   HCT 40.6 36.0 -  46.0 %   MCV 90.8 78.0 - 100.0 fl   MCHC 32.8 30.0 - 36.0 g/dL   RDW 14.9 11.5 - 15.5 %   Platelets 303.0 150.0 - 400.0 K/uL   Neutrophils Relative % 55.8 43.0 - 77.0 %   Lymphocytes Relative 30.8 12.0 - 46.0 %   Monocytes Relative 7.9 3.0 - 12.0 %   Eosinophils Relative 4.7 0.0 - 5.0 %   Basophils Relative 0.8 0.0 - 3.0 %   Neutro Abs 5.5 1.4 - 7.7 K/uL   Lymphs Abs 3.0 0.7 - 4.0 K/uL   Monocytes Absolute 0.8 0.1 - 1.0 K/uL   Eosinophils Absolute 0.5 0.0 - 0.7 K/uL   Basophils Absolute 0.1 0.0 - 0.1 K/uL  Hemoglobin A1c  Result Value Ref Range   Hgb A1c MFr Bld 5.8 4.6 - 6.5 %  Comprehensive metabolic panel  Result Value Ref Range   Sodium 140 135 - 145 mEq/L   Potassium 4.5 3.5 - 5.1 mEq/L   Chloride 102 96 - 112 mEq/L   CO2 32 19 - 32 mEq/L   Glucose, Bld 102 (H) 70 - 99 mg/dL   BUN 18 6 - 23 mg/dL   Creatinine, Ser 1.00 0.40 - 1.20 mg/dL   Total Bilirubin 0.3 0.2 - 1.2 mg/dL   Alkaline Phosphatase 113 39 - 117 U/L   AST 12 0 - 37 U/L   ALT 11 0 - 35 U/L   Total Protein 6.8 6.0 - 8.3 g/dL   Albumin 3.6 3.5 - 5.2 g/dL   GFR 60.51 >60.00 mL/min   Calcium 9.4 8.4 - 10.5 mg/dL  Lipid panel  Result Value Ref Range   Cholesterol 161 0 - 200 mg/dL   Triglycerides 111.0 0.0 - 149.0 mg/dL   HDL 40.70 >39.00 mg/dL   VLDL 22.2 0.0 - 40.0 mg/dL   LDL Cholesterol 98 0 - 99 mg/dL   Total CHOL/HDL Ratio 4    NonHDL 120.10    Assessment & Plan:  This visit occurred during the SARS-CoV-2 public health emergency.  Safety protocols were in place, including screening questions prior to the visit, additional usage of staff PPE, and extensive cleaning of exam room while observing appropriate contact time as indicated for disinfecting solutions.   Problem List Items Addressed This Visit    HYPERTRIGLYCERIDEMIA    H/o this. Reviewed indication for statin (diabetic). Start atorvastatin 35m daily.  The ASCVD Risk score (Mikey BussingDC Jr., et al., 2013) failed to calculate for the following  reasons:   The patient has a prior MI or stroke diagnosis       Relevant Medications   atorvastatin (LIPITOR) 40 MG tablet   Leukocytosis    This has normalized today.       Mood disorder (HCC)    Stable period on abilify 538mand lexapro 206maily.       Cigarette smoker    Continue to encourage smoking cessation. Down to 2 cig/day. She has recently had lung imaging (01/2020).       HYPERTENSION, BENIGN ESSENTIAL    Chronic, stable on current regimen.       Relevant Medications   atorvastatin (LIPITOR) 40 MG tablet   Health maintenance examination    Preventative protocols reviewed and updated  unless pt declined. Discussed healthy diet and lifestyle.       Type 2 diabetes, controlled, with peripheral neuropathy (HCC)    Well controlled on low dose metformin.       Relevant Medications   atorvastatin (LIPITOR) 40 MG tablet   Chronic respiratory failure with hypoxia (HCC)    Continue supplemental O2 through [place]      Cor pulmonale (chronic) (HCC)   Relevant Medications   atorvastatin (LIPITOR) 40 MG tablet   COPD  GOLD II/III still smoking/ 02 dep    Advanced care planning/counseling discussion    Advanced directives received, scanned 08/2019. Children Renaee Munda, Camillia Herter are HCPOA. Asks about [facility] order - discussed - she will consider option.       Chronic kidney disease, stage 3a (HCC)   TIA (transient ischemic attack)    Continue aspirin, restart statin.       Relevant Medications   atorvastatin (LIPITOR) 40 MG tablet   Chronic combined systolic and diastolic CHF (congestive heart failure) ([place])    Sees cardiology.       Relevant Medications   atorvastatin (LIPITOR) 40 MG tablet   Basilar artery stenosis    Restart statin.       Relevant Medications   atorvastatin (LIPITOR) 40 MG tablet   Medicare annual wellness visit, initial - Primary    I have personally reviewed the Medicare Annual Wellness questionnaire and have noted 1. The patient's  medical and social history 2. Their use of alcohol, tobacco or illicit drugs 3. Their current medications and supplements 4. The patient's functional ability including ADL's, fall risks, home safety risks and hearing or visual impairment. Cognitive function has been assessed and addressed as indicated.  5. Diet and physical activity 6. Evidence for depression or mood disorders The patients weight, height, BMI have been recorded in the chart. I have made referrals, counseling and provided education to the patient based on review of the above and I have provided the pt with a written personalized care plan for preventive services. Provider list updated.. See scanned questionairre as needed for further documentation. Reviewed preventative protocols and updated unless pt declined.        Other Visit Diagnoses    Special screening for malignant neoplasms, colon       Relevant Orders   Fecal occult blood, imunochemical   Need for vaccination against Streptococcus pneumoniae       Relevant Orders   Pneumococcal conjugate vaccine 20-valent (Completed)       Meds ordered this encounter  Medications  . atorvastatin (LIPITOR) 40 MG tablet    Sig: Take 1 tablet (40 mg total) by mouth at bedtime.    Dispense:  90 tablet    Refill:  3  . Cholecalciferol (VITAMIN D3) 25 MCG (1000 UT) CAPS    Sig: Take 1 capsule (1,000 Units total) by mouth daily.    Dispense:  30 capsule   Orders Placed This Encounter  Procedures  . Fecal occult blood, imunochemical    Standing Status:   Future    Standing Expiration Date:   10/01/2021  . Pneumococcal conjugate vaccine 20-valent    Patient instructions: Pass by lab for stool kit.  IFOYDXA-12 today  Consider shingrix vaccine 2 shot series to help prevent shingles and its complications.  Bring [place] copy of advanced directive to update your chart.  Restart cholesterol medicine atorvastatin sent to pharmacy  Start vitamin D 1000 units daily over the  counter Good to see you  today Return as needed or in 6 months for follow up visit.  Continue current medicines  Follow up plan: Return in about 6 months (around 04/03/2021), or if symptoms worsen or fail to improve, for follow up visit.  Ria Bush, MD

## 2020-10-01 NOTE — Assessment & Plan Note (Signed)

## 2020-10-01 NOTE — Patient Instructions (Addendum)
Pass by lab for stool kit.  Prevnar-20 today  Consider shingrix vaccine 2 shot series to help prevent shingles and its complications.  Bring us copy of advanced directive to update your chart.  Restart cholesterol medicine atorvastatin sent to pharmacy  Start vitamin D 1000 units daily over the counter Good to see you today Return as needed or in 6 months for follow up visit.  Continue current medicines  Health Maintenance for Postmenopausal Women Menopause is a normal process in which your ability to get pregnant comes to an end. This process happens slowly over many months or years, usually between the ages of 48 and 55. Menopause is complete when you have missed your menstrual periods for 12 months. It is important to talk with your health care provider about some of the most common conditions that affect women after menopause (postmenopausal women). These include heart disease, cancer, and bone loss (osteoporosis). Adopting a healthy lifestyle and getting preventive care can help to promote your health and wellness. The actions you take can also lower your chances of developing some of these common conditions. What should I know about menopause? During menopause, you may get a number of symptoms, such as:  Hot flashes. These can be moderate or severe.  Night sweats.  Decrease in sex drive.  Mood swings.  Headaches.  Tiredness.  Irritability.  Memory problems.  Insomnia. Choosing to treat or not to treat these symptoms is a decision that you make with your health care provider. Do I need hormone replacement therapy?  Hormone replacement therapy is effective in treating symptoms that are caused by menopause, such as hot flashes and night sweats.  Hormone replacement carries certain risks, especially as you become older. If you are thinking about using estrogen or estrogen with progestin, discuss the benefits and risks with your health care provider. What is my risk for heart  disease and stroke? The risk of heart disease, heart attack, and stroke increases as you age. One of the causes may be a change in the body's hormones during menopause. This can affect how your body uses dietary fats, triglycerides, and cholesterol. Heart attack and stroke are medical emergencies. There are many things that you can do to help prevent heart disease and stroke. Watch your blood pressure  High blood pressure causes heart disease and increases the risk of stroke. This is more likely to develop in people who have high blood pressure readings, are of African descent, or are overweight.  Have your blood pressure checked: ? Every 3-5 years if you are 18-39 years of age. ? Every year if you are 40 years old or older. Eat a healthy diet  Eat a diet that includes plenty of vegetables, fruits, low-fat dairy products, and lean protein.  Do not eat a lot of foods that are high in solid fats, added sugars, or sodium.   Get regular exercise Get regular exercise. This is one of the most important things you can do for your health. Most adults should:  Try to exercise for at least 150 minutes each week. The exercise should increase your heart rate and make you sweat (moderate-intensity exercise).  Try to do strengthening exercises at least twice each week. Do these in addition to the moderate-intensity exercise.  Spend less time sitting. Even light physical activity can be beneficial. Other tips  Work with your health care provider to achieve or maintain a healthy weight.  Do not use any products that contain nicotine or tobacco, such as   cigarettes, e-cigarettes, and chewing tobacco. If you need help quitting, ask your health care provider.  Know your numbers. Ask your health care provider to check your cholesterol and your blood sugar (glucose). Continue to have your blood tested as directed by your health care provider. Do I need screening for cancer? Depending on your health history  and family history, you may need to have cancer screening at different stages of your life. This may include screening for:  Breast cancer.  Cervical cancer.  Lung cancer.  Colorectal cancer. What is my risk for osteoporosis? After menopause, you may be at increased risk for osteoporosis. Osteoporosis is a condition in which bone destruction happens more quickly than new bone creation. To help prevent osteoporosis or the bone fractures that can happen because of osteoporosis, you may take the following actions:  If you are 87-12 years old, get at least 1,000 mg of calcium and at least 600 mg of vitamin D per day.  If you are older than age 27 but younger than age 20, get at least 1,200 mg of calcium and at least 600 mg of vitamin D per day.  If you are older than age 63, get at least 1,200 mg of calcium and at least 800 mg of vitamin D per day. Smoking and drinking excessive alcohol increase the risk of osteoporosis. Eat foods that are rich in calcium and vitamin D, and do weight-bearing exercises several times each week as directed by your health care provider. How does menopause affect my mental health? Depression may occur at any age, but it is more common as you become older. Common symptoms of depression include:  Low or sad mood.  Changes in sleep patterns.  Changes in appetite or eating patterns.  Feeling an overall lack of motivation or enjoyment of activities that you previously enjoyed.  Frequent crying spells. Talk with your health care provider if you think that you are experiencing depression. General instructions See your health care provider for regular wellness exams and vaccines. This may include:  Scheduling regular health, dental, and eye exams.  Getting and maintaining your vaccines. These include: ? Influenza vaccine. Get this vaccine each year before the flu season begins. ? Pneumonia vaccine. ? Shingles vaccine. ? Tetanus, diphtheria, and pertussis  (Tdap) booster vaccine. Your health care provider may also recommend other immunizations. Tell your health care provider if you have ever been abused or do not feel safe at home. Summary  Menopause is a normal process in which your ability to get pregnant comes to an end.  This condition causes hot flashes, night sweats, decreased interest in sex, mood swings, headaches, or lack of sleep.  Treatment for this condition may include hormone replacement therapy.  Take actions to keep yourself healthy, including exercising regularly, eating a healthy diet, watching your weight, and checking your blood pressure and blood sugar levels.  Get screened for cancer and depression. Make sure that you are up to date with all your vaccines. This information is not intended to replace advice given to you by your health care provider. Make sure you discuss any questions you have with your health care provider. Document Revised: 05/11/2018 Document Reviewed: 05/11/2018 Elsevier Patient Education  2021 [employer].

## 2020-10-03 DIAGNOSIS — Z7189 Other specified counseling: Secondary | ICD-10-CM | POA: Insufficient documentation

## 2020-10-03 NOTE — Assessment & Plan Note (Signed)
Continue aspirin, restart statin.  

## 2020-10-03 NOTE — Assessment & Plan Note (Deleted)
Advanced directive - HCPOA isdaughter Shanda Bumps).Has advanced directive at home. Will bring me copy. Asks about [facility] order - discussed - she will consider option.

## 2020-10-03 NOTE — Assessment & Plan Note (Addendum)
H/o this. Reviewed indication for statin (diabetic). Start atorvastatin 40mg  daily.  The ASCVD Risk score DC Jr., et al., 2013) failed to calculate for the following reasons:   The patient has a prior MI or stroke diagnosis

## 2020-10-03 NOTE — Assessment & Plan Note (Addendum)
Advanced directives received, scanned 08/2019. Children Cay Schillings, Domingo Dimes are HCPOA. Asks about [facility] order - discussed - she will consider option.

## 2020-10-03 NOTE — Assessment & Plan Note (Signed)
Restart statin  

## 2020-10-03 NOTE — Assessment & Plan Note (Signed)
Stable period on abilify 5mg  and lexapro 20mg  daily.

## 2020-10-03 NOTE — Assessment & Plan Note (Signed)
Continue supplemental O2 through [place]

## 2020-10-03 NOTE — Assessment & Plan Note (Signed)
Well controlled on low dose metformin. 

## 2020-10-03 NOTE — Assessment & Plan Note (Signed)
Preventative protocols reviewed and updated unless pt declined. Discussed healthy diet and lifestyle.  

## 2020-10-03 NOTE — Assessment & Plan Note (Addendum)
Continue to encourage smoking cessation. Down to 2 cig/day. She has recently had lung imaging (01/2020).

## 2020-10-03 NOTE — Assessment & Plan Note (Signed)
This has normalized today.

## 2020-10-03 NOTE — Assessment & Plan Note (Signed)
Chronic, stable on current regimen.  

## 2020-10-03 NOTE — Assessment & Plan Note (Signed)
Sees cardiology 

## 2020-10-05 ENCOUNTER — Ambulatory Visit
Admission: RE | Admit: 2020-10-05 | Discharge: 2020-10-05 | Disposition: A | Discharge status: Home / Self Care | Payer: Medicare Other | Source: Ambulatory Visit | Attending: Family Medicine | Admitting: Family Medicine

## 2020-10-05 DIAGNOSIS — Z1231 Encounter for screening mammogram for malignant neoplasm of breast: Secondary | ICD-10-CM

## 2020-10-07 DIAGNOSIS — G4733 Obstructive sleep apnea (adult) (pediatric): Secondary | ICD-10-CM | POA: Diagnosis not present

## 2020-10-08 ENCOUNTER — Ambulatory Visit (INDEPENDENT_AMBULATORY_CARE_PROVIDER_SITE_OTHER): Payer: Medicare Other | Admitting: Adult Health

## 2020-10-08 ENCOUNTER — Encounter: Payer: Self-pay | Admitting: Adult Health

## 2020-10-08 VITALS — BP 112/72 | HR 84 | Ht 60.0 in | Wt 235.0 lb

## 2020-10-08 DIAGNOSIS — G459 Transient cerebral ischemic attack, unspecified: Secondary | ICD-10-CM

## 2020-10-08 NOTE — Progress Notes (Signed)
[facility] [address] [place]. [place] [id number] [phone]       OFFICE FOLLOW-UP NOTE  [name] Date of Birth:  12/12/1960 Medical Record Number:  [id number]    Chief Complaint  Patient presents with  . Follow-up    RM 14 alone Pt is well and stable, nothing new      HPI:   Today, 10/08/2020, Brenda [name] returns for stroke follow up.  She has been doing well since prior visit without new or reoccurring stroke/TIA symptoms.  She has remained on aspirin and atorvastatin without associated side effects.  Blood pressure today 112/72 -routinely monitors at home and typically 110s/70s.  Glucose levels have been stable.  Loop recorder has not shown atrial fibrillation thus far.  She remains on 2L O2via [place] at all times for COPD.  Reports nightly use of CPAP for apnea management.  Routinely follows with PCP, IR and cardiology.  No concerns at this time    History provided for reference purposes only  Initial visit 04/29/2020 Dr. Pearlean BrownieSethi:  Brenda [name] is a 60 year old Caucasian lady seen today for initial office follow-up visit following consultation for TIA in September 2021.  History is obtained from the patient, review of electronic medical records and I personally reviewed available pertinent imaging films in PACS.  Brenda [name] is a 60 year old Caucasian lady with past medical history of diabetes, hyperlipidemia, hypertension, COPD on home oxygen, obstructive sleep apnea, obesity who [facility]07/[facility] with sudden onset of left hemiparesis associated with nausea, vomiting, slurred speech and vertically stacked double vision.  CTA was unremarkable with CT angiogram of the head and neck suggested basilar artery stenosis.  Dr. Corliss Skainseveshwar did diagnostic cerebral catheter angiogram on 02/12/2020 which showed high-grade 90-95% distal basilar artery stenosis and plan was to do elective stenting in a week.  She was placed on aspirin and Brilinta.  2D  echo showed ejection fraction of 40 to 45% which was actually improved from previous echo from 01/31/2020 where her EF was 30 to 35%.  LDL cholesterol 52 mg percent.  Hemoglobin A1c was 6.2.  Urine drug screen was positive for cannabis.  Patient had repeat angiogram on 02/19/2020 a week later with intent for angioplasty but the stenosis was much improved and nonocclusive and hence no further revascularization was deemed necessary.  Patient etiology of stroke was felt to be cryptogenic embolism and hence patient underwent subsequently loop recorder placement on 04/02/2020 and so for paroxysmal A. fib has not yet been found.  The patient is currently on aspirin and Brilinta tolerating it well but does have minor bruising.  She has had no recurrent diplopia dysarthria or focal extremity weakness TIA or other stroke symptoms.  Blood pressures well controlled at home the today it is borderline in office at 144/71.  Fasting sugars have all ranged in the 110-120 range last hemoglobin A1c was 6.2.  The patient has gained 10 pounds recently due to stress plans to go on a diet and exercise and lose it off soon.  She remains on home oxygen for her COPD.  She has no new complaints.     ROS:   14 system review of systems is positive for those listed in HPI and all other systems negative. PMH:  Past Medical History:  Diagnosis Date  . Acute pancreatitis   . Asthma    main dyspnea thought due to deconditioning (10/2013)  . Bipolar I disorder, most recent episode (or current) manic,  unspecified    pt denies this  . Bronchitis, chronic obstructive (HCC) 2008 & 2009   FeV1 64% TLC 105% DLCO 55% 2008  -  FeV1 81% FeF 25-75 43% 2009  . COPD (chronic obstructive pulmonary disease) (HCC)    emphysema, not an issue per pulm (10/2013)  . History of pyelonephritis 05/2012  . HTN (hypertension)   . Hx of migraines   . Knee pain, right   . Leukocytosis, unspecified   . Lumbar disc disease with radiculopathy    multilevel  spondylitic changes, scoliosis, and anterolisthesis L4 not thought to currently be surg candidate (Dr. Phoebe Perch, [facility]) (Dr. Ollen Bowl, [facility])  . Nicotine addiction   . Nonspecific abnormal electrocardiogram (ECG) (EKG)   . Obesity   . Pure hyperglyceridemia   . RSV (respiratory syncytial virus pneumonia) 01/2020   hospitalization  . Suicide attempt (HCC) 06/2001  . Swelling of limb   . Urge and stress incontinence 07/2012   ([facility])    Social History:  Social History   Socioeconomic History  . Marital status: Widowed    Spouse name: Not on file  . Number of children: 3  . Years of education: Not on file  . Highest education level: Not on file  Occupational History  . Occupation: Administrator, arts: [employer]     Employer: [employer]   Tobacco Use  . Smoking status: Current Some Day Smoker    Years: 41.00    Types: Cigarettes  . Smokeless tobacco: Never Used  . Tobacco comment: 3 cigs per day 05/29/19 lmr  Vaping Use  . Vaping Use: Never used  Substance and Sexual Activity  . Alcohol use: Yes    Alcohol/week: 0.0 standard drinks    Comment: occasional  . Drug use: No  . Sexual activity: Not on file  Other Topics Concern  . Not on file  Social History Narrative   Lives with husband and son and daughter in [place] and grandchild   Occupation: Media planner at [employer]   Activity: limited by dyspnea   Diet: good water, fruits/vegetables daily   Social Determinants of Health   Financial Resource Strain: Not on file  Food Insecurity: Not on file  Transportation Needs: Not on file  Physical Activity: Not on file  Stress: Not on file  Social Connections: Not on file  Intimate Partner Violence: Not on file    Medications:   Current Outpatient Medications on File Prior to Visit  Medication Sig Dispense Refill  . albuterol (PROVENTIL) (2.5 MG/3ML) 0.083% nebulizer solution INHALE 1 VIAL VIA NEBULIZER EVERY 6 HOURS AS  NEEDED FOR WHEEZING/SHORTNESS OF BREATH 150 mL 1  . ARIPiprazole (ABILIFY) 5 MG tablet TAKE 1 TABLET BY MOUTH EVERY DAY 90 tablet 1  . aspirin EC 81 MG EC tablet Take 1 tablet (81 mg total) by mouth daily. Swallow whole. 30 tablet 0  . atorvastatin (LIPITOR) 40 MG tablet Take 1 tablet (40 mg total) by mouth at bedtime. 90 tablet 3  . benzonatate (TESSALON) 200 MG capsule Take 1 capsule (200 mg total) by mouth 3 (three) times daily as needed for cough. Swallow whole, to not bite pill 45 capsule 2  . BREZTRI AEROSPHERE 160-9-4.8 MCG/ACT AERO INHALE 2 PUFFS INTO LUNGS 2 TIMES DAILY 10.7 g 11  . cetirizine (ZYRTEC) 10 MG tablet Take 10 mg by mouth at bedtime.    . Cholecalciferol (VITAMIN D3) 25 MCG (1000 UT) CAPS Take 1 capsule (1,000 Units  total) by mouth daily. 30 capsule   . dextromethorphan-guaiFENesin (MUCINEX DM) 30-600 MG 12hr tablet Take 1 tablet by mouth 2 (two) times daily as needed for cough.    . empagliflozin (JARDIANCE) 10 MG TABS tablet Take 1 tablet (10 mg total) by mouth daily before breakfast. 90 tablet 3  . escitalopram (LEXAPRO) 20 MG tablet TAKE 1 AND 1/2 TABLETS BY MOUTH DAILY 135 tablet 0  . famotidine (PEPCID) 20 MG tablet TAKE 1 TABLET BY MOUTH EVERYDAY AT BEDTIME (Patient taking differently: Take 20 mg by mouth at bedtime.) 90 tablet 2  . fluticasone (FLONASE) 50 MCG/ACT nasal spray PLACE 1 SPRAY INTO BOTH NOSTRILS 2 (TWO) TIMES DAILY. 48 mL 3  . furosemide (LASIX) 20 MG tablet Take 1 tablet (20 mg total) by mouth daily. 90 tablet 1  . gabapentin (NEURONTIN) 300 MG capsule TAKE 2 TABLETS IN THE MORNING AND 3 TABLETS AT NIGHT 450 capsule 1  . LORazepam (ATIVAN) 0.5 MG tablet TAKE 1/2 TO 1 TABLET BY MOUTH TWICE A DAY AS NEEDED FOR ANXIETY 30 tablet 0  . metFORMIN (GLUCOPHAGE) 500 MG tablet TAKE 1 TABLET BY MOUTH EVERYDAY AT BEDTIME (Patient taking differently: Take 500 mg by mouth daily with breakfast.) 90 tablet 3  . metoprolol succinate (TOPROL-XL) 25 MG 24 hr tablet Take 1  tablet (25 mg total) by mouth daily. 90 tablet 1  . Multiple Vitamin (MULTIVITAMIN WITH MINERALS) TABS tablet Take 1 tablet by mouth daily.    Marland Kitchen nystatin (MYCOSTATIN) 100000 UNIT/ML suspension TAKE 1 TEASPOONFUL BY MOUTH 3 TIMES DAILY AS NEEDED FOR MOUTH SORES. (Patient taking differently: Take 5 mLs by mouth 3 (three) times daily as needed (mouth sores).) 150 mL 1  . PROAIR RESPICLICK 108 (90 Base) MCG/ACT AEPB INHALE 2 PUFFS INTO THE LUNGS EVERY 6 HOURS AS NEEDED FOR DYSPNEA/WHEEZE 2 each 0  . promethazine-dextromethorphan (PROMETHAZINE-DM) 6.25-15 MG/5ML syrup Take 5 mLs by mouth 4 (four) times daily as needed for cough. 240 mL 0  . sacubitril-valsartan (ENTRESTO) 24-26 MG Take 1 tablet by mouth 2 (two) times daily. 180 tablet 1  . spironolactone (ALDACTONE) 25 MG tablet Take 0.5 tablets (12.5 mg total) by mouth daily. 45 tablet 3   No current facility-administered medications on file prior to visit.    Allergies:   Allergies  Allergen Reactions  . Metolazone Other (See Comments)    Metabolic mood swings    Physical Exam Today's Vitals   10/08/20 1527  BP: 112/72  Pulse: 84  Weight: 235 lb (106.6 kg)  Height: 5' (1.524 m)   Body mass index is 45.9 kg/m.   General: Obese middle-aged Caucasian lady.  2L O2 via [place], seated, in no evident distress Head: head normocephalic and atraumatic.  Neck: supple with no carotid or supraclavicular bruits Cardiovascular: regular rate and rhythm, no murmurs Musculoskeletal: no deformity Skin:  no rash/petichiae Vascular:  Normal pulses all extremities  Neurologic Exam Mental Status: Awake and fully alert.  Fluent speech and language.  Oriented to place and time. Recent and remote memory intact. Attention span, concentration and fund of knowledge appropriate. Mood and affect appropriate.  Cranial Nerves: Pupils equal, briskly reactive to light. Extraocular movements full without nystagmus. Visual fields full to confrontation. Hearing intact.  Facial sensation intact. Face, tongue, palate moves normally and symmetrically.  Motor: Normal bulk and tone. Normal strength in all tested extremity muscles. Sensory.: intact to touch ,pinprick .position and vibratory sensation.  Coordination: Rapid alternating movements normal in all extremities. Finger-to-nose and heel-to-shin  performed accurately bilaterally. Gait and Station: Arises from chair with slight  difficulty. Stance is normal. Gait is cautious but with normal stride length and balance without use of assistive device. Able to heel, toe and tandem walk without difficulty.  Reflexes: 1+ and symmetric. Toes downgoing.      ASSESSMENT: 60 year old Caucasian lady with posterior circulation TIA in September 2021 secondary to embolism to distal basilar artery of cryptogenic etiology.  Vascular risk factors of diabetes, hypertension, hyperlipidemia, obesity, obstructive sleep apnea and LV dysfunction.     PLAN: -Continue aspirin 81 mg daily and atorvastatin 40 mg daily for secondary stroke prevention -Loop recorder has not shown atrial fibrillation thus far -Discussed secondary stroke prevention measures and importance of close PCP follow-up for aggressive stroke risk factor management -Recent lab work: A1c 5.8, LDL 98 (reports recent lipid panel elevated as she ran out of her statin medication -she has since restarted) -Continue to follow with PCP, cardiology and IR as scheduled   Overall stable and recommend follow-up on an as-needed basis  CC:  [facility] provider: Dr. Cannon Kettle, Wynona Canes, MD    Ihor Austin, [facility]  [facility] [address] [place], [place] [address]  Phone [phone] Fax [phone] Note: This document was prepared with digital dictation and possible smart phrase technology. Any transcriptional errors that result from this process are unintentional.

## 2020-10-08 NOTE — Patient Instructions (Addendum)
Your Plan:  Continue current treatment plan without any changes  Continue to follow up with other specialities and PCP as scheduled    Follow up as needed       Thank you for coming to see [place] at [facility]. I hope we have been able to provide you high quality care today.  You may receive a patient satisfaction survey over the next few weeks. We would appreciate your feedback and comments so that we may continue to improve ourselves and the health of our patients.

## 2020-10-09 ENCOUNTER — Other Ambulatory Visit: Payer: Self-pay | Admitting: Internal Medicine

## 2020-10-09 ENCOUNTER — Other Ambulatory Visit: Payer: Self-pay | Admitting: Cardiovascular Disease

## 2020-10-09 ENCOUNTER — Other Ambulatory Visit: Payer: Self-pay

## 2020-10-09 ENCOUNTER — Other Ambulatory Visit: Payer: Self-pay | Admitting: Family Medicine

## 2020-10-09 NOTE — Telephone Encounter (Signed)
ERx 

## 2020-10-09 NOTE — Telephone Encounter (Signed)
Refill request Lorazepam Last office visit 10/01/20 Last refill 08/12/20 #30

## 2020-10-09 NOTE — Progress Notes (Signed)
Carelink Summary Report / Loop Recorder 

## 2020-10-10 NOTE — Progress Notes (Signed)
I agree with the above plan 

## 2020-10-20 ENCOUNTER — Other Ambulatory Visit: Payer: Self-pay

## 2020-10-20 ENCOUNTER — Emergency Department (HOSPITAL_COMMUNITY): Payer: Medicare Other

## 2020-10-20 ENCOUNTER — Emergency Department (HOSPITAL_COMMUNITY)
Admission: EM | Admit: 2020-10-20 | Discharge: 2020-10-20 | Disposition: A | Discharge status: Home / Self Care | Payer: Medicare Other | Attending: Emergency Medicine | Admitting: Emergency Medicine

## 2020-10-20 DIAGNOSIS — M25561 Pain in right knee: Secondary | ICD-10-CM | POA: Diagnosis not present

## 2020-10-20 DIAGNOSIS — M25572 Pain in left ankle and joints of left foot: Secondary | ICD-10-CM | POA: Diagnosis not present

## 2020-10-20 DIAGNOSIS — E1122 Type 2 diabetes mellitus with diabetic chronic kidney disease: Secondary | ICD-10-CM | POA: Diagnosis not present

## 2020-10-20 DIAGNOSIS — I5042 Chronic combined systolic (congestive) and diastolic (congestive) heart failure: Secondary | ICD-10-CM | POA: Diagnosis not present

## 2020-10-20 DIAGNOSIS — Z7984 Long term (current) use of oral hypoglycemic drugs: Secondary | ICD-10-CM | POA: Diagnosis not present

## 2020-10-20 DIAGNOSIS — S8264XA Nondisplaced fracture of lateral malleolus of right fibula, initial encounter for closed fracture: Secondary | ICD-10-CM | POA: Diagnosis not present

## 2020-10-20 DIAGNOSIS — Z7982 Long term (current) use of aspirin: Secondary | ICD-10-CM | POA: Diagnosis not present

## 2020-10-20 DIAGNOSIS — S8292XA Unspecified fracture of left lower leg, initial encounter for closed fracture: Secondary | ICD-10-CM | POA: Insufficient documentation

## 2020-10-20 DIAGNOSIS — S82892A Other fracture of left lower leg, initial encounter for closed fracture: Secondary | ICD-10-CM

## 2020-10-20 DIAGNOSIS — N183 Chronic kidney disease, stage 3 unspecified: Secondary | ICD-10-CM | POA: Diagnosis not present

## 2020-10-20 DIAGNOSIS — J45909 Unspecified asthma, uncomplicated: Secondary | ICD-10-CM | POA: Diagnosis not present

## 2020-10-20 DIAGNOSIS — S99912A Unspecified injury of left ankle, initial encounter: Secondary | ICD-10-CM | POA: Diagnosis present

## 2020-10-20 DIAGNOSIS — Z7951 Long term (current) use of inhaled steroids: Secondary | ICD-10-CM | POA: Insufficient documentation

## 2020-10-20 DIAGNOSIS — F17210 Nicotine dependence, cigarettes, uncomplicated: Secondary | ICD-10-CM | POA: Diagnosis not present

## 2020-10-20 DIAGNOSIS — I130 Hypertensive heart and chronic kidney disease with heart failure and stage 1 through stage 4 chronic kidney disease, or unspecified chronic kidney disease: Secondary | ICD-10-CM | POA: Diagnosis not present

## 2020-10-20 DIAGNOSIS — Z79899 Other long term (current) drug therapy: Secondary | ICD-10-CM | POA: Diagnosis not present

## 2020-10-20 DIAGNOSIS — W19XXXA Unspecified fall, initial encounter: Secondary | ICD-10-CM | POA: Insufficient documentation

## 2020-10-20 DIAGNOSIS — R609 Edema, unspecified: Secondary | ICD-10-CM | POA: Diagnosis not present

## 2020-10-20 DIAGNOSIS — J441 Chronic obstructive pulmonary disease with (acute) exacerbation: Secondary | ICD-10-CM | POA: Diagnosis not present

## 2020-10-20 DIAGNOSIS — M7989 Other specified soft tissue disorders: Secondary | ICD-10-CM | POA: Diagnosis not present

## 2020-10-20 DIAGNOSIS — S80919A Unspecified superficial injury of unspecified knee, initial encounter: Secondary | ICD-10-CM | POA: Diagnosis not present

## 2020-10-20 MED ORDER — OXYCODONE HCL 5 MG PO TABS: 5.0000 mg | ORAL_TABLET | ORAL | 0 refills | Status: DC | PRN

## 2020-10-20 NOTE — ED Triage Notes (Signed)
Arrives via EMS from home. C/C fall last night, was sitting on the edge of the bed and got dizzy upon standing, fell and twisted her L ankle, swelling and briusing noted, R knee contusion as well. Denies hitting head or LOC.

## 2020-10-20 NOTE — Discharge Instructions (Addendum)
Apply ice and elevate for 20 minutes at a time. Take Oxycodone for pain, you can also take Tylenol. Follow up with your orthopedist, call Monday to schedule an appointment. Use the walker, do not try and put weight on this foot.

## 2020-10-20 NOTE — ED Provider Notes (Signed)
[facility]-EMERGENCY DEPT Provider Note   CSN: [id number] Arrival date & time: 10/20/20  1521     History Chief Complaint  Patient presents with  . Ankle Pain  . Fall    Brenda [name] is a 60 y.o. female.  60 year old female presents with complaint of left ankle pain. Patient states she stood up from sitting last night and fell forward injuring her left ankle. No LOC, no other injuries, unable to bear weight on left ankle due to pain.         Past Medical History:  Diagnosis Date  . Acute pancreatitis   . Asthma    main dyspnea thought due to deconditioning (10/2013)  . Bipolar I disorder, most recent episode (or current) manic, unspecified    pt denies this  . Bronchitis, chronic obstructive (HCC) 2008 & 2009   FeV1 64% TLC 105% DLCO 55% 2008  -  FeV1 81% FeF 25-75 43% 2009  . COPD (chronic obstructive pulmonary disease) (HCC)    emphysema, not an issue per pulm (10/2013)  . History of pyelonephritis 05/2012  . HTN (hypertension)   . Hx of migraines   . Knee pain, right   . Leukocytosis, unspecified   . Lumbar disc disease with radiculopathy    multilevel spondylitic changes, scoliosis, and anterolisthesis L4 not thought to currently be surg candidate (Dr. Phoebe Perch, [facility]) (Dr. Ollen Bowl, [place])  . Nicotine addiction   . Nonspecific abnormal electrocardiogram (ECG) (EKG)   . Obesity   . Pure hyperglyceridemia   . RSV (respiratory syncytial virus pneumonia) 01/2020   hospitalization  . Suicide attempt (HCC) 06/2001  . Swelling of limb   . Urge and stress incontinence 07/2012   ([facility])    Patient Active Problem List   Diagnosis Date Noted  . Medicare annual wellness visit, initial 10/01/2020  . Cannabis dependence (HCC) 04/30/2020  . Basilar artery stenosis 02/18/2020  . TIA (transient ischemic attack) 02/08/2020  . Chronic combined systolic and diastolic CHF (congestive heart failure) (HCC) 02/08/2020  . Elevated troponin 02/04/2020  .  Chronic kidney disease, stage 3a (HCC) 08/07/2019  . Knee mass, right 11/28/2018  . Advanced care planning/counseling discussion 07/27/2018  . Unilateral primary osteoarthritis, left knee 10/28/2017  . COPD  GOLD II/III still smoking/ 02 dep  10/20/2017  . Cor pulmonale (chronic) (HCC) 09/16/2017  . Diastolic dysfunction 08/26/2017  . Grief 02/24/2017  . Chronic respiratory failure with hypoxia (HCC) 07/31/2015  . Type 2 diabetes, controlled, with peripheral neuropathy (HCC)   . COPD exacerbation (HCC) 09/14/2014  . OSA on CPAP 09/14/2014  . Health maintenance examination 09/14/2014  . Upper airway cough syndrome 09/22/2013  . Chronic pain syndrome 05/30/2013  . Migraine, unspecified, without mention of intractable migraine without mention of status migrainosus 05/30/2013  . Pyelonephritis 05/05/2012  . Asthmatic bronchitis 02/27/2008  . Leukocytosis 02/22/2008  . NONSPECIFIC ABNORMAL ELECTROCARDIOGRAM 02/22/2008  . HYPERTENSION, BENIGN ESSENTIAL 06/23/2007  . HYPERTRIGLYCERIDEMIA 06/01/2007  . Morbid obesity with BMI of 40.0-44.9, adult (HCC) 06/01/2007  . Mood disorder (HCC) 06/01/2007  . Cigarette smoker 06/01/2007  . Left leg swelling 05/04/2007    Past Surgical History:  Procedure Laterality Date  . BUBBLE STUDY  04/02/2020   Procedure: BUBBLE STUDY;  Surgeon: Vesta Mixer, MD;  Location: [facility] ENDOSCOPY;  Service: Cardiovascular;;  . CHOLECYSTECTOMY  07/1982  . CT MAXILLOFACIAL WO/W CM  06/14/06   Left max mucocele  . CT of chest  06/14/2006   Normal except  c/w active inflammation / infection.  Left renal atrophy  . ERCP / sphincterotomy - stenosis  01/15/06  . IR ANGIO INTRA EXTRACRAN SEL COM CAROTID INNOMINATE BILAT MOD SED  02/09/2020  . IR ANGIO VERTEBRAL SEL SUBCLAVIAN INNOMINATE UNI L MOD SED  02/09/2020  . IR ANGIO VERTEBRAL SEL VERTEBRAL UNI R MOD SED  02/09/2020  . IR ANGIO VERTEBRAL SEL VERTEBRAL UNI R MOD SED  02/13/2020  . IR US GUIDE VASC ACCESS RIGHT   02/09/2020  . IR US GUIDE VASC ACCESS RIGHT  02/13/2020  . LOOP RECORDER INSERTION N/A 04/02/2020   Procedure: LOOP RECORDER INSERTION;  Surgeon: Marinus Maw, MD;  Location: [facility] INVASIVE CV LAB;  Service: Cardiovascular;  Laterality: N/A;  . lumbar MRI  11/2010   multilevel spondylitic changes with upper lumbar scoliosis and anterolisthesis of L4 on 5 with some L foraminal narrowing esp at L/3 with concavity of scoliosis, some central stenosis and biforaminal narrowing at L4/5 with spondylolisthesis  . [place] - Pneumonia, acute asthma, left maxillary sinusitis  01/11 - 06/18/2006  . RADIOLOGY WITH ANESTHESIA N/A 02/13/2020   Procedure: Carotid Stent;  Surgeon: Julieanne Cotton, MD;  Location: [facility] OR;  Service: Radiology;  Laterality: N/A;  . Retained stone     ERCP secondary to retained stone  . Right knee surgery  1984,1989,1989,1991,1994   Reconstructions for ACL insuff  . TEE WITHOUT CARDIOVERSION N/A 04/02/2020   Procedure: TRANSESOPHAGEAL ECHOCARDIOGRAM (TEE);  Surgeon: Elease Hashimoto, Deloris Ping, MD;  Location: [facility] ENDOSCOPY;  Service: Cardiovascular;  Laterality: N/A;  . TUBAL LIGATION       OB History    Gravida  4   Para  4   Term  4   Preterm      AB      Living  4     SAB      IAB      Ectopic      Multiple      Live Births  4           Family History  Problem Relation Age of Onset  . Heart disease Brother        MI in early 66's  . Hypertension Mother   . Hyperlipidemia Mother   . Heart disease Mother   . Hypertension Father   . Asthma Father   . Breast cancer Paternal Grandmother   . Breast cancer Paternal Aunt     Social History   Tobacco Use  . Smoking status: Current Some Day Smoker    Years: 41.00    Types: Cigarettes  . Smokeless tobacco: Never Used  . Tobacco comment: 3 cigs per day 05/29/19 lmr  Vaping Use  . Vaping Use: Never used  Substance Use Topics  . Alcohol use: Yes    Alcohol/week: 0.0 standard drinks    Comment: occasional   . Drug use: No    Home Medications Prior to Admission medications   Medication Sig Start Date End Date Taking? Authorizing Provider  furosemide (LASIX) 20 MG tablet TAKE 1 TABLET BY MOUTH EVERY DAY 10/09/20   O'Neal, Ronnald Ramp, MD  oxyCODONE (ROXICODONE) 5 MG immediate release tablet Take 1 tablet (5 mg total) by mouth every 4 (four) hours as needed for severe pain. 10/20/20  Yes Army Melia A, PA-C  albuterol (PROVENTIL) (2.5 MG/3ML) 0.083% nebulizer solution INHALE 1 VIAL VIA NEBULIZER EVERY 6 HOURS AS NEEDED FOR WHEEZING/SHORTNESS OF BREATH 05/23/20   [name], MD  ARIPiprazole (ABILIFY) 5 MG  tablet TAKE 1 TABLET BY MOUTH EVERY DAY 08/12/20   [name], MD  aspirin EC 81 MG EC tablet Take 1 tablet (81 mg total) by mouth daily. Swallow whole. 02/05/20   Narda Bonds, MD  atorvastatin (LIPITOR) 40 MG tablet Take 1 tablet (40 mg total) by mouth at bedtime. 10/01/20   [name], MD  benzonatate (TESSALON) 200 MG capsule Take 1 capsule (200 mg total) by mouth 3 (three) times daily as needed for cough. Swallow whole, to not bite pill 02/23/20   [name], MD  BREZTRI AEROSPHERE 160-9-4.8 MCG/ACT AERO INHALE 2 PUFFS INTO LUNGS 2 TIMES DAILY 07/04/20   Nyoka Cowden, MD  cetirizine (ZYRTEC) 10 MG tablet Take 10 mg by mouth at bedtime.    [provider]  Cholecalciferol (VITAMIN D3) 25 MCG (1000 UT) CAPS Take 1 capsule (1,000 Units total) by mouth daily. 10/01/20   [name], MD  dextromethorphan-guaiFENesin [facility] DM) 30-600 MG 12hr tablet Take 1 tablet by mouth 2 (two) times daily as needed for cough.    [provider]  empagliflozin (JARDIANCE) 10 MG TABS tablet Take 1 tablet (10 mg total) by mouth daily before breakfast. 06/10/20   O'Neal, Ronnald Ramp, MD  escitalopram (LEXAPRO) 20 MG tablet TAKE 1 AND 1/2 TABLETS BY MOUTH DAILY 08/09/20   [name], MD  famotidine (PEPCID) 20 MG tablet TAKE 1 TABLET BY MOUTH EVERYDAY AT  BEDTIME Patient taking differently: Take 20 mg by mouth at bedtime. 02/27/20   Nyoka Cowden, MD  fluticasone (FLONASE) 50 MCG/ACT nasal spray PLACE 1 SPRAY INTO BOTH NOSTRILS 2 (TWO) TIMES DAILY. 08/09/20   Nyoka Cowden, MD  gabapentin (NEURONTIN) 300 MG capsule TAKE 2 TABLETS IN THE MORNING AND 3 TABLETS AT NIGHT 08/12/20   [name], MD  LORazepam (ATIVAN) 0.5 MG tablet TAKE 1/2 TO 1 TABLET BY MOUTH TWICE A DAY AS NEEDED FOR ANXIETY 10/09/20   [name], MD  metFORMIN (GLUCOPHAGE) 500 MG tablet TAKE 1 TABLET BY MOUTH EVERYDAY AT BEDTIME 10/09/20   [name], MD  metoprolol succinate (TOPROL-XL) 25 MG 24 hr tablet Take 1 tablet (25 mg total) by mouth daily. 06/10/20   O'NealRonnald Ramp, MD  Multiple Vitamin (MULTIVITAMIN WITH MINERALS) TABS tablet Take 1 tablet by mouth daily.    [provider]  nystatin (MYCOSTATIN) 100000 UNIT/ML suspension TAKE 1 TEASPOONFUL BY MOUTH 3 TIMES DAILY AS NEEDED FOR MOUTH SORES. Patient taking differently: Take 5 mLs by mouth 3 (three) times daily as needed (mouth sores). 04/22/18   Parrett, Virgel Bouquet, NP  PROAIR RESPICLICK 108 (90 Base) MCG/ACT AEPB INHALE 2 PUFFS INTO THE LUNGS EVERY 6 HOURS AS NEEDED FOR DYSPNEA/WHEEZE 10/09/20   [name], MD  promethazine-dextromethorphan (PROMETHAZINE-DM) 6.25-15 MG/5ML syrup Take 5 mLs by mouth 4 (four) times daily as needed for cough. 02/15/20   Nyoka Cowden, MD  sacubitril-valsartan (ENTRESTO) 24-26 MG Take 1 tablet by mouth 2 (two) times daily. 06/10/20   O'NealRonnald Ramp, MD  spironolactone (ALDACTONE) 25 MG tablet Take 0.5 tablets (12.5 mg total) by mouth daily. 06/10/20 09/08/20  O'Neal, Ronnald Ramp, MD    Allergies    Metolazone  Review of Systems   Review of Systems  Constitutional: Negative for fever.  Musculoskeletal: Positive for arthralgias and joint swelling. Negative for back pain, neck pain and neck stiffness.  Skin: Positive for color change. Negative for  rash and wound.  Allergic/Immunologic: Positive for immunocompromised state.  Neurological: Negative for  weakness and numbness.    Physical Exam Updated Vital Signs BP 134/80 (BP Location: Right Arm)   Pulse 80   Temp 99 F (37.2 C) (Oral)   Resp 20   Ht 5' (1.524 m)   Wt 102.1 kg   LMP 03/08/2013   SpO2 100%   BMI 43.94 kg/m   Physical Exam Vitals and nursing note reviewed.  Constitutional:      General: She is not in acute distress.    Appearance: She is well-developed. She is obese. She is not diaphoretic.  HENT:     Head: Normocephalic and atraumatic.  Cardiovascular:     Pulses: Normal pulses.  Pulmonary:     Effort: Pulmonary effort is normal.  Musculoskeletal:        General: Swelling, tenderness and signs of injury present. No deformity.     Left ankle: Swelling and ecchymosis present. Tenderness present over the lateral malleolus and medial malleolus. No base of 5th metatarsal or proximal fibula tenderness. Decreased range of motion.     Comments: Swelling and tenderness to the left lateral ankle, mild tenderness to medial ankle. No pain at 5th metatarsal or proximal fibula.   Skin:    General: Skin is warm and dry.     Findings: Bruising present. No erythema or rash.  Neurological:     Mental Status: She is alert and oriented to person, place, and time.     Sensory: No sensory deficit.     Motor: No weakness.  Psychiatric:        Behavior: Behavior normal.     ED Results / Procedures / Treatments   Labs (all labs ordered are listed, but only abnormal results are displayed) Labs Reviewed - No data to display  EKG None  Radiology DG Ankle Complete Left  Result Date: 10/20/2020 CLINICAL DATA:  Left ankle pain after fall EXAM: LEFT ANKLE COMPLETE - 3+ VIEW COMPARISON:  None. FINDINGS: Acute obliquely oriented fracture of the distal fibular metaphysis. No significant displacement or angulation. Alignment at the ankle mortise remains congruent. Small  osseous fragment inferior to the tip of the medial malleolus which could represent an acute avulsion injury or sequela of remote trauma. Mild-moderate tibiotalar osteoarthritis with marginal osteophyte formation. Os trigonum versus intra-articular loose body at the posterior joint line. Small plantar calcaneal spur. Diffuse soft tissue swelling about the ankle, most pronounced laterally. IMPRESSION: 1. Acute nondisplaced fracture of the distal fibular metaphysis. 2. Small osseous fragment inferior to the tip of the medial malleolus which could represent an acute avulsion injury or sequela of remote trauma. Correlate with point tenderness. Electronically Signed   By: Duanne GuessNicholas  Plundo D.O.   On: 10/20/2020 16:02    Procedures Procedures   Medications Ordered in ED Medications - No data to display SPLINT APPLICATION Date/Time: 4:42 PM Authorized by: Jeannie FendLaura A Daffney Greenly Consent: Verbal consent obtained. Risks and benefits: risks, benefits and alternatives were discussed Consent given by: patient Splint applied by: Army MeliaLaura Dilraj Killgore, PA-C  Location details: left lower leg Splint type: posterior short leg with stirrup Supplies used: webril, ace, fiberglass OCL, stockinet  Post-procedure: The splinted body part was neurovascularly unchanged following the procedure. Patient tolerance: Patient tolerated the procedure well with no immediate complications.    ED Course  I have reviewed the triage vital signs and the nursing notes.  Pertinent labs & imaging results that were available during my care of the patient were reviewed by me and considered in my medical decision making (see chart for  details).  Clinical Course as of 10/20/20 1642  Sun Oct 20, 2020  [id number] 60 year old female with left ankle injury after a mechanical fall yesterday. Found to have medial and lateral left ankle pain with swelling laterally. DP pulse present, sensation intact.  XR with non displaced fracture of the distal fibula, also  possibly medial malleolus as she is tender there as well.  Patient was splinted by myself, ortho tech unavailable.  Patient has a walker at home she will use. Given rx for oxycodone, plans to follow up with her ortho.  [LM]    Clinical Course User Index [LM] Alden Hipp   MDM Rules/Calculators/A&P                          Final Clinical Impression(s) / ED Diagnoses Final diagnoses:  Closed fracture of left ankle, initial encounter    Rx / DC Orders ED Discharge Orders         Ordered    oxyCODONE (ROXICODONE) 5 MG immediate release tablet  Every 4 hours PRN        10/20/20 1609           Jeannie Fend, PA-C 10/20/20 1643    Arby Barrette, MD 11/04/20 1039

## 2020-10-22 ENCOUNTER — Ambulatory Visit (INDEPENDENT_AMBULATORY_CARE_PROVIDER_SITE_OTHER): Payer: Medicare Other

## 2020-10-22 DIAGNOSIS — G459 Transient cerebral ischemic attack, unspecified: Secondary | ICD-10-CM | POA: Diagnosis not present

## 2020-10-23 LAB — CUP PACEART REMOTE DEVICE CHECK
Date Time Interrogation Session: 20220519170607
Implantable Pulse Generator Implant Date: 20211102

## 2020-10-25 ENCOUNTER — Telehealth: Payer: Self-pay

## 2020-10-25 NOTE — Telephone Encounter (Signed)
Patient called she is requesting a rx refill for oxycodone, patient stated she has been alternating between oxy and tylenol but the tylenol isn't helping call back:[phone]

## 2020-10-25 NOTE — Telephone Encounter (Signed)
Pt was evaluated in the ER 10/20/20 s/p fall and that she was give rx at that time advised per PA not able to refill needs office eval. Pt is scheduled for follow up 10/31/20. Voiced understanding and will call with any questions.

## 2020-10-31 ENCOUNTER — Ambulatory Visit (INDEPENDENT_AMBULATORY_CARE_PROVIDER_SITE_OTHER): Payer: Medicare Other | Admitting: Orthopedic Surgery

## 2020-10-31 ENCOUNTER — Ambulatory Visit: Payer: Self-pay

## 2020-10-31 DIAGNOSIS — S82892A Other fracture of left lower leg, initial encounter for closed fracture: Secondary | ICD-10-CM

## 2020-10-31 DIAGNOSIS — M25572 Pain in left ankle and joints of left foot: Secondary | ICD-10-CM

## 2020-10-31 MED ORDER — OXYCODONE-ACETAMINOPHEN 5-325 MG PO TABS: 1.0000 | ORAL_TABLET | ORAL | 0 refills | Status: DC | PRN

## 2020-11-01 ENCOUNTER — Other Ambulatory Visit: Payer: Self-pay | Admitting: Internal Medicine

## 2020-11-01 ENCOUNTER — Other Ambulatory Visit: Payer: Self-pay | Admitting: Family Medicine

## 2020-11-01 NOTE — Telephone Encounter (Signed)
Name of Medication: Lorazepam Name of Pharmacy: [facility] Last Fill or Written Date and Quantity: 10/09/20, #30 Last Office Visit and Type: 10/01/20, AWV Next Office Visit and Type: 04/04/21, 6 mo f/u Last Controlled Substance Agreement Date: none  Last UDS: none

## 2020-11-01 NOTE — Telephone Encounter (Signed)
ERx 

## 2020-11-02 ENCOUNTER — Encounter (HOSPITAL_COMMUNITY): Payer: Self-pay | Admitting: Internal Medicine

## 2020-11-02 ENCOUNTER — Inpatient Hospital Stay (HOSPITAL_COMMUNITY)
Admission: EM | Disposition: A | Discharge status: Home / Self Care | Payer: Self-pay | Source: Home / Self Care | Attending: Internal Medicine

## 2020-11-02 ENCOUNTER — Inpatient Hospital Stay (HOSPITAL_COMMUNITY)
Admission: EM | Admit: 2020-11-02 | Discharge: 2020-11-04 | DRG: 247 | Disposition: A | Discharge status: Home / Self Care | Payer: Medicare Other | Attending: Internal Medicine | Admitting: Internal Medicine

## 2020-11-02 DIAGNOSIS — Z8673 Personal history of transient ischemic attack (TIA), and cerebral infarction without residual deficits: Secondary | ICD-10-CM | POA: Diagnosis not present

## 2020-11-02 DIAGNOSIS — I499 Cardiac arrhythmia, unspecified: Secondary | ICD-10-CM | POA: Diagnosis not present

## 2020-11-02 DIAGNOSIS — I420 Dilated cardiomyopathy: Secondary | ICD-10-CM | POA: Diagnosis present

## 2020-11-02 DIAGNOSIS — E781 Pure hyperglyceridemia: Secondary | ICD-10-CM | POA: Diagnosis not present

## 2020-11-02 DIAGNOSIS — Z20822 Contact with and (suspected) exposure to covid-19: Secondary | ICD-10-CM | POA: Diagnosis present

## 2020-11-02 DIAGNOSIS — I959 Hypotension, unspecified: Secondary | ICD-10-CM | POA: Diagnosis not present

## 2020-11-02 DIAGNOSIS — Z888 Allergy status to other drugs, medicaments and biological substances status: Secondary | ICD-10-CM | POA: Diagnosis not present

## 2020-11-02 DIAGNOSIS — M419 Scoliosis, unspecified: Secondary | ICD-10-CM | POA: Diagnosis present

## 2020-11-02 DIAGNOSIS — Z87891 Personal history of nicotine dependence: Secondary | ICD-10-CM | POA: Diagnosis present

## 2020-11-02 DIAGNOSIS — I2111 ST elevation (STEMI) myocardial infarction involving right coronary artery: Secondary | ICD-10-CM | POA: Diagnosis not present

## 2020-11-02 DIAGNOSIS — Z8249 Family history of ischemic heart disease and other diseases of the circulatory system: Secondary | ICD-10-CM

## 2020-11-02 DIAGNOSIS — R079 Chest pain, unspecified: Secondary | ICD-10-CM | POA: Diagnosis not present

## 2020-11-02 DIAGNOSIS — E1122 Type 2 diabetes mellitus with diabetic chronic kidney disease: Secondary | ICD-10-CM | POA: Diagnosis present

## 2020-11-02 DIAGNOSIS — Z6841 Body Mass Index (BMI) 40.0 and over, adult: Secondary | ICD-10-CM | POA: Diagnosis not present

## 2020-11-02 DIAGNOSIS — I10 Essential (primary) hypertension: Secondary | ICD-10-CM | POA: Diagnosis not present

## 2020-11-02 DIAGNOSIS — I130 Hypertensive heart and chronic kidney disease with heart failure and stage 1 through stage 4 chronic kidney disease, or unspecified chronic kidney disease: Secondary | ICD-10-CM | POA: Diagnosis not present

## 2020-11-02 DIAGNOSIS — I5022 Chronic systolic (congestive) heart failure: Secondary | ICD-10-CM

## 2020-11-02 DIAGNOSIS — Z955 Presence of coronary angioplasty implant and graft: Secondary | ICD-10-CM

## 2020-11-02 DIAGNOSIS — J449 Chronic obstructive pulmonary disease, unspecified: Secondary | ICD-10-CM | POA: Diagnosis present

## 2020-11-02 DIAGNOSIS — E119 Type 2 diabetes mellitus without complications: Secondary | ICD-10-CM | POA: Diagnosis not present

## 2020-11-02 DIAGNOSIS — I213 ST elevation (STEMI) myocardial infarction of unspecified site: Secondary | ICD-10-CM | POA: Diagnosis not present

## 2020-11-02 DIAGNOSIS — R0902 Hypoxemia: Secondary | ICD-10-CM | POA: Diagnosis not present

## 2020-11-02 DIAGNOSIS — N1831 Chronic kidney disease, stage 3a: Secondary | ICD-10-CM | POA: Diagnosis present

## 2020-11-02 DIAGNOSIS — I2510 Atherosclerotic heart disease of native coronary artery without angina pectoris: Secondary | ICD-10-CM | POA: Diagnosis not present

## 2020-11-02 DIAGNOSIS — Z825 Family history of asthma and other chronic lower respiratory diseases: Secondary | ICD-10-CM | POA: Diagnosis not present

## 2020-11-02 DIAGNOSIS — E1159 Type 2 diabetes mellitus with other circulatory complications: Secondary | ICD-10-CM | POA: Diagnosis not present

## 2020-11-02 DIAGNOSIS — E785 Hyperlipidemia, unspecified: Secondary | ICD-10-CM | POA: Diagnosis present

## 2020-11-02 DIAGNOSIS — E1142 Type 2 diabetes mellitus with diabetic polyneuropathy: Secondary | ICD-10-CM | POA: Diagnosis present

## 2020-11-02 DIAGNOSIS — Z7984 Long term (current) use of oral hypoglycemic drugs: Secondary | ICD-10-CM

## 2020-11-02 DIAGNOSIS — Z79899 Other long term (current) drug therapy: Secondary | ICD-10-CM

## 2020-11-02 DIAGNOSIS — I2119 ST elevation (STEMI) myocardial infarction involving other coronary artery of inferior wall: Principal | ICD-10-CM | POA: Diagnosis present

## 2020-11-02 DIAGNOSIS — Z7982 Long term (current) use of aspirin: Secondary | ICD-10-CM

## 2020-11-02 DIAGNOSIS — E1169 Type 2 diabetes mellitus with other specified complication: Secondary | ICD-10-CM | POA: Diagnosis not present

## 2020-11-02 DIAGNOSIS — Z9981 Dependence on supplemental oxygen: Secondary | ICD-10-CM | POA: Diagnosis not present

## 2020-11-02 DIAGNOSIS — F17210 Nicotine dependence, cigarettes, uncomplicated: Secondary | ICD-10-CM | POA: Diagnosis not present

## 2020-11-02 DIAGNOSIS — I5032 Chronic diastolic (congestive) heart failure: Secondary | ICD-10-CM | POA: Diagnosis not present

## 2020-11-02 DIAGNOSIS — E669 Obesity, unspecified: Secondary | ICD-10-CM | POA: Diagnosis not present

## 2020-11-02 DIAGNOSIS — R0789 Other chest pain: Secondary | ICD-10-CM | POA: Diagnosis not present

## 2020-11-02 DIAGNOSIS — R001 Bradycardia, unspecified: Secondary | ICD-10-CM

## 2020-11-02 HISTORY — PX: CORONARY/GRAFT ACUTE MI REVASCULARIZATION: CATH118305

## 2020-11-02 HISTORY — PX: LEFT HEART CATH AND CORONARY ANGIOGRAPHY: CATH118249

## 2020-11-02 LAB — GLUCOSE, CAPILLARY
Glucose-Capillary: 98 mg/dL (ref 70–99)
Glucose-Capillary: 104 mg/dL — ABNORMAL HIGH (ref 70–99)

## 2020-11-02 LAB — CBC
HCT: 44.1 % (ref 36.0–46.0)
Hemoglobin: 13.9 g/dL (ref 12.0–15.0)
MCH: 29.2 pg (ref 26.0–34.0)
MCHC: 31.5 g/dL (ref 30.0–36.0)
MCV: 92.6 fL (ref 80.0–100.0)
Platelets: 369 10*3/uL (ref 150–400)
RBC: 4.76 MIL/uL (ref 3.87–5.11)
RDW: 14.2 % (ref 11.5–15.5)
WBC: 18.5 10*3/uL — ABNORMAL HIGH (ref 4.0–10.5)
nRBC: 0 % (ref 0.0–0.2)

## 2020-11-02 LAB — RAPID URINE DRUG SCREEN, HOSP PERFORMED
Amphetamines: NOT DETECTED
Barbiturates: NOT DETECTED
Benzodiazepines: NOT DETECTED
Cocaine: NOT DETECTED
Opiates: NOT DETECTED
Tetrahydrocannabinol: POSITIVE — AB

## 2020-11-02 LAB — POCT I-STAT, CHEM 8
BUN: 17 mg/dL (ref 8–23)
Calcium, Ion: 1.22 mmol/L (ref 1.15–1.40)
Chloride: 97 mmol/L — ABNORMAL LOW (ref 98–111)
Creatinine, Ser: 0.9 mg/dL (ref 0.44–1.00)
Glucose, Bld: 122 mg/dL — ABNORMAL HIGH (ref 70–99)
HCT: 39 % (ref 36.0–46.0)
Hemoglobin: 13.3 g/dL (ref 12.0–15.0)
Potassium: 3.2 mmol/L — ABNORMAL LOW (ref 3.5–5.1)
Sodium: 137 mmol/L (ref 135–145)
TCO2: 28 mmol/L (ref 22–32)

## 2020-11-02 LAB — PROTIME-INR
INR: 1 (ref 0.8–1.2)
Prothrombin Time: 13.5 seconds (ref 11.4–15.2)

## 2020-11-02 LAB — COMPREHENSIVE METABOLIC PANEL
ALT: 15 U/L (ref 0–44)
AST: 18 U/L (ref 15–41)
Albumin: 3.6 g/dL (ref 3.5–5.0)
Alkaline Phosphatase: 101 U/L (ref 38–126)
Anion gap: 10 (ref 5–15)
BUN: 14 mg/dL (ref 8–23)
CO2: 28 mmol/L (ref 22–32)
Calcium: 9.3 mg/dL (ref 8.9–10.3)
Chloride: 101 mmol/L (ref 98–111)
Creatinine, Ser: 1.01 mg/dL — ABNORMAL HIGH (ref 0.44–1.00)
GFR, Estimated: >60 mL/min (ref >60)
Glucose, Bld: 155 mg/dL — ABNORMAL HIGH (ref 70–99)
Potassium: 3.5 mmol/L (ref 3.5–5.1)
Sodium: 139 mmol/L (ref 135–145)
Total Bilirubin: 0.6 mg/dL (ref 0.3–1.2)
Total Protein: 7.8 g/dL (ref 6.5–8.1)

## 2020-11-02 LAB — RESP PANEL BY RT-PCR (FLU A&B, COVID) ARPGX2
Influenza A by PCR: NEGATIVE
Influenza B by PCR: NEGATIVE
SARS Coronavirus 2 by RT PCR: NEGATIVE

## 2020-11-02 LAB — LIPID PANEL
Cholesterol: 118 mg/dL (ref 0–200)
HDL: 42 mg/dL (ref >40)
LDL Cholesterol: 49 mg/dL (ref 0–99)
Total CHOL/HDL Ratio: 2.8 RATIO
Triglycerides: 135 mg/dL (ref <150)
VLDL: 27 mg/dL (ref 0–40)

## 2020-11-02 LAB — MRSA PCR SCREENING: MRSA by PCR: NEGATIVE

## 2020-11-02 LAB — TROPONIN I (HIGH SENSITIVITY)
Troponin I (High Sensitivity): 107 ng/L (ref <18)
Troponin I (High Sensitivity): 2530 ng/L (ref <18)

## 2020-11-02 LAB — POCT ACTIVATED CLOTTING TIME
Activated Clotting Time: 261 seconds
Activated Clotting Time: 279 seconds

## 2020-11-02 LAB — APTT: aPTT: 28 seconds (ref 24–36)

## 2020-11-02 SURGERY — CORONARY/GRAFT ACUTE MI REVASCULARIZATION
Anesthesia: LOCAL

## 2020-11-02 MED ORDER — ACETAMINOPHEN 325 MG PO TABS
650.0000 mg | ORAL_TABLET | ORAL | Status: DC | PRN
  Administered 2020-11-04 (×2): 650 mg via ORAL
  Filled 2020-11-02 (×2): qty 2

## 2020-11-02 MED ORDER — LABETALOL HCL 5 MG/ML IV SOLN: 10.0000 mg | INTRAVENOUS | Status: AC | PRN

## 2020-11-02 MED ORDER — EMPAGLIFLOZIN 10 MG PO TABS
10.0000 mg | ORAL_TABLET | Freq: Every day | ORAL | Status: DC
  Administered 2020-11-03 – 2020-11-04 (×2): 10 mg via ORAL
  Filled 2020-11-02 (×2): qty 1

## 2020-11-02 MED ORDER — TICAGRELOR 90 MG PO TABS
ORAL_TABLET | ORAL | Status: AC
  Filled 2020-11-02: qty 2

## 2020-11-02 MED ORDER — TICAGRELOR 90 MG PO TABS
ORAL_TABLET | ORAL | Status: DC | PRN
  Administered 2020-11-02: 180 mg via ORAL

## 2020-11-02 MED ORDER — LORAZEPAM 0.5 MG PO TABS
0.5000 mg | ORAL_TABLET | Freq: Two times a day (BID) | ORAL | Status: DC | PRN
  Administered 2020-11-02 – 2020-11-03 (×2): 0.5 mg via ORAL
  Filled 2020-11-02 (×2): qty 1

## 2020-11-02 MED ORDER — ASPIRIN EC 81 MG PO TBEC
81.0000 mg | DELAYED_RELEASE_TABLET | Freq: Every day | ORAL | Status: DC
  Administered 2020-11-03 – 2020-11-04 (×2): 81 mg via ORAL
  Filled 2020-11-02 (×2): qty 1

## 2020-11-02 MED ORDER — FAMOTIDINE 20 MG PO TABS
20.0000 mg | ORAL_TABLET | Freq: Every day | ORAL | Status: DC
  Administered 2020-11-02 – 2020-11-03 (×2): 20 mg via ORAL
  Filled 2020-11-02 (×2): qty 1

## 2020-11-02 MED ORDER — HYDRALAZINE HCL 20 MG/ML IJ SOLN: 10.0000 mg | INTRAMUSCULAR | Status: AC | PRN

## 2020-11-02 MED ORDER — LIDOCAINE HCL (PF) 1 % IJ SOLN
INTRAMUSCULAR | Status: DC | PRN
  Administered 2020-11-02: 2 mL

## 2020-11-02 MED ORDER — GABAPENTIN 300 MG PO CAPS
900.0000 mg | ORAL_CAPSULE | Freq: Every day | ORAL | Status: DC
  Administered 2020-11-02 – 2020-11-03 (×2): 900 mg via ORAL
  Filled 2020-11-02 (×2): qty 3

## 2020-11-02 MED ORDER — SODIUM CHLORIDE 0.9 % IV SOLN: INTRAVENOUS | Status: AC

## 2020-11-02 MED ORDER — BENZONATATE 100 MG PO CAPS
200.0000 mg | ORAL_CAPSULE | Freq: Three times a day (TID) | ORAL | Status: DC | PRN
  Administered 2020-11-02 – 2020-11-03 (×3): 200 mg via ORAL
  Filled 2020-11-02 (×3): qty 2

## 2020-11-02 MED ORDER — INSULIN ASPART 100 UNIT/ML IJ SOLN: 0.0000 [IU] | Freq: Every day | INTRAMUSCULAR | Status: DC

## 2020-11-02 MED ORDER — ATORVASTATIN CALCIUM 40 MG PO TABS
40.0000 mg | ORAL_TABLET | Freq: Every day | ORAL | Status: DC
  Administered 2020-11-02 – 2020-11-03 (×2): 40 mg via ORAL
  Filled 2020-11-02 (×2): qty 1

## 2020-11-02 MED ORDER — DM-GUAIFENESIN ER 30-600 MG PO TB12: 1.0000 | ORAL_TABLET | Freq: Two times a day (BID) | ORAL | Status: DC | PRN

## 2020-11-02 MED ORDER — VERAPAMIL HCL 2.5 MG/ML IV SOLN
INTRAVENOUS | Status: AC
  Filled 2020-11-02: qty 2

## 2020-11-02 MED ORDER — CHLORHEXIDINE GLUCONATE CLOTH 2 % EX PADS
6.0000 | MEDICATED_PAD | Freq: Every day | CUTANEOUS | Status: DC
  Administered 2020-11-03 – 2020-11-04 (×2): 6 via TOPICAL

## 2020-11-02 MED ORDER — NITROGLYCERIN 1 MG/10 ML FOR IR/CATH LAB
INTRA_ARTERIAL | Status: AC
  Filled 2020-11-02: qty 10

## 2020-11-02 MED ORDER — HEPARIN SODIUM (PORCINE) 1000 UNIT/ML IJ SOLN
INTRAMUSCULAR | Status: DC | PRN
  Administered 2020-11-02: 6000 [IU] via INTRAVENOUS
  Administered 2020-11-02: 3000 [IU] via INTRAVENOUS

## 2020-11-02 MED ORDER — ENOXAPARIN SODIUM 40 MG/0.4ML IJ SOSY
40.0000 mg | PREFILLED_SYRINGE | INTRAMUSCULAR | Status: DC
  Administered 2020-11-03 – 2020-11-04 (×2): 40 mg via SUBCUTANEOUS
  Filled 2020-11-02 (×2): qty 0.4

## 2020-11-02 MED ORDER — HEPARIN (PORCINE) IN NACL 1000-0.9 UT/500ML-% IV SOLN
INTRAVENOUS | Status: AC
  Filled 2020-11-02: qty 1000

## 2020-11-02 MED ORDER — SODIUM CHLORIDE 0.9 % IV SOLN: 250.0000 mL | INTRAVENOUS | Status: DC | PRN

## 2020-11-02 MED ORDER — ALBUTEROL SULFATE (2.5 MG/3ML) 0.083% IN NEBU
2.5000 mg | INHALATION_SOLUTION | Freq: Four times a day (QID) | RESPIRATORY_TRACT | Status: DC | PRN
  Administered 2020-11-02 – 2020-11-03 (×3): 2.5 mg via RESPIRATORY_TRACT
  Filled 2020-11-02 (×3): qty 3

## 2020-11-02 MED ORDER — FLUTICASONE FUROATE-VILANTEROL 200-25 MCG/INH IN AEPB
1.0000 | INHALATION_SPRAY | Freq: Every day | RESPIRATORY_TRACT | Status: DC
  Administered 2020-11-03 – 2020-11-04 (×2): 1 via RESPIRATORY_TRACT
  Filled 2020-11-02: qty 28

## 2020-11-02 MED ORDER — ARIPIPRAZOLE 5 MG PO TABS
5.0000 mg | ORAL_TABLET | Freq: Every day | ORAL | Status: DC
  Administered 2020-11-03 – 2020-11-04 (×2): 5 mg via ORAL
  Filled 2020-11-02 (×2): qty 1

## 2020-11-02 MED ORDER — INSULIN ASPART 100 UNIT/ML IJ SOLN
0.0000 [IU] | Freq: Three times a day (TID) | INTRAMUSCULAR | Status: DC
  Administered 2020-11-04: 2 [IU] via SUBCUTANEOUS

## 2020-11-02 MED ORDER — HEPARIN (PORCINE) IN NACL 1000-0.9 UT/500ML-% IV SOLN
INTRAVENOUS | Status: DC | PRN
  Administered 2020-11-02 (×2): 500 mL

## 2020-11-02 MED ORDER — BUDESON-GLYCOPYRROL-FORMOTEROL 160-9-4.8 MCG/ACT IN AERO: 2.0000 | INHALATION_SPRAY | Freq: Two times a day (BID) | RESPIRATORY_TRACT | Status: DC

## 2020-11-02 MED ORDER — ONDANSETRON HCL 4 MG/2ML IJ SOLN: 4.0000 mg | Freq: Four times a day (QID) | INTRAMUSCULAR | Status: DC | PRN

## 2020-11-02 MED ORDER — TICAGRELOR 90 MG PO TABS
90.0000 mg | ORAL_TABLET | Freq: Two times a day (BID) | ORAL | Status: DC
  Administered 2020-11-02 – 2020-11-04 (×4): 90 mg via ORAL
  Filled 2020-11-02 (×4): qty 1

## 2020-11-02 MED ORDER — VERAPAMIL HCL 2.5 MG/ML IV SOLN
INTRAVENOUS | Status: DC | PRN
  Administered 2020-11-02: 10 mL via INTRA_ARTERIAL

## 2020-11-02 MED ORDER — ESCITALOPRAM OXALATE 10 MG PO TABS
30.0000 mg | ORAL_TABLET | Freq: Every day | ORAL | Status: DC
  Administered 2020-11-03 – 2020-11-04 (×2): 30 mg via ORAL
  Filled 2020-11-02 (×2): qty 3

## 2020-11-02 MED ORDER — SODIUM CHLORIDE 0.9 % IV SOLN
INTRAVENOUS | Status: AC | PRN
  Administered 2020-11-02: 10 mL/h via INTRAVENOUS

## 2020-11-02 MED ORDER — IOHEXOL 350 MG/ML SOLN
INTRAVENOUS | Status: DC | PRN
  Administered 2020-11-02: 90 mL

## 2020-11-02 MED ORDER — OXYCODONE HCL 5 MG PO TABS
5.0000 mg | ORAL_TABLET | ORAL | Status: DC | PRN
  Administered 2020-11-04 (×2): 5 mg via ORAL
  Filled 2020-11-02 (×2): qty 1

## 2020-11-02 MED ORDER — SODIUM CHLORIDE 0.9% FLUSH: 3.0000 mL | INTRAVENOUS | Status: DC | PRN

## 2020-11-02 MED ORDER — GABAPENTIN 300 MG PO CAPS
600.0000 mg | ORAL_CAPSULE | Freq: Every morning | ORAL | Status: DC
  Administered 2020-11-03 – 2020-11-04 (×2): 600 mg via ORAL
  Filled 2020-11-02 (×2): qty 2

## 2020-11-02 MED ORDER — FLUTICASONE PROPIONATE 50 MCG/ACT NA SUSP
1.0000 | Freq: Two times a day (BID) | NASAL | Status: DC
  Administered 2020-11-02 – 2020-11-04 (×4): 1 via NASAL
  Filled 2020-11-02: qty 16

## 2020-11-02 MED ORDER — SODIUM CHLORIDE 0.9% FLUSH
3.0000 mL | Freq: Two times a day (BID) | INTRAVENOUS | Status: DC
  Administered 2020-11-02 – 2020-11-03 (×3): 3 mL via INTRAVENOUS

## 2020-11-02 MED ORDER — UMECLIDINIUM BROMIDE 62.5 MCG/INH IN AEPB
1.0000 | INHALATION_SPRAY | Freq: Every day | RESPIRATORY_TRACT | Status: DC
  Administered 2020-11-03 – 2020-11-04 (×2): 1 via RESPIRATORY_TRACT
  Filled 2020-11-02: qty 7

## 2020-11-02 MED ORDER — METOPROLOL SUCCINATE ER 25 MG PO TB24
25.0000 mg | ORAL_TABLET | Freq: Every day | ORAL | Status: DC
  Administered 2020-11-03 – 2020-11-04 (×2): 25 mg via ORAL
  Filled 2020-11-02 (×3): qty 1

## 2020-11-02 MED ORDER — ATROPINE SULFATE 1 MG/10ML IJ SOSY
PREFILLED_SYRINGE | INTRAMUSCULAR | Status: DC | PRN
  Administered 2020-11-02: 1 mg via INTRAVENOUS

## 2020-11-02 MED ORDER — HEPARIN (PORCINE) IN NACL 2000-0.9 UNIT/L-% IV SOLN
INTRAVENOUS | Status: AC
  Filled 2020-11-02: qty 1000

## 2020-11-02 MED ORDER — LORATADINE 10 MG PO TABS
10.0000 mg | ORAL_TABLET | Freq: Every day | ORAL | Status: DC
  Administered 2020-11-02 – 2020-11-03 (×2): 10 mg via ORAL
  Filled 2020-11-02 (×2): qty 1

## 2020-11-02 MED ORDER — LIDOCAINE HCL (PF) 1 % IJ SOLN
INTRAMUSCULAR | Status: AC
  Filled 2020-11-02: qty 30

## 2020-11-02 SURGICAL SUPPLY — 17 items
BALLN SAPPHIRE 2.5X12 (BALLOONS) ×2
BALLN SAPPHIRE ~~LOC~~ 4.0X12 (BALLOONS) ×2 IMPLANT
BALLOON SAPPHIRE 2.5X12 (BALLOONS) ×1 IMPLANT
CATH INFINITI 5 FR JL3.5 (CATHETERS) ×1 IMPLANT
CATH LAUNCHER 6FR JR4 (CATHETERS) ×2 IMPLANT
DEVICE RAD COMP TR BAND LRG (VASCULAR PRODUCTS) ×1 IMPLANT
GLIDESHEATH SLEND SS 6F .021 (SHEATH) ×2 IMPLANT
GUIDEWIRE INQWIRE 1.5J.035X260 (WIRE) IMPLANT
INQWIRE 1.5J .035X260CM (WIRE) ×2
KIT ENCORE 26 ADVANTAGE (KITS) ×2 IMPLANT
KIT HEART LEFT (KITS) ×2 IMPLANT
PACK CARDIAC CATHETERIZATION (CUSTOM PROCEDURE TRAY) ×2 IMPLANT
STENT SYNERGY XD 3.50X20 (Permanent Stent) ×1 IMPLANT
SYNERGY XD 3.50X20 (Permanent Stent) ×2 IMPLANT
TRANSDUCER W/STOPCOCK (MISCELLANEOUS) ×2 IMPLANT
TUBING CIL FLEX 10 FLL-RA (TUBING) ×2 IMPLANT
WIRE RUNTHROUGH .014X180CM (WIRE) ×1 IMPLANT

## 2020-11-02 NOTE — ED Provider Notes (Signed)
[name] Provider Note   CSN: [id number] Arrival date & time: 11/02/20  1341     History Chief Complaint  Patient presents with  . Code STEMI    Brenda [name] is a 60 y.o. female.  60 year old female with extensive past medical history below including COPD on home oxygen, bipolar disorder, TIA, CHF, dilated cardiomyopathy who presents with chest pain.  Yesterday evening, patient began having central chest pressure which feels like a fist pushing on her chest with tingling down her left arm. Pain has waxed and waned since then but has never gone away.  When pain is severe, she has had associated nausea and vomiting.  She denies breathing problems.  She has been stable on home oxygen.  She does have productive cough but states that it is stable compared to her usual COPD cough.  She called EMS and they noted STEMI, activated STEMI team PTA.  She received aspirin and nitroglycerin x2 in route.  Of note, patient broke her ankle 3 weeks ago and has been following with Dr. Lajoyce Cornersuda in clinic.  The history is provided by the patient.       Past Medical History:  Diagnosis Date  . Acute pancreatitis   . Asthma    main dyspnea thought due to deconditioning (10/2013)  . Bipolar I disorder, most recent episode (or current) manic, unspecified    pt denies this  . Bronchitis, chronic obstructive (HCC) 2008 & 2009   FeV1 64% TLC 105% DLCO 55% 2008  -  FeV1 81% FeF 25-75 43% 2009  . COPD (chronic obstructive pulmonary disease) (HCC)    emphysema, not an issue per pulm (10/2013)  . History of pyelonephritis 05/2012  . HTN (hypertension)   . Hx of migraines   . Knee pain, right   . Leukocytosis, unspecified   . Lumbar disc disease with radiculopathy    multilevel spondylitic changes, scoliosis, and anterolisthesis L4 not thought to currently be surg candidate (Dr. Phoebe PerchHirsch, [facility]) (Dr. O[place]en [facility]arkins, [facility])  . Nicotine addiction   . Nonspecific abnormal  electrocardiogram (ECG) (EKG)   . Obesity   . Pure hyperglyceridemia   . RSV (respiratory syncytial virus pneumonia) 01/2020   hospitalization  . Suicide attempt (HCC) 06/2001  . Swelling of limb   . Urge and stress incontinence 07/2012   ([facility])    Patient Active Problem List   Diagnosis Date Noted  . STEMI (ST elevation myocardial infarction) (HCC) 11/02/2020  . Medicare annual wellness visit, initial 10/01/2020  . Cannabis dependence (HCC) 04/30/2020  . Basilar artery stenosis 02/18/2020  . TIA (transient ischemic attack) 02/08/2020  . Chronic combined systolic and diastolic CHF (congestive heart failure) (HCC) 02/08/2020  . Elevated troponin 02/04/2020  . Chronic kidney disease, stage 3a (HCC) 08/07/2019  . Knee mass, right 11/28/2018  . Advanced care planning/counseling discussion 07/27/2018  . Unilateral primary osteoarthritis, left knee 10/28/2017  . COPD  GOLD II/III still smoking/ 02 dep  10/20/2017  . Cor pulmonale (chronic) (HCC) 09/16/2017  . Diastolic dysfunction 08/26/2017  . Grief 02/24/2017  . Chronic respiratory failure with hypoxia (HCC) 07/31/2015  . Type 2 diabetes, controlled, with peripheral neuropathy (HCC)   . COPD exacerbation (HCC) 09/14/2014  . OSA on CPAP 09/14/2014  . Health maintenance examination 09/14/2014  . Upper airway cough syndrome 09/22/2013  . Chronic pain syndrome 05/30/2013  . Migraine, unspecified, without mention of intractable migraine without mention of status migrainosus 05/30/2013  . Pyelonephritis 05/05/2012  .  Asthmatic bronchitis 02/27/2008  . Leukocytosis 02/22/2008  . NONSPECIFIC ABNORMAL ELECTROCARDIOGRAM 02/22/2008  . HYPERTENSION, BENIGN ESSENTIAL 06/23/2007  . HYPERTRIGLYCERIDEMIA 06/01/2007  . Morbid obesity with BMI of 40.0-44.9, adult (HCC) 06/01/2007  . Mood disorder (HCC) 06/01/2007  . Cigarette smoker 06/01/2007  . Left leg swelling 05/04/2007    Past Surgical History:  Procedure Laterality Date  .  BUBBLE STUDY  04/02/2020   Procedure: BUBBLE STUDY;  Surgeon: Vesta Mixer, MD;  Location: [facility] ENDOSCOPY;  Service: Cardiovascular;;  . CHOLECYSTECTOMY  07/1982  . CT MAXILLOFACIAL WO/W CM  06/14/06   Left max mucocele  . CT of chest  06/14/2006   Normal except c/w active inflammation / infection.  Left renal atrophy  . ERCP / sphincterotomy - stenosis  01/15/06  . IR ANGIO INTRA EXTRACRAN SEL COM CAROTID INNOMINATE BILAT MOD SED  02/09/2020  . IR ANGIO VERTEBRAL SEL SUBCLAVIAN INNOMINATE UNI L MOD SED  02/09/2020  . IR ANGIO VERTEBRAL SEL VERTEBRAL UNI R MOD SED  02/09/2020  . IR ANGIO VERTEBRAL SEL VERTEBRAL UNI R MOD SED  02/13/2020  . IR US GUIDE VASC ACCESS RIGHT  02/09/2020  . IR US GUIDE VASC ACCESS RIGHT  02/13/2020  . LOOP RECORDER INSERTION N/A 04/02/2020   Procedure: LOOP RECORDER INSERTION;  Surgeon: Marinus Maw, MD;  Location: [facility] INVASIVE CV LAB;  Service: Cardiovascular;  Laterality: N/A;  . lumbar MRI  11/2010   multilevel spondylitic changes with upper lumbar scoliosis and anterolisthesis of L4 on 5 with some L foraminal narrowing esp at L/3 with concavity of scoliosis, some central stenosis and biforaminal narrowing at L4/5 with spondylolisthesis  . [place] - Pneumonia, acute asthma, left maxillary sinusitis  01/11 - 06/18/2006  . RADIOLOGY WITH ANESTHESIA N/A 02/13/2020   Procedure: Carotid Stent;  Surgeon: Julieanne Cotton, MD;  Location: [facility] OR;  Service: Radiology;  Laterality: N/A;  . Retained stone     ERCP secondary to retained stone  . Right knee surgery  1984,1989,1989,1991,1994   Reconstructions for ACL insuff  . TEE WITHOUT CARDIOVERSION N/A 04/02/2020   Procedure: TRANSESOPHAGEAL ECHOCARDIOGRAM (TEE);  Surgeon: Elease Hashimoto, Deloris Ping, MD;  Location: [facility] ENDOSCOPY;  Service: Cardiovascular;  Laterality: N/A;  . TUBAL LIGATION       OB History    Gravida  4   Para  4   Term  4   Preterm      AB      Living  4     SAB      IAB      Ectopic       Multiple      Live Births  4           Family History  Problem Relation Age of Onset  . Heart disease Brother        MI in early 32's  . Hypertension Mother   . Hyperlipidemia Mother   . Heart disease Mother   . Hypertension Father   . Asthma Father   . Breast cancer Paternal Grandmother   . Breast cancer Paternal Aunt     Social History   Tobacco Use  . Smoking status: Current Some Day Smoker    Years: 41.00    Types: Cigarettes  . Smokeless tobacco: Never Used  . Tobacco comment: 3 cigs per day 05/29/19 lmr  Vaping Use  . Vaping Use: Never used  Substance Use Topics  . Alcohol use: Yes    Alcohol/week: 0.0 standard drinks  Comment: occasional  . Drug use: No    Home Medications Prior to Admission medications   Medication Sig Start Date End Date Taking? Authorizing Provider  famotidine (PEPCID) 20 MG tablet TAKE 1 TABLET BY MOUTH EVERYDAY AT BEDTIME 11/01/20   Nyoka Cowden, MD  furosemide (LASIX) 20 MG tablet TAKE 1 TABLET BY MOUTH EVERY DAY 10/09/20   O'Neal, Ronnald Ramp, MD  albuterol (PROVENTIL) (2.5 MG/3ML) 0.083% nebulizer solution INHALE 1 VIAL VIA NEBULIZER EVERY 6 HOURS AS NEEDED FOR WHEEZING/SHORTNESS OF BREATH Patient taking differently: Take 2.5 mg by nebulization every 6 (six) hours as needed for wheezing or shortness of breath. 05/23/20   [name], MD  ARIPiprazole (ABILIFY) 5 MG tablet TAKE 1 TABLET BY MOUTH EVERY DAY 08/12/20   [name], MD  aspirin EC 81 MG EC tablet Take 1 tablet (81 mg total) by mouth daily. Swallow whole. 02/05/20   Narda Bonds, MD  atorvastatin (LIPITOR) 40 MG tablet Take 1 tablet (40 mg total) by mouth at bedtime. 10/01/20   [name], MD  benzonatate (TESSALON) 200 MG capsule Take 1 capsule (200 mg total) by mouth 3 (three) times daily as needed for cough. Swallow whole, to not bite pill 02/23/20   [name], MD  BREZTRI AEROSPHERE 160-9-4.8 MCG/ACT AERO INHALE 2 PUFFS INTO LUNGS 2 TIMES  DAILY 07/04/20   Nyoka Cowden, MD  cetirizine (ZYRTEC) 10 MG tablet Take 10 mg by mouth at bedtime.    [provider]  Cholecalciferol (VITAMIN D3) 25 MCG (1000 UT) CAPS Take 1 capsule (1,000 Units total) by mouth daily. 10/01/20   [name], MD  dextromethorphan-guaiFENesin [facility] DM) 30-600 MG 12hr tablet Take 1 tablet by mouth 2 (two) times daily as needed for cough.    [provider]  empagliflozin (JARDIANCE) 10 MG TABS tablet Take 1 tablet (10 mg total) by mouth daily before breakfast. 06/10/20   O'Neal, Ronnald Ramp, MD  escitalopram (LEXAPRO) 20 MG tablet TAKE 1 AND 1/2 TABLETS BY MOUTH DAILY 08/09/20   [name], MD  fluticasone (FLONASE) 50 MCG/ACT nasal spray PLACE 1 SPRAY INTO BOTH NOSTRILS 2 (TWO) TIMES DAILY. 08/09/20   Nyoka Cowden, MD  gabapentin (NEURONTIN) 300 MG capsule TAKE 2 TABLETS IN THE MORNING AND 3 TABLETS AT NIGHT 08/12/20   [name], MD  LORazepam (ATIVAN) 0.5 MG tablet TAKE 1/2 TO 1 TABLET BY MOUTH TWICE A DAY AS NEEDED FOR ANXIETY 11/01/20   [name], MD  metFORMIN (GLUCOPHAGE) 500 MG tablet TAKE 1 TABLET BY MOUTH EVERYDAY AT BEDTIME 10/09/20   [name], MD  metoprolol succinate (TOPROL-XL) 25 MG 24 hr tablet Take 1 tablet (25 mg total) by mouth daily. 06/10/20   O'NealRonnald Ramp, MD  Multiple Vitamin (MULTIVITAMIN WITH MINERALS) TABS tablet Take 1 tablet by mouth daily.    [provider]  nystatin (MYCOSTATIN) 100000 UNIT/ML suspension TAKE 1 TEASPOONFUL BY MOUTH 3 TIMES DAILY AS NEEDED FOR MOUTH SORES. Patient taking differently: Take 5 mLs by mouth 3 (three) times daily as needed (mouth sores). 04/22/18   Parrett, Virgel Bouquet, NP  oxyCODONE (ROXICODONE) 5 MG immediate release tablet Take 1 tablet (5 mg total) by mouth every 4 (four) hours as needed for severe pain. 10/20/20   Jeannie Fend, PA-C  oxyCODONE-acetaminophen (PERCOCET/ROXICET) 5-325 MG tablet Take 1 tablet by mouth every 4 (four) hours  as needed for severe pain. 10/31/20   [name], MD  PROAIR RESPICLICK 108 [phone] Base) MCG/ACT  AEPB INHALE 2 PUFFS INTO THE LUNGS EVERY 6 HOURS AS NEEDED FOR DYSPNEA/WHEEZE 10/09/20   [name], MD  promethazine-dextromethorphan (PROMETHAZINE-DM) 6.25-15 MG/5ML syrup Take 5 mLs by mouth 4 (four) times daily as needed for cough. 02/15/20   Nyoka Cowden, MD  sacubitril-valsartan (ENTRESTO) 24-26 MG Take 1 tablet by mouth 2 (two) times daily. 06/10/20   O'NealRonnald Ramp, MD  spironolactone (ALDACTONE) 25 MG tablet Take 0.5 tablets (12.5 mg total) by mouth daily. 06/10/20 09/08/20  O'Neal, Ronnald Ramp, MD    Allergies    Metolazone  Review of Systems   Review of Systems All other systems reviewed and are negative except that which was mentioned in HPI  Physical Exam Updated Vital Signs BP (!) 110/57 (BP Location: Left Arm)   Pulse 70   Temp 98.3 F (36.8 C) (Oral)   Resp 17   LMP 03/08/2013   SpO2 100%   Physical Exam Vitals and nursing note reviewed.  Constitutional:      General: She is not in acute distress.    Appearance: Normal appearance.  HENT:     Head: Normocephalic and atraumatic.  Eyes:     Conjunctiva/sclera: Conjunctivae normal.  Cardiovascular:     Rate and Rhythm: Normal rate and regular rhythm.     Heart sounds: Normal heart sounds. No murmur heard.   Pulmonary:     Effort: Pulmonary effort is normal.     Comments: Diminished breath sounds bilaterally Abdominal:     General: Abdomen is flat. Bowel sounds are normal. There is no distension.     Palpations: Abdomen is soft.     Tenderness: There is no abdominal tenderness.  Musculoskeletal:     Right lower leg: No edema.     Comments: Left lower leg in CAM walker, toes warm and well perfused  Skin:    General: Skin is warm and dry.  Neurological:     Mental Status: She is alert and oriented to person, place, and time.     Comments: fluent  Psychiatric:        Mood and Affect: Mood normal.         Behavior: Behavior normal.     ED Results / Procedures / Treatments   Labs (all labs ordered are listed, but only abnormal results are displayed) Labs Reviewed  COMPREHENSIVE METABOLIC PANEL  LIPID PANEL  HEMOGLOBIN A1C  CBC  PROTIME-INR  APTT  TROPONIN I (HIGH SENSITIVITY)    EKG EKG Interpretation  Date/Time:  Saturday November 02 2020 13:50:16 EDT Ventricular Rate:  70 PR Interval:  152 QRS Duration: 83 QT Interval:  398 QTC Calculation: 430 R Axis:   70 Text Interpretation: Sinus rhythm Low voltage, precordial leads Borderline repolarization abnormality new ST elevation in inferior leads and T wave inversion w/ ST depression I andaVL c/w STEMI Confirmed by Frederick Peers [phone]) on 11/02/2020 2:08:21 PM   Radiology No results found.  Procedures .Critical Care Performed by: Laurence Spates, MD Authorized by: Laurence Spates, MD   Critical care provider statement:    Critical care time (minutes):  30   Critical care time was exclusive of:  Separately billable procedures and treating other patients   Critical care was necessary to treat or prevent imminent or life-threatening deterioration of the following conditions:  Cardiac failure   Critical care was time spent personally by me on the following activities:  Development of treatment plan with patient or surrogate, discussions with consultants, evaluation of patient's response  to treatment, examination of patient, obtaining history from patient or surrogate, ordering and performing treatments and interventions, ordering and review of laboratory studies, ordering and review of radiographic studies and review of old charts     Medications Ordered in ED Medications  Heparin (Porcine) in NaCl 1000-0.9 UT/500ML-% SOLN (500 mLs  Given 11/02/20 1417)  lidocaine (PF) (XYLOCAINE) 1 % injection (2 mLs  Given 11/02/20 1417)  Radial Cocktail/Verapamil only (10 mLs Intra-arterial Given 11/02/20 1418)  heparin sodium  (porcine) injection (3,000 Units Intravenous Given 11/02/20 1439)  ticagrelor (BRILINTA) tablet (180 mg Oral Given 11/02/20 1425)  atropine 1 MG/10ML injection (1 mg Intravenous Given 11/02/20 1428)  0.9 %  sodium chloride infusion (999 mL/hr Intravenous Rate/Dose Change 11/02/20 1430)    ED Course  I have reviewed the triage vital signs and the nursing notes.     MDM Rules/Calculators/A&P                          She was alert and nontoxic on exam with stable vital signs.  I reviewed EMS EKG which does show clear inferior STEMI.  EKG here shows changes that are similar in pattern, not quite as severe.  Dr. Okey Dupre, STEMI cardiologist, at bedside.  Patient given heparin bolus and taken emergently to Cath Lab for intervention. Final Clinical Impression(s) / ED Diagnoses Final diagnoses:  ST elevation myocardial infarction (STEMI) involving other coronary artery of inferior wall [facility])    Rx / DC Orders ED Discharge Orders    None       Benjie Ricketson, Ambrose Finland, MD 11/02/20 1443

## 2020-11-02 NOTE — Plan of Care (Signed)

## 2020-11-02 NOTE — ED Triage Notes (Signed)
Pt arrives from home after calling EMS for chest pain starting yesterday around lunch with N/V due to pain around dinner. EMS reports elevation in leads 2,3, and AVf. Pt alert and oriented, able to communicate needs. EMS gave 324mg  of aspirin, 2 nitro, and 4mg  of zofran. Pt reports intermittent pain, ranging from almost nothing to significant. Pt also reports tingling left arm and that it sometimes feels "almost dead".

## 2020-11-02 NOTE — ED Notes (Signed)
Heparin 4000 unit bolus administered per Dr Cristal [place] verbal order.

## 2020-11-02 NOTE — Progress Notes (Signed)
Situation: Initial visit for spiritual/emotional support for pt Brenda [name], referral due to code STEMI.  Background: Facts: Pt was unavailable for visit at this time. Family: No family present at this time. Feelings: Not assessed at this time. Faith: Not assessed at this time.  Actions & Assessments: Please page for follow-up spiritual/emotional support.  Recommendations: Chaplain remains available for follow-up spiritual/emotional support as needed.  Rev. Mayme Genta, MDiv     11/02/20 1400  Clinical Encounter Type  Visited With Patient not available  Visit Type Initial;Code  Referral From Nurse

## 2020-11-02 NOTE — H&P (Signed)
Cardiology Admission History and Physical:   Patient ID: Brenda [name] MRN: [id number]; DOB: [birth year]   Admission date: 11/02/2020  PCP:  [name], MD   [facility] Providers Cardiologist:  Reatha Harps, MD      Chief Complaint:  Chest pain  Patient Profile:   Brenda [name] is a 60 y.o. female with history of stress-induced cardiomyopathy (01/2020), stroke, COPD on chronic oxygen, hypertension, hyperlipidemia, and type 2 diabetes mellitus diabetes mellitus, who is being seen 11/02/2020 for the evaluation of chest pain.  History of Present Illness:   [name] reports sudden onset of substernal chest pain yesterday around noon that has waxed and waned in intensity (up to 10/10).  It has been associated with intermittent nausea/vomiting and shortness of breath.  Pain radiates to the left arm.  She did not seek medical attention until her daughters called 911 today due to continued symptoms.  She was found to have inferior ST segment elevation when EMS arrived.  She has received ASA, NTG, and ondansetron.  She was also given a heparin bolus in the ED.  Chest pain remained 3/10 with associated left arm discomfort in the ED.  Patient was therefore referred for emergent left heart catheterization and possible PCI.  Catheterization showed thrombotic 95% stenosis of the distal RCA, which was treated by primary PCI with a DES.  She had nonobstructive LAD and LCx disease with normal LVEDP.  Transient hypotension and bradycardia develops during the case, treated with IV atropine and normal saline bolus.   Past Medical History:  Diagnosis Date  . Acute pancreatitis   . Asthma    main dyspnea thought due to deconditioning (10/2013)  . Bipolar I disorder, most recent episode (or current) manic, unspecified    pt denies this  . Bronchitis, chronic obstructive (HCC) 2008 & 2009   FeV1 64% TLC 105% DLCO 55% 2008  -  FeV1 81% FeF 25-75 43% 2009  . COPD (chronic obstructive pulmonary  disease) (HCC)    emphysema, not an issue per pulm (10/2013)  . History of pyelonephritis 05/2012  . HTN (hypertension)   . Hx of migraines   . Knee pain, right   . Leukocytosis, unspecified   . Lumbar disc disease with radiculopathy    multilevel spondylitic changes, scoliosis, and anterolisthesis L4 not thought to currently be surg candidate (Dr. Phoebe Perch, [facility]) (Dr. Ollen Bowl, [place])  . Nicotine addiction   . Nonspecific abnormal electrocardiogram (ECG) (EKG)   . Obesity   . Pure hyperglyceridemia   . RSV (respiratory syncytial virus pneumonia) 01/2020   hospitalization  . Suicide attempt (HCC) 06/2001  . Swelling of limb   . Urge and stress incontinence 07/2012   ([facility])    Past Surgical History:  Procedure Laterality Date  . BUBBLE STUDY  04/02/2020   Procedure: BUBBLE STUDY;  Surgeon: Vesta Mixer, MD;  Location: [facility] ENDOSCOPY;  Service: Cardiovascular;;  . CHOLECYSTECTOMY  07/1982  . CT MAXILLOFACIAL WO/W CM  06/14/06   Left max mucocele  . CT of chest  06/14/2006   Normal except c/w active inflammation / infection.  Left renal atrophy  . ERCP / sphincterotomy - stenosis  01/15/06  . IR ANGIO INTRA EXTRACRAN SEL COM CAROTID INNOMINATE BILAT MOD SED  02/09/2020  . IR ANGIO VERTEBRAL SEL SUBCLAVIAN INNOMINATE UNI L MOD SED  02/09/2020  . IR ANGIO VERTEBRAL SEL VERTEBRAL UNI R MOD SED  02/09/2020  . IR ANGIO VERTEBRAL SEL VERTEBRAL UNI R MOD SED  02/13/2020  . IR US GUIDE VASC ACCESS RIGHT  02/09/2020  . IR US GUIDE VASC ACCESS RIGHT  02/13/2020  . LOOP RECORDER INSERTION N/A 04/02/2020   Procedure: LOOP RECORDER INSERTION;  Surgeon: Marinus Maw, MD;  Location: [facility] INVASIVE CV LAB;  Service: Cardiovascular;  Laterality: N/A;  . lumbar MRI  11/2010   multilevel spondylitic changes with upper lumbar scoliosis and anterolisthesis of L4 on 5 with some L foraminal narrowing esp at L/3 with concavity of scoliosis, some central stenosis and biforaminal narrowing at L4/5 with  spondylolisthesis  . [place] - Pneumonia, acute asthma, left maxillary sinusitis  01/11 - 06/18/2006  . RADIOLOGY WITH ANESTHESIA N/A 02/13/2020   Procedure: Carotid Stent;  Surgeon: Julieanne Cotton, MD;  Location: [facility] OR;  Service: Radiology;  Laterality: N/A;  . Retained stone     ERCP secondary to retained stone  . Right knee surgery  1984,1989,1989,1991,1994   Reconstructions for ACL insuff  . TEE WITHOUT CARDIOVERSION N/A 04/02/2020   Procedure: TRANSESOPHAGEAL ECHOCARDIOGRAM (TEE);  Surgeon: Elease Hashimoto Deloris Ping, MD;  Location: [facility] ENDOSCOPY;  Service: Cardiovascular;  Laterality: N/A;  . TUBAL LIGATION       Medications Prior to Admission: Prior to Admission medications   Medication Sig Start Date Ugonna Keirsey Date Taking? Authorizing Provider  famotidine (PEPCID) 20 MG tablet TAKE 1 TABLET BY MOUTH EVERYDAY AT BEDTIME 11/01/20   Nyoka Cowden, MD  furosemide (LASIX) 20 MG tablet TAKE 1 TABLET BY MOUTH EVERY DAY 10/09/20   O'Neal, Ronnald Ramp, MD  albuterol (PROVENTIL) (2.5 MG/3ML) 0.083% nebulizer solution INHALE 1 VIAL VIA NEBULIZER EVERY 6 HOURS AS NEEDED FOR WHEEZING/SHORTNESS OF BREATH Patient taking differently: Take 2.5 mg by nebulization every 6 (six) hours as needed for wheezing or shortness of breath. 05/23/20   [name], MD  ARIPiprazole (ABILIFY) 5 MG tablet TAKE 1 TABLET BY MOUTH EVERY DAY 08/12/20   [name], MD  aspirin EC 81 MG EC tablet Take 1 tablet (81 mg total) by mouth daily. Swallow whole. 02/05/20   Narda Bonds, MD  atorvastatin (LIPITOR) 40 MG tablet Take 1 tablet (40 mg total) by mouth at bedtime. 10/01/20   [name], MD  benzonatate (TESSALON) 200 MG capsule Take 1 capsule (200 mg total) by mouth 3 (three) times daily as needed for cough. Swallow whole, to not bite pill 02/23/20   [name], MD  BREZTRI AEROSPHERE 160-9-4.8 MCG/ACT AERO INHALE 2 PUFFS INTO LUNGS 2 TIMES DAILY 07/04/20   Nyoka Cowden, MD  cetirizine (ZYRTEC) 10 MG  tablet Take 10 mg by mouth at bedtime.    [provider]  Cholecalciferol (VITAMIN D3) 25 MCG (1000 UT) CAPS Take 1 capsule (1,000 Units total) by mouth daily. 10/01/20   [name], MD  dextromethorphan-guaiFENesin [facility] DM) 30-600 MG 12hr tablet Take 1 tablet by mouth 2 (two) times daily as needed for cough.    [provider]  empagliflozin (JARDIANCE) 10 MG TABS tablet Take 1 tablet (10 mg total) by mouth daily before breakfast. 06/10/20   O'Neal, Ronnald Ramp, MD  escitalopram (LEXAPRO) 20 MG tablet TAKE 1 AND 1/2 TABLETS BY MOUTH DAILY 08/09/20   [name], MD  fluticasone (FLONASE) 50 MCG/ACT nasal spray PLACE 1 SPRAY INTO BOTH NOSTRILS 2 (TWO) TIMES DAILY. 08/09/20   Nyoka Cowden, MD  gabapentin (NEURONTIN) 300 MG capsule TAKE 2 TABLETS IN THE MORNING AND 3 TABLETS AT NIGHT 08/12/20   [name], MD  LORazepam (ATIVAN) 0.5 MG  tablet TAKE 1/2 TO 1 TABLET BY MOUTH TWICE A DAY AS NEEDED FOR ANXIETY 11/01/20   [name], MD  metFORMIN (GLUCOPHAGE) 500 MG tablet TAKE 1 TABLET BY MOUTH EVERYDAY AT BEDTIME 10/09/20   [name], MD  metoprolol succinate (TOPROL-XL) 25 MG 24 hr tablet Take 1 tablet (25 mg total) by mouth daily. 06/10/20   O'NealRonnald Ramp, MD  Multiple Vitamin (MULTIVITAMIN WITH MINERALS) TABS tablet Take 1 tablet by mouth daily.    [provider]  nystatin (MYCOSTATIN) 100000 UNIT/ML suspension TAKE 1 TEASPOONFUL BY MOUTH 3 TIMES DAILY AS NEEDED FOR MOUTH SORES. Patient taking differently: Take 5 mLs by mouth 3 (three) times daily as needed (mouth sores). 04/22/18   Parrett, Virgel Bouquet, NP  oxyCODONE (ROXICODONE) 5 MG immediate release tablet Take 1 tablet (5 mg total) by mouth every 4 (four) hours as needed for severe pain. 10/20/20   Jeannie Fend, PA-C  oxyCODONE-acetaminophen (PERCOCET/ROXICET) 5-325 MG tablet Take 1 tablet by mouth every 4 (four) hours as needed for severe pain. 10/31/20   [name], MD  PROAIR  RESPICLICK [phone] Base) MCG/ACT AEPB INHALE 2 PUFFS INTO THE LUNGS EVERY 6 HOURS AS NEEDED FOR DYSPNEA/WHEEZE 10/09/20   [name], MD  promethazine-dextromethorphan (PROMETHAZINE-DM) 6.25-15 MG/5ML syrup Take 5 mLs by mouth 4 (four) times daily as needed for cough. 02/15/20   Nyoka Cowden, MD  sacubitril-valsartan (ENTRESTO) 24-26 MG Take 1 tablet by mouth 2 (two) times daily. 06/10/20   O'NealRonnald Ramp, MD  spironolactone (ALDACTONE) 25 MG tablet Take 0.5 tablets (12.5 mg total) by mouth daily. 06/10/20 09/08/20  O'NealRonnald Ramp, MD     Allergies:    Allergies  Allergen Reactions  . Metolazone Other (See Comments)    Metabolic mood swings    Social History:   Social History   Tobacco Use  . Smoking status: Current Every Day Smoker    Packs/day: 0.25    Years: 41.00    Pack years: 10.25    Types: Cigarettes  . Smokeless tobacco: Never Used  Vaping Use  . Vaping Use: Never used  Substance Use Topics  . Alcohol use: Yes    Alcohol/week: 0.0 standard drinks    Comment: occasional  . Drug use: Not Currently    Types: Marijuana     Family History:  The patient's family history includes Asthma in her father; Breast cancer in her paternal aunt and paternal grandmother; Heart disease in her brother and mother; Hyperlipidemia in her mother; Hypertension in her father and mother.    ROS:  Please see the history of present illness.  All other ROS reviewed and negative.     Physical Exam/Data:   Vitals:   11/02/20 1437 11/02/20 1442 11/02/20 1447 11/02/20 1452  BP: (!) 97/50 (!) 97/55 (!) 92/49   Pulse: 80 78 78 75  Resp: 10 11 18  (!) 6  Temp:      TempSrc:      SpO2: 98% 99% 98% (!) 0%   No intake or output data in the 24 hours ending 11/02/20 1532 Last 3 Weights 10/20/2020 10/08/2020 10/01/2020  Weight (lbs) 225 lb 235 lb 235 lb 8 oz  Weight (kg) 102.059 kg 106.595 kg 106.822 kg     There is no height or weight on file to calculate BMI.  General:  Well  nourished, well developed woman.  She appears uncomfortable on the gurney in the emergency department. HEENT: normal Lymph: no adenopathy Neck: no  JVD Endocrine:  No thryomegaly Vascular: No carotid bruits; 2+ radial pulses bilaterally. Cardiac:  normal S1, S2; RRR with 1/6 systolic murmur.  No rubs or gallops. Lungs:  clear to auscultation bilaterally, no wheezing, rhonchi or rales  Abd: soft, nontender, no hepatomegaly  Ext: no lower extremity edema Musculoskeletal:  No deformities, BUE and BLE strength normal and equal Skin: warm and dry  Neuro:  CNs 2-12 intact, no focal abnormalities noted Psych:  Normal affect    EKG:  The ECG that was done today at 1:50 PM was personally reviewed and demonstrates normal sinus rhythm with 1 mm of ST elevation isolated to lead III and ST depressions in I, aVL, and V2.  Inferior ST elevation is less pronounced compared with EMS tracing obtained earlier today.  Relevant CV Studies: LHC/PCI (11/02/2020): Conclusions: 1. Severe single-vessel coronary artery disease with 95% thrombotic stenosis of distal RCA and TIMI-2 flow into its distal branches. 2. Nonobstructive LAD/LCx disease.  The distal LAD tapers to a small vessel, terminating before the apex. 3. Normal left ventricular filling pressure (LVEDP 12 mmHg). 4. Successful PCI to distal RCA using Synergy 3.5 x 20 mm drug-eluting stent (postdilated to 4.1 mm) with 0% residual stenosis and TIMI-3 flow.  Laboratory Data:  High Sensitivity Troponin:  No results for input(s): TROPONINIHS in the last 720 hours.    Chemistry Recent Labs  Lab 11/02/20 1435  NA 137  K 3.2*  CL 97*  GLUCOSE 122*  BUN 17  CREATININE 0.90    No results for input(s): PROT, ALBUMIN, AST, ALT, ALKPHOS, BILITOT in the last 168 hours. Hematology Recent Labs  Lab 11/02/20 1423 11/02/20 1435  WBC 18.5*  --   RBC 4.76  --   HGB 13.9 13.3  HCT 44.1 39.0  MCV 92.6  --   MCH 29.2  --   MCHC 31.5  --   RDW 14.2  --    PLT 369  --    BNPNo results for input(s): BNP, PROBNP in the last 168 hours.  DDimer No results for input(s): DDIMER in the last 168 hours.   Radiology/Studies:  CARDIAC CATHETERIZATION  Result Date: 11/02/2020 Conclusions: 1. Severe single-vessel coronary artery disease with 95% thrombotic stenosis of distal RCA and TIMI-2 flow into its distal branches. 2. Nonobstructive LAD/LCx disease.  The distal LAD tapers to a small vessel, terminating before the apex. 3. Normal left ventricular filling pressure (LVEDP 12 mmHg). 4. Successful PCI to distal RCA using Synergy 3.5 x 20 mm drug-eluting stent (postdilated to 4.1 mm) with 0% residual stenosis and TIMI-3 flow. Recommendations: 1. Dual antiplatelet therapy with aspirin and ticagrelor for at least 12 months. 2. Aggressive secondary prevention, including high intensity statin therapy and tobacco cessation. 3. Obtain transthoracic echocardiogram. Yvonne Kendallhristopher Katera Rybka, MD [facility]     Assessment and Plan:   Inferior STEMI: Patient presents with 1 day history of waxing and waning chest pain accompanied with left arm discomfort, nausea, vomiting, and shortness of breath.  EKG by EMS consistent with inferior STEMI, though ST segment changes had improved upon arrival at the ED after the patient received aspirin and nitroglycerin.  Given continued chest pain, she was transported to the Cath Lab and found to have 95% distal RCA stenosis with associated thrombus consistent with acute plaque rupture (type I MI).  She is now status post primary PCI to the distal RCA with resolution of symptoms.  Admitted to heart ICU.  Obtain echocardiogram.  Dual antiplatelet therapy with aspirin and  ticagrelor for at least 12 months.  Continue atorvastatin 40 mg daily with lipid panel pending (goal LDL less than 70).  Check urine drug screen given history of tobacco and marijuana use.  Bradycardia and hypotension: Patient with soft blood pressure prior to  initiation of procedure with transient hypotension (blood pressure 50s over 30s) in the setting of angioplasty to distal RCA.  Blood pressure and heart rate have improved with normal saline bolus and 1 mg of IV atropine.  Hold Entresto.  Restart low-dose metoprolol tomorrow morning if blood pressure/heart rate tolerate.  Chronic HFrEF: Patient without symptoms of heart failure and normal LVEDP during catheterization.  Obtain echocardiogram.  Continue metoprolol succinate 25 mg daily as heart rate and blood pressure allow.  Hold Entresto for now in the setting of soft blood pressure.  Hopefully, this can be restarted prior to discharge.  Continue empagliflozin.  Type 2 diabetes mellitus:  Hold metformin.  Continue empagliflozin.  Sliding scale insulin.  Hemoglobin A1c pending.  Hyperlipidemia:  Continue atorvastatin 40 mg daily.  Check fasting lipid panel (goal LDL less than 70).   Risk Assessment/Risk Scores:    TIMI Risk Score for ST  Elevation MI:   The patient's TIMI risk score is 5, which indicates a 12.4% risk of all cause mortality at 30 days.  New York Heart Association (NYHA) Functional Class NYHA Class II  Severity of Illness: The appropriate patient status for this patient is INPATIENT. Inpatient status is judged to be reasonable and necessary in order to provide the required intensity of service to ensure the patient's safety. The patient's presenting symptoms, physical exam findings, and initial radiographic and laboratory data in the context of their chronic comorbidities is felt to place them at high risk for further clinical deterioration. Furthermore, it is not anticipated that the patient will be medically stable for discharge from the hospital within 2 midnights of admission. The following factors support the patient status of inpatient.   " The patient's presenting symptoms include waxing and waning chest pain, nausea/vomiting, and shortness of  breath. " The worrisome physical exam findings include borderline hypotension. " The initial radiographic and laboratory data are worrisome because of inferior ST elevation on EKG by EMS. " The chronic co-morbidities include chronic HFrEF (stress-induced cardiomyopathy), hypertension, hyperlipidemia, and type 2 diabetes mellitus.   * I certify that at the point of admission it is my clinical judgment that the patient will require inpatient hospital care spanning beyond 2 midnights from the point of admission due to high intensity of service, high risk for further deterioration and high frequency of surveillance required.*    For questions or updates, please contact [facility] Please consult [email or web address] for contact info under   Signed, Yvonne Kendall, MD  11/02/2020 3:32 PM

## 2020-11-03 ENCOUNTER — Inpatient Hospital Stay (HOSPITAL_COMMUNITY): Payer: Medicare Other

## 2020-11-03 DIAGNOSIS — E669 Obesity, unspecified: Secondary | ICD-10-CM

## 2020-11-03 DIAGNOSIS — I5032 Chronic diastolic (congestive) heart failure: Secondary | ICD-10-CM

## 2020-11-03 DIAGNOSIS — I2111 ST elevation (STEMI) myocardial infarction involving right coronary artery: Secondary | ICD-10-CM

## 2020-11-03 DIAGNOSIS — E1169 Type 2 diabetes mellitus with other specified complication: Secondary | ICD-10-CM

## 2020-11-03 LAB — GLUCOSE, CAPILLARY
Glucose-Capillary: 102 mg/dL — ABNORMAL HIGH (ref 70–99)
Glucose-Capillary: 110 mg/dL — ABNORMAL HIGH (ref 70–99)
Glucose-Capillary: 98 mg/dL (ref 70–99)
Glucose-Capillary: 107 mg/dL — ABNORMAL HIGH (ref 70–99)

## 2020-11-03 LAB — CBC
HCT: 41.6 % (ref 36.0–46.0)
Hemoglobin: 13.3 g/dL (ref 12.0–15.0)
MCH: 29.5 pg (ref 26.0–34.0)
MCHC: 32 g/dL (ref 30.0–36.0)
MCV: 92.2 fL (ref 80.0–100.0)
Platelets: 312 10*3/uL (ref 150–400)
RBC: 4.51 MIL/uL (ref 3.87–5.11)
RDW: 14.5 % (ref 11.5–15.5)
WBC: 14.1 10*3/uL — ABNORMAL HIGH (ref 4.0–10.5)
nRBC: 0 % (ref 0.0–0.2)

## 2020-11-03 LAB — BASIC METABOLIC PANEL
Anion gap: 6 (ref 5–15)
BUN: 13 mg/dL (ref 8–23)
CO2: 27 mmol/L (ref 22–32)
Calcium: 9.2 mg/dL (ref 8.9–10.3)
Chloride: 106 mmol/L (ref 98–111)
Creatinine, Ser: 0.92 mg/dL (ref 0.44–1.00)
GFR, Estimated: >60 mL/min (ref >60)
Glucose, Bld: 101 mg/dL — ABNORMAL HIGH (ref 70–99)
Potassium: 3.8 mmol/L (ref 3.5–5.1)
Sodium: 139 mmol/L (ref 135–145)

## 2020-11-03 LAB — TROPONIN I (HIGH SENSITIVITY): Troponin I (High Sensitivity): 1837 ng/L (ref <18)

## 2020-11-03 LAB — ECHOCARDIOGRAM COMPLETE
Area-P 1/2: 2.95 cm2
S' Lateral: 3.9 cm

## 2020-11-03 MED ORDER — METOPROLOL SUCCINATE ER 25 MG PO TB24: 25.0000 mg | ORAL_TABLET | Freq: Every day | ORAL | Status: DC

## 2020-11-03 MED ORDER — PERFLUTREN LIPID MICROSPHERE
1.0000 mL | INTRAVENOUS | Status: AC | PRN
  Administered 2020-11-03: 4 mL via INTRAVENOUS
  Filled 2020-11-03: qty 10

## 2020-11-03 MED ORDER — SACUBITRIL-VALSARTAN 24-26 MG PO TABS
1.0000 | ORAL_TABLET | Freq: Two times a day (BID) | ORAL | Status: DC
  Administered 2020-11-03 – 2020-11-04 (×3): 1 via ORAL
  Filled 2020-11-03 (×5): qty 1

## 2020-11-03 NOTE — Discharge Instructions (Signed)

## 2020-11-03 NOTE — Progress Notes (Signed)
Echocardiogram 2D Echocardiogram has been performed.  Brenda [name] Brenda [name] 11/03/2020, 10:24 AM

## 2020-11-03 NOTE — Plan of Care (Signed)

## 2020-11-03 NOTE — Progress Notes (Signed)
Progress Note  Patient Name: Brenda [name] Date of Encounter: 11/03/2020  [facility] Cardiologist: Reatha Harps, MD   Subjective   No angina or dyspnea. No problems at cath site.  Inpatient Medications    Scheduled Meds: . ARIPiprazole  5 mg Oral Daily  . aspirin EC  81 mg Oral Daily  . atorvastatin  40 mg Oral QHS  . Chlorhexidine Gluconate Cloth  6 each Topical Q0600  . empagliflozin  10 mg Oral QAC breakfast  . enoxaparin (LOVENOX) injection  40 mg Subcutaneous Q24H  . escitalopram  30 mg Oral Daily  . famotidine  20 mg Oral QHS  . fluticasone  1 spray Each Nare BID  . fluticasone furoate-vilanterol  1 puff Inhalation Daily  . gabapentin  600 mg Oral q AM  . gabapentin  900 mg Oral QHS  . insulin aspart  0-15 Units Subcutaneous TID WC  . insulin aspart  0-5 Units Subcutaneous QHS  . loratadine  10 mg Oral QHS  . metoprolol succinate  25 mg Oral Daily  . sodium chloride flush  3 mL Intravenous Q12H  . ticagrelor  90 mg Oral BID  . umeclidinium bromide  1 puff Inhalation Daily   Continuous Infusions: . sodium chloride     PRN Meds: sodium chloride, acetaminophen, albuterol, benzonatate, dextromethorphan-guaiFENesin, LORazepam, ondansetron (ZOFRAN) IV, oxyCODONE, sodium chloride flush   Vital Signs    Vitals:   11/03/20 0500 11/03/20 0600 11/03/20 0800 11/03/20 0806  BP: (!) 120/56 125/62 (!) 120/106   Pulse: (!) 57 (!) 55 (!) 58   Resp: 16 16 15    Temp:    97.9 F (36.6 C)  TempSrc:    Oral  SpO2: 98% 97% 97% 95%    Intake/Output Summary (Last 24 hours) at 11/03/2020 0859 Last data filed at 11/03/2020 0600 Gross per 24 hour  Intake 491.56 ml  Output 850 ml  Net -358.44 ml   Last 3 Weights 10/20/2020 10/08/2020 10/01/2020  Weight (lbs) 225 lb 235 lb 235 lb 8 oz  Weight (kg) 102.059 kg 106.595 kg 106.822 kg      Telemetry    NSR - Personally Reviewed  ECG    Sinus Brady, evolving inferior ST-T changes - Personally Reviewed  Physical Exam   Obese GEN: No acute distress.   Neck: No JVD Cardiac: RRR, no murmurs, rubs, S4 is present.  Respiratory: Clear to auscultation bilaterally. GI: Soft, nontender, non-distended  MS: No edema; healthy R radial access site. L leg in ortho boot. Neuro:  Nonfocal  Psych: Normal affect   Labs    High Sensitivity Troponin:   Recent Labs  Lab 11/02/20 1423 11/02/20 2148 11/03/20 0719  TROPONINIHS 107* 2,530* 1,837*      Chemistry Recent Labs  Lab 11/02/20 1423 11/02/20 1435 11/03/20 0719  NA 139 137 139  K 3.5 3.2* 3.8  CL 101 97* 106  CO2 28  --  27  GLUCOSE 155* 122* 101*  BUN 14 17 13   CREATININE 1.01* 0.90 0.92  CALCIUM 9.3  --  9.2  PROT 7.8  --   --   ALBUMIN 3.6  --   --   AST 18  --   --   ALT 15  --   --   ALKPHOS 101  --   --   BILITOT 0.6  --   --   GFRNONAA >60  --  >60  ANIONGAP 10  --  6     Hematology  Recent Labs  Lab 11/02/20 1423 11/02/20 1435 11/03/20 0719  WBC 18.5*  --  14.1*  RBC 4.76  --  4.51  HGB 13.9 13.3 13.3  HCT 44.1 39.0 41.6  MCV 92.6  --  92.2  MCH 29.2  --  29.5  MCHC 31.5  --  32.0  RDW 14.2  --  14.5  PLT 369  --  312    BNPNo results for input(s): BNP, PROBNP in the last 168 hours.   DDimer No results for input(s): DDIMER in the last 168 hours.   Radiology    CARDIAC CATHETERIZATION  Result Date: 11/02/2020 Conclusions: 1. Severe single-vessel coronary artery disease with 95% thrombotic stenosis of distal RCA and TIMI-2 flow into its distal branches. 2. Nonobstructive LAD/LCx disease.  The distal LAD tapers to a small vessel, terminating before the apex. 3. Normal left ventricular filling pressure (LVEDP 12 mmHg). 4. Successful PCI to distal RCA using Synergy 3.5 x 20 mm drug-eluting stent (postdilated to 4.1 mm) with 0% residual stenosis and TIMI-3 flow. Recommendations: 1. Dual antiplatelet therapy with aspirin and ticagrelor for at least 12 months. 2. Aggressive secondary prevention, including high intensity statin  therapy and tobacco cessation. 3. Obtain transthoracic echocardiogram. Yvonne Kendall, MD [facility]    Cardiac Studies   Result Date: 11/02/2020 Conclusions: 1. Severe single-vessel coronary artery disease with 95% thrombotic stenosis of distal RCA and TIMI-2 flow into its distal branches. 2. Nonobstructive LAD/LCx disease.  The distal LAD tapers to a small vessel, terminating before the apex. 3. Normal left ventricular filling pressure (LVEDP 12 mmHg). 4. Successful PCI to distal RCA using Synergy 3.5 x 20 mm drug-eluting stent (postdilated to 4.1 mm) with 0% residual stenosis and TIMI-3 flow. Recommendations: 1. Dual antiplatelet therapy with aspirin and ticagrelor for at least 12 months. 2. Aggressive secondary prevention, including high intensity statin therapy and tobacco cessation. 3. Obtain transthoracic echocardiogram. Yvonne Kendall, MD [facility]   Diagnostic Dominance: Right    Intervention     Implants    Permanent Stent   Synergy Xd 3.50x20 - [id number] - Implanted      ECHO 06/28/2020 1. Left ventricular ejection fraction, by estimation, is 55 to 60%. The  left ventricle has normal function. The left ventricle has no regional  wall motion abnormalities. Left ventricular diastolic parameters are  consistent with Grade I diastolic  dysfunction (impaired relaxation).  2. Right ventricular systolic function is normal. The right ventricular  size is normal.  3. The mitral valve is normal in structure. No evidence of mitral valve  regurgitation. No evidence of mitral stenosis.  4. The aortic valve is normal in structure. Aortic valve regurgitation is  not visualized. No aortic stenosis is present.  5. The inferior vena cava is normal in size with greater than 50%  respiratory variability, suggesting right atrial pressure of 3 mmHg.   Patient Profile     60 y.o. female with history of stress-induced cardiomyopathy (01/2020), stroke, COPD on chronic  oxygen, hypertension, hyperlipidemia, and type 2 diabetes mellitus presenting with inferior STEMI 11/02/2020, , s/p PCI-DES RCA (nonobstructive LCX and LAD disease).  Assessment & Plan    1. CAD: Had some issues with bradycardia and hypotension during the intervention on the RCA, improved.  Restarting her beta-blocker and Entresto.  Echocardiogram pending.  Discussed mandatory dual antiplatelet therapy for the next 12 months.  She is on high-dose statin well as well within target range at 49. Cardiac rehab. Possible DC later today. 2.  CMP/ HF recovered EF: Clinically euvolemic.  Had normal LVEDP during cath yesterday.  Resume beta-blocker and Arni.  On Jardiance. 3. DM: excellent control, A1c 5.8%  UDS +ve THC, but no stimulants.   For questions or updates, please contact [facility] Please consult [email or web address] for contact info under        Signed, Thurmon Fair, MD  11/03/2020, 8:59 AM

## 2020-11-04 ENCOUNTER — Other Ambulatory Visit (HOSPITAL_COMMUNITY): Payer: Self-pay

## 2020-11-04 ENCOUNTER — Encounter (HOSPITAL_COMMUNITY): Payer: Self-pay | Admitting: Internal Medicine

## 2020-11-04 DIAGNOSIS — F17210 Nicotine dependence, cigarettes, uncomplicated: Secondary | ICD-10-CM

## 2020-11-04 DIAGNOSIS — I10 Essential (primary) hypertension: Secondary | ICD-10-CM

## 2020-11-04 DIAGNOSIS — N1831 Chronic kidney disease, stage 3a: Secondary | ICD-10-CM

## 2020-11-04 LAB — HEMOGLOBIN A1C
Hgb A1c MFr Bld: 5.8 % — ABNORMAL HIGH (ref 4.8–5.6)
Mean Plasma Glucose: 120 mg/dL

## 2020-11-04 LAB — GLUCOSE, CAPILLARY
Glucose-Capillary: 106 mg/dL — ABNORMAL HIGH (ref 70–99)
Glucose-Capillary: 133 mg/dL — ABNORMAL HIGH (ref 70–99)
Glucose-Capillary: 98 mg/dL (ref 70–99)

## 2020-11-04 MED ORDER — NITROGLYCERIN 0.4 MG SL SUBL: 0.4000 mg | SUBLINGUAL_TABLET | SUBLINGUAL | 2 refills | Status: AC | PRN

## 2020-11-04 MED ORDER — ATORVASTATIN CALCIUM 80 MG PO TABS: 80.0000 mg | ORAL_TABLET | Freq: Every day | ORAL | 0 refills | Status: DC

## 2020-11-04 MED ORDER — SPIRONOLACTONE 12.5 MG HALF TABLET
12.5000 mg | ORAL_TABLET | Freq: Every day | ORAL | Status: DC
  Administered 2020-11-04: 12.5 mg via ORAL
  Filled 2020-11-04: qty 1

## 2020-11-04 MED ORDER — TICAGRELOR 90 MG PO TABS: 90.0000 mg | ORAL_TABLET | Freq: Two times a day (BID) | ORAL | 2 refills | Status: DC

## 2020-11-04 MED ORDER — ATORVASTATIN CALCIUM 80 MG PO TABS: 80.0000 mg | ORAL_TABLET | Freq: Every day | ORAL | Status: DC

## 2020-11-04 NOTE — Progress Notes (Signed)
Progress Note  Patient Name: Brenda [name] Date of Encounter: 11/04/2020  [facility] Cardiologist: Reatha Harps, MD   Subjective   No angina or dyspnea. No problems at cath site. States she feels the best she has in weeks. Very motivated to quit smoking.   Inpatient Medications    Scheduled Meds: . ARIPiprazole  5 mg Oral Daily  . aspirin EC  81 mg Oral Daily  . atorvastatin  80 mg Oral QHS  . Chlorhexidine Gluconate Cloth  6 each Topical Q0600  . empagliflozin  10 mg Oral QAC breakfast  . enoxaparin (LOVENOX) injection  40 mg Subcutaneous Q24H  . escitalopram  30 mg Oral Daily  . famotidine  20 mg Oral QHS  . fluticasone  1 spray Each Nare BID  . fluticasone furoate-vilanterol  1 puff Inhalation Daily  . gabapentin  600 mg Oral q AM  . gabapentin  900 mg Oral QHS  . insulin aspart  0-15 Units Subcutaneous TID WC  . insulin aspart  0-5 Units Subcutaneous QHS  . loratadine  10 mg Oral QHS  . metoprolol succinate  25 mg Oral Daily  . sacubitril-valsartan  1 tablet Oral BID  . sodium chloride flush  3 mL Intravenous Q12H  . spironolactone  12.5 mg Oral Daily  . ticagrelor  90 mg Oral BID  . umeclidinium bromide  1 puff Inhalation Daily   Continuous Infusions: . sodium chloride     PRN Meds: sodium chloride, acetaminophen, albuterol, benzonatate, dextromethorphan-guaiFENesin, LORazepam, ondansetron (ZOFRAN) IV, oxyCODONE, sodium chloride flush   Vital Signs    Vitals:   11/04/20 0300 11/04/20 0400 11/04/20 0500 11/04/20 0740  BP: 114/66 119/65 (!) 117/56   Pulse: 61 65 (!) 55 78  Resp: 15 18 15 18   Temp:      TempSrc:      SpO2: 96% 96% 97%     Intake/Output Summary (Last 24 hours) at 11/04/2020 0748 Last data filed at 11/03/2020 1800 Gross per 24 hour  Intake --  Output 1700 ml  Net -1700 ml   Last 3 Weights 10/20/2020 10/08/2020 10/01/2020  Weight (lbs) 225 lb 235 lb 235 lb 8 oz  Weight (kg) 102.059 kg 106.595 kg 106.822 kg      Telemetry    NSR -  Personally Reviewed  ECG    Sinus Brady, evolving inferior ST-T changes - Personally Reviewed  Physical Exam   Obese GEN: No acute distress.   Neck: No JVD Cardiac: RRR, no murmurs, rubs, S4 is present.  Respiratory: Clear to auscultation bilaterally. GI: Soft, nontender, non-distended  MS: No edema; healthy R radial access site. L leg in ortho boot. Neuro:  Nonfocal  Psych: Normal affect   Labs    High Sensitivity Troponin:   Recent Labs  Lab 11/02/20 1423 11/02/20 2148 11/03/20 0719  TROPONINIHS 107* 2,530* 1,837*      Chemistry Recent Labs  Lab 11/02/20 1423 11/02/20 1435 11/03/20 0719  NA 139 137 139  K 3.5 3.2* 3.8  CL 101 97* 106  CO2 28  --  27  GLUCOSE 155* 122* 101*  BUN 14 17 13   CREATININE 1.01* 0.90 0.92  CALCIUM 9.3  --  9.2  PROT 7.8  --   --   ALBUMIN 3.6  --   --   AST 18  --   --   ALT 15  --   --   ALKPHOS 101  --   --   BILITOT 0.6  --   --  GFRNONAA >60  --  >60  ANIONGAP 10  --  6     Hematology Recent Labs  Lab 11/02/20 1423 11/02/20 1435 11/03/20 0719  WBC 18.5*  --  14.1*  RBC 4.76  --  4.51  HGB 13.9 13.3 13.3  HCT 44.1 39.0 41.6  MCV 92.6  --  92.2  MCH 29.2  --  29.5  MCHC 31.5  --  32.0  RDW 14.2  --  14.5  PLT 369  --  312    BNPNo results for input(s): BNP, PROBNP in the last 168 hours.   DDimer No results for input(s): DDIMER in the last 168 hours.   Radiology    CARDIAC CATHETERIZATION  Result Date: 11/02/2020 Conclusions: 1. Severe single-vessel coronary artery disease with 95% thrombotic stenosis of distal RCA and TIMI-2 flow into its distal branches. 2. Nonobstructive LAD/LCx disease.  The distal LAD tapers to a small vessel, terminating before the apex. 3. Normal left ventricular filling pressure (LVEDP 12 mmHg). 4. Successful PCI to distal RCA using Synergy 3.5 x 20 mm drug-eluting stent (postdilated to 4.1 mm) with 0% residual stenosis and TIMI-3 flow. Recommendations: 1. Dual antiplatelet therapy with  aspirin and ticagrelor for at least 12 months. 2. Aggressive secondary prevention, including high intensity statin therapy and tobacco cessation. 3. Obtain transthoracic echocardiogram. Yvonne Kendallhristopher End, MD [facility]   ECHOCARDIOGRAM COMPLETE  Result Date: 11/03/2020    ECHOCARDIOGRAM REPORT   Patient Name:   Brenda [name] Date of Exam: 11/03/2020 Medical Rec #:  [id number]     Height:       60.0 in Accession #:    [id number]    Weight:       225.0 lb Date of Birth:  07-26-1960     BSA:          1.962 m Patient Age:    60 years      BP:           113/61 mmHg Patient Gender: F             HR:           62 bpm. Exam Location:  Inpatient Procedure: 2D Echo, Color Doppler, Cardiac Doppler and Intracardiac            Opacification Agent Indications:    Acute MI i21.9  History:        Patient has prior history of Echocardiogram examinations, most                 recent 06/28/2020. CAD, COPD; Risk Factors:Hypertension and Sleep                 Apnea.  Sonographer:    Irving BurtonEmily Senior RDCS Referring Phys: [phone] CHRISTOPHER END IMPRESSIONS  1. Left ventricular ejection fraction, by estimation, is 45 to 50%. The left ventricle has mildly decreased function. The left ventricle demonstrates regional wall motion abnormalities (see scoring diagram/findings for description). Left ventricular diastolic parameters are consistent with Grade II diastolic dysfunction (pseudonormalization). Elevated left atrial pressure.  2. Right ventricular systolic function is normal. The right ventricular size is normal.  3. The mitral valve is normal in structure. No evidence of mitral valve regurgitation. No evidence of mitral stenosis.  4. The aortic valve is normal in structure. Aortic valve regurgitation is not visualized. No aortic stenosis is present.  5. The inferior vena cava is dilated in size with >50% respiratory variability, suggesting right atrial pressure of 8 mmHg. Comparison(s): Prior  images reviewed side by side. The left ventricular  function is worsened. The left ventricular diastolic function is significantly worse. The left ventricular wall motion abnormality is are new. FINDINGS  Left Ventricle: Left ventricular ejection fraction, by estimation, is 45 to 50%. The left ventricle has mildly decreased function. The left ventricle demonstrates regional wall motion abnormalities. Moderate hypokinesis of the left ventricular, basal-mid inferoseptal wall and inferior wall. Definity contrast agent was given IV to delineate the left ventricular endocardial borders. The left ventricular internal cavity size was normal in size. There is no left ventricular hypertrophy. Left ventricular diastolic parameters are consistent with Grade II diastolic dysfunction (pseudonormalization). Elevated left atrial pressure. Right Ventricle: The right ventricular size is normal. No increase in right ventricular wall thickness. Right ventricular systolic function is normal. Left Atrium: Left atrial size was normal in size. Right Atrium: Right atrial size was normal in size. Pericardium: There is no evidence of pericardial effusion. Mitral Valve: The mitral valve is normal in structure. No evidence of mitral valve regurgitation. No evidence of mitral valve stenosis. Tricuspid Valve: The tricuspid valve is normal in structure. Tricuspid valve regurgitation is not demonstrated. No evidence of tricuspid stenosis. Aortic Valve: The aortic valve is normal in structure. Aortic valve regurgitation is not visualized. No aortic stenosis is present. Pulmonic Valve: The pulmonic valve was normal in structure. Pulmonic valve regurgitation is not visualized. No evidence of pulmonic stenosis. Aorta: The aortic root is normal in size and structure. Venous: The inferior vena cava is dilated in size with greater than 50% respiratory variability, suggesting right atrial pressure of 8 mmHg. IAS/Shunts: No atrial level shunt detected by color flow Doppler.  LEFT VENTRICLE PLAX 2D LVIDd:          5.10 cm  Diastology LVIDs:         3.90 cm  LV e' medial:    7.07 cm/s LV PW:         0.90 cm  LV E/e' medial:  14.6 LV IVS:        0.80 cm  LV e' lateral:   11.10 cm/s LVOT diam:     2.10 cm  LV E/e' lateral: 9.3 LV SV:         77 LV SV Index:   39 LVOT Area:     3.46 cm  RIGHT VENTRICLE RV S prime:     11.60 cm/s TAPSE (M-mode): 2.4 cm LEFT ATRIUM             Index       RIGHT ATRIUM           Index LA diam:        3.40 cm 1.73 cm/m  RA Area:     16.40 cm LA Vol (A2C):   47.0 ml 23.95 ml/m RA Volume:   42.40 ml  21.61 ml/m LA Vol (A4C):   39.8 ml 20.28 ml/m LA Biplane Vol: 46.5 ml 23.70 ml/m  AORTIC VALVE LVOT Vmax:   101.00 cm/s LVOT Vmean:  77.500 cm/s LVOT VTI:    0.221 m  AORTA Ao Root diam: 2.70 cm Ao Asc diam:  3.50 cm MITRAL VALVE MV Area (PHT): 2.95 cm     SHUNTS MV Decel Time: 257 msec     Systemic VTI:  0.22 m MV E velocity: 103.00 cm/s  Systemic Diam: 2.10 cm MV A velocity: 56.30 cm/s MV E/A ratio:  1.83 Mihai Croitoru MD Electronically signed by Thurmon Fair MD Signature Date/Time: 11/03/2020/11:24:42 AM  Final     Cardiac Studies   Result Date: 11/02/2020 Conclusions: 1. Severe single-vessel coronary artery disease with 95% thrombotic stenosis of distal RCA and TIMI-2 flow into its distal branches. 2. Nonobstructive LAD/LCx disease.  The distal LAD tapers to a small vessel, terminating before the apex. 3. Normal left ventricular filling pressure (LVEDP 12 mmHg). 4. Successful PCI to distal RCA using Synergy 3.5 x 20 mm drug-eluting stent (postdilated to 4.1 mm) with 0% residual stenosis and TIMI-3 flow. Recommendations: 1. Dual antiplatelet therapy with aspirin and ticagrelor for at least 12 months. 2. Aggressive secondary prevention, including high intensity statin therapy and tobacco cessation. 3. Obtain transthoracic echocardiogram. Yvonne Kendall, MD [facility]   Diagnostic Dominance: Right    Intervention     Implants    Permanent Stent   Synergy Xd  3.50x20 - [id number] - Implanted      ECHO 06/28/2020 1. Left ventricular ejection fraction, by estimation, is 55 to 60%. The  left ventricle has normal function. The left ventricle has no regional  wall motion abnormalities. Left ventricular diastolic parameters are  consistent with Grade I diastolic  dysfunction (impaired relaxation).  2. Right ventricular systolic function is normal. The right ventricular  size is normal.  3. The mitral valve is normal in structure. No evidence of mitral valve  regurgitation. No evidence of mitral stenosis.  4. The aortic valve is normal in structure. Aortic valve regurgitation is  not visualized. No aortic stenosis is present.  5. The inferior vena cava is normal in size with greater than 50%  respiratory variability, suggesting right atrial pressure of 3 mmHg.   Echo 11/03/20: 1. Left ventricular ejection fraction, by estimation, is 45 to 50%. The left ventricle has mildly decreased function. The left ventricle demonstrates regional wall motion abnormalities (see scoring diagram/findings for description). Left ventricular diastolic parameters are consistent with Grade II diastolic dysfunction (pseudonormalization). Elevated left atrial pressure. 2. Right ventricular systolic function is normal. The right ventricular size is normal. 3. The mitral valve is normal in structure. No evidence of mitral valve regurgitation. No evidence of mitral stenosis. 4. The aortic valve is normal in structure. Aortic valve regurgitation is not visualized. No aortic stenosis is present. 5. The inferior vena cava is dilated in size with >50% respiratory variability, suggesting right atrial pressure of 8 mmHg. Comparison(s): Prior images reviewed side by side. The left ventricular function is worsened. The left ventricular diastolic function is significantly worse. The left ventricular wall motion abnormality is are new.  Patient Profile     60 y.o. female with  history of stress-induced cardiomyopathy (01/2020), stroke, COPD on chronic oxygen, tobacco abuse, hypertension, hyperlipidemia, and type 2 diabetes mellitus presenting with inferior STEMI 11/02/2020, , s/p PCI-DES RCA (nonobstructive LCX and LAD disease).  Assessment & Plan    1. CAD: s/p emergent stenting of the distal RCA. Peak troponin 2500. Ecg shows resolution of ST elevation. EF 45-50% with inferior HK.  Resuming  beta-blocker,  Entresto, and aldactone.  Discussed mandatory dual antiplatelet therapy for the next 12 months.  She is on high-dose statin well as well within target range at 49. Cardiac rehab. Plan DC later today. 2. CMP/ HF recovered EF: Clinically euvolemic.  Had normal LVEDP during cath yesterday.  Resume beta-blocker and ARNI.  On Jardiance. Resume aldactone 12.5 mg daily.  3. DM: excellent control, A1c 5.8% 4. Tobacco abuse. Patient is very motivated to quit now.   UDS +ve THC, but no stimulants.   For  questions or updates, please contact [facility] Please consult [email or web address] for contact info under        Signed, Gayatri Teasdale [place], MD  11/04/2020, 7:48 AM

## 2020-11-04 NOTE — Discharge Summary (Signed)
Discharge Summary    Patient ID: [name] MRN: [id number]; DOB: [birth year]  Admit date: 11/02/2020 Discharge date: 11/04/2020  PCP:  Brenda Boyden, MD   [facility] Providers Cardiologist:  Reatha Harps, MD     Discharge Diagnoses    Principal Problem:   STEMI (ST elevation myocardial infarction) [facility]) Active Problems:   Cigarette smoker   HYPERTENSION, BENIGN ESSENTIAL   Type 2 diabetes, controlled, with peripheral neuropathy (HCC)   Chronic kidney disease, stage 3a (HCC)   STEMI involving right coronary artery [facility])  Diagnostic Studies/Procedures    Cath: 11/02/20  Conclusions: 1. Severe single-vessel coronary artery disease with 95% thrombotic stenosis of distal RCA and TIMI-2 flow into its distal branches. 2. Nonobstructive LAD/LCx disease.  The distal LAD tapers to a small vessel, terminating before the apex. 3. Normal left ventricular filling pressure (LVEDP 12 mmHg). 4. Successful PCI to distal RCA using Synergy 3.5 x 20 mm drug-eluting stent (postdilated to 4.1 mm) with 0% residual stenosis and TIMI-3 flow.  Recommendations: 1. Dual antiplatelet therapy with aspirin and ticagrelor for at least 12 months. 2. Aggressive secondary prevention, including high intensity statin therapy and tobacco cessation. 3. Obtain transthoracic echocardiogram.  Yvonne Kendall, MD [facility]  Diagnostic Dominance: Right    Intervention     Echo: 11/03/20  IMPRESSIONS    1. Left ventricular ejection fraction, by estimation, is 45 to 50%. The  left ventricle has mildly decreased function. The left ventricle  demonstrates regional wall motion abnormalities (see scoring  diagram/findings for description). Left ventricular  diastolic parameters are consistent with Grade II diastolic dysfunction  (pseudonormalization). Elevated left atrial pressure.  2. Right ventricular systolic function is normal. The right ventricular  size is normal.  3. The mitral  valve is normal in structure. No evidence of mitral valve  regurgitation. No evidence of mitral stenosis.  4. The aortic valve is normal in structure. Aortic valve regurgitation is  not visualized. No aortic stenosis is present.  5. The inferior vena cava is dilated in size with >50% respiratory  variability, suggesting right atrial pressure of 8 mmHg.   Comparison(s): Prior images reviewed side by side. The left ventricular  function is worsened. The left ventricular diastolic function is  significantly worse. The left ventricular wall motion abnormality is are  new.  _____________   History of Present Illness     Brenda [name] is a 60 y.o. female with history ofstress-induced cardiomyopathy (01/2020), stroke, COPD on chronic oxygen, tobacco abuse, hypertension, hyperlipidemia, andtype 2 diabetes mellitus with presented with chest pain and inferior STEMI.   Ms. Sem reported sudden onset of substernal chest pain the day prior to admission around noon that had waxed and waned in intensity (up to 10/10).  It had been associated with intermittent nausea/vomiting and shortness of breath.  Pain radiated to the left arm.  She did not seek medical attention until her daughters called 911 the day of admission due to continued symptoms. She was found to have inferior ST segment elevation when EMS arrived.  She had received ASA, NTG, and ondansetron.  She was also given a heparin bolus in the ED.  Chest pain remained 3/10 with associated left arm discomfort in the ED.  Patient was therefore referred for emergent left heart catheterization and possible PCI.  Hospital Course    1.  STEMI: High-sensitivity troponin peaked at 2530.  Underwent PCI/DES x1 to distal RCA for 95% thrombotic stenosis.  Noted to have nonobstructive LAD/left circumflex  disease to be treated medically.  Was placed on DAPT with aspirin/Brilinta for 1 year.  Follow-up EKG showed resolution of ST elevation.  Echocardiogram this  admission showed an EF of 45 to 50% with inferior hypokinesis.  She was resumed on her home doses of beta-blocker, Entresto and Aldactone.  Was evaluated by cardiac rehab prior to discharge.  2.  Cardiomyopathy/recovered EF: Initially had an EF of 30 to 35% back last year in the setting of acute CVA.  This recovered with guideline directed medical therapy.  Echo this admission showed EF of 45 to 50% with inferior hypokinesis. -- She was resumed on beta-blocker, Entresto and Aldactone prior to discharge. -- Resumed PTA Jardiance  3.  Diabetes: Recent A1c of 5.8  4.  Hyperlipidemia: LDL 49 -- Lipitor was increased from 40 mg daily to 80 mg daily  5.  Tobacco use: Cessation advised, she is highly motivated to quit  Patient was seen by Dr. [place] and deemed stable for discharge home.  Follow-up in the office has been arranged (1st available appt 7/7 with Dr. Flora Lipps).  Medications are sent to patient's pharmacy of choice.  She was educated by [employer].D. prior to discharge.  Did the patient have an acute coronary syndrome (MI, NSTEMI, STEMI, etc) this admission?:  Yes                               AHA/ACC Clinical Performance & Quality Measures: 1. Aspirin prescribed? - Yes 2. ADP Receptor Inhibitor (Plavix/Clopidogrel, Brilinta/Ticagrelor or Effient/Prasugrel) prescribed (includes medically managed patients)? - Yes 3. Beta Blocker prescribed? - Yes 4. High Intensity Statin (Lipitor 40-80mg  or Crestor 20-40mg ) prescribed? - Yes 5. EF assessed during THIS hospitalization? - Yes 6. For EF <40%, was ACEI/ARB prescribed? - Yes 7. For EF <40%, Aldosterone Antagonist (Spironolactone or Eplerenone) prescribed? - Yes 8. Cardiac Rehab Phase II ordered (including medically managed patients)? - Yes  _____________  Discharge Vitals Blood pressure (!) 117/56, pulse 78, temperature 97.9 F (36.6 C), temperature source Oral, resp. rate 18, last menstrual period 03/08/2013, SpO2 97 %.  There were no vitals  filed for this visit.  Labs & Radiologic Studies    CBC Recent Labs    11/02/20 1423 11/02/20 1435 11/03/20 0719  WBC 18.5*  --  14.1*  HGB 13.9 13.3 13.3  HCT 44.1 39.0 41.6  MCV 92.6  --  92.2  PLT 369  --  312   Basic Metabolic Panel Recent Labs    16/10/96 1423 11/02/20 1435 11/03/20 0719  NA 139 137 139  K 3.5 3.2* 3.8  CL 101 97* 106  CO2 28  --  27  GLUCOSE 155* 122* 101*  BUN CREATININE 1.01* 0.90 0.92  CALCIUM 9.3  --  9.2   Liver Function Tests Recent Labs    11/02/20 1423  AST 18  ALT 15  ALKPHOS 101  BILITOT 0.6  PROT 7.8  ALBUMIN 3.6   No results for input(s): LIPASE, AMYLASE in the last 72 hours. High Sensitivity Troponin:   Recent Labs  Lab 11/02/20 1423 11/02/20 2148 11/03/20 0719  TROPONINIHS 107* 2,530* 1,837*    BNP Invalid input(s): POCBNP D-Dimer No results for input(s): DDIMER in the last 72 hours. Hemoglobin A1C No results for input(s): HGBA1C in the last 72 hours. Fasting Lipid Panel Recent Labs    11/02/20 1423  CHOL 118  HDL 42  LDLCALC 49  TRIG  135  CHOLHDL 2.8   Thyroid Function Tests No results for input(s): TSH, T4TOTAL, T3FREE, THYROIDAB in the last 72 hours.  Invalid input(s): FREET3 _____________  Varney BilesG Ankle Complete Left  Result Date: 10/20/2020 CLINICAL DATA:  Left ankle pain after fall EXAM: LEFT ANKLE COMPLETE - 3+ VIEW COMPARISON:  None. FINDINGS: Acute obliquely oriented fracture of the distal fibular metaphysis. No significant displacement or angulation. Alignment at the ankle mortise remains congruent. Small osseous fragment inferior to the tip of the medial malleolus which could represent an acute avulsion injury or sequela of remote trauma. Mild-moderate tibiotalar osteoarthritis with marginal osteophyte formation. Os trigonum versus intra-articular loose body at the posterior joint line. Small plantar calcaneal spur. Diffuse soft tissue swelling about the ankle, most pronounced laterally.  IMPRESSION: 1. Acute nondisplaced fracture of the distal fibular metaphysis. 2. Small osseous fragment inferior to the tip of the medial malleolus which could represent an acute avulsion injury or sequela of remote trauma. Correlate with point tenderness. Electronically Signed   By: Duanne GuessNicholas  Plundo D.O.   On: 10/20/2020 16:02   CARDIAC CATHETERIZATION  Result Date: 11/02/2020 Conclusions: 1. Severe single-vessel coronary artery disease with 95% thrombotic stenosis of distal RCA and TIMI-2 flow into its distal branches. 2. Nonobstructive LAD/LCx disease.  The distal LAD tapers to a small vessel, terminating before the apex. 3. Normal left ventricular filling pressure (LVEDP 12 mmHg). 4. Successful PCI to distal RCA using Synergy 3.5 x 20 mm drug-eluting stent (postdilated to 4.1 mm) with 0% residual stenosis and TIMI-3 flow. Recommendations: 1. Dual antiplatelet therapy with aspirin and ticagrelor for at least 12 months. 2. Aggressive secondary prevention, including high intensity statin therapy and tobacco cessation. 3. Obtain transthoracic echocardiogram. Yvonne Kendallhristopher End, MD [facility]   MM 3D SCREEN BREAST BILATERAL  Result Date: 10/07/2020 CLINICAL DATA:  Screening. EXAM: DIGITAL SCREENING BILATERAL MAMMOGRAM WITH TOMOSYNTHESIS AND CAD TECHNIQUE: Bilateral screening digital craniocaudal and mediolateral oblique mammograms were obtained. Bilateral screening digital breast tomosynthesis was performed. The images were evaluated with computer-aided detection. COMPARISON:  Previous exam(s). ACR Breast Density Category b: There are scattered areas of fibroglandular density. FINDINGS: There are no findings suspicious for malignancy. The images were evaluated with computer-aided detection. IMPRESSION: No mammographic evidence of malignancy. A result letter of this screening mammogram will be mailed directly to the patient. RECOMMENDATION: Screening mammogram in one year. (Code:SM-B-01Y) BI-RADS CATEGORY  1:  Negative. Electronically Signed   By: Gerome Samavid  Williams III M.D   On: 10/07/2020 09:20   ECHOCARDIOGRAM COMPLETE  Result Date: 11/03/2020    ECHOCARDIOGRAM REPORT   Patient Name:   Brenda [name] Date of Exam: 11/03/2020 Medical Rec #:  [id number]     Height:       60.0 in Accession #:    [id number]    Weight:       225.0 lb Date of Birth:  1961/04/02     BSA:          1.962 m Patient Age:    60 years      BP:           113/61 mmHg Patient Gender: F             HR:           62 bpm. Exam Location:  Inpatient Procedure: 2D Echo, Color Doppler, Cardiac Doppler and Intracardiac            Opacification Agent Indications:    Acute MI i21.9  History:  Patient has prior history of Echocardiogram examinations, most                 recent 06/28/2020. CAD, COPD; Risk Factors:Hypertension and Sleep                 Apnea.  Sonographer:    Irving Burton Senior RDCS Referring Phys: [phone] CHRISTOPHER END IMPRESSIONS  1. Left ventricular ejection fraction, by estimation, is 45 to 50%. The left ventricle has mildly decreased function. The left ventricle demonstrates regional wall motion abnormalities (see scoring diagram/findings for description). Left ventricular diastolic parameters are consistent with Grade II diastolic dysfunction (pseudonormalization). Elevated left atrial pressure.  2. Right ventricular systolic function is normal. The right ventricular size is normal.  3. The mitral valve is normal in structure. No evidence of mitral valve regurgitation. No evidence of mitral stenosis.  4. The aortic valve is normal in structure. Aortic valve regurgitation is not visualized. No aortic stenosis is present.  5. The inferior vena cava is dilated in size with >50% respiratory variability, suggesting right atrial pressure of 8 mmHg. Comparison(s): Prior images reviewed side by side. The left ventricular function is worsened. The left ventricular diastolic function is significantly worse. The left ventricular wall motion abnormality is  are new. FINDINGS  Left Ventricle: Left ventricular ejection fraction, by estimation, is 45 to 50%. The left ventricle has mildly decreased function. The left ventricle demonstrates regional wall motion abnormalities. Moderate hypokinesis of the left ventricular, basal-mid inferoseptal wall and inferior wall. Definity contrast agent was given IV to delineate the left ventricular endocardial borders. The left ventricular internal cavity size was normal in size. There is no left ventricular hypertrophy. Left ventricular diastolic parameters are consistent with Grade II diastolic dysfunction (pseudonormalization). Elevated left atrial pressure. Right Ventricle: The right ventricular size is normal. No increase in right ventricular wall thickness. Right ventricular systolic function is normal. Left Atrium: Left atrial size was normal in size. Right Atrium: Right atrial size was normal in size. Pericardium: There is no evidence of pericardial effusion. Mitral Valve: The mitral valve is normal in structure. No evidence of mitral valve regurgitation. No evidence of mitral valve stenosis. Tricuspid Valve: The tricuspid valve is normal in structure. Tricuspid valve regurgitation is not demonstrated. No evidence of tricuspid stenosis. Aortic Valve: The aortic valve is normal in structure. Aortic valve regurgitation is not visualized. No aortic stenosis is present. Pulmonic Valve: The pulmonic valve was normal in structure. Pulmonic valve regurgitation is not visualized. No evidence of pulmonic stenosis. Aorta: The aortic root is normal in size and structure. Venous: The inferior vena cava is dilated in size with greater than 50% respiratory variability, suggesting right atrial pressure of 8 mmHg. IAS/Shunts: No atrial level shunt detected by color flow Doppler.  LEFT VENTRICLE PLAX 2D LVIDd:         5.10 cm  Diastology LVIDs:         3.90 cm  LV e' medial:    7.07 cm/s LV PW:         0.90 cm  LV E/e' medial:  14.6 LV IVS:         0.80 cm  LV e' lateral:   11.10 cm/s LVOT diam:     2.10 cm  LV E/e' lateral: 9.3 LV SV:         77 LV SV Index:   39 LVOT Area:     3.46 cm  RIGHT VENTRICLE RV S prime:     11.60 cm/s TAPSE (  M-mode): 2.4 cm LEFT ATRIUM             Index       RIGHT ATRIUM           Index LA diam:        3.40 cm 1.73 cm/m  RA Area:     16.40 cm LA Vol (A2C):   47.0 ml 23.95 ml/m RA Volume:   42.40 ml  21.61 ml/m LA Vol (A4C):   39.8 ml 20.28 ml/m LA Biplane Vol: 46.5 ml 23.70 ml/m  AORTIC VALVE LVOT Vmax:   101.00 cm/s LVOT Vmean:  77.500 cm/s LVOT VTI:    0.221 m  AORTA Ao Root diam: 2.70 cm Ao Asc diam:  3.50 cm MITRAL VALVE MV Area (PHT): 2.95 cm     SHUNTS MV Decel Time: 257 msec     Systemic VTI:  0.22 m MV E velocity: 103.00 cm/s  Systemic Diam: 2.10 cm MV A velocity: 56.30 cm/s MV E/A ratio:  1.83 Mihai Croitoru MD Electronically signed by Thurmon Fair MD Signature Date/Time: 11/03/2020/11:24:42 AM    Final    Disposition   Pt is being discharged home today in good condition.  Follow-up Plans & Appointments     Follow-up Information    [facility] Follow up on 12/05/2020.   Why: at 9am for your follow up appt. This is going to be at our NEW [place] location. This the first available appt with Dr. Malen Gauze information: [facility] [address]             Discharge Instructions    AMB Referral to Cardiac Rehabilitation - Phase II   Complete by: As directed    Diagnosis:  STEMI Coronary Stents     After initial evaluation and assessments completed: Virtual Based Care may be provided alone or in conjunction with Phase 2 Cardiac Rehab based on patient barriers.: Yes   Call MD for:  difficulty breathing, headache or visual disturbances   Complete by: As directed    Call MD for:  persistant dizziness or light-headedness   Complete by: As directed    Call MD for:  redness, tenderness, or signs of infection (pain, swelling, redness, odor or green/yellow discharge around  incision site)   Complete by: As directed    Diet - low sodium heart healthy   Complete by: As directed    Discharge instructions   Complete by: As directed    Radial Site Care Refer to this sheet in the next few weeks. These instructions provide you with information on caring for yourself after your procedure. Your caregiver may also give you more specific instructions. Your treatment has been planned according to current medical practices, but problems sometimes occur. Call your caregiver if you have any problems or questions after your procedure. HOME CARE INSTRUCTIONS You may shower the day after the procedure.Remove the bandage (dressing) and gently wash the site with plain soap and water.Gently pat the site dry.  Do not apply powder or lotion to the site.  Do not submerge the affected site in water for 3 to 5 days.  Inspect the site at least twice daily.  Do not flex or bend the affected arm for 24 hours.  No lifting over 5 pounds (2.3 kg) for 5 days after your procedure.  Do not drive home if you are discharged the same day of the procedure. Have someone else drive you.  You may drive 24 hours after the procedure unless otherwise  instructed by your caregiver.  What to expect: Any bruising will usually fade within 1 to 2 weeks.  Blood that collects in the tissue (hematoma) may be painful to the touch. It should usually decrease in size and tenderness within 1 to 2 weeks.  SEEK IMMEDIATE MEDICAL CARE IF: You have unusual pain at the radial site.  You have redness, warmth, swelling, or pain at the radial site.  You have drainage (other than a small amount of blood on the dressing).  You have chills.  You have a fever or persistent symptoms for more than 72 hours.  You have a fever and your symptoms suddenly get worse.  Your arm becomes pale, cool, tingly, or numb.  You have heavy bleeding from the site. Hold pressure on the site.   PLEASE DO NOT MISS ANY DOSES OF YOUR  BRILINTA!!!!! Also keep a log of you blood pressures and bring back to your follow up appt. Please call the office with any questions.   Patients taking blood thinners should generally stay away from medicines like ibuprofen, Advil, Motrin, naproxen, and Aleve due to risk of stomach bleeding. You may take Tylenol as directed or talk to your primary doctor about alternatives.   PLEASE ENSURE THAT YOU DO NOT RUN OUT OF YOUR BRILINTA. This medication is very important to remain on for at least one year. IF you have issues obtaining this medication due to cost please CALL the office 3-5 business days prior to running out in order to prevent missing doses of this medication.   Increase activity slowly   Complete by: As directed       Discharge Medications   Allergies as of 11/04/2020      Reactions   Metolazone Other (See Comments)   Metabolic mood swings      Medication List    STOP taking these medications   oxyCODONE 5 MG immediate release tablet Commonly known as: Roxicodone   promethazine-dextromethorphan 6.25-15 MG/5ML syrup Commonly known as: PROMETHAZINE-DM     TAKE these medications   albuterol (2.5 MG/3ML) 0.083% nebulizer solution Commonly known as: PROVENTIL INHALE 1 VIAL VIA NEBULIZER EVERY 6 HOURS AS NEEDED FOR WHEEZING/SHORTNESS OF BREATH What changed: See the new instructions.   ProAir RespiClick 108 (90 Base) MCG/ACT Aepb Generic drug: Albuterol Sulfate INHALE 2 PUFFS INTO THE LUNGS EVERY 6 HOURS AS NEEDED FOR DYSPNEA/WHEEZE What changed: See the new instructions.   ARIPiprazole 5 MG tablet Commonly known as: ABILIFY TAKE 1 TABLET BY MOUTH EVERY DAY   aspirin 81 MG EC tablet Take 1 tablet (81 mg total) by mouth daily. Swallow whole.   atorvastatin 80 MG tablet Commonly known as: LIPITOR Take 1 tablet (80 mg total) by mouth at bedtime. What changed:   medication strength  how much to take   benzonatate 200 MG capsule Commonly known as: TESSALON Take  1 capsule (200 mg total) by mouth 3 (three) times daily as needed for cough. Swallow whole, to not bite pill   Breztri Aerosphere 160-9-4.8 MCG/ACT Aero Generic drug: Budeson-Glycopyrrol-Formoterol INHALE 2 PUFFS INTO LUNGS 2 TIMES DAILY What changed: See the new instructions.   cetirizine 10 MG tablet Commonly known as: ZYRTEC Take 10 mg by mouth at bedtime.   dextromethorphan-guaiFENesin 30-600 MG 12hr tablet Commonly known as: MUCINEX DM Take 1 tablet by mouth 2 (two) times daily as needed for cough.   empagliflozin 10 MG Tabs tablet Commonly known as: Jardiance Take 1 tablet (10 mg total) by mouth daily before  breakfast.   Entresto 24-26 MG Generic drug: sacubitril-valsartan Take 1 tablet by mouth 2 (two) times daily.   escitalopram 20 MG tablet Commonly known as: LEXAPRO TAKE 1 AND 1/2 TABLETS BY MOUTH DAILY   famotidine 20 MG tablet Commonly known as: PEPCID TAKE 1 TABLET BY MOUTH EVERYDAY AT BEDTIME What changed: See the new instructions.   fluticasone 50 MCG/ACT nasal spray Commonly known as: FLONASE PLACE 1 SPRAY INTO BOTH NOSTRILS 2 (TWO) TIMES DAILY.   furosemide 20 MG tablet Commonly known as: LASIX TAKE 1 TABLET BY MOUTH EVERY DAY   gabapentin 300 MG capsule Commonly known as: NEURONTIN TAKE 2 TABLETS IN THE MORNING AND 3 TABLETS AT NIGHT What changed: See the new instructions.   LORazepam 0.5 MG tablet Commonly known as: ATIVAN TAKE 1/2 TO 1 TABLET BY MOUTH TWICE A DAY AS NEEDED FOR ANXIETY What changed: See the new instructions.   metFORMIN 500 MG tablet Commonly known as: GLUCOPHAGE TAKE 1 TABLET BY MOUTH EVERYDAY AT BEDTIME What changed: See the new instructions.   metoprolol succinate 25 MG 24 hr tablet Commonly known as: TOPROL-XL Take 1 tablet (25 mg total) by mouth daily. What changed: when to take this   multivitamin with minerals Tabs tablet Take 1 tablet by mouth daily.   nitroGLYCERIN 0.4 MG SL tablet Commonly known as:  Nitrostat Place 1 tablet (0.4 mg total) under the tongue every 5 (five) minutes as needed.   nystatin 100000 UNIT/ML suspension Commonly known as: MYCOSTATIN TAKE 1 TEASPOONFUL BY MOUTH 3 TIMES DAILY AS NEEDED FOR MOUTH SORES. What changed:   how much to take  how to take this  when to take this  reasons to take this  additional instructions   oxyCODONE-acetaminophen 5-325 MG tablet Commonly known as: PERCOCET/ROXICET Take 1 tablet by mouth every 4 (four) hours as needed for severe pain.   spironolactone 25 MG tablet Commonly known as: ALDACTONE Take 12.5 mg by mouth daily. What changed: Another medication with the same name was removed. Continue taking this medication, and follow the directions you see here.   ticagrelor 90 MG Tabs tablet Commonly known as: BRILINTA Take 1 tablet (90 mg total) by mouth 2 (two) times daily.   Vitamin D3 25 MCG (1000 UT) Caps Take 1 capsule (1,000 Units total) by mouth daily.       Outstanding Labs/Studies   N/a  Duration of Discharge Encounter   Greater than 30 minutes including physician time.  Signed, Laverda Page, NP 11/04/2020, 10:31 AM

## 2020-11-04 NOTE — Progress Notes (Signed)
CARDIAC REHAB PHASE I   PRE:  Rate/Rhythm: 62 SR  BP:  Supine: 114/92  Sitting:   Standing:    SaO2: 96%3L  MODE:  Ambulation: 40 ft   POST:  Rate/Rhythm: 76 SR  BP:  Supine:   Sitting: 117/66  Standing:    SaO2: 96%2L 0920-1040 Pt has boot on left foot for fracture. Only has been walking in house short distances. Pt walked 40 ft on RA with EVA with steady gait on 2L. Pt stated she wears 2L during the day and 3L at night. Denied CP. To recliner with call bell. Discussed NTG use, MI restrictions, heart healthy and low carb food choices, importance of brilinta with stent and CRP 2. Did not give ex ed due to recent fracture of foot. Did discuss CRP 2 for after recovery from fracture. Referred to [place] program. Discussed smoking cessation and gave handout. Pt has been smoking about 5 cigarettes a day. Knows that she needs to quit. Encouraged her to call [phone]. Pt voiced understanding of all ed.   Luetta Nutting, RN BSN  11/04/2020 10:34 AM

## 2020-11-04 NOTE — TOC Benefit Eligibility Note (Signed)
Patient Product/process development scientist completed.    The patient is currently admitted and upon discharge could be taking Brilinta 90 mg.  The current 30 day co-pay is, $0.00   The patient is insured through [employer] (Medicare Part D/[place] Medicaid)    Roland Earl, CPhT Pharmacy Patient Advocate Specialist [place] [facility] Stewardship Team Direct Number: [phone]  Fax: [phone]

## 2020-11-04 NOTE — Consult Note (Signed)
   [facility] CM Inpatient Consult   11/04/2020  Brenda [name] Feb 16, 1961 [id number]   Patient screened due to noted high risk score for unplanned readmission, 27%. Assessed for post hospital transition of care needs. Patient eligible for [employer] Care Management services [facility] CM) under [facility] plan.  Primary care office, [facility] at [facility], does transition of care post hospital calls.   Of note, [facility] Care Management services does not replace or interfere with any services that are arranged by inpatient case management or social work.  Christophe Louis, MSN, RN [facility] Liaison Nurse Mobile Phone [phone]  Toll free office [phone]

## 2020-11-05 MED FILL — Heparin Sod (Porcine)-NaCl IV Soln 1000 Unit/500ML-0.9%: INTRAVENOUS | Qty: 500 | Status: AC

## 2020-11-05 MED FILL — Nitroglycerin IV Soln 100 MCG/ML in D5W: INTRA_ARTERIAL | Qty: 10 | Status: AC

## 2020-11-10 ENCOUNTER — Encounter: Payer: Self-pay | Admitting: Orthopedic Surgery

## 2020-11-10 NOTE — Progress Notes (Signed)
Office Visit Note   Patient: Brenda [name]           Date of Birth: November 08, 1960           MRN: [id number] Visit Date: 10/31/2020              Requested by: [name], MD [address] [place],  [place] [address] PCP: [name], MD  Chief Complaint  Patient presents with   Left Ankle - Follow-up    ER follow up  10/20/20 left ankle fx       HPI: Patient is a 60 year old woman who presents in follow-up for a nondisplaced Weber B left fibular fracture.  She was in a posterior splint.  Patient states she has bruising swelling has been nonweightbearing.  Patient does have a history of smoking and is currently on nasal cannula FiO2 2 L.  Assessment & Plan: Visit Diagnoses:  1. Pain in left ankle and joints of left foot   2. Ankle fracture, left, closed, initial encounter     Plan: Discussed that with her diabetes and smoking history and nondisplaced fracture would recommend continue with nonoperative therapy.  Patient was placed in a fracture boot repeat evaluation in 2 weeks with repeat three-view radiographs of the left ankle.  Follow-Up Instructions: Return in about 4 weeks (around 11/28/2020).   Ortho Exam  Patient is alert, oriented, no adenopathy, well-dressed, normal affect, normal respiratory effort. Examination patient has a good dorsalis pedis pulse there is bruising and swelling but there is no wound breakdown.  Patient is currently ambulating in a wheelchair.  Imaging: No results found. No images are attached to the encounter.  Labs: Lab Results  Component Value Date   HGBA1C 5.8 (H) 11/02/2020   HGBA1C 5.8 09/24/2020   HGBA1C 6.1 (H) 02/10/2020   ESRSEDRATE 31 (H) 02/09/2020   ESRSEDRATE 39 (H) 02/01/2020   ESRSEDRATE 23 (H) 01/31/2020   CRP 1.7 (H) 02/01/2020   REPTSTATUS 01/31/2020 FINAL 01/31/2020   CULT  05/30/2013    NO GROWTH 5 DAYS Performed at [facility]   LABORGA  01/12/2017    Three or more organisms present,each  greater than 10,000 CFU/mL.These organisms,commonly found on external and internal genitalia,are considered to be colonizers.No further testing performed.      Lab Results  Component Value Date   ALBUMIN 3.6 11/02/2020   ALBUMIN 3.6 09/24/2020   ALBUMIN 3.5 02/07/2020    Lab Results  Component Value Date   MG 2.5 (H) 07/26/2015   MG 2.6 (H) 07/25/2015   MG 2.5 (H) 07/24/2015   Lab Results  Component Value Date   VD25OH 26.05 (L) 09/24/2020    No results found for: PREALBUMIN CBC EXTENDED Latest Ref Rng & Units 11/03/2020 11/02/2020 11/02/2020  WBC 4.0 - 10.5 K/uL 14.1(H) - 18.5(H)  RBC 3.87 - 5.11 MIL/uL 4.51 - 4.76  HGB 12.0 - 15.0 g/dL 77.9 39.0 30.0  HCT 92.3 - 46.0 % 41.6 39.0 44.1  PLT 150 - 400 K/uL 312 - 369  NEUTROABS 1.4 - 7.7 K/uL - - -  LYMPHSABS 0.7 - 4.0 K/uL - - -     There is no height or weight on file to calculate BMI.  Orders:  Orders Placed This Encounter  Procedures   XR Ankle 2 Views Left   Meds ordered this encounter  Medications   oxyCODONE-acetaminophen (PERCOCET/ROXICET) 5-325 MG tablet    Sig: Take 1 tablet by mouth every 4 (four) hours as needed  for severe pain.    Dispense:  30 tablet    Refill:  0     Procedures: No procedures performed  Clinical Data: No additional findings.  ROS:  All other systems negative, except as noted in the HPI. Review of Systems  Objective: Vital Signs: LMP 03/08/2013   Specialty Comments:  No specialty comments available.  PMFS History: Patient Active Problem List   Diagnosis Date Noted   STEMI (ST elevation myocardial infarction) (HCC) 11/02/2020   STEMI involving right coronary artery (HCC) 11/02/2020   Medicare annual wellness visit, initial 10/01/2020   Cannabis dependence (HCC) 04/30/2020   Basilar artery stenosis 02/18/2020   TIA (transient ischemic attack) 02/08/2020   Chronic combined systolic and diastolic CHF (congestive heart failure) (HCC) 02/08/2020   Elevated troponin  02/04/2020   Chronic kidney disease, stage 3a (HCC) 08/07/2019   Knee mass, right 11/28/2018   Advanced care planning/counseling discussion 07/27/2018   Unilateral primary osteoarthritis, left knee 10/28/2017   COPD  GOLD II/III still smoking/ 02 dep  10/20/2017   Cor pulmonale (chronic) (HCC) 09/16/2017   Diastolic dysfunction 08/26/2017   Grief 02/24/2017   Chronic respiratory failure with hypoxia (HCC) 07/31/2015   Type 2 diabetes, controlled, with peripheral neuropathy (HCC)    COPD exacerbation (HCC) 09/14/2014   OSA on CPAP 09/14/2014   Health maintenance examination 09/14/2014   Upper airway cough syndrome 09/22/2013   Chronic pain syndrome 05/30/2013   Migraine, unspecified, without mention of intractable migraine without mention of status migrainosus 05/30/2013   Pyelonephritis 05/05/2012   Asthmatic bronchitis 02/27/2008   Leukocytosis 02/22/2008   NONSPECIFIC ABNORMAL ELECTROCARDIOGRAM 02/22/2008   HYPERTENSION, BENIGN ESSENTIAL 06/23/2007   HYPERTRIGLYCERIDEMIA 06/01/2007   Morbid obesity with BMI of 40.0-44.9, adult (HCC) 06/01/2007   Mood disorder (HCC) 06/01/2007   Cigarette smoker 06/01/2007   Left leg swelling 05/04/2007   Past Medical History:  Diagnosis Date   Acute pancreatitis    Asthma    main dyspnea thought due to deconditioning (10/2013)   Bipolar I disorder, most recent episode (or current) manic, unspecified    pt denies this   Bronchitis, chronic obstructive (HCC) 2008 & 2009   FeV1 64% TLC 105% DLCO 55% 2008  -  FeV1 81% FeF 25-75 43% 2009   COPD (chronic obstructive pulmonary disease) (HCC)    emphysema, not an issue per pulm (10/2013)   History of pyelonephritis 05/2012   HTN (hypertension)    Hx of migraines    Knee pain, right    Leukocytosis, unspecified    Lumbar disc disease with radiculopathy    multilevel spondylitic changes, scoliosis, and anterolisthesis L4 not thought to currently be surg candidate (Dr. Phoebe Perch, [facility]) (Dr.  Ollen Bowl, [place])   Nicotine addiction    Nonspecific abnormal electrocardiogram (ECG) (EKG)    Obesity    Pure hyperglyceridemia    RSV (respiratory syncytial virus pneumonia) 01/2020   hospitalization   Suicide attempt (HCC) 06/2001   Swelling of limb    Urge and stress incontinence 07/2012   (MacDiarmid)    Family History  Problem Relation Age of Onset   Heart disease Brother        MI in early 41's   Hypertension Mother    Hyperlipidemia Mother    Heart disease Mother    Hypertension Father    Asthma Father    Breast cancer Paternal Grandmother    Breast cancer Paternal Aunt     Past Surgical History:  Procedure Laterality Date  BUBBLE STUDY  04/02/2020   Procedure: BUBBLE STUDY;  Surgeon: Vesta Mixer, MD;  Location[facility] ENDOSCOPY;  Service: Cardiovascular;;   CHOLECYSTECTOMY  07/1982   CORONARY/GRAFT ACUTE MI REVASCULARIZATION N/A 11/02/2020   Procedure: Coronary/Graft Acute MI Revascularization;  Surgeon: Yvonne Kendall, MD;  Location: [facility] INVASIVE CV LAB;  Service: Cardiovascular;  Laterality: N/A;   CT MAXILLOFACIAL WO/W CM  06/14/06   Left max mucocele   CT of chest  06/14/2006   Normal except c/w active inflammation / infection.  Left renal atrophy   ERCP / sphincterotomy - stenosis  01/15/06   IR ANGIO INTRA EXTRACRAN SEL COM CAROTID INNOMINATE BILAT MOD SED  02/09/2020   IR ANGIO VERTEBRAL SEL SUBCLAVIAN INNOMINATE UNI L MOD SED  02/09/2020   IR ANGIO VERTEBRAL SEL VERTEBRAL UNI R MOD SED  02/09/2020   IR ANGIO VERTEBRAL SEL VERTEBRAL UNI R MOD SED  02/13/2020   IR US GUIDE VASC ACCESS RIGHT  02/09/2020   IR US GUIDE VASC ACCESS RIGHT  02/13/2020   LEFT HEART CATH AND CORONARY ANGIOGRAPHY N/A 11/02/2020   Procedure: LEFT HEART CATH AND CORONARY ANGIOGRAPHY;  Surgeon: Yvonne Kendall, MD;  Location: [facility] INVASIVE CV LAB;  Service: Cardiovascular;  Laterality: N/A;   LOOP RECORDER INSERTION N/A 04/02/2020   Procedure: LOOP RECORDER INSERTION;  Surgeon: Marinus Maw, MD;   Location: [facility] INVASIVE CV LAB;  Service: Cardiovascular;  Laterality: N/A;   lumbar MRI  11/2010   multilevel spondylitic changes with upper lumbar scoliosis and anterolisthesis of L4 on 5 with some L foraminal narrowing esp at L/3 with concavity of scoliosis, some central stenosis and biforaminal narrowing at L4/5 with spondylolisthesis   [place] - Pneumonia, acute asthma, left maxillary sinusitis  01/11 - 06/18/2006   RADIOLOGY WITH ANESTHESIA N/A 02/13/2020   Procedure: Carotid Stent;  Surgeon: Julieanne Cotton, MD;  Location: [facility] OR;  Service: Radiology;  Laterality: N/A;   Retained stone     ERCP secondary to retained stone   Right knee surgery  1984,1989,1989,1991,1994   Reconstructions for ACL insuff   TEE WITHOUT CARDIOVERSION N/A 04/02/2020   Procedure: TRANSESOPHAGEAL ECHOCARDIOGRAM (TEE);  Surgeon: Elease Hashimoto Deloris Ping, MD;  Location: [facility] ENDOSCOPY;  Service: Cardiovascular;  Laterality: N/A;   TUBAL LIGATION     Social History   Occupational History   Occupation: Administrator, arts: [employer]     Employer: [employer]   Tobacco Use   Smoking status: Every Day    Packs/day: 0.25    Years: 41.00    Pack years: 10.25    Types: Cigarettes   Smokeless tobacco: Never  Vaping Use   Vaping Use: Never used  Substance and Sexual Activity   Alcohol use: Yes    Alcohol/week: 0.0 standard drinks    Comment: occasional   Drug use: Not Currently    Types: Marijuana   Sexual activity: Not on file

## 2020-11-11 ENCOUNTER — Other Ambulatory Visit: Payer: Self-pay | Admitting: Family Medicine

## 2020-11-13 ENCOUNTER — Telehealth (HOSPITAL_COMMUNITY): Payer: Self-pay

## 2020-11-13 NOTE — Telephone Encounter (Signed)
Called and spoke with pt in regards to CR, pt stated she is not interested at this time.   Closed referral 

## 2020-11-13 NOTE — Progress Notes (Signed)
Carelink Summary Report / Loop Recorder 

## 2020-11-13 NOTE — Telephone Encounter (Signed)
Pt insurance is active and benefits verified through [employer]. Co-pay $0.00, DED $0.00/$0.00 met, out of pocket $7,550.00/$0.00 met, co-insurance $0.00. $0.00 pre-authorization required. Passport, 11/13/20 @ 2:25PM, REF#[id number]   2ndary insurance is active and benefits verified through Medicaid. Co-pay $0.00, DED $0.00/$0.00 met, out of pocket $0.00/$0.00 met, co-insurance 0%. No pre-authorization required. Passport, 11/13/20 @ 2:27PM, REF#[id number]   Will contact patient to see if she is interested in the Cardiac Rehab Program. If interested, patient will need to complete follow up appt. Once completed, patient will be contacted for scheduling upon review by the RN Navigator.  

## 2020-11-14 ENCOUNTER — Ambulatory Visit: Payer: Self-pay

## 2020-11-14 ENCOUNTER — Ambulatory Visit (INDEPENDENT_AMBULATORY_CARE_PROVIDER_SITE_OTHER): Payer: Medicare Other | Admitting: Physician Assistant

## 2020-11-14 ENCOUNTER — Encounter: Payer: Self-pay | Admitting: Orthopedic Surgery

## 2020-11-14 DIAGNOSIS — M25572 Pain in left ankle and joints of left foot: Secondary | ICD-10-CM

## 2020-11-14 MED ORDER — OXYCODONE-ACETAMINOPHEN 5-325 MG PO TABS: 1.0000 | ORAL_TABLET | ORAL | 0 refills | Status: DC | PRN

## 2020-11-14 NOTE — Progress Notes (Signed)
Office Visit Note   Patient: Brenda [name]           Date of Birth: [birth year]           MRN: [id number] Visit Date: 11/14/2020              Requested by: [name], MD [address] [place],  [place] [address] PCP: [name], MD  Chief Complaint  Patient presents with   Left Ankle - Follow-up    10/20/20 left ankle fx       HPI: Patient is a 60 year old woman who presents today 3 weeks status post left distal fibula fracture Weber B.  None operative treatment was recommended given her history of diabetes her history of smoking and radiographic findings which showed minimal displacement.  She has tried for the most part not to put weight on this.  Unfortunately shortly after her last visit she had a myocardial infarction and stents placed.  She is still smoking an occasional cigarette  Assessment & Plan: Visit Diagnoses:  1. Pain in left ankle and joints of left foot     Plan: Patient may work on gentle range of motion of her ankle but for the most part should remain minimal to nonweightbearing in 2 weeks for follow-up for new x-rays of her ankle.  I will refill her pain medication  Follow-Up Instructions: No follow-ups on file.   Ortho Exam  Patient is alert, oriented, no adenopathy, well-dressed, normal affect, normal respiratory effort. Left ankle mild soft tissue swelling no cellulitis no clinical displacement.  She is still somewhat tender over the lateral malleolus.  Palpable dorsalis pedis pulses.  Not tender medially.  Compartments are soft and compressible.  Imaging: XR Ankle Complete Left  Result Date: 11/14/2020 Radiographs of her ankle demonstrate Weber B distal fibula fracture still reduced in acceptable position overall well-maintained mortise  No images are attached to the encounter.  Labs: Lab Results  Component Value Date   HGBA1C 5.8 (H) 11/02/2020   HGBA1C 5.8 09/24/2020   HGBA1C 6.1 (H) 02/10/2020   ESRSEDRATE 31 (H) 02/09/2020    ESRSEDRATE 39 (H) 02/01/2020   ESRSEDRATE 23 (H) 01/31/2020   CRP 1.7 (H) 02/01/2020   REPTSTATUS 01/31/2020 FINAL 01/31/2020   CULT  05/30/2013    NO GROWTH 5 DAYS Performed at [facility]   LABORGA  01/12/2017    Three or more organisms present,each greater than 10,000 CFU/mL.These organisms,commonly found on external and internal genitalia,are considered to be colonizers.No further testing performed.      Lab Results  Component Value Date   ALBUMIN 3.6 11/02/2020   ALBUMIN 3.6 09/24/2020   ALBUMIN 3.5 02/07/2020    Lab Results  Component Value Date   MG 2.5 (H) 07/26/2015   MG 2.6 (H) 07/25/2015   MG 2.5 (H) 07/24/2015   Lab Results  Component Value Date   VD25OH 26.05 (L) 09/24/2020    No results found for: PREALBUMIN CBC EXTENDED Latest Ref Rng & Units 11/03/2020 11/02/2020 11/02/2020  WBC 4.0 - 10.5 K/uL 14.1(H) - 18.5(H)  RBC 3.87 - 5.11 MIL/uL 4.51 - 4.76  HGB 12.0 - 15.0 g/dL 66.5 99.3 57.0  HCT 17.7 - 46.0 % 41.6 39.0 44.1  PLT 150 - 400 K/uL 312 - 369  NEUTROABS 1.4 - 7.7 K/uL - - -  LYMPHSABS 0.7 - 4.0 K/uL - - -     There is no height or weight on file to calculate BMI.  Orders:  Orders Placed This Encounter  Procedures   XR Ankle Complete Left   No orders of the defined types were placed in this encounter.    Procedures: No procedures performed  Clinical Data: No additional findings.  ROS:  All other systems negative, except as noted in the HPI. Review of Systems  Objective: Vital Signs: LMP 03/08/2013   Specialty Comments:  No specialty comments available.  PMFS History: Patient Active Problem List   Diagnosis Date Noted   STEMI (ST elevation myocardial infarction) (HCC) 11/02/2020   STEMI involving right coronary artery (HCC) 11/02/2020   Medicare annual wellness visit, initial 10/01/2020   Cannabis dependence (HCC) 04/30/2020   Basilar artery stenosis 02/18/2020   TIA (transient ischemic attack) 02/08/2020    Chronic combined systolic and diastolic CHF (congestive heart failure) (HCC) 02/08/2020   Elevated troponin 02/04/2020   Chronic kidney disease, stage 3a (HCC) 08/07/2019   Knee mass, right 11/28/2018   Advanced care planning/counseling discussion 07/27/2018   Unilateral primary osteoarthritis, left knee 10/28/2017   COPD  GOLD II/III still smoking/ 02 dep  10/20/2017   Cor pulmonale (chronic) (HCC) 09/16/2017   Diastolic dysfunction 08/26/2017   Grief 02/24/2017   Chronic respiratory failure with hypoxia (HCC) 07/31/2015   Type 2 diabetes, controlled, with peripheral neuropathy (HCC)    COPD exacerbation (HCC) 09/14/2014   OSA on CPAP 09/14/2014   Health maintenance examination 09/14/2014   Upper airway cough syndrome 09/22/2013   Chronic pain syndrome 05/30/2013   Migraine, unspecified, without mention of intractable migraine without mention of status migrainosus 05/30/2013   Pyelonephritis 05/05/2012   Asthmatic bronchitis 02/27/2008   Leukocytosis 02/22/2008   NONSPECIFIC ABNORMAL ELECTROCARDIOGRAM 02/22/2008   HYPERTENSION, BENIGN ESSENTIAL 06/23/2007   HYPERTRIGLYCERIDEMIA 06/01/2007   Morbid obesity with BMI of 40.0-44.9, adult (HCC) 06/01/2007   Mood disorder (HCC) 06/01/2007   Cigarette smoker 06/01/2007   Left leg swelling 05/04/2007   Past Medical History:  Diagnosis Date   Acute pancreatitis    Asthma    main dyspnea thought due to deconditioning (10/2013)   Bipolar I disorder, most recent episode (or current) manic, unspecified    pt denies this   Bronchitis, chronic obstructive (HCC) 2008 & 2009   FeV1 64% TLC 105% DLCO 55% 2008  -  FeV1 81% FeF 25-75 43% 2009   COPD (chronic obstructive pulmonary disease) (HCC)    emphysema, not an issue per pulm (10/2013)   History of pyelonephritis 05/2012   HTN (hypertension)    Hx of migraines    Knee pain, right    Leukocytosis, unspecified    Lumbar disc disease with radiculopathy    multilevel spondylitic changes,  scoliosis, and anterolisthesis L4 not thought to currently be surg candidate (Dr. Phoebe Perch, [facility]) (Dr. Ollen Bowl, [place])   Nicotine addiction    Nonspecific abnormal electrocardiogram (ECG) (EKG)    Obesity    Pure hyperglyceridemia    RSV (respiratory syncytial virus pneumonia) 01/2020   hospitalization   Suicide attempt (HCC) 06/2001   Swelling of limb    Urge and stress incontinence 07/2012   (MacDiarmid)    Family History  Problem Relation Age of Onset   Heart disease Brother        MI in early 77's   Hypertension Mother    Hyperlipidemia Mother    Heart disease Mother    Hypertension Father    Asthma Father    Breast cancer Paternal Grandmother    Breast cancer Paternal Aunt     Past  Surgical History:  Procedure Laterality Date   BUBBLE STUDY  04/02/2020   Procedure: BUBBLE STUDY;  Surgeon: Vesta Mixer, MD;  Location: [facility] ENDOSCOPY;  Service: Cardiovascular;;   CHOLECYSTECTOMY  07/1982   CORONARY/GRAFT ACUTE MI REVASCULARIZATION N/A 11/02/2020   Procedure: Coronary/Graft Acute MI Revascularization;  Surgeon: Yvonne Kendall, MD;  [facility]: [facility] INVASIVE CV LAB;  Service: Cardiovascular;  Laterality: N/A;   CT MAXILLOFACIAL WO/W CM  06/14/06   Left max mucocele   CT of chest  06/14/2006   Normal except c/w active inflammation / infection.  Left renal atrophy   ERCP / sphincterotomy - stenosis  01/15/06   IR ANGIO INTRA EXTRACRAN SEL COM CAROTID INNOMINATE BILAT MOD SED  02/09/2020   IR ANGIO VERTEBRAL SEL SUBCLAVIAN INNOMINATE UNI L MOD SED  02/09/2020   IR ANGIO VERTEBRAL SEL VERTEBRAL UNI R MOD SED  02/09/2020   IR ANGIO VERTEBRAL SEL VERTEBRAL UNI R MOD SED  02/13/2020   IR US GUIDE VASC ACCESS RIGHT  02/09/2020   IR US GUIDE VASC ACCESS RIGHT  02/13/2020   LEFT HEART CATH AND CORONARY ANGIOGRAPHY N/A 11/02/2020   Procedure: LEFT HEART CATH AND CORONARY ANGIOGRAPHY;  Surgeon: Yvonne Kendall, MD;  [facility]: [facility] INVASIVE CV LAB;  Service: Cardiovascular;  Laterality: N/A;   LOOP  RECORDER INSERTION N/A 04/02/2020   Procedure: LOOP RECORDER INSERTION;  Surgeon: Marinus Maw, MD;  Location: [facility] INVASIVE CV LAB;  Service: Cardiovascular;  Laterality: N/A;   lumbar MRI  11/2010   multilevel spondylitic changes with upper lumbar scoliosis and anterolisthesis of L4 on 5 with some L foraminal narrowing esp at L/3 with concavity of scoliosis, some central stenosis and biforaminal narrowing at L4/5 with spondylolisthesis   [place] - Pneumonia, acute asthma, left maxillary sinusitis  01/11 - 06/18/2006   RADIOLOGY WITH ANESTHESIA N/A 02/13/2020   Procedure: Carotid Stent;  Surgeon: Julieanne Cotton, MD;  Location: [facility] OR;  Service: Radiology;  Laterality: N/A;   Retained stone     ERCP secondary to retained stone   Right knee surgery  1984,1989,1989,1991,1994   Reconstructions for ACL insuff   TEE WITHOUT CARDIOVERSION N/A 04/02/2020   Procedure: TRANSESOPHAGEAL ECHOCARDIOGRAM (TEE);  Surgeon: Elease Hashimoto Deloris Ping, MD;  Location: [facility] ENDOSCOPY;  Service: Cardiovascular;  Laterality: N/A;   TUBAL LIGATION     Social History   Occupational History   Occupation: Administrator, arts: [employer]     Employer: [employer]   Tobacco Use   Smoking status: Every Day    Packs/day: 0.25    Years: 41.00    Pack years: 10.25    Types: Cigarettes   Smokeless tobacco: Never  Vaping Use   Vaping Use: Never used  Substance and Sexual Activity   Alcohol use: Yes    Alcohol/week: 0.0 standard drinks    Comment: occasional   Drug use: Not Currently    Types: Marijuana   Sexual activity: Not on file

## 2020-11-16 ENCOUNTER — Other Ambulatory Visit: Payer: Self-pay | Admitting: Family Medicine

## 2020-11-18 NOTE — Telephone Encounter (Signed)
Too early  Name of Medication:  Lorazepam Name of Pharmacy: [facility] [address] Last Fill or Written Date and Quantity: 11/01/20, #30 Last Office Visit and Type: 10/01/20, AWV Next Office Visit and Type: 04/04/21, 6 mo f/u Last Controlled Substance Agreement Date: none Last UDS: none

## 2020-11-20 ENCOUNTER — Ambulatory Visit (INDEPENDENT_AMBULATORY_CARE_PROVIDER_SITE_OTHER): Payer: Medicare Other

## 2020-11-20 DIAGNOSIS — G459 Transient cerebral ischemic attack, unspecified: Secondary | ICD-10-CM

## 2020-11-20 LAB — CUP PACEART REMOTE DEVICE CHECK
Date Time Interrogation Session: 20220621170033
Implantable Pulse Generator Implant Date: 20211102

## 2020-11-21 NOTE — Telephone Encounter (Signed)
ERx 

## 2020-11-28 ENCOUNTER — Ambulatory Visit: Payer: Medicare Other | Admitting: Orthopedic Surgery

## 2020-12-03 ENCOUNTER — Ambulatory Visit: Payer: Self-pay

## 2020-12-03 ENCOUNTER — Ambulatory Visit (INDEPENDENT_AMBULATORY_CARE_PROVIDER_SITE_OTHER): Payer: Medicare Other | Admitting: Orthopedic Surgery

## 2020-12-03 ENCOUNTER — Encounter: Payer: Self-pay | Admitting: Orthopedic Surgery

## 2020-12-03 DIAGNOSIS — S8265XD Nondisplaced fracture of lateral malleolus of left fibula, subsequent encounter for closed fracture with routine healing: Secondary | ICD-10-CM

## 2020-12-03 DIAGNOSIS — M25572 Pain in left ankle and joints of left foot: Secondary | ICD-10-CM

## 2020-12-03 MED ORDER — OXYCODONE-ACETAMINOPHEN 5-325 MG PO TABS: 1.0000 | ORAL_TABLET | ORAL | 0 refills | Status: DC | PRN

## 2020-12-03 NOTE — Progress Notes (Signed)
Office Visit Note   Patient: Brenda [name]           Date of Birth: 01-23-1961           MRN: [id number] Visit Date: 12/03/2020              Requested by: [name], MD [address] [place],  [place] [address] PCP: [name], MD  Chief Complaint  Patient presents with   Left Ankle - Follow-up    10/20/20 left ankle fracture.       HPI: Patient is a 60 year old woman who presents about 6 weeks status post nondisplaced Weber B closed left distal fibular fracture.  She states most the pain is at night.  She has been ambulating in a fracture boot.  Past medical history positive for diabetes and smoking.  Assessment & Plan: Visit Diagnoses:  1. Pain in left ankle and joints of left foot   2. Closed nondisplaced fracture of lateral malleolus of left fibula with routine healing, subsequent encounter     Plan: We will advance her to an ASO at this time weightbearing as tolerated if there is any clinical changes she will call us otherwise follow-up in 4 weeks with repeat three-view radiographs of the left ankle.  Follow-Up Instructions: Return in about 4 weeks (around 12/31/2020).   Ortho Exam  Patient is alert, oriented, no adenopathy, well-dressed, normal affect, normal respiratory effort. Examination patient has a good dorsalis pedis pulse her ankle is plantigrade.  The deltoid is nontender to palpation she has little bit of tenderness to palpation over the lateral ankle ligaments and minimal tenderness to palpation over the fibula.  The ankle has good range of motion.  Imaging: XR Ankle Complete Left  Result Date: 12/03/2020 Three-view radiographs of the right ankle shows a congruent mortise with a well-healed Weber B fibular fracture with essentially no shortening.  Patient does have osteophytic bone spurs from traumatic arthritis.  No images are attached to the encounter.  Labs: Lab Results  Component Value Date   HGBA1C 5.8 (H) 11/02/2020   HGBA1C 5.8  09/24/2020   HGBA1C 6.1 (H) 02/10/2020   ESRSEDRATE 31 (H) 02/09/2020   ESRSEDRATE 39 (H) 02/01/2020   ESRSEDRATE 23 (H) 01/31/2020   CRP 1.7 (H) 02/01/2020   REPTSTATUS 01/31/2020 FINAL 01/31/2020   CULT  05/30/2013    NO GROWTH 5 DAYS Performed at [facility]   LABORGA  01/12/2017    Three or more organisms present,each greater than 10,000 CFU/mL.These organisms,commonly found on external and internal genitalia,are considered to be colonizers.No further testing performed.      Lab Results  Component Value Date   ALBUMIN 3.6 11/02/2020   ALBUMIN 3.6 09/24/2020   ALBUMIN 3.5 02/07/2020    Lab Results  Component Value Date   MG 2.5 (H) 07/26/2015   MG 2.6 (H) 07/25/2015   MG 2.5 (H) 07/24/2015   Lab Results  Component Value Date   VD25OH 26.05 (L) 09/24/2020    No results found for: PREALBUMIN CBC EXTENDED Latest Ref Rng & Units 11/03/2020 11/02/2020 11/02/2020  WBC 4.0 - 10.5 K/uL 14.1(H) - 18.5(H)  RBC 3.87 - 5.11 MIL/uL 4.51 - 4.76  HGB 12.0 - 15.0 g/dL 19.1 47.8 29.5  HCT 62.1 - 46.0 % 41.6 39.0 44.1  PLT 150 - 400 K/uL 312 - 369  NEUTROABS 1.4 - 7.7 K/uL - - -  LYMPHSABS 0.7 - 4.0 K/uL - - -     There  is no height or weight on file to calculate BMI.  Orders:  Orders Placed This Encounter  Procedures   XR Ankle Complete Left   No orders of the defined types were placed in this encounter.    Procedures: No procedures performed  Clinical Data: No additional findings.  ROS:  All other systems negative, except as noted in the HPI. Review of Systems  Objective: Vital Signs: LMP 03/08/2013   Specialty Comments:  No specialty comments available.  PMFS History: Patient Active Problem List   Diagnosis Date Noted   STEMI (ST elevation myocardial infarction) (HCC) 11/02/2020   STEMI involving right coronary artery (HCC) 11/02/2020   Medicare annual wellness visit, initial 10/01/2020   Cannabis dependence (HCC) 04/30/2020   Basilar artery  stenosis 02/18/2020   TIA (transient ischemic attack) 02/08/2020   Chronic combined systolic and diastolic CHF (congestive heart failure) (HCC) 02/08/2020   Elevated troponin 02/04/2020   Chronic kidney disease, stage 3a (HCC) 08/07/2019   Knee mass, right 11/28/2018   Advanced care planning/counseling discussion 07/27/2018   Unilateral primary osteoarthritis, left knee 10/28/2017   COPD  GOLD II/III still smoking/ 02 dep  10/20/2017   Cor pulmonale (chronic) (HCC) 09/16/2017   Diastolic dysfunction 08/26/2017   Grief 02/24/2017   Chronic respiratory failure with hypoxia (HCC) 07/31/2015   Type 2 diabetes, controlled, with peripheral neuropathy (HCC)    COPD exacerbation (HCC) 09/14/2014   OSA on CPAP 09/14/2014   Health maintenance examination 09/14/2014   Upper airway cough syndrome 09/22/2013   Chronic pain syndrome 05/30/2013   Migraine, unspecified, without mention of intractable migraine without mention of status migrainosus 05/30/2013   Pyelonephritis 05/05/2012   Asthmatic bronchitis 02/27/2008   Leukocytosis 02/22/2008   NONSPECIFIC ABNORMAL ELECTROCARDIOGRAM 02/22/2008   HYPERTENSION, BENIGN ESSENTIAL 06/23/2007   HYPERTRIGLYCERIDEMIA 06/01/2007   Morbid obesity with BMI of 40.0-44.9, adult (HCC) 06/01/2007   Mood disorder (HCC) 06/01/2007   Cigarette smoker 06/01/2007   Left leg swelling 05/04/2007   Past Medical History:  Diagnosis Date   Acute pancreatitis    Asthma    main dyspnea thought due to deconditioning (10/2013)   Bipolar I disorder, most recent episode (or current) manic, unspecified    pt denies this   Bronchitis, chronic obstructive (HCC) 2008 & 2009   FeV1 64% TLC 105% DLCO 55% 2008  -  FeV1 81% FeF 25-75 43% 2009   COPD (chronic obstructive pulmonary disease) (HCC)    emphysema, not an issue per pulm (10/2013)   History of pyelonephritis 05/2012   HTN (hypertension)    Hx of migraines    Knee pain, right    Leukocytosis, unspecified    Lumbar  disc disease with radiculopathy    multilevel spondylitic changes, scoliosis, and anterolisthesis L4 not thought to currently be surg candidate (Dr. Phoebe Perch, [facility]) (Dr. Ollen Bowl, [place])   Nicotine addiction    Nonspecific abnormal electrocardiogram (ECG) (EKG)    Obesity    Pure hyperglyceridemia    RSV (respiratory syncytial virus pneumonia) 01/2020   hospitalization   Suicide attempt (HCC) 06/2001   Swelling of limb    Urge and stress incontinence 07/2012   (MacDiarmid)    Family History  Problem Relation Age of Onset   Heart disease Brother        MI in early 50's   Hypertension Mother    Hyperlipidemia Mother    Heart disease Mother    Hypertension Father    Asthma Father    Breast cancer Paternal  Grandmother    Breast cancer Paternal Aunt     Past Surgical History:  Procedure Laterality Date   BUBBLE STUDY  04/02/2020   Procedure: BUBBLE STUDY;  Surgeon: Vesta Mixer, MD;  Location: [facility] ENDOSCOPY;  Service: Cardiovascular;;   CHOLECYSTECTOMY  07/1982   CORONARY/GRAFT ACUTE MI REVASCULARIZATION N/A 11/02/2020   Procedure: Coronary/Graft Acute MI Revascularization;  Surgeon: Yvonne Kendall, MD;  Location: [facility] INVASIVE CV LAB;  Service: Cardiovascular;  Laterality: N/A;   CT MAXILLOFACIAL WO/W CM  06/14/06   Left max mucocele   CT of chest  06/14/2006   Normal except c/w active inflammation / infection.  Left renal atrophy   ERCP / sphincterotomy - stenosis  01/15/06   IR ANGIO INTRA EXTRACRAN SEL COM CAROTID INNOMINATE BILAT MOD SED  02/09/2020   IR ANGIO VERTEBRAL SEL SUBCLAVIAN INNOMINATE UNI L MOD SED  02/09/2020   IR ANGIO VERTEBRAL SEL VERTEBRAL UNI R MOD SED  02/09/2020   IR ANGIO VERTEBRAL SEL VERTEBRAL UNI R MOD SED  02/13/2020   IR US GUIDE VASC ACCESS RIGHT  02/09/2020   IR US GUIDE VASC ACCESS RIGHT  02/13/2020   LEFT HEART CATH AND CORONARY ANGIOGRAPHY N/A 11/02/2020   Procedure: LEFT HEART CATH AND CORONARY ANGIOGRAPHY;  Surgeon: Yvonne Kendall, MD;  Location: [facility]  INVASIVE CV LAB;  Service: Cardiovascular;  Laterality: N/A;   LOOP RECORDER INSERTION N/A 04/02/2020   Procedure: LOOP RECORDER INSERTION;  Surgeon: Marinus Maw, MD;  Location: [facility] INVASIVE CV LAB;  Service: Cardiovascular;  Laterality: N/A;   lumbar MRI  11/2010   multilevel spondylitic changes with upper lumbar scoliosis and anterolisthesis of L4 on 5 with some L foraminal narrowing esp at L/3 with concavity of scoliosis, some central stenosis and biforaminal narrowing at L4/5 with spondylolisthesis   [place] - Pneumonia, acute asthma, left maxillary sinusitis  01/11 - 06/18/2006   RADIOLOGY WITH ANESTHESIA N/A 02/13/2020   Procedure: Carotid Stent;  Surgeon: Julieanne Cotton, MD;  Location: [facility] OR;  Service: Radiology;  Laterality: N/A;   Retained stone     ERCP secondary to retained stone   Right knee surgery  1984,1989,1989,1991,1994   Reconstructions for ACL insuff   TEE WITHOUT CARDIOVERSION N/A 04/02/2020   Procedure: TRANSESOPHAGEAL ECHOCARDIOGRAM (TEE);  Surgeon: Elease Hashimoto Deloris Ping, MD;  Location: [facility] ENDOSCOPY;  Service: Cardiovascular;  Laterality: N/A;   TUBAL LIGATION     Social History   Occupational History   Occupation: Administrator, arts: [employer]     Employer: [employer]   Tobacco Use   Smoking status: Every Day    Packs/day: 0.25    Years: 41.00    Pack years: 10.25    Types: Cigarettes   Smokeless tobacco: Never  Vaping Use   Vaping Use: Never used  Substance and Sexual Activity   Alcohol use: Yes    Alcohol/week: 0.0 standard drinks    Comment: occasional   Drug use: Not Currently    Types: Marijuana   Sexual activity: Not on file

## 2020-12-04 ENCOUNTER — Other Ambulatory Visit: Payer: Self-pay | Admitting: Family Medicine

## 2020-12-04 NOTE — Progress Notes (Deleted)
Cardiology Office Note:    Date:  12/04/2020  NAME:  Brenda [name]    MRN: [id number] DOB:  25-Jan-1961   PCP:  [name], MD  Cardiologist:  Reatha Harps, MD  Electrophysiologist:  None   Referring MD: [name], MD   No chief complaint on file.   History of Present Illness:    Brenda [name] is a 60 y.o. female with a hx of CAD (STEMI), COPD, HTN, HLD, DM who presents for follow-up. Admitted for STEMI 11/02/2020 and is s/p PCI.    Problem List 1. NSTEMI 01/30/2020 -stress induced CM 2. Takotsubo CM -EF 30-35% with apical akinesis 01/31/2020 in setting of COPD/hypercarbic respiratory failure   -EF 40-45% with improvement in WMA 02/08/2020 -EF 55-60% 06/28/2020 3. STEMI  -11/02/2020 -> PCI to proximal RCA -minimal LAD/LCX 4. COPD -severe COPD -2L 5. Tobacco abuse 6. HTN 7. DM -A1c 6.1 8. HLD -T chol 118, HDL 42, LDL 49, TG 135 9. CVA 01/2020/Cryptogenic -basilar artery stenosis? -TEE negative -loop recorder placed 10. SVT  Past Medical History: Past Medical History:  Diagnosis Date   Acute pancreatitis    Asthma    main dyspnea thought due to deconditioning (10/2013)   Bipolar I disorder, most recent episode (or current) manic, unspecified    pt denies this   Bronchitis, chronic obstructive (HCC) 2008 & 2009   FeV1 64% TLC 105% DLCO 55% 2008  -  FeV1 81% FeF 25-75 43% 2009   COPD (chronic obstructive pulmonary disease) (HCC)    emphysema, not an issue per pulm (10/2013)   History of pyelonephritis 05/2012   HTN (hypertension)    Hx of migraines    Knee pain, right    Leukocytosis, unspecified    Lumbar disc disease with radiculopathy    multilevel spondylitic changes, scoliosis, and anterolisthesis L4 not thought to currently be surg candidate (Dr. Phoebe Perch, [facility]) (Dr. Ollen Bowl, [place])   Nicotine addiction    Nonspecific abnormal electrocardiogram (ECG) (EKG)    Obesity    Pure hyperglyceridemia    RSV (respiratory syncytial virus pneumonia)  01/2020   hospitalization   Suicide attempt (HCC) 06/2001   Swelling of limb    Urge and stress incontinence 07/2012   (MacDiarmid)    Past Surgical History: Past Surgical History:  Procedure Laterality Date   BUBBLE STUDY  04/02/2020   Procedure: BUBBLE STUDY;  Surgeon: Vesta Mixer, MD;  Location: [facility] ENDOSCOPY;  Service: Cardiovascular;;   CHOLECYSTECTOMY  07/1982   CORONARY/GRAFT ACUTE MI REVASCULARIZATION N/A 11/02/2020   Procedure: Coronary/Graft Acute MI Revascularization;  Surgeon: Yvonne Kendall, MD;  [facility] INVASIVE CV LAB;  Service: Cardiovascular;  Laterality: N/A;   CT MAXILLOFACIAL WO/W CM  06/14/06   Left max mucocele   CT of chest  06/14/2006   Normal except c/w active inflammation / infection.  Left renal atrophy   ERCP / sphincterotomy - stenosis  01/15/06   IR ANGIO INTRA EXTRACRAN SEL COM CAROTID INNOMINATE BILAT MOD SED  02/09/2020   IR ANGIO VERTEBRAL SEL SUBCLAVIAN INNOMINATE UNI L MOD SED  02/09/2020   IR ANGIO VERTEBRAL SEL VERTEBRAL UNI R MOD SED  02/09/2020   IR ANGIO VERTEBRAL SEL VERTEBRAL UNI R MOD SED  02/13/2020   IR US GUIDE VASC ACCESS RIGHT  02/09/2020   IR US GUIDE VASC ACCESS RIGHT  02/13/2020   LEFT HEART CATH AND CORONARY ANGIOGRAPHY N/A 11/02/2020   Procedure: LEFT HEART CATH AND CORONARY ANGIOGRAPHY;  Surgeon: End,  Cristal Deer, MD;  Location: [facility] INVASIVE CV LAB;  Service: Cardiovascular;  Laterality: N/A;   LOOP RECORDER INSERTION N/A 04/02/2020   Procedure: LOOP RECORDER INSERTION;  Surgeon: Marinus Maw, MD;  Location: [facility] INVASIVE CV LAB;  Service: Cardiovascular;  Laterality: N/A;   lumbar MRI  11/2010   multilevel spondylitic changes with upper lumbar scoliosis and anterolisthesis of L4 on 5 with some L foraminal narrowing esp at L/3 with concavity of scoliosis, some central stenosis and biforaminal narrowing at L4/5 with spondylolisthesis   [place] - Pneumonia, acute asthma, left maxillary sinusitis  01/11 - 06/18/2006   RADIOLOGY WITH  ANESTHESIA N/A 02/13/2020   Procedure: Carotid Stent;  Surgeon: Julieanne Cotton, MD;  Location: [facility] OR;  Service: Radiology;  Laterality: N/A;   Retained stone     ERCP secondary to retained stone   Right knee surgery  1984,1989,1989,1991,1994   Reconstructions for ACL insuff   TEE WITHOUT CARDIOVERSION N/A 04/02/2020   Procedure: TRANSESOPHAGEAL ECHOCARDIOGRAM (TEE);  Surgeon: Elease Hashimoto Deloris Ping, MD;  Location: [facility] ENDOSCOPY;  Service: Cardiovascular;  Laterality: N/A;   TUBAL LIGATION      Current Medications: No outpatient medications have been marked as taking for the 12/05/20 encounter (Appointment) with O'Neal, Ronnald Ramp, MD.     Allergies:    Metolazone   Social History: Social History   Socioeconomic History   Marital status: Widowed    Spouse name: Not on file   Number of children: 3   Years of education: Not on file   Highest education level: Not on file  Occupational History   Occupation: Geographical information systems officer    Employer: [employer]     Employer: [employer]   Tobacco Use   Smoking status: Every Day    Packs/day: 0.25    Years: 41.00    Pack years: 10.25    Types: Cigarettes   Smokeless tobacco: Never  Vaping Use   Vaping Use: Never used  Substance and Sexual Activity   Alcohol use: Yes    Alcohol/week: 0.0 standard drinks    Comment: occasional   Drug use: Not Currently    Types: Marijuana   Sexual activity: Not on file  Other Topics Concern   Not on file  Social History Narrative   Lives with husband and son and daughter in Social [place] and grandchild   Occupation: Media planner at [employer]   Activity: limited by dyspnea   Diet: good water, fruits/vegetables daily   Social Determinants of Health   Financial Resource Strain: Not on file  Food Insecurity: Not on file  Transportation Needs: Not on file  Physical Activity: Not on file  Stress: Not on file  Social Connections: Not on file     Family History: The  patient's ***family history includes Asthma in her father; Breast cancer in her paternal aunt and paternal grandmother; Heart disease in her brother and mother; Hyperlipidemia in her mother; Hypertension in her father and mother.  ROS:   All other ROS reviewed and negative. Pertinent positives noted in the HPI.    EKGs/Labs/Other Studies Reviewed:   The following studies were personally reviewed by me today:  EKG:  EKG is *** ordered today.  The ekg ordered today demonstrates ***, and was personally reviewed by me.  TTE 11/03/2020  1. Left ventricular ejection fraction, by estimation, is 45 to 50%. The  left ventricle has mildly decreased function. The left ventricle  demonstrates regional wall motion abnormalities (see scoring  diagram/findings for  description). Left ventricular  diastolic parameters are consistent with Grade II diastolic dysfunction  (pseudonormalization). Elevated left atrial pressure.   2. Right ventricular systolic function is normal. The right ventricular  size is normal.   3. The mitral valve is normal in structure. No evidence of mitral valve  regurgitation. No evidence of mitral stenosis.   4. The aortic valve is normal in structure. Aortic valve regurgitation is  not visualized. No aortic stenosis is present.   5. The inferior vena cava is dilated in size with >50% respiratory  variability, suggesting right atrial pressure of 8 mmHg.    Recent Labs: 01/30/2020: B Natriuretic Peptide 47.7 02/08/2020: TSH 0.821 11/02/2020: ALT 15 11/03/2020: BUN 13; Creatinine, Ser 0.92; Hemoglobin 13.3; Platelets 312; Potassium 3.8; Sodium 139   Recent Lipid Panel    Component Value Date/Time   CHOL 118 11/02/2020 1423   TRIG 135 11/02/2020 1423   HDL 42 11/02/2020 1423   CHOLHDL 2.8 11/02/2020 1423   VLDL 27 11/02/2020 1423   LDLCALC 49 11/02/2020 1423    Physical Exam:   VS:  LMP 03/08/2013    Wt Readings from Last 3 Encounters:  10/20/20 225 lb (102.1 kg)  10/08/20  235 lb (106.6 kg)  10/01/20 235 lb 8 oz (106.8 kg)    General: Well nourished, well developed, in no acute distress Head: Atraumatic, normal size  Eyes: PEERLA, EOMI  Neck: Supple, no JVD Endocrine: No thryomegaly Cardiac: Normal S1, S2; RRR; no murmurs, rubs, or gallops Lungs: Clear to auscultation bilaterally, no wheezing, rhonchi or rales  Abd: Soft, nontender, no hepatomegaly  Ext: No edema, pulses 2+ Musculoskeletal: No deformities, BUE and BLE strength normal and equal Skin: Warm and dry, no rashes   Neuro: Alert and oriented to person, place, time, and situation, CNII-XII grossly intact, no focal deficits  Psych: Normal mood and affect   ASSESSMENT:   MEESHA SEK is a 60 y.o. female who presents for the following: No diagnosis found.  PLAN:   There are no diagnoses linked to this encounter.  {Are you ordering a CV Procedure (e.g. stress test, cath, DCCV, TEE, etc)?   Press F2        :[id number]}  Disposition: No follow-ups on file.  Medication Adjustments/Labs and Tests Ordered: Current medicines are reviewed at length with the patient today.  Concerns regarding medicines are outlined above.  No orders of the defined types were placed in this encounter.  No orders of the defined types were placed in this encounter.   There are no Patient Instructions on file for this visit.   Time Spent with Patient: I have spent a total of *** minutes with patient reviewing hospital notes, telemetry, EKGs, labs and examining the patient as well as establishing an assessment and plan that was discussed with the patient.  > 50% of time was spent in direct patient care.  Signed, Lenna Gilford. Flora Lipps, MD, [facility]  [facility]  [address] [place], [place] [address] [phone]  12/04/2020 7:28 AM

## 2020-12-05 ENCOUNTER — Ambulatory Visit (HOSPITAL_BASED_OUTPATIENT_CLINIC_OR_DEPARTMENT_OTHER): Payer: Medicare Other | Admitting: Cardiovascular Disease

## 2020-12-05 DIAGNOSIS — G4733 Obstructive sleep apnea (adult) (pediatric): Secondary | ICD-10-CM | POA: Diagnosis not present

## 2020-12-08 ENCOUNTER — Other Ambulatory Visit: Payer: Self-pay | Admitting: Family Medicine

## 2020-12-10 NOTE — Progress Notes (Signed)
Carelink Summary Report / Loop Recorder 

## 2020-12-23 ENCOUNTER — Ambulatory Visit (INDEPENDENT_AMBULATORY_CARE_PROVIDER_SITE_OTHER): Payer: Medicare Other

## 2020-12-23 DIAGNOSIS — G459 Transient cerebral ischemic attack, unspecified: Secondary | ICD-10-CM

## 2020-12-24 LAB — CUP PACEART REMOTE DEVICE CHECK
Date Time Interrogation Session: 20220724170256
Implantable Pulse Generator Implant Date: 20211102

## 2020-12-26 ENCOUNTER — Telehealth: Payer: Self-pay | Admitting: Family Medicine

## 2020-12-26 NOTE — Chronic Care Management (AMB) (Signed)
  Chronic Care Management   Outreach Note  12/26/2020 Name: Brenda [name] MRN: [id number] DOB: 1961-02-05  Referred by: [name], MD Reason for referral : No chief complaint on file.   An unsuccessful telephone outreach was attempted today. The patient was referred to the pharmacist for assistance with care management and care coordination.   Follow Up Plan:   Tatjana Dellinger Upstream Scheduler

## 2020-12-30 ENCOUNTER — Telehealth: Payer: Self-pay | Admitting: Family Medicine

## 2020-12-30 NOTE — Chronic Care Management (AMB) (Signed)
  Chronic Care Management   Note  12/30/2020 Name: Brenda [name] MRN: [id number] DOB: [birth year]  [name] is a 60 y.o. year old female who is a primary care patient of [name], MD. I reached out to [name] by phone today in response to a referral sent by [name] PCP, [name], MD.   [name] was given information about Chronic Care Management services today including:  CCM service includes personalized support from designated clinical staff supervised by her physician, including individualized plan of care and coordination with other care providers 24/7 contact phone numbers for assistance for urgent and routine care needs. Service will only be billed when office clinical staff spend 20 minutes or more in a month to coordinate care. Only one practitioner may furnish and bill the service in a calendar month. The patient may stop CCM services at any time (effective at the end of the month) by phone call to the office staff.   Patient agreed to services and verbal consent obtained.   Follow up plan:   Tatjana Restaurant manager, fast food

## 2020-12-31 ENCOUNTER — Ambulatory Visit (INDEPENDENT_AMBULATORY_CARE_PROVIDER_SITE_OTHER): Payer: Medicare Other | Admitting: Orthopedic Surgery

## 2020-12-31 ENCOUNTER — Encounter: Payer: Self-pay | Admitting: Orthopedic Surgery

## 2020-12-31 ENCOUNTER — Ambulatory Visit: Payer: Self-pay

## 2020-12-31 DIAGNOSIS — S82892A Other fracture of left lower leg, initial encounter for closed fracture: Secondary | ICD-10-CM

## 2020-12-31 DIAGNOSIS — S82892D Other fracture of left lower leg, subsequent encounter for closed fracture with routine healing: Secondary | ICD-10-CM | POA: Diagnosis not present

## 2020-12-31 NOTE — Progress Notes (Signed)
Office Visit Note   Patient: Brenda [name]           Date of Birth: [birth year]           MRN: [id number] Visit Date: 12/31/2020              Requested by: [name], MD [address] [place],  [place] [address] PCP: [name], MD  Chief Complaint  Patient presents with   Left Ankle - Fracture      HPI: Patient is a 60 year old woman who is 10 weeks status post nondisplaced Weber B left ankle fracture.  She is currently ambulating in an ASO.  She states that symptoms continue to improve.  Assessment & Plan: Visit Diagnoses:  1. Ankle fracture, left, closed, initial encounter   2. Closed fracture of left ankle with routine healing, subsequent encounter     Plan: Recommended discontinuing the ASO for short distances and on flat terrain to help build up strength.  Use the ASO on uneven terrain or for long activities until her strength improves.  Patient states she is not interested in physical therapy at this time.  She will follow-up as needed.  Follow-Up Instructions: Return if symptoms worsen or fail to improve.   Ortho Exam  Patient is alert, oriented, no adenopathy, well-dressed, normal affect, normal respiratory effort. Examination patient has pain-free range of motion of the left ankle.  There is no ecchymosis or bruising she does have some discomfort inferior to the fibula most likely secondary to scar tissue.  Imaging: XR Ankle Complete Left  Result Date: 12/31/2020 Three-view radiographs of the left ankle shows a congruent mortise the fracture is well-healed with callus formation the fibula is out to length.  No images are attached to the encounter.  Labs: Lab Results  Component Value Date   HGBA1C 5.8 (H) 11/02/2020   HGBA1C 5.8 09/24/2020   HGBA1C 6.1 (H) 02/10/2020   ESRSEDRATE 31 (H) 02/09/2020   ESRSEDRATE 39 (H) 02/01/2020   ESRSEDRATE 23 (H) 01/31/2020   CRP 1.7 (H) 02/01/2020   REPTSTATUS 01/31/2020 FINAL 01/31/2020   CULT   05/30/2013    NO GROWTH 5 DAYS Performed at [facility]   LABORGA  01/12/2017    Three or more organisms present,each greater than 10,000 CFU/mL.These organisms,commonly found on external and internal genitalia,are considered to be colonizers.No further testing performed.      Lab Results  Component Value Date   ALBUMIN 3.6 11/02/2020   ALBUMIN 3.6 09/24/2020   ALBUMIN 3.5 02/07/2020    Lab Results  Component Value Date   MG 2.5 (H) 07/26/2015   MG 2.6 (H) 07/25/2015   MG 2.5 (H) 07/24/2015   Lab Results  Component Value Date   VD25OH 26.05 (L) 09/24/2020    No results found for: PREALBUMIN CBC EXTENDED Latest Ref Rng & Units 11/03/2020 11/02/2020 11/02/2020  WBC 4.0 - 10.5 K/uL 14.1(H) - 18.5(H)  RBC 3.87 - 5.11 MIL/uL 4.51 - 4.76  HGB 12.0 - 15.0 g/dL 50.0 93.8 18.2  HCT 99.3 - 46.0 % 41.6 39.0 44.1  PLT 150 - 400 K/uL 312 - 369  NEUTROABS 1.4 - 7.7 K/uL - - -  LYMPHSABS 0.7 - 4.0 K/uL - - -     There is no height or weight on file to calculate BMI.  Orders:  Orders Placed This Encounter  Procedures   XR Ankle Complete Left   No orders of the defined types were placed in this  encounter.    Procedures: No procedures performed  Clinical Data: No additional findings.  ROS:  All other systems negative, except as noted in the HPI. Review of Systems  Objective: Vital Signs: LMP 03/08/2013   Specialty Comments:  No specialty comments available.  PMFS History: Patient Active Problem List   Diagnosis Date Noted   STEMI (ST elevation myocardial infarction) (HCC) 11/02/2020   STEMI involving right coronary artery (HCC) 11/02/2020   Medicare annual wellness visit, initial 10/01/2020   Cannabis dependence (HCC) 04/30/2020   Basilar artery stenosis 02/18/2020   TIA (transient ischemic attack) 02/08/2020   Chronic combined systolic and diastolic CHF (congestive heart failure) (HCC) 02/08/2020   Elevated troponin 02/04/2020   Chronic kidney  disease, stage 3a (HCC) 08/07/2019   Knee mass, right 11/28/2018   Advanced care planning/counseling discussion 07/27/2018   Unilateral primary osteoarthritis, left knee 10/28/2017   COPD  GOLD II/III still smoking/ 02 dep  10/20/2017   Cor pulmonale (chronic) (HCC) 09/16/2017   Diastolic dysfunction 08/26/2017   Grief 02/24/2017   Chronic respiratory failure with hypoxia (HCC) 07/31/2015   Type 2 diabetes, controlled, with peripheral neuropathy (HCC)    COPD exacerbation (HCC) 09/14/2014   OSA on CPAP 09/14/2014   Health maintenance examination 09/14/2014   Upper airway cough syndrome 09/22/2013   Chronic pain syndrome 05/30/2013   Migraine, unspecified, without mention of intractable migraine without mention of status migrainosus 05/30/2013   Pyelonephritis 05/05/2012   Asthmatic bronchitis 02/27/2008   Leukocytosis 02/22/2008   NONSPECIFIC ABNORMAL ELECTROCARDIOGRAM 02/22/2008   HYPERTENSION, BENIGN ESSENTIAL 06/23/2007   HYPERTRIGLYCERIDEMIA 06/01/2007   Morbid obesity with BMI of 40.0-44.9, adult (HCC) 06/01/2007   Mood disorder (HCC) 06/01/2007   Cigarette smoker 06/01/2007   Left leg swelling 05/04/2007   Past Medical History:  Diagnosis Date   Acute pancreatitis    Asthma    main dyspnea thought due to deconditioning (10/2013)   Bipolar I disorder, most recent episode (or current) manic, unspecified    pt denies this   Bronchitis, chronic obstructive (HCC) 2008 & 2009   FeV1 64% TLC 105% DLCO 55% 2008  -  FeV1 81% FeF 25-75 43% 2009   COPD (chronic obstructive pulmonary disease) (HCC)    emphysema, not an issue per pulm (10/2013)   History of pyelonephritis 05/2012   HTN (hypertension)    Hx of migraines    Knee pain, right    Leukocytosis, unspecified    Lumbar disc disease with radiculopathy    multilevel spondylitic changes, scoliosis, and anterolisthesis L4 not thought to currently be surg candidate (Dr. Phoebe Perch, [facility]) (Dr. Ollen Bowl, [facility])   Nicotine addiction     Nonspecific abnormal electrocardiogram (ECG) (EKG)    Obesity    Pure hyperglyceridemia    RSV (respiratory syncytial virus pneumonia) 01/2020   hospitalization   Suicide attempt (HCC) 06/2001   Swelling of limb    Urge and stress incontinence 07/2012   ([facility])    Family History  Problem Relation Age of Onset   Heart disease Brother        MI in early 18's   Hypertension Mother    Hyperlipidemia Mother    Heart disease Mother    Hypertension Father    Asthma Father    Breast cancer Paternal Grandmother    Breast cancer Paternal Aunt     Past Surgical History:  Procedure Laterality Date   BUBBLE STUDY  04/02/2020   Procedure: BUBBLE STUDY;  Surgeon: Vesta Mixer, MD;  Location: [facility];  Service: Cardiovascular;;   CHOLECYSTECTOMY  07/1982   CORONARY/GRAFT ACUTE MI REVASCULARIZATION N/A 11/02/2020   Procedure: Coronary/Graft Acute MI Revascularization;  Surgeon: Yvonne Kendall, MD;  Location: [facility];  Service: Cardiovascular;  Laterality: N/A;   CT MAXILLOFACIAL WO/W CM  06/14/06   Left max mucocele   CT of chest  06/14/2006   Normal except c/w active inflammation / infection.  Left renal atrophy   ERCP / sphincterotomy - stenosis  01/15/06   IR ANGIO INTRA EXTRACRAN SEL COM CAROTID INNOMINATE BILAT MOD SED  02/09/2020   IR ANGIO VERTEBRAL SEL SUBCLAVIAN INNOMINATE UNI L MOD SED  02/09/2020   IR ANGIO VERTEBRAL SEL VERTEBRAL UNI R MOD SED  02/09/2020   IR ANGIO VERTEBRAL SEL VERTEBRAL UNI R MOD SED  02/13/2020   IR US GUIDE VASC ACCESS RIGHT  02/09/2020   IR US GUIDE VASC ACCESS RIGHT  02/13/2020   LEFT HEART CATH AND CORONARY ANGIOGRAPHY N/A 11/02/2020   Procedure: LEFT HEART CATH AND CORONARY ANGIOGRAPHY;  Surgeon: Yvonne Kendall, MD;  Location: [facility];  Service: Cardiovascular;  Laterality: N/A;   LOOP RECORDER INSERTION N/A 04/02/2020   Procedure: LOOP RECORDER INSERTION;  Surgeon: Marinus Maw, MD;  Location: [facility];   Service: Cardiovascular;  Laterality: N/A;   lumbar MRI  11/2010   multilevel spondylitic changes with upper lumbar scoliosis and anterolisthesis of L4 on 5 with some L foraminal narrowing esp at L/3 with concavity of scoliosis, some central stenosis and biforaminal narrowing at L4/5 with spondylolisthesis   [place] - Pneumonia, acute asthma, left maxillary sinusitis  01/11 - 06/18/2006   RADIOLOGY WITH ANESTHESIA N/A 02/13/2020   Procedure: Carotid Stent;  Surgeon: Julieanne Cotton, MD;  Location: [facility] OR;  Service: Radiology;  Laterality: N/A;   Retained stone     ERCP secondary to retained stone   Right knee surgery  1984,1989,1989,1991,1994   Reconstructions for ACL insuff   TEE WITHOUT CARDIOVERSION N/A 04/02/2020   Procedure: TRANSESOPHAGEAL ECHOCARDIOGRAM (TEE);  Surgeon: Elease Hashimoto Deloris Ping, MD;  Location: [facility] ENDOSCOPY;  Service: Cardiovascular;  Laterality: N/A;   TUBAL LIGATION     Social History   Occupational History   Occupation: Administrator, arts: [employer]     Employer: [employer]   Tobacco Use   Smoking status: Every Day    Packs/day: 0.25    Years: 41.00    Pack years: 10.25    Types: Cigarettes   Smokeless tobacco: Never  Vaping Use   Vaping Use: Never used  Substance and Sexual Activity   Alcohol use: Yes    Alcohol/week: 0.0 standard drinks    Comment: occasional   Drug use: Not Currently    Types: Marijuana   Sexual activity: Not on file

## 2021-01-02 ENCOUNTER — Other Ambulatory Visit: Payer: Self-pay | Admitting: Family Medicine

## 2021-01-02 NOTE — Telephone Encounter (Signed)
ERx 

## 2021-01-05 DIAGNOSIS — G4733 Obstructive sleep apnea (adult) (pediatric): Secondary | ICD-10-CM | POA: Diagnosis not present

## 2021-01-06 NOTE — Progress Notes (Signed)
Cardiology Office Note:   Date:  01/07/2021  NAME:  Brenda [name]    MRN: [id number] DOB:  13-Aug-1960   PCP:  [name], MD  Cardiologist:  Reatha Harps, MD  Electrophysiologist:  None   Referring MD: [name], MD   Chief Complaint  Patient presents with   Follow-up     History of Present Illness:   Brenda [name] is a 60 y.o. female with a hx of CAD (STEMI s/p RCA PCI), systolic HF EF 45-50%, HLD, COPD, obesity, HTN, HFpEF who presents for follow-up. Had inferior STEMI 10/2020 s/p RCA PCI.  Ejection fraction was 45-50%.  She was put on Jardiance, Entresto, metoprolol succinate, Aldactone.  She denies any chest pain today.  Right radial cath site clean and dry.  She is on chronic oxygen therapy.  She is short of breath with activity but this is unchanged.  No shortness of breath while sitting.  Most recent LDL cholesterol was at goal.  He seems to be doing well since her inferior STEMI.  On appropriate guideline directed medical therapy.  Suspect her ejection fraction will recover.  EKG without acute ischemic changes.  Problem List 1. NSTEMI 01/30/2020 -stress induced CM 2. Systolic HF -EF 30-35% with apical akinesis 01/31/2020 in setting of COPD/hypercarbic respiratory failure -> Takotsubo CM -EF 40-45% with improvement in WMA 02/08/2020 -EF 55-60% 06/28/2020 -EF 45-50% 11/02/2020 (Inferior STEMI) 3. STEMI 11/02/2020 -PCI to dRCA 4. COPD -severe COPD -2L 5. Tobacco abuse 6. HTN 7. DM -A1c 6.1 8. HLD -T chol 117, HDL 37, LDL 52, TG 139 9. CVA 01/2020/Cryptogenic -basilar artery stenosis? -TEE negative -loop recorder placed 10. SVT  Past Medical History: Past Medical History:  Diagnosis Date   Acute pancreatitis    Asthma    main dyspnea thought due to deconditioning (10/2013)   Bipolar I disorder, most recent episode (or current) manic, unspecified    pt denies this   Bronchitis, chronic obstructive (HCC) 2008 & 2009   FeV1 64% TLC 105% DLCO 55% 2008  -   FeV1 81% FeF 25-75 43% 2009   COPD (chronic obstructive pulmonary disease) (HCC)    emphysema, not an issue per pulm (10/2013)   History of pyelonephritis 05/2012   HTN (hypertension)    Hx of migraines    Knee pain, right    Leukocytosis, unspecified    Lumbar disc disease with radiculopathy    multilevel spondylitic changes, scoliosis, and anterolisthesis L4 not thought to currently be surg candidate (Dr. Phoebe Perch, [facility]) (Dr. Ollen Bowl, [place])   Nicotine addiction    Nonspecific abnormal electrocardiogram (ECG) (EKG)    Obesity    Pure hyperglyceridemia    RSV (respiratory syncytial virus pneumonia) 01/2020   hospitalization   Suicide attempt (HCC) 06/2001   Swelling of limb    Urge and stress incontinence 07/2012   ([facility])    Past Surgical History: Past Surgical History:  Procedure Laterality Date   BUBBLE STUDY  04/02/2020   Procedure: BUBBLE STUDY;  Surgeon: Vesta Mixer, MD;  Location: [facility] ENDOSCOPY;  Service: Cardiovascular;;   CHOLECYSTECTOMY  07/1982   CORONARY/GRAFT ACUTE MI REVASCULARIZATION N/A 11/02/2020   Procedure: Coronary/Graft Acute MI Revascularization;  Surgeon: Yvonne Kendall, MD;  Location: [facility] INVASIVE CV LAB;  Service: Cardiovascular;  Laterality: N/A;   CT MAXILLOFACIAL WO/W CM  06/14/06   Left max mucocele   CT of chest  06/14/2006   Normal except c/w active inflammation / infection.  Left renal atrophy  ERCP / sphincterotomy - stenosis  01/15/06   IR ANGIO INTRA EXTRACRAN SEL COM CAROTID INNOMINATE BILAT MOD SED  02/09/2020   IR ANGIO VERTEBRAL SEL SUBCLAVIAN INNOMINATE UNI L MOD SED  02/09/2020   IR ANGIO VERTEBRAL SEL VERTEBRAL UNI R MOD SED  02/09/2020   IR ANGIO VERTEBRAL SEL VERTEBRAL UNI R MOD SED  02/13/2020   IR US GUIDE VASC ACCESS RIGHT  02/09/2020   IR US GUIDE VASC ACCESS RIGHT  02/13/2020   LEFT HEART CATH AND CORONARY ANGIOGRAPHY N/A 11/02/2020   Procedure: LEFT HEART CATH AND CORONARY ANGIOGRAPHY;  Surgeon: Yvonne KendallEnd, Christopher, MD;  Location:  [facility] INVASIVE CV LAB;  Service: Cardiovascular;  Laterality: N/A;   LOOP RECORDER INSERTION N/A 04/02/2020   Procedure: LOOP RECORDER INSERTION;  Surgeon: Marinus Mawaylor, Gregg W, MD;  Location: [facility] INVASIVE CV LAB;  Service: Cardiovascular;  Laterality: N/A;   lumbar MRI  11/2010   multilevel spondylitic changes with upper lumbar scoliosis and anterolisthesis of L4 on 5 with some L foraminal narrowing esp at L/3 with concavity of scoliosis, some central stenosis and biforaminal narrowing at L4/5 with spondylolisthesis    - Pneumonia, acute asthma, left maxillary sinusitis  01/11 - 06/18/2006   RADIOLOGY WITH ANESTHESIA N/A 02/13/2020   Procedure: Carotid Stent;  Surgeon: Julieanne Cottoneveshwar, Sanjeev, MD;  Location: [facility] OR;  Service: Radiology;  Laterality: N/A;   Retained stone     ERCP secondary to retained stone   Right knee surgery  1984,1989,1989,1991,1994   Reconstructions for ACL insuff   TEE WITHOUT CARDIOVERSION N/A 04/02/2020   Procedure: TRANSESOPHAGEAL ECHOCARDIOGRAM (TEE);  Surgeon: Vesta MixerNahser, Philip J, MD;  [facility];  Service: Cardiovascular;  Laterality: N/A;   TUBAL LIGATION      Current Medications: Current Meds  Medication Sig   albuterol (PROVENTIL) (2.5 MG/3ML) 0.083% nebulizer solution INHALE 1 VIAL VIA NEBULIZER EVERY 6 HOURS AS NEEDED FOR WHEEZING/SHORTNESS OF BREATH   ARIPiprazole (ABILIFY) 5 MG tablet TAKE 1 TABLET BY MOUTH EVERY DAY (Patient taking differently: Take 5 mg by mouth daily.)   aspirin EC 81 MG EC tablet Take 1 tablet (81 mg total) by mouth daily. Swallow whole.   atorvastatin (LIPITOR) 80 MG tablet Take 1 tablet (80 mg total) by mouth at bedtime.   benzonatate (TESSALON) 200 MG capsule Take 1 capsule (200 mg total) by mouth 3 (three) times daily as needed for cough. Swallow whole, to not bite pill   BREZTRI AEROSPHERE 160-9-4.8 MCG/ACT AERO INHALE 2 PUFFS INTO LUNGS 2 TIMES DAILY (Patient taking differently: Inhale 2 puffs into the lungs in the morning and  at bedtime.)   cetirizine (ZYRTEC) 10 MG tablet Take 10 mg by mouth at bedtime.   Cholecalciferol (VITAMIN D3) 25 MCG (1000 UT) CAPS Take 1 capsule (1,000 Units total) by mouth daily.   dextromethorphan-guaiFENesin (MUCINEX DM) 30-600 MG 12hr tablet Take 1 tablet by mouth 2 (two) times daily as needed for cough.   empagliflozin (JARDIANCE) 10 MG TABS tablet Take 1 tablet (10 mg total) by mouth daily before breakfast.   escitalopram (LEXAPRO) 20 MG tablet TAKE 1 AND 1/2 TABLETS DAILY BY MOUTH   famotidine (PEPCID) 20 MG tablet TAKE 1 TABLET BY MOUTH EVERYDAY AT BEDTIME (Patient taking differently: Take 20 mg by mouth at bedtime.)   fluticasone (FLONASE) 50 MCG/ACT nasal spray PLACE 1 SPRAY INTO BOTH NOSTRILS 2 (TWO) TIMES DAILY.   furosemide (LASIX) 20 MG tablet TAKE 1 TABLET BY MOUTH EVERY DAY (Patient taking differently: Take 20 mg  by mouth daily.)   gabapentin (NEURONTIN) 300 MG capsule TAKE 2 TABLETS IN THE MORNING AND 3 TABLETS AT NIGHT (Patient taking differently: Take 300 mg by mouth See admin instructions. TAKE 2 TABLETS IN THE MORNING AND 3 TABLETS AT NIGHT)   LORazepam (ATIVAN) 0.5 MG tablet TAKE 1/2 TO 1 TABLET BY MOUTH TWICE A DAY AS NEEDED FOR ANXIETY   metFORMIN (GLUCOPHAGE) 500 MG tablet TAKE 1 TABLET BY MOUTH EVERYDAY AT BEDTIME (Patient taking differently: Take 500 mg by mouth daily with breakfast.)   metoprolol succinate (TOPROL-XL) 25 MG 24 hr tablet Take 1 tablet (25 mg total) by mouth daily. (Patient taking differently: Take 25 mg by mouth in the morning.)   Multiple Vitamin (MULTIVITAMIN WITH MINERALS) TABS tablet Take 1 tablet by mouth daily.   nystatin (MYCOSTATIN) 100000 UNIT/ML suspension TAKE 1 TEASPOONFUL BY MOUTH 3 TIMES DAILY AS NEEDED FOR MOUTH SORES. (Patient taking differently: Take 5 mLs by mouth 3 (three) times daily as needed (mouth sores).)   oxyCODONE-acetaminophen (PERCOCET/ROXICET) 5-325 MG tablet Take 1 tablet by mouth every 4 (four) hours as needed for severe  pain.   PROAIR RESPICLICK 108 (90 Base) MCG/ACT AEPB INHALE 2 PUFFS INTO THE LUNGS EVERY 6 HOURS AS NEEDED FOR DYSPNEA/WHEEZE   sacubitril-valsartan (ENTRESTO) 24-26 MG Take 1 tablet by mouth 2 (two) times daily.   spironolactone (ALDACTONE) 25 MG tablet Take 12.5 mg by mouth daily.   ticagrelor (BRILINTA) 90 MG TABS tablet Take 1 tablet (90 mg total) by mouth 2 (two) times daily.     Allergies:    Metolazone   Social History: Social History   Socioeconomic History   Marital status: Widowed    Spouse name: Not on file   Number of children: 3   Years of education: Not on file   Highest education level: Not on file  Occupational History   Occupation: Geographical information systems officer    Employer: [employer]     Employer: [employer]   Tobacco Use   Smoking status: Every Day    Packs/day: 0.25    Years: 41.00    Pack years: 10.25    Types: Cigarettes   Smokeless tobacco: Never  Vaping Use   Vaping Use: Never used  Substance and Sexual Activity   Alcohol use: Yes    Alcohol/week: 0.0 standard drinks    Comment: occasional   Drug use: Not Currently    Types: Marijuana   Sexual activity: Not on file  Other Topics Concern   Not on file  Social History Narrative   Lives with husband and son and daughter in [place] and grandchild   Occupation: Media planner at [employer]   Activity: limited by dyspnea   Diet: good water, fruits/vegetables daily   Social Determinants of Health   Financial Resource Strain: Not on file  Food Insecurity: Not on file  Transportation Needs: Not on file  Physical Activity: Not on file  Stress: Not on file  Social Connections: Not on file     Family History: The patient's family history includes Asthma in her father; Breast cancer in her paternal aunt and paternal grandmother; Heart disease in her brother and mother; Hyperlipidemia in her mother; Hypertension in her father and mother.  ROS:   All other ROS reviewed and  negative. Pertinent positives noted in the HPI.     EKGs/Labs/Other Studies Reviewed:   The following studies were personally reviewed by me today:  EKG:  EKG is ordered today.  The  ekg ordered today demonstrates normal sinus rhythm heart rate 64, nonspecific ST-T changes, and was personally reviewed by me.   TTE 11/03/2020  1. Left ventricular ejection fraction, by estimation, is 45 to 50%. The  left ventricle has mildly decreased function. The left ventricle  demonstrates regional wall motion abnormalities (see scoring  diagram/findings for description). Left ventricular  diastolic parameters are consistent with Grade II diastolic dysfunction  (pseudonormalization). Elevated left atrial pressure.   2. Right ventricular systolic function is normal. The right ventricular  size is normal.   3. The mitral valve is normal in structure. No evidence of mitral valve  regurgitation. No evidence of mitral stenosis.   4. The aortic valve is normal in structure. Aortic valve regurgitation is  not visualized. No aortic stenosis is present.   5. The inferior vena cava is dilated in size with >50% respiratory  variability, suggesting right atrial pressure of 8 mmHg.   LHC 11/02/2020  Severe single-vessel coronary artery disease with 95% thrombotic stenosis of distal RCA and TIMI-2 flow into its distal branches. Nonobstructive LAD/LCx disease.  The distal LAD tapers to a small vessel, terminating before the apex. Normal left ventricular filling pressure (LVEDP 12 mmHg). Successful PCI to distal RCA using Synergy 3.5 x 20 mm drug-eluting stent (postdilated to 4.1 mm) with 0% residual stenosis and TIMI-3 flow.   Recommendations: Dual antiplatelet therapy with aspirin and ticagrelor for at least 12 months. Aggressive secondary prevention, including high intensity statin therapy and tobacco cessation. Obtain transthoracic echocardiogram.   Recent Labs: 01/30/2020: B Natriuretic Peptide 47.7 02/08/2020:  TSH 0.821 11/02/2020: ALT 15 11/03/2020: BUN 13; Creatinine, Ser 0.92; Hemoglobin 13.3; Platelets 312; Potassium 3.8; Sodium 139   Recent Lipid Panel    Component Value Date/Time   CHOL 118 11/02/2020 1423   TRIG 135 11/02/2020 1423   HDL 42 11/02/2020 1423   CHOLHDL 2.8 11/02/2020 1423   VLDL 27 11/02/2020 1423   LDLCALC 49 11/02/2020 1423    Physical Exam:   VS:  BP (!) 106/58 (BP Location: Left Arm, Patient Position: Sitting, Cuff Size: Large)   Pulse 64   Ht 5\' 1"  (1.549 m)   Wt 231 lb 6.4 oz (105 kg)   LMP 03/08/2013   SpO2 99%   BMI 43.72 kg/m    Wt Readings from Last 3 Encounters:  01/07/21 231 lb 6.4 oz (105 kg)  10/20/20 225 lb (102.1 kg)  10/08/20 235 lb (106.6 kg)    General: Well nourished, well developed, in no acute distress Head: Atraumatic, normal size  Eyes: PEERLA, EOMI  Neck: Supple, no JVD Endocrine: No thryomegaly Cardiac: Normal S1, S2; RRR; no murmurs, rubs, or gallops Lungs: Diffuse rhonchi bilaterally Abd: Soft, nontender, no hepatomegaly  Ext: No edema, pulses 2+ Musculoskeletal: No deformities, BUE and BLE strength normal and equal Skin: Warm and dry, no rashes   Neuro: Alert and oriented to person, place, time, and situation, CNII-XII grossly intact, no focal deficits  Psych: Normal mood and affect   ASSESSMENT:   Brenda [name] is a 60 y.o. female who presents for the following: 1. Coronary artery disease involving native coronary artery of native heart without angina pectoris   2. Mixed hyperlipidemia   3. Chronic systolic heart failure (HCC)     PLAN:   1. Coronary artery disease involving native coronary artery of native heart without angina pectoris 2. Mixed hyperlipidemia -Inferior STEMI in June 2022.  Status post PCI to the distal RCA.  No symptoms of  chest pain or trouble breathing today.  EKG is nonischemic.  Right radial cath site is clean and dry.  No further restrictions. -Continue aspirin and Brilinta for 1 year.  After 1 year  continue aspirin. -Most recent LDL cholesterol less than 70.  Continue Lipitor 80. -Smoking is her biggest issue.  I recommended that she quit.  She will work on this.  3. Chronic systolic heart failure (HCC) -Ejection fraction 45 to 50%.  This was in the setting of inferior STEMI.  She does have a history of a stress-induced cardiomyopathy with recovery of EF.  This is separate. -Euvolemic on exam.  Continue Lasix 20 mg daily.  She is on metoprolol succinate 25 mg daily, Entresto 24-26 mg twice daily, Aldactone 12.5 mg daily, Jardiance 10 mg daily. -BP is low.  No further room for titration.  We will continue with what we have.  I suspect her EF will recover.  We will see her back in 3 months with an echocardiogram before that time.  Disposition: Return in about 3 months (around 04/09/2021).  Medication Adjustments/Labs and Tests Ordered: Current medicines are reviewed at length with the patient today.  Concerns regarding medicines are outlined above.  Orders Placed This Encounter  Procedures   EKG 12-Lead   ECHOCARDIOGRAM COMPLETE    No orders of the defined types were placed in this encounter.   Patient Instructions  Medication Instructions:  The current medical regimen is effective;  continue present plan and medications.  *If you need a refill on your cardiac medications before your next appointment, please call your pharmacy*  Testing/Procedures:  Echocardiogram (4 months) - Your physician has requested that you have an echocardiogram. Echocardiography is a painless test that uses sound waves to create images of your heart. It provides your doctor with information about the size and shape of your heart and how well your heart's chambers and valves are working. This procedure takes approximately one hour. There are no restrictions for this procedure. This will be performed at our [facility] location - [address].    Follow-Up: At [facility], you and your  health needs are our priority.  As part of our continuing mission to provide you with exceptional heart care, we have created designated Provider Care Teams.  These Care Teams include your primary Cardiologist (physician) and Advanced Practice Providers (APPs -  Physician Assistants and Nurse Practitioners) who all work together to provide you with the care you need, when you need it.  We recommend signing up for the patient portal called "MyChart".  Sign up information is provided on this After Visit Summary.  MyChart is used to connect with patients for Virtual Visits (Telemedicine).  Patients are able to view lab/test results, encounter notes, upcoming appointments, etc.  Non-urgent messages can be sent to your provider as well.   To learn more about what you can do with MyChart, go to [email or web address].    Your next appointment:   4 month(s)  The format for your next appointment:   In Person  Provider:   You may see Reatha Harps, MD or one of the following Advanced Practice Providers on your designated Care Team:   Azalee Course, PA-C Marjie Skiff, [place]     Time Spent with Patient: I have spent a total of 35 minutes with patient reviewing hospital notes, telemetry, EKGs, labs and examining the patient as well as establishing an assessment and plan that was discussed with the patient.  >  50% of time was spent in direct patient care.  Signed, Lenna Gilford. Flora Lipps, MD, [facility]  [facility]  [address] [place], [place] [address] [phone]  01/07/2021 3:22 PM

## 2021-01-07 ENCOUNTER — Other Ambulatory Visit: Payer: Self-pay

## 2021-01-07 ENCOUNTER — Encounter: Payer: Self-pay | Admitting: Cardiovascular Disease

## 2021-01-07 ENCOUNTER — Ambulatory Visit (INDEPENDENT_AMBULATORY_CARE_PROVIDER_SITE_OTHER): Payer: Medicare Other | Admitting: Cardiovascular Disease

## 2021-01-07 VITALS — BP 106/58 | HR 64 | Ht 61.0 in | Wt 231.4 lb

## 2021-01-07 DIAGNOSIS — I5022 Chronic systolic (congestive) heart failure: Secondary | ICD-10-CM | POA: Diagnosis not present

## 2021-01-07 DIAGNOSIS — I2510 Atherosclerotic heart disease of native coronary artery without angina pectoris: Secondary | ICD-10-CM

## 2021-01-07 DIAGNOSIS — E782 Mixed hyperlipidemia: Secondary | ICD-10-CM

## 2021-01-07 NOTE — Patient Instructions (Addendum)
Medication Instructions:  The current medical regimen is effective;  continue present plan and medications.  *If you need a refill on your cardiac medications before your next appointment, please call your pharmacy*  Testing/Procedures:  Echocardiogram (4 months) - Your physician has requested that you have an echocardiogram. Echocardiography is a painless test that uses sound waves to create images of your heart. It provides your doctor with information about the size and shape of your heart and how well your heart's chambers and valves are working. This procedure takes approximately one hour. There are no restrictions for this procedure. This will be performed at our [facility] location - [address], [address].    Follow-Up: At [facility], you and your health needs are our priority.  As part of our continuing mission to provide you with exceptional heart care, we have created designated Provider Care Teams.  These Care Teams include your primary Cardiologist (physician) and Advanced Practice Providers (APPs -  Physician Assistants and Nurse Practitioners) who all work together to provide you with the care you need, when you need it.  We recommend signing up for the patient portal called "MyChart".  Sign up information is provided on this After Visit Summary.  MyChart is used to connect with patients for Virtual Visits (Telemedicine).  Patients are able to view lab/test results, encounter notes, upcoming appointments, etc.  Non-urgent messages can be sent to your provider as well.   To learn more about what you can do with MyChart, go to [email or web address].    Your next appointment:   4 month(s)  The format for your next appointment:   In Person  Provider:   You may see Reatha Harps, MD or one of the following Advanced Practice Providers on your designated Care Team:   Azalee Course, PA-C Marjie Skiff, [place]

## 2021-01-09 ENCOUNTER — Other Ambulatory Visit: Payer: Self-pay | Admitting: Family Medicine

## 2021-01-14 NOTE — Progress Notes (Signed)
Carelink Summary Report / Loop Recorder 

## 2021-01-27 ENCOUNTER — Ambulatory Visit (INDEPENDENT_AMBULATORY_CARE_PROVIDER_SITE_OTHER): Payer: Medicare Other

## 2021-01-27 ENCOUNTER — Other Ambulatory Visit: Payer: Self-pay | Admitting: Family Medicine

## 2021-01-27 DIAGNOSIS — G459 Transient cerebral ischemic attack, unspecified: Secondary | ICD-10-CM | POA: Diagnosis not present

## 2021-01-28 LAB — CUP PACEART REMOTE DEVICE CHECK
Date Time Interrogation Session: 20220826170518
Implantable Pulse Generator Implant Date: 20211102

## 2021-01-29 ENCOUNTER — Other Ambulatory Visit: Payer: Self-pay | Admitting: Family Medicine

## 2021-01-29 ENCOUNTER — Other Ambulatory Visit: Payer: Self-pay | Admitting: Internal Medicine

## 2021-01-29 NOTE — Telephone Encounter (Addendum)
Per last OV (10/01/21, AWV), pt started atorvastatin 40 mg daily.  However, looks like strength was increased to 80 mg daily.  Spoke with pt asking about medication.  Confirms she is taking 80 mg daily.    E-scribed refill.  Next OV:  04/04/21, 6 mo f/u.  FYI to Dr. Reece Agar.

## 2021-01-31 ENCOUNTER — Telehealth: Payer: Self-pay | Admitting: Pharmacist

## 2021-01-31 NOTE — Progress Notes (Signed)
Chronic Care Management Pharmacy Assistant   Name: Brenda [name]  MRN: [id number] DOB: Jun 30, 1960  Brenda [name] is an 59 y.o. year old female who presents for his initial CCM visit with the clinical pharmacist.  Reason for Encounter: Initial Questions   Recent office visits:   10/01/20 Brenda Boyden, MD-PCP (Medicare annual wellness visit, initial) Meds ordered:atorvastatin (LIPITOR) 40 MG tabletCholecalciferol (VITAMIN D3) 25 MCG (1000 UT) CAPS  Recent consult visits:   01/07/21 Brenda Rives, MD-Cardiology (Coronary artery disease involving native coronary artery of native heart without angina pectoris) No med changes   12/31/20 Brenda Mustard, MD-Orthopedic Surgery (Ankle fracture, left, closed) ordered: XR Ankle Complete Left, No med changes  12/03/20 Brenda Mustard, MD-Orthopedic Surgery (Pain in left ankle and joints of left foot)  11/14/20 Persons, [name], PA-Orthopedic Surgery (Pain in left ankle and joints of left foot)  10/31/20 Brenda Mustard, MD-Orthopedic Surgery (Pain in left ankle and joints of left foot) Meds ordered:oxyCODONE-acetaminophen   09/13/20 Brenda Rives, MD-Cardiology (Chronic systolic heart failure) No med changes  Hospital visits:  Medication Reconciliation was completed by comparing discharge summary, patient's EMR and Pharmacy list, and upon discussion with patient.  Admitted to the hospital on 11/02/20 due to [facility]. Discharge date was 11/04/20. Discharged from [facility] ?? -started nitroglycerin 0.4 mg     Brilinta 90 mg -Changed atorvastatin 80 mg          Spironolactone 12.5 mg- Stopped None ID  2. Admitted to the hospital on 10/20/20 due to Closed Fracture of left ankle. Discharge date was 09/2220. Discharged from [facility]  ?? Meds ordered: oxycodone 5 mg  Medications that remain the same after Hospital Discharge:??  -All other medications will remain the same.    Medications: Outpatient  Encounter Medications as of 01/31/2021  Medication Sig Note   albuterol (PROVENTIL) (2.5 MG/3ML) 0.083% nebulizer solution INHALE 1 VIAL VIA NEBULIZER EVERY 6 HOURS AS NEEDED FOR WHEEZING/SHORTNESS OF BREATH    ARIPiprazole (ABILIFY) 5 MG tablet TAKE 1 TABLET BY MOUTH EVERY DAY (Patient taking differently: Take 5 mg by mouth daily.)    aspirin EC 81 MG EC tablet Take 1 tablet (81 mg total) by mouth daily. Swallow whole.    atorvastatin (LIPITOR) 80 MG tablet TAKE 1 TABLET BY MOUTH EVERYDAY AT BEDTIME    benzonatate (TESSALON) 200 MG capsule Take 1 capsule (200 mg total) by mouth 3 (three) times daily as needed for cough. Swallow whole, to not bite pill    BREZTRI AEROSPHERE 160-9-4.8 MCG/ACT AERO INHALE 2 PUFFS INTO LUNGS 2 TIMES DAILY (Patient taking differently: Inhale 2 puffs into the lungs in the morning and at bedtime.)    cetirizine (ZYRTEC) 10 MG tablet Take 10 mg by mouth at bedtime.    Cholecalciferol (VITAMIN D3) 25 MCG (1000 UT) CAPS Take 1 capsule (1,000 Units total) by mouth daily.    dextromethorphan-guaiFENesin (MUCINEX DM) 30-600 MG 12hr tablet Take 1 tablet by mouth 2 (two) times daily as needed for cough.    empagliflozin (JARDIANCE) 10 MG TABS tablet Take 1 tablet (10 mg total) by mouth daily before breakfast.    escitalopram (LEXAPRO) 20 MG tablet TAKE 1 AND 1/2 TABLETS DAILY BY MOUTH    famotidine (PEPCID) 20 MG tablet TAKE 1 TABLET BY MOUTH EVERYDAY AT BEDTIME (Patient taking differently: Take 20 mg by mouth at bedtime.)    fluticasone (FLONASE) 50 MCG/ACT nasal spray PLACE 1 SPRAY INTO BOTH  NOSTRILS 2 (TWO) TIMES DAILY.    furosemide (LASIX) 20 MG tablet TAKE 1 TABLET BY MOUTH EVERY DAY (Patient taking differently: Take 20 mg by mouth daily.)    gabapentin (NEURONTIN) 300 MG capsule TAKE 2 TABLETS IN THE MORNING AND 3 TABLETS AT NIGHT (Patient taking differently: Take 300 mg by mouth See admin instructions. TAKE 2 TABLETS IN THE MORNING AND 3 TABLETS AT NIGHT)    LORazepam  (ATIVAN) 0.5 MG tablet TAKE 1/2 TO 1 TABLET BY MOUTH TWICE A DAY AS NEEDED FOR ANXIETY    metFORMIN (GLUCOPHAGE) 500 MG tablet TAKE 1 TABLET BY MOUTH EVERYDAY AT BEDTIME (Patient taking differently: Take 500 mg by mouth daily with breakfast.)    metoprolol succinate (TOPROL-XL) 25 MG 24 hr tablet Take 1 tablet (25 mg total) by mouth daily. (Patient taking differently: Take 25 mg by mouth in the morning.)    Multiple Vitamin (MULTIVITAMIN WITH MINERALS) TABS tablet Take 1 tablet by mouth daily.    nitroGLYCERIN (NITROSTAT) 0.4 MG SL tablet Place 1 tablet (0.4 mg total) under the tongue every 5 (five) minutes as needed. (Patient not taking: Reported on 01/07/2021) 01/07/2021: Patient has if needed   nystatin (MYCOSTATIN) 100000 UNIT/ML suspension TAKE 1 TEASPOONFUL BY MOUTH 3 TIMES DAILY AS NEEDED FOR MOUTH SORES. (Patient taking differently: Take 5 mLs by mouth 3 (three) times daily as needed (mouth sores).)    oxyCODONE-acetaminophen (PERCOCET/ROXICET) 5-325 MG tablet Take 1 tablet by mouth every 4 (four) hours as needed for severe pain.    PROAIR RESPICLICK 108 (90 Base) MCG/ACT AEPB INHALE 2 PUFFS INTO THE LUNGS EVERY 6 HOURS AS NEEDED FOR DYSPNEA/WHEEZE    sacubitril-valsartan (ENTRESTO) 24-26 MG Take 1 tablet by mouth 2 (two) times daily.    spironolactone (ALDACTONE) 25 MG tablet Take 12.5 mg by mouth daily.    ticagrelor (BRILINTA) 90 MG TABS tablet Take 1 tablet (90 mg total) by mouth 2 (two) times daily.    No facility-administered encounter medications on file as of 01/31/2021.   Have you seen any other providers since your last visit? Patient stated she saw cardiologist last month  Any changes in your medications or health? Patient states that she has not had any medication changes  Any side effects from any medications? No  Do you have an symptoms or problems not managed by your medications? No  Any concerns about your health right now? No  Has your provider asked that you check blood  pressure, blood sugar, or follow special diet at home? Patient states that she does check blood pressure and blood sugar levels which have been normal  Do you get any type of exercise on a regular basis? Patient states that she does walk which is hard when she is on oxygen. She states she also does chair exercises  Can you think of a goal you would like to reach for your health? Patient states she wants to maintain her health  Do you have any problems getting your medications? No  Is there anything that you would like to discuss during the appointment? Patient states that she can't think of anything at this time to discuss  Please bring medications and supplements to appointment   Velvet Bathe Clinical Pharmacist Assistant [phone]   Time spent:40

## 2021-02-05 DIAGNOSIS — G4733 Obstructive sleep apnea (adult) (pediatric): Secondary | ICD-10-CM | POA: Diagnosis not present

## 2021-02-06 NOTE — Progress Notes (Signed)
Carelink Summary Report / Loop Recorder 

## 2021-02-07 ENCOUNTER — Other Ambulatory Visit: Payer: Self-pay

## 2021-02-07 ENCOUNTER — Telehealth: Payer: Self-pay | Admitting: Family Medicine

## 2021-02-07 ENCOUNTER — Ambulatory Visit (INDEPENDENT_AMBULATORY_CARE_PROVIDER_SITE_OTHER): Payer: Medicare Other | Admitting: Pharmacist

## 2021-02-07 DIAGNOSIS — I5042 Chronic combined systolic (congestive) and diastolic (congestive) heart failure: Secondary | ICD-10-CM | POA: Diagnosis not present

## 2021-02-07 DIAGNOSIS — I2510 Atherosclerotic heart disease of native coronary artery without angina pectoris: Secondary | ICD-10-CM | POA: Diagnosis not present

## 2021-02-07 DIAGNOSIS — F4322 Adjustment disorder with anxiety: Secondary | ICD-10-CM

## 2021-02-07 DIAGNOSIS — I10 Essential (primary) hypertension: Secondary | ICD-10-CM | POA: Diagnosis not present

## 2021-02-07 DIAGNOSIS — E1142 Type 2 diabetes mellitus with diabetic polyneuropathy: Secondary | ICD-10-CM

## 2021-02-07 DIAGNOSIS — F17210 Nicotine dependence, cigarettes, uncomplicated: Secondary | ICD-10-CM

## 2021-02-07 DIAGNOSIS — J449 Chronic obstructive pulmonary disease, unspecified: Secondary | ICD-10-CM

## 2021-02-07 NOTE — Patient Instructions (Signed)
Visit Information  Phone number for Pharmacist: [phone]  Thank you for meeting with me to discuss your medications! I look forward to working with you to achieve your health care goals. Below is a summary of what we talked about during the visit:   Goals Addressed             This Visit's Progress    Stop or Cut Down Tobacco Use       Timeframe:  Long-Range Goal Priority:  High Start Date:      02/07/21                       Expected End Date:   08/07/21                    Follow Up Date Dec 2022   - cut down number of cigarettes by one-half - use over-the-counter gum, patch or lozenges - use Quit Line 1-800-Quit Now    Why is this important?   To stop or cut down it is important to have support from a person or group of people who you can count on.  You will also need to think about the things that make you feel like smoking, then plan for how to handle them.    Notes:         Care Plan : CCM Pharmacy Care Plan  Updates made by Brenda [name], RPH since 02/07/2021 12:00 AM     Problem: Hypertension, Hyperlipidemia, Diabetes, Heart Failure, Coronary Artery Disease, GERD, COPD, Depression, Anxiety, and Tobacco use      Long-Range Goal: Disease management   Start Date: 02/07/2021  Expected End Date: 02/07/2022  This Visit's Progress: On track  Priority: High  Note:   Current Barriers:  Unable to independently monitor therapeutic efficacy Unable to achieve control of smoking   Pharmacist Clinical Goal(s):  Patient will achieve adherence to monitoring guidelines and medication adherence to achieve therapeutic efficacy achieve control of smoking as evidenced by pt report through collaboration with PharmD and provider.   Interventions: 1:1 collaboration with Brenda Boyden, MD regarding development and update of comprehensive plan of care as evidenced by provider attestation and co-signature Inter-disciplinary care team collaboration (see longitudinal plan of  care) Comprehensive medication review performed; medication list updated in electronic medical record  Hyperlipidemia: (LDL goal < 70) -Controlled - LDL was 98 in 08/2020 after being off statin for a few weeks, 49 in 10/2020 s/p STEMI and restarting atorvastatin 40 mg; pt endorses compliance with higher dose statin and denies side effects; she denies bleeding issues on DAPT -Hx STEMI 10/2020; hx TIA 01/2020 -Current treatment: Atorvastatin 80 mg daily Nitroglycerin 0.4 mg SL prn Brilinta 90 mg BID Aspirin 81 mg daily -Current dietary patterns: trying to cut back on portion size -Current exercise habits: trying to walk more; limited by COPD/need for O2 -Educated on Cholesterol goals; Benefits of statin for ASCVD risk reduction; -Recommended to continue current medication  Diabetes (A1c goal <7%) -Controlled - A1c most recently 5.8% (10/2020); A1c has been <6.5% since 04/2019; pt questions need for metformin and is concerned about metformin side effects - she read it can increase risk for dementia -Current home glucose readings fasting glucose: 90-110 -Current medications: Metformin 500 mg daily Jardiance 10 mg daily -Educated on A1c and blood sugar goals; Complications of diabetes including kidney damage, retinal damage, and cardiovascular disease; -Counseled on benefits of metformin, dementia risk - many studies have shown metformin  actually has a protective effect against dementia; diabetes itself has been linked to dementia as well -Given A1c in prediabetic range consistently over 2 years, overall low metformin dose, and concomitant SGLT-2 inhibitor, it would be reasonable to d/c metformin. Will discuss with PCP  Heart Failure / Hypertension (BP goal < 130/80, prevent exacerbations) -Controlled - BP is at goal and pt denies persistent swelling; she endorses compliance -Current home BP/HR readings: 116/78 -Last ejection fraction: 45-50% (Date: 11/03/20) -HF type: Combined Systolic and  Diastolic -NYHA Class: II (slight limitation of activity) -Current treatment: Furosemide 20 mg daily Metoprolol succinate 25 mg daily Entresto 24-26 mg BID Spironolactone 25 mg - 1/2 tab daily Jardiance 10 mg daily -Educated on Benefits of medications for managing symptoms and prolonging life; Importance of weighing daily; Importance of blood pressure control -Recommended to continue current medication  COPD (Goal: control symptoms and prevent exacerbations) -Controlled - pt reports overall good control; she uses Proair ~once a day to "open up lungs" before bed; she wears Oxygen most of the time -Gold Grade: Gold 2 (FEV1 50-79%) -Current COPD Classification:  C (low sx, >/=2 exacerbations/yr) -MMRC/CAT score: not on file -Pulmonary function testing (09/2017): FEV1 50% predicted, FEV1/FVC 0.58 -Exacerbations requiring treatment in last 6 months: 0 -Current treatment  Breztri 160-9-4.8 mcg/act 2 puffs BID Proair Respiclick PRN - usually before bed Albuterol 0.083% nebulizer soln PRN - very infrequently Supplemental O2 -Patient reports consistent use of maintenance inhaler -Frequency of rescue inhaler use: daily or every other day -Counseled on Proper inhaler technique; Benefits of consistent maintenance inhaler use; When to use rescue inhaler -Recommended to continue current medication  Tobacco use (Goal: cessation) -Not ideally controlled - pt is down down to 3 cigarettes per day; she uses cigarette instead of lorazepam for anxiety -Previous quit attempts: has quit before, cold [place]; tried Chantix before -reports MOTIVATION to quit is high -reports CONFIDENCE in quitting is moderate -Counseled on benefits of NRT for cravings; advised to call [place] Quit Line for free gum/lozenges; pt is agreeable and says she will call  Depression/Anxiety (Goal: manage symptoms) -Controlled - pt reports mood is "stable"; she does report using cigarettes to help with anxiety more often than  lorazepam -Current treatment: Aripiprazole 5 mg daily Escitalopram 20 mg - 1.5 tab daily Lorazepam 0.5 mg - 1/2 to 1 tab BID prn - once a week -PHQ9: 1 -GAD7: 1 -Connected with PCP for mental health support -Educated on Benefits of medication for symptom control -Advised it is ok to use lorazepam PRN instead of cigarettes -Recommended to continue current medication  Allergic rhinitis (Goal: manage symptoms) -Controlled - per pt she takes cetirizine and flonase daily year round and symptoms are relatively controlled -Current treatment  Cetirizine 10 mg daily Fluticasone nasal spray BID Mucinex DM prn Benzonatate 200 mg TID prn - not taken in months   -Recommended to continue current medication  Pain (Goal: manage symptoms) -Controlled - overall gabapentin helps; she reports occasionally feet burning, numbness for a few minutes; she denies sedation - Lumbar spinal stenosis with neurogenic claudication, lumbar radiculopathy, lumbar spondylolisthesis, adult degenerative scoliosis  -Current treatment  Gabapentin 300 mg - 2 tab AM, 3 tab PM -Counseled on potential for gabapentin to cause sedation, especially with lorazepam -Recommended to continue current medication  GERD (Goal: manage symptoms) -Controlled -Patient is satisfied with current regimen and denies issues -Current treatment  Famotidine 20 mg HS -Recommended to continue current medication  Health Maintenance -Current therapy:  Vitamin D 1000 IU daily  Multivitamin Vitamin B12 1000 mcg daily -Patient is satisfied with current therapy and denies issues -Recommended to continue current medication  Patient Goals/Self-Care Activities Patient will:  - take medications as prescribed focus on medication adherence by pill box check glucose 2-3x weekly, document, and provide at future appointments check blood pressure daily, document, and provide at future appointments weigh daily, and contact provider if weight gain of 2+  lbs overnight or 5+ lbs in a week        Brenda [name] was given information about Chronic Care Management services today including:  CCM service includes personalized support from designated clinical staff supervised by her physician, including individualized plan of care and coordination with other care providers 24/7 contact phone numbers for assistance for urgent and routine care needs. Standard insurance, coinsurance, copays and deductibles apply for chronic care management only during months in which we provide at least 20 minutes of these services. Most insurances cover these services at 100%, however patients may be responsible for any copay, coinsurance and/or deductible if applicable. This service may help you avoid the need for more expensive face-to-face services. Only one practitioner may furnish and bill the service in a calendar month. The patient may stop CCM services at any time (effective at the end of the month) by phone call to the office staff.  Patient agreed to services and verbal consent obtained.   Patient verbalizes understanding of instructions provided today and agrees to view in MyChart.  Telephone follow up appointment with pharmacy team member scheduled for: 6 months  Brenda [name], PharmD, [place], CPP Clinical Pharmacist [facility] [phone]

## 2021-02-07 NOTE — Telephone Encounter (Signed)
See recent CCM note - ok to try off metformin.  Plz notify pt ok to hold metformin.

## 2021-02-07 NOTE — Telephone Encounter (Signed)
Lvm asking pt to call back.  Need to relay Dr. G's message.  

## 2021-02-07 NOTE — Progress Notes (Signed)
Chronic Care Management Pharmacy Note  02/07/2021 Name:  Brenda [name] MRN:  [id number] DOB:  [birth year]  Summary: -Recent A1c 5.8% (10/2020) and has been < 6.5% since 04/2019. Patient would like to stop metformin (500 mg/day) -Pt is down to 3 cigarettes per day - she uses mainly to help with anxiety  Recommendations/Changes made from today's visit: -It would be reasonable to stop metformin given excellent DM control sustained over last 2 years - will consult with PCP. -Advised pt to contact [place] [facility]uit [facility] for free nicotine gum/lozenges   Subjective: Brenda [name] is an 60 y.o. year old female who is a primary patient of Ria Bush, MD.  The CCM team was consulted for assistance with disease management and care coordination needs.    Engaged with patient by telephone for initial visit in response to provider referral for pharmacy case management and/or care coordination services.   Consent to Services:  The patient was given the following information about Chronic Care Management services today, agreed to services, and gave verbal consent: 1. CCM service includes personalized support from designated clinical staff supervised by the primary care provider, including individualized plan of care and coordination with other care providers 2. 24/7 contact phone numbers for assistance for urgent and routine care needs. 3. Service will only be billed when office clinical staff spend 20 minutes or more in a month to coordinate care. 4. Only one practitioner may furnish and bill the service in a calendar month. 5.The patient may stop CCM services at any time (effective at the end of the month) by phone call to the office staff. 6. The patient will be responsible for cost sharing (co-pay) of up to 20% of the service fee (after annual deductible is met). Patient agreed to services and consent obtained.  Patient Care Team: Ria Bush, MD as PCP - General O'Neal, Cassie Freer, MD as PCP -  Cardiology (Internal Medicine) Charlton Haws, [facility] as Pharmacist (Pharmacist)  [place]. She is currently staying with her 3 yo father as her mother passed in June. She has been on disability since 2019 for COPD.  Recent office visits: 10/01/20 Dr Danise Mina OV: AWV - restart atorvastatin, start Vitamin D 1000 IU daily (Vit D level 26).  Recent consult visits: 01/07/21 Geralynn Rile, MD-Cardiology (Coronary artery disease involving native coronary artery of native heart without angina pectoris) No med changes. Repeat ECHO 3 months.  12/31/20 Newt Minion, MD-Orthopedic Surgery (Ankle fracture, left, closed) ordered: XR Ankle Complete Left, No med changes   12/03/20 Newt Minion, MD-Orthopedic Surgery (Pain in left ankle and joints of left foot)   11/14/20 Persons, Bevely Palmer, PA-Orthopedic Surgery (Pain in left ankle and joints of left foot)   10/31/20 Newt Minion, MD-Orthopedic Surgery (Pain in left ankle and joints of left foot) Meds ordered:oxyCODONE-acetaminophen    10/08/20 NP Frann Rider (neurology): f/u TIA (Sept 2021); no med changes.  09/13/20 O'NealCassie Freer, MD-Cardiology (Chronic systolic heart failure) No med changes  Hospital visits: Medication Reconciliation was completed by comparing discharge summary, patient's EMR and Pharmacy list, and upon discussion with patient.  Admitted to the hospital on 11/02/20 due to STEMI. Discharge date was 11/04/20. Discharged from [place]?Medications Started at [facility] Discharge:?? -started Brilinta, Nitroglycerin due to STEMI  Medication Changes at Hospital Discharge: -Changed atorvastatin from 40 to 80 mg  Medications that remain the same  after Hospital Discharge:??  -All other medications will remain the same.    Admitted to the ED on 10/20/20 due to Ankle Fracture. Discharge date was 10/20/20. Discharged from [place]?Medications Started at [facility] Discharge:?? -started Oxydodone 5 mg due to pain  Medications that remain the same after Hospital Discharge:??  -All other medications will remain the same.     Objective:  Lab Results  Component Value Date   CREATININE 0.92 11/03/2020   BUN 13 11/03/2020   GFR 60.51 09/24/2020   GFRNONAA >60 11/03/2020   GFRAA 65 06/10/2020   NA 139 11/03/2020   K 3.8 11/03/2020   CALCIUM 9.2 11/03/2020   CO2 27 11/03/2020   GLUCOSE 101 (H) 11/03/2020    Lab Results  Component Value Date/Time   HGBA1C 5.8 (H) 11/02/2020 02:23 PM   HGBA1C 5.8 09/24/2020 08:43 AM   GFR 60.51 09/24/2020 08:43 AM   GFR 47.49 (L) 07/31/2019 08:08 AM   MICROALBUR <0.7 07/31/2019 08:08 AM   MICROALBUR 1.7 07/22/2018 10:46 AM    Last diabetic Eye exam: No results found for: HMDIABEYEEXA  Last diabetic Foot exam: No results found for: HMDIABFOOTEX   Lab Results  Component Value Date   CHOL 118 11/02/2020   HDL 42 11/02/2020   LDLCALC 49 11/02/2020   TRIG 135 11/02/2020   CHOLHDL 2.8 11/02/2020    Hepatic Function Latest Ref Rng & Units 11/02/2020 09/24/2020 02/07/2020  Total Protein 6.5 - 8.1 g/dL 7.8 6.8 7.0  Albumin 3.5 - 5.0 g/dL 3.6 3.6 3.5  AST 15 - 41 U/L _0 ALT 0 - 44 U/L 15 11 126(H)  Alk Phosphatase 38 - 126 U/L 101 113 88  Total Bilirubin 0.3 - 1.2 mg/dL 0.6 0.3 0.5  Bilirubin, Direct 0.0 - 0.2 mg/dL - - -    Lab Results  Component Value Date/Time   TSH 0.821 02/08/2020 08:31 AM   TSH 2.27 07/24/2016 04:54 PM   TSH 2.93 09/11/2014 08:47 AM    CBC Latest Ref Rng & Units 11/03/2020 11/02/2020 11/02/2020  WBC 4.0 - 10.5 K/uL 14.1(H) - 18.5(H)  Hemoglobin 12.0 - 15.0 g/dL 13.3 13.3 13.9  Hematocrit 36.0 - 46.0 % 41.6 39.0 44.1  Platelets 150 - 400 K/uL 312 - 369    Lab Results  Component Value Date/Time   VD25OH 26.05 (L) 09/24/2020 08:43 AM    Clinical ASCVD: Yes  The ASCVD Risk score (Arnett DK, et al., 2019) failed to calculate for the  following reasons:   The patient has a prior MI or stroke diagnosis    Depression screen [facility] 2/9 10/01/2020 02/23/2020 08/07/2019  Decreased Interest 0 3 2  Down, Depressed, Hopeless 0 3 2  PHQ - 2 Score 0 6 4  Altered sleeping 0 2 1  Tired, decreased energy _1 Change in appetite 0 1 2  Feeling bad or failure about yourself  0 3 1  Trouble concentrating 0 2 3  Moving slowly or fidgety/restless 0 3 3  Suicidal thoughts 0 0 0  PHQ-9 Score _2 Some recent data might be hidden    GAD 7 : Generalized Anxiety Score 10/01/2020 02/23/2020 08/07/2019 11/28/2018  Nervous, Anxious, on Edge _3 Control/stop worrying 0 _4 Worry too much - different things 0 _5 Trouble relaxing 0 _6 Restless 0 _7 Easily annoyed or  irritable 0 _0 Afraid - awful might happen 0 _1 Total GAD 7 Score _2 Social History   Tobacco Use  Smoking Status Every Day   Packs/day: 0.25   Years: 41.00   Pack years: 10.25   Types: Cigarettes  Smokeless Tobacco Never   BP Readings from Last 3 Encounters:  01/07/21 (!) 106/58  11/04/20 131/75  10/20/20 134/80   Pulse Readings from Last 3 Encounters:  01/07/21 64  11/04/20 69  10/20/20 80   Wt Readings from Last 3 Encounters:  01/07/21 231 lb 6.4 oz (105 kg)  10/20/20 225 lb (102.1 kg)  10/08/20 235 lb (106.6 kg)   BMI Readings from Last 3 Encounters:  01/07/21 43.72 kg/m  10/20/20 43.94 kg/m  10/08/20 45.90 kg/m    Assessment/Interventions: Review of patient past medical history, allergies, medications, health status, including review of consultants reports, laboratory and other test data, was performed as part of comprehensive evaluation and provision of chronic care management services.   SDOH:  (Social Determinants of Health) assessments and interventions performed: Yes SDOH Interventions    Flowsheet Row Most Recent Value  SDOH Interventions   Financial Strain Interventions Intervention Not Indicated       SDOH Screenings   Alcohol Screen: Not on file  Depression (PHQ2-9): Low Risk    PHQ-2 Score: 1  Financial Resource Strain: Low Risk    Difficulty of Paying Living Expenses: Not very hard  Food Insecurity: Not on file  Housing: Not on file  Physical Activity: Not on file  Social Connections: Not on file  Stress: Not on file  Tobacco Use: High Risk   Smoking Tobacco Use: Every Day   Smokeless Tobacco Use: Never  Transportation Needs: Not on file    [place]  Allergies  Allergen Reactions   Metolazone Other (See Comments)    Metabolic mood swings    Medications Reviewed Today     Reviewed by Charlton Haws, [facility] (Pharmacist) on 02/07/21 at [place] List Status: <None>   Medication Order Taking? Sig Documenting Provider Last Dose Status Informant  albuterol (PROVENTIL) (2.5 MG/3ML) 0.083% nebulizer solution [id number] Yes INHALE 1 VIAL VIA NEBULIZER EVERY 6 HOURS AS NEEDED FOR WHEEZING/SHORTNESS OF Alvester Morin, MD Taking Active   ARIPiprazole (ABILIFY) 5 MG tablet [id number] Yes TAKE 1 TABLET BY MOUTH EVERY DAY  Patient taking differently: Take 5 mg by mouth daily.   Ria Bush, MD Taking Active   aspirin EC 81 MG EC tablet [id number] Yes Take 1 tablet (81 mg total) by mouth daily. Swallow whole. Mariel Aloe, MD Taking Active Self  atorvastatin (LIPITOR) 80 MG tablet [id number] Yes TAKE 1 TABLET BY MOUTH EVERYDAY AT BEDTIME Ria Bush, MD Taking Active   benzonatate (TESSALON) 200 MG capsule [id number] Yes Take 1 capsule (200 mg total) by mouth 3 (three) times daily as needed for cough. Swallow whole, to not bite pill Ria Bush, MD Taking Active Self  BREZTRI AEROSPHERE 160-9-4.8 MCG/ACT AERO [id number] Yes INHALE 2 PUFFS INTO LUNGS 2 TIMES DAILY  Patient taking differently: Inhale 2 puffs into the lungs in the morning and at bedtime.   Tanda Rockers, MD Taking Active   cetirizine (ZYRTEC) 10 MG tablet [id number] Yes Take 10 mg by  mouth at bedtime. [provider] Taking Active Self  Cholecalciferol (VITAMIN D3) 25 MCG (1000 UT) CAPS [id number] Yes Take 1 capsule (1,000 Units total) by  mouth daily. Ria Bush, MD Taking Active Self  dextromethorphan-guaiFENesin [facility] DM) 30-600 MG 12hr tablet [id number] Yes Take 1 tablet by mouth 2 (two) times daily as needed for cough. [provider] Taking Active Self  empagliflozin (JARDIANCE) 10 MG TABS tablet [id number] Yes Take 1 tablet (10 mg total) by mouth daily before breakfast. Geralynn Rile, MD Taking Active Self  escitalopram (LEXAPRO) 20 MG tablet [id number] Yes TAKE 1 AND 1/2 TABLETS DAILY BY MOUTH Ria Bush, MD Taking Active   famotidine (PEPCID) 20 MG tablet [id number] Yes TAKE 1 TABLET BY MOUTH EVERYDAY AT BEDTIME  Patient taking differently: Take 20 mg by mouth at bedtime.   Tanda Rockers, MD Taking Active   fluticasone [facility]) 50 MCG/ACT nasal spray [id number] Yes PLACE 1 SPRAY INTO BOTH NOSTRILS 2 (TWO) TIMES DAILY. Tanda Rockers, MD Taking Active Self  furosemide (LASIX) 20 MG tablet [id number] Yes TAKE 1 TABLET BY MOUTH EVERY DAY  Patient taking differently: Take 20 mg by mouth daily.   Geralynn Rile, MD Taking Active   gabapentin (NEURONTIN) 300 MG capsule [id number] Yes TAKE 2 TABLETS IN THE MORNING AND 3 TABLETS AT NIGHT  Patient taking differently: Take 300 mg by mouth See admin instructions. TAKE 2 TABLETS IN THE MORNING AND 3 TABLETS AT Georga Hacking, MD Taking Active   LORazepam (ATIVAN) 0.5 MG tablet [id number] Yes TAKE 1/2 TO 1 TABLET BY MOUTH TWICE A DAY AS NEEDED FOR ANXIETY Ria Bush, MD Taking Active   metFORMIN (GLUCOPHAGE) 500 MG tablet [id number] Yes TAKE 1 TABLET BY MOUTH EVERYDAY AT BEDTIME  Patient taking differently: Take 500 mg by mouth daily with breakfast.   Ria Bush, MD Taking Active Self  metoprolol succinate (TOPROL-XL) 25 MG 24 hr tablet [id number] Yes Take 1 tablet  (25 mg total) by mouth daily.  Patient taking differently: Take 25 mg by mouth in the morning.   Geralynn Rile, MD Taking Active Self  Multiple Vitamin (MULTIVITAMIN WITH MINERALS) TABS tablet [id number] Yes Take 1 tablet by mouth daily. [provider] Taking Active Self  nitroGLYCERIN (NITROSTAT) 0.4 MG SL tablet [id number] Yes Place 1 tablet (0.4 mg total) under the tongue every 5 (five) minutes as needed. Cheryln Manly, NP Taking Active            Med Note Orma Render   Tue Jan 07, 2021  2:09 PM) Patient has if needed  nystatin (MYCOSTATIN) 100000 UNIT/ML suspension [id number] Yes TAKE 1 TEASPOONFUL BY MOUTH 3 TIMES DAILY AS NEEDED FOR MOUTH SORES. Melvenia Needles, NP Taking Active   PROAIR RESPICLICK 277 (90 Base) MCG/ACT AEPB [id number] Yes INHALE 2 PUFFS INTO THE LUNGS EVERY 6 HOURS AS NEEDED FOR DYSPNEA/WHEEZE Ria Bush, MD Taking Active   sacubitril-valsartan [facility]) 24-26 MG [id number] Yes Take 1 tablet by mouth 2 (two) times daily. Geralynn Rile, MD Taking Active Self  spironolactone (ALDACTONE) 25 MG tablet [id number] Yes Take 12.5 mg by mouth daily. [provider] Taking Active Self  ticagrelor (BRILINTA) 90 MG TABS tablet [id number] Yes Take 1 tablet (90 mg total) by mouth 2 (two) times daily. Cheryln Manly, NP Taking Active             Patient Active Problem List   Diagnosis Date Noted   STEMI (ST elevation myocardial infarction) ([place]) 11/02/2020   STEMI involving right coronary artery ([place]) 11/02/2020   Medicare annual wellness visit, initial 10/01/2020   Cannabis dependence ([place]) 04/30/2020  Basilar artery stenosis 02/18/2020   TIA (transient ischemic attack) 02/08/2020   Chronic combined systolic and diastolic CHF (congestive heart failure) ([place]) 02/08/2020   Elevated troponin 02/04/2020   Chronic kidney disease, stage 3a ([place]) 08/07/2019   Knee mass, right 11/28/2018   Advanced care planning/counseling  discussion 07/27/2018   Unilateral primary osteoarthritis, left knee 10/28/2017   COPD  GOLD II/III still smoking/ 02 dep  10/20/2017   Cor pulmonale (chronic) (HCC) 27/10/2374   Diastolic dysfunction 28/31/5176   Grief 02/24/2017   Chronic respiratory failure with hypoxia ([place]) 07/31/2015   Type 2 diabetes, controlled, with peripheral neuropathy ([place])    COPD exacerbation ([place]) 09/14/2014   OSA on CPAP 09/14/2014   Health maintenance examination 09/14/2014   Upper airway cough syndrome 09/22/2013   Chronic pain syndrome 05/30/2013   Migraine, unspecified, without mention of intractable migraine without mention of status migrainosus 05/30/2013   Pyelonephritis 05/05/2012   Asthmatic bronchitis 02/27/2008   Leukocytosis 02/22/2008   NONSPECIFIC ABNORMAL ELECTROCARDIOGRAM 02/22/2008   HYPERTENSION, BENIGN ESSENTIAL 06/23/2007   HYPERTRIGLYCERIDEMIA 06/01/2007   Morbid obesity with BMI of 40.0-44.9, adult ([place]) 06/01/2007   Mood disorder ([place]) 06/01/2007   Cigarette smoker 06/01/2007   Left leg swelling 05/04/2007    Immunization History  Administered Date(s) Administered   Influenza Whole 04/01/2002   Influenza,inj,Quad PF,6+ Mos 03/21/2015, 07/27/2018, 02/08/2019, 02/23/2020   Influenza-Unspecified 03/01/2014, 04/22/2016, 03/25/2017   PFIZER(Purple Top)SARS-COV-2 Vaccination 02/09/2020, 03/06/2020   PNEUMOCOCCAL CONJUGATE-20 10/01/2020   Pneumococcal Polysaccharide-23 04/01/2002, 07/24/2015   Td 06/01/1993   Tdap 02/24/2017    Conditions to be addressed/monitored:  Hypertension, Hyperlipidemia, Diabetes, Heart Failure, Coronary Artery Disease, GERD, COPD, Depression, Anxiety, and Tobacco use  Care Plan : [place]  Updates made by Charlton Haws, [place] since 02/07/2021 12:00 AM     Problem: Hypertension, Hyperlipidemia, Diabetes, Heart Failure, Coronary Artery Disease, GERD, COPD, Depression, Anxiety, and Tobacco use      Long-Range Goal: Disease  management   Start Date: 02/07/2021  Expected End Date: 02/07/2022  This Visit's Progress: On track  Priority: High  Note:   Current Barriers:  Unable to independently monitor therapeutic efficacy Unable to achieve control of smoking   Pharmacist Clinical Goal(s):  Patient will achieve adherence to monitoring guidelines and medication adherence to achieve therapeutic efficacy achieve control of smoking as evidenced by pt report through collaboration with PharmD and provider.   Interventions: 1:1 collaboration with Ria Bush, MD regarding development and update of comprehensive plan of care as evidenced by provider attestation and co-signature Inter-disciplinary care team collaboration (see longitudinal plan of care) Comprehensive medication review performed; medication list updated in electronic medical record  Hyperlipidemia: (LDL goal < 70) -Controlled - LDL was 98 in 08/2020 after being off statin for a few weeks, 49 in 10/2020 s/p STEMI and restarting atorvastatin 40 mg; pt endorses compliance with higher dose statin and denies side effects; she denies bleeding issues on DAPT -Hx STEMI 10/2020; hx TIA 01/2020 -Current treatment: Atorvastatin 80 mg daily Nitroglycerin 0.4 mg SL prn Brilinta 90 mg BID Aspirin 81 mg daily -Current dietary patterns: trying to cut back on portion size -Current exercise habits: trying to walk more; limited by COPD/need for O2 -Educated on Cholesterol goals; Benefits of statin for ASCVD risk reduction; -Recommended to continue current medication  Diabetes (A1c goal <7%) -Controlled - A1c most recently 5.8% (10/2020); A1c has been <6.5% since 04/2019; pt questions need for metformin and is concerned about metformin side effects - she  read it can increase risk for dementia -Current home glucose readings fasting glucose: 90-110 -Current medications: Metformin 500 mg daily Jardiance 10 mg daily -Educated on A1c and blood sugar goals; Complications of  diabetes including kidney damage, retinal damage, and cardiovascular disease; -Counseled on benefits of metformin, dementia risk - many studies have shown metformin actually has a protective effect against dementia; diabetes itself has been linked to dementia as well -Given A1c in prediabetic range consistently over 2 years, overall low metformin dose, and concomitant SGLT-2 inhibitor, it would be reasonable to d/c metformin. Will discuss with PCP  Heart Failure / Hypertension (BP goal < 130/80, prevent exacerbations) -Controlled - BP is at goal and pt denies persistent swelling; she endorses compliance -Current home BP/HR readings: 116/78 -Last ejection fraction: 45-50% (Date: 11/03/20) -HF type: Combined Systolic and Diastolic -NYHA Class: II (slight limitation of activity) -Current treatment: Furosemide 20 mg daily Metoprolol succinate 25 mg daily Entresto 24-26 mg BID Spironolactone 25 mg - 1/2 tab daily Jardiance 10 mg daily -Educated on Benefits of medications for managing symptoms and prolonging life; Importance of weighing daily; Importance of blood pressure control -Recommended to continue current medication  COPD (Goal: control symptoms and prevent exacerbations) -Controlled - pt reports overall good control; she uses Proair ~once a day to "open up lungs" before bed; she wears Oxygen most of the time -Gold Grade: Gold 2 (FEV1 50-79%) -Current COPD Classification:  C (low sx, >/=2 exacerbations/yr) -MMRC/CAT score: not on file -Pulmonary function testing (09/2017): FEV1 50% predicted, FEV1/FVC 0.58 -Exacerbations requiring treatment in last 6 months: 0 -Current treatment  Breztri 160-9-4.8 mcg/act 2 puffs BID Proair Respiclick PRN - usually before bed Albuterol 0.083% nebulizer soln PRN - very infrequently Supplemental O2 -Patient reports consistent use of maintenance inhaler -Frequency of rescue inhaler use: daily or every other day -Counseled on Proper inhaler technique;  Benefits of consistent maintenance inhaler use; When to use rescue inhaler -Recommended to continue current medication  Tobacco use (Goal: cessation) -Not ideally controlled - pt is down down to 3 cigarettes per day; she uses cigarette instead of lorazepam for anxiety -Previous quit attempts: has quit before, cold [place]; tried Chantix before -reports MOTIVATION to quit is high -reports CONFIDENCE in quitting is moderate -Counseled on benefits of NRT for cravings; advised to call [place] [facility]uit Line for free gum/lozenges; pt is agreeable and says she will call  Depression/Anxiety (Goal: manage symptoms) -Controlled - pt reports mood is "stable"; she does report using cigarettes to help with anxiety more often than lorazepam -Current treatment: Aripiprazole 5 mg daily Escitalopram 20 mg - 1.5 tab daily Lorazepam 0.5 mg - 1/2 to 1 tab BID prn - once a week -PHQ9: 1 -GAD7: 1 -Connected with PCP for mental health support -Educated on Benefits of medication for symptom control -Advised it is ok to use lorazepam PRN instead of cigarettes -Recommended to continue current medication  Allergic rhinitis (Goal: manage symptoms) -Controlled - per pt she takes cetirizine and flonase daily year round and symptoms are relatively controlled -Current treatment  Cetirizine 10 mg daily Fluticasone nasal spray BID Mucinex DM prn Benzonatate 200 mg TID prn - not taken in months   -Recommended to continue current medication  Pain (Goal: manage symptoms) -Controlled - overall gabapentin helps; she reports occasionally feet burning, numbness for a few minutes; she denies sedation - Lumbar spinal stenosis with neurogenic claudication, lumbar radiculopathy, lumbar spondylolisthesis, adult degenerative scoliosis  -Current treatment  Gabapentin 300 mg - 2 tab AM, 3 tab  PM -Counseled on potential for gabapentin to cause sedation, especially with lorazepam -Recommended to continue current medication  GERD (Goal:  manage symptoms) -Controlled -Patient is satisfied with current regimen and denies issues -Current treatment  Famotidine 20 mg HS -Recommended to continue current medication  Health Maintenance -Current therapy:  Vitamin D 1000 IU daily Multivitamin Vitamin B12 1000 mcg daily -Patient is satisfied with current therapy and denies issues -Recommended to continue current medication  Patient Goals/Self-Care Activities Patient will:  - take medications as prescribed focus on medication adherence by pill box check glucose 2-3x weekly, document, and provide at future appointments check blood pressure daily, document, and provide at future appointments weigh daily, and contact provider if weight gain of 2+ lbs overnight or 5+ lbs in a week        Medication Assistance: None required.  Patient affirms current coverage meets needs. [facility] Dual complete, all meds $0)  Compliance/Adherence/Medication fill history: Care Gaps: Eye exam Colonoscopy Foot exam Vaccines - Shingrix, Prevnar, covid booster, influenza --advised to get overdue vaccines at local pharmacy  Star-Rating Drugs: Atorvatatin - LF 01/29/21 x 90 ds Jardiance - LF 12/10/20 x 90 ds Entresto - LF 11/15/20 x 90 ds Metformin - LF 01/02/21 x 90 ds  Patient's preferred pharmacy is:  [id number] [place] [address]Phone: [phone]: [phone] Uses pill box? Yes Pt endorses 100% compliance  We discussed: Benefits of medication synchronization, packaging and delivery as well as enhanced pharmacist oversight with [employer]. Patient decided to: Continue current medication management strategy  Care Plan and Follow Up Patient Decision:  Patient agrees to Care Plan and Follow-up.  Plan: Telephone follow up appointment with care management team member scheduled for:  6 months  LCharlene Brooke PharmD, [place] CPP Clinical  Pharmacist [facility] [phone]

## 2021-02-10 NOTE — Telephone Encounter (Signed)
Spoke with pt relaying Dr. G's message. Pt verbalizes understanding.  

## 2021-02-12 ENCOUNTER — Telehealth (HOSPITAL_COMMUNITY): Payer: Self-pay

## 2021-02-12 NOTE — Telephone Encounter (Signed)
Called to schedule cta head/neck, no answer, left vm. AW 

## 2021-02-13 ENCOUNTER — Other Ambulatory Visit (HOSPITAL_COMMUNITY): Payer: Self-pay | Admitting: Interventional Radiology

## 2021-02-13 DIAGNOSIS — I771 Stricture of artery: Secondary | ICD-10-CM

## 2021-02-16 ENCOUNTER — Other Ambulatory Visit: Payer: Self-pay | Admitting: Cardiovascular Disease

## 2021-02-17 ENCOUNTER — Other Ambulatory Visit: Payer: Self-pay | Admitting: Family Medicine

## 2021-02-17 NOTE — Telephone Encounter (Signed)
Refill request Lorazepam Last refill 01/02/21 #30 Last office visit 10/01/20 Upcoming appointment 04/04/21

## 2021-02-18 NOTE — Telephone Encounter (Signed)
ERx 

## 2021-02-25 ENCOUNTER — Encounter (HOSPITAL_COMMUNITY): Payer: Self-pay

## 2021-02-25 ENCOUNTER — Ambulatory Visit (HOSPITAL_COMMUNITY)
Admission: RE | Admit: 2021-02-25 | Discharge: 2021-02-25 | Disposition: A | Discharge status: Home / Self Care | Payer: Medicare Other | Source: Ambulatory Visit | Attending: Interventional Radiology | Admitting: Interventional Radiology

## 2021-02-25 ENCOUNTER — Other Ambulatory Visit: Payer: Self-pay

## 2021-02-25 DIAGNOSIS — I6502 Occlusion and stenosis of left vertebral artery: Secondary | ICD-10-CM | POA: Diagnosis not present

## 2021-02-25 DIAGNOSIS — I771 Stricture of artery: Secondary | ICD-10-CM | POA: Insufficient documentation

## 2021-02-25 DIAGNOSIS — I6523 Occlusion and stenosis of bilateral carotid arteries: Secondary | ICD-10-CM | POA: Diagnosis not present

## 2021-02-25 DIAGNOSIS — I672 Cerebral atherosclerosis: Secondary | ICD-10-CM | POA: Diagnosis not present

## 2021-02-25 LAB — POCT I-STAT CREATININE: Creatinine, Ser: 1 mg/dL (ref 0.44–1.00)

## 2021-02-25 MED ORDER — IOHEXOL 350 MG/ML SOLN
100.0000 mL | Freq: Once | INTRAVENOUS | Status: AC | PRN
  Administered 2021-02-25: 100 mL via INTRAVENOUS

## 2021-02-26 DIAGNOSIS — G4733 Obstructive sleep apnea (adult) (pediatric): Secondary | ICD-10-CM | POA: Diagnosis not present

## 2021-03-03 ENCOUNTER — Ambulatory Visit (INDEPENDENT_AMBULATORY_CARE_PROVIDER_SITE_OTHER): Payer: Medicare Other

## 2021-03-03 ENCOUNTER — Telehealth (HOSPITAL_COMMUNITY): Payer: Self-pay

## 2021-03-03 DIAGNOSIS — G459 Transient cerebral ischemic attack, unspecified: Secondary | ICD-10-CM

## 2021-03-03 NOTE — Telephone Encounter (Signed)
Pt agreed to f/u in 1 year with cta head/neck. AW  

## 2021-03-05 LAB — CUP PACEART REMOTE DEVICE CHECK
Date Time Interrogation Session: 20220928170845
Implantable Pulse Generator Implant Date: 20211102

## 2021-03-07 DIAGNOSIS — G4733 Obstructive sleep apnea (adult) (pediatric): Secondary | ICD-10-CM | POA: Diagnosis not present

## 2021-03-10 NOTE — Progress Notes (Signed)
Carelink Summary Report / Loop Recorder 

## 2021-03-20 ENCOUNTER — Other Ambulatory Visit: Payer: Self-pay | Admitting: Family Medicine

## 2021-03-21 NOTE — Telephone Encounter (Signed)
Abilify last filled:  11/22/20, #90 Gabapentin last filled:  01/02/21, #450 Last OV:  10/01/20, AWV Next OV:  04/04/21, 6 mo f/u

## 2021-03-27 ENCOUNTER — Other Ambulatory Visit: Payer: Self-pay | Admitting: Family Medicine

## 2021-04-01 LAB — CUP PACEART REMOTE DEVICE CHECK
Date Time Interrogation Session: 20221031170151
Implantable Pulse Generator Implant Date: 20211102

## 2021-04-02 ENCOUNTER — Other Ambulatory Visit: Payer: Self-pay | Admitting: Family Medicine

## 2021-04-04 ENCOUNTER — Encounter: Payer: Self-pay | Admitting: Family Medicine

## 2021-04-04 ENCOUNTER — Other Ambulatory Visit: Payer: Self-pay

## 2021-04-04 ENCOUNTER — Ambulatory Visit (INDEPENDENT_AMBULATORY_CARE_PROVIDER_SITE_OTHER): Payer: Medicare Other | Admitting: Family Medicine

## 2021-04-04 VITALS — BP 124/80 | HR 68 | Temp 97.5°F | Ht 61.0 in | Wt 228.2 lb

## 2021-04-04 DIAGNOSIS — I2111 ST elevation (STEMI) myocardial infarction involving right coronary artery: Secondary | ICD-10-CM

## 2021-04-04 DIAGNOSIS — I2781 Cor pulmonale (chronic): Secondary | ICD-10-CM | POA: Diagnosis not present

## 2021-04-04 DIAGNOSIS — I651 Occlusion and stenosis of basilar artery: Secondary | ICD-10-CM

## 2021-04-04 DIAGNOSIS — E1142 Type 2 diabetes mellitus with diabetic polyneuropathy: Secondary | ICD-10-CM | POA: Diagnosis not present

## 2021-04-04 DIAGNOSIS — F17210 Nicotine dependence, cigarettes, uncomplicated: Secondary | ICD-10-CM

## 2021-04-04 DIAGNOSIS — F39 Unspecified mood [affective] disorder: Secondary | ICD-10-CM

## 2021-04-04 DIAGNOSIS — Z23 Encounter for immunization: Secondary | ICD-10-CM | POA: Diagnosis not present

## 2021-04-04 DIAGNOSIS — J4530 Mild persistent asthma, uncomplicated: Secondary | ICD-10-CM

## 2021-04-04 DIAGNOSIS — J9611 Chronic respiratory failure with hypoxia: Secondary | ICD-10-CM | POA: Diagnosis not present

## 2021-04-04 DIAGNOSIS — I5042 Chronic combined systolic (congestive) and diastolic (congestive) heart failure: Secondary | ICD-10-CM | POA: Diagnosis not present

## 2021-04-04 DIAGNOSIS — Z6841 Body Mass Index (BMI) 40.0 and over, adult: Secondary | ICD-10-CM

## 2021-04-04 DIAGNOSIS — I10 Essential (primary) hypertension: Secondary | ICD-10-CM | POA: Diagnosis not present

## 2021-04-04 DIAGNOSIS — J449 Chronic obstructive pulmonary disease, unspecified: Secondary | ICD-10-CM

## 2021-04-04 LAB — POCT GLYCOSYLATED HEMOGLOBIN (HGB A1C): Hemoglobin A1C: 6.3 % — AB (ref 4.0–5.6)

## 2021-04-04 NOTE — Assessment & Plan Note (Signed)
Appreciate cards care on entresto, spironolactone, metoprolol.

## 2021-04-04 NOTE — Assessment & Plan Note (Signed)
Currently on brillinta and aspirin for a year then plan to continue aspirin monotherapy

## 2021-04-04 NOTE — Assessment & Plan Note (Signed)
Overall very stable period on current regimen of abilify 5mg  nd lexapro 30mg  daily with PRN lorazepam - uses mainly at night time.  Suggested she try 1/2 lorazepam each use, and drop lexapro to 20mg  daily. Update with effect.

## 2021-04-04 NOTE — Progress Notes (Signed)
Patient ID: Brenda [name], female    DOB: 1961-05-24, 60 y.o.   MRN: [id number]  This visit was conducted in person.  BP 124/80   Pulse 68   Temp (!) 97.5 F (36.4 C) (Temporal)   Ht 5\' 1"  (1.549 m)   Wt 228 lb 4 oz (103.5 kg)   LMP 03/08/2013   SpO2 97% Comment: 2 L  BMI 43.13 kg/m    CC: 6 mo f/u visit  Subjective:   HPI: Brenda [name] is a 60 y.o. female presenting on 04/04/2021 for Follow-up (Here for 6 mo f/u.)   Lives with father.  Lost her mother in 10/2020 due to CHF.   Suffered inferior STEMI 10/2020 s/p PCI to distal RCA, plan aspirin + Brilinta for 1 year then continue aspirin alone. Also on entresto, metorpolol, lasix, aldactone and jardiance 10mg  daily.   Suffered L ankle fracture 09/2020 s/p conservative management.   Suffered TIA 01/2020 due to embolism of distal basilar artery of cryptogenic etiology. Continues aspirin and atorvastatin 80mg  daily.   Metformin stopped 01/2021 due to excellent ongoing diabetic control.  Lab Results  Component Value Date   HGBA1C 6.3 (A) 04/04/2021     COPD on O2 at [facility] - continues breztri with albuterol PRN. Denies significant dyspnea or cough. She did have to use albuterol neb yesterday due to some wheezing.   Smoking - down to 2 cig/day. Previously on chantix without much benefit. Information has previously been provided for [place] quitline.   Worsening mood - abilify started 01/2020 in addition to chronic lexapro 20mg , referred to psychiatry in h/o bipolar diagnosis. She never kept psych appointment. Takes lorazepam PRN at night - this does cause sedation.      Relevant past medical, surgical, family and social history reviewed and updated as indicated. Interim medical history since our last visit reviewed. Allergies and medications reviewed and updated. Outpatient Medications Prior to Visit  Medication Sig Dispense Refill   albuterol (PROVENTIL) (2.5 MG/3ML) 0.083% nebulizer solution INHALE 1 VIAL VIA NEBULIZER EVERY 6 HOURS  AS NEEDED FOR WHEEZING/SHORTNESS OF BREATH 150 mL 1   ARIPiprazole (ABILIFY) 5 MG tablet TAKE 1 TABLET BY MOUTH EVERY DAY 90 tablet 1   aspirin EC 81 MG EC tablet Take 1 tablet (81 mg total) by mouth daily. Swallow whole. 30 tablet 0   atorvastatin (LIPITOR) 80 MG tablet TAKE 1 TABLET BY MOUTH EVERYDAY AT BEDTIME 90 tablet 0   benzonatate (TESSALON) 200 MG capsule Take 1 capsule (200 mg total) by mouth 3 (three) times daily as needed for cough. Swallow whole, to not bite pill 45 capsule 2   BREZTRI AEROSPHERE 160-9-4.8 MCG/ACT AERO INHALE 2 PUFFS INTO LUNGS 2 TIMES DAILY (Patient taking differently: Inhale 2 puffs into the lungs in the morning and at bedtime.) 10.7 g 11   cetirizine (ZYRTEC) 10 MG tablet Take 10 mg by mouth at bedtime.     Cholecalciferol (VITAMIN D3) 25 MCG (1000 UT) CAPS Take 1 capsule (1,000 Units total) by mouth daily. 30 capsule    dextromethorphan-guaiFENesin (MUCINEX DM) 30-600 MG 12hr tablet Take 1 tablet by mouth 2 (two) times daily as needed for cough.     empagliflozin (JARDIANCE) 10 MG TABS tablet Take 1 tablet (10 mg total) by mouth daily before breakfast. 90 tablet 3   ENTRESTO 24-26 MG TAKE 1 TABLET BY MOUTH TWICE A DAY 180 tablet 1   escitalopram (LEXAPRO) 20 MG tablet TAKE 1 AND 1/2 TABLETS DAILY BY  MOUTH 135 tablet 1   famotidine (PEPCID) 20 MG tablet TAKE 1 TABLET BY MOUTH EVERYDAY AT BEDTIME (Patient taking differently: Take 20 mg by mouth at bedtime.) 90 tablet 0   fluticasone (FLONASE) 50 MCG/ACT nasal spray PLACE 1 SPRAY INTO BOTH NOSTRILS 2 (TWO) TIMES DAILY. 48 mL 3   furosemide (LASIX) 20 MG tablet TAKE 1 TABLET BY MOUTH EVERY DAY (Patient taking differently: Take 20 mg by mouth daily.) 90 tablet 3   gabapentin (NEURONTIN) 300 MG capsule TAKE 2 CAPSULES IN THE MORNING AND 3 CAPSULES AT NIGHT 450 capsule 1   LORazepam (ATIVAN) 0.5 MG tablet TAKE 1/2 TO 1 TABLET BY MOUTH TWICE A DAY AS NEEDED FOR ANXIETY 30 tablet 1   metFORMIN (GLUCOPHAGE) 500 MG tablet TAKE  1 TABLET BY MOUTH EVERYDAY AT BEDTIME 90 tablet 1   metoprolol succinate (TOPROL-XL) 25 MG 24 hr tablet TAKE 1 TABLET (25 MG TOTAL) BY MOUTH DAILY. 90 tablet 1   Multiple Vitamin (MULTIVITAMIN WITH MINERALS) TABS tablet Take 1 tablet by mouth daily.     nitroGLYCERIN (NITROSTAT) 0.4 MG SL tablet Place 1 tablet (0.4 mg total) under the tongue every 5 (five) minutes as needed. 25 tablet 2   nystatin (MYCOSTATIN) 100000 UNIT/ML suspension TAKE 1 TEASPOONFUL BY MOUTH 3 TIMES DAILY AS NEEDED FOR MOUTH SORES. 150 mL 1   PROAIR RESPICLICK 123XX123 (90 Base) MCG/ACT AEPB INHALE 2 PUFFS INTO THE LUNGS EVERY 6 HOURS AS NEEDED FOR DYSPNEA/WHEEZE 1 each 1   spironolactone (ALDACTONE) 25 MG tablet Take 12.5 mg by mouth daily.     ticagrelor (BRILINTA) 90 MG TABS tablet Take 1 tablet (90 mg total) by mouth 2 (two) times daily. 180 tablet 2   No facility-administered medications prior to visit.     Per HPI unless specifically indicated in ROS section below Review of Systems  Objective:  BP 124/80   Pulse 68   Temp (!) 97.5 F (36.4 C) (Temporal)   Ht 5\' 1"  (1.549 m)   Wt 228 lb 4 oz (103.5 kg)   LMP 03/08/2013   SpO2 97% Comment: 2 L  BMI 43.13 kg/m   Wt Readings from Last 3 Encounters:  04/04/21 228 lb 4 oz (103.5 kg)  01/07/21 231 lb 6.4 oz (105 kg)  10/20/20 225 lb (102.1 kg)      Physical Exam Vitals and nursing note reviewed.  Constitutional:      Appearance: Normal appearance. She is not ill-appearing.     Comments: Supplemental O2 in place at [place]:     Head: Normocephalic and atraumatic.  Eyes:     Extraocular Movements: Extraocular movements intact.     Pupils: Pupils are equal, round, and reactive to light.  Cardiovascular:     Rate and Rhythm: Normal rate and regular rhythm.     Pulses: Normal pulses.     Heart sounds: Normal heart sounds. No murmur heard. Pulmonary:     Effort: Pulmonary effort is normal. No respiratory distress.     Breath sounds: Rhonchi (upper airway  transmission) present. No wheezing or rales.  Musculoskeletal:     Right lower leg: No edema.     Left lower leg: No edema.  Skin:    General: Skin is warm and dry.     Findings: No rash.  Neurological:     Mental Status: She is alert.  Psychiatric:        Mood and Affect: Mood normal.  Behavior: Behavior normal.       Assessment & Plan:  This visit occurred during the SARS-CoV-2 public health emergency.  Safety protocols were in place, including screening questions prior to the visit, additional usage of staff PPE, and extensive cleaning of exam room while observing appropriate contact time as indicated for disinfecting solutions.   Problem List Items Addressed This Visit     Morbid obesity with BMI of 40.0-44.9, adult ([place])   Mood disorder ([place])    Overall very stable period on current regimen of abilify 5mg  nd lexapro 30mg  daily with PRN lorazepam - uses mainly at night time.  Suggested she try 1/2 lorazepam each use, and drop lexapro to 20mg  daily. Update [place] with effect.       Cigarette smoker    Continued smoker - down to 2 cig/day.  Contemplative to action phase.       HYPERTENSION, BENIGN ESSENTIAL    Chronic, stable. Continue current regimen.       Asthmatic bronchitis    Has seen pulm. Continues supplemental oxygen as well as breztri daily.       Type 2 diabetes, controlled, with peripheral neuropathy ([place]) - Primary    Now off metformin, A1c remains well controlled. Only continues jardiance 10mg  daily.       Relevant Orders   POCT glycosylated hemoglobin (Hb A1C) (Completed)   Chronic respiratory failure with hypoxia (HCC)   Cor pulmonale (chronic) (HCC)   COPD  GOLD II/III still smoking/ 02 dep     Has seen pulm. Continues supplemental oxygen as well as breztri daily.       Chronic combined systolic and diastolic CHF (congestive heart failure) ([place])    Appreciate cards care on entresto, spironolactone, metoprolol.       Basilar artery stenosis     Continue high potency atorvastatin 80mg        STEMI involving right coronary artery (HCC)    Currently on brillinta and aspirin for a year then plan to continue aspirin monotherapy       Other Visit Diagnoses     Need for influenza vaccination       Relevant Orders   Flu Vaccine QUAD 15mo+IM (Fluarix, Fluzone & Alfiuria Quad PF) (Completed)        No orders of the defined types were placed in this encounter.  Orders Placed This Encounter  Procedures   Flu Vaccine QUAD 58mo+IM (Fluarix, Fluzone & Alfiuria Quad PF)   POCT glycosylated hemoglobin (Hb A1C)     Patient Instructions  Flu shot today  Let's try dropping dose of lexapro to 20mg  once daily. Let me know how you do with this.  Good to see you today  Return as needed or in 6 months for physical.   Follow up plan: Return in about 6 months (around 10/02/2021) for annual exam, prior fasting for blood work, medicare wellness visit.  Ria Bush, MD

## 2021-04-04 NOTE — Assessment & Plan Note (Signed)
Continued smoker - down to 2 cig/day.  Contemplative to action phase.

## 2021-04-04 NOTE — Assessment & Plan Note (Signed)
Chronic, stable. Continue current regimen. 

## 2021-04-04 NOTE — Assessment & Plan Note (Signed)
Now off metformin, A1c remains well controlled. Only continues jardiance 10mg  daily.

## 2021-04-04 NOTE — Assessment & Plan Note (Addendum)
Continue high potency atorvastatin 80mg 

## 2021-04-04 NOTE — Assessment & Plan Note (Signed)
Has seen pulm. Continues supplemental oxygen as well as breztri daily.

## 2021-04-04 NOTE — Patient Instructions (Addendum)
Flu shot today  Let's try dropping dose of lexapro to 20mg  once daily. Let me know how you do with this.  Good to see you today  Return as needed or in 6 months for physical.

## 2021-04-07 ENCOUNTER — Ambulatory Visit (INDEPENDENT_AMBULATORY_CARE_PROVIDER_SITE_OTHER): Payer: Medicare Other

## 2021-04-07 DIAGNOSIS — G459 Transient cerebral ischemic attack, unspecified: Secondary | ICD-10-CM

## 2021-04-07 DIAGNOSIS — G4733 Obstructive sleep apnea (adult) (pediatric): Secondary | ICD-10-CM | POA: Diagnosis not present

## 2021-04-09 NOTE — Progress Notes (Signed)
Carelink Summary Report / Loop Recorder 

## 2021-04-13 ENCOUNTER — Other Ambulatory Visit: Payer: Self-pay | Admitting: Internal Medicine

## 2021-04-28 ENCOUNTER — Other Ambulatory Visit: Payer: Self-pay | Admitting: Family Medicine

## 2021-05-05 NOTE — Progress Notes (Signed)
Cardiology Office Note:    Date:  05/13/2021   ID:  Brenda [name], DOB 1961/05/08, MRN [id number]  PCP:  Ria Bush, MD  Cardiologist:  Evalina Field, MD  Electrophysiologist:  None   Referring MD: Ria Bush, MD   Chief Complaint: follow-up of Echo  History of Present Illness:    Brenda [name] is a 60 y.o. female with a history of CAD with STEMI in 10/2020 s/p DES to RCA, chronic combined CHF with EF as low as 30-35% but improved to 60-65% on recent Echo in 05/09/2021, hypertension, hyperlipidemia, type 2 diabetes mellitus, prior TIA, severe COPD on home O2 with ongoing tobacco use, migraines, bipolar disorder and obesity who is followed by Dr. Audie Box and presents today for follow-up of Echo.  Patient was admitted in 01/2020 with acute respiratory failure secondary to COPD exacerbation. High-sensitivity troponin at that time trended up to the 3,000s. She denied any chest pain. Echo showed LVEF of 30-35% with global hypokinesis and grade 2 diastolic dysfunction. LHC was initially considered; however, troponin elevation and Echo were more suggestive of stress-induced cardiomyopathy so this was never done. She also had some SVT with RBBB morphology this admission and was started on Toprol-XL. Repeat Echo was recommended in 3 months with plans to pursue ischemic work-up if EF had not improved. However, 3 days after discharge patient was readmitted with a TIA. Repeat Echo at that time showed LVEF of 40-45%. She had a loop recorder placed in 04/2020 with no evidence of atrial fibrillation/flutter to date. EF later normalized to 55-60% on Echo in 06/2020. She was then admitted in 10/2020 with an acute inferior STEMI after presenting with chest pain. Emergent cardiac catheterization showed 95% thrombotic stenosis of the distal RCA with non-obstructive disease in the LAD and LCX. She underwent successful PCI with DES to RCA lesions and was started on DAPT with Aspirin and Brilinta. Echo at that  time showed LVEF of 45-50% with moderate hypokinesis of the basal -mid inferoseptal wall and inferior wall as well as grade 2 diastolic dysfunction.  Patient was last seen by Dr. Audie Box in 10/2020 at which time she was doing well from a cardiac standpoint. Repeat Echo was ordered in 3 months to LV function. Echo on 05/09/2021 showed LVEF had normalized to 60-65% with normal wall motion and normal diastolic parameters.   Patient presents today for follow-up.  Here alone.  Patient states she has had a difficult year.  She broke her foot and then shortly after that had a heart attack and then her mother passed away.  However, she states she has been doing well from a cardiac standpoint.  She denies any chest pain.  She has chronic shortness of breath, mainly with exertion, due to her COPD (and is on 2L of O2 via nasal cannula). This is stable. She has stable orthopnea and only rare PND.  As long as she wears her CPAP and O2 at night, she does okay.  She states she has lower extremity edema "every once in a while" but nothing significant.  She denies any palpitations.  She has some brief dizziness if she stands up too quickly but no syncope.  She is compliant with all her medications and is tolerating them well.  Patient states she weighs herself daily and weights stable at home. She usually takes Spironolactone 12.5mg  daily. She states if she notices her weights uptrending, she will take 25mg  for a couple of days with improvement and then return to 12.5mg   daily. She states she only has to do this about once per month. This seems to be working well for her so OK to continue.  Past Medical History:  Diagnosis Date   Acute pancreatitis    Asthma    main dyspnea thought due to deconditioning (10/2013)   Bipolar I disorder, most recent episode (or current) manic, unspecified    pt denies this   Bronchitis, chronic obstructive (HCC) 2008 & 2009   FeV1 64% TLC 105% DLCO 55% 2008  -  FeV1 81% FeF 25-75 43% 2009    CAD (coronary artery disease)    inferior STEMI in 10/2020 s/p DES to RCA   Chronic combined systolic and diastolic CHF (congestive heart failure) (HCC)    LVEF 30-35% in 01/2020 but improved to 60-65% in 05/2021   COPD (chronic obstructive pulmonary disease) (HCC)    emphysema, not an issue per pulm (10/2013)   History of pyelonephritis 05/2012   History of ST elevation myocardial infarction (STEMI)    HTN (hypertension)    Hx of migraines    Hyperlipidemia    Knee pain, right    Lumbar disc disease with radiculopathy    multilevel spondylitic changes, scoliosis, and anterolisthesis L4 not thought to currently be surg candidate (Dr. Phoebe Perch, [facility]) (Dr. Ollen Bowl, [facility])   Nicotine addiction    Obesity    RSV (respiratory syncytial virus pneumonia) 01/2020   hospitalization   Suicide attempt (HCC) 06/2001   Swelling of limb    TIA (transient ischemic attack)    Type 2 diabetes mellitus (HCC)    Urge and stress incontinence 07/2012   ([facility])    Past Surgical History:  Procedure Laterality Date   BUBBLE STUDY  04/02/2020   Procedure: BUBBLE STUDY;  Surgeon: Vesta Mixer, MD;  Location: [facility] ENDOSCOPY;  Service: Cardiovascular;;   CHOLECYSTECTOMY  07/1982   CORONARY/GRAFT ACUTE MI REVASCULARIZATION N/A 11/02/2020   Procedure: Coronary/Graft Acute MI Revascularization;  Surgeon: Yvonne Kendall, MD;  Location: [facility] INVASIVE CV LAB;  Service: Cardiovascular;  Laterality: N/A;   CT MAXILLOFACIAL WO/W CM  06/14/06   Left max mucocele   CT of chest  06/14/2006   Normal except c/w active inflammation / infection.  Left renal atrophy   ERCP / sphincterotomy - stenosis  01/15/06   IR ANGIO INTRA EXTRACRAN SEL COM CAROTID INNOMINATE BILAT MOD SED  02/09/2020   IR ANGIO VERTEBRAL SEL SUBCLAVIAN INNOMINATE UNI L MOD SED  02/09/2020   IR ANGIO VERTEBRAL SEL VERTEBRAL UNI R MOD SED  02/09/2020   IR ANGIO VERTEBRAL SEL VERTEBRAL UNI R MOD SED  02/13/2020   IR US GUIDE VASC ACCESS RIGHT  02/09/2020    IR US GUIDE VASC ACCESS RIGHT  02/13/2020   LEFT HEART CATH AND CORONARY ANGIOGRAPHY N/A 11/02/2020   Procedure: LEFT HEART CATH AND CORONARY ANGIOGRAPHY;  Surgeon: Yvonne Kendall, MD;  Location: [facility] INVASIVE CV LAB;  Service: Cardiovascular;  Laterality: N/A;   LOOP RECORDER INSERTION N/A 04/02/2020   Procedure: LOOP RECORDER INSERTION;  Surgeon: Marinus Maw, MD;  Location: [facility] INVASIVE CV LAB;  Service: Cardiovascular;  Laterality: N/A;   lumbar MRI  11/2010   multilevel spondylitic changes with upper lumbar scoliosis and anterolisthesis of L4 on 5 with some L foraminal narrowing esp at L/3 with concavity of scoliosis, some central stenosis and biforaminal narrowing at L4/5 with spondylolisthesis   [place] - Pneumonia, acute asthma, left maxillary sinusitis  01/11 - 06/18/2006   RADIOLOGY WITH ANESTHESIA N/A  02/13/2020   Procedure: Carotid Stent;  Surgeon: Luanne Bras, MD;  Location: [place];  Service: Radiology;  Laterality: N/A;   Retained stone     ERCP secondary to retained stone   Right knee surgery  1984,1989,1989,1991,1994   Reconstructions for ACL insuff   TEE WITHOUT CARDIOVERSION N/A 04/02/2020   Procedure: TRANSESOPHAGEAL ECHOCARDIOGRAM (TEE);  Surgeon: Thayer Headings, MD;  Location: [facility] ENDOSCOPY;  Service: Cardiovascular;  Laterality: N/A;   TUBAL LIGATION      Current Medications: Current Meds  Medication Sig   albuterol (PROVENTIL) (2.5 MG/3ML) 0.083% nebulizer solution INHALE 1 VIAL VIA NEBULIZER EVERY 6 HOURS AS NEEDED FOR WHEEZING/SHORTNESS OF BREATH   ARIPiprazole (ABILIFY) 5 MG tablet TAKE 1 TABLET BY MOUTH EVERY DAY   aspirin EC 81 MG EC tablet Take 1 tablet (81 mg total) by mouth daily. Swallow whole.   atorvastatin (LIPITOR) 80 MG tablet TAKE 1 TABLET BY MOUTH EVERYDAY AT BEDTIME   benzonatate (TESSALON) 200 MG capsule Take 1 capsule (200 mg total) by mouth 3 (three) times daily as needed for cough. Swallow whole, to not bite pill   BREZTRI AEROSPHERE  160-9-4.8 MCG/ACT AERO INHALE 2 PUFFS INTO LUNGS 2 TIMES DAILY (Patient taking differently: Inhale 2 puffs into the lungs in the morning and at bedtime.)   Cholecalciferol (VITAMIN D3) 25 MCG (1000 UT) CAPS Take 1 capsule (1,000 Units total) by mouth daily.   dextromethorphan-guaiFENesin (MUCINEX DM) 30-600 MG 12hr tablet Take 1 tablet by mouth 2 (two) times daily as needed for cough.   empagliflozin (JARDIANCE) 10 MG TABS tablet Take 1 tablet (10 mg total) by mouth daily before breakfast.   ENTRESTO 24-26 MG TAKE 1 TABLET BY MOUTH TWICE A DAY   escitalopram (LEXAPRO) 20 MG tablet TAKE 1 AND 1/2 TABLETS DAILY BY MOUTH   famotidine (PEPCID) 20 MG tablet TAKE 1 TABLET BY MOUTH EVERYDAY AT BEDTIME (Patient taking differently: Take 20 mg by mouth at bedtime.)   fluticasone (FLONASE) 50 MCG/ACT nasal spray PLACE 1 SPRAY INTO BOTH NOSTRILS 2 (TWO) TIMES DAILY.   furosemide (LASIX) 20 MG tablet TAKE 1 TABLET BY MOUTH EVERY DAY (Patient taking differently: Take 20 mg by mouth daily.)   gabapentin (NEURONTIN) 300 MG capsule TAKE 2 CAPSULES IN THE MORNING AND 3 CAPSULES AT NIGHT   LORazepam (ATIVAN) 0.5 MG tablet TAKE 1/2 TO 1 TABLET BY MOUTH TWICE A DAY AS NEEDED FOR ANXIETY   metoprolol succinate (TOPROL-XL) 25 MG 24 hr tablet TAKE 1 TABLET (25 MG TOTAL) BY MOUTH DAILY.   Multiple Vitamin (MULTIVITAMIN WITH MINERALS) TABS tablet Take 1 tablet by mouth daily.   nitroGLYCERIN (NITROSTAT) 0.4 MG SL tablet Place 1 tablet (0.4 mg total) under the tongue every 5 (five) minutes as needed.   nystatin (MYCOSTATIN) 100000 UNIT/ML suspension TAKE 1 TEASPOONFUL BY MOUTH 3 TIMES DAILY AS NEEDED FOR MOUTH SORES.   PROAIR RESPICLICK 123XX123 (90 Base) MCG/ACT AEPB INHALE 2 PUFFS INTO THE LUNGS EVERY 6 HOURS AS NEEDED FOR DYSPNEA/WHEEZE   spironolactone (ALDACTONE) 25 MG tablet Take 12.5 mg by mouth daily.   ticagrelor (BRILINTA) 90 MG TABS tablet Take 1 tablet (90 mg total) by mouth 2 (two) times daily.   [DISCONTINUED]  metFORMIN (GLUCOPHAGE) 500 MG tablet TAKE 1 TABLET BY MOUTH EVERYDAY AT BEDTIME     Allergies:   Metolazone   Social History   Socioeconomic History   Marital status: Widowed    Spouse name: Not on file   Number of children: 3  Years of education: Not on file   Highest education level: Not on file  Occupational History   Occupation: Nursing Secretary    Employer: [employer]     Employer: [employer]   Tobacco Use   Smoking status: Every Day    Packs/day: 0.25    Years: 41.00    Pack years: 10.25    Types: Cigarettes   Smokeless tobacco: Never  Vaping Use   Vaping Use: Never used  Substance and Sexual Activity   Alcohol use: Yes    Alcohol/week: 0.0 standard drinks    Comment: occasional   Drug use: Not Currently    Types: Marijuana   Sexual activity: Not on file  Other Topics Concern   Not on file  Social History Narrative   Lives with husband and son and daughter in [place] and [place]   Occupation: Art therapist at [employer]   Activity: limited by dyspnea   Diet: good water, fruits/vegetables daily   Social Determinants of Health   Financial Resource Strain: Low Risk    Difficulty of Paying Living Expenses: Not very hard  Food Insecurity: Not on file  Transportation Needs: Not on file  Physical Activity: Not on file  Stress: Not on file  Social Connections: Not on file     Family History: The patient's family history includes Asthma in her father; Breast cancer in her paternal aunt and paternal grandmother; Heart disease in her brother and mother; Hyperlipidemia in her mother; Hypertension in her father and mother.  ROS:   Please see the history of present illness.     EKGs/Labs/Other Studies Reviewed:    The following studies were reviewed today:  Left Cardiac Catheterization 11/02/2020: Conclusions: Severe single-vessel coronary artery disease with 95% thrombotic stenosis of distal RCA and TIMI-2 flow into its  distal branches. Nonobstructive LAD/LCx disease.  The distal LAD tapers to a small vessel, terminating before the apex. Normal left ventricular filling pressure (LVEDP 12 mmHg). Successful PCI to distal RCA using Synergy 3.5 x 20 mm drug-eluting stent (postdilated to 4.1 mm) with 0% residual stenosis and TIMI-3 flow.   Recommendations: Dual antiplatelet therapy with aspirin and ticagrelor for at least 12 months. Aggressive secondary prevention, including high intensity statin therapy and tobacco cessation. Obtain transthoracic echocardiogram.  Diagnostic Dominance: Right Intervention    Echocardiogram 11/03/2020: Impressions: 1. Left ventricular ejection fraction, by estimation, is 45 to 50%. The  left ventricle has mildly decreased function. The left ventricle  demonstrates regional wall motion abnormalities (see scoring  diagram/findings for description). Left ventricular  diastolic parameters are consistent with Grade II diastolic dysfunction  (pseudonormalization). Elevated left atrial pressure.   2. Right ventricular systolic function is normal. The right ventricular  size is normal.   3. The mitral valve is normal in structure. No evidence of mitral valve  regurgitation. No evidence of mitral stenosis.   4. The aortic valve is normal in structure. Aortic valve regurgitation is  not visualized. No aortic stenosis is present.   5. The inferior vena cava is dilated in size with >50% respiratory  variability, suggesting right atrial pressure of 8 mmHg.   Comparison(s): Prior images reviewed side by side. The left ventricular  function is worsened. The left ventricular diastolic function is  significantly worse. The left ventricular wall motion abnormality is are  new. _______________  Echocardiogram 05/09/2021:  Impressions:  1. Left ventricular ejection fraction, by estimation, is 60 to 65%. The  left ventricle has normal function. The  left ventricle has no regional  wall  motion abnormalities. Left ventricular diastolic parameters were  normal.   2. Right ventricular systolic function is normal. The right ventricular  size is normal.   3. The mitral valve is normal in structure. Trivial mitral valve  regurgitation.   4. The aortic valve is normal in structure. Aortic valve regurgitation is  not visualized.   5. The inferior vena cava is normal in size with greater than 50%  respiratory variability, suggesting right atrial pressure of 3 mmHg.   Comparison(s): The left ventricular function is unchanged. 11/03/20 EF  45-50%.  EKG:  EKG not ordered today.   Recent Labs: 11/02/2020: ALT 15 11/03/2020: BUN 13; Hemoglobin 13.3; Platelets 312; Potassium 3.8; Sodium 139 02/25/2021: Creatinine, Ser 1.00  Recent Lipid Panel    Component Value Date/Time   CHOL 118 11/02/2020 1423   TRIG 135 11/02/2020 1423   HDL 42 11/02/2020 1423   CHOLHDL 2.8 11/02/2020 1423   VLDL 27 11/02/2020 1423   LDLCALC 49 11/02/2020 1423    Physical Exam:    Vital Signs: BP 122/72   Pulse 66   Ht 5\' 1"  (1.549 m)   Wt 233 lb 9.6 oz (106 kg)   LMP 03/08/2013   SpO2 98%   BMI 44.14 kg/m     Wt Readings from Last 3 Encounters:  05/13/21 233 lb 9.6 oz (106 kg)  04/04/21 228 lb 4 oz (103.5 kg)  01/07/21 231 lb 6.4 oz (105 kg)     General: 60 y.o. obese Caucasian female in no acute distress. On 2L of O2 via nasal cannula.  HEENT: Normocephalic and atraumatic. Sclera clear.  Neck: Supple. No carotid bruits. No JVD. Heart: RRR. Distinct S1 and S2. No murmurs, gallops, or rubs. Radial pulses 2+ and equal bilaterally. Lungs: No increased work of breathing. Clear to ausculation bilaterally. No wheezes, rhonchi, or rales.  Abdomen: Soft, non-distended, and non-tender to palpation.  Extremities: Trace lower extremity edema bilaterally. Skin: Warm and dry. Neuro: Alert and oriented x3. No focal deficits. Psych: Normal affect. Responds appropriately.  Assessment:    1. Coronary  artery disease involving native coronary artery of native heart without angina pectoris   2. History of ST elevation myocardial infarction (STEMI)   3. Chronic combined systolic and diastolic CHF (congestive heart failure) ([place])   4. Primary hypertension   5. Hyperlipidemia, unspecified hyperlipidemia type   6. Type 2 diabetes mellitus with complication, without long-term current use of insulin (HCC)   7. History of TIA (transient ischemic attack)   8. Tobacco abuse     Plan:    CAD History of inferior STEMI in 10/2020 s/p DES to distal RCA.  - No angina.  - Continue DAPT with Aspirin 81mg  daily and Brilinta 90mg  twice daily. - Continue beta-blocker and high-intensity statin.  Chronic Combined CHF LVEF as low as 30-35% in 01/2020 but then normalized to 55-60% on Echo in 06/2020. At time of STEMI in 10/2020, EF dropped to 45-50%. However, EF normalized to 60-65% on recent Echo on 05/09/2021. - Euvolemic on exam. - Continue Lasix 20mg  daily. - Continue Entresto 24-26mg  twice daily. - Continue Toprol-XL 25mg  daily.  - Continue Spironolactone 12.5mg  daily.  - Continue Jardiance 10mg  daily. - Discussed importance of daily weights and sodium/fluid restrictions. Of note, patient states she increases her Spironolactone to 25mg  daily for 2-3 days when she notices any increase in her weights and then will go back to 12.5mg  dosing. She only has to  do this about once per month. This seems to be working well for her so OK to continue.   Hypertension BP well controlled.  - Continue medications for CHF as above.  Hyperlipidemia Lipid panel in 10/2020: Total Cholesterol 118, Triglycerides 135, HDL 42, LDL 49. LDL goal <55. - Continue Lipitor 80mg  daily.  Type 2 Diabetes Mellitus Hemoglobin A1c 6.3 in 04/2021.  - On Jardiance. Previously on Metformin but PCP recently stopped this due to improvement in Hemoglobin A1c. - Management per PCP.  History of TIA S/p loop recorder in 04/2020. No evidence  of atrial fibrillation/flutter to date. - Continue antiplatelet and statin therapy.  Disposition: Follow up in 6 months.   Medication Adjustments/Labs and Tests Ordered: Current medicines are reviewed at length with the patient today.  Concerns regarding medicines are outlined above.  No orders of the defined types were placed in this encounter.  No orders of the defined types were placed in this encounter.   Patient Instructions  Medication Instructions:  No Changes *If you need a refill on your cardiac medications before your next appointment, please call your pharmacy*   Lab Work: No Labs If you have labs (blood work) drawn today and your tests are completely normal, you will receive your results only by: [place] (if you have MyChart) OR A paper copy in the mail If you have any lab test that is abnormal or we need to change your treatment, we will call you to review the results.   Testing/Procedures: No Testing   Follow-Up: At [facility], you and your health needs are our priority.  As part of our continuing mission to provide you with exceptional heart care, we have created designated Provider Care Teams.  These Care Teams include your primary Cardiologist (physician) and Advanced Practice Providers (APPs -  Physician Assistants and Nurse Practitioners) who all work together to provide you with the care you need, when you need it.  We recommend signing up for the patient portal called "MyChart".  Sign up information is provided on this After Visit Summary.  MyChart is used to connect with patients for Virtual Visits (Telemedicine).  Patients are able to view lab/test results, encounter notes, upcoming appointments, etc.  Non-urgent messages can be sent to your provider as well.   To learn more about what you can do with MyChart, go to [email or web address].    Your next appointment:   6 month(s)  The format for your next appointment:   In Person  Provider:    Evalina Field, MD          Signed, Darreld Mclean, PA-C  05/13/2021 12:03 PM    [place]

## 2021-05-09 ENCOUNTER — Other Ambulatory Visit: Payer: Self-pay

## 2021-05-09 ENCOUNTER — Ambulatory Visit (HOSPITAL_COMMUNITY): Payer: Medicare Other | Attending: Internal Medicine

## 2021-05-09 DIAGNOSIS — I5022 Chronic systolic (congestive) heart failure: Secondary | ICD-10-CM | POA: Diagnosis present

## 2021-05-09 LAB — ECHOCARDIOGRAM COMPLETE
Area-P 1/2: 2.99 cm2
S' Lateral: 3.2 cm

## 2021-05-09 MED ORDER — PERFLUTREN LIPID MICROSPHERE
1.0000 mL | INTRAVENOUS | Status: AC | PRN
  Administered 2021-05-09: 1 mL via INTRAVENOUS

## 2021-05-10 ENCOUNTER — Encounter: Payer: Self-pay | Admitting: Student

## 2021-05-10 DIAGNOSIS — I252 Old myocardial infarction: Secondary | ICD-10-CM | POA: Insufficient documentation

## 2021-05-10 DIAGNOSIS — I2510 Atherosclerotic heart disease of native coronary artery without angina pectoris: Secondary | ICD-10-CM | POA: Insufficient documentation

## 2021-05-12 ENCOUNTER — Ambulatory Visit (INDEPENDENT_AMBULATORY_CARE_PROVIDER_SITE_OTHER): Payer: Medicare Other

## 2021-05-12 DIAGNOSIS — G459 Transient cerebral ischemic attack, unspecified: Secondary | ICD-10-CM | POA: Diagnosis not present

## 2021-05-13 ENCOUNTER — Ambulatory Visit (INDEPENDENT_AMBULATORY_CARE_PROVIDER_SITE_OTHER): Payer: Medicare Other | Admitting: Student

## 2021-05-13 ENCOUNTER — Other Ambulatory Visit: Payer: Self-pay

## 2021-05-13 ENCOUNTER — Encounter: Payer: Self-pay | Admitting: Student

## 2021-05-13 VITALS — BP 122/72 | HR 66 | Ht 61.0 in | Wt 233.6 lb

## 2021-05-13 DIAGNOSIS — I2510 Atherosclerotic heart disease of native coronary artery without angina pectoris: Secondary | ICD-10-CM | POA: Diagnosis not present

## 2021-05-13 DIAGNOSIS — Z720 Tobacco use: Secondary | ICD-10-CM

## 2021-05-13 DIAGNOSIS — E785 Hyperlipidemia, unspecified: Secondary | ICD-10-CM

## 2021-05-13 DIAGNOSIS — I10 Essential (primary) hypertension: Secondary | ICD-10-CM | POA: Diagnosis not present

## 2021-05-13 DIAGNOSIS — I252 Old myocardial infarction: Secondary | ICD-10-CM

## 2021-05-13 DIAGNOSIS — E118 Type 2 diabetes mellitus with unspecified complications: Secondary | ICD-10-CM

## 2021-05-13 DIAGNOSIS — I5042 Chronic combined systolic (congestive) and diastolic (congestive) heart failure: Secondary | ICD-10-CM

## 2021-05-13 DIAGNOSIS — Z8673 Personal history of transient ischemic attack (TIA), and cerebral infarction without residual deficits: Secondary | ICD-10-CM

## 2021-05-13 LAB — CUP PACEART REMOTE DEVICE CHECK
Date Time Interrogation Session: 20221203170419
Implantable Pulse Generator Implant Date: 20211102

## 2021-05-13 NOTE — Patient Instructions (Signed)
Medication Instructions:  °No Changes °*If you need a refill on your cardiac medications before your next appointment, please call your pharmacy* ° ° °Lab Work: °No Labs °If you have labs (blood work) drawn today and your tests are completely normal, you will receive your results only by: °MyChart Message (if you have MyChart) OR °A paper copy in the mail °If you have any lab test that is abnormal or we need to change your treatment, we will call you to review the results. ° ° °Testing/Procedures: °No Testing ° ° °Follow-Up: °At [facility], you and your health needs are our priority.  As part of our continuing mission to provide you with exceptional heart care, we have created designated Provider Care Teams.  These Care Teams include your primary Cardiologist (physician) and Advanced Practice Providers (APPs -  Physician Assistants and Nurse Practitioners) who all work together to provide you with the care you need, when you need it. ° °We recommend signing up for the patient portal called "MyChart".  Sign up information is provided on this After Visit Summary.  MyChart is used to connect with patients for Virtual Visits (Telemedicine).  Patients are able to view lab/test results, encounter notes, upcoming appointments, etc.  Non-urgent messages can be sent to your provider as well.   °To learn more about what you can do with MyChart, go to [email or web address].   ° °Your next appointment:   °6 month(s) ° °The format for your next appointment:   °In Person ° °Provider:   °Kidder T O'Neal, MD   ° ° °  °

## 2021-05-20 ENCOUNTER — Telehealth: Payer: Self-pay

## 2021-05-20 NOTE — Progress Notes (Signed)
Carelink Summary Report / Loop Recorder 

## 2021-05-20 NOTE — Progress Notes (Signed)
Chronic Care Management Pharmacy Assistant   Name: Brenda [name]  MRN: [id number] DOB: 06-14-1960  Reason for Encounter: CCM (General Adherence)    Recent office visits:  04/04/2021 - [name], MD - Patient presented for follow up for diabetes. Labs: A1c. Change: per Dr. Sharen Hones try 1/2 lorazepam each use. Change; Lexapro to 20 mg daily vs. 1 and 1/2 tablets daily.  02/07/2021 - [name], MD - Telephone: Hold: Metformin - No other information.   Recent consult visits:  05/13/2021 - Marjie Skiff, PA - Cardiology - Patient presented for coronary artery disease. Patient reports Metformin was stopped by PCP due to improved A1c.   Hospital visits:  None in previous 6 months  Medications: Outpatient Encounter Medications as of 05/20/2021  Medication Sig Note   albuterol (PROVENTIL) (2.5 MG/3ML) 0.083% nebulizer solution INHALE 1 VIAL VIA NEBULIZER EVERY 6 HOURS AS NEEDED FOR WHEEZING/SHORTNESS OF BREATH    ARIPiprazole (ABILIFY) 5 MG tablet TAKE 1 TABLET BY MOUTH EVERY DAY    aspirin EC 81 MG EC tablet Take 1 tablet (81 mg total) by mouth daily. Swallow whole.    atorvastatin (LIPITOR) 80 MG tablet TAKE 1 TABLET BY MOUTH EVERYDAY AT BEDTIME    benzonatate (TESSALON) 200 MG capsule Take 1 capsule (200 mg total) by mouth 3 (three) times daily as needed for cough. Swallow whole, to not bite pill    BREZTRI AEROSPHERE 160-9-4.8 MCG/ACT AERO INHALE 2 PUFFS INTO LUNGS 2 TIMES DAILY (Patient taking differently: Inhale 2 puffs into the lungs in the morning and at bedtime.)    Cholecalciferol (VITAMIN D3) 25 MCG (1000 UT) CAPS Take 1 capsule (1,000 Units total) by mouth daily.    dextromethorphan-guaiFENesin (MUCINEX DM) 30-600 MG 12hr tablet Take 1 tablet by mouth 2 (two) times daily as needed for cough.    empagliflozin (JARDIANCE) 10 MG TABS tablet Take 1 tablet (10 mg total) by mouth daily before breakfast.    ENTRESTO 24-26 MG TAKE 1 TABLET BY MOUTH TWICE A DAY     escitalopram (LEXAPRO) 20 MG tablet TAKE 1 AND 1/2 TABLETS DAILY BY MOUTH    famotidine (PEPCID) 20 MG tablet TAKE 1 TABLET BY MOUTH EVERYDAY AT BEDTIME (Patient taking differently: Take 20 mg by mouth at bedtime.)    fluticasone (FLONASE) 50 MCG/ACT nasal spray PLACE 1 SPRAY INTO BOTH NOSTRILS 2 (TWO) TIMES DAILY.    furosemide (LASIX) 20 MG tablet TAKE 1 TABLET BY MOUTH EVERY DAY (Patient taking differently: Take 20 mg by mouth daily.)    gabapentin (NEURONTIN) 300 MG capsule TAKE 2 CAPSULES IN THE MORNING AND 3 CAPSULES AT NIGHT    LORazepam (ATIVAN) 0.5 MG tablet TAKE 1/2 TO 1 TABLET BY MOUTH TWICE A DAY AS NEEDED FOR ANXIETY    metoprolol succinate (TOPROL-XL) 25 MG 24 hr tablet TAKE 1 TABLET (25 MG TOTAL) BY MOUTH DAILY.    Multiple Vitamin (MULTIVITAMIN WITH MINERALS) TABS tablet Take 1 tablet by mouth daily.    nitroGLYCERIN (NITROSTAT) 0.4 MG SL tablet Place 1 tablet (0.4 mg total) under the tongue every 5 (five) minutes as needed. 01/07/2021: Patient has if needed   nystatin (MYCOSTATIN) 100000 UNIT/ML suspension TAKE 1 TEASPOONFUL BY MOUTH 3 TIMES DAILY AS NEEDED FOR MOUTH SORES.    PROAIR RESPICLICK 108 (90 Base) MCG/ACT AEPB INHALE 2 PUFFS INTO THE LUNGS EVERY 6 HOURS AS NEEDED FOR DYSPNEA/WHEEZE    spironolactone (ALDACTONE) 25 MG tablet Take 12.5 mg by mouth daily.    ticagrelor (  BRILINTA) 90 MG TABS tablet Take 1 tablet (90 mg total) by mouth 2 (two) times daily.    No facility-administered encounter medications on file as of 05/20/2021.   Tried to reach patient 3 times on 12/21, 12/22 and 12/27. Unsuccessful outreach. Will attempt to reach patient next month.   Patient is not > 5 days past due for refill on the following medications per chart history:  [facility] Medications: Medication Name/mg Last Fill Days Supply Atorvatatin 80 mg  04/30/2021 90 Jardiance   03/11/2021 90  Entresto 24 mg-26 mg 02/18/2021 90  Care Gaps: Annual wellness visit in last year? Yes 10/01/2020 Most  Recent BP reading:  122/72 on 05/13/2021  If Diabetic: Most recent A1C reading: 6.3 on 04/04/2021 Last eye exam / retinopathy screening: Never completed per Care Gaps report Last diabetic foot exam: 11/28/2018  No appointments scheduled within the next 30 days.  Al Corpus, CPP notified  Claudina Lick, [place] Clinical Pharmacy Assistant [phone]

## 2021-05-27 ENCOUNTER — Other Ambulatory Visit: Payer: Self-pay | Admitting: Family Medicine

## 2021-06-07 DIAGNOSIS — G4733 Obstructive sleep apnea (adult) (pediatric): Secondary | ICD-10-CM | POA: Diagnosis not present

## 2021-06-09 DIAGNOSIS — G4733 Obstructive sleep apnea (adult) (pediatric): Secondary | ICD-10-CM | POA: Diagnosis not present

## 2021-06-14 ENCOUNTER — Other Ambulatory Visit: Payer: Self-pay | Admitting: Cardiovascular Disease

## 2021-06-14 ENCOUNTER — Other Ambulatory Visit: Payer: Self-pay | Admitting: Family Medicine

## 2021-06-16 ENCOUNTER — Ambulatory Visit (INDEPENDENT_AMBULATORY_CARE_PROVIDER_SITE_OTHER): Payer: Medicare Other

## 2021-06-16 DIAGNOSIS — G459 Transient cerebral ischemic attack, unspecified: Secondary | ICD-10-CM | POA: Diagnosis not present

## 2021-06-17 LAB — CUP PACEART REMOTE DEVICE CHECK
Date Time Interrogation Session: 20230115230852
Implantable Pulse Generator Implant Date: 20211102

## 2021-06-22 ENCOUNTER — Other Ambulatory Visit: Payer: Self-pay | Admitting: Family Medicine

## 2021-06-23 NOTE — Telephone Encounter (Signed)
ERx 

## 2021-06-25 NOTE — Progress Notes (Signed)
Carelink Summary Report / Loop Recorder 

## 2021-07-08 DIAGNOSIS — G4733 Obstructive sleep apnea (adult) (pediatric): Secondary | ICD-10-CM | POA: Diagnosis not present

## 2021-07-10 ENCOUNTER — Other Ambulatory Visit: Payer: Self-pay | Admitting: Family Medicine

## 2021-07-14 NOTE — Telephone Encounter (Signed)
Plz address in Dr. Timoteo Expose absence.  Gabapentin Last filled:  05/06/21, #450 Last OV:  04/04/21 6 mo f/u Next OV:  10/08/21, AWV prt 2

## 2021-07-21 ENCOUNTER — Ambulatory Visit (INDEPENDENT_AMBULATORY_CARE_PROVIDER_SITE_OTHER): Payer: Medicare Other

## 2021-07-21 DIAGNOSIS — G459 Transient cerebral ischemic attack, unspecified: Secondary | ICD-10-CM | POA: Diagnosis not present

## 2021-07-21 LAB — CUP PACEART REMOTE DEVICE CHECK
Date Time Interrogation Session: 20230217230339
Implantable Pulse Generator Implant Date: 20211102

## 2021-07-22 ENCOUNTER — Other Ambulatory Visit: Payer: Self-pay | Admitting: Internal Medicine

## 2021-07-22 DIAGNOSIS — J449 Chronic obstructive pulmonary disease, unspecified: Secondary | ICD-10-CM

## 2021-07-24 NOTE — Progress Notes (Signed)
Carelink Summary Report / Loop Recorder 

## 2021-07-28 ENCOUNTER — Other Ambulatory Visit: Payer: Self-pay | Admitting: Family Medicine

## 2021-07-30 ENCOUNTER — Telehealth: Payer: Self-pay

## 2021-07-30 NOTE — Progress Notes (Signed)
? ? ?Chronic Care Management ?Pharmacy Assistant  ? ?Name: Brenda [name]  MRN: [id number] DOB: Jul 21, 1960 ? ?Reason for Encounter: CCM Counsellor) ?  ?Medications: ?Outpatient Encounter Medications as of 07/30/2021  ?Medication Sig Note  ? albuterol (PROVENTIL) (2.5 MG/3ML) 0.083% nebulizer solution INHALE 1 VIAL VIA NEBULIZER EVERY 6 HOURS AS NEEDED FOR WHEEZING/SHORTNESS OF BREATH   ? ARIPiprazole (ABILIFY) 5 MG tablet TAKE 1 TABLET BY MOUTH EVERY DAY   ? aspirin EC 81 MG EC tablet Take 1 tablet (81 mg total) by mouth daily. Swallow whole.   ? atorvastatin (LIPITOR) 80 MG tablet TAKE 1 TABLET BY MOUTH EVERYDAY AT BEDTIME   ? benzonatate (TESSALON) 200 MG capsule Take 1 capsule (200 mg total) by mouth 3 (three) times daily as needed for cough. Swallow whole, to not bite pill   ? BREZTRI AEROSPHERE 160-9-4.8 MCG/ACT AERO INHALE 2 PUFFS INTO LUNGS 2 TIMES DAILY (Patient taking differently: Inhale 2 puffs into the lungs in the morning and at bedtime.)   ? Cholecalciferol (VITAMIN D3) 25 MCG (1000 UT) CAPS Take 1 capsule (1,000 Units total) by mouth daily.   ? dextromethorphan-guaiFENesin (MUCINEX DM) 30-600 MG 12hr tablet Take 1 tablet by mouth 2 (two) times daily as needed for cough.   ? ENTRESTO 24-26 MG TAKE 1 TABLET BY MOUTH TWICE A DAY   ? escitalopram (LEXAPRO) 20 MG tablet TAKE 1 AND 1/2 TABLETS BY MOUTH DAILY   ? famotidine (PEPCID) 20 MG tablet TAKE 1 TABLET BY MOUTH EVERYDAY AT BEDTIME (Patient taking differently: Take 20 mg by mouth at bedtime.)   ? fluticasone (FLONASE) 50 MCG/ACT nasal spray PLACE 1 SPRAY INTO BOTH NOSTRILS 2 (TWO) TIMES DAILY.   ? furosemide (LASIX) 20 MG tablet TAKE 1 TABLET BY MOUTH EVERY DAY (Patient taking differently: Take 20 mg by mouth daily.)   ? gabapentin (NEURONTIN) 300 MG capsule TAKE 2 CAPSULES IN THE MORNING AND 3 CAPSULES AT NIGHT   ? JARDIANCE 10 MG TABS tablet TAKE 1 TABLET BY MOUTH DAILY BEFORE BREAKFAST.   ? LORazepam (ATIVAN) 0.5 MG tablet TAKE 1/2 TO 1 TABLET  BY MOUTH TWICE A DAY AS NEEDED FOR ANXIETY   ? metoprolol succinate (TOPROL-XL) 25 MG 24 hr tablet TAKE 1 TABLET (25 MG TOTAL) BY MOUTH DAILY.   ? Multiple Vitamin (MULTIVITAMIN WITH MINERALS) TABS tablet Take 1 tablet by mouth daily.   ? nitroGLYCERIN (NITROSTAT) 0.4 MG SL tablet Place 1 tablet (0.4 mg total) under the tongue every 5 (five) minutes as needed. 01/07/2021: Patient has if needed  ? nystatin (MYCOSTATIN) 100000 UNIT/ML suspension TAKE 1 TEASPOONFUL BY MOUTH 3 TIMES DAILY AS NEEDED FOR MOUTH SORES.   ? PROAIR RESPICLICK [id number] (90 Base) MCG/ACT AEPB INHALE 2 PUFFS INTO THE LUNGS EVERY 6 HOURS AS NEEDED FOR DYSPNEA/WHEEZE   ? spironolactone (ALDACTONE) 25 MG tablet TAKE 1/2 TABLET BY MOUTH EVERY DAY   ? ticagrelor (BRILINTA) 90 MG TABS tablet Take 1 tablet (90 mg total) by mouth 2 (two) times daily.   ? ?No facility-administered encounter medications on file as of 07/30/2021.  ? ?Brenda [name] was contacted to remind of upcoming telephone visit with Charlene Brooke on 08/01/2021 at 1:00 pm. Patient was reminded to have all medications, supplements and any blood glucose and blood pressure readings available for review at appointment. If unable to reach, a voicemail was left for patient.  ? ?[facility] Rating Drugs: ?Medication:  Last Fill: Day Supply ?Jardiance 10 mg 06/17/2021 90 ?Atorvastatin 80 mg  04/30/2021 90 ?Fill dates verified with [facility]  ? ?Charlene Brooke, CPP notified ? ?Marijean Niemann, RMA ?Clinical Pharmacy Assistant ?[phone] ? ?Time Spent: 10 Minutes ? ? ? ? ?

## 2021-07-31 ENCOUNTER — Other Ambulatory Visit: Payer: Self-pay | Admitting: Family Medicine

## 2021-07-31 ENCOUNTER — Other Ambulatory Visit: Payer: Self-pay | Admitting: Cardiology

## 2021-07-31 MED ORDER — TICAGRELOR 90 MG PO TABS: 90.0000 mg | ORAL_TABLET | Freq: Two times a day (BID) | ORAL | 2 refills | Status: DC

## 2021-07-31 NOTE — Telephone Encounter (Signed)
This is Dr. O'Neal's pt 

## 2021-08-01 ENCOUNTER — Other Ambulatory Visit: Payer: Self-pay

## 2021-08-01 ENCOUNTER — Ambulatory Visit (INDEPENDENT_AMBULATORY_CARE_PROVIDER_SITE_OTHER): Payer: Medicare Other | Admitting: Pharmacist

## 2021-08-01 DIAGNOSIS — I10 Essential (primary) hypertension: Secondary | ICD-10-CM

## 2021-08-01 DIAGNOSIS — I5042 Chronic combined systolic (congestive) and diastolic (congestive) heart failure: Secondary | ICD-10-CM

## 2021-08-01 DIAGNOSIS — E1142 Type 2 diabetes mellitus with diabetic polyneuropathy: Secondary | ICD-10-CM

## 2021-08-01 DIAGNOSIS — F17210 Nicotine dependence, cigarettes, uncomplicated: Secondary | ICD-10-CM

## 2021-08-01 DIAGNOSIS — J4530 Mild persistent asthma, uncomplicated: Secondary | ICD-10-CM

## 2021-08-01 DIAGNOSIS — J449 Chronic obstructive pulmonary disease, unspecified: Secondary | ICD-10-CM

## 2021-08-01 DIAGNOSIS — I2510 Atherosclerotic heart disease of native coronary artery without angina pectoris: Secondary | ICD-10-CM

## 2021-08-01 DIAGNOSIS — F4322 Adjustment disorder with anxiety: Secondary | ICD-10-CM

## 2021-08-01 NOTE — Progress Notes (Signed)
Chronic Care Management Pharmacy Note  08/01/2021 Name:  Brenda [name] MRN:  [id number] DOB:  [birth year]  Summary: CCM F/U vit -Pt reports BP at goal 115-120/60s, and fasting BG at goal 90-115. She endorses compliance with medications and denies issues -Pt is down to 2 cigarettes per day - she uses mainly to help with anxiety  Recommendations/Changes made from today's visit: -No med changes. Encouraged continued efforts to quit smoking.  Plan: -[place] will call patient 6 months for BP/BG update -Pharmacist follow up televisit scheduled for 1 year -PCP annual visit 10/08/21    Subjective: Brenda [name] is an 61 y.o. year old female who is a primary patient of Ria Bush, MD.  The CCM team was consulted for assistance with disease management and care coordination needs.    Engaged with patient by telephone for follow up visit in response to provider referral for pharmacy case management and/or care coordination services.   Consent to Services:  The patient was given information about Chronic Care Management services, agreed to services, and gave verbal consent prior to initiation of services.  Please see initial visit note for detailed documentation.   Patient Care Team: Ria Bush, MD as PCP - General O'Neal, Cassie Freer, MD as PCP - Cardiology (Internal Medicine) Charlton Haws, [facility] as Pharmacist (Pharmacist)  Patient is from [place] but has lived in [place] for many years. She is currently staying with her 63 yo father as her mother passed in June. She has been on disability since 2019 for COPD.  Recent office visits: 04/04/21 Dr Darnell Level OV: f/u DM; A1c 6.3, off metformin. Try reducing lexapro to 20 mg, try 1/2 lorazepam dose. RTC 6 months (annual exam)  10/01/20 Dr Danise Mina OV: AWV - restart atorvastatin, start Vitamin D 1000 IU daily (Vit D level 26).  Recent consult visits: 05/13/21 PA [name] (cardiology): f/u CAD. Continue  DAPT. EF normalized 60-65% 05/09/21. No med changes.  01/07/21 O'Neal, Cassie Freer, MD-Cardiology (Coronary artery disease involving native coronary artery of native heart without angina pectoris) No med changes. Repeat ECHO 3 months.  12/31/20 Newt Minion, MD-Orthopedic Surgery (Ankle fracture, left, closed) ordered: XR Ankle Complete Left, No med changes   12/03/20 Newt Minion, MD-Orthopedic Surgery (Pain in left ankle and joints of left foot)   11/14/20 Persons, Bevely Palmer, PA-Orthopedic Surgery (Pain in left ankle and joints of left foot)   10/31/20 Newt Minion, MD-Orthopedic Surgery (Pain in left ankle and joints of left foot) Meds ordered:oxyCODONE-acetaminophen    10/08/20 NP Frann Rider (neurology): f/u TIA (Sept 2021); no med changes.  09/13/20 O'NealCassie Freer, MD-Cardiology (Chronic systolic heart failure) No med changes  Hospital visits: Medication Reconciliation was completed by comparing discharge summary, patients EMR and Pharmacy list, and upon discussion with patient.  Admitted to the hospital on 11/02/20 due to STEMI. Discharge date was 11/04/20. Discharged from [place]?Medications Started at [facility] Discharge:?? -started Brilinta, Nitroglycerin due to STEMI  Medication Changes at Hospital Discharge: -Changed atorvastatin from 40 to 80 mg  Medications that remain the same after Hospital Discharge:??  -All other medications will remain the same.    Admitted to the ED on 10/20/20 due to Ankle Fracture. Discharge date was 10/20/20. Discharged from [place]?Medications Started at [facility] Discharge:?? -started Oxydodone 5 mg due to pain  Medications that remain the same after Hospital Discharge:??  -All other medications will remain the  same.     Objective:  Lab Results  Component Value Date   CREATININE 1.00 02/25/2021   BUN 13 11/03/2020   GFR 60.51 09/24/2020   GFRNONAA >60 11/03/2020   GFRAA 65 06/10/2020    NA 139 11/03/2020   K 3.8 11/03/2020   CALCIUM 9.2 11/03/2020   CO2 27 11/03/2020   GLUCOSE 101 (H) 11/03/2020    Lab Results  Component Value Date/Time   HGBA1C 6.3 (A) 04/04/2021 03:22 PM   HGBA1C 5.8 (H) 11/02/2020 02:23 PM   HGBA1C 5.8 09/24/2020 08:43 AM   GFR 60.51 09/24/2020 08:43 AM   GFR 47.49 (L) 07/31/2019 08:08 AM   MICROALBUR <0.7 07/31/2019 08:08 AM   MICROALBUR 1.7 07/22/2018 10:46 AM    Last diabetic Eye exam: No results found for: HMDIABEYEEXA  Last diabetic Foot exam: No results found for: HMDIABFOOTEX   Lab Results  Component Value Date   CHOL 118 11/02/2020   HDL 42 11/02/2020   LDLCALC 49 11/02/2020   TRIG 135 11/02/2020   CHOLHDL 2.8 11/02/2020    Hepatic Function Latest Ref Rng & Units 11/02/2020 09/24/2020 02/07/2020  Total Protein 6.5 - 8.1 g/dL 7.8 6.8 7.0  Albumin 3.5 - 5.0 g/dL 3.6 3.6 3.5  AST 15 - 41 U/L '18 12 23  ' ALT 0 - 44 U/L 15 11 126(H)  Alk Phosphatase 38 - 126 U/L 101 113 88  Total Bilirubin 0.3 - 1.2 mg/dL 0.6 0.3 0.5  Bilirubin, Direct 0.0 - 0.2 mg/dL - - -    Lab Results  Component Value Date/Time   TSH 0.821 02/08/2020 08:31 AM   TSH 2.27 07/24/2016 04:54 PM   TSH 2.93 09/11/2014 08:47 AM    CBC Latest Ref Rng & Units 11/03/2020 11/02/2020 11/02/2020  WBC 4.0 - 10.5 K/uL 14.1(H) - 18.5(H)  Hemoglobin 12.0 - 15.0 g/dL 13.3 13.3 13.9  Hematocrit 36.0 - 46.0 % 41.6 39.0 44.1  Platelets 150 - 400 K/uL 312 - 369    Lab Results  Component Value Date/Time   VD25OH 26.05 (L) 09/24/2020 08:43 AM    Clinical ASCVD: Yes  The ASCVD Risk score (Arnett DK, et al., 2019) failed to calculate for the following reasons:   The patient has a prior MI or stroke diagnosis    Depression screen [facility] 2/9 10/01/2020 02/23/2020 08/07/2019  Decreased Interest 0 3 2  Down, Depressed, Hopeless 0 3 2  PHQ - 2 Score 0 6 4  Altered sleeping 0 2 1  Tired, decreased energy '1 3 3  ' Change in appetite 0 1 2  Feeling bad or failure about yourself  0 3 1  Trouble  concentrating 0 2 3  Moving slowly or fidgety/restless 0 3 3  Suicidal thoughts 0 0 0  PHQ-9 Score '1 20 17  ' Some recent data might be hidden    GAD 7 : Generalized Anxiety Score 10/01/2020 02/23/2020 08/07/2019 11/28/2018  Nervous, Anxious, on Edge '1 3 3 2  ' Control/stop worrying 0 '3 3 1  ' Worry too much - different things 0 '3 2 1  ' Trouble relaxing 0 '3 2 1  ' Restless 0 '1 2 2  ' Easily annoyed or irritable 0 '3 2 1  ' Afraid - awful might happen 0 '2 3 1  ' Total GAD 7 Score '1 18 17 9     ' Social History   Tobacco Use  Smoking Status Every Day   Packs/day: 0.25   Years: 41.00   Pack years: 10.25   Types: Cigarettes  Smokeless Tobacco Never   BP Readings from Last 3 Encounters:  05/13/21 122/72  04/04/21 124/80  01/07/21 (!) 106/58   Pulse Readings from Last 3 Encounters:  05/13/21 66  04/04/21 68  01/07/21 64   Wt Readings from Last 3 Encounters:  05/13/21 233 lb 9.6 oz (106 kg)  04/04/21 228 lb 4 oz (103.5 kg)  01/07/21 231 lb 6.4 oz (105 kg)   BMI Readings from Last 3 Encounters:  05/13/21 44.14 kg/m  04/04/21 43.13 kg/m  01/07/21 43.72 kg/m    Assessment/Interventions: Review of patient past medical history, allergies, medications, health status, including review of consultants reports, laboratory and other test data, was performed as part of comprehensive evaluation and provision of chronic care management services.   SDOH:  (Social Determinants of Health) assessments and interventions performed: Yes   SDOH Screenings   Alcohol Screen: Not on file  Depression (PHQ2-9): Low Risk    PHQ-2 Score: 1  Financial Resource Strain: Low Risk    Difficulty of Paying Living Expenses: Not very hard  Food Insecurity: Not on file  Housing: Not on file  Physical Activity: Not on file  Social Connections: Not on file  Stress: Not on file  Tobacco Use: High Risk   Smoking Tobacco Use: Every Day   Smokeless Tobacco Use: Never   Passive Exposure: Not on file  Transportation  Needs: Not on file    Ryan  Allergies  Allergen Reactions   Metolazone Other (See Comments)    Metabolic mood swings    Medications Reviewed Today     Reviewed by Charlton Haws, [facility] (Pharmacist) on 08/01/21 at 1328  Med List Status: <None>   Medication Order Taking? Sig Documenting Provider Last Dose Status Informant  albuterol (PROVENTIL) (2.5 MG/3ML) 0.083% nebulizer solution [id number] Yes INHALE 1 VIAL VIA NEBULIZER EVERY 6 HOURS AS NEEDED FOR WHEEZING/SHORTNESS OF Alvester Morin, MD Taking Active   ARIPiprazole (ABILIFY) 5 MG tablet [id number] Yes TAKE 1 TABLET BY MOUTH EVERY DAY Ria Bush, MD Taking Active   aspirin EC 81 MG EC tablet [id number] Yes Take 1 tablet (81 mg total) by mouth daily. Swallow whole. Mariel Aloe, MD Taking Active Self  atorvastatin (LIPITOR) 80 MG tablet [id number] Yes TAKE 1 TABLET BY MOUTH EVERYDAY AT BEDTIME Ria Bush, MD Taking Active   benzonatate (TESSALON) 200 MG capsule [id number] Yes Take 1 capsule (200 mg total) by mouth 3 (three) times daily as needed for cough. Swallow whole, to not bite pill Ria Bush, MD Taking Active Self  BREZTRI AEROSPHERE 160-9-4.8 MCG/ACT AERO [id number] Yes INHALE 2 PUFFS INTO LUNGS 2 TIMES DAILY  Patient taking differently: Inhale 2 puffs into the lungs in the morning and at bedtime.   Tanda Rockers, MD Taking Active   Cholecalciferol (VITAMIN D3) 25 MCG (1000 UT) CAPS [id number] Yes Take 1 capsule (1,000 Units total) by mouth daily. Ria Bush, MD Taking Active Self  dextromethorphan-guaiFENesin [facility] DM) 30-600 MG 12hr tablet [id number] Yes Take 1 tablet by mouth 2 (two) times daily as needed for cough. [provider] Taking Active Self  Ferney 24-26 [place] [id number] Yes TAKE 1 TABLET BY MOUTH TWICE A DAY O'Neal, Cassie Freer, MD Taking Active   escitalopram (LEXAPRO) 20 MG tablet [id number] Yes TAKE 1 AND 1/2 TABLETS BY MOUTH DAILY Ria Bush, MD  Taking Active   famotidine (PEPCID) 20 MG tablet [id number] Yes TAKE 1 TABLET BY MOUTH EVERYDAY AT BEDTIME  Patient taking differently: Take 20 mg  by mouth at bedtime.   Tanda Rockers, MD Taking Active   fluticasone [facility]) 50 MCG/ACT nasal spray [id number] Yes PLACE 1 SPRAY INTO BOTH NOSTRILS 2 (TWO) TIMES DAILY Ria Bush, MD Taking Active   furosemide (LASIX) 20 MG tablet [id number] Yes TAKE 1 TABLET BY MOUTH EVERY DAY  Patient taking differently: Take 20 mg by mouth daily.   Geralynn Rile, MD Taking Active   gabapentin (NEURONTIN) 300 MG capsule [id number] Yes TAKE 2 CAPSULES IN THE MORNING AND 3 CAPSULES AT Beverlyn Roux, NP Taking Active   JARDIANCE 10 MG TABS tablet [id number] Yes TAKE 1 TABLET BY MOUTH DAILY BEFORE BREAKFAST. Geralynn Rile, MD Taking Active   LORazepam (ATIVAN) 0.5 MG tablet [id number] Yes TAKE 1/2 TO 1 TABLET BY MOUTH TWICE A DAY AS NEEDED FOR ANXIETY Ria Bush, MD Taking Active   metoprolol succinate (TOPROL-XL) 25 MG 24 hr tablet [id number] Yes TAKE 1 TABLET (25 MG TOTAL) BY MOUTH DAILY. Geralynn Rile, MD Taking Active   Multiple Vitamin (MULTIVITAMIN WITH MINERALS) TABS tablet [id number] Yes Take 1 tablet by mouth daily. [provider] Taking Active Self  nitroGLYCERIN (NITROSTAT) 0.4 MG SL tablet [id number] Yes Place 1 tablet (0.4 mg total) under the tongue every 5 (five) minutes as needed. Cheryln Manly, NP Taking Active            Med Note Orma Render   Tue Jan 07, 2021  2:09 PM) Patient has if needed  nystatin (MYCOSTATIN) 100000 UNIT/ML suspension [id number] Yes TAKE 1 TEASPOONFUL BY MOUTH 3 TIMES DAILY AS NEEDED FOR MOUTH SORES. Melvenia Needles, NP Taking Active   PROAIR RESPICLICK 338 (90 Base) MCG/ACT AEPB [id number] Yes INHALE 2 PUFFS INTO THE LUNGS EVERY 6 HOURS AS NEEDED FOR DYSPNEA/WHEEZE Ria Bush, MD Taking Active   spironolactone (ALDACTONE) 25 MG tablet [id number] Yes TAKE 1/2 TABLET  BY MOUTH EVERY DAY O'Neal, Cassie Freer, MD Taking Active   ticagrelor (BRILINTA) 90 MG TABS tablet [id number] Yes Take 1 tablet (90 mg total) by mouth 2 (two) times daily. Darreld Mclean, PA-C Taking Active             Patient Active Problem List   Diagnosis Date Noted   CAD (coronary artery disease)    History of ST elevation myocardial infarction (STEMI)    Medicare annual wellness visit, initial 10/01/2020   Cannabis dependence ([place]) 04/30/2020   Basilar artery stenosis 02/18/2020   TIA (transient ischemic attack) 02/08/2020   Chronic combined systolic and diastolic CHF (congestive heart failure) ([place]) 02/08/2020   Elevated troponin 02/04/2020   Chronic kidney disease, stage 3a ([place]) 08/07/2019   Knee mass, right 11/28/2018   Advanced care planning/counseling discussion 07/27/2018   Unilateral primary osteoarthritis, left knee 10/28/2017   COPD (chronic obstructive pulmonary disease) ([place]) 10/20/2017   Cor pulmonale (chronic) ([place]) 92/42/6834   Diastolic dysfunction 19/62/2297   Grief 02/24/2017   Chronic respiratory failure with hypoxia ([place]) 07/31/2015   Type 2 diabetes mellitus (HCC)    OSA on CPAP 09/14/2014   Health maintenance examination 09/14/2014   Upper airway cough syndrome 09/22/2013   Chronic pain syndrome 05/30/2013   Leukocytosis 02/22/2008   NONSPECIFIC ABNORMAL ELECTROCARDIOGRAM 02/22/2008   HTN (hypertension) 06/23/2007   HYPERTRIGLYCERIDEMIA 06/01/2007   Morbid obesity with BMI of 40.0-44.9, adult ([place]) 06/01/2007   Mood disorder ([place]) 06/01/2007   Cigarette smoker 06/01/2007   Left leg swelling 05/04/2007    Immunization History  Administered Date(s) Administered  Influenza Whole 04/01/2002   Influenza,inj,Quad PF,6+ Mos 03/21/2015, 07/27/2018, 02/08/2019, 02/23/2020, 04/04/2021   Influenza-Unspecified 03/01/2014, 04/22/2016, 03/25/2017   PFIZER(Purple Top)SARS-COV-2 Vaccination 02/09/2020, 03/06/2020   PNEUMOCOCCAL CONJUGATE-20 10/01/2020    Pneumococcal Polysaccharide-23 04/01/2002, 07/24/2015   Td 06/01/1993   Tdap 02/24/2017    Conditions to be addressed/monitored:  Hypertension, Hyperlipidemia, Diabetes, Heart Failure, Coronary Artery Disease, GERD, COPD, Depression, Anxiety, and Tobacco use  Care Plan : [place]  Updates made by Charlton Haws, [place] since 08/01/2021 12:00 AM     Problem: Hypertension, Hyperlipidemia, Diabetes, Heart Failure, Coronary Artery Disease, GERD, COPD, Depression, Anxiety, and Tobacco use      Long-Range Goal: Disease management   Start Date: 02/07/2021  Expected End Date: 02/07/2022  This Visit's Progress: On track  Recent Progress: On track  Priority: High  Note:   Current Barriers:  Unable to independently monitor therapeutic efficacy Unable to achieve control of smoking   Pharmacist Clinical Goal(s):  Patient will achieve adherence to monitoring guidelines and medication adherence to achieve therapeutic efficacy achieve control of smoking as evidenced by pt report through collaboration with PharmD and provider.   Interventions: 1:1 collaboration with Ria Bush, MD regarding development and update of comprehensive plan of care as evidenced by provider attestation and co-signature Inter-disciplinary care team collaboration (see longitudinal plan of care) Comprehensive medication review performed; medication list updated in electronic medical record  Hyperlipidemia: (LDL goal < 70) -Controlled - LDL was 98 in 08/2020 after being off statin for a few weeks, 49 in 10/2020 s/p STEMI and restarting atorvastatin 40 mg; pt endorses compliance with higher dose statin and denies side effects; she denies bleeding issues on DAPT -Hx STEMI 10/2020; hx TIA 01/2020 -Current treatment: Atorvastatin 80 mg daily - Appropriate, Effective, Safe, Accessible Nitroglycerin 0.4 mg SL prn -Appropriate, Effective, Safe, Accessible Brilinta 90 mg BID -Appropriate, Effective, Safe,  Accessible Aspirin 81 mg daily -Appropriate, Effective, Safe, Accessible -Current dietary patterns: trying to cut back on portion size -Current exercise habits: trying to walk more; limited by COPD/need for O2 -Educated on Cholesterol goals; Benefits of statin for ASCVD risk reduction; -Recommended to continue current medication  Diabetes (A1c goal <7%) -Controlled - A1c most recently 6.3% (Nov 2022) off metformin; A1c has been <6.5% since 04/2019;  -Current home glucose readings fasting glucose: 90-115 -Current medications: Jardiance 10 mg daily -Appropriate, Effective, Safe, Accessible -Educated on A1c and blood sugar goals; Complications of diabetes including kidney damage, retinal damage, and cardiovascular disease; -Recommend to continue current medication  Heart Failure / Hypertension (BP goal < 130/80, prevent exacerbations) -Controlled - BP is at goal and pt denies persistent swelling; she is on GDMT for heart failure -Current home BP/HR readings: 116/78 -Last ejection fraction: 60-65% (Date: 05/2021) -HF type: Combined Systolic and Diastolic -NYHA Class: II (slight limitation of activity) -Current treatment: Furosemide 20 mg daily -Appropriate, Effective, Safe, Accessible Metoprolol succinate 25 mg daily -Appropriate, Effective, Safe, Accessible Entresto 24-26 mg BID -Appropriate, Effective, Safe, Accessible Spironolactone 25 mg - 1/2 tab daily Appropriate, Effective, Safe, Accessible Jardiance 10 mg daily -Appropriate, Effective, Safe, Accessible -Educated on Benefits of medications for managing symptoms and prolonging life; Importance of weighing daily; Importance of blood pressure control -Recommended to continue current medication  COPD (Goal: control symptoms and prevent exacerbations) -Controlled - pt reports overall good control; she uses Proair ~once a day to "open up lungs" before bed; she wears Oxygen most of the time -Gold Grade: Gold 2 (FEV1 50-79%) -Current  COPD Classification:  C (low sx, >/=2 exacerbations/yr) -MMRC/CAT score: not on file -Pulmonary function testing (09/2017): FEV1 50% predicted, FEV1/FVC 0.58 -Exacerbations requiring treatment in last 6 months: 0 -Current treatment  Breztri 160-9-4.8 mcg/act 2 puffs BID -Appropriate, Effective, Safe, Accessible Proair Respiclick PRN - usually before bed -Appropriate, Effective, Safe, Accessible Albuterol 0.083% nebulizer soln PRN - very infrequently -Appropriate, Effective, Safe, Accessible Supplemental O2 -Patient reports consistent use of maintenance inhaler -Frequency of rescue inhaler use: daily or every other day -Counseled on Proper inhaler technique; Benefits of consistent maintenance inhaler use; When to use rescue inhaler -Recommended to continue current medication  Tobacco use (Goal: cessation) -Not ideally controlled - pt is down down to 2 cigarettes per day; she uses cigarette instead of lorazepam for anxiety -Previous quit attempts: has quit before, cold [place]; tried Chantix before -reports MOTIVATION to quit is high -reports CONFIDENCE in quitting is moderate -Counseled on benefits of NRT for cravings; advised to call [place] [facility] for free gum/lozenges; pt is agreeable and says she will call  Depression/Anxiety (Goal: manage symptoms) -Controlled - pt reports mood is "stable"; she does report using cigarettes to help with anxiety more often than lorazepam -Connected with PCP for mental health support -Current treatment: Aripiprazole 5 mg daily - Appropriate, Effective, Safe, Accessible Escitalopram 20 mg - 1.5 tab daily -Appropriate, Effective, Safe, Accessible Lorazepam 0.5 mg - 1/2 to 1 tab BID prn - once a week -Appropriate, Effective, Safe, Accessible -Educated on Benefits of medication for symptom control -Advised it is ok to use lorazepam PRN instead of cigarettes -Recommended to continue current medication  Pain (Goal: manage symptoms) -Controlled - overall  gabapentin helps; she reports occasionally feet burning, numbness for a few minutes; she denies sedation - Lumbar spinal stenosis with neurogenic claudication, lumbar radiculopathy, lumbar spondylolisthesis, adult degenerative scoliosis  -Current treatment  Gabapentin 300 mg - 2 tab AM, 3 tab PM -Appropriate, Effective, Safe, Accessible -Counseled on potential for gabapentin to cause sedation, especially with lorazepam -Recommended to continue current medication  Patient Goals/Self-Care Activities Patient will:  - take medications as prescribed focus on medication adherence by pill box check glucose 2-3x weekly, document, and provide at future appointments check blood pressure daily, document, and provide at future appointments weigh daily, and contact provider if weight gain of 2+ lbs overnight or 5+ lbs in a week      Medication Assistance: None required.  Patient affirms current coverage meets needs. [facility] Dual complete, all meds $0)  Compliance/Adherence/Medication fill history: Care Gaps: Eye exam Colonoscopy Foot exam  Star-Rating Drugs: Atorvatatin - PDC 100% Jardiance - PDC 95%  Patient's preferred pharmacy is:  [id number] [id number]: [phone]: [phone]  Uses pill box? Yes Pt endorses 100% compliance  We discussed: Benefits of medication synchronization, packaging and delivery as well as enhanced pharmacist oversight with [employer]. Patient decided to: Continue current medication management strategy  Care Plan and Follow Up Patient Decision:  Patient agrees to Care Plan and Follow-up.  Plan: Telephone follow up appointment with care management team member scheduled for:  6 months  LCharlene Brooke PharmD, BNardin CPP Clinical Pharmacist [facility] [phone]

## 2021-08-01 NOTE — Patient Instructions (Signed)
Visit Information ? ?Phone number for Pharmacist: [phone] ? ? Goals Addressed   ?None ?  ? ? ?Care Plan : CCM Pharmacy Care Plan  ?Updates made by Kathyrn Sheriff, RPH since 08/01/2021 12:00 AM  ?  ? ?Problem: Hypertension, Hyperlipidemia, Diabetes, Heart Failure, Coronary Artery Disease, GERD, COPD, Depression, Anxiety, and Tobacco use   ?  ? ?Long-Range Goal: Disease management   ?Start Date: 02/07/2021  ?Expected End Date: 02/07/2022  ?This Visit's Progress: On track  ?Recent Progress: On track  ?Priority: High  ?Note:   ?Current Barriers:  ?Unable to independently monitor therapeutic efficacy ?Unable to achieve control of smoking  ? ?Pharmacist Clinical Goal(s):  ?Patient will achieve adherence to monitoring guidelines and medication adherence to achieve therapeutic efficacy ?achieve control of smoking as evidenced by pt report through collaboration with PharmD and provider.  ? ?Interventions: ?1:1 collaboration with [name], MD regarding development and update of comprehensive plan of care as evidenced by provider attestation and co-signature ?Inter-disciplinary care team collaboration (see longitudinal plan of care) ?Comprehensive medication review performed; medication list updated in electronic medical record ? ?Hyperlipidemia: (LDL goal < 70) ?-Controlled - LDL was 98 in 08/2020 after being off statin for a few weeks, 49 in 10/2020 s/p STEMI and restarting atorvastatin 40 mg; pt endorses compliance with higher dose statin and denies side effects; she denies bleeding issues on DAPT ?-Hx STEMI 10/2020; hx TIA 01/2020 ?-Current treatment: ?Atorvastatin 80 mg daily - Appropriate, Effective, Safe, Accessible ?Nitroglycerin 0.4 mg SL prn -Appropriate, Effective, Safe, Accessible ?Brilinta 90 mg BID -Appropriate, Effective, Safe, Accessible ?Aspirin 81 mg daily -Appropriate, Effective, Safe, Accessible ?-Current dietary patterns: trying to cut back on portion size ?-Current exercise habits: trying to  walk more; limited by COPD/need for O2 ?-Educated on Cholesterol goals; Benefits of statin for ASCVD risk reduction; ?-Recommended to continue current medication ? ?Diabetes (A1c goal <7%) ?-Controlled - A1c most recently 6.3% (Nov 2022) off metformin; A1c has been <6.5% since 04/2019;  ?-Current home glucose readings ?fasting glucose: 90-115 ?-Current medications: ?Jardiance 10 mg daily -Appropriate, Effective, Safe, Accessible ?-Educated on A1c and blood sugar goals; Complications of diabetes including kidney damage, retinal damage, and cardiovascular disease; ?-Recommend to continue current medication ? ?Heart Failure / Hypertension (BP goal < 130/80, prevent exacerbations) ?-Controlled - BP is at goal and pt denies persistent swelling; she is on GDMT for heart failure ?-Current home BP/HR readings: 116/78 ?-Last ejection fraction: 60-65% (Date: 05/2021) ?-HF type: Combined Systolic and Diastolic ?-NYHA Class: II (slight limitation of activity) ?-Current treatment: ?Furosemide 20 mg daily -Appropriate, Effective, Safe, Accessible ?Metoprolol succinate 25 mg daily -Appropriate, Effective, Safe, Accessible ?Entresto 24-26 mg BID -Appropriate, Effective, Safe, Accessible ?Spironolactone 25 mg - 1/2 tab daily Appropriate, Effective, Safe, Accessible ?Jardiance 10 mg daily -Appropriate, Effective, Safe, Accessible ?-Educated on Benefits of medications for managing symptoms and prolonging life; Importance of weighing daily; Importance of blood pressure control ?-Recommended to continue current medication ? ?COPD (Goal: control symptoms and prevent exacerbations) ?-Controlled - pt reports overall good control; she uses Proair ~once a day to "open up lungs" before bed; she wears Oxygen most of the time ?-Gold Grade: Gold 2 (FEV1 50-79%) ?-Current COPD Classification:  C (low sx, >/=2 exacerbations/yr) ?-MMRC/CAT score: not on file ?-Pulmonary function testing (09/2017): FEV1 50% predicted, FEV1/FVC 0.58 ?-Exacerbations  requiring treatment in last 6 months: 0 ?-Current treatment  ?Breztri 160-9-4.8 mcg/act 2 puffs BID -Appropriate, Effective, Safe, Accessible ?Proair Respiclick PRN - usually before bed -Appropriate, Effective, Safe,  Accessible ?Albuterol 0.083% nebulizer soln PRN - very infrequently -Appropriate, Effective, Safe, Accessible ?Supplemental O2 ?-Patient reports consistent use of maintenance inhaler ?-Frequency of rescue inhaler use: daily or every other day ?-Counseled on Proper inhaler technique; Benefits of consistent maintenance inhaler use; When to use rescue inhaler ?-Recommended to continue current medication ? ?Tobacco use (Goal: cessation) ?-Not ideally controlled - pt is down down to 2 cigarettes per day; she uses cigarette instead of lorazepam for anxiety ?-Previous quit attempts: has quit before, cold [place]; tried Chantix before ?-reports MOTIVATION to quit is high ?-reports CONFIDENCE in quitting is moderate ?-Counseled on benefits of NRT for cravings; advised to call [place] [facility] for free gum/lozenges; pt is agreeable and says she will call ? ?Depression/Anxiety (Goal: manage symptoms) ?-Controlled - pt reports mood is "stable"; she does report using cigarettes to help with anxiety more often than lorazepam ?-Connected with PCP for mental health support ?-Current treatment: ?Aripiprazole 5 mg daily - Appropriate, Effective, Safe, Accessible ?Escitalopram 20 mg - 1.5 tab daily -Appropriate, Effective, Safe, Accessible ?Lorazepam 0.5 mg - 1/2 to 1 tab BID prn - once a week -Appropriate, Effective, Safe, Accessible ?-Educated on Benefits of medication for symptom control ?-Advised it is ok to use lorazepam PRN instead of cigarettes ?-Recommended to continue current medication ? ?Pain (Goal: manage symptoms) ?-Controlled - overall gabapentin helps; she reports occasionally feet burning, numbness for a few minutes; she denies sedation ?- Lumbar spinal stenosis with neurogenic claudication, lumbar  radiculopathy, lumbar spondylolisthesis, adult degenerative scoliosis  ?-Current treatment  ?Gabapentin 300 mg - 2 tab AM, 3 tab PM -Appropriate, Effective, Safe, Accessible ?-Counseled on potential for gabapentin to cause sedation, especially with lorazepam ?-Recommended to continue current medication ? ?Patient Goals/Self-Care Activities ?Patient will:  ?- take medications as prescribed ?focus on medication adherence by pill box ?check glucose 2-3x weekly, document, and provide at future appointments ?check blood pressure daily, document, and provide at future appointments ?weigh daily, and contact provider if weight gain of 2+ lbs overnight or 5+ lbs in a week ?  ?  ? ?Patient verbalizes understanding of instructions and care plan provided today and agrees to view in [place]. Active MyChart status confirmed with patient.   ?Telephone follow up appointment with pharmacy team member scheduled for: 1 year ? ?Charlene Brooke, PharmD, BCACP ?Clinical Pharmacist ?[facility] ?[phone] ?  ?

## 2021-08-05 DIAGNOSIS — G4733 Obstructive sleep apnea (adult) (pediatric): Secondary | ICD-10-CM | POA: Diagnosis not present

## 2021-08-22 ENCOUNTER — Ambulatory Visit (INDEPENDENT_AMBULATORY_CARE_PROVIDER_SITE_OTHER): Payer: Medicare Other

## 2021-08-22 DIAGNOSIS — G459 Transient cerebral ischemic attack, unspecified: Secondary | ICD-10-CM

## 2021-08-25 LAB — CUP PACEART REMOTE DEVICE CHECK
Date Time Interrogation Session: 20230324230714
Implantable Pulse Generator Implant Date: 20211102

## 2021-08-27 NOTE — Progress Notes (Signed)
Carelink Summary Report / Loop Recorder 

## 2021-08-28 ENCOUNTER — Other Ambulatory Visit: Payer: Self-pay | Admitting: Family Medicine

## 2021-08-28 ENCOUNTER — Other Ambulatory Visit: Payer: Self-pay | Admitting: Internal Medicine

## 2021-08-28 ENCOUNTER — Other Ambulatory Visit: Payer: Self-pay | Admitting: Primary Care

## 2021-08-28 DIAGNOSIS — J449 Chronic obstructive pulmonary disease, unspecified: Secondary | ICD-10-CM

## 2021-08-29 DIAGNOSIS — J449 Chronic obstructive pulmonary disease, unspecified: Secondary | ICD-10-CM

## 2021-08-29 DIAGNOSIS — I5042 Chronic combined systolic (congestive) and diastolic (congestive) heart failure: Secondary | ICD-10-CM

## 2021-08-29 DIAGNOSIS — I10 Essential (primary) hypertension: Secondary | ICD-10-CM

## 2021-08-29 DIAGNOSIS — I2510 Atherosclerotic heart disease of native coronary artery without angina pectoris: Secondary | ICD-10-CM

## 2021-08-29 DIAGNOSIS — E1142 Type 2 diabetes mellitus with diabetic polyneuropathy: Secondary | ICD-10-CM

## 2021-08-29 DIAGNOSIS — J4530 Mild persistent asthma, uncomplicated: Secondary | ICD-10-CM | POA: Diagnosis not present

## 2021-08-29 NOTE — Telephone Encounter (Signed)
ERx 

## 2021-08-29 NOTE — Telephone Encounter (Signed)
Name of Medication: Lorazepam ?Name of Pharmacy: [facility] [address] ?Last Fill or Written Date and Quantity: 06/23/21, #30 ?Last Office Visit and Type: 04/04/21, 6 mo f/u ?Next Office Visit and Type: 10/08/21, CPE ?Last Controlled Substance Agreement Date: none ?Last UDS: none ? ?Abilify last filled:  06/21/21, #90. ? ?Spoke with pt asking about metformin requests.  Pt says she no longer takes med.  I denied request. ? ? ?

## 2021-09-05 DIAGNOSIS — G4733 Obstructive sleep apnea (adult) (pediatric): Secondary | ICD-10-CM | POA: Diagnosis not present

## 2021-09-07 ENCOUNTER — Other Ambulatory Visit: Payer: Self-pay | Admitting: Family Medicine

## 2021-09-07 ENCOUNTER — Other Ambulatory Visit: Payer: Self-pay | Admitting: Cardiovascular Disease

## 2021-09-17 ENCOUNTER — Other Ambulatory Visit: Payer: Self-pay | Admitting: Internal Medicine

## 2021-09-23 ENCOUNTER — Other Ambulatory Visit: Payer: Self-pay | Admitting: Family Medicine

## 2021-09-23 DIAGNOSIS — E1169 Type 2 diabetes mellitus with other specified complication: Secondary | ICD-10-CM

## 2021-09-23 DIAGNOSIS — E781 Pure hyperglyceridemia: Secondary | ICD-10-CM

## 2021-09-23 DIAGNOSIS — N1831 Chronic kidney disease, stage 3a: Secondary | ICD-10-CM

## 2021-09-24 ENCOUNTER — Ambulatory Visit (INDEPENDENT_AMBULATORY_CARE_PROVIDER_SITE_OTHER): Payer: Medicare Other

## 2021-09-24 DIAGNOSIS — G459 Transient cerebral ischemic attack, unspecified: Secondary | ICD-10-CM

## 2021-09-25 LAB — CUP PACEART REMOTE DEVICE CHECK
Date Time Interrogation Session: 20230426230812
Implantable Pulse Generator Implant Date: 20211102

## 2021-10-03 ENCOUNTER — Other Ambulatory Visit (INDEPENDENT_AMBULATORY_CARE_PROVIDER_SITE_OTHER): Payer: Medicare Other

## 2021-10-03 DIAGNOSIS — N1831 Chronic kidney disease, stage 3a: Secondary | ICD-10-CM | POA: Diagnosis not present

## 2021-10-03 DIAGNOSIS — E781 Pure hyperglyceridemia: Secondary | ICD-10-CM | POA: Diagnosis not present

## 2021-10-03 DIAGNOSIS — E1169 Type 2 diabetes mellitus with other specified complication: Secondary | ICD-10-CM

## 2021-10-03 LAB — LIPID PANEL
Cholesterol: 100 mg/dL (ref 0–200)
HDL: 42.3 mg/dL (ref >39.00)
LDL Cholesterol: 36 mg/dL (ref 0–99)
NonHDL: 58
Total CHOL/HDL Ratio: 2
Triglycerides: 112 mg/dL (ref 0.0–149.0)
VLDL: 22.4 mg/dL (ref 0.0–40.0)

## 2021-10-03 LAB — COMPREHENSIVE METABOLIC PANEL
ALT: 20 U/L (ref 0–35)
AST: 15 U/L (ref 0–37)
Albumin: 3.9 g/dL (ref 3.5–5.2)
Alkaline Phosphatase: 111 U/L (ref 39–117)
BUN: 14 mg/dL (ref 6–23)
CO2: 30 mEq/L (ref 19–32)
Calcium: 9.4 mg/dL (ref 8.4–10.5)
Chloride: 103 mEq/L (ref 96–112)
Creatinine, Ser: 0.99 mg/dL (ref 0.40–1.20)
GFR: 60.81 mL/min (ref >60.00)
Glucose, Bld: 125 mg/dL — ABNORMAL HIGH (ref 70–99)
Potassium: 4 mEq/L (ref 3.5–5.1)
Sodium: 142 mEq/L (ref 135–145)
Total Bilirubin: 0.3 mg/dL (ref 0.2–1.2)
Total Protein: 7.4 g/dL (ref 6.0–8.3)

## 2021-10-03 LAB — CBC WITH DIFFERENTIAL/PLATELET
Basophils Absolute: 0 10*3/uL (ref 0.0–0.1)
Basophils Relative: 0.3 % (ref 0.0–3.0)
Eosinophils Absolute: 0.3 10*3/uL (ref 0.0–0.7)
Eosinophils Relative: 2.5 % (ref 0.0–5.0)
HCT: 43.5 % (ref 36.0–46.0)
Hemoglobin: 13.9 g/dL (ref 12.0–15.0)
Lymphocytes Relative: 16.1 % (ref 12.0–46.0)
Lymphs Abs: 2 10*3/uL (ref 0.7–4.0)
MCHC: 32.1 g/dL (ref 30.0–36.0)
MCV: 90.5 fl (ref 78.0–100.0)
Monocytes Absolute: 0.6 10*3/uL (ref 0.1–1.0)
Monocytes Relative: 4.8 % (ref 3.0–12.0)
Neutro Abs: 9.7 10*3/uL — ABNORMAL HIGH (ref 1.4–7.7)
Neutrophils Relative %: 76.3 % (ref 43.0–77.0)
Platelets: 309 10*3/uL (ref 150.0–400.0)
RBC: 4.8 Mil/uL (ref 3.87–5.11)
RDW: 15.7 % — ABNORMAL HIGH (ref 11.5–15.5)
WBC: 12.7 10*3/uL — ABNORMAL HIGH (ref 4.0–10.5)

## 2021-10-03 LAB — VITAMIN D 25 HYDROXY (VIT D DEFICIENCY, FRACTURES): VITD: 30.62 ng/mL (ref 30.00–100.00)

## 2021-10-03 LAB — HEMOGLOBIN A1C: Hgb A1c MFr Bld: 6.5 % (ref 4.6–6.5)

## 2021-10-03 NOTE — Addendum Note (Signed)
Addended by: Donnamarie Poag on: 10/03/2021 09:39 AM ? ? Modules accepted: Orders ? ?

## 2021-10-04 LAB — PARATHYROID HORMONE, INTACT (NO CA): PTH: 84 pg/mL — ABNORMAL HIGH (ref 16–77)

## 2021-10-05 DIAGNOSIS — G4733 Obstructive sleep apnea (adult) (pediatric): Secondary | ICD-10-CM | POA: Diagnosis not present

## 2021-10-06 ENCOUNTER — Other Ambulatory Visit: Payer: Self-pay | Admitting: Family Medicine

## 2021-10-06 NOTE — Telephone Encounter (Addendum)
Message states metformin was stopped on 05/13/21.   ? ?Spoke with pt asking about metformin 500 mg.  Pt confirms she no longer takes metformin.  I denied request.  ? ?Name of Medication: Lorazepam ?Name of Pharmacy: [facility] ?Last Fill or Written Date and Quantity: 09/15/21, #30 ?Last Office Visit and Type: 04/04/21, 6 mo f/u ?Next Office Visit and Type: 10/08/21, CPE ?Last Controlled Substance Agreement Date: none ?Last UDS: none ? ?Lexapro last filled:  09/15/21, #135 ? ? ? ?

## 2021-10-07 NOTE — Telephone Encounter (Signed)
ERx 

## 2021-10-08 ENCOUNTER — Ambulatory Visit (INDEPENDENT_AMBULATORY_CARE_PROVIDER_SITE_OTHER): Payer: Medicare Other | Admitting: Family Medicine

## 2021-10-08 ENCOUNTER — Encounter: Payer: Self-pay | Admitting: Family Medicine

## 2021-10-08 VITALS — BP 128/78 | HR 77 | Temp 97.9°F | Ht 60.0 in | Wt 239.5 lb

## 2021-10-08 DIAGNOSIS — E781 Pure hyperglyceridemia: Secondary | ICD-10-CM

## 2021-10-08 DIAGNOSIS — J449 Chronic obstructive pulmonary disease, unspecified: Secondary | ICD-10-CM

## 2021-10-08 DIAGNOSIS — D72829 Elevated white blood cell count, unspecified: Secondary | ICD-10-CM

## 2021-10-08 DIAGNOSIS — Z0000 Encounter for general adult medical examination without abnormal findings: Secondary | ICD-10-CM | POA: Diagnosis not present

## 2021-10-08 DIAGNOSIS — I10 Essential (primary) hypertension: Secondary | ICD-10-CM

## 2021-10-08 DIAGNOSIS — E559 Vitamin D deficiency, unspecified: Secondary | ICD-10-CM | POA: Insufficient documentation

## 2021-10-08 DIAGNOSIS — F39 Unspecified mood [affective] disorder: Secondary | ICD-10-CM

## 2021-10-08 DIAGNOSIS — H10022 Other mucopurulent conjunctivitis, left eye: Secondary | ICD-10-CM

## 2021-10-08 DIAGNOSIS — I252 Old myocardial infarction: Secondary | ICD-10-CM

## 2021-10-08 DIAGNOSIS — F17210 Nicotine dependence, cigarettes, uncomplicated: Secondary | ICD-10-CM

## 2021-10-08 DIAGNOSIS — N1831 Chronic kidney disease, stage 3a: Secondary | ICD-10-CM

## 2021-10-08 DIAGNOSIS — I2510 Atherosclerotic heart disease of native coronary artery without angina pectoris: Secondary | ICD-10-CM

## 2021-10-08 DIAGNOSIS — G459 Transient cerebral ischemic attack, unspecified: Secondary | ICD-10-CM

## 2021-10-08 DIAGNOSIS — Z1211 Encounter for screening for malignant neoplasm of colon: Secondary | ICD-10-CM

## 2021-10-08 DIAGNOSIS — Z7189 Other specified counseling: Secondary | ICD-10-CM

## 2021-10-08 DIAGNOSIS — H10029 Other mucopurulent conjunctivitis, unspecified eye: Secondary | ICD-10-CM | POA: Insufficient documentation

## 2021-10-08 DIAGNOSIS — E1169 Type 2 diabetes mellitus with other specified complication: Secondary | ICD-10-CM

## 2021-10-08 DIAGNOSIS — I5042 Chronic combined systolic (congestive) and diastolic (congestive) heart failure: Secondary | ICD-10-CM

## 2021-10-08 DIAGNOSIS — I651 Occlusion and stenosis of basilar artery: Secondary | ICD-10-CM

## 2021-10-08 DIAGNOSIS — H1030 Unspecified acute conjunctivitis, unspecified eye: Secondary | ICD-10-CM | POA: Insufficient documentation

## 2021-10-08 MED ORDER — ARIPIPRAZOLE 10 MG PO TABS: 10.0000 mg | ORAL_TABLET | Freq: Every day | ORAL | 3 refills | Status: DC

## 2021-10-08 NOTE — Assessment & Plan Note (Signed)
Previous 2 cigarette/day smoker, she has fully quit in the past week.  Congratulated. ?Discussed lung cancer screening referral, she declines. ?

## 2021-10-08 NOTE — Patient Instructions (Addendum)
Pass by lab to pick up stool kit.  ?Increase vitamin D to 2000 units daily  ?Increase abilify to 53m daily, update me with effect.  ?Let me know when ready for lung cancer screening program referral.  ?If interested, check with pharmacy about new 2 shot shingles series (shingrix).  ?Schedule eye exam. If too long a wait, you could have diabetic eye screen done at our office, let [place] if interested.  ?Use lubricating eye drops for L eye, warm compresses.  ?Return as needed or in 6 months for diabetes check.  ? ?Health Maintenance for Postmenopausal Women ?Menopause is a normal process in which your ability to get pregnant comes to an end. This process happens slowly over many months or years, usually between the ages of 468and 574 Menopause is complete when you have missed your menstrual period for 12 months. ?It is important to talk with your health care provider about some of the most common conditions that affect women after menopause (postmenopausal women). These include heart disease, cancer, and bone loss (osteoporosis). Adopting a healthy lifestyle and getting preventive care can help to promote your health and wellness. The actions you take can also lower your chances of developing some of these common conditions. ?What are the signs and symptoms of menopause? ?During menopause, you may have the following symptoms: ?Hot flashes. These can be moderate or severe. ?Night sweats. ?Decrease in sex drive. ?Mood swings. ?Headaches. ?Tiredness (fatigue). ?Irritability. ?Memory problems. ?Problems falling asleep or staying asleep. ?Talk with your health care provider about treatment options for your symptoms. ?Do I need hormone replacement therapy? ?Hormone replacement therapy is effective in treating symptoms that are caused by menopause, such as hot flashes and night sweats. ?Hormone replacement carries certain risks, especially as you become older. If you are thinking about using estrogen or estrogen with  progestin, discuss the benefits and risks with your health care provider. ?How can I reduce my risk for heart disease and stroke? ?The risk of heart disease, heart attack, and stroke increases as you age. One of the causes may be a change in the body's hormones during menopause. This can affect how your body uses dietary fats, triglycerides, and cholesterol. Heart attack and stroke are medical emergencies. There are many things that you can do to help prevent heart disease and stroke. ?Watch your blood pressure ?High blood pressure causes heart disease and increases the risk of stroke. This is more likely to develop in people who have high blood pressure readings or are overweight. ?Have your blood pressure checked: ?Every 3-5 years if you are 12889years of age. ?Every year if you are 450years old or older. ?Eat a healthy diet ? ?Eat a diet that includes plenty of vegetables, fruits, low-fat dairy products, and lean protein. ?Do not eat a lot of foods that are high in solid fats, added sugars, or sodium. ?Get regular exercise ?Get regular exercise. This is one of the most important things you can do for your health. Most adults should: ?Try to exercise for at least 150 minutes each week. The exercise should increase your heart rate and make you sweat (moderate-intensity exercise). ?Try to do strengthening exercises at least twice each week. Do these in addition to the moderate-intensity exercise. ?Spend less time sitting. Even light physical activity can be beneficial. ?Other tips ?Work with your health care provider to achieve or maintain a healthy weight. ?Do not use any products that contain nicotine or tobacco. These products include cigarettes, chewing  tobacco, and vaping devices, such as e-cigarettes. If you need help quitting, ask your health care provider. ?Know your numbers. Ask your health care provider to check your cholesterol and your blood sugar (glucose). Continue to have your blood tested as  directed by your health care provider. ?Do I need screening for cancer? ?Depending on your health history and family history, you may need to have cancer screenings at different stages of your life. This may include screening for: ?Breast cancer. ?Cervical cancer. ?Lung cancer. ?Colorectal cancer. ?What is my risk for osteoporosis? ?After menopause, you may be at increased risk for osteoporosis. Osteoporosis is a condition in which bone destruction happens more quickly than new bone creation. To help prevent osteoporosis or the bone fractures that can happen because of osteoporosis, you may take the following actions: ?If you are 53-78 years old, get at least 1,000 mg of calcium and at least 600 international units (IU) of vitamin D per day. ?If you are older than age 76 but younger than age 73, get at least 1,200 mg of calcium and at least 600 international units (IU) of vitamin D per day. ?If you are older than age 62, get at least 1,200 mg of calcium and at least 800 international units (IU) of vitamin D per day. ?Smoking and drinking excessive alcohol increase the risk of osteoporosis. Eat foods that are rich in calcium and vitamin D, and do weight-bearing exercises several times each week as directed by your health care provider. ?How does menopause affect my mental health? ?Depression may occur at any age, but it is more common as you become older. Common symptoms of depression include: ?Feeling depressed. ?Changes in sleep patterns. ?Changes in appetite or eating patterns. ?Feeling an overall lack of motivation or enjoyment of activities that you previously enjoyed. ?Frequent crying spells. ?Talk with your health care provider if you think that you are experiencing any of these symptoms. ?General instructions ?See your health care provider for regular wellness exams and vaccines. This may include: ?Scheduling regular health, dental, and eye exams. ?Getting and maintaining your vaccines. These  include: ?Influenza vaccine. Get this vaccine each year before the flu season begins. ?Pneumonia vaccine. ?Shingles vaccine. ?Tetanus, diphtheria, and pertussis (Tdap) booster vaccine. ?Your health care provider may also recommend other immunizations. ?Tell your health care provider if you have ever been abused or do not feel safe at home. ?Summary ?Menopause is a normal process in which your ability to get pregnant comes to an end. ?This condition causes hot flashes, night sweats, decreased interest in sex, mood swings, headaches, or lack of sleep. ?Treatment for this condition may include hormone replacement therapy. ?Take actions to keep yourself healthy, including exercising regularly, eating a healthy diet, watching your weight, and checking your blood pressure and blood sugar levels. ?Get screened for cancer and depression. Make sure that you are up to date with all your vaccines. ?This information is not intended to replace advice given to you by your health care provider. Make sure you discuss any questions you have with your health care provider. ?Document Revised: 10/07/2020 Document Reviewed: 10/07/2020 ?Elsevier Patient Education ? [place]. ? ?

## 2021-10-08 NOTE — Assessment & Plan Note (Signed)
Continue aspirin and high-dose statin ?

## 2021-10-08 NOTE — Assessment & Plan Note (Signed)
Weight gain noted.  

## 2021-10-08 NOTE — Assessment & Plan Note (Addendum)
History of MDD versus bipolar, patient has declined psychiatry evaluation. ?Overall stable period on current regimen of Lexapro, Abilify and lorazepam as needed.  However she notices increased stressors affecting mood, predominantly increased anxiety.  We will increase Abilify to 10 mg daily, update with effect. ?

## 2021-10-08 NOTE — Assessment & Plan Note (Signed)
Chronic, stable on current regimen of high dose atorvastatin. ?The ASCVD Risk score (Arnett DK, et al., 2019) failed to calculate for the following reasons: ?  The patient has a prior MI or stroke diagnosis ?

## 2021-10-08 NOTE — Assessment & Plan Note (Signed)
Vitamin D level is borderline low on 1000 units daily.  Will increase to 2000 units daily OTC oral replacement. ?

## 2021-10-08 NOTE — Assessment & Plan Note (Signed)
Anticipate pinkeye to left eye.  Not consistent with bacterial conjunctivitis.  Supportive measures including lubricating eyedrops and warm compresses recommended. ?

## 2021-10-08 NOTE — Assessment & Plan Note (Signed)
Appreciate cardiology care.  °

## 2021-10-08 NOTE — Assessment & Plan Note (Signed)
Advanced directives received, scanned 08/2019. Children Cay Schillings, Domingo Dimes are HCPOA. Ok with temporary trial of intubation, but prolonged life support if terminal condition. ?

## 2021-10-08 NOTE — Assessment & Plan Note (Signed)
Chronic, remains elevated. ?

## 2021-10-08 NOTE — Assessment & Plan Note (Signed)
Chronic, severe, oxygen dependent.  She quit smoking 1 week ago.  Continue pulm follow-up.  She is overdue-she will call and schedule appointment with Dr. Melvyn Novas. ?

## 2021-10-08 NOTE — Assessment & Plan Note (Signed)
History of this, continue aspirin, high-dose statin, and Brilinta. ?

## 2021-10-08 NOTE — Progress Notes (Signed)
? ? Patient ID: Brenda [name], female    DOB: Jan 05, 1961, 61 y.o.   MRN: [id number] ? ?This visit was conducted in person. ? ?BP 128/78   Pulse 77   Temp 97.9 ?F (36.6 ?C) (Temporal)   Ht 5' (1.524 m)   Wt 239 lb 8 oz (108.6 kg)   LMP 03/08/2013   SpO2 97% Comment: 2 L  BMI 46.77 kg/m?   ? ?CC: AMW ?Subjective:  ? ?HPI: ?Brenda [name] is a 61 y.o. female presenting on 10/08/2021 for Medicare Wellness ? ? ?Did not see health advisor this year.  ? ?Hearing Screening  ? 500Hz  1000Hz  2000Hz  4000Hz   ?Right ear 25 20 20 20   ?Left ear 25 20 20 25   ? ?Vision Screening  ? Right eye Left eye Both eyes  ?Without correction 20/30 20/30 20/30   ?With correction     ?  ?Flowsheet Row Office Visit from 10/08/2021 in [facility]  ?PHQ-2 Total Score 1  ? ?  ?  ? ?  10/08/2021  ? 11:18 AM 10/01/2020  ? 11:07 AM  ?Fall Risk   ?Falls in the past year? 0 0  ? ? ?Stressful year.  ?Left eye red eye itchy and swollen for the past week. Treating with pink eye relief OTC drops.  ?O2 dependent asthmatic bronchitis/COPD followed by pulm. Due to see Dr .  ?  ?Smoking - continues 2 cigarettes/day - s/p chantix trial. No cigarette in a week!  ? ?Hospitalization summer 2021 with NSTEMI vs demand ischemia in setting of RSV pneumonia and COPD exacerbation, then re-hospitalized 01/2020 with acute L sided weakness, slurred speech, n/v - found to have basilar artery stenosis with improvement on its own so no stent placed (TIA) - completed DAPT x3 months. Cryptogenic - has implantable monitor.  ?  ?Sees cardiology - thought 12/2019 event was takotsubo cardiomyopathy not true MI.  ? ?Suffered inferior STEMI 10/2020 s/p PCI to distal RCA, planned aspirin + brilinta x1 year then transition to aspirin alone. Continues entresto, metoprolol, jardiance, lasix and aldactone.  ? ?Last visit for worsening mood we started abilify 5mg  daily in addition to lexapro 20mg  daily and referred to psychiatry in h/o bipolar diagnosis -  she did not  go. Continues lorazepam 0.5mg  PRN at night.  ?  ?Preventative: ?Colon cancer screening - iFOB normal 07/2019, desires continued screen ?Well woman exam - never abnormal cervical screen, normal 08/2014, 09/2015, 07/2019. H/o BTL. Referred to GYN for abnormal cervical exam - exam normal on their evaluation.  ?[facility]  ?Lung cancer screening - 30+ PY hx - declines for now ?Flu yearly  ?COVID vaccine Pfizdr 01/2020, 03/2020, no booster ?Pneumovax 2003, 07/2015, prevnar-20 09/2020 ?Tdap 2018.  ?Shingrix - discussed.  ?Advanced directives received, scanned 08/2019. Children 10/2015, 08/2019 are HCPOA. Ok with temporary trial of intubation, but prolonged life support if terminal condition. ?Seat belt use discussed  ?Sunscreen use discussed, no changing moles on skin  ?Smoking 2 cig/day  ?Alcohol - rare  ?Dentist yearly - upper dentures ?Eye exam - due. ? 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   ? ?Relevant past medical, surgical, family and social history reviewed and updated as indicated. Interim medical history since our last visit reviewed. ?Allergies and medications reviewed and updated. ?Outpatient Medications Prior to Visit  ?Medication Sig Dispense Refill  ?  albuterol (PROVENTIL) (2.5 MG/3ML) 0.083% nebulizer solution INHALE 1 VIAL VIA NEBULIZER EVERY 6 HOURS AS NEEDED FOR WHEEZING/SHORTNESS OF BREATH 150 mL 1  ? aspirin EC 81 MG EC tablet Take 1 tablet (81 mg total) by mouth daily. Swallow whole. 30 tablet 0  ? atorvastatin (LIPITOR) 80 MG tablet TAKE 1 TABLET BY MOUTH EVERYDAY AT BEDTIME 90 tablet 0  ? benzonatate (TESSALON) 200 MG capsule Take 1 capsule (200 mg total) by mouth 3 (three) times daily as needed for cough. Swallow whole, to not bite pill 45 capsule 2  ? Budeson-Glycopyrrol-Formoterol (BREZTRI AEROSPHERE) 160-9-4.8 MCG/ACT AERO  INHALE 2 PUFFS INTO LUNGS 2 TIMES DAILY 5.9 g 11  ? Cholecalciferol (VITAMIN D3) 25 MCG (1000 UT) CAPS Take 1 capsule (1,000 Units total) by mouth daily. 30 capsule   ? dextromethorphan-guaiFENesin (MUCINEX DM) 30-600 MG 12hr tablet Take 1 tablet by mouth 2 (two) times daily as needed for cough.    ? ENTRESTO 24-26 MG TAKE 1 TABLET BY MOUTH TWICE A DAY 180 tablet 1  ? escitalopram (LEXAPRO) 20 MG tablet TAKE 1 AND 1/2 TABLETS DAILY BY MOUTH 135 tablet 1  ? fluticasone (FLONASE) 50 MCG/ACT nasal spray PLACE 1 SPRAY INTO BOTH NOSTRILS 2 (TWO) TIMES DAILY 48 mL 0  ? furosemide (LASIX) 20 MG tablet TAKE 1 TABLET BY MOUTH EVERY DAY 90 tablet 3  ? gabapentin (NEURONTIN) 300 MG capsule TAKE 2 CAPSULES IN THE MORNING AND 3 CAPSULES AT NIGHT 450 capsule 0  ? JARDIANCE 10 MG TABS tablet TAKE 1 TABLET BY MOUTH DAILY BEFORE BREAKFAST. 90 tablet 3  ? LORazepam (ATIVAN) 0.5 MG tablet TAKE 1/2 TO 1 TABLET BY MOUTH TWICE A DAY AS NEEDED FOR ANXIETY 30 tablet 0  ? metoprolol succinate (TOPROL-XL) 25 MG 24 hr tablet TAKE 1 TABLET (25 MG TOTAL) BY MOUTH DAILY. 90 tablet 1  ? Multiple Vitamin (MULTIVITAMIN WITH MINERALS) TABS tablet Take 1 tablet by mouth daily.    ? nitroGLYCERIN (NITROSTAT) 0.4 MG SL tablet Place 1 tablet (0.4 mg total) under the tongue every 5 (five) minutes as needed. 25 tablet 2  ? nystatin (MYCOSTATIN) 100000 UNIT/ML suspension TAKE 1 TEASPOONFUL BY MOUTH 3 TIMES DAILY AS NEEDED FOR MOUTH SORES. 150 mL 1  ? PROAIR RESPICLICK 108 (90 Base) MCG/ACT AEPB INHALE 2 PUFFS INTO THE LUNGS EVERY 6 HOURS AS NEEDED FOR DYSPNEA/WHEEZE 1 each 1  ? spironolactone (ALDACTONE) 25 MG tablet TAKE 1/2 TABLET BY MOUTH EVERY DAY 45 tablet 3  ? ticagrelor (BRILINTA) 90 MG TABS tablet Take 1 tablet (90 mg total) by mouth 2 (two) times daily. 180 tablet 2  ? ARIPiprazole (ABILIFY) 5 MG tablet TAKE 1 TABLET BY MOUTH EVERY DAY 90 tablet 1  ? famotidine (PEPCID) 20 MG tablet TAKE 1 TABLET BY MOUTH EVERYDAY AT BEDTIME (Patient taking  differently: Take 20 mg by mouth at bedtime.) 90 tablet 0  ? ?No facility-administered medications prior to visit.  ?  ? ?Per HPI unless specifically indicated in ROS section below ?Review of Systems  ?Constitutional:  Positive for appetite change. Negative for activity change, chills, fatigue, fever and unexpected weight change.  ?HENT:  Negative for hearing loss.   ?Eyes:  Negative for visual disturbance.  ?Respiratory:  Positive for cough, chest tightness (occ), shortness of breath and wheezing.   ?Cardiovascular:  Positive for leg swelling (occ). Negative for chest pain and palpitations.  ?Gastrointestinal:  Negative for abdominal distention, abdominal pain, blood in stool, constipation, diarrhea, nausea and vomiting.  ?  Genitourinary:  Negative for difficulty urinating and hematuria.  ?Musculoskeletal:  Negative for arthralgias, myalgias and neck pain.  ?Skin:  Negative for rash.  ?Neurological:  Negative for dizziness, seizures, syncope and headaches.  ?Hematological:  Negative for adenopathy. Does not bruise/bleed easily.  ?Psychiatric/Behavioral:  Negative for dysphoric mood. The patient is nervous/anxious.   ? ?Objective:  ?BP 128/78   Pulse 77   Temp 97.9 ?F (36.6 ?C) (Temporal)   Ht 5' (1.524 m)   Wt 239 lb 8 oz (108.6 kg)   LMP 03/08/2013   SpO2 97% Comment: 2 L  BMI 46.77 kg/m?   ?Wt Readings from Last 3 Encounters:  ?10/08/21 239 lb 8 oz (108.6 kg)  ?05/13/21 233 lb 9.6 oz (106 kg)  ?04/04/21 228 lb 4 oz (103.5 kg)  ?  ?  ?Physical Exam ?Vitals and nursing note reviewed.  ?Constitutional:   ?   Appearance: Normal appearance. She is not ill-appearing.  ?HENT:  ?   Head: Normocephalic and atraumatic.  ?   Right Ear: Tympanic membrane, ear canal and external ear normal. There is no impacted cerumen.  ?   Left Ear: Tympanic membrane, ear canal and external ear normal. There is no impacted cerumen.  ?Eyes:  ?   General:     ?   Right eye: No discharge.     ?   Left eye: No discharge.  ?   Extraocular  Movements: Extraocular movements intact.  ?   Conjunctiva/sclera: Conjunctivae normal.  ?   Pupils: Pupils are equal, round, and reactive to light.  ?Neck:  ?   Thyroid: No thyroid mass or thyromegaly.  ?   Vascular: No c

## 2021-10-08 NOTE — Assessment & Plan Note (Signed)
Recent kidney function stable. ?

## 2021-10-08 NOTE — Assessment & Plan Note (Signed)

## 2021-10-08 NOTE — Assessment & Plan Note (Signed)
Status post inferior ST elevation MI June 2022 status post drug-eluting stent to RCA.  Continue high-dose atorvastatin, aspirin and Brilinta, continue cardiology follow-up. ?

## 2021-10-08 NOTE — Assessment & Plan Note (Signed)
Chronic, stable on current regimen.  

## 2021-10-08 NOTE — Assessment & Plan Note (Signed)
Chronic, stable period only on Jardiance.  Metformin was discontinued last year, A1c remains well controlled at 6.5%. ?

## 2021-10-08 NOTE — Assessment & Plan Note (Signed)
Preventative protocols reviewed and updated unless pt declined. Discussed healthy diet and lifestyle.  

## 2021-10-09 NOTE — Progress Notes (Signed)
Carelink Summary Report / Loop Recorder 

## 2021-10-10 ENCOUNTER — Other Ambulatory Visit: Payer: Self-pay | Admitting: Family Medicine

## 2021-10-15 ENCOUNTER — Ambulatory Visit (INDEPENDENT_AMBULATORY_CARE_PROVIDER_SITE_OTHER): Payer: Medicare Other

## 2021-10-15 VITALS — Ht 61.0 in | Wt 239.0 lb

## 2021-10-15 DIAGNOSIS — Z0000 Encounter for general adult medical examination without abnormal findings: Secondary | ICD-10-CM

## 2021-10-15 NOTE — Patient Instructions (Signed)
Brenda [name] , ?Thank you for taking time to come for your Medicare Wellness Visit. I appreciate your ongoing commitment to your health goals. Please review the following plan we discussed and let me know if I can assist you in the future.  ? ?Screening recommendations/referrals: ?Colonoscopy: FOBT ordered 10/08/2021. ?Mammogram: Done 10/05/2020 Repeat annually ? ?Bone Density: Due at age 61. ? ?Recommended yearly ophthalmology/optometry visit for glaucoma screening and checkup ?Recommended yearly dental visit for hygiene and checkup ? ?Vaccinations: ?Influenza vaccine: Done 04/04/2021 Repeat annually ? ?Pneumococcal vaccine: Done 10/01/2020 and 07/24/2015 ?Tdap vaccine: Done 02/24/2017 Repeat in 10 years ? ?Shingles vaccine: Discussed.  ?Covid-19: Done 02/09/2020 and 03/06/2020 ? ?Advanced directives: Copy scanned into chart. ? ?Conditions/risks identified: Aim for 30 minutes of exercise or brisk walking, 6-8 glasses of water, and 5 servings of fruits and vegetables each day. ?KEEP UP THE GOOD WORK!!! ? ?Next appointment: Follow up in one year for your annual wellness visit. 2024 ? ?Preventive Care 40-64 Years, Female ?Preventive care refers to lifestyle choices and visits with your health care provider that can promote health and wellness. ?What does preventive care include? ?A yearly physical exam. This is also called an annual well check. ?Dental exams once or twice a year. ?Routine eye exams. Ask your health care provider how often you should have your eyes checked. ?Personal lifestyle choices, including: ?Daily care of your teeth and gums. ?Regular physical activity. ?Eating a healthy diet. ?Avoiding tobacco and drug use. ?Limiting alcohol use. ?Practicing safe sex. ?Taking low-dose aspirin daily starting at age 73. ?Taking vitamin and mineral supplements as recommended by your health care provider. ?What happens during an annual well check? ?The services and screenings done by your health care provider during your annual  well check will depend on your age, overall health, lifestyle risk factors, and family history of disease. ?Counseling  ?Your health care provider may ask you questions about your: ?Alcohol use. ?Tobacco use. ?Drug use. ?Emotional well-being. ?Home and relationship well-being. ?Sexual activity. ?Eating habits. ?Work and work Statistician. ?Method of birth control. ?Menstrual cycle. ?Pregnancy history. ?Screening  ?You may have the following tests or measurements: ?Height, weight, and BMI. ?Blood pressure. ?Lipid and cholesterol levels. These may be checked every 5 years, or more frequently if you are over 81 years old. ?Skin check. ?Lung cancer screening. You may have this screening every year starting at age 22 if you have a 30-pack-year history of smoking and currently smoke or have quit within the past 15 years. ?Fecal occult blood test (FOBT) of the stool. You may have this test every year starting at age 13. ?Flexible sigmoidoscopy or colonoscopy. You may have a sigmoidoscopy every 5 years or a colonoscopy every 10 years starting at age 76. ?Hepatitis C blood test. ?Hepatitis B blood test. ?Sexually transmitted disease (STD) testing. ?Diabetes screening. This is done by checking your blood sugar (glucose) after you have not eaten for a while (fasting). You may have this done every 1-3 years. ?Mammogram. This may be done every 1-2 years. Talk to your health care provider about when you should start having regular mammograms. This may depend on whether you have a family history of breast cancer. ?BRCA-related cancer screening. This may be done if you have a family history of breast, ovarian, tubal, or peritoneal cancers. ?Pelvic exam and Pap test. This may be done every 3 years starting at age 4. Starting at age 52, this may be done every 5 years if you have a Pap test in  combination with an HPV test. ?Bone density scan. This is done to screen for osteoporosis. You may have this scan if you are at high risk for  osteoporosis. ?Discuss your test results, treatment options, and if necessary, the need for more tests with your health care provider. ?Vaccines  ?Your health care provider may recommend certain vaccines, such as: ?Influenza vaccine. This is recommended every year. ?Tetanus, diphtheria, and acellular pertussis (Tdap, Td) vaccine. You may need a Td booster every 10 years. ?Zoster vaccine. You may need this after age 65. ?Pneumococcal 13-valent conjugate (PCV13) vaccine. You may need this if you have certain conditions and were not previously vaccinated. ?Pneumococcal polysaccharide (PPSV23) vaccine. You may need one or two doses if you smoke cigarettes or if you have certain conditions. ?Talk to your health care provider about which screenings and vaccines you need and how often you need them. ?This information is not intended to replace advice given to you by your health care provider. Make sure you discuss any questions you have with your health care provider. ?Document Released: 06/14/2015 Document Revised: 02/05/2016 Document Reviewed: 03/19/2015 ?Elsevier Interactive Patient Education ? 2017 [employer] ? ? ? ?Fall Prevention in the Home ?Falls can cause injuries. They can happen to people of all ages. There are many things you can do to make your home safe and to help prevent falls. ?What can I do on the outside of my home? ?Regularly fix the edges of walkways and driveways and fix any cracks. ?Remove anything that might make you trip as you walk through a door, such as a raised step or threshold. ?Trim any bushes or trees on the path to your home. ?Use bright outdoor lighting. ?Clear any walking paths of anything that might make someone trip, such as rocks or tools. ?Regularly check to see if handrails are loose or broken. Make sure that both sides of any steps have handrails. ?Any raised decks and porches should have guardrails on the edges. ?Have any leaves, snow, or ice cleared regularly. ?Use sand or  salt on walking paths during winter. ?Clean up any spills in your garage right away. This includes oil or grease spills. ?What can I do in the bathroom? ?Use night lights. ?Install grab bars by the toilet and in the tub and shower. Do not use towel bars as grab bars. ?Use non-skid mats or decals in the tub or shower. ?If you need to sit down in the shower, use a plastic, non-slip stool. ?Keep the floor dry. Clean up any water that spills on the floor as soon as it happens. ?Remove soap buildup in the tub or shower regularly. ?Attach bath mats securely with double-sided non-slip rug tape. ?Do not have throw rugs and other things on the floor that can make you trip. ?What can I do in the bedroom? ?Use night lights. ?Make sure that you have a light by your bed that is easy to reach. ?Do not use any sheets or blankets that are too big for your bed. They should not hang down onto the floor. ?Have a firm chair that has side arms. You can use this for support while you get dressed. ?Do not have throw rugs and other things on the floor that can make you trip. ?What can I do in the kitchen? ?Clean up any spills right away. ?Avoid walking on wet floors. ?Keep items that you use a lot in easy-to-reach places. ?If you need to reach something above you, use a strong step stool  that has a grab bar. ?Keep electrical cords out of the way. ?Do not use floor polish or wax that makes floors slippery. If you must use wax, use non-skid floor wax. ?Do not have throw rugs and other things on the floor that can make you trip. ?What can I do with my stairs? ?Do not leave any items on the stairs. ?Make sure that there are handrails on both sides of the stairs and use them. Fix handrails that are broken or loose. Make sure that handrails are as long as the stairways. ?Check any carpeting to make sure that it is firmly attached to the stairs. Fix any carpet that is loose or worn. ?Avoid having throw rugs at the top or bottom of the stairs. If  you do have throw rugs, attach them to the floor with carpet tape. ?Make sure that you have a light switch at the top of the stairs and the bottom of the stairs. If you do not have them, ask someone to a

## 2021-10-15 NOTE — Progress Notes (Signed)
Subjective:   Brenda [name] is a 61 y.o. female who presents for an Initial Medicare Annual Wellness Visit. Virtual Visit via Telephone Note  I connected with  Brenda [name] on 10/15/21 at 12:00 PM EDT by telephone and verified that I am speaking with the correct person using two identifiers.  Location: Patient: HOME Provider: [facility] Persons participating in the virtual visit: patient/Nurse Health Advisor   I discussed the limitations, risks, security and privacy concerns of performing an evaluation and management service by telephone and the availability of in person appointments. The patient expressed understanding and agreed to proceed.  Interactive audio and video telecommunications were attempted between this nurse and patient, however failed, due to patient having technical difficulties OR patient did not have access to video capability.  We continued and completed visit with audio only.  Some vital signs may be absent or patient reported.   [name], LPN  Review of Systems     Cardiac Risk Factors include: diabetes mellitus;hypertension;dyslipidemia;sedentary lifestyle;obesity (BMI >30kg/m2)     Objective:    Today's Vitals   10/15/21 1159  Weight: 239 lb (108.4 kg)  Height: 5\' 1"  (1.549 m)   Body mass index is 45.16 kg/m.     10/15/2021   12:06 PM 11/03/2020    5:00 AM 04/02/2020   10:39 AM 02/07/2020   10:33 PM 01/30/2020   10:21 AM 05/03/2019    4:08 PM 07/05/2017    4:05 PM  Advanced Directives  Does Patient Have a Medical Advance Directive? Yes No Yes No No No Yes  Type of Paramedic of [place];Living will      [place];Living will  Does patient want to make changes to medical advance directive?       No - Patient declined  Copy of [place] in Chart? Yes - validated most recent copy scanned in chart (See row information)      No - copy requested  Would patient like information on creating  a medical advance directive?  No - Patient declined  No - Patient declined No - Patient declined      Current Medications (verified) Outpatient Encounter Medications as of 10/15/2021  Medication Sig   albuterol (PROVENTIL) (2.5 MG/3ML) 0.083% nebulizer solution INHALE 1 VIAL VIA NEBULIZER EVERY 6 HOURS AS NEEDED FOR WHEEZING/SHORTNESS OF BREATH   ARIPiprazole (ABILIFY) 10 MG tablet Take 1 tablet (10 mg total) by mouth daily.   aspirin EC 81 MG EC tablet Take 1 tablet (81 mg total) by mouth daily. Swallow whole.   atorvastatin (LIPITOR) 80 MG tablet TAKE 1 TABLET BY MOUTH EVERYDAY AT BEDTIME   benzonatate (TESSALON) 200 MG capsule Take 1 capsule (200 mg total) by mouth 3 (three) times daily as needed for cough. Swallow whole, to not bite pill   Budeson-Glycopyrrol-Formoterol (BREZTRI AEROSPHERE) 160-9-4.8 MCG/ACT AERO INHALE 2 PUFFS INTO LUNGS 2 TIMES DAILY   Cholecalciferol (VITAMIN D3) 25 MCG (1000 UT) CAPS Take 1 capsule (1,000 Units total) by mouth daily.   dextromethorphan-guaiFENesin (MUCINEX DM) 30-600 MG 12hr tablet Take 1 tablet by mouth 2 (two) times daily as needed for cough.   ENTRESTO 24-26 MG TAKE 1 TABLET BY MOUTH TWICE A DAY   escitalopram (LEXAPRO) 20 MG tablet TAKE 1 AND 1/2 TABLETS DAILY BY MOUTH   fluticasone (FLONASE) 50 MCG/ACT nasal spray PLACE 1 SPRAY INTO BOTH NOSTRILS 2 (TWO) TIMES DAILY   furosemide (LASIX) 20 MG tablet TAKE 1  TABLET BY MOUTH EVERY DAY   gabapentin (NEURONTIN) 300 MG capsule TAKE 2 CAPSULES IN THE MORNING AND 3 CAPSULES AT NIGHT   JARDIANCE 10 MG TABS tablet TAKE 1 TABLET BY MOUTH DAILY BEFORE BREAKFAST.   LORazepam (ATIVAN) 0.5 MG tablet TAKE 1/2 TO 1 TABLET BY MOUTH TWICE A DAY AS NEEDED FOR ANXIETY   metFORMIN (GLUCOPHAGE) 500 MG tablet Take 500 mg by mouth at bedtime.   metoprolol succinate (TOPROL-XL) 25 MG 24 hr tablet TAKE 1 TABLET (25 MG TOTAL) BY MOUTH DAILY.   Multiple Vitamin (MULTIVITAMIN WITH MINERALS) TABS tablet Take 1 tablet by mouth  daily.   nitroGLYCERIN (NITROSTAT) 0.4 MG SL tablet Place 1 tablet (0.4 mg total) under the tongue every 5 (five) minutes as needed.   nystatin (MYCOSTATIN) 100000 UNIT/ML suspension TAKE 1 TEASPOONFUL BY MOUTH 3 TIMES DAILY AS NEEDED FOR MOUTH SORES.   PROAIR RESPICLICK 123XX123 (90 Base) MCG/ACT AEPB INHALE 2 PUFFS INTO THE LUNGS EVERY 6 HOURS AS NEEDED FOR DYSPNEA/WHEEZE   spironolactone (ALDACTONE) 25 MG tablet TAKE 1/2 TABLET BY MOUTH EVERY DAY   ticagrelor (BRILINTA) 90 MG TABS tablet Take 1 tablet (90 mg total) by mouth 2 (two) times daily.   No facility-administered encounter medications on file as of 10/15/2021.    Allergies (verified) Metolazone   History: Past Medical History:  Diagnosis Date   Acute pancreatitis    Asthma    main dyspnea thought due to deconditioning (10/2013)   Bipolar I disorder, most recent episode (or current) manic, unspecified    pt denies this   Bronchitis, chronic obstructive ([place]) 2008 & 2009   FeV1 64% TLC 105% DLCO 55% 2008  -  FeV1 81% FeF 25-75 43% 2009   CAD (coronary artery disease)    inferior STEMI in 10/2020 s/p DES to RCA   Chronic combined systolic and diastolic CHF (congestive heart failure) (HCC)    LVEF 30-35% in 01/2020 but improved to 60-65% in 05/2021   COPD (chronic obstructive pulmonary disease) (HCC)    emphysema, not an issue per pulm (10/2013)   History of pyelonephritis 05/2012   History of ST elevation myocardial infarction (STEMI)    HTN (hypertension)    Hx of migraines    Hyperlipidemia    Knee pain, right    Lumbar disc disease with radiculopathy    multilevel spondylitic changes, scoliosis, and anterolisthesis L4 not thought to currently be surg candidate (Dr. Luiz Ochoa, [facility]) (Dr. Maryjean Ka, [place])   Nicotine addiction    Obesity    RSV (respiratory syncytial virus pneumonia) 01/2020   hospitalization   Suicide attempt ([place]) 06/2001   Swelling of limb    TIA (transient ischemic attack)    Type 2 diabetes mellitus  ([place])    Urge and stress incontinence 07/2012   ([place])   Past Surgical History:  Procedure Laterality Date   BUBBLE STUDY  04/02/2020   Procedure: BUBBLE STUDY;  Surgeon: Thayer Headings, MD;  Location: [place];  Service: Cardiovascular;;   CHOLECYSTECTOMY  07/1982   CORONARY/GRAFT ACUTE MI REVASCULARIZATION N/A 11/02/2020   Procedure: Coronary/Graft Acute MI Revascularization;  Surgeon: Nelva Bush, MD;  Location: [place] [facility];  Service: Cardiovascular;  Laterality: N/A;   CT MAXILLOFACIAL WO/W CM  06/14/06   Left max mucocele   CT of chest  06/14/2006   Normal except c/w active inflammation / infection.  Left renal atrophy   ERCP / sphincterotomy - stenosis  01/15/06   IR ANGIO INTRA EXTRACRAN SEL  COM CAROTID INNOMINATE BILAT MOD SED  02/09/2020   IR ANGIO VERTEBRAL SEL SUBCLAVIAN INNOMINATE UNI L MOD SED  02/09/2020   IR ANGIO VERTEBRAL SEL VERTEBRAL UNI R MOD SED  02/09/2020   IR ANGIO VERTEBRAL SEL VERTEBRAL UNI R MOD SED  02/13/2020   IR US GUIDE VASC ACCESS RIGHT  02/09/2020   IR US GUIDE VASC ACCESS RIGHT  02/13/2020   LEFT HEART CATH AND CORONARY ANGIOGRAPHY N/A 11/02/2020   Procedure: LEFT HEART CATH AND CORONARY ANGIOGRAPHY;  Surgeon: Nelva Bush, MD;  Location: [place] [facility];  Service: Cardiovascular;  Laterality: N/A;   LOOP RECORDER INSERTION N/A 04/02/2020   Procedure: LOOP RECORDER INSERTION;  Surgeon: Evans Lance, MD;  Location: [place] [facility];  Service: Cardiovascular;  Laterality: N/A;   lumbar MRI  11/2010   multilevel spondylitic changes with upper lumbar scoliosis and anterolisthesis of L4 on 5 with some L foraminal narrowing esp at L/3 with concavity of scoliosis, some central stenosis and biforaminal narrowing at L4/5 with spondylolisthesis   [place] - Pneumonia, acute asthma, left maxillary sinusitis  01/11 - 06/18/2006   RADIOLOGY WITH ANESTHESIA N/A 02/13/2020   Procedure: Carotid Stent;  Surgeon: Luanne Bras, MD;  Location:  [place];  Service: Radiology;  Laterality: N/A;   Retained stone     ERCP secondary to retained stone   Right knee surgery  1984,1989,1989,1991,1994   Reconstructions for ACL insuff   TEE WITHOUT CARDIOVERSION N/A 04/02/2020   Procedure: TRANSESOPHAGEAL ECHOCARDIOGRAM (TEE);  Surgeon: Acie Fredrickson Wonda Cheng, MD;  [facility];  Service: Cardiovascular;  Laterality: N/A;   TUBAL LIGATION     Family History  Problem Relation Age of Onset   Heart disease Brother        MI in early 86's   Hypertension Mother    Hyperlipidemia Mother    Heart disease Mother    Hypertension Father    Asthma Father    Breast cancer Paternal Grandmother    Breast cancer Paternal Aunt    Social History   Socioeconomic History   Marital status: Widowed    Spouse name: Not on file   Number of children: 3   Years of education: Not on file   Highest education level: Not on file  Occupational History   Occupation: Biomedical engineer    Employer: [employer]     Employer: [employer]   Tobacco Use   Smoking status: Former    Packs/day: 0.25    Years: 41.00    Pack years: 10.25    Types: Cigarettes    Quit date: 10/01/2021    Years since quitting: 0.0   Smokeless tobacco: Never  Vaping Use   Vaping Use: Never used  Substance and Sexual Activity   Alcohol use: Yes    Alcohol/week: 0.0 standard drinks    Comment: occasional   Drug use: Not Currently    Types: Marijuana   Sexual activity: Not on file  Other Topics Concern   Not on file  Social History Narrative   Lives with husband and son and daughter in [place] and grandchild   Occupation: Art therapist at [employer]   Activity: limited by dyspnea   Diet: good water, fruits/vegetables daily   Social Determinants of Health   Financial Resource Strain: Low Risk    Difficulty of Paying Living Expenses: Not hard at all  Food Insecurity: No Food Insecurity   Worried About Charity fundraiser in the Last  Year:  Never true   [place] in the Last Year: Never true  Transportation Needs: No Transportation Needs   Lack of Transportation (Medical): No   Lack of Transportation (Non-Medical): No  Physical Activity: Sufficiently Active   Days of Exercise per Week: 5 days   Minutes of Exercise per Session: 30 min  Stress: Stress Concern Present   Feeling of Stress : To some extent  Social Connections: Moderately Isolated   Frequency of Communication with Friends and Family: More than three times a week   Frequency of Social Gatherings with Friends and Family: More than three times a week   Attends Religious Services: Never   Marine scientist or Organizations: No   Attends Music therapist: Never   Marital Status: Married    Tobacco Counseling Counseling given: Not Answered   Clinical Intake:  Pre-visit preparation completed: Yes  Pain : No/denies pain     BMI - recorded: 45.16 Nutritional Status: BMI > 30  Obese Nutritional Risks: None Diabetes: Yes  How often do you need to have someone help you when you read instructions, pamphlets, or other written materials from your doctor or pharmacy?: 1 - Never  Diabetic?Nutrition Risk Assessment:  Has the patient had any N/V/D within the last 2 months?  No  Does the patient have any non-healing wounds?  No  Has the patient had any unintentional weight loss or weight gain?  No   Diabetes:  Is the patient diabetic?  Yes  If diabetic, was a CBG obtained today?  No  Did the patient bring in their glucometer from home?  No  How often do you monitor your CBG's? daily.   Financial Strains and Diabetes Management:  Are you having any financial strains with the device, your supplies or your medication? No .  Does the patient want to be seen by Chronic Care Management for management of their diabetes?  No  Would the patient like to be referred to a Nutritionist or for Diabetic Management?  No   Diabetic  Exams:  Diabetic Eye Exam: Completed due. Overdue for diabetic eye exam. Pt has been advised about the importance in completing this exam.  Diabetic Foot Exam: Completed 11/28/2018. Pt has been advised about the importance in completing this exam.   Interpreter Needed?: No  Information entered by :: mj Josetta Wigal, lpn   Activities of Daily Living    10/15/2021   12:08 PM 11/03/2020    5:00 AM  In your present state of health, do you have any difficulty performing the following activities:  Hearing? 0 0  Vision? 0 0  Difficulty concentrating or making decisions? 0 0  Walking or climbing stairs? 0 1  Dressing or bathing? 0 0  Doing errands, shopping? 0 0  Preparing Food and eating ? N   Using the Toilet? N   In the past six months, have you accidently leaked urine? Y   Comment stress incontinence   Do you have problems with loss of bowel control? N   Managing your Medications? N   Managing your Finances? N   Housekeeping or managing your Housekeeping? N     Patient Care Team: Ria Bush, MD as PCP - General [place], Cassie Freer, MD as PCP - Cardiology (Internal Medicine) Charlton Haws, [facility] as Pharmacist (Pharmacist)  Indicate any recent Medical Services you may have received from other than [facility] providers in the past year (date may be approximate).  Assessment:   This is a routine wellness examination for Jakea.  Hearing/Vision screen Hearing Screening - Comments:: No hearing issues.  Vision Screening - Comments:: Glasses. 2021. No Optometrist at this time.   Dietary issues and exercise activities discussed: Current Exercise Habits: Home exercise routine, Type of exercise: Other - see comments (chair exercises.), Time (Minutes): 30, Frequency (Times/Week): 5, Weekly Exercise (Minutes/Week): 150, Intensity: Mild, Exercise limited by: cardiac condition(s);respiratory conditions(s);orthopedic condition(s)   Goals Addressed             This Visit's Progress     Weight (lb) < 200 lb (90.7 kg)   239 lb (108.4 kg)    Try to lose weight, stay active.        Depression Screen    10/15/2021   12:03 PM 10/08/2021   11:55 AM 10/01/2020   12:15 PM 02/23/2020   12:24 PM 08/07/2019    4:32 PM 11/28/2018    9:01 AM 07/27/2018   11:27 AM  PHQ 2/9 Scores  PHQ - 2 Score 1 1 0 6 4 3 2   PHQ- 9 Score 1 5 1 20 17 11 11     Fall Risk    10/15/2021   12:07 PM 10/08/2021   11:18 AM 10/01/2020   11:07 AM  Fall Risk   Falls in the past year? 0 0 0  Number falls in past yr: 0    Injury with Fall? 0    Risk for fall due to : No Fall Risks    Follow up Falls prevention discussed      Falling Water:  Any stairs in or around the home? Yes  If so, are there any without handrails? No  Home free of loose throw rugs in walkways, pet beds, electrical cords, etc? Yes  Adequate lighting in your home to reduce risk of falls? Yes   ASSISTIVE DEVICES UTILIZED TO PREVENT FALLS:  Life alert? No  Use of a cane, walker or w/c? Yes  Grab bars in the bathroom? Yes  Shower chair or bench in shower? Yes  Elevated toilet seat or a handicapped toilet? No   TIMED UP AND GO:  Was the test performed? No . Phone visit.  Cognitive Function:        10/15/2021   12:08 PM  6CIT Screen  What Year? 0 points  What month? 0 points  What time? 0 points  Count back from 20 0 points  Months in reverse 0 points  Repeat phrase 0 points  Total Score 0 points    Immunizations Immunization History  Administered Date(s) Administered   Influenza Whole 04/01/2002   Influenza,inj,Quad PF,6+ Mos 03/21/2015, 07/27/2018, 02/08/2019, 02/23/2020, 04/04/2021   Influenza-Unspecified 03/01/2014, 04/22/2016, 03/25/2017   PFIZER(Purple Top)SARS-COV-2 Vaccination 02/09/2020, 03/06/2020   PNEUMOCOCCAL CONJUGATE-20 10/01/2020   Pneumococcal Polysaccharide-23 04/01/2002, 07/24/2015   Td 06/01/1993   Tdap 02/24/2017    TDAP status: Up to date  Flu Vaccine  status: Up to date  Pneumococcal vaccine status: Up to date  Covid-19 vaccine status: Completed vaccines  Qualifies for Shingles Vaccine? Yes   Zostavax completed No   Shingrix Completed?: No.    Education has been provided regarding the importance of this vaccine. Patient has been advised to call insurance company to determine out of pocket expense if they have not yet received this vaccine. Advised may also receive vaccine at local pharmacy or Health Dept. Verbalized acceptance and understanding.  Screening Tests Health Maintenance  Topic Date Due  COLON CANCER SCREENING ANNUAL FOBT  08/15/2020   COVID-19 Vaccine (3 - Pfizer risk series) 10/31/2021 (Originally 04/03/2020)   FOOT EXAM  01/29/2022 (Originally 11/28/2019)   MAMMOGRAM  02/27/2022 (Originally 10/05/2021)   OPHTHALMOLOGY EXAM  02/27/2022 (Originally [birth year]/1968)   Zoster Vaccines- Shingrix (1 of 2) 02/27/2022 (Originally 07/13/1977)   COLONOSCOPY (Pts 45-12yrs Insurance coverage will need to be confirmed)  10/16/2022 (Originally 07/14/2003)   INFLUENZA VACCINE  12/30/2021   HEMOGLOBIN A1C  04/05/2022   PAP SMEAR-Modifier  08/07/2022   TETANUS/TDAP  02/25/2027   Hepatitis C Screening  Completed   HIV Screening  Completed   HPV VACCINES  Aged Out    Health Maintenance  Health Maintenance Due  Topic Date Due   COLON CANCER SCREENING ANNUAL FOBT  08/15/2020    Colorectal cancer screening: Type of screening: FOBT/FIT. Completed 08/16/2019. Repeat every 1 years  Mammogram status: Completed 10/05/2020. Repeat every year  Bone Density status: Ordered due at age 39.Marland Kitchen Pt provided with contact info and advised to call to schedule appt.  Lung Cancer Screening: (Low Dose CT Chest recommended if Age 17-80 years, 30 pack-year currently smoking OR have quit w/in 15years.) does qualify.   Lung Cancer Screening Referral: declined at this time.  Additional Screening:  Hepatitis C Screening: does qualify; Completed 01/31/2020  Vision  Screening: Recommended annual ophthalmology exams for early detection of glaucoma and other disorders of the eye. Is the patient up to date with their annual eye exam?  No  Who is the provider or what is the name of the office in which the patient attends annual eye exams? N/A If pt is not established with a provider, would they like to be referred to a provider to establish care? No .   Dental Screening: Recommended annual dental exams for proper oral hygiene  Community Resource Referral / Chronic Care Management: CRR required this visit?  No   CCM required this visit?  No      Plan:     I have personally reviewed and noted the following in the patient's chart:   Medical and social history Use of alcohol, tobacco or illicit drugs  Current medications and supplements including opioid prescriptions. Patient is not currently taking opioid prescriptions. Functional ability and status Nutritional status Physical activity Advanced directives List of other physicians Hospitalizations, surgeries, and ER visits in previous 12 months Vitals Screenings to include cognitive, depression, and falls Referrals and appointments  In addition, I have reviewed and discussed with patient certain preventive protocols, quality metrics, and best practice recommendations. A written personalized care plan for preventive services as well as general preventive health recommendations were provided to patient.     [name], LPN   [id number]   Nurse Notes: Pt states she will call to set up Mammogram. Has information/supplies to complete FOBT. Declined Optometry referral at this time. Pt states she is going to call and schedule with [facility].

## 2021-10-27 ENCOUNTER — Other Ambulatory Visit: Payer: Self-pay | Admitting: Cardiovascular Disease

## 2021-10-28 ENCOUNTER — Ambulatory Visit (INDEPENDENT_AMBULATORY_CARE_PROVIDER_SITE_OTHER): Payer: Medicare Other

## 2021-10-28 DIAGNOSIS — Z8673 Personal history of transient ischemic attack (TIA), and cerebral infarction without residual deficits: Secondary | ICD-10-CM

## 2021-10-28 LAB — CUP PACEART REMOTE DEVICE CHECK
Date Time Interrogation Session: 20230529231032
Implantable Pulse Generator Implant Date: 20211102

## 2021-10-30 ENCOUNTER — Other Ambulatory Visit: Payer: Self-pay | Admitting: Family Medicine

## 2021-10-31 NOTE — Telephone Encounter (Addendum)
Name of Medication: Lorazepam Name of Pharmacy: [facility] Last Fill or Written Date and Quantity: 10/07/21, #30 Last Office Visit and Type: 10/08/21, AWV Next Office Visit and Type: 04/10/22, 6 mo DM f/u Last Controlled Substance Agreement Date: none Last UDS: none  Albuterol neb last filled:  09/17/21, #150 mL

## 2021-11-05 DIAGNOSIS — G4733 Obstructive sleep apnea (adult) (pediatric): Secondary | ICD-10-CM | POA: Diagnosis not present

## 2021-11-07 NOTE — Progress Notes (Signed)
Carelink Summary Report / Loop Recorder 

## 2021-11-11 NOTE — Progress Notes (Signed)
Cardiology Office Note:   Date:  11/13/2021  NAME:  Brenda [name]    MRN: [id number] DOB:  07/27/1960   PCP:  [name], MD  Cardiologist:  Reatha Harps, MD  Electrophysiologist:  None   Referring MD: [name], MD   Chief Complaint  Patient presents with   Follow-up    History of Present Illness:   Brenda [name] is a 61 y.o. female with a hx of CAD, COPD, systolic heart failure, stroke who presents for follow-up.  She reports she is doing well.  Denies any chest pain.  Still getting short of breath with activity.  Likely related to COPD.  She quit smoking 1 month ago.  I congratulated her on this.  A1c is creeped up to 6.5.  LDL at goal.  She has completed 1 year of aspirin and Brilinta.  Can stop Brilinta.  Blood pressure is well controlled.  No symptoms of heart failure.  Overall doing well.  Problem List 1. NSTEMI 01/30/2020 -stress induced CM 2. Systolic HF -EF 30-35% with apical akinesis 01/31/2020 in setting of COPD/hypercarbic respiratory failure -> Takotsubo CM -EF 40-45% with improvement in WMA 02/08/2020 -EF 55-60% 06/28/2020 -EF 45-50% 11/02/2020 (Inferior STEMI) 3. STEMI 11/02/2020 -PCI to dRCA 4. COPD -severe COPD -2L 5. Tobacco abuse 6. HTN 7. DM -A1c 6.5 8. HLD -T chol 100, HDL 42, LDL 36, triglycerides 112 9. CVA 01/2020/Cryptogenic -basilar artery stenosis? -TEE negative -loop recorder placed 10. SVT  Past Medical History: Past Medical History:  Diagnosis Date   Acute pancreatitis    Asthma    main dyspnea thought due to deconditioning (10/2013)   Bipolar I disorder, most recent episode (or current) manic, unspecified    pt denies this   Bronchitis, chronic obstructive (HCC) 2008 & 2009   FeV1 64% TLC 105% DLCO 55% 2008  -  FeV1 81% FeF 25-75 43% 2009   CAD (coronary artery disease)    inferior STEMI in 10/2020 s/p DES to RCA   Chronic combined systolic and diastolic CHF (congestive heart failure) (HCC)    LVEF 30-35% in 01/2020 but  improved to 60-65% in 05/2021   COPD (chronic obstructive pulmonary disease) (HCC)    emphysema, not an issue per pulm (10/2013)   History of pyelonephritis 05/2012   History of ST elevation myocardial infarction (STEMI)    HTN (hypertension)    Hx of migraines    Hyperlipidemia    Knee pain, right    Lumbar disc disease with radiculopathy    multilevel spondylitic changes, scoliosis, and anterolisthesis L4 not thought to currently be surg candidate (Dr. Phoebe Perch, [facility]) (Dr. Ollen Bowl, [place])   Nicotine addiction    Obesity    RSV (respiratory syncytial virus pneumonia) 01/2020   hospitalization   Suicide attempt (HCC) 06/2001   Swelling of limb    TIA (transient ischemic attack)    Type 2 diabetes mellitus (HCC)    Urge and stress incontinence 07/2012   ([facility])    Past Surgical History: Past Surgical History:  Procedure Laterality Date   BUBBLE STUDY  04/02/2020   Procedure: BUBBLE STUDY;  Surgeon: Vesta Mixer, MD;  Location: [facility] ENDOSCOPY;  Service: Cardiovascular;;   CHOLECYSTECTOMY  07/1982   CORONARY/GRAFT ACUTE MI REVASCULARIZATION N/A 11/02/2020   Procedure: Coronary/Graft Acute MI Revascularization;  Surgeon: Yvonne Kendall, MD;  Location: [facility] INVASIVE CV LAB;  Service: Cardiovascular;  Laterality: N/A;   CT MAXILLOFACIAL WO/W CM  06/14/06   Left max mucocele  CT of chest  06/14/2006   Normal except c/w active inflammation / infection.  Left renal atrophy   ERCP / sphincterotomy - stenosis  01/15/06   IR ANGIO INTRA EXTRACRAN SEL COM CAROTID INNOMINATE BILAT MOD SED  02/09/2020   IR ANGIO VERTEBRAL SEL SUBCLAVIAN INNOMINATE UNI L MOD SED  02/09/2020   IR ANGIO VERTEBRAL SEL VERTEBRAL UNI R MOD SED  02/09/2020   IR ANGIO VERTEBRAL SEL VERTEBRAL UNI R MOD SED  02/13/2020   IR US GUIDE VASC ACCESS RIGHT  02/09/2020   IR US GUIDE VASC ACCESS RIGHT  02/13/2020   LEFT HEART CATH AND CORONARY ANGIOGRAPHY N/A 11/02/2020   Procedure: LEFT HEART CATH AND CORONARY ANGIOGRAPHY;   Surgeon: Yvonne Kendall, MD;  Location: [facility] INVASIVE CV LAB;  Service: Cardiovascular;  Laterality: N/A;   LOOP RECORDER INSERTION N/A 04/02/2020   Procedure: LOOP RECORDER INSERTION;  Surgeon: Marinus Maw, MD;  Location: [facility] INVASIVE CV LAB;  Service: Cardiovascular;  Laterality: N/A;   lumbar MRI  11/2010   multilevel spondylitic changes with upper lumbar scoliosis and anterolisthesis of L4 on 5 with some L foraminal narrowing esp at L/3 with concavity of scoliosis, some central stenosis and biforaminal narrowing at L4/5 with spondylolisthesis   [place] - Pneumonia, acute asthma, left maxillary sinusitis  01/11 - 06/18/2006   RADIOLOGY WITH ANESTHESIA N/A 02/13/2020   Procedure: Carotid Stent;  Surgeon: Julieanne Cotton, MD;  Location: [facility] OR;  Service: Radiology;  Laterality: N/A;   Retained stone     ERCP secondary to retained stone   Right knee surgery  1984,1989,1989,1991,1994   Reconstructions for ACL insuff   TEE WITHOUT CARDIOVERSION N/A 04/02/2020   Procedure: TRANSESOPHAGEAL ECHOCARDIOGRAM (TEE);  Surgeon: Vesta Mixer, MD;  Location: [facility] ENDOSCOPY;  Service: Cardiovascular;  Laterality: N/A;   TUBAL LIGATION      Current Medications: Current Meds  Medication Sig   albuterol (PROVENTIL) (2.5 MG/3ML) 0.083% nebulizer solution INHALE 1 VIAL VIA NEBULIZER EVERY 6 HOURS AS NEEDED FOR WHEEZING/SHORTNESS OF BREATH   ARIPiprazole (ABILIFY) 10 MG tablet Take 1 tablet (10 mg total) by mouth daily.   aspirin EC 81 MG EC tablet Take 1 tablet (81 mg total) by mouth daily. Swallow whole.   atorvastatin (LIPITOR) 80 MG tablet TAKE 1 TABLET BY MOUTH EVERYDAY AT BEDTIME   Budeson-Glycopyrrol-Formoterol (BREZTRI AEROSPHERE) 160-9-4.8 MCG/ACT AERO INHALE 2 PUFFS INTO LUNGS 2 TIMES DAILY   Cholecalciferol (VITAMIN D3) 25 MCG (1000 UT) CAPS Take 1 capsule (1,000 Units total) by mouth daily.   ENTRESTO 24-26 MG TAKE 1 TABLET BY MOUTH TWICE A DAY   escitalopram (LEXAPRO) 20 MG tablet TAKE 1  AND 1/2 TABLETS DAILY BY MOUTH   fluticasone (FLONASE) 50 MCG/ACT nasal spray PLACE 1 SPRAY INTO BOTH NOSTRILS 2 (TWO) TIMES DAILY   furosemide (LASIX) 20 MG tablet TAKE 1 TABLET BY MOUTH EVERY DAY   gabapentin (NEURONTIN) 300 MG capsule TAKE 2 CAPSULES IN THE MORNING AND 3 CAPSULES AT NIGHT   JARDIANCE 10 MG TABS tablet TAKE 1 TABLET BY MOUTH DAILY BEFORE BREAKFAST.   LORazepam (ATIVAN) 0.5 MG tablet TAKE 1/2 TO 1 TABLET BY MOUTH TWICE A DAY AS NEEDED FOR ANXIETY   metFORMIN (GLUCOPHAGE) 500 MG tablet Take 500 mg by mouth at bedtime.   metoprolol succinate (TOPROL-XL) 25 MG 24 hr tablet TAKE 1 TABLET (25 MG TOTAL) BY MOUTH DAILY.   Multiple Vitamin (MULTIVITAMIN WITH MINERALS) TABS tablet Take 1 tablet by mouth daily.   PROAIR  RESPICLICK 108 (90 Base) MCG/ACT AEPB INHALE 2 PUFFS INTO THE LUNGS EVERY 6 HOURS AS NEEDED FOR DYSPNEA/WHEEZE   spironolactone (ALDACTONE) 25 MG tablet TAKE 1/2 TABLET BY MOUTH EVERY DAY   [DISCONTINUED] ticagrelor (BRILINTA) 90 MG TABS tablet Take 1 tablet (90 mg total) by mouth 2 (two) times daily.     Allergies:    Metolazone   Social History: Social History   Socioeconomic History   Marital status: Widowed    Spouse name: Not on file   Number of children: 3   Years of education: Not on file   Highest education level: Not on file  Occupational History   Occupation: Geographical information systems officerursing Secretary    Employer: [employer]     Employer: [employer]   Tobacco Use   Smoking status: Former    Packs/day: 0.25    Years: 41.00    Total pack years: 10.25    Types: Cigarettes    Quit date: 10/01/2021    Years since quitting: 0.1   Smokeless tobacco: Never  Vaping Use   Vaping Use: Never used  Substance and Sexual Activity   Alcohol use: Yes    Alcohol/week: 0.0 standard drinks of alcohol    Comment: occasional   Drug use: Not Currently    Types: Marijuana   Sexual activity: Not on file  Other Topics Concern   Not on file  Social History Narrative    Lives with husband and son and daughter in law and grandchild   Occupation: Media plannermedical secretary at [employer]   Activity: limited by dyspnea   Diet: good water, fruits/vegetables daily   Social Determinants of Health   Financial Resource Strain: Low Risk  (10/15/2021)   Overall Financial Resource Strain (CARDIA)    Difficulty of Paying Living Expenses: Not hard at all  Food Insecurity: No Food Insecurity (10/15/2021)   Hunger Vital Sign    Worried About Running Out of Food in the Last Year: Never true    Ran Out of Food in the Last Year: Never true  Transportation Needs: No Transportation Needs (10/15/2021)   PRAPARE - Administrator, Civil ServiceTransportation    Lack of Transportation (Medical): No    Lack of Transportation (Non-Medical): No  Physical Activity: Sufficiently Active (10/15/2021)   Exercise Vital Sign    Days of Exercise per Week: 5 days    Minutes of Exercise per Session: 30 min  Stress: Stress Concern Present (10/15/2021)   [employer] - Occupational Stress Questionnaire    Feeling of Stress : To some extent  Social Connections: Moderately Isolated (10/15/2021)   Social Connection and Isolation Panel [NHANES]    Frequency of Communication with Friends and Family: More than three times a week    Frequency of Social Gatherings with Friends and Family: More than three times a week    Attends Religious Services: Never    Database administratorActive Member of Clubs or Organizations: No    Attends Engineer, structuralClub or Organization Meetings: Never    Marital Status: Married     Family History: The patient's family history includes Asthma in her father; Breast cancer in her paternal aunt and paternal grandmother; Heart disease in her brother and mother; Hyperlipidemia in her mother; Hypertension in her father and mother.  ROS:   All other ROS reviewed and negative. Pertinent positives noted in the HPI.     EKGs/Labs/Other Studies Reviewed:   The following studies were personally reviewed  by me today:   TTE 05/09/2021  1.  Left ventricular ejection fraction, by estimation, is 60 to 65%. The  left ventricle has normal function. The left ventricle has no regional  wall motion abnormalities. Left ventricular diastolic parameters were  normal.   2. Right ventricular systolic function is normal. The right ventricular  size is normal.   3. The mitral valve is normal in structure. Trivial mitral valve  regurgitation.   4. The aortic valve is normal in structure. Aortic valve regurgitation is  not visualized.   5. The inferior vena cava is normal in size with greater than 50%  respiratory variability, suggesting right atrial pressure of 3 mmHg.   Recent Labs: 10/03/2021: ALT 20; BUN 14; Creatinine, Ser 0.99; Hemoglobin 13.9; Platelets 309.0; Potassium 4.0; Sodium 142   Recent Lipid Panel    Component Value Date/Time   CHOL 100 10/03/2021 0855   TRIG 112.0 10/03/2021 0855   HDL 42.30 10/03/2021 0855   CHOLHDL 2 10/03/2021 0855   VLDL 22.4 10/03/2021 0855   LDLCALC 36 10/03/2021 0855    Physical Exam:   VS:  BP 108/64 (BP Location: Left Arm, Patient Position: Sitting, Cuff Size: Large)   Pulse 75   Ht 5' (1.524 m)   Wt 243 lb 9.6 oz (110.5 kg)   LMP 03/08/2013   SpO2 93%   BMI 47.57 kg/m    Wt Readings from Last 3 Encounters:  11/13/21 243 lb 9.6 oz (110.5 kg)  10/15/21 239 lb (108.4 kg)  10/08/21 239 lb 8 oz (108.6 kg)    General: Well nourished, well developed, in no acute distress Head: Atraumatic, normal size  Eyes: PEERLA, EOMI  Neck: Supple, no JVD Endocrine: No thryomegaly Cardiac: Normal S1, S2; RRR; no murmurs, rubs, or gallops Lungs: Diffuse rhonchi Abd: Soft, nontender, no hepatomegaly  Ext: No edema, pulses 2+ Musculoskeletal: No deformities, BUE and BLE strength normal and equal Skin: Warm and dry, no rashes   Neuro: Alert and oriented to person, place, time, and situation, CNII-XII grossly intact, no focal deficits  Psych: Normal mood and  affect   ASSESSMENT:   Brenda [name] is a 61 y.o. female who presents for the following: 1. Coronary artery disease involving native coronary artery of native heart without angina pectoris   2. Mixed hyperlipidemia   3. Chronic systolic heart failure (HCC)   4. Primary hypertension   5. Tobacco abuse     PLAN:   1. Coronary artery disease involving native coronary artery of native heart without angina pectoris 2. Mixed hyperlipidemia -Inferior STEMI 11/02/2020.  Has completed 1 year of DAPT.  Stop Brilinta.  Continue aspirin 81 mg daily.  On Lipitor 80 mg daily.  LDL 36.  At goal.  No symptoms of angina.  Echo shows normal LV function.  3. Chronic systolic heart failure (HCC) -History of Takotsubo cardiomyopathy in the past.  EF has recovered.  Continue current regimen which includes metoprolol succinate 25 mg daily Entresto 24-26 mg twice daily, Aldactone 25 mg daily.  She takes Lasix 20 mg daily.  Euvolemic on exam.  4. Primary hypertension -Well-controlled.  5. Tobacco abuse -Quit smoking 1 month ago.  I congratulated her on this.     Disposition: Return in about 1 year (around 11/14/2022).  Medication Adjustments/Labs and Tests Ordered: Current medicines are reviewed at length with the patient today.  Concerns regarding medicines are outlined above.  No orders of the defined types were placed in this encounter.  No orders of the defined types were placed in this encounter.  Patient Instructions  Medication Instructions:  STOP Brilinta   *If you need a refill on your cardiac medications before your next appointment, please call your pharmacy*   Follow-Up: At [facility], you and your health needs are our priority.  As part of our continuing mission to provide you with exceptional heart care, we have created designated Provider Care Teams.  These Care Teams include your primary Cardiologist (physician) and Advanced Practice Providers (APPs -  Physician Assistants and  Nurse Practitioners) who all work together to provide you with the care you need, when you need it.  We recommend signing up for the patient portal called "MyChart".  Sign up information is provided on this After Visit Summary.  MyChart is used to connect with patients for Virtual Visits (Telemedicine).  Patients are able to view lab/test results, encounter notes, upcoming appointments, etc.  Non-urgent messages can be sent to your provider as well.   To learn more about what you can do with MyChart, go to [email or web address].    Your next appointment:   12 month(s)  The format for your next appointment:   In Person  Provider:   Reatha Harps, MD              Time Spent with Patient: I have spent a total of 25 minutes with patient reviewing hospital notes, telemetry, EKGs, labs and examining the patient as well as establishing an assessment and plan that was discussed with the patient.  > 50% of time was spent in direct patient care.  Signed, Lenna Gilford. Flora Lipps, MD, [facility]  [facility]  [address], [address] [place], [place] [address] [phone]  11/13/2021 10:48 AM

## 2021-11-13 ENCOUNTER — Ambulatory Visit (INDEPENDENT_AMBULATORY_CARE_PROVIDER_SITE_OTHER): Payer: Medicare Other | Admitting: Cardiovascular Disease

## 2021-11-13 ENCOUNTER — Encounter: Payer: Self-pay | Admitting: Cardiovascular Disease

## 2021-11-13 VITALS — BP 108/64 | HR 75 | Ht 60.0 in | Wt 243.6 lb

## 2021-11-13 DIAGNOSIS — E782 Mixed hyperlipidemia: Secondary | ICD-10-CM

## 2021-11-13 DIAGNOSIS — I5022 Chronic systolic (congestive) heart failure: Secondary | ICD-10-CM

## 2021-11-13 DIAGNOSIS — I2510 Atherosclerotic heart disease of native coronary artery without angina pectoris: Secondary | ICD-10-CM

## 2021-11-13 DIAGNOSIS — Z720 Tobacco use: Secondary | ICD-10-CM | POA: Diagnosis not present

## 2021-11-13 DIAGNOSIS — I10 Essential (primary) hypertension: Secondary | ICD-10-CM | POA: Diagnosis not present

## 2021-11-13 NOTE — Patient Instructions (Signed)
Medication Instructions:  STOP Brilinta   *If you need a refill on your cardiac medications before your next appointment, please call your pharmacy*   Follow-Up: At [facility], you and your health needs are our priority.  As part of our continuing mission to provide you with exceptional heart care, we have created designated Provider Care Teams.  These Care Teams include your primary Cardiologist (physician) and Advanced Practice Providers (APPs -  Physician Assistants and Nurse Practitioners) who all work together to provide you with the care you need, when you need it.  We recommend signing up for the patient portal called "MyChart".  Sign up information is provided on this After Visit Summary.  MyChart is used to connect with patients for Virtual Visits (Telemedicine).  Patients are able to view lab/test results, encounter notes, upcoming appointments, etc.  Non-urgent messages can be sent to your provider as well.   To learn more about what you can do with MyChart, go to [email or web address].    Your next appointment:   12 month(s)  The format for your next appointment:   In Person  Provider:   Reatha Harps, MD

## 2021-11-22 ENCOUNTER — Other Ambulatory Visit: Payer: Self-pay | Admitting: Family Medicine

## 2021-11-24 NOTE — Telephone Encounter (Signed)
Name of Medication: Lorazepam Name of Pharmacy: [facility] [address] Last Fill or Written Date and Quantity: 11/03/21, #30 Last Office Visit and Type: 10/08/21, AWV Next Office Visit and Type: 04/10/22, CPE Last Controlled Substance Agreement Date: none Last UDS: none  Gabapentin last filled:  10/04/21, #450

## 2021-11-26 NOTE — Telephone Encounter (Signed)
ERx 

## 2021-12-01 ENCOUNTER — Ambulatory Visit (INDEPENDENT_AMBULATORY_CARE_PROVIDER_SITE_OTHER): Payer: Medicare Other

## 2021-12-01 DIAGNOSIS — G459 Transient cerebral ischemic attack, unspecified: Secondary | ICD-10-CM | POA: Diagnosis not present

## 2021-12-05 DIAGNOSIS — G4733 Obstructive sleep apnea (adult) (pediatric): Secondary | ICD-10-CM | POA: Diagnosis not present

## 2021-12-05 LAB — CUP PACEART REMOTE DEVICE CHECK
Date Time Interrogation Session: 20230701230731
Implantable Pulse Generator Implant Date: 20211102

## 2021-12-09 DIAGNOSIS — G4733 Obstructive sleep apnea (adult) (pediatric): Secondary | ICD-10-CM | POA: Diagnosis not present

## 2021-12-17 ENCOUNTER — Other Ambulatory Visit: Payer: Self-pay | Admitting: Family Medicine

## 2021-12-18 NOTE — Telephone Encounter (Signed)
Name of Medication: Lorazepam Name of Pharmacy: [facility] [address] Last Fill or Written Date and Quantity: 11/26/21, #30 Last Office Visit and Type: 10/08/21, AWV Next Office Visit and Type: 04/10/22, CPE Last Controlled Substance Agreement Date: none Last UDS: none  Abilify last filled:  11/28/21, #90 ProAir last filled:  11/22/21, #1 ea

## 2021-12-19 NOTE — Telephone Encounter (Signed)
ERx 

## 2021-12-22 NOTE — Progress Notes (Signed)
Carelink Summary Report / Loop Recorder 

## 2022-01-02 ENCOUNTER — Ambulatory Visit (INDEPENDENT_AMBULATORY_CARE_PROVIDER_SITE_OTHER): Payer: Medicare Other

## 2022-01-02 DIAGNOSIS — G459 Transient cerebral ischemic attack, unspecified: Secondary | ICD-10-CM

## 2022-01-02 LAB — CUP PACEART REMOTE DEVICE CHECK
Date Time Interrogation Session: 20230803230911
Implantable Pulse Generator Implant Date: 20211102

## 2022-01-05 DIAGNOSIS — G4733 Obstructive sleep apnea (adult) (pediatric): Secondary | ICD-10-CM | POA: Diagnosis not present

## 2022-01-22 NOTE — Progress Notes (Signed)
Carelink Summary Report / Loop Recorder 

## 2022-01-27 ENCOUNTER — Other Ambulatory Visit: Payer: Self-pay | Admitting: Family Medicine

## 2022-01-28 NOTE — Telephone Encounter (Signed)
ERx 

## 2022-01-28 NOTE — Telephone Encounter (Signed)
Refill request Lorazepam last refill 12/19/21 #30 Gabapentin last refill 11/26/21 #450 Last office visit 10/08/21 Upcoming appointment 04/10/22

## 2022-01-30 ENCOUNTER — Ambulatory Visit (INDEPENDENT_AMBULATORY_CARE_PROVIDER_SITE_OTHER): Payer: Medicare Other | Admitting: Family Medicine

## 2022-01-30 ENCOUNTER — Encounter: Payer: Self-pay | Admitting: Family Medicine

## 2022-01-30 DIAGNOSIS — M5432 Sciatica, left side: Secondary | ICD-10-CM

## 2022-01-30 MED ORDER — PREDNISONE 10 MG PO TABS: ORAL_TABLET | ORAL | 0 refills | Status: DC

## 2022-01-30 MED ORDER — HYDROCODONE-ACETAMINOPHEN 5-325 MG PO TABS: 0.5000 | ORAL_TABLET | Freq: Four times a day (QID) | ORAL | 0 refills | Status: DC | PRN

## 2022-01-30 NOTE — Patient Instructions (Signed)
Prednisone with food.  Okay to stop/taper sooner if pain is better or sugar is elevated.   Sedation caution on hydrocodone.  Update [place] as needed.  Take care.  Glad to see you.

## 2022-01-30 NOTE — Progress Notes (Signed)
L hip pain.  Known h/o back pain.  Radiates down the L thigh, down to the L knee.  Going on a few weeks.  Constant.  No falls, no trauma.  No R sided sx.  No FCNAVD.  She has sensation in B feet.  Normal plantar and dorsiflexion.  No burning with urination.  Sugar ~110.  Last A1c 6.5.  already taking gabapentin, up to 1800mg  in a day w/o effect recently.  Tylenol and ibuprofen didn't help.  Steroid cautions.    Prev MRI with   IMPRESSION: 1. Multifactorial degenerative changes at L1-2 with resultant mild canal and severe left lateral recess narrowing, with mild to moderate left L1 foraminal stenosis, mildly progressed from previous. 2. Advanced degenerative spondylolysis at L2-3 with resultant moderate canal and severe left lateral recess narrowing, with severe left L2 foraminal stenosis, similar to previous. 3. Multifactorial degenerative changes at L3-4 with resultant moderate canal with severe bilateral lateral recess narrowing, with moderate to severe bilateral L3 foraminal narrowing, progressed from previous. 4. Similar 8 mm anterolisthesis of L4 on L5 with resultant severe spinal stenosis with moderate right L4 foraminal narrowing. 5. Progressive facet degeneration at L5-S1 with moderate right L5 foraminal stenosis.  Meds, vitals, and allergies reviewed.   ROS: Per HPI unless specifically indicated in ROS section   Nad Ncat Neck supple, no LA Rrr ctab L SLR pos, R SLR neg On O2.  Lower back ttp.

## 2022-02-02 DIAGNOSIS — M5432 Sciatica, left side: Secondary | ICD-10-CM | POA: Insufficient documentation

## 2022-02-02 NOTE — Assessment & Plan Note (Signed)
Discussed options. Already on gabapentin.  Discussed prednisone/steroid cautions.  Can take prednisone with food.  Okay to stop/taper sooner if pain is better or sugar is elevated.   Sedation caution on hydrocodone.  Can use as needed in the meantime.  Cautions discussed with patient. Update [place] as needed.  She agrees with plan.

## 2022-02-04 ENCOUNTER — Ambulatory Visit (INDEPENDENT_AMBULATORY_CARE_PROVIDER_SITE_OTHER): Payer: Medicare Other

## 2022-02-04 DIAGNOSIS — G459 Transient cerebral ischemic attack, unspecified: Secondary | ICD-10-CM | POA: Diagnosis not present

## 2022-02-04 LAB — CUP PACEART REMOTE DEVICE CHECK
Date Time Interrogation Session: 20230905230722
Implantable Pulse Generator Implant Date: 20211102

## 2022-02-05 ENCOUNTER — Other Ambulatory Visit: Payer: Self-pay | Admitting: Family Medicine

## 2022-02-05 DIAGNOSIS — G4733 Obstructive sleep apnea (adult) (pediatric): Secondary | ICD-10-CM | POA: Diagnosis not present

## 2022-02-06 ENCOUNTER — Other Ambulatory Visit: Payer: Self-pay | Admitting: Family Medicine

## 2022-02-06 ENCOUNTER — Telehealth: Payer: Self-pay

## 2022-02-06 NOTE — Patient Outreach (Signed)
  Care Coordination   Initial Visit Note   02/06/2022 Name: Brenda [name] MRN: [id number] DOB: Jun 27, 1960  Brenda [name] is a 61 y.o. year old female who sees [name], MD for primary care. I spoke with  [name] by phone today.  What matters to the patients health and wellness today?   Patient states that she is dealing with back nerve pian for past month. She is taking pain meds-has called PCP office to get refill on med as she is out. She does not want to have surgery. Patient states she has good support system. Patient has [facility] Medicare & Medicaid-aware of benefits and services available to her through insurance including CM services. Denies any RN CM needs or concerns at this time.   Goals Addressed               This Visit's Progress     COMPLETED: relief from back pain (pt-stated)        Care Coordination Interventions: Evaluation of current treatment plan related to back pain mgmt and patient's adherence to plan as established by provider Provided education to patient re: sx/pain mgmt-pharmacological and non-pharmacological measures Assessed social determinant of health barriers         SDOH assessments and interventions completed:  Yes  SDOH Interventions Today    Flowsheet Row Most Recent Value  SDOH Interventions   Food Insecurity Interventions Intervention Not Indicated  Transportation Interventions Intervention Not Indicated        Care Coordination Interventions Activated:  Yes  Care Coordination Interventions:  Yes, provided   Follow up plan: No further intervention required.   Encounter Outcome:  Pt. Visit Completed   Antionette Fairy, RN,BSN,CCM [facility] Care Management Telephonic Care Management Coordinator Direct Phone: [phone] Toll Free: [phone] Fax: [phone]

## 2022-02-06 NOTE — Telephone Encounter (Signed)
Last office visit acute Dr. Para March 01/30/22 Last refill 01/30/22 #20 Upcoming appointment 04/10/22

## 2022-02-06 NOTE — Telephone Encounter (Signed)
  Encourage patient to contact the pharmacy for refills or they can request refills through [facility]  Did the patient contact the pharmacy:  no   LAST APPOINTMENT DATE:  Please schedule appointment if longer than 1 year  NEXT APPOINTMENT DATE:04/03/2022  MEDICATION:HYDROcodone-acetaminophen (NORCO/VICODIN) 5-325 MG tablet  Is the patient out of medication? yes  If not, how much is left?  Is this a 90 day supply: no  PHARMACY: [id number] [place], [place] - [address] Phone:  [phone]  Fax:  [phone]      Let patient know to contact pharmacy at the end of the day to make sure medication is ready.  Please notify patient to allow 48-72 hours to process  CLINICAL FILLS OUT ALL BELOW:

## 2022-02-06 NOTE — Patient Outreach (Signed)
  Care Coordination   02/06/2022 Name: Brenda [name] MRN: [id number] DOB: 1960/06/08   Care Coordination Outreach Attempts:  An unsuccessful telephone outreach was attempted today to offer the patient information about available care coordination services as a benefit of their health plan.   Follow Up Plan:  Additional outreach attempts will be made to offer the patient care coordination information and services.   Encounter Outcome:  No Answer  Care Coordination Interventions Activated:  No   Care Coordination Interventions:  No, not indicated      Antionette Fairy, RN,BSN,CCM [facility] Care Management Telephonic Care Management Coordinator Direct Phone: [phone] Toll Free: [phone] Fax: [phone]

## 2022-02-09 MED ORDER — HYDROCODONE-ACETAMINOPHEN 5-325 MG PO TABS: 0.5000 | ORAL_TABLET | Freq: Four times a day (QID) | ORAL | 0 refills | Status: DC | PRN

## 2022-02-09 NOTE — Telephone Encounter (Signed)
I sent the rx, f/u with PCP/update [place] if not better soon.  Thanks.

## 2022-02-09 NOTE — Telephone Encounter (Signed)
Left message on vm that rx was sent in. 

## 2022-02-09 NOTE — Telephone Encounter (Signed)
Patient called about the refill.

## 2022-02-10 ENCOUNTER — Telehealth: Payer: Self-pay

## 2022-02-10 NOTE — Progress Notes (Signed)
Chronic Care Management Pharmacy Assistant   Name: Brenda [name]  MRN: [id number] DOB: 12/02/1960  Reason for Encounter: CCM (Hypertension Disease State)   Recent office visits:  01/30/22 Crawford Givens, MD Left sided sciatica Start: NORCO/VICODIN 5-325 MG tablet Start: Prednisone 10 mg Change: Aripiprazole 10 mg vs 5 mg No FU 10/15/21 AWV Telephone 10/08/21  [name], MD AWV Ordered: Fecal occult Blood Change: Aripiprazole 10 mg Stop: Pepcid 20 mg Increase: Vit D to 2000 units FU 6 months  Recent consult visits:  11/13/21 Mackey Birchwood, MD (Cardiology) Coronary artery disease Stop: Ticagrelor 90 mg FU 1 year  Hospital visits:  None in previous 6 months  Medications: Outpatient Encounter Medications as of 02/10/2022  Medication Sig Note   albuterol (PROVENTIL) (2.5 MG/3ML) 0.083% nebulizer solution INHALE 1 VIAL VIA NEBULIZER EVERY 6 HOURS AS NEEDED FOR WHEEZING/SHORTNESS OF BREATH    ARIPiprazole (ABILIFY) 10 MG tablet Take 1 tablet (10 mg total) by mouth daily.    aspirin EC 81 MG EC tablet Take 1 tablet (81 mg total) by mouth daily. Swallow whole.    atorvastatin (LIPITOR) 80 MG tablet TAKE 1 TABLET BY MOUTH EVERYDAY AT BEDTIME    benzonatate (TESSALON) 200 MG capsule Take 1 capsule (200 mg total) by mouth 3 (three) times daily as needed for cough. Swallow whole, to not bite pill    Budeson-Glycopyrrol-Formoterol (BREZTRI AEROSPHERE) 160-9-4.8 MCG/ACT AERO INHALE 2 PUFFS INTO LUNGS 2 TIMES DAILY    Cholecalciferol (VITAMIN D3) 25 MCG (1000 UT) CAPS Take 1 capsule (1,000 Units total) by mouth daily.    dextromethorphan-guaiFENesin (MUCINEX DM) 30-600 MG 12hr tablet Take 1 tablet by mouth 2 (two) times daily as needed for cough.    ENTRESTO 24-26 MG TAKE 1 TABLET BY MOUTH TWICE A DAY    escitalopram (LEXAPRO) 20 MG tablet TAKE 1 AND 1/2 TABLETS DAILY BY MOUTH    fluticasone (FLONASE) 50 MCG/ACT nasal spray PLACE 1 SPRAY INTO BOTH NOSTRILS 2 (TWO) TIMES DAILY    furosemide  (LASIX) 20 MG tablet TAKE 1 TABLET BY MOUTH EVERY DAY    gabapentin (NEURONTIN) 300 MG capsule TAKE 2 CAPSULES IN THE MORNING AND 3 CAPSULES AT NIGHT    HYDROcodone-acetaminophen (NORCO/VICODIN) 5-325 MG tablet Take 0.5-1 tablets by mouth every 6 (six) hours as needed.    JARDIANCE 10 MG TABS tablet TAKE 1 TABLET BY MOUTH DAILY BEFORE BREAKFAST.    LORazepam (ATIVAN) 0.5 MG tablet TAKE 1/2 TO 1 TABLET BY MOUTH TWICE A DAY AS NEEDED FOR ANXIETY    metFORMIN (GLUCOPHAGE) 500 MG tablet Take 500 mg by mouth at bedtime.    metoprolol succinate (TOPROL-XL) 25 MG 24 hr tablet TAKE 1 TABLET (25 MG TOTAL) BY MOUTH DAILY.    Multiple Vitamin (MULTIVITAMIN WITH MINERALS) TABS tablet Take 1 tablet by mouth daily.    nitroGLYCERIN (NITROSTAT) 0.4 MG SL tablet Place 1 tablet (0.4 mg total) under the tongue every 5 (five) minutes as needed. 11/13/2021: Patient has if needed   nystatin (MYCOSTATIN) 100000 UNIT/ML suspension TAKE 1 TEASPOONFUL BY MOUTH 3 TIMES DAILY AS NEEDED FOR MOUTH SORES.    predniSONE (DELTASONE) 10 MG tablet Take 2 a day for 7 days, then 1 a day for 7 days, with food. Don't take with aleve/ibuprofen.    PROAIR RESPICLICK 108 (90 Base) MCG/ACT AEPB INHALE 2 PUFFS INTO THE LUNGS EVERY 6 HOURS AS NEEDED FOR DYSPNEA/WHEEZE    spironolactone (ALDACTONE) 25 MG tablet TAKE 1/2 TABLET BY MOUTH EVERY  DAY    No facility-administered encounter medications on file as of 02/10/2022.    Recent Office Vitals: BP Readings from Last 3 Encounters:  01/30/22 124/80  11/13/21 108/64  10/08/21 128/78   Pulse Readings from Last 3 Encounters:  01/30/22 72  11/13/21 75  10/08/21 77    Wt Readings from Last 3 Encounters:  01/30/22 242 lb (109.8 kg)  11/13/21 243 lb 9.6 oz (110.5 kg)  10/15/21 239 lb (108.4 kg)     Kidney Function Lab Results  Component Value Date/Time   CREATININE 0.99 10/03/2021 08:55 AM   CREATININE 1.00 02/25/2021 11:07 AM   GFR 60.81 10/03/2021 08:55 AM   GFRNONAA >60  11/03/2020 07:19 AM   GFRAA 65 06/10/2020 02:46 PM      Latest Ref Rng & Units 10/03/2021    8:55 AM 02/25/2021   11:07 AM 11/03/2020    7:19 AM  BMP  Glucose 70 - 99 mg/dL 329   924   BUN 6 - 23 mg/dL 14   13   Creatinine 2.68 - 1.20 mg/dL 3.41  9.62  2.29   Sodium 135 - 145 mEq/L 142   139   Potassium 3.5 - 5.1 mEq/L 4.0   3.8   Chloride 96 - 112 mEq/L 103   106   CO2 19 - 32 mEq/L 30   27   Calcium 8.4 - 10.5 mg/dL 9.4   9.2    Contacted patient on 02/10/22 to discuss hypertension disease state  Current antihypertensive regimen:  Furosemide 20 mg daily  Metoprolol succinate 25 mg daily  Entresto 24-26 mg BID  Spironolactone 25 mg - 1/2 tab daily  Jardiance 10 mg daily   Patient verbally confirms she is taking the above medications as directed. Yes  I have asked patient to take their blood pressure daily and keep a log. Advised patient I would call back on 02/13/2022 for log. Patient verbalized understanding and agreed.   Wrist or arm cuff: Wrist Caffeine intake: Drinks 2 cups of coffee per day; no other caffeine Salt intake: Does not use salt Over the counter medications including pseudoephedrine or NSAIDs? Takes Tylenol BID for pain  What recent interventions/DTPs have been made by any provider to improve Blood Pressure control since last CPP Visit: No recent interventions  Any recent hospitalizations or ED visits since last visit with CPP? No  What diet changes have been made to improve Blood Pressure Control?  No diet changes  What exercise is being done to improve your Blood Pressure Control?  Patient does not exercise  Adherence Review: Is the patient currently on ACE/ARB medication? No Does the patient have >5 day gap between last estimated fill dates? No  Star Rating Drugs:  Medication:  Last Fill: Day Supply Atorvastatin 80 mg 12/28/21 90 Jardiance 10 mg 12/17/21 90 Metformin 500 mg Discontinued last year as A1c remains stable per Dr. Sharen Hones on  10/08/21  Care Gaps: Annual wellness visit in last year? Yes 10/15/21 Most Recent BP reading: 124/80 on 01/30/2022  If Diabetic: Most recent A1C reading:6.5 on 10/03/2021 Last eye exam / retinopathy screening: Up to date Last diabetic foot exam: 11/25/2018  Upcoming appointments: PCP appointment on 04/03/22 for Labs PCP appointment on 04/10/22 for Physical CCM appointment on 08/02/21  Summary of recommendations from pervious CCM visit with PharmD:  Summary: CCM F/U vit -Pt reports BP at goal 115-120/60s, and fasting BG at goal 90-115. She endorses compliance with medications and denies issues -Pt is down to  2 cigarettes per day - she uses mainly to help with anxiety   Recommendations/Changes made from today's visit: -No med changes. Encouraged continued efforts to quit smoking  Al Corpus, CPP notified  Claudina Lick, [place] Clinical Pharmacy Assistant [phone]

## 2022-02-23 NOTE — Progress Notes (Signed)
Carelink Summary Report / Loop Recorder 

## 2022-02-24 ENCOUNTER — Telehealth (HOSPITAL_COMMUNITY): Payer: Self-pay

## 2022-02-24 ENCOUNTER — Other Ambulatory Visit (HOSPITAL_COMMUNITY): Payer: Self-pay | Admitting: Interventional Radiology

## 2022-02-24 ENCOUNTER — Telehealth: Payer: Self-pay | Admitting: Family Medicine

## 2022-02-24 DIAGNOSIS — I771 Stricture of artery: Secondary | ICD-10-CM

## 2022-02-24 NOTE — Telephone Encounter (Signed)
Name of Medication: Hydrocodone-APAP Name of Pharmacy: [facility] or Written Date and Quantity: 02/09/22, #20 Last Office Visit and Type: 10/08/21, CPE Next Office Visit and Type: 04/10/22, CPE Last Controlled Substance Agreement Date: none Last UDS: none

## 2022-02-24 NOTE — Telephone Encounter (Signed)
  Encourage patient to contact the pharmacy for refills or they can request refills through [facility]  Did the patient contact the pharmacy:  no   LAST APPOINTMENT DATE:  Please schedule appointment if longer than 1 year  NEXT APPOINTMENT DATE:04/03/2022  MEDICATION:HYDROcodone-acetaminophen (NORCO/VICODIN) 5-325 MG tablet  Is the patient out of medication? yes  If not, how much is left?  Is this a 90 day supply: no  PHARMACY: [id number] [place], [place] - [address] [place] Phone:  [phone]  Fax:  [phone]      Let patient know to contact pharmacy at the end of the day to make sure medication is ready.  Please notify patient to allow 48-72 hours to process

## 2022-02-24 NOTE — Telephone Encounter (Signed)
Called to schedule cta head/neck, no answer, left vm. AW 

## 2022-02-25 ENCOUNTER — Other Ambulatory Visit: Payer: Self-pay | Admitting: Family Medicine

## 2022-02-25 MED ORDER — HYDROCODONE-ACETAMINOPHEN 5-325 MG PO TABS: 0.5000 | ORAL_TABLET | Freq: Three times a day (TID) | ORAL | 0 refills | Status: DC | PRN

## 2022-02-25 NOTE — Telephone Encounter (Signed)
Patient called and was returning a call.  

## 2022-02-25 NOTE — Telephone Encounter (Signed)
Spoke with pt relaying Dr. G's message.  Pt verbalizes understanding and expresses her thanks.  

## 2022-02-25 NOTE — Telephone Encounter (Signed)
Lvm asking pt to call back.  Need to relay Dr. G's message.  

## 2022-02-25 NOTE — Telephone Encounter (Signed)
Saw Dr Damita Dunnings 9/1 with L sciatica, has received 2 courses of hydrocodone, and this refill would be 3rd course.  I've sent to pharmacy but recommend OV if ongoing pain prior to further refills.

## 2022-02-26 NOTE — Telephone Encounter (Signed)
Name of Medication: Lorazepam Name of Pharmacy: [facility] or Written Date and Quantity: 01/28/22, #30 Last Office Visit and Type: 10/08/21, AWV Next Office Visit and Type: 04/10/22, CPE Last Controlled Substance Agreement Date: none Last UDS: none

## 2022-03-01 NOTE — Telephone Encounter (Signed)
ERx 

## 2022-03-06 ENCOUNTER — Ambulatory Visit (HOSPITAL_COMMUNITY)
Admission: RE | Admit: 2022-03-06 | Discharge: 2022-03-06 | Disposition: A | Discharge status: Home / Self Care | Payer: Medicare Other | Source: Ambulatory Visit | Attending: Interventional Radiology | Admitting: Interventional Radiology

## 2022-03-06 DIAGNOSIS — I771 Stricture of artery: Secondary | ICD-10-CM | POA: Insufficient documentation

## 2022-03-06 DIAGNOSIS — I672 Cerebral atherosclerosis: Secondary | ICD-10-CM | POA: Diagnosis not present

## 2022-03-06 DIAGNOSIS — I6503 Occlusion and stenosis of bilateral vertebral arteries: Secondary | ICD-10-CM | POA: Diagnosis not present

## 2022-03-06 DIAGNOSIS — I6523 Occlusion and stenosis of bilateral carotid arteries: Secondary | ICD-10-CM | POA: Diagnosis not present

## 2022-03-06 DIAGNOSIS — K029 Dental caries, unspecified: Secondary | ICD-10-CM | POA: Diagnosis not present

## 2022-03-06 LAB — POCT I-STAT CREATININE: Creatinine, Ser: 1.3 mg/dL — ABNORMAL HIGH (ref 0.44–1.00)

## 2022-03-06 MED ORDER — IOHEXOL 350 MG/ML SOLN
75.0000 mL | Freq: Once | INTRAVENOUS | Status: AC | PRN
  Administered 2022-03-06: 75 mL via INTRAVENOUS

## 2022-03-07 DIAGNOSIS — G4733 Obstructive sleep apnea (adult) (pediatric): Secondary | ICD-10-CM | POA: Diagnosis not present

## 2022-03-09 ENCOUNTER — Ambulatory Visit (INDEPENDENT_AMBULATORY_CARE_PROVIDER_SITE_OTHER): Payer: Medicare Other

## 2022-03-09 DIAGNOSIS — G459 Transient cerebral ischemic attack, unspecified: Secondary | ICD-10-CM

## 2022-03-10 LAB — CUP PACEART REMOTE DEVICE CHECK
Date Time Interrogation Session: 20231008231057
Implantable Pulse Generator Implant Date: 20211102

## 2022-03-11 ENCOUNTER — Telehealth (HOSPITAL_COMMUNITY): Payer: Self-pay

## 2022-03-11 NOTE — Telephone Encounter (Signed)
Called pt regarding recent imaging, no answer, no vm came up. Will try back later AW

## 2022-03-16 NOTE — Progress Notes (Signed)
Carelink Summary Report / Loop Recorder 

## 2022-03-29 ENCOUNTER — Other Ambulatory Visit: Payer: Self-pay | Admitting: Family Medicine

## 2022-03-29 DIAGNOSIS — N1831 Chronic kidney disease, stage 3a: Secondary | ICD-10-CM

## 2022-03-29 DIAGNOSIS — E559 Vitamin D deficiency, unspecified: Secondary | ICD-10-CM

## 2022-03-29 DIAGNOSIS — E781 Pure hyperglyceridemia: Secondary | ICD-10-CM

## 2022-03-29 DIAGNOSIS — E1169 Type 2 diabetes mellitus with other specified complication: Secondary | ICD-10-CM

## 2022-03-31 ENCOUNTER — Telehealth: Payer: Self-pay

## 2022-03-31 NOTE — Chronic Care Management (AMB) (Signed)
Attempted to reach the patient to see if she has had updated DM eye exam. Offered clinic at [facility] in November and left message to call me back if interested.   Avel Sensor, [place]  [phone]

## 2022-04-03 ENCOUNTER — Other Ambulatory Visit (INDEPENDENT_AMBULATORY_CARE_PROVIDER_SITE_OTHER): Payer: Medicare Other

## 2022-04-03 DIAGNOSIS — E559 Vitamin D deficiency, unspecified: Secondary | ICD-10-CM | POA: Diagnosis not present

## 2022-04-03 DIAGNOSIS — N1831 Chronic kidney disease, stage 3a: Secondary | ICD-10-CM | POA: Diagnosis not present

## 2022-04-03 DIAGNOSIS — E781 Pure hyperglyceridemia: Secondary | ICD-10-CM | POA: Diagnosis not present

## 2022-04-03 DIAGNOSIS — E1169 Type 2 diabetes mellitus with other specified complication: Secondary | ICD-10-CM

## 2022-04-03 LAB — LIPID PANEL
Cholesterol: 112 mg/dL (ref 0–200)
HDL: 42.9 mg/dL (ref >39.00)
LDL Cholesterol: 49 mg/dL (ref 0–99)
NonHDL: 69.39
Total CHOL/HDL Ratio: 3
Triglycerides: 104 mg/dL (ref 0.0–149.0)
VLDL: 20.8 mg/dL (ref 0.0–40.0)

## 2022-04-03 LAB — CBC WITH DIFFERENTIAL/PLATELET
Basophils Absolute: 0.1 10*3/uL (ref 0.0–0.1)
Basophils Relative: 0.5 % (ref 0.0–3.0)
Eosinophils Absolute: 0.4 10*3/uL (ref 0.0–0.7)
Eosinophils Relative: 3.4 % (ref 0.0–5.0)
HCT: 46 % (ref 36.0–46.0)
Hemoglobin: 15.1 g/dL — ABNORMAL HIGH (ref 12.0–15.0)
Lymphocytes Relative: 24.7 % (ref 12.0–46.0)
Lymphs Abs: 3.2 10*3/uL (ref 0.7–4.0)
MCHC: 32.8 g/dL (ref 30.0–36.0)
MCV: 89.4 fl (ref 78.0–100.0)
Monocytes Absolute: 0.9 10*3/uL (ref 0.1–1.0)
Monocytes Relative: 6.8 % (ref 3.0–12.0)
Neutro Abs: 8.3 10*3/uL — ABNORMAL HIGH (ref 1.4–7.7)
Neutrophils Relative %: 64.6 % (ref 43.0–77.0)
Platelets: 259 10*3/uL (ref 150.0–400.0)
RBC: 5.14 Mil/uL — ABNORMAL HIGH (ref 3.87–5.11)
RDW: 16.5 % — ABNORMAL HIGH (ref 11.5–15.5)
WBC: 12.9 10*3/uL — ABNORMAL HIGH (ref 4.0–10.5)

## 2022-04-03 LAB — COMPREHENSIVE METABOLIC PANEL
ALT: 13 U/L (ref 0–35)
AST: 11 U/L (ref 0–37)
Albumin: 4.1 g/dL (ref 3.5–5.2)
Alkaline Phosphatase: 119 U/L — ABNORMAL HIGH (ref 39–117)
BUN: 17 mg/dL (ref 6–23)
CO2: 32 mEq/L (ref 19–32)
Calcium: 9.7 mg/dL (ref 8.4–10.5)
Chloride: 100 mEq/L (ref 96–112)
Creatinine, Ser: 1.09 mg/dL (ref 0.40–1.20)
GFR: 53.98 mL/min — ABNORMAL LOW (ref >60.00)
Glucose, Bld: 127 mg/dL — ABNORMAL HIGH (ref 70–99)
Potassium: 4.6 mEq/L (ref 3.5–5.1)
Sodium: 140 mEq/L (ref 135–145)
Total Bilirubin: 0.4 mg/dL (ref 0.2–1.2)
Total Protein: 7.1 g/dL (ref 6.0–8.3)

## 2022-04-03 LAB — VITAMIN D 25 HYDROXY (VIT D DEFICIENCY, FRACTURES): VITD: 30.95 ng/mL (ref 30.00–100.00)

## 2022-04-03 LAB — MICROALBUMIN / CREATININE URINE RATIO
Creatinine,U: 48.7 mg/dL
Microalb Creat Ratio: 1.4 mg/g (ref 0.0–30.0)
Microalb, Ur: <0.7 mg/dL (ref 0.0–1.9)

## 2022-04-03 LAB — HEMOGLOBIN A1C: Hgb A1c MFr Bld: 7 % — ABNORMAL HIGH (ref 4.6–6.5)

## 2022-04-07 DIAGNOSIS — G4733 Obstructive sleep apnea (adult) (pediatric): Secondary | ICD-10-CM | POA: Diagnosis not present

## 2022-04-10 ENCOUNTER — Encounter: Payer: Self-pay | Admitting: Family Medicine

## 2022-04-10 ENCOUNTER — Ambulatory Visit (INDEPENDENT_AMBULATORY_CARE_PROVIDER_SITE_OTHER): Payer: Medicare Other | Admitting: Family Medicine

## 2022-04-10 VITALS — BP 130/64 | HR 73 | Temp 97.2°F | Ht 60.0 in | Wt 252.2 lb

## 2022-04-10 DIAGNOSIS — G894 Chronic pain syndrome: Secondary | ICD-10-CM | POA: Diagnosis not present

## 2022-04-10 DIAGNOSIS — F17210 Nicotine dependence, cigarettes, uncomplicated: Secondary | ICD-10-CM | POA: Diagnosis not present

## 2022-04-10 DIAGNOSIS — M5432 Sciatica, left side: Secondary | ICD-10-CM

## 2022-04-10 DIAGNOSIS — Z23 Encounter for immunization: Secondary | ICD-10-CM | POA: Diagnosis not present

## 2022-04-10 DIAGNOSIS — J9611 Chronic respiratory failure with hypoxia: Secondary | ICD-10-CM

## 2022-04-10 DIAGNOSIS — M5442 Lumbago with sciatica, left side: Secondary | ICD-10-CM

## 2022-04-10 DIAGNOSIS — I700 Atherosclerosis of aorta: Secondary | ICD-10-CM | POA: Diagnosis not present

## 2022-04-10 DIAGNOSIS — M5441 Lumbago with sciatica, right side: Secondary | ICD-10-CM | POA: Diagnosis not present

## 2022-04-10 DIAGNOSIS — K760 Fatty (change of) liver, not elsewhere classified: Secondary | ICD-10-CM | POA: Diagnosis not present

## 2022-04-10 DIAGNOSIS — E1169 Type 2 diabetes mellitus with other specified complication: Secondary | ICD-10-CM | POA: Diagnosis not present

## 2022-04-10 DIAGNOSIS — G8929 Other chronic pain: Secondary | ICD-10-CM

## 2022-04-10 DIAGNOSIS — J449 Chronic obstructive pulmonary disease, unspecified: Secondary | ICD-10-CM

## 2022-04-10 MED ORDER — LORAZEPAM 0.5 MG PO TABS: 0.5000 mg | ORAL_TABLET | Freq: Every day | ORAL | 0 refills | Status: DC | PRN

## 2022-04-10 MED ORDER — ESCITALOPRAM OXALATE 20 MG PO TABS: 20.0000 mg | ORAL_TABLET | Freq: Every day | ORAL | 1 refills | Status: DC

## 2022-04-10 MED ORDER — TRAMADOL HCL 50 MG PO TABS: 50.0000 mg | ORAL_TABLET | Freq: Three times a day (TID) | ORAL | 0 refills | Status: DC | PRN

## 2022-04-10 NOTE — Progress Notes (Unsigned)
Patient ID: Brenda [name], female    DOB: 04-07-1961, 61 y.o.   MRN: [id number]  This visit was conducted in person.  BP 130/64   Pulse 73   Temp (!) 97.2 F (36.2 C) (Temporal)   Ht 5' (1.524 m)   Wt 252 lb 3.2 oz (114.4 kg)   LMP 03/08/2013   SpO2 97% Comment: 2 L O2, continuous  BMI 49.25 kg/m    CC: 6 mo DM f/u visit  Subjective:   HPI: Brenda [name] is a 61 y.o. female presenting on 04/10/2022 for Diabetes (Here for 6 mo f/u.)   Ongoing lower back pain with radiation down bilateral buttock managing with tylenol 500mg  TID. Notes she needs to lean on counter when washing dishes as well as leaning on cart when she goes grocery shopping. Some numbness to feet, shooting pain worse on left. Known severe DDD and spinal stenosis - she saw neurosurgeon 2019 but declined surgery at that time. Notes neurogenic claudication. She continues gabapentin 600mg  in am and 900mg  at night.   COPD - regularly sees Dr 2020- due for OV as last seen 08/2019  DM - does regularly check sugars once week, latest 129 this morning. Compliant with antihyperglycemic regimen which includes: jardiance 10mg  daily. Metformin 500mg  stopped about a year ago. Denies low sugars or hypoglycemic symptoms. Denies blurry vision. Last diabetic eye exam DUE. Glucometer brand: Relion. Last foot exam: DUE. DSME: declined. Lab Results  Component Value Date   HGBA1C 7.0 (H) 04/03/2022   Diabetic Foot Exam - Simple   Simple Foot Form Diabetic Foot exam was performed with the following findings: Yes 04/10/2022  4:38 PM  Visual Inspection No deformities, no ulcerations, no other skin breakdown bilaterally: Yes Sensation Testing Intact to touch and monofilament testing bilaterally: Yes Pulse Check Posterior Tibialis and Dorsalis pulse intact bilaterally: Yes Comments    Lab Results  Component Value Date   MICROALBUR <0.7 04/03/2022         Relevant past medical, surgical, family and social history reviewed and  updated as indicated. Interim medical history since our last visit reviewed. Allergies and medications reviewed and updated. Outpatient Medications Prior to Visit  Medication Sig Dispense Refill   albuterol (PROVENTIL) (2.5 MG/3ML) 0.083% nebulizer solution INHALE 1 VIAL VIA NEBULIZER EVERY 6 HOURS AS NEEDED FOR WHEEZING/SHORTNESS OF BREATH 150 mL 1   ARIPiprazole (ABILIFY) 10 MG tablet Take 1 tablet (10 mg total) by mouth daily. 90 tablet 3   aspirin EC 81 MG EC tablet Take 1 tablet (81 mg total) by mouth daily. Swallow whole. 30 tablet 0   atorvastatin (LIPITOR) 80 MG tablet TAKE 1 TABLET BY MOUTH EVERYDAY AT BEDTIME 90 tablet 1   benzonatate (TESSALON) 200 MG capsule Take 1 capsule (200 mg total) by mouth 3 (three) times daily as needed for cough. Swallow whole, to not bite pill 45 capsule 2   Budeson-Glycopyrrol-Formoterol (BREZTRI AEROSPHERE) 160-9-4.8 MCG/ACT AERO INHALE 2 PUFFS INTO LUNGS 2 TIMES DAILY 5.9 g 11   Cholecalciferol (VITAMIN D3) 25 MCG (1000 UT) CAPS Take 1 capsule (1,000 Units total) by mouth daily. 30 capsule    dextromethorphan-guaiFENesin (MUCINEX DM) 30-600 MG 12hr tablet Take 1 tablet by mouth 2 (two) times daily as needed for cough.     ENTRESTO 24-26 MG TAKE 1 TABLET BY MOUTH TWICE A DAY 180 tablet 1   fluticasone (FLONASE) 50 MCG/ACT nasal spray PLACE 1 SPRAY INTO BOTH NOSTRILS 2 (TWO) TIMES DAILY 48 mL 1  furosemide (LASIX) 20 MG tablet TAKE 1 TABLET BY MOUTH EVERY DAY 90 tablet 3   gabapentin (NEURONTIN) 300 MG capsule TAKE 2 CAPSULES IN THE MORNING AND 3 CAPSULES AT NIGHT 450 capsule 0   JARDIANCE 10 MG TABS tablet TAKE 1 TABLET BY MOUTH DAILY BEFORE BREAKFAST. 90 tablet 3   metoprolol succinate (TOPROL-XL) 25 MG 24 hr tablet TAKE 1 TABLET (25 MG TOTAL) BY MOUTH DAILY. 90 tablet 1   Multiple Vitamin (MULTIVITAMIN WITH MINERALS) TABS tablet Take 1 tablet by mouth daily.     nitroGLYCERIN (NITROSTAT) 0.4 MG SL tablet Place 1 tablet (0.4 mg total) under the tongue  every 5 (five) minutes as needed. 25 tablet 2   nystatin (MYCOSTATIN) 100000 UNIT/ML suspension TAKE 1 TEASPOONFUL BY MOUTH 3 TIMES DAILY AS NEEDED FOR MOUTH SORES. 150 mL 1   predniSONE (DELTASONE) 10 MG tablet Take 2 a day for 7 days, then 1 a day for 7 days, with food. Don't take with aleve/ibuprofen. 21 tablet 0   PROAIR RESPICLICK 108 (90 Base) MCG/ACT AEPB INHALE 2 PUFFS INTO THE LUNGS EVERY 6 HOURS AS NEEDED FOR DYSPNEA/WHEEZE 1 each 1   spironolactone (ALDACTONE) 25 MG tablet TAKE 1/2 TABLET BY MOUTH EVERY DAY 45 tablet 3   escitalopram (LEXAPRO) 20 MG tablet TAKE 1 AND 1/2 TABLETS DAILY BY MOUTH 135 tablet 1   HYDROcodone-acetaminophen (NORCO/VICODIN) 5-325 MG tablet Take 0.5-1 tablets by mouth 3 (three) times daily as needed. 20 tablet 0   LORazepam (ATIVAN) 0.5 MG tablet TAKE 1/2 TO 1 TABLET BY MOUTH TWICE A DAY AS NEEDED FOR ANXIETY 30 tablet 0   metFORMIN (GLUCOPHAGE) 500 MG tablet Take 500 mg by mouth at bedtime.     No facility-administered medications prior to visit.     Per HPI unless specifically indicated in ROS section below Review of Systems  Objective:  BP 130/64   Pulse 73   Temp (!) 97.2 F (36.2 C) (Temporal)   Ht 5' (1.524 m)   Wt 252 lb 3.2 oz (114.4 kg)   LMP 03/08/2013   SpO2 97% Comment: 2 L O2, continuous  BMI 49.25 kg/m   Wt Readings from Last 3 Encounters:  04/10/22 252 lb 3.2 oz (114.4 kg)  01/30/22 242 lb (109.8 kg)  11/13/21 243 lb 9.6 oz (110.5 kg)      Physical Exam Vitals and nursing note reviewed.  Constitutional:      Appearance: Normal appearance. She is obese. She is not ill-appearing.     Comments: Supplemental O2 via [place]  HENT:     Head: Normocephalic and atraumatic.     Mouth/Throat:     Mouth: Mucous membranes are moist.     Pharynx: Oropharynx is clear. No oropharyngeal exudate or posterior oropharyngeal erythema.  Eyes:     Extraocular Movements: Extraocular movements intact.     Pupils: Pupils are equal, round, and reactive  to light.  Cardiovascular:     Rate and Rhythm: Normal rate and regular rhythm.     Pulses: Normal pulses.     Heart sounds: Normal heart sounds. No murmur heard. Pulmonary:     Effort: Pulmonary effort is normal. No respiratory distress.     Breath sounds: Wheezing (exp throughout) present. No rhonchi or rales.  Musculoskeletal:     Right lower leg: No edema.     Left lower leg: No edema.  Skin:    General: Skin is warm and dry.     Findings: No rash.  Neurological:  Mental Status: She is alert.  Psychiatric:        Mood and Affect: Mood normal.        Behavior: Behavior normal.       Results for orders placed or performed in visit on 04/03/22  CBC with Differential/Platelet  Result Value Ref Range   WBC 12.9 (H) 4.0 - 10.5 K/uL   RBC 5.14 (H) 3.87 - 5.11 Mil/uL   Hemoglobin 15.1 (H) 12.0 - 15.0 g/dL   HCT 25.9 56.3 - 87.5 %   MCV 89.4 78.0 - 100.0 fl   MCHC 32.8 30.0 - 36.0 g/dL   RDW 64.3 (H) 32.9 - 51.8 %   Platelets 259.0 150.0 - 400.0 K/uL   Neutrophils Relative % 64.6 43.0 - 77.0 %   Lymphocytes Relative 24.7 12.0 - 46.0 %   Monocytes Relative 6.8 3.0 - 12.0 %   Eosinophils Relative 3.4 0.0 - 5.0 %   Basophils Relative 0.5 0.0 - 3.0 %   Neutro Abs 8.3 (H) 1.4 - 7.7 K/uL   Lymphs Abs 3.2 0.7 - 4.0 K/uL   Monocytes Absolute 0.9 0.1 - 1.0 K/uL   Eosinophils Absolute 0.4 0.0 - 0.7 K/uL   Basophils Absolute 0.1 0.0 - 0.1 K/uL  VITAMIN D 25 Hydroxy (Vit-D Deficiency, Fractures)  Result Value Ref Range   VITD 30.95 30.00 - 100.00 ng/mL  Microalbumin / creatinine urine ratio  Result Value Ref Range   Microalb, Ur <0.7 0.0 - 1.9 mg/dL   Creatinine,U 84.1 mg/dL   Microalb Creat Ratio 1.4 0.0 - 30.0 mg/g  Hemoglobin A1c  Result Value Ref Range   Hgb A1c MFr Bld 7.0 (H) 4.6 - 6.5 %  Comprehensive metabolic panel  Result Value Ref Range   Sodium 140 135 - 145 mEq/L   Potassium 4.6 3.5 - 5.1 mEq/L   Chloride 100 96 - 112 mEq/L   CO2 32 19 - 32 mEq/L   Glucose,  Bld 127 (H) 70 - 99 mg/dL   BUN 17 6 - 23 mg/dL   Creatinine, Ser 6.60 0.40 - 1.20 mg/dL   Total Bilirubin 0.4 0.2 - 1.2 mg/dL   Alkaline Phosphatase 119 (H) 39 - 117 U/L   AST 11 0 - 37 U/L   ALT 13 0 - 35 U/L   Total Protein 7.1 6.0 - 8.3 g/dL   Albumin 4.1 3.5 - 5.2 g/dL   GFR 63.01 (L) >60.10 mL/min   Calcium 9.7 8.4 - 10.5 mg/dL  Lipid panel  Result Value Ref Range   Cholesterol 112 0 - 200 mg/dL   Triglycerides 932.3 0.0 - 149.0 mg/dL   HDL 55.73 >22.02 mg/dL   VLDL 54.2 0.0 - 70.6 mg/dL   LDL Cholesterol 49 0 - 99 mg/dL   Total CHOL/HDL Ratio 3    NonHDL 69.39     Assessment & Plan:   Problem List Items Addressed This Visit     Type 2 diabetes mellitus (HCC)   Other Visit Diagnoses     Need for influenza vaccination    -  Primary   Relevant Orders   Flu Vaccine QUAD 59mo+IM (Fluarix, Fluzone & Alfiuria Quad PF) (Completed)        Meds ordered this encounter  Medications   escitalopram (LEXAPRO) 20 MG tablet    Sig: Take 1 tablet (20 mg total) by mouth daily.    Dispense:  90 tablet    Refill:  1   LORazepam (ATIVAN) 0.5 MG tablet  Sig: Take 1 tablet (0.5 mg total) by mouth daily as needed for anxiety.    Dispense:  30 tablet    Refill:  0    Not to exceed 5 additional fills before 07/27/2022   traMADol (ULTRAM) 50 MG tablet    Sig: Take 1 tablet (50 mg total) by mouth every 8 (eight) hours as needed for up to 5 days for moderate pain.    Dispense:  20 tablet    Refill:  0   Orders Placed This Encounter  Procedures   Flu Vaccine QUAD 55mo+IM (Fluarix, Fluzone & Alfiuria Quad PF)     Patient Instructions  Flu shot  Price out trulicity 0.75mg  weekly for diabetes and weight loss.  Continue jardiance.  Keep appointment for next week for eye exam.  Get RSV shot at local pharmacy.  Schedule appointment with Dr Sherene Sires.   Follow up plan: Return in about 3 months (around 07/11/2022) for follow up visit.  [name], MD

## 2022-04-10 NOTE — Patient Instructions (Addendum)
Flu shot  Price out trulicity 0.75mg  weekly for diabetes and weight loss.  Continue jardiance.  Keep appointment for next week for eye exam.  Get RSV shot at local pharmacy.  Schedule appointment with Dr Sherene Sires.

## 2022-04-11 DIAGNOSIS — M545 Low back pain, unspecified: Secondary | ICD-10-CM | POA: Insufficient documentation

## 2022-04-11 DIAGNOSIS — K760 Fatty (change of) liver, not elsewhere classified: Secondary | ICD-10-CM | POA: Insufficient documentation

## 2022-04-11 DIAGNOSIS — I700 Atherosclerosis of aorta: Secondary | ICD-10-CM | POA: Insufficient documentation

## 2022-04-11 NOTE — Assessment & Plan Note (Signed)
Ongoing symptoms. See above.

## 2022-04-11 NOTE — Assessment & Plan Note (Signed)
Chronic, stable period on jardiance 10mg  daily. Metformin previously stopped due to good glycemic control. Notes difficulty with weight loss. Reviewed option of weekly injectable GLP1RA, mechanism of action, and side effects/adverse effects to monitor for. In remote h/o pancreatitis, will closely monitor for this.  Rx trulicity to price out. Continue jardiance. Foot exam today. Upcoming eye exam in near future.

## 2022-04-11 NOTE — Assessment & Plan Note (Signed)
Continues continual supplemental oxygen via [place]

## 2022-04-11 NOTE — Assessment & Plan Note (Signed)
Remains abstinent.  Weight gain noted since quitting smoking.  Has previously declined lung cancer screening.

## 2022-04-11 NOTE — Assessment & Plan Note (Addendum)
See above. Known DDD with lumbar spinal stenosis.  Declined neurosurgical intervention 2019.

## 2022-04-11 NOTE — Assessment & Plan Note (Addendum)
Severe hepatic steatosis by CT 2019.   Fibrosis 4 Score = .74 (Low risk)      Interpretation for patients with NAFLD          <1.30       -  F0-F1 (Low risk)          1.30-2.67 -  Indeterminate           >2.67      -  F3-F4 (High risk)     Validated for ages 7-65

## 2022-04-11 NOTE — Assessment & Plan Note (Addendum)
Chronic severe oxygen dependent. Quit smoking 09/2021. Continues controller inhaler breztri. Encouraged pulm f/u as overdue (last seen 08/2019) She will get RSV at local pharmacy.

## 2022-04-11 NOTE — Assessment & Plan Note (Signed)
Continue atorvastatin

## 2022-04-11 NOTE — Assessment & Plan Note (Signed)
Ongoing lower back pain with L>R sciatica.  Known lumbar DDD with spinal stenosis, last evaluated by neurosurgery 2019 but she declined surgery at that time. Consider PM&R eval.  For now, continue scheduled tylenol, gabapentin 600/900mg  daily, will also Rx tramadol for breakthrough pain. Reviewed monitoring for signs of serotonin syndrome in lexapro use.

## 2022-04-11 NOTE — Assessment & Plan Note (Addendum)
Discussed weight gain noted - trial GLP1RA as per below.

## 2022-04-13 ENCOUNTER — Ambulatory Visit (INDEPENDENT_AMBULATORY_CARE_PROVIDER_SITE_OTHER): Payer: Medicare Other

## 2022-04-13 DIAGNOSIS — G459 Transient cerebral ischemic attack, unspecified: Secondary | ICD-10-CM | POA: Diagnosis not present

## 2022-04-14 LAB — CUP PACEART REMOTE DEVICE CHECK
Date Time Interrogation Session: 20231112230815
Implantable Pulse Generator Implant Date: 20211102

## 2022-04-15 ENCOUNTER — Telehealth: Payer: Self-pay | Admitting: Family Medicine

## 2022-04-15 LAB — HM DIABETES EYE EXAM

## 2022-04-15 NOTE — Telephone Encounter (Signed)
Pt came by office for eye exam on 04/15/22 & stated Dr. Reece Agar could go ahead & start her on the "TRULICITY" meds. Call back # [phone]

## 2022-04-16 MED ORDER — TRULICITY 0.75 MG/0.5ML ~~LOC~~ SOAJ: 0.7500 mg | SUBCUTANEOUS | 6 refills | Status: DC

## 2022-04-16 NOTE — Telephone Encounter (Signed)
Noted. Thanks.

## 2022-04-16 NOTE — Telephone Encounter (Signed)
Pt rtn call.  States Trulicity cost her nothing so she will take it.  Also, pt wants to make Dr. Reece Agar aware she has eye exam scheduled on 05/18/22.

## 2022-04-16 NOTE — Addendum Note (Signed)
Addended by: [name] on: 04/16/2022 12:06 PM   Modules accepted: Orders

## 2022-04-16 NOTE — Telephone Encounter (Signed)
Trulicity sent to local pharmacy to price out

## 2022-04-16 NOTE — Telephone Encounter (Signed)
Spoke with pt relaying Dr. Timoteo Expose message.  Pt verbalizes understanding and will let us know if it's doable or not.

## 2022-04-21 ENCOUNTER — Encounter: Payer: Self-pay | Admitting: Primary Care

## 2022-04-21 ENCOUNTER — Telehealth: Payer: Self-pay

## 2022-04-21 NOTE — Telephone Encounter (Signed)
See scanned report. Eye exam done and reviewed by Dr Reece Agar. No diabetic retinopathy. Patient advised. Repeat in 1 year per Dr Reece Agar

## 2022-04-25 ENCOUNTER — Other Ambulatory Visit: Payer: Self-pay | Admitting: Family Medicine

## 2022-04-25 ENCOUNTER — Other Ambulatory Visit: Payer: Self-pay | Admitting: Cardiovascular Disease

## 2022-04-28 ENCOUNTER — Other Ambulatory Visit: Payer: Self-pay | Admitting: Family Medicine

## 2022-04-28 DIAGNOSIS — F39 Unspecified mood [affective] disorder: Secondary | ICD-10-CM

## 2022-04-28 DIAGNOSIS — E781 Pure hyperglyceridemia: Secondary | ICD-10-CM

## 2022-04-29 ENCOUNTER — Other Ambulatory Visit: Payer: Self-pay | Admitting: Family Medicine

## 2022-04-30 NOTE — Telephone Encounter (Signed)
Too early for Lexapro.  Rx sent on 04/10/22, #135/1 to [facility]  Refill denied.

## 2022-05-01 NOTE — Telephone Encounter (Signed)
Name of Medication: Tramadol Name of Pharmacy: [facility] [address] Last Fill or Written Date and Quantity: 04/10/22, #20 Last Office Visit and Type: 04/10/22, 6 mo DM f/u Next Office Visit and Type: 07/13/22, 3 mo DM f/u Last Controlled Substance Agreement Date: none Last UDS: none  Albuterol neb last filled:  04/29/22, #150 mL

## 2022-05-03 NOTE — Telephone Encounter (Signed)
ERx 

## 2022-05-07 DIAGNOSIS — G4733 Obstructive sleep apnea (adult) (pediatric): Secondary | ICD-10-CM | POA: Diagnosis not present

## 2022-05-14 ENCOUNTER — Ambulatory Visit (INDEPENDENT_AMBULATORY_CARE_PROVIDER_SITE_OTHER): Payer: Medicare Other

## 2022-05-14 ENCOUNTER — Telehealth: Payer: Self-pay | Admitting: Family Medicine

## 2022-05-14 DIAGNOSIS — G459 Transient cerebral ischemic attack, unspecified: Secondary | ICD-10-CM

## 2022-05-14 LAB — CUP PACEART REMOTE DEVICE CHECK
Date Time Interrogation Session: 20231213230657
Implantable Pulse Generator Implant Date: 20211102

## 2022-05-14 NOTE — Telephone Encounter (Signed)
Patient called and stated she has an eye appointment on Monday at [facility] and they are needing an office note from her last visit. Call back number [phone].

## 2022-05-14 NOTE — Telephone Encounter (Addendum)
Faxed 04/10/22 OV notes to [facility] at [phone].

## 2022-05-16 ENCOUNTER — Other Ambulatory Visit: Payer: Self-pay | Admitting: Family Medicine

## 2022-05-18 DIAGNOSIS — H2513 Age-related nuclear cataract, bilateral: Secondary | ICD-10-CM | POA: Diagnosis not present

## 2022-05-18 DIAGNOSIS — H524 Presbyopia: Secondary | ICD-10-CM | POA: Diagnosis not present

## 2022-05-18 DIAGNOSIS — H25013 Cortical age-related cataract, bilateral: Secondary | ICD-10-CM | POA: Diagnosis not present

## 2022-05-18 DIAGNOSIS — E119 Type 2 diabetes mellitus without complications: Secondary | ICD-10-CM | POA: Diagnosis not present

## 2022-05-18 DIAGNOSIS — H04123 Dry eye syndrome of bilateral lacrimal glands: Secondary | ICD-10-CM | POA: Diagnosis not present

## 2022-05-18 LAB — HM DIABETES EYE EXAM

## 2022-05-18 NOTE — Telephone Encounter (Signed)
Name of Medication: Lorazepam, Tramadol Name of Pharmacy: [facility] [address] Last Fill or Written Date and Quantity:       Lorazepam- 04/10/22, #30      Tramadol- 05/04/22, #20 Last Office Visit and Type: 04/10/22, 6 mo f/u Next Office Visit and Type: 07/13/22, 3 mo DM f/u Last Controlled Substance Agreement Date: none Last UDS: none

## 2022-05-19 ENCOUNTER — Encounter: Payer: Self-pay | Admitting: Family Medicine

## 2022-05-19 NOTE — Telephone Encounter (Signed)
ERx 

## 2022-05-27 NOTE — Progress Notes (Signed)
Carelink Summary Report / Loop Recorder 

## 2022-05-31 IMAGING — RF DG ESOPHAGUS
13 of 14 series · 18 of 24 positions shown · non-contrast
Comparison: Chest CT of 01/31/2020

CLINICAL DATA: Shortness of breath weakness.

EXAM:
ESOPHOGRAM/BARIUM SWALLOW
TECHNIQUE: Single contrast examination was performed using  thin barium.
FLUOROSCOPY TIME:  Fluoroscopy Time:  1 minutes 42 seconds
Radiation Exposure Index (if provided by the fluoroscopic device):
14.3 mGy
Number of Acquired Spot Images: 0

[Series 12: cp_standard · 0.35mm/px · 1 of 34 frames shown (1 of 13)]
[frame 6/34]
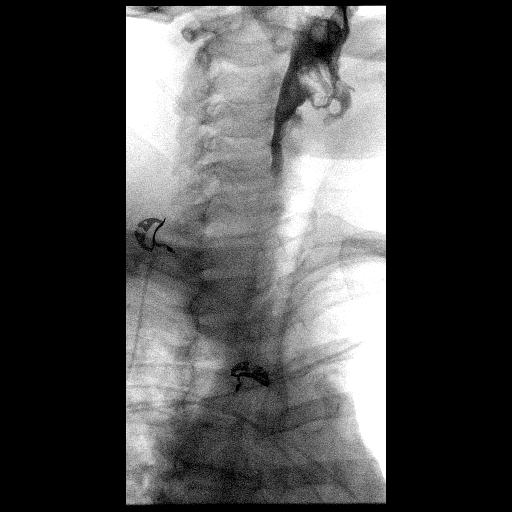

[Series 13: cp_standard · 0.34mm/px · 2 of 31 frames shown (2 of 13)]
[frame 8/31]
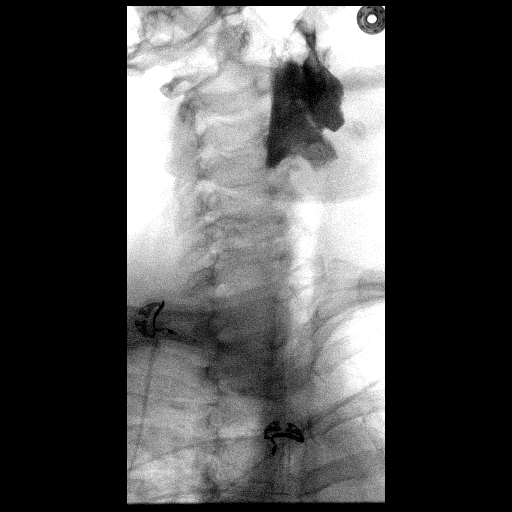
[frame 27/31]
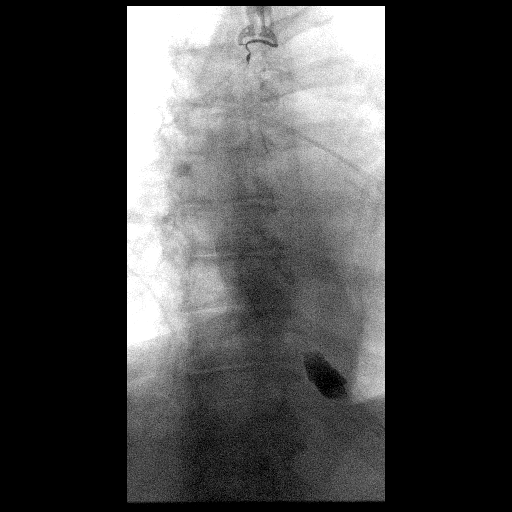

[Series 14: cp_standard · 0.34mm/px · 1 of 10 frames shown (3 of 13)]
[frame 6/10]
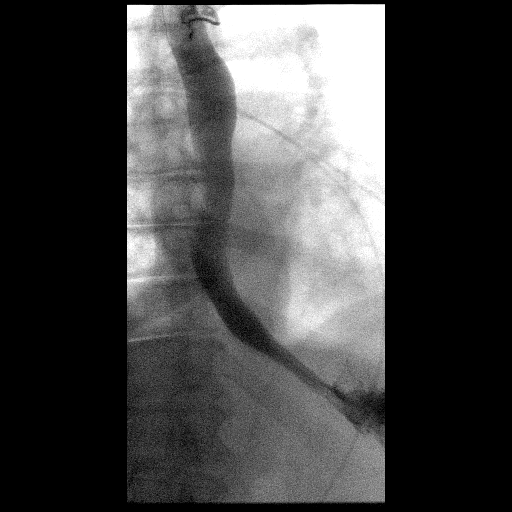

[Series 15: cp_standard · 0.35mm/px · 1 of 23 frames shown (4 of 13)]
[frame 12/23]
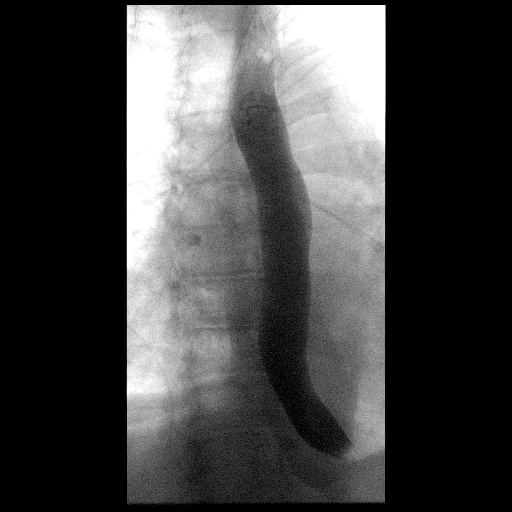

[Series 16: cp_standard · 0.52mm/px · 2 of 17 frames shown (5 of 13)]
[frame 2/17]
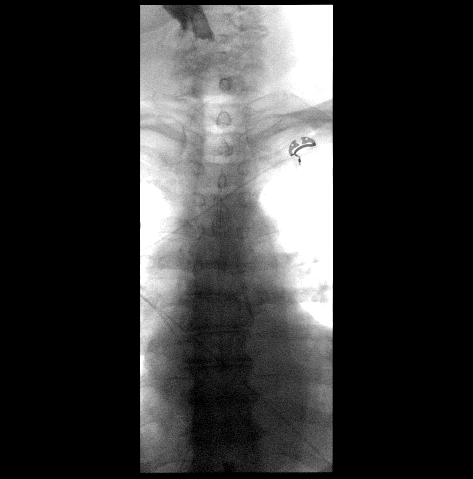
[frame 9/17]
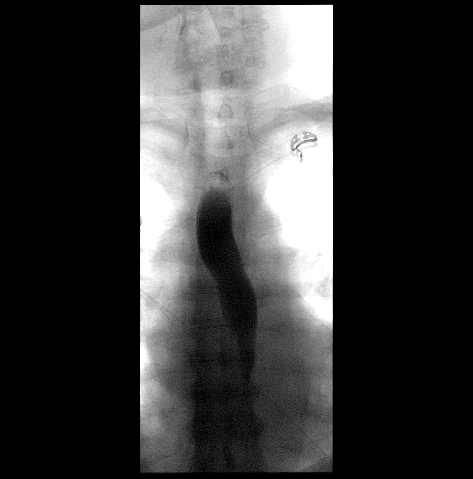

[Series 17: cp_standard · 0.35mm/px · 1 of 27 frames shown (6 of 13)]
[frame 14/27]
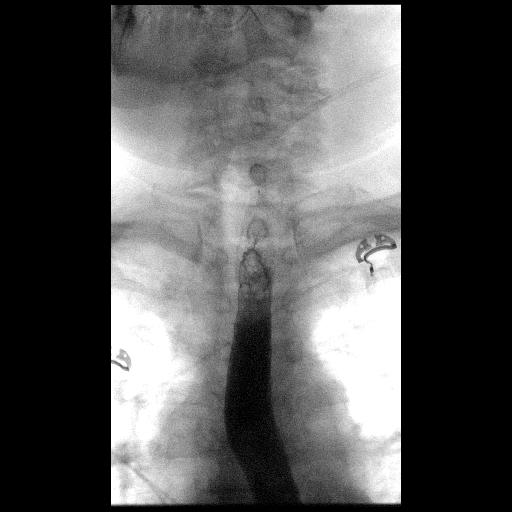

[Series 18: cp_standard · 0.35mm/px · 2 of 2 frames shown (7 of 13)]
[frame 1/2]
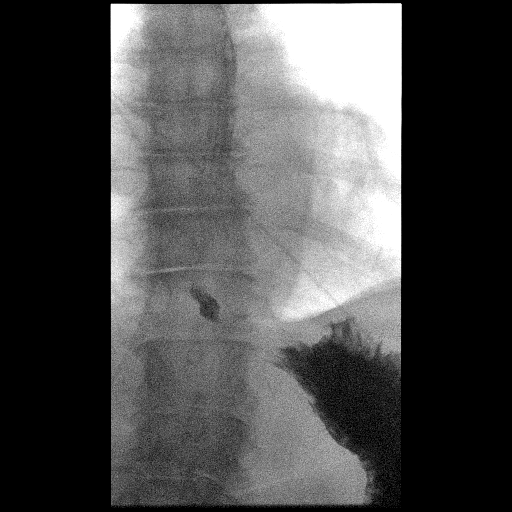
[frame 2/2]
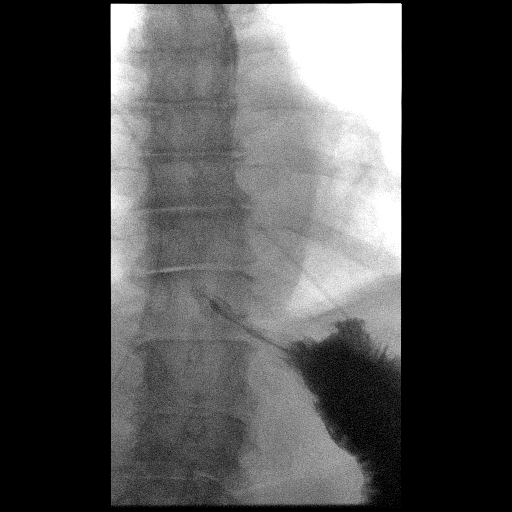

[Series 19: cp_standard · 0.51mm/px · 1 of 23 frames shown (8 of 13)]
[frame 20/23]
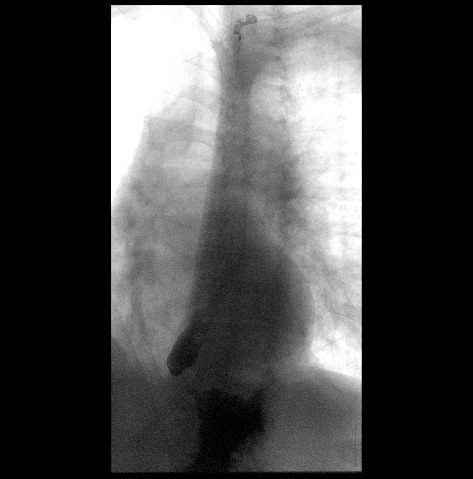

[Series 20: cp_standard · 0.51mm/px · 2 of 14 frames shown (9 of 13)]
[frame 8/14]
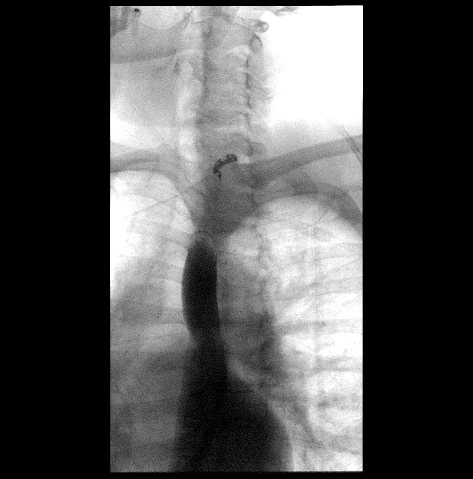
[frame 13/14]
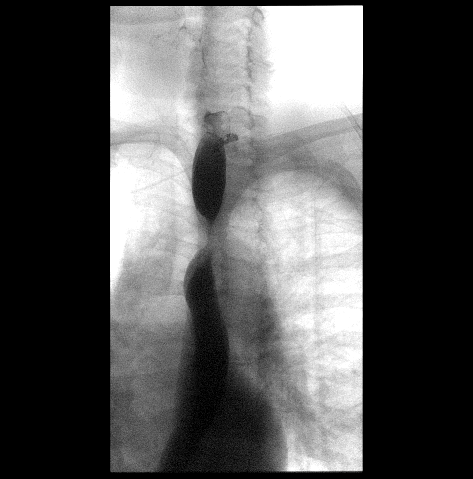

[Series 22: cp_standard · 0.52mm/px · 1 of 7 frames shown (10 of 13)]
[frame 4/7]
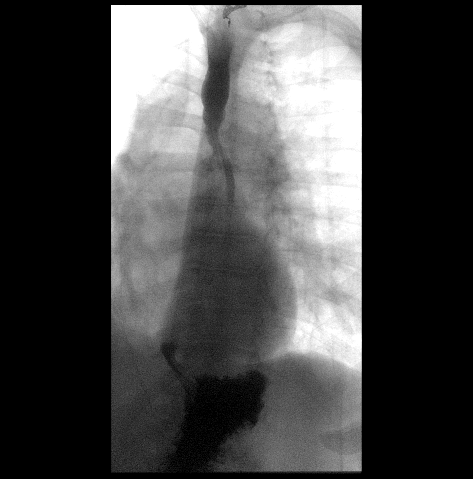

[Series 23: cp_standard · 0.52mm/px · 2 of 6 frames shown (11 of 13)]
[frame 1/6]
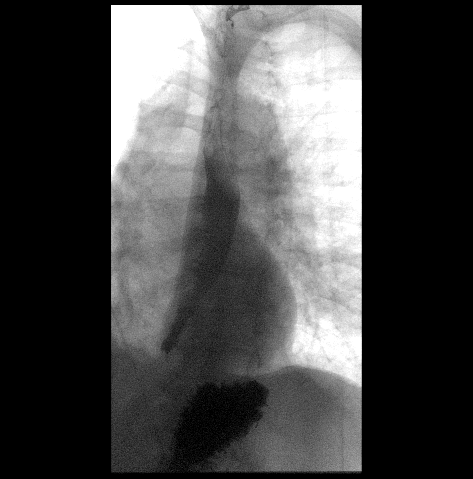
[frame 4/6]
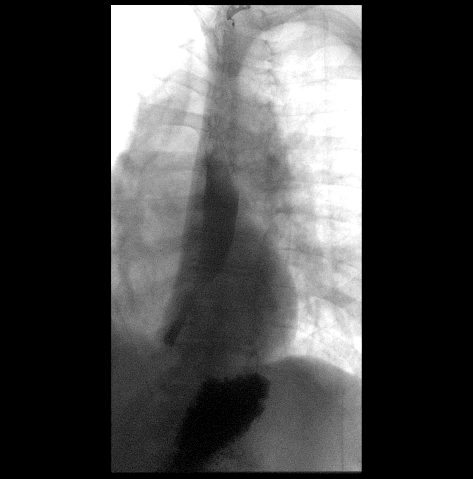

[Series 24: cp_standard · 0.52mm/px · 1 of 25 frames shown (12 of 13)]
[frame 13/25]
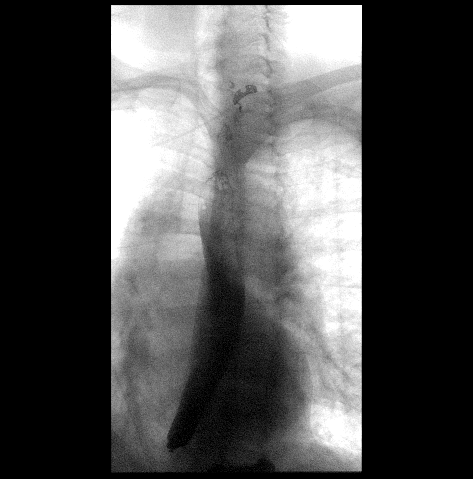

[Series 25: cp_standard · 0.17mm/px · 1 of 1 slices shown (13 of 13)]
[im 1/1]
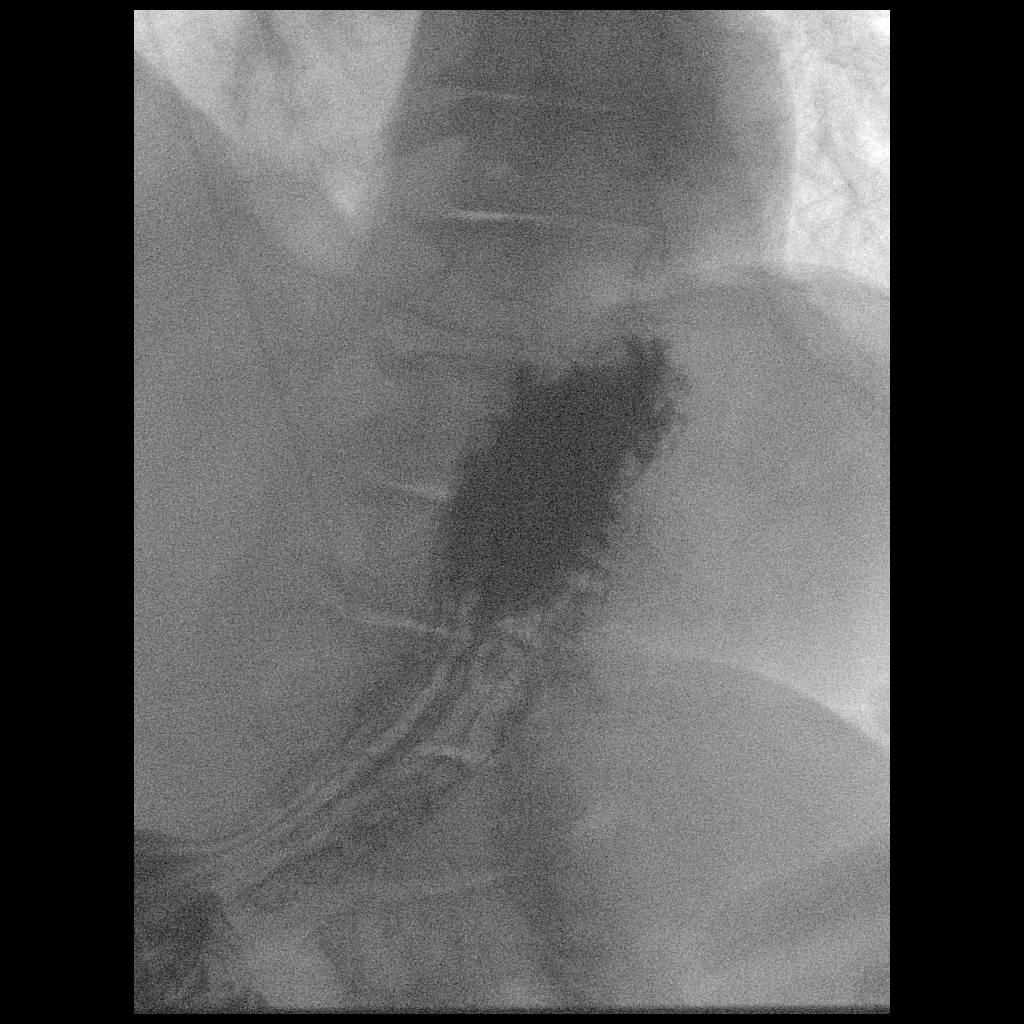

[18 of 24 positions shown; findings below may reference images not displayed]

FINDINGS: Esophagus grossly normal. Limited assessment in semi recumbent
positioning as the patient could not stand for the evaluation.
Distensibility is normal. Mild deviation near the cardiac silhouette
likely due to LEFT atrial enlargement based on comparison with
recent CT. Motility could not be fully assessed as the patient was
unable to lie in the prone RAO position due to current dyspnea.
However, on the last swallow obtained there was proximal escape of
the bolus with head of bed slightly elevated in moderate tertiary
peristaltic activity compatible with esophageal dysmotility.
IMPRESSION: 1. Limited study without abnormality aside from signs of
mild-to-moderate dysmotility. If there are continued symptoms that
would suggest esophageal pathology a study as an outpatient could be
performed for more complete assessment when the patient has
recovered from this episode of acute illness.

## 2022-06-05 NOTE — Progress Notes (Signed)
Carelink Summary Report / Loop Recorder 

## 2022-06-06 IMAGING — XA IR ANGIO INTRA EXTRACRAN SEL COM CAROTID INNOMINATE BILAT MOD SE
1 series · 12 of 24 positions shown · IV contrast (IODINE)
Comparison: CT angiogram of the head and neck February 07, 2020.

CLINICAL DATA: History of vertebrobasilar ischemia with transient
diplopia, vertigo, dizziness, and gait imbalance. High-grade
stenosis of the distal basilar artery on CT angiogram of the head
and neck.

EXAM:
BILATERAL COMMON CAROTID AND INNOMINATE ANGIOGRAPHY
TECHNIQUE: Informed written consent was obtained from the patient after a
thorough discussion of the procedural risks, benefits and
alternatives. All questions were addressed. Maximal Sterile Barrier
Technique was utilized including caps, mask, sterile gowns, sterile
gloves, sterile drape, hand hygiene and skin antiseptic. A timeout
was performed prior to the initiation of the procedure.

[Series 300: dr. (person_name) · 12 of 232 slices shown]
[im 11/232]
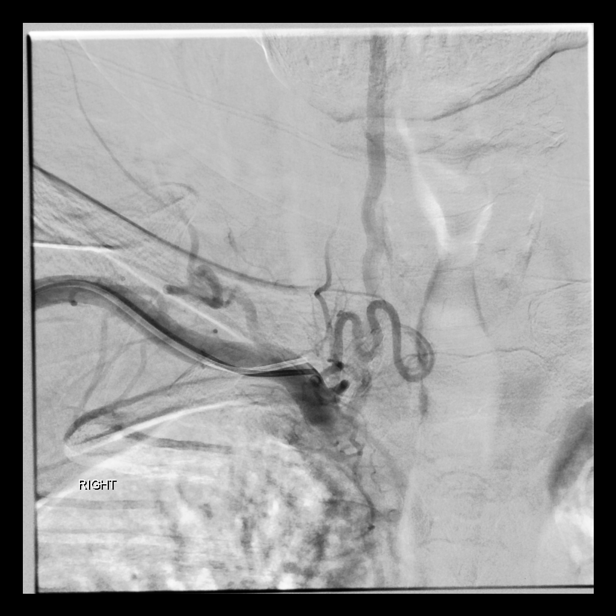
[im 31/232]
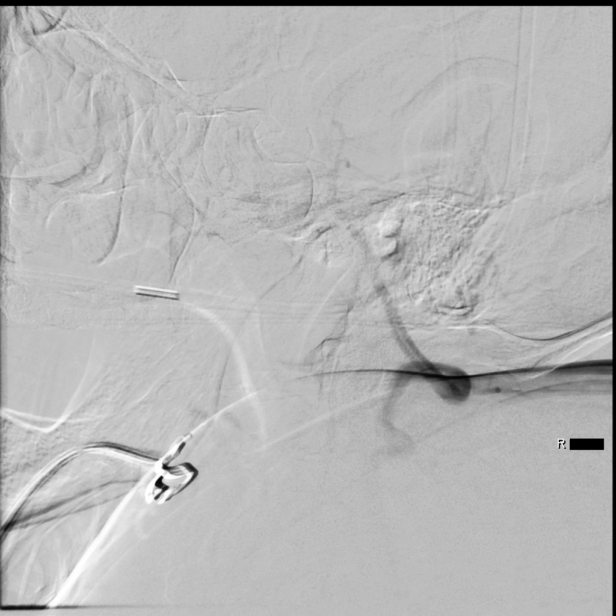
[im 51/232]
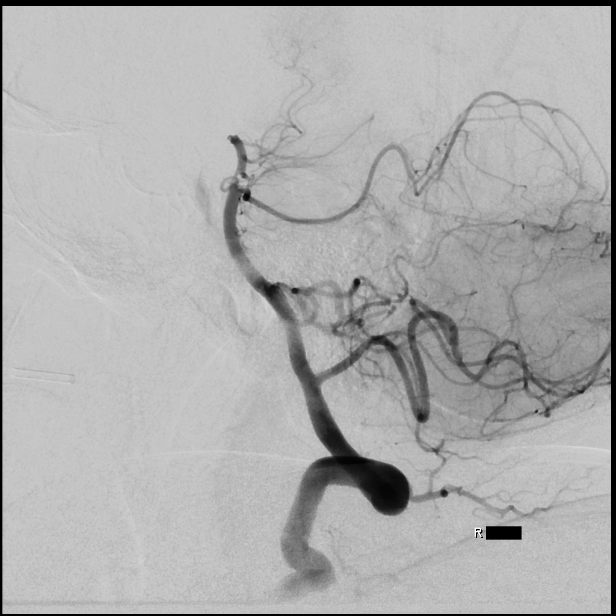
[im 71/232]
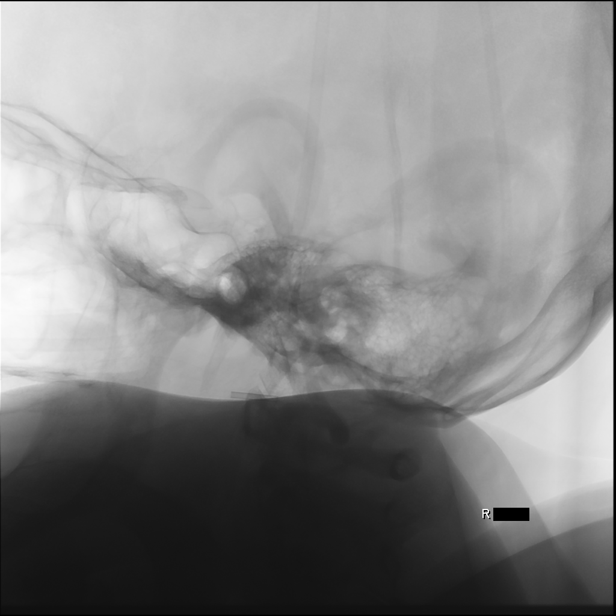
[im 91/232]
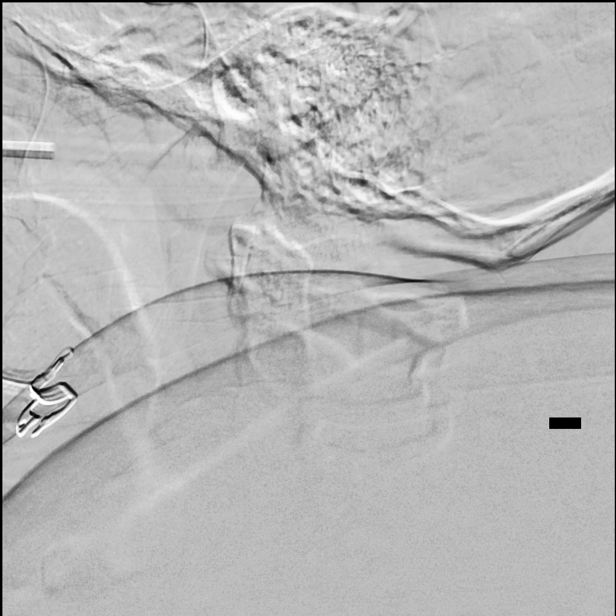
[im 111/232]
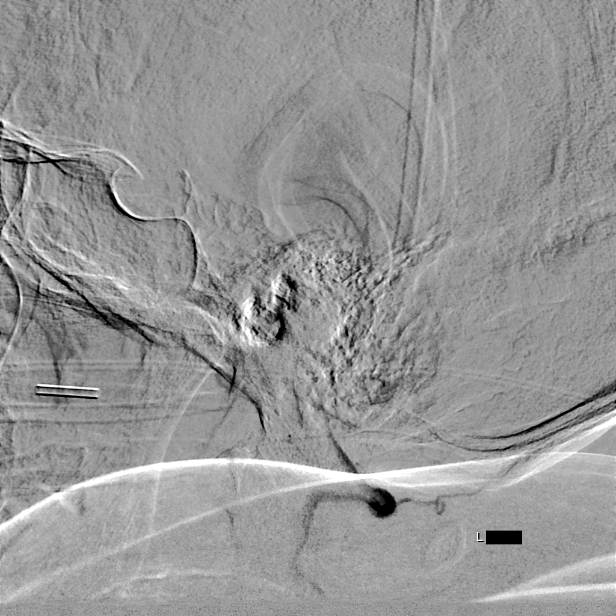
[im 131/232]
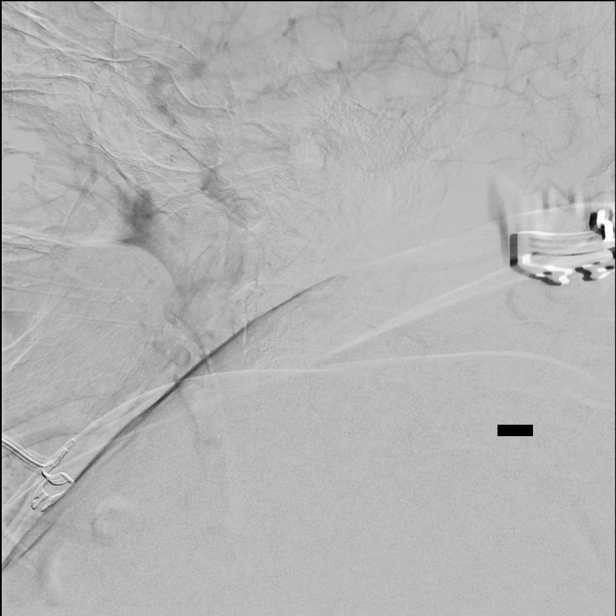
[im 151/232]
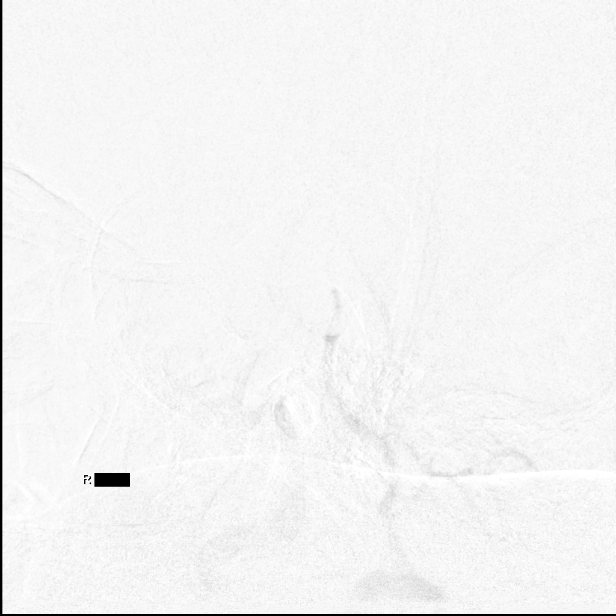
[im 171/232]
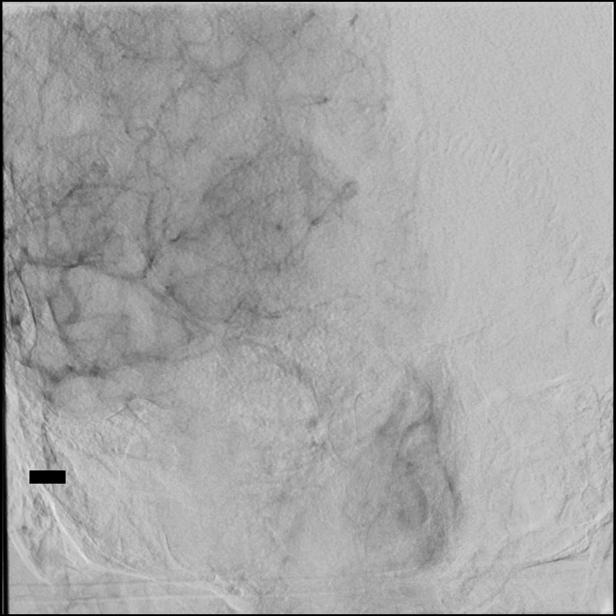
[im 191/232]
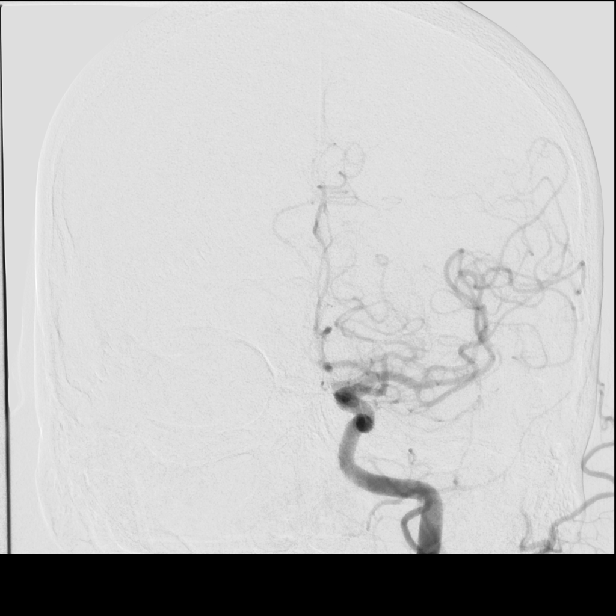
[im 211/232]
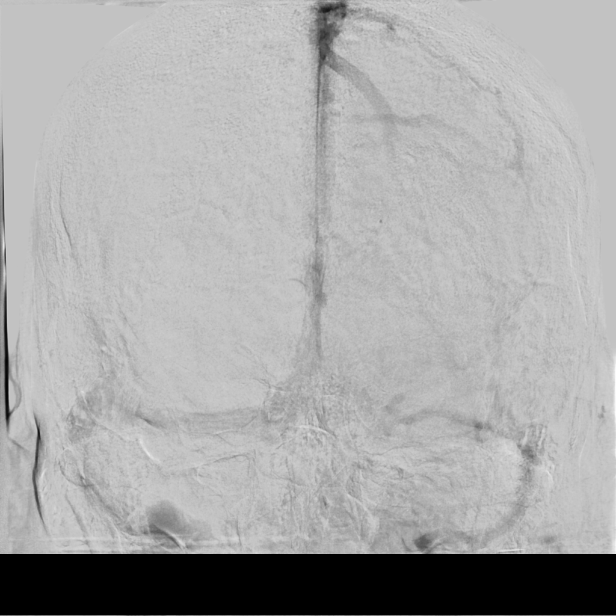
[im 232/232]
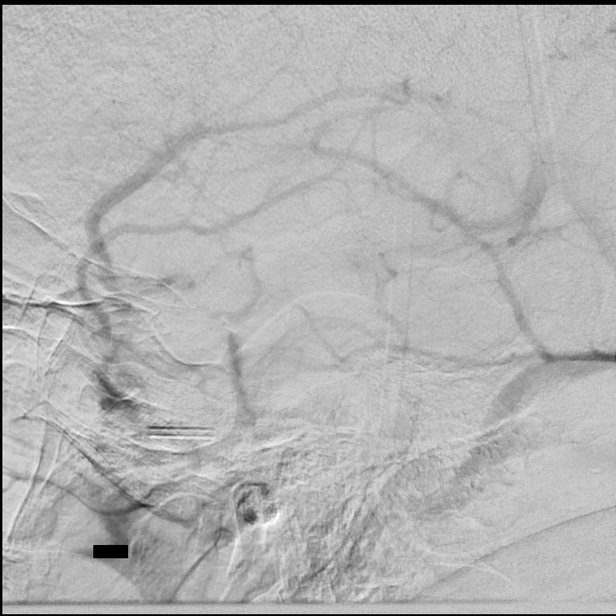

[12 of 24 positions shown; findings below may reference images not displayed]

MEDICATIONS:
Heparin 4999 units IA. No antibiotic was administered within 1 hour
of the procedure.

ANESTHESIA/SEDATION:
Versed 1 mg IV; Fentanyl 25 mcg IV

Moderate Sedation Time:  52 minutes

The patient was continuously monitored during the procedure by the
interventional radiology nurse under my direct supervision.

CONTRAST:  Isovue 300 approximately 65 mL

FLUOROSCOPY TIME:  Fluoroscopy Time: 16 minutes 36 seconds (3936
mGy).

COMPLICATIONS:
None immediate.
The right the right forearm to the wrist was prepped and draped in
the usual sterile manner. The right radial artery was then
identified with ultrasound, and its morphology documented. A dorsal
palmar anastomosis was verified to be present. Using ultrasound
guidance access into the right radial artery was obtained with a
micropuncture set over a 0.018 inch micro guidewire.

Over the micro guidewire, a [DATE] French radial sheath was then
inserted. The micro guidewire, and the obturator were removed. Good
aspiration was obtained from the side port of the sheath. A cocktail
of 4999 units of heparin, 2.5 mg of verapamil, and 200 mcg of
nitroglycerin was infused in diluted form through the radial sheath
without event. A right radial arteriogram was then obtained. Over a
0.035 inch Roadrunner guidewire, a 5 Kevia Edouard 2 diagnostic
catheter was advanced to the aortic arch region and selectively
positioned in the right vertebral artery, the left subclavian
artery, the left common carotid artery and the right common carotid
artery.

Following the procedure, hemostasis at the right radial puncture
site was obtained with a wrist band. Distal right radial pulse was
verified to be present.
FINDINGS: The right vertebral artery origin is widely patent.

The vessel is seen to opacify to the cranial skull base. Patency is
maintained of the right posteroinferior cerebellar artery and the
right vertebrobasilar junction.

The basilar artery to the level of the superior cerebellar artery
demonstrates wide patency.

Distal to this there is a severe focal stenosis. Opacification is
seen above the superior cerebellar arteries and the
anterior-inferior cerebellar arteries into the capillary and venous
phases.

Proximal opacification of the left posterior cerebral artery is
noted.

Unopacified blood is seen in the basilar artery from the
contralateral non dominant vertebral artery.

The non dominant vertebral artery origin is widely patent.

The vessel opacifies to the cranial skull base. Diminutive caliber
of the left vertebrobasilar junction is seen distal to the left
posterior-inferior cerebellar artery.

The right common carotid arteriogram demonstrates the right external
carotid artery and its major branches to be widely patent.

The right internal carotid artery at the bulb to the cranial skull
base is widely patent with moderate tortuosity in the mid [DATE].

The petrous, cavernous and supraclinoid segments are widely patent.

The right posterior communicating artery is seen opacifying the
right posterior cerebral artery distribution.

The right middle cerebral artery and the right anterior cerebral
artery opacify into the capillary and venous phases.

Moderate narrowing is seen of the superior division proximally.

The left common carotid arteriogram demonstrates the left external
carotid artery origin and its branches to be widely patent.

The left internal carotid artery at the bulb has a smooth shallow
plaque without evidence of ulcerations or of intraluminal filling
defects. The vessel is seen to opacify to the cranial skull base.
Patency is seen of the petrous, cavernous and the supraclinoid left
ICA.

A left posterior communicating artery is seen opacifying the left
posterior cerebral artery distribution.

The left middle cerebral artery and the left anterior cerebral
artery opacify into the capillary and venous phases.
IMPRESSION: Severe high-grade stenosis of the distal basilar artery just distal
to the origin of the superior cerebellar arteries.

PLAN:
Findings reviewed with the patient and the referring stroke
neurologist.

The option of endovascular revascularization of symptomatic
high-grade basilar artery stenosis was reviewed in some detail.

Procedure, reasons, and potential complications were reviewed with
the patient.

Patient has expressed her desire to pursue endovascular treatment
with angioplasty +/-stenting.

This will be scheduled as soon as possible.

## 2022-06-07 DIAGNOSIS — G4733 Obstructive sleep apnea (adult) (pediatric): Secondary | ICD-10-CM | POA: Diagnosis not present

## 2022-06-15 ENCOUNTER — Ambulatory Visit (INDEPENDENT_AMBULATORY_CARE_PROVIDER_SITE_OTHER): Payer: 59

## 2022-06-15 DIAGNOSIS — G459 Transient cerebral ischemic attack, unspecified: Secondary | ICD-10-CM

## 2022-06-16 ENCOUNTER — Other Ambulatory Visit: Payer: Self-pay | Admitting: Cardiovascular Disease

## 2022-06-16 ENCOUNTER — Other Ambulatory Visit: Payer: Self-pay | Admitting: Family Medicine

## 2022-06-16 LAB — CUP PACEART REMOTE DEVICE CHECK
Date Time Interrogation Session: 20240115231321
Implantable Pulse Generator Implant Date: 20211102

## 2022-06-17 NOTE — Telephone Encounter (Signed)
Name of Medication: Lorazepam, Tramadol Name of Pharmacy: [facility] [place] or Written Date and Quantity:       Lorazepam- 10/81/23, #30      Tramadol- 05/19/22, #20 Last Office Visit and Type: 04/10/22, 6 mo DM f/u Next Office Visit and Type: 07/13/22, 3 mo f/u Last Controlled Substance Agreement Date: none Last UDS: none  Gabapentin last filled: 03/15/22, #450.

## 2022-06-17 NOTE — Telephone Encounter (Signed)
ERx 

## 2022-07-08 DIAGNOSIS — G4733 Obstructive sleep apnea (adult) (pediatric): Secondary | ICD-10-CM | POA: Diagnosis not present

## 2022-07-13 ENCOUNTER — Encounter: Payer: Self-pay | Admitting: Family Medicine

## 2022-07-13 ENCOUNTER — Ambulatory Visit (INDEPENDENT_AMBULATORY_CARE_PROVIDER_SITE_OTHER): Payer: 59 | Admitting: Family Medicine

## 2022-07-13 VITALS — BP 126/78 | HR 76 | Temp 97.4°F | Ht 60.0 in | Wt 253.5 lb

## 2022-07-13 DIAGNOSIS — E1169 Type 2 diabetes mellitus with other specified complication: Secondary | ICD-10-CM

## 2022-07-13 DIAGNOSIS — I2781 Cor pulmonale (chronic): Secondary | ICD-10-CM

## 2022-07-13 DIAGNOSIS — J449 Chronic obstructive pulmonary disease, unspecified: Secondary | ICD-10-CM | POA: Diagnosis not present

## 2022-07-13 DIAGNOSIS — I5042 Chronic combined systolic (congestive) and diastolic (congestive) heart failure: Secondary | ICD-10-CM | POA: Diagnosis not present

## 2022-07-13 DIAGNOSIS — J9611 Chronic respiratory failure with hypoxia: Secondary | ICD-10-CM | POA: Diagnosis not present

## 2022-07-13 DIAGNOSIS — F39 Unspecified mood [affective] disorder: Secondary | ICD-10-CM

## 2022-07-13 LAB — POCT GLYCOSYLATED HEMOGLOBIN (HGB A1C): Hemoglobin A1C: 6 % — AB (ref 4.0–5.6)

## 2022-07-13 NOTE — Patient Instructions (Addendum)
You are doing well - sugars are well controlled. Continue current medicines. Schedule appointment with Dr Melvyn Novas Schedule physical with me in 3 months after 10/21/2022.

## 2022-07-13 NOTE — Progress Notes (Signed)
Patient ID: Brenda [name], female    DOB: March 15, 1961, 62 y.o.   MRN: [id number]  This visit was conducted in person.  BP 126/78   Pulse 76   Temp (!) 97.4 F (36.3 C) (Temporal)   Ht 5' (1.524 m)   Wt 253 lb 8 oz (115 kg)   LMP 03/08/2013   SpO2 94% Comment: 2 L  BMI 49.51 kg/m    CC: 3 mo DM f/u visit  Subjective:   HPI: Brenda [name] is a 62 y.o. female presenting on 07/13/2022 for Medical Management of Chronic Issues (Here for 3 mo DM f/u.)   COPD - continues breztri 2 puffs BID. Due for f/u with Dr Melvyn Novas. She uses albuterol inhaler about once a day, has not recently needed nebulizer. She continues 2L O2 via [place]. Breathing limits activity.   She takes lorazepam nightly to help her sleep. She also continues lexapro 35m daily.   DM - does not regularly check sugars - 114 last week. Compliant with antihyperglycemic regimen which includes: trulicity [id number], jardiance 164mdaily. Tolerating well without nausea, diarrhea, constipation. Denies low sugars or hypoglycemic symptoms. Denies blurry vision. Notes foot paresthesias bilaterally, chronic. Last diabetic eye exam 05/2022 (HHerbert Deaner Glucometer brand: Relion. Last foot exam: 04/2022. DSME: declined.  Lab Results  Component Value Date   HGBA1C 6.0 (A) 07/13/2022   Diabetic Foot Exam - Simple   No data filed    Lab Results  Component Value Date   MICROALBUR <0.7 04/03/2022         Relevant past medical, surgical, family and social history reviewed and updated as indicated. Interim medical history since our last visit reviewed. Allergies and medications reviewed and updated. Outpatient Medications Prior to Visit  Medication Sig Dispense Refill   albuterol (PROVENTIL) (2.5 MG/3ML) 0.083% nebulizer solution INHALE 1 VIAL VIA NEBULIZER EVERY 6 HOURS AS NEEDED FOR WHEEZING/SHORTNESS OF BREATH 150 mL 1   ARIPiprazole (ABILIFY) 10 MG tablet Take 1 tablet (10 mg total) by mouth daily. 90 tablet 3   aspirin EC 81 MG EC  tablet Take 1 tablet (81 mg total) by mouth daily. Swallow whole. 30 tablet 0   atorvastatin (LIPITOR) 80 MG tablet TAKE 1 TABLET BY MOUTH EVERYDAY AT BEDTIME 90 tablet 1   Budeson-Glycopyrrol-Formoterol (BREZTRI AEROSPHERE) 160-9-4.8 MCG/ACT AERO INHALE 2 PUFFS INTO LUNGS 2 TIMES DAILY 5.9 g 11   Cholecalciferol (VITAMIN D3) 25 MCG (1000 UT) CAPS Take 1 capsule (1,000 Units total) by mouth daily. 30 capsule    dextromethorphan-guaiFENesin (MUCINEX DM) 30-600 MG 12hr tablet Take 1 tablet by mouth 2 (two) times daily as needed for cough.     Dulaglutide (TRULICITY) 0.[id number] Inject 0.75 mg into the skin once a week. 2 mL 6   ENTRESTO 24-26 MG TAKE 1 TABLET BY MOUTH TWICE A DAY 180 tablet 1   escitalopram (LEXAPRO) 20 MG tablet Take 1 tablet (20 mg total) by mouth daily. 90 tablet 1   fluticasone (FLONASE) 50 MCG/ACT nasal spray PLACE 1 SPRAY INTO BOTH NOSTRILS 2 (TWO) TIMES DAILY 48 mL 3   furosemide (LASIX) 20 MG tablet TAKE 1 TABLET BY MOUTH EVERY DAY 90 tablet 3   gabapentin (NEURONTIN) 300 MG capsule TAKE 2 CAPSULES IN THE MORNING AND 3 CAPSULES AT NIGHT 450 capsule 0   JARDIANCE 10 MG TABS tablet TAKE 1 TABLET BY MOUTH EVERY DAY BEFORE BREAKFAST 90 tablet 3   LORazepam (ATIVAN) 0.5 MG tablet TAKE 1/2 TO 1  TABLET BY MOUTH TWICE A DAY AS NEEDED FOR ANXIETY 30 tablet 0   metoprolol succinate (TOPROL-XL) 25 MG 24 hr tablet TAKE 1 TABLET (25 MG TOTAL) BY MOUTH DAILY. 90 tablet 1   Multiple Vitamin (MULTIVITAMIN WITH MINERALS) TABS tablet Take 1 tablet by mouth daily.     nitroGLYCERIN (NITROSTAT) 0.4 MG SL tablet Place 1 tablet (0.4 mg total) under the tongue every 5 (five) minutes as needed. 25 tablet 2   nystatin (MYCOSTATIN) 100000 UNIT/ML suspension TAKE 1 TEASPOONFUL BY MOUTH 3 TIMES DAILY AS NEEDED FOR MOUTH SORES. 150 mL 1   PROAIR RESPICLICK 123XX123 (90 Base) MCG/ACT AEPB INHALE 2 PUFFS INTO THE LUNGS EVERY 6 HOURS AS NEEDED FOR DYSPNEA/WHEEZE 1 each 1   spironolactone (ALDACTONE) 25 MG  tablet TAKE 1/2 TABLET BY MOUTH EVERY DAY 45 tablet 3   traMADol (ULTRAM) 50 MG tablet TAKE 1 TABLET BY MOUTH EVERY 8 HOURS AS NEEDED FOR UP TO 5 DAYS FOR MODERATE PAIN 20 tablet 0   benzonatate (TESSALON) 200 MG capsule Take 1 capsule (200 mg total) by mouth 3 (three) times daily as needed for cough. Swallow whole, to not bite pill 45 capsule 2   predniSONE (DELTASONE) 10 MG tablet Take 2 a day for 7 days, then 1 a day for 7 days, with food. Don't take with aleve/ibuprofen. 21 tablet 0   No facility-administered medications prior to visit.     Per HPI unless specifically indicated in ROS section below Review of Systems  Objective:  BP 126/78   Pulse 76   Temp (!) 97.4 F (36.3 C) (Temporal)   Ht 5' (1.524 m)   Wt 253 lb 8 oz (115 kg)   LMP 03/08/2013   SpO2 94% Comment: 2 L  BMI 49.51 kg/m   Wt Readings from Last 3 Encounters:  07/13/22 253 lb 8 oz (115 kg)  04/10/22 252 lb 3.2 oz (114.4 kg)  01/30/22 242 lb (109.8 kg)      Physical Exam Vitals and nursing note reviewed.  Constitutional:      Appearance: Normal appearance. She is not ill-appearing.  HENT:     Mouth/Throat:     Mouth: Mucous membranes are moist.     Pharynx: Oropharynx is clear. No oropharyngeal exudate or posterior oropharyngeal erythema.     Comments: Fever blister to right upper lip Cardiovascular:     Rate and Rhythm: Normal rate and regular rhythm.     Pulses: Normal pulses.     Heart sounds: Normal heart sounds. No murmur heard. Pulmonary:     Effort: Pulmonary effort is normal. No respiratory distress.     Breath sounds: Wheezing (faint diffusely bilaterally) present. No rhonchi or rales.  Musculoskeletal:     Right lower leg: No edema.     Left lower leg: No edema.  Skin:    General: Skin is warm and dry.     Findings: No rash.  Neurological:     Mental Status: She is alert.  Psychiatric:        Mood and Affect: Mood normal.        Behavior: Behavior normal.       Results for orders  placed or performed in visit on 07/13/22  POCT glycosylated hemoglobin (Hb A1C)  Result Value Ref Range   Hemoglobin A1C 6.0 (A) 4.0 - 5.6 %   HbA1c POC (<> result, manual entry)     HbA1c, POC (prediabetic range)     HbA1c, POC (controlled diabetic range)  07/13/2022   12:24 PM 10/15/2021   12:03 PM 10/08/2021   11:55 AM 10/01/2020   12:15 PM 02/23/2020   12:24 PM  Depression screen PHQ 2/9  Decreased Interest 0 1 1 0 3  Down, Depressed, Hopeless 0 0 0 0 3  PHQ - 2 Score 0 1 1 0 6  Altered sleeping 1 0 1 0 2  Tired, decreased energy 1 0 1 1 3  $ Change in appetite 1 0 0 0 1  Feeling bad or failure about yourself  0 0 0 0 3  Trouble concentrating 0 0 0 0 2  Moving slowly or fidgety/restless 0 0 2 0 3  Suicidal thoughts 0 0 0 0 0  PHQ-9 Score 3 1 5 1 20  $ Difficult doing work/chores Not difficult at all Somewhat difficult Not difficult at all         07/13/2022   12:25 PM 10/08/2021   11:55 AM 10/01/2020   12:16 PM 02/23/2020   12:25 PM  GAD 7 : Generalized Anxiety Score  Nervous, Anxious, on Edge 1 3 1 3  $ Control/stop worrying 1 0 0 3  Worry too much - different things 1 0 0 3  Trouble relaxing 0 1 0 3  Restless 1 3 0 1  Easily annoyed or irritable 0 0 0 3  Afraid - awful might happen 0 1 0 2  Total GAD 7 Score 4 8 1 18  $ Anxiety Difficulty Not difficult at all Not difficult at all     Assessment & Plan:   Problem List Items Addressed This Visit     Obesity, morbid, BMI 40.0-49.9 (HCC)    Weight remains elevated despite starting trulicity [id number] weekly. With good diabetes control on current regimen, hesitant to increase dose given prior history of pancreatitis.       Mood disorder (HCC)    Stable period on lexapro 55m daily with lorazepam 0.581mnightly PRN sleep - discussed preferable sparing benzo use       Type 2 diabetes mellitus ([place]- Primary    Chronic, stable on current regimen of jardiance 1099991111aily and trulicity 0.XX123456eekly. Caution with GLP1RA dose  titration in h/o pancreatitis.       Relevant Orders   POCT glycosylated hemoglobin (Hb A1C) (Completed)   Chronic respiratory failure with hypoxia (HCC)   Cor pulmonale (chronic) (HCC)   COPD (chronic obstructive pulmonary disease) (HCC)    Chronic, severe oxygen dependent, overall stable period.  She continues breztri 2 puffs BID.  Overdue for pulm f/u - advised call to schedule appt       Chronic combined systolic and diastolic CHF (congestive heart failure) (HCC)    Seems euvolemic. Continues lasix 206maily and spironolactone 51m85m2 tab daily. If leg swelling or weight gain, she increases spironolactone to 51mg74mab daily.         No orders of the defined types were placed in this encounter.   Orders Placed This Encounter  Procedures   POCT glycosylated hemoglobin (Hb A1C)    Patient Instructions  You are doing well - sugars are well controlled. Continue current medicines. Schedule appointment with Dr Wert Melvyn Novasdule physical with me in 3 months after 10/21/2022.   Follow up plan: Return in about 3 months (around 10/21/2022) for annual exam, prior fasting for blood work.  JavieRia Bush

## 2022-07-13 NOTE — Assessment & Plan Note (Signed)
Seems euvolemic. Continues lasix 73m daily and spironolactone 238m1/2 tab daily. If leg swelling or weight gain, she increases spironolactone to 2563m tab daily.

## 2022-07-13 NOTE — Assessment & Plan Note (Signed)
Stable period on lexapro 79m daily with lorazepam 0.572mnightly PRN sleep - discussed preferable sparing benzo use

## 2022-07-13 NOTE — Assessment & Plan Note (Signed)
Weight remains elevated despite starting trulicity XX123456 weekly. With good diabetes control on current regimen, hesitant to increase dose given prior history of pancreatitis.

## 2022-07-13 NOTE — Assessment & Plan Note (Signed)
Chronic, severe oxygen dependent, overall stable period.  She continues breztri 2 puffs BID.  Overdue for pulm f/u - advised call to schedule appt

## 2022-07-13 NOTE — Assessment & Plan Note (Signed)
Chronic, stable on current regimen of jardiance [id number] daily and trulicity XX123456 weekly. Caution with GLP1RA dose titration in h/o pancreatitis.

## 2022-07-15 ENCOUNTER — Other Ambulatory Visit: Payer: Self-pay | Admitting: Family Medicine

## 2022-07-16 NOTE — Telephone Encounter (Signed)
Name of Medication: Lorazepam, Tramadol Name of Pharmacy: [place] or Written Date and Quantity:       Lorazepam- 06/17/22, #30      Tramadol- 06/17/22, #20 Last Office Visit and Type: 07/13/22, 3 mo DM f/u Next Office Visit and Type: 10/27/22, CPE Last Controlled Substance Agreement Date: none Last UDS: none

## 2022-07-20 ENCOUNTER — Ambulatory Visit (INDEPENDENT_AMBULATORY_CARE_PROVIDER_SITE_OTHER): Payer: 59

## 2022-07-20 DIAGNOSIS — G459 Transient cerebral ischemic attack, unspecified: Secondary | ICD-10-CM | POA: Diagnosis not present

## 2022-07-21 LAB — CUP PACEART REMOTE DEVICE CHECK
Date Time Interrogation Session: 20240218231109
Implantable Pulse Generator Implant Date: 20211102

## 2022-07-25 ENCOUNTER — Other Ambulatory Visit: Payer: Self-pay | Admitting: Family Medicine

## 2022-07-27 NOTE — Telephone Encounter (Signed)
Refill request Abilify Last refill 10/08/21 #90/1 Last office 07/13/22

## 2022-07-28 NOTE — Telephone Encounter (Signed)
ERx 

## 2022-07-29 ENCOUNTER — Telehealth: Payer: Self-pay

## 2022-07-29 NOTE — Progress Notes (Signed)
Care Management & Coordination Services Pharmacy Team  Reason for Encounter: Appointment Reminder  Contacted patient to confirm telephone appointment with Charlene Brooke, PharmD on 08/03/2022 at 11:00.  Spoke with patient on 07/29/2022   Do you have any problems getting your medications? No  What is your top health concern you would like to discuss at your upcoming visit? No issues at this time  Have you seen any other providers since your last visit with PCP? Yes  Star Rating Drugs:  Medication:  Last Fill: Day Supply Atorvastatin 80 mg 07/26/22 90 Jardiance 10 mg 06/17/22 90  Care Gaps: Annual wellness visit in last year? Yes 10/15/2021  If Diabetic: Last eye exam / retinopathy screening: Up to date Last diabetic foot exam: Up to date  Charlene Brooke, PharmD notified  Marijean Niemann, [place] Assistant [phone]

## 2022-08-03 ENCOUNTER — Ambulatory Visit: Payer: 59 | Admitting: Pharmacist

## 2022-08-03 NOTE — Progress Notes (Signed)
Care Management & Coordination Services Pharmacy Note  08/03/2022 Name:  Brenda [name] MRN:  [id number] DOB:  08/18/1960  Summary: F/U visit -HF: pt reports BP 115-120s/60s; she denies excess swelling; she will occasionally taking full tablet of spironolactone when she has extra swelling, denies dizziness/hypotension with this -DM: A1c 6.0% (07/2022); pt has not lost weight since starting Trulicity but BG control has improved; caution increasing Trulicity with remote h/o pancreatitis -COPD: pt endorses compliance with Judithann Sauger, she wears O2 most of the time; she denies SOB or wheezing; she has quit smoking  Recommendations/Changes made from today's visit: -No med changes -Health maintenance: reviewed overdue mammogram, FOBT. Pt was advised to schedule mammogram, and pt wants to discuss colonoscopy at CPE  Follow up plan: -[place] will call patient 6 months for HF update -Pharmacist follow up PRN -PCP appt 10/27/22 (CPE)    Subjective: Brenda [name] is an 62 y.o. year old female who is a primary patient of Ria Bush, MD.  The care coordination team was consulted for assistance with disease management and care coordination needs.    Engaged with patient by telephone for follow up visit.  Recent office visits: 07/13/22 Dr Danise Mina OV: A1c 6.0%; caution GLP-1 dose titration in h/o pancreatitis. Overdue for pulm f/u.  04/10/22 Dr Danise Mina OV: f/u - D/C metformin. Wt gain since quitting smoking. Start Trulicity [id number] mg.  [id number] Dr Damita Dunnings OV: L sciatica - rx hydrocodone, prednisone. Updated abilify to 10 mg.   Recent consult visits: 11/13/21 Dr Marisue Ivan (Cardiology): f/u CAD - D/C Brilinta (1 year DAPT completed)  Hospital visits: None in previous 6 months   Objective:  Lab Results  Component Value Date   CREATININE 1.09 04/03/2022   BUN 17 04/03/2022   GFR 53.98 (L) 04/03/2022   GFRNONAA >60 11/03/2020   GFRAA 65 06/10/2020   NA 140 04/03/2022   K 4.6 04/03/2022    CALCIUM 9.7 04/03/2022   CO2 32 04/03/2022   GLUCOSE 127 (H) 04/03/2022    Lab Results  Component Value Date/Time   HGBA1C 6.0 (A) 07/13/2022 12:30 PM   HGBA1C 7.0 (H) 04/03/2022 07:47 AM   HGBA1C 6.5 10/03/2021 08:55 AM   GFR 53.98 (L) 04/03/2022 07:47 AM   GFR 60.81 10/03/2021 08:55 AM   MICROALBUR <0.7 04/03/2022 07:47 AM   MICROALBUR <0.7 07/31/2019 08:08 AM    Last diabetic Eye exam:  Lab Results  Component Value Date/Time   HMDIABEYEEXA No Retinopathy 05/18/2022 12:00 AM    Last diabetic Foot exam: No results found for: "HMDIABFOOTEX"   Lab Results  Component Value Date   CHOL 112 04/03/2022   HDL 42.90 04/03/2022   LDLCALC 49 04/03/2022   TRIG 104.0 04/03/2022   CHOLHDL 3 04/03/2022       Latest Ref Rng & Units 04/03/2022    7:47 AM 10/03/2021    8:55 AM 11/02/2020    2:23 PM  Hepatic Function  Total Protein 6.0 - 8.3 g/dL 7.1  7.4  7.8   Albumin 3.5 - 5.2 g/dL 4.1  3.9  3.6   AST 0 - 37 U/L '11  15  18   '$ ALT 0 - 35 U/L '13  20  15   '$ Alk Phosphatase 39 - 117 U/L 119  111  101   Total Bilirubin 0.2 - 1.2 mg/dL 0.4  0.3  0.6     Lab Results  Component Value Date/Time   TSH 0.821 02/08/2020 08:31 AM   TSH 2.27 07/24/2016 04:54 PM  TSH 2.93 09/11/2014 08:47 AM       Latest Ref Rng & Units 04/03/2022    7:47 AM 10/03/2021    8:55 AM 11/03/2020    7:19 AM  CBC  WBC 4.0 - 10.5 K/uL 12.9  12.7  14.1   Hemoglobin 12.0 - 15.0 g/dL 15.1  13.9  13.3   Hematocrit 36.0 - 46.0 % 46.0  43.5  41.6   Platelets 150.0 - 400.0 K/uL 259.0  309.0  312     Lab Results  Component Value Date/Time   VD25OH 30.95 04/03/2022 07:47 AM   VD25OH 30.62 10/03/2021 08:55 AM   VITAMINB12 1,186 (H) 07/24/2016 04:54 PM    Clinical ASCVD: Yes  The ASCVD Risk score (Arnett DK, et al., 2019) failed to calculate for the following reasons:   The patient has a prior MI or stroke diagnosis        07/13/2022   12:24 PM 10/15/2021   12:03 PM 10/08/2021   11:55 AM  Depression screen PHQ  2/9  Decreased Interest 0 1 1  Down, Depressed, Hopeless 0 0 0  PHQ - 2 Score 0 1 1  Altered sleeping 1 0 1  Tired, decreased energy 1 0 1  Change in appetite 1 0 0  Feeling bad or failure about yourself  0 0 0  Trouble concentrating 0 0 0  Moving slowly or fidgety/restless 0 0 2  Suicidal thoughts 0 0 0  PHQ-9 Score '3 1 5  '$ Difficult doing work/chores Not difficult at all Somewhat difficult Not difficult at all       07/13/2022   12:25 PM 10/08/2021   11:55 AM 10/01/2020   12:16 PM 02/23/2020   12:25 PM  GAD 7 : Generalized Anxiety Score  Nervous, Anxious, on Edge '1 3 1 3  '$ Control/stop worrying 1 0 0 3  Worry too much - different things 1 0 0 3  Trouble relaxing 0 1 0 3  Restless 1 3 0 1  Easily annoyed or irritable 0 0 0 3  Afraid - awful might happen 0 1 0 2  Total GAD 7 Score '4 8 1 18  '$ Anxiety Difficulty Not difficult at all Not difficult at all       Social History   Tobacco Use  Smoking Status Former   Packs/day: 0.25   Years: 41.00   Total pack years: 10.25   Types: Cigarettes   Quit date: 10/01/2021   Years since quitting: 0.8   Passive exposure: Current  Smokeless Tobacco Never   BP Readings from Last 3 Encounters:  07/13/22 126/78  04/10/22 130/64  01/30/22 124/80   Pulse Readings from Last 3 Encounters:  07/13/22 76  04/10/22 73  01/30/22 72   Wt Readings from Last 3 Encounters:  07/13/22 253 lb 8 oz (115 kg)  04/10/22 252 lb 3.2 oz (114.4 kg)  01/30/22 242 lb (109.8 kg)   BMI Readings from Last 3 Encounters:  07/13/22 49.51 kg/m  04/10/22 49.25 kg/m  01/30/22 47.26 kg/m    Allergies  Allergen Reactions   Metolazone Other (See Comments)    Metabolic mood swings    Medications Reviewed Today     Reviewed by Charlton Haws, RPH (Pharmacist) on 08/03/22 at 52  Med List Status: <None>   Medication Order Taking? Sig Documenting Provider Last Dose Status Informant  albuterol (PROVENTIL) (2.5 MG/3ML) 0.083% nebulizer solution  [id number] Yes INHALE 1 VIAL VIA NEBULIZER EVERY 6 HOURS AS NEEDED FOR WHEEZING/SHORTNESS OF  Alvester Morin, MD Taking Active   ARIPiprazole (ABILIFY) 10 MG tablet [id number] Yes TAKE 1 TABLET BY MOUTH EVERY DAY Ria Bush, MD Taking Active   aspirin EC 81 MG EC tablet [id number] Yes Take 1 tablet (81 mg total) by mouth daily. Swallow whole. Mariel Aloe, MD Taking Active Self  atorvastatin (LIPITOR) 80 MG tablet [id number] Yes TAKE 1 TABLET BY MOUTH EVERYDAY AT BEDTIME Ria Bush, MD Taking Active   Budeson-Glycopyrrol-Formoterol [facility] AEROSPHERE) 160-9-4.8 MCG/ACT AERO [id number] Yes INHALE 2 PUFFS INTO LUNGS 2 TIMES DAILY Tanda Rockers, MD Taking Active   Cholecalciferol (VITAMIN D3) 25 MCG (1000 UT) CAPS [id number] Yes Take 1 capsule (1,000 Units total) by mouth daily. Ria Bush, MD Taking Active Self  dextromethorphan-guaiFENesin [facility] DM) 30-600 MG 12hr tablet [id number] Yes Take 1 tablet by mouth 2 (two) times daily as needed for cough. [provider] Taking Active Self  Dulaglutide (TRULICITY) A999333 0000000 SOPN [id number] Yes Inject 0.75 mg into the skin once a week. Ria Bush, MD Taking Active   [place] 24-26 [place] [id number] Yes TAKE 1 TABLET BY MOUTH TWICE A DAY O'Neal, Cassie Freer, MD Taking Active   escitalopram (LEXAPRO) 20 MG tablet [id number] Yes Take 1 tablet (20 mg total) by mouth daily. Ria Bush, MD Taking Active   fluticasone [facility]) 50 MCG/ACT nasal spray [id number] Yes PLACE 1 SPRAY INTO BOTH NOSTRILS 2 (TWO) TIMES DAILY Ria Bush, MD Taking Active   furosemide (LASIX) 20 MG tablet [id number] Yes TAKE 1 TABLET BY MOUTH EVERY DAY O'Neal, Cassie Freer, MD Taking Active   gabapentin (NEURONTIN) 300 MG capsule [id number] Yes TAKE 2 CAPSULES IN THE MORNING AND 3 CAPSULES AT Georga Hacking, MD Taking Active   JARDIANCE 10 MG TABS tablet [id number] Yes TAKE 1 TABLET BY MOUTH EVERY DAY BEFORE BREAKFAST O'Neal,  Cassie Freer, MD Taking Active   LORazepam (ATIVAN) 0.5 MG tablet [id number] Yes TAKE 1/2 TO 1 TABLET BY MOUTH TWICE A DAY AS NEEDED FOR ANXIETY Ria Bush, MD Taking Active   metoprolol succinate (TOPROL-XL) 25 MG 24 hr tablet [id number] Yes TAKE 1 TABLET (25 MG TOTAL) BY MOUTH DAILY. Geralynn Rile, MD Taking Active   Multiple Vitamin (MULTIVITAMIN WITH MINERALS) TABS tablet [id number] Yes Take 1 tablet by mouth daily. [provider] Taking Active Self  nitroGLYCERIN (NITROSTAT) 0.4 MG SL tablet [id number] Yes Place 1 tablet (0.4 mg total) under the tongue every 5 (five) minutes as needed. Cheryln Manly, NP Taking Active            Med Note Marjorie Smolder Nov 13, 2021 10:19 AM) Patient has if needed  nystatin (MYCOSTATIN) 100000 UNIT/ML suspension [id number] Yes TAKE 1 TEASPOONFUL BY MOUTH 3 TIMES DAILY AS NEEDED FOR MOUTH SORES. Melvenia Needles, NP Taking Active   PROAIR [place] [id number] (90 Base) MCG/ACT AEPB [id number] Yes INHALE 2 PUFFS INTO THE LUNGS EVERY 6 HOURS AS NEEDED FOR DYSPNEA/WHEEZE Ria Bush, MD Taking Active   spironolactone (ALDACTONE) 25 MG tablet [id number] Yes TAKE 1/2 TABLET BY MOUTH EVERY DAY O'Neal, Cassie Freer, MD Taking Active   traMADol (ULTRAM) 50 MG tablet [id number] Yes TAKE 1 TABLET BY MOUTH EVERY 8 HOURS AS NEEDED FOR UP TO 5 DAYS FOR MODERATE PAIN Ria Bush, MD Taking Active             SDOH:  (Social Determinants of Health) assessments and interventions performed: No SDOH Interventions    Flowsheet Row Telephone from 02/06/2022 in [facility]  [place] Coordination Clinical Support from 10/15/2021 in [place] at  Management from 02/07/2021 in [place] [facility] [place] Visit from 07/27/2018 in [place] at [place] Interventions Intervention Not Indicated  Intervention Not Indicated -- --  Housing Interventions -- Intervention Not Indicated -- --  Transportation Interventions Intervention Not Indicated Intervention Not Indicated -- --  Depression Interventions/Treatment  -- -- -- Medication, Currently on Treatment  Financial Strain Interventions -- Intervention Not Indicated Intervention Not Indicated --  Physical Activity Interventions -- Intervention Not Indicated -- --  Stress Interventions -- Other (Comment)  [Offered counseling services. Pt declined at this time.] -- --  Social Connections Interventions -- Other (Comment)  [Pt states she sees family daily.] -- --       Medication Assistance: None required.  Patient affirms current coverage meets needs. - Dual complete  Medication Access: Within the past 30 days, how often has patient missed a dose of medication? 0 Is a pillbox or other method used to improve adherence? Yes  Factors that may affect medication adherence? no barriers identified Are meds synced by current pharmacy? No  Are meds delivered by current pharmacy? No  Does patient experience delays in picking up medications due to transportation concerns? No   [facility] Services Reviewed: Is patient disadvantaged to use [facility]?: No  Current Rx insurance plan: U[employer] Dual complete [facility] and [id number] [place] [address]Phone: [phone]: [phone] [facility] services reviewed with patient today?: No  Patient requests to transfer care to [facility]?: No  Reason patient declined to change pharmacies: Not mentioned at this visit  Compliance/Adherence/Medication fill history: Care Gaps: Cologuard (due 07/2020 - ordered 09/2021 per PCP) Mammogram (due 09/2021)   Star-Rating Drugs: Atorvastatin - PDC 100% Jardiance - PDC [id number] - PDC [id number]  ASSESSMENT /  PLAN  Diabetes (A1c goal <7%) -Controlled - A1c 6.0% (07/2022) at goal; pt has gained weight since quitting smoking (239# in 09/2021 to 253# now), she has not lost any weight since starting Trulicity; she does have h/o remote pancreatitis so need to be cautious with increasing GLP-1 RA -Current home glucose readings fasting glucose: 90-115 -Current medications: Jardiance 10 mg daily -Appropriate, Effective, Safe, Accessible Trulicity [id number] weekly - Appropriate, Effective, Safe, Accessible -Educated on A1c and blood sugar goals; Complications of diabetes including kidney damage, retinal damage, and cardiovascular disease; -Recommend to continue current medication  Heart Failure / Hypertension (BP goal < 130/80, prevent exacerbations) -Controlled - BP is at goal and pt denies persistent swelling; she is on GDMT for heart failure; she reports she will taking full dose of spironolactone for a few days when she has extra swelling - she does this about once every 1-2 months -Current home BP/HR readings: 120/64, 115/70 -Last ejection fraction: 60-65% (Date: 05/2021); improved from 35-40% (04/2020) -HF type: .Marland KitchenFimpEF (EF improved from <40% to > 40%) -NYHA Class: II (slight limitation of activity) -not weighing daily basis -Current treatment: Furosemide 20 mg daily -Appropriate, Effective, Safe, Accessible Metoprolol succinate 25 mg daily -Appropriate, Effective, Safe, Accessible Entresto 24-26 mg BID -Appropriate, Effective, Safe, Accessible Spironolactone 25 mg - 1/2 tab daily- Appropriate, Effective, Safe, Accessible Jardiance 10 mg daily -Appropriate, Effective, Safe, Accessible -Educated on Benefits  of medications for managing symptoms and prolonging life; Importance of weighing daily; Importance of blood pressure control -Discussed taking higher doses of medications can lead to hypotension, advised to use caution and monitor BP/HR frequently when doing this -Recommended to continue current  medication  COPD (Goal: control symptoms and prevent exacerbations) -Controlled - pt reports overall good control; she uses Proair ~once a day to "open up lungs" before bed; she wears Oxygen most of the time -Gold Grade: Gold 2 (FEV1 50-79%) -Current COPD Classification:  B (high sx, 0-1 moderate exacerbations, no hospitalizations) -Pulmonary function testing (09/2017): FEV1 50% predicted, FEV1/FVC 0.58 -Exacerbations requiring treatment in last 6 months: 0 (last 01/2020) -Current treatment  Breztri 160-9-4.8 mcg/act 2 puffs BID -Appropriate, Effective, Safe, Accessible Proair Respiclick PRN - usually before bed -Appropriate, Effective, Safe, Accessible Albuterol 0.083% nebulizer soln PRN - very infrequently -Appropriate, Effective, Safe, Accessible Supplemental O2 -Patient reports consistent use of maintenance inhaler -Frequency of rescue inhaler use: daily or every other day -Counseled on Proper inhaler technique; Benefits of consistent maintenance inhaler use; When to use rescue inhaler -Recommended to continue current medication  Tobacco use (Goal: cessation) -At goal- quit smoking 09/2021, congratulated on continued abstinence -Previous quit attempts: has quit before, cold [place]; tried Chantix before  Depression/Anxiety (Goal: manage symptoms) -Controlled - pt reports mood is "fine" -Connected with PCP for mental health support -Current treatment: Aripiprazole 10 mg daily - Appropriate, Effective, Safe, Accessible Escitalopram 20 mg daily -Appropriate, Effective, Safe, Accessible Lorazepam 0.5 mg - 1/2 to 1 tab BID prn - once a week -Appropriate, Effective, Safe, Accessible -Educated on Benefits of medication for symptom control -Advised it is ok to use lorazepam PRN instead of cigarettes -Recommended to continue current medication  Hyperlipidemia: (LDL goal < 70) -Controlled - LDL 49 (04/2022) at goal -Hx STEMI 10/2020; hx TIA 01/2020 -Current treatment: Atorvastatin 80 mg  daily - Appropriate, Effective, Safe, Accessible Nitroglycerin 0.4 mg SL prn -Appropriate, Effective, Safe, Accessible Aspirin 81 mg daily -Appropriate, Effective, Safe, Accessible -Medications previously tried: Brilinta (completed 1 year DAPT) -Current dietary patterns: trying to cut back on portion size -Current exercise habits: trying to walk more; limited by COPD/need for O2 -Educated on Cholesterol goals; Benefits of statin for ASCVD risk reduction; -Recommended to continue current medication  Pain (Goal: manage symptoms) -Controlled - overall gabapentin helps; she reports occasionally feet burning, numbness for a few minutes; she denies sedation; uses tramadol about once a week -Hx Lumbar spinal stenosis with neurogenic claudication, lumbar radiculopathy, lumbar spondylolisthesis, adult degenerative scoliosis  -Current treatment  Gabapentin 300 mg - 2 tab AM, 3 tab PM -Appropriate, Effective, Safe, Accessible Tramadol 50 mg PRN - Appropriate, Effective, Safe, Accessible -Counseled on potential for gabapentin to cause sedation, especially with lorazepam -Recommended to continue current medication  Health Maintenance -Vaccine gaps: Shingrix -Discussed overdue mammogram and FOBT; pt was advised to schedule mammogram by PCP appt 09/2022 and pt reports she wishes to discuss colonoscopy at appt  Charlene Brooke, PharmD, BCACP Clinical Pharmacist [facility] at [facility] [phone]

## 2022-08-03 NOTE — Patient Instructions (Signed)
Visit Information  Phone number for Pharmacist: [phone]  Thank you for meeting with me to discuss your medications! Below is a summary of what we talked about during the visit:   Recommendations/Changes made from today's visit: -No med changes -Health maintenance: reviewed overdue mammogram, FOBT. Pt was advised to schedule mammogram, and pt wants to discuss colonoscopy at CPE  Follow up plan: -[place] will call patient 6 months for HF update -Pharmacist follow up PRN -PCP appt 10/27/22 (CPE)   Charlene Brooke, PharmD, BCACP Clinical Pharmacist [facility] at [facility] [phone]

## 2022-08-05 NOTE — Progress Notes (Signed)
Carelink Summary Report / Loop Recorder 

## 2022-08-06 DIAGNOSIS — G4733 Obstructive sleep apnea (adult) (pediatric): Secondary | ICD-10-CM | POA: Diagnosis not present

## 2022-08-07 DIAGNOSIS — G4733 Obstructive sleep apnea (adult) (pediatric): Secondary | ICD-10-CM | POA: Diagnosis not present

## 2022-08-08 ENCOUNTER — Other Ambulatory Visit: Payer: Self-pay | Admitting: Family Medicine

## 2022-08-10 ENCOUNTER — Telehealth: Payer: Self-pay | Admitting: Family Medicine

## 2022-08-10 ENCOUNTER — Other Ambulatory Visit: Payer: Self-pay | Admitting: Family Medicine

## 2022-08-10 DIAGNOSIS — F39 Unspecified mood [affective] disorder: Secondary | ICD-10-CM

## 2022-08-10 DIAGNOSIS — M1712 Unilateral primary osteoarthritis, left knee: Secondary | ICD-10-CM

## 2022-08-10 MED ORDER — ALBUTEROL SULFATE HFA 108 (90 BASE) MCG/ACT IN AERS: 2.0000 | INHALATION_SPRAY | Freq: Four times a day (QID) | RESPIRATORY_TRACT | 3 refills | Status: DC | PRN

## 2022-08-10 NOTE — Telephone Encounter (Signed)
Message from pharmacy:  Alternative Requested:NO LONGER COVERED PLEASE ADVISE.   Spoke with pt relaying pharmacy message. States she received the notification also. I suggested pt contact ins co to ask what med do they cover in the same family of the ProAir Respiclick inhaler. Pt verbalizes understanding and will let us know what she finds out.

## 2022-08-10 NOTE — Telephone Encounter (Signed)
Pt called requesting a call back from Lattie Haw to discuss [place] [id number] (27 Base) MCG/ACT AEPB. Call back # [phone]

## 2022-08-10 NOTE — Telephone Encounter (Signed)
Spoke to patient by telephone and was advised that her insurance company is no longer going to cover [employer]. Patient stated that she spoke with her insurance company and was advised that they will cover generic Albuterol or Ventolin. Patient stated that she has used both of these in the past without any problems. Patient stated that she was told by her insurance company if Dr. Danise Mina wants her to stay on the Proair he will need to do a PA. Patient stated if Dr. Danise Mina is okay with one of the substitutes he can send it to the pharmacy. [facility]

## 2022-08-10 NOTE — Telephone Encounter (Signed)
Plz notify I've sent in new albuterol Rx - ventolin

## 2022-08-10 NOTE — Telephone Encounter (Signed)
See 08/10/22 phn note

## 2022-08-10 NOTE — Addendum Note (Signed)
Addended by: Ria Bush on: 08/10/2022 05:12 PM   Modules accepted: Orders

## 2022-08-10 NOTE — Telephone Encounter (Signed)
Replied via other phone note.

## 2022-08-11 NOTE — Telephone Encounter (Signed)
Name of Medication: Lorazepam, Tramadol Name of Pharmacy: [facility] or Written Date and Quantity:       Lorazepam- 07/16/22, #30      Tramadol- 07/16/22, #20 Last Office Visit and Type: 07/13/22, 3 mo DM f/u Next Office Visit and Type: 10/27/22, CPE Last Controlled Substance Agreement Date: none Last UDS: none

## 2022-08-11 NOTE — Telephone Encounter (Signed)
Patient notified as instructed by telephone and verbalized understanding. 

## 2022-08-12 NOTE — Telephone Encounter (Signed)
ERx 

## 2022-08-22 ENCOUNTER — Other Ambulatory Visit: Payer: Self-pay | Admitting: Family Medicine

## 2022-08-24 ENCOUNTER — Ambulatory Visit (INDEPENDENT_AMBULATORY_CARE_PROVIDER_SITE_OTHER): Payer: 59

## 2022-08-24 DIAGNOSIS — G459 Transient cerebral ischemic attack, unspecified: Secondary | ICD-10-CM | POA: Diagnosis not present

## 2022-08-24 LAB — CUP PACEART REMOTE DEVICE CHECK
Date Time Interrogation Session: 20240324232234
Implantable Pulse Generator Implant Date: 20211102

## 2022-09-01 ENCOUNTER — Other Ambulatory Visit: Payer: Self-pay | Admitting: Internal Medicine

## 2022-09-01 DIAGNOSIS — J449 Chronic obstructive pulmonary disease, unspecified: Secondary | ICD-10-CM

## 2022-09-03 NOTE — Progress Notes (Signed)
Carelink Summary Report / Loop Recorder 

## 2022-09-06 DIAGNOSIS — G4733 Obstructive sleep apnea (adult) (pediatric): Secondary | ICD-10-CM | POA: Diagnosis not present

## 2022-09-28 ENCOUNTER — Ambulatory Visit (INDEPENDENT_AMBULATORY_CARE_PROVIDER_SITE_OTHER): Payer: 59

## 2022-09-28 DIAGNOSIS — G459 Transient cerebral ischemic attack, unspecified: Secondary | ICD-10-CM

## 2022-09-28 LAB — CUP PACEART REMOTE DEVICE CHECK
Date Time Interrogation Session: 20240426230456
Implantable Pulse Generator Implant Date: 20211102

## 2022-09-30 ENCOUNTER — Other Ambulatory Visit: Payer: Self-pay | Admitting: Internal Medicine

## 2022-09-30 ENCOUNTER — Other Ambulatory Visit: Payer: Self-pay | Admitting: Family Medicine

## 2022-09-30 DIAGNOSIS — J449 Chronic obstructive pulmonary disease, unspecified: Secondary | ICD-10-CM

## 2022-09-30 NOTE — Telephone Encounter (Signed)
Gabapentin Last filled:  06/17/22, #450 Last OV:  07/13/22, 3 mo DM f/u Next OV:  10/27/22, CPE

## 2022-10-06 DIAGNOSIS — G4733 Obstructive sleep apnea (adult) (pediatric): Secondary | ICD-10-CM | POA: Diagnosis not present

## 2022-10-06 NOTE — Progress Notes (Signed)
Carelink Summary Report / Loop Recorder 

## 2022-10-14 ENCOUNTER — Ambulatory Visit (INDEPENDENT_AMBULATORY_CARE_PROVIDER_SITE_OTHER): Payer: 59

## 2022-10-14 ENCOUNTER — Ambulatory Visit (INDEPENDENT_AMBULATORY_CARE_PROVIDER_SITE_OTHER): Payer: 59 | Admitting: Internal Medicine

## 2022-10-14 ENCOUNTER — Encounter: Payer: Self-pay | Admitting: Internal Medicine

## 2022-10-14 VITALS — BP 120/82 | HR 77 | Temp 98.7°F | Ht 60.0 in | Wt 263.8 lb

## 2022-10-14 DIAGNOSIS — R0609 Other forms of dyspnea: Secondary | ICD-10-CM | POA: Diagnosis not present

## 2022-10-14 DIAGNOSIS — R059 Cough, unspecified: Secondary | ICD-10-CM | POA: Diagnosis not present

## 2022-10-14 DIAGNOSIS — J449 Chronic obstructive pulmonary disease, unspecified: Secondary | ICD-10-CM

## 2022-10-14 DIAGNOSIS — J9611 Chronic respiratory failure with hypoxia: Secondary | ICD-10-CM

## 2022-10-14 MED ORDER — BREZTRI AEROSPHERE 160-9-4.8 MCG/ACT IN AERO: 2.0000 | INHALATION_SPRAY | Freq: Two times a day (BID) | RESPIRATORY_TRACT | 0 refills | Status: DC

## 2022-10-14 MED ORDER — METHYLPREDNISOLONE ACETATE 80 MG/ML IJ SUSP
120.0000 mg | Freq: Once | INTRAMUSCULAR | Status: AC
Start: 2022-10-14 — End: 2022-10-14
  Administered 2022-10-14: 120 mg via INTRAMUSCULAR

## 2022-10-14 NOTE — Patient Instructions (Addendum)
Make sure you check your oxygen saturation  AT  your highest level of activity (not after you stop)   to be sure it stays over 90% and adjust  02 flow upward to maintain this level if needed but remember to turn it back to previous settings when you stop (to conserve your supply).  Zyrtec  or Zyrtec D  best for nasal symptoms  Depomedrol 120 IM   Please remember to go to the lab and x-ray department  for your tests - we will call you with the results when they are available.       Please schedule a follow up office visit in 6 weeks, call sooner if needed with all respiratory/nasal medications /inhalers/ solutions in hand so we can verify exactly what you are taking. This includes all medications from all doctors and over the counters

## 2022-10-14 NOTE — Assessment & Plan Note (Addendum)
07/30/2015   Walked RA  2 laps @ 185 ft each stopped due to  Sob with sats still 90% so rec 02 hs only plugged into cpap machine - as of 09/16/2017  02 = 2lpm at rest and 3lpm with activity as may be developing cor pulmonale - 01/19/2018   Walked 2lpm x  2 laps @ 185 ft each stopped due to  Sob with sats ok at moderate pace   10/14/2022 on 2lpm @ wt 263 no desats on 2lpm walking slow pace  Advised Make sure you check your oxygen saturation  AT  your highest level of activity (not after you stop)   to be sure it stays over 90% and adjust  02 flow upward to maintain this level if needed but remember to turn it back to previous settings when you stop (to conserve your supply).   Each maintenance medication was reviewed in detail including emphasizing most importantly the difference between maintenance and prns and under what circumstances the prns are to be triggered using an action plan format where appropriate.  Total time for H and P, chart review, counseling, reviewing 02 device(s) , directly observing portions of ambulatory 02 saturation study/ and generating customized AVS unique to this office visit / same day charting > 60 min ov with pt not seen in > 3 y

## 2022-10-14 NOTE — Progress Notes (Signed)
Subjective:   Patient ID: Brenda [name], female    DOB: 1960/10/31    MRN: [id number]   Brief patient profile:  62  yowf  MM/ Quit smoking  09/2021 wt  07/2015 = 250 and admitted with" aecopd" but with pfts nearly nl 2/29/15 p rx so was characterized as GOLD 0/ AB  But by 09/2017 met criteria for GOLD II    History of Present Illness  10/20/2017  f/u ov/Mahrosh Donnell re:  AB/  still smoking  And now meets criteria for GOLD II maint on dulera/spiriva  Chief Complaint  Patient presents with   Follow-up    Pt had full PFT prior to OV today. Pt has SOB with exertion, productive cough-yellow, wheezing, some chest tightness. Pt is on 2 liters of O2 con't.  Dyspnea:  [employer] struggle on 3lpm  Cough: better Sleep: cpap/ 30 degrees SABA use:  Needing less  rec No change  rx   01/19/2018  f/u ov/Davaun Quintela re: GOLD II and still smoking/ maint on symb/spiriva/ 02 2-3lpm and using med calendar well  Chief Complaint  Patient presents with   Follow-up    pt c/o worsening sob worse with warm weather.     Dyspnea:  MMRC3 = can't walk 100 yards even at a slow pace at a flat grade s stopping due to sob even on 3lpm  Cough: some worse since hot weather . 4-5 days  Sleeping: 30 degrees/cpap 3lpm SABA WUJ:WJXB daily though hfa suboptimal/ short ti rec For cough add gabapentin 300 mg at lunch and supper     09/15/2019  f/u ov/Marly Schuld re: COPD2-3 still smoking acute flare on breztri 2 bid / not using med calendar/ action plan prev provided  Chief Complaint  Patient presents with   Follow-up    Breathing worse over the past 10 days. She is coughing up yellow sputum and has has some yellow nasal d/c. She also c/o wheezing. She is using her proair about every 4 hours and neb at least 2 x per day.   Dyspnea:  MMRC3 = can't walk 100 yards even at a slow pace at a flat grade s stopping due to sob   Cough: x 10 days esp in am more prod assoc with same pnds/nasal discharge Sleeping: cpap/ 45 degrees  SABA use: hfa and  neb  02: 2lpm into cpap, 2lpm adjusts to how she feels  Rec zpak  Prednisone 10 mg take  4 each am x 2 days,   2 each am x 2 days,  1 each am x 2 days and stop  I emphasized that nasal steroids (flonase) have no immediate benefit For severe cough not responding to mucinex dm change to promethazine dm  One tsp every 4 hours  The key is to stop smoking completely before smoking completely stops you!   10/14/2022  re-establish  ov/Jaquari Reckner re: copd GOLD 2 AB/ 02 2lpm 24/7    maint on breztri but hfa suboptimal and on entresto with wt gain since stopped smoking 1 y prior to OV    Chief Complaint  Patient presents with   Consult    Wheezing and SOB x 2 weeks.  Dyspnea:  MMRC3 = can't walk 100 yards even at a slow pace at a flat grade s stopping due to sob   Cough: clear/ worse in am / nasal congestion x sev weeks with itching/sneezing as in the past  Sleeping: 45 degrees no cpap x one year and more freq awakening  with cough/ wheeze/ ? Since enteresto felt strangling on facemask and could not tolerate any longer  SABA use:  02: 2lpm     No obvious day to day or daytime variability or assoc  purulent sputum or mucus plugs or hemoptysis or cp or chest tightness,   or overt sinus or hb symptoms.     Also denies any obvious fluctuation of symptoms with weather or environmental changes or other aggravating or alleviating factors except as outlined above   No unusual exposure hx or h/o childhood pna/ asthma or knowledge of premature birth.  Current Allergies, Complete Past Medical History, Past Surgical History, Family History, and Social History were reviewed in [employer] record.  ROS  The following are not active complaints unless bolded Hoarseness, sore throat, dysphagia, dental problems, itching, sneezing,  nasal congestion or discharge of excess mucus or purulent secretions, ear ache,   fever, chills, sweats, unintended wt loss or wt gain, classically pleuritic or  exertional cp,  orthopnea pnd or arm/hand swelling  or leg swelling, presyncope, palpitations, abdominal pain, anorexia, nausea, vomiting, diarrhea  or change in bowel habits or change in bladder habits, change in stools or change in urine, dysuria, hematuria,  rash, arthralgias, visual complaints, headache, numbness, weakness or ataxia or problems with walking or coordination,  change in mood or  memory.        Current Meds  Medication Sig   albuterol (PROVENTIL) (2.5 MG/3ML) 0.083% nebulizer solution INHALE 1 VIAL VIA NEBULIZER EVERY 6 HOURS AS NEEDED FOR WHEEZING/SHORTNESS OF BREATH   albuterol (VENTOLIN HFA) 108 (90 Base) MCG/ACT inhaler Inhale 2 puffs into the lungs every 6 (six) hours as needed for wheezing or shortness of breath.   ARIPiprazole (ABILIFY) 10 MG tablet TAKE 1 TABLET BY MOUTH EVERY DAY   aspirin EC 81 MG EC tablet Take 1 tablet (81 mg total) by mouth daily. Swallow whole.   atorvastatin (LIPITOR) 80 MG tablet TAKE 1 TABLET BY MOUTH EVERYDAY AT BEDTIME   Budeson-Glycopyrrol-Formoterol (BREZTRI AEROSPHERE) 160-9-4.8 MCG/ACT AERO INHALE 2 PUFFS INTO LUNGS 2 TIMES DAILY   Cholecalciferol (VITAMIN D3) 25 MCG (1000 UT) CAPS Take 1 capsule (1,000 Units total) by mouth daily.   dextromethorphan-guaiFENesin (MUCINEX DM) 30-600 MG 12hr tablet Take 1 tablet by mouth 2 (two) times daily as needed for cough.   Dulaglutide (TRULICITY) 0.75 MG/0.5ML SOPN Inject 0.75 mg into the skin once a week.   ENTRESTO 24-26 MG TAKE 1 TABLET BY MOUTH TWICE A DAY   escitalopram (LEXAPRO) 20 MG tablet Take 1 tablet (20 mg total) by mouth daily.   fluticasone (FLONASE) 50 MCG/ACT nasal spray PLACE 1 SPRAY INTO BOTH NOSTRILS 2 (TWO) TIMES DAILY   furosemide (LASIX) 20 MG tablet TAKE 1 TABLET BY MOUTH EVERY DAY   gabapentin (NEURONTIN) 300 MG capsule TAKE 2 CAPSULES IN THE MORNING AND 3 CAPSULES AT NIGHT   JARDIANCE 10 MG TABS tablet TAKE 1 TABLET BY MOUTH EVERY DAY BEFORE BREAKFAST   LORazepam (ATIVAN) 0.5  MG tablet TAKE 1/2 TO 1 TABLET BY MOUTH TWICE A DAY AS NEEDED FOR ANXIETY   metoprolol succinate (TOPROL-XL) 25 MG 24 hr tablet TAKE 1 TABLET (25 MG TOTAL) BY MOUTH DAILY.   Multiple Vitamin (MULTIVITAMIN WITH MINERALS) TABS tablet Take 1 tablet by mouth daily.   nitroGLYCERIN (NITROSTAT) 0.4 MG SL tablet Place 1 tablet (0.4 mg total) under the tongue every 5 (five) minutes as needed.   nystatin (MYCOSTATIN) 100000 UNIT/ML suspension TAKE 1 TEASPOONFUL  BY MOUTH 3 TIMES DAILY AS NEEDED FOR MOUTH SORES.   spironolactone (ALDACTONE) 25 MG tablet TAKE 1/2 TABLET BY MOUTH EVERY DAY   traMADol (ULTRAM) 50 MG tablet TAKE 1 TABLET BY MOUTH EVERY 8 HOURS AS NEEDED FOR UP TO 5 DAYS FOR MODERATE PAIN                Objective:   Physical Exam   Wts  10/14/2022      263 09/15/2019      221  05/30/2019    225  10/06/2013         288 > 11/17/2013  288> 07/30/2015  266  > 08/12/2017   284 > 09/16/2017  278 > 01/19/2018   270 > . 06/20/2018   262 > 08/01/2018   252 > 02/08/2019     239    09/22/13 293 lb 12.8 oz (133.267 kg)  09/18/13 289 lb 4 oz (131.203 kg)  08/21/13 283 lb 8 oz (128.595 kg)      Vital signs reviewed  10/14/2022  - Note at rest 02 sats  94% on 2lpm cont    General appearance:    somber MO (by bmi) amb wf nad      HEENT : Oropharynx  clear      NECK :  without  apparent JVD/ palpable Nodes/TM    LUNGS: no acc muscle use,  Min barrel  contour chest wall with bilateral  slightly decreased bs s audible wheeze and  without cough on insp or exp maneuvers and min  Hyperresonant  to  percussion bilaterally    CV:  RRR  no s3 or murmur or increase in P2, and trace edema R > L   ABD:  ovbesee soft and nontender    MS:  Nl gait/ ext warm without deformities Or obvious joint restrictions  calf tenderness, cyanosis or clubbing     SKIN: warm and dry without lesions    NEURO:  alert, approp, nl sensorium with  no motor or cerebellar deficits apparent.         Labs ordered/ reviewed:      Chemistry      Component Value Date/Time   NA 138 10/14/2022 1619   NA 142 06/10/2020 1446   K 4.7 10/14/2022 1619   CL 100 10/14/2022 1619   CO2 26 10/14/2022 1619   BUN 14 10/14/2022 1619   BUN 11 06/10/2020 1446   CREATININE 1.14 10/14/2022 1619      Component Value Date/Time   CALCIUM 9.3 10/14/2022 1619   ALKPHOS 119 (H) 04/03/2022 0747   AST 11 04/03/2022 0747   ALT 13 04/03/2022 0747   BILITOT 0.4 04/03/2022 0747        Lab Results  Component Value Date   WBC 12.5 (H) 10/14/2022   HGB 15.4 (H) 10/14/2022   HCT 48.0 (H) 10/14/2022   MCV 90.9 10/14/2022   PLT 266.0 10/14/2022       EOS                                                              0.4  10/14/2022   Lab Results  Component Value Date   DDIMER 0.94 (H) 10/14/2022      Lab Results  Component Value Date   TSH 1.31 10/14/2022      BNP  10/14/2022   19        Assessment & Plan:

## 2022-10-15 ENCOUNTER — Other Ambulatory Visit: Payer: Self-pay | Admitting: Family Medicine

## 2022-10-15 DIAGNOSIS — E1169 Type 2 diabetes mellitus with other specified complication: Secondary | ICD-10-CM

## 2022-10-15 DIAGNOSIS — E559 Vitamin D deficiency, unspecified: Secondary | ICD-10-CM

## 2022-10-15 DIAGNOSIS — N1831 Chronic kidney disease, stage 3a: Secondary | ICD-10-CM

## 2022-10-15 DIAGNOSIS — E781 Pure hyperglyceridemia: Secondary | ICD-10-CM

## 2022-10-15 DIAGNOSIS — D72829 Elevated white blood cell count, unspecified: Secondary | ICD-10-CM

## 2022-10-15 LAB — CBC WITH DIFFERENTIAL/PLATELET
Basophils Absolute: 0.1 10*3/uL (ref 0.0–0.1)
Basophils Relative: 0.4 % (ref 0.0–3.0)
Eosinophils Absolute: 0.4 10*3/uL (ref 0.0–0.7)
Eosinophils Relative: 3.2 % (ref 0.0–5.0)
HCT: 48 % — ABNORMAL HIGH (ref 36.0–46.0)
Hemoglobin: 15.4 g/dL — ABNORMAL HIGH (ref 12.0–15.0)
Lymphocytes Relative: 20.1 % (ref 12.0–46.0)
Lymphs Abs: 2.5 10*3/uL (ref 0.7–4.0)
MCHC: 32.1 g/dL (ref 30.0–36.0)
MCV: 90.9 fl (ref 78.0–100.0)
Monocytes Absolute: 0.8 10*3/uL (ref 0.1–1.0)
Monocytes Relative: 6.5 % (ref 3.0–12.0)
Neutro Abs: 8.7 10*3/uL — ABNORMAL HIGH (ref 1.4–7.7)
Neutrophils Relative %: 69.8 % (ref 43.0–77.0)
Platelets: 266 10*3/uL (ref 150.0–400.0)
RBC: 5.28 Mil/uL — ABNORMAL HIGH (ref 3.87–5.11)
RDW: 15.9 % — ABNORMAL HIGH (ref 11.5–15.5)
WBC: 12.5 10*3/uL — ABNORMAL HIGH (ref 4.0–10.5)

## 2022-10-15 LAB — BASIC METABOLIC PANEL
BUN: 14 mg/dL (ref 6–23)
CO2: 26 mEq/L (ref 19–32)
Calcium: 9.3 mg/dL (ref 8.4–10.5)
Chloride: 100 mEq/L (ref 96–112)
Creatinine, Ser: 1.14 mg/dL (ref 0.40–1.20)
GFR: 50.96 mL/min — ABNORMAL LOW (ref >60.00)
Glucose, Bld: 79 mg/dL (ref 70–99)
Potassium: 4.7 mEq/L (ref 3.5–5.1)
Sodium: 138 mEq/L (ref 135–145)

## 2022-10-15 LAB — IGE: IgE (Immunoglobulin E), Serum: 184 kU/L — ABNORMAL HIGH (ref <=114)

## 2022-10-15 LAB — TSH: TSH: 1.31 u[IU]/mL (ref 0.35–5.50)

## 2022-10-15 LAB — BRAIN NATRIURETIC PEPTIDE: Pro B Natriuretic peptide (BNP): 19 pg/mL (ref 0.0–100.0)

## 2022-10-15 LAB — D-DIMER, QUANTITATIVE: D-Dimer, Quant: 0.94 mcg/mL FEU — ABNORMAL HIGH (ref <0.50)

## 2022-10-16 NOTE — Assessment & Plan Note (Addendum)
Quit smoking 09/2021  - alpha one AT screen   03/25/2015  MM level 176  Last worked Jun 29 2017 seeking full disability   - PFT's  10/20/2017  FEV1 1.16 (50 % ) ratio 58  p 14 % improvement from saba p dulera/spriva prior to study with DLCO  78 % corrects to 94 % for alv volume  erv  15%  - 05/30/2019   try bevespi 2 bid  - 10/14/2022  After extensive coaching inhaler device,  effectiveness =    75% from baseline of 50% > continue breztri  - Allergy screen 10/14/2022 >  Eos 0. 4/  IgE  184     When respiratory symptoms begin or become refractory well after a patient reports complete smoking cessation,  Especially when this wasn't the case while they were smoking, a red flag is raised based on the work of Dr Primitivo Gauze which states:  if you quit smoking when your best day FEV1 is still well preserved it is highly unlikely you will progress to severe disease.  That is to say, once the smoking stops,  the symptoms should not suddenly erupt or markedly worsen.  If so, the differential diagnosis should include  obesity/deconditioning,  LPR/Reflux/Aspiration syndromes,  occult CHF, or  especially side effect of medications commonly used in this population - entresto may be a concern here  Will try conventional rx for copd / chronic rhinitis and return to regroup in 6 weeks with all meds in hand using a trust but verify approach to confirm accurate Medication  Reconciliation The principal here is that until we are certain that the  patients are doing what we've asked, it makes no sense to ask them to do more.

## 2022-10-16 NOTE — Assessment & Plan Note (Signed)
She has gained 40 lb since quit smoking  with background of COPD/ Gold 2 dz and no evidence of chf / anemia/ thyroid dz  by  today's eval  At risk for DVT  - D dimer  is high normal value (seen commonly in the elderly or chronically ill)  may miss small peripheral pe, the clot burden with sob is moderately high and the d dimer  has a very high neg pred value if used in this setting.

## 2022-10-20 ENCOUNTER — Other Ambulatory Visit (INDEPENDENT_AMBULATORY_CARE_PROVIDER_SITE_OTHER): Payer: 59

## 2022-10-20 DIAGNOSIS — E1169 Type 2 diabetes mellitus with other specified complication: Secondary | ICD-10-CM

## 2022-10-20 DIAGNOSIS — N1831 Chronic kidney disease, stage 3a: Secondary | ICD-10-CM

## 2022-10-20 DIAGNOSIS — E559 Vitamin D deficiency, unspecified: Secondary | ICD-10-CM | POA: Diagnosis not present

## 2022-10-20 DIAGNOSIS — E781 Pure hyperglyceridemia: Secondary | ICD-10-CM

## 2022-10-20 LAB — LIPID PANEL
Cholesterol: 101 mg/dL (ref 0–200)
HDL: 43.7 mg/dL (ref >39.00)
LDL Cholesterol: 40 mg/dL (ref 0–99)
NonHDL: 57.49
Total CHOL/HDL Ratio: 2
Triglycerides: 85 mg/dL (ref 0.0–149.0)
VLDL: 17 mg/dL (ref 0.0–40.0)

## 2022-10-20 LAB — HEPATIC FUNCTION PANEL
ALT: 19 U/L (ref 0–35)
AST: 16 U/L (ref 0–37)
Albumin: 3.8 g/dL (ref 3.5–5.2)
Alkaline Phosphatase: 124 U/L — ABNORMAL HIGH (ref 39–117)
Bilirubin, Direct: 0.1 mg/dL (ref 0.0–0.3)
Total Bilirubin: 0.4 mg/dL (ref 0.2–1.2)
Total Protein: 7.4 g/dL (ref 6.0–8.3)

## 2022-10-20 LAB — VITAMIN D 25 HYDROXY (VIT D DEFICIENCY, FRACTURES): VITD: 36.19 ng/mL (ref 30.00–100.00)

## 2022-10-20 LAB — MICROALBUMIN / CREATININE URINE RATIO
Creatinine,U: 36.9 mg/dL
Microalb Creat Ratio: 1.9 mg/g (ref 0.0–30.0)
Microalb, Ur: <0.7 mg/dL (ref 0.0–1.9)

## 2022-10-20 LAB — PHOSPHORUS: Phosphorus: 3.8 mg/dL (ref 2.3–4.6)

## 2022-10-20 LAB — HEMOGLOBIN A1C: Hgb A1c MFr Bld: 6.4 % (ref 4.6–6.5)

## 2022-10-21 ENCOUNTER — Other Ambulatory Visit: Payer: Self-pay | Admitting: Family Medicine

## 2022-10-21 ENCOUNTER — Ambulatory Visit (INDEPENDENT_AMBULATORY_CARE_PROVIDER_SITE_OTHER): Payer: 59

## 2022-10-21 ENCOUNTER — Other Ambulatory Visit: Payer: Self-pay | Admitting: Cardiovascular Disease

## 2022-10-21 VITALS — Ht 60.0 in | Wt 263.0 lb

## 2022-10-21 DIAGNOSIS — E781 Pure hyperglyceridemia: Secondary | ICD-10-CM

## 2022-10-21 DIAGNOSIS — Z1231 Encounter for screening mammogram for malignant neoplasm of breast: Secondary | ICD-10-CM

## 2022-10-21 DIAGNOSIS — F39 Unspecified mood [affective] disorder: Secondary | ICD-10-CM

## 2022-10-21 DIAGNOSIS — Z0000 Encounter for general adult medical examination without abnormal findings: Secondary | ICD-10-CM | POA: Diagnosis not present

## 2022-10-21 LAB — PARATHYROID HORMONE, INTACT (NO CA): PTH: 55 pg/mL (ref 16–77)

## 2022-10-21 NOTE — Patient Instructions (Addendum)
Brenda [name] , Thank you for taking time to come for your Medicare Wellness Visit. I appreciate your ongoing commitment to your health goals. Please review the following plan we discussed and let me know if I can assist you in the future.   These are the goals we discussed:  Goals      Patient Stated     Would like to get off oxygen.     Stop or Cut Down Tobacco Use     Timeframe:  Long-Range Goal Priority:  High Start Date:      02/07/21                       Expected End Date:   08/07/21                    Follow Up Date Dec 2022   - cut down number of cigarettes by one-half - use over-the-counter gum, patch or lozenges - use Quit Line 1-800-Quit Now    Why is this important?   To stop or cut down it is important to have support from a person or group of people who you can count on.  You will also need to think about the things that make you feel like smoking, then plan for how to handle them.    Notes:      Weight (lb) < 200 lb (90.7 kg)     Try to lose weight, stay active.         This is a list of the screening recommended for you and due dates:  Health Maintenance  Topic Date Due   Zoster (Shingles) Vaccine (1 of 2) Never done   Colon Cancer Screening  Never done   COVID-19 Vaccine (3 - Pfizer risk series) 04/03/2020   Stool Blood Test  08/15/2020   Mammogram  10/05/2021   Pap Smear  08/07/2022   Flu Shot  12/31/2022   Complete foot exam   04/11/2023   Hemoglobin A1C  04/22/2023   Eye exam for diabetics  05/19/2023   Yearly kidney function blood test for diabetes  10/14/2023   Yearly kidney health urinalysis for diabetes  10/20/2023   Medicare Annual Wellness Visit  10/21/2023   DTaP/Tdap/Td vaccine (3 - Td or Tdap) 02/25/2027   Hepatitis C Screening: USPSTF Recommendation to screen - Ages 73-79 yo.  Completed   HIV Screening  Completed   HPV Vaccine  Aged Out    Advanced directives: has documents  Conditions/risks identified: Aim for 30 minutes of exercise or  brisk walking, 6-8 glasses of water, and 5 servings of fruits and vegetables each day.   Next appointment: Follow up in one year for your annual wellness visit. 10/26/23 @ 9:15 televisit  Preventive Care 40-64 Years, Female Preventive care refers to lifestyle choices and visits with your health care provider that can promote health and wellness. What does preventive care include? A yearly physical exam. This is also called an annual well check. Dental exams once or twice a year. Routine eye exams. Ask your health care provider how often you should have your eyes checked. Personal lifestyle choices, including: Daily care of your teeth and gums. Regular physical activity. Eating a healthy diet. Avoiding tobacco and drug use. Limiting alcohol use. Practicing safe sex. Taking low-dose aspirin daily starting at age 63. Taking vitamin and mineral supplements as recommended by your health care provider. What happens during an annual well check? The services and screenings  done by your health care provider during your annual well check will depend on your age, overall health, lifestyle risk factors, and family history of disease. Counseling  Your health care provider may ask you questions about your: Alcohol use. Tobacco use. Drug use. Emotional well-being. Home and relationship well-being. Sexual activity. Eating habits. Work and work Astronomer. Method of birth control. Menstrual cycle. Pregnancy history. Screening  You may have the following tests or measurements: Height, weight, and BMI. Blood pressure. Lipid and cholesterol levels. These may be checked every 5 years, or more frequently if you are over 40 years old. Skin check. Lung cancer screening. You may have this screening every year starting at age 78 if you have a 30-pack-year history of smoking and currently smoke or have quit within the past 15 years. Fecal occult blood test (FOBT) of the stool. You may have this test  every year starting at age 62. Flexible sigmoidoscopy or colonoscopy. You may have a sigmoidoscopy every 5 years or a colonoscopy every 10 years starting at age 45. Hepatitis C blood test. Hepatitis B blood test. Sexually transmitted disease (STD) testing. Diabetes screening. This is done by checking your blood sugar (glucose) after you have not eaten for a while (fasting). You may have this done every 1-3 years. Mammogram. This may be done every 1-2 years. Talk to your health care provider about when you should start having regular mammograms. This may depend on whether you have a family history of breast cancer. BRCA-related cancer screening. This may be done if you have a family history of breast, ovarian, tubal, or peritoneal cancers. Pelvic exam and Pap test. This may be done every 3 years starting at age 67. Starting at age 82, this may be done every 5 years if you have a Pap test in combination with an HPV test. Bone density scan. This is done to screen for osteoporosis. You may have this scan if you are at high risk for osteoporosis. Discuss your test results, treatment options, and if necessary, the need for more tests with your health care provider. Vaccines  Your health care provider may recommend certain vaccines, such as: Influenza vaccine. This is recommended every year. Tetanus, diphtheria, and acellular pertussis (Tdap, Td) vaccine. You may need a Td booster every 10 years. Zoster vaccine. You may need this after age 58. Pneumococcal 13-valent conjugate (PCV13) vaccine. You may need this if you have certain conditions and were not previously vaccinated. Pneumococcal polysaccharide (PPSV23) vaccine. You may need one or two doses if you smoke cigarettes or if you have certain conditions. Talk to your health care provider about which screenings and vaccines you need and how often you need them. This information is not intended to replace advice given to you by your health care  provider. Make sure you discuss any questions you have with your health care provider. Document Released: 06/14/2015 Document Revised: 02/05/2016 Document Reviewed: 03/19/2015 Elsevier Interactive Patient Education  2017 [employer].    Fall Prevention in the Home Falls can cause injuries. They can happen to people of all ages. There are many things you can do to make your home safe and to help prevent falls. What can I do on the outside of my home? Regularly fix the edges of walkways and driveways and fix any cracks. Remove anything that might make you trip as you walk through a door, such as a raised step or threshold. Trim any bushes or trees on the path to your home. Use bright  outdoor lighting. Clear any walking paths of anything that might make someone trip, such as rocks or tools. Regularly check to see if handrails are loose or broken. Make sure that both sides of any steps have handrails. Any raised decks and porches should have guardrails on the edges. Have any leaves, snow, or ice cleared regularly. Use sand or salt on walking paths during winter. Clean up any spills in your garage right away. This includes oil or grease spills. What can I do in the bathroom? Use night lights. Install grab bars by the toilet and in the tub and shower. Do not use towel bars as grab bars. Use non-skid mats or decals in the tub or shower. If you need to sit down in the shower, use a plastic, non-slip stool. Keep the floor dry. Clean up any water that spills on the floor as soon as it happens. Remove soap buildup in the tub or shower regularly. Attach bath mats securely with double-sided non-slip rug tape. Do not have throw rugs and other things on the floor that can make you trip. What can I do in the bedroom? Use night lights. Make sure that you have a light by your bed that is easy to reach. Do not use any sheets or blankets that are too big for your bed. They should not hang down onto the  floor. Have a firm chair that has side arms. You can use this for support while you get dressed. Do not have throw rugs and other things on the floor that can make you trip. What can I do in the kitchen? Clean up any spills right away. Avoid walking on wet floors. Keep items that you use a lot in easy-to-reach places. If you need to reach something above you, use a strong step stool that has a grab bar. Keep electrical cords out of the way. Do not use floor polish or wax that makes floors slippery. If you must use wax, use non-skid floor wax. Do not have throw rugs and other things on the floor that can make you trip. What can I do with my stairs? Do not leave any items on the stairs. Make sure that there are handrails on both sides of the stairs and use them. Fix handrails that are broken or loose. Make sure that handrails are as long as the stairways. Check any carpeting to make sure that it is firmly attached to the stairs. Fix any carpet that is loose or worn. Avoid having throw rugs at the top or bottom of the stairs. If you do have throw rugs, attach them to the floor with carpet tape. Make sure that you have a light switch at the top of the stairs and the bottom of the stairs. If you do not have them, ask someone to add them for you. What else can I do to help prevent falls? Wear shoes that: Do not have high heels. Have rubber bottoms. Are comfortable and fit you well. Are closed at the toe. Do not wear sandals. If you use a stepladder: Make sure that it is fully opened. Do not climb a closed stepladder. Make sure that both sides of the stepladder are locked into place. Ask someone to hold it for you, if possible. Clearly mark and make sure that you can see: Any grab bars or handrails. First and last steps. Where the edge of each step is. Use tools that help you move around (mobility aids) if they are needed. These include:  Canes. Walkers. Scooters. Crutches. Turn on the  lights when you go into a dark area. Replace any light bulbs as soon as they burn out. Set up your furniture so you have a clear path. Avoid moving your furniture around. If any of your floors are uneven, fix them. If there are any pets around you, be aware of where they are. Review your medicines with your doctor. Some medicines can make you feel dizzy. This can increase your chance of falling. Ask your doctor what other things that you can do to help prevent falls. This information is not intended to replace advice given to you by your health care provider. Make sure you discuss any questions you have with your health care provider. Document Released: 03/14/2009 Document Revised: 10/24/2015 Document Reviewed: 06/22/2014 Elsevier Interactive Patient Education  2017 [employer].

## 2022-10-21 NOTE — Progress Notes (Signed)
I connected with  Brenda [name] on 10/21/22 by a audio enabled telemedicine application and verified that I am speaking with the correct person using two identifiers.  Patient Location: Home  Provider Location: Home Office   I discussed the limitations of evaluation and management by telemedicine. The patient expressed understanding and agreed to proceed.  Subjective:   Brenda [name] is a 62 y.o. female who presents for Medicare Annual (Subsequent) preventive examination.  Review of Systems     Cardiac Risk Factors include: advanced age (>33men, >85 women);sedentary lifestyle;hypertension;diabetes mellitus;obesity (BMI >30kg/m2)     Objective:    Today's Vitals   10/21/22 1121 10/21/22 1122  Weight: 263 lb (119.3 kg)   Height: 5' (1.524 m)   PainSc:  5    Body mass index is 51.36 kg/m.     10/21/2022   11:36 AM 10/15/2021   12:06 PM 11/03/2020    5:00 AM 04/02/2020   10:39 AM 02/07/2020   10:33 PM 01/30/2020   10:21 AM 05/03/2019    4:08 PM  Advanced Directives  Does Patient Have a Medical Advance Directive? Yes Yes No Yes No No No  Type of Estate agent of [place];Living will Healthcare Power of [place];Living will       Does patient want to make changes to medical advance directive? No - Patient declined        Copy of Healthcare Power of Attorney in Chart? Yes - validated most recent copy scanned in chart (See row information) Yes - validated most recent copy scanned in chart (See row information)       Would patient like information on creating a medical advance directive?   No - Patient declined  No - Patient declined No - Patient declined     Current Medications (verified) Outpatient Encounter Medications as of 10/21/2022  Medication Sig   albuterol (PROVENTIL) (2.5 MG/3ML) 0.083% nebulizer solution INHALE 1 VIAL VIA NEBULIZER EVERY 6 HOURS AS NEEDED FOR WHEEZING/SHORTNESS OF BREATH   albuterol (VENTOLIN HFA) 108 (90 Base) MCG/ACT inhaler Inhale 2  puffs into the lungs every 6 (six) hours as needed for wheezing or shortness of breath.   ARIPiprazole (ABILIFY) 10 MG tablet TAKE 1 TABLET BY MOUTH EVERY DAY   aspirin EC 81 MG EC tablet Take 1 tablet (81 mg total) by mouth daily. Swallow whole.   atorvastatin (LIPITOR) 80 MG tablet TAKE 1 TABLET BY MOUTH EVERYDAY AT BEDTIME   Budeson-Glycopyrrol-Formoterol (BREZTRI AEROSPHERE) 160-9-4.8 MCG/ACT AERO INHALE 2 PUFFS INTO LUNGS 2 TIMES DAILY   Budeson-Glycopyrrol-Formoterol (BREZTRI AEROSPHERE) 160-9-4.8 MCG/ACT AERO Inhale 2 puffs into the lungs in the morning and at bedtime.   Cholecalciferol (VITAMIN D3) 25 MCG (1000 UT) CAPS Take 1 capsule (1,000 Units total) by mouth daily.   dextromethorphan-guaiFENesin (MUCINEX DM) 30-600 MG 12hr tablet Take 1 tablet by mouth 2 (two) times daily as needed for cough.   Dulaglutide (TRULICITY) 0.75 MG/0.5ML SOPN Inject 0.75 mg into the skin once a week.   ENTRESTO 24-26 MG TAKE 1 TABLET BY MOUTH TWICE A DAY   escitalopram (LEXAPRO) 20 MG tablet TAKE 1 TABLET BY MOUTH EVERY DAY   fluticasone (FLONASE) 50 MCG/ACT nasal spray PLACE 1 SPRAY INTO BOTH NOSTRILS 2 (TWO) TIMES DAILY   furosemide (LASIX) 20 MG tablet TAKE 1 TABLET BY MOUTH EVERY DAY   gabapentin (NEURONTIN) 300 MG capsule TAKE 2 CAPSULES IN THE MORNING AND 3 CAPSULES AT NIGHT   JARDIANCE 10 MG TABS tablet TAKE 1 TABLET  BY MOUTH EVERY DAY BEFORE BREAKFAST   LORazepam (ATIVAN) 0.5 MG tablet TAKE 1/2 TO 1 TABLET BY MOUTH TWICE A DAY AS NEEDED FOR ANXIETY   metoprolol succinate (TOPROL-XL) 25 MG 24 hr tablet TAKE 1 TABLET (25 MG TOTAL) BY MOUTH DAILY.   Multiple Vitamin (MULTIVITAMIN WITH MINERALS) TABS tablet Take 1 tablet by mouth daily.   spironolactone (ALDACTONE) 25 MG tablet TAKE 1/2 TABLET BY MOUTH EVERY DAY   traMADol (ULTRAM) 50 MG tablet TAKE 1 TABLET BY MOUTH EVERY 8 HOURS AS NEEDED FOR UP TO 5 DAYS FOR MODERATE PAIN   nitroGLYCERIN (NITROSTAT) 0.4 MG SL tablet Place 1 tablet (0.4 mg total)  under the tongue every 5 (five) minutes as needed. (Patient not taking: Reported on 10/21/2022)   nystatin (MYCOSTATIN) 100000 UNIT/ML suspension TAKE 1 TEASPOONFUL BY MOUTH 3 TIMES DAILY AS NEEDED FOR MOUTH SORES. (Patient not taking: Reported on 10/21/2022)   No facility-administered encounter medications on file as of 10/21/2022.    Allergies (verified) Metolazone   History: Past Medical History:  Diagnosis Date   Acute pancreatitis    Asthma    main dyspnea thought due to deconditioning (10/2013)   Bipolar I disorder, most recent episode (or current) manic, unspecified    pt denies this   Bronchitis, chronic obstructive 2008 & 2009   FeV1 64% TLC 105% DLCO 55% 2008  -  FeV1 81% FeF 25-75 43% 2009   CAD (coronary artery disease)    inferior STEMI in 10/2020 s/p DES to RCA   Chronic combined systolic and diastolic CHF (congestive heart failure) (HCC)    LVEF 30-35% in 01/2020 but improved to 60-65% in 05/2021   COPD (chronic obstructive pulmonary disease) (HCC)    emphysema, not an issue per pulm (10/2013)   History of pyelonephritis 05/2012   History of ST elevation myocardial infarction (STEMI)    HTN (hypertension)    Hx of migraines    Hyperlipidemia    Knee pain, right    Lumbar disc disease with radiculopathy    multilevel spondylitic changes, scoliosis, and anterolisthesis L4 not thought to currently be surg candidate (Dr. Phoebe Perch, [facility]) (Dr. Ollen Bowl, [facility])   Nicotine addiction    Obesity    RSV (respiratory syncytial virus pneumonia) 01/2020   hospitalization   Suicide attempt (HCC) 06/2001   Swelling of limb    TIA (transient ischemic attack)    Type 2 diabetes mellitus (HCC)    Urge and stress incontinence 07/2012   ([facility])   Past Surgical History:  Procedure Laterality Date   BUBBLE STUDY  04/02/2020   Procedure: BUBBLE STUDY;  Surgeon: Vesta Mixer, MD;  [facility] ENDOSCOPY;  Service: Cardiovascular;;   CHOLECYSTECTOMY  07/1982   CORONARY/GRAFT  ACUTE MI REVASCULARIZATION N/A 11/02/2020   Procedure: Coronary/Graft Acute MI Revascularization;  Surgeon: Yvonne Kendall, MD;  Location: [facility] INVASIVE CV LAB;  Service: Cardiovascular;  Laterality: N/A;   CT MAXILLOFACIAL WO/W CM  06/14/06   Left max mucocele   CT of chest  06/14/2006   Normal except c/w active inflammation / infection.  Left renal atrophy   ERCP / sphincterotomy - stenosis  01/15/06   IR ANGIO INTRA EXTRACRAN SEL COM CAROTID INNOMINATE BILAT MOD SED  02/09/2020   IR ANGIO VERTEBRAL SEL SUBCLAVIAN INNOMINATE UNI L MOD SED  02/09/2020   IR ANGIO VERTEBRAL SEL VERTEBRAL UNI R MOD SED  02/09/2020   IR ANGIO VERTEBRAL SEL VERTEBRAL UNI R MOD SED  02/13/2020   IR US  GUIDE VASC ACCESS RIGHT  02/09/2020   IR US GUIDE VASC ACCESS RIGHT  02/13/2020   LEFT HEART CATH AND CORONARY ANGIOGRAPHY N/A 11/02/2020   Procedure: LEFT HEART CATH AND CORONARY ANGIOGRAPHY;  Surgeon: Yvonne Kendall, MD;  Location: [facility] INVASIVE CV LAB;  Service: Cardiovascular;  Laterality: N/A;   LOOP RECORDER INSERTION N/A 04/02/2020   Procedure: LOOP RECORDER INSERTION;  Surgeon: Marinus Maw, MD;  Location: [facility] INVASIVE CV LAB;  Service: Cardiovascular;  Laterality: N/A;   lumbar MRI  11/2010   multilevel spondylitic changes with upper lumbar scoliosis and anterolisthesis of L4 on 5 with some L foraminal narrowing esp at L/3 with concavity of scoliosis, some central stenosis and biforaminal narrowing at L4/5 with spondylolisthesis   [place] - Pneumonia, acute asthma, left maxillary sinusitis  01/11 - 06/18/2006   RADIOLOGY WITH ANESTHESIA N/A 02/13/2020   Procedure: Carotid Stent;  Surgeon: Julieanne Cotton, MD;  Location: [facility] OR;  Service: Radiology;  Laterality: N/A;   Retained stone     ERCP secondary to retained stone   Right knee surgery  1984,1989,1989,1991,1994   Reconstructions for ACL insuff   TEE WITHOUT CARDIOVERSION N/A 04/02/2020   Procedure: TRANSESOPHAGEAL ECHOCARDIOGRAM (TEE);  Surgeon: Elease Hashimoto  Deloris Ping, MD;  Location[facility] ENDOSCOPY;  Service: Cardiovascular;  Laterality: N/A;   TUBAL LIGATION     Family History  Problem Relation Age of Onset   Heart disease Brother        MI in early 35's   Hypertension Mother    Hyperlipidemia Mother    Heart disease Mother    Hypertension Father    Asthma Father    Breast cancer Paternal Grandmother    Breast cancer Paternal Aunt    Social History   Socioeconomic History   Marital status: Widowed    Spouse name: Not on file   Number of children: 3   Years of education: Not on file   Highest education level: Not on file  Occupational History   Occupation: Geographical information systems officer    Employer: [employer]     Employer: [employer]   Tobacco Use   Smoking status: Former    Packs/day: 0.25    Years: 41.00    Additional pack years: 0.00    Total pack years: 10.25    Types: Cigarettes    Quit date: 10/01/2021    Years since quitting: 1.0    Passive exposure: Current   Smokeless tobacco: Never  Vaping Use   Vaping Use: Never used  Substance and Sexual Activity   Alcohol use: Yes    Alcohol/week: 0.0 standard drinks of alcohol    Comment: occasional   Drug use: Not Currently    Types: Marijuana   Sexual activity: Not on file  Other Topics Concern   Not on file  Social History Narrative   Lives with husband and son and daughter in law and grandchild   Occupation: Media planner at [employer]   Activity: limited by dyspnea   Diet: good water, fruits/vegetables daily   Social Determinants of Health   Financial Resource Strain: Low Risk  (10/21/2022)   Overall Financial Resource Strain (CARDIA)    Difficulty of Paying Living Expenses: Not hard at all  Food Insecurity: No Food Insecurity (10/21/2022)   Hunger Vital Sign    Worried About Running Out of Food in the Last Year: Never true    Ran Out of Food in the Last Year: Never true  Transportation Needs: No  Transportation Needs (10/21/2022)    PRAPARE - Administrator, Civil Service (Medical): No    Lack of Transportation (Non-Medical): No  Physical Activity: Insufficiently Active (10/21/2022)   Exercise Vital Sign    Days of Exercise per Week: 7 days    Minutes of Exercise per Session: 20 min  Stress: No Stress Concern Present (10/21/2022)   [employer] of Occupational Health - Occupational Stress Questionnaire    Feeling of Stress : Not at all  Social Connections: Socially Isolated (10/21/2022)   Social Connection and Isolation Panel [NHANES]    Frequency of Communication with Friends and Family: More than three times a week    Frequency of Social Gatherings with Friends and Family: More than three times a week    Attends Religious Services: Never    Database administrator or Organizations: No    Attends Banker Meetings: Never    Marital Status: Widowed    Tobacco Counseling Counseling given: Not Answered   Clinical Intake:  Pre-visit preparation completed: Yes  Pain : 0-10 Pain Score: 5  Pain Type: Chronic pain Pain Location: Back Pain Orientation: Lower Pain Descriptors / Indicators: Burning Pain Onset: Other (comment) (years)     Nutritional Risks: Unintentional weight gain (20 pound over the last year , pt quit smoking) Diabetes: Yes CBG done?: Yes (127 per pt) CBG resulted in Enter/ Edit results?: No Did pt. bring in CBG monitor from home?: No  How often do you need to have someone help you when you read instructions, pamphlets, or other written materials from your doctor or pharmacy?: 1 - Never  Diabetic?Nutrition Risk Assessment:  Has the patient had any N/V/D within the last 2 months?  No  Does the patient have any non-healing wounds?  No  Has the patient had any unintentional weight loss or weight gain?  Yes , 20 pound in the last year.  Diabetes:  Is the patient diabetic?  Yes  If diabetic, was a CBG obtained today?  Yes  Did the patient bring in their  glucometer from home?  No  How often do you monitor your CBG's? weekly.   Financial Strains and Diabetes Management:  Are you having any financial strains with the device, your supplies or your medication? No .  Does the patient want to be seen by Chronic Care Management for management of their diabetes?  No  Would the patient like to be referred to a Nutritionist or for Diabetic Management?  No   Diabetic Exams:  Diabetic Eye Exam: Completed 05/18/22 Dr.Frazier Diabetic Foot Exam: Completed 04/10/22 PCP    Interpreter Needed?: No  Information entered by :: C.Sherl Yzaguirre LPN   Activities of Daily Living    10/21/2022   11:37 AM  In your present state of health, do you have any difficulty performing the following activities:  Hearing? 0  Vision? 0  Difficulty concentrating or making decisions? 0  Walking or climbing stairs? 1  Comment On O2  Dressing or bathing? 0  Doing errands, shopping? 0  Preparing Food and eating ? N  Using the Toilet? N  In the past six months, have you accidently leaked urine? Y  Comment occasionally  Do you have problems with loss of bowel control? N  Managing your Medications? N  Managing your Finances? N  Housekeeping or managing your Housekeeping? N    Patient Care Team: [name], MD as PCP - General O'Neal, Ronnald Ramp, MD as PCP - Cardiology (Internal  Medicine) Kathyrn Sheriff, [facility] as Pharmacist (Pharmacist)  Indicate any recent Medical Services you may have received from other than [facility] providers in the past year (date may be approximate).     Assessment:   This is a routine wellness examination for Brenda [name].  Hearing/Vision screen Hearing Screening - Comments:: No aids- no issues with hearing Vision Screening - Comments:: Glasses - Dr.Fraizer  Dietary issues and exercise activities discussed: Current Exercise Habits: Home exercise routine, Type of exercise: stretching, Time (Minutes): 20, Frequency (Times/Week): 7, Weekly  Exercise (Minutes/Week): 140, Intensity: Mild, Exercise limited by: respiratory conditions(s) (COPD)   Goals Addressed             This Visit's Progress    Patient Stated       Would like to get off oxygen.       Depression Screen    10/21/2022   11:35 AM 07/13/2022   12:24 PM 10/15/2021   12:03 PM 10/08/2021   11:55 AM 10/01/2020   12:15 PM 02/23/2020   12:24 PM 08/07/2019    4:32 PM  PHQ 2/9 Scores  PHQ - 2 Score 0 0 1 1 0 6 4  PHQ- 9 Score  3 1 5 1 20 17     Fall Risk    10/21/2022   11:36 AM 07/13/2022   12:24 PM 10/15/2021   12:07 PM 10/08/2021   11:18 AM 10/01/2020   11:07 AM  Fall Risk   Falls in the past year? 0 0 0 0 0  Number falls in past yr: 0  0    Injury with Fall? 0  0    Risk for fall due to : No Fall Risks  No Fall Risks    Follow up Falls prevention discussed;Falls evaluation completed  Falls prevention discussed      FALL RISK PREVENTION PERTAINING TO THE HOME:  Any stairs in or around the home? No  If so, are there any without handrails? No  Home free of loose throw rugs in walkways, pet beds, electrical cords, etc? Yes  Adequate lighting in your home to reduce risk of falls? Yes   ASSISTIVE DEVICES UTILIZED TO PREVENT FALLS:  Life alert? No  Use of a cane, walker or w/c? No  Grab bars in the bathroom? Yes  Shower chair or bench in shower? Yes  Elevated toilet seat or a handicapped toilet? Yes   Cognitive Function:        10/21/2022   11:39 AM 10/15/2021   12:08 PM  6CIT Screen  What Year? 0 points 0 points  What month? 0 points 0 points  What time? 0 points 0 points  Count back from 20 0 points 0 points  Months in reverse 0 points 0 points  Repeat phrase 2 points 0 points  Total Score 2 points 0 points    Immunizations Immunization History  Administered Date(s) Administered   Influenza Whole 04/01/2002   Influenza,inj,Quad PF,6+ Mos 03/21/2015, 07/27/2018, 02/08/2019, 02/23/2020, 04/04/2021, 04/10/2022   Influenza-Unspecified  03/01/2014, 04/22/2016, 03/25/2017   PFIZER(Purple Top)SARS-COV-2 Vaccination 02/09/2020, 03/06/2020   PNEUMOCOCCAL CONJUGATE-20 10/01/2020   Pneumococcal Polysaccharide-23 04/01/2002, 07/24/2015   Td 06/01/1993   Tdap 02/24/2017    TDAP status: Up to date  Flu Vaccine status: Up to date  Pneumococcal vaccine status: Up to date  Covid-19 vaccine status: Information provided on how to obtain vaccines.   Qualifies for Shingles Vaccine? Yes   Zostavax completed No   Shingrix Completed?: No.    Education has  been provided regarding the importance of this vaccine. Patient has been advised to call insurance company to determine out of pocket expense if they have not yet received this vaccine. Advised may also receive vaccine at local pharmacy or Health Dept. Verbalized acceptance and understanding.  Screening Tests Health Maintenance  Topic Date Due   Zoster Vaccines- Shingrix (1 of 2) Never done   COLONOSCOPY (Pts 45-60yrs Insurance coverage will need to be confirmed)  Never done   COVID-19 Vaccine (3 - Pfizer risk series) 04/03/2020   COLON CANCER SCREENING ANNUAL FOBT  08/15/2020   MAMMOGRAM  10/05/2021   PAP SMEAR-Modifier  08/07/2022   INFLUENZA VACCINE  12/31/2022   FOOT EXAM  04/11/2023   HEMOGLOBIN A1C  04/22/2023   OPHTHALMOLOGY EXAM  05/19/2023   Diabetic kidney evaluation - eGFR measurement  10/14/2023   Diabetic kidney evaluation - Urine ACR  10/20/2023   Medicare Annual Wellness (AWV)  10/21/2023   DTaP/Tdap/Td (3 - Td or Tdap) 02/25/2027   Hepatitis C Screening  Completed   HIV Screening  Completed   HPV VACCINES  Aged Out    Health Maintenance  Health Maintenance Due  Topic Date Due   Zoster Vaccines- Shingrix (1 of 2) Never done   COLONOSCOPY (Pts 45-68yrs Insurance coverage will need to be confirmed)  Never done   COVID-19 Vaccine (3 - Pfizer risk series) 04/03/2020   COLON CANCER SCREENING ANNUAL FOBT  08/15/2020   MAMMOGRAM  10/05/2021   PAP  SMEAR-Modifier  08/07/2022    Colorectal cancer screening: Type of screening: FOBT/FIT. Completed 08/16/19. Repeat every 1 years Pt declined colonoscopy  Mammogram status: Ordered 10/21/22. Pt provided with contact info and advised to call to schedule appt.    Lung Cancer Screening: (Low Dose CT Chest recommended if Age 67-80 years, 30 pack-year currently smoking OR have quit w/in 15years.) does not qualify.   Lung Cancer Screening Referral: no  Additional Screening:  Hepatitis C Screening: does qualify; Completed 01/31/2020  Vision Screening: Recommended annual ophthalmology exams for early detection of glaucoma and other disorders of the eye. Is the patient up to date with their annual eye exam?  Yes  Who is the provider or what is the name of the office in which the patient attends annual eye exams? Dr.Frazier If pt is not established with a provider, would they like to be referred to a provider to establish care? No .   Dental Screening: Recommended annual dental exams for proper oral hygiene  Community Resource Referral / Chronic Care Management: CRR required this visit?  No   CCM required this visit?  No      Plan:     I have personally reviewed and noted the following in the patient's chart:   Medical and social history Use of alcohol, tobacco or illicit drugs  Current medications and supplements including opioid prescriptions. Patient is not currently taking opioid prescriptions. Functional ability and status Nutritional status Physical activity Advanced directives List of other physicians Hospitalizations, surgeries, and ER visits in previous 12 months Vitals Screenings to include cognitive, depression, and falls Referrals and appointments  In addition, I have reviewed and discussed with patient certain preventive protocols, quality metrics, and best practice recommendations. A written personalized care plan for preventive services as well as general  preventive health recommendations were provided to patient.     Maryan Puls, LPN   09/07/8117   Nurse Notes: Order for mammogram placed.

## 2022-10-22 ENCOUNTER — Other Ambulatory Visit: Payer: Self-pay | Admitting: Internal Medicine

## 2022-10-22 DIAGNOSIS — J449 Chronic obstructive pulmonary disease, unspecified: Secondary | ICD-10-CM

## 2022-10-25 ENCOUNTER — Other Ambulatory Visit: Payer: Self-pay | Admitting: Family Medicine

## 2022-10-27 ENCOUNTER — Ambulatory Visit (INDEPENDENT_AMBULATORY_CARE_PROVIDER_SITE_OTHER): Payer: 59 | Admitting: Family Medicine

## 2022-10-27 ENCOUNTER — Encounter: Payer: Self-pay | Admitting: Family Medicine

## 2022-10-27 VITALS — BP 134/82 | HR 70 | Temp 97.1°F | Ht 60.0 in | Wt 261.5 lb

## 2022-10-27 DIAGNOSIS — Z6841 Body Mass Index (BMI) 40.0 and over, adult: Secondary | ICD-10-CM

## 2022-10-27 DIAGNOSIS — J449 Chronic obstructive pulmonary disease, unspecified: Secondary | ICD-10-CM | POA: Diagnosis not present

## 2022-10-27 DIAGNOSIS — Z8673 Personal history of transient ischemic attack (TIA), and cerebral infarction without residual deficits: Secondary | ICD-10-CM | POA: Diagnosis not present

## 2022-10-27 DIAGNOSIS — Z0000 Encounter for general adult medical examination without abnormal findings: Secondary | ICD-10-CM

## 2022-10-27 DIAGNOSIS — I2510 Atherosclerotic heart disease of native coronary artery without angina pectoris: Secondary | ICD-10-CM

## 2022-10-27 DIAGNOSIS — M5442 Lumbago with sciatica, left side: Secondary | ICD-10-CM

## 2022-10-27 DIAGNOSIS — I2781 Cor pulmonale (chronic): Secondary | ICD-10-CM

## 2022-10-27 DIAGNOSIS — N1831 Chronic kidney disease, stage 3a: Secondary | ICD-10-CM

## 2022-10-27 DIAGNOSIS — E781 Pure hyperglyceridemia: Secondary | ICD-10-CM | POA: Diagnosis not present

## 2022-10-27 DIAGNOSIS — G459 Transient cerebral ischemic attack, unspecified: Secondary | ICD-10-CM

## 2022-10-27 DIAGNOSIS — Z87891 Personal history of nicotine dependence: Secondary | ICD-10-CM

## 2022-10-27 DIAGNOSIS — I10 Essential (primary) hypertension: Secondary | ICD-10-CM | POA: Diagnosis not present

## 2022-10-27 DIAGNOSIS — J9611 Chronic respiratory failure with hypoxia: Secondary | ICD-10-CM

## 2022-10-27 DIAGNOSIS — F39 Unspecified mood [affective] disorder: Secondary | ICD-10-CM

## 2022-10-27 DIAGNOSIS — I5042 Chronic combined systolic (congestive) and diastolic (congestive) heart failure: Secondary | ICD-10-CM

## 2022-10-27 DIAGNOSIS — E559 Vitamin D deficiency, unspecified: Secondary | ICD-10-CM

## 2022-10-27 DIAGNOSIS — R0981 Nasal congestion: Secondary | ICD-10-CM

## 2022-10-27 DIAGNOSIS — Z7985 Long-term (current) use of injectable non-insulin antidiabetic drugs: Secondary | ICD-10-CM

## 2022-10-27 DIAGNOSIS — K760 Fatty (change of) liver, not elsewhere classified: Secondary | ICD-10-CM

## 2022-10-27 DIAGNOSIS — G4733 Obstructive sleep apnea (adult) (pediatric): Secondary | ICD-10-CM

## 2022-10-27 DIAGNOSIS — Z1211 Encounter for screening for malignant neoplasm of colon: Secondary | ICD-10-CM

## 2022-10-27 DIAGNOSIS — E1169 Type 2 diabetes mellitus with other specified complication: Secondary | ICD-10-CM

## 2022-10-27 DIAGNOSIS — I700 Atherosclerosis of aorta: Secondary | ICD-10-CM

## 2022-10-27 DIAGNOSIS — M5441 Lumbago with sciatica, right side: Secondary | ICD-10-CM

## 2022-10-27 DIAGNOSIS — G8929 Other chronic pain: Secondary | ICD-10-CM

## 2022-10-27 MED ORDER — TRULICITY 0.75 MG/0.5ML ~~LOC~~ SOAJ: 0.7500 mg | SUBCUTANEOUS | 4 refills | Status: DC

## 2022-10-27 MED ORDER — CETIRIZINE HCL 10 MG PO TABS: 10.0000 mg | ORAL_TABLET | Freq: Every day | ORAL | Status: DC

## 2022-10-27 MED ORDER — ATORVASTATIN CALCIUM 80 MG PO TABS
ORAL_TABLET | ORAL | 4 refills | Status: DC
Start: 2022-10-27 — End: 2023-10-29

## 2022-10-27 MED ORDER — ARIPIPRAZOLE 10 MG PO TABS: 10.0000 mg | ORAL_TABLET | Freq: Every day | ORAL | 4 refills | Status: DC

## 2022-10-27 MED ORDER — GABAPENTIN 300 MG PO CAPS: ORAL_CAPSULE | ORAL | 4 refills | Status: DC

## 2022-10-27 MED ORDER — AMOXICILLIN-POT CLAVULANATE 875-125 MG PO TABS: 1.0000 | ORAL_TABLET | Freq: Two times a day (BID) | ORAL | 0 refills | Status: AC

## 2022-10-27 MED ORDER — ESCITALOPRAM OXALATE 20 MG PO TABS: 20.0000 mg | ORAL_TABLET | Freq: Every day | ORAL | 4 refills | Status: DC

## 2022-10-27 NOTE — Assessment & Plan Note (Signed)
On CPAP with oxygen through pulm.

## 2022-10-27 NOTE — Assessment & Plan Note (Addendum)
Appreciate pulm care. Continues Breztri with albuterol and supplemental oxygen. Some wheezing heard today but seems at baseline

## 2022-10-27 NOTE — Assessment & Plan Note (Signed)
Severe by CT 2019. Mild persistent transaminitis - discussed with pt. Update liver ultrasound. Low risk Fib4 score:  Fibrosis 4 Score = .88 (Low risk)      Interpretation for patients with NAFLD          <1.30       -  F0-F1 (Low risk)          1.30-2.67 -  Indeterminate           >2.67      -  F3-F4 (High risk)     Validated for ages 27-65

## 2022-10-27 NOTE — Assessment & Plan Note (Signed)
Ongoing congestion present for weeks associated with left ear effusion and left eye redness, swelling, discharge which pt states is present for 3 months. Will Rx augmentin 7d course to cover possible bacterial sinusitis/conjunctivitis component to chronic sinus congestion.

## 2022-10-27 NOTE — Assessment & Plan Note (Signed)
Kidney function remains stable with GFR 50.

## 2022-10-27 NOTE — Assessment & Plan Note (Signed)
Chronic, stable period on lexapro, abilify, with PRN lorazepam.

## 2022-10-27 NOTE — Assessment & Plan Note (Signed)
Chronic, stable period on current regimen - continue.  

## 2022-10-27 NOTE — Assessment & Plan Note (Signed)
Continue aspirin, statin.  

## 2022-10-27 NOTE — Progress Notes (Signed)
Ph: [phone] Fax: [phone]   Patient ID: Brenda [name], female    DOB: 1960/10/03, 62 y.o.   MRN: [id number]  This visit was conducted in person.  BP 134/82   Pulse 70   Temp (!) 97.1 F (36.2 C) (Temporal)   Ht 5' (1.524 m)   Wt 261 lb 8 oz (118.6 kg)   LMP 03/08/2013   SpO2 94% Comment: 2 L  BMI 51.07 kg/m    CC: CPE Subjective:   HPI: Brenda [name] is a 62 y.o. female presenting on 10/27/2022 for Annual Exam (MCR prt 2 [AWV- 10/21/22].)   Saw health advisor last week for medicare wellness visit. Note reviewed.   No results found.  Flowsheet Row Clinical Support from 10/21/2022 in [facility] [place]  PHQ-2 Total Score 0          10/21/2022   11:36 AM 07/13/2022   12:24 PM 10/15/2021   12:07 PM 10/08/2021   11:18 AM 10/01/2020   11:07 AM  Fall Risk   Falls in the past year? 0 0 0 0 0  Number falls in past yr: 0  0    Injury with Fall? 0  0    Risk for fall due to : No Fall Risks  No Fall Risks    Follow up Falls prevention discussed;Falls evaluation completed  Falls prevention discussed     Notes recent sinus congestion ear discomfort normally managed with zyrtec 10mg  daily and flonase 1 squirt twice daily. Clear mucous when blowing nose. No fevers/chills. Notes 3 months of L>R eyelid swelling and matting. Redness to conjunctiva in the mornings. Using systane lubricating eye drops.   O2 dependent asthmatic bronchitis/COPD on breztri 2 puffs BID and oxygen at 2L via [place] followed by pulm Dr Sherene Sires. Recent treatment for COPD flare. Has needed alb inh/neb daily. Received methylprednisolone 120mg  IM 2 wks ago at pulm appt.  Ex smoker - quit 1 yr ago.   Hospitalization summer 2021 with takotsubo cardiomyopathy in setting of RSV pneumonia and COPD exacerbation, re-hospitalized 01/2020 with acute L sided weakness, slurred speech, n/v - found to have basilar artery stenosis with improvement on its own so no stent placed (TIA) - completed DAPT x3  months. Cryptogenic stroke - has implantable monitor.  Subsequent inferior STEMI 10/2020 s/p PCI to distal RCA, s/p aspirin + brilinta x1 year now only aspirin 81mg  daily.   Sees cardiology Dr Flora Lipps and EP  Preventative: Colon cancer screening - iFOB normal 07/2019, desires continued screen Well woman exam - never abnormal cervical screen, normal 08/2014, 09/2015, 07/2019. H/o BTL. Referred to GYN for abnormal cervical exam - exam normal on their evaluation. discussed rpt final pap next year [facility] - [facility]  - rpt scheduled 10/2022 Lung cancer screening - 30+ PY hx - agrees to referral Flu yearly  COVID vaccine Pfizdr 01/2020, 03/2020, no booster Pneumovax 2003, 07/2015, prevnar-20 09/2020 Tdap 2018.  Shingrix - discussed.  Advanced directives received, scanned 08/2019. Children Cay Schillings, Domingo Dimes are HCPOA. Ok with temporary trial of intubation, but prolonged life support if terminal condition. 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 Interim medical history since our last visit reviewed. Allergies and medications reviewed and updated. Outpatient Medications Prior to Visit  Medication Sig Dispense Refill   albuterol (PROVENTIL) (2.5 MG/3ML) 0.083% nebulizer solution INHALE 1 VIAL VIA NEBULIZER EVERY 6 HOURS AS NEEDED FOR WHEEZING/SHORTNESS OF BREATH 150 mL 1   albuterol (VENTOLIN HFA) 108 (90 Base) MCG/ACT inhaler Inhale 2 puffs into the lungs every 6 (six) hours as needed for wheezing or shortness of breath. 18 g 3   aspirin EC 81 MG EC tablet Take 1 tablet (81  mg total) by mouth daily. Swallow whole. 30 tablet 0   BREZTRI AEROSPHERE 160-9-4.8 MCG/ACT AERO INHALE 2 PUFFS INTO LUNGS 2 TIMES DAILY 10.7 each 11   Cholecalciferol (VITAMIN D3) 25 MCG (1000 UT) CAPS Take 1 capsule (1,000 Units total) by mouth daily. 30 capsule    dextromethorphan-guaiFENesin (MUCINEX DM) 30-600 MG 12hr tablet Take 1 tablet by mouth 2 (two) times daily as needed for cough.     ENTRESTO 24-26 MG TAKE 1 TABLET BY MOUTH TWICE A DAY 180 tablet 1   fluticasone (FLONASE) 50 MCG/ACT nasal spray PLACE 1 SPRAY INTO BOTH NOSTRILS 2 (TWO) TIMES DAILY 48 mL 3   furosemide (LASIX) 20 MG tablet TAKE 1 TABLET BY MOUTH EVERY DAY 90 tablet 0   JARDIANCE 10 MG TABS tablet TAKE 1 TABLET BY MOUTH EVERY DAY BEFORE BREAKFAST 90 tablet 3   LORazepam (ATIVAN) 0.5 MG tablet TAKE 1/2 TO 1 TABLET BY MOUTH TWICE A DAY AS NEEDED FOR ANXIETY 30 tablet 3   metoprolol succinate (TOPROL-XL) 25 MG 24 hr tablet TAKE 1 TABLET (25 MG TOTAL) BY MOUTH DAILY. 90 tablet 1   Multiple Vitamin (MULTIVITAMIN WITH MINERALS) TABS tablet Take 1 tablet by mouth daily.     nitroGLYCERIN (NITROSTAT) 0.4 MG SL tablet Place 1 tablet (0.4 mg total) under the tongue every 5 (five) minutes as needed. 25 tablet 2   spironolactone (ALDACTONE) 25 MG tablet TAKE 1/2 TABLET BY MOUTH EVERY DAY 45 tablet 3   traMADol (ULTRAM) 50 MG tablet TAKE 1 TABLET BY MOUTH EVERY 8 HOURS AS NEEDED FOR UP TO 5 DAYS FOR MODERATE PAIN 20 tablet 3   ARIPiprazole (ABILIFY) 10 MG tablet TAKE 1 TABLET BY MOUTH EVERY DAY 90 tablet 3   atorvastatin (LIPITOR) 80 MG tablet TAKE 1 TABLET BY MOUTH EVERYDAY AT BEDTIME 90 tablet 0   Dulaglutide (TRULICITY) 0.75 MG/0.5ML SOPN Inject 0.75 mg into the skin once a week. 2 mL 6   escitalopram (LEXAPRO) 20 MG tablet TAKE 1 TABLET BY MOUTH EVERY DAY 90 tablet 0   gabapentin (NEURONTIN) 300 MG capsule TAKE 2 CAPSULES IN THE MORNING AND 3 CAPSULES AT NIGHT 450 capsule 0   nystatin (MYCOSTATIN) 100000 UNIT/ML suspension TAKE 1  TEASPOONFUL BY MOUTH 3 TIMES DAILY AS NEEDED FOR MOUTH SORES. (Patient not taking: Reported on 10/21/2022) 150 mL 1   No facility-administered medications prior to visit.     Per HPI unless specifically indicated in ROS section below Review of Systems  Constitutional:  Negative for activity change, appetite change, chills, fatigue, fever and unexpected weight change.  HENT:  Negative for hearing loss.   Eyes:  Negative for visual disturbance.  Respiratory:  Positive for cough, chest tightness (occ), shortness of breath and wheezing.   Cardiovascular:  Negative for chest pain, palpitations and leg swelling.  Gastrointestinal:  Negative for abdominal distention, abdominal pain,  blood in stool, constipation, diarrhea, nausea and vomiting.  Genitourinary:  Negative for difficulty urinating and hematuria.  Musculoskeletal:  Negative for arthralgias, myalgias and neck pain.  Skin:  Negative for rash.  Neurological:  Negative for dizziness, seizures, syncope and headaches.  Hematological:  Negative for adenopathy. Does not bruise/bleed easily.  Psychiatric/Behavioral:  Negative for dysphoric mood. The patient is not nervous/anxious.     Objective:  BP 134/82   Pulse 70   Temp (!) 97.1 F (36.2 C) (Temporal)   Ht 5' (1.524 m)   Wt 261 lb 8 oz (118.6 kg)   LMP 03/08/2013   SpO2 94% Comment: 2 L  BMI 51.07 kg/m   Wt Readings from Last 3 Encounters:  10/27/22 261 lb 8 oz (118.6 kg)  10/21/22 263 lb (119.3 kg)  10/14/22 263 lb 12.8 oz (119.7 kg)      Physical Exam Vitals and nursing note reviewed.  Constitutional:      Appearance: Normal appearance. She is not ill-appearing.  HENT:     Head: Normocephalic and atraumatic.     Right Ear: Tympanic membrane, ear canal and external ear normal. No decreased hearing noted. There is no impacted cerumen.     Left Ear: Ear canal and external ear normal. No decreased hearing noted. A middle ear effusion is present. There is no impacted cerumen.      Mouth/Throat:     Mouth: Mucous membranes are moist.     Pharynx: Oropharynx is clear. No oropharyngeal exudate or posterior oropharyngeal erythema.  Eyes:     General:        Right eye: No discharge.        Left eye: No discharge.     Extraocular Movements: Extraocular movements intact.     Conjunctiva/sclera: Conjunctivae normal.     Pupils: Pupils are equal, round, and reactive to light.  Neck:     Thyroid: No thyroid mass or thyromegaly.     Vascular: No carotid bruit.  Cardiovascular:     Rate and Rhythm: Normal rate and regular rhythm.     Pulses: Normal pulses.     Heart sounds: Normal heart sounds. No murmur heard. Pulmonary:     Effort: Pulmonary effort is normal. No respiratory distress.     Breath sounds: Wheezing (mild diffuse exp wheezing throughout) present. No rhonchi or rales.  Abdominal:     General: Bowel sounds are normal. There is no distension.     Palpations: Abdomen is soft. There is no mass.     Tenderness: There is no abdominal tenderness. There is no guarding or rebound.     Hernia: No hernia is present.  Musculoskeletal:     Cervical back: Normal range of motion and neck supple. No rigidity.     Right lower leg: No edema.     Left lower leg: No edema.  Lymphadenopathy:     Cervical: No cervical adenopathy.  Skin:    General: Skin is warm and dry.     Findings: No rash.  Neurological:     General: No focal deficit present.     Mental Status: She is alert. Mental status is at baseline.  Psychiatric:        Mood and Affect: Mood normal.        Behavior: Behavior normal.       Results for orders placed or performed in visit on 10/20/22  Hepatic function panel  Result Value Ref Range   Total Bilirubin 0.4 0.2 - 1.2  mg/dL   Bilirubin, Direct 0.1 0.0 - 0.3 mg/dL   Alkaline Phosphatase 124 (H) 39 - 117 U/L   AST 16 0 - 37 U/L   ALT 19 0 - 35 U/L   Total Protein 7.4 6.0 - 8.3 g/dL   Albumin 3.8 3.5 - 5.2 g/dL  Parathyroid hormone, intact (no  Ca)  Result Value Ref Range   PTH 55 16 - 77 pg/mL  Microalbumin / creatinine urine ratio  Result Value Ref Range   Microalb, Ur <0.7 0.0 - 1.9 mg/dL   Creatinine,U 65.7 mg/dL   Microalb Creat Ratio 1.9 0.0 - 30.0 mg/g  VITAMIN D 25 Hydroxy (Vit-D Deficiency, Fractures)  Result Value Ref Range   VITD 36.19 30.00 - 100.00 ng/mL  Phosphorus  Result Value Ref Range   Phosphorus 3.8 2.3 - 4.6 mg/dL  Hemoglobin Q4O  Result Value Ref Range   Hgb A1c MFr Bld 6.4 4.6 - 6.5 %  Lipid panel  Result Value Ref Range   Cholesterol 101 0 - 200 mg/dL   Triglycerides 96.2 0.0 - 149.0 mg/dL   HDL 95.28 >41.32 mg/dL   VLDL 44.0 0.0 - 10.2 mg/dL   LDL Cholesterol 40 0 - 99 mg/dL   Total CHOL/HDL Ratio 2    NonHDL 57.49    Lab Results  Component Value Date   CREATININE 1.14 10/14/2022   BUN 14 10/14/2022   NA 138 10/14/2022   K 4.7 10/14/2022   CL 100 10/14/2022   CO2 26 10/14/2022       10/21/2022   11:35 AM 07/13/2022   12:24 PM 10/15/2021   12:03 PM 10/08/2021   11:55 AM 10/01/2020   12:15 PM  Depression screen PHQ 2/9  Decreased Interest 0 0 1 1 0  Down, Depressed, Hopeless 0 0 0 0 0  PHQ - 2 Score 0 0 1 1 0  Altered sleeping  1 0 1 0  Tired, decreased energy  1 0 1 1  Change in appetite  1 0 0 0  Feeling bad or failure about yourself   0 0 0 0  Trouble concentrating  0 0 0 0  Moving slowly or fidgety/restless  0 0 2 0  Suicidal thoughts  0 0 0 0  PHQ-9 Score  3 1 5 1   Difficult doing work/chores  Not difficult at all Somewhat difficult Not difficult at all        07/13/2022   12:25 PM 10/08/2021   11:55 AM 10/01/2020   12:16 PM 02/23/2020   12:25 PM  GAD 7 : Generalized Anxiety Score  Nervous, Anxious, on Edge 1 3 1 3   Control/stop worrying 1 0 0 3  Worry too much - different things 1 0 0 3  Trouble relaxing 0 1 0 3  Restless 1 3 0 1  Easily annoyed or irritable 0 0 0 3  Afraid - awful might happen 0 1 0 2  Total GAD 7 Score 4 8 1 18   Anxiety Difficulty Not difficult at  all Not difficult at all     Assessment & Plan:   Problem List Items Addressed This Visit     HYPERTRIGLYCERIDEMIA    Chronic, stable on current regimen of high potency statin - continue. The ASCVD Risk score (Arnett DK, et al., 2019) failed to calculate for the following reasons:   The patient has a prior MI or stroke diagnosis       Relevant Medications   atorvastatin (LIPITOR) 80 MG  tablet   Morbid obesity with BMI of 50.0-59.9, adult (HCC)    8lb weight gain over the past 3 months noted.       Relevant Medications   Dulaglutide (TRULICITY) 0.75 MG/0.5ML SOPN   Mood disorder (HCC)    Chronic, stable period on lexapro, abilify, with PRN lorazepam.       Relevant Medications   ARIPiprazole (ABILIFY) 10 MG tablet   escitalopram (LEXAPRO) 20 MG tablet   Ex-smoker    Congratulated on remaining abstinent this past year  Interested in lung cancer screening program - will refer      Relevant Orders   Ambulatory Referral for Lung Cancer Scre   HTN (hypertension)    Chronic, stable continue current regimen of Toprol XL entresto lasix and spironolactone.       Relevant Medications   atorvastatin (LIPITOR) 80 MG tablet   OSA on CPAP    On CPAP with oxygen through pulm.       Health maintenance examination - Primary (Chronic)    Preventative protocols reviewed and updated unless pt declined. Discussed healthy diet and lifestyle.       Type 2 diabetes mellitus (HCC)    Chronic, stable period on current regimen - continue.       Relevant Medications   atorvastatin (LIPITOR) 80 MG tablet   Dulaglutide (TRULICITY) 0.75 MG/0.5ML SOPN   Chronic respiratory failure with hypoxia (HCC)   Cor pulmonale (chronic) (HCC)   Relevant Medications   atorvastatin (LIPITOR) 80 MG tablet   COPD GOLD 2    Appreciate pulm care. Continues Breztri with albuterol and supplemental oxygen. Some wheezing heard today but seems at baseline      Relevant Medications   cetirizine (ZYRTEC) 10  MG tablet   Chronic kidney disease, stage 3a (HCC)    Kidney function remains stable with GFR 50.       TIA (transient ischemic attack)    Continue aspirin, statin.       Relevant Medications   atorvastatin (LIPITOR) 80 MG tablet   Chronic combined systolic and diastolic CHF (congestive heart failure) (HCC)    Appreciate cardiology care continues entresto, spironolactone, lasix Also on trulicity and jardiance.       Relevant Medications   atorvastatin (LIPITOR) 80 MG tablet   CAD (coronary artery disease)    Continue aspirin, statin.       Relevant Medications   atorvastatin (LIPITOR) 80 MG tablet   Vitamin D deficiency    Continues vit D 1000 IU daily.       Chronic low back pain    Refill gabapentin.       Relevant Medications   escitalopram (LEXAPRO) 20 MG tablet   gabapentin (NEURONTIN) 300 MG capsule   Aortic atherosclerosis (HCC)    Continue aspirin, statin.       Relevant Medications   atorvastatin (LIPITOR) 80 MG tablet   NAFLD (nonalcoholic fatty liver disease)    Severe by CT 2019. Mild persistent transaminitis - discussed with pt. Update liver ultrasound. Low risk Fib4 score:  Fibrosis 4 Score = .88 (Low risk)      Interpretation for patients with NAFLD          <1.30       -  F0-F1 (Low risk)          1.30-2.67 -  Indeterminate           >2.67      -  F3-F4 (High risk)  Validated for ages 65-65       Relevant Orders   US ABDOMEN LIMITED RUQ (LIVER/GB)   Sinus congestion    Ongoing congestion present for weeks associated with left ear effusion and left eye redness, swelling, discharge which pt states is present for 3 months. Will Rx augmentin 7d course to cover possible bacterial sinusitis/conjunctivitis component to chronic sinus congestion.       Other Visit Diagnoses     Special screening for malignant neoplasms, colon       Relevant Orders   Cologuard        Meds ordered this encounter  Medications   ARIPiprazole (ABILIFY) 10 MG  tablet    Sig: Take 1 tablet (10 mg total) by mouth daily.    Dispense:  90 tablet    Refill:  4   atorvastatin (LIPITOR) 80 MG tablet    Sig: TAKE 1 TABLET BY MOUTH EVERYDAY AT BEDTIME For cholesterol    Dispense:  90 tablet    Refill:  4   Dulaglutide (TRULICITY) 0.75 MG/0.5ML SOPN    Sig: Inject 0.75 mg into the skin once a week.    Dispense:  6 mL    Refill:  4   escitalopram (LEXAPRO) 20 MG tablet    Sig: Take 1 tablet (20 mg total) by mouth daily.    Dispense:  90 tablet    Refill:  4   gabapentin (NEURONTIN) 300 MG capsule    Sig: Take 2 capsules (600 mg total) by mouth in the morning AND 3 capsules (900 mg total) at bedtime.    Dispense:  450 capsule    Refill:  4   cetirizine (ZYRTEC) 10 MG tablet    Sig: Take 1 tablet (10 mg total) by mouth daily.   amoxicillin-clavulanate (AUGMENTIN) 875-125 MG tablet    Sig: Take 1 tablet by mouth 2 (two) times daily for 7 days.    Dispense:  14 tablet    Refill:  0    Orders Placed This Encounter  Procedures   US ABDOMEN LIMITED RUQ (LIVER/GB)    Standing Status:   Future    Standing Expiration Date:   10/27/2023    Order Specific Question:   Reason for Exam (SYMPTOM  OR DIAGNOSIS REQUIRED)    Answer:   transaminitis, h/o NAFLD on prior imaging    Order Specific Question:   Preferred imaging location?    Answer:   [id number] [facility]   Cologuard   Ambulatory Referral for Lung Cancer Scre    Referral Priority:   Routine    Referral Type:   Consultation    Referral Reason:   Specialty Services Required    Number of Visits Requested:   1    Patient Instructions  We will sign you up for cologuard - expect a kit in the mail.  We will plan for final pap in 2025.  We will refer you to lung cancer screening program. If interested, check with pharmacy about new 2 shot shingles series (shingrix).  We will call you for liver ultrasound.  May take augmentin 1 week course to cover possible sinus infection.  Return in 6 months for  diabetes follow up visit  Follow up plan: Return in about 6 months (around 04/29/2023) for follow up visit.  [name], MD

## 2022-10-27 NOTE — Assessment & Plan Note (Signed)
Continues vit D 1000 IU daily.  

## 2022-10-27 NOTE — Assessment & Plan Note (Addendum)
Appreciate cardiology care continues entresto, spironolactone, lasix Also on trulicity and jardiance.

## 2022-10-27 NOTE — Assessment & Plan Note (Signed)
Preventative protocols reviewed and updated unless pt declined. Discussed healthy diet and lifestyle.  

## 2022-10-27 NOTE — Assessment & Plan Note (Signed)
8lb weight gain over the past 3 months noted.

## 2022-10-27 NOTE — Assessment & Plan Note (Signed)
Refill gabapentin 

## 2022-10-27 NOTE — Assessment & Plan Note (Signed)
Congratulated on remaining abstinent this past year  Interested in lung cancer screening program - will refer

## 2022-10-27 NOTE — Assessment & Plan Note (Signed)
Chronic, stable continue current regimen of Toprol XL entresto lasix and spironolactone.

## 2022-10-27 NOTE — Patient Instructions (Addendum)
We will sign you up for cologuard - expect a kit in the mail.  We will plan for final pap in 2025.  We will refer you to lung cancer screening program. If interested, check with pharmacy about new 2 shot shingles series (shingrix).  We will call you for liver ultrasound.  May take augmentin 1 week course to cover possible sinus infection.  Return in 6 months for diabetes follow up visit

## 2022-10-27 NOTE — Assessment & Plan Note (Signed)
Chronic, stable on current regimen of high potency statin - continue. The ASCVD Risk score (Arnett DK, et al., 2019) failed to calculate for the following reasons:   The patient has a prior MI or stroke diagnosis

## 2022-10-30 NOTE — Progress Notes (Signed)
Carelink Summary Report / Loop Recorder 

## 2022-11-02 ENCOUNTER — Ambulatory Visit (INDEPENDENT_AMBULATORY_CARE_PROVIDER_SITE_OTHER): Payer: 59

## 2022-11-02 DIAGNOSIS — G459 Transient cerebral ischemic attack, unspecified: Secondary | ICD-10-CM | POA: Diagnosis not present

## 2022-11-03 LAB — CUP PACEART REMOTE DEVICE CHECK
Date Time Interrogation Session: 20240602231114
Implantable Pulse Generator Implant Date: 20211102

## 2022-11-04 DIAGNOSIS — Z1211 Encounter for screening for malignant neoplasm of colon: Secondary | ICD-10-CM | POA: Diagnosis not present

## 2022-11-06 DIAGNOSIS — G4733 Obstructive sleep apnea (adult) (pediatric): Secondary | ICD-10-CM | POA: Diagnosis not present

## 2022-11-11 LAB — COLOGUARD: COLOGUARD: NEGATIVE

## 2022-11-24 ENCOUNTER — Ambulatory Visit
Admission: RE | Admit: 2022-11-24 | Discharge: 2022-11-24 | Disposition: A | Discharge status: Home / Self Care | Payer: 59 | Source: Ambulatory Visit | Attending: Family Medicine | Admitting: Family Medicine

## 2022-11-24 DIAGNOSIS — Z1231 Encounter for screening mammogram for malignant neoplasm of breast: Secondary | ICD-10-CM

## 2022-11-25 ENCOUNTER — Other Ambulatory Visit: Payer: 59

## 2022-11-25 NOTE — Progress Notes (Signed)
Carelink Summary Report / Loop Recorder 

## 2022-12-04 NOTE — Progress Notes (Unsigned)
Cardiology Office Note:   Date:  12/04/2022  NAME:  Brenda [name]    MRN: [id number] DOB:  1961-05-18   PCP:  [name], MD  Cardiologist:  Reatha Harps, MD  Electrophysiologist:  None   Referring MD: [name], MD   No chief complaint on file.   History of Present Illness:   Brenda [name] is a 62 y.o. female with below history who presents for follow-up.   ***  Problem List 1. NSTEMI 01/30/2020 -stress induced CM 2. Systolic HF -EF 30-35% with apical akinesis 01/31/2020 in setting of COPD/hypercarbic respiratory failure -> Takotsubo CM -EF 40-45% with improvement in WMA 02/08/2020 -EF 55-60% 06/28/2020 -EF 45-50% 11/02/2020 (Inferior STEMI) -EF 60-65% 05/09/2021 3. STEMI 11/02/2020 -PCI to dRCA 4. COPD -severe COPD -2L 5. Tobacco abuse 6. HTN 7. DM -A1c 6.5 8. HLD -T chol 100, HDL 42, LDL 36, triglycerides 112 9. CVA 01/2020/Cryptogenic -basilar artery stenosis? -TEE negative -loop recorder placed 10. SVT  Past Medical History: Past Medical History:  Diagnosis Date   Acute pancreatitis    Asthma    main dyspnea thought due to deconditioning (10/2013)   Bipolar I disorder, most recent episode (or current) manic, unspecified    pt denies this   Bronchitis, chronic obstructive 2008 & 2009   FeV1 64% TLC 105% DLCO 55% 2008  -  FeV1 81% FeF 25-75 43% 2009   CAD (coronary artery disease)    inferior STEMI in 10/2020 s/p DES to RCA   Chronic combined systolic and diastolic CHF (congestive heart failure) (HCC)    LVEF 30-35% in 01/2020 but improved to 60-65% in 05/2021   COPD (chronic obstructive pulmonary disease) (HCC)    emphysema, not an issue per pulm (10/2013)   History of pyelonephritis 05/2012   History of ST elevation myocardial infarction (STEMI)    HTN (hypertension)    Hx of migraines    Hyperlipidemia    Knee pain, right    Lumbar disc disease with radiculopathy    multilevel spondylitic changes, scoliosis, and anterolisthesis L4 not thought  to currently be surg candidate (Dr. Phoebe Perch, [facility]) (Dr. Ollen Bowl, [facility])   Nicotine addiction    Obesity    RSV (respiratory syncytial virus pneumonia) 01/2020   hospitalization   Suicide attempt (HCC) 06/2001   Swelling of limb    TIA (transient ischemic attack)    Type 2 diabetes mellitus (HCC)    Urge and stress incontinence 07/2012   ([facility])    Past Surgical History: Past Surgical History:  Procedure Laterality Date   BUBBLE STUDY  04/02/2020   Procedure: BUBBLE STUDY;  Surgeon: Vesta Mixer, MD;  Location: [facility] ENDOSCOPY;  Service: Cardiovascular;;   CHOLECYSTECTOMY  07/1982   CORONARY/GRAFT ACUTE MI REVASCULARIZATION N/A 11/02/2020   Procedure: Coronary/Graft Acute MI Revascularization;  Surgeon: Yvonne Kendall, MD;  Location: [facility] INVASIVE CV LAB;  Service: Cardiovascular;  Laterality: N/A;   CT MAXILLOFACIAL WO/W CM  06/14/06   Left max mucocele   CT of chest  06/14/2006   Normal except c/w active inflammation / infection.  Left renal atrophy   ERCP / sphincterotomy - stenosis  01/15/06   IR ANGIO INTRA EXTRACRAN SEL COM CAROTID INNOMINATE BILAT MOD SED  02/09/2020   IR ANGIO VERTEBRAL SEL SUBCLAVIAN INNOMINATE UNI L MOD SED  02/09/2020   IR ANGIO VERTEBRAL SEL VERTEBRAL UNI R MOD SED  02/09/2020   IR ANGIO VERTEBRAL SEL VERTEBRAL UNI R MOD SED  02/13/2020   IR  US GUIDE VASC ACCESS RIGHT  02/09/2020   IR US GUIDE VASC ACCESS RIGHT  02/13/2020   LEFT HEART CATH AND CORONARY ANGIOGRAPHY N/A 11/02/2020   Procedure: LEFT HEART CATH AND CORONARY ANGIOGRAPHY;  Surgeon: Yvonne Kendall, MD;  Location: [facility] INVASIVE CV LAB;  Service: Cardiovascular;  Laterality: N/A;   LOOP RECORDER INSERTION N/A 04/02/2020   Procedure: LOOP RECORDER INSERTION;  Surgeon: Marinus Maw, MD;  Location: [facility] INVASIVE CV LAB;  Service: Cardiovascular;  Laterality: N/A;   lumbar MRI  11/2010   multilevel spondylitic changes with upper lumbar scoliosis and anterolisthesis of L4 on 5 with some L foraminal  narrowing esp at L/3 with concavity of scoliosis, some central stenosis and biforaminal narrowing at L4/5 with spondylolisthesis   [place] - Pneumonia, acute asthma, left maxillary sinusitis  01/11 - 06/18/2006   RADIOLOGY WITH ANESTHESIA N/A 02/13/2020   Procedure: Carotid Stent;  Surgeon: Julieanne Cotton, MD;  Location: [facility] OR;  Service: Radiology;  Laterality: N/A;   Retained stone     ERCP secondary to retained stone   Right knee surgery  1984,1989,1989,1991,1994   Reconstructions for ACL insuff   TEE WITHOUT CARDIOVERSION N/A 04/02/2020   Procedure: TRANSESOPHAGEAL ECHOCARDIOGRAM (TEE);  Surgeon: Elease Hashimoto Deloris Ping, MD;  Location: [facility] ENDOSCOPY;  Service: Cardiovascular;  Laterality: N/A;   TUBAL LIGATION      Current Medications: No outpatient medications have been marked as taking for the 12/09/22 encounter (Appointment) with O'Neal, Ronnald Ramp, MD.     Allergies:    Metolazone   Social History: Social History   Socioeconomic History   Marital status: Widowed    Spouse name: Not on file   Number of children: 3   Years of education: Not on file   Highest education level: Not on file  Occupational History   Occupation: Geographical information systems officer    Employer: [employer]     Employer: [employer]   Tobacco Use   Smoking status: Former    Packs/day: 0.25    Years: 41.00    Additional pack years: 0.00    Total pack years: 10.25    Types: Cigarettes    Quit date: 10/01/2021    Years since quitting: 1.1    Passive exposure: Current   Smokeless tobacco: Never  Vaping Use   Vaping Use: Never used  Substance and Sexual Activity   Alcohol use: Yes    Alcohol/week: 0.0 standard drinks of alcohol    Comment: occasional   Drug use: Not Currently    Types: Marijuana   Sexual activity: Not on file  Other Topics Concern   Not on file  Social History Narrative   Lives with husband and son and daughter in law and grandchild   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   Activity: limited by dyspnea   Diet: good water, fruits/vegetables daily   Social Determinants of Health   Financial Resource Strain: Low Risk  (10/21/2022)   Overall Financial Resource Strain (CARDIA)    Difficulty of Paying Living Expenses: Not hard at all  Food Insecurity: No Food Insecurity (10/21/2022)   Hunger Vital Sign    Worried About Running Out of Food in the Last Year: Never true    Ran Out of Food in the Last Year: Never true  Transportation Needs: No Transportation Needs (10/21/2022)   PRAPARE - Administrator, Civil Service (Medical): No    Lack of Transportation (Non-Medical): No  Physical Activity: Insufficiently Active (10/21/2022)  Exercise Vital Sign    Days of Exercise per Week: 7 days    Minutes of Exercise per Session: 20 min  Stress: No Stress Concern Present (10/21/2022)   [employer] of Occupational Health - Occupational Stress Questionnaire    Feeling of Stress : Not at all  Social Connections: Socially Isolated (10/21/2022)   Social Connection and Isolation Panel [NHANES]    Frequency of Communication with Friends and Family: More than three times a week    Frequency of Social Gatherings with Friends and Family: More than three times a week    Attends Religious Services: Never    Database administrator or Organizations: No    Attends Banker Meetings: Never    Marital Status: Widowed     Family History: The patient's family history includes Asthma in her father; Breast cancer in her paternal aunt and paternal grandmother; Heart disease in her brother and mother; Hyperlipidemia in her mother; Hypertension in her father and mother.  ROS:   All other ROS reviewed and negative. Pertinent positives noted in the HPI.     EKGs/Labs/Other Studies Reviewed:   The following studies were personally reviewed by me today:  EKG:  EKG is *** ordered today.          Recent Labs: 10/14/2022: BUN 14; Creatinine,  Ser 1.14; Hemoglobin 15.4; Platelets 266.0; Potassium 4.7; Pro B Natriuretic peptide (BNP) 19.0; Sodium 138; TSH 1.31 10/20/2022: ALT 19   Recent Lipid Panel    Component Value Date/Time   CHOL 101 10/20/2022 0821   TRIG 85.0 10/20/2022 0821   HDL 43.70 10/20/2022 0821   CHOLHDL 2 10/20/2022 0821   VLDL 17.0 10/20/2022 0821   LDLCALC 40 10/20/2022 0821    Physical Exam:   VS:  LMP 03/08/2013    Wt Readings from Last 3 Encounters:  10/27/22 261 lb 8 oz (118.6 kg)  10/21/22 263 lb (119.3 kg)  10/14/22 263 lb 12.8 oz (119.7 kg)    General: Well nourished, well developed, in no acute distress Head: Atraumatic, normal size  Eyes: PEERLA, EOMI  Neck: Supple, no JVD Endocrine: No thryomegaly Cardiac: Normal S1, S2; RRR; no murmurs, rubs, or gallops Lungs: Clear to auscultation bilaterally, no wheezing, rhonchi or rales  Abd: Soft, nontender, no hepatomegaly  Ext: No edema, pulses 2+ Musculoskeletal: No deformities, BUE and BLE strength normal and equal Skin: Warm and dry, no rashes   Neuro: Alert and oriented to person, place, time, and situation, CNII-XII grossly intact, no focal deficits  Psych: Normal mood and affect   ASSESSMENT:   Brenda [name] is a 62 y.o. female who presents for the following: No diagnosis found.  PLAN:   There are no diagnoses linked to this encounter.  {Are you ordering a CV Procedure (e.g. stress test, cath, DCCV, TEE, etc)?   Press F2        :[id number]}  Disposition: No follow-ups on file.  Medication Adjustments/Labs and Tests Ordered: Current medicines are reviewed at length with the patient today.  Concerns regarding medicines are outlined above.  No orders of the defined types were placed in this encounter.  No orders of the defined types were placed in this encounter.  There are no Patient Instructions on file for this visit.   Time Spent with Patient: I have spent a total of *** minutes with patient reviewing hospital notes, telemetry,  EKGs, labs and examining the patient as well as establishing an assessment and plan that was discussed  with the patient.  > 50% of time was spent in direct patient care.  Signed, Lenna Gilford. Flora Lipps, MD, [facility]  [facility]  [address], [address] [place], [place] [address] [phone]  12/04/2022 12:25 PM

## 2022-12-06 DIAGNOSIS — G4733 Obstructive sleep apnea (adult) (pediatric): Secondary | ICD-10-CM | POA: Diagnosis not present

## 2022-12-07 ENCOUNTER — Ambulatory Visit (INDEPENDENT_AMBULATORY_CARE_PROVIDER_SITE_OTHER): Payer: 59

## 2022-12-07 DIAGNOSIS — G459 Transient cerebral ischemic attack, unspecified: Secondary | ICD-10-CM

## 2022-12-07 LAB — CUP PACEART REMOTE DEVICE CHECK
Date Time Interrogation Session: 20240705230457
Implantable Pulse Generator Implant Date: 20211102

## 2022-12-09 ENCOUNTER — Other Ambulatory Visit: Payer: Self-pay | Admitting: Family Medicine

## 2022-12-09 ENCOUNTER — Encounter: Payer: Self-pay | Admitting: Cardiovascular Disease

## 2022-12-09 ENCOUNTER — Ambulatory Visit: Payer: 59 | Attending: Cardiovascular Disease | Admitting: Cardiovascular Disease

## 2022-12-09 VITALS — BP 130/76 | HR 72 | Ht 60.0 in | Wt 262.2 lb

## 2022-12-09 DIAGNOSIS — I10 Essential (primary) hypertension: Secondary | ICD-10-CM

## 2022-12-09 DIAGNOSIS — F39 Unspecified mood [affective] disorder: Secondary | ICD-10-CM

## 2022-12-09 DIAGNOSIS — I2510 Atherosclerotic heart disease of native coronary artery without angina pectoris: Secondary | ICD-10-CM | POA: Diagnosis not present

## 2022-12-09 DIAGNOSIS — I5022 Chronic systolic (congestive) heart failure: Secondary | ICD-10-CM | POA: Diagnosis not present

## 2022-12-09 DIAGNOSIS — E782 Mixed hyperlipidemia: Secondary | ICD-10-CM | POA: Diagnosis not present

## 2022-12-09 DIAGNOSIS — M1712 Unilateral primary osteoarthritis, left knee: Secondary | ICD-10-CM

## 2022-12-09 MED ORDER — SPIRONOLACTONE 25 MG PO TABS: 25.0000 mg | ORAL_TABLET | Freq: Every day | ORAL | 3 refills | Status: DC

## 2022-12-09 MED ORDER — METOPROLOL SUCCINATE ER 25 MG PO TB24: 25.0000 mg | ORAL_TABLET | Freq: Every day | ORAL | 3 refills | Status: DC

## 2022-12-09 MED ORDER — ENTRESTO 24-26 MG PO TABS: 1.0000 | ORAL_TABLET | Freq: Two times a day (BID) | ORAL | 3 refills | Status: DC

## 2022-12-09 NOTE — Patient Instructions (Signed)
Medication Instructions:  INCREASE spironolactone to 25mg  (whole tab) daily  OK to take extra lasix in afternoon if needed  CONTINUE all other current medications   *If you need a refill on your cardiac medications before your next appointment, please call your pharmacy*     Follow-Up: At [facility], you and your health needs are our priority.  As part of our continuing mission to provide you with exceptional heart care, we have created designated Provider Care Teams.  These Care Teams include your primary Cardiologist (physician) and Advanced Practice Providers (APPs -  Physician Assistants and Nurse Practitioners) who all work together to provide you with the care you need, when you need it.  We recommend signing up for the patient portal called "MyChart".  Sign up information is provided on this After Visit Summary.  MyChart is used to connect with patients for Virtual Visits (Telemedicine).  Patients are able to view lab/test results, encounter notes, upcoming appointments, etc.  Non-urgent messages can be sent to your provider as well.   To learn more about what you can do with MyChart, go to [email or web address].    Your next appointment:    12 months with Dr. Flora Lipps

## 2022-12-10 ENCOUNTER — Ambulatory Visit
Admission: RE | Admit: 2022-12-10 | Discharge: 2022-12-10 | Disposition: A | Discharge status: Home / Self Care | Payer: 59 | Source: Ambulatory Visit | Attending: Family Medicine | Admitting: Family Medicine

## 2022-12-10 DIAGNOSIS — R7401 Elevation of levels of liver transaminase levels: Secondary | ICD-10-CM | POA: Diagnosis not present

## 2022-12-10 DIAGNOSIS — K760 Fatty (change of) liver, not elsewhere classified: Secondary | ICD-10-CM

## 2022-12-10 NOTE — Progress Notes (Unsigned)
Subjective:   Patient ID: Brenda [name], female    DOB: [birth year]    MRN: [id number]   Brief patient profile:  62  yowf  MM/ Quit smoking  09/2021 wt  07/2015 = 250 and admitted with" aecopd" but with pfts nearly nl 2/29/15 p rx so was characterized as GOLD 0/ AB  But by 09/2017 met criteria for GOLD II    History of Present Illness  10/20/2017  f/u ov/Brenda [name] re:  AB/  still smoking  And now meets criteria for GOLD II maint on dulera/spiriva  Chief Complaint  Patient presents with   Follow-up    Pt had full PFT prior to OV today. Pt has SOB with exertion, productive cough-yellow, wheezing, some chest tightness. Pt is on 2 liters of O2 con't.  Dyspnea:  Food lion struggle on 3lpm  Cough: better Sleep: cpap/ 30 degrees SABA use:  Needing less  rec No change  rx   01/19/2018  f/u ov/Brenda [name] re: GOLD II and still smoking/ maint on symb/spiriva/ 02 2-3lpm and using med calendar well  Chief Complaint  Patient presents with   Follow-up    pt c/o worsening sob worse with warm weather.     Dyspnea:  MMRC3 = can't walk 100 yards even at a slow pace at a flat grade s stopping due to sob even on 3lpm  Cough: some worse since hot weather . 4-5 days  Sleeping: 30 degrees/cpap 3lpm SABA WUJ:WJXB daily though hfa suboptimal/ short ti rec For cough add gabapentin 300 mg at lunch and supper     09/15/2019  f/u ov/Brenda [name] re: COPD2-3 still smoking acute flare on breztri 2 bid / not using med calendar/ action plan prev provided  Chief Complaint  Patient presents with   Follow-up    Breathing worse over the past 10 days. She is coughing up yellow sputum and has has some yellow nasal d/c. She also c/o wheezing. She is using her proair about every 4 hours and neb at least 2 x per day.   Dyspnea:  MMRC3 = can't walk 100 yards even at a slow pace at a flat grade s stopping due to sob   Cough: x 10 days esp in am more prod assoc with same pnds/nasal discharge Sleeping: cpap/ 45 degrees  SABA use: hfa and  neb  02: 2lpm into cpap, 2lpm adjusts to how she feels  Rec zpak  Prednisone 10 mg take  4 each am x 2 days,   2 each am x 2 days,  1 each am x 2 days and stop  I emphasized that nasal steroids (flonase) have no immediate benefit For severe cough not responding to mucinex dm change to promethazine dm  One tsp every 4 hours  The key is to stop smoking completely before smoking completely stops you!   10/14/2022  re-establish  ov/Brenda [name] re: copd GOLD 2 AB/ 02 2lpm 24/7    maint on breztri but hfa suboptimal and on entresto with wt gain since stopped smoking 1 y prior to OV    Chief Complaint  Patient presents with   Consult    Wheezing and SOB x 2 weeks.  Dyspnea:  MMRC3 = can't walk 100 yards even at a slow pace at a flat grade s stopping due to sob   Cough: clear/ worse in am / nasal congestion x sev weeks with itching/sneezing as in the past  Sleeping: 45 degrees no cpap x one year and more freq awakening  with cough/ wheeze/ ? Since enteresto felt strangling on facemask and could not tolerate any longer  SABA use:  02: 2lpm  Rec Make sure you check your oxygen saturation  AT  your highest level of activity (not after you stop)   to be sure it stays over 90% and adjust  02 flow upward to maintain this level if needed but remember to turn it back to previous settings when you stop (to conserve your supply). Zyrtec  or Zyrtec D  best for nasal symptoms Depomedrol 120 IM    Please schedule a follow up office visit in 6 weeks, call sooner if needed with all respiratory/nasal medications /inhalers/ solutions in hand      12/11/2022  f/u ov/Brenda [name] re: GOLD 2/ AB  maint on Breztri  did not  bring meds /not on zyrtec  not d, not Librarian, academic Complaint  Patient presents with   Follow-up    States wheezing at times, sob with rest and exertion, cough with yellow/light green phlegm   Dyspnea:  room to room / ok at rest Cough: worse x one week with purulent sputum  Sleeping: on side with  adjustable bed 30 degrees/  SABA use: hfa not helping so moved on to nebulizer, none on day of ov  02:  2lpm  24/7      No obvious day to day or daytime variability or assoc excess/ purulent sputum or mucus plugs or hemoptysis or cp or chest tightness, subjective wheeze or overt sinus or hb symptoms.   *** without nocturnal  or early am exacerbation  of respiratory  c/o's or need for noct saba. Also denies any obvious fluctuation of symptoms with weather or environmental changes or other aggravating or alleviating factors except as outlined above   No unusual exposure hx or h/o childhood pna/ asthma or knowledge of premature birth.  Current Allergies, Complete Past Medical History, Past Surgical History, Family History, and Social History were reviewed in [employer] record.  ROS  The following are not active complaints unless bolded Hoarseness, sore throat, dysphagia, dental problems, itching, sneezing,  nasal congestion or discharge of excess mucus or purulent secretions, ear ache,   fever, chills, sweats, unintended wt loss or wt gain, classically pleuritic or exertional cp,  orthopnea pnd or arm/hand swelling  or leg swelling, presyncope, palpitations, abdominal pain, anorexia, nausea, vomiting, diarrhea  or change in bowel habits or change in bladder habits, change in stools or change in urine, dysuria, hematuria,  rash, arthralgias, visual complaints, headache, numbness, weakness or ataxia or problems with walking or coordination,  change in mood or  memory.        Current Meds  Medication Sig   albuterol (PROVENTIL) (2.5 MG/3ML) 0.083% nebulizer solution INHALE 1 VIAL VIA NEBULIZER EVERY 6 HOURS AS NEEDED FOR WHEEZING/SHORTNESS OF BREATH   albuterol (VENTOLIN HFA) 108 (90 Base) MCG/ACT inhaler Inhale 2 puffs into the lungs every 6 (six) hours as needed for wheezing or shortness of breath.   ARIPiprazole (ABILIFY) 10 MG tablet Take 1 tablet (10 mg total) by mouth  daily.   aspirin EC 81 MG EC tablet Take 1 tablet (81 mg total) by mouth daily. Swallow whole.   atorvastatin (LIPITOR) 80 MG tablet TAKE 1 TABLET BY MOUTH EVERYDAY AT BEDTIME For cholesterol   BREZTRI AEROSPHERE 160-9-4.8 MCG/ACT AERO INHALE 2 PUFFS INTO LUNGS 2 TIMES DAILY   cetirizine (ZYRTEC) 10 MG tablet Take 1 tablet (10 mg total) by mouth daily.  Cholecalciferol (VITAMIN D3) 25 MCG (1000 UT) CAPS Take 1 capsule (1,000 Units total) by mouth daily.   dextromethorphan-guaiFENesin (MUCINEX DM) 30-600 MG 12hr tablet Take 1 tablet by mouth 2 (two) times daily as needed for cough.   Dulaglutide (TRULICITY) 0.75 MG/0.5ML SOPN Inject 0.75 mg into the skin once a week.   escitalopram (LEXAPRO) 20 MG tablet Take 1 tablet (20 mg total) by mouth daily.   fluticasone (FLONASE) 50 MCG/ACT nasal spray PLACE 1 SPRAY INTO BOTH NOSTRILS 2 (TWO) TIMES DAILY   furosemide (LASIX) 20 MG tablet TAKE 1 TABLET BY MOUTH EVERY DAY   gabapentin (NEURONTIN) 300 MG capsule Take 2 capsules (600 mg total) by mouth in the morning AND 3 capsules (900 mg total) at bedtime.   JARDIANCE 10 MG TABS tablet TAKE 1 TABLET BY MOUTH EVERY DAY BEFORE BREAKFAST   LORazepam (ATIVAN) 0.5 MG tablet TAKE 1/2 TO 1 TABLET BY MOUTH TWICE A DAY AS NEEDED FOR ANXIETY   metoprolol succinate (TOPROL-XL) 25 MG 24 hr tablet Take 1 tablet (25 mg total) by mouth daily.   Multiple Vitamin (MULTIVITAMIN WITH MINERALS) TABS tablet Take 1 tablet by mouth daily.   nitroGLYCERIN (NITROSTAT) 0.4 MG SL tablet Place 1 tablet (0.4 mg total) under the tongue every 5 (five) minutes as needed.   sacubitril-valsartan (ENTRESTO) 24-26 MG Take 1 tablet by mouth 2 (two) times daily.   spironolactone (ALDACTONE) 25 MG tablet Take 1 tablet (25 mg total) by mouth daily.   traMADol (ULTRAM) 50 MG tablet TAKE 1 TABLET BY MOUTH EVERY 8 HOURS AS NEEDED FOR UP TO 5 DAYS FOR MODERATE PAIN                    Objective:   Physical Exam   Wts  12/11/2022      ***   10/14/2022      263 09/15/2019      221  05/30/2019    225  10/06/2013         288 > 11/17/2013  288> 07/30/2015  266  > 08/12/2017   284 > 09/16/2017  278 > 01/19/2018   270 > . 06/20/2018   262 > 08/01/2018   252 > 02/08/2019     239    09/22/13 293 lb 12.8 oz (133.267 kg)  09/18/13 289 lb 4 oz (131.203 kg)  08/21/13 283 lb 8 oz (128.595 kg)      Vital signs reviewed  12/11/2022  - Note at rest 02 sats  ***% on ***   General appearance:    obese amb bf prominent pw      Min barr ***   trace edema R > L                Assessment & Plan:

## 2022-12-11 ENCOUNTER — Encounter: Payer: Self-pay | Admitting: Internal Medicine

## 2022-12-11 ENCOUNTER — Telehealth: Payer: Self-pay

## 2022-12-11 ENCOUNTER — Ambulatory Visit (INDEPENDENT_AMBULATORY_CARE_PROVIDER_SITE_OTHER): Payer: 59 | Admitting: Internal Medicine

## 2022-12-11 VITALS — BP 130/62 | HR 72 | Temp 98.0°F | Ht 60.0 in | Wt 263.0 lb

## 2022-12-11 DIAGNOSIS — J4530 Mild persistent asthma, uncomplicated: Secondary | ICD-10-CM

## 2022-12-11 DIAGNOSIS — J9611 Chronic respiratory failure with hypoxia: Secondary | ICD-10-CM | POA: Diagnosis not present

## 2022-12-11 DIAGNOSIS — J449 Chronic obstructive pulmonary disease, unspecified: Secondary | ICD-10-CM

## 2022-12-11 DIAGNOSIS — Z6841 Body Mass Index (BMI) 40.0 and over, adult: Secondary | ICD-10-CM

## 2022-12-11 MED ORDER — AMOXICILLIN-POT CLAVULANATE 875-125 MG PO TABS: 1.0000 | ORAL_TABLET | Freq: Two times a day (BID) | ORAL | 0 refills | Status: DC

## 2022-12-11 MED ORDER — BREZTRI AEROSPHERE 160-9-4.8 MCG/ACT IN AERO: 2.0000 | INHALATION_SPRAY | Freq: Two times a day (BID) | RESPIRATORY_TRACT | Status: DC

## 2022-12-11 MED ORDER — PREDNISONE 10 MG PO TABS: ORAL_TABLET | ORAL | 0 refills | Status: DC

## 2022-12-11 MED ORDER — FAMOTIDINE 20 MG PO TABS: ORAL_TABLET | ORAL | 11 refills | Status: DC

## 2022-12-11 MED ORDER — PANTOPRAZOLE SODIUM 40 MG PO TBEC: 40.0000 mg | DELAYED_RELEASE_TABLET | Freq: Every day | ORAL | 2 refills | Status: DC

## 2022-12-11 NOTE — Telephone Encounter (Signed)
ERx 

## 2022-12-11 NOTE — Telephone Encounter (Signed)
Received faxed order form from Arneta Cliche of [employer] for continence care supplies.   Spoke with pt to ask if she is aware of the order. Pt confirms she is aware.   Placed form in Dr. Timoteo Expose box.

## 2022-12-11 NOTE — Telephone Encounter (Signed)
Name of Medication:  Lorazepam, Tramadol  Name of Pharmacy:  [facility] Last Fill or Written Date and Quantity:  11/01/22      Lorazepam- #30      Tramadol- #20 Last Office Visit and Type:  10/27/22, CPE Next Office Visit and Type:  04/27/23, 6 mo DM f/u Last Controlled Substance Agreement Date:  none Last UDS:  none

## 2022-12-11 NOTE — Patient Instructions (Addendum)
Zyrtec D  - you have to sign for it and show your drivers license - if still coughing you can add tramadol up to every 4 hours   Augmentin 875 mg take one pill twice daily  X 10 days - take at breakfast and supper with large glass of water.  It would help reduce the usual side effects (diarrhea and yeast infections) if you ate cultured yogurt at lunch.   Prednisone 10 mg take  4 each am x 2 days,   2 each am x 2 days,  1 each am x 2 days and stop   Pantoprazole (protonix) 40 mg   Take  30-60 min before first meal of the day and Pepcid (famotidine)  20 mg after supper until return to office - this is the best way to tell whether stomach acid is contributing to your problem.    Make sure you check your oxygen saturation  AT  your highest level of activity (not after you stop)   to be sure it stays over 90% and adjust  02 flow upward to maintain this level if needed but remember to turn it back to previous settings when you stop (to conserve your supply).    Work on inhaler technique:  relax and gently blow all the way out then take a nice smooth full deep breath back in, triggering the inhaler at same time you start breathing in.  Hold breath in for at least  5 seconds if you can. Blow out breztri out  thru nose. Rinse and gargle with water when done.  If mouth or throat bother you at all,  try brushing teeth/gums/tongue with arm and hammer toothpaste/ make a slurry and gargle and spit out.  >>>  Remember how golfers warm up by taking practice swings - do this with an empty inhaler   Please schedule a follow up office visit in 4 weeks, sooner if needed  with all medications /inhalers/ solutions in hand so we can verify exactly what you are taking. This includes all medications from all doctors and over the counters

## 2022-12-12 NOTE — Assessment & Plan Note (Signed)
Quit smoking 09/2021  - alpha one AT screen   03/25/2015  MM level 176  Last worked Jun 29 2017 seeking full disability   - PFT's  10/20/2017  FEV1 1.16 (50 % ) ratio 58  p 14 % improvement from saba p dulera/spriva prior to study with DLCO  78 % corrects to 94 % for alv volume  erv  15%  - 05/30/2019   try bevespi 2 bid  - 10/14/2022  After extensive coaching inhaler device,  effectiveness =    75% from baseline of 50% > continue breztri  - Allergy screen 10/14/2022 >  Eos 0. 4/  IgE  184 - 12/11/2022  After extensive coaching inhaler device,  effectiveness =    75% (short ti)   DDX of  difficult airways management almost all start with A and  include Adherence, Ace Inhibitors, Acid Reflux, Active Sinus Disease, Alpha 1 Antitripsin deficiency, Anxiety masquerading as Airways dz,  ABPA,  Allergy(esp in young), Aspiration (esp in elderly), Adverse effects of meds,  Active smoking or vaping, A bunch of PE's (a small clot burden can't cause this syndrome unless there is already severe underlying pulm or vascular dz with poor reserve) plus two Bs  = Bronchiectasis and Beta blocker use..and one C= CHF  Adherence is always the initial "prime suspect" and is a multilayered concern that requires a "trust but verify" approach in every patient - starting with knowing how to use medications, especially inhalers, correctly, keeping up with refills and understanding the fundamental difference between maintenance and prns vs those medications only taken for a very short course and then stopped and not refilled.  - see hfa teaching - return with all meds in hand using a trust but verify approach to confirm accurate Medication  Reconciliation The principal here is that until we are certain that the  patients are doing what we've asked, it makes no sense to ask them to do more.   ? Acid (or non-acid) GERD > always difficult to exclude as up to 75% of pts in some series report no assoc GI/ Heartburn symptoms> rec max (24h)   acid suppression and diet restrictions/ reviewed and instructions given in writing.   ? Active sinus dz > augmentinx 10 d  ? Allergy > Prednisone 10 mg take  4 each am x 2 days,   2 each am x 2 days,  1 each am x 2 days and stop   ? ACE effedts:   The sacubitrilat component of entresto is not and ACEi but it does lead to higher levels of bradykinin (the culprit in ACEi related cough) because it reduces Neprilysin based clearance of bradykinin. The typical symptoms are dry daytime cough (9% per PI) or complaints of a new sensation of globus or excess PNDS and are indistinguishable from her present refractory symptoms so may need trial off entresto and just on the valsartan component/ advised.

## 2022-12-12 NOTE — Assessment & Plan Note (Signed)
Body mass index is 51.36 kg/m.  -  trending no change  Lab Results  Component Value Date   TSH 1.31 10/14/2022      Contributing to doe and risk of GERD >>>   reviewed the need and the process to achieve and maintain neg calorie balance > defer f/u primary care including intermittently monitoring thyroid status            Each maintenance medication was reviewed in detail including emphasizing most importantly the difference between maintenance and prns and under what circumstances the prns are to be triggered using an action plan format where appropriate.  Total time for H and P, chart review, counseling, reviewing hfa/neb/02  device(s) and generating customized AVS unique to this office visit / same day charting = > 30 min for multiple  refractory respiratory  symptoms of uncertain etiology

## 2022-12-12 NOTE — Assessment & Plan Note (Signed)
07/30/2015   Walked RA  2 laps @ 185 ft each stopped due to  Sob with sats still 90% so rec 02 hs only plugged into cpap machine - as of 09/16/2017  02 = 2lpm at rest and 3lpm with activity as may be developing cor pulmonale - 01/19/2018   Walked 2lpm x  2 laps @ 185 ft each stopped due to  Sob with sats ok at moderate pace  Again advised: Make sure you check your oxygen saturation  AT  your highest level of activity (not after you stop)   to be sure it stays over 90% and adjust  02 flow upward to maintain this level if needed but remember to turn it back to previous settings when you stop (to conserve your supply).

## 2022-12-14 NOTE — Telephone Encounter (Signed)
Faxed order to [facility] at [phone], attn:  Arneta Cliche.

## 2022-12-14 NOTE — Telephone Encounter (Signed)
 Filled and in Lisa's box 

## 2022-12-15 NOTE — Telephone Encounter (Signed)
 Placed form in Dr. G's box.  

## 2022-12-15 NOTE — Telephone Encounter (Signed)
Faxed order to [facility] at [phone].

## 2022-12-15 NOTE — Telephone Encounter (Signed)
 Filled and in Lisa's box 

## 2022-12-15 NOTE — Telephone Encounter (Signed)
[facility] faxed over an additional part of the form that wasn't filled out and signed by Dr. Reece Agar. Placed in his S-drive. Thank you!

## 2022-12-24 NOTE — Progress Notes (Signed)
Carelink Summary Report / Loop Recorder 

## 2023-01-11 ENCOUNTER — Ambulatory Visit: Payer: 59

## 2023-01-11 DIAGNOSIS — G459 Transient cerebral ischemic attack, unspecified: Secondary | ICD-10-CM | POA: Diagnosis not present

## 2023-01-14 ENCOUNTER — Other Ambulatory Visit (INDEPENDENT_AMBULATORY_CARE_PROVIDER_SITE_OTHER): Payer: 59

## 2023-01-14 ENCOUNTER — Ambulatory Visit (INDEPENDENT_AMBULATORY_CARE_PROVIDER_SITE_OTHER): Payer: 59 | Admitting: Primary Care

## 2023-01-14 ENCOUNTER — Encounter: Payer: Self-pay | Admitting: Primary Care

## 2023-01-14 VITALS — BP 100/60 | HR 69 | Temp 98.4°F | Ht 60.0 in | Wt 270.6 lb

## 2023-01-14 DIAGNOSIS — I5042 Chronic combined systolic (congestive) and diastolic (congestive) heart failure: Secondary | ICD-10-CM

## 2023-01-14 DIAGNOSIS — J9611 Chronic respiratory failure with hypoxia: Secondary | ICD-10-CM

## 2023-01-14 DIAGNOSIS — R0609 Other forms of dyspnea: Secondary | ICD-10-CM

## 2023-01-14 DIAGNOSIS — J449 Chronic obstructive pulmonary disease, unspecified: Secondary | ICD-10-CM

## 2023-01-14 DIAGNOSIS — R058 Other specified cough: Secondary | ICD-10-CM

## 2023-01-14 DIAGNOSIS — G4733 Obstructive sleep apnea (adult) (pediatric): Secondary | ICD-10-CM | POA: Diagnosis not present

## 2023-01-14 LAB — BASIC METABOLIC PANEL
BUN: 11 mg/dL (ref 6–23)
CO2: 29 mEq/L (ref 19–32)
Calcium: 9.4 mg/dL (ref 8.4–10.5)
Chloride: 99 mEq/L (ref 96–112)
Creatinine, Ser: 1.08 mg/dL (ref 0.40–1.20)
GFR: 54.29 mL/min — ABNORMAL LOW (ref >60.00)
Glucose, Bld: 97 mg/dL (ref 70–99)
Potassium: 4.5 mEq/L (ref 3.5–5.1)
Sodium: 137 mEq/L (ref 135–145)

## 2023-01-14 LAB — BRAIN NATRIURETIC PEPTIDE: Pro B Natriuretic peptide (BNP): 34 pg/mL (ref 0.0–100.0)

## 2023-01-14 MED ORDER — PREDNISONE 10 MG PO TABS: ORAL_TABLET | ORAL | 0 refills | Status: DC

## 2023-01-14 MED ORDER — METHYLPREDNISOLONE ACETATE 80 MG/ML IJ SUSP
80.0000 mg | Freq: Once | INTRAMUSCULAR | Status: AC
Start: 2023-01-14 — End: 2023-01-14
  Administered 2023-01-14: 80 mg via INTRAMUSCULAR

## 2023-01-14 MED ORDER — PANTOPRAZOLE SODIUM 40 MG PO TBEC: 40.0000 mg | DELAYED_RELEASE_TABLET | Freq: Two times a day (BID) | ORAL | 2 refills | Status: DC

## 2023-01-14 NOTE — Assessment & Plan Note (Signed)
-   Advised patient resume CPAP use

## 2023-01-14 NOTE — Patient Instructions (Addendum)
Recommendations:  Continue Breztri 2 puffs every morning and evening (use with spacer and rinse mouth) Continue to use nebulizer every 4-6 hours as needed for breakthrough shortness of breath or wheezing Continue pantoprazole 40 mg -Increase to twice daily Continue Pepcid 20 mg at bedtime Take Zyrtec-D in place of Mucinex D daily  Resume prednisone taper Labs today- likely will need to adjust your diuretics   Orders Labs (BMP and BNP)  Office treatment: Depo-Medrol 80 mg IM x 1  Follow-up: First available with Dr. Sherene Sires ONLY  / if nothing open ok to double book Beth in 2 weeks (with a virtual slot)

## 2023-01-14 NOTE — Assessment & Plan Note (Addendum)
-   Currently exacerbated; Upper airway wheeze and productive cough  - Advised patient use Breztri Aerosphere 2 puffs q 12 hours (use with spacer) - Continue Albuterol nebulizer q4-6 hours for breakthrough shortness of breath/wheezing  - Received depo-medrol 80mg  IM x1 and sending in prednisone taper  - May need trial off entresto and just on the valsartan component at follow-up if cough is not better

## 2023-01-14 NOTE — Assessment & Plan Note (Signed)
-   Appears fluid overloaded on exam - She reports 5 lbs weight gain and leg swelling - Checking BNP and BMET - Continue lasix 20mg  daily and spironolactone as directed, likely will need to be adjusted

## 2023-01-14 NOTE — Assessment & Plan Note (Addendum)
-   Maximize treatment for GERD/PND  - Increase Prontonix 40mg  BID and Pepcid 20mg  at bedtime - Advised patient swittch from mucinex-D to zyrtec D

## 2023-01-14 NOTE — Progress Notes (Signed)
@Patient  ID: Brenda [name], female    DOB: [birth year], 62 y.o.   MRN: [id number]  Chief Complaint  Patient presents with   Follow-up    Increased sob x 4 mths., cough-creamy yellow, wheezing, denies ferver    Referring provider: [name], MD  HPI: 62 year old female, former smoker.  Past medical history significant for COPD Gold 2, OSA on CPAP, chronic respiratory failure, hypertension, cor pulmonale, CHF, coronary artery disease, type 2 diabetes, chronic kidney disease, morbid obesity.  Patient of Dr. Sherene Sires.  01/14/2023- Interim hx  Patient presents today for 4 week follow-up.   -She has upper airway wheeze and productive cough with creamy yellow/color -She was seen on July 12th by Dr. Sherene Sires and treated for COPD exacerbation with course of Augmentin and prednisone taper. She saw no improvement after taking medication.  -Using Markus Daft Aerosphere two puffs twice daily -She used her nebulizer this morning without much improvement  -Wearing 2L 24/7  -She reports compliance with Protonix and Pepcid  -She has been taking Mucinex-D  -She has a lot of swelling in legs, she takes lasix 20mg  daily and spirnolactone   -She has gained 5-7 lbs since last visit  -She has CPAP but has not been able to wear it for the last 6 months. Feels like she is suffocating. She has an incline bed.   Airview download 06/12/2020-09/09/2020  06/12/2020 - 09/09/2020 Usage days 28/90 days (31%) Average usage days used 7 hours 16 minutes Pressure 5 to 15 cm H2O (14.4cm h20-95%) AHI 3.0    Allergies  Allergen Reactions   Metolazone Other (See Comments)    Metabolic mood swings    Immunization History  Administered Date(s) Administered   Influenza Whole 04/01/2002   Influenza,inj,Quad PF,6+ Mos 03/21/2015, 07/27/2018, 02/08/2019, 02/23/2020, 04/04/2021, 04/10/2022   Influenza-Unspecified 03/01/2014, 04/22/2016, 03/25/2017   PFIZER(Purple Top)SARS-COV-2 Vaccination 02/09/2020, 03/06/2020    PNEUMOCOCCAL CONJUGATE-20 10/01/2020   Pneumococcal Polysaccharide-23 04/01/2002, 07/24/2015   Td 06/01/1993   Tdap 02/24/2017    Past Medical History:  Diagnosis Date   Acute pancreatitis    Asthma    main dyspnea thought due to deconditioning (10/2013)   Bipolar I disorder, most recent episode (or current) manic, unspecified    pt denies this   Bronchitis, chronic obstructive 2008 & 2009   FeV1 64% TLC 105% DLCO 55% 2008  -  FeV1 81% FeF 25-75 43% 2009   CAD (coronary artery disease)    inferior STEMI in 10/2020 s/p DES to RCA   Chronic combined systolic and diastolic CHF (congestive heart failure) (HCC)    LVEF 30-35% in 01/2020 but improved to 60-65% in 05/2021   COPD (chronic obstructive pulmonary disease) (HCC)    emphysema, not an issue per pulm (10/2013)   History of pyelonephritis 05/2012   History of ST elevation myocardial infarction (STEMI)    HTN (hypertension)    Hx of migraines    Hyperlipidemia    Knee pain, right    Lumbar disc disease with radiculopathy    multilevel spondylitic changes, scoliosis, and anterolisthesis L4 not thought to currently be surg candidate (Dr. Phoebe Perch, [facility]) (Dr. Ollen Bowl, [place])   Nicotine addiction    Obesity    RSV (respiratory syncytial virus pneumonia) 01/2020   hospitalization   Suicide attempt (HCC) 06/2001   Swelling of limb    TIA (transient ischemic attack)    Type 2 diabetes mellitus (HCC)    Urge and stress incontinence 07/2012   ([facility])    Tobacco History:  Social History   Tobacco Use  Smoking Status Former   Current packs/day: 0.00   Average packs/day: 0.3 packs/day for 41.0 years (10.3 ttl pk-yrs)   Types: Cigarettes   Start date: 10/01/1980   Quit date: 10/01/2021   Years since quitting: 1.2   Passive exposure: Current  Smokeless Tobacco Never   Counseling given: Not Answered   Outpatient Medications Prior to Visit  Medication Sig Dispense Refill   albuterol (PROVENTIL) (2.5 MG/3ML) 0.083% nebulizer  solution INHALE 1 VIAL VIA NEBULIZER EVERY 6 HOURS AS NEEDED FOR WHEEZING/SHORTNESS OF BREATH 150 mL 1   albuterol (VENTOLIN HFA) 108 (90 Base) MCG/ACT inhaler Inhale 2 puffs into the lungs every 6 (six) hours as needed for wheezing or shortness of breath. 18 g 3   ARIPiprazole (ABILIFY) 10 MG tablet Take 1 tablet (10 mg total) by mouth daily. 90 tablet 4   aspirin EC 81 MG EC tablet Take 1 tablet (81 mg total) by mouth daily. Swallow whole. 30 tablet 0   atorvastatin (LIPITOR) 80 MG tablet TAKE 1 TABLET BY MOUTH EVERYDAY AT BEDTIME For cholesterol 90 tablet 4   Budeson-Glycopyrrol-Formoterol (BREZTRI AEROSPHERE) 160-9-4.8 MCG/ACT AERO Inhale 2 puffs into the lungs in the morning and at bedtime.     cetirizine (ZYRTEC) 10 MG tablet Take 1 tablet (10 mg total) by mouth daily.     Cholecalciferol (VITAMIN D3) 25 MCG (1000 UT) CAPS Take 1 capsule (1,000 Units total) by mouth daily. 30 capsule    dextromethorphan-guaiFENesin (MUCINEX DM) 30-600 MG 12hr tablet Take 1 tablet by mouth 2 (two) times daily as needed for cough.     Dulaglutide (TRULICITY) 0.75 MG/0.5ML SOPN Inject 0.75 mg into the skin once a week. 6 mL 4   escitalopram (LEXAPRO) 20 MG tablet Take 1 tablet (20 mg total) by mouth daily. 90 tablet 4   famotidine (PEPCID) 20 MG tablet One after supper 30 tablet 11   fluticasone (FLONASE) 50 MCG/ACT nasal spray PLACE 1 SPRAY INTO BOTH NOSTRILS 2 (TWO) TIMES DAILY 48 mL 3   furosemide (LASIX) 20 MG tablet TAKE 1 TABLET BY MOUTH EVERY DAY 90 tablet 0   gabapentin (NEURONTIN) 300 MG capsule Take 2 capsules (600 mg total) by mouth in the morning AND 3 capsules (900 mg total) at bedtime. 450 capsule 4   JARDIANCE 10 MG TABS tablet TAKE 1 TABLET BY MOUTH EVERY DAY BEFORE BREAKFAST 90 tablet 3   LORazepam (ATIVAN) 0.5 MG tablet TAKE 1/2 TO 1 TABLET BY MOUTH TWICE A DAY AS NEEDED FOR ANXIETY 30 tablet 2   metoprolol succinate (TOPROL-XL) 25 MG 24 hr tablet Take 1 tablet (25 mg total) by mouth daily. 90  tablet 3   Multiple Vitamin (MULTIVITAMIN WITH MINERALS) TABS tablet Take 1 tablet by mouth daily.     nitroGLYCERIN (NITROSTAT) 0.4 MG SL tablet Place 1 tablet (0.4 mg total) under the tongue every 5 (five) minutes as needed. 25 tablet 2   sacubitril-valsartan (ENTRESTO) 24-26 MG Take 1 tablet by mouth 2 (two) times daily. 180 tablet 3   spironolactone (ALDACTONE) 25 MG tablet Take 1 tablet (25 mg total) by mouth daily. 90 tablet 3   traMADol (ULTRAM) 50 MG tablet Take 1 tablet (50 mg total) by mouth 2 (two) times daily as needed for moderate pain. 20 tablet 2   pantoprazole (PROTONIX) 40 MG tablet Take 1 tablet (40 mg total) by mouth daily. Take 30-60 min before first meal of the day 30 tablet 2  BREZTRI AEROSPHERE 160-9-4.8 MCG/ACT AERO INHALE 2 PUFFS INTO LUNGS 2 TIMES DAILY 10.7 each 11   amoxicillin-clavulanate (AUGMENTIN) 875-125 MG tablet Take 1 tablet by mouth 2 (two) times daily. (Patient not taking: Reported on 01/14/2023) 20 tablet 0   predniSONE (DELTASONE) 10 MG tablet Take  4 each am x 2 days,   2 each am x 2 days,  1 each am x 2 days and stop (Patient not taking: Reported on 01/14/2023) 14 tablet 0   No facility-administered medications prior to visit.   Review of Systems  Review of Systems  Constitutional: Negative.   HENT: Negative.    Respiratory: Negative.    Cardiovascular: Negative.    Physical Exam  BP 100/60 (BP Location: Left Wrist, Cuff Size: Large)   Pulse 69   Temp 98.4 F (36.9 C) (Temporal)   Ht 5' (1.524 m)   Wt 270 lb 9.6 oz (122.7 kg)   LMP 03/08/2013   SpO2 98%   BMI 52.85 kg/m  Physical Exam Constitutional:      General: She is not in acute distress.    Appearance: Normal appearance. She is obese. She is not ill-appearing.  HENT:     Head: Normocephalic and atraumatic.  Cardiovascular:     Rate and Rhythm: Normal rate and regular rhythm.  Pulmonary:     Effort: Pulmonary effort is normal.     Breath sounds: Wheezing present.     Comments:  Upper airway wheeze  Musculoskeletal:        General: Normal range of motion.  Skin:    General: Skin is warm and dry.  Neurological:     General: No focal deficit present.     Mental Status: She is alert and oriented to person, place, and time. Mental status is at baseline.  Psychiatric:        Mood and Affect: Mood normal.        Behavior: Behavior normal.        Thought Content: Thought content normal.        Judgment: Judgment normal.      Lab Results:  CBC    Component Value Date/Time   WBC 12.5 (H) 10/14/2022 1619   RBC 5.28 (H) 10/14/2022 1619   HGB 15.4 (H) 10/14/2022 1619   HCT 48.0 (H) 10/14/2022 1619   PLT 266.0 10/14/2022 1619   MCV 90.9 10/14/2022 1619   MCH 29.5 11/03/2020 0719   MCHC 32.1 10/14/2022 1619   RDW 15.9 (H) 10/14/2022 1619   LYMPHSABS 2.5 10/14/2022 1619   MONOABS 0.8 10/14/2022 1619   EOSABS 0.4 10/14/2022 1619   BASOSABS 0.1 10/14/2022 1619    BMET    Component Value Date/Time   NA 137 01/14/2023 1036   NA 142 06/10/2020 1446   K 4.5 01/14/2023 1036   CL 99 01/14/2023 1036   CO2 29 01/14/2023 1036   GLUCOSE 97 01/14/2023 1036   BUN 11 01/14/2023 1036   BUN 11 06/10/2020 1446   CREATININE 1.08 01/14/2023 1036   CALCIUM 9.4 01/14/2023 1036   GFRNONAA >60 11/03/2020 0719   GFRAA 65 06/10/2020 1446    BNP    Component Value Date/Time   BNP 47.7 01/30/2020 0955    ProBNP    Component Value Date/Time   PROBNP 34.0 01/14/2023 1036    Imaging: CUP PACEART REMOTE DEVICE CHECK  Result Date: 01/11/2023 ILR summary report received. Battery status OK. Normal device function. No new symptom, tachy, brady, or pause episodes. No new  AF episodes.  AF burden is 0% of the time.   Monthly summary reports and ROV/PRN Hassell Halim, RN, CCDS, [employer]    Assessment & Plan:   OSA on CPAP - Advised patient resume CPAP use  COPD GOLD 2 - Currently exacerbated; Upper airway wheeze and productive cough  - Advised patient use  Breztri Aerosphere 2 puffs q 12 hours (use with spacer) - Continue Albuterol nebulizer q4-6 hours for breakthrough shortness of breath/wheezing  - Received depo-medrol 80mg  IM x1 and sending in prednisone taper  - May need trial off entresto and just on the valsartan component at follow-up if cough is not better   Chronic respiratory failure with hypoxia (HCC) - Stable; Oxygen dependent 2L 24/7   Chronic combined systolic and diastolic CHF (congestive heart failure) (HCC) - Appears fluid overloaded on exam - She reports 5 lbs weight gain and leg swelling - Checking BNP and BMET - Continue lasix 20mg  daily and spironolactone as directed, likely will need to be adjusted   Upper airway cough syndrome - Maximize treatment for GERD/PND  - Increase Prontonix 40mg  BID and Pepcid 20mg  at bedtime - Advised patient swittch from mucinex-D to zyrtec D   Glenford Bayley, NP 01/14/2023

## 2023-01-14 NOTE — Assessment & Plan Note (Signed)
-   Stable; Oxygen dependent 2L 24/7

## 2023-01-17 ENCOUNTER — Other Ambulatory Visit: Payer: Self-pay | Admitting: Family Medicine

## 2023-01-17 ENCOUNTER — Other Ambulatory Visit: Payer: Self-pay | Admitting: Primary Care

## 2023-01-18 ENCOUNTER — Other Ambulatory Visit: Payer: Self-pay | Admitting: Family Medicine

## 2023-01-18 NOTE — Telephone Encounter (Signed)
Patient was seen by pulmonology on 8/15. I see where they advised albuterol nebulizer but do not see direction on albuterol inhaler. Ok to refill as requested?

## 2023-01-19 NOTE — Telephone Encounter (Signed)
ERx 

## 2023-01-26 NOTE — Progress Notes (Signed)
Carelink Summary Report / Loop Recorder 

## 2023-01-28 ENCOUNTER — Telehealth (INDEPENDENT_AMBULATORY_CARE_PROVIDER_SITE_OTHER): Payer: 59 | Admitting: Family Medicine

## 2023-01-28 ENCOUNTER — Other Ambulatory Visit: Payer: Self-pay | Admitting: Family Medicine

## 2023-01-28 ENCOUNTER — Telehealth: Payer: Self-pay | Admitting: Family Medicine

## 2023-01-28 ENCOUNTER — Encounter: Payer: Self-pay | Admitting: Family Medicine

## 2023-01-28 VITALS — BP 120/62 | HR 72 | Temp 98.2°F | Ht 60.0 in | Wt 263.0 lb

## 2023-01-28 DIAGNOSIS — H1030 Unspecified acute conjunctivitis, unspecified eye: Secondary | ICD-10-CM

## 2023-01-28 MED ORDER — NEOMYCIN-POLYMYXIN-HC 3.5-10000-1 OP SUSP: 4.0000 [drp] | Freq: Four times a day (QID) | OPHTHALMIC | 0 refills | Status: DC

## 2023-01-28 NOTE — Telephone Encounter (Signed)
Patient called in stating that she has pink eye,and would like to know if a prescription could be called in with out her being seen?   [id number] [place], [place] - [address] Phone: [phone]  Fax: [phone]

## 2023-01-28 NOTE — Progress Notes (Signed)
VIRTUAL VISIT A virtual visit is felt to be most appropriate for this patient at this time.   I connected with the patient on 01/28/23 at  2:00 PM EDT by virtual telehealth platform and verified that I am speaking with the correct person using two identifiers.   I discussed the limitations, risks, security and privacy concerns of performing an evaluation and management service by  virtual telehealth platform and the availability of in person appointments. I also discussed with the patient that there may be a patient responsible charge related to this service. The patient expressed understanding and agreed to proceed.  Patient location: Home Provider Location: [place] Participants: Kerby Nora and [name]   Chief Complaint  Patient presents with   Conjunctivitis    History of Present Illness:  62 y.o. female patient of [name], MD presents with  new onset eye redness.  She reports  new onset  3 month ago.. eye redness,discharge bilaterally.  PCP felt Now draining sticky yellow green discharge, crusting. No fever. No vision change.. no eye pain other than eye irriation.  No current sinus pressure, no ear pain. No congestion.  History of allergies this time of year... started Zyrtec D... did not help eyes at all.   She has tried systane possible dry eye.    On  Oxygen for COPD  No history of glaucoma  COVID 19 screen No recent travel or known exposure to COVID19 The patient denies respiratory symptoms of COVID 19 at this time.  The importance of social distancing was discussed today.   Review of Systems  Constitutional:  Negative for chills and fever.  HENT:  Negative for congestion and ear pain.   Eyes:  Positive for discharge and redness. Negative for pain.  Respiratory:  Negative for cough and shortness of breath.   Cardiovascular:  Negative for chest pain, palpitations and leg swelling.  Gastrointestinal:  Negative for abdominal pain, blood in  stool, constipation, diarrhea, nausea and vomiting.  Genitourinary:  Negative for dysuria.  Musculoskeletal:  Negative for falls and myalgias.  Skin:  Negative for rash.  Neurological:  Negative for dizziness.  Psychiatric/Behavioral:  Negative for depression. The patient is not nervous/anxious.       Past Medical History:  Diagnosis Date   Acute pancreatitis    Asthma    main dyspnea thought due to deconditioning (10/2013)   Bipolar I disorder, most recent episode (or current) manic, unspecified    pt denies this   Bronchitis, chronic obstructive 2008 & 2009   FeV1 64% TLC 105% DLCO 55% 2008  -  FeV1 81% FeF 25-75 43% 2009   CAD (coronary artery disease)    inferior STEMI in 10/2020 s/p DES to RCA   Chronic combined systolic and diastolic CHF (congestive heart failure) (HCC)    LVEF 30-35% in 01/2020 but improved to 60-65% in 05/2021   COPD (chronic obstructive pulmonary disease) (HCC)    emphysema, not an issue per pulm (10/2013)   History of pyelonephritis 05/2012   History of ST elevation myocardial infarction (STEMI)    HTN (hypertension)    Hx of migraines    Hyperlipidemia    Knee pain, right    Lumbar disc disease with radiculopathy    multilevel spondylitic changes, scoliosis, and anterolisthesis L4 not thought to currently be surg candidate (Dr. Phoebe Perch, [facility]) (Dr. Ollen Bowl, [place])   Nicotine addiction    Obesity    RSV (respiratory syncytial virus pneumonia) 01/2020   hospitalization  Suicide attempt (HCC) 06/2001   Swelling of limb    TIA (transient ischemic attack)    Type 2 diabetes mellitus (HCC)    Urge and stress incontinence 07/2012   ([facility])    reports that she quit smoking about 15 months ago. Her smoking use included cigarettes. She started smoking about 42 years ago. She has a 10.3 pack-year smoking history. She has been exposed to tobacco smoke. She has never used smokeless tobacco. She reports current alcohol use. She reports that she does not  currently use drugs after having used the following drugs: Marijuana.   Current Outpatient Medications:    albuterol (PROVENTIL) (2.5 MG/3ML) 0.083% nebulizer solution, INHALE 1 VIAL VIA NEBULIZER EVERY 6 HOURS AS NEEDED FOR WHEEZING/SHORTNESS OF BREATH, Disp: 150 mL, Rfl: 1   albuterol (VENTOLIN HFA) 108 (90 Base) MCG/ACT inhaler, TAKE 2 PUFFS BY MOUTH EVERY 6 HOURS AS NEEDED FOR WHEEZE OR SHORTNESS OF BREATH, Disp: 8.5 each, Rfl: 3   ARIPiprazole (ABILIFY) 10 MG tablet, Take 1 tablet (10 mg total) by mouth daily., Disp: 90 tablet, Rfl: 4   aspirin EC 81 MG EC tablet, Take 1 tablet (81 mg total) by mouth daily. Swallow whole., Disp: 30 tablet, Rfl: 0   atorvastatin (LIPITOR) 80 MG tablet, TAKE 1 TABLET BY MOUTH EVERYDAY AT BEDTIME For cholesterol, Disp: 90 tablet, Rfl: 4   BREZTRI AEROSPHERE 160-9-4.8 MCG/ACT AERO, INHALE 2 PUFFS INTO LUNGS 2 TIMES DAILY, Disp: 10.7 each, Rfl: 11   Budeson-Glycopyrrol-Formoterol (BREZTRI AEROSPHERE) 160-9-4.8 MCG/ACT AERO, Inhale 2 puffs into the lungs in the morning and at bedtime., Disp: , Rfl:    cetirizine (ZYRTEC) 10 MG tablet, Take 1 tablet (10 mg total) by mouth daily., Disp: , Rfl:    Cholecalciferol (VITAMIN D3) 25 MCG (1000 UT) CAPS, Take 1 capsule (1,000 Units total) by mouth daily., Disp: 30 capsule, Rfl:    dextromethorphan-guaiFENesin (MUCINEX DM) 30-600 MG 12hr tablet, Take 1 tablet by mouth 2 (two) times daily as needed for cough., Disp: , Rfl:    Dulaglutide (TRULICITY) 0.75 MG/0.5ML SOPN, Inject 0.75 mg into the skin once a week., Disp: 6 mL, Rfl: 4   escitalopram (LEXAPRO) 20 MG tablet, Take 1 tablet (20 mg total) by mouth daily., Disp: 90 tablet, Rfl: 4   famotidine (PEPCID) 20 MG tablet, One after supper, Disp: 30 tablet, Rfl: 11   fluticasone (FLONASE) 50 MCG/ACT nasal spray, PLACE 1 SPRAY INTO BOTH NOSTRILS 2 (TWO) TIMES DAILY, Disp: 48 mL, Rfl: 3   furosemide (LASIX) 20 MG tablet, TAKE 1 TABLET BY MOUTH EVERY DAY, Disp: 90 tablet, Rfl: 0    gabapentin (NEURONTIN) 300 MG capsule, Take 2 capsules (600 mg total) by mouth in the morning AND 3 capsules (900 mg total) at bedtime., Disp: 450 capsule, Rfl: 4   JARDIANCE 10 MG TABS tablet, TAKE 1 TABLET BY MOUTH EVERY DAY BEFORE BREAKFAST, Disp: 90 tablet, Rfl: 3   LORazepam (ATIVAN) 0.5 MG tablet, TAKE 1/2 TO 1 TABLET BY MOUTH TWICE A DAY AS NEEDED FOR ANXIETY, Disp: 30 tablet, Rfl: 2   metoprolol succinate (TOPROL-XL) 25 MG 24 hr tablet, Take 1 tablet (25 mg total) by mouth daily., Disp: 90 tablet, Rfl: 3   Multiple Vitamin (MULTIVITAMIN WITH MINERALS) TABS tablet, Take 1 tablet by mouth daily., Disp: , Rfl:    neomycin-polymyxin-hydrocortisone (CORTISPORIN) 3.5-10000-1 ophthalmic suspension, Place 4 drops into both eyes 4 (four) times daily., Disp: 7.5 mL, Rfl: 0   nitroGLYCERIN (NITROSTAT) 0.4 MG SL tablet, Place 1  tablet (0.4 mg total) under the tongue every 5 (five) minutes as needed., Disp: 25 tablet, Rfl: 2   pantoprazole (PROTONIX) 40 MG tablet, TAKE 1 TABLET BY MOUTH 2 TIMES DAILY 30-60 MIN BEFORE FIRST MEAL OF THE DAY, Disp: 180 tablet, Rfl: 3   sacubitril-valsartan (ENTRESTO) 24-26 MG, Take 1 tablet by mouth 2 (two) times daily., Disp: 180 tablet, Rfl: 3   spironolactone (ALDACTONE) 25 MG tablet, Take 1 tablet (25 mg total) by mouth daily., Disp: 90 tablet, Rfl: 3   traMADol (ULTRAM) 50 MG tablet, Take 1 tablet (50 mg total) by mouth 2 (two) times daily as needed for moderate pain., Disp: 20 tablet, Rfl: 2   Observations/Objective: Blood pressure 120/62, pulse 72, temperature 98.2 F (36.8 C), temperature source Oral, height 5' (1.524 m), weight 263 lb (119.3 kg), last menstrual period 03/08/2013, SpO2 97%.  Physical Exam Constitutional:      General: The patient is not in acute distress.  Eyelid edges appear red, conjunctive but erythematous, extraocular muscles intact and pupils are 4 and reactive bilaterally Pulmonary:     Effort: Pulmonary effort is normal. No respiratory  distress.  Neurological:     Mental Status: The patient is alert and oriented to person, place, and time.  Psychiatric:        Mood and Affect: Mood normal.        Behavior: Behavior normal.    Assessment and Plan Acute conjunctivitis, unspecified acute conjunctivitis type, unspecified laterality Assessment & Plan: Acute eye redness without pain, bilaterally.  Given ongoing for 3 months or more some significant concern for allergic conjunctivitis versus blepharitis. Given new change from minimal discharge to copious green discharge will start treatment with antibacterial eyedrops.  Will include hydrocortisone in drops to treat irritation (patient does not have glaucoma history). Recommended use of Zyrtec as well as Flonase 2 sprays per nostril daily for possible allergy source.  No clear sign of orbital infection.  Return and ER precautions provided.  Encouraged follow-up next week if symptoms or not improving as expected.   Other orders -     Neomycin-Polymyxin-HC; Place 4 drops into both eyes 4 (four) times daily.  Dispense: 7.5 mL; Refill: 0      I discussed the assessment and treatment plan with the patient. The patient was provided an opportunity to ask questions and all were answered. The patient agreed with the plan and demonstrated an understanding of the instructions.   The patient was advised to call back or seek an in-person evaluation if the symptoms worsen or if the condition fails to improve as anticipated.     Kerby Nora, MD

## 2023-01-28 NOTE — Telephone Encounter (Signed)
Spoke with pt notifying her she will need OV to be evaluated. Pt verbalizes understanding and scheduled video visit with Dr. Ermalene Searing today at 2:00.

## 2023-01-28 NOTE — Assessment & Plan Note (Signed)
Acute eye redness without pain, bilaterally.  Given ongoing for 3 months or more some significant concern for allergic conjunctivitis versus blepharitis. Given new change from minimal discharge to copious green discharge will start treatment with antibacterial eyedrops.  Will include hydrocortisone in drops to treat irritation (patient does not have glaucoma history). Recommended use of Zyrtec as well as Flonase 2 sprays per nostril daily for possible allergy source.  No clear sign of orbital infection.  Return and ER precautions provided.  Encouraged follow-up next week if symptoms or not improving as expected.

## 2023-01-31 DIAGNOSIS — G4733 Obstructive sleep apnea (adult) (pediatric): Secondary | ICD-10-CM | POA: Diagnosis not present

## 2023-02-04 ENCOUNTER — Telehealth (INDEPENDENT_AMBULATORY_CARE_PROVIDER_SITE_OTHER): Payer: 59 | Admitting: Primary Care

## 2023-02-04 DIAGNOSIS — J399 Disease of upper respiratory tract, unspecified: Secondary | ICD-10-CM | POA: Diagnosis not present

## 2023-02-04 DIAGNOSIS — G4733 Obstructive sleep apnea (adult) (pediatric): Secondary | ICD-10-CM | POA: Diagnosis not present

## 2023-02-04 DIAGNOSIS — J449 Chronic obstructive pulmonary disease, unspecified: Secondary | ICD-10-CM

## 2023-02-04 DIAGNOSIS — I5042 Chronic combined systolic (congestive) and diastolic (congestive) heart failure: Secondary | ICD-10-CM

## 2023-02-04 DIAGNOSIS — J9611 Chronic respiratory failure with hypoxia: Secondary | ICD-10-CM | POA: Diagnosis not present

## 2023-02-04 NOTE — Progress Notes (Signed)
Virtual Visit via Video Note  I connected with Brenda [name] on 02/04/23 at  9:30 AM EDT by a video enabled telemedicine application and verified that I am speaking with the correct person using two identifiers.  Location: Patient: Home Provider: Office    I discussed the limitations of evaluation and management by telemedicine and the availability of in person appointments. The patient expressed understanding and agreed to proceed.  History of Present Illness: 62 year old female, former smoker.  Past medical history significant for COPD Gold 2, OSA on CPAP, chronic respiratory failure, hypertension, cor pulmonale, CHF, coronary artery disease, type 2 diabetes, chronic kidney disease, morbid obesity.  Patient of Dr. Sherene Sires.  Previous [facility] pulmonary encounter:  01/14/2023 Patient presents today for 4 week follow-up.   -She has upper airway wheeze and productive cough with creamy yellow/color -She was seen on July 12th by Dr. Sherene Sires and treated for COPD exacerbation with course of Augmentin and prednisone taper. She saw no improvement after taking medication.  -Using Markus Daft Aerosphere two puffs twice daily -She used her nebulizer this morning without much improvement  -Wearing 2L 24/7  -She reports compliance with Protonix and Pepcid  -She has been taking Mucinex-D  -She has a lot of swelling in legs, she takes lasix 20mg  daily and spirnolactone   -She has gained 5-7 lbs since last visit  -She has CPAP but has not been able to wear it for the last 6 months. Feels like she is suffocating. She has an incline bed.   [facility] download 06/12/2020-09/09/2020  06/12/2020 - 09/09/2020 Usage days 28/90 days (31%) Average usage days used 7 hours 16 minutes Pressure 5 to 15 cm H2O (14.4cm h20-95%) AHI 3.0    02/04/2023 She is doing a lot better since last OV in August, treatment for acute exacerbation. She feels back to baseline. She completed prednisone course and states that Zyrtec-d really helped her  symptoms. She is in the process of getting a new CPAP machine   Observations/Objective:  Appears well; No overt respiratory symptoms   Assessment and Plan:  OSA on CPAP - Advised patient resume CPAP use once she gets new machine  - FU in 3 months for compliance check   COPD GOLD 2 - Improved - Continue Breztri Aerosphere 2 puffs q 12 hours (use with spacer) - Continue Albuterol nebulizer q4-6 hours for breakthrough shortness of breath/wheezing   Chronic respiratory failure with hypoxia (HCC) - Stable; Oxygen dependent 2L 24/7   Chronic combined systolic and diastolic CHF (congestive heart failure) (HCC) - Continue lasix 20mg  daily and spironolactone as directed  Upper airway cough syndrome -  Prontonix 40mg  BID and Pepcid 20mg  at bedtime   Follow Up Instructions:  3 months or sooner if needed    I discussed the assessment and treatment plan with the patient. The patient was provided an opportunity to ask questions and all were answered. The patient agreed with the plan and demonstrated an understanding of the instructions.   The patient was advised to call back or seek an in-person evaluation if the symptoms worsen or if the condition fails to improve as anticipated.  I provided 22 minutes of non-face-to-face time during this encounter.   Glenford Bayley, NP

## 2023-02-15 ENCOUNTER — Ambulatory Visit (INDEPENDENT_AMBULATORY_CARE_PROVIDER_SITE_OTHER): Payer: 59

## 2023-02-15 DIAGNOSIS — G459 Transient cerebral ischemic attack, unspecified: Secondary | ICD-10-CM

## 2023-02-16 LAB — CUP PACEART REMOTE DEVICE CHECK
Date Time Interrogation Session: 20240913230336
Implantable Pulse Generator Implant Date: 20211102

## 2023-02-24 ENCOUNTER — Inpatient Hospital Stay (HOSPITAL_COMMUNITY)
Admission: EM | Admit: 2023-02-24 | Discharge: 2023-03-02 | DRG: 190 | Disposition: A | Discharge status: Home / Self Care | Payer: 59 | Attending: Family Medicine | Admitting: Family Medicine

## 2023-02-24 ENCOUNTER — Emergency Department (HOSPITAL_COMMUNITY): Payer: 59

## 2023-02-24 DIAGNOSIS — J209 Acute bronchitis, unspecified: Secondary | ICD-10-CM

## 2023-02-24 DIAGNOSIS — Z83438 Family history of other disorder of lipoprotein metabolism and other lipidemia: Secondary | ICD-10-CM

## 2023-02-24 DIAGNOSIS — N1831 Chronic kidney disease, stage 3a: Secondary | ICD-10-CM | POA: Diagnosis not present

## 2023-02-24 DIAGNOSIS — I5022 Chronic systolic (congestive) heart failure: Secondary | ICD-10-CM | POA: Diagnosis not present

## 2023-02-24 DIAGNOSIS — Z1152 Encounter for screening for COVID-19: Secondary | ICD-10-CM | POA: Diagnosis not present

## 2023-02-24 DIAGNOSIS — I890 Lymphedema, not elsewhere classified: Secondary | ICD-10-CM | POA: Diagnosis present

## 2023-02-24 DIAGNOSIS — E1122 Type 2 diabetes mellitus with diabetic chronic kidney disease: Secondary | ICD-10-CM | POA: Diagnosis not present

## 2023-02-24 DIAGNOSIS — I252 Old myocardial infarction: Secondary | ICD-10-CM | POA: Diagnosis not present

## 2023-02-24 DIAGNOSIS — E1165 Type 2 diabetes mellitus with hyperglycemia: Secondary | ICD-10-CM | POA: Diagnosis present

## 2023-02-24 DIAGNOSIS — K219 Gastro-esophageal reflux disease without esophagitis: Secondary | ICD-10-CM | POA: Diagnosis not present

## 2023-02-24 DIAGNOSIS — R0602 Shortness of breath: Secondary | ICD-10-CM | POA: Diagnosis not present

## 2023-02-24 DIAGNOSIS — E1140 Type 2 diabetes mellitus with diabetic neuropathy, unspecified: Secondary | ICD-10-CM | POA: Diagnosis not present

## 2023-02-24 DIAGNOSIS — Z6841 Body Mass Index (BMI) 40.0 and over, adult: Secondary | ICD-10-CM

## 2023-02-24 DIAGNOSIS — I5043 Acute on chronic combined systolic (congestive) and diastolic (congestive) heart failure: Secondary | ICD-10-CM | POA: Diagnosis not present

## 2023-02-24 DIAGNOSIS — Z79899 Other long term (current) drug therapy: Secondary | ICD-10-CM

## 2023-02-24 DIAGNOSIS — J9611 Chronic respiratory failure with hypoxia: Secondary | ICD-10-CM | POA: Diagnosis not present

## 2023-02-24 DIAGNOSIS — E119 Type 2 diabetes mellitus without complications: Secondary | ICD-10-CM

## 2023-02-24 DIAGNOSIS — J441 Chronic obstructive pulmonary disease with (acute) exacerbation: Secondary | ICD-10-CM | POA: Diagnosis not present

## 2023-02-24 DIAGNOSIS — I10 Essential (primary) hypertension: Secondary | ICD-10-CM | POA: Diagnosis not present

## 2023-02-24 DIAGNOSIS — J9621 Acute and chronic respiratory failure with hypoxia: Secondary | ICD-10-CM | POA: Diagnosis not present

## 2023-02-24 DIAGNOSIS — I2510 Atherosclerotic heart disease of native coronary artery without angina pectoris: Secondary | ICD-10-CM | POA: Diagnosis not present

## 2023-02-24 DIAGNOSIS — G6289 Other specified polyneuropathies: Secondary | ICD-10-CM | POA: Diagnosis not present

## 2023-02-24 DIAGNOSIS — I5031 Acute diastolic (congestive) heart failure: Secondary | ICD-10-CM | POA: Diagnosis not present

## 2023-02-24 DIAGNOSIS — Z7984 Long term (current) use of oral hypoglycemic drugs: Secondary | ICD-10-CM | POA: Diagnosis not present

## 2023-02-24 DIAGNOSIS — Z7951 Long term (current) use of inhaled steroids: Secondary | ICD-10-CM

## 2023-02-24 DIAGNOSIS — Z8249 Family history of ischemic heart disease and other diseases of the circulatory system: Secondary | ICD-10-CM | POA: Diagnosis not present

## 2023-02-24 DIAGNOSIS — Z9981 Dependence on supplemental oxygen: Secondary | ICD-10-CM

## 2023-02-24 DIAGNOSIS — I130 Hypertensive heart and chronic kidney disease with heart failure and stage 1 through stage 4 chronic kidney disease, or unspecified chronic kidney disease: Secondary | ICD-10-CM | POA: Diagnosis present

## 2023-02-24 DIAGNOSIS — Z825 Family history of asthma and other chronic lower respiratory diseases: Secondary | ICD-10-CM

## 2023-02-24 DIAGNOSIS — Z803 Family history of malignant neoplasm of breast: Secondary | ICD-10-CM

## 2023-02-24 DIAGNOSIS — J449 Chronic obstructive pulmonary disease, unspecified: Secondary | ICD-10-CM | POA: Diagnosis present

## 2023-02-24 DIAGNOSIS — Z7985 Long-term (current) use of injectable non-insulin antidiabetic drugs: Secondary | ICD-10-CM

## 2023-02-24 DIAGNOSIS — Z87891 Personal history of nicotine dependence: Secondary | ICD-10-CM

## 2023-02-24 DIAGNOSIS — E1169 Type 2 diabetes mellitus with other specified complication: Secondary | ICD-10-CM

## 2023-02-24 DIAGNOSIS — Z9049 Acquired absence of other specified parts of digestive tract: Secondary | ICD-10-CM

## 2023-02-24 DIAGNOSIS — G629 Polyneuropathy, unspecified: Secondary | ICD-10-CM

## 2023-02-24 DIAGNOSIS — E876 Hypokalemia: Secondary | ICD-10-CM | POA: Diagnosis not present

## 2023-02-24 DIAGNOSIS — Z7982 Long term (current) use of aspirin: Secondary | ICD-10-CM

## 2023-02-24 DIAGNOSIS — Z955 Presence of coronary angioplasty implant and graft: Secondary | ICD-10-CM | POA: Diagnosis not present

## 2023-02-24 DIAGNOSIS — F411 Generalized anxiety disorder: Secondary | ICD-10-CM | POA: Diagnosis present

## 2023-02-24 DIAGNOSIS — Z8673 Personal history of transient ischemic attack (TIA), and cerebral infarction without residual deficits: Secondary | ICD-10-CM | POA: Diagnosis not present

## 2023-02-24 DIAGNOSIS — R600 Localized edema: Secondary | ICD-10-CM | POA: Diagnosis not present

## 2023-02-24 DIAGNOSIS — E785 Hyperlipidemia, unspecified: Secondary | ICD-10-CM | POA: Diagnosis not present

## 2023-02-24 DIAGNOSIS — Z9151 Personal history of suicidal behavior: Secondary | ICD-10-CM

## 2023-02-24 DIAGNOSIS — F319 Bipolar disorder, unspecified: Secondary | ICD-10-CM | POA: Diagnosis not present

## 2023-02-24 LAB — CBC WITH DIFFERENTIAL/PLATELET
Abs Immature Granulocytes: 0.06 10*3/uL (ref 0.00–0.07)
Basophils Absolute: 0.1 10*3/uL (ref 0.0–0.1)
Basophils Relative: 1 %
Eosinophils Absolute: 0.6 10*3/uL — ABNORMAL HIGH (ref 0.0–0.5)
Eosinophils Relative: 5 %
HCT: 43.7 % (ref 36.0–46.0)
Hemoglobin: 13.4 g/dL (ref 12.0–15.0)
Immature Granulocytes: 0 %
Lymphocytes Relative: 16 %
Lymphs Abs: 2.2 10*3/uL (ref 0.7–4.0)
MCH: 29.3 pg (ref 26.0–34.0)
MCHC: 30.7 g/dL (ref 30.0–36.0)
MCV: 95.6 fL (ref 80.0–100.0)
Monocytes Absolute: 0.8 10*3/uL (ref 0.1–1.0)
Monocytes Relative: 6 %
Neutro Abs: 9.7 10*3/uL — ABNORMAL HIGH (ref 1.7–7.7)
Neutrophils Relative %: 72 %
Platelets: 205 10*3/uL (ref 150–400)
RBC: 4.57 MIL/uL (ref 3.87–5.11)
RDW: 16.1 % — ABNORMAL HIGH (ref 11.5–15.5)
WBC: 13.4 10*3/uL — ABNORMAL HIGH (ref 4.0–10.5)
nRBC: 0 % (ref 0.0–0.2)

## 2023-02-24 LAB — BASIC METABOLIC PANEL
Anion gap: 8 (ref 5–15)
BUN: 11 mg/dL (ref 8–23)
CO2: 28 mmol/L (ref 22–32)
Calcium: 8.6 mg/dL — ABNORMAL LOW (ref 8.9–10.3)
Chloride: 103 mmol/L (ref 98–111)
Creatinine, Ser: 0.91 mg/dL (ref 0.44–1.00)
GFR, Estimated: >60 mL/min (ref >60)
Glucose, Bld: 105 mg/dL — ABNORMAL HIGH (ref 70–99)
Potassium: 3.7 mmol/L (ref 3.5–5.1)
Sodium: 139 mmol/L (ref 135–145)

## 2023-02-24 LAB — SARS CORONAVIRUS 2 BY RT PCR: SARS Coronavirus 2 by RT PCR: NEGATIVE

## 2023-02-24 LAB — D-DIMER, QUANTITATIVE: D-Dimer, Quant: 0.53 ug/mL-FEU — ABNORMAL HIGH (ref 0.00–0.50)

## 2023-02-24 LAB — BRAIN NATRIURETIC PEPTIDE: B Natriuretic Peptide: 117.5 pg/mL — ABNORMAL HIGH (ref 0.0–100.0)

## 2023-02-24 LAB — TROPONIN I (HIGH SENSITIVITY): Troponin I (High Sensitivity): 5 ng/L (ref <18)

## 2023-02-24 MED ORDER — ALBUTEROL SULFATE (2.5 MG/3ML) 0.083% IN NEBU
5.0000 mg | INHALATION_SOLUTION | Freq: Once | RESPIRATORY_TRACT | Status: AC
  Administered 2023-02-25: 5 mg via RESPIRATORY_TRACT
  Filled 2023-02-24: qty 6

## 2023-02-24 MED ORDER — FUROSEMIDE 10 MG/ML IJ SOLN
40.0000 mg | Freq: Once | INTRAMUSCULAR | Status: AC
  Administered 2023-02-24: 40 mg via INTRAVENOUS
  Filled 2023-02-24: qty 4

## 2023-02-24 MED ORDER — SODIUM CHLORIDE 0.9 % IV SOLN
500.0000 mg | Freq: Once | INTRAVENOUS | Status: AC
  Administered 2023-02-25: 500 mg via INTRAVENOUS
  Filled 2023-02-24: qty 5

## 2023-02-24 MED ORDER — IPRATROPIUM-ALBUTEROL 0.5-2.5 (3) MG/3ML IN SOLN
3.0000 mL | Freq: Once | RESPIRATORY_TRACT | Status: AC
  Administered 2023-02-24: 3 mL via RESPIRATORY_TRACT
  Filled 2023-02-24: qty 3

## 2023-02-24 MED ORDER — METHYLPREDNISOLONE SODIUM SUCC 125 MG IJ SOLR
125.0000 mg | Freq: Once | INTRAMUSCULAR | Status: AC
  Administered 2023-02-24: 125 mg via INTRAVENOUS
  Filled 2023-02-24: qty 2

## 2023-02-24 MED ORDER — SODIUM CHLORIDE 0.9 % IV SOLN
1.0000 g | Freq: Once | INTRAVENOUS | Status: AC
  Administered 2023-02-25: 1 g via INTRAVENOUS
  Filled 2023-02-24: qty 10

## 2023-02-24 NOTE — ED Triage Notes (Signed)
Patient complains of SOB, having trouble breathing. States it is worse with ambulation and activity. Been going on for weeks but has worsened today. On oxgen 2 liters all the time. Coughing up thick yellowish sputum.

## 2023-02-24 NOTE — ED Provider Notes (Signed)
  Physical Exam  BP (!) 157/94   Pulse 74   Temp 98 F (36.7 C) (Oral)   Resp 20   Ht 5' (1.524 m)   Wt 117.9 kg   LMP 03/08/2013   SpO2 96%   BMI 50.78 kg/m   Physical Exam  Procedures  Ultrasound ED Peripheral IV (Provider)  Date/Time: 02/24/2023 11:10 PM  Performed by: Darrick Grinder, PA-C Authorized by: Darrick Grinder, PA-C   Procedure details:    Indications: poor IV access     Skin Prep: chlorhexidine gluconate     Location:  Left anterior forearm   Angiocath:  20 G   Bedside Ultrasound Guided: Yes     Images: archived     Patient tolerated procedure without complications: Yes     Dressing applied: Yes     ED Course / MDM    Medical Decision Making Amount and/or Complexity of Data Reviewed Labs: ordered. Radiology: ordered.  Risk Prescription drug management.         Brenda [name] 02/24/23 [id number]    Tilden Fossa, MD 02/25/23 [phone]

## 2023-02-24 NOTE — Progress Notes (Signed)
ED staff able to establish PIV access.

## 2023-02-24 NOTE — ED Provider Notes (Signed)
[place] EMERGENCY DEPARTMENT AT [facility] Provider Note   CSN: [id number] Arrival date & time: 02/24/23  2038     History  Chief Complaint  Patient presents with   Shortness of Breath    Brenda [name] is a 62 y.o. female.  Pt is a 62 yo female with pmhx significant for copd, htn, bipolar d/o, cad, chf, and dm2.  Pt said she has been having sob for the past 2 weeks.  She has been coughing up yellow sputum.  Pt has also had some swelling in her legs.  Pt denies fevers.       Home Medications Prior to Admission medications   Medication Sig Start Date End Date Taking? Authorizing Provider  albuterol (PROVENTIL) (2.5 MG/3ML) 0.083% nebulizer solution INHALE 1 VIAL VIA NEBULIZER EVERY 6 HOURS AS NEEDED FOR WHEEZING/SHORTNESS OF BREATH 05/03/22  Yes [name], MD  albuterol (VENTOLIN HFA) 108 (90 Base) MCG/ACT inhaler TAKE 2 PUFFS BY MOUTH EVERY 6 HOURS AS NEEDED FOR WHEEZE OR SHORTNESS OF BREATH 01/19/23  Yes [name], MD  ARIPiprazole (ABILIFY) 10 MG tablet Take 1 tablet (10 mg total) by mouth daily. 10/27/22  Yes [name], MD  aspirin EC 81 MG EC tablet Take 1 tablet (81 mg total) by mouth daily. Swallow whole. 02/05/20  Yes Narda Bonds, MD  atorvastatin (LIPITOR) 80 MG tablet TAKE 1 TABLET BY MOUTH EVERYDAY AT BEDTIME For cholesterol 10/27/22  Yes [name], MD  Budeson-Glycopyrrol-Formoterol (BREZTRI AEROSPHERE) 160-9-4.8 MCG/ACT AERO Inhale 2 puffs into the lungs in the morning and at bedtime. 12/11/22  Yes Nyoka Cowden, MD  cetirizine-pseudoephedrine (ZYRTEC-D) 5-120 MG tablet Take 1 tablet by mouth every evening.   Yes [provider]  Cholecalciferol (VITAMIN D3) 25 MCG (1000 UT) CAPS Take 1 capsule (1,000 Units total) by mouth daily. 10/01/20  Yes [name], MD  dextromethorphan-guaiFENesin [facility] DM) 30-600 MG 12hr tablet Take 1 tablet by mouth 2 (two) times daily as needed for cough.   Yes [provider]  Dulaglutide (TRULICITY) 0.75 MG/0.5ML SOPN Inject 0.75 mg into the skin once a week. 10/27/22  Yes [name], MD  escitalopram (LEXAPRO) 20 MG tablet Take 1 tablet (20 mg total) by mouth daily. 10/27/22  Yes [name], MD  famotidine (PEPCID) 20 MG tablet One after supper Patient taking differently: Take 20 mg by mouth daily. 12/11/22  Yes Nyoka Cowden, MD  fluticasone (FLONASE) 50 MCG/ACT nasal spray PLACE 1 SPRAY INTO BOTH NOSTRILS 2 (TWO) TIMES DAILY 04/27/22  Yes [name], MD  furosemide (LASIX) 20 MG tablet TAKE 1 TABLET BY MOUTH EVERY DAY 01/19/23  Yes [name], MD  gabapentin (NEURONTIN) 300 MG capsule Take 2 capsules (600 mg total) by mouth in the morning AND 3 capsules (900 mg total) at bedtime. 10/27/22  Yes [name], MD  JARDIANCE 10 MG TABS tablet TAKE 1 TABLET BY MOUTH EVERY DAY BEFORE BREAKFAST 06/17/22  Yes O'Neal, Ronnald Ramp, MD  LORazepam (ATIVAN) 0.5 MG tablet TAKE 1/2 TO 1 TABLET BY MOUTH TWICE A DAY AS NEEDED FOR ANXIETY 12/11/22  Yes [name], MD  metoprolol succinate (TOPROL-XL) 25 MG 24 hr tablet Take 1 tablet (25 mg total) by mouth daily. 12/09/22  Yes O'Neal, Ronnald Ramp, MD  Multiple Vitamin (MULTIVITAMIN WITH MINERALS) TABS tablet Take 1 tablet by mouth daily.   Yes [provider]  nitroGLYCERIN (NITROSTAT) 0.4 MG SL tablet Place 1 tablet (0.4 mg total) under the tongue every  5 (five) minutes as needed. 11/04/20  Yes Laverda Page B, NP  pantoprazole (PROTONIX) 40 MG tablet TAKE 1 TABLET BY MOUTH 2 TIMES DAILY 30-60 MIN BEFORE FIRST MEAL OF THE DAY 01/20/23  Yes Glenford Bayley, NP  sacubitril-valsartan (ENTRESTO) 24-26 MG Take 1 tablet by mouth 2 (two) times daily. 12/09/22  Yes O'Neal, Ronnald Ramp, MD  spironolactone (ALDACTONE) 25 MG tablet Take 1 tablet (25 mg total) by mouth daily. 12/09/22  Yes O'Neal, Ronnald Ramp, MD  traMADol (ULTRAM) 50 MG tablet Take 1 tablet (50 mg total) by mouth 2 (two)  times daily as needed for moderate pain. 12/11/22  Yes [name], MD  BREZTRI AEROSPHERE 160-9-4.8 MCG/ACT AERO INHALE 2 PUFFS INTO LUNGS 2 TIMES DAILY Patient not taking: Reported on 02/24/2023 10/23/22   Nyoka Cowden, MD      Allergies    Metolazone    Review of Systems   Review of Systems  Respiratory:  Positive for cough and shortness of breath.   Cardiovascular:  Positive for leg swelling.  All other systems reviewed and are negative.   Physical Exam Updated Vital Signs BP 115/66 (BP Location: Right Arm)   Pulse 67   Temp 98.1 F (36.7 C) (Oral)   Resp 18   Ht 5' (1.524 m)   Wt 124.4 kg   LMP 03/08/2013   SpO2 94%   BMI 53.56 kg/m  Physical Exam Vitals and nursing note reviewed.  Constitutional:      Appearance: She is well-developed.  HENT:     Head: Normocephalic and atraumatic.     Mouth/Throat:     Mouth: Mucous membranes are moist.     Pharynx: Oropharynx is clear.  Eyes:     Extraocular Movements: Extraocular movements intact.     Pupils: Pupils are equal, round, and reactive to light.  Cardiovascular:     Rate and Rhythm: Normal rate and regular rhythm.  Pulmonary:     Effort: Pulmonary effort is normal.     Breath sounds: Wheezing present.  Abdominal:     General: Bowel sounds are normal.     Palpations: Abdomen is soft.  Musculoskeletal:     Cervical back: Normal range of motion and neck supple.     Right lower leg: Edema present.     Left lower leg: Edema present.  Skin:    General: Skin is warm.     Capillary Refill: Capillary refill takes less than 2 seconds.  Neurological:     General: No focal deficit present.     Mental Status: She is alert and oriented to person, place, and time.  Psychiatric:        Mood and Affect: Mood normal.        Behavior: Behavior normal.     ED Results / Procedures / Treatments   Labs (all labs ordered are listed, but only abnormal results are displayed) Labs Reviewed  BASIC METABOLIC PANEL -  Abnormal; Notable for the following components:      Result Value   Glucose, Bld 105 (*)    Calcium 8.6 (*)    All other components within normal limits  BRAIN NATRIURETIC PEPTIDE - Abnormal; Notable for the following components:   B Natriuretic Peptide 117.5 (*)    All other components within normal limits  CBC WITH DIFFERENTIAL/PLATELET - Abnormal; Notable for the following components:   WBC 13.4 (*)    RDW 16.1 (*)    Neutro Abs 9.7 (*)    Eosinophils  Absolute 0.6 (*)    All other components within normal limits  D-DIMER, QUANTITATIVE - Abnormal; Notable for the following components:   D-Dimer, Quant 0.53 (*)    All other components within normal limits  CBC - Abnormal; Notable for the following components:   WBC 13.8 (*)    RDW 15.9 (*)    All other components within normal limits  COMPREHENSIVE METABOLIC PANEL - Abnormal; Notable for the following components:   Potassium 3.3 (*)    Glucose, Bld 214 (*)    Creatinine, Ser 1.09 (*)    Calcium 8.5 (*)    Albumin 3.4 (*)    GFR, Estimated 57 (*)    All other components within normal limits  GLUCOSE, CAPILLARY - Abnormal; Notable for the following components:   Glucose-Capillary 142 (*)    All other components within normal limits  CBC - Abnormal; Notable for the following components:   WBC 15.0 (*)    HCT 47.1 (*)    RDW 16.1 (*)    All other components within normal limits  COMPREHENSIVE METABOLIC PANEL - Abnormal; Notable for the following components:   Chloride 96 (*)    CO2 33 (*)    Glucose, Bld 164 (*)    Creatinine, Ser 1.11 (*)    Albumin 3.4 (*)    GFR, Estimated 56 (*)    All other components within normal limits  GLUCOSE, CAPILLARY - Abnormal; Notable for the following components:   Glucose-Capillary 124 (*)    All other components within normal limits  GLUCOSE, CAPILLARY - Abnormal; Notable for the following components:   Glucose-Capillary 138 (*)    All other components within normal limits  GLUCOSE,  CAPILLARY - Abnormal; Notable for the following components:   Glucose-Capillary 146 (*)    All other components within normal limits  GLUCOSE, CAPILLARY - Abnormal; Notable for the following components:   Glucose-Capillary 132 (*)    All other components within normal limits  GLUCOSE, CAPILLARY - Abnormal; Notable for the following components:   Glucose-Capillary 150 (*)    All other components within normal limits  GLUCOSE, CAPILLARY - Abnormal; Notable for the following components:   Glucose-Capillary 195 (*)    All other components within normal limits  GLUCOSE, CAPILLARY - Abnormal; Notable for the following components:   Glucose-Capillary 193 (*)    All other components within normal limits  GLUCOSE, CAPILLARY - Abnormal; Notable for the following components:   Glucose-Capillary 136 (*)    All other components within normal limits  CBG MONITORING, ED - Abnormal; Notable for the following components:   Glucose-Capillary 194 (*)    All other components within normal limits  CBG MONITORING, ED - Abnormal; Notable for the following components:   Glucose-Capillary 145 (*)    All other components within normal limits  SARS CORONAVIRUS 2 BY RT PCR  HIV ANTIBODY (ROUTINE TESTING W REFLEX)  TROPONIN I (HIGH SENSITIVITY)  TROPONIN I (HIGH SENSITIVITY)    EKG EKG Interpretation Date/Time:  Wednesday February 24 2023 21:41:06 EDT Ventricular Rate:  76 PR Interval:  152 QRS Duration:  90 QT Interval:  410 QTC Calculation: 461 R Axis:   39  Text Interpretation: Sinus rhythm Atrial premature complex Low voltage, precordial leads Borderline T abnormalities, anterior leads No significant change since last tracing Confirmed by Jacalyn Lefevre [phone]) on 02/24/2023 11:28:40 PM  Radiology No results found.  Procedures Procedures    Medications Ordered in ED Medications  aspirin EC tablet 81 mg (  81 mg Oral Given 02/27/23 0934)  atorvastatin (LIPITOR) tablet 80 mg (80 mg Oral Given  02/27/23 0933)  metoprolol succinate (TOPROL-XL) 24 hr tablet 25 mg (25 mg Oral Not Given 02/27/23 0942)  sacubitril-valsartan (ENTRESTO) 24-26 mg per tablet (1 tablet Oral Given 02/27/23 0934)  spironolactone (ALDACTONE) tablet 25 mg (25 mg Oral Given 02/27/23 0934)  ARIPiprazole (ABILIFY) tablet 10 mg (10 mg Oral Given 02/27/23 0934)  escitalopram (LEXAPRO) tablet 20 mg (20 mg Oral Given 02/27/23 0934)  LORazepam (ATIVAN) tablet 0.25 mg (has no administration in time range)  empagliflozin (JARDIANCE) tablet 10 mg (10 mg Oral Given 02/27/23 0934)  pantoprazole (PROTONIX) EC tablet 40 mg (40 mg Oral Given 02/27/23 0934)  gabapentin (NEURONTIN) capsule 600 mg (600 mg Oral Given 02/27/23 0602)    And  gabapentin (NEURONTIN) capsule 900 mg (900 mg Oral Given 02/26/23 2131)  loratadine (CLARITIN) tablet 10 mg (10 mg Oral Given 02/27/23 0934)  dextromethorphan-guaiFENesin (MUCINEX DM) 30-600 MG per 12 hr tablet 1 tablet (1 tablet Oral Given 02/25/23 0241)  azithromycin (ZITHROMAX) tablet 500 mg (500 mg Oral Given 02/27/23 0602)  methylPREDNISolone sodium succinate (SOLU-MEDROL) 40 mg/mL injection 40 mg (40 mg Intravenous Given 02/27/23 0935)  sodium chloride flush (NS) 0.9 % injection 3 mL (3 mLs Intravenous Given 02/27/23 1028)  sodium chloride flush (NS) 0.9 % injection 3 mL (has no administration in time range)  0.9 %  sodium chloride infusion (has no administration in time range)  acetaminophen (TYLENOL) tablet 650 mg (has no administration in time range)    Or  acetaminophen (TYLENOL) suppository 650 mg (has no administration in time range)  ondansetron (ZOFRAN) tablet 4 mg ( Oral See Alternative 02/25/23 0241)    Or  ondansetron (ZOFRAN) injection 4 mg (4 mg Intravenous Given 02/25/23 0241)  bisacodyl (DULCOLAX) EC tablet 5 mg (has no administration in time range)  insulin aspart (novoLOG) injection 0-6 Units ( Subcutaneous Not Given 02/27/23 1221)  insulin aspart (novoLOG) injection 0-5 Units (  Subcutaneous Not Given 02/26/23 2117)  ipratropium-albuterol (DUONEB) 0.5-2.5 (3) MG/3ML nebulizer solution 3 mL (3 mLs Nebulization Given 02/27/23 1356)    And  ipratropium-albuterol (DUONEB) 0.5-2.5 (3) MG/3ML nebulizer solution 3 mL (3 mLs Nebulization Given 02/26/23 2300)  furosemide (LASIX) injection 40 mg (40 mg Intravenous Given 02/27/23 0815)  perflutren lipid microspheres (DEFINITY) IV suspension (4 mLs Intravenous Given 02/25/23 1414)  enoxaparin (LOVENOX) injection 60 mg (60 mg Subcutaneous Given 02/27/23 0937)  traMADol (ULTRAM) tablet 50 mg (has no administration in time range)  furosemide (LASIX) injection 40 mg (40 mg Intravenous Given 02/24/23 2232)  ipratropium-albuterol (DUONEB) 0.5-2.5 (3) MG/3ML nebulizer solution 3 mL (3 mLs Nebulization Given 02/24/23 2148)  methylPREDNISolone sodium succinate (SOLU-MEDROL) 125 mg/2 mL injection 125 mg (125 mg Intravenous Given 02/24/23 2236)  albuterol (PROVENTIL) (2.5 MG/3ML) 0.083% nebulizer solution 5 mg (5 mg Nebulization Given 02/25/23 0019)  cefTRIAXone (ROCEPHIN) 1 g in sodium chloride 0.9 % 100 mL IVPB (0 g Intravenous Stopped 02/25/23 0049)  azithromycin (ZITHROMAX) 500 mg in sodium chloride 0.9 % 250 mL IVPB (0 mg Intravenous Stopped 02/25/23 0234)    ED Course/ Medical Decision Making/ A&P                                 Medical Decision Making Amount and/or Complexity of Data Reviewed Labs: ordered. Radiology: ordered.  Risk Prescription drug management. Decision regarding hospitalization.   This patient presents to  the ED for concern of sob, this involves an extensive number of treatment options, and is a complaint that carries with it a high risk of complications and morbidity.  The differential diagnosis includes pneumonia, covid, copd exac   Co morbidities that complicate the patient evaluation  copd, htn, bipolar d/o, cad, chf, and dm2   Additional history obtained:  Additional history obtained from epic chart  review External records from outside source obtained and reviewed including famly   Lab Tests:  I Ordered, and personally interpreted labs.  The pertinent results include:  covid neg, cbc nl, bmp nl, trop 5, ddimer 0.53 (nl age adjusted), bnp 117.5   Imaging Studies ordered:  I ordered imaging studies including cxr  I independently visualized and interpreted imaging which showed No acute cardiopulmonary process.  I agree with the radiologist interpretation   Cardiac Monitoring:  The patient was maintained on a cardiac monitor.  I personally viewed and interpreted the cardiac monitored which showed an underlying rhythm of: nsr   Medicines ordered and prescription drug management:  I ordered medication including solumedrol/lasix/duoneb  for sx; rocephin/zithromax for cap Reevaluation of the patient after these medicines showed that the patient improved I have reviewed the patients home medicines and have made adjustments as needed  Critical Interventions:  nebs   Consultations Obtained:  I requested consultation with the hospitalist,  and discussed lab and imaging findings as well as pertinent plan - pending call back   Problem List / ED Course:  COPD exac:  pt is still wheezing a lot and sob.  Pt is very sob with just getting out of bed.  She will need admission.   Reevaluation:  After the interventions noted above, I reevaluated the patient and found that they have :improved   Social Determinants of Health:  Lives at home   Dispostion:  After consideration of the diagnostic results and the patients response to treatment, I feel that the patent would benefit from admission.          Final Clinical Impression(s) / ED Diagnoses Final diagnoses:  COPD exacerbation (HCC)  Peripheral edema  Acute bronchitis, unspecified organism    Rx / DC Orders ED Discharge Orders     None         Jacalyn Lefevre, MD 02/27/23 [phone]

## 2023-02-24 NOTE — ED Notes (Signed)
ambulated the patient only dropped to 91% but we weren't able to make it very far and her work of breathing increase almost as soon as we walked out the door

## 2023-02-25 ENCOUNTER — Encounter (HOSPITAL_COMMUNITY): Payer: Self-pay | Admitting: Internal Medicine

## 2023-02-25 ENCOUNTER — Observation Stay (HOSPITAL_COMMUNITY): Payer: 59

## 2023-02-25 ENCOUNTER — Other Ambulatory Visit: Payer: Self-pay

## 2023-02-25 DIAGNOSIS — I130 Hypertensive heart and chronic kidney disease with heart failure and stage 1 through stage 4 chronic kidney disease, or unspecified chronic kidney disease: Secondary | ICD-10-CM | POA: Diagnosis present

## 2023-02-25 DIAGNOSIS — R600 Localized edema: Secondary | ICD-10-CM | POA: Diagnosis present

## 2023-02-25 DIAGNOSIS — J9611 Chronic respiratory failure with hypoxia: Secondary | ICD-10-CM

## 2023-02-25 DIAGNOSIS — I5022 Chronic systolic (congestive) heart failure: Secondary | ICD-10-CM | POA: Diagnosis not present

## 2023-02-25 DIAGNOSIS — I2510 Atherosclerotic heart disease of native coronary artery without angina pectoris: Secondary | ICD-10-CM | POA: Diagnosis present

## 2023-02-25 DIAGNOSIS — Z955 Presence of coronary angioplasty implant and graft: Secondary | ICD-10-CM | POA: Diagnosis not present

## 2023-02-25 DIAGNOSIS — F411 Generalized anxiety disorder: Secondary | ICD-10-CM | POA: Diagnosis present

## 2023-02-25 DIAGNOSIS — I10 Essential (primary) hypertension: Secondary | ICD-10-CM

## 2023-02-25 DIAGNOSIS — J441 Chronic obstructive pulmonary disease with (acute) exacerbation: Secondary | ICD-10-CM | POA: Diagnosis present

## 2023-02-25 DIAGNOSIS — Z1152 Encounter for screening for COVID-19: Secondary | ICD-10-CM | POA: Diagnosis not present

## 2023-02-25 DIAGNOSIS — E1165 Type 2 diabetes mellitus with hyperglycemia: Secondary | ICD-10-CM | POA: Diagnosis present

## 2023-02-25 DIAGNOSIS — E785 Hyperlipidemia, unspecified: Secondary | ICD-10-CM | POA: Diagnosis present

## 2023-02-25 DIAGNOSIS — G6289 Other specified polyneuropathies: Secondary | ICD-10-CM | POA: Diagnosis not present

## 2023-02-25 DIAGNOSIS — E876 Hypokalemia: Secondary | ICD-10-CM | POA: Diagnosis present

## 2023-02-25 DIAGNOSIS — I5031 Acute diastolic (congestive) heart failure: Secondary | ICD-10-CM

## 2023-02-25 DIAGNOSIS — J9621 Acute and chronic respiratory failure with hypoxia: Secondary | ICD-10-CM | POA: Diagnosis present

## 2023-02-25 DIAGNOSIS — Z8249 Family history of ischemic heart disease and other diseases of the circulatory system: Secondary | ICD-10-CM | POA: Diagnosis not present

## 2023-02-25 DIAGNOSIS — Z79899 Other long term (current) drug therapy: Secondary | ICD-10-CM | POA: Diagnosis not present

## 2023-02-25 DIAGNOSIS — Z87891 Personal history of nicotine dependence: Secondary | ICD-10-CM | POA: Diagnosis not present

## 2023-02-25 DIAGNOSIS — N1831 Chronic kidney disease, stage 3a: Secondary | ICD-10-CM | POA: Diagnosis present

## 2023-02-25 DIAGNOSIS — Z9981 Dependence on supplemental oxygen: Secondary | ICD-10-CM | POA: Diagnosis not present

## 2023-02-25 DIAGNOSIS — E119 Type 2 diabetes mellitus without complications: Secondary | ICD-10-CM | POA: Diagnosis not present

## 2023-02-25 DIAGNOSIS — Z8673 Personal history of transient ischemic attack (TIA), and cerebral infarction without residual deficits: Secondary | ICD-10-CM

## 2023-02-25 DIAGNOSIS — Z6841 Body Mass Index (BMI) 40.0 and over, adult: Secondary | ICD-10-CM | POA: Diagnosis not present

## 2023-02-25 DIAGNOSIS — I252 Old myocardial infarction: Secondary | ICD-10-CM | POA: Diagnosis not present

## 2023-02-25 DIAGNOSIS — E1140 Type 2 diabetes mellitus with diabetic neuropathy, unspecified: Secondary | ICD-10-CM | POA: Diagnosis present

## 2023-02-25 DIAGNOSIS — I5043 Acute on chronic combined systolic (congestive) and diastolic (congestive) heart failure: Secondary | ICD-10-CM | POA: Diagnosis present

## 2023-02-25 DIAGNOSIS — E1122 Type 2 diabetes mellitus with diabetic chronic kidney disease: Secondary | ICD-10-CM | POA: Diagnosis present

## 2023-02-25 DIAGNOSIS — G629 Polyneuropathy, unspecified: Secondary | ICD-10-CM

## 2023-02-25 DIAGNOSIS — F319 Bipolar disorder, unspecified: Secondary | ICD-10-CM

## 2023-02-25 DIAGNOSIS — K219 Gastro-esophageal reflux disease without esophagitis: Secondary | ICD-10-CM | POA: Insufficient documentation

## 2023-02-25 DIAGNOSIS — Z7984 Long term (current) use of oral hypoglycemic drugs: Secondary | ICD-10-CM | POA: Diagnosis not present

## 2023-02-25 LAB — ECHOCARDIOGRAM COMPLETE
Area-P 1/2: 3.46 cm2
Height: 60 in
Weight: 4160 oz

## 2023-02-25 LAB — GLUCOSE, CAPILLARY
Glucose-Capillary: 142 mg/dL — ABNORMAL HIGH (ref 70–99)
Glucose-Capillary: 124 mg/dL — ABNORMAL HIGH (ref 70–99)
Glucose-Capillary: 138 mg/dL — ABNORMAL HIGH (ref 70–99)

## 2023-02-25 LAB — COMPREHENSIVE METABOLIC PANEL
ALT: 19 U/L (ref 0–44)
AST: 17 U/L (ref 15–41)
Albumin: 3.4 g/dL — ABNORMAL LOW (ref 3.5–5.0)
Alkaline Phosphatase: 99 U/L (ref 38–126)
Anion gap: 11 (ref 5–15)
BUN: 14 mg/dL (ref 8–23)
CO2: 29 mmol/L (ref 22–32)
Calcium: 8.5 mg/dL — ABNORMAL LOW (ref 8.9–10.3)
Chloride: 98 mmol/L (ref 98–111)
Creatinine, Ser: 1.09 mg/dL — ABNORMAL HIGH (ref 0.44–1.00)
GFR, Estimated: 57 mL/min — ABNORMAL LOW (ref >60)
Glucose, Bld: 214 mg/dL — ABNORMAL HIGH (ref 70–99)
Potassium: 3.3 mmol/L — ABNORMAL LOW (ref 3.5–5.1)
Sodium: 138 mmol/L (ref 135–145)
Total Bilirubin: 0.5 mg/dL (ref 0.3–1.2)
Total Protein: 7.4 g/dL (ref 6.5–8.1)

## 2023-02-25 LAB — HIV ANTIBODY (ROUTINE TESTING W REFLEX): HIV Screen 4th Generation wRfx: NONREACTIVE

## 2023-02-25 LAB — CBC
HCT: 43.3 % (ref 36.0–46.0)
Hemoglobin: 13.3 g/dL (ref 12.0–15.0)
MCH: 28.9 pg (ref 26.0–34.0)
MCHC: 30.7 g/dL (ref 30.0–36.0)
MCV: 94.1 fL (ref 80.0–100.0)
Platelets: 228 10*3/uL (ref 150–400)
RBC: 4.6 MIL/uL (ref 3.87–5.11)
RDW: 15.9 % — ABNORMAL HIGH (ref 11.5–15.5)
WBC: 13.8 10*3/uL — ABNORMAL HIGH (ref 4.0–10.5)
nRBC: 0 % (ref 0.0–0.2)

## 2023-02-25 LAB — CBG MONITORING, ED
Glucose-Capillary: 194 mg/dL — ABNORMAL HIGH (ref 70–99)
Glucose-Capillary: 145 mg/dL — ABNORMAL HIGH (ref 70–99)

## 2023-02-25 LAB — TROPONIN I (HIGH SENSITIVITY): Troponin I (High Sensitivity): 6 ng/L (ref <18)

## 2023-02-25 MED ORDER — SODIUM CHLORIDE 0.9% FLUSH: 3.0000 mL | INTRAVENOUS | Status: DC | PRN

## 2023-02-25 MED ORDER — IPRATROPIUM-ALBUTEROL 0.5-2.5 (3) MG/3ML IN SOLN
3.0000 mL | RESPIRATORY_TRACT | Status: DC | PRN
  Administered 2023-02-25: 3 mL via RESPIRATORY_TRACT
  Filled 2023-02-25: qty 3

## 2023-02-25 MED ORDER — DM-GUAIFENESIN ER 30-600 MG PO TB12
1.0000 | ORAL_TABLET | Freq: Two times a day (BID) | ORAL | Status: DC | PRN
  Administered 2023-02-25 – 2023-02-28 (×2): 1 via ORAL
  Filled 2023-02-25 (×2): qty 1

## 2023-02-25 MED ORDER — PERFLUTREN LIPID MICROSPHERE
1.0000 mL | INTRAVENOUS | Status: AC | PRN
  Administered 2023-02-25: 4 mL via INTRAVENOUS

## 2023-02-25 MED ORDER — METOPROLOL SUCCINATE ER 25 MG PO TB24
25.0000 mg | ORAL_TABLET | Freq: Every day | ORAL | Status: DC
  Administered 2023-02-25 – 2023-02-26 (×2): 25 mg via ORAL
  Filled 2023-02-25 (×5): qty 1

## 2023-02-25 MED ORDER — GABAPENTIN 300 MG PO CAPS
600.0000 mg | ORAL_CAPSULE | Freq: Every morning | ORAL | Status: DC
  Administered 2023-02-25 – 2023-03-02 (×6): 600 mg via ORAL
  Filled 2023-02-25 (×6): qty 2

## 2023-02-25 MED ORDER — SPIRONOLACTONE 25 MG PO TABS
25.0000 mg | ORAL_TABLET | Freq: Every day | ORAL | Status: DC
  Administered 2023-02-25 – 2023-03-02 (×6): 25 mg via ORAL
  Filled 2023-02-25 (×5): qty 1

## 2023-02-25 MED ORDER — BISACODYL 5 MG PO TBEC: 5.0000 mg | DELAYED_RELEASE_TABLET | Freq: Every day | ORAL | Status: DC | PRN

## 2023-02-25 MED ORDER — ACETAMINOPHEN 325 MG PO TABS: 650.0000 mg | ORAL_TABLET | Freq: Four times a day (QID) | ORAL | Status: DC | PRN

## 2023-02-25 MED ORDER — INSULIN ASPART 100 UNIT/ML IJ SOLN
0.0000 [IU] | Freq: Three times a day (TID) | INTRAMUSCULAR | Status: DC
  Administered 2023-02-27 – 2023-03-02 (×7): 1 [IU] via SUBCUTANEOUS
  Filled 2023-02-25: qty 0.06

## 2023-02-25 MED ORDER — EMPAGLIFLOZIN 10 MG PO TABS
10.0000 mg | ORAL_TABLET | Freq: Every day | ORAL | Status: DC
  Administered 2023-02-25 – 2023-03-02 (×6): 10 mg via ORAL
  Filled 2023-02-25 (×6): qty 1

## 2023-02-25 MED ORDER — ALBUTEROL SULFATE (2.5 MG/3ML) 0.083% IN NEBU: 2.5000 mg | INHALATION_SOLUTION | RESPIRATORY_TRACT | Status: DC | PRN

## 2023-02-25 MED ORDER — IPRATROPIUM-ALBUTEROL 0.5-2.5 (3) MG/3ML IN SOLN
3.0000 mL | Freq: Three times a day (TID) | RESPIRATORY_TRACT | Status: DC
  Administered 2023-02-25 – 2023-03-02 (×15): 3 mL via RESPIRATORY_TRACT
  Filled 2023-02-25 (×15): qty 3

## 2023-02-25 MED ORDER — GABAPENTIN 300 MG PO CAPS
900.0000 mg | ORAL_CAPSULE | Freq: Every day | ORAL | Status: DC
  Administered 2023-02-25 – 2023-03-01 (×5): 900 mg via ORAL
  Filled 2023-02-25 (×5): qty 3

## 2023-02-25 MED ORDER — LORAZEPAM 0.5 MG PO TABS: 0.2500 mg | ORAL_TABLET | Freq: Two times a day (BID) | ORAL | Status: DC | PRN

## 2023-02-25 MED ORDER — IPRATROPIUM-ALBUTEROL 0.5-2.5 (3) MG/3ML IN SOLN
3.0000 mL | Freq: Four times a day (QID) | RESPIRATORY_TRACT | Status: DC
  Administered 2023-02-25: 3 mL via RESPIRATORY_TRACT
  Filled 2023-02-25: qty 3

## 2023-02-25 MED ORDER — ARIPIPRAZOLE 5 MG PO TABS
10.0000 mg | ORAL_TABLET | Freq: Every day | ORAL | Status: DC
  Administered 2023-02-25 – 2023-03-02 (×6): 10 mg via ORAL
  Filled 2023-02-25: qty 2
  Filled 2023-02-25: qty 1
  Filled 2023-02-25 (×4): qty 2

## 2023-02-25 MED ORDER — METHYLPREDNISOLONE SODIUM SUCC 40 MG IJ SOLR
40.0000 mg | Freq: Two times a day (BID) | INTRAMUSCULAR | Status: AC
  Administered 2023-02-25 – 2023-02-28 (×8): 40 mg via INTRAVENOUS
  Filled 2023-02-25 (×8): qty 1

## 2023-02-25 MED ORDER — ASPIRIN 81 MG PO TBEC
81.0000 mg | DELAYED_RELEASE_TABLET | Freq: Every day | ORAL | Status: DC
  Administered 2023-02-25 – 2023-03-02 (×6): 81 mg via ORAL
  Filled 2023-02-25 (×5): qty 1

## 2023-02-25 MED ORDER — IPRATROPIUM-ALBUTEROL 0.5-2.5 (3) MG/3ML IN SOLN
3.0000 mL | Freq: Four times a day (QID) | RESPIRATORY_TRACT | Status: DC | PRN
  Administered 2023-02-26 – 2023-02-28 (×3): 3 mL via RESPIRATORY_TRACT
  Filled 2023-02-25 (×2): qty 3

## 2023-02-25 MED ORDER — ONDANSETRON HCL 4 MG PO TABS: 4.0000 mg | ORAL_TABLET | Freq: Four times a day (QID) | ORAL | Status: DC | PRN

## 2023-02-25 MED ORDER — FUROSEMIDE 20 MG PO TABS
20.0000 mg | ORAL_TABLET | Freq: Every day | ORAL | Status: DC
  Administered 2023-02-25: 20 mg via ORAL
  Filled 2023-02-25: qty 1

## 2023-02-25 MED ORDER — LORATADINE 10 MG PO TABS
10.0000 mg | ORAL_TABLET | Freq: Every day | ORAL | Status: DC
  Administered 2023-02-25 – 2023-03-02 (×6): 10 mg via ORAL
  Filled 2023-02-25 (×6): qty 1

## 2023-02-25 MED ORDER — ENOXAPARIN SODIUM 60 MG/0.6ML IJ SOSY
55.0000 mg | PREFILLED_SYRINGE | INTRAMUSCULAR | Status: DC
  Administered 2023-02-25: 55 mg via SUBCUTANEOUS
  Filled 2023-02-25: qty 0.6

## 2023-02-25 MED ORDER — ACETAMINOPHEN 650 MG RE SUPP: 650.0000 mg | Freq: Four times a day (QID) | RECTAL | Status: DC | PRN

## 2023-02-25 MED ORDER — ESCITALOPRAM OXALATE 20 MG PO TABS
20.0000 mg | ORAL_TABLET | Freq: Every day | ORAL | Status: DC
  Administered 2023-02-25 – 2023-03-02 (×6): 20 mg via ORAL
  Filled 2023-02-25 (×5): qty 1
  Filled 2023-02-25: qty 2

## 2023-02-25 MED ORDER — SODIUM CHLORIDE 0.9% FLUSH
3.0000 mL | Freq: Two times a day (BID) | INTRAVENOUS | Status: DC
  Administered 2023-02-25 – 2023-03-02 (×12): 3 mL via INTRAVENOUS

## 2023-02-25 MED ORDER — SODIUM CHLORIDE 0.9 % IV SOLN: 250.0000 mL | INTRAVENOUS | Status: DC | PRN

## 2023-02-25 MED ORDER — PANTOPRAZOLE SODIUM 40 MG PO TBEC
40.0000 mg | DELAYED_RELEASE_TABLET | Freq: Two times a day (BID) | ORAL | Status: DC
  Administered 2023-02-25 – 2023-03-02 (×11): 40 mg via ORAL
  Filled 2023-02-25 (×11): qty 1

## 2023-02-25 MED ORDER — ATORVASTATIN CALCIUM 40 MG PO TABS
80.0000 mg | ORAL_TABLET | Freq: Every day | ORAL | Status: DC
  Administered 2023-02-25 – 2023-03-02 (×6): 80 mg via ORAL
  Filled 2023-02-25 (×6): qty 2

## 2023-02-25 MED ORDER — IPRATROPIUM-ALBUTEROL 0.5-2.5 (3) MG/3ML IN SOLN: 3.0000 mL | Freq: Four times a day (QID) | RESPIRATORY_TRACT | Status: DC

## 2023-02-25 MED ORDER — SACUBITRIL-VALSARTAN 24-26 MG PO TABS
1.0000 | ORAL_TABLET | Freq: Two times a day (BID) | ORAL | Status: DC
  Administered 2023-02-25 – 2023-03-02 (×11): 1 via ORAL
  Filled 2023-02-25 (×11): qty 1

## 2023-02-25 MED ORDER — FUROSEMIDE 10 MG/ML IJ SOLN
40.0000 mg | Freq: Two times a day (BID) | INTRAMUSCULAR | Status: DC
  Administered 2023-02-25 – 2023-03-02 (×11): 40 mg via INTRAVENOUS
  Filled 2023-02-25 (×11): qty 4

## 2023-02-25 MED ORDER — INSULIN ASPART 100 UNIT/ML IJ SOLN
0.0000 [IU] | Freq: Every day | INTRAMUSCULAR | Status: DC
  Administered 2023-02-27 – 2023-02-28 (×2): 2 [IU] via SUBCUTANEOUS
  Filled 2023-02-25: qty 0.05

## 2023-02-25 MED ORDER — AZITHROMYCIN 250 MG PO TABS
500.0000 mg | ORAL_TABLET | Freq: Every day | ORAL | Status: AC
  Administered 2023-02-26 – 2023-03-01 (×4): 500 mg via ORAL
  Filled 2023-02-25 (×4): qty 2

## 2023-02-25 MED ORDER — ONDANSETRON HCL 4 MG/2ML IJ SOLN
4.0000 mg | Freq: Four times a day (QID) | INTRAMUSCULAR | Status: DC | PRN
  Administered 2023-02-25: 4 mg via INTRAVENOUS
  Filled 2023-02-25: qty 2

## 2023-02-25 NOTE — Plan of Care (Signed)
  Problem: Activity: Goal: Ability to tolerate increased activity will improve Outcome: Progressing   Problem: Respiratory: Goal: Ability to maintain a clear airway will improve Outcome: Progressing   Problem: Health Behavior/Discharge Planning: Goal: Ability to manage health-related needs will improve Outcome: Progressing   Problem: Skin Integrity: Goal: Risk for impaired skin integrity will decrease Outcome: Progressing

## 2023-02-25 NOTE — Progress Notes (Signed)
Pt. Refuses cpap  ? ?

## 2023-02-25 NOTE — ED Notes (Signed)
ED TO INPATIENT HANDOFF REPORT  Name/Age/Gender Brenda [name] 62 y.o. female  Code Status    Code Status Orders  (From admission, onward)           Start     Ordered   02/25/23 0034  Full code  Continuous       Question:  By:  Answer:  Consent: discussion documented in EHR   02/25/23 0035           Code Status History     Date Active Date Inactive Code Status Order ID Comments User Context   11/02/2020 1612 11/04/2020 1754 Full Code [id number]  Yvonne Kendall, MD Inpatient   02/13/2020 1121 02/14/2020 1904 Full Code [id number]  Julieanne Cotton, MD Inpatient   02/08/2020 0015 02/13/2020 1120 Full Code [id number]  Briscoe Deutscher, MD ED   01/30/2020 1331 02/04/2020 2100 Full Code [id number]  Emeline General, MD ED   07/05/2017 1604 07/08/2017 1746 Full Code [id number]  Haydee Monica, MD ED   07/20/2015 1606 07/26/2015 1430 Full Code [id number]  Gwenyth Bender, NP ED   05/30/2013 1805 06/02/2013 1635 Full Code [id number]  Drema Dallas, MD Inpatient      Advance Directive Documentation    Flowsheet Row Most Recent Value  Type of Advance Directive Living will, Healthcare Power of Attorney  Pre-existing out of facility DNR order (yellow form or pink MOST form) --  "MOST" Form in Place? --       Home/SNF/Other Home  Chief Complaint COPD exacerbation (HCC) [J44.1]  Level of Care/Admitting Diagnosis ED Disposition     ED Disposition  Admit   Condition  --   Comment  Hospital Area: [facility] [[id number]]  Level of Care: Telemetry [5]  Admit to tele based on following criteria: Other see comments  Comments: Monitor for arrhythmia in the setting of COPD exacerbation  May place patient in observation at [facility] or [facility] if equivalent level of care is available:: No  Covid Evaluation: Confirmed COVID Negative  Diagnosis: COPD exacerbation [facility]) [[id number]]  Admitting Physician: Tereasa Coop [[id number]]  Attending Physician: Tereasa Coop [phone]           Medical History Past Medical History:  Diagnosis Date   Acute pancreatitis    Asthma    main dyspnea thought due to deconditioning (10/2013)   Bipolar I disorder, most recent episode (or current) manic, unspecified    pt denies this   Bronchitis, chronic obstructive 2008 & 2009   FeV1 64% TLC 105% DLCO 55% 2008  -  FeV1 81% FeF 25-75 43% 2009   CAD (coronary artery disease)    inferior STEMI in 10/2020 s/p DES to RCA   Chronic combined systolic and diastolic CHF (congestive heart failure) (HCC)    LVEF 30-35% in 01/2020 but improved to 60-65% in 05/2021   COPD (chronic obstructive pulmonary disease) (HCC)    emphysema, not an issue per pulm (10/2013)   History of pyelonephritis 05/2012   History of ST elevation myocardial infarction (STEMI)    HTN (hypertension)    Hx of migraines    Hyperlipidemia    Knee pain, right    Lumbar disc disease with radiculopathy    multilevel spondylitic changes, scoliosis, and anterolisthesis L4 not thought to currently be surg candidate (Dr. Phoebe Perch, [facility]) (Dr. Ollen Bowl, [facility])   Nicotine addiction    Obesity    RSV (respiratory syncytial virus pneumonia) 01/2020   hospitalization   Suicide attempt (HCC)  06/2001   Swelling of limb    TIA (transient ischemic attack)    Type 2 diabetes mellitus (HCC)    Urge and stress incontinence 07/2012   ([facility])    Allergies Allergies  Allergen Reactions   Metolazone Other (See Comments)    Metabolic mood swings    IV Location/Drains/Wounds Patient Lines/Drains/Airways Status     Active Line/Drains/Airways     Name Placement date Placement time Site Days   Peripheral IV 02/24/23 20 G Anterior;Left Forearm 02/24/23  2226  Forearm  1   Peripheral IV 02/24/23 20 G Anterior;Left;Proximal Forearm 02/24/23  2307  Forearm  1            Labs/Imaging Results for orders placed or performed during the hospital encounter of 02/24/23 (from the past 48 hour(s))  SARS Coronavirus 2 by RT PCR  (hospital order, performed in [facility] hospital lab) *cepheid single result test* Anterior Nasal Swab     Status: None   Collection Time: 02/24/23  9:59 PM   Specimen: Anterior Nasal Swab  Result Value Ref Range   SARS Coronavirus 2 by RT PCR NEGATIVE NEGATIVE    Comment: (NOTE) SARS-CoV-2 target nucleic acids are NOT DETECTED.  The SARS-CoV-2 RNA is generally detectable in upper and lower respiratory specimens during the acute phase of infection. The lowest concentration of SARS-CoV-2 viral copies this assay can detect is 250 copies / mL. A negative result does not preclude SARS-CoV-2 infection and should not be used as the sole basis for treatment or other patient management decisions.  A negative result may occur with improper specimen collection / handling, submission of specimen other than nasopharyngeal swab, presence of viral mutation(s) within the areas targeted by this assay, and inadequate number of viral copies (<250 copies / mL). A negative result must be combined with clinical observations, patient history, and epidemiological information.  Fact Sheet for Patients:   RoadLapTop.co.za  Fact Sheet for Healthcare Providers: http://kim-miller.com/  This test is not yet approved or  cleared by the [place] FDA and has been authorized for detection and/or diagnosis of SARS-CoV-2 by FDA under an Emergency Use Authorization (EUA).  This EUA will remain in effect (meaning this test can be used) for the duration of the COVID-19 declaration under Section 564(b)(1) of the Act, 21 U.S.C. section 360bbb-3(b)(1), unless the authorization is terminated or revoked sooner.  Performed at [facility], [address], [place], [place] [address]   Basic metabolic panel     Status: Abnormal   Collection Time: 02/24/23 10:20 PM  Result Value Ref Range   Sodium 139 135 - 145 mmol/L   Potassium 3.7 3.5 - 5.1 mmol/L    Chloride 103 98 - 111 mmol/L   CO2 28 22 - 32 mmol/L   Glucose, Bld 105 (H) 70 - 99 mg/dL    Comment: Glucose reference range applies only to samples taken after fasting for at least 8 hours.   BUN 11 8 - 23 mg/dL   Creatinine, Ser 1.19 0.44 - 1.00 mg/dL   Calcium 8.6 (L) 8.9 - 10.3 mg/dL   GFR, Estimated >14 >78 mL/min    Comment: (NOTE) Calculated using the CKD-EPI Creatinine Equation (2021)    Anion gap 8 5 - 15    Comment: Performed at [facility], [address], [place], [place] [address]  Brain natriuretic peptide     Status: Abnormal   Collection Time: 02/24/23 10:20 PM  Result Value Ref Range  B Natriuretic Peptide 117.5 (H) 0.0 - 100.0 pg/mL    Comment: Performed at [facility], [address] [place], [place] [address]  CBC with Differential     Status: Abnormal   Collection Time: 02/24/23 10:20 PM  Result Value Ref Range   WBC 13.4 (H) 4.0 - 10.5 K/uL   RBC 4.57 3.87 - 5.11 MIL/uL   Hemoglobin 13.4 12.0 - 15.0 g/dL   HCT 60.4 54.0 - 98.1 %   MCV 95.6 80.0 - 100.0 fL   MCH 29.3 26.0 - 34.0 pg   MCHC 30.7 30.0 - 36.0 g/dL   RDW 19.1 (H) 47.8 - 29.5 %   Platelets 205 150 - 400 K/uL   nRBC 0.0 0.0 - 0.2 %   Neutrophils Relative % 72 %   Neutro Abs 9.7 (H) 1.7 - 7.7 K/uL   Lymphocytes Relative 16 %   Lymphs Abs 2.2 0.7 - 4.0 K/uL   Monocytes Relative 6 %   Monocytes Absolute 0.8 0.1 - 1.0 K/uL   Eosinophils Relative 5 %   Eosinophils Absolute 0.6 (H) 0.0 - 0.5 K/uL   Basophils Relative 1 %   Basophils Absolute 0.1 0.0 - 0.1 K/uL   Immature Granulocytes 0 %   Abs Immature Granulocytes 0.06 0.00 - 0.07 K/uL    Comment: Performed at [facility], [address] [place], [place] [address]  Troponin I (High Sensitivity)     Status: None   Collection Time: 02/24/23 10:20 PM  Result Value Ref Range   Troponin I (High Sensitivity) 5 <18 ng/L    Comment: (NOTE) Elevated high sensitivity troponin I (hsTnI)  values and significant  changes across serial measurements may suggest ACS but many other  chronic and acute conditions are known to elevate hsTnI results.  Refer to the "Links" section for chest pain algorithms and additional  guidance. Performed at [facility], [address], [place], [place] [address]   D-dimer, quantitative     Status: Abnormal   Collection Time: 02/24/23 10:20 PM  Result Value Ref Range   D-Dimer, Quant 0.53 (H) 0.00 - 0.50 ug/mL-FEU    Comment: (NOTE) At the manufacturer cut-off value of 0.5 g/mL FEU, this assay has a negative predictive value of 95-100%.This assay is intended for use in conjunction with a clinical pretest probability (PTP) assessment model to exclude pulmonary embolism (PE) and deep venous thrombosis (DVT) in outpatients suspected of PE or DVT. Results should be correlated with clinical presentation. Performed at [facility], [address] [place], [place] [address]   Troponin I (High Sensitivity)     Status: None   Collection Time: 02/24/23 11:50 PM  Result Value Ref Range   Troponin I (High Sensitivity) 6 <18 ng/L    Comment: (NOTE) Elevated high sensitivity troponin I (hsTnI) values and significant  changes across serial measurements may suggest ACS but many other  chronic and acute conditions are known to elevate hsTnI results.  Refer to the "Links" section for chest pain algorithms and additional  guidance. Performed at [facility], [address], [place], [place] [address]   CBG monitoring, ED     Status: Abnormal   Collection Time: 02/25/23  1:18 AM  Result Value Ref Range   Glucose-Capillary 194 (H) 70 - 99 mg/dL    Comment: Glucose reference range applies only to samples taken after fasting for at least 8 hours.  HIV Antibody (routine testing w rflx)  Status: None   Collection Time: 02/25/23  2:35 AM  Result Value Ref Range   HIV Screen 4th Generation wRfx  Non Reactive Non Reactive    Comment: Performed at [facility], [address], [place], [place] [address]  CBC     Status: Abnormal   Collection Time: 02/25/23  2:55 AM  Result Value Ref Range   WBC 13.8 (H) 4.0 - 10.5 K/uL   RBC 4.60 3.87 - 5.11 MIL/uL   Hemoglobin 13.3 12.0 - 15.0 g/dL   HCT 60.4 54.0 - 98.1 %   MCV 94.1 80.0 - 100.0 fL   MCH 28.9 26.0 - 34.0 pg   MCHC 30.7 30.0 - 36.0 g/dL   RDW 19.1 (H) 47.8 - 29.5 %   Platelets 228 150 - 400 K/uL   nRBC 0.0 0.0 - 0.2 %    Comment: Performed at [facility], [address] [place], [place] [address]  Comprehensive metabolic panel     Status: Abnormal   Collection Time: 02/25/23  2:55 AM  Result Value Ref Range   Sodium 138 135 - 145 mmol/L   Potassium 3.3 (L) 3.5 - 5.1 mmol/L   Chloride 98 98 - 111 mmol/L   CO2 29 22 - 32 mmol/L   Glucose, Bld 214 (H) 70 - 99 mg/dL    Comment: Glucose reference range applies only to samples taken after fasting for at least 8 hours.   BUN 14 8 - 23 mg/dL   Creatinine, Ser 8.65 (H) 0.44 - 1.00 mg/dL   Calcium 8.5 (L) 8.9 - 10.3 mg/dL   Total Protein 7.4 6.5 - 8.1 g/dL   Albumin 3.4 (L) 3.5 - 5.0 g/dL   AST 17 15 - 41 U/L   ALT 19 0 - 44 U/L   Alkaline Phosphatase 99 38 - 126 U/L   Total Bilirubin 0.5 0.3 - 1.2 mg/dL   GFR, Estimated 57 (L) >60 mL/min    Comment: (NOTE) Calculated using the CKD-EPI Creatinine Equation (2021)    Anion gap 11 5 - 15    Comment: Performed at [facility], [address] [place], [place] [address]  CBG monitoring, ED     Status: Abnormal   Collection Time: 02/25/23  8:49 AM  Result Value Ref Range   Glucose-Capillary 145 (H) 70 - 99 mg/dL    Comment: Glucose reference range applies only to samples taken after fasting for at least 8 hours.   DG Chest 2 View  Result Date: 02/24/2023 CLINICAL DATA:  Shortness of breath. EXAM: CHEST - 2 VIEW COMPARISON:  10/14/2022. FINDINGS: There are patchy atelectatic  changes/scarring at the left lung base. Bilateral lung fields are otherwise clear. Bilateral costophrenic angles are clear. Stable cardio-mediastinal silhouette. Presumed loop recorder device noted overlying the left anterior chest wall. No acute osseous abnormalities. The soft tissues are within normal limits. IMPRESSION: No acute cardiopulmonary process. Electronically Signed   By: Jules Schick M.D.   On: 02/24/2023 21:36    Pending Labs Unresulted Labs (From admission, onward)     Start     Ordered   02/25/23 0500  CBC  Daily,   R      02/25/23 0036   02/25/23 0500  Comprehensive metabolic panel  Daily,   R      02/25/23 0036            Vitals/Pain Today's Vitals   02/25/23 0433 02/25/23 0645 02/25/23 0656 02/25/23 0815  BP:  108/83  121/70  Pulse:  88  94  Resp:  19  15  Temp: 97.9 F (36.6 C)   97.8 F (36.6 C)  TempSrc: Oral   Oral  SpO2:  95%  95%  Weight:      Height:      PainSc:   6      Isolation Precautions No active isolations  Medications Medications  aspirin EC tablet 81 mg (81 mg Oral Given 02/25/23 0928)  atorvastatin (LIPITOR) tablet 80 mg (80 mg Oral Given by Other 02/25/23 0927)  furosemide (LASIX) tablet 20 mg (20 mg Oral Given 02/25/23 0928)  metoprolol succinate (TOPROL-XL) 24 hr tablet 25 mg (25 mg Oral Given 02/25/23 0928)  sacubitril-valsartan (ENTRESTO) 24-26 mg per tablet (1 tablet Oral Given 02/25/23 0932)  spironolactone (ALDACTONE) tablet 25 mg (25 mg Oral Given 02/25/23 0932)  ARIPiprazole (ABILIFY) tablet 10 mg (10 mg Oral Given 02/25/23 0928)  escitalopram (LEXAPRO) tablet 20 mg (20 mg Oral Given by Other 02/25/23 0926)  LORazepam (ATIVAN) tablet 0.25 mg (has no administration in time range)  empagliflozin (JARDIANCE) tablet 10 mg (10 mg Oral Given 02/25/23 0932)  pantoprazole (PROTONIX) EC tablet 40 mg (40 mg Oral Given 02/25/23 0928)  gabapentin (NEURONTIN) capsule 600 mg (600 mg Oral Given 02/25/23 0655)    And  gabapentin (NEURONTIN)  capsule 900 mg (has no administration in time range)  loratadine (CLARITIN) tablet 10 mg (10 mg Oral Given 02/25/23 0929)  dextromethorphan-guaiFENesin (MUCINEX DM) 30-600 MG per 12 hr tablet 1 tablet (1 tablet Oral Given 02/25/23 0241)  azithromycin (ZITHROMAX) tablet 500 mg (has no administration in time range)  methylPREDNISolone sodium succinate (SOLU-MEDROL) 40 mg/mL injection 40 mg (40 mg Intravenous Given 02/25/23 0930)  enoxaparin (LOVENOX) injection 55 mg (55 mg Subcutaneous Given 02/25/23 0929)  sodium chloride flush (NS) 0.9 % injection 3 mL (3 mLs Intravenous Given 02/25/23 0940)  sodium chloride flush (NS) 0.9 % injection 3 mL (has no administration in time range)  0.9 %  sodium chloride infusion (has no administration in time range)  acetaminophen (TYLENOL) tablet 650 mg (has no administration in time range)    Or  acetaminophen (TYLENOL) suppository 650 mg (has no administration in time range)  ondansetron (ZOFRAN) tablet 4 mg ( Oral See Alternative 02/25/23 0241)    Or  ondansetron (ZOFRAN) injection 4 mg (4 mg Intravenous Given 02/25/23 0241)  bisacodyl (DULCOLAX) EC tablet 5 mg (has no administration in time range)  insulin aspart (novoLOG) injection 0-6 Units ( Subcutaneous Not Given 02/25/23 0908)  insulin aspart (novoLOG) injection 0-5 Units ( Subcutaneous Not Given 02/25/23 0119)  ipratropium-albuterol (DUONEB) 0.5-2.5 (3) MG/3ML nebulizer solution 3 mL (3 mLs Nebulization Not Given 02/25/23 0833)    And  ipratropium-albuterol (DUONEB) 0.5-2.5 (3) MG/3ML nebulizer solution 3 mL (has no administration in time range)  furosemide (LASIX) injection 40 mg (40 mg Intravenous Given 02/24/23 2232)  ipratropium-albuterol (DUONEB) 0.5-2.5 (3) MG/3ML nebulizer solution 3 mL (3 mLs Nebulization Given 02/24/23 2148)  methylPREDNISolone sodium succinate (SOLU-MEDROL) 125 mg/2 mL injection 125 mg (125 mg Intravenous Given 02/24/23 2236)  albuterol (PROVENTIL) (2.5 MG/3ML) 0.083% nebulizer solution  5 mg (5 mg Nebulization Given 02/25/23 0019)  cefTRIAXone (ROCEPHIN) 1 g in sodium chloride 0.9 % 100 mL IVPB (0 g Intravenous Stopped 02/25/23 0049)  azithromycin (ZITHROMAX) 500 mg in sodium chloride 0.9 % 250 mL IVPB (0 mg Intravenous Stopped 02/25/23 0234)    Mobility walks

## 2023-02-25 NOTE — ED Notes (Signed)
Pt stated she feels nauseous, sitting up in the bed paramedic advised

## 2023-02-25 NOTE — Progress Notes (Signed)
  Patient reported history of obstructive sleep apnea and use CPAP at bedtime. - Ordered CPAP nightly.   Tereasa Coop, MD [facility] 02/25/2023, 2:38 AM

## 2023-02-25 NOTE — H&P (Signed)
History and Physical    Brenda [name]:[id number] DOB: May 06, 1961 DOA: 02/24/2023  PCP: [name], MD   Patient coming from: Home   Chief Complaint:  Chief Complaint  Patient presents with   Shortness of Breath    HPI:  Brenda [name] is a 62 y.o. female with medical history significant of CAD, chronic systolic heart failure, COPD, chronic hypoxic respiratory failure 2 L oxygen at baseline, essential hypertension, DM type II, hyperlipidemia, cryptogenic CVA and bipolar disorder presented to emergency department for evaluation of shortness of breath and cough for 2 weeks.  Patient reported that shortness of breath getting worse with ambulation and activity.  She is also reporting about cough with yellowish sputum production.  Patient denies any fever and chills.  Patient denies any chest pain, palpitation, headache, abdominal pain nausea and vomiting.  Patient also has chronic bilateral lower extremities lymphedema due to body habitus.  Quit smoking many years ago patient reported.  And she is being compliant with her heart failure medications at home.  Per chart review of the cardiology note from 12/09/2022 patient had history of chronic systolic heart failure and EF has been improved 30 to 35% since 01/2020 to 60 to 65% in December 2022 with GDMT therapy.  Patient has been on Entresto, Toprol-XL, Aldactone, Jardiance and Lasix.   ED Course:  At ED initial presentation heart rate 74, respiratory 20, blood pressure 157/94 and O2 sat 96% room air. COVID PCR negative BMP grossly unremarkable except slight elevated blood glucose 105 and low calcium 8.6. Elevated BNP 117.5. CBC showing a leukocytosis 13.4 otherwise unremarkable. Troponin within normal range. Elevated D-dimer 0.53.  Adjusted D-dimer with age within normal range. Chest x-ray no acute cardiopulmonary process.  With the concern for COPD conservation in the ER patient has been treated with DuoNeb nebulizer, albuterol  nebulizer, methylprednisolone 125 mg once, azithromycin and ceftriaxone.  Also patient received Lasix 40 mg IV once.  Review of Systems:  Review of Systems  Constitutional:  Negative for chills, fever, malaise/fatigue and weight loss.  Respiratory:  Positive for cough, sputum production, shortness of breath and wheezing.   Cardiovascular:  Positive for orthopnea and leg swelling. Negative for chest pain and palpitations.  Gastrointestinal:  Negative for abdominal pain, heartburn, nausea and vomiting.  Musculoskeletal:  Negative for joint pain and myalgias.  Skin:  Negative for itching and rash.  Neurological:  Negative for dizziness and headaches.  Psychiatric/Behavioral:  The patient is not nervous/anxious.     Past Medical History:  Diagnosis Date   Acute pancreatitis    Asthma    main dyspnea thought due to deconditioning (10/2013)   Bipolar I disorder, most recent episode (or current) manic, unspecified    pt denies this   Bronchitis, chronic obstructive 2008 & 2009   FeV1 64% TLC 105% DLCO 55% 2008  -  FeV1 81% FeF 25-75 43% 2009   CAD (coronary artery disease)    inferior STEMI in 10/2020 s/p DES to RCA   Chronic combined systolic and diastolic CHF (congestive heart failure) (HCC)    LVEF 30-35% in 01/2020 but improved to 60-65% in 05/2021   COPD (chronic obstructive pulmonary disease) (HCC)    emphysema, not an issue per pulm (10/2013)   History of pyelonephritis 05/2012   History of ST elevation myocardial infarction (STEMI)    HTN (hypertension)    Hx of migraines    Hyperlipidemia    Knee pain, right    Lumbar disc disease with  radiculopathy    multilevel spondylitic changes, scoliosis, and anterolisthesis L4 not thought to currently be surg candidate (Dr. Phoebe Perch, [facility]) (Dr. Ollen Bowl, [place])   Nicotine addiction    Obesity    RSV (respiratory syncytial virus pneumonia) 01/2020   hospitalization   Suicide attempt (HCC) 06/2001   Swelling of limb    TIA (transient  ischemic attack)    Type 2 diabetes mellitus (HCC)    Urge and stress incontinence 07/2012   ([facility])    Past Surgical History:  Procedure Laterality Date   BUBBLE STUDY  04/02/2020   Procedure: BUBBLE STUDY;  Surgeon: Vesta Mixer, MD;  Location: [facility] ENDOSCOPY;  Service: Cardiovascular;;   CHOLECYSTECTOMY  07/1982   CORONARY/GRAFT ACUTE MI REVASCULARIZATION N/A 11/02/2020   Procedure: Coronary/Graft Acute MI Revascularization;  Surgeon: Yvonne Kendall, MD;  Location: [facility] INVASIVE CV LAB;  Service: Cardiovascular;  Laterality: N/A;   CT MAXILLOFACIAL WO/W CM  06/14/06   Left max mucocele   CT of chest  06/14/2006   Normal except c/w active inflammation / infection.  Left renal atrophy   ERCP / sphincterotomy - stenosis  01/15/06   IR ANGIO INTRA EXTRACRAN SEL COM CAROTID INNOMINATE BILAT MOD SED  02/09/2020   IR ANGIO VERTEBRAL SEL SUBCLAVIAN INNOMINATE UNI L MOD SED  02/09/2020   IR ANGIO VERTEBRAL SEL VERTEBRAL UNI R MOD SED  02/09/2020   IR ANGIO VERTEBRAL SEL VERTEBRAL UNI R MOD SED  02/13/2020   IR US GUIDE VASC ACCESS RIGHT  02/09/2020   IR US GUIDE VASC ACCESS RIGHT  02/13/2020   LEFT HEART CATH AND CORONARY ANGIOGRAPHY N/A 11/02/2020   Procedure: LEFT HEART CATH AND CORONARY ANGIOGRAPHY;  Surgeon: Yvonne Kendall, MD;  Location: [facility] INVASIVE CV LAB;  Service: Cardiovascular;  Laterality: N/A;   LOOP RECORDER INSERTION N/A 04/02/2020   Procedure: LOOP RECORDER INSERTION;  Surgeon: Marinus Maw, MD;  Location: [facility] INVASIVE CV LAB;  Service: Cardiovascular;  Laterality: N/A;   lumbar MRI  11/2010   multilevel spondylitic changes with upper lumbar scoliosis and anterolisthesis of L4 on 5 with some L foraminal narrowing esp at L/3 with concavity of scoliosis, some central stenosis and biforaminal narrowing at L4/5 with spondylolisthesis    - Pneumonia, acute asthma, left maxillary sinusitis  01/11 - 06/18/2006   RADIOLOGY WITH ANESTHESIA N/A 02/13/2020   Procedure: Carotid  Stent;  Surgeon: Julieanne Cotton, MD;  Location: [facility] OR;  Service: Radiology;  Laterality: N/A;   Retained stone     ERCP secondary to retained stone   Right knee surgery  1984,1989,1989,1991,1994   Reconstructions for ACL insuff   TEE WITHOUT CARDIOVERSION N/A 04/02/2020   Procedure: TRANSESOPHAGEAL ECHOCARDIOGRAM (TEE);  Surgeon: Elease Hashimoto Deloris Ping, MD;  Location[facility] ENDOSCOPY;  Service: Cardiovascular;  Laterality: N/A;   TUBAL LIGATION       reports that she quit smoking about 16 months ago. Her smoking use included cigarettes. She started smoking about 42 years ago. She has a 10.3 pack-year smoking history. She has been exposed to tobacco smoke. She has never used smokeless tobacco. She reports current alcohol use. She reports that she does not currently use drugs after having used the following drugs: Marijuana.  Allergies  Allergen Reactions   Metolazone Other (See Comments)    Metabolic mood swings    Family History  Problem Relation Age of Onset   Heart disease Brother        MI in early 30's   Hypertension Mother  Hyperlipidemia Mother    Heart disease Mother    Hypertension Father    Asthma Father    Breast cancer Paternal Grandmother    Breast cancer Paternal Aunt     Prior to Admission medications   Medication Sig Start Date End Date Taking? Authorizing Provider  albuterol (PROVENTIL) (2.5 MG/3ML) 0.083% nebulizer solution INHALE 1 VIAL VIA NEBULIZER EVERY 6 HOURS AS NEEDED FOR WHEEZING/SHORTNESS OF BREATH 05/03/22  Yes [name], MD  albuterol (VENTOLIN HFA) 108 (90 Base) MCG/ACT inhaler TAKE 2 PUFFS BY MOUTH EVERY 6 HOURS AS NEEDED FOR WHEEZE OR SHORTNESS OF BREATH 01/19/23  Yes [name], MD  ARIPiprazole (ABILIFY) 10 MG tablet Take 1 tablet (10 mg total) by mouth daily. 10/27/22  Yes [name], MD  aspirin EC 81 MG EC tablet Take 1 tablet (81 mg total) by mouth daily. Swallow whole. 02/05/20  Yes Narda Bonds, MD  atorvastatin (LIPITOR) 80  MG tablet TAKE 1 TABLET BY MOUTH EVERYDAY AT BEDTIME For cholesterol 10/27/22  Yes [name], MD  Budeson-Glycopyrrol-Formoterol (BREZTRI AEROSPHERE) 160-9-4.8 MCG/ACT AERO Inhale 2 puffs into the lungs in the morning and at bedtime. 12/11/22  Yes Nyoka Cowden, MD  cetirizine-pseudoephedrine (ZYRTEC-D) 5-120 MG tablet Take 1 tablet by mouth every evening.   Yes [provider]  Cholecalciferol (VITAMIN D3) 25 MCG (1000 UT) CAPS Take 1 capsule (1,000 Units total) by mouth daily. 10/01/20  Yes [name], MD  dextromethorphan-guaiFENesin [facility] DM) 30-600 MG 12hr tablet Take 1 tablet by mouth 2 (two) times daily as needed for cough.   Yes [provider]  Dulaglutide (TRULICITY) 0.75 MG/0.5ML SOPN Inject 0.75 mg into the skin once a week. 10/27/22  Yes [name], MD  escitalopram (LEXAPRO) 20 MG tablet Take 1 tablet (20 mg total) by mouth daily. 10/27/22  Yes [name], MD  famotidine (PEPCID) 20 MG tablet One after supper Patient taking differently: Take 20 mg by mouth daily. 12/11/22  Yes Nyoka Cowden, MD  fluticasone (FLONASE) 50 MCG/ACT nasal spray PLACE 1 SPRAY INTO BOTH NOSTRILS 2 (TWO) TIMES DAILY 04/27/22  Yes [name], MD  furosemide (LASIX) 20 MG tablet TAKE 1 TABLET BY MOUTH EVERY DAY 01/19/23  Yes [name], MD  gabapentin (NEURONTIN) 300 MG capsule Take 2 capsules (600 mg total) by mouth in the morning AND 3 capsules (900 mg total) at bedtime. 10/27/22  Yes [name], MD  JARDIANCE 10 MG TABS tablet TAKE 1 TABLET BY MOUTH EVERY DAY BEFORE BREAKFAST 06/17/22  Yes O'Neal, Ronnald Ramp, MD  LORazepam (ATIVAN) 0.5 MG tablet TAKE 1/2 TO 1 TABLET BY MOUTH TWICE A DAY AS NEEDED FOR ANXIETY 12/11/22  Yes [name], MD  metoprolol succinate (TOPROL-XL) 25 MG 24 hr tablet Take 1 tablet (25 mg total) by mouth daily. 12/09/22  Yes O'Neal, Ronnald Ramp, MD  Multiple Vitamin (MULTIVITAMIN WITH MINERALS) TABS tablet Take 1  tablet by mouth daily.   Yes [provider]  nitroGLYCERIN (NITROSTAT) 0.4 MG SL tablet Place 1 tablet (0.4 mg total) under the tongue every 5 (five) minutes as needed. 11/04/20  Yes Laverda Page B, NP  pantoprazole (PROTONIX) 40 MG tablet TAKE 1 TABLET BY MOUTH 2 TIMES DAILY 30-60 MIN BEFORE FIRST MEAL OF THE DAY 01/20/23  Yes Glenford Bayley, NP  sacubitril-valsartan (ENTRESTO) 24-26 MG Take 1 tablet by mouth 2 (two) times daily. 12/09/22  Yes O'Neal, Ronnald Ramp, MD  spironolactone (ALDACTONE) 25 MG tablet Take 1 tablet (25 mg total)  by mouth daily. 12/09/22  Yes O'Neal, Ronnald Ramp, MD  traMADol (ULTRAM) 50 MG tablet Take 1 tablet (50 mg total) by mouth 2 (two) times daily as needed for moderate pain. 12/11/22  Yes [name], MD  BREZTRI AEROSPHERE 160-9-4.8 MCG/ACT AERO INHALE 2 PUFFS INTO LUNGS 2 TIMES DAILY Patient not taking: Reported on 02/24/2023 10/23/22   Nyoka Cowden, MD     Physical Exam: Vitals:   02/24/23 2044 02/24/23 2046 02/24/23 2345 02/25/23 0036  BP: (!) 157/94  131/72   Pulse: 74  81   Resp: 20  20   Temp: 98 F (36.7 C)   97.9 F (36.6 C)  TempSrc: Oral   Oral  SpO2: 96%  95%   Weight:  117.9 kg    Height:  5' (1.524 m)      Physical Exam Constitutional:      General: She is not in acute distress.    Appearance: She is obese. She is not ill-appearing.  Cardiovascular:     Rate and Rhythm: Normal rate and regular rhythm.     Heart sounds: No murmur heard. Pulmonary:     Effort: Pulmonary effort is normal. No tachypnea or bradypnea.     Breath sounds: Examination of the right-upper field reveals wheezing. Examination of the left-upper field reveals wheezing. Examination of the right-middle field reveals wheezing. Examination of the left-middle field reveals wheezing. Examination of the right-lower field reveals wheezing. Examination of the left-lower field reveals wheezing. Wheezing present. No decreased breath sounds, rhonchi or rales.   Musculoskeletal:     Right lower leg: Edema present.     Left lower leg: Edema present.  Skin:    General: Skin is warm.     Capillary Refill: Capillary refill takes less than 2 seconds.  Neurological:     Mental Status: She is alert and oriented to person, place, and time.  Psychiatric:        Mood and Affect: Mood normal. Mood is not anxious.      Labs on Admission: I have personally reviewed following labs and imaging studies  CBC: Recent Labs  Lab 02/24/23 2220  WBC 13.4*  NEUTROABS 9.7*  HGB 13.4  HCT 43.7  MCV 95.6  PLT 205   Basic Metabolic Panel: Recent Labs  Lab 02/24/23 2220  NA 139  K 3.7  CL 103  CO2 28  GLUCOSE 105*  BUN 11  CREATININE 0.91  CALCIUM 8.6*   GFR: Estimated Creatinine Clearance: 73.5 mL/min (by C-G formula based on SCr of 0.91 mg/dL). Liver Function Tests: No results for input(s): "AST", "ALT", "ALKPHOS", "BILITOT", "PROT", "ALBUMIN" in the last 168 hours. No results for input(s): "LIPASE", "AMYLASE" in the last 168 hours. No results for input(s): "AMMONIA" in the last 168 hours. Coagulation Profile: No results for input(s): "INR", "PROTIME" in the last 168 hours. Cardiac Enzymes: Recent Labs  Lab 02/24/23 2220 02/24/23 2350  TROPONINIHS 5 6   BNP (last 3 results) Recent Labs    02/24/23 2220  BNP 117.5*   HbA1C: No results for input(s): "HGBA1C" in the last 72 hours. CBG: No results for input(s): "GLUCAP" in the last 168 hours. Lipid Profile: No results for input(s): "CHOL", "HDL", "LDLCALC", "TRIG", "CHOLHDL", "LDLDIRECT" in the last 72 hours. Thyroid Function Tests: No results for input(s): "TSH", "T4TOTAL", "FREET4", "T3FREE", "THYROIDAB" in the last 72 hours. Anemia Panel: No results for input(s): "VITAMINB12", "FOLATE", "FERRITIN", "TIBC", "IRON", "RETICCTPCT" in the last 72 hours. Urine analysis:  Component Value Date/Time   COLORURINE YELLOW 02/08/2020 0919   APPEARANCEUR CLEAR 02/08/2020 0919   LABSPEC  >1.046 (H) 02/08/2020 0919   PHURINE 6.0 02/08/2020 0919   GLUCOSEU NEGATIVE 02/08/2020 0919   HGBUR NEGATIVE 02/08/2020 0919   HGBUR negative 02/22/2008 1545   BILIRUBINUR NEGATIVE 02/08/2020 0919   BILIRUBINUR negative 01/12/2017 1150   KETONESUR NEGATIVE 02/08/2020 0919   PROTEINUR NEGATIVE 02/08/2020 0919   UROBILINOGEN 0.2 01/12/2017 1150   UROBILINOGEN 0.2 02/24/2010 0953   NITRITE NEGATIVE 02/08/2020 0919   LEUKOCYTESUR NEGATIVE 02/08/2020 0919    Radiological Exams on Admission: I have personally reviewed images DG Chest 2 View  Result Date: 02/24/2023 CLINICAL DATA:  Shortness of breath. EXAM: CHEST - 2 VIEW COMPARISON:  10/14/2022. FINDINGS: There are patchy atelectatic changes/scarring at the left lung base. Bilateral lung fields are otherwise clear. Bilateral costophrenic angles are clear. Stable cardio-mediastinal silhouette. Presumed loop recorder device noted overlying the left anterior chest wall. No acute osseous abnormalities. The soft tissues are within normal limits. IMPRESSION: No acute cardiopulmonary process. Electronically Signed   By: Jules Schick M.D.   On: 02/24/2023 21:36    EKG: My personal interpretation of EKG shows:EKG showed normal sinus rhythm heart rate 76.  Premature atrial complex.  No ST anterior abnormality     Assessment/Plan: Principal Problem:   COPD exacerbation (HCC) Active Problems:   Chronic hypoxic respiratory failure (HCC)   COPD with acute exacerbation (HCC)   Bipolar disorder (HCC)   Essential hypertension   Non-insulin dependent type 2 diabetes mellitus (HCC)   Chronic systolic CHF (congestive heart failure) (HCC)   Hyperlipidemia   History of CVA (cerebrovascular accident)   Peripheral neuropathy   GERD (gastroesophageal reflux disease)    Assessment and Plan: COPD exacerbation Chronic hypoxic respiratory failure 2 L oxygen at baseline -Patient presenting with productive cough and shortness of breath on exertion as well  as at rest over the course of last 2 weeks.  Patient denies any fever and chill.  Patient denies any smoking. - At ER on presentation patient is hemodynamically stable.  O2 sat 96% on 2 L oxygen. -Chest x-ray unremarkable. - With concern for severe exacerbation in the ED patient has been treated with nebulizer, Solu-Medrol, azithromycin and ceftriaxone. -Admitting patient for management of COPD exacerbation. - COVID PCR negative. -Unclear etiology of COPD exacerbation. No suspicion for pneumonia at this time. - Plan to continue DuoNeb nebulizer every 6 hours scheduled and every 4 hour. - Continue methylprednisone 40 mg twice daily for 4 days. -Continue loratadine. - Continue Mucinex twice daily as needed - Continue spirometry and flutter valve. - Continue supplemental oxygen to keep O2 sat above 92%. -Continue cardiac monitoring  Essential hypertension History of systolic heart failure -Per chart review patient's ejection fraction has been improved to 60 to 65% in December 2022. -Troponin within normal range.  EKG finding unremarkable. - Slight elevated BNP 117 likely secondary to use of Entresto at home. - Chest x-ray unremarkable. -In the ED patient has been treated with Lasix 40 mg IV once as she was complaining about lower extremity edema.  During my evaluation patient reported that she has chronic lymphedema of the bilateral lower extremities due to body habitus. - No suspicion for heart failure preservation at this time. - Continue home medications Entresto twice daily, Aldactone 25 mg daily, Toprol-XL 25 mg daily, Lasix 20 mg daily and Jardiance 10 mg daily.   Non-insulin-dependent DM type II -Per chart review A1c 6.4 in May  2024 - Continue Jardiance.. - Continue POC blood glucose check, sliding scale insulin and at bedtime insulin as needed.  Starting sliding scale SSI as treating patient with IV steroid.   Bipolar disorder Generalized anxiety disorder -Continue Abilify 10  mg daily and Ativan as needed  Hyperlipidemia -Continue Lipitor 80 mg daily.  History of cryptogenic CVA -Continue aspirin and Lipitor  Peripheral neuropathy -Continue gabapentin 600 mg in the morning and 900 mg at bedtime  GERD -Protonix 40 mg twice daily  DVT prophylaxis:  Lovenox Code Status:  Full Code Diet: Heart healthy carb modified diet Family Communication: Patient's family member at the bedside updated Disposition Plan: Pending improvement of COPD exacerbation.  Tentative discharge to home next 2 days. Consults: None at this time Admission status:   Observation, Telemetry bed  Severity of Illness: The appropriate patient status for this patient is OBSERVATION. Observation status is judged to be reasonable and necessary in order to provide the required intensity of service to ensure the patient's safety. The patient's presenting symptoms, physical exam findings, and initial radiographic and laboratory data in the context of their medical condition is felt to place them at decreased risk for further clinical deterioration. Furthermore, it is anticipated that the patient will be medically stable for discharge from the hospital within 2 midnights of admission.     Tereasa Coop, MD [facility]  How to contact the [facility] Attending or Consulting provider 7A - 7P or covering provider during after hours 7P -7A, for this patient.  Check the care team in [facility] and look for a) attending/consulting [facility] provider listed and b) the [facility] team listed Log into [email or web address] and use [place]'s universal password to access. If you do not have the password, please contact the hospital operator. Locate the [facility] provider you are looking for under [facility] and page to a number that you can be directly reached. If you still have difficulty reaching the provider, please page the [facility] (Director on Call) for the Hospitalists listed on amion for assistance.  02/25/2023, 1:18 AM

## 2023-02-25 NOTE — Progress Notes (Signed)
Echocardiogram 2D Echocardiogram has been performed.  Warren Lacy Quintell Bonnin RDCS 02/25/2023, 2:15 PM

## 2023-02-25 NOTE — Progress Notes (Signed)
This Clinical research associate spoke to patient regarding flutter valve and cpap.  Pt stated she has a flutter valve at home and doesn't want another one.  Pt also refused cpap at this time.

## 2023-02-25 NOTE — Progress Notes (Signed)
This is a 62 year old pleasant lady with a history of CAD, systolic CHF, COPD, chronic hypoxic respiratory failure dependent on 2 L of oxygen at baseline, essential hypertension and many other comorbidities who was admitted after midnight under hospitalist service for acute COPD exacerbation and chronic hypoxic respiratory failure.  Started on steroids and bronchodilators.  Patient seen and examined in the ED this morning.  She said that her breathing is slightly better.  When I saw her, she was on 4 L of oxygen.  This makes a diagnosis of acute on chronic hypoxic respiratory failure.  She had very diminished breath sounds with pan expiratory and prolonged wheezes on auscultation.  Abdomen nontender.  She also had +3 pitting edema bilateral lower extremity and according to patient she typically has only mild edema.  Her chest x-ray official read shows unremarkable but per my personal review, I do think that she has some vascular congestion.  Her BNP is also elevated from her baseline.  She did receive 1 dose of IV Lasix 40 mg in the ED yesterday.  She is currently on PTA dose of 20 mg p.o. daily.  She has already received that.  I will start her back on Lasix 40 mg IV twice daily to diurese.  I do think that she has acute on on chronic congestive heart failure, unidentified type because the last echo was done in 2022 and it showed normal ejection fraction.  I will order repeat echo as well.

## 2023-02-25 NOTE — ED Notes (Signed)
Placed a bedside commode in pt room

## 2023-02-26 DIAGNOSIS — E119 Type 2 diabetes mellitus without complications: Secondary | ICD-10-CM | POA: Diagnosis not present

## 2023-02-26 DIAGNOSIS — J441 Chronic obstructive pulmonary disease with (acute) exacerbation: Secondary | ICD-10-CM | POA: Diagnosis not present

## 2023-02-26 DIAGNOSIS — J9621 Acute and chronic respiratory failure with hypoxia: Secondary | ICD-10-CM | POA: Diagnosis not present

## 2023-02-26 DIAGNOSIS — K219 Gastro-esophageal reflux disease without esophagitis: Secondary | ICD-10-CM

## 2023-02-26 DIAGNOSIS — E785 Hyperlipidemia, unspecified: Secondary | ICD-10-CM | POA: Diagnosis not present

## 2023-02-26 LAB — COMPREHENSIVE METABOLIC PANEL
ALT: 20 U/L (ref 0–44)
AST: 16 U/L (ref 15–41)
Albumin: 3.4 g/dL — ABNORMAL LOW (ref 3.5–5.0)
Alkaline Phosphatase: 100 U/L (ref 38–126)
Anion gap: 11 (ref 5–15)
BUN: 19 mg/dL (ref 8–23)
CO2: 33 mmol/L — ABNORMAL HIGH (ref 22–32)
Calcium: 8.9 mg/dL (ref 8.9–10.3)
Chloride: 96 mmol/L — ABNORMAL LOW (ref 98–111)
Creatinine, Ser: 1.11 mg/dL — ABNORMAL HIGH (ref 0.44–1.00)
GFR, Estimated: 56 mL/min — ABNORMAL LOW (ref >60)
Glucose, Bld: 164 mg/dL — ABNORMAL HIGH (ref 70–99)
Potassium: 4 mmol/L (ref 3.5–5.1)
Sodium: 140 mmol/L (ref 135–145)
Total Bilirubin: 0.5 mg/dL (ref 0.3–1.2)
Total Protein: 7.5 g/dL (ref 6.5–8.1)

## 2023-02-26 LAB — GLUCOSE, CAPILLARY
Glucose-Capillary: 146 mg/dL — ABNORMAL HIGH (ref 70–99)
Glucose-Capillary: 132 mg/dL — ABNORMAL HIGH (ref 70–99)
Glucose-Capillary: 150 mg/dL — ABNORMAL HIGH (ref 70–99)
Glucose-Capillary: 195 mg/dL — ABNORMAL HIGH (ref 70–99)

## 2023-02-26 LAB — CBC
HCT: 47.1 % — ABNORMAL HIGH (ref 36.0–46.0)
Hemoglobin: 14.3 g/dL (ref 12.0–15.0)
MCH: 29.2 pg (ref 26.0–34.0)
MCHC: 30.4 g/dL (ref 30.0–36.0)
MCV: 96.1 fL (ref 80.0–100.0)
Platelets: 249 10*3/uL (ref 150–400)
RBC: 4.9 MIL/uL (ref 3.87–5.11)
RDW: 16.1 % — ABNORMAL HIGH (ref 11.5–15.5)
WBC: 15 10*3/uL — ABNORMAL HIGH (ref 4.0–10.5)
nRBC: 0 % (ref 0.0–0.2)

## 2023-02-26 MED ORDER — ENOXAPARIN SODIUM 60 MG/0.6ML IJ SOSY
60.0000 mg | PREFILLED_SYRINGE | INTRAMUSCULAR | Status: DC
  Administered 2023-02-26 – 2023-03-02 (×5): 60 mg via SUBCUTANEOUS
  Filled 2023-02-26 (×5): qty 0.6

## 2023-02-26 NOTE — Progress Notes (Signed)
   02/26/23 2005  BiPAP/CPAP/SIPAP  BiPAP/CPAP/SIPAP Pt Type Adult  Reason BIPAP/CPAP not in use Non-compliant

## 2023-02-26 NOTE — Progress Notes (Signed)
Mobility Specialist - Progress Note   02/26/23 1356  Mobility  Activity Stood at bedside  Level of Assistance Modified independent, requires aide device or extra time  Activity Response Tolerated well  Mobility Referral Yes  $Mobility charge 1 Mobility  Mobility Specialist Start Time (ACUTE ONLY) 0152  Mobility Specialist Stop Time (ACUTE ONLY) 0155  Mobility Specialist Time Calculation (min) (ACUTE ONLY) 3 min   Pt received in bed declining mobility but agreeable to participate in STS.  Pt tolerated x5 STS. No complaints during session. Pt did have some wheezing throughout session. Pt to bed after session with all needs met.   [facility]

## 2023-02-26 NOTE — Progress Notes (Signed)
for input(s): "VITAMINB12", "FOLATE", "FERRITIN", "TIBC", "IRON", "RETICCTPCT" in the last 72 hours. Sepsis Labs: No results for input(s): "PROCALCITON", "LATICACIDVEN" in the last 168 hours.  Recent Results (from the past 240 hour(s))  SARS Coronavirus 2 by RT PCR (hospital order, performed in [facility] hospital lab) *cepheid single result test* Anterior Nasal Swab     Status: None   Collection Time: 02/24/23  9:59 PM   Specimen: Anterior Nasal Swab  Result Value Ref Range Status   SARS Coronavirus 2 by RT PCR NEGATIVE NEGATIVE Final    Comment: (NOTE) SARS-CoV-2 target nucleic acids are NOT DETECTED.  The SARS-CoV-2 RNA is generally detectable in upper and lower respiratory specimens during the acute phase of infection. The lowest concentration of SARS-CoV-2 viral copies this assay can detect is 250 copies / mL. A negative result does not preclude SARS-CoV-2 infection and should not be used as the sole basis for treatment or other patient  management decisions.  A negative result may occur with improper specimen collection / handling, submission of specimen other than nasopharyngeal swab, presence of viral mutation(s) within the areas targeted by this assay, and inadequate number of viral copies (<250 copies / mL). A negative result must be combined with clinical observations, patient history, and epidemiological information.  Fact Sheet for Patients:   RoadLapTop.co.za  Fact Sheet for Healthcare Providers: http://kim-miller.com/  This test is not yet approved or  cleared by the [place] FDA and has been authorized for detection and/or diagnosis of SARS-CoV-2 by FDA under an Emergency Use Authorization (EUA).  This EUA will remain in effect (meaning this test can be used) for the duration of the COVID-19 declaration under Section 564(b)(1) of the Act, 21 U.S.C. section 360bbb-3(b)(1), unless the authorization is terminated or revoked sooner.  Performed at [facility], [address] [place], [place] [address]      Radiology Studies: ECHOCARDIOGRAM COMPLETE  Result Date: 02/25/2023    ECHOCARDIOGRAM REPORT   Patient Name:   Brenda [name] Date of Exam: 02/25/2023 Medical Rec #:  [id number]     Height:       60.0 in Accession #:    [id number]    Weight:       260.0 lb Date of Birth:  May 11, 1961     BSA:          2.087 m Patient Age:    62 years      BP:           125/59 mmHg Patient Gender: F             HR:           70 bpm. Exam Location:  Inpatient Procedure: 2D Echo, Color Doppler, Cardiac Doppler and Intracardiac            Opacification Agent Indications:    I50.31 Acute diastolic (congestive) heart failure  History:        Patient has prior history of Echocardiogram examinations, most                 recent 05/09/2021. CHF, COPD; Risk Factors:Hypertension,                 Diabetes, Dyslipidemia and Sleep Apnea.  Sonographer:    Irving Burton Senior RDCS  Referring Phys: [id number] Alister Staver  Sonographer Comments: Very poor echo windows due to body habitus and COPD IMPRESSIONS  1. Left ventricular ejection fraction, by estimation, is 50 to 55%. The left ventricle has  for input(s): "VITAMINB12", "FOLATE", "FERRITIN", "TIBC", "IRON", "RETICCTPCT" in the last 72 hours. Sepsis Labs: No results for input(s): "PROCALCITON", "LATICACIDVEN" in the last 168 hours.  Recent Results (from the past 240 hour(s))  SARS Coronavirus 2 by RT PCR (hospital order, performed in [facility] hospital lab) *cepheid single result test* Anterior Nasal Swab     Status: None   Collection Time: 02/24/23  9:59 PM   Specimen: Anterior Nasal Swab  Result Value Ref Range Status   SARS Coronavirus 2 by RT PCR NEGATIVE NEGATIVE Final    Comment: (NOTE) SARS-CoV-2 target nucleic acids are NOT DETECTED.  The SARS-CoV-2 RNA is generally detectable in upper and lower respiratory specimens during the acute phase of infection. The lowest concentration of SARS-CoV-2 viral copies this assay can detect is 250 copies / mL. A negative result does not preclude SARS-CoV-2 infection and should not be used as the sole basis for treatment or other patient  management decisions.  A negative result may occur with improper specimen collection / handling, submission of specimen other than nasopharyngeal swab, presence of viral mutation(s) within the areas targeted by this assay, and inadequate number of viral copies (<250 copies / mL). A negative result must be combined with clinical observations, patient history, and epidemiological information.  Fact Sheet for Patients:   RoadLapTop.co.za  Fact Sheet for Healthcare Providers: http://kim-miller.com/  This test is not yet approved or  cleared by the [place] FDA and has been authorized for detection and/or diagnosis of SARS-CoV-2 by FDA under an Emergency Use Authorization (EUA).  This EUA will remain in effect (meaning this test can be used) for the duration of the COVID-19 declaration under Section 564(b)(1) of the Act, 21 U.S.C. section 360bbb-3(b)(1), unless the authorization is terminated or revoked sooner.  Performed at [facility], [address] [place], [place] [address]      Radiology Studies: ECHOCARDIOGRAM COMPLETE  Result Date: 02/25/2023    ECHOCARDIOGRAM REPORT   Patient Name:   Brenda [name] Date of Exam: 02/25/2023 Medical Rec #:  [id number]     Height:       60.0 in Accession #:    [id number]    Weight:       260.0 lb Date of Birth:  May 11, 1961     BSA:          2.087 m Patient Age:    62 years      BP:           125/59 mmHg Patient Gender: F             HR:           70 bpm. Exam Location:  Inpatient Procedure: 2D Echo, Color Doppler, Cardiac Doppler and Intracardiac            Opacification Agent Indications:    I50.31 Acute diastolic (congestive) heart failure  History:        Patient has prior history of Echocardiogram examinations, most                 recent 05/09/2021. CHF, COPD; Risk Factors:Hypertension,                 Diabetes, Dyslipidemia and Sleep Apnea.  Sonographer:    Irving Burton Senior RDCS  Referring Phys: [id number] Alister Staver  Sonographer Comments: Very poor echo windows due to body habitus and COPD IMPRESSIONS  1. Left ventricular ejection fraction, by estimation, is 50 to 55%. The left ventricle has  PROGRESS NOTE    SHONTE BEUTLER  NGE:[id number] DOB: [birth year] DOA: 02/24/2023 PCP: [name], MD   Brief Narrative:  This is a 62 year old pleasant lady with a history of CAD, systolic CHF, COPD, chronic hypoxic respiratory failure dependent on 2 L of oxygen at baseline, essential hypertension and many other comorbidities who was admitted after midnight under hospitalist service for acute COPD exacerbation and chronic hypoxic respiratory failure. Started on steroids and bronchodilators.  Assessment & Plan:   Active Problems:   COPD with acute exacerbation (HCC)   Bipolar disorder (HCC)   Essential hypertension   Non-insulin dependent type 2 diabetes mellitus (HCC)   Hyperlipidemia   History of CVA (cerebrovascular accident)   Peripheral neuropathy   GERD (gastroesophageal reflux disease)   Acute on chronic respiratory failure with hypoxia (HCC)  Acute on chronic hypoxic respiratory failure secondary to acute COPD exacerbation: Patient uses 2 L of oxygen constantly at home.  She required 4 L yesterday and currently she is on 3 L.  She still feels short of breath even with going to the bathroom but says that she is improving gradually.  She has no wheezes today as opposed to yesterday.  No crackles on exam.  She is dyspneic with minimal exertion.  Will continue Solu-Medrol and bronchodilators as well as antibiotics.  Acute on chronic congestive heart failure with preserved ejection fraction: Patient's BNP was slightly elevated and she had +3 pitting edema on examination yesterday.  Concern for acute on chronic congestive heart failure, echo shows 50-55% ejection fraction, previously it was 60 to 65%.  I will continue Lasix with strict I's and O's and low-sodium diet.  Essential hypertension: Blood pressure fluctuating but currently slightly on the low side.  Continue home regimen and monitor closely.  Non-insulin-dependent type 2 diabetes mellitus: Continue Jardiance and  SSI.  Bipolar disorder/generalized anxiety disorder: Continue Abilify.  Hyperlipidemia: Continue Lipitor.  History of cryptogenic CVA: Continue aspirin and Lipitor.  Peripheral neuropathy: Continue gabapentin.  GERD: Continue PPI.  Hypokalemia: Resolved.  CKD stage IIIa: Stable.  Morbid obesity: Weight loss and dietary modification counseled.  She is a good candidate for Ozempic.  Follow-up with PCP as outpatient.  DVT prophylaxis: SCDs Start: 02/25/23 0037 will start on Lovenox   Code Status: Full Code  Family Communication:  None present at bedside.  Plan of care discussed with patient in length and he/she verbalized understanding and agreed with it.  Status is: Inpatient Remains inpatient appropriate because: Patient still dyspneic and requiring higher oxygen than baseline.   Estimated body mass index is 52.4 kg/m as calculated from the following:   Height as of this encounter: 5' (1.524 m).   Weight as of this encounter: 121.7 kg.    Nutritional Assessment: Body mass index is 52.4 kg/m.Marland Kitchen Seen by dietician.  I agree with the assessment and plan as outlined below: Nutrition Status:        . Skin Assessment: I have examined the patient's skin and I agree with the wound assessment as performed by the wound care RN as outlined below:    Consultants:  None  Procedures:  None  Antimicrobials:  Anti-infectives (From admission, onward)    Start     Dose/Rate Route Frequency Ordered Stop   02/26/23 0600  azithromycin (ZITHROMAX) tablet 500 mg        500 mg Oral Daily 02/25/23 0036 03/02/23 0559   02/25/23 0000  cefTRIAXone (ROCEPHIN) 1 g in sodium chloride 0.9 % 100 mL IVPB  for input(s): "VITAMINB12", "FOLATE", "FERRITIN", "TIBC", "IRON", "RETICCTPCT" in the last 72 hours. Sepsis Labs: No results for input(s): "PROCALCITON", "LATICACIDVEN" in the last 168 hours.  Recent Results (from the past 240 hour(s))  SARS Coronavirus 2 by RT PCR (hospital order, performed in [facility] hospital lab) *cepheid single result test* Anterior Nasal Swab     Status: None   Collection Time: 02/24/23  9:59 PM   Specimen: Anterior Nasal Swab  Result Value Ref Range Status   SARS Coronavirus 2 by RT PCR NEGATIVE NEGATIVE Final    Comment: (NOTE) SARS-CoV-2 target nucleic acids are NOT DETECTED.  The SARS-CoV-2 RNA is generally detectable in upper and lower respiratory specimens during the acute phase of infection. The lowest concentration of SARS-CoV-2 viral copies this assay can detect is 250 copies / mL. A negative result does not preclude SARS-CoV-2 infection and should not be used as the sole basis for treatment or other patient  management decisions.  A negative result may occur with improper specimen collection / handling, submission of specimen other than nasopharyngeal swab, presence of viral mutation(s) within the areas targeted by this assay, and inadequate number of viral copies (<250 copies / mL). A negative result must be combined with clinical observations, patient history, and epidemiological information.  Fact Sheet for Patients:   RoadLapTop.co.za  Fact Sheet for Healthcare Providers: http://kim-miller.com/  This test is not yet approved or  cleared by the [place] FDA and has been authorized for detection and/or diagnosis of SARS-CoV-2 by FDA under an Emergency Use Authorization (EUA).  This EUA will remain in effect (meaning this test can be used) for the duration of the COVID-19 declaration under Section 564(b)(1) of the Act, 21 U.S.C. section 360bbb-3(b)(1), unless the authorization is terminated or revoked sooner.  Performed at [facility], [address] [place], [place] [address]      Radiology Studies: ECHOCARDIOGRAM COMPLETE  Result Date: 02/25/2023    ECHOCARDIOGRAM REPORT   Patient Name:   Brenda [name] Date of Exam: 02/25/2023 Medical Rec #:  [id number]     Height:       60.0 in Accession #:    [id number]    Weight:       260.0 lb Date of Birth:  May 11, 1961     BSA:          2.087 m Patient Age:    62 years      BP:           125/59 mmHg Patient Gender: F             HR:           70 bpm. Exam Location:  Inpatient Procedure: 2D Echo, Color Doppler, Cardiac Doppler and Intracardiac            Opacification Agent Indications:    I50.31 Acute diastolic (congestive) heart failure  History:        Patient has prior history of Echocardiogram examinations, most                 recent 05/09/2021. CHF, COPD; Risk Factors:Hypertension,                 Diabetes, Dyslipidemia and Sleep Apnea.  Sonographer:    Irving Burton Senior RDCS  Referring Phys: [id number] Alister Staver  Sonographer Comments: Very poor echo windows due to body habitus and COPD IMPRESSIONS  1. Left ventricular ejection fraction, by estimation, is 50 to 55%. The left ventricle has  PROGRESS NOTE    SHONTE BEUTLER  NGE:[id number] DOB: [birth year] DOA: 02/24/2023 PCP: [name], MD   Brief Narrative:  This is a 62 year old pleasant lady with a history of CAD, systolic CHF, COPD, chronic hypoxic respiratory failure dependent on 2 L of oxygen at baseline, essential hypertension and many other comorbidities who was admitted after midnight under hospitalist service for acute COPD exacerbation and chronic hypoxic respiratory failure. Started on steroids and bronchodilators.  Assessment & Plan:   Active Problems:   COPD with acute exacerbation (HCC)   Bipolar disorder (HCC)   Essential hypertension   Non-insulin dependent type 2 diabetes mellitus (HCC)   Hyperlipidemia   History of CVA (cerebrovascular accident)   Peripheral neuropathy   GERD (gastroesophageal reflux disease)   Acute on chronic respiratory failure with hypoxia (HCC)  Acute on chronic hypoxic respiratory failure secondary to acute COPD exacerbation: Patient uses 2 L of oxygen constantly at home.  She required 4 L yesterday and currently she is on 3 L.  She still feels short of breath even with going to the bathroom but says that she is improving gradually.  She has no wheezes today as opposed to yesterday.  No crackles on exam.  She is dyspneic with minimal exertion.  Will continue Solu-Medrol and bronchodilators as well as antibiotics.  Acute on chronic congestive heart failure with preserved ejection fraction: Patient's BNP was slightly elevated and she had +3 pitting edema on examination yesterday.  Concern for acute on chronic congestive heart failure, echo shows 50-55% ejection fraction, previously it was 60 to 65%.  I will continue Lasix with strict I's and O's and low-sodium diet.  Essential hypertension: Blood pressure fluctuating but currently slightly on the low side.  Continue home regimen and monitor closely.  Non-insulin-dependent type 2 diabetes mellitus: Continue Jardiance and  SSI.  Bipolar disorder/generalized anxiety disorder: Continue Abilify.  Hyperlipidemia: Continue Lipitor.  History of cryptogenic CVA: Continue aspirin and Lipitor.  Peripheral neuropathy: Continue gabapentin.  GERD: Continue PPI.  Hypokalemia: Resolved.  CKD stage IIIa: Stable.  Morbid obesity: Weight loss and dietary modification counseled.  She is a good candidate for Ozempic.  Follow-up with PCP as outpatient.  DVT prophylaxis: SCDs Start: 02/25/23 0037 will start on Lovenox   Code Status: Full Code  Family Communication:  None present at bedside.  Plan of care discussed with patient in length and he/she verbalized understanding and agreed with it.  Status is: Inpatient Remains inpatient appropriate because: Patient still dyspneic and requiring higher oxygen than baseline.   Estimated body mass index is 52.4 kg/m as calculated from the following:   Height as of this encounter: 5' (1.524 m).   Weight as of this encounter: 121.7 kg.    Nutritional Assessment: Body mass index is 52.4 kg/m.Marland Kitchen Seen by dietician.  I agree with the assessment and plan as outlined below: Nutrition Status:        . Skin Assessment: I have examined the patient's skin and I agree with the wound assessment as performed by the wound care RN as outlined below:    Consultants:  None  Procedures:  None  Antimicrobials:  Anti-infectives (From admission, onward)    Start     Dose/Rate Route Frequency Ordered Stop   02/26/23 0600  azithromycin (ZITHROMAX) tablet 500 mg        500 mg Oral Daily 02/25/23 0036 03/02/23 0559   02/25/23 0000  cefTRIAXone (ROCEPHIN) 1 g in sodium chloride 0.9 % 100 mL IVPB

## 2023-02-26 NOTE — TOC CM/SW Note (Signed)
Transition of Care [facility]) - Inpatient Brief Assessment   Patient Details  Name: MALIAH PYLES MRN: [id number] Date of Birth: [birth year]  Transition of Care [facility]) CM/SW Contact:    Otelia Santee, LCSW Phone Number: 02/26/2023, 3:38 PM   Clinical Narrative: Pt from home. Pt uses 2L O2 at baseline. TOC will follow for needs.   Transition of Care Asessment: Insurance and Status: Insurance coverage has been reviewed Patient has primary care physician: Yes Home environment has been reviewed: Home Prior level of function:: Independent Prior/Current Home Services: No current home services Social Determinants of Health Reivew: SDOH reviewed no interventions necessary Readmission risk has been reviewed: Yes Transition of care needs: transition of care needs identified, TOC will continue to follow

## 2023-02-27 DIAGNOSIS — J9621 Acute and chronic respiratory failure with hypoxia: Secondary | ICD-10-CM | POA: Diagnosis not present

## 2023-02-27 DIAGNOSIS — J441 Chronic obstructive pulmonary disease with (acute) exacerbation: Secondary | ICD-10-CM | POA: Diagnosis not present

## 2023-02-27 LAB — GLUCOSE, CAPILLARY
Glucose-Capillary: 193 mg/dL — ABNORMAL HIGH (ref 70–99)
Glucose-Capillary: 136 mg/dL — ABNORMAL HIGH (ref 70–99)
Glucose-Capillary: 163 mg/dL — ABNORMAL HIGH (ref 70–99)
Glucose-Capillary: 215 mg/dL — ABNORMAL HIGH (ref 70–99)

## 2023-02-27 MED ORDER — TRAMADOL HCL 50 MG PO TABS
50.0000 mg | ORAL_TABLET | Freq: Two times a day (BID) | ORAL | Status: DC | PRN
  Administered 2023-03-01: 50 mg via ORAL
  Filled 2023-02-27: qty 1

## 2023-02-27 NOTE — Progress Notes (Signed)
Sepsis Labs: No results for input(s): "PROCALCITON", "LATICACIDVEN" in the last 168 hours.  Recent Results (from the past 240 hour(s))  SARS Coronavirus 2 by RT PCR (hospital order, performed in [facility] hospital lab) *cepheid single result test* Anterior Nasal Swab     Status: None   Collection Time: 02/24/23  9:59 PM   Specimen: Anterior Nasal Swab  Result Value Ref Range Status   SARS Coronavirus 2 by RT PCR NEGATIVE NEGATIVE Final    Comment: (NOTE) SARS-CoV-2 target nucleic acids are NOT DETECTED.  The SARS-CoV-2 RNA is generally detectable in upper and lower respiratory specimens during the acute phase of infection. The lowest concentration of SARS-CoV-2 viral copies this assay can detect is 250 copies / mL. A negative result does not preclude SARS-CoV-2 infection and should not be used as the sole basis for treatment or other patient management decisions.  A negative result may occur with improper  specimen collection / handling, submission of specimen other than nasopharyngeal swab, presence of viral mutation(s) within the areas targeted by this assay, and inadequate number of viral copies (<250 copies / mL). A negative result must be combined with clinical observations, patient history, and epidemiological information.  Fact Sheet for Patients:   RoadLapTop.co.za  Fact Sheet for Healthcare Providers: http://kim-miller.com/  This test is not yet approved or  cleared by the [place] FDA and has been authorized for detection and/or diagnosis of SARS-CoV-2 by FDA under an Emergency Use Authorization (EUA).  This EUA will remain in effect (meaning this test can be used) for the duration of the COVID-19 declaration under Section 564(b)(1) of the Act, 21 U.S.C. section 360bbb-3(b)(1), unless the authorization is terminated or revoked sooner.  Performed at [facility], [address] [place], [place] [address]      Radiology Studies: ECHOCARDIOGRAM COMPLETE  Result Date: 02/25/2023    ECHOCARDIOGRAM REPORT   Patient Name:   Brenda [name] Date of Exam: 02/25/2023 Medical Rec #:  [id number]     Height:       60.0 in Accession #:    [id number]    Weight:       260.0 lb Date of Birth:  02/28/1961     BSA:          2.087 m Patient Age:    62 years      BP:           125/59 mmHg Patient Gender: F             HR:           70 bpm. Exam Location:  Inpatient Procedure: 2D Echo, Color Doppler, Cardiac Doppler and Intracardiac            Opacification Agent Indications:    I50.31 Acute diastolic (congestive) heart failure  History:        Patient has prior history of Echocardiogram examinations, most                 recent 05/09/2021. CHF, COPD; Risk Factors:Hypertension,                 Diabetes, Dyslipidemia and Sleep Apnea.  Sonographer:    Irving Burton Senior RDCS Referring Phys: [id number] Leasa Kincannon  Sonographer Comments: Very poor  echo windows due to body habitus and COPD IMPRESSIONS  1. Left ventricular ejection fraction, by estimation, is 50 to 55%. The left ventricle has low normal function. The left ventricle demonstrates global hypokinesis. Left ventricular diastolic parameters  PROGRESS NOTE    Brenda [name]  ZOX:[id number] DOB: 1960/06/02 DOA: 02/24/2023 PCP: [name], MD   Brief Narrative:  This is a 62 year old pleasant lady with a history of CAD, systolic CHF, COPD, chronic hypoxic respiratory failure dependent on 2 L of oxygen at baseline, essential hypertension and many other comorbidities who was admitted after midnight under hospitalist service for acute COPD exacerbation and chronic hypoxic respiratory failure. Started on steroids and bronchodilators.  Assessment & Plan:   Active Problems:   COPD with acute exacerbation (HCC)   Bipolar disorder (HCC)   Essential hypertension   Non-insulin dependent type 2 diabetes mellitus (HCC)   Hyperlipidemia   History of CVA (cerebrovascular accident)   Peripheral neuropathy   GERD (gastroesophageal reflux disease)   Acute on chronic respiratory failure with hypoxia (HCC)  Acute on chronic hypoxic respiratory failure secondary to acute COPD exacerbation: Patient uses 2 L of oxygen constantly at home.  She required 4 L initially but has been weaned down to 2 L now.  Still with end expiratory wheezes and dyspnea on exertion.  Does not look ready or comfortable going home.  Continue Solu-Medrol with bronchodilators and rest of the management.    Acute on chronic congestive heart failure with preserved ejection fraction: Patient's BNP was slightly elevated and she had +3 pitting edema on examination yesterday.  Concern for acute on chronic congestive heart failure, echo shows 50-55% ejection fraction, previously it was 60 to 65%.  Good diuresis yesterday.  Net -2.6 L.  Continue IV Lasix.  Essential hypertension: Blood pressure fluctuating but now fairly controlled.  Continue home regimen and monitor closely.  Non-insulin-dependent type 2 diabetes mellitus: Continue Jardiance and SSI.  Bipolar disorder/generalized anxiety disorder: Continue Abilify.  Hyperlipidemia: Continue Lipitor.  History of  cryptogenic CVA: Continue aspirin and Lipitor.  Peripheral neuropathy: Continue gabapentin.  GERD: Continue PPI.  Hypokalemia: Resolved.  CKD stage IIIa: Stable.  Morbid obesity: Weight loss and dietary modification counseled.  She is a good candidate for Ozempic.  Follow-up with PCP as outpatient.  DVT prophylaxis: SCDs Start: 02/25/23 0037 will start on Lovenox   Code Status: Full Code  Family Communication:  None present at bedside.  Plan of care discussed with patient in length and he/she verbalized understanding and agreed with it.  Status is: Inpatient Remains inpatient appropriate because: Patient still dyspneic and symptomatic.   Estimated body mass index is 53.56 kg/m as calculated from the following:   Height as of this encounter: 5' (1.524 m).   Weight as of this encounter: 124.4 kg.    Nutritional Assessment: Body mass index is 53.56 kg/m.Marland Kitchen Seen by dietician.  I agree with the assessment and plan as outlined below: Nutrition Status:        . Skin Assessment: I have examined the patient's skin and I agree with the wound assessment as performed by the wound care RN as outlined below:    Consultants:  None  Procedures:  None  Antimicrobials:  Anti-infectives (From admission, onward)    Start     Dose/Rate Route Frequency Ordered Stop   02/26/23 0600  azithromycin (ZITHROMAX) tablet 500 mg        500 mg Oral Daily 02/25/23 0036 03/02/23 0559   02/25/23 0000  cefTRIAXone (ROCEPHIN) 1 g in sodium chloride 0.9 % 100 mL IVPB        1 g 200 mL/hr over 30 Minutes Intravenous  Once 02/24/23 2345 02/25/23 0049   02/25/23 0000  azithromycin (ZITHROMAX)  Sepsis Labs: No results for input(s): "PROCALCITON", "LATICACIDVEN" in the last 168 hours.  Recent Results (from the past 240 hour(s))  SARS Coronavirus 2 by RT PCR (hospital order, performed in [facility] hospital lab) *cepheid single result test* Anterior Nasal Swab     Status: None   Collection Time: 02/24/23  9:59 PM   Specimen: Anterior Nasal Swab  Result Value Ref Range Status   SARS Coronavirus 2 by RT PCR NEGATIVE NEGATIVE Final    Comment: (NOTE) SARS-CoV-2 target nucleic acids are NOT DETECTED.  The SARS-CoV-2 RNA is generally detectable in upper and lower respiratory specimens during the acute phase of infection. The lowest concentration of SARS-CoV-2 viral copies this assay can detect is 250 copies / mL. A negative result does not preclude SARS-CoV-2 infection and should not be used as the sole basis for treatment or other patient management decisions.  A negative result may occur with improper  specimen collection / handling, submission of specimen other than nasopharyngeal swab, presence of viral mutation(s) within the areas targeted by this assay, and inadequate number of viral copies (<250 copies / mL). A negative result must be combined with clinical observations, patient history, and epidemiological information.  Fact Sheet for Patients:   RoadLapTop.co.za  Fact Sheet for Healthcare Providers: http://kim-miller.com/  This test is not yet approved or  cleared by the [place] FDA and has been authorized for detection and/or diagnosis of SARS-CoV-2 by FDA under an Emergency Use Authorization (EUA).  This EUA will remain in effect (meaning this test can be used) for the duration of the COVID-19 declaration under Section 564(b)(1) of the Act, 21 U.S.C. section 360bbb-3(b)(1), unless the authorization is terminated or revoked sooner.  Performed at [facility], [address] [place], [place] [address]      Radiology Studies: ECHOCARDIOGRAM COMPLETE  Result Date: 02/25/2023    ECHOCARDIOGRAM REPORT   Patient Name:   Brenda [name] Date of Exam: 02/25/2023 Medical Rec #:  [id number]     Height:       60.0 in Accession #:    [id number]    Weight:       260.0 lb Date of Birth:  02/28/1961     BSA:          2.087 m Patient Age:    62 years      BP:           125/59 mmHg Patient Gender: F             HR:           70 bpm. Exam Location:  Inpatient Procedure: 2D Echo, Color Doppler, Cardiac Doppler and Intracardiac            Opacification Agent Indications:    I50.31 Acute diastolic (congestive) heart failure  History:        Patient has prior history of Echocardiogram examinations, most                 recent 05/09/2021. CHF, COPD; Risk Factors:Hypertension,                 Diabetes, Dyslipidemia and Sleep Apnea.  Sonographer:    Irving Burton Senior RDCS Referring Phys: [id number] Leasa Kincannon  Sonographer Comments: Very poor  echo windows due to body habitus and COPD IMPRESSIONS  1. Left ventricular ejection fraction, by estimation, is 50 to 55%. The left ventricle has low normal function. The left ventricle demonstrates global hypokinesis. Left ventricular diastolic parameters  PROGRESS NOTE    Brenda [name]  ZOX:[id number] DOB: 1960/06/02 DOA: 02/24/2023 PCP: [name], MD   Brief Narrative:  This is a 62 year old pleasant lady with a history of CAD, systolic CHF, COPD, chronic hypoxic respiratory failure dependent on 2 L of oxygen at baseline, essential hypertension and many other comorbidities who was admitted after midnight under hospitalist service for acute COPD exacerbation and chronic hypoxic respiratory failure. Started on steroids and bronchodilators.  Assessment & Plan:   Active Problems:   COPD with acute exacerbation (HCC)   Bipolar disorder (HCC)   Essential hypertension   Non-insulin dependent type 2 diabetes mellitus (HCC)   Hyperlipidemia   History of CVA (cerebrovascular accident)   Peripheral neuropathy   GERD (gastroesophageal reflux disease)   Acute on chronic respiratory failure with hypoxia (HCC)  Acute on chronic hypoxic respiratory failure secondary to acute COPD exacerbation: Patient uses 2 L of oxygen constantly at home.  She required 4 L initially but has been weaned down to 2 L now.  Still with end expiratory wheezes and dyspnea on exertion.  Does not look ready or comfortable going home.  Continue Solu-Medrol with bronchodilators and rest of the management.    Acute on chronic congestive heart failure with preserved ejection fraction: Patient's BNP was slightly elevated and she had +3 pitting edema on examination yesterday.  Concern for acute on chronic congestive heart failure, echo shows 50-55% ejection fraction, previously it was 60 to 65%.  Good diuresis yesterday.  Net -2.6 L.  Continue IV Lasix.  Essential hypertension: Blood pressure fluctuating but now fairly controlled.  Continue home regimen and monitor closely.  Non-insulin-dependent type 2 diabetes mellitus: Continue Jardiance and SSI.  Bipolar disorder/generalized anxiety disorder: Continue Abilify.  Hyperlipidemia: Continue Lipitor.  History of  cryptogenic CVA: Continue aspirin and Lipitor.  Peripheral neuropathy: Continue gabapentin.  GERD: Continue PPI.  Hypokalemia: Resolved.  CKD stage IIIa: Stable.  Morbid obesity: Weight loss and dietary modification counseled.  She is a good candidate for Ozempic.  Follow-up with PCP as outpatient.  DVT prophylaxis: SCDs Start: 02/25/23 0037 will start on Lovenox   Code Status: Full Code  Family Communication:  None present at bedside.  Plan of care discussed with patient in length and he/she verbalized understanding and agreed with it.  Status is: Inpatient Remains inpatient appropriate because: Patient still dyspneic and symptomatic.   Estimated body mass index is 53.56 kg/m as calculated from the following:   Height as of this encounter: 5' (1.524 m).   Weight as of this encounter: 124.4 kg.    Nutritional Assessment: Body mass index is 53.56 kg/m.Marland Kitchen Seen by dietician.  I agree with the assessment and plan as outlined below: Nutrition Status:        . Skin Assessment: I have examined the patient's skin and I agree with the wound assessment as performed by the wound care RN as outlined below:    Consultants:  None  Procedures:  None  Antimicrobials:  Anti-infectives (From admission, onward)    Start     Dose/Rate Route Frequency Ordered Stop   02/26/23 0600  azithromycin (ZITHROMAX) tablet 500 mg        500 mg Oral Daily 02/25/23 0036 03/02/23 0559   02/25/23 0000  cefTRIAXone (ROCEPHIN) 1 g in sodium chloride 0.9 % 100 mL IVPB        1 g 200 mL/hr over 30 Minutes Intravenous  Once 02/24/23 2345 02/25/23 0049   02/25/23 0000  azithromycin (ZITHROMAX)  Sepsis Labs: No results for input(s): "PROCALCITON", "LATICACIDVEN" in the last 168 hours.  Recent Results (from the past 240 hour(s))  SARS Coronavirus 2 by RT PCR (hospital order, performed in [facility] hospital lab) *cepheid single result test* Anterior Nasal Swab     Status: None   Collection Time: 02/24/23  9:59 PM   Specimen: Anterior Nasal Swab  Result Value Ref Range Status   SARS Coronavirus 2 by RT PCR NEGATIVE NEGATIVE Final    Comment: (NOTE) SARS-CoV-2 target nucleic acids are NOT DETECTED.  The SARS-CoV-2 RNA is generally detectable in upper and lower respiratory specimens during the acute phase of infection. The lowest concentration of SARS-CoV-2 viral copies this assay can detect is 250 copies / mL. A negative result does not preclude SARS-CoV-2 infection and should not be used as the sole basis for treatment or other patient management decisions.  A negative result may occur with improper  specimen collection / handling, submission of specimen other than nasopharyngeal swab, presence of viral mutation(s) within the areas targeted by this assay, and inadequate number of viral copies (<250 copies / mL). A negative result must be combined with clinical observations, patient history, and epidemiological information.  Fact Sheet for Patients:   RoadLapTop.co.za  Fact Sheet for Healthcare Providers: http://kim-miller.com/  This test is not yet approved or  cleared by the [place] FDA and has been authorized for detection and/or diagnosis of SARS-CoV-2 by FDA under an Emergency Use Authorization (EUA).  This EUA will remain in effect (meaning this test can be used) for the duration of the COVID-19 declaration under Section 564(b)(1) of the Act, 21 U.S.C. section 360bbb-3(b)(1), unless the authorization is terminated or revoked sooner.  Performed at [facility], [address] [place], [place] [address]      Radiology Studies: ECHOCARDIOGRAM COMPLETE  Result Date: 02/25/2023    ECHOCARDIOGRAM REPORT   Patient Name:   Brenda [name] Date of Exam: 02/25/2023 Medical Rec #:  [id number]     Height:       60.0 in Accession #:    [id number]    Weight:       260.0 lb Date of Birth:  02/28/1961     BSA:          2.087 m Patient Age:    62 years      BP:           125/59 mmHg Patient Gender: F             HR:           70 bpm. Exam Location:  Inpatient Procedure: 2D Echo, Color Doppler, Cardiac Doppler and Intracardiac            Opacification Agent Indications:    I50.31 Acute diastolic (congestive) heart failure  History:        Patient has prior history of Echocardiogram examinations, most                 recent 05/09/2021. CHF, COPD; Risk Factors:Hypertension,                 Diabetes, Dyslipidemia and Sleep Apnea.  Sonographer:    Irving Burton Senior RDCS Referring Phys: [id number] Leasa Kincannon  Sonographer Comments: Very poor  echo windows due to body habitus and COPD IMPRESSIONS  1. Left ventricular ejection fraction, by estimation, is 50 to 55%. The left ventricle has low normal function. The left ventricle demonstrates global hypokinesis. Left ventricular diastolic parameters

## 2023-02-27 NOTE — Progress Notes (Signed)
   02/27/23 2017  BiPAP/CPAP/SIPAP  Reason BIPAP/CPAP not in use Non-compliant (Pt refused)

## 2023-02-27 NOTE — Plan of Care (Signed)
  Problem: Respiratory: Goal: Levels of oxygenation will improve Outcome: Progressing   Problem: Education: Goal: Individualized Educational Video(s) Outcome: Progressing   Problem: Coping: Goal: Ability to adjust to condition or change in health will improve Outcome: Progressing   Problem: Fluid Volume: Goal: Ability to maintain a balanced intake and output will improve Outcome: Progressing   Problem: Metabolic: Goal: Ability to maintain appropriate glucose levels will improve Outcome: Progressing   Problem: Nutritional: Goal: Maintenance of adequate nutrition will improve Outcome: Progressing   Problem: Tissue Perfusion: Goal: Adequacy of tissue perfusion will improve Outcome: Progressing   Problem: Education: Goal: Knowledge of General Education information will improve Description: Including pain rating scale, medication(s)/side effects and non-pharmacologic comfort measures Outcome: Progressing

## 2023-02-28 DIAGNOSIS — J441 Chronic obstructive pulmonary disease with (acute) exacerbation: Secondary | ICD-10-CM | POA: Diagnosis not present

## 2023-02-28 DIAGNOSIS — J9621 Acute and chronic respiratory failure with hypoxia: Secondary | ICD-10-CM | POA: Diagnosis not present

## 2023-02-28 DIAGNOSIS — E119 Type 2 diabetes mellitus without complications: Secondary | ICD-10-CM | POA: Diagnosis not present

## 2023-02-28 DIAGNOSIS — K219 Gastro-esophageal reflux disease without esophagitis: Secondary | ICD-10-CM | POA: Diagnosis not present

## 2023-02-28 LAB — BASIC METABOLIC PANEL
Anion gap: 13 (ref 5–15)
BUN: 36 mg/dL — ABNORMAL HIGH (ref 8–23)
CO2: 29 mmol/L (ref 22–32)
Calcium: 8.9 mg/dL (ref 8.9–10.3)
Chloride: 95 mmol/L — ABNORMAL LOW (ref 98–111)
Creatinine, Ser: 1.05 mg/dL — ABNORMAL HIGH (ref 0.44–1.00)
GFR, Estimated: 59 mL/min — ABNORMAL LOW (ref >60)
Glucose, Bld: 183 mg/dL — ABNORMAL HIGH (ref 70–99)
Potassium: 4.1 mmol/L (ref 3.5–5.1)
Sodium: 137 mmol/L (ref 135–145)

## 2023-02-28 LAB — CBC WITH DIFFERENTIAL/PLATELET
Abs Immature Granulocytes: 0.16 10*3/uL — ABNORMAL HIGH (ref 0.00–0.07)
Basophils Absolute: 0.1 10*3/uL (ref 0.0–0.1)
Basophils Relative: 0 %
Eosinophils Absolute: 0 10*3/uL (ref 0.0–0.5)
Eosinophils Relative: 0 %
HCT: 49.8 % — ABNORMAL HIGH (ref 36.0–46.0)
Hemoglobin: 15.4 g/dL — ABNORMAL HIGH (ref 12.0–15.0)
Immature Granulocytes: 1 %
Lymphocytes Relative: 17 %
Lymphs Abs: 3.2 10*3/uL (ref 0.7–4.0)
MCH: 28.6 pg (ref 26.0–34.0)
MCHC: 30.9 g/dL (ref 30.0–36.0)
MCV: 92.4 fL (ref 80.0–100.0)
Monocytes Absolute: 1 10*3/uL (ref 0.1–1.0)
Monocytes Relative: 5 %
Neutro Abs: 14.9 10*3/uL — ABNORMAL HIGH (ref 1.7–7.7)
Neutrophils Relative %: 77 %
Platelets: 277 10*3/uL (ref 150–400)
RBC: 5.39 MIL/uL — ABNORMAL HIGH (ref 3.87–5.11)
RDW: 16 % — ABNORMAL HIGH (ref 11.5–15.5)
WBC: 19.3 10*3/uL — ABNORMAL HIGH (ref 4.0–10.5)
nRBC: 0 % (ref 0.0–0.2)

## 2023-02-28 LAB — GLUCOSE, CAPILLARY
Glucose-Capillary: 187 mg/dL — ABNORMAL HIGH (ref 70–99)
Glucose-Capillary: 171 mg/dL — ABNORMAL HIGH (ref 70–99)
Glucose-Capillary: 160 mg/dL — ABNORMAL HIGH (ref 70–99)
Glucose-Capillary: 205 mg/dL — ABNORMAL HIGH (ref 70–99)

## 2023-02-28 MED ORDER — INSULIN GLARGINE-YFGN 100 UNIT/ML ~~LOC~~ SOLN
10.0000 [IU] | Freq: Every day | SUBCUTANEOUS | Status: DC
  Administered 2023-02-28 – 2023-03-02 (×3): 10 [IU] via SUBCUTANEOUS
  Filled 2023-02-28 (×3): qty 0.1

## 2023-02-28 NOTE — Progress Notes (Signed)
Mobility Specialist - Progress Note   02/28/23 1100  Mobility  Activity Ambulated with assistance in hallway  Level of Assistance Standby assist, set-up cues, supervision of patient - no hands on  Assistive Device None  Distance Ambulated (ft) 190 ft  Range of Motion/Exercises Active  Activity Response Tolerated well  Mobility Referral Yes  $Mobility charge 1 Mobility  Mobility Specialist Start Time (ACUTE ONLY) 1040  Mobility Specialist Stop Time (ACUTE ONLY) 1055  Mobility Specialist Time Calculation (min) (ACUTE ONLY) 15 min   Pt received in bed and agreed to mobility, some labored breathing throughout session, SpO2% was at 87% on 3L.   After one standing rest break pt saturation increased to 91%, continued back to room where pt desat back to 87% but returned quickly when sitting EOB.  Pt left in bed with all needs met, O2 saturation back up to 93% under 1 minute when in bed.  Marilynne Halsted Mobility Specialist

## 2023-02-28 NOTE — Progress Notes (Signed)
   02/28/23 2310  BiPAP/CPAP/SIPAP  Reason BIPAP/CPAP not in use Non-compliant

## 2023-02-28 NOTE — Progress Notes (Signed)
PROGRESS NOTE    Brenda [name]  AOZ:[id number] DOB: [birth year] DOA: 02/24/2023 PCP: [name], MD   Brief Narrative:  This is a 62 year old pleasant lady with a history of CAD, systolic CHF, COPD, chronic hypoxic respiratory failure dependent on 2 L of oxygen at baseline, essential hypertension and many other comorbidities who was admitted after midnight under hospitalist service for acute COPD exacerbation and chronic hypoxic respiratory failure. Started on steroids and bronchodilators.  Assessment & Plan:   Active Problems:   COPD with acute exacerbation (HCC)   Bipolar disorder (HCC)   Essential hypertension   Non-insulin dependent type 2 diabetes mellitus (HCC)   Hyperlipidemia   History of CVA (cerebrovascular accident)   Peripheral neuropathy   GERD (gastroesophageal reflux disease)   Acute on chronic respiratory failure with hypoxia (HCC)  Acute on chronic hypoxic respiratory failure secondary to acute COPD exacerbation: Patient uses 2 L of oxygen constantly at home.  She required 4 L initially but has been weaned down to 2 L now.  Although she is requiring her baseline oxygen but she continues to remain dyspneic with minimal exertion and continues to have bilateral expiratory wheezes.  Will continue current therapy of steroids antibiotics and bronchodilators.    Acute on chronic congestive heart failure with preserved ejection fraction: Patient's BNP was slightly elevated and she had +3 pitting edema on examination yesterday.  Concern for acute on chronic congestive heart failure, echo shows 50-55% ejection fraction, previously it was 60 to 65%.  Good diuresis yesterday again with net -6.5 L and edema has improved..  Continue IV Lasix.  Essential hypertension: Blood pressure fluctuating but now fairly controlled.  Continue home regimen and monitor closely.  Non-insulin-dependent type 2 diabetes mellitus: Slightly hyperglycemic.  Add Semglee 10 units.  Continue Jardiance  and SSI.  Bipolar disorder/generalized anxiety disorder: Continue Abilify.  Hyperlipidemia: Continue Lipitor.  History of cryptogenic CVA: Continue aspirin and Lipitor.  Peripheral neuropathy: Continue gabapentin.  GERD: Continue PPI.  Hypokalemia: Resolved.  CKD stage IIIa: Stable.  Morbid obesity: Weight loss and dietary modification counseled.  She is a good candidate for Ozempic.  Follow-up with PCP as outpatient.  DVT prophylaxis: SCDs Start: 02/25/23 0037 will start on Lovenox   Code Status: Full Code  Family Communication:  None present at bedside.  Plan of care discussed with patient in length and he/she verbalized understanding and agreed with it.  Status is: Inpatient Remains inpatient appropriate because: Patient still dyspneic and symptomatic.   Estimated body mass index is 53.22 kg/m as calculated from the following:   Height as of this encounter: 5' (1.524 m).   Weight as of this encounter: 123.6 kg.    Nutritional Assessment: Body mass index is 53.22 kg/m.Marland Kitchen Seen by dietician.  I agree with the assessment and plan as outlined below: Nutrition Status:        . Skin Assessment: I have examined the patient's skin and I agree with the wound assessment as performed by the wound care RN as outlined below:    Consultants:  None  Procedures:  None  Antimicrobials:  Anti-infectives (From admission, onward)    Start     Dose/Rate Route Frequency Ordered Stop   02/26/23 0600  azithromycin (ZITHROMAX) tablet 500 mg        500 mg Oral Daily 02/25/23 0036 03/02/23 0559   02/25/23 0000  cefTRIAXone (ROCEPHIN) 1 g in sodium chloride 0.9 % 100 mL IVPB        1 g 200  mL/hr over 30 Minutes Intravenous  Once 02/24/23 2345 02/25/23 0049   02/25/23 0000  azithromycin (ZITHROMAX) 500 mg in sodium chloride 0.9 % 250 mL IVPB        500 mg 250 mL/hr over 60 Minutes Intravenous  Once 02/24/23 2345 02/25/23 0234         Subjective: Seen and examined.  Still  with shortness of breath with minimal exertion.  Not much improvement compared to yesterday.  Objective: Vitals:   02/27/23 2016 02/28/23 0111 02/28/23 0644 02/28/23 0749  BP:   118/67   Pulse:   64   Resp:   18   Temp:   97.6 F (36.4 C)   TempSrc:      SpO2: 94%  95% 96%  Weight:  123.6 kg    Height:        Intake/Output Summary (Last 24 hours) at 02/28/2023 1211 Last data filed at 02/28/2023 0900 Gross per 24 hour  Intake 1110 ml  Output 3800 ml  Net -2690 ml   Filed Weights   02/26/23 0513 02/27/23 0500 02/28/23 0111  Weight: 121.7 kg 124.4 kg 123.6 kg    Examination:  General exam: Appears calm and comfortable, morbidly obese Respiratory system: Diffuse bilateral expiratory wheezes. Respiratory effort normal. Cardiovascular system: S1 & S2 heard, RRR. No JVD, murmurs, rubs, gallops or clicks. No pedal edema. Gastrointestinal system: Abdomen is nondistended, soft and nontender. No organomegaly or masses felt. Normal bowel sounds heard. Central nervous system: Alert and oriented. No focal neurological deficits. Extremities: Symmetric 5 x 5 power. Skin: No rashes, lesions or ulcers.  Psychiatry: Judgement and insight appear normal. Mood & affect appropriate.   Data Reviewed: I have personally reviewed following labs and imaging studies  CBC: Recent Labs  Lab 02/24/23 2220 02/25/23 0255 02/26/23 0523 02/28/23 0913  WBC 13.4* 13.8* 15.0* 19.3*  NEUTROABS 9.7*  --   --  14.9*  HGB 13.4 13.3 14.3 15.4*  HCT 43.7 43.3 47.1* 49.8*  MCV 95.6 94.1 96.1 92.4  PLT 205 228 249 277   Basic Metabolic Panel: Recent Labs  Lab 02/24/23 2220 02/25/23 0255 02/26/23 0523 02/28/23 0913  NA 139 138 140 137  K 3.7 3.3* 4.0 4.1  CL 103 98 96* 95*  CO2 28 29 33* 29  GLUCOSE 105* 214* 164* 183*  BUN 11 14 19  36*  CREATININE 0.91 1.09* 1.11* 1.05*  CALCIUM 8.6* 8.5* 8.9 8.9   GFR: Estimated Creatinine Clearance: 65.5 mL/min (A) (by C-G formula based on SCr of 1.05 mg/dL  (H)). Liver Function Tests: Recent Labs  Lab 02/25/23 0255 02/26/23 0523  AST 17 16  ALT 19 20  ALKPHOS 99 100  BILITOT 0.5 0.5  PROT 7.4 7.5  ALBUMIN 3.4* 3.4*   No results for input(s): "LIPASE", "AMYLASE" in the last 168 hours. No results for input(s): "AMMONIA" in the last 168 hours. Coagulation Profile: No results for input(s): "INR", "PROTIME" in the last 168 hours. Cardiac Enzymes: No results for input(s): "CKTOTAL", "CKMB", "CKMBINDEX", "TROPONINI" in the last 168 hours. BNP (last 3 results) Recent Labs    10/14/22 1619 01/14/23 1036  PROBNP 19.0 34.0   HbA1C: No results for input(s): "HGBA1C" in the last 72 hours. CBG: Recent Labs  Lab 02/27/23 1152 02/27/23 1629 02/27/23 2001 02/28/23 0817 02/28/23 1135  GLUCAP 136* 163* 215* 187* 171*   Lipid Profile: No results for input(s): "CHOL", "HDL", "LDLCALC", "TRIG", "CHOLHDL", "LDLDIRECT" in the last 72 hours. Thyroid Function Tests: No results for  input(s): "TSH", "T4TOTAL", "FREET4", "T3FREE", "THYROIDAB" in the last 72 hours. Anemia Panel: No results for input(s): "VITAMINB12", "FOLATE", "FERRITIN", "TIBC", "IRON", "RETICCTPCT" in the last 72 hours. Sepsis Labs: No results for input(s): "PROCALCITON", "LATICACIDVEN" in the last 168 hours.  Recent Results (from the past 240 hour(s))  SARS Coronavirus 2 by RT PCR (hospital order, performed in [facility] hospital lab) *cepheid single result test* Anterior Nasal Swab     Status: None   Collection Time: 02/24/23  9:59 PM   Specimen: Anterior Nasal Swab  Result Value Ref Range Status   SARS Coronavirus 2 by RT PCR NEGATIVE NEGATIVE Final    Comment: (NOTE) SARS-CoV-2 target nucleic acids are NOT DETECTED.  The SARS-CoV-2 RNA is generally detectable in upper and lower respiratory specimens during the acute phase of infection. The lowest concentration of SARS-CoV-2 viral copies this assay can detect is 250 copies / mL. A negative result does not preclude  SARS-CoV-2 infection and should not be used as the sole basis for treatment or other patient management decisions.  A negative result may occur with improper specimen collection / handling, submission of specimen other than nasopharyngeal swab, presence of viral mutation(s) within the areas targeted by this assay, and inadequate number of viral copies (<250 copies / mL). A negative result must be combined with clinical observations, patient history, and epidemiological information.  Fact Sheet for Patients:   RoadLapTop.co.za  Fact Sheet for Healthcare Providers: http://kim-miller.com/  This test is not yet approved or  cleared by the [place] FDA and has been authorized for detection and/or diagnosis of SARS-CoV-2 by FDA under an Emergency Use Authorization (EUA).  This EUA will remain in effect (meaning this test can be used) for the duration of the COVID-19 declaration under Section 564(b)(1) of the Act, 21 U.S.C. section 360bbb-3(b)(1), unless the authorization is terminated or revoked sooner.  Performed at [facility], [address] [place], [place] [address]      Radiology Studies: No results found.  Scheduled Meds:  ARIPiprazole  10 mg Oral Daily   aspirin EC  81 mg Oral Daily   atorvastatin  80 mg Oral Daily   azithromycin  500 mg Oral Daily   empagliflozin  10 mg Oral Daily   enoxaparin (LOVENOX) injection  60 mg Subcutaneous Q24H   escitalopram  20 mg Oral Daily   furosemide  40 mg Intravenous BID   gabapentin  600 mg Oral q AM   And   gabapentin  900 mg Oral QHS   insulin aspart  0-5 Units Subcutaneous QHS   insulin aspart  0-6 Units Subcutaneous TID WC   ipratropium-albuterol  3 mL Nebulization TID   loratadine  10 mg Oral Daily   methylPREDNISolone (SOLU-MEDROL) injection  40 mg Intravenous Q12H   metoprolol succinate  25 mg Oral Daily   pantoprazole  40 mg Oral BID    sacubitril-valsartan  1 tablet Oral BID   sodium chloride flush  3 mL Intravenous Q12H   spironolactone  25 mg Oral Daily   Continuous Infusions:  sodium chloride       LOS: 3 days   Hughie Closs, MD [facility]  02/28/2023, 12:11 PM   *Please note that this is a verbal dictation therefore any spelling or grammatical errors are due to the "Dragon Medical One" system interpretation.  Please page via Amion and do not message via secure chat for urgent patient care matters. Secure chat can be used for non urgent patient  care matters.  How to contact the [facility] Attending or Consulting provider 7A - 7P or covering provider during after hours 7P -7A, for this patient?  Check the care team in [facility] and look for a) attending/consulting TRH provider listed and b) the [facility] team listed. Page or secure chat 7A-7P. Log into [email or web address] and use [place]'s universal password to access. If you do not have the password, please contact the hospital operator. Locate the [facility] provider you are looking for under [facility] and page to a number that you can be directly reached. If you still have difficulty reaching the provider, please page the [facility] (Director on Call) for the Hospitalists listed on amion for assistance.

## 2023-03-01 DIAGNOSIS — Z8673 Personal history of transient ischemic attack (TIA), and cerebral infarction without residual deficits: Secondary | ICD-10-CM | POA: Diagnosis not present

## 2023-03-01 DIAGNOSIS — K219 Gastro-esophageal reflux disease without esophagitis: Secondary | ICD-10-CM | POA: Diagnosis not present

## 2023-03-01 DIAGNOSIS — J441 Chronic obstructive pulmonary disease with (acute) exacerbation: Secondary | ICD-10-CM | POA: Diagnosis not present

## 2023-03-01 DIAGNOSIS — J9621 Acute and chronic respiratory failure with hypoxia: Secondary | ICD-10-CM | POA: Diagnosis not present

## 2023-03-01 LAB — GLUCOSE, CAPILLARY
Glucose-Capillary: 161 mg/dL — ABNORMAL HIGH (ref 70–99)
Glucose-Capillary: 114 mg/dL — ABNORMAL HIGH (ref 70–99)
Glucose-Capillary: 145 mg/dL — ABNORMAL HIGH (ref 70–99)
Glucose-Capillary: 184 mg/dL — ABNORMAL HIGH (ref 70–99)

## 2023-03-01 NOTE — Plan of Care (Signed)
  Problem: Respiratory: Goal: Ability to maintain a clear airway will improve Outcome: Progressing Goal: Levels of oxygenation will improve Outcome: Progressing Goal: Ability to maintain adequate ventilation will improve Outcome: Progressing   Problem: Clinical Measurements: Goal: Ability to maintain clinical measurements within normal limits will improve Outcome: Progressing Goal: Will remain free from infection Outcome: Progressing Goal: Diagnostic test results will improve Outcome: Progressing Goal: Respiratory complications will improve Outcome: Progressing Goal: Cardiovascular complication will be avoided Outcome: Progressing   Problem: Activity: Goal: Risk for activity intolerance will decrease Outcome: Progressing

## 2023-03-01 NOTE — Progress Notes (Signed)
Mobility Specialist - Progress Note   03/01/23 1117  Mobility  Activity Ambulated with assistance in hallway  Level of Assistance Standby assist, set-up cues, supervision of patient - no hands on  Assistive Device None  Distance Ambulated (ft) 250 ft  Range of Motion/Exercises Active  Activity Response Tolerated well  Mobility Referral Yes  $Mobility charge 1 Mobility  Mobility Specialist Start Time (ACUTE ONLY) 1030  Mobility Specialist Stop Time (ACUTE ONLY) 1040  Mobility Specialist Time Calculation (min) (ACUTE ONLY) 10 min   Pt received in bed and agreed to mobility. Pt on 3L, needed one sitting rest break, O2 saturations was at 89%, recovered to 94% under 1 min.  Returned to chair with all needs met, O2 saturation at 92%.  Marilynne Halsted Mobility Specialist

## 2023-03-01 NOTE — Progress Notes (Signed)
PROGRESS NOTE    Brenda [name]  WJX:[id number] DOB: 10-27-1960 DOA: 02/24/2023 PCP: [name], MD   Brief Narrative:  This is a 62 year old pleasant lady with a history of CAD, systolic CHF, COPD, chronic hypoxic respiratory failure dependent on 2 L of oxygen at baseline, essential hypertension and many other comorbidities who was admitted after midnight under hospitalist service for acute COPD exacerbation and chronic hypoxic respiratory failure. Started on steroids and bronchodilators.  Assessment & Plan:   Active Problems:   COPD with acute exacerbation (HCC)   Bipolar disorder (HCC)   Essential hypertension   Non-insulin dependent type 2 diabetes mellitus (HCC)   Hyperlipidemia   History of CVA (cerebrovascular accident)   Peripheral neuropathy   GERD (gastroesophageal reflux disease)   Acute on chronic respiratory failure with hypoxia (HCC)  Acute on chronic hypoxic respiratory failure secondary to acute COPD exacerbation: Patient uses 2 L of oxygen constantly at home.  She required 4 L initially but has been weaned down to 2 L now.  She says that she walked in the hallway yesterday and dropped her oxygen saturation to 85% on 2 L.  Although improving but still not back to baseline and significantly wheezy on examination.  She thinks she is very close to baseline but not at baseline yet.  She feels more comfortable with another day of hospitalization and current management.  I will continue current Solu-Medrol, bronchodilators and antibiotics.  Incentive spirometry was ordered 4 days ago but has not been provided to the patient.  Discussed with RN personally in person today and requested to provide her with incentive spirometry.  Patient was advised to use incentive spirometry every hour, stay in the chair unless she has to sleep and walk 3 times in the hallway today.  Nurse has been advised of the plan and will make sure that these things happen today.  Acute on chronic  congestive heart failure with preserved ejection fraction: Patient's BNP was slightly elevated and she had +3 pitting edema on examination yesterday.  Concern for acute on chronic congestive heart failure, echo shows 50-55% ejection fraction, previously it was 60 to 65%.  Good diuresis yesterday again with net - 8.9 L and edema has improved..  Continue IV Lasix.  Essential hypertension: Blood pressure fluctuating but now fairly controlled.  Continue home regimen and monitor closely.  Non-insulin-dependent type 2 diabetes mellitus: Blood sugar much better controlled now.  Continue Semglee 10 units, Jardiance and SSI.  Bipolar disorder/generalized anxiety disorder: Continue Abilify.  Hyperlipidemia: Continue Lipitor.  History of cryptogenic CVA: Continue aspirin and Lipitor.  Peripheral neuropathy: Continue gabapentin.  GERD: Continue PPI.  Hypokalemia: Resolved.  CKD stage IIIa: Stable.  Morbid obesity: Weight loss and dietary modification counseled.  She is a good candidate for Ozempic.  Follow-up with PCP as outpatient.  DVT prophylaxis: SCDs Start: 02/25/23 0037 will start on Lovenox   Code Status: Full Code  Family Communication:  None present at bedside.  Plan of care discussed with patient in length and he/she verbalized understanding and agreed with it.  Status is: Inpatient Remains inpatient appropriate because: Patient still dyspneic and symptomatic.   Estimated body mass index is 51.24 kg/m as calculated from the following:   Height as of this encounter: 5' (1.524 m).   Weight as of this encounter: 119 kg.    Nutritional Assessment: Body mass index is 51.24 kg/m.Marland Kitchen Seen by dietician.  I agree with the assessment and plan as outlined below: Nutrition Status:        .  Skin Assessment: I have examined the patient's skin and I agree with the wound assessment as performed by the wound care RN as outlined below:    Consultants:  None  Procedures:   None  Antimicrobials:  Anti-infectives (From admission, onward)    Start     Dose/Rate Route Frequency Ordered Stop   02/26/23 0600  azithromycin (ZITHROMAX) tablet 500 mg        500 mg Oral Daily 02/25/23 0036 03/01/23 0605   02/25/23 0000  cefTRIAXone (ROCEPHIN) 1 g in sodium chloride 0.9 % 100 mL IVPB        1 g 200 mL/hr over 30 Minutes Intravenous  Once 02/24/23 2345 02/25/23 0049   02/25/23 0000  azithromycin (ZITHROMAX) 500 mg in sodium chloride 0.9 % 250 mL IVPB        500 mg 250 mL/hr over 60 Minutes Intravenous  Once 02/24/23 2345 02/25/23 0234         Subjective: Patient seen and examined.  Still wheezy and dyspneic.  Will continue management today and hope to discharge home tomorrow.  Objective: Vitals:   02/28/23 1415 02/28/23 1928 03/01/23 0435 03/01/23 0946  BP: 122/88 136/78 105/61 (!) 101/54  Pulse: 88 73 71 77  Resp: 16 18 16 14   Temp: 97.8 F (36.6 C) 98.1 F (36.7 C) 97.7 F (36.5 C) 97.7 F (36.5 C)  TempSrc: Oral  Oral   SpO2: 97% 94% 97% 94%  Weight:   119 kg   Height:        Intake/Output Summary (Last 24 hours) at 03/01/2023 1045 Last data filed at 03/01/2023 0919 Gross per 24 hour  Intake 840 ml  Output 3700 ml  Net -2860 ml   Filed Weights   02/27/23 0500 02/28/23 0111 03/01/23 0435  Weight: 124.4 kg 123.6 kg 119 kg    Examination:  General exam: Appears calm and comfortable, morbidly obese Respiratory system: Clear to auscultation. Respiratory effort normal. Cardiovascular system: S1 & S2 heard, RRR. No JVD, murmurs, rubs, gallops or clicks.  Trace pitting edema bilateral lower extremity. Gastrointestinal system: Abdomen is nondistended, soft and nontender. No organomegaly or masses felt. Normal bowel sounds heard. Central nervous system: Alert and oriented. No focal neurological deficits. Extremities: Symmetric 5 x 5 power. Skin: No rashes, lesions or ulcers.  Psychiatry: Judgement and insight appear normal. Mood & affect  appropriate.    Data Reviewed: I have personally reviewed following labs and imaging studies  CBC: Recent Labs  Lab 02/24/23 2220 02/25/23 0255 02/26/23 0523 02/28/23 0913  WBC 13.4* 13.8* 15.0* 19.3*  NEUTROABS 9.7*  --   --  14.9*  HGB 13.4 13.3 14.3 15.4*  HCT 43.7 43.3 47.1* 49.8*  MCV 95.6 94.1 96.1 92.4  PLT 205 228 249 277   Basic Metabolic Panel: Recent Labs  Lab 02/24/23 2220 02/25/23 0255 02/26/23 0523 02/28/23 0913  NA 139 138 140 137  K 3.7 3.3* 4.0 4.1  CL 103 98 96* 95*  CO2 28 29 33* 29  GLUCOSE 105* 214* 164* 183*  BUN 11 14 19  36*  CREATININE 0.91 1.09* 1.11* 1.05*  CALCIUM 8.6* 8.5* 8.9 8.9   GFR: Estimated Creatinine Clearance: 64 mL/min (A) (by C-G formula based on SCr of 1.05 mg/dL (H)). Liver Function Tests: Recent Labs  Lab 02/25/23 0255 02/26/23 0523  AST 17 16  ALT 19 20  ALKPHOS 99 100  BILITOT 0.5 0.5  PROT 7.4 7.5  ALBUMIN 3.4* 3.4*   No results for input(s): "  LIPASE", "AMYLASE" in the last 168 hours. No results for input(s): "AMMONIA" in the last 168 hours. Coagulation Profile: No results for input(s): "INR", "PROTIME" in the last 168 hours. Cardiac Enzymes: No results for input(s): "CKTOTAL", "CKMB", "CKMBINDEX", "TROPONINI" in the last 168 hours. BNP (last 3 results) Recent Labs    10/14/22 1619 01/14/23 1036  PROBNP 19.0 34.0   HbA1C: No results for input(s): "HGBA1C" in the last 72 hours. CBG: Recent Labs  Lab 02/28/23 0817 02/28/23 1135 02/28/23 1618 02/28/23 2106 03/01/23 0711  GLUCAP 187* 171* 160* 205* 161*   Lipid Profile: No results for input(s): "CHOL", "HDL", "LDLCALC", "TRIG", "CHOLHDL", "LDLDIRECT" in the last 72 hours. Thyroid Function Tests: No results for input(s): "TSH", "T4TOTAL", "FREET4", "T3FREE", "THYROIDAB" in the last 72 hours. Anemia Panel: No results for input(s): "VITAMINB12", "FOLATE", "FERRITIN", "TIBC", "IRON", "RETICCTPCT" in the last 72 hours. Sepsis Labs: No results for  input(s): "PROCALCITON", "LATICACIDVEN" in the last 168 hours.  Recent Results (from the past 240 hour(s))  SARS Coronavirus 2 by RT PCR (hospital order, performed in [facility] hospital lab) *cepheid single result test* Anterior Nasal Swab     Status: None   Collection Time: 02/24/23  9:59 PM   Specimen: Anterior Nasal Swab  Result Value Ref Range Status   SARS Coronavirus 2 by RT PCR NEGATIVE NEGATIVE Final    Comment: (NOTE) SARS-CoV-2 target nucleic acids are NOT DETECTED.  The SARS-CoV-2 RNA is generally detectable in upper and lower respiratory specimens during the acute phase of infection. The lowest concentration of SARS-CoV-2 viral copies this assay can detect is 250 copies / mL. A negative result does not preclude SARS-CoV-2 infection and should not be used as the sole basis for treatment or other patient management decisions.  A negative result may occur with improper specimen collection / handling, submission of specimen other than nasopharyngeal swab, presence of viral mutation(s) within the areas targeted by this assay, and inadequate number of viral copies (<250 copies / mL). A negative result must be combined with clinical observations, patient history, and epidemiological information.  Fact Sheet for Patients:   RoadLapTop.co.za  Fact Sheet for Healthcare Providers: http://kim-miller.com/  This test is not yet approved or  cleared by the [place] FDA and has been authorized for detection and/or diagnosis of SARS-CoV-2 by FDA under an Emergency Use Authorization (EUA).  This EUA will remain in effect (meaning this test can be used) for the duration of the COVID-19 declaration under Section 564(b)(1) of the Act, 21 U.S.C. section 360bbb-3(b)(1), unless the authorization is terminated or revoked sooner.  Performed at [facility], [address], [place], [place] [address]      Radiology  Studies: No results found.  Scheduled Meds:  ARIPiprazole  10 mg Oral Daily   aspirin EC  81 mg Oral Daily   atorvastatin  80 mg Oral Daily   empagliflozin  10 mg Oral Daily   enoxaparin (LOVENOX) injection  60 mg Subcutaneous Q24H   escitalopram  20 mg Oral Daily   furosemide  40 mg Intravenous BID   gabapentin  600 mg Oral q AM   And   gabapentin  900 mg Oral QHS   insulin aspart  0-5 Units Subcutaneous QHS   insulin aspart  0-6 Units Subcutaneous TID WC   insulin glargine-yfgn  10 Units Subcutaneous Daily   ipratropium-albuterol  3 mL Nebulization TID   loratadine  10 mg Oral Daily   metoprolol succinate  25 mg Oral  Daily   pantoprazole  40 mg Oral BID   sacubitril-valsartan  1 tablet Oral BID   sodium chloride flush  3 mL Intravenous Q12H   spironolactone  25 mg Oral Daily   Continuous Infusions:  sodium chloride       LOS: 4 days   Hughie Closs, MD [facility]  03/01/2023, 10:45 AM   *Please note that this is a verbal dictation therefore any spelling or grammatical errors are due to the "Dragon Medical One" system interpretation.  Please page via Amion and do not message via secure chat for urgent patient care matters. Secure chat can be used for non urgent patient care matters.  How to contact the [facility] Attending or Consulting provider 7A - 7P or covering provider during after hours 7P -7A, for this patient?  Check the care team in [facility] and look for a) attending/consulting TRH provider listed and b) the [facility] team listed. Page or secure chat 7A-7P. Log into [email or web address] and use [place]'s universal password to access. If you do not have the password, please contact the hospital operator. Locate the [facility] provider you are looking for under [facility] and page to a number that you can be directly reached. If you still have difficulty reaching the provider, please page the [facility] (Director on Call) for the Hospitalists listed on amion for assistance.

## 2023-03-01 NOTE — Progress Notes (Signed)
Pt refuses BIPAP. 

## 2023-03-02 DIAGNOSIS — K219 Gastro-esophageal reflux disease without esophagitis: Secondary | ICD-10-CM | POA: Diagnosis not present

## 2023-03-02 DIAGNOSIS — J441 Chronic obstructive pulmonary disease with (acute) exacerbation: Secondary | ICD-10-CM | POA: Diagnosis not present

## 2023-03-02 DIAGNOSIS — J9621 Acute and chronic respiratory failure with hypoxia: Secondary | ICD-10-CM | POA: Diagnosis not present

## 2023-03-02 DIAGNOSIS — I10 Essential (primary) hypertension: Secondary | ICD-10-CM | POA: Diagnosis not present

## 2023-03-02 LAB — GLUCOSE, CAPILLARY: Glucose-Capillary: 157 mg/dL — ABNORMAL HIGH (ref 70–99)

## 2023-03-02 MED ORDER — PREDNISONE 50 MG PO TABS: 50.0000 mg | ORAL_TABLET | Freq: Every day | ORAL | 0 refills | Status: AC

## 2023-03-02 NOTE — Discharge Summary (Signed)
tablet (50 mg total) by mouth 2 (two) times daily as needed for moderate pain.   Trulicity 0.75 MG/0.5ML Sopn Generic drug: Dulaglutide Inject 0.75 mg into the skin once a week.   Vitamin D3 25 MCG (1000 UT) Caps Take 1 capsule (1,000 Units total) by mouth daily.        Follow-up Information     [name], MD Follow up in 1 week(s).   Specialty: Family Medicine Contact information: [address] [place] [address] [phone]         [name], MD Follow up in 1 week(s).   Specialties: Cardiology, Internal Medicine, Radiology Contact information: [address] [place] [address] [phone]                Allergies  Allergen Reactions   Metolazone Other (See Comments)    Metabolic mood swings    Consultations: None   Procedures/Studies: ECHOCARDIOGRAM COMPLETE  Result  Date: 02/25/2023    ECHOCARDIOGRAM REPORT   Patient Name:   Brenda [name] Date of Exam: 02/25/2023 Medical Rec #:  [id number]     Height:       60.0 in Accession #:    [id number]    Weight:       260.0 lb Date of Birth:  11-Dec-1960     BSA:          2.087 m Patient Age:    62 years      BP:           125/59 mmHg Patient Gender: F             HR:           70 bpm. Exam Location:  Inpatient Procedure: 2D Echo, Color Doppler, Cardiac Doppler and Intracardiac            Opacification Agent Indications:    I50.31 Acute diastolic (congestive) heart failure  History:        Patient has prior history of Echocardiogram examinations, most                 recent 05/09/2021. CHF, COPD; Risk Factors:Hypertension,                 Diabetes, Dyslipidemia and Sleep Apnea.  Sonographer:    Irving Burton Senior RDCS Referring Phys: [id number] Dior Dominik  Sonographer Comments: Very poor echo windows due to body habitus and COPD IMPRESSIONS  1. Left ventricular ejection fraction, by estimation, is 50 to 55%. The left ventricle has low normal function. The left ventricle demonstrates global hypokinesis. Left ventricular diastolic parameters are indeterminate.  2. Right ventricular systolic function is moderately reduced. The right ventricular size is moderately enlarged.  3. The mitral valve is normal in structure. No evidence of mitral valve regurgitation. No evidence of mitral stenosis.  4. The aortic valve is normal in structure. Aortic valve regurgitation is not visualized. No aortic stenosis is present.  5. The inferior vena cava is normal in size with greater than 50% respiratory variability, suggesting right atrial pressure of 3 mmHg. FINDINGS  Left Ventricle: Left ventricular ejection fraction, by estimation, is 50 to 55%. The left ventricle has low normal function. The left ventricle demonstrates global hypokinesis. Definity contrast agent was given IV to delineate the left ventricular endocardial borders. The left ventricular internal  cavity size was normal in size. There is no left ventricular hypertrophy. Left ventricular diastolic parameters are indeterminate. Right Ventricle: The right ventricular size is moderately enlarged.  Physician Discharge Summary  Brenda [name] KZS:[id number] DOB: 07-25-1960 DOA: 02/24/2023  PCP: [name], MD  Admit date: 02/24/2023 Discharge date: 03/02/2023 30 Day Unplanned Readmission Risk Score    Flowsheet Row ED to Hosp-Admission (Current) from 02/24/2023 in [facility]  30 Day Unplanned Readmission Risk Score (%) 25.73 Filed at 03/02/2023 0801       This score is the patient's risk of an unplanned readmission within 30 days of being discharged (0 -100%). The score is based on dignosis, age, lab data, medications, orders, and past utilization.   Low:  0-14.9   Medium: 15-21.9   High: 22-29.9   Extreme: 30 and above          Admitted From: Home Disposition: Home  Recommendations for Outpatient Follow-up:  Follow up with PCP in 1-2 weeks Please obtain BMP/CBC in one week Follow up with your cardiologist in 1 to 2 weeks. Please follow up with your PCP on the following pending results: Unresulted Labs (From admission, onward)    None         Home Health: None Equipment/Devices: None   Discharge Condition: Stable CODE STATUS: Full code Diet recommendation: Low-sodium  Subjective: Seen and examined.  She says that she feels a lot better and back to baseline and she feels very comfortable going home today.  Brief/Interim Summary: This is a 62 year old pleasant lady with a history of CAD, systolic CHF, COPD, chronic hypoxic respiratory failure dependent on 2 L of oxygen at baseline, essential hypertension and many other comorbidities who was admitted under hospitalist service for acute COPD exacerbation and chronic hypoxic respiratory failure.  Details below.   Acute on chronic hypoxic respiratory failure secondary to acute COPD exacerbation: Patient uses 2 L of oxygen constantly at home.  She required 4 L initially but has been weaned down to 2 L now.  She was started on Solu-Medrol, antibiotics and bronchodilators.  It took  5 days for patient to feel back to baseline and she has no more wheezes today and feels comfortable going home.  I will discharge her on 3 more days of oral prednisone.  She completed 5 days of antibiotics.  Resume prior to home medications.     Acute on chronic congestive heart failure with preserved ejection fraction: Patient's BNP was slightly elevated and she had +3 pitting edema on examination yesterday.  Concern for acute on chronic congestive heart failure, echo shows 50-55% ejection fraction, previously it was 60 to 65%.  She good diuresis and she is net negative 9.9 L and edema has improved.  Resume PTA diuretics and medications and follow-up with cardiologist in 1 to 2 weeks.   Essential hypertension: Blood pressure fluctuating but now fairly controlled.  Continue home regimen and monitor closely.   Non-insulin-dependent type 2 diabetes mellitus: Blood sugar were elevated due to receiving steroids.  She was covered with long-acting insulin.  Will resume PTA home medications at discharge.   Bipolar disorder/generalized anxiety disorder: Continue Abilify.   Hyperlipidemia: Continue Lipitor.   History of cryptogenic CVA: Continue aspirin and Lipitor.   Peripheral neuropathy: Continue gabapentin.   GERD: Continue PPI.   Hypokalemia: Resolved.   CKD stage IIIa: Stable.   Morbid obesity: Weight loss and dietary modification counseled.  She is a good candidate for Ozempic.  Follow-up with PCP as outpatient.  Discharge Diagnoses:  Active Problems:   COPD with acute exacerbation (HCC)   Bipolar disorder (HCC)   Essential hypertension  tablet (50 mg total) by mouth 2 (two) times daily as needed for moderate pain.   Trulicity 0.75 MG/0.5ML Sopn Generic drug: Dulaglutide Inject 0.75 mg into the skin once a week.   Vitamin D3 25 MCG (1000 UT) Caps Take 1 capsule (1,000 Units total) by mouth daily.        Follow-up Information     [name], MD Follow up in 1 week(s).   Specialty: Family Medicine Contact information: [address] [place] [address] [phone]         [name], MD Follow up in 1 week(s).   Specialties: Cardiology, Internal Medicine, Radiology Contact information: [address] [place] [address] [phone]                Allergies  Allergen Reactions   Metolazone Other (See Comments)    Metabolic mood swings    Consultations: None   Procedures/Studies: ECHOCARDIOGRAM COMPLETE  Result  Date: 02/25/2023    ECHOCARDIOGRAM REPORT   Patient Name:   Brenda [name] Date of Exam: 02/25/2023 Medical Rec #:  [id number]     Height:       60.0 in Accession #:    [id number]    Weight:       260.0 lb Date of Birth:  11-Dec-1960     BSA:          2.087 m Patient Age:    62 years      BP:           125/59 mmHg Patient Gender: F             HR:           70 bpm. Exam Location:  Inpatient Procedure: 2D Echo, Color Doppler, Cardiac Doppler and Intracardiac            Opacification Agent Indications:    I50.31 Acute diastolic (congestive) heart failure  History:        Patient has prior history of Echocardiogram examinations, most                 recent 05/09/2021. CHF, COPD; Risk Factors:Hypertension,                 Diabetes, Dyslipidemia and Sleep Apnea.  Sonographer:    Irving Burton Senior RDCS Referring Phys: [id number] Dior Dominik  Sonographer Comments: Very poor echo windows due to body habitus and COPD IMPRESSIONS  1. Left ventricular ejection fraction, by estimation, is 50 to 55%. The left ventricle has low normal function. The left ventricle demonstrates global hypokinesis. Left ventricular diastolic parameters are indeterminate.  2. Right ventricular systolic function is moderately reduced. The right ventricular size is moderately enlarged.  3. The mitral valve is normal in structure. No evidence of mitral valve regurgitation. No evidence of mitral stenosis.  4. The aortic valve is normal in structure. Aortic valve regurgitation is not visualized. No aortic stenosis is present.  5. The inferior vena cava is normal in size with greater than 50% respiratory variability, suggesting right atrial pressure of 3 mmHg. FINDINGS  Left Ventricle: Left ventricular ejection fraction, by estimation, is 50 to 55%. The left ventricle has low normal function. The left ventricle demonstrates global hypokinesis. Definity contrast agent was given IV to delineate the left ventricular endocardial borders. The left ventricular internal  cavity size was normal in size. There is no left ventricular hypertrophy. Left ventricular diastolic parameters are indeterminate. Right Ventricle: The right ventricular size is moderately enlarged.  tablet (50 mg total) by mouth 2 (two) times daily as needed for moderate pain.   Trulicity 0.75 MG/0.5ML Sopn Generic drug: Dulaglutide Inject 0.75 mg into the skin once a week.   Vitamin D3 25 MCG (1000 UT) Caps Take 1 capsule (1,000 Units total) by mouth daily.        Follow-up Information     [name], MD Follow up in 1 week(s).   Specialty: Family Medicine Contact information: [address] [place] [address] [phone]         [name], MD Follow up in 1 week(s).   Specialties: Cardiology, Internal Medicine, Radiology Contact information: [address] [place] [address] [phone]                Allergies  Allergen Reactions   Metolazone Other (See Comments)    Metabolic mood swings    Consultations: None   Procedures/Studies: ECHOCARDIOGRAM COMPLETE  Result  Date: 02/25/2023    ECHOCARDIOGRAM REPORT   Patient Name:   Brenda [name] Date of Exam: 02/25/2023 Medical Rec #:  [id number]     Height:       60.0 in Accession #:    [id number]    Weight:       260.0 lb Date of Birth:  11-Dec-1960     BSA:          2.087 m Patient Age:    62 years      BP:           125/59 mmHg Patient Gender: F             HR:           70 bpm. Exam Location:  Inpatient Procedure: 2D Echo, Color Doppler, Cardiac Doppler and Intracardiac            Opacification Agent Indications:    I50.31 Acute diastolic (congestive) heart failure  History:        Patient has prior history of Echocardiogram examinations, most                 recent 05/09/2021. CHF, COPD; Risk Factors:Hypertension,                 Diabetes, Dyslipidemia and Sleep Apnea.  Sonographer:    Irving Burton Senior RDCS Referring Phys: [id number] Dior Dominik  Sonographer Comments: Very poor echo windows due to body habitus and COPD IMPRESSIONS  1. Left ventricular ejection fraction, by estimation, is 50 to 55%. The left ventricle has low normal function. The left ventricle demonstrates global hypokinesis. Left ventricular diastolic parameters are indeterminate.  2. Right ventricular systolic function is moderately reduced. The right ventricular size is moderately enlarged.  3. The mitral valve is normal in structure. No evidence of mitral valve regurgitation. No evidence of mitral stenosis.  4. The aortic valve is normal in structure. Aortic valve regurgitation is not visualized. No aortic stenosis is present.  5. The inferior vena cava is normal in size with greater than 50% respiratory variability, suggesting right atrial pressure of 3 mmHg. FINDINGS  Left Ventricle: Left ventricular ejection fraction, by estimation, is 50 to 55%. The left ventricle has low normal function. The left ventricle demonstrates global hypokinesis. Definity contrast agent was given IV to delineate the left ventricular endocardial borders. The left ventricular internal  cavity size was normal in size. There is no left ventricular hypertrophy. Left ventricular diastolic parameters are indeterminate. Right Ventricle: The right ventricular size is moderately enlarged.  Physician Discharge Summary  Brenda [name] KZS:[id number] DOB: 07-25-1960 DOA: 02/24/2023  PCP: [name], MD  Admit date: 02/24/2023 Discharge date: 03/02/2023 30 Day Unplanned Readmission Risk Score    Flowsheet Row ED to Hosp-Admission (Current) from 02/24/2023 in [facility]  30 Day Unplanned Readmission Risk Score (%) 25.73 Filed at 03/02/2023 0801       This score is the patient's risk of an unplanned readmission within 30 days of being discharged (0 -100%). The score is based on dignosis, age, lab data, medications, orders, and past utilization.   Low:  0-14.9   Medium: 15-21.9   High: 22-29.9   Extreme: 30 and above          Admitted From: Home Disposition: Home  Recommendations for Outpatient Follow-up:  Follow up with PCP in 1-2 weeks Please obtain BMP/CBC in one week Follow up with your cardiologist in 1 to 2 weeks. Please follow up with your PCP on the following pending results: Unresulted Labs (From admission, onward)    None         Home Health: None Equipment/Devices: None   Discharge Condition: Stable CODE STATUS: Full code Diet recommendation: Low-sodium  Subjective: Seen and examined.  She says that she feels a lot better and back to baseline and she feels very comfortable going home today.  Brief/Interim Summary: This is a 62 year old pleasant lady with a history of CAD, systolic CHF, COPD, chronic hypoxic respiratory failure dependent on 2 L of oxygen at baseline, essential hypertension and many other comorbidities who was admitted under hospitalist service for acute COPD exacerbation and chronic hypoxic respiratory failure.  Details below.   Acute on chronic hypoxic respiratory failure secondary to acute COPD exacerbation: Patient uses 2 L of oxygen constantly at home.  She required 4 L initially but has been weaned down to 2 L now.  She was started on Solu-Medrol, antibiotics and bronchodilators.  It took  5 days for patient to feel back to baseline and she has no more wheezes today and feels comfortable going home.  I will discharge her on 3 more days of oral prednisone.  She completed 5 days of antibiotics.  Resume prior to home medications.     Acute on chronic congestive heart failure with preserved ejection fraction: Patient's BNP was slightly elevated and she had +3 pitting edema on examination yesterday.  Concern for acute on chronic congestive heart failure, echo shows 50-55% ejection fraction, previously it was 60 to 65%.  She good diuresis and she is net negative 9.9 L and edema has improved.  Resume PTA diuretics and medications and follow-up with cardiologist in 1 to 2 weeks.   Essential hypertension: Blood pressure fluctuating but now fairly controlled.  Continue home regimen and monitor closely.   Non-insulin-dependent type 2 diabetes mellitus: Blood sugar were elevated due to receiving steroids.  She was covered with long-acting insulin.  Will resume PTA home medications at discharge.   Bipolar disorder/generalized anxiety disorder: Continue Abilify.   Hyperlipidemia: Continue Lipitor.   History of cryptogenic CVA: Continue aspirin and Lipitor.   Peripheral neuropathy: Continue gabapentin.   GERD: Continue PPI.   Hypokalemia: Resolved.   CKD stage IIIa: Stable.   Morbid obesity: Weight loss and dietary modification counseled.  She is a good candidate for Ozempic.  Follow-up with PCP as outpatient.  Discharge Diagnoses:  Active Problems:   COPD with acute exacerbation (HCC)   Bipolar disorder (HCC)   Essential hypertension  Physician Discharge Summary  Brenda [name] KZS:[id number] DOB: 07-25-1960 DOA: 02/24/2023  PCP: [name], MD  Admit date: 02/24/2023 Discharge date: 03/02/2023 30 Day Unplanned Readmission Risk Score    Flowsheet Row ED to Hosp-Admission (Current) from 02/24/2023 in [facility]  30 Day Unplanned Readmission Risk Score (%) 25.73 Filed at 03/02/2023 0801       This score is the patient's risk of an unplanned readmission within 30 days of being discharged (0 -100%). The score is based on dignosis, age, lab data, medications, orders, and past utilization.   Low:  0-14.9   Medium: 15-21.9   High: 22-29.9   Extreme: 30 and above          Admitted From: Home Disposition: Home  Recommendations for Outpatient Follow-up:  Follow up with PCP in 1-2 weeks Please obtain BMP/CBC in one week Follow up with your cardiologist in 1 to 2 weeks. Please follow up with your PCP on the following pending results: Unresulted Labs (From admission, onward)    None         Home Health: None Equipment/Devices: None   Discharge Condition: Stable CODE STATUS: Full code Diet recommendation: Low-sodium  Subjective: Seen and examined.  She says that she feels a lot better and back to baseline and she feels very comfortable going home today.  Brief/Interim Summary: This is a 62 year old pleasant lady with a history of CAD, systolic CHF, COPD, chronic hypoxic respiratory failure dependent on 2 L of oxygen at baseline, essential hypertension and many other comorbidities who was admitted under hospitalist service for acute COPD exacerbation and chronic hypoxic respiratory failure.  Details below.   Acute on chronic hypoxic respiratory failure secondary to acute COPD exacerbation: Patient uses 2 L of oxygen constantly at home.  She required 4 L initially but has been weaned down to 2 L now.  She was started on Solu-Medrol, antibiotics and bronchodilators.  It took  5 days for patient to feel back to baseline and she has no more wheezes today and feels comfortable going home.  I will discharge her on 3 more days of oral prednisone.  She completed 5 days of antibiotics.  Resume prior to home medications.     Acute on chronic congestive heart failure with preserved ejection fraction: Patient's BNP was slightly elevated and she had +3 pitting edema on examination yesterday.  Concern for acute on chronic congestive heart failure, echo shows 50-55% ejection fraction, previously it was 60 to 65%.  She good diuresis and she is net negative 9.9 L and edema has improved.  Resume PTA diuretics and medications and follow-up with cardiologist in 1 to 2 weeks.   Essential hypertension: Blood pressure fluctuating but now fairly controlled.  Continue home regimen and monitor closely.   Non-insulin-dependent type 2 diabetes mellitus: Blood sugar were elevated due to receiving steroids.  She was covered with long-acting insulin.  Will resume PTA home medications at discharge.   Bipolar disorder/generalized anxiety disorder: Continue Abilify.   Hyperlipidemia: Continue Lipitor.   History of cryptogenic CVA: Continue aspirin and Lipitor.   Peripheral neuropathy: Continue gabapentin.   GERD: Continue PPI.   Hypokalemia: Resolved.   CKD stage IIIa: Stable.   Morbid obesity: Weight loss and dietary modification counseled.  She is a good candidate for Ozempic.  Follow-up with PCP as outpatient.  Discharge Diagnoses:  Active Problems:   COPD with acute exacerbation (HCC)   Bipolar disorder (HCC)   Essential hypertension  Physician Discharge Summary  Brenda [name] KZS:[id number] DOB: 07-25-1960 DOA: 02/24/2023  PCP: [name], MD  Admit date: 02/24/2023 Discharge date: 03/02/2023 30 Day Unplanned Readmission Risk Score    Flowsheet Row ED to Hosp-Admission (Current) from 02/24/2023 in [facility]  30 Day Unplanned Readmission Risk Score (%) 25.73 Filed at 03/02/2023 0801       This score is the patient's risk of an unplanned readmission within 30 days of being discharged (0 -100%). The score is based on dignosis, age, lab data, medications, orders, and past utilization.   Low:  0-14.9   Medium: 15-21.9   High: 22-29.9   Extreme: 30 and above          Admitted From: Home Disposition: Home  Recommendations for Outpatient Follow-up:  Follow up with PCP in 1-2 weeks Please obtain BMP/CBC in one week Follow up with your cardiologist in 1 to 2 weeks. Please follow up with your PCP on the following pending results: Unresulted Labs (From admission, onward)    None         Home Health: None Equipment/Devices: None   Discharge Condition: Stable CODE STATUS: Full code Diet recommendation: Low-sodium  Subjective: Seen and examined.  She says that she feels a lot better and back to baseline and she feels very comfortable going home today.  Brief/Interim Summary: This is a 62 year old pleasant lady with a history of CAD, systolic CHF, COPD, chronic hypoxic respiratory failure dependent on 2 L of oxygen at baseline, essential hypertension and many other comorbidities who was admitted under hospitalist service for acute COPD exacerbation and chronic hypoxic respiratory failure.  Details below.   Acute on chronic hypoxic respiratory failure secondary to acute COPD exacerbation: Patient uses 2 L of oxygen constantly at home.  She required 4 L initially but has been weaned down to 2 L now.  She was started on Solu-Medrol, antibiotics and bronchodilators.  It took  5 days for patient to feel back to baseline and she has no more wheezes today and feels comfortable going home.  I will discharge her on 3 more days of oral prednisone.  She completed 5 days of antibiotics.  Resume prior to home medications.     Acute on chronic congestive heart failure with preserved ejection fraction: Patient's BNP was slightly elevated and she had +3 pitting edema on examination yesterday.  Concern for acute on chronic congestive heart failure, echo shows 50-55% ejection fraction, previously it was 60 to 65%.  She good diuresis and she is net negative 9.9 L and edema has improved.  Resume PTA diuretics and medications and follow-up with cardiologist in 1 to 2 weeks.   Essential hypertension: Blood pressure fluctuating but now fairly controlled.  Continue home regimen and monitor closely.   Non-insulin-dependent type 2 diabetes mellitus: Blood sugar were elevated due to receiving steroids.  She was covered with long-acting insulin.  Will resume PTA home medications at discharge.   Bipolar disorder/generalized anxiety disorder: Continue Abilify.   Hyperlipidemia: Continue Lipitor.   History of cryptogenic CVA: Continue aspirin and Lipitor.   Peripheral neuropathy: Continue gabapentin.   GERD: Continue PPI.   Hypokalemia: Resolved.   CKD stage IIIa: Stable.   Morbid obesity: Weight loss and dietary modification counseled.  She is a good candidate for Ozempic.  Follow-up with PCP as outpatient.  Discharge Diagnoses:  Active Problems:   COPD with acute exacerbation (HCC)   Bipolar disorder (HCC)   Essential hypertension

## 2023-03-02 NOTE — Plan of Care (Signed)
  Problem: Education: Goal: Knowledge of disease or condition will improve Outcome: Progressing   Problem: Activity: Goal: Will verbalize the importance of balancing activity with adequate rest periods Outcome: Progressing   Problem: Respiratory: Goal: Ability to maintain a clear airway will improve Outcome: Progressing

## 2023-03-03 NOTE — Progress Notes (Signed)
Carelink Summary Report / Loop Recorder 

## 2023-03-05 ENCOUNTER — Telehealth: Payer: Self-pay | Admitting: *Deleted

## 2023-03-05 NOTE — Transitions of Care (Post Inpatient/ED Visit) (Signed)
03/05/2023  Name: Brenda [name] MRN: [id number] DOB: 02/15/1961  Today's TOC FU Call Status: Today's TOC FU Call Status:: Successful TOC FU Call Completed TOC FU Call Complete Date: 03/05/23 Patient's Name and Date of Birth confirmed.  Transition Care Management Follow-up Telephone Call Date of Discharge: 03/02/23 Discharge Facility: [facility]) Type of Discharge: Inpatient Admission Primary Inpatient Discharge Diagnosis:: COPD exacerbation/ chronic hypoxic respiratory failure How have you been since you were released from the hospital?: Better ("I am doing much better and not having any problems- using my oxygen, nebulizers and inhalers.  I don't want to get a sooner appointment with Dr. Sharen Hones, I want to wait until 03/16/23 like it is already scheduled, I think I will be fine until then.") Any questions or concerns?: No  Items Reviewed: Did you receive and understand the discharge instructions provided?: No (thoroughly reviewed with patient who verbalizes good understanding of same) Medications obtained,verified, and reconciled?: Yes (Medications Reviewed) (Full medication reconciliation/ review completed; no concerns or discrepancies identified; confirmed patient obtained/ is taking all newly Rx'd medications as instructed; self-manages medications and denies questions/ concerns around medications today) Any new allergies since your discharge?: No Dietary orders reviewed?: Yes Type of Diet Ordered:: Heart Healthy Low salt Do you have support at home?: Yes People in Home: parent(s) Name of Support/Comfort Primary Source: Reports independent in self-care activities; resides with supportive father-- assists as/ if needed/ indicated; local sister also assists as needed  Medications Reviewed Today: Medications Reviewed Today     Reviewed by Michaela Corner, RN (Registered Nurse) on 03/05/23 at 1348  Med List Status: <None>   Medication Order Taking? Sig Documenting Provider  Last Dose Status Informant  albuterol (PROVENTIL) (2.5 MG/3ML) 0.083% nebulizer solution [id number] Yes INHALE 1 VIAL VIA NEBULIZER EVERY 6 HOURS AS NEEDED FOR WHEEZING/SHORTNESS OF Aron Baba, MD Taking Active Self  albuterol (VENTOLIN HFA) 108 (90 Base) MCG/ACT inhaler [id number] Yes TAKE 2 PUFFS BY MOUTH EVERY 6 HOURS AS NEEDED FOR WHEEZE OR SHORTNESS OF Aron Baba, MD Taking Active Self  ARIPiprazole (ABILIFY) 10 MG tablet [id number] Yes Take 1 tablet (10 mg total) by mouth daily. [name], MD Taking Active Self  aspirin EC 81 MG EC tablet [id number] Yes Take 1 tablet (81 mg total) by mouth daily. Swallow whole. Narda Bonds, MD Taking Active Self  atorvastatin (LIPITOR) 80 MG tablet [id number] Yes TAKE 1 TABLET BY MOUTH EVERYDAY AT BEDTIME For cholesterol [name], MD Taking Active Self  Budeson-Glycopyrrol-Formoterol (BREZTRI AEROSPHERE) 160-9-4.8 MCG/ACT Sandrea Matte [id number] Yes Inhale 2 puffs into the lungs in the morning and at bedtime. Nyoka Cowden, MD Taking Active Self  cetirizine-pseudoephedrine (ZYRTEC-D) 5-120 MG tablet [id number] Yes Take 1 tablet by mouth every evening. [provider] Taking Active Self  Cholecalciferol (VITAMIN D3) 25 MCG (1000 UT) CAPS [id number] Yes Take 1 capsule (1,000 Units total) by mouth daily. [name], MD Taking Active Self  dextromethorphan-guaiFENesin [facility]) 30-600 MG 12hr tablet [id number] Yes Take 1 tablet by mouth 2 (two) times daily as needed for cough. [provider] Taking Active Self           Med Note Michaela Corner   Fri Mar 05, 2023  1:33 PM) 03/05/23: Reports during TOC call, has not needed recently  Dulaglutide (TRULICITY) 0.75 MG/0.5ML SOPN [id number] Yes Inject 0.75 mg into the skin once a week. [name], MD Taking Active Self  escitalopram (LEXAPRO) 20 MG tablet [id number] Yes Take 1 tablet (20  mg total) by mouth daily. [name], MD Taking Active Self   famotidine (PEPCID) 20 MG tablet [id number] Yes One after supper  Patient taking differently: Take 20 mg by mouth daily.   Nyoka Cowden, MD Taking Active Self  fluticasone [facility]) 50 MCG/ACT nasal spray [id number] Yes PLACE 1 SPRAY INTO BOTH NOSTRILS 2 (TWO) TIMES DAILY [name], MD Taking Active Self  furosemide (LASIX) 20 MG tablet [id number] Yes TAKE 1 TABLET BY MOUTH EVERY DAY [name], MD Taking Active Self  gabapentin (NEURONTIN) 300 MG capsule [id number] Yes Take 2 capsules (600 mg total) by mouth in the morning AND 3 capsules (900 mg total) at bedtime. [name], MD Taking Active Self  JARDIANCE 10 MG TABS tablet [id number] Yes TAKE 1 TABLET BY MOUTH EVERY DAY BEFORE BREAKFAST O'Neal, Ronnald Ramp, MD Taking Active Self  LORazepam (ATIVAN) 0.5 MG tablet [id number] Yes TAKE 1/2 TO 1 TABLET BY MOUTH TWICE A DAY AS NEEDED FOR ANXIETY [name], MD Taking Active Self  metoprolol succinate (TOPROL-XL) 25 MG 24 hr tablet [id number] Yes Take 1 tablet (25 mg total) by mouth daily. [name], MD Taking Active Self  Multiple Vitamin (MULTIVITAMIN WITH MINERALS) TABS tablet [id number] Yes Take 1 tablet by mouth daily. [provider] Taking Active Self  nitroGLYCERIN (NITROSTAT) 0.4 MG SL tablet [id number] Yes Place 1 tablet (0.4 mg total) under the tongue every 5 (five) minutes as needed. Arty Baumgartner, NP Taking Active Self           Med Note Michaela Corner   Fri Mar 05, 2023  1:35 PM) 03/05/23: Reports during TOC call, has not needed recently    pantoprazole (PROTONIX) 40 MG tablet [id number] Yes TAKE 1 TABLET BY MOUTH 2 TIMES DAILY 30-60 MIN BEFORE FIRST MEAL OF THE DAY Glenford Bayley, NP Taking Active Self  predniSONE (DELTASONE) 50 MG tablet [id number] Yes Take 1 tablet (50 mg total) by mouth daily with breakfast for 3 days. Hughie Closs, MD Taking Active            Med Note Michaela Corner   Fri Mar 05, 2023  1:36 PM) 03/05/23:  Reports during TOC call, has completed course as of today- took as prescribed    sacubitril-valsartan (ENTRESTO) 24-26 MG [id number] Yes Take 1 tablet by mouth 2 (two) times daily. [name], MD Taking Active Self  spironolactone (ALDACTONE) 25 MG tablet [id number] Yes Take 1 tablet (25 mg total) by mouth daily. [name], MD Taking Active Self  traMADol Janean Sark) 50 MG tablet [id number] Yes Take 1 tablet (50 mg total) by mouth 2 (two) times daily as needed for moderate pain. [name], MD Taking Active Self           Home Care and Equipment/Supplies: Were Home Health Services Ordered?: No Any new equipment or medical supplies ordered?: No  Functional Questionnaire: Do you need assistance with bathing/showering or dressing?: No Do you need assistance with meal preparation?: No Do you need assistance with eating?: No Do you have difficulty maintaining continence: No Do you need assistance with getting out of bed/getting out of a chair/moving?: No Do you have difficulty managing or taking your medications?: No  Follow up appointments reviewed: PCP Follow-up appointment confirmed?: Yes Date of PCP follow-up appointment?: 03/16/23 (declines my offer to assist with scheduling sooner appointment with APP at practice) Follow-up Provider: PCP Specialist Hospital Follow-up appointment confirmed?: No Reason Specialist Follow-Up Not Confirmed: Patient has Specialist Provider Number  and will Call for Appointment Do you need transportation to your follow-up appointment?: No Do you understand care options if your condition(s) worsen?: Yes-patient verbalized understanding  SDOH Interventions Today    Flowsheet Row Most Recent Value  SDOH Interventions   Food Insecurity Interventions Intervention Not Indicated  Transportation Interventions Intervention Not Indicated  [family provides all transportation]      TOC Interventions Today    Flowsheet Row Most Recent  Value  TOC Interventions   TOC Interventions Discussed/Reviewed TOC Interventions Discussed  [Patient declines need for ongoing/ further care management outreach,  no needs identified at time of TOC call today- declines enrollment in 30-day TOC program,  provided my direct contact information should questions/ concerns/ needs arise post-TOC call]      Interventions Today    Flowsheet Row Most Recent Value  Chronic Disease   Chronic disease during today's visit Other, Chronic Obstructive Pulmonary Disease (COPD), Congestive Heart Failure (CHF)  [chronic hypoxic respiratory failure]  General Interventions   General Interventions Discussed/Reviewed General Interventions Discussed, Durable Medical Equipment (DME), Doctor Visits  Doctor Visits Discussed/Reviewed Doctor Visits Discussed, PCP, Specialist  Durable Medical Equipment (DME) Oxygen, Other  [confirmed not currently requiring/ using assistive devices for ambulation]  PCP/Specialist Visits Compliance with follow-up visit  [encouraged to schedule with cardiology provider for HFU as per hospital discharge instructions]  Education Interventions   Education Provided Provided Education  Provided Verbal Education On Other  [reinforced safe use of home O2]  Nutrition Interventions   Nutrition Discussed/Reviewed Nutrition Discussed  Pharmacy Interventions   Pharmacy Dicussed/Reviewed Pharmacy Topics Discussed  [Full medication review with updating medication list in EHR per patient report: confirmed patient completed course of prednisone as instructed post-hospital discharge]  Safety Interventions   Safety Discussed/Reviewed Safety Discussed, Fall Risk      Caryl Pina, RN, BSN, Media planner  Transitions of Care  [facility]  [place] [phone]: direct office

## 2023-03-08 DIAGNOSIS — G4733 Obstructive sleep apnea (adult) (pediatric): Secondary | ICD-10-CM | POA: Diagnosis not present

## 2023-03-16 ENCOUNTER — Encounter: Payer: Self-pay | Admitting: Family Medicine

## 2023-03-16 ENCOUNTER — Ambulatory Visit: Payer: 59 | Admitting: Family Medicine

## 2023-03-16 VITALS — BP 126/78 | HR 80 | Temp 98.8°F | Ht 60.0 in | Wt 269.0 lb

## 2023-03-16 DIAGNOSIS — J449 Chronic obstructive pulmonary disease, unspecified: Secondary | ICD-10-CM | POA: Diagnosis not present

## 2023-03-16 DIAGNOSIS — J9621 Acute and chronic respiratory failure with hypoxia: Secondary | ICD-10-CM | POA: Diagnosis not present

## 2023-03-16 DIAGNOSIS — J441 Chronic obstructive pulmonary disease with (acute) exacerbation: Secondary | ICD-10-CM

## 2023-03-16 DIAGNOSIS — Z23 Encounter for immunization: Secondary | ICD-10-CM | POA: Diagnosis not present

## 2023-03-16 DIAGNOSIS — I5022 Chronic systolic (congestive) heart failure: Secondary | ICD-10-CM

## 2023-03-16 NOTE — Assessment & Plan Note (Signed)
Recent hospitalization, treated with prednisone course and rocephin/azithromycin antibiotic. Symptoms are improved however with persistent baseline dyspnea, wheeze. She continues Breztri 2 puffs BID with duoneb treatments. Encouraged pulm f/u.

## 2023-03-16 NOTE — Patient Instructions (Addendum)
Flu shot today Labs today  Increase lasix to 2 tablets daily in the morning for 3 days, update [place] with effect.  Schedule follow up visit with the lung doctors.  Keep follow up next month with me.

## 2023-03-16 NOTE — Progress Notes (Incomplete)
Ph: [phone] Fax: [phone]   Patient ID: Brenda [name], female    DOB: 05-16-1961, 62 y.o.   MRN: [id number]  This visit was conducted in person.  BP 126/78   Pulse 80   Temp 98.8 F (37.1 C) (Oral)   Ht 5' (1.524 m)   Wt 269 lb (122 kg)   LMP 03/08/2013   SpO2 96% Comment: 2 L  BMI 52.54 kg/m    CC: hosp f/u visit  Subjective:   HPI: Brenda [name] is a 62 y.o. female presenting on 03/16/2023 for Hospitalization Follow-up (Admitted on 02/24/23 at [facility], dx COPD exacerbation; peripheral edeama; acute bronchitis.)   O2 dependent asthmatic bronchitis/COPD on breztri 2 puffs BID and oxygen at 2L via  followed by pulm Dr Sherene Sires. In process of getting new CPAP for OSA - not currently using CPAP. Known cor pulmonale and CHF. She sleeps on adjustable bed.   Recent hospitalization for acute on chronic hypox respiratory failure caused by COPD exacerbation.  Hospital records reviewed. Med rec performed.  Oxygen increased to 4L on presentation, was able to weaned down to 2L prior to discharge. Treated with solumedrol, antibiotics and bronchodilators.  Discharged on oral prednisone. Completed azithromycin / rocephin course. Marland Kitchen  Also found to have acute on chronic CHF with preserved ejection fraction with 3+ pitting edema, treated with diuresis with net negative 9.9L.   Since home she is feeling better. No fevers/chills, no leg swelling, stable chronic cough and dyspnea and wheeze.  She continues Breztri 2 puffs BID - this is fully covered by her insurance.  She also has been using albuterol neb breathing treatments Q6 hours with PRN albuterol inhaler. Continues zyrtec D PRN.   Doesn't note significant difference/benefit when on steroids  Ex smoker - quit 09/2021.  Home health not set up.  Other follow up appointments scheduled: cardiology 04/06/2023 ______________________________________________________________________ Hospital admission: 02/24/2023 Hospital discharge:  03/02/2023 TCM f/u phone call: performed on 03/05/2023  Recommendations for Outpatient Follow-up:  Follow up with PCP in 1-2 weeks Please obtain BMP/CBC in one week Follow up with your cardiologist in 1 to 2 weeks.  Discharge Diagnoses:  Active Problems:   COPD with acute exacerbation (HCC)   Bipolar disorder (HCC)   Essential hypertension   Non-insulin dependent type 2 diabetes mellitus (HCC)   Hyperlipidemia   History of CVA (cerebrovascular accident)   Peripheral neuropathy   GERD (gastroesophageal reflux disease)   Acute on chronic respiratory failure with hypoxia (HCC)     Relevant past medical, surgical, family and social history reviewed and updated as indicated. Interim medical history since our last visit reviewed. Allergies and medications reviewed and updated. Outpatient Medications Prior to Visit  Medication Sig Dispense Refill  . albuterol (PROVENTIL) (2.5 MG/3ML) 0.083% nebulizer solution INHALE 1 VIAL VIA NEBULIZER EVERY 6 HOURS AS NEEDED FOR WHEEZING/SHORTNESS OF BREATH 150 mL 1  . albuterol (VENTOLIN HFA) 108 (90 Base) MCG/ACT inhaler TAKE 2 PUFFS BY MOUTH EVERY 6 HOURS AS NEEDED FOR WHEEZE OR SHORTNESS OF BREATH 8.5 each 3  . ARIPiprazole (ABILIFY) 10 MG tablet Take 1 tablet (10 mg total) by mouth daily. 90 tablet 4  . aspirin EC 81 MG EC tablet Take 1 tablet (81 mg total) by mouth daily. Swallow whole. 30 tablet 0  . atorvastatin (LIPITOR) 80 MG tablet TAKE 1 TABLET BY MOUTH EVERYDAY AT BEDTIME For cholesterol 90 tablet 4  . Budeson-Glycopyrrol-Formoterol (BREZTRI AEROSPHERE) 160-9-4.8 MCG/ACT AERO Inhale 2 puffs into the lungs in the  morning and at bedtime.    . cetirizine-pseudoephedrine (ZYRTEC-D) 5-120 MG tablet Take 1 tablet by mouth every evening.    . Cholecalciferol (VITAMIN D3) 25 MCG (1000 UT) CAPS Take 1 capsule (1,000 Units total) by mouth daily. 30 capsule   . dextromethorphan-guaiFENesin (MUCINEX DM) 30-600 MG 12hr tablet Take 1 tablet by mouth 2 (two)  times daily as needed for cough.    . Dulaglutide (TRULICITY) 0.75 MG/0.5ML SOPN Inject 0.75 mg into the skin once a week. 6 mL 4  . escitalopram (LEXAPRO) 20 MG tablet Take 1 tablet (20 mg total) by mouth daily. 90 tablet 4  . famotidine (PEPCID) 20 MG tablet One after supper (Patient taking differently: Take 20 mg by mouth daily.) 30 tablet 11  . fluticasone (FLONASE) 50 MCG/ACT nasal spray PLACE 1 SPRAY INTO BOTH NOSTRILS 2 (TWO) TIMES DAILY 48 mL 3  . furosemide (LASIX) 20 MG tablet TAKE 1 TABLET BY MOUTH EVERY DAY 90 tablet 0  . gabapentin (NEURONTIN) 300 MG capsule Take 2 capsules (600 mg total) by mouth in the morning AND 3 capsules (900 mg total) at bedtime. 450 capsule 4  . JARDIANCE 10 MG TABS tablet TAKE 1 TABLET BY MOUTH EVERY DAY BEFORE BREAKFAST 90 tablet 3  . LORazepam (ATIVAN) 0.5 MG tablet TAKE 1/2 TO 1 TABLET BY MOUTH TWICE A DAY AS NEEDED FOR ANXIETY 30 tablet 2  . metoprolol succinate (TOPROL-XL) 25 MG 24 hr tablet Take 1 tablet (25 mg total) by mouth daily. 90 tablet 3  . Multiple Vitamin (MULTIVITAMIN WITH MINERALS) TABS tablet Take 1 tablet by mouth daily.    . nitroGLYCERIN (NITROSTAT) 0.4 MG SL tablet Place 1 tablet (0.4 mg total) under the tongue every 5 (five) minutes as needed. 25 tablet 2  . pantoprazole (PROTONIX) 40 MG tablet TAKE 1 TABLET BY MOUTH 2 TIMES DAILY 30-60 MIN BEFORE FIRST MEAL OF THE DAY 180 tablet 3  . sacubitril-valsartan (ENTRESTO) 24-26 MG Take 1 tablet by mouth 2 (two) times daily. 180 tablet 3  . spironolactone (ALDACTONE) 25 MG tablet Take 1 tablet (25 mg total) by mouth daily. 90 tablet 3  . traMADol (ULTRAM) 50 MG tablet Take 1 tablet (50 mg total) by mouth 2 (two) times daily as needed for moderate pain. 20 tablet 2   No facility-administered medications prior to visit.     Per HPI unless specifically indicated in ROS section below Review of Systems  Objective:  BP 126/78   Pulse 80   Temp 98.8 F (37.1 C) (Oral)   Ht 5' (1.524 m)    Wt 269 lb (122 kg)   LMP 03/08/2013   SpO2 96% Comment: 2 L  BMI 52.54 kg/m   Wt Readings from Last 3 Encounters:  03/16/23 269 lb (122 kg)  03/02/23 258 lb 11.2 oz (117.3 kg)  01/28/23 263 lb (119.3 kg)      Physical Exam Vitals and nursing note reviewed.  Constitutional:      Appearance: Normal appearance. She is not ill-appearing.     Comments:  Ambulates unassisted  Supplemental O2 via [place]  HENT:     Mouth/Throat:     Mouth: Mucous membranes are moist.     Pharynx: Oropharynx is clear. No oropharyngeal exudate or posterior oropharyngeal erythema.  Cardiovascular:     Rate and Rhythm: Normal rate and regular rhythm.     Pulses: Normal pulses.     Heart sounds: Normal heart sounds. No murmur heard. Pulmonary:  Effort: Pulmonary effort is normal.     Breath sounds: Wheezing (upper airway diffusely) and rhonchi present. No rales.     Comments: Diminished breath sounds bliaterally Musculoskeletal:     Right lower leg: Edema (tr) present.     Left lower leg: Edema (tr) present.  Skin:    General: Skin is warm and dry.     Findings: No rash.  Neurological:     Mental Status: She is alert.  Psychiatric:        Mood and Affect: Mood normal.        Behavior: Behavior normal.       Lab Results  Component Value Date   NA 137 02/28/2023   CL 95 (L) 02/28/2023   K 4.1 02/28/2023   CO2 29 02/28/2023   BUN 36 (H) 02/28/2023   CREATININE 1.05 (H) 02/28/2023   GFRNONAA 59 (L) 02/28/2023   CALCIUM 8.9 02/28/2023   PHOS 3.8 10/20/2022   ALBUMIN 3.4 (L) 02/26/2023   GLUCOSE 183 (H) 02/28/2023    Lab Results  Component Value Date   WBC 19.3 (H) 02/28/2023   HGB 15.4 (H) 02/28/2023   HCT 49.8 (H) 02/28/2023   MCV 92.4 02/28/2023   PLT 277 02/28/2023     Assessment & Plan:   Problem List Items Addressed This Visit     COPD exacerbation (HCC)   Acute on chronic respiratory failure with hypoxia (HCC)   Relevant Orders   Basic metabolic panel   CBC with  Differential/Platelet   Other Visit Diagnoses     Encounter for immunization    -  Primary   Relevant Orders   Flu vaccine trivalent PF, 6mos and older(Flulaval,Afluria,Fluarix,Fluzone) (Completed)        No orders of the defined types were placed in this encounter.   Orders Placed This Encounter  Procedures  . Flu vaccine trivalent PF, 6mos and older(Flulaval,Afluria,Fluarix,Fluzone)  . Basic metabolic panel  . CBC with Differential/Platelet    Patient Instructions  Flu shot today Labs today  Increase lasix to 2 tablets daily in the morning for 3 days, update [place] with effect.  Schedule follow up visit with the lung doctors.  Keep follow up next month with me.   Follow up plan: No follow-ups on file.  [name], MD

## 2023-03-16 NOTE — Progress Notes (Unsigned)
Ph: [phone] Fax: [phone]   Patient ID: Brenda [name], female    DOB: [birth year], 62 y.o.   MRN: [id number]  This visit was conducted in person.  BP 126/78   Pulse 80   Temp 98.8 F (37.1 C) (Oral)   Ht 5' (1.524 m)   Wt 269 lb (122 kg)   LMP 03/08/2013   SpO2 96% Comment: 2 L  BMI 52.54 kg/m    CC: hosp f/u visit  Subjective:   HPI: Brenda [name] is a 62 y.o. female presenting on 03/16/2023 for Hospitalization Follow-up (Admitted on 02/24/23 at [facility], dx COPD exacerbation; peripheral edeama; acute bronchitis.)   O2 dependent asthmatic bronchitis/COPD on breztri 2 puffs BID and oxygen at 2L via [place] followed by pulm Dr Sherene Sires. In process of getting new CPAP for OSA - not currently using CPAP. Known cor pulmonale and CHF. She sleeps on adjustable bed.   Recent hospitalization for acute on chronic hypox respiratory failure caused by COPD exacerbation.  Hospital records reviewed. Med rec performed.  Oxygen increased to 4L on presentation, was able to weaned down to 2L prior to discharge. Treated with solumedrol, antibiotics and bronchodilators.  Discharged on oral prednisone. Completed azithromycin / rocephin course. Marland Kitchen  Also found to have acute on chronic CHF with preserved ejection fraction with 3+ pitting edema, treated with diuresis with net negative 9.9L.   Since home she is feeling better. No fevers/chills, no leg swelling, stable chronic cough and dyspnea and wheeze.  She continues Breztri 2 puffs BID - this is fully covered by her insurance.  She also has been using albuterol neb breathing treatments Q6 hours with PRN albuterol inhaler. Continues zyrtec D PRN.   Doesn't note significant difference/benefit when on steroids  Ex smoker - quit 09/2021.  Home health not set up.  Other follow up appointments scheduled: cardiology 04/06/2023 ______________________________________________________________________ Hospital admission: 02/24/2023 Hospital discharge:  03/02/2023 TCM f/u phone call: performed on 03/05/2023  Recommendations for Outpatient Follow-up:  Follow up with PCP in 1-2 weeks Please obtain BMP/CBC in one week Follow up with your cardiologist in 1 to 2 weeks.  Discharge Diagnoses:  Active Problems:   COPD with acute exacerbation (HCC)   Bipolar disorder (HCC)   Essential hypertension   Non-insulin dependent type 2 diabetes mellitus (HCC)   Hyperlipidemia   History of CVA (cerebrovascular accident)   Peripheral neuropathy   GERD (gastroesophageal reflux disease)   Acute on chronic respiratory failure with hypoxia (HCC)     Relevant past medical, surgical, family and social history reviewed and updated as indicated. Interim medical history since our last visit reviewed. Allergies and medications reviewed and updated. Outpatient Medications Prior to Visit  Medication Sig Dispense Refill   albuterol (PROVENTIL) (2.5 MG/3ML) 0.083% nebulizer solution INHALE 1 VIAL VIA NEBULIZER EVERY 6 HOURS AS NEEDED FOR WHEEZING/SHORTNESS OF BREATH 150 mL 1   albuterol (VENTOLIN HFA) 108 (90 Base) MCG/ACT inhaler TAKE 2 PUFFS BY MOUTH EVERY 6 HOURS AS NEEDED FOR WHEEZE OR SHORTNESS OF BREATH 8.5 each 3   ARIPiprazole (ABILIFY) 10 MG tablet Take 1 tablet (10 mg total) by mouth daily. 90 tablet 4   aspirin EC 81 MG EC tablet Take 1 tablet (81 mg total) by mouth daily. Swallow whole. 30 tablet 0   atorvastatin (LIPITOR) 80 MG tablet TAKE 1 TABLET BY MOUTH EVERYDAY AT BEDTIME For cholesterol 90 tablet 4   Budeson-Glycopyrrol-Formoterol (BREZTRI AEROSPHERE) 160-9-4.8 MCG/ACT AERO Inhale 2 puffs into the lungs in the  morning and at bedtime.     cetirizine-pseudoephedrine (ZYRTEC-D) 5-120 MG tablet Take 1 tablet by mouth every evening.     Cholecalciferol (VITAMIN D3) 25 MCG (1000 UT) CAPS Take 1 capsule (1,000 Units total) by mouth daily. 30 capsule    dextromethorphan-guaiFENesin (MUCINEX DM) 30-600 MG 12hr tablet Take 1 tablet by mouth 2 (two) times  daily as needed for cough.     Dulaglutide (TRULICITY) 0.75 MG/0.5ML SOPN Inject 0.75 mg into the skin once a week. 6 mL 4   escitalopram (LEXAPRO) 20 MG tablet Take 1 tablet (20 mg total) by mouth daily. 90 tablet 4   famotidine (PEPCID) 20 MG tablet One after supper (Patient taking differently: Take 20 mg by mouth daily.) 30 tablet 11   fluticasone (FLONASE) 50 MCG/ACT nasal spray PLACE 1 SPRAY INTO BOTH NOSTRILS 2 (TWO) TIMES DAILY 48 mL 3   furosemide (LASIX) 20 MG tablet TAKE 1 TABLET BY MOUTH EVERY DAY 90 tablet 0   gabapentin (NEURONTIN) 300 MG capsule Take 2 capsules (600 mg total) by mouth in the morning AND 3 capsules (900 mg total) at bedtime. 450 capsule 4   JARDIANCE 10 MG TABS tablet TAKE 1 TABLET BY MOUTH EVERY DAY BEFORE BREAKFAST 90 tablet 3   LORazepam (ATIVAN) 0.5 MG tablet TAKE 1/2 TO 1 TABLET BY MOUTH TWICE A DAY AS NEEDED FOR ANXIETY 30 tablet 2   metoprolol succinate (TOPROL-XL) 25 MG 24 hr tablet Take 1 tablet (25 mg total) by mouth daily. 90 tablet 3   Multiple Vitamin (MULTIVITAMIN WITH MINERALS) TABS tablet Take 1 tablet by mouth daily.     nitroGLYCERIN (NITROSTAT) 0.4 MG SL tablet Place 1 tablet (0.4 mg total) under the tongue every 5 (five) minutes as needed. 25 tablet 2   pantoprazole (PROTONIX) 40 MG tablet TAKE 1 TABLET BY MOUTH 2 TIMES DAILY 30-60 MIN BEFORE FIRST MEAL OF THE DAY 180 tablet 3   sacubitril-valsartan (ENTRESTO) 24-26 MG Take 1 tablet by mouth 2 (two) times daily. 180 tablet 3   spironolactone (ALDACTONE) 25 MG tablet Take 1 tablet (25 mg total) by mouth daily. 90 tablet 3   traMADol (ULTRAM) 50 MG tablet Take 1 tablet (50 mg total) by mouth 2 (two) times daily as needed for moderate pain. 20 tablet 2   No facility-administered medications prior to visit.     Per HPI unless specifically indicated in ROS section below Review of Systems  Objective:  BP 126/78   Pulse 80   Temp 98.8 F (37.1 C) (Oral)   Ht 5' (1.524 m)   Wt 269 lb (122 kg)    LMP 03/08/2013   SpO2 96% Comment: 2 L  BMI 52.54 kg/m   Wt Readings from Last 3 Encounters:  03/16/23 269 lb (122 kg)  03/02/23 258 lb 11.2 oz (117.3 kg)  01/28/23 263 lb (119.3 kg)      Physical Exam Vitals and nursing note reviewed.  Constitutional:      Appearance: Normal appearance. She is not ill-appearing.     Comments:  Ambulates unassisted  Supplemental O2 via [place]  HENT:     Mouth/Throat:     Mouth: Mucous membranes are moist.     Pharynx: Oropharynx is clear. No oropharyngeal exudate or posterior oropharyngeal erythema.  Cardiovascular:     Rate and Rhythm: Normal rate and regular rhythm.     Pulses: Normal pulses.     Heart sounds: Normal heart sounds. No murmur heard. Pulmonary:  Effort: Pulmonary effort is normal.     Breath sounds: Wheezing (upper airway diffusely) and rhonchi present. No rales.     Comments: Diminished breath sounds bliaterally Musculoskeletal:     Right lower leg: Edema (tr) present.     Left lower leg: Edema (tr) present.  Skin:    General: Skin is warm and dry.     Findings: No rash.  Neurological:     Mental Status: She is alert.  Psychiatric:        Mood and Affect: Mood normal.        Behavior: Behavior normal.       Lab Results  Component Value Date   NA 137 02/28/2023   CL 95 (L) 02/28/2023   K 4.1 02/28/2023   CO2 29 02/28/2023   BUN 36 (H) 02/28/2023   CREATININE 1.05 (H) 02/28/2023   GFRNONAA 59 (L) 02/28/2023   CALCIUM 8.9 02/28/2023   PHOS 3.8 10/20/2022   ALBUMIN 3.4 (L) 02/26/2023   GLUCOSE 183 (H) 02/28/2023    Lab Results  Component Value Date   WBC 19.3 (H) 02/28/2023   HGB 15.4 (H) 02/28/2023   HCT 49.8 (H) 02/28/2023   MCV 92.4 02/28/2023   PLT 277 02/28/2023    BNP 117 (01/2023)   Assessment & Plan:   Problem List Items Addressed This Visit     COPD (chronic obstructive pulmonary disease) (HCC)   Chronic systolic CHF (congestive heart failure) (HCC)    Was diuresed in the hospital.   Persistent exertional dyspnea - will increase lasix to 40mg  daily for 3 days then return to 20mg  daily.  Keep cardiology f/u scheduled next month.       COPD exacerbation (HCC) - Primary    Recent hospitalization, treated with prednisone course and rocephin/azithromycin antibiotic. Symptoms are improved however with persistent baseline dyspnea, wheeze. She continues Breztri 2 puffs BID with duoneb treatments. Encouraged pulm f/u.       Acute on chronic respiratory failure with hypoxia (HCC)   Relevant Orders   Basic metabolic panel   CBC with Differential/Platelet   Other Visit Diagnoses     Encounter for immunization       Relevant Orders   Flu vaccine trivalent PF, 6mos and older(Flulaval,Afluria,Fluarix,Fluzone) (Completed)        No orders of the defined types were placed in this encounter.   Orders Placed This Encounter  Procedures   Flu vaccine trivalent PF, 6mos and older(Flulaval,Afluria,Fluarix,Fluzone)   Basic metabolic panel   CBC with Differential/Platelet    Patient Instructions  Flu shot today Labs today  Increase lasix to 2 tablets daily in the morning for 3 days, update [place] with effect.  Schedule follow up visit with the lung doctors.  Keep follow up next month with me.   Follow up plan: No follow-ups on file.  [name], MD

## 2023-03-17 LAB — BASIC METABOLIC PANEL
BUN: 10 mg/dL (ref 6–23)
CO2: 33 meq/L — ABNORMAL HIGH (ref 19–32)
Calcium: 9.3 mg/dL (ref 8.4–10.5)
Chloride: 102 meq/L (ref 96–112)
Creatinine, Ser: 1.17 mg/dL (ref 0.40–1.20)
GFR: 49.26 mL/min — ABNORMAL LOW (ref >60.00)
Glucose, Bld: 99 mg/dL (ref 70–99)
Potassium: 4.4 meq/L (ref 3.5–5.1)
Sodium: 143 meq/L (ref 135–145)

## 2023-03-17 LAB — CBC WITH DIFFERENTIAL/PLATELET
Basophils Absolute: 0.1 10*3/uL (ref 0.0–0.1)
Basophils Relative: 0.6 % (ref 0.0–3.0)
Eosinophils Absolute: 0.5 10*3/uL (ref 0.0–0.7)
Eosinophils Relative: 3.6 % (ref 0.0–5.0)
HCT: 41.3 % (ref 36.0–46.0)
Hemoglobin: 12.8 g/dL (ref 12.0–15.0)
Lymphocytes Relative: 13.6 % (ref 12.0–46.0)
Lymphs Abs: 1.9 10*3/uL (ref 0.7–4.0)
MCHC: 30.9 g/dL (ref 30.0–36.0)
MCV: 92.2 fL (ref 78.0–100.0)
Monocytes Absolute: 0.7 10*3/uL (ref 0.1–1.0)
Monocytes Relative: 5.3 % (ref 3.0–12.0)
Neutro Abs: 10.7 10*3/uL — ABNORMAL HIGH (ref 1.4–7.7)
Neutrophils Relative %: 76.9 % (ref 43.0–77.0)
Platelets: 244 10*3/uL (ref 150.0–400.0)
RBC: 4.48 Mil/uL (ref 3.87–5.11)
RDW: 16.3 % — ABNORMAL HIGH (ref 11.5–15.5)
WBC: 13.9 10*3/uL — ABNORMAL HIGH (ref 4.0–10.5)

## 2023-03-17 NOTE — Assessment & Plan Note (Signed)
Was diuresed in the hospital.  Persistent exertional dyspnea - will increase lasix to 40mg  daily for 3 days then return to 20mg  daily.  Keep cardiology f/u scheduled next month.

## 2023-03-21 ENCOUNTER — Other Ambulatory Visit: Payer: Self-pay | Admitting: Family Medicine

## 2023-03-22 ENCOUNTER — Ambulatory Visit: Payer: 59

## 2023-03-22 DIAGNOSIS — G459 Transient cerebral ischemic attack, unspecified: Secondary | ICD-10-CM | POA: Diagnosis not present

## 2023-03-22 LAB — CUP PACEART REMOTE DEVICE CHECK
Date Time Interrogation Session: 20241020230440
Implantable Pulse Generator Implant Date: 20211102

## 2023-03-31 ENCOUNTER — Other Ambulatory Visit (HOSPITAL_COMMUNITY): Payer: Self-pay | Admitting: Interventional Radiology

## 2023-03-31 DIAGNOSIS — I771 Stricture of artery: Secondary | ICD-10-CM

## 2023-04-03 ENCOUNTER — Inpatient Hospital Stay (HOSPITAL_COMMUNITY)
Admission: EM | Admit: 2023-04-03 | Discharge: 2023-04-12 | DRG: 190 | Disposition: A | Discharge status: Home / Self Care | Payer: 59 | Attending: Internal Medicine | Admitting: Internal Medicine

## 2023-04-03 ENCOUNTER — Other Ambulatory Visit: Payer: Self-pay

## 2023-04-03 ENCOUNTER — Encounter (HOSPITAL_COMMUNITY): Payer: Self-pay

## 2023-04-03 ENCOUNTER — Emergency Department (HOSPITAL_COMMUNITY): Payer: 59

## 2023-04-03 DIAGNOSIS — E119 Type 2 diabetes mellitus without complications: Secondary | ICD-10-CM | POA: Diagnosis not present

## 2023-04-03 DIAGNOSIS — Z803 Family history of malignant neoplasm of breast: Secondary | ICD-10-CM

## 2023-04-03 DIAGNOSIS — Z825 Family history of asthma and other chronic lower respiratory diseases: Secondary | ICD-10-CM

## 2023-04-03 DIAGNOSIS — E781 Pure hyperglyceridemia: Secondary | ICD-10-CM | POA: Diagnosis present

## 2023-04-03 DIAGNOSIS — Z8673 Personal history of transient ischemic attack (TIA), and cerebral infarction without residual deficits: Secondary | ICD-10-CM

## 2023-04-03 DIAGNOSIS — E1142 Type 2 diabetes mellitus with diabetic polyneuropathy: Secondary | ICD-10-CM | POA: Diagnosis present

## 2023-04-03 DIAGNOSIS — Z9049 Acquired absence of other specified parts of digestive tract: Secondary | ICD-10-CM

## 2023-04-03 DIAGNOSIS — Z7985 Long-term (current) use of injectable non-insulin antidiabetic drugs: Secondary | ICD-10-CM

## 2023-04-03 DIAGNOSIS — K219 Gastro-esophageal reflux disease without esophagitis: Secondary | ICD-10-CM | POA: Diagnosis present

## 2023-04-03 DIAGNOSIS — J441 Chronic obstructive pulmonary disease with (acute) exacerbation: Principal | ICD-10-CM | POA: Diagnosis present

## 2023-04-03 DIAGNOSIS — E1165 Type 2 diabetes mellitus with hyperglycemia: Secondary | ICD-10-CM | POA: Diagnosis present

## 2023-04-03 DIAGNOSIS — I130 Hypertensive heart and chronic kidney disease with heart failure and stage 1 through stage 4 chronic kidney disease, or unspecified chronic kidney disease: Secondary | ICD-10-CM | POA: Diagnosis present

## 2023-04-03 DIAGNOSIS — D72829 Elevated white blood cell count, unspecified: Secondary | ICD-10-CM | POA: Diagnosis present

## 2023-04-03 DIAGNOSIS — Z87891 Personal history of nicotine dependence: Secondary | ICD-10-CM

## 2023-04-03 DIAGNOSIS — I10 Essential (primary) hypertension: Secondary | ICD-10-CM | POA: Diagnosis present

## 2023-04-03 DIAGNOSIS — R0602 Shortness of breath: Secondary | ICD-10-CM | POA: Diagnosis not present

## 2023-04-03 DIAGNOSIS — Z6841 Body Mass Index (BMI) 40.0 and over, adult: Secondary | ICD-10-CM

## 2023-04-03 DIAGNOSIS — R0902 Hypoxemia: Secondary | ICD-10-CM | POA: Diagnosis not present

## 2023-04-03 DIAGNOSIS — Z794 Long term (current) use of insulin: Secondary | ICD-10-CM

## 2023-04-03 DIAGNOSIS — E1122 Type 2 diabetes mellitus with diabetic chronic kidney disease: Secondary | ICD-10-CM | POA: Diagnosis not present

## 2023-04-03 DIAGNOSIS — Z1152 Encounter for screening for COVID-19: Secondary | ICD-10-CM | POA: Diagnosis not present

## 2023-04-03 DIAGNOSIS — I5042 Chronic combined systolic (congestive) and diastolic (congestive) heart failure: Secondary | ICD-10-CM | POA: Diagnosis not present

## 2023-04-03 DIAGNOSIS — M1712 Unilateral primary osteoarthritis, left knee: Secondary | ICD-10-CM | POA: Diagnosis not present

## 2023-04-03 DIAGNOSIS — Z83438 Family history of other disorder of lipoprotein metabolism and other lipidemia: Secondary | ICD-10-CM

## 2023-04-03 DIAGNOSIS — G4733 Obstructive sleep apnea (adult) (pediatric): Secondary | ICD-10-CM

## 2023-04-03 DIAGNOSIS — I5189 Other ill-defined heart diseases: Secondary | ICD-10-CM | POA: Diagnosis not present

## 2023-04-03 DIAGNOSIS — Z9981 Dependence on supplemental oxygen: Secondary | ICD-10-CM | POA: Diagnosis not present

## 2023-04-03 DIAGNOSIS — Z7952 Long term (current) use of systemic steroids: Secondary | ICD-10-CM

## 2023-04-03 DIAGNOSIS — I2510 Atherosclerotic heart disease of native coronary artery without angina pectoris: Secondary | ICD-10-CM | POA: Diagnosis present

## 2023-04-03 DIAGNOSIS — N1831 Chronic kidney disease, stage 3a: Secondary | ICD-10-CM | POA: Diagnosis not present

## 2023-04-03 DIAGNOSIS — Z8249 Family history of ischemic heart disease and other diseases of the circulatory system: Secondary | ICD-10-CM

## 2023-04-03 DIAGNOSIS — I252 Old myocardial infarction: Secondary | ICD-10-CM | POA: Diagnosis not present

## 2023-04-03 DIAGNOSIS — Z7984 Long term (current) use of oral hypoglycemic drugs: Secondary | ICD-10-CM | POA: Diagnosis not present

## 2023-04-03 DIAGNOSIS — F411 Generalized anxiety disorder: Secondary | ICD-10-CM | POA: Diagnosis present

## 2023-04-03 DIAGNOSIS — J9811 Atelectasis: Secondary | ICD-10-CM | POA: Diagnosis not present

## 2023-04-03 DIAGNOSIS — Z7982 Long term (current) use of aspirin: Secondary | ICD-10-CM

## 2023-04-03 DIAGNOSIS — E1169 Type 2 diabetes mellitus with other specified complication: Secondary | ICD-10-CM

## 2023-04-03 DIAGNOSIS — Z79899 Other long term (current) drug therapy: Secondary | ICD-10-CM

## 2023-04-03 DIAGNOSIS — Z955 Presence of coronary angioplasty implant and graft: Secondary | ICD-10-CM

## 2023-04-03 DIAGNOSIS — J9621 Acute and chronic respiratory failure with hypoxia: Secondary | ICD-10-CM | POA: Diagnosis present

## 2023-04-03 DIAGNOSIS — J9611 Chronic respiratory failure with hypoxia: Secondary | ICD-10-CM | POA: Diagnosis present

## 2023-04-03 DIAGNOSIS — F319 Bipolar disorder, unspecified: Secondary | ICD-10-CM | POA: Diagnosis present

## 2023-04-03 DIAGNOSIS — R2241 Localized swelling, mass and lump, right lower limb: Secondary | ICD-10-CM | POA: Diagnosis present

## 2023-04-03 LAB — BASIC METABOLIC PANEL
Anion gap: 9 (ref 5–15)
BUN: 14 mg/dL (ref 8–23)
CO2: 29 mmol/L (ref 22–32)
Calcium: 8.6 mg/dL — ABNORMAL LOW (ref 8.9–10.3)
Chloride: 102 mmol/L (ref 98–111)
Creatinine, Ser: 1.14 mg/dL — ABNORMAL HIGH (ref 0.44–1.00)
GFR, Estimated: 54 mL/min — ABNORMAL LOW (ref >60)
Glucose, Bld: 132 mg/dL — ABNORMAL HIGH (ref 70–99)
Potassium: 3.7 mmol/L (ref 3.5–5.1)
Sodium: 140 mmol/L (ref 135–145)

## 2023-04-03 LAB — CBC
HCT: 43.5 % (ref 36.0–46.0)
Hemoglobin: 13.5 g/dL (ref 12.0–15.0)
MCH: 29.7 pg (ref 26.0–34.0)
MCHC: 31 g/dL (ref 30.0–36.0)
MCV: 95.8 fL (ref 80.0–100.0)
Platelets: 275 10*3/uL (ref 150–400)
RBC: 4.54 MIL/uL (ref 3.87–5.11)
RDW: 16.2 % — ABNORMAL HIGH (ref 11.5–15.5)
WBC: 15.3 10*3/uL — ABNORMAL HIGH (ref 4.0–10.5)
nRBC: 0 % (ref 0.0–0.2)

## 2023-04-03 MED ORDER — PANTOPRAZOLE SODIUM 40 MG PO TBEC
40.0000 mg | DELAYED_RELEASE_TABLET | Freq: Every day | ORAL | Status: DC
  Administered 2023-04-04 – 2023-04-11 (×9): 40 mg via ORAL
  Filled 2023-04-03 (×9): qty 1

## 2023-04-03 MED ORDER — METHYLPREDNISOLONE SODIUM SUCC 125 MG IJ SOLR
125.0000 mg | Freq: Once | INTRAMUSCULAR | Status: AC
  Administered 2023-04-03: 125 mg via INTRAMUSCULAR
  Filled 2023-04-03: qty 2

## 2023-04-03 MED ORDER — METHYLPREDNISOLONE SODIUM SUCC 40 MG IJ SOLR
40.0000 mg | Freq: Every day | INTRAMUSCULAR | Status: DC
  Administered 2023-04-04 – 2023-04-07 (×4): 40 mg via INTRAVENOUS
  Filled 2023-04-03 (×4): qty 1

## 2023-04-03 MED ORDER — ADULT MULTIVITAMIN W/MINERALS CH
1.0000 | ORAL_TABLET | Freq: Every day | ORAL | Status: DC
  Administered 2023-04-04 – 2023-04-12 (×9): 1 via ORAL
  Filled 2023-04-03 (×9): qty 1

## 2023-04-03 MED ORDER — ALBUTEROL SULFATE (2.5 MG/3ML) 0.083% IN NEBU
2.5000 mg | INHALATION_SOLUTION | Freq: Once | RESPIRATORY_TRACT | Status: AC
  Administered 2023-04-03: 2.5 mg via RESPIRATORY_TRACT
  Filled 2023-04-03: qty 3

## 2023-04-03 MED ORDER — ACETAMINOPHEN 325 MG PO TABS
650.0000 mg | ORAL_TABLET | Freq: Four times a day (QID) | ORAL | Status: DC | PRN
  Administered 2023-04-06: 650 mg via ORAL
  Filled 2023-04-03: qty 2

## 2023-04-03 MED ORDER — POLYETHYLENE GLYCOL 3350 17 G PO PACK: 17.0000 g | PACK | Freq: Every day | ORAL | Status: DC | PRN

## 2023-04-03 MED ORDER — IPRATROPIUM-ALBUTEROL 0.5-2.5 (3) MG/3ML IN SOLN
3.0000 mL | Freq: Four times a day (QID) | RESPIRATORY_TRACT | Status: DC
  Administered 2023-04-03 – 2023-04-04 (×4): 3 mL via RESPIRATORY_TRACT
  Filled 2023-04-03 (×3): qty 3

## 2023-04-03 MED ORDER — GABAPENTIN 300 MG PO CAPS
300.0000 mg | ORAL_CAPSULE | Freq: Every day | ORAL | Status: DC
  Administered 2023-04-04 – 2023-04-11 (×8): 300 mg via ORAL
  Filled 2023-04-03 (×8): qty 1

## 2023-04-03 MED ORDER — UMECLIDINIUM BROMIDE 62.5 MCG/ACT IN AEPB
1.0000 | INHALATION_SPRAY | Freq: Every day | RESPIRATORY_TRACT | Status: DC
  Administered 2023-04-04 – 2023-04-12 (×9): 1 via RESPIRATORY_TRACT
  Filled 2023-04-03: qty 7

## 2023-04-03 MED ORDER — PROCHLORPERAZINE EDISYLATE 10 MG/2ML IJ SOLN: 5.0000 mg | Freq: Four times a day (QID) | INTRAMUSCULAR | Status: DC | PRN

## 2023-04-03 MED ORDER — MOMETASONE FURO-FORMOTEROL FUM 100-5 MCG/ACT IN AERO
2.0000 | INHALATION_SPRAY | Freq: Two times a day (BID) | RESPIRATORY_TRACT | Status: DC
  Administered 2023-04-04 – 2023-04-07 (×7): 2 via RESPIRATORY_TRACT
  Filled 2023-04-03: qty 8.8

## 2023-04-03 MED ORDER — FAMOTIDINE 20 MG PO TABS
20.0000 mg | ORAL_TABLET | Freq: Every day | ORAL | Status: DC
  Administered 2023-04-04 – 2023-04-12 (×9): 20 mg via ORAL
  Filled 2023-04-03 (×9): qty 1

## 2023-04-03 MED ORDER — ESCITALOPRAM OXALATE 20 MG PO TABS
20.0000 mg | ORAL_TABLET | Freq: Every day | ORAL | Status: DC
  Administered 2023-04-04 – 2023-04-12 (×9): 20 mg via ORAL
  Filled 2023-04-03 (×5): qty 1
  Filled 2023-04-03: qty 2
  Filled 2023-04-03 (×3): qty 1

## 2023-04-03 MED ORDER — ARIPIPRAZOLE 5 MG PO TABS
10.0000 mg | ORAL_TABLET | Freq: Every day | ORAL | Status: DC
  Administered 2023-04-04 – 2023-04-12 (×9): 10 mg via ORAL
  Filled 2023-04-03: qty 1
  Filled 2023-04-03: qty 2
  Filled 2023-04-03: qty 1
  Filled 2023-04-03 (×4): qty 2
  Filled 2023-04-03: qty 1
  Filled 2023-04-03: qty 2

## 2023-04-03 MED ORDER — IPRATROPIUM-ALBUTEROL 0.5-2.5 (3) MG/3ML IN SOLN
3.0000 mL | Freq: Once | RESPIRATORY_TRACT | Status: AC
  Administered 2023-04-03: 3 mL via RESPIRATORY_TRACT
  Filled 2023-04-03: qty 3

## 2023-04-03 MED ORDER — ENOXAPARIN SODIUM 40 MG/0.4ML IJ SOSY
40.0000 mg | PREFILLED_SYRINGE | INTRAMUSCULAR | Status: DC
  Administered 2023-04-04 – 2023-04-12 (×9): 40 mg via SUBCUTANEOUS
  Filled 2023-04-03 (×9): qty 0.4

## 2023-04-03 MED ORDER — GABAPENTIN 300 MG PO CAPS
300.0000 mg | ORAL_CAPSULE | Freq: Every day | ORAL | Status: DC
  Administered 2023-04-04 – 2023-04-11 (×8): 300 mg via ORAL
  Filled 2023-04-03 (×9): qty 1

## 2023-04-03 MED ORDER — MELATONIN 5 MG PO TABS
5.0000 mg | ORAL_TABLET | Freq: Every evening | ORAL | Status: DC | PRN
  Filled 2023-04-03: qty 1

## 2023-04-03 MED ORDER — DEXTROSE 5 % IV SOLN
500.0000 mg | INTRAVENOUS | Status: DC
  Administered 2023-04-04: 500 mg via INTRAVENOUS
  Filled 2023-04-03: qty 5

## 2023-04-03 MED ORDER — SPIRONOLACTONE 25 MG PO TABS
25.0000 mg | ORAL_TABLET | Freq: Every day | ORAL | Status: DC
  Administered 2023-04-04 – 2023-04-12 (×9): 25 mg via ORAL
  Filled 2023-04-03 (×9): qty 1

## 2023-04-03 MED ORDER — ASPIRIN 81 MG PO TBEC
81.0000 mg | DELAYED_RELEASE_TABLET | Freq: Every day | ORAL | Status: DC
  Administered 2023-04-04 – 2023-04-12 (×9): 81 mg via ORAL
  Filled 2023-04-03 (×9): qty 1

## 2023-04-03 MED ORDER — ATORVASTATIN CALCIUM 40 MG PO TABS
80.0000 mg | ORAL_TABLET | Freq: Every day | ORAL | Status: DC
  Administered 2023-04-04 – 2023-04-12 (×10): 80 mg via ORAL
  Filled 2023-04-03 (×10): qty 2

## 2023-04-03 MED ORDER — BUDESON-GLYCOPYRROL-FORMOTEROL 160-9-4.8 MCG/ACT IN AERO: 2.0000 | INHALATION_SPRAY | Freq: Two times a day (BID) | RESPIRATORY_TRACT | Status: DC

## 2023-04-03 MED ORDER — MAGNESIUM SULFATE 2 GM/50ML IV SOLN
2.0000 g | Freq: Once | INTRAVENOUS | Status: AC
  Administered 2023-04-03: 2 g via INTRAVENOUS
  Filled 2023-04-03: qty 50

## 2023-04-03 MED ORDER — SACUBITRIL-VALSARTAN 24-26 MG PO TABS
1.0000 | ORAL_TABLET | Freq: Two times a day (BID) | ORAL | Status: DC
  Administered 2023-04-04 – 2023-04-12 (×18): 1 via ORAL
  Filled 2023-04-03 (×19): qty 1

## 2023-04-03 MED ORDER — ALBUTEROL SULFATE HFA 108 (90 BASE) MCG/ACT IN AERS
2.0000 | INHALATION_SPRAY | RESPIRATORY_TRACT | Status: DC | PRN
  Filled 2023-04-03: qty 6.7

## 2023-04-03 NOTE — ED Triage Notes (Signed)
Pt to ED by POV from home with c/o COPD exacerbation. Pt states this began flaring up yesterday and has been getting progressively worse. Pt arrives tachypnic, hypertensive and anxious throughout triage process. Pt also endorses worsening of SOB on any movement.

## 2023-04-03 NOTE — H&P (Incomplete)
History and Physical  MALEEA CAMILO [name]:[id number] DOB: [birth year] DOA: 04/03/2023  Referring physician: Dr. Estell Harpin, EDP  PCP: [name], MD  Outpatient Specialists: Cardiology, neurology. Patient coming from: Home.  Chief Complaint: Cough, shortness of breath, wheezing  HPI: Brenda [name] is a 62 y.o. female with medical history significant for coronary artery disease, chronic HFpEF, CKD 3A, COPD, chronic hypoxic respiratory failure dependent on 2 L nasal cannula, essential hypertension, type 2 diabetes, diabetic polyneuropathy, hyperlipidemia, GERD, severe morbid obesity, bipolar disorder/generalized anxiety disorder, who presented to the ED from home due to worsening dyspnea with minimal exertion, coughing and wheezing.  No reported subjective fevers or chills.  In the ED, hypoxic with O2 requirement above baseline of 2 L.  Chest x-ray is nonacute.  Audible wheezing noted.  The patient received bronchodilators, IV magnesium, and IV steroids.  TRH, hospitalist service, was asked to admit.  ED Course: Temperature 98.6.  BP 156/93, pulse 87, respiratory 27, saturation 95% on 4 L.  WBC 15.3, serum glucose 132, creatinine 1.14, GFR 54.  Review of Systems: Review of systems as noted in the HPI. All other systems reviewed and are negative.   Past Medical History:  Diagnosis Date  . Acute pancreatitis   . Asthma    main dyspnea thought due to deconditioning (10/2013)  . Bipolar I disorder, most recent episode (or current) manic, unspecified    pt denies this  . Bronchitis, chronic obstructive (HCC) 2008 & 2009   FeV1 64% TLC 105% DLCO 55% 2008  -  FeV1 81% FeF 25-75 43% 2009  . CAD (coronary artery disease)    inferior STEMI in 10/2020 s/p DES to RCA  . Chronic combined systolic and diastolic CHF (congestive heart failure) (HCC)    LVEF 30-35% in 01/2020 but improved to 60-65% in 05/2021  . COPD (chronic obstructive pulmonary disease) (HCC)    emphysema, not an issue per pulm  (10/2013)  . History of pyelonephritis 05/2012  . History of ST elevation myocardial infarction (STEMI)   . HTN (hypertension)   . Hx of migraines   . Hyperlipidemia   . Knee pain, right   . Lumbar disc disease with radiculopathy    multilevel spondylitic changes, scoliosis, and anterolisthesis L4 not thought to currently be surg candidate (Dr. Phoebe Perch, [facility]) (Dr. Ollen Bowl, [place])  . Nicotine addiction   . Obesity   . RSV (respiratory syncytial virus pneumonia) 01/2020   hospitalization  . Suicide attempt (HCC) 06/2001  . Swelling of limb   . TIA (transient ischemic attack)   . Type 2 diabetes mellitus (HCC)   . Urge and stress incontinence 07/2012   ([place])   Past Surgical History:  Procedure Laterality Date  . BUBBLE STUDY  04/02/2020   Procedure: BUBBLE STUDY;  Surgeon: Vesta Mixer, MD;  Location: [facility] ENDOSCOPY;  Service: Cardiovascular;;  . CHOLECYSTECTOMY  07/1982  . CORONARY/GRAFT ACUTE MI REVASCULARIZATION N/A 11/02/2020   Procedure: Coronary/Graft Acute MI Revascularization;  Surgeon: Yvonne Kendall, MD;  Location: [facility] INVASIVE CV LAB;  Service: Cardiovascular;  Laterality: N/A;  . CT MAXILLOFACIAL WO/W CM  06/14/06   Left max mucocele  . CT of chest  06/14/2006   Normal except c/w active inflammation / infection.  Left renal atrophy  . ERCP / sphincterotomy - stenosis  01/15/06  . IR ANGIO INTRA EXTRACRAN SEL COM CAROTID INNOMINATE BILAT MOD SED  02/09/2020  . IR ANGIO VERTEBRAL SEL SUBCLAVIAN INNOMINATE UNI L MOD SED  02/09/2020  . IR ANGIO  VERTEBRAL SEL VERTEBRAL UNI R MOD SED  02/09/2020  . IR ANGIO VERTEBRAL SEL VERTEBRAL UNI R MOD SED  02/13/2020  . IR US GUIDE VASC ACCESS RIGHT  02/09/2020  . IR US GUIDE VASC ACCESS RIGHT  02/13/2020  . LEFT HEART CATH AND CORONARY ANGIOGRAPHY N/A 11/02/2020   Procedure: LEFT HEART CATH AND CORONARY ANGIOGRAPHY;  Surgeon: Yvonne Kendall, MD;  Location: [facility] INVASIVE CV LAB;  Service: Cardiovascular;  Laterality: N/A;  . LOOP  RECORDER INSERTION N/A 04/02/2020   Procedure: LOOP RECORDER INSERTION;  Surgeon: Marinus Maw, MD;  Location: [facility] INVASIVE CV LAB;  Service: Cardiovascular;  Laterality: N/A;  . lumbar MRI  11/2010   multilevel spondylitic changes with upper lumbar scoliosis and anterolisthesis of L4 on 5 with some L foraminal narrowing esp at L/3 with concavity of scoliosis, some central stenosis and biforaminal narrowing at L4/5 with spondylolisthesis  . [place] - Pneumonia, acute asthma, left maxillary sinusitis  01/11 - 06/18/2006  . RADIOLOGY WITH ANESTHESIA N/A 02/13/2020   Procedure: Carotid Stent;  Surgeon: Julieanne Cotton, MD;  Location: [facility] OR;  Service: Radiology;  Laterality: N/A;  . Retained stone     ERCP secondary to retained stone  . Right knee surgery  1984,1989,1989,1991,1994   Reconstructions for ACL insuff  . TEE WITHOUT CARDIOVERSION N/A 04/02/2020   Procedure: TRANSESOPHAGEAL ECHOCARDIOGRAM (TEE);  Surgeon: Elease Hashimoto Deloris Ping, MD;  Location: [facility] ENDOSCOPY;  Service: Cardiovascular;  Laterality: N/A;  . TUBAL LIGATION      Social History:  reports that she quit smoking about 18 months ago. Her smoking use included cigarettes. She started smoking about 42 years ago. She has a 10.3 pack-year smoking history. She has been exposed to tobacco smoke. She has never used smokeless tobacco. She reports current alcohol use. She reports that she does not currently use drugs after having used the following drugs: Marijuana.   Allergies  Allergen Reactions  . Metolazone Other (See Comments)    Metabolic mood swings    Family History  Problem Relation Age of Onset  . Heart disease Brother        MI in early 22's  . Hypertension Mother   . Hyperlipidemia Mother   . Heart disease Mother   . Hypertension Father   . Asthma Father   . Breast cancer Paternal Grandmother   . Breast cancer Paternal Aunt       Prior to Admission medications   Medication Sig Start Date End Date Taking?  Authorizing Provider  albuterol (PROVENTIL) (2.5 MG/3ML) 0.083% nebulizer solution INHALE 1 VIAL VIA NEBULIZER EVERY 6 HOURS AS NEEDED FOR WHEEZING/SHORTNESS OF BREATH 03/22/23  Yes [name], MD  albuterol (VENTOLIN HFA) 108 (90 Base) MCG/ACT inhaler TAKE 2 PUFFS BY MOUTH EVERY 6 HOURS AS NEEDED FOR WHEEZE OR SHORTNESS OF BREATH 01/19/23  Yes [name], MD  ARIPiprazole (ABILIFY) 10 MG tablet Take 1 tablet (10 mg total) by mouth daily. 10/27/22  Yes [name], MD  aspirin EC 81 MG EC tablet Take 1 tablet (81 mg total) by mouth daily. Swallow whole. 02/05/20  Yes Narda Bonds, MD  atorvastatin (LIPITOR) 80 MG tablet TAKE 1 TABLET BY MOUTH EVERYDAY AT BEDTIME For cholesterol 10/27/22  Yes [name], MD  Budeson-Glycopyrrol-Formoterol (BREZTRI AEROSPHERE) 160-9-4.8 MCG/ACT AERO Inhale 2 puffs into the lungs in the morning and at bedtime. 12/11/22  Yes Nyoka Cowden, MD  cetirizine-pseudoephedrine (ZYRTEC-D) 5-120 MG tablet Take 1 tablet by mouth every evening.   Yes [provider]  Cholecalciferol (VITAMIN D3) 25 MCG (1000 UT) CAPS Take 1 capsule (1,000 Units total) by mouth daily. 10/01/20  Yes [name], MD  dextromethorphan-guaiFENesin [facility] DM) 30-600 MG 12hr tablet Take 1 tablet by mouth 2 (two) times daily as needed for cough.   Yes [provider]  Dulaglutide (TRULICITY) 0.75 MG/0.5ML SOPN Inject 0.75 mg into the skin once a week. 10/27/22  Yes [name], MD  escitalopram (LEXAPRO) 20 MG tablet Take 1 tablet (20 mg total) by mouth daily. 10/27/22  Yes [name], MD  famotidine (PEPCID) 20 MG tablet One after supper Patient taking differently: Take 20 mg by mouth daily. 12/11/22  Yes Nyoka Cowden, MD  fluticasone (FLONASE) 50 MCG/ACT nasal spray PLACE 1 SPRAY INTO BOTH NOSTRILS 2 (TWO) TIMES DAILY 04/27/22  Yes [name], MD  furosemide (LASIX) 20 MG tablet TAKE 1 TABLET BY MOUTH EVERY DAY 01/19/23  Yes [name], MD  gabapentin (NEURONTIN) 300 MG capsule Take 2 capsules (600 mg total) by mouth in the morning AND 3 capsules (900 mg total) at bedtime. 10/27/22  Yes [name], MD  JARDIANCE 10 MG TABS tablet TAKE 1 TABLET BY MOUTH EVERY DAY BEFORE BREAKFAST Patient taking differently: Take 10 mg by mouth daily. 06/17/22  Yes O'Neal, Ronnald Ramp, MD  LORazepam (ATIVAN) 0.5 MG tablet TAKE 1/2 TO 1 TABLET BY MOUTH TWICE A DAY AS NEEDED FOR ANXIETY 12/11/22  Yes [name], MD  metoprolol succinate (TOPROL-XL) 25 MG 24 hr tablet Take 1 tablet (25 mg total) by mouth daily. 12/09/22  Yes O'Neal, Ronnald Ramp, MD  Multiple Vitamin (MULTIVITAMIN WITH MINERALS) TABS tablet Take 1 tablet by mouth daily.   Yes [provider]  pantoprazole (PROTONIX) 40 MG tablet TAKE 1 TABLET BY MOUTH 2 TIMES DAILY 30-60 MIN BEFORE FIRST MEAL OF THE DAY Patient taking differently: Take 40 mg by mouth at bedtime. 01/20/23  Yes Glenford Bayley, NP  sacubitril-valsartan (ENTRESTO) 24-26 MG Take 1 tablet by mouth 2 (two) times daily. 12/09/22  Yes O'Neal, Ronnald Ramp, MD  spironolactone (ALDACTONE) 25 MG tablet Take 1 tablet (25 mg total) by mouth daily. 12/09/22  Yes O'Neal, Ronnald Ramp, MD  traMADol (ULTRAM) 50 MG tablet Take 1 tablet (50 mg total) by mouth 2 (two) times daily as needed for moderate pain. 12/11/22  Yes [name], MD  nitroGLYCERIN (NITROSTAT) 0.4 MG SL tablet Place 1 tablet (0.4 mg total) under the tongue every 5 (five) minutes as needed. 11/04/20   Arty Baumgartner, NP    Physical Exam: BP 136/71   Pulse 84   Temp 98.6 F (37 C) (Oral)   Resp 16   LMP 03/08/2013   SpO2 95%   General: 62 y.o. year-old female well developed well nourished in no acute distress.  Alert and oriented x3. Cardiovascular: Regular rate and rhythm with no rubs or gallops.  No thyromegaly or JVD noted.  No lower extremity edema. 2/4 pulses in all 4 extremities. Respiratory: Diffuse wheezing  bilaterally.  Poor inspiratory effort. Abdomen: Soft nontender nondistended with normal bowel sounds x4 quadrants. Muskuloskeletal: No cyanosis, clubbing or edema noted bilaterally Neuro: CN II-XII intact, strength, sensation, reflexes Skin: No ulcerative lesions noted or rashes Psychiatry: Judgement and insight appear normal. Mood is appropriate for condition and setting          Labs on Admission:  Basic Metabolic Panel: Recent Labs  Lab 04/03/23 2135  NA 140  K 3.7  CL 102  CO2 29  GLUCOSE 132*  BUN 14  CREATININE 1.14*  CALCIUM 8.6*   Liver Function Tests: No results for input(s): "AST", "ALT", "ALKPHOS", "BILITOT", "PROT", "ALBUMIN" in the last 168 hours. No results for input(s): "LIPASE", "AMYLASE" in the last 168 hours. No results for input(s): "AMMONIA" in the last 168 hours. CBC: Recent Labs  Lab 04/03/23 2046  WBC 15.3*  HGB 13.5  HCT 43.5  MCV 95.8  PLT 275   Cardiac Enzymes: No results for input(s): "CKTOTAL", "CKMB", "CKMBINDEX", "TROPONINI" in the last 168 hours.  BNP (last 3 results) Recent Labs    02/24/23 2220  BNP 117.5*    ProBNP (last 3 results) Recent Labs    10/14/22 1619 01/14/23 1036  PROBNP 19.0 34.0    CBG: No results for input(s): "GLUCAP" in the last 168 hours.  Radiological Exams on Admission: DG Chest Port 1 View  Result Date: 04/03/2023 CLINICAL DATA:  Shortness of breath EXAM: PORTABLE CHEST 1 VIEW COMPARISON:  02/24/2023 FINDINGS: Loop recorder device remains in place, unchanged. Heart and mediastinal contours are within normal limits. No focal opacities or effusions. No acute bony abnormality. IMPRESSION: No active disease. Electronically Signed   By: Charlett Nose M.D.   On: 04/03/2023 22:05    EKG: I independently viewed the EKG done and my findings are as followed: Sinus rhythm rate of 86.  Nonspecific ST-T changes.  QTc 438.  Assessment/Plan Present on Admission: . COPD with acute exacerbation (HCC)  Principal  Problem:   COPD with acute exacerbation (HCC)  COPD with acute exacerbation, unclear trigger Quit smoking many years ago Continue bronchodilators, IV steroids, IV azithromycin, early mobilization Continue monitoring  Acute on chronic hypoxic respiratory failure At baseline on 2 L nasal cannula Currently requiring 4 L to maintain saturation above 90% Continue to treat underlying conditions Home O2 evaluation on 04/05/2023 with ambulation.  Type 2 diabetes with hyperglycemia Last hemoglobin A1c 6.4 on 10/20/2022 Not on insulin prior to admission Monitor with CBG twice daily  HFpEF Resume home regimen Monitor strict I's and O's and daily weight  Hyperlipidemia Resume home regimen  Hypertension Resume home regimen  GERD Resume home regimen  Bipolar disorder/chronic anxiety Resume home regimen  CKD 3A Currently at baseline Avoid nephrotoxic agents and hypotension Monitor urine output Repeat renal panel in the morning  Obesity Weight 122 kg Recommend weight observation with regular physical activity and healthy dieting.  Physical debility PT OT assessment Fall precautions.   Time: 75 minutes.   DVT prophylaxis: Subcu Lovenox daily  Code Status: ***   Family Communication: ***   Disposition Plan: ***   Consults called: ***   Admission status: ***    Status is: Inpatient {Inpatient:23812}   Darlin Drop MD [facility] Pager [phone]  If 7PM-7AM, please contact night-coverage [email or web address] Password [email or web address]  04/03/2023, 11:40 PM

## 2023-04-03 NOTE — H&P (Incomplete)
History and Physical  Brenda [name]:[id number] DOB: 1960-07-17 DOA: 04/03/2023  Referring physician: Dr. Estell Harpin, EDP  PCP: [name], MD  Outpatient Specialists: Cardiology, pulmonary. Patient coming from: Home.  Chief Complaint: Cough, shortness of breath, wheezing  HPI: Brenda [name] is a 62 y.o. female with medical history significant for coronary artery disease, chronic HFpEF, CKD 3A, COPD, former smoker, chronic hypoxic respiratory failure on 2 L nasal cannula, essential hypertension, type 2 diabetes, diabetic polyneuropathy, hyperlipidemia, GERD, severe morbid obesity, bipolar disorder/generalized anxiety disorder, who presented to the ED from home due to worsening dyspnea with minimal exertion, coughing, and wheezing for the past 2 days.  No reported subjective fevers or chills.  In the ED, hypoxic with O2 requirement above baseline of 2 L.  Chest x-ray was nonacute.  Audible wheezing noted.  The patient received bronchodilators, IV magnesium, and IV steroids.  TRH, hospitalist service, was asked to admit.  ED Course: Temperature 98.6.  BP 156/93, pulse 87, respiratory 27, saturation 95% on 4 L.  WBC 15.3, serum glucose 132, creatinine 1.14, GFR 54.  Review of Systems: Review of systems as noted in the HPI. All other systems reviewed and are negative.   Past Medical History:  Diagnosis Date   Acute pancreatitis    Asthma    main dyspnea thought due to deconditioning (10/2013)   Bipolar I disorder, most recent episode (or current) manic, unspecified    pt denies this   Bronchitis, chronic obstructive (HCC) 2008 & 2009   FeV1 64% TLC 105% DLCO 55% 2008  -  FeV1 81% FeF 25-75 43% 2009   CAD (coronary artery disease)    inferior STEMI in 10/2020 s/p DES to RCA   Chronic combined systolic and diastolic CHF (congestive heart failure) (HCC)    LVEF 30-35% in 01/2020 but improved to 60-65% in 05/2021   COPD (chronic obstructive pulmonary disease) (HCC)    emphysema, not an  issue per pulm (10/2013)   History of pyelonephritis 05/2012   History of ST elevation myocardial infarction (STEMI)    HTN (hypertension)    Hx of migraines    Hyperlipidemia    Knee pain, right    Lumbar disc disease with radiculopathy    multilevel spondylitic changes, scoliosis, and anterolisthesis L4 not thought to currently be surg candidate (Dr. Phoebe Perch, [facility]) (Dr. Ollen Bowl, [place])   Nicotine addiction    Obesity    RSV (respiratory syncytial virus pneumonia) 01/2020   hospitalization   Suicide attempt (HCC) 06/2001   Swelling of limb    TIA (transient ischemic attack)    Type 2 diabetes mellitus (HCC)    Urge and stress incontinence 07/2012   ([facility])   Past Surgical History:  Procedure Laterality Date   BUBBLE STUDY  04/02/2020   Procedure: BUBBLE STUDY;  Surgeon: Vesta Mixer, MD;  Location: [facility] ENDOSCOPY;  Service: Cardiovascular;;   CHOLECYSTECTOMY  07/1982   CORONARY/GRAFT ACUTE MI REVASCULARIZATION N/A 11/02/2020   Procedure: Coronary/Graft Acute MI Revascularization;  Surgeon: Yvonne Kendall, MD;  Location: [facility] INVASIVE CV LAB;  Service: Cardiovascular;  Laterality: N/A;   CT MAXILLOFACIAL WO/W CM  06/14/06   Left max mucocele   CT of chest  06/14/2006   Normal except c/w active inflammation / infection.  Left renal atrophy   ERCP / sphincterotomy - stenosis  01/15/06   IR ANGIO INTRA EXTRACRAN SEL COM CAROTID INNOMINATE BILAT MOD SED  02/09/2020   IR ANGIO VERTEBRAL SEL SUBCLAVIAN INNOMINATE UNI L MOD SED  02/09/2020   IR ANGIO VERTEBRAL SEL VERTEBRAL UNI R MOD SED  02/09/2020   IR ANGIO VERTEBRAL SEL VERTEBRAL UNI R MOD SED  02/13/2020   IR US GUIDE VASC ACCESS RIGHT  02/09/2020   IR US GUIDE VASC ACCESS RIGHT  02/13/2020   LEFT HEART CATH AND CORONARY ANGIOGRAPHY N/A 11/02/2020   Procedure: LEFT HEART CATH AND CORONARY ANGIOGRAPHY;  Surgeon: Yvonne Kendall, MD;  Location: [facility] INVASIVE CV LAB;  Service: Cardiovascular;  Laterality: N/A;   LOOP RECORDER INSERTION  N/A 04/02/2020   Procedure: LOOP RECORDER INSERTION;  Surgeon: Marinus Maw, MD;  Location: [facility] INVASIVE CV LAB;  Service: Cardiovascular;  Laterality: N/A;   lumbar MRI  11/2010   multilevel spondylitic changes with upper lumbar scoliosis and anterolisthesis of L4 on 5 with some L foraminal narrowing esp at L/3 with concavity of scoliosis, some central stenosis and biforaminal narrowing at L4/5 with spondylolisthesis   [place] - Pneumonia, acute asthma, left maxillary sinusitis  01/11 - 06/18/2006   RADIOLOGY WITH ANESTHESIA N/A 02/13/2020   Procedure: Carotid Stent;  Surgeon: Julieanne Cotton, MD;  Location: [facility] OR;  Service: Radiology;  Laterality: N/A;   Retained stone     ERCP secondary to retained stone   Right knee surgery  1984,1989,1989,1991,1994   Reconstructions for ACL insuff   TEE WITHOUT CARDIOVERSION N/A 04/02/2020   Procedure: TRANSESOPHAGEAL ECHOCARDIOGRAM (TEE);  Surgeon: Elease Hashimoto Deloris Ping, MD;  Location: [facility] ENDOSCOPY;  Service: Cardiovascular;  Laterality: N/A;   TUBAL LIGATION      Social History:  reports that she quit smoking about 18 months ago. Her smoking use included cigarettes. She started smoking about 42 years ago. She has a 10.3 pack-year smoking history. She has been exposed to tobacco smoke. She has never used smokeless tobacco. She reports current alcohol use. She reports that she does not currently use drugs after having used the following drugs: Marijuana.   Allergies  Allergen Reactions   Metolazone Other (See Comments)    Metabolic mood swings    Family History  Problem Relation Age of Onset   Heart disease Brother        MI in early 19's   Hypertension Mother    Hyperlipidemia Mother    Heart disease Mother    Hypertension Father    Asthma Father    Breast cancer Paternal Grandmother    Breast cancer Paternal Aunt       Prior to Admission medications   Medication Sig Start Date End Date Taking? Authorizing Provider  albuterol (PROVENTIL)  (2.5 MG/3ML) 0.083% nebulizer solution INHALE 1 VIAL VIA NEBULIZER EVERY 6 HOURS AS NEEDED FOR WHEEZING/SHORTNESS OF BREATH 03/22/23  Yes [name], MD  albuterol (VENTOLIN HFA) 108 (90 Base) MCG/ACT inhaler TAKE 2 PUFFS BY MOUTH EVERY 6 HOURS AS NEEDED FOR WHEEZE OR SHORTNESS OF BREATH 01/19/23  Yes [name], MD  ARIPiprazole (ABILIFY) 10 MG tablet Take 1 tablet (10 mg total) by mouth daily. 10/27/22  Yes [name], MD  aspirin EC 81 MG EC tablet Take 1 tablet (81 mg total) by mouth daily. Swallow whole. 02/05/20  Yes Narda Bonds, MD  atorvastatin (LIPITOR) 80 MG tablet TAKE 1 TABLET BY MOUTH EVERYDAY AT BEDTIME For cholesterol 10/27/22  Yes [name], MD  Budeson-Glycopyrrol-Formoterol (BREZTRI AEROSPHERE) 160-9-4.8 MCG/ACT AERO Inhale 2 puffs into the lungs in the morning and at bedtime. 12/11/22  Yes Nyoka Cowden, MD  cetirizine-pseudoephedrine (ZYRTEC-D) 5-120 MG tablet Take 1 tablet by mouth every  evening.   Yes [provider]  Cholecalciferol (VITAMIN D3) 25 MCG (1000 UT) CAPS Take 1 capsule (1,000 Units total) by mouth daily. 10/01/20  Yes [name], MD  dextromethorphan-guaiFENesin [facility] DM) 30-600 MG 12hr tablet Take 1 tablet by mouth 2 (two) times daily as needed for cough.   Yes [provider]  Dulaglutide (TRULICITY) 0.75 MG/0.5ML SOPN Inject 0.75 mg into the skin once a week. 10/27/22  Yes [name], MD  escitalopram (LEXAPRO) 20 MG tablet Take 1 tablet (20 mg total) by mouth daily. 10/27/22  Yes [name], MD  famotidine (PEPCID) 20 MG tablet One after supper Patient taking differently: Take 20 mg by mouth daily. 12/11/22  Yes Nyoka Cowden, MD  fluticasone (FLONASE) 50 MCG/ACT nasal spray PLACE 1 SPRAY INTO BOTH NOSTRILS 2 (TWO) TIMES DAILY 04/27/22  Yes [name], MD  furosemide (LASIX) 20 MG tablet TAKE 1 TABLET BY MOUTH EVERY DAY 01/19/23  Yes [name], MD  gabapentin (NEURONTIN) 300 MG  capsule Take 2 capsules (600 mg total) by mouth in the morning AND 3 capsules (900 mg total) at bedtime. 10/27/22  Yes [name], MD  JARDIANCE 10 MG TABS tablet TAKE 1 TABLET BY MOUTH EVERY DAY BEFORE BREAKFAST Patient taking differently: Take 10 mg by mouth daily. 06/17/22  Yes O'Neal, Ronnald Ramp, MD  LORazepam (ATIVAN) 0.5 MG tablet TAKE 1/2 TO 1 TABLET BY MOUTH TWICE A DAY AS NEEDED FOR ANXIETY 12/11/22  Yes [name], MD  metoprolol succinate (TOPROL-XL) 25 MG 24 hr tablet Take 1 tablet (25 mg total) by mouth daily. 12/09/22  Yes O'Neal, Ronnald Ramp, MD  Multiple Vitamin (MULTIVITAMIN WITH MINERALS) TABS tablet Take 1 tablet by mouth daily.   Yes [provider]  pantoprazole (PROTONIX) 40 MG tablet TAKE 1 TABLET BY MOUTH 2 TIMES DAILY 30-60 MIN BEFORE FIRST MEAL OF THE DAY Patient taking differently: Take 40 mg by mouth at bedtime. 01/20/23  Yes Glenford Bayley, NP  sacubitril-valsartan (ENTRESTO) 24-26 MG Take 1 tablet by mouth 2 (two) times daily. 12/09/22  Yes O'Neal, Ronnald Ramp, MD  spironolactone (ALDACTONE) 25 MG tablet Take 1 tablet (25 mg total) by mouth daily. 12/09/22  Yes O'Neal, Ronnald Ramp, MD  traMADol (ULTRAM) 50 MG tablet Take 1 tablet (50 mg total) by mouth 2 (two) times daily as needed for moderate pain. 12/11/22  Yes [name], MD  nitroGLYCERIN (NITROSTAT) 0.4 MG SL tablet Place 1 tablet (0.4 mg total) under the tongue every 5 (five) minutes as needed. 11/04/20   Arty Baumgartner, NP    Physical Exam: BP 136/71   Pulse 84   Temp 98.6 F (37 C) (Oral)   Resp 16   LMP 03/08/2013   SpO2 95%   General: 62 y.o. year-old female well developed well nourished in no acute distress.  Alert and oriented x3. Cardiovascular: Regular rate and rhythm with no rubs or gallops.  No thyromegaly or JVD noted.  No lower extremity edema. 2/4 pulses in all 4 extremities. Respiratory: Diffuse wheezing bilaterally.  Poor inspiratory effort. Abdomen:  Soft nontender nondistended with normal bowel sounds x4 quadrants. Muskuloskeletal: No cyanosis, clubbing or edema noted bilaterally Neuro: CN II-XII intact, strength, sensation, reflexes Skin: No ulcerative lesions noted or rashes Psychiatry: Judgement and insight appear normal. Mood is appropriate for condition and setting          Labs on Admission:  Basic Metabolic Panel: Recent Labs  Lab 04/03/23 2135  NA 140  K  3.7  CL 102  CO2 29  GLUCOSE 132*  BUN 14  CREATININE 1.14*  CALCIUM 8.6*   Liver Function Tests: No results for input(s): "AST", "ALT", "ALKPHOS", "BILITOT", "PROT", "ALBUMIN" in the last 168 hours. No results for input(s): "LIPASE", "AMYLASE" in the last 168 hours. No results for input(s): "AMMONIA" in the last 168 hours. CBC: Recent Labs  Lab 04/03/23 2046  WBC 15.3*  HGB 13.5  HCT 43.5  MCV 95.8  PLT 275   Cardiac Enzymes: No results for input(s): "CKTOTAL", "CKMB", "CKMBINDEX", "TROPONINI" in the last 168 hours.  BNP (last 3 results) Recent Labs    02/24/23 2220  BNP 117.5*    ProBNP (last 3 results) Recent Labs    10/14/22 1619 01/14/23 1036  PROBNP 19.0 34.0    CBG: No results for input(s): "GLUCAP" in the last 168 hours.  Radiological Exams on Admission: DG Chest Port 1 View  Result Date: 04/03/2023 CLINICAL DATA:  Shortness of breath EXAM: PORTABLE CHEST 1 VIEW COMPARISON:  02/24/2023 FINDINGS: Loop recorder device remains in place, unchanged. Heart and mediastinal contours are within normal limits. No focal opacities or effusions. No acute bony abnormality. IMPRESSION: No active disease. Electronically Signed   By: Charlett Nose M.D.   On: 04/03/2023 22:05    EKG: I independently viewed the EKG done and my findings are as followed: Sinus rhythm rate of 86.  Nonspecific ST-T changes.  QTc 438.  Assessment/Plan Present on Admission:  COPD with acute exacerbation (HCC)  Principal Problem:   COPD with acute exacerbation  (HCC)  COPD with acute exacerbation, unclear trigger Quit smoking many years ago Continue bronchodilators, IV steroids, IV azithromycin, early mobilization Also received IV magnesium in the ED. Continue monitoring  Acute on chronic hypoxic respiratory failure At baseline on 2 L nasal cannula Currently requiring 4 L to maintain O2 saturation above 90% Continue to treat underlying conditions Wean off oxygen supplementation as tolerated. Home O2 evaluation on 04/05/2023 with ambulation.  Leukocytosis, suspect reactive in the setting of steroid use, COPD exacerbation On IV Solu-Medrol 40 mg daily Continue IV azithromycin for its anti-inflammatory effect Monitor WBCs  Type 2 diabetes with hyperglycemia Last hemoglobin A1c 6.4 on 10/20/2022 Not on insulin prior to admission Monitor with CBGs twice daily  HFpEF Euvolemic on exam. Resume home regimen Monitor strict I's and O's and daily weight  Hyperlipidemia Resume home regimen  Hypertension BP is not at goal, elevated Resume home regimen Closely monitor vital signs.  GERD Resume home regimen  Bipolar disorder/chronic anxiety Resume home regimen  CKD 3A Currently at baseline Avoid nephrotoxic agents and hypotension Monitor urine output Repeat renal panel in the morning  Obesity Weight 122 kg Recommend weight loss outpatient with regular physical activity and healthy dieting.  Physical debility PT OT assessment Fall precautions.   Time: 75 minutes.   DVT prophylaxis: Subcu Lovenox daily  Code Status: Full code  Family Communication: None at bedside.  Disposition Plan: Admitted to telemetry unit  Consults called: None.  Admission status: Inpatient status.   Status is: Inpatient The patient requires at least 2 midnights for further evaluation and treatment of present condition.   Darlin Drop MD [facility] Pager [phone]  If 7PM-7AM, please contact  night-coverage [email or web address] Password [email or web address]  04/03/2023, 11:40 PM

## 2023-04-03 NOTE — ED Provider Notes (Signed)
[place] EMERGENCY DEPARTMENT AT [facility] Provider Note   CSN: [id number] Arrival date & time: 04/03/23  1954     History {Add pertinent medical, surgical, social history, OB history to HPI:1} Chief Complaint  Patient presents with   Shortness of Breath    Brenda [name] is a 62 y.o. female.  Patient complains of shortness of breath.  She has COPD   Shortness of Breath      Home Medications Prior to Admission medications   Medication Sig Start Date End Date Taking? Authorizing Provider  albuterol (PROVENTIL) (2.5 MG/3ML) 0.083% nebulizer solution INHALE 1 VIAL VIA NEBULIZER EVERY 6 HOURS AS NEEDED FOR WHEEZING/SHORTNESS OF BREATH 03/22/23   [name], MD  albuterol (VENTOLIN HFA) 108 (90 Base) MCG/ACT inhaler TAKE 2 PUFFS BY MOUTH EVERY 6 HOURS AS NEEDED FOR WHEEZE OR SHORTNESS OF BREATH 01/19/23   [name], MD  ARIPiprazole (ABILIFY) 10 MG tablet Take 1 tablet (10 mg total) by mouth daily. 10/27/22   [name], MD  aspirin EC 81 MG EC tablet Take 1 tablet (81 mg total) by mouth daily. Swallow whole. 02/05/20   Narda Bonds, MD  atorvastatin (LIPITOR) 80 MG tablet TAKE 1 TABLET BY MOUTH EVERYDAY AT BEDTIME For cholesterol 10/27/22   [name], MD  Budeson-Glycopyrrol-Formoterol (BREZTRI AEROSPHERE) 160-9-4.8 MCG/ACT AERO Inhale 2 puffs into the lungs in the morning and at bedtime. 12/11/22   Nyoka Cowden, MD  cetirizine-pseudoephedrine (ZYRTEC-D) 5-120 MG tablet Take 1 tablet by mouth every evening.    [provider]  Cholecalciferol (VITAMIN D3) 25 MCG (1000 UT) CAPS Take 1 capsule (1,000 Units total) by mouth daily. 10/01/20   [name], MD  dextromethorphan-guaiFENesin [facility] DM) 30-600 MG 12hr tablet Take 1 tablet by mouth 2 (two) times daily as needed for cough.    [provider]  Dulaglutide (TRULICITY) 0.75 MG/0.5ML SOPN Inject 0.75 mg into the skin once a week. 10/27/22   [name], MD   escitalopram (LEXAPRO) 20 MG tablet Take 1 tablet (20 mg total) by mouth daily. 10/27/22   [name], MD  famotidine (PEPCID) 20 MG tablet One after supper Patient taking differently: Take 20 mg by mouth daily. 12/11/22   Nyoka Cowden, MD  fluticasone (FLONASE) 50 MCG/ACT nasal spray PLACE 1 SPRAY INTO BOTH NOSTRILS 2 (TWO) TIMES DAILY 04/27/22   [name], MD  furosemide (LASIX) 20 MG tablet TAKE 1 TABLET BY MOUTH EVERY DAY 01/19/23   [name], MD  gabapentin (NEURONTIN) 300 MG capsule Take 2 capsules (600 mg total) by mouth in the morning AND 3 capsules (900 mg total) at bedtime. 10/27/22   [name], MD  JARDIANCE 10 MG TABS tablet TAKE 1 TABLET BY MOUTH EVERY DAY BEFORE BREAKFAST 06/17/22   O'Neal, Ronnald Ramp, MD  LORazepam (ATIVAN) 0.5 MG tablet TAKE 1/2 TO 1 TABLET BY MOUTH TWICE A DAY AS NEEDED FOR ANXIETY 12/11/22   [name], MD  metoprolol succinate (TOPROL-XL) 25 MG 24 hr tablet Take 1 tablet (25 mg total) by mouth daily. 12/09/22   O'NealRonnald Ramp, MD  Multiple Vitamin (MULTIVITAMIN WITH MINERALS) TABS tablet Take 1 tablet by mouth daily.    [provider]  nitroGLYCERIN (NITROSTAT) 0.4 MG SL tablet Place 1 tablet (0.4 mg total) under the tongue every 5 (five) minutes as needed. 11/04/20   Arty Baumgartner, NP  pantoprazole (PROTONIX) 40 MG tablet TAKE 1 TABLET BY MOUTH 2 TIMES DAILY 30-60 MIN BEFORE FIRST  MEAL OF THE DAY 01/20/23   Glenford Bayley, NP  sacubitril-valsartan (ENTRESTO) 24-26 MG Take 1 tablet by mouth 2 (two) times daily. 12/09/22   O'NealRonnald Ramp, MD  spironolactone (ALDACTONE) 25 MG tablet Take 1 tablet (25 mg total) by mouth daily. 12/09/22   O'Neal, Ronnald Ramp, MD  traMADol (ULTRAM) 50 MG tablet Take 1 tablet (50 mg total) by mouth 2 (two) times daily as needed for moderate pain. 12/11/22   [name], MD      Allergies    Metolazone    Review of Systems   Review of Systems  Respiratory:   Positive for shortness of breath.     Physical Exam Updated Vital Signs BP 136/71   Pulse 84   Temp 98.6 F (37 C) (Oral)   Resp 16   LMP 03/08/2013   SpO2 95%  Physical Exam  ED Results / Procedures / Treatments   Labs (all labs ordered are listed, but only abnormal results are displayed) Labs Reviewed  CBC - Abnormal; Notable for the following components:      Result Value   WBC 15.3 (*)    RDW 16.2 (*)    All other components within normal limits  BASIC METABOLIC PANEL - Abnormal; Notable for the following components:   Glucose, Bld 132 (*)    Creatinine, Ser 1.14 (*)    Calcium 8.6 (*)    GFR, Estimated 54 (*)    All other components within normal limits    EKG None  Radiology DG Chest Port 1 View  Result Date: 04/03/2023 CLINICAL DATA:  Shortness of breath EXAM: PORTABLE CHEST 1 VIEW COMPARISON:  02/24/2023 FINDINGS: Loop recorder device remains in place, unchanged. Heart and mediastinal contours are within normal limits. No focal opacities or effusions. No acute bony abnormality. IMPRESSION: No active disease. Electronically Signed   By: Charlett Nose M.D.   On: 04/03/2023 22:05    Procedures Procedures  {Document cardiac monitor, telemetry assessment procedure when appropriate:1}  Medications Ordered in ED Medications  albuterol (VENTOLIN HFA) 108 (90 Base) MCG/ACT inhaler 2 puff (has no administration in time range)  ipratropium-albuterol (DUONEB) 0.5-2.5 (3) MG/3ML nebulizer solution 3 mL (3 mLs Nebulization Given 04/03/23 2039)  albuterol (PROVENTIL) (2.5 MG/3ML) 0.083% nebulizer solution 2.5 mg (2.5 mg Nebulization Given 04/03/23 2039)  methylPREDNISolone sodium succinate (SOLU-MEDROL) 125 mg/2 mL injection 125 mg (125 mg Intramuscular Given 04/03/23 2046)  magnesium sulfate IVPB 2 g 50 mL (0 g Intravenous Stopped 04/03/23 2150)    ED Course/ Medical Decision Making/ A&P   {   Click here for ABCD2, HEART and other calculatorsREFRESH Note before signing :1}                               Medical Decision Making Amount and/or Complexity of Data Reviewed Labs: ordered.  Risk Prescription drug management. Decision regarding hospitalization.   COPD exacerbation  {Document critical care time when appropriate:1} {Document review of labs and clinical decision tools ie heart score, Chads2Vasc2 etc:1}  {Document your independent review of radiology images, and any outside records:1} {Document your discussion with family members, caretakers, and with consultants:1} {Document social determinants of health affecting pt's care:1} {Document your decision making why or why not admission, treatments were needed:1} Final Clinical Impression(s) / ED Diagnoses Final diagnoses:  COPD exacerbation (HCC)    Rx / DC Orders ED Discharge Orders     None

## 2023-04-04 DIAGNOSIS — J441 Chronic obstructive pulmonary disease with (acute) exacerbation: Secondary | ICD-10-CM | POA: Diagnosis not present

## 2023-04-04 LAB — PHOSPHORUS: Phosphorus: 2.8 mg/dL (ref 2.5–4.6)

## 2023-04-04 LAB — MAGNESIUM: Magnesium: 2.7 mg/dL — ABNORMAL HIGH (ref 1.7–2.4)

## 2023-04-04 LAB — BASIC METABOLIC PANEL
Anion gap: 6 (ref 5–15)
BUN: 14 mg/dL (ref 8–23)
CO2: 28 mmol/L (ref 22–32)
Calcium: 8.8 mg/dL — ABNORMAL LOW (ref 8.9–10.3)
Chloride: 105 mmol/L (ref 98–111)
Creatinine, Ser: 0.97 mg/dL (ref 0.44–1.00)
GFR, Estimated: >60 mL/min (ref >60)
Glucose, Bld: 162 mg/dL — ABNORMAL HIGH (ref 70–99)
Potassium: 4.4 mmol/L (ref 3.5–5.1)
Sodium: 139 mmol/L (ref 135–145)

## 2023-04-04 LAB — CBC
HCT: 43.1 % (ref 36.0–46.0)
Hemoglobin: 13.5 g/dL (ref 12.0–15.0)
MCH: 29.4 pg (ref 26.0–34.0)
MCHC: 31.3 g/dL (ref 30.0–36.0)
MCV: 93.9 fL (ref 80.0–100.0)
Platelets: 262 10*3/uL (ref 150–400)
RBC: 4.59 MIL/uL (ref 3.87–5.11)
RDW: 16.1 % — ABNORMAL HIGH (ref 11.5–15.5)
WBC: 14.7 10*3/uL — ABNORMAL HIGH (ref 4.0–10.5)
nRBC: 0 % (ref 0.0–0.2)

## 2023-04-04 LAB — CBG MONITORING, ED: Glucose-Capillary: 154 mg/dL — ABNORMAL HIGH (ref 70–99)

## 2023-04-04 LAB — PROCALCITONIN: Procalcitonin: <0.1 ng/mL

## 2023-04-04 MED ORDER — IPRATROPIUM-ALBUTEROL 0.5-2.5 (3) MG/3ML IN SOLN
3.0000 mL | RESPIRATORY_TRACT | Status: DC
  Administered 2023-04-04 – 2023-04-06 (×9): 3 mL via RESPIRATORY_TRACT
  Filled 2023-04-04 (×9): qty 3

## 2023-04-04 MED ORDER — ALBUTEROL SULFATE (2.5 MG/3ML) 0.083% IN NEBU: 2.5000 mg | INHALATION_SOLUTION | RESPIRATORY_TRACT | Status: DC | PRN

## 2023-04-04 MED ORDER — AZITHROMYCIN 250 MG PO TABS
500.0000 mg | ORAL_TABLET | Freq: Every day | ORAL | Status: DC
  Administered 2023-04-04 – 2023-04-11 (×8): 500 mg via ORAL
  Filled 2023-04-04 (×8): qty 2

## 2023-04-04 MED ORDER — TRAMADOL HCL 50 MG PO TABS
50.0000 mg | ORAL_TABLET | Freq: Four times a day (QID) | ORAL | Status: DC | PRN
  Administered 2023-04-04 – 2023-04-09 (×4): 50 mg via ORAL
  Filled 2023-04-04 (×5): qty 1

## 2023-04-04 NOTE — ED Notes (Signed)
ED TO INPATIENT HANDOFF REPORT  Name/Age/Gender Brenda [name] 62 y.o. female  Code Status    Code Status Orders  (From admission, onward)           Start     Ordered   04/03/23 2338  Full code  Continuous       Question:  By:  Answer:  Consent: discussion documented in EHR   04/03/23 2337           Code Status History     Date Active Date Inactive Code Status Order ID Comments User Context   02/25/2023 0035 03/02/2023 1715 Full Code [id number]  Tereasa Coop, MD ED   11/02/2020 1612 11/04/2020 1754 Full Code [id number]  Yvonne Kendall, MD Inpatient   02/13/2020 1121 02/14/2020 1904 Full Code [id number]  Julieanne Cotton, MD Inpatient   02/08/2020 0015 02/13/2020 1120 Full Code [id number]  Briscoe Deutscher, MD ED   01/30/2020 1331 02/04/2020 2100 Full Code [id number]  Emeline General, MD ED   07/05/2017 1604 07/08/2017 1746 Full Code [id number]  Haydee Monica, MD ED   07/20/2015 1606 07/26/2015 1430 Full Code [id number]  Gwenyth Bender, NP ED   05/30/2013 1805 06/02/2013 1635 Full Code [id number]  Drema Dallas, MD Inpatient       Home/SNF/Other Home  Chief Complaint COPD with acute exacerbation (HCC) [J44.1]  Level of Care/Admitting Diagnosis ED Disposition     ED Disposition  Admit   Condition  --   Comment  Hospital Area: [facility] [[id number]]  Level of Care: Telemetry [5]  Admit to tele based on following criteria: Monitor for Ischemic changes  May admit patient to [facility] or [facility] if equivalent level of care is available:: Yes  Covid Evaluation: Asymptomatic - no recent exposure (last 10 days) testing not required  Diagnosis: COPD with acute exacerbation [facility]) [[id number]]  Admitting Physician: Darlin Drop [[id number]]  Attending Physician: Darlin Drop [[id number]]  Certification:: I certify this patient will need inpatient services for at least 2 midnights  Expected Medical Readiness: 04/05/2023          Medical History Past Medical  History:  Diagnosis Date   Acute pancreatitis    Asthma    main dyspnea thought due to deconditioning (10/2013)   Bipolar I disorder, most recent episode (or current) manic, unspecified    pt denies this   Bronchitis, chronic obstructive (HCC) 2008 & 2009   FeV1 64% TLC 105% DLCO 55% 2008  -  FeV1 81% FeF 25-75 43% 2009   CAD (coronary artery disease)    inferior STEMI in 10/2020 s/p DES to RCA   Chronic combined systolic and diastolic CHF (congestive heart failure) (HCC)    LVEF 30-35% in 01/2020 but improved to 60-65% in 05/2021   COPD (chronic obstructive pulmonary disease) (HCC)    emphysema, not an issue per pulm (10/2013)   History of pyelonephritis 05/2012   History of ST elevation myocardial infarction (STEMI)    HTN (hypertension)    Hx of migraines    Hyperlipidemia    Knee pain, right    Lumbar disc disease with radiculopathy    multilevel spondylitic changes, scoliosis, and anterolisthesis L4 not thought to currently be surg candidate (Dr. Phoebe Perch, [facility]) (Dr. Ollen Bowl, [place])   Nicotine addiction    Obesity    RSV (respiratory syncytial virus pneumonia) 01/2020   hospitalization   Suicide attempt (HCC) 06/2001   Swelling of limb    TIA (  transient ischemic attack)    Type 2 diabetes mellitus (HCC)    Urge and stress incontinence 07/2012   ([facility])    Allergies Allergies  Allergen Reactions   Metolazone Other (See Comments)    Metabolic mood swings    IV Location/Drains/Wounds Patient Lines/Drains/Airways Status     Active Line/Drains/Airways     Name Placement date Placement time Site Days   Peripheral IV 04/03/23 22 G 1" Posterior;Right Wrist 04/03/23  2047  Wrist  1            Labs/Imaging Results for orders placed or performed during the hospital encounter of 04/03/23 (from the past 48 hour(s))  CBC     Status: Abnormal   Collection Time: 04/03/23  8:46 PM  Result Value Ref Range   WBC 15.3 (H) 4.0 - 10.5 K/uL   RBC 4.54 3.87 - 5.11 MIL/uL    Hemoglobin 13.5 12.0 - 15.0 g/dL   HCT 09.8 11.9 - 14.7 %   MCV 95.8 80.0 - 100.0 fL   MCH 29.7 26.0 - 34.0 pg   MCHC 31.0 30.0 - 36.0 g/dL   RDW 82.9 (H) 56.2 - 13.0 %   Platelets 275 150 - 400 K/uL   nRBC 0.0 0.0 - 0.2 %    Comment: Performed at [facility], [address], [place], [place] [address]  Basic metabolic panel     Status: Abnormal   Collection Time: 04/03/23  9:35 PM  Result Value Ref Range   Sodium 140 135 - 145 mmol/L   Potassium 3.7 3.5 - 5.1 mmol/L   Chloride 102 98 - 111 mmol/L   CO2 29 22 - 32 mmol/L   Glucose, Bld 132 (H) 70 - 99 mg/dL    Comment: Glucose reference range applies only to samples taken after fasting for at least 8 hours.   BUN 14 8 - 23 mg/dL   Creatinine, Ser 4.69 (H) 0.44 - 1.00 mg/dL   Calcium 8.6 (L) 8.9 - 10.3 mg/dL   GFR, Estimated 54 (L) >60 mL/min    Comment: (NOTE) Calculated using the CKD-EPI Creatinine Equation (2021)    Anion gap 9 5 - 15    Comment: Performed at [facility], [address] [place], [place] [address]  CBC     Status: Abnormal   Collection Time: 04/04/23  5:00 AM  Result Value Ref Range   WBC 14.7 (H) 4.0 - 10.5 K/uL   RBC 4.59 3.87 - 5.11 MIL/uL   Hemoglobin 13.5 12.0 - 15.0 g/dL   HCT 84.1 32.4 - 40.1 %   MCV 93.9 80.0 - 100.0 fL   MCH 29.4 26.0 - 34.0 pg   MCHC 31.3 30.0 - 36.0 g/dL   RDW 02.7 (H) 25.3 - 66.4 %   Platelets 262 150 - 400 K/uL   nRBC 0.0 0.0 - 0.2 %    Comment: Performed at [facility], [address], [place], [place] [address]  Procalcitonin     Status: None   Collection Time: 04/04/23  5:00 AM  Result Value Ref Range   Procalcitonin <0.10 ng/mL    Comment:        Interpretation: PCT (Procalcitonin) <= 0.5 ng/mL: Systemic infection (sepsis) is not likely. Local bacterial infection is possible. (NOTE)       Sepsis PCT Algorithm           Lower Respiratory Tract  Infection PCT  Algorithm    ----------------------------     ----------------------------         PCT < 0.25 ng/mL                PCT < 0.10 ng/mL          Strongly encourage             Strongly discourage   discontinuation of antibiotics    initiation of antibiotics    ----------------------------     -----------------------------       PCT 0.25 - 0.50 ng/mL            PCT 0.10 - 0.25 ng/mL               OR       >80% decrease in PCT            Discourage initiation of                                            antibiotics      Encourage discontinuation           of antibiotics    ----------------------------     -----------------------------         PCT >= 0.50 ng/mL              PCT 0.26 - 0.50 ng/mL               AND        <80% decrease in PCT             Encourage initiation of                                             antibiotics       Encourage continuation           of antibiotics    ----------------------------     -----------------------------        PCT >= 0.50 ng/mL                  PCT > 0.50 ng/mL               AND         increase in PCT                  Strongly encourage                                      initiation of antibiotics    Strongly encourage escalation           of antibiotics                                     -----------------------------                                           PCT <= 0.25 ng/mL  OR                                        > 80% decrease in PCT                                      Discontinue / Do not initiate                                             antibiotics  Performed at [facility], [address], [place], [place] [address]   Basic metabolic panel     Status: Abnormal   Collection Time: 04/04/23  5:00 AM  Result Value Ref Range   Sodium 139 135 - 145 mmol/L   Potassium 4.4 3.5 - 5.1 mmol/L   Chloride 105 98 - 111 mmol/L   CO2 28 22 - 32 mmol/L   Glucose, Bld  162 (H) 70 - 99 mg/dL    Comment: Glucose reference range applies only to samples taken after fasting for at least 8 hours.   BUN 14 8 - 23 mg/dL   Creatinine, Ser 9.56 0.44 - 1.00 mg/dL   Calcium 8.8 (L) 8.9 - 10.3 mg/dL   GFR, Estimated >38 >75 mL/min    Comment: (NOTE) Calculated using the CKD-EPI Creatinine Equation (2021)    Anion gap 6 5 - 15    Comment: Performed at [facility], [address], [place], [place] [address]  Magnesium     Status: Abnormal   Collection Time: 04/04/23  5:00 AM  Result Value Ref Range   Magnesium 2.7 (H) 1.7 - 2.4 mg/dL    Comment: Performed at [facility], [address], [place], [place] [address]  Phosphorus     Status: None   Collection Time: 04/04/23  5:00 AM  Result Value Ref Range   Phosphorus 2.8 2.5 - 4.6 mg/dL    Comment: Performed at [facility], [address], [place], [place] [address]  CBG monitoring, ED     Status: Abnormal   Collection Time: 04/04/23  9:35 AM  Result Value Ref Range   Glucose-Capillary 154 (H) 70 - 99 mg/dL    Comment: Glucose reference range applies only to samples taken after fasting for at least 8 hours.   DG Chest Port 1 View  Result Date: 04/03/2023 CLINICAL DATA:  Shortness of breath EXAM: PORTABLE CHEST 1 VIEW COMPARISON:  02/24/2023 FINDINGS: Loop recorder device remains in place, unchanged. Heart and mediastinal contours are within normal limits. No focal opacities or effusions. No acute bony abnormality. IMPRESSION: No active disease. Electronically Signed   By: Charlett Nose M.D.   On: 04/03/2023 22:05    Pending Labs Unresulted Labs (From admission, onward)     Start     Ordered   04/10/23 0500  Creatinine, serum  (enoxaparin (LOVENOX)    CrCl >/= 30 ml/min)  Weekly,   R     Comments: while on enoxaparin therapy    04/03/23 2337            Vitals/Pain Today's Vitals   04/04/23 0827 04/04/23 1103 04/04/23 1200 04/04/23 1217  BP:   116/75  126/71   Pulse:  96 79   Resp:  11 14   Temp: (!) 97.5 F (36.4 C)   97.6 F (36.4 C)  TempSrc: Oral   Oral  SpO2:  99% 98%   PainSc:        Isolation Precautions No active isolations  Medications Medications  albuterol (VENTOLIN HFA) 108 (90 Base) MCG/ACT inhaler 2 puff (has no administration in time range)  enoxaparin (LOVENOX) injection 40 mg (40 mg Subcutaneous Given 04/04/23 0912)  ipratropium-albuterol (DUONEB) 0.5-2.5 (3) MG/3ML nebulizer solution 3 mL (3 mLs Nebulization Given 04/04/23 1342)  acetaminophen (TYLENOL) tablet 650 mg (has no administration in time range)  prochlorperazine (COMPAZINE) injection 5 mg (has no administration in time range)  polyethylene glycol (MIRALAX / GLYCOLAX) packet 17 g (has no administration in time range)  melatonin tablet 5 mg (has no administration in time range)  ARIPiprazole (ABILIFY) tablet 10 mg (10 mg Oral Given 04/04/23 0920)  aspirin EC tablet 81 mg (81 mg Oral Given 04/04/23 0912)  atorvastatin (LIPITOR) tablet 80 mg (80 mg Oral Given 04/04/23 0912)  escitalopram (LEXAPRO) tablet 20 mg (20 mg Oral Given 04/04/23 0912)  famotidine (PEPCID) tablet 20 mg (20 mg Oral Given 04/04/23 0912)  gabapentin (NEURONTIN) capsule 300 mg (300 mg Oral Given 04/04/23 0912)    And  gabapentin (NEURONTIN) capsule 300 mg (has no administration in time range)  multivitamin with minerals tablet 1 tablet (1 tablet Oral Given 04/04/23 0920)  pantoprazole (PROTONIX) EC tablet 40 mg (40 mg Oral Given 04/04/23 0012)  sacubitril-valsartan (ENTRESTO) 24-26 mg per tablet (1 tablet Oral Given 04/04/23 0913)  spironolactone (ALDACTONE) tablet 25 mg (25 mg Oral Given 04/04/23 0921)  umeclidinium bromide (INCRUSE ELLIPTA) 62.5 MCG/ACT 1 puff (1 puff Inhalation Given 04/04/23 0913)  mometasone-formoterol (DULERA) 100-5 MCG/ACT inhaler 2 puff (2 puffs Inhalation Given 04/04/23 0914)  methylPREDNISolone sodium succinate (SOLU-MEDROL) 40 mg/mL injection 40 mg (40 mg  Intravenous Given 04/04/23 0913)  azithromycin (ZITHROMAX) tablet 500 mg (has no administration in time range)  traMADol (ULTRAM) tablet 50 mg (has no administration in time range)  ipratropium-albuterol (DUONEB) 0.5-2.5 (3) MG/3ML nebulizer solution 3 mL (3 mLs Nebulization Given 04/03/23 2039)  albuterol (PROVENTIL) (2.5 MG/3ML) 0.083% nebulizer solution 2.5 mg (2.5 mg Nebulization Given 04/03/23 2039)  methylPREDNISolone sodium succinate (SOLU-MEDROL) 125 mg/2 mL injection 125 mg (125 mg Intramuscular Given 04/03/23 2046)  magnesium sulfate IVPB 2 g 50 mL (0 g Intravenous Stopped 04/03/23 2150)    Mobility walks

## 2023-04-04 NOTE — Progress Notes (Signed)
   04/04/23 2019  BiPAP/CPAP/SIPAP  Reason BIPAP/CPAP not in use Other(comment) (pt prefers not to wear it tonight)

## 2023-04-04 NOTE — Progress Notes (Signed)
PROGRESS NOTE    Brenda [name]  EAV:[id number] DOB: 1960-09-27 DOA: 04/03/2023 PCP: [name], MD   Brief Narrative: 62 y.o. female with medical history significant for coronary artery disease, chronic HFpEF, CKD 3A, COPD, former smoker, chronic hypoxic respiratory failure on 2 L nasal cannula, essential hypertension, type 2 diabetes, diabetic polyneuropathy, hyperlipidemia, GERD, severe morbid obesity, bipolar disorder/generalized anxiety disorder, who presented to the ED from home due to worsening dyspnea with minimal exertion, coughing, and wheezing for the past 2 days.  No reported subjective fevers or chills.   In the ED, hypoxic with O2 requirement above baseline of 2 L.  Chest x-ray was nonacute.  Audible wheezing noted.  The patient received bronchodilators, IV magnesium, and IV steroids.  TRH, hospitalist service, was asked to admit.   ED Course: Temperature 98.6.  BP 156/93, pulse 87, respiratory 27, saturation 95% on 4 L.  WBC 15.3, serum glucose 132, creatinine 1.14, GFR 54.  Assessment & Plan:   Principal Problem:   COPD with acute exacerbation (HCC)   COPD with acute exacerbation, unclear trigger Quit smoking many years ago Continue bronchodilators, IV steroids, IV azithromycin, early mobilization   Acute on chronic hypoxic respiratory failure At baseline on 2 L nasal cannula Currently requiring 4 L to maintain O2 saturation above 90%   Leukocytosis, suspect reactive in the setting of steroid use, COPD exacerbation  IV Solu-Medrol 40 mg daily Continue IV azithromycin    Type 2 diabetes with hyperglycemia Last hemoglobin A1c 6.4 on 10/20/2022 Not on insulin prior to admission Monitor with CBGs twice daily   HFpEF Euvolemic on exam. Resume home regimen Monitor strict I's and O's and daily weight   Hyperlipidemia Resume home regimen   Hypertension soft on Entresto and aldactone  GERD Resume home regimen   Bipolar disorder/chronic anxiety Resume home  regimen abilify lexapro   CKD 3A Currently at baseline Avoid nephrotoxic agents and hypotension   Obesity Weight 122 kg Recommend weight loss outpatient with regular physical activity and healthy dieting.  ?  Undiagnosed sleep apnea not on CPAP at home will need a sleep study as an outpatient  Estimated body mass index is 52.54 kg/m as calculated from the following:   Height as of 03/16/23: 5' (1.524 m).   Weight as of 03/16/23: 122 kg.  DVT prophylaxis: lovenox Code Status: full Family Communication:none Disposition Plan:  Status is: Inpatient Remains inpatient appropriate because: acute copd ex   Consultants: none  Procedures:none  Antimicrobials:azithromycin Subjective:  Patient sitting up in bed on 4 L of oxygen she is on O2 at home at 2 L at baseline  Objective: Vitals:   04/04/23 0630 04/04/23 0800 04/04/23 0827 04/04/23 1103  BP: (!) 142/86 (!) 128/106  116/75  Pulse: 82 89  96  Resp: 16 20  11   Temp:   (!) 97.5 F (36.4 C)   TempSrc:   Oral   SpO2: 97% 97%  99%    Intake/Output Summary (Last 24 hours) at 04/04/2023 1105 Last data filed at 04/04/2023 0119 Gross per 24 hour  Intake 250.5 ml  Output --  Net 250.5 ml   There were no vitals filed for this visit.  Examination:  General exam: Appears no acute distress Respiratory system: Bilateral wheezing to auscultation. Respiratory effort normal. Cardiovascular system: S1 & S2 heard, RRR. No JVD, murmurs, rubs, gallops or clicks. No pedal edema. Gastrointestinal system: Abdomen is nondistended, soft and nontender. No organomegaly or masses felt. Normal bowel sounds heard. Central nervous  system: Alert and oriented. No focal neurological deficits. Extremities: trace edema     Data Reviewed: I have personally reviewed following labs and imaging studies  CBC: Recent Labs  Lab 04/03/23 2046 04/04/23 0500  WBC 15.3* 14.7*  HGB 13.5 13.5  HCT 43.5 43.1  MCV 95.8 93.9  PLT 275 262   Basic  Metabolic Panel: Recent Labs  Lab 04/03/23 2135 04/04/23 0500  NA 140 139  K 3.7 4.4  CL 102 105  CO2 29 28  GLUCOSE 132* 162*  BUN 14 14  CREATININE 1.14* 0.97  CALCIUM 8.6* 8.8*  MG  --  2.7*  PHOS  --  2.8   GFR: CrCl cannot be calculated (Unknown ideal weight.). Liver Function Tests: No results for input(s): "AST", "ALT", "ALKPHOS", "BILITOT", "PROT", "ALBUMIN" in the last 168 hours. No results for input(s): "LIPASE", "AMYLASE" in the last 168 hours. No results for input(s): "AMMONIA" in the last 168 hours. Coagulation Profile: No results for input(s): "INR", "PROTIME" in the last 168 hours. Cardiac Enzymes: No results for input(s): "CKTOTAL", "CKMB", "CKMBINDEX", "TROPONINI" in the last 168 hours. BNP (last 3 results) Recent Labs    10/14/22 1619 01/14/23 1036  PROBNP 19.0 34.0   HbA1C: No results for input(s): "HGBA1C" in the last 72 hours. CBG: Recent Labs  Lab 04/04/23 0935  GLUCAP 154*   Lipid Profile: No results for input(s): "CHOL", "HDL", "LDLCALC", "TRIG", "CHOLHDL", "LDLDIRECT" in the last 72 hours. Thyroid Function Tests: No results for input(s): "TSH", "T4TOTAL", "FREET4", "T3FREE", "THYROIDAB" in the last 72 hours. Anemia Panel: No results for input(s): "VITAMINB12", "FOLATE", "FERRITIN", "TIBC", "IRON", "RETICCTPCT" in the last 72 hours. Sepsis Labs: Recent Labs  Lab 04/04/23 0500  PROCALCITON <0.10    No results found for this or any previous visit (from the past 240 hour(s)).       Radiology Studies: DG Chest Port 1 View  Result Date: 04/03/2023 CLINICAL DATA:  Shortness of breath EXAM: PORTABLE CHEST 1 VIEW COMPARISON:  02/24/2023 FINDINGS: Loop recorder device remains in place, unchanged. Heart and mediastinal contours are within normal limits. No focal opacities or effusions. No acute bony abnormality. IMPRESSION: No active disease. Electronically Signed   By: Charlett Nose M.D.   On: 04/03/2023 22:05     Scheduled Meds:   ARIPiprazole  10 mg Oral Daily   aspirin EC  81 mg Oral Daily   atorvastatin  80 mg Oral Daily   enoxaparin (LOVENOX) injection  40 mg Subcutaneous Q24H   escitalopram  20 mg Oral Daily   famotidine  20 mg Oral Daily   gabapentin  300 mg Oral Daily   And   gabapentin  300 mg Oral QHS   ipratropium-albuterol  3 mL Nebulization Q6H   methylPREDNISolone (SOLU-MEDROL) injection  40 mg Intravenous Daily   mometasone-formoterol  2 puff Inhalation BID   multivitamin with minerals  1 tablet Oral Daily   pantoprazole  40 mg Oral QHS   sacubitril-valsartan  1 tablet Oral BID   spironolactone  25 mg Oral Daily   umeclidinium bromide  1 puff Inhalation Daily   Continuous Infusions:  azithromycin Stopped (04/04/23 0119)     LOS: 1 day    Time spent: 39 min  Alwyn Ren, MD 04/04/2023, 11:05 AM

## 2023-04-04 NOTE — Plan of Care (Signed)
Will continue monitoring overnight, administering PRN nebs and titrating oxygen as tolerated.

## 2023-04-04 NOTE — Progress Notes (Deleted)
   Cardiology Office Note    Date:  04/04/2023  ID:  Brenda [name], DOB 10/13/1960, MRN [id number] PCP:  [name], MD  Cardiologist:  Reatha Harps, MD  Electrophysiologist:  None   Chief Complaint: ***  History of Present Illness: .    Brenda [name] is a 62 y.o. female with visit-pertinent history of ***  Labwork independently reviewed:   ROS: .    Please see the history of present illness. Otherwise, review of systems is positive for ***.  All other systems are reviewed and otherwise negative.  Studies Reviewed: Marland Kitchen    EKG:  EKG is ordered today, personally reviewed, demonstrating ***  CV Studies: Cardiac studies reviewed are outlined and summarized above. Otherwise please see EMR for full report.   Current Reported Medications:.    No outpatient medications have been marked as taking for the 04/06/23 encounter (Appointment) with Rip Harbour, NP.    Physical Exam:    VS:  LMP 03/08/2013    Wt Readings from Last 3 Encounters:  04/04/23 273 lb 2.4 oz (123.9 kg)  03/16/23 269 lb (122 kg)  03/02/23 258 lb 11.2 oz (117.3 kg)    GEN: Well nourished, well developed in no acute distress NECK: No JVD; No carotid bruits CARDIAC: ***RRR, no murmurs, rubs, gallops RESPIRATORY:  Clear to auscultation without rales, wheezing or rhonchi  ABDOMEN: Soft, non-tender, non-distended EXTREMITIES:  No edema; No acute deformity   Asessement and Plan:.     ***     Disposition: F/u with ***  Signed, Rip Harbour, NP

## 2023-04-05 DIAGNOSIS — J441 Chronic obstructive pulmonary disease with (acute) exacerbation: Secondary | ICD-10-CM | POA: Diagnosis not present

## 2023-04-05 LAB — GLUCOSE, CAPILLARY
Glucose-Capillary: 114 mg/dL — ABNORMAL HIGH (ref 70–99)
Glucose-Capillary: 182 mg/dL — ABNORMAL HIGH (ref 70–99)

## 2023-04-05 NOTE — Progress Notes (Signed)
PROGRESS NOTE    Brenda [name]  WUJ:[id number] DOB: 01-Dec-1960 DOA: 04/03/2023 PCP: [name], MD   Brief Narrative: 62 y.o. female with medical history significant for coronary artery disease, chronic HFpEF, CKD 3A, COPD, former smoker, chronic hypoxic respiratory failure on 2 L nasal cannula, essential hypertension, type 2 diabetes, diabetic polyneuropathy, hyperlipidemia, GERD, severe morbid obesity, bipolar disorder/generalized anxiety disorder, who presented to the ED from home due to worsening dyspnea with minimal exertion, coughing, and wheezing for the past 2 days.  No reported subjective fevers or chills.   In the ED, hypoxic with O2 requirement above baseline of 2 L.  Chest x-ray was nonacute.  Audible wheezing noted.  The patient received bronchodilators, IV magnesium, and IV steroids.  TRH, hospitalist service, was asked to admit.   ED Course: Temperature 98.6.  BP 156/93, pulse 87, respiratory 27, saturation 95% on 4 L.  WBC 15.3, serum glucose 132, creatinine 1.14, GFR 54.  Assessment & Plan:   Principal Problem:   COPD with acute exacerbation (HCC)   COPD with acute exacerbation, unclear trigger Quit smoking many years ago Continue bronchodilators, IV steroids, IV azithromycin, early mobilization Out of bed ambulate titrate oxygen Hopefully home soon   Acute on chronic hypoxic respiratory failure At baseline on 2 L nasal cannula Still requiring 4 L of oxygen   Leukocytosis, improving suspect reactive in the setting of steroid use, COPD exacerbation  IV Solu-Medrol 40 mg daily Continue IV azithromycin    Type 2 diabetes with hyperglycemia Last hemoglobin A1c 6.4 on 10/20/2022 Not on insulin prior to admission Monitor with CBGs twice daily   HFpEF Euvolemic on exam. Resume home regimen Monitor strict I's and O's and daily weight   Hyperlipidemia Resume home regimen   Hypertension soft on Entresto and aldactone  GERD Resume home regimen   Bipolar  disorder/chronic anxiety Resume home regimen abilify lexapro   CKD 3A Currently at baseline Avoid nephrotoxic agents and hypotension   Obesity Weight 122 kg Recommend weight loss outpatient with regular physical activity and healthy dieting.  ?  Undiagnosed sleep apnea not on CPAP at home will need a sleep study as an outpatient  Estimated body mass index is 53.35 kg/m as calculated from the following:   Height as of this encounter: 5' (1.524 m).   Weight as of this encounter: 123.9 kg.  DVT prophylaxis: lovenox Code Status: full Family Communication:none Disposition Plan:  Status is: Inpatient Remains inpatient appropriate because: acute copd ex   Consultants: none  Procedures:none  Antimicrobials:azithromycin Subjective:  C/o sob coughing   Objective: Vitals:   04/04/23 2359 04/05/23 0417 04/05/23 0445 04/05/23 0743  BP:   114/69   Pulse:   79   Resp:   18   Temp:   98 F (36.7 C)   TempSrc:   Oral   SpO2: 98% 95% 96% 95%  Weight:      Height:        Intake/Output Summary (Last 24 hours) at 04/05/2023 1014 Last data filed at 04/05/2023 9562 Gross per 24 hour  Intake 240 ml  Output 650 ml  Net -410 ml   Filed Weights   04/04/23 1448  Weight: 123.9 kg    Examination:  General exam: Appears no acute distress Respiratory system: Bilateral wheezing to auscultation.  Cardiovascular system: S1 & S2 heard, RRR. No JVD, murmurs, rubs, gallops or clicks. No pedal edema. Gastrointestinal system: Abdomen is nondistended, soft and nontender. No organomegaly or masses felt. Normal bowel sounds  heard. Central nervous system: Alert and oriented. No focal neurological deficits. Extremities: trace edema     Data Reviewed: I have personally reviewed following labs and imaging studies  CBC: Recent Labs  Lab 04/03/23 2046 04/04/23 0500  WBC 15.3* 14.7*  HGB 13.5 13.5  HCT 43.5 43.1  MCV 95.8 93.9  PLT 275 262   Basic Metabolic Panel: Recent Labs  Lab  04/03/23 2135 04/04/23 0500  NA 140 139  K 3.7 4.4  CL 102 105  CO2 29 28  GLUCOSE 132* 162*  BUN 14 14  CREATININE 1.14* 0.97  CALCIUM 8.6* 8.8*  MG  --  2.7*  PHOS  --  2.8   GFR: Estimated Creatinine Clearance: 71.1 mL/min (by C-G formula based on SCr of 0.97 mg/dL). Liver Function Tests: No results for input(s): "AST", "ALT", "ALKPHOS", "BILITOT", "PROT", "ALBUMIN" in the last 168 hours. No results for input(s): "LIPASE", "AMYLASE" in the last 168 hours. No results for input(s): "AMMONIA" in the last 168 hours. Coagulation Profile: No results for input(s): "INR", "PROTIME" in the last 168 hours. Cardiac Enzymes: No results for input(s): "CKTOTAL", "CKMB", "CKMBINDEX", "TROPONINI" in the last 168 hours. BNP (last 3 results) Recent Labs    10/14/22 1619 01/14/23 1036  PROBNP 19.0 34.0   HbA1C: No results for input(s): "HGBA1C" in the last 72 hours. CBG: Recent Labs  Lab 04/04/23 0935 04/05/23 0715  GLUCAP 154* 114*   Lipid Profile: No results for input(s): "CHOL", "HDL", "LDLCALC", "TRIG", "CHOLHDL", "LDLDIRECT" in the last 72 hours. Thyroid Function Tests: No results for input(s): "TSH", "T4TOTAL", "FREET4", "T3FREE", "THYROIDAB" in the last 72 hours. Anemia Panel: No results for input(s): "VITAMINB12", "FOLATE", "FERRITIN", "TIBC", "IRON", "RETICCTPCT" in the last 72 hours. Sepsis Labs: Recent Labs  Lab 04/04/23 0500  PROCALCITON <0.10    No results found for this or any previous visit (from the past 240 hour(s)).       Radiology Studies: DG Chest Port 1 View  Result Date: 04/03/2023 CLINICAL DATA:  Shortness of breath EXAM: PORTABLE CHEST 1 VIEW COMPARISON:  02/24/2023 FINDINGS: Loop recorder device remains in place, unchanged. Heart and mediastinal contours are within normal limits. No focal opacities or effusions. No acute bony abnormality. IMPRESSION: No active disease. Electronically Signed   By: Charlett Nose M.D.   On: 04/03/2023 22:05      Scheduled Meds:  ARIPiprazole  10 mg Oral Daily   aspirin EC  81 mg Oral Daily   atorvastatin  80 mg Oral Daily   azithromycin  500 mg Oral Daily   enoxaparin (LOVENOX) injection  40 mg Subcutaneous Q24H   escitalopram  20 mg Oral Daily   famotidine  20 mg Oral Daily   gabapentin  300 mg Oral Daily   And   gabapentin  300 mg Oral QHS   ipratropium-albuterol  3 mL Nebulization Q4H   methylPREDNISolone (SOLU-MEDROL) injection  40 mg Intravenous Daily   mometasone-formoterol  2 puff Inhalation BID   multivitamin with minerals  1 tablet Oral Daily   pantoprazole  40 mg Oral QHS   sacubitril-valsartan  1 tablet Oral BID   spironolactone  25 mg Oral Daily   umeclidinium bromide  1 puff Inhalation Daily   Continuous Infusions:     LOS: 2 days    Time spent: 39 min  Alwyn Ren, MD 04/05/2023, 10:14 AM

## 2023-04-05 NOTE — Evaluation (Signed)
Occupational Therapy Evaluation Patient Details Name: Brenda [name] MRN: [id number] DOB: 04/27/1961 Today's Date: 04/05/2023   History of Present Illness Patient is a 62 year old female who presented to the ED 04/03/23  from home due to worsening dyspnea with minimal exertion, coughing, and wheezing for the past 2 days.  No reported subjective fevers or chills.PMH: coronary artery disease, chronic HFpEF, CKD 3A, COPD, former smoker, chronic hypoxic respiratory failure on 2 L nasal cannula, essential hypertension, type 2 diabetes, diabetic polyneuropathy, hyperlipidemia, GERD, severe morbid obesity, bipolar disorder/generalized anxiety disorder,   Clinical Impression   Patient is a 62 year old female who was admitted for above. Patient        If plan is discharge home, recommend the following: A little help with walking and/or transfers;Direct supervision/assist for medications management;Assist for transportation;Help with stairs or ramp for entrance;Direct supervision/assist for financial management;Assistance with cooking/housework;A little help with bathing/dressing/bathroom    Functional Status Assessment  Patient has had a recent decline in their functional status and demonstrates the ability to make significant improvements in function in a reasonable and predictable amount of time.  Equipment Recommendations  None recommended by OT       Precautions / Restrictions Precautions Precautions: Fall Precaution Comments: monitor O2 Restrictions Weight Bearing Restrictions: No      Mobility Bed Mobility Overal bed mobility: Independent                  Transfers Overall transfer level: Needs assistance Equipment used: None Transfers: Sit to/from Stand Sit to Stand: Supervision                  Balance Overall balance assessment: Mild deficits observed, not formally tested           ADL either performed or assessed with clinical judgement   ADL Overall  ADL's : Needs assistance/impaired Eating/Feeding: Modified independent     Grooming Details (indicate cue type and reason): declined to participate in this task with increased SOB after movement. provided with materials. Upper Body Bathing: Minimal assistance;Sitting   Lower Body Bathing: Maximal assistance;Sitting/lateral leans   Upper Body Dressing : Minimal assistance;Sitting   Lower Body Dressing: Maximal assistance;Sitting/lateral leans   Toilet Transfer: Minimal assistance;+2 for safety/equipment;Ambulation Toilet Transfer Details (indicate cue type and reason): no AD with patient unsteady with movements and reaching out for furniture walking back into room.                 Vision   Vision Assessment?: No apparent visual deficits            Pertinent Vitals/Pain Pain Assessment Pain Assessment: No/denies pain     Extremity/Trunk Assessment Upper Extremity Assessment Upper Extremity Assessment: Overall WFL for tasks assessed   Lower Extremity Assessment Lower Extremity Assessment: Defer to PT evaluation   Cervical / Trunk Assessment Cervical / Trunk Assessment: Normal      Cognition Arousal: Alert Behavior During Therapy: WFL for tasks assessed/performed Overall Cognitive Status: Within Functional Limits for tasks assessed                      Home Living Family/patient expects to be discharged to:: Private residence Living Arrangements: Parent Available Help at Discharge: Family;Available 24 hours/day Type of Home: House Home Access: Stairs to enter [employer] of Steps: 2 Entrance Stairs-Rails: Right;Left Home Layout: One level     Bathroom Shower/Tub: Chief Strategy Officer: Standard     Home Equipment:  Rolling Walker (2 wheels);Rollator (4 wheels)   Additional Comments: wears 2-3 L at home      Prior Functioning/Environment Prior Level of Function : Independent/Modified Independent                         OT Problem List: Decreased activity tolerance;Impaired balance (sitting and/or standing);Cardiopulmonary status limiting activity;Decreased knowledge of use of DME or AE      OT Treatment/Interventions: Self-care/ADL training;Therapeutic exercise;Energy conservation;Patient/family education;Therapeutic activities;Balance training    OT Goals(Current goals can be found in the care plan section) Acute Rehab OT Goals Patient Stated Goal: to go home OT Goal Formulation: With patient Time For Goal Achievement: 04/19/23 Potential to Achieve Goals: Fair  OT Frequency: Min 1X/week    Co-evaluation PT/OT/SLP Co-Evaluation/Treatment: Yes Reason for Co-Treatment: To address functional/ADL transfers PT goals addressed during session: Mobility/safety with mobility OT goals addressed during session: ADL's and self-care      AM-PAC OT "6 Clicks" Daily Activity     Outcome Measure Help from another person eating meals?: A Little Help from another person taking care of personal grooming?: A Little Help from another person toileting, which includes using toliet, bedpan, or urinal?: A Little Help from another person bathing (including washing, rinsing, drying)?: A Little Help from another person to put on and taking off regular upper body clothing?: A Little Help from another person to put on and taking off regular lower body clothing?: A Lot 6 Click Score: 17   End of Session Equipment Utilized During Treatment: Oxygen Nurse Communication: Mobility status  Activity Tolerance: Patient tolerated treatment well Patient left: in chair;with call bell/phone within reach  OT Visit Diagnosis: Unsteadiness on feet (R26.81);Other abnormalities of gait and mobility (R26.89)                Time: 1610-9604 OT Time Calculation (min): 12 min Charges:  OT General Charges $OT Visit: 1 Visit OT Evaluation $OT Eval Low Complexity: 1 Low  Karinna Beadles OTR/L, MS Acute Rehabilitation Department Office#  [phone]   Selinda Flavin 04/05/2023, 3:38 PM

## 2023-04-05 NOTE — Progress Notes (Signed)
   04/05/23 2345  BiPAP/CPAP/SIPAP  Reason BIPAP/CPAP not in use Non-compliant

## 2023-04-05 NOTE — Evaluation (Signed)
Physical Therapy Evaluation Patient Details Name: Brenda [name] MRN: [id number] DOB: October 31, 1960 Today's Date: 04/05/2023  History of Present Illness  62 y.o. female with medical history significant for coronary artery disease, chronic HFpEF, CKD 3A, COPD, former smoker, chronic hypoxic respiratory failure on 2 L nasal cannula, essential hypertension, type 2 diabetes, diabetic polyneuropathy, hyperlipidemia, GERD, severe morbid obesity, bipolar disorder/generalized anxiety disorder, who presented to the ED 04/03/23  from home due to worsening dyspnea with minimal exertion, coughing, and wheezing for the past 2 days.  No reported subjective fevers or chills.  Clinical Impression  Pt admitted with above diagnosis.  Pt currently with functional limitations due to the deficits listed below (see PT Problem List). Pt will benefit from acute skilled PT to increase their independence and safety with mobility to allow discharge.   The patient reports  feeling  poorly but willing to ambulate. Patient resting On 4 L, Spo2 97%, HR 91 Patient ambulated on 2 LPM, SPO2 89%, HR 112.   Patient reports independence PTA,  Lives with father, has access to RW if needed.        If plan is discharge home, recommend the following: A little help with bathing/dressing/bathroom;Help with stairs or ramp for entrance;Assistance with cooking/housework;Assist for transportation   Can travel by private vehicle        Equipment Recommendations None recommended by PT  Recommendations for Other Services       Functional Status Assessment Patient has had a recent decline in their functional status and demonstrates the ability to make significant improvements in function in a reasonable and predictable amount of time.     Precautions / Restrictions Precautions Precaution Comments: sats Restrictions Weight Bearing Restrictions: No      Mobility  Bed Mobility Overal bed mobility: Independent                   Transfers Overall transfer level: Needs assistance Equipment used: None Transfers: Sit to/from Stand Sit to Stand: Supervision                Ambulation/Gait Ambulation/Gait assistance: Contact guard assist Gait Distance (Feet): 80 Feet Assistive device: None Gait Pattern/deviations: Step-through pattern Gait velocity: decr     General Gait Details: mild LOB x 1 upon standing, provided CGA in hall  Stairs            Wheelchair Mobility     Tilt Bed    Modified Rankin (Stroke Patients Only)       Balance Overall balance assessment: Mild deficits observed, not formally tested                                           Pertinent Vitals/Pain Pain Assessment Pain Assessment: No/denies pain    Home Living Family/patient expects to be discharged to:: Private residence Living Arrangements: Parent Available Help at Discharge: Family;Available 24 hours/day Type of Home: House Home Access: Stairs to enter Entrance Stairs-Rails: Doctor, general practice of Steps: 2   Home Layout: One level Home Equipment: Agricultural consultant (2 wheels);Rollator (4 wheels) Additional Comments: weqrs 2-3 L    Prior Function Prior Level of Function : Independent/Modified Independent                     Extremity/Trunk Assessment        Lower Extremity Assessment Lower Extremity Assessment: Overall WFL for tasks  assessed    Cervical / Trunk Assessment Cervical / Trunk Assessment: Normal  Communication   Communication Communication: No apparent difficulties  Cognition Arousal: Alert Behavior During Therapy: WFL for tasks assessed/performed Overall Cognitive Status: Within Functional Limits for tasks assessed                                          General Comments      Exercises     Assessment/Plan    PT Assessment Patient needs continued PT services  PT Problem List Decreased strength;Decreased  mobility;Decreased safety awareness;Cardiopulmonary status limiting activity;Decreased balance;Decreased activity tolerance       PT Treatment Interventions Gait training;Functional mobility training;Therapeutic activities;Patient/family education    PT Goals (Current goals can be found in the Care Plan section)  Acute Rehab PT Goals Patient Stated Goal: go home PT Goal Formulation: With patient Time For Goal Achievement: 04/19/23 Potential to Achieve Goals: Good    Frequency Min 1X/week     Co-evaluation PT/OT/SLP Co-Evaluation/Treatment: Yes Reason for Co-Treatment: To address functional/ADL transfers PT goals addressed during session: Mobility/safety with mobility OT goals addressed during session: ADL's and self-care       AM-PAC PT "6 Clicks" Mobility  Outcome Measure Help needed turning from your back to your side while in a flat bed without using bedrails?: None Help needed moving from lying on your back to sitting on the side of a flat bed without using bedrails?: None Help needed moving to and from a bed to a chair (including a wheelchair)?: A Little Help needed standing up from a chair using your arms (e.g., wheelchair or bedside chair)?: A Little Help needed to walk in hospital room?: A Little Help needed climbing 3-5 steps with a railing? : A Lot 6 Click Score: 19    End of Session   Activity Tolerance: Treatment limited secondary to medical complications (Comment) Patient left: in chair Nurse Communication: Mobility status PT Visit Diagnosis: Unsteadiness on feet (R26.81);Difficulty in walking, not elsewhere classified (R26.2)    Time: 6045-4098 PT Time Calculation (min) (ACUTE ONLY): 11 min   Charges:   PT Evaluation $PT Eval Low Complexity: 1 Low   PT General Charges $$ ACUTE PT VISIT: 1 Visit         Blanchard Kelch PT Acute Rehabilitation Services Office [phone] Weekend pager-[phone]   Rada Hay 04/05/2023, 10:39 AM

## 2023-04-06 ENCOUNTER — Ambulatory Visit: Payer: 59 | Admitting: Cardiology

## 2023-04-06 DIAGNOSIS — J441 Chronic obstructive pulmonary disease with (acute) exacerbation: Secondary | ICD-10-CM | POA: Diagnosis not present

## 2023-04-06 LAB — GLUCOSE, CAPILLARY
Glucose-Capillary: 114 mg/dL — ABNORMAL HIGH (ref 70–99)
Glucose-Capillary: 246 mg/dL — ABNORMAL HIGH (ref 70–99)

## 2023-04-06 MED ORDER — IPRATROPIUM-ALBUTEROL 0.5-2.5 (3) MG/3ML IN SOLN
3.0000 mL | RESPIRATORY_TRACT | Status: DC
  Administered 2023-04-06 – 2023-04-09 (×16): 3 mL via RESPIRATORY_TRACT
  Filled 2023-04-06 (×16): qty 3

## 2023-04-06 MED ORDER — IPRATROPIUM-ALBUTEROL 0.5-2.5 (3) MG/3ML IN SOLN
3.0000 mL | Freq: Four times a day (QID) | RESPIRATORY_TRACT | Status: DC
  Administered 2023-04-06: 3 mL via RESPIRATORY_TRACT
  Filled 2023-04-06: qty 3

## 2023-04-06 NOTE — Progress Notes (Signed)
Pt transferred to 1511 with all her belongings, no c/o at this time, all needs met. Report called to [employer], Charity fundraiser.

## 2023-04-06 NOTE — Progress Notes (Signed)
Mobility Specialist - Progress Note   04/06/23 0900  Mobility  Activity Ambulated with assistance in hallway  Level of Assistance Standby assist, set-up cues, supervision of patient - no hands on  Assistive Device None  Distance Ambulated (ft) 90 ft  Range of Motion/Exercises Active  Activity Response Tolerated well  Mobility Referral Yes  $Mobility charge 1 Mobility  Mobility Specialist Start Time (ACUTE ONLY) 0914  Mobility Specialist Stop Time (ACUTE ONLY) [id number]  Mobility Specialist Time Calculation (min) (ACUTE ONLY) 8 min   Received in bed and agreed to mobility, some c/o of SOB. Pt O2 saturation was at 93% for ambulation. HR was at 126. Upon returning to room, saturation returned to 95%, and HR returned to 102.  Left in bed with all needs met.  Marilynne Halsted Mobility Specialist

## 2023-04-06 NOTE — Progress Notes (Signed)
PROGRESS NOTE    Brenda [name]  VOZ:[id number] DOB: Oct 30, 1960 DOA: 04/03/2023 PCP: [name], MD   Brief Narrative: 62 y.o. female with medical history significant for coronary artery disease, chronic HFpEF, CKD 3A, COPD, former smoker, chronic hypoxic respiratory failure on 2 L nasal cannula, essential hypertension, type 2 diabetes, diabetic polyneuropathy, hyperlipidemia, GERD, severe morbid obesity, bipolar disorder/generalized anxiety disorder, admitted with worsening dyspnea coughing and wheezing for 2 days prior to admission.  Patient reports she has oxygen at home she uses continuously at 2 L.  In the ER she was hypoxic she required 4 L of oxygen to maintain her saturation above 92%.  She was tachypneic afebrile and hypoxic 95% on 4 L in the ER.    Assessment & Plan:   Principal Problem:   COPD with acute exacerbation (HCC) Active Problems:   Chronic hypoxic respiratory failure (HCC)   COPD exacerbation (HCC)   HYPERTRIGLYCERIDEMIA   Morbid obesity with BMI of 50.0-59.9, adult (HCC)   Leukocytosis   Ex-smoker   Essential hypertension   OSA on CPAP   Diastolic dysfunction   Unilateral primary osteoarthritis, left knee   Knee mass, right   COPD with acute exacerbation, unclear trigger-she is a former smoker admitted with wheezing shortness of breath and cough.  She has been started on azithromycin nebs and steroids which seems to be working but very slow improvement. Out of bed ambulate Hopefully home in 24 to 48 hours   Acute on chronic hypoxic respiratory failure At baseline on 2 L nasal cannula Still requiring 4 L of oxygen Titrate oxygen to keep sats above 92%   Leukocytosis, improving suspect reactive in the setting of steroid use, COPD exacerbation  IV Solu-Medrol 40 mg daily Continue IV azithromycin    Type 2 diabetes with hyperglycemia Last hemoglobin A1c 6.4 on 10/20/2022 CBG (last 3)  Recent Labs    04/05/23 0715 04/05/23 1722 04/06/23 0750   GLUCAP 114* 182* 114*    Not on insulin prior to admission Monitor with CBGs twice daily   HFpEF Euvolemic on exam.  Continue and resto and Aldactone.   Hyperlipidemia Resume home regimen   Hypertension on Entresto and aldactone  GERD Resume home regimen   Bipolar disorder/chronic anxiety Resume home regimen abilify lexapro   CKD 3A Currently at baseline Avoid nephrotoxic agents and hypotension   Obesity Weight 122 kg Recommend weight loss outpatient with regular physical activity and healthy dieting.  ?  Undiagnosed sleep apnea not on CPAP at home will need a sleep study as an outpatient  Estimated body mass index is 53.95 kg/m as calculated from the following:   Height as of this encounter: 5' (1.524 m).   Weight as of this encounter: 125.3 kg.  DVT prophylaxis: lovenox Code Status: full Family Communication:none Disposition Plan:  Status is: Inpatient Remains inpatient appropriate because: acute copd ex   Consultants: none  Procedures:none  Antimicrobials:azithromycin Subjective:  She is still wheezing and coughing does not feel she is ready to go home   objective: Vitals:   04/06/23 0512 04/06/23 0513 04/06/23 0825 04/06/23 1313  BP:  (!) 134/56  (!) 146/83  Pulse:  80  80  Resp:  18  18  Temp:  98.2 F (36.8 C)  98.3 F (36.8 C)  TempSrc:  Oral  Oral  SpO2:  98% 96% 91%  Weight: 125.3 kg     Height:        Intake/Output Summary (Last 24 hours) at 04/06/2023 1341 Last  data filed at 04/06/2023 0930 Gross per 24 hour  Intake 600 ml  Output 550 ml  Net 50 ml   Filed Weights   04/04/23 1448 04/06/23 0512  Weight: 123.9 kg 125.3 kg    Examination:  General exam: Appears no acute distress Respiratory system: Diffuse bilateral wheezing to auscultation.  Cardiovascular system: S1 & S2 heard, RRR. No JVD, murmurs, rubs, gallops or clicks. No pedal edema. Gastrointestinal system: Abdomen is nondistended, soft and nontender. No organomegaly  or masses felt. Normal bowel sounds heard. Central nervous system: Alert and oriented. No focal neurological deficits. Extremities: trace edema     Data Reviewed: I have personally reviewed following labs and imaging studies  CBC: Recent Labs  Lab 04/03/23 2046 04/04/23 0500  WBC 15.3* 14.7*  HGB 13.5 13.5  HCT 43.5 43.1  MCV 95.8 93.9  PLT 275 262   Basic Metabolic Panel: Recent Labs  Lab 04/03/23 2135 04/04/23 0500  NA 140 139  K 3.7 4.4  CL 102 105  CO2 29 28  GLUCOSE 132* 162*  BUN 14 14  CREATININE 1.14* 0.97  CALCIUM 8.6* 8.8*  MG  --  2.7*  PHOS  --  2.8   GFR: Estimated Creatinine Clearance: 71.6 mL/min (by C-G formula based on SCr of 0.97 mg/dL). Liver Function Tests: No results for input(s): "AST", "ALT", "ALKPHOS", "BILITOT", "PROT", "ALBUMIN" in the last 168 hours. No results for input(s): "LIPASE", "AMYLASE" in the last 168 hours. No results for input(s): "AMMONIA" in the last 168 hours. Coagulation Profile: No results for input(s): "INR", "PROTIME" in the last 168 hours. Cardiac Enzymes: No results for input(s): "CKTOTAL", "CKMB", "CKMBINDEX", "TROPONINI" in the last 168 hours. BNP (last 3 results) Recent Labs    10/14/22 1619 01/14/23 1036  PROBNP 19.0 34.0   HbA1C: No results for input(s): "HGBA1C" in the last 72 hours. CBG: Recent Labs  Lab 04/04/23 0935 04/05/23 0715 04/05/23 1722 04/06/23 0750  GLUCAP 154* 114* 182* 114*   Lipid Profile: No results for input(s): "CHOL", "HDL", "LDLCALC", "TRIG", "CHOLHDL", "LDLDIRECT" in the last 72 hours. Thyroid Function Tests: No results for input(s): "TSH", "T4TOTAL", "FREET4", "T3FREE", "THYROIDAB" in the last 72 hours. Anemia Panel: No results for input(s): "VITAMINB12", "FOLATE", "FERRITIN", "TIBC", "IRON", "RETICCTPCT" in the last 72 hours. Sepsis Labs: Recent Labs  Lab 04/04/23 0500  PROCALCITON <0.10    No results found for this or any previous visit (from the past 240 hour(s)).        Radiology Studies: No results found.   Scheduled Meds:  ARIPiprazole  10 mg Oral Daily   aspirin EC  81 mg Oral Daily   atorvastatin  80 mg Oral Daily   azithromycin  500 mg Oral Daily   enoxaparin (LOVENOX) injection  40 mg Subcutaneous Q24H   escitalopram  20 mg Oral Daily   famotidine  20 mg Oral Daily   gabapentin  300 mg Oral Daily   And   gabapentin  300 mg Oral QHS   ipratropium-albuterol  3 mL Nebulization Q6H   methylPREDNISolone (SOLU-MEDROL) injection  40 mg Intravenous Daily   mometasone-formoterol  2 puff Inhalation BID   multivitamin with minerals  1 tablet Oral Daily   pantoprazole  40 mg Oral QHS   sacubitril-valsartan  1 tablet Oral BID   spironolactone  25 mg Oral Daily   umeclidinium bromide  1 puff Inhalation Daily   Continuous Infusions:     LOS: 3 days    Time spent: 39 min  Alwyn Ren, MD 04/06/2023, 1:41 PM

## 2023-04-06 NOTE — TOC CM/SW Note (Signed)
Transition of Care [facility]) - Inpatient Brief Assessment   Patient Details  Name: Brenda [name] MRN: [id number] Date of Birth: 20-Feb-1961  [facility]) CM/SW Contact:    Darleene Cleaver, LCSW Phone Number: 04/06/2023, 7:43 PM   Clinical Narrative:  Patient does not have any SDOH needs.  Patient is on 2L chronic oxygen, she has a rolling walker at home, and lives indep.  No anticipated TOC needs.  Transition of Care Asessment: Insurance and Status: Insurance coverage has been reviewed Patient has primary care physician: Yes Home environment has been reviewed: Yes Prior level of function:: Indep Prior/Current Home Services: No current home services Social Determinants of Health Reivew: SDOH reviewed no interventions necessary Readmission risk has been reviewed: Yes Transition of care needs: no transition of care needs at this time

## 2023-04-07 DIAGNOSIS — D72829 Elevated white blood cell count, unspecified: Secondary | ICD-10-CM

## 2023-04-07 DIAGNOSIS — I5189 Other ill-defined heart diseases: Secondary | ICD-10-CM

## 2023-04-07 DIAGNOSIS — I10 Essential (primary) hypertension: Secondary | ICD-10-CM

## 2023-04-07 DIAGNOSIS — E781 Pure hyperglyceridemia: Secondary | ICD-10-CM

## 2023-04-07 DIAGNOSIS — J9611 Chronic respiratory failure with hypoxia: Secondary | ICD-10-CM

## 2023-04-07 DIAGNOSIS — J9621 Acute and chronic respiratory failure with hypoxia: Secondary | ICD-10-CM

## 2023-04-07 DIAGNOSIS — J441 Chronic obstructive pulmonary disease with (acute) exacerbation: Secondary | ICD-10-CM | POA: Diagnosis not present

## 2023-04-07 DIAGNOSIS — G4733 Obstructive sleep apnea (adult) (pediatric): Secondary | ICD-10-CM

## 2023-04-07 DIAGNOSIS — Z6841 Body Mass Index (BMI) 40.0 and over, adult: Secondary | ICD-10-CM

## 2023-04-07 DIAGNOSIS — E119 Type 2 diabetes mellitus without complications: Secondary | ICD-10-CM

## 2023-04-07 LAB — COMPREHENSIVE METABOLIC PANEL
ALT: 21 U/L (ref 0–44)
AST: 16 U/L (ref 15–41)
Albumin: 3.4 g/dL — ABNORMAL LOW (ref 3.5–5.0)
Alkaline Phosphatase: 98 U/L (ref 38–126)
Anion gap: 10 (ref 5–15)
BUN: 20 mg/dL (ref 8–23)
CO2: 30 mmol/L (ref 22–32)
Calcium: 8.9 mg/dL (ref 8.9–10.3)
Chloride: 101 mmol/L (ref 98–111)
Creatinine, Ser: 0.89 mg/dL (ref 0.44–1.00)
GFR, Estimated: >60 mL/min (ref >60)
Glucose, Bld: 130 mg/dL — ABNORMAL HIGH (ref 70–99)
Potassium: 4 mmol/L (ref 3.5–5.1)
Sodium: 141 mmol/L (ref 135–145)
Total Bilirubin: 0.5 mg/dL (ref <1.2)
Total Protein: 7.2 g/dL (ref 6.5–8.1)

## 2023-04-07 LAB — CBC
HCT: 44.3 % (ref 36.0–46.0)
Hemoglobin: 13.4 g/dL (ref 12.0–15.0)
MCH: 29.2 pg (ref 26.0–34.0)
MCHC: 30.2 g/dL (ref 30.0–36.0)
MCV: 96.5 fL (ref 80.0–100.0)
Platelets: 239 10*3/uL (ref 150–400)
RBC: 4.59 MIL/uL (ref 3.87–5.11)
RDW: 16.2 % — ABNORMAL HIGH (ref 11.5–15.5)
WBC: 16.8 10*3/uL — ABNORMAL HIGH (ref 4.0–10.5)
nRBC: 0 % (ref 0.0–0.2)

## 2023-04-07 LAB — RESPIRATORY PANEL BY PCR

## 2023-04-07 LAB — RESP PANEL BY RT-PCR (RSV, FLU A&B, COVID)  RVPGX2
Influenza A by PCR: NEGATIVE
Influenza B by PCR: NEGATIVE
Resp Syncytial Virus by PCR: NEGATIVE
SARS Coronavirus 2 by RT PCR: NEGATIVE

## 2023-04-07 LAB — GLUCOSE, CAPILLARY
Glucose-Capillary: 115 mg/dL — ABNORMAL HIGH (ref 70–99)
Glucose-Capillary: 199 mg/dL — ABNORMAL HIGH (ref 70–99)
Glucose-Capillary: 250 mg/dL — ABNORMAL HIGH (ref 70–99)
Glucose-Capillary: 163 mg/dL — ABNORMAL HIGH (ref 70–99)

## 2023-04-07 MED ORDER — LORAZEPAM 0.5 MG PO TABS: 0.5000 mg | ORAL_TABLET | Freq: Two times a day (BID) | ORAL | Status: DC | PRN

## 2023-04-07 MED ORDER — VITAMIN D 25 MCG (1000 UNIT) PO TABS
1000.0000 [IU] | ORAL_TABLET | Freq: Every day | ORAL | Status: DC
  Administered 2023-04-08 – 2023-04-12 (×5): 1000 [IU] via ORAL
  Filled 2023-04-07 (×5): qty 1

## 2023-04-07 MED ORDER — BUDESONIDE 0.5 MG/2ML IN SUSP
0.5000 mg | Freq: Two times a day (BID) | RESPIRATORY_TRACT | Status: DC
  Administered 2023-04-07 – 2023-04-12 (×11): 0.5 mg via RESPIRATORY_TRACT
  Filled 2023-04-07 (×11): qty 2

## 2023-04-07 MED ORDER — LORATADINE 10 MG PO TABS
10.0000 mg | ORAL_TABLET | Freq: Every day | ORAL | Status: DC
  Administered 2023-04-07 – 2023-04-12 (×6): 10 mg via ORAL
  Filled 2023-04-07 (×6): qty 1

## 2023-04-07 MED ORDER — FLUTICASONE PROPIONATE 50 MCG/ACT NA SUSP
2.0000 | Freq: Every day | NASAL | Status: DC
  Administered 2023-04-07 – 2023-04-12 (×6): 2 via NASAL
  Filled 2023-04-07: qty 16

## 2023-04-07 MED ORDER — INSULIN ASPART 100 UNIT/ML IJ SOLN
0.0000 [IU] | Freq: Three times a day (TID) | INTRAMUSCULAR | Status: DC
  Administered 2023-04-08 (×3): 7 [IU] via SUBCUTANEOUS
  Administered 2023-04-09: 4 [IU] via SUBCUTANEOUS
  Administered 2023-04-09: 7 [IU] via SUBCUTANEOUS
  Administered 2023-04-09: 11 [IU] via SUBCUTANEOUS
  Administered 2023-04-10: 7 [IU] via SUBCUTANEOUS
  Administered 2023-04-10 – 2023-04-11 (×4): 11 [IU] via SUBCUTANEOUS
  Administered 2023-04-11: 7 [IU] via SUBCUTANEOUS
  Administered 2023-04-12 (×2): 3 [IU] via SUBCUTANEOUS

## 2023-04-07 MED ORDER — INSULIN ASPART 100 UNIT/ML IJ SOLN
0.0000 [IU] | Freq: Every day | INTRAMUSCULAR | Status: DC
  Administered 2023-04-08 – 2023-04-10 (×3): 2 [IU] via SUBCUTANEOUS

## 2023-04-07 MED ORDER — METHYLPREDNISOLONE SODIUM SUCC 125 MG IJ SOLR
90.0000 mg | Freq: Two times a day (BID) | INTRAMUSCULAR | Status: DC
  Administered 2023-04-07 – 2023-04-09 (×4): 90 mg via INTRAVENOUS
  Filled 2023-04-07 (×4): qty 2

## 2023-04-07 MED ORDER — ARFORMOTEROL TARTRATE 15 MCG/2ML IN NEBU
15.0000 ug | INHALATION_SOLUTION | Freq: Two times a day (BID) | RESPIRATORY_TRACT | Status: DC
  Administered 2023-04-07 – 2023-04-12 (×11): 15 ug via RESPIRATORY_TRACT
  Filled 2023-04-07 (×11): qty 2

## 2023-04-07 NOTE — Progress Notes (Signed)
PROGRESS NOTE    Brenda [name]  WUJ:[id number] DOB: 06-18-1960 DOA: 04/03/2023 PCP: [name], MD    Chief Complaint  Patient presents with   Shortness of Breath    Brief Narrative:  62 y.o. female with medical history significant for coronary artery disease, chronic HFpEF, CKD 3A, COPD, former smoker, chronic hypoxic respiratory failure on 2 L nasal cannula, essential hypertension, type 2 diabetes, diabetic polyneuropathy, hyperlipidemia, GERD, severe morbid obesity, bipolar disorder/generalized anxiety disorder, admitted with worsening dyspnea coughing and wheezing for 2 days prior to admission.  Patient reports she has oxygen at home she uses continuously at 2 L.  In the ER she was hypoxic she required 4 L of oxygen to maintain her saturation above 92%.  She was tachypneic afebrile and hypoxic 95% on 4 L in the ER.     Assessment & Plan:   Principal Problem:   COPD with acute exacerbation (HCC) Active Problems:   Chronic hypoxic respiratory failure (HCC)   COPD exacerbation (HCC)   HYPERTRIGLYCERIDEMIA   Morbid obesity with BMI of 50.0-59.9, adult (HCC)   Leukocytosis   Ex-smoker   Essential hypertension   OSA on CPAP   Type 2 diabetes mellitus without complication (HCC)   Diastolic dysfunction   Unilateral primary osteoarthritis, left knee   Knee mass, right  #1 acute on chronic hypoxic respiratory failure secondary to acute COPD exacerbation -Patient presented with wheezing, shortness of breath, productive cough and increased O2 requirements requiring 4 L O2 with baseline O2 of 2 L nasal cannula. -Patient still with complaints of diffuse wheezing and shortness of breath with no significant improvement over the past 24 hours. -Check a SARS coronavirus 2 PCR. -Check a respiratory viral panel. -Place on Brovana nebs, Pulmicort nebs, Flonase, Claritin. -Continue PPI, continue scheduled DuoNebs. -Increase IV Solu-Medrol to 60 mg every 8 hours. -Repeat chest x-ray in  the morning. -Supportive care.  2.  Leukocytosis -Likely secondary to steroids. -Patient afebrile. -Follow-up.  3.  Type 2 diabetes with hyperglycemia -Hemoglobin A1c 6.4 (10/20/2022) -Repeat hemoglobin A1c. -CBG 115 this morning. -Trulicity prior to admission. -Patient currently on IV steroids. -Placed on SSI. -May need basal insulin however will monitor for now.  4.  HFpEF Currently euvolemic and stable on exam. -Continue home regimen aspirin, statin, Entresto, spironolactone. -BP somewhat borderline and as such we will continue to hold home regimen Lasix however could likely resume in the next 24 hours. -Continue to hold home regimen Toprol-XL.  5.  Hyperlipidemia -Statin.  6.  GERD -PPI.  7.  GAD/bipolar disorder -Continue home regimen Abilify, Lexapro.  8.  CKD stage IIIa -Stable.  9.  Obesity -BMI of 53.95 kg/m -Lifestyle modification -Outpatient follow-up with PCP.  10.  OSA -CPAP nightly.   DVT prophylaxis: Lovenox Code Status: Full Family Communication: Updated patient.  No family at bedside. Disposition: Likely home when clinically improved.  Status is: Inpatient Remains inpatient appropriate because: Severity of illness   Consultants:  None  Procedures:  Chest x-ray 04/03/2023   Antimicrobials:  Anti-infectives (From admission, onward)    Start     Dose/Rate Route Frequency Ordered Stop   04/05/23 0000  azithromycin (ZITHROMAX) tablet 500 mg        500 mg Oral Daily 04/04/23 1133     04/04/23 0000  azithromycin (ZITHROMAX) 500 mg in dextrose 5 % 250 mL IVPB  Status:  Discontinued        500 mg 250 mL/hr over 60 Minutes Intravenous Every 24 hours 04/03/23  2350 04/04/23 1133         Subjective: Patient laying in bed states no significant improvement with shortness of breath and wheezing and cough.  States has a productive frothy brownish sputum.  Objective: Vitals:   04/07/23 0500 04/07/23 0617 04/07/23 0819 04/07/23 1032  BP:   130/76  (!) 109/58  Pulse:  76  85  Resp:      Temp:  98 F (36.7 C)  98.3 F (36.8 C)  TempSrc:  Oral  Oral  SpO2:  99% 99% 97%  Weight: 124.9 kg     Height:        Intake/Output Summary (Last 24 hours) at 04/07/2023 1930 Last data filed at 04/07/2023 0500 Gross per 24 hour  Intake 400 ml  Output --  Net 400 ml   Filed Weights   04/04/23 1448 04/06/23 0512 04/07/23 0500  Weight: 123.9 kg 125.3 kg 124.9 kg    Examination:  General exam: Appears calm and comfortable  Respiratory system: Diffuse expiratory wheezing.  Some scattered coarse breath sounds.  No significant crackles noted.  Fair air movement.  Speaking in full sentences.  Cardiovascular system: S1 & S2 heard, RRR. No JVD, murmurs, rubs, gallops or clicks. No pedal edema. Gastrointestinal system: Abdomen is nondistended, soft and nontender. No organomegaly or masses felt. Normal bowel sounds heard. Central nervous system: Alert and oriented. No focal neurological deficits. Extremities: Symmetric 5 x 5 power. Skin: No rashes, lesions or ulcers Psychiatry: Judgement and insight appear normal. Mood & affect appropriate.     Data Reviewed: I have personally reviewed following labs and imaging studies  CBC: Recent Labs  Lab 04/03/23 2046 04/04/23 0500 04/07/23 0511  WBC 15.3* 14.7* 16.8*  HGB 13.5 13.5 13.4  HCT 43.5 43.1 44.3  MCV 95.8 93.9 96.5  PLT 275 262 239    Basic Metabolic Panel: Recent Labs  Lab 04/03/23 2135 04/04/23 0500 04/07/23 0511  NA 140 139 141  K 3.7 4.4 4.0  CL 102 105 101  CO2 29 28 30   GLUCOSE 132* 162* 130*  BUN 14 14 20   CREATININE 1.14* 0.97 0.89  CALCIUM 8.6* 8.8* 8.9  MG  --  2.7*  --   PHOS  --  2.8  --     GFR: Estimated Creatinine Clearance: 77.9 mL/min (by C-G formula based on SCr of 0.89 mg/dL).  Liver Function Tests: Recent Labs  Lab 04/07/23 0511  AST 16  ALT 21  ALKPHOS 98  BILITOT 0.5  PROT 7.2  ALBUMIN 3.4*    CBG: Recent Labs  Lab  04/06/23 0750 04/06/23 1807 04/07/23 0741 04/07/23 1158 04/07/23 1904  GLUCAP 114* 246* 115* 199* 250*     Recent Results (from the past 240 hour(s))  Resp panel by RT-PCR (RSV, Flu A&B, Covid) Anterior Nasal Swab     Status: None   Collection Time: 04/07/23 12:49 PM   Specimen: Anterior Nasal Swab  Result Value Ref Range Status   SARS Coronavirus 2 by RT PCR NEGATIVE NEGATIVE Final    Comment: (NOTE) SARS-CoV-2 target nucleic acids are NOT DETECTED.  The SARS-CoV-2 RNA is generally detectable in upper respiratory specimens during the acute phase of infection. The lowest concentration of SARS-CoV-2 viral copies this assay can detect is 138 copies/mL. A negative result does not preclude SARS-Cov-2 infection and should not be used as the sole basis for treatment or other patient management decisions. A negative result may occur with  improper specimen collection/handling, submission of specimen other  than nasopharyngeal swab, presence of viral mutation(s) within the areas targeted by this assay, and inadequate number of viral copies(<138 copies/mL). A negative result must be combined with clinical observations, patient history, and epidemiological information. The expected result is Negative.  Fact Sheet for Patients:  BloggerCourse.com  Fact Sheet for Healthcare Providers:  SeriousBroker.it  This test is no t yet approved or cleared by the [place] FDA and  has been authorized for detection and/or diagnosis of SARS-CoV-2 by FDA under an Emergency Use Authorization (EUA). This EUA will remain  in effect (meaning this test can be used) for the duration of the COVID-19 declaration under Section 564(b)(1) of the Act, 21 U.S.C.section 360bbb-3(b)(1), unless the authorization is terminated  or revoked sooner.       Influenza A by PCR NEGATIVE NEGATIVE Final   Influenza B by PCR NEGATIVE NEGATIVE Final    Comment:  (NOTE) The Xpert Xpress SARS-CoV-2/FLU/RSV plus assay is intended as an aid in the diagnosis of influenza from Nasopharyngeal swab specimens and should not be used as a sole basis for treatment. Nasal washings and aspirates are unacceptable for Xpert Xpress SARS-CoV-2/FLU/RSV testing.  Fact Sheet for Patients: BloggerCourse.com  Fact Sheet for Healthcare Providers: SeriousBroker.it  This test is not yet approved or cleared by the [place] FDA and has been authorized for detection and/or diagnosis of SARS-CoV-2 by FDA under an Emergency Use Authorization (EUA). This EUA will remain in effect (meaning this test can be used) for the duration of the COVID-19 declaration under Section 564(b)(1) of the Act, 21 U.S.C. section 360bbb-3(b)(1), unless the authorization is terminated or revoked.     Resp Syncytial Virus by PCR NEGATIVE NEGATIVE Final    Comment: (NOTE) Fact Sheet for Patients: BloggerCourse.com  Fact Sheet for Healthcare Providers: SeriousBroker.it  This test is not yet approved or cleared by the [place] FDA and has been authorized for detection and/or diagnosis of SARS-CoV-2 by FDA under an Emergency Use Authorization (EUA). This EUA will remain in effect (meaning this test can be used) for the duration of the COVID-19 declaration under Section 564(b)(1) of the Act, 21 U.S.C. section 360bbb-3(b)(1), unless the authorization is terminated or revoked.  Performed at [facility], [address], [place], [place] [address]          Radiology Studies: No results found.      Scheduled Meds:  arformoterol  15 mcg Nebulization BID   ARIPiprazole  10 mg Oral Daily   aspirin EC  81 mg Oral Daily   atorvastatin  80 mg Oral Daily   azithromycin  500 mg Oral Daily   budesonide (PULMICORT) nebulizer solution  0.5 mg Nebulization BID    enoxaparin (LOVENOX) injection  40 mg Subcutaneous Q24H   escitalopram  20 mg Oral Daily   famotidine  20 mg Oral Daily   fluticasone  2 spray Each Nare Daily   gabapentin  300 mg Oral Daily   And   gabapentin  300 mg Oral QHS   ipratropium-albuterol  3 mL Nebulization Q4H   loratadine  10 mg Oral Daily   methylPREDNISolone (SOLU-MEDROL) injection  90 mg Intravenous Q12H   multivitamin with minerals  1 tablet Oral Daily   pantoprazole  40 mg Oral QHS   sacubitril-valsartan  1 tablet Oral BID   spironolactone  25 mg Oral Daily   umeclidinium bromide  1 puff Inhalation Daily   Continuous Infusions:   LOS: 4 days    Time spent: 40  minutes    Ramiro Harvest, MD [facility]   To contact the attending provider between 7A-7P or the covering provider during after hours 7P-7A, please log into the web site [email or web address] and access using universal [place] password for that web site. If you do not have the password, please call the hospital operator.  04/07/2023, 7:30 PM

## 2023-04-07 NOTE — Plan of Care (Signed)

## 2023-04-07 NOTE — Plan of Care (Signed)
  Problem: Education: Goal: Knowledge of General Education information will improve Description: Including pain rating scale, medication(s)/side effects and non-pharmacologic comfort measures Outcome: Progressing   Problem: Health Behavior/Discharge Planning: Goal: Ability to manage health-related needs will improve Outcome: Progressing   Problem: Clinical Measurements: Goal: Ability to maintain clinical measurements within normal limits will improve Outcome: Progressing Goal: Will remain free from infection Outcome: Progressing Goal: Diagnostic test results will improve Outcome: Progressing Goal: Respiratory complications will improve Outcome: Progressing Goal: Cardiovascular complication will be avoided Outcome: Progressing   Problem: Activity: Goal: Risk for activity intolerance will decrease Outcome: Progressing   Problem: Nutrition: Goal: Adequate nutrition will be maintained Outcome: Progressing   Problem: Coping: Goal: Level of anxiety will decrease Outcome: Progressing   Problem: Elimination: Goal: Will not experience complications related to bowel motility Outcome: Progressing Goal: Will not experience complications related to urinary retention Outcome: Progressing   Problem: Pain Management: Goal: General experience of comfort will improve Outcome: Progressing   Problem: Safety: Goal: Ability to remain free from injury will improve Outcome: Progressing   Problem: Skin Integrity: Goal: Risk for impaired skin integrity will decrease Outcome: Progressing   Problem: Education: Goal: Knowledge of disease or condition will improve Outcome: Progressing Goal: Knowledge of the prescribed therapeutic regimen will improve Outcome: Progressing Goal: Individualized Educational Video(s) Outcome: Progressing   Problem: Activity: Goal: Ability to tolerate increased activity will improve Outcome: Progressing Goal: Will verbalize the importance of balancing  activity with adequate rest periods Outcome: Progressing   Problem: Respiratory: Goal: Ability to maintain a clear airway will improve Outcome: Progressing Goal: Levels of oxygenation will improve Outcome: Progressing Goal: Ability to maintain adequate ventilation will improve Outcome: Progressing   Problem: Education: Goal: Ability to describe self-care measures that may prevent or decrease complications (Diabetes Survival Skills Education) will improve Outcome: Progressing Goal: Individualized Educational Video(s) Outcome: Progressing   Problem: Coping: Goal: Ability to adjust to condition or change in health will improve Outcome: Progressing   Problem: Fluid Volume: Goal: Ability to maintain a balanced intake and output will improve Outcome: Progressing   Problem: Health Behavior/Discharge Planning: Goal: Ability to identify and utilize available resources and services will improve Outcome: Progressing Goal: Ability to manage health-related needs will improve Outcome: Progressing   Problem: Metabolic: Goal: Ability to maintain appropriate glucose levels will improve Outcome: Progressing   Problem: Nutritional: Goal: Maintenance of adequate nutrition will improve Outcome: Progressing Goal: Progress toward achieving an optimal weight will improve Outcome: Progressing   Problem: Skin Integrity: Goal: Risk for impaired skin integrity will decrease Outcome: Progressing   Problem: Tissue Perfusion: Goal: Adequacy of tissue perfusion will improve Outcome: Progressing

## 2023-04-07 NOTE — Progress Notes (Signed)
   04/07/23 2007  BiPAP/CPAP/SIPAP  Reason BIPAP/CPAP not in use Non-compliant  BiPAP/CPAP /SiPAP Vitals  Resp 15  MEWS Score/Color  MEWS Score 0  MEWS Score Color [place]

## 2023-04-07 NOTE — Plan of Care (Signed)

## 2023-04-07 NOTE — Progress Notes (Signed)
Physical Therapy Treatment Patient Details Name: Brenda [name] MRN: [id number] DOB: 01/21/1961 Today's Date: 04/07/2023   History of Present Illness Patient is a 62 year old female who presented to the ED 04/03/23  from home due to worsening dyspnea with minimal exertion, coughing, and wheezing for the past 2 days.  No reported subjective fevers or chills.PMH: coronary artery disease, chronic HFpEF, CKD 3A, COPD, former smoker, chronic hypoxic respiratory failure on 2 L nasal cannula, essential hypertension, type 2 diabetes, diabetic polyneuropathy, hyperlipidemia, GERD, severe morbid obesity, bipolar disorder/generalized anxiety disorder,    PT Comments  Patient appeared  more dyspneic with ambulation this visit. Patient reports the same. Patient ambulated ~ 13' on 2 LPM, SPo2 92%. Patient may benefit from HHPT and eventually Possibly OP Pulmonary rehab.    If plan is discharge home, recommend the following: A little help with bathing/dressing/bathroom;Help with stairs or ramp for entrance;Assistance with cooking/housework;Assist for transportation;A little help with walking and/or transfers   Can travel by private vehicle        Equipment Recommendations  None recommended by PT    Recommendations for Other Services       Precautions / Restrictions Precautions Precaution Comments: monitor O2, on 2 L     Mobility  Bed Mobility Overal bed mobility: Independent                  Transfers Overall transfer level: Independent                      Ambulation/Gait Ambulation/Gait assistance: Contact guard assist Gait Distance (Feet): 80 Feet Assistive device: None Gait Pattern/deviations: Step-through pattern Gait velocity: decr     General Gait Details: mild LOB x 1, provided CGA in hall , noted 4/4 dyspnea, SPO2 92% on 2 L   Stairs             Wheelchair Mobility     Tilt Bed    Modified Rankin (Stroke Patients Only)       Balance Overall  balance assessment: Mild deficits observed, not formally tested                                          Cognition Arousal: Alert Behavior During Therapy: Anxious Overall Cognitive Status: Within Functional Limits for tasks assessed                                          Exercises      General Comments        Pertinent Vitals/Pain Pain Assessment Pain Assessment: No/denies pain    Home Living                          Prior Function            PT Goals (current goals can now be found in the care plan section) Progress towards PT goals: Progressing toward goals    Frequency    Min 1X/week      PT Plan      Co-evaluation              AM-PAC PT "6 Clicks" Mobility   Outcome Measure  Help needed turning from your back to your side while in a  flat bed without using bedrails?: None Help needed moving from lying on your back to sitting on the side of a flat bed without using bedrails?: None Help needed moving to and from a bed to a chair (including a wheelchair)?: None Help needed standing up from a chair using your arms (e.g., wheelchair or bedside chair)?: A Little Help needed to walk in hospital room?: A Little Help needed climbing 3-5 steps with a railing? : A Lot 6 Click Score: 20    End of Session   Activity Tolerance: Treatment limited secondary to medical complications (Comment) Patient left: in bed;with call bell/phone within reach;with bed alarm set Nurse Communication: Mobility status PT Visit Diagnosis: Unsteadiness on feet (R26.81);Difficulty in walking, not elsewhere classified (R26.2)     Time: 1610-9604 PT Time Calculation (min) (ACUTE ONLY): 11 min  Charges:    $Gait Training: 8-22 mins PT General Charges $$ ACUTE PT VISIT: 1 Visit                     Blanchard Kelch PT Acute Rehabilitation Services Office [phone] Weekend pager-[phone]    Rada Hay 04/07/2023,  2:14 PM

## 2023-04-08 ENCOUNTER — Ambulatory Visit (HOSPITAL_COMMUNITY): Admission: RE | Admit: 2023-04-08 | Payer: 59 | Source: Ambulatory Visit

## 2023-04-08 ENCOUNTER — Inpatient Hospital Stay (HOSPITAL_COMMUNITY): Payer: 59

## 2023-04-08 DIAGNOSIS — I10 Essential (primary) hypertension: Secondary | ICD-10-CM | POA: Diagnosis not present

## 2023-04-08 DIAGNOSIS — J9611 Chronic respiratory failure with hypoxia: Secondary | ICD-10-CM | POA: Diagnosis not present

## 2023-04-08 DIAGNOSIS — D72829 Elevated white blood cell count, unspecified: Secondary | ICD-10-CM | POA: Diagnosis not present

## 2023-04-08 DIAGNOSIS — J441 Chronic obstructive pulmonary disease with (acute) exacerbation: Secondary | ICD-10-CM | POA: Diagnosis not present

## 2023-04-08 LAB — CBC WITH DIFFERENTIAL/PLATELET
Abs Immature Granulocytes: 0.18 10*3/uL — ABNORMAL HIGH (ref 0.00–0.07)
Basophils Absolute: 0.1 10*3/uL (ref 0.0–0.1)
Basophils Relative: 0 %
Eosinophils Absolute: 0 10*3/uL (ref 0.0–0.5)
Eosinophils Relative: 0 %
HCT: 44.6 % (ref 36.0–46.0)
Hemoglobin: 13.2 g/dL (ref 12.0–15.0)
Immature Granulocytes: 1 %
Lymphocytes Relative: 7 %
Lymphs Abs: 1.2 10*3/uL (ref 0.7–4.0)
MCH: 29.1 pg (ref 26.0–34.0)
MCHC: 29.6 g/dL — ABNORMAL LOW (ref 30.0–36.0)
MCV: 98.2 fL (ref 80.0–100.0)
Monocytes Absolute: 0.3 10*3/uL (ref 0.1–1.0)
Monocytes Relative: 2 %
Neutro Abs: 16.4 10*3/uL — ABNORMAL HIGH (ref 1.7–7.7)
Neutrophils Relative %: 90 %
Platelets: 242 10*3/uL (ref 150–400)
RBC: 4.54 MIL/uL (ref 3.87–5.11)
RDW: 16 % — ABNORMAL HIGH (ref 11.5–15.5)
WBC: 18.2 10*3/uL — ABNORMAL HIGH (ref 4.0–10.5)
nRBC: 0 % (ref 0.0–0.2)

## 2023-04-08 LAB — HEMOGLOBIN A1C
Hgb A1c MFr Bld: 6.5 % — ABNORMAL HIGH (ref 4.8–5.6)
Mean Plasma Glucose: 139.85 mg/dL

## 2023-04-08 LAB — BASIC METABOLIC PANEL
Anion gap: 12 (ref 5–15)
BUN: 25 mg/dL — ABNORMAL HIGH (ref 8–23)
CO2: 25 mmol/L (ref 22–32)
Calcium: 9 mg/dL (ref 8.9–10.3)
Chloride: 99 mmol/L (ref 98–111)
Creatinine, Ser: 1.02 mg/dL — ABNORMAL HIGH (ref 0.44–1.00)
GFR, Estimated: >60 mL/min (ref >60)
Glucose, Bld: 264 mg/dL — ABNORMAL HIGH (ref 70–99)
Potassium: 4.4 mmol/L (ref 3.5–5.1)
Sodium: 136 mmol/L (ref 135–145)

## 2023-04-08 LAB — GLUCOSE, CAPILLARY
Glucose-Capillary: 211 mg/dL — ABNORMAL HIGH (ref 70–99)
Glucose-Capillary: 221 mg/dL — ABNORMAL HIGH (ref 70–99)
Glucose-Capillary: 237 mg/dL — ABNORMAL HIGH (ref 70–99)
Glucose-Capillary: 205 mg/dL — ABNORMAL HIGH (ref 70–99)

## 2023-04-08 LAB — MAGNESIUM: Magnesium: 2.3 mg/dL (ref 1.7–2.4)

## 2023-04-08 MED ORDER — INSULIN GLARGINE-YFGN 100 UNIT/ML ~~LOC~~ SOLN
8.0000 [IU] | Freq: Every day | SUBCUTANEOUS | Status: DC
  Administered 2023-04-08 – 2023-04-09 (×2): 8 [IU] via SUBCUTANEOUS
  Filled 2023-04-08 (×3): qty 0.08

## 2023-04-08 NOTE — TOC Progression Note (Addendum)
Transition of Care [facility]) - Progression Note    Patient Details  Name: Brenda [name] MRN: [id number] Date of Birth: [birth year]  [facility] CM/SW Contact  Otelia Santee, LCSW Phone Number: 04/08/2023, 10:42 AM  Clinical Narrative:    Pt from home with father. Pt has home O2 w/ [employer]. Pt has access to cane and RW at home. Met with pt to discuss recommendation for [facility]. Pt currently declining HH stating that she feels she will be back to her baseline when she returns home. CSW discussed option for OPPT. Pt declining OPPT. CSW encouraged pt to request to speak to CSW or follow up with PCP should she change her mind.         Expected Discharge Plan and Services                                               Social Determinants of Health (SDOH) Interventions SDOH Screenings   Food Insecurity: No Food Insecurity (04/04/2023)  Housing: Low Risk  (04/04/2023)  Transportation Needs: No Transportation Needs (04/04/2023)  Utilities: Not At Risk (04/04/2023)  Alcohol Screen: Low Risk  (10/21/2022)  Depression (PHQ2-9): Low Risk  (03/16/2023)  Financial Resource Strain: Low Risk  (10/21/2022)  Physical Activity: Insufficiently Active (10/21/2022)  Social Connections: Socially Isolated (10/21/2022)  Stress: No Stress Concern Present (10/21/2022)  Tobacco Use: Medium Risk (04/03/2023)    Readmission Risk Interventions    04/08/2023   10:42 AM 04/06/2023    7:43 PM 02/26/2023    3:38 PM  Readmission Risk Prevention Plan  Transportation Screening Complete Complete Complete  PCP or Specialist Appt within 3-5 Days  Complete Complete  HRI or Home Care Consult  Complete Complete  Social Work Consult for Recovery Care Planning/Counseling  Complete Complete  Palliative Care Screening  Not Applicable Not Applicable  Medication Review [employer]) Complete Referral to Pharmacy Complete  PCP or Specialist appointment within 3-5 days of discharge Complete    HRI or  Home Care Consult Complete    SW Recovery Care/Counseling Consult Complete    Palliative Care Screening Not Applicable    Skilled Nursing Facility Not Applicable

## 2023-04-08 NOTE — Progress Notes (Signed)
   04/08/23 2353  BiPAP/CPAP/SIPAP  Reason BIPAP/CPAP not in use Non-compliant  BiPAP/CPAP /SiPAP Vitals  Bilateral Breath Sounds Diminished;Expiratory wheezes

## 2023-04-08 NOTE — Progress Notes (Signed)
Carelink Summary Report / Loop Recorder 

## 2023-04-08 NOTE — Progress Notes (Signed)
PROGRESS NOTE    Brenda [name]  UEA:[id number] DOB: July 08, 1960 DOA: 04/03/2023 PCP: [name], MD    Chief Complaint  Patient presents with   Shortness of Breath    Brief Narrative:  62 y.o. female with medical history significant for coronary artery disease, chronic HFpEF, CKD 3A, COPD, former smoker, chronic hypoxic respiratory failure on 2 L nasal cannula, essential hypertension, type 2 diabetes, diabetic polyneuropathy, hyperlipidemia, GERD, severe morbid obesity, bipolar disorder/generalized anxiety disorder, admitted with worsening dyspnea coughing and wheezing for 2 days prior to admission.  Patient reports she has oxygen at home she uses continuously at 2 L.  In the ER she was hypoxic she required 4 L of oxygen to maintain her saturation above 92%.  She was tachypneic afebrile and hypoxic 95% on 4 L in the ER.     Assessment & Plan:   Principal Problem:   COPD with acute exacerbation (HCC) Active Problems:   Chronic hypoxic respiratory failure (HCC)   COPD exacerbation (HCC)   HYPERTRIGLYCERIDEMIA   Morbid obesity with BMI of 50.0-59.9, adult (HCC)   Leukocytosis   Ex-smoker   Essential hypertension   OSA on CPAP   Type 2 diabetes mellitus without complication (HCC)   Diastolic dysfunction   Unilateral primary osteoarthritis, left knee   Knee mass, right  #1 acute on chronic hypoxic respiratory failure secondary to acute COPD exacerbation -Patient presented with wheezing, shortness of breath, productive cough and increased O2 requirements requiring 4 L O2 with baseline O2 of 2 L nasal cannula. -Patient still with complaints of diffuse wheezing and shortness of breath with some improvement over the past 24 hours.  -SARS coronavirus 2 PCR negative. -Influenza A and B PCR negative. -RSV by PCR negative. -Respiratory viral panel negative. -Continue Brovana nebs, Pulmicort nebs, Flonase, Claritin, Pepcid. -Continue PPI, scheduled DuoNebs. -Continue IV Solu-Medrol  90 mg every 12 hours. -Increase IV Solu-Medrol to 60 mg every 8 hours. -Incentive spirometry. -Supportive care.  2.  Leukocytosis -Likely secondary to steroids. -Patient afebrile. -Follow.  3.  Type 2 diabetes with hyperglycemia -Hemoglobin A1c 6.4 (10/20/2022) -Repeat hemoglobin A1c noted at 6.5.Marland Kitchen -CBG 211 this morning.  -Trulicity prior to admission. -Patient currently on IV steroids. -Continue SSI.   -Start Semglee 8 units daily.   4.  HFpEF Currently euvolemic and stable on exam. -Continue home regimen aspirin, statin, Entresto, spironolactone. -BP somewhat borderline and as such we will continue to hold home regimen Lasix however could likely resume in the next 1 to 2 days.   -Continue to hold home regimen Toprol-XL.  5.  Hyperlipidemia -Statin.  6.  GERD -PPI.  7.  GAD/bipolar disorder -Continue Abilify, Lexapro.  8.  CKD stage IIIa -Stable.  9.  Obesity -BMI of 53.95 kg/m -Lifestyle modification -Outpatient follow-up with PCP.  10.  OSA -CPAP nightly.   DVT prophylaxis: Lovenox Code Status: Full Family Communication: Updated patient.  No family at bedside. Disposition: Likely home when clinically improved.  Status is: Inpatient Remains inpatient appropriate because: Severity of illness   Consultants:  None  Procedures:  Chest x-ray 04/03/2023   Antimicrobials:  Anti-infectives (From admission, onward)    Start     Dose/Rate Route Frequency Ordered Stop   04/05/23 0000  azithromycin (ZITHROMAX) tablet 500 mg        500 mg Oral Daily 04/04/23 1133     04/04/23 0000  azithromycin (ZITHROMAX) 500 mg in dextrose 5 % 250 mL IVPB  Status:  Discontinued  500 mg 250 mL/hr over 60 Minutes Intravenous Every 24 hours 04/03/23 2350 04/04/23 1133         Subjective: Laying in bed.  Patient states starting to feel better after nebulizer treatments added/adjusted yesterday.  Feels shortness of breath and wheezing and cough is improving.     Objective: Vitals:   04/07/23 2007 04/08/23 0409 04/08/23 0457 04/08/23 0746  BP:   115/63   Pulse:   72   Resp: 15  18   Temp:   98.1 F (36.7 C)   TempSrc:   Oral   SpO2:  97% 95% 99%  Weight:      Height:        Intake/Output Summary (Last 24 hours) at 04/08/2023 1244 Last data filed at 04/07/2023 2200 Gross per 24 hour  Intake 120 ml  Output --  Net 120 ml   Filed Weights   04/04/23 1448 04/06/23 0512 04/07/23 0500  Weight: 123.9 kg 125.3 kg 124.9 kg    Examination:  General exam: NAD. Respiratory system: Diffuse expiratory wheezing.  Scattered coarse breath sounds.  No crackles noted.  Moving air better.  Speaking in full sentences.   Cardiovascular system: RRR no murmurs rubs or gallops.  No JVD.  No lower extremity edema.  Gastrointestinal system: Abdomen is soft, nontender, nondistended, positive bowel sounds.  No rebound.  No guarding.   Central nervous system: Alert and oriented. No focal neurological deficits. Extremities: Symmetric 5 x 5 power. Skin: No rashes, lesions or ulcers Psychiatry: Judgement and insight appear normal. Mood & affect appropriate.     Data Reviewed: I have personally reviewed following labs and imaging studies  CBC: Recent Labs  Lab 04/03/23 2046 04/04/23 0500 04/07/23 0511 04/08/23 0527  WBC 15.3* 14.7* 16.8* 18.2*  NEUTROABS  --   --   --  16.4*  HGB 13.5 13.5 13.4 13.2  HCT 43.5 43.1 44.3 44.6  MCV 95.8 93.9 96.5 98.2  PLT 275 262 239 242    Basic Metabolic Panel: Recent Labs  Lab 04/03/23 2135 04/04/23 0500 04/07/23 0511 04/08/23 0527  NA 140 139 141 136  K 3.7 4.4 4.0 4.4  CL 102 105 101 99  CO2 29 28 30 25   GLUCOSE 132* 162* 130* 264*  BUN 14 14 20  25*  CREATININE 1.14* 0.97 0.89 1.02*  CALCIUM 8.6* 8.8* 8.9 9.0  MG  --  2.7*  --  2.3  PHOS  --  2.8  --   --     GFR: Estimated Creatinine Clearance: 68 mL/min (A) (by C-G formula based on SCr of 1.02 mg/dL (H)).  Liver Function Tests: Recent Labs   Lab 04/07/23 0511  AST 16  ALT 21  ALKPHOS 98  BILITOT 0.5  PROT 7.2  ALBUMIN 3.4*    CBG: Recent Labs  Lab 04/07/23 1158 04/07/23 1904 04/07/23 2144 04/08/23 0740 04/08/23 1120  GLUCAP 199* 250* 163* 211* 221*     Recent Results (from the past 240 hour(s))  Resp panel by RT-PCR (RSV, Flu A&B, Covid) Anterior Nasal Swab     Status: None   Collection Time: 04/07/23 12:49 PM   Specimen: Anterior Nasal Swab  Result Value Ref Range Status   SARS Coronavirus 2 by RT PCR NEGATIVE NEGATIVE Final    Comment: (NOTE) SARS-CoV-2 target nucleic acids are NOT DETECTED.  The SARS-CoV-2 RNA is generally detectable in upper respiratory specimens during the acute phase of infection. The lowest concentration of SARS-CoV-2 viral copies this assay can  detect is 138 copies/mL. A negative result does not preclude SARS-Cov-2 infection and should not be used as the sole basis for treatment or other patient management decisions. A negative result may occur with  improper specimen collection/handling, submission of specimen other than nasopharyngeal swab, presence of viral mutation(s) within the areas targeted by this assay, and inadequate number of viral copies(<138 copies/mL). A negative result must be combined with clinical observations, patient history, and epidemiological information. The expected result is Negative.  Fact Sheet for Patients:  BloggerCourse.com  Fact Sheet for Healthcare Providers:  SeriousBroker.it  This test is no t yet approved or cleared by the [place] FDA and  has been authorized for detection and/or diagnosis of SARS-CoV-2 by FDA under an Emergency Use Authorization (EUA). This EUA will remain  in effect (meaning this test can be used) for the duration of the COVID-19 declaration under Section 564(b)(1) of the Act, 21 U.S.C.section 360bbb-3(b)(1), unless the authorization is terminated  or revoked  sooner.       Influenza A by PCR NEGATIVE NEGATIVE Final   Influenza B by PCR NEGATIVE NEGATIVE Final    Comment: (NOTE) The Xpert Xpress SARS-CoV-2/FLU/RSV plus assay is intended as an aid in the diagnosis of influenza from Nasopharyngeal swab specimens and should not be used as a sole basis for treatment. Nasal washings and aspirates are unacceptable for Xpert Xpress SARS-CoV-2/FLU/RSV testing.  Fact Sheet for Patients: BloggerCourse.com  Fact Sheet for Healthcare Providers: SeriousBroker.it  This test is not yet approved or cleared by the [place] FDA and has been authorized for detection and/or diagnosis of SARS-CoV-2 by FDA under an Emergency Use Authorization (EUA). This EUA will remain in effect (meaning this test can be used) for the duration of the COVID-19 declaration under Section 564(b)(1) of the Act, 21 U.S.C. section 360bbb-3(b)(1), unless the authorization is terminated or revoked.     Resp Syncytial Virus by PCR NEGATIVE NEGATIVE Final    Comment: (NOTE) Fact Sheet for Patients: BloggerCourse.com  Fact Sheet for Healthcare Providers: SeriousBroker.it  This test is not yet approved or cleared by the [place] FDA and has been authorized for detection and/or diagnosis of SARS-CoV-2 by FDA under an Emergency Use Authorization (EUA). This EUA will remain in effect (meaning this test can be used) for the duration of the COVID-19 declaration under Section 564(b)(1) of the Act, 21 U.S.C. section 360bbb-3(b)(1), unless the authorization is terminated or revoked.  Performed at [facility], [address] [place], [place] [address]   Respiratory (~20 pathogens) panel by PCR     Status: None   Collection Time: 04/07/23 12:49 PM  Result Value Ref Range Status   Adenovirus NOT DETECTED NOT DETECTED Final   Coronavirus 229E NOT DETECTED  NOT DETECTED Final    Comment: (NOTE) The Coronavirus on the Respiratory Panel, DOES NOT test for the novel  Coronavirus (2019 nCoV)    Coronavirus HKU1 NOT DETECTED NOT DETECTED Final   Coronavirus NL63 NOT DETECTED NOT DETECTED Final   Coronavirus OC43 NOT DETECTED NOT DETECTED Final   Metapneumovirus NOT DETECTED NOT DETECTED Final   Rhinovirus / Enterovirus NOT DETECTED NOT DETECTED Final   Influenza A NOT DETECTED NOT DETECTED Final   Influenza B NOT DETECTED NOT DETECTED Final   Parainfluenza Virus 1 NOT DETECTED NOT DETECTED Final   Parainfluenza Virus 2 NOT DETECTED NOT DETECTED Final   Parainfluenza Virus 3 NOT DETECTED NOT DETECTED Final   Parainfluenza Virus 4 NOT DETECTED NOT DETECTED  Final   Respiratory Syncytial Virus NOT DETECTED NOT DETECTED Final   Bordetella pertussis NOT DETECTED NOT DETECTED Final   Bordetella Parapertussis NOT DETECTED NOT DETECTED Final   Chlamydophila pneumoniae NOT DETECTED NOT DETECTED Final   Mycoplasma pneumoniae NOT DETECTED NOT DETECTED Final    Comment: Performed at [facility], [address] [place], [place] [address]         Radiology Studies: No results found.      Scheduled Meds:  arformoterol  15 mcg Nebulization BID   ARIPiprazole  10 mg Oral Daily   aspirin EC  81 mg Oral Daily   atorvastatin  80 mg Oral Daily   azithromycin  500 mg Oral Daily   budesonide (PULMICORT) nebulizer solution  0.5 mg Nebulization BID   cholecalciferol  1,000 Units Oral Daily   enoxaparin (LOVENOX) injection  40 mg Subcutaneous Q24H   escitalopram  20 mg Oral Daily   famotidine  20 mg Oral Daily   fluticasone  2 spray Each Nare Daily   gabapentin  300 mg Oral Daily   And   gabapentin  300 mg Oral QHS   insulin aspart  0-20 Units Subcutaneous TID WC   insulin aspart  0-5 Units Subcutaneous QHS   ipratropium-albuterol  3 mL Nebulization Q4H   loratadine  10 mg Oral Daily   methylPREDNISolone (SOLU-MEDROL) injection  90 mg  Intravenous Q12H   multivitamin with minerals  1 tablet Oral Daily   pantoprazole  40 mg Oral QHS   sacubitril-valsartan  1 tablet Oral BID   spironolactone  25 mg Oral Daily   umeclidinium bromide  1 puff Inhalation Daily   Continuous Infusions:   LOS: 5 days    Time spent: 40 minutes    Ramiro Harvest, MD [facility]   To contact the attending provider between 7A-7P or the covering provider during after hours 7P-7A, please log into the web site [email or web address] and access using universal [place] password for that web site. If you do not have the password, please call the hospital operator.  04/08/2023, 12:44 PM

## 2023-04-08 NOTE — Progress Notes (Signed)
Occupational Therapy Treatment Patient Details Name: Brenda [name] MRN: [id number] DOB: [birth year] Today's Date: 04/08/2023   History of present illness Patient is a 62 year old female who presented to the ED 04/03/23  from home due to worsening dyspnea with minimal exertion, coughing, and wheezing for the past 2 days.  Found to have acute on chronic respiratory failure secondary to COPD exacerbation. PMH: coronary artery disease, chronic HFpEF, CKD 3A, COPD, former smoker, chronic hypoxic respiratory failure on 2 L nasal cannula, essential hypertension, type 2 diabetes, diabetic polyneuropathy, hyperlipidemia, GERD, severe morbid obesity, bipolar disorder/generalized anxiety disorder,   OT comments  The pt was educated on implementing energy conservation strategies as needed during ADLs and/or IADLs in the home, given her chronic endurance deficits and shortness of breath with activity; a relevant handout was reviewed and provided. She presented with good understanding. She further performed assessed tasks with modified independence or better, including ambulating in the hall without an assistive device. Her O2 saturation was 92% on 2L O2 with activity.  She has made adequate functional progress during her hospital stay and has met her OT goals, No further OT needs identified. OT will sign off.       If plan is discharge home, recommend the following:  Assist for transportation;Assistance with cooking/housework   Equipment Recommendations  None recommended by OT    Recommendations for Other Services      Precautions / Restrictions Restrictions Weight Bearing Restrictions: No Other Position/Activity Restrictions: monitor O2, O2 use at baseline       Mobility Bed Mobility Overal bed mobility: Independent Bed Mobility: Supine to Sit     Supine to sit: Independent          Transfers Overall transfer level: Independent Equipment used: None Transfers: Sit to/from Stand Sit to  Stand: Independent                     ADL either performed or assessed with clinical judgement   ADL Overall ADL's : Modified independent;Independent                      Cognition Arousal: Alert Behavior During Therapy: WFL for tasks assessed/performed Overall Cognitive Status: Within Functional Limits for tasks assessed                                Pertinent Vitals/ Pain       Pain Assessment Pain Assessment: No/denies pain         Frequency   (N/A)        Progress Toward Goals  OT Goals(current goals can now be found in the care plan section)  Progress towards OT goals: Goals met/education completed, patient discharged from OT            AM-PAC OT "6 Clicks" Daily Activity     Outcome Measure   Help from another person eating meals?: None Help from another person taking care of personal grooming?: None Help from another person toileting, which includes using toliet, bedpan, or urinal?: None Help from another person bathing (including washing, rinsing, drying)?: A Little Help from another person to put on and taking off regular upper body clothing?: None Help from another person to put on and taking off regular lower body clothing?: None 6 Click Score: 23    End of Session Equipment Utilized During Treatment: Oxygen  OT Visit Diagnosis: Unsteadiness on feet (  R26.81);Muscle weakness (generalized) (M62.81)   Activity Tolerance Patient tolerated treatment well   Patient Left in bed;with call bell/phone within reach   Nurse Communication Mobility status        Time: 1050-1102 OT Time Calculation (min): 12 min  Charges: OT General Charges $OT Visit: 1 Visit OT Treatments $Self Care/Home Management : 8-22 mins    Reuben Likes, OTR/L 04/08/2023, 3:01 PM

## 2023-04-09 DIAGNOSIS — I10 Essential (primary) hypertension: Secondary | ICD-10-CM | POA: Diagnosis not present

## 2023-04-09 DIAGNOSIS — J9611 Chronic respiratory failure with hypoxia: Secondary | ICD-10-CM | POA: Diagnosis not present

## 2023-04-09 DIAGNOSIS — D72829 Elevated white blood cell count, unspecified: Secondary | ICD-10-CM | POA: Diagnosis not present

## 2023-04-09 DIAGNOSIS — J441 Chronic obstructive pulmonary disease with (acute) exacerbation: Secondary | ICD-10-CM | POA: Diagnosis not present

## 2023-04-09 LAB — BASIC METABOLIC PANEL
Anion gap: 9 (ref 5–15)
BUN: 26 mg/dL — ABNORMAL HIGH (ref 8–23)
CO2: 26 mmol/L (ref 22–32)
Calcium: 9.1 mg/dL (ref 8.9–10.3)
Chloride: 100 mmol/L (ref 98–111)
Creatinine, Ser: 0.9 mg/dL (ref 0.44–1.00)
GFR, Estimated: >60 mL/min (ref >60)
Glucose, Bld: 229 mg/dL — ABNORMAL HIGH (ref 70–99)
Potassium: 4.6 mmol/L (ref 3.5–5.1)
Sodium: 135 mmol/L (ref 135–145)

## 2023-04-09 LAB — CBC WITH DIFFERENTIAL/PLATELET
Abs Immature Granulocytes: 0.22 10*3/uL — ABNORMAL HIGH (ref 0.00–0.07)
Basophils Absolute: 0.1 10*3/uL (ref 0.0–0.1)
Basophils Relative: 0 %
Eosinophils Absolute: 0 10*3/uL (ref 0.0–0.5)
Eosinophils Relative: 0 %
HCT: 42.9 % (ref 36.0–46.0)
Hemoglobin: 13.1 g/dL (ref 12.0–15.0)
Immature Granulocytes: 1 %
Lymphocytes Relative: 10 %
Lymphs Abs: 2.2 10*3/uL (ref 0.7–4.0)
MCH: 29.2 pg (ref 26.0–34.0)
MCHC: 30.5 g/dL (ref 30.0–36.0)
MCV: 95.5 fL (ref 80.0–100.0)
Monocytes Absolute: 1.1 10*3/uL — ABNORMAL HIGH (ref 0.1–1.0)
Monocytes Relative: 5 %
Neutro Abs: 18.9 10*3/uL — ABNORMAL HIGH (ref 1.7–7.7)
Neutrophils Relative %: 84 %
Platelets: 254 10*3/uL (ref 150–400)
RBC: 4.49 MIL/uL (ref 3.87–5.11)
RDW: 16 % — ABNORMAL HIGH (ref 11.5–15.5)
WBC: 22.4 10*3/uL — ABNORMAL HIGH (ref 4.0–10.5)
nRBC: 0 % (ref 0.0–0.2)

## 2023-04-09 LAB — GLUCOSE, CAPILLARY
Glucose-Capillary: 168 mg/dL — ABNORMAL HIGH (ref 70–99)
Glucose-Capillary: 207 mg/dL — ABNORMAL HIGH (ref 70–99)
Glucose-Capillary: 279 mg/dL — ABNORMAL HIGH (ref 70–99)
Glucose-Capillary: 250 mg/dL — ABNORMAL HIGH (ref 70–99)

## 2023-04-09 MED ORDER — IPRATROPIUM-ALBUTEROL 0.5-2.5 (3) MG/3ML IN SOLN
3.0000 mL | Freq: Four times a day (QID) | RESPIRATORY_TRACT | Status: DC
  Administered 2023-04-09 – 2023-04-10 (×6): 3 mL via RESPIRATORY_TRACT
  Filled 2023-04-09 (×6): qty 3

## 2023-04-09 MED ORDER — METHYLPREDNISOLONE SODIUM SUCC 125 MG IJ SOLR
60.0000 mg | Freq: Two times a day (BID) | INTRAMUSCULAR | Status: DC
Start: 2023-04-09 — End: 2023-04-11
  Administered 2023-04-09 – 2023-04-11 (×4): 60 mg via INTRAVENOUS
  Filled 2023-04-09 (×4): qty 2

## 2023-04-09 NOTE — Plan of Care (Signed)
  Problem: Education: Goal: Knowledge of General Education information will improve Description: Including pain rating scale, medication(s)/side effects and non-pharmacologic comfort measures Outcome: Progressing   Problem: Health Behavior/Discharge Planning: Goal: Ability to manage health-related needs will improve Outcome: Progressing   Problem: Clinical Measurements: Goal: Will remain free from infection Outcome: Progressing Goal: Diagnostic test results will improve Outcome: Progressing   Problem: Safety: Goal: Ability to remain free from injury will improve Outcome: Progressing

## 2023-04-09 NOTE — Plan of Care (Signed)

## 2023-04-09 NOTE — Progress Notes (Addendum)
Physical Therapy Treatment Patient Details Name: Brenda [name] MRN: [id number] DOB: [birth year] Today's Date: 04/09/2023   History of Present Illness Patient is a 62 year old female who presented to the ED 04/03/23  from home due to worsening dyspnea with minimal exertion, coughing, and wheezing for the past 2 days.  No reported subjective fevers or chills.PMH: coronary artery disease, chronic HFpEF, CKD 3A, COPD, former smoker, chronic hypoxic respiratory failure on 2 L nasal cannula, essential hypertension, type 2 diabetes, diabetic polyneuropathy, hyperlipidemia, GERD, severe morbid obesity, bipolar disorder/generalized anxiety disorder,    PT Comments  Pt is progressing with activity tolerance. She ambulated 90' x 2 with standing rest break, SpO2 88% on 3L O2 walking, 92% on 4L O2 walking, 3/4 dyspnea, verbal cues for pursed lip breathing. Pt stated she's not feeling back to baseline with her breathing.    If plan is discharge home, recommend the following: A little help with bathing/dressing/bathroom;Help with stairs or ramp for entrance;Assistance with cooking/housework;Assist for transportation   Can travel by private vehicle        Equipment Recommendations  None recommended by PT    Recommendations for Other Services       Precautions / Restrictions Precautions Precaution Comments: monitor O2, on 2 L at baseline Restrictions Weight Bearing Restrictions: No     Mobility  Bed Mobility Overal bed mobility: Independent Bed Mobility: Supine to Sit     Supine to sit: Independent          Transfers Overall transfer level: Independent Equipment used: None Transfers: Sit to/from Stand Sit to Stand: Independent                Ambulation/Gait Ambulation/Gait assistance: supervision Gait Distance (Feet): 180 Feet Assistive device: None Gait Pattern/deviations: WFL(Within Functional Limits) Gait velocity: decr     General Gait Details: 56' x 2 with standing rest  break, 3/4 dyspnea, VCs for PLB, SpO2 88% on 3L, 92% on 4L walking; no loss of balance   Stairs             Wheelchair Mobility     Tilt Bed    Modified Rankin (Stroke Patients Only)       Balance Overall balance assessment: Mild deficits observed, not formally tested                                          Cognition Arousal: Alert Behavior During Therapy: WFL for tasks assessed/performed Overall Cognitive Status: Within Functional Limits for tasks assessed                                          Exercises      General Comments        Pertinent Vitals/Pain Pain Assessment Pain Assessment: No/denies pain    Home Living                          Prior Function            PT Goals (current goals can now be found in the care plan section) Acute Rehab PT Goals Patient Stated Goal: go home PT Goal Formulation: With patient Time For Goal Achievement: 04/19/23 Potential to Achieve Goals: Good Progress towards PT goals: Progressing toward goals  Frequency    Min 1X/week      PT Plan      Co-evaluation              AM-PAC PT "6 Clicks" Mobility   Outcome Measure  Help needed turning from your back to your side while in a flat bed without using bedrails?: None Help needed moving from lying on your back to sitting on the side of a flat bed without using bedrails?: None Help needed moving to and from a bed to a chair (including a wheelchair)?: None Help needed standing up from a chair using your arms (e.g., wheelchair or bedside chair)?: None Help needed to walk in hospital room?: None Help needed climbing 3-5 steps with a railing? : A Little 6 Click Score: 23    End of Session Equipment Utilized During Treatment: Gait belt Activity Tolerance: Patient limited by fatigue Patient left: in bed;with call bell/phone within reach Nurse Communication: Mobility status PT Visit Diagnosis: Difficulty  in walking, not elsewhere classified (R26.2)     Time: 1610-9604 PT Time Calculation (min) (ACUTE ONLY): 9 min  Charges:    $Gait Training: 8-22 mins PT General Charges $$ ACUTE PT VISIT: 1 Visit                     Tamala Ser PT 04/09/2023  Acute Rehabilitation Services  Office [phone]

## 2023-04-09 NOTE — Progress Notes (Signed)
PROGRESS NOTE    Brenda [name]  ZOX:[id number] DOB: 04/05/1961 DOA: 04/03/2023 PCP: [name], MD    Chief Complaint  Patient presents with   Shortness of Breath    Brief Narrative:  62 y.o. female with medical history significant for coronary artery disease, chronic HFpEF, CKD 3A, COPD, former smoker, chronic hypoxic respiratory failure on 2 L nasal cannula, essential hypertension, type 2 diabetes, diabetic polyneuropathy, hyperlipidemia, GERD, severe morbid obesity, bipolar disorder/generalized anxiety disorder, admitted with worsening dyspnea coughing and wheezing for 2 days prior to admission.  Patient reports she has oxygen at home she uses continuously at 2 L.  In the ER she was hypoxic she required 4 L of oxygen to maintain her saturation above 92%.  She was tachypneic afebrile and hypoxic 95% on 4 L in the ER.     Assessment & Plan:   Principal Problem:   COPD with acute exacerbation (HCC) Active Problems:   Chronic hypoxic respiratory failure (HCC)   COPD exacerbation (HCC)   HYPERTRIGLYCERIDEMIA   Morbid obesity with BMI of 50.0-59.9, adult (HCC)   Leukocytosis   Ex-smoker   Essential hypertension   OSA on CPAP   Type 2 diabetes mellitus without complication (HCC)   Diastolic dysfunction   Unilateral primary osteoarthritis, left knee   Knee mass, right  #1 acute on chronic hypoxic respiratory failure secondary to acute COPD exacerbation -Patient presented with wheezing, shortness of breath, productive cough and increased O2 requirements requiring 4 L O2 with baseline O2 of 2 L nasal cannula. -Patient still with complaints of diffuse wheezing and shortness of breath with some improvement over the past 24-48 hours.  -SARS coronavirus 2 PCR negative. -Influenza A and B PCR negative. -RSV by PCR negative. -Respiratory viral panel negative. -Continue Brovana nebs, Pulmicort nebs, Flonase, Claritin, Pepcid. -Continue PPI, scheduled DuoNebs. -Decrease IV  Solu-Medrol to 60 mg every 12 hours.   -Incentive spirometry. -Supportive care.  2.  Leukocytosis -Likely secondary to steroids. -Patient afebrile. -Follow.  3.  Type 2 diabetes with hyperglycemia -Hemoglobin A1c 6.4 (10/20/2022) -Repeat hemoglobin A1c noted at 6.5.Marland Kitchen -CBG 168 this morning.  -Patient was on Trulicity prior to admission. -Patient currently on IV steroids. -Continue SSI.   -Continue Semglee 8 units daily.    4.  HFpEF Currently euvolemic and stable on exam. -Continue home regimen aspirin, statin, Entresto, spironolactone. -BP somewhat borderline and as such we will continue to hold home regimen Lasix however could likely resume in the next 1 to 2 days.   -Continue to hold home regimen Toprol-XL with acute exacerbation of COPD.Marland Kitchen  5.  Hyperlipidemia -Continue statin.  6.  GERD -PPI.  7.  GAD/bipolar disorder -Stable.   -Continue Lexapro, Abilify.   8.  CKD stage IIIa -Stable.  9.  Obesity -BMI of 53.95 kg/m -Lifestyle modification -Outpatient follow-up with PCP.  10.  OSA -CPAP nightly.    DVT prophylaxis: Lovenox Code Status: Full Family Communication: Updated patient.  No family at bedside. Disposition: Likely home when clinically improved.  Status is: Inpatient Remains inpatient appropriate because: Severity of illness   Consultants:  None  Procedures:  Chest x-ray 04/03/2023   Antimicrobials:  Anti-infectives (From admission, onward)    Start     Dose/Rate Route Frequency Ordered Stop   04/05/23 0000  azithromycin (ZITHROMAX) tablet 500 mg        500 mg Oral Daily 04/04/23 1133     04/04/23 0000  azithromycin (ZITHROMAX) 500 mg in dextrose 5 % 250 mL  IVPB  Status:  Discontinued        500 mg 250 mL/hr over 60 Minutes Intravenous Every 24 hours 04/03/23 2350 04/04/23 1133         Subjective: Patient sitting up in chair.  States still winded with ambulation.  Shortness of breath and wheezing slowly improving.  Cough slowly  improving.   Objective: Vitals:   04/08/23 1928 04/09/23 0452 04/09/23 0500 04/09/23 0756  BP: 137/74 109/63    Pulse: 87 70    Resp:      Temp: 98.3 F (36.8 C) 97.9 F (36.6 C)    TempSrc: Oral Oral    SpO2: 97% 95%  96%  Weight:   123.9 kg   Height:       No intake or output data in the 24 hours ending 04/09/23 1303  Filed Weights   04/06/23 0512 04/07/23 0500 04/09/23 0500  Weight: 125.3 kg 124.9 kg 123.9 kg    Examination:  General exam: NAD. Respiratory system: Decreasing expiratory wheezing.  Decreased scattered coarse breath sounds.  No crackles.  Upper airway noise.   Cardiovascular system: Regular rate rhythm no murmurs rubs or gallops.  No JVD.  No lower extremity edema.  Gastrointestinal system: Abdomen is soft, nontender, nondistended, obese, positive bowel sounds.  No rebound.  No guarding.   Central nervous system: Alert and oriented. No focal neurological deficits. Extremities: Symmetric 5 x 5 power. Skin: No rashes, lesions or ulcers Psychiatry: Judgement and insight appear normal. Mood & affect appropriate.     Data Reviewed: I have personally reviewed following labs and imaging studies  CBC: Recent Labs  Lab 04/03/23 2046 04/04/23 0500 04/07/23 0511 04/08/23 0527 04/09/23 0603  WBC 15.3* 14.7* 16.8* 18.2* 22.4*  NEUTROABS  --   --   --  16.4* 18.9*  HGB 13.5 13.5 13.4 13.2 13.1  HCT 43.5 43.1 44.3 44.6 42.9  MCV 95.8 93.9 96.5 98.2 95.5  PLT 275 262 239 242 254    Basic Metabolic Panel: Recent Labs  Lab 04/03/23 2135 04/04/23 0500 04/07/23 0511 04/08/23 0527 04/09/23 0603  NA 140 139 141 136 135  K 3.7 4.4 4.0 4.4 4.6  CL 102 105 101 99 100  CO2 29 28 30 25 26   GLUCOSE 132* 162* 130* 264* 229*  BUN 14 14 20  25* 26*  CREATININE 1.14* 0.97 0.89 1.02* 0.90  CALCIUM 8.6* 8.8* 8.9 9.0 9.1  MG  --  2.7*  --  2.3  --   PHOS  --  2.8  --   --   --     GFR: Estimated Creatinine Clearance: 76.7 mL/min (by C-G formula based on SCr of  0.9 mg/dL).  Liver Function Tests: Recent Labs  Lab 04/07/23 0511  AST 16  ALT 21  ALKPHOS 98  BILITOT 0.5  PROT 7.2  ALBUMIN 3.4*    CBG: Recent Labs  Lab 04/08/23 1120 04/08/23 1627 04/08/23 2112 04/09/23 0750 04/09/23 1230  GLUCAP 221* 237* 205* 168* 207*     Recent Results (from the past 240 hour(s))  Resp panel by RT-PCR (RSV, Flu A&B, Covid) Anterior Nasal Swab     Status: None   Collection Time: 04/07/23 12:49 PM   Specimen: Anterior Nasal Swab  Result Value Ref Range Status   SARS Coronavirus 2 by RT PCR NEGATIVE NEGATIVE Final    Comment: (NOTE) SARS-CoV-2 target nucleic acids are NOT DETECTED.  The SARS-CoV-2 RNA is generally detectable in upper respiratory specimens during the acute  phase of infection. The lowest concentration of SARS-CoV-2 viral copies this assay can detect is 138 copies/mL. A negative result does not preclude SARS-Cov-2 infection and should not be used as the sole basis for treatment or other patient management decisions. A negative result may occur with  improper specimen collection/handling, submission of specimen other than nasopharyngeal swab, presence of viral mutation(s) within the areas targeted by this assay, and inadequate number of viral copies(<138 copies/mL). A negative result must be combined with clinical observations, patient history, and epidemiological information. The expected result is Negative.  Fact Sheet for Patients:  BloggerCourse.com  Fact Sheet for Healthcare Providers:  SeriousBroker.it  This test is no t yet approved or cleared by the [place] FDA and  has been authorized for detection and/or diagnosis of SARS-CoV-2 by FDA under an Emergency Use Authorization (EUA). This EUA will remain  in effect (meaning this test can be used) for the duration of the COVID-19 declaration under Section 564(b)(1) of the Act, 21 U.S.C.section 360bbb-3(b)(1), unless  the authorization is terminated  or revoked sooner.       Influenza A by PCR NEGATIVE NEGATIVE Final   Influenza B by PCR NEGATIVE NEGATIVE Final    Comment: (NOTE) The Xpert Xpress SARS-CoV-2/FLU/RSV plus assay is intended as an aid in the diagnosis of influenza from Nasopharyngeal swab specimens and should not be used as a sole basis for treatment. Nasal washings and aspirates are unacceptable for Xpert Xpress SARS-CoV-2/FLU/RSV testing.  Fact Sheet for Patients: BloggerCourse.com  Fact Sheet for Healthcare Providers: SeriousBroker.it  This test is not yet approved or cleared by the [place] FDA and has been authorized for detection and/or diagnosis of SARS-CoV-2 by FDA under an Emergency Use Authorization (EUA). This EUA will remain in effect (meaning this test can be used) for the duration of the COVID-19 declaration under Section 564(b)(1) of the Act, 21 U.S.C. section 360bbb-3(b)(1), unless the authorization is terminated or revoked.     Resp Syncytial Virus by PCR NEGATIVE NEGATIVE Final    Comment: (NOTE) Fact Sheet for Patients: BloggerCourse.com  Fact Sheet for Healthcare Providers: SeriousBroker.it  This test is not yet approved or cleared by the [place] FDA and has been authorized for detection and/or diagnosis of SARS-CoV-2 by FDA under an Emergency Use Authorization (EUA). This EUA will remain in effect (meaning this test can be used) for the duration of the COVID-19 declaration under Section 564(b)(1) of the Act, 21 U.S.C. section 360bbb-3(b)(1), unless the authorization is terminated or revoked.  Performed at [facility], [address], [place], [place] [address]   Respiratory (~20 pathogens) panel by PCR     Status: None   Collection Time: 04/07/23 12:49 PM  Result Value Ref Range Status   Adenovirus NOT DETECTED NOT  DETECTED Final   Coronavirus 229E NOT DETECTED NOT DETECTED Final    Comment: (NOTE) The Coronavirus on the Respiratory Panel, DOES NOT test for the novel  Coronavirus (2019 nCoV)    Coronavirus HKU1 NOT DETECTED NOT DETECTED Final   Coronavirus NL63 NOT DETECTED NOT DETECTED Final   Coronavirus OC43 NOT DETECTED NOT DETECTED Final   Metapneumovirus NOT DETECTED NOT DETECTED Final   Rhinovirus / Enterovirus NOT DETECTED NOT DETECTED Final   Influenza A NOT DETECTED NOT DETECTED Final   Influenza B NOT DETECTED NOT DETECTED Final   Parainfluenza Virus 1 NOT DETECTED NOT DETECTED Final   Parainfluenza Virus 2 NOT DETECTED NOT DETECTED Final   Parainfluenza Virus 3 NOT  DETECTED NOT DETECTED Final   Parainfluenza Virus 4 NOT DETECTED NOT DETECTED Final   Respiratory Syncytial Virus NOT DETECTED NOT DETECTED Final   Bordetella pertussis NOT DETECTED NOT DETECTED Final   Bordetella Parapertussis NOT DETECTED NOT DETECTED Final   Chlamydophila pneumoniae NOT DETECTED NOT DETECTED Final   Mycoplasma pneumoniae NOT DETECTED NOT DETECTED Final    Comment: Performed at [facility], [address] [place], [place] [address]         Radiology Studies: DG Chest 2 View  Result Date: 04/08/2023 CLINICAL DATA:  Hypoxia. EXAM: CHEST - 2 VIEW COMPARISON:  April 03, 2023. FINDINGS: The heart size and mediastinal contours are within normal limits. Right lung is clear. Minimal left basilar subsegmental atelectasis is noted. The visualized skeletal structures are unremarkable. IMPRESSION: Minimal left basilar subsegmental atelectasis. Electronically Signed   By: Lupita Raider M.D.   On: 04/08/2023 13:25        Scheduled Meds:  arformoterol  15 mcg Nebulization BID   ARIPiprazole  10 mg Oral Daily   aspirin EC  81 mg Oral Daily   atorvastatin  80 mg Oral Daily   azithromycin  500 mg Oral Daily   budesonide (PULMICORT) nebulizer solution  0.5 mg Nebulization BID   cholecalciferol   1,000 Units Oral Daily   enoxaparin (LOVENOX) injection  40 mg Subcutaneous Q24H   escitalopram  20 mg Oral Daily   famotidine  20 mg Oral Daily   fluticasone  2 spray Each Nare Daily   gabapentin  300 mg Oral Daily   And   gabapentin  300 mg Oral QHS   insulin aspart  0-20 Units Subcutaneous TID WC   insulin aspart  0-5 Units Subcutaneous QHS   insulin glargine-yfgn  8 Units Subcutaneous Daily   ipratropium-albuterol  3 mL Nebulization Q6H   loratadine  10 mg Oral Daily   methylPREDNISolone (SOLU-MEDROL) injection  90 mg Intravenous Q12H   multivitamin with minerals  1 tablet Oral Daily   pantoprazole  40 mg Oral QHS   sacubitril-valsartan  1 tablet Oral BID   spironolactone  25 mg Oral Daily   umeclidinium bromide  1 puff Inhalation Daily   Continuous Infusions:   LOS: 6 days    Time spent: 40 minutes    Ramiro Harvest, MD [facility]   To contact the attending provider between 7A-7P or the covering provider during after hours 7P-7A, please log into the web site [email or web address] and access using universal [place] password for that web site. If you do not have the password, please call the hospital operator.  04/09/2023, 1:03 PM

## 2023-04-09 NOTE — Progress Notes (Signed)
   04/09/23 1921  BiPAP/CPAP/SIPAP  BiPAP/CPAP/SIPAP Pt Type Adult  Reason BIPAP/CPAP not in use Non-compliant

## 2023-04-10 DIAGNOSIS — J441 Chronic obstructive pulmonary disease with (acute) exacerbation: Secondary | ICD-10-CM | POA: Diagnosis not present

## 2023-04-10 DIAGNOSIS — J9611 Chronic respiratory failure with hypoxia: Secondary | ICD-10-CM | POA: Diagnosis not present

## 2023-04-10 DIAGNOSIS — D72829 Elevated white blood cell count, unspecified: Secondary | ICD-10-CM | POA: Diagnosis not present

## 2023-04-10 DIAGNOSIS — I10 Essential (primary) hypertension: Secondary | ICD-10-CM | POA: Diagnosis not present

## 2023-04-10 LAB — BASIC METABOLIC PANEL
Anion gap: 9 (ref 5–15)
BUN: 28 mg/dL — ABNORMAL HIGH (ref 8–23)
CO2: 28 mmol/L (ref 22–32)
Calcium: 9.1 mg/dL (ref 8.9–10.3)
Chloride: 98 mmol/L (ref 98–111)
Creatinine, Ser: 1.02 mg/dL — ABNORMAL HIGH (ref 0.44–1.00)
GFR, Estimated: >60 mL/min (ref >60)
Glucose, Bld: 278 mg/dL — ABNORMAL HIGH (ref 70–99)
Potassium: 4.4 mmol/L (ref 3.5–5.1)
Sodium: 135 mmol/L (ref 135–145)

## 2023-04-10 LAB — CBC WITH DIFFERENTIAL/PLATELET
Abs Immature Granulocytes: 0.22 10*3/uL — ABNORMAL HIGH (ref 0.00–0.07)
Basophils Absolute: 0 10*3/uL (ref 0.0–0.1)
Basophils Relative: 0 %
Eosinophils Absolute: 0 10*3/uL (ref 0.0–0.5)
Eosinophils Relative: 0 %
HCT: 41 % (ref 36.0–46.0)
Hemoglobin: 13 g/dL (ref 12.0–15.0)
Immature Granulocytes: 1 %
Lymphocytes Relative: 10 %
Lymphs Abs: 2.2 10*3/uL (ref 0.7–4.0)
MCH: 29.9 pg (ref 26.0–34.0)
MCHC: 31.7 g/dL (ref 30.0–36.0)
MCV: 94.3 fL (ref 80.0–100.0)
Monocytes Absolute: 1 10*3/uL (ref 0.1–1.0)
Monocytes Relative: 5 %
Neutro Abs: 18.1 10*3/uL — ABNORMAL HIGH (ref 1.7–7.7)
Neutrophils Relative %: 84 %
Platelets: 255 10*3/uL (ref 150–400)
RBC: 4.35 MIL/uL (ref 3.87–5.11)
RDW: 16.1 % — ABNORMAL HIGH (ref 11.5–15.5)
WBC: 21.5 10*3/uL — ABNORMAL HIGH (ref 4.0–10.5)
nRBC: 0 % (ref 0.0–0.2)

## 2023-04-10 LAB — GLUCOSE, CAPILLARY
Glucose-Capillary: 227 mg/dL — ABNORMAL HIGH (ref 70–99)
Glucose-Capillary: 207 mg/dL — ABNORMAL HIGH (ref 70–99)
Glucose-Capillary: 275 mg/dL — ABNORMAL HIGH (ref 70–99)
Glucose-Capillary: 208 mg/dL — ABNORMAL HIGH (ref 70–99)

## 2023-04-10 LAB — MAGNESIUM: Magnesium: 2.1 mg/dL (ref 1.7–2.4)

## 2023-04-10 MED ORDER — ORAL CARE MOUTH RINSE: 15.0000 mL | OROMUCOSAL | Status: DC | PRN

## 2023-04-10 MED ORDER — ALBUTEROL SULFATE (2.5 MG/3ML) 0.083% IN NEBU
2.5000 mg | INHALATION_SOLUTION | Freq: Four times a day (QID) | RESPIRATORY_TRACT | Status: DC
  Administered 2023-04-11 – 2023-04-12 (×5): 2.5 mg via RESPIRATORY_TRACT
  Filled 2023-04-10 (×6): qty 3

## 2023-04-10 MED ORDER — INSULIN GLARGINE-YFGN 100 UNIT/ML ~~LOC~~ SOLN
12.0000 [IU] | Freq: Every day | SUBCUTANEOUS | Status: DC
  Administered 2023-04-10 – 2023-04-11 (×2): 12 [IU] via SUBCUTANEOUS
  Filled 2023-04-10 (×2): qty 0.12

## 2023-04-10 NOTE — Progress Notes (Signed)
   04/10/23 2041  BiPAP/CPAP/SIPAP  $ Non-Invasive Home Ventilator  Initial  $ Face Mask Medium Yes  BiPAP/CPAP/SIPAP Pt Type Adult  BiPAP/CPAP/SIPAP DREAMSTATIOND  Mask Type Full face mask  Mask Size Medium  Flow Rate 4 lpm  Patient Home Equipment No  Auto Titrate Yes (5-20)  BiPAP/CPAP /SiPAP Vitals  Pulse Rate 77  Resp 18  SpO2 95 %  Bilateral Breath Sounds Diminished;Expiratory wheezes  MEWS Score/Color  MEWS Score 0  MEWS Score Color Brenda [name]

## 2023-04-10 NOTE — Plan of Care (Signed)
  Problem: Education: Goal: Knowledge of General Education information will improve Description: Including pain rating scale, medication(s)/side effects and non-pharmacologic comfort measures Outcome: Progressing   Problem: Health Behavior/Discharge Planning: Goal: Ability to manage health-related needs will improve Outcome: Progressing   Problem: Clinical Measurements: Goal: Will remain free from infection Outcome: Progressing   

## 2023-04-10 NOTE — Progress Notes (Signed)
Mobility Specialist - Progress Note   04/10/23 1507  Oxygen Therapy  SpO2 91 %  O2 Device Nasal Cannula  O2 Flow Rate (L/min) 3 L/min  Patient Activity (if Appropriate) Ambulating  Mobility  Activity Ambulated independently in hallway  Level of Assistance Independent  Assistive Device None  Distance Ambulated (ft) 250 ft  Activity Response Tolerated well  Mobility Referral Yes  $Mobility charge 1 Mobility  Mobility Specialist Start Time (ACUTE ONLY) 0259  Mobility Specialist Stop Time (ACUTE ONLY) 0309  Mobility Specialist Time Calculation (min) (ACUTE ONLY) 10 min   Pt received in bed and agreeable to mobility. Pt took x1 standing rest break d/t SOB. No complaints during session. Pt to EOB after session with all needs met.    Pre-mobility: 93% SpO2 (3L [place]) During mobility: 91% SpO2 (3L [place]) Post-mobility: 92%  SPO2 (3L )  Chief Technology Officer

## 2023-04-10 NOTE — Progress Notes (Signed)
PROGRESS NOTE    Brenda [name]  LOV:[id number] DOB: 06/08/1960 DOA: 04/03/2023 PCP: [name], MD    Chief Complaint  Patient presents with   Shortness of Breath    Brief Narrative:  62 y.o. female with medical history significant for coronary artery disease, chronic HFpEF, CKD 3A, COPD, former smoker, chronic hypoxic respiratory failure on 2 L nasal cannula, essential hypertension, type 2 diabetes, diabetic polyneuropathy, hyperlipidemia, GERD, severe morbid obesity, bipolar disorder/generalized anxiety disorder, admitted with worsening dyspnea coughing and wheezing for 2 days prior to admission.  Patient reports she has oxygen at home she uses continuously at 2 L.  In the ER she was hypoxic she required 4 L of oxygen to maintain her saturation above 92%.  She was tachypneic afebrile and hypoxic 95% on 4 L in the ER.     Assessment & Plan:   Principal Problem:   COPD with acute exacerbation (HCC) Active Problems:   Chronic hypoxic respiratory failure (HCC)   COPD exacerbation (HCC)   HYPERTRIGLYCERIDEMIA   Morbid obesity with BMI of 50.0-59.9, adult (HCC)   Leukocytosis   Ex-smoker   Essential hypertension   OSA on CPAP   Type 2 diabetes mellitus without complication (HCC)   Diastolic dysfunction   Unilateral primary osteoarthritis, left knee   Knee mass, right  #1 acute on chronic hypoxic respiratory failure secondary to acute COPD exacerbation -Patient presented with wheezing, shortness of breath, productive cough and increased O2 requirements requiring 4 L O2 with baseline O2 of 2 L nasal cannula. -Patient slowly improving clinically with improved wheezing and shortness of breath.  -SARS coronavirus 2 PCR negative. -Influenza A and B PCR negative. -RSV by PCR negative. -Respiratory viral panel negative. -Continue Brovana nebs, Pulmicort nebs, Flonase, Claritin, Pepcid. -Continue PPI, scheduled DuoNebs. -Continue IV Solu-Medrol to 60 mg every 12 hours and  decrease to 60 mg daily tomorrow.   -Incentive spirometry. -Supportive care.  2.  Leukocytosis -Likely secondary to steroids. -Patient afebrile. -Follow.  3.  Type 2 diabetes with hyperglycemia -Hemoglobin A1c 6.4 (10/20/2022) -Repeat hemoglobin A1c noted at 6.5.Marland Kitchen -CBG 227 this morning.  -Patient was on Trulicity prior to admission. -Patient currently on IV steroids. -Continue SSI.   -Increase Semglee to 12 units daily.      4.  HFpEF Currently euvolemic and stable on exam. -Continue home regimen aspirin, statin, Entresto, spironolactone. -BP somewhat borderline and as such we will continue to hold home regimen Lasix however could likely resume in the next 1 to 2 days.   -Continue to hold home regimen Toprol-XL with acute exacerbation of COPD.Marland Kitchen  5.  Hyperlipidemia -Continue statin.  6.  GERD -Continue PPI.   7.  GAD/bipolar disorder -Stable.   -Abilify, Lexapro.   8.  CKD stage IIIa -Stable.  9.  Obesity -BMI of 53.95 kg/m -Lifestyle modification -Outpatient follow-up with PCP.  10.  OSA -CPAP nightly.    DVT prophylaxis: Lovenox Code Status: Full Family Communication: Updated patient.  No family at bedside. Disposition: Likely home when clinically improved.  Status is: Inpatient Remains inpatient appropriate because: Severity of illness   Consultants:  None  Procedures:  Chest x-ray 04/03/2023   Antimicrobials:  Anti-infectives (From admission, onward)    Start     Dose/Rate Route Frequency Ordered Stop   04/05/23 0000  azithromycin (ZITHROMAX) tablet 500 mg        500 mg Oral Daily 04/04/23 1133     04/04/23 0000  azithromycin (ZITHROMAX) 500 mg in dextrose 5 %  250 mL IVPB  Status:  Discontinued        500 mg 250 mL/hr over 60 Minutes Intravenous Every 24 hours 04/03/23 2350 04/04/23 1133         Subjective: Sitting up in bed.  Feels shortness of breath and cough and wheezing slowly improving.  States feels suffocated with CPAP secondary to  mask.  Objective: Vitals:   04/10/23 0500 04/10/23 0546 04/10/23 0608 04/10/23 1215  BP:   121/73 117/70  Pulse:   81 84  Resp:   18 18  Temp:   98 F (36.7 C) 98.1 F (36.7 C)  TempSrc:   Oral Oral  SpO2:  95% 98% 98%  Weight: 123.8 kg     Height:        Intake/Output Summary (Last 24 hours) at 04/10/2023 1311 Last data filed at 04/10/2023 1145 Gross per 24 hour  Intake 200 ml  Output 1 ml  Net 199 ml    Filed Weights   04/07/23 0500 04/09/23 0500 04/10/23 0500  Weight: 124.9 kg 123.9 kg 123.8 kg    Examination:  General exam: NAD. Respiratory system: Decreased expiratory wheezing.  Improving coarse breath sounds.  No crackles.  Decreased upper airway noise.  Speaking in full sentences.  No use of accessory muscles of respiration.  Cardiovascular system: RRR no murmurs rubs or gallops.  No JVD.  No lower extremity edema.   Gastrointestinal system: Abdomen is soft, nontender, nondistended, obese, positive bowel sounds.  No rebound.  No guarding.  Central nervous system: Alert and oriented. No focal neurological deficits. Extremities: Symmetric 5 x 5 power. Skin: No rashes, lesions or ulcers Psychiatry: Judgement and insight appear normal. Mood & affect appropriate.     Data Reviewed: I have personally reviewed following labs and imaging studies  CBC: Recent Labs  Lab 04/04/23 0500 04/07/23 0511 04/08/23 0527 04/09/23 0603 04/10/23 0627  WBC 14.7* 16.8* 18.2* 22.4* 21.5*  NEUTROABS  --   --  16.4* 18.9* 18.1*  HGB 13.5 13.4 13.2 13.1 13.0  HCT 43.1 44.3 44.6 42.9 41.0  MCV 93.9 96.5 98.2 95.5 94.3  PLT 262 239 242 254 255    Basic Metabolic Panel: Recent Labs  Lab 04/04/23 0500 04/07/23 0511 04/08/23 0527 04/09/23 0603 04/10/23 0627  NA 139 141 136 135 135  K 4.4 4.0 4.4 4.6 4.4  CL 105 101 99 100 98  CO2 28 30 25 26 28   GLUCOSE 162* 130* 264* 229* 278*  BUN 14 20 25* 26* 28*  CREATININE 0.97 0.89 1.02* 0.90 1.02*  CALCIUM 8.8* 8.9 9.0 9.1 9.1   MG 2.7*  --  2.3  --  2.1  PHOS 2.8  --   --   --   --     GFR: Estimated Creatinine Clearance: 67.6 mL/min (A) (by C-G formula based on SCr of 1.02 mg/dL (H)).  Liver Function Tests: Recent Labs  Lab 04/07/23 0511  AST 16  ALT 21  ALKPHOS 98  BILITOT 0.5  PROT 7.2  ALBUMIN 3.4*    CBG: Recent Labs  Lab 04/09/23 1230 04/09/23 1632 04/09/23 2136 04/10/23 0750 04/10/23 1155  GLUCAP 207* 279* 250* 227* 207*     Recent Results (from the past 240 hour(s))  Resp panel by RT-PCR (RSV, Flu A&B, Covid) Anterior Nasal Swab     Status: None   Collection Time: 04/07/23 12:49 PM   Specimen: Anterior Nasal Swab  Result Value Ref Range Status   SARS Coronavirus 2 by  RT PCR NEGATIVE NEGATIVE Final    Comment: (NOTE) SARS-CoV-2 target nucleic acids are NOT DETECTED.  The SARS-CoV-2 RNA is generally detectable in upper respiratory specimens during the acute phase of infection. The lowest concentration of SARS-CoV-2 viral copies this assay can detect is 138 copies/mL. A negative result does not preclude SARS-Cov-2 infection and should not be used as the sole basis for treatment or other patient management decisions. A negative result may occur with  improper specimen collection/handling, submission of specimen other than nasopharyngeal swab, presence of viral mutation(s) within the areas targeted by this assay, and inadequate number of viral copies(<138 copies/mL). A negative result must be combined with clinical observations, patient history, and epidemiological information. The expected result is Negative.  Fact Sheet for Patients:  BloggerCourse.com  Fact Sheet for Healthcare Providers:  SeriousBroker.it  This test is no t yet approved or cleared by the [place] FDA and  has been authorized for detection and/or diagnosis of SARS-CoV-2 by FDA under an Emergency Use Authorization (EUA). This EUA will remain  in effect  (meaning this test can be used) for the duration of the COVID-19 declaration under Section 564(b)(1) of the Act, 21 U.S.C.section 360bbb-3(b)(1), unless the authorization is terminated  or revoked sooner.       Influenza A by PCR NEGATIVE NEGATIVE Final   Influenza B by PCR NEGATIVE NEGATIVE Final    Comment: (NOTE) The Xpert Xpress SARS-CoV-2/FLU/RSV plus assay is intended as an aid in the diagnosis of influenza from Nasopharyngeal swab specimens and should not be used as a sole basis for treatment. Nasal washings and aspirates are unacceptable for Xpert Xpress SARS-CoV-2/FLU/RSV testing.  Fact Sheet for Patients: BloggerCourse.com  Fact Sheet for Healthcare Providers: SeriousBroker.it  This test is not yet approved or cleared by the [place] FDA and has been authorized for detection and/or diagnosis of SARS-CoV-2 by FDA under an Emergency Use Authorization (EUA). This EUA will remain in effect (meaning this test can be used) for the duration of the COVID-19 declaration under Section 564(b)(1) of the Act, 21 U.S.C. section 360bbb-3(b)(1), unless the authorization is terminated or revoked.     Resp Syncytial Virus by PCR NEGATIVE NEGATIVE Final    Comment: (NOTE) Fact Sheet for Patients: BloggerCourse.com  Fact Sheet for Healthcare Providers: SeriousBroker.it  This test is not yet approved or cleared by the [place] FDA and has been authorized for detection and/or diagnosis of SARS-CoV-2 by FDA under an Emergency Use Authorization (EUA). This EUA will remain in effect (meaning this test can be used) for the duration of the COVID-19 declaration under Section 564(b)(1) of the Act, 21 U.S.C. section 360bbb-3(b)(1), unless the authorization is terminated or revoked.  Performed at [facility], [address], [place], [place] [address]    Respiratory (~20 pathogens) panel by PCR     Status: None   Collection Time: 04/07/23 12:49 PM  Result Value Ref Range Status   Adenovirus NOT DETECTED NOT DETECTED Final   Coronavirus 229E NOT DETECTED NOT DETECTED Final    Comment: (NOTE) The Coronavirus on the Respiratory Panel, DOES NOT test for the novel  Coronavirus (2019 nCoV)    Coronavirus HKU1 NOT DETECTED NOT DETECTED Final   Coronavirus NL63 NOT DETECTED NOT DETECTED Final   Coronavirus OC43 NOT DETECTED NOT DETECTED Final   Metapneumovirus NOT DETECTED NOT DETECTED Final   Rhinovirus / Enterovirus NOT DETECTED NOT DETECTED Final   Influenza A NOT DETECTED NOT DETECTED Final   Influenza B  NOT DETECTED NOT DETECTED Final   Parainfluenza Virus 1 NOT DETECTED NOT DETECTED Final   Parainfluenza Virus 2 NOT DETECTED NOT DETECTED Final   Parainfluenza Virus 3 NOT DETECTED NOT DETECTED Final   Parainfluenza Virus 4 NOT DETECTED NOT DETECTED Final   Respiratory Syncytial Virus NOT DETECTED NOT DETECTED Final   Bordetella pertussis NOT DETECTED NOT DETECTED Final   Bordetella Parapertussis NOT DETECTED NOT DETECTED Final   Chlamydophila pneumoniae NOT DETECTED NOT DETECTED Final   Mycoplasma pneumoniae NOT DETECTED NOT DETECTED Final    Comment: Performed at [facility], [address], [place], [place] [address]         Radiology Studies: No results found.      Scheduled Meds:  arformoterol  15 mcg Nebulization BID   ARIPiprazole  10 mg Oral Daily   aspirin EC  81 mg Oral Daily   atorvastatin  80 mg Oral Daily   azithromycin  500 mg Oral Daily   budesonide (PULMICORT) nebulizer solution  0.5 mg Nebulization BID   cholecalciferol  1,000 Units Oral Daily   enoxaparin (LOVENOX) injection  40 mg Subcutaneous Q24H   escitalopram  20 mg Oral Daily   famotidine  20 mg Oral Daily   fluticasone  2 spray Each Nare Daily   gabapentin  300 mg Oral Daily   And   gabapentin  300 mg Oral QHS   insulin aspart  0-20  Units Subcutaneous TID WC   insulin aspart  0-5 Units Subcutaneous QHS   insulin glargine-yfgn  12 Units Subcutaneous Daily   ipratropium-albuterol  3 mL Nebulization Q6H   loratadine  10 mg Oral Daily   methylPREDNISolone (SOLU-MEDROL) injection  60 mg Intravenous Q12H   multivitamin with minerals  1 tablet Oral Daily   pantoprazole  40 mg Oral QHS   sacubitril-valsartan  1 tablet Oral BID   spironolactone  25 mg Oral Daily   umeclidinium bromide  1 puff Inhalation Daily   Continuous Infusions:   LOS: 7 days    Time spent: 35 minutes    Ramiro Harvest, MD [facility]   To contact the attending provider between 7A-7P or the covering provider during after hours 7P-7A, please log into the web site [email or web address] and access using universal [place] password for that web site. If you do not have the password, please call the hospital operator.  04/10/2023, 1:11 PM

## 2023-04-11 DIAGNOSIS — I10 Essential (primary) hypertension: Secondary | ICD-10-CM | POA: Diagnosis not present

## 2023-04-11 DIAGNOSIS — J9611 Chronic respiratory failure with hypoxia: Secondary | ICD-10-CM | POA: Diagnosis not present

## 2023-04-11 DIAGNOSIS — D72829 Elevated white blood cell count, unspecified: Secondary | ICD-10-CM | POA: Diagnosis not present

## 2023-04-11 DIAGNOSIS — J441 Chronic obstructive pulmonary disease with (acute) exacerbation: Secondary | ICD-10-CM | POA: Diagnosis not present

## 2023-04-11 LAB — GLUCOSE, CAPILLARY
Glucose-Capillary: 253 mg/dL — ABNORMAL HIGH (ref 70–99)
Glucose-Capillary: 227 mg/dL — ABNORMAL HIGH (ref 70–99)
Glucose-Capillary: 274 mg/dL — ABNORMAL HIGH (ref 70–99)
Glucose-Capillary: 196 mg/dL — ABNORMAL HIGH (ref 70–99)

## 2023-04-11 MED ORDER — METHYLPREDNISOLONE SODIUM SUCC 125 MG IJ SOLR
60.0000 mg | Freq: Every day | INTRAMUSCULAR | Status: DC
Start: 2023-04-12 — End: 2023-04-11

## 2023-04-11 MED ORDER — PREDNISONE 20 MG PO TABS: 40.0000 mg | ORAL_TABLET | Freq: Every day | ORAL | Status: DC

## 2023-04-11 MED ORDER — INSULIN GLARGINE-YFGN 100 UNIT/ML ~~LOC~~ SOLN
4.0000 [IU] | Freq: Once | SUBCUTANEOUS | Status: AC
  Administered 2023-04-11: 4 [IU] via SUBCUTANEOUS
  Filled 2023-04-11: qty 0.04

## 2023-04-11 MED ORDER — INSULIN GLARGINE-YFGN 100 UNIT/ML ~~LOC~~ SOLN: 15.0000 [IU] | Freq: Every day | SUBCUTANEOUS | Status: DC

## 2023-04-11 MED ORDER — PREDNISONE 50 MG PO TABS
60.0000 mg | ORAL_TABLET | Freq: Every day | ORAL | Status: DC
  Administered 2023-04-12: 60 mg via ORAL
  Filled 2023-04-11: qty 1

## 2023-04-11 MED ORDER — INSULIN GLARGINE-YFGN 100 UNIT/ML ~~LOC~~ SOLN
16.0000 [IU] | Freq: Every day | SUBCUTANEOUS | Status: DC
  Administered 2023-04-12: 16 [IU] via SUBCUTANEOUS
  Filled 2023-04-11: qty 0.16

## 2023-04-11 NOTE — Progress Notes (Signed)
Mobility Specialist - Progress Note   04/11/23 1100  Mobility  Activity Ambulated with assistance in hallway  Level of Assistance Standby assist, set-up cues, supervision of patient - no hands on  Assistive Device None  Distance Ambulated (ft) 175 ft  Range of Motion/Exercises Active  Activity Response Tolerated well  Mobility Referral Yes  $Mobility charge 1 Mobility  Mobility Specialist Start Time (ACUTE ONLY) 1115  Mobility Specialist Stop Time (ACUTE ONLY) 1127  Mobility Specialist Time Calculation (min) (ACUTE ONLY) 12 min   Received in bed and agreed to mobility, difficulty breathing throughout session on 3L. SpO2% stayed around 95%, but continued to have very labored breathing.  Returned to bed with all needs met.  Marilynne Halsted Mobility Specialist

## 2023-04-11 NOTE — Progress Notes (Addendum)
PROGRESS NOTE    Brenda [name]  XBJ:[id number] DOB: 03/14/1961 DOA: 04/03/2023 PCP: [name], MD    Chief Complaint  Patient presents with   Shortness of Breath    Brief Narrative:  62 y.o. female with medical history significant for coronary artery disease, chronic HFpEF, CKD 3A, COPD, former smoker, chronic hypoxic respiratory failure on 2 L nasal cannula, essential hypertension, type 2 diabetes, diabetic polyneuropathy, hyperlipidemia, GERD, severe morbid obesity, bipolar disorder/generalized anxiety disorder, admitted with worsening dyspnea coughing and wheezing for 2 days prior to admission.  Patient reports she has oxygen at home she uses continuously at 2 L.  In the ER she was hypoxic she required 4 L of oxygen to maintain her saturation above 92%.  She was tachypneic afebrile and hypoxic 95% on 4 L in the ER.     Assessment & Plan:   Principal Problem:   COPD with acute exacerbation (HCC) Active Problems:   Chronic hypoxic respiratory failure (HCC)   COPD exacerbation (HCC)   HYPERTRIGLYCERIDEMIA   Morbid obesity with BMI of 50.0-59.9, adult (HCC)   Leukocytosis   Ex-smoker   Essential hypertension   OSA on CPAP   Type 2 diabetes mellitus without complication (HCC)   Diastolic dysfunction   Unilateral primary osteoarthritis, left knee   Knee mass, right  #1 acute on chronic hypoxic respiratory failure secondary to acute COPD exacerbation -Patient presented with wheezing, shortness of breath, productive cough and increased O2 requirements requiring 4 L O2 with baseline O2 of 2 L nasal cannula. -Clinical improvement.   -SARS coronavirus 2 PCR negative. -Influenza A and B PCR negative. -RSV by PCR negative. -Respiratory viral panel negative. -Continue Brovana nebs, Pulmicort nebs, Flonase, Claritin, Pepcid. -Continue PPI, scheduled DuoNebs. -Decrease IV Solu-Medrol to 60 mg daily and transition to oral prednisone taper tomorrow.   -DC  azithromycin. -Incentive spirometry. -Supportive care.  2.  Leukocytosis -Likely secondary to steroids. -Patient afebrile. -Follow.  3.  Type 2 diabetes with hyperglycemia -Hemoglobin A1c 6.4 (10/20/2022) -Repeat hemoglobin A1c noted at 6.5.Marland Kitchen -CBG 253 this morning.  -Patient was on Trulicity prior to admission. -Patient currently on IV steroids and will be tapered to oral steroids tomorrow.. -Continue SSI.   -Increase Semglee to 16 units daily.      4.  HFpEF Currently euvolemic and stable on exam. -Continue home regimen aspirin, statin, Entresto, spironolactone. -BP somewhat borderline and as such we will continue to hold home regimen Lasix.   -Continue to hold home regimen Toprol-XL with acute exacerbation of COPD and likely resume on discharge...  5.  Hyperlipidemia -Statin.  6.  GERD -PPI.    7.  GAD/bipolar disorder -Stable.   -Continue Abilify, Lexapro.   8.  CKD stage IIIa -Stable.  9.  Obesity -BMI of 53.95 kg/m -Lifestyle modification -Outpatient follow-up with PCP.  10.  OSA -Patient able to tolerate CPAP overnight.    DVT prophylaxis: Lovenox Code Status: Full Family Communication: Updated patient.  No family at bedside. Disposition: Likely home when clinically improved.  Status is: Inpatient Remains inpatient appropriate because: Severity of illness   Consultants:  None  Procedures:  Chest x-ray 04/03/2023   Antimicrobials:  Anti-infectives (From admission, onward)    Start     Dose/Rate Route Frequency Ordered Stop   04/05/23 0000  azithromycin (ZITHROMAX) tablet 500 mg        500 mg Oral Daily 04/04/23 1133     04/04/23 0000  azithromycin (ZITHROMAX) 500 mg in dextrose 5 % 250  mL IVPB  Status:  Discontinued        500 mg 250 mL/hr over 60 Minutes Intravenous Every 24 hours 04/03/23 2350 04/04/23 1133         Subjective: Sitting up in bed eating lunch.  Feels shortness of breath wheezing and cough improving.  Stated she was able  to use CPAP last night from 9 PM to 5 AM today.   Objective: Vitals:   04/10/23 2041 04/11/23 0500 04/11/23 0513 04/11/23 0559  BP:   127/74   Pulse: 77  83   Resp: 18  20   Temp:   98.6 F (37 C)   TempSrc:   Oral   SpO2: 95%  96% 95%  Weight:  123.2 kg    Height:        Intake/Output Summary (Last 24 hours) at 04/11/2023 1315 Last data filed at 04/10/2023 1800 Gross per 24 hour  Intake 240 ml  Output 1 ml  Net 239 ml    Filed Weights   04/09/23 0500 04/10/23 0500 04/11/23 0500  Weight: 123.9 kg 123.8 kg 123.2 kg    Examination:  General exam: NAD. Respiratory system: Minimal expiratory wheezing.  Improved coarse breath sounds.  No crackles.  Speaking in full sentences.  No use of accessory muscles of respiration. Cardiovascular system: Regular rate rhythm no murmurs rubs or gallops.  No JVD.  No lower extremity edema.  Gastrointestinal system: Abdomen is soft, nontender, nondistended, obese, positive bowel sounds.  No rebound.  No guarding.  Central nervous system: Alert and oriented. No focal neurological deficits. Extremities: Symmetric 5 x 5 power. Skin: No rashes, lesions or ulcers Psychiatry: Judgement and insight appear normal. Mood & affect appropriate.     Data Reviewed: I have personally reviewed following labs and imaging studies  CBC: Recent Labs  Lab 04/07/23 0511 04/08/23 0527 04/09/23 0603 04/10/23 0627  WBC 16.8* 18.2* 22.4* 21.5*  NEUTROABS  --  16.4* 18.9* 18.1*  HGB 13.4 13.2 13.1 13.0  HCT 44.3 44.6 42.9 41.0  MCV 96.5 98.2 95.5 94.3  PLT 239 242 254 255    Basic Metabolic Panel: Recent Labs  Lab 04/07/23 0511 04/08/23 0527 04/09/23 0603 04/10/23 0627  NA 141 136 135 135  K 4.0 4.4 4.6 4.4  CL 101 99 100 98  CO2 30 25 26 28   GLUCOSE 130* 264* 229* 278*  BUN 20 25* 26* 28*  CREATININE 0.89 1.02* 0.90 1.02*  CALCIUM 8.9 9.0 9.1 9.1  MG  --  2.3  --  2.1    GFR: Estimated Creatinine Clearance: 67.4 mL/min (A) (by C-G  formula based on SCr of 1.02 mg/dL (H)).  Liver Function Tests: Recent Labs  Lab 04/07/23 0511  AST 16  ALT 21  ALKPHOS 98  BILITOT 0.5  PROT 7.2  ALBUMIN 3.4*    CBG: Recent Labs  Lab 04/10/23 1155 04/10/23 1646 04/10/23 2027 04/11/23 0738 04/11/23 1153  GLUCAP 207* 275* 208* 253* 227*     Recent Results (from the past 240 hour(s))  Resp panel by RT-PCR (RSV, Flu A&B, Covid) Anterior Nasal Swab     Status: None   Collection Time: 04/07/23 12:49 PM   Specimen: Anterior Nasal Swab  Result Value Ref Range Status   SARS Coronavirus 2 by RT PCR NEGATIVE NEGATIVE Final    Comment: (NOTE) SARS-CoV-2 target nucleic acids are NOT DETECTED.  The SARS-CoV-2 RNA is generally detectable in upper respiratory specimens during the acute phase of infection. The lowest  concentration of SARS-CoV-2 viral copies this assay can detect is 138 copies/mL. A negative result does not preclude SARS-Cov-2 infection and should not be used as the sole basis for treatment or other patient management decisions. A negative result may occur with  improper specimen collection/handling, submission of specimen other than nasopharyngeal swab, presence of viral mutation(s) within the areas targeted by this assay, and inadequate number of viral copies(<138 copies/mL). A negative result must be combined with clinical observations, patient history, and epidemiological information. The expected result is Negative.  Fact Sheet for Patients:  BloggerCourse.com  Fact Sheet for Healthcare Providers:  SeriousBroker.it  This test is no t yet approved or cleared by the [place] FDA and  has been authorized for detection and/or diagnosis of SARS-CoV-2 by FDA under an Emergency Use Authorization (EUA). This EUA will remain  in effect (meaning this test can be used) for the duration of the COVID-19 declaration under Section 564(b)(1) of the Act,  21 U.S.C.section 360bbb-3(b)(1), unless the authorization is terminated  or revoked sooner.       Influenza A by PCR NEGATIVE NEGATIVE Final   Influenza B by PCR NEGATIVE NEGATIVE Final    Comment: (NOTE) The Xpert Xpress SARS-CoV-2/FLU/RSV plus assay is intended as an aid in the diagnosis of influenza from Nasopharyngeal swab specimens and should not be used as a sole basis for treatment. Nasal washings and aspirates are unacceptable for Xpert Xpress SARS-CoV-2/FLU/RSV testing.  Fact Sheet for Patients: BloggerCourse.com  Fact Sheet for Healthcare Providers: SeriousBroker.it  This test is not yet approved or cleared by the [place] FDA and has been authorized for detection and/or diagnosis of SARS-CoV-2 by FDA under an Emergency Use Authorization (EUA). This EUA will remain in effect (meaning this test can be used) for the duration of the COVID-19 declaration under Section 564(b)(1) of the Act, 21 U.S.C. section 360bbb-3(b)(1), unless the authorization is terminated or revoked.     Resp Syncytial Virus by PCR NEGATIVE NEGATIVE Final    Comment: (NOTE) Fact Sheet for Patients: BloggerCourse.com  Fact Sheet for Healthcare Providers: SeriousBroker.it  This test is not yet approved or cleared by the [place] FDA and has been authorized for detection and/or diagnosis of SARS-CoV-2 by FDA under an Emergency Use Authorization (EUA). This EUA will remain in effect (meaning this test can be used) for the duration of the COVID-19 declaration under Section 564(b)(1) of the Act, 21 U.S.C. section 360bbb-3(b)(1), unless the authorization is terminated or revoked.  Performed at [facility], [address] [place], [place] [address]   Respiratory (~20 pathogens) panel by PCR     Status: None   Collection Time: 04/07/23 12:49 PM  Result Value Ref Range  Status   Adenovirus NOT DETECTED NOT DETECTED Final   Coronavirus 229E NOT DETECTED NOT DETECTED Final    Comment: (NOTE) The Coronavirus on the Respiratory Panel, DOES NOT test for the novel  Coronavirus (2019 nCoV)    Coronavirus HKU1 NOT DETECTED NOT DETECTED Final   Coronavirus NL63 NOT DETECTED NOT DETECTED Final   Coronavirus OC43 NOT DETECTED NOT DETECTED Final   Metapneumovirus NOT DETECTED NOT DETECTED Final   Rhinovirus / Enterovirus NOT DETECTED NOT DETECTED Final   Influenza A NOT DETECTED NOT DETECTED Final   Influenza B NOT DETECTED NOT DETECTED Final   Parainfluenza Virus 1 NOT DETECTED NOT DETECTED Final   Parainfluenza Virus 2 NOT DETECTED NOT DETECTED Final   Parainfluenza Virus 3 NOT DETECTED NOT DETECTED Final  Parainfluenza Virus 4 NOT DETECTED NOT DETECTED Final   Respiratory Syncytial Virus NOT DETECTED NOT DETECTED Final   Bordetella pertussis NOT DETECTED NOT DETECTED Final   Bordetella Parapertussis NOT DETECTED NOT DETECTED Final   Chlamydophila pneumoniae NOT DETECTED NOT DETECTED Final   Mycoplasma pneumoniae NOT DETECTED NOT DETECTED Final    Comment: Performed at [facility], [address], [place], [place] [address]         Radiology Studies: No results found.      Scheduled Meds:  albuterol  2.5 mg Nebulization Q6H WA   arformoterol  15 mcg Nebulization BID   ARIPiprazole  10 mg Oral Daily   aspirin EC  81 mg Oral Daily   atorvastatin  80 mg Oral Daily   azithromycin  500 mg Oral Daily   budesonide (PULMICORT) nebulizer solution  0.5 mg Nebulization BID   cholecalciferol  1,000 Units Oral Daily   enoxaparin (LOVENOX) injection  40 mg Subcutaneous Q24H   escitalopram  20 mg Oral Daily   famotidine  20 mg Oral Daily   fluticasone  2 spray Each Nare Daily   gabapentin  300 mg Oral Daily   And   gabapentin  300 mg Oral QHS   insulin aspart  0-20 Units Subcutaneous TID WC   insulin aspart  0-5 Units Subcutaneous QHS   [START  ON 04/12/2023] insulin glargine-yfgn  16 Units Subcutaneous Daily   loratadine  10 mg Oral Daily   [START ON 04/12/2023] methylPREDNISolone (SOLU-MEDROL) injection  60 mg Intravenous Daily   multivitamin with minerals  1 tablet Oral Daily   pantoprazole  40 mg Oral QHS   sacubitril-valsartan  1 tablet Oral BID   spironolactone  25 mg Oral Daily   umeclidinium bromide  1 puff Inhalation Daily   Continuous Infusions:   LOS: 8 days    Time spent: 35 minutes    Ramiro Harvest, MD [facility]   To contact the attending provider between 7A-7P or the covering provider during after hours 7P-7A, please log into the web site [email or web address] and access using universal [place] password for that web site. If you do not have the password, please call the hospital operator.  04/11/2023, 1:15 PM

## 2023-04-11 NOTE — Plan of Care (Signed)
  Problem: Education: Goal: Knowledge of General Education information will improve Description: Including pain rating scale, medication(s)/side effects and non-pharmacologic comfort measures Outcome: Progressing   Problem: Health Behavior/Discharge Planning: Goal: Ability to manage health-related needs will improve Outcome: Progressing   Problem: Clinical Measurements: Goal: Will remain free from infection Outcome: Progressing Goal: Diagnostic test results will improve Outcome: Progressing   Problem: Activity: Goal: Risk for activity intolerance will decrease Outcome: Progressing   Problem: Safety: Goal: Ability to remain free from injury will improve Outcome: Progressing

## 2023-04-12 ENCOUNTER — Other Ambulatory Visit (HOSPITAL_COMMUNITY): Payer: Self-pay

## 2023-04-12 DIAGNOSIS — I5189 Other ill-defined heart diseases: Secondary | ICD-10-CM | POA: Diagnosis not present

## 2023-04-12 DIAGNOSIS — J9611 Chronic respiratory failure with hypoxia: Secondary | ICD-10-CM | POA: Diagnosis not present

## 2023-04-12 DIAGNOSIS — J441 Chronic obstructive pulmonary disease with (acute) exacerbation: Secondary | ICD-10-CM | POA: Diagnosis not present

## 2023-04-12 LAB — BASIC METABOLIC PANEL
Anion gap: 8 (ref 5–15)
BUN: 29 mg/dL — ABNORMAL HIGH (ref 8–23)
CO2: 26 mmol/L (ref 22–32)
Calcium: 8.6 mg/dL — ABNORMAL LOW (ref 8.9–10.3)
Chloride: 100 mmol/L (ref 98–111)
Creatinine, Ser: 1.01 mg/dL — ABNORMAL HIGH (ref 0.44–1.00)
GFR, Estimated: >60 mL/min (ref >60)
Glucose, Bld: 139 mg/dL — ABNORMAL HIGH (ref 70–99)
Potassium: 4 mmol/L (ref 3.5–5.1)
Sodium: 134 mmol/L — ABNORMAL LOW (ref 135–145)

## 2023-04-12 LAB — CBC
HCT: 44.1 % (ref 36.0–46.0)
Hemoglobin: 13.4 g/dL (ref 12.0–15.0)
MCH: 29.5 pg (ref 26.0–34.0)
MCHC: 30.4 g/dL (ref 30.0–36.0)
MCV: 97.1 fL (ref 80.0–100.0)
Platelets: 226 10*3/uL (ref 150–400)
RBC: 4.54 MIL/uL (ref 3.87–5.11)
RDW: 16.4 % — ABNORMAL HIGH (ref 11.5–15.5)
WBC: 16.9 10*3/uL — ABNORMAL HIGH (ref 4.0–10.5)
nRBC: 0 % (ref 0.0–0.2)

## 2023-04-12 LAB — GLUCOSE, CAPILLARY
Glucose-Capillary: 134 mg/dL — ABNORMAL HIGH (ref 70–99)
Glucose-Capillary: 133 mg/dL — ABNORMAL HIGH (ref 70–99)

## 2023-04-12 MED ORDER — GABAPENTIN 300 MG PO CAPS
600.0000 mg | ORAL_CAPSULE | Freq: Every day | ORAL | Status: DC
  Administered 2023-04-12: 600 mg via ORAL
  Filled 2023-04-12: qty 2

## 2023-04-12 MED ORDER — PREDNISONE 20 MG PO TABS
ORAL_TABLET | ORAL | 0 refills | Status: AC
  Filled 2023-04-12: qty 18, 9d supply, fill #0

## 2023-04-12 MED ORDER — GABAPENTIN 300 MG PO CAPS: 900.0000 mg | ORAL_CAPSULE | Freq: Every day | ORAL | Status: DC

## 2023-04-12 MED ORDER — ALBUTEROL SULFATE (2.5 MG/3ML) 0.083% IN NEBU
INHALATION_SOLUTION | RESPIRATORY_TRACT | 0 refills | Status: DC
  Filled 2023-04-12: qty 90, 7d supply, fill #0

## 2023-04-12 NOTE — Progress Notes (Signed)
Physical Therapy Treatment Patient Details Name: Brenda [name] MRN: [id number] DOB: 06/16/1960 Today's Date: 04/12/2023   History of Present Illness Patient is a 62 year old female who presented to the ED 04/03/23  from home due to worsening dyspnea with minimal exertion, coughing, and wheezing for the past 2 days.  No reported subjective fevers or chills. Admitted with COPD exacerbation. PMH: coronary artery disease, chronic HFpEF, CKD 3A, COPD, former smoker, chronic hypoxic respiratory failure on 2 L nasal cannula, essential hypertension, type 2 diabetes, diabetic polyneuropathy, hyperlipidemia, GERD, severe morbid obesity, bipolar disorder/generalized anxiety disorder,    PT Comments  Pt making good progress.  Reports breathing feeling better and she is back on baseline 2 L O2.  She was able to ambulate 300' with 2 standing rest breaks.  Her sats maintained >93% during session.  Pt reports nearing baseline.  Will maintain on caseload to advance while hospitalized but likely no therapy needs at d/c.      If plan is discharge home, recommend the following: Assistance with cooking/housework;Help with stairs or ramp for entrance   Can travel by private vehicle        Equipment Recommendations  None recommended by PT    Recommendations for Other Services       Precautions / Restrictions Precautions Precautions: Fall Precaution Comments: monitor O2, on 2 L at baseline     Mobility  Bed Mobility Overal bed mobility: Independent       Supine to sit: Independent          Transfers Overall transfer level: Independent Equipment used: None Transfers: Sit to/from Stand Sit to Stand: Independent           General transfer comment: Has been walking in room independently. Demonstrated safely with PT    Ambulation/Gait Ambulation/Gait assistance: Supervision Gait Distance (Feet): 300 Feet Assistive device: None Gait Pattern/deviations: Step-through pattern        General Gait Details: 300' with 2 standing rest breaks.  Steady gait at controlled speed. She was on 2 L O2 with sats 93-96% and DOE of 2/4.  Reports breathing feeling better   Stairs             Wheelchair Mobility     Tilt Bed    Modified Rankin (Stroke Patients Only)       Balance Overall balance assessment: Modified Independent Sitting-balance support: No upper extremity supported Sitting balance-Leahy Scale: Normal     Standing balance support: No upper extremity supported Standing balance-Leahy Scale: Good                              Cognition Arousal: Alert Behavior During Therapy: WFL for tasks assessed/performed Overall Cognitive Status: Within Functional Limits for tasks assessed                                          Exercises      General Comments General comments (skin integrity, edema, etc.): Pt on 2 L O2 with sats 93-96% rest and activity.  She was educated on energy conservation, rest breaks as needed, gradually increasing endurance.      Pertinent Vitals/Pain Pain Assessment Pain Assessment: No/denies pain    Home Living  Prior Function            PT Goals (current goals can now be found in the care plan section) Progress towards PT goals: Progressing toward goals    Frequency    Min 1X/week      PT Plan      Co-evaluation              AM-PAC PT "6 Clicks" Mobility   Outcome Measure  Help needed turning from your back to your side while in a flat bed without using bedrails?: None Help needed moving from lying on your back to sitting on the side of a flat bed without using bedrails?: None Help needed moving to and from a bed to a chair (including a wheelchair)?: None Help needed standing up from a chair using your arms (e.g., wheelchair or bedside chair)?: None Help needed to walk in hospital room?: None Help needed climbing 3-5 steps with a railing?  : A Little 6 Click Score: 23    End of Session Equipment Utilized During Treatment: Gait belt Activity Tolerance: Patient tolerated treatment well Patient left: in bed;with call bell/phone within reach Nurse Communication: Mobility status PT Visit Diagnosis: Difficulty in walking, not elsewhere classified (R26.2)     Time: 1610-9604 PT Time Calculation (min) (ACUTE ONLY): 17 min  Charges:    $Gait Training: 8-22 mins PT General Charges $$ ACUTE PT VISIT: 1 Visit                     Anise Salvo, PT Acute Rehab Services [facility] [phone]    Rayetta Humphrey 04/12/2023, 10:50 AM

## 2023-04-12 NOTE — TOC Transition Note (Signed)
Transition of Care [facility]) - CM/SW Discharge Note   Patient Details  Name: Brenda [name] MRN: [id number] Date of Birth: 09-Jan-1961  Transition of Care [facility]) CM/SW Contact:  Lanier Clam, RN Phone Number: 04/12/2023, 2:58 PM   Clinical Narrative:  Spoke to patient about d/c plans. She declines HHC & otpt PT. Has own transport home. No further CM needs.     Final next level of care: Home/Self Care Barriers to Discharge: No Barriers Identified   Patient Goals and CMS Choice      Discharge Placement                         Discharge Plan and Services Additional resources added to the After Visit Summary for                                       Social Determinants of Health (SDOH) Interventions SDOH Screenings   Food Insecurity: No Food Insecurity (04/04/2023)  Housing: Low Risk  (04/04/2023)  Transportation Needs: No Transportation Needs (04/04/2023)  Utilities: Not At Risk (04/04/2023)  Alcohol Screen: Low Risk  (10/21/2022)  Depression (PHQ2-9): Low Risk  (03/16/2023)  Financial Resource Strain: Low Risk  (10/21/2022)  Physical Activity: Insufficiently Active (10/21/2022)  Social Connections: Socially Isolated (10/21/2022)  Stress: No Stress Concern Present (10/21/2022)  Tobacco Use: Medium Risk (04/03/2023)     Readmission Risk Interventions    04/08/2023   10:42 AM 04/06/2023    7:43 PM 02/26/2023    3:38 PM  Readmission Risk Prevention Plan  Transportation Screening Complete Complete Complete  PCP or Specialist Appt within 3-5 Days  Complete Complete  HRI or Home Care Consult  Complete Complete  Social Work Consult for Recovery Care Planning/Counseling  Complete Complete  Palliative Care Screening  Not Applicable Not Applicable  Medication Review [employer]) Complete Referral to Pharmacy Complete  PCP or Specialist appointment within 3-5 days of discharge Complete    HRI or Home Care Consult Complete    SW Recovery Care/Counseling Consult  Complete    Palliative Care Screening Not Applicable    Skilled Nursing Facility Not Applicable

## 2023-04-12 NOTE — Plan of Care (Signed)
  Problem: Education: Goal: Knowledge of General Education information will improve Description: Including pain rating scale, medication(s)/side effects and non-pharmacologic comfort measures Outcome: Completed/Met   Problem: Health Behavior/Discharge Planning: Goal: Ability to manage health-related needs will improve Outcome: Completed/Met   Problem: Clinical Measurements: Goal: Will remain free from infection Outcome: Completed/Met Goal: Diagnostic test results will improve Outcome: Completed/Met   Problem: Activity: Goal: Risk for activity intolerance will decrease Outcome: Completed/Met   Problem: Safety: Goal: Ability to remain free from injury will improve Outcome: Completed/Met   Problem: Education: Goal: Knowledge of disease or condition will improve Outcome: Completed/Met Goal: Knowledge of the prescribed therapeutic regimen will improve Outcome: Completed/Met Goal: Individualized Educational Video(s) Outcome: Completed/Met   Problem: Activity: Goal: Ability to tolerate increased activity will improve Outcome: Completed/Met Goal: Will verbalize the importance of balancing activity with adequate rest periods Outcome: Completed/Met   Problem: Respiratory: Goal: Ability to maintain a clear airway will improve Outcome: Completed/Met Goal: Levels of oxygenation will improve Outcome: Completed/Met Goal: Ability to maintain adequate ventilation will improve Outcome: Completed/Met

## 2023-04-12 NOTE — Discharge Summary (Signed)
Physician Discharge Summary  Brenda [name] BJY:[id number] DOB: 04/03/1961 DOA: 04/03/2023  PCP: [name], MD  Admit date: 04/03/2023 Discharge date: 04/12/2023  Time spent: 60 minutes  Recommendations for Outpatient Follow-up:  Follow-up with [name], MD in 2 weeks.  On follow-up patient will need a basic metabolic profile done to follow-up on electrolytes and renal function.  Patient COPD will need to be followed up upon.  Patient's diabetes will need to be followed up upon. Follow-up with Dr. Sherene Sires, pulmonary in 3 to 4 weeks.   Discharge Diagnoses:  Principal Problem:   Acute on chronic hypoxic respiratory failure (HCC) Active Problems:   Chronic hypoxic respiratory failure (HCC)   COPD exacerbation (HCC)   COPD with acute exacerbation (HCC)   HYPERTRIGLYCERIDEMIA   Morbid obesity with BMI of 50.0-59.9, adult (HCC)   Leukocytosis   Ex-smoker   Essential hypertension   OSA on CPAP   Type 2 diabetes mellitus without complication (HCC)   Diastolic dysfunction   Unilateral primary osteoarthritis, left knee   Knee mass, right   Discharge Condition: Stable and improved.  Diet recommendation: Carb modified diet  Filed Weights   04/10/23 0500 04/11/23 0500 04/12/23 0131  Weight: 123.8 kg 123.2 kg 125.1 kg    History of present illness:  HPI per Dr. Fransico Setters is a 62 y.o. female with medical history significant for coronary artery disease, chronic HFpEF, CKD 3A, COPD, former smoker, chronic hypoxic respiratory failure on 2 L nasal cannula, essential hypertension, type 2 diabetes, diabetic polyneuropathy, hyperlipidemia, GERD, severe morbid obesity, bipolar disorder/generalized anxiety disorder, who presented to the ED from home due to worsening dyspnea with minimal exertion, coughing, and wheezing for the past 2 days.  No reported subjective fevers or chills.   In the ED, hypoxic with O2 requirement above baseline of 2 L.  Chest x-ray was nonacute.   Audible wheezing noted.  The patient received bronchodilators, IV magnesium, and IV steroids.  TRH, hospitalist service, was asked to admit.   ED Course: Temperature 98.6.  BP 156/93, pulse 87, respiratory 27, saturation 95% on 4 L.  WBC 15.3, serum glucose 132, creatinine 1.14, GFR 54.  Hospital Course:  #1 acute on chronic hypoxic respiratory failure secondary to acute COPD exacerbation -Patient presented with wheezing, shortness of breath, productive cough and increased O2 requirements requiring 4 L O2 with baseline O2 of 2 L nasal cannula.  -SARS coronavirus 2 PCR negative. -Influenza A and B PCR negative. -RSV by PCR negative. -Respiratory viral panel negative. -Patient maintained on Brovana nebs, Pulmicort nebs, Flonase, Claritin, Pepcid, PPI, scheduled DuoNebs/albuterol nebs. -Patient also placed on IV Solu-Medrol and as patient improved clinically IV Solu-Medrol was tapered down to oral prednisone. -Patient will be discharged on a prednisone taper. -Patient received a full course of azithromycin during the hospitalization. -Patient improved clinically and was back to her home O2 baseline of 2 L nasal cannula by day of discharge. -Patient will be discharged in stable and improved condition.   2.  Leukocytosis -Likely secondary to steroids. -Patient afebrile. -Follow.   3.  Type 2 diabetes with hyperglycemia -Hemoglobin A1c 6.4 (10/20/2022) -Repeat hemoglobin A1c noted at 6.5.Marland Kitchen -Patient noted to be on Trulicity and Jardiance prior to admission which was held during the hospitalization and patient maintained on sliding scale insulin and long-acting Semglee which was uptitrated for better blood glucose control while on IV steroids.   -CBG improved with insulin and titration of steroids.   -Outpatient follow-up with PCP.  4.  HFpEF During the hospitalization remained euvolemic and stable on exam. -Patient maintained on home regimen aspirin, statin, Entresto, spironolactone. -BP  somewhat borderline and as such home regimen Lasix was held but will be resumed on discharge.   -Toprol-XL was held during the hospitalization due to acute exacerbation of COPD and will be resumed on discharge.    5.  Hyperlipidemia -Patient maintained on statin.   6.  GERD -Patient maintained on PPI.     7.  GAD/bipolar disorder -Stable.   -Patient maintained on home regimen Abilify, Lexapro.    8.  CKD stage IIIa -Stable.   9.  Obesity -BMI of 53.95 kg/m -Lifestyle modification -Outpatient follow-up with PCP.   10.  OSA -Patient able to tolerate CPAP during the hospitalization. -Outpatient follow-up.  11.  Morbid obesity -BMI of 53.86 kg/m. -Lifestyle modification. -Outpatient follow-up.    Procedures: Chest x-ray 04/03/2023  Consultations: None  Discharge Exam: Vitals:   04/12/23 1151 04/12/23 1319  BP: (!) 116/58   Pulse: 71   Resp:    Temp: 98.1 F (36.7 C)   SpO2: 97% 95%    General: NAD Cardiovascular: RRR no murmurs rubs or gallops.  No JVD.  No pitting lower extremity edema. Respiratory: Minimal expiratory wheezing.  No rhonchi.  No crackles.  Fair air movement.  Speaking in full sentences.  Discharge Instructions   Discharge Instructions     Diet Carb Modified   Complete by: As directed    Increase activity slowly   Complete by: As directed       Allergies as of 04/12/2023       Reactions   Metolazone Other (See Comments)   Metabolic mood swings        Medication List     TAKE these medications    albuterol 108 (90 Base) MCG/ACT inhaler Commonly known as: VENTOLIN HFA TAKE 2 PUFFS BY MOUTH EVERY 6 HOURS AS NEEDED FOR WHEEZE OR SHORTNESS OF BREATH What changed: Another medication with the same name was changed. Make sure you understand how and when to take each.   albuterol (2.5 MG/3ML) 0.083% nebulizer solution Commonly known as: PROVENTIL Take 3 mLs (2.5 mg total) by nebulization 3 (three) times daily for 4 days, THEN 3  mLs (2.5 mg total) every 4 (four) hours as needed for wheezing or shortness of breath. Start taking on: April 12, 2023 What changed: See the new instructions.   ARIPiprazole 10 MG tablet Commonly known as: ABILIFY Take 1 tablet (10 mg total) by mouth daily.   aspirin EC 81 MG tablet Take 1 tablet (81 mg total) by mouth daily. Swallow whole.   atorvastatin 80 MG tablet Commonly known as: LIPITOR TAKE 1 TABLET BY MOUTH EVERYDAY AT BEDTIME For cholesterol   Breztri Aerosphere 160-9-4.8 MCG/ACT Aero Generic drug: Budeson-Glycopyrrol-Formoterol Inhale 2 puffs into the lungs in the morning and at bedtime.   cetirizine-pseudoephedrine 5-120 MG tablet Commonly known as: ZYRTEC-D Take 1 tablet by mouth every evening.   dextromethorphan-guaiFENesin 30-600 MG 12hr tablet Commonly known as: MUCINEX DM Take 1 tablet by mouth 2 (two) times daily as needed for cough.   Entresto 24-26 MG Generic drug: sacubitril-valsartan Take 1 tablet by mouth 2 (two) times daily.   escitalopram 20 MG tablet Commonly known as: LEXAPRO Take 1 tablet (20 mg total) by mouth daily.   famotidine 20 MG tablet Commonly known as: Pepcid One after supper What changed:  how much to take how to take this when to take  this additional instructions   fluticasone 50 MCG/ACT nasal spray Commonly known as: FLONASE PLACE 1 SPRAY INTO BOTH NOSTRILS 2 (TWO) TIMES DAILY   furosemide 20 MG tablet Commonly known as: LASIX TAKE 1 TABLET BY MOUTH EVERY DAY   gabapentin 300 MG capsule Commonly known as: NEURONTIN Take 2 capsules (600 mg total) by mouth in the morning AND 3 capsules (900 mg total) at bedtime.   Jardiance 10 MG Tabs tablet Generic drug: empagliflozin TAKE 1 TABLET BY MOUTH EVERY DAY BEFORE BREAKFAST What changed: See the new instructions.   LORazepam 0.5 MG tablet Commonly known as: ATIVAN TAKE 1/2 TO 1 TABLET BY MOUTH TWICE A DAY AS NEEDED FOR ANXIETY   metoprolol succinate 25 MG 24 hr  tablet Commonly known as: TOPROL-XL Take 1 tablet (25 mg total) by mouth daily.   multivitamin with minerals Tabs tablet Take 1 tablet by mouth daily.   nitroGLYCERIN 0.4 MG SL tablet Commonly known as: Nitrostat Place 1 tablet (0.4 mg total) under the tongue every 5 (five) minutes as needed.   pantoprazole 40 MG tablet Commonly known as: PROTONIX TAKE 1 TABLET BY MOUTH 2 TIMES DAILY 30-60 MIN BEFORE FIRST MEAL OF THE DAY What changed: See the new instructions.   predniSONE 20 MG tablet Commonly known as: DELTASONE Take 3 tablets (60 mg total) by mouth daily before breakfast for 3 days, THEN 2 tablets (40 mg total) daily before breakfast for 3 days, THEN 1 tablet (20 mg total) daily before breakfast for 3 days. Start taking on: April 13, 2023   spironolactone 25 MG tablet Commonly known as: ALDACTONE Take 1 tablet (25 mg total) by mouth daily.   traMADol 50 MG tablet Commonly known as: ULTRAM Take 1 tablet (50 mg total) by mouth 2 (two) times daily as needed for moderate pain.   Trulicity 0.75 MG/0.5ML Soaj Generic drug: Dulaglutide Inject 0.75 mg into the skin once a week.   Vitamin D3 25 MCG (1000 UT) Caps Take 1 capsule (1,000 Units total) by mouth daily.       Allergies  Allergen Reactions   Metolazone Other (See Comments)    Metabolic mood swings    Follow-up Information     [name], MD. Schedule an appointment as soon as possible for a visit in 2 week(s).   Specialty: Family Medicine Contact information: [address] [place] [address] [phone]         Nyoka Cowden, MD. Schedule an appointment as soon as possible for a visit in 3 week(s).   Specialty: Pulmonary Disease Why: Follow-up in 3 to 4 weeks. Contact information: [address] [place] [address] [phone]                  The results of significant diagnostics from this hospitalization (including imaging, microbiology, ancillary  and laboratory) are listed below for reference.    Significant Diagnostic Studies: DG Chest 2 View  Result Date: 04/08/2023 CLINICAL DATA:  Hypoxia. EXAM: CHEST - 2 VIEW COMPARISON:  April 03, 2023. FINDINGS: The heart size and mediastinal contours are within normal limits. Right lung is clear. Minimal left basilar subsegmental atelectasis is noted. The visualized skeletal structures are unremarkable. IMPRESSION: Minimal left basilar subsegmental atelectasis. Electronically Signed   By: Lupita Raider M.D.   On: 04/08/2023 13:25   DG Chest Port 1 View  Result Date: 04/03/2023 CLINICAL DATA:  Shortness of breath EXAM: PORTABLE CHEST 1 VIEW COMPARISON:  02/24/2023  FINDINGS: Loop recorder device remains in place, unchanged. Heart and mediastinal contours are within normal limits. No focal opacities or effusions. No acute bony abnormality. IMPRESSION: No active disease. Electronically Signed   By: Charlett Nose M.D.   On: 04/03/2023 22:05   CUP PACEART REMOTE DEVICE CHECK  Result Date: 03/22/2023 ILR summary report received. Battery status OK. Normal device function. No new symptom, tachy, brady, or pause episodes. No new AF episodes.  AF burden is 0% of the time.   Monthly summary reports and ROV/PRN ML, CVRS   Microbiology: Recent Results (from the past 240 hour(s))  Resp panel by RT-PCR (RSV, Flu A&B, Covid) Anterior Nasal Swab     Status: None   Collection Time: 04/07/23 12:49 PM   Specimen: Anterior Nasal Swab  Result Value Ref Range Status   SARS Coronavirus 2 by RT PCR NEGATIVE NEGATIVE Final    Comment: (NOTE) SARS-CoV-2 target nucleic acids are NOT DETECTED.  The SARS-CoV-2 RNA is generally detectable in upper respiratory specimens during the acute phase of infection. The lowest concentration of SARS-CoV-2 viral copies this assay can detect is 138 copies/mL. A negative result does not preclude SARS-Cov-2 infection and should not be used as the sole basis for treatment or other  patient management decisions. A negative result may occur with  improper specimen collection/handling, submission of specimen other than nasopharyngeal swab, presence of viral mutation(s) within the areas targeted by this assay, and inadequate number of viral copies(<138 copies/mL). A negative result must be combined with clinical observations, patient history, and epidemiological information. The expected result is Negative.  Fact Sheet for Patients:  BloggerCourse.com  Fact Sheet for Healthcare Providers:  SeriousBroker.it  This test is no t yet approved or cleared by the [place] FDA and  has been authorized for detection and/or diagnosis of SARS-CoV-2 by FDA under an Emergency Use Authorization (EUA). This EUA will remain  in effect (meaning this test can be used) for the duration of the COVID-19 declaration under Section 564(b)(1) of the Act, 21 U.S.C.section 360bbb-3(b)(1), unless the authorization is terminated  or revoked sooner.       Influenza A by PCR NEGATIVE NEGATIVE Final   Influenza B by PCR NEGATIVE NEGATIVE Final    Comment: (NOTE) The Xpert Xpress SARS-CoV-2/FLU/RSV plus assay is intended as an aid in the diagnosis of influenza from Nasopharyngeal swab specimens and should not be used as a sole basis for treatment. Nasal washings and aspirates are unacceptable for Xpert Xpress SARS-CoV-2/FLU/RSV testing.  Fact Sheet for Patients: BloggerCourse.com  Fact Sheet for Healthcare Providers: SeriousBroker.it  This test is not yet approved or cleared by the [place] FDA and has been authorized for detection and/or diagnosis of SARS-CoV-2 by FDA under an Emergency Use Authorization (EUA). This EUA will remain in effect (meaning this test can be used) for the duration of the COVID-19 declaration under Section 564(b)(1) of the Act, 21 U.S.C. section  360bbb-3(b)(1), unless the authorization is terminated or revoked.     Resp Syncytial Virus by PCR NEGATIVE NEGATIVE Final    Comment: (NOTE) Fact Sheet for Patients: BloggerCourse.com  Fact Sheet for Healthcare Providers: SeriousBroker.it  This test is not yet approved or cleared by the [place] FDA and has been authorized for detection and/or diagnosis of SARS-CoV-2 by FDA under an Emergency Use Authorization (EUA). This EUA will remain in effect (meaning this test can be used) for the duration of the COVID-19 declaration under Section 564(b)(1) of the Act, 21 U.S.C. section  360bbb-3(b)(1), unless the authorization is terminated or revoked.  Performed at [facility], [address] [place], [place] [address]   Respiratory (~20 pathogens) panel by PCR     Status: None   Collection Time: 04/07/23 12:49 PM  Result Value Ref Range Status   Adenovirus NOT DETECTED NOT DETECTED Final   Coronavirus 229E NOT DETECTED NOT DETECTED Final    Comment: (NOTE) The Coronavirus on the Respiratory Panel, DOES NOT test for the novel  Coronavirus (2019 nCoV)    Coronavirus HKU1 NOT DETECTED NOT DETECTED Final   Coronavirus NL63 NOT DETECTED NOT DETECTED Final   Coronavirus OC43 NOT DETECTED NOT DETECTED Final   Metapneumovirus NOT DETECTED NOT DETECTED Final   Rhinovirus / Enterovirus NOT DETECTED NOT DETECTED Final   Influenza A NOT DETECTED NOT DETECTED Final   Influenza B NOT DETECTED NOT DETECTED Final   Parainfluenza Virus 1 NOT DETECTED NOT DETECTED Final   Parainfluenza Virus 2 NOT DETECTED NOT DETECTED Final   Parainfluenza Virus 3 NOT DETECTED NOT DETECTED Final   Parainfluenza Virus 4 NOT DETECTED NOT DETECTED Final   Respiratory Syncytial Virus NOT DETECTED NOT DETECTED Final   Bordetella pertussis NOT DETECTED NOT DETECTED Final   Bordetella Parapertussis NOT DETECTED NOT DETECTED Final   Chlamydophila  pneumoniae NOT DETECTED NOT DETECTED Final   Mycoplasma pneumoniae NOT DETECTED NOT DETECTED Final    Comment: Performed at [facility], [address], [place], [place] [address]     Labs: Basic Metabolic Panel: Recent Labs  Lab 04/07/23 0511 04/08/23 0527 04/09/23 0603 04/10/23 0627 04/12/23 0520  NA 141 136 135 135 134*  K 4.0 4.4 4.6 4.4 4.0  CL 101 99 100 98 100  CO2 30 25 26 28 26   GLUCOSE 130* 264* 229* 278* 139*  BUN 20 25* 26* 28* 29*  CREATININE 0.89 1.02* 0.90 1.02* 1.01*  CALCIUM 8.9 9.0 9.1 9.1 8.6*  MG  --  2.3  --  2.1  --    Liver Function Tests: Recent Labs  Lab 04/07/23 0511  AST 16  ALT 21  ALKPHOS 98  BILITOT 0.5  PROT 7.2  ALBUMIN 3.4*   No results for input(s): "LIPASE", "AMYLASE" in the last 168 hours. No results for input(s): "AMMONIA" in the last 168 hours. CBC: Recent Labs  Lab 04/07/23 0511 04/08/23 0527 04/09/23 0603 04/10/23 0627 04/12/23 0520  WBC 16.8* 18.2* 22.4* 21.5* 16.9*  NEUTROABS  --  16.4* 18.9* 18.1*  --   HGB 13.4 13.2 13.1 13.0 13.4  HCT 44.3 44.6 42.9 41.0 44.1  MCV 96.5 98.2 95.5 94.3 97.1  PLT 239 242 254 255 226   Cardiac Enzymes: No results for input(s): "CKTOTAL", "CKMB", "CKMBINDEX", "TROPONINI" in the last 168 hours. BNP: BNP (last 3 results) Recent Labs    02/24/23 2220  BNP 117.5*    ProBNP (last 3 results) Recent Labs    10/14/22 1619 01/14/23 1036  PROBNP 19.0 34.0    CBG: Recent Labs  Lab 04/11/23 1153 04/11/23 1654 04/11/23 2028 04/12/23 0740 04/12/23 1139  GLUCAP 227* 274* 196* 134* 133*       Signed:  Ramiro Harvest MD.  [facility] 04/12/2023, 2:21 PM

## 2023-04-12 NOTE — Progress Notes (Signed)
   04/12/23 0005  BiPAP/CPAP/SIPAP  BiPAP/CPAP/SIPAP Pt Type Adult  BiPAP/CPAP/SIPAP DREAMSTATIOND (self placement)  Mask Type Nasal mask  Mask Size Small  Flow Rate 4 lpm  Patient Home Equipment No  Auto Titrate Yes (5-20)

## 2023-04-13 ENCOUNTER — Other Ambulatory Visit (HOSPITAL_COMMUNITY): Payer: Self-pay

## 2023-04-13 ENCOUNTER — Telehealth: Payer: Self-pay

## 2023-04-13 NOTE — Transitions of Care (Post Inpatient/ED Visit) (Signed)
   04/13/2023  Name: LATANGA LEISURE MRN: [id number] DOB: 1961-04-11  Today's TOC FU Call Status:    Attempted to reach the patient regarding the most recent Inpatient/ED visit.  Follow Up Plan: Additional outreach attempts will be made to reach the patient to complete the Transitions of Care (Post Inpatient/ED visit) call.   Deidre Ala, RN Medical illustrator [facility] [phone]

## 2023-04-13 NOTE — Transitions of Care (Post Inpatient/ED Visit) (Signed)
04/13/2023  Name: Brenda [name] MRN: [id number] DOB: 1960/06/06  Today's TOC FU Call Status: Today's TOC FU Call Status:: Successful TOC FU Call Completed TOC FU Call Complete Date: 04/13/23 Patient's Name and Date of Birth confirmed.  Transition Care Management Follow-up Telephone Call Date of Discharge: 04/12/23 Discharge Facility: [facility]) Type of Discharge: Inpatient Admission Primary Inpatient Discharge Diagnosis:: COPD How have you been since you were released from the hospital?: Same Any questions or concerns?: No  Items Reviewed: Did you receive and understand the discharge instructions provided?: Yes Medications obtained,verified, and reconciled?: Yes (Medications Reviewed) Any new allergies since your discharge?: No Dietary orders reviewed?: NA Do you have support at home?: No  Medications Reviewed Today: Medications Reviewed Today     Reviewed by Redge Gainer, RN (Case Manager) on 04/13/23 at 1522  Med List Status: <None>   Medication Order Taking? Sig Documenting Provider Last Dose Status Informant  albuterol (PROVENTIL) (2.5 MG/3ML) 0.083% nebulizer solution [id number]  Take 3 mLs (2.5 mg total) by nebulization 3 (three) times daily for 4 days, THEN 3 mLs every 4 (four) hours as needed for wheezing or shortness of breath. Rodolph Bong, MD  Active   albuterol (VENTOLIN HFA) 108 [id number] Base) MCG/ACT inhaler [id number] No TAKE 2 PUFFS BY MOUTH EVERY 6 HOURS AS NEEDED FOR WHEEZE OR SHORTNESS OF Aron Baba, MD 04/04/2023 Active Self, Pharmacy Records  ARIPiprazole (ABILIFY) 10 MG tablet [id number] No Take 1 tablet (10 mg total) by mouth daily. [name], MD 04/03/2023 Active Self, Pharmacy Records  aspirin EC 81 MG EC tablet [id number] No Take 1 tablet (81 mg total) by mouth daily. Swallow whole. Narda Bonds, MD 04/03/2023 Active Self, Pharmacy Records  atorvastatin (LIPITOR) 80 MG tablet [id number] No TAKE 1 TABLET BY MOUTH EVERYDAY AT  BEDTIME For cholesterol [name], MD 04/02/2023 Active Self, Pharmacy Records  Budeson-Glycopyrrol-Formoterol (BREZTRI AEROSPHERE) 160-9-4.8 MCG/ACT AERO [id number] No Inhale 2 puffs into the lungs in the morning and at bedtime. Nyoka Cowden, MD 04/03/2023 Active Self, Pharmacy Records  cetirizine-pseudoephedrine (ZYRTEC-D) 5-120 MG tablet [id number] No Take 1 tablet by mouth every evening. [provider] Unk Active Self, Pharmacy Records  Cholecalciferol (VITAMIN D3) 25 MCG (1000 UT) CAPS [id number] No Take 1 capsule (1,000 Units total) by mouth daily. [name], MD 04/03/2023 Active Self, Pharmacy Records  dextromethorphan-guaiFENesin [facility] DM) 30-600 MG 12hr tablet [id number] No Take 1 tablet by mouth 2 (two) times daily as needed for cough. [provider] Lajoyce Lauber Self, Pharmacy Records           Med Note Michaela Corner   Fri Mar 05, 2023  1:33 PM) 03/05/23: Reports during [facility] call, has not needed recently  Dulaglutide (TRULICITY) 0.75 MG/0.5ML SOPN [id number] No Inject 0.75 mg into the skin once a week. [name], MD 03/28/2023 Active Self, Pharmacy Records  escitalopram (LEXAPRO) 20 MG tablet [id number] No Take 1 tablet (20 mg total) by mouth daily. [name], MD 04/03/2023 Active Self, Pharmacy Records  famotidine (PEPCID) 20 MG tablet [id number] No One after supper  Patient taking differently: Take 20 mg by mouth daily.   Nyoka Cowden, MD 04/03/2023 Active Self, Pharmacy Records  fluticasone [facility]) 50 MCG/ACT nasal spray [id number] No PLACE 1 SPRAY INTO BOTH NOSTRILS 2 (TWO) TIMES DAILY [name], MD 04/03/2023 Active Self, Pharmacy Records  furosemide (LASIX) 20 MG tablet [id number] No TAKE 1 TABLET BY MOUTH EVERY DAY [name], MD 04/03/2023 Active Self, Pharmacy Records  gabapentin (NEURONTIN) 300 MG capsule [id number] No Take 2 capsules (600 mg total) by mouth in the morning AND 3 capsules (900 mg total) at bedtime.  [name], MD 04/03/2023 Active Self, Pharmacy Records  JARDIANCE 10 MG TABS tablet [id number] No TAKE 1 TABLET BY MOUTH EVERY DAY BEFORE BREAKFAST  Patient taking differently: Take 10 mg by mouth daily.   [name], MD 04/03/2023 Active Self, Spouse/Significant Other, Pharmacy Records  LORazepam (ATIVAN) 0.5 MG tablet [id number] No TAKE 1/2 TO 1 TABLET BY MOUTH TWICE A DAY AS NEEDED FOR ANXIETY [name], MD unk Active Self, Pharmacy Records  metoprolol succinate (TOPROL-XL) 25 MG 24 hr tablet [id number] No Take 1 tablet (25 mg total) by mouth daily. [name], MD 04/03/2023 Active Self, Pharmacy Records  Multiple Vitamin (MULTIVITAMIN WITH MINERALS) TABS tablet [id number] No Take 1 tablet by mouth daily. [provider] 04/03/2023 Active Self, Pharmacy Records  nitroGLYCERIN (NITROSTAT) 0.4 MG SL tablet [id number] No Place 1 tablet (0.4 mg total) under the tongue every 5 (five) minutes as needed. Arty Baumgartner, NP unk Active Self, Pharmacy Records           Med Note Michaela Corner   Fri Mar 05, 2023  1:35 PM) 03/05/23: Reports during [facility] call, has not needed recently    pantoprazole (PROTONIX) 40 MG tablet [id number] No TAKE 1 TABLET BY MOUTH 2 TIMES DAILY 30-60 MIN BEFORE FIRST MEAL OF THE DAY  Patient taking differently: Take 40 mg by mouth at bedtime.   Glenford Bayley, NP 04/02/2023 Active Self, Pharmacy Records  predniSONE (DELTASONE) 20 MG tablet [id number]  Take 3 tablets by mouth daily before breakfast for 3 days, THEN 2 tablets daily before breakfast for 3 days, THEN 1 tablet daily before breakfast for 3 days. Rodolph Bong, MD  Active   sacubitril-valsartan [facility]) 24-26 MG [id number] No Take 1 tablet by mouth 2 (two) times daily. [name], MD 04/03/2023 Active Self, Pharmacy Records  spironolactone (ALDACTONE) 25 MG tablet [id number] No Take 1 tablet (25 mg total) by mouth daily. [name], MD 04/03/2023 Active  Self, Pharmacy Records  traMADol (ULTRAM) 50 MG tablet [id number] No Take 1 tablet (50 mg total) by mouth 2 (two) times daily as needed for moderate pain. [name], MD 04/04/2023 Active Self, Pharmacy Records            Home Care and Equipment/Supplies: Were Home Health Services Ordered?: NA Any new equipment or medical supplies ordered?: NA  Functional Questionnaire: Do you need assistance with bathing/showering or dressing?: No Do you need assistance with meal preparation?: No Do you need assistance with eating?: No Do you have difficulty maintaining continence: No Do you need assistance with getting out of bed/getting out of a chair/moving?: No Do you have difficulty managing or taking your medications?: No  Follow up appointments reviewed: PCP Follow-up appointment confirmed?: Yes Date of PCP follow-up appointment?: 04/27/23 Follow-up Provider: [name] Specialist [facility] Follow-up appointment confirmed?: Yes Date of Specialist follow-up appointment?: 06/04/23 Follow-Up Specialty Provider:: Ames Dura Do you need transportation to your follow-up appointment?: No Do you understand care options if your condition(s) worsen?: Yes-patient verbalized understanding  SDOH Interventions Today    Flowsheet Row Most Recent Value  SDOH Interventions   Food Insecurity Interventions Intervention Not Indicated  Transportation Interventions Intervention Not Indicated      The patient was discharged from the hospital with COPD exacerbation. She states she doesn't know why this happened. There isn't  a specific trigger. The patient had auditory wheezing throughout the phone interview. States she is on oxygen 2l/min continuous. She lives with her 36 year old father who is independent. She has a follow up appointment with PCP in two weeks and she does not want to move up the appointment. She declines any further assistance.   Deidre Ala, RN Marine scientist [facility] [phone]

## 2023-04-15 ENCOUNTER — Other Ambulatory Visit: Payer: Self-pay | Admitting: Internal Medicine

## 2023-04-15 ENCOUNTER — Other Ambulatory Visit: Payer: Self-pay | Admitting: Family Medicine

## 2023-04-16 DIAGNOSIS — G4733 Obstructive sleep apnea (adult) (pediatric): Secondary | ICD-10-CM | POA: Diagnosis not present

## 2023-04-17 ENCOUNTER — Other Ambulatory Visit: Payer: Self-pay | Admitting: Family Medicine

## 2023-04-17 DIAGNOSIS — F39 Unspecified mood [affective] disorder: Secondary | ICD-10-CM

## 2023-04-17 DIAGNOSIS — M1712 Unilateral primary osteoarthritis, left knee: Secondary | ICD-10-CM

## 2023-04-19 NOTE — Telephone Encounter (Signed)
ERx 

## 2023-04-21 ENCOUNTER — Ambulatory Visit (HOSPITAL_COMMUNITY)
Admission: RE | Admit: 2023-04-21 | Discharge: 2023-04-21 | Disposition: A | Discharge status: Home / Self Care | Payer: 59 | Source: Ambulatory Visit | Attending: Interventional Radiology | Admitting: Interventional Radiology

## 2023-04-21 DIAGNOSIS — I771 Stricture of artery: Secondary | ICD-10-CM | POA: Diagnosis not present

## 2023-04-21 DIAGNOSIS — I6523 Occlusion and stenosis of bilateral carotid arteries: Secondary | ICD-10-CM | POA: Diagnosis not present

## 2023-04-21 DIAGNOSIS — I700 Atherosclerosis of aorta: Secondary | ICD-10-CM | POA: Diagnosis not present

## 2023-04-21 DIAGNOSIS — I672 Cerebral atherosclerosis: Secondary | ICD-10-CM | POA: Diagnosis not present

## 2023-04-21 MED ORDER — SODIUM CHLORIDE (PF) 0.9 % IJ SOLN
INTRAMUSCULAR | Status: AC
  Filled 2023-04-21: qty 50

## 2023-04-21 MED ORDER — IOHEXOL 350 MG/ML SOLN
100.0000 mL | Freq: Once | INTRAVENOUS | Status: AC | PRN
  Administered 2023-04-21: 75 mL via INTRAVENOUS

## 2023-04-26 ENCOUNTER — Ambulatory Visit: Payer: 59

## 2023-04-26 DIAGNOSIS — G459 Transient cerebral ischemic attack, unspecified: Secondary | ICD-10-CM | POA: Diagnosis not present

## 2023-04-26 LAB — CUP PACEART REMOTE DEVICE CHECK
Date Time Interrogation Session: 20241122230159
Implantable Pulse Generator Implant Date: 20211102

## 2023-04-27 ENCOUNTER — Ambulatory Visit: Payer: 59 | Admitting: Family Medicine

## 2023-04-27 ENCOUNTER — Telehealth: Payer: Self-pay

## 2023-04-27 ENCOUNTER — Encounter: Payer: Self-pay | Admitting: Family Medicine

## 2023-04-27 VITALS — BP 128/66 | HR 75 | Temp 98.7°F | Ht 60.0 in | Wt 269.4 lb

## 2023-04-27 DIAGNOSIS — J449 Chronic obstructive pulmonary disease, unspecified: Secondary | ICD-10-CM | POA: Diagnosis not present

## 2023-04-27 DIAGNOSIS — I2781 Cor pulmonale (chronic): Secondary | ICD-10-CM

## 2023-04-27 DIAGNOSIS — J9611 Chronic respiratory failure with hypoxia: Secondary | ICD-10-CM | POA: Diagnosis not present

## 2023-04-27 DIAGNOSIS — I5032 Chronic diastolic (congestive) heart failure: Secondary | ICD-10-CM | POA: Diagnosis not present

## 2023-04-27 DIAGNOSIS — N3946 Mixed incontinence: Secondary | ICD-10-CM | POA: Insufficient documentation

## 2023-04-27 DIAGNOSIS — G4733 Obstructive sleep apnea (adult) (pediatric): Secondary | ICD-10-CM | POA: Diagnosis not present

## 2023-04-27 DIAGNOSIS — R32 Unspecified urinary incontinence: Secondary | ICD-10-CM | POA: Insufficient documentation

## 2023-04-27 DIAGNOSIS — E119 Type 2 diabetes mellitus without complications: Secondary | ICD-10-CM | POA: Diagnosis not present

## 2023-04-27 DIAGNOSIS — Z7985 Long-term (current) use of injectable non-insulin antidiabetic drugs: Secondary | ICD-10-CM

## 2023-04-27 DIAGNOSIS — Z6841 Body Mass Index (BMI) 40.0 and over, adult: Secondary | ICD-10-CM

## 2023-04-27 LAB — BASIC METABOLIC PANEL
BUN: 13 mg/dL (ref 6–23)
CO2: 32 meq/L (ref 19–32)
Calcium: 9.1 mg/dL (ref 8.4–10.5)
Chloride: 103 meq/L (ref 96–112)
Creatinine, Ser: 1.1 mg/dL (ref 0.40–1.20)
GFR: 53 mL/min — ABNORMAL LOW (ref >60.00)
Glucose, Bld: 136 mg/dL — ABNORMAL HIGH (ref 70–99)
Potassium: 4.5 meq/L (ref 3.5–5.1)
Sodium: 143 meq/L (ref 135–145)

## 2023-04-27 LAB — CBC WITH DIFFERENTIAL/PLATELET
Basophils Absolute: 0.1 10*3/uL (ref 0.0–0.1)
Basophils Relative: 0.5 % (ref 0.0–3.0)
Eosinophils Absolute: 0.5 10*3/uL (ref 0.0–0.7)
Eosinophils Relative: 4.3 % (ref 0.0–5.0)
HCT: 41.6 % (ref 36.0–46.0)
Hemoglobin: 13.3 g/dL (ref 12.0–15.0)
Lymphocytes Relative: 14.3 % (ref 12.0–46.0)
Lymphs Abs: 1.7 10*3/uL (ref 0.7–4.0)
MCHC: 31.9 g/dL (ref 30.0–36.0)
MCV: 93.7 fL (ref 78.0–100.0)
Monocytes Absolute: 0.7 10*3/uL (ref 0.1–1.0)
Monocytes Relative: 6 % (ref 3.0–12.0)
Neutro Abs: 9.1 10*3/uL — ABNORMAL HIGH (ref 1.4–7.7)
Neutrophils Relative %: 74.9 % (ref 43.0–77.0)
Platelets: 204 10*3/uL (ref 150.0–400.0)
RBC: 4.44 Mil/uL (ref 3.87–5.11)
RDW: 16.4 % — ABNORMAL HIGH (ref 11.5–15.5)
WBC: 12.1 10*3/uL — ABNORMAL HIGH (ref 4.0–10.5)

## 2023-04-27 NOTE — Telephone Encounter (Signed)
We have not discussed this recently.  Will need to discuss with patient. I called and left a message.

## 2023-04-27 NOTE — Telephone Encounter (Signed)
Spoke to pt, pt states she did intiate order. Call back # [phone]

## 2023-04-27 NOTE — Addendum Note (Signed)
Addended by: [name] on: 04/27/2023 01:33 PM   Modules accepted: Level of Service

## 2023-04-27 NOTE — Assessment & Plan Note (Addendum)
Chronic, stable period on current regimen with good control based on last A1c in hospital. Discussed Trulicity use - will not increase dose in remote h/o pancreatitis (1991)  Continue low dose Trulicity and Jardiance.

## 2023-04-27 NOTE — Progress Notes (Addendum)
Ph: [phone] Fax: [phone]   Patient ID: Brenda [name], female    DOB: 14-Aug-1960, 62 y.o.   MRN: [id number]  This visit was conducted in person.  BP 128/66   Pulse 75   Temp 98.7 F (37.1 C) (Oral)   Ht 5' (1.524 m)   Wt 269 lb 6 oz (122.2 kg)   LMP 03/08/2013   SpO2 96% Comment: 2 L  BMI 52.61 kg/m    CC: 6 mo f/u visit  Subjective:   HPI: Brenda [name] is a 62 y.o. female presenting on 04/27/2023 for Medical Management of Chronic Issues (Here for 6 mo DM f/u.)   O2 dependent asthmatic bronchitis/COPD on breztri 2 puffs BID and oxygen at 2L via [place] followed by pulm Dr Sherene Sires. Just restarted CPAP use while in hospital, continues this at home. Still in process of getting new CPAP machine. Known cor pulmonale and CHF. She sleeps on adjustable bed.   Recent hospitalizations for acute on chronic hypox respiratory failure caused by COPD exacerbation 02/24/2023 and again 04/03/2023. H/o hospitalization for takotsubo cardiomyopathy summer 2021. Subsequent inferior STEMI 10/2020 s/p PCI to distal RCA.   She requests portable oxygen concentrator.  She uses [facility].   Ongoing mixed urinary incontinence for the past year, stress > urge.  Worse symptoms when COPD flares - coughing with dyspnea.  No dysuria, hematuria.  She is on jardiance for the past year as well as spironolactone and lasix.  Regularly uses briefs 2/day, as well as less frequently pads 2/day  DM - does not regularly check sugars. Compliant with antihyperglycemic regimen which includes: jardiance 10mg  daily, trulicity 0.75mg  weekly. She would be interested in higher Trulicity dose however has h/o remote pancreatitis 1990s (after cholecystectomy 1984). Metformin intolerance - diarrhea. Denies low sugars or hypoglycemic symptoms. Denies paresthesias, blurry vision. Last diabetic eye exam 05/2022. Glucometer brand: Relion. Last foot exam: 04/2022. DSME: declined.  Lab Results  Component  Value Date   HGBA1C 6.5 (H) 04/08/2023   Diabetic Foot Exam - Simple   Simple Foot Form Diabetic Foot exam was performed with the following findings: Yes 04/27/2023 11:34 AM  Visual Inspection No deformities, no ulcerations, no other skin breakdown bilaterally: Yes Sensation Testing Intact to touch and monofilament testing bilaterally: Yes Pulse Check Posterior Tibialis and Dorsalis pulse intact bilaterally: Yes Comments No claudication    Lab Results  Component Value Date   MICROALBUR <0.7 10/20/2022     F/u pulm appt scheduled for 06/2023.  ______________________________________________________________________________ Admit date: 04/03/2023 Discharge date: 04/12/2023   Recommendations for Outpatient Follow-up:  Follow-up with [name], MD in 2 weeks.  On follow-up patient will need a basic metabolic profile done to follow-up on electrolytes and renal function.  Patient COPD will need to be followed up upon.  Patient's diabetes will need to be followed up upon. Follow-up with Dr. Sherene Sires, pulmonary in 3 to 4 weeks.  Discharge Diagnoses:  Principal Problem:   Acute on chronic hypoxic respiratory failure (HCC) Active Problems:   Chronic hypoxic respiratory failure (HCC)   COPD exacerbation (HCC)   COPD with acute exacerbation (HCC)   HYPERTRIGLYCERIDEMIA   Morbid obesity with BMI of 50.0-59.9, adult (HCC)   Leukocytosis   Ex-smoker   Essential hypertension   OSA on CPAP   Type 2 diabetes mellitus without complication (HCC)   Diastolic dysfunction   Unilateral primary osteoarthritis, left knee   Knee mass, right     Relevant past medical, surgical,  family and social history reviewed and updated as indicated. Interim medical history since our last visit reviewed. Allergies and medications reviewed and updated. Outpatient Medications Prior to Visit  Medication Sig Dispense Refill   albuterol (PROVENTIL) (2.5 MG/3ML) 0.083% nebulizer solution Take 3 mLs (2.5 mg  total) by nebulization 3 (three) times daily for 4 days, THEN 3 mLs every 4 (four) hours as needed for wheezing or shortness of breath. 90 mL 0   albuterol (VENTOLIN HFA) 108 (90 Base) MCG/ACT inhaler TAKE 2 PUFFS BY MOUTH EVERY 6 HOURS AS NEEDED FOR WHEEZE OR SHORTNESS OF BREATH 8.5 each 3   ARIPiprazole (ABILIFY) 10 MG tablet Take 1 tablet (10 mg total) by mouth daily. 90 tablet 4   aspirin EC 81 MG EC tablet Take 1 tablet (81 mg total) by mouth daily. Swallow whole. 30 tablet 0   atorvastatin (LIPITOR) 80 MG tablet TAKE 1 TABLET BY MOUTH EVERYDAY AT BEDTIME For cholesterol 90 tablet 4   Budeson-Glycopyrrol-Formoterol (BREZTRI AEROSPHERE) 160-9-4.8 MCG/ACT AERO Inhale 2 puffs into the lungs in the morning and at bedtime.     cetirizine-pseudoephedrine (ZYRTEC-D) 5-120 MG tablet Take 1 tablet by mouth every evening.     Cholecalciferol (VITAMIN D3) 25 MCG (1000 UT) CAPS Take 1 capsule (1,000 Units total) by mouth daily. 30 capsule    dextromethorphan-guaiFENesin (MUCINEX DM) 30-600 MG 12hr tablet Take 1 tablet by mouth 2 (two) times daily as needed for cough.     Dulaglutide (TRULICITY) 0.75 MG/0.5ML SOPN Inject 0.75 mg into the skin once a week. 6 mL 4   escitalopram (LEXAPRO) 20 MG tablet Take 1 tablet (20 mg total) by mouth daily. 90 tablet 4   famotidine (PEPCID) 20 MG tablet One after supper (Patient taking differently: Take 20 mg by mouth daily.) 30 tablet 11   fluticasone (FLONASE) 50 MCG/ACT nasal spray PLACE 1 SPRAY INTO BOTH NOSTRILS 2 (TWO) TIMES DAILY 48 mL 3   furosemide (LASIX) 20 MG tablet TAKE 1 TABLET BY MOUTH EVERY DAY 90 tablet 0   gabapentin (NEURONTIN) 300 MG capsule Take 2 capsules (600 mg total) by mouth in the morning AND 3 capsules (900 mg total) at bedtime. 450 capsule 4   JARDIANCE 10 MG TABS tablet TAKE 1 TABLET BY MOUTH EVERY DAY BEFORE BREAKFAST (Patient taking differently: Take 10 mg by mouth daily.) 90 tablet 3   LORazepam (ATIVAN) 0.5 MG tablet TAKE 1/2 TO 1 TABLET  BY MOUTH TWICE A DAY AS NEEDED FOR ANXIETY 30 tablet 2   metoprolol succinate (TOPROL-XL) 25 MG 24 hr tablet Take 1 tablet (25 mg total) by mouth daily. 90 tablet 3   Multiple Vitamin (MULTIVITAMIN WITH MINERALS) TABS tablet Take 1 tablet by mouth daily.     nitroGLYCERIN (NITROSTAT) 0.4 MG SL tablet Place 1 tablet (0.4 mg total) under the tongue every 5 (five) minutes as needed. 25 tablet 2   OXYGEN Inhale 2 L/min into the lungs continuous.     pantoprazole (PROTONIX) 40 MG tablet TAKE 1 TABLET BY MOUTH 2 TIMES DAILY 30-60 MIN BEFORE FIRST MEAL OF THE DAY (Patient taking differently: Take 40 mg by mouth at bedtime.) 180 tablet 3   sacubitril-valsartan (ENTRESTO) 24-26 MG Take 1 tablet by mouth 2 (two) times daily. 180 tablet 3   spironolactone (ALDACTONE) 25 MG tablet Take 1 tablet (25 mg total) by mouth daily. 90 tablet 3   traMADol (ULTRAM) 50 MG tablet Take 1 tablet (50 mg total) by mouth 2 (two) times daily as  needed. 20 tablet 0   No facility-administered medications prior to visit.     Per HPI unless specifically indicated in ROS section below Review of Systems  Objective:  BP 128/66   Pulse 75   Temp 98.7 F (37.1 C) (Oral)   Ht 5' (1.524 m)   Wt 269 lb 6 oz (122.2 kg)   LMP 03/08/2013   SpO2 96% Comment: 2 L  BMI 52.61 kg/m   Wt Readings from Last 3 Encounters:  04/27/23 269 lb 6 oz (122.2 kg)  04/12/23 275 lb 12.7 oz (125.1 kg)  03/16/23 269 lb (122 kg)      Physical Exam Vitals and nursing note reviewed.  Constitutional:      Appearance: Normal appearance. She is not ill-appearing.     Comments: Supplemental oxygen via oxygen tank  HENT:     Head: Normocephalic and atraumatic.     Mouth/Throat:     Mouth: Mucous membranes are moist.     Pharynx: Oropharynx is clear. No oropharyngeal exudate or posterior oropharyngeal erythema.  Eyes:     Extraocular Movements: Extraocular movements intact.     Conjunctiva/sclera: Conjunctivae normal.     Pupils: Pupils are  equal, round, and reactive to light.  Cardiovascular:     Rate and Rhythm: Normal rate and regular rhythm.     Pulses: Normal pulses.     Heart sounds: Normal heart sounds. No murmur heard. Pulmonary:     Effort: Pulmonary effort is normal. No respiratory distress.     Breath sounds: Normal breath sounds. No wheezing, rhonchi or rales.     Comments: Faint audible wheezing but lungs overall clear Musculoskeletal:     Right lower leg: No edema.     Left lower leg: No edema.  Skin:    General: Skin is warm and dry.     Findings: No rash.  Neurological:     Mental Status: She is alert.  Psychiatric:        Mood and Affect: Mood normal.        Behavior: Behavior normal.       Results for orders placed or performed in visit on 04/27/23  Basic metabolic panel  Result Value Ref Range   Sodium 143 135 - 145 mEq/L   Potassium 4.5 3.5 - 5.1 mEq/L   Chloride 103 96 - 112 mEq/L   CO2 32 19 - 32 mEq/L   Glucose, Bld 136 (H) 70 - 99 mg/dL   BUN 13 6 - 23 mg/dL   Creatinine, Ser 1.61 0.40 - 1.20 mg/dL   GFR 09.60 (L) >45.40 mL/min   Calcium 9.1 8.4 - 10.5 mg/dL  CBC with Differential/Platelet  Result Value Ref Range   WBC 12.1 (H) 4.0 - 10.5 K/uL   RBC 4.44 3.87 - 5.11 Mil/uL   Hemoglobin 13.3 12.0 - 15.0 g/dL   HCT 98.1 19.1 - 47.8 %   MCV 93.7 78.0 - 100.0 fl   MCHC 31.9 30.0 - 36.0 g/dL   RDW 29.5 (H) 62.1 - 30.8 %   Platelets 204.0 150.0 - 400.0 K/uL   Neutrophils Relative % 74.9 43.0 - 77.0 %   Lymphocytes Relative 14.3 12.0 - 46.0 %   Monocytes Relative 6.0 3.0 - 12.0 %   Eosinophils Relative 4.3 0.0 - 5.0 %   Basophils Relative 0.5 0.0 - 3.0 %   Neutro Abs 9.1 (H) 1.4 - 7.7 K/uL   Lymphs Abs 1.7 0.7 - 4.0 K/uL   Monocytes Absolute 0.7  0.1 - 1.0 K/uL   Eosinophils Absolute 0.5 0.0 - 0.7 K/uL   Basophils Absolute 0.1 0.0 - 0.1 K/uL    Assessment & Plan:   Problem List Items Addressed This Visit     Morbid obesity with BMI of 50.0-59.9, adult (HCC)   OSA on CPAP     She has restarted CPAP use since latest hospitalization.       Type 2 diabetes mellitus without complication (HCC) - Primary    Chronic, stable period on current regimen with good control based on last A1c in hospital. Discussed Trulicity use - will not increase dose in remote h/o pancreatitis (1991)  Continue low dose Trulicity and Jardiance.      Relevant Orders   Basic metabolic panel (Completed)   CBC with Differential/Platelet (Completed)   Chronic hypoxic respiratory failure (HCC)    2L O2 dependent 24/7.  She requests evaluation for portable oxygen concentrator - will ask [facility] for evaluation.       Relevant Orders   For home use only DME oxygen   Cor pulmonale (chronic) (HCC)   Relevant Orders   For home use only DME oxygen   COPD (chronic obstructive pulmonary disease) (HCC)    Has had 2 hospitalizations due to COPD exacerbation in the past 2 months.  Completed prednisone taper.  She has recently restarted CPAP use.  Breathing stable today. Continue current regimen of controller Breztri with albuterol PRN.       Relevant Orders   For home use only DME oxygen   Chronic heart failure with preserved ejection fraction (HFpEF, >= 50%) (HCC)   Mixed urge and stress incontinence    No symptoms of UTI.  Not on medication for this.  Will need updated UA next visit.  She regularly uses depends and urinary pad.         No orders of the defined types were placed in this encounter.   Orders Placed This Encounter  Procedures   For home use only DME oxygen    Pt uses [facility] for DME. Please provide POC for J96.11, I27.81,J44.9    Order Specific Question:   Length of Need    Answer:   Lifetime    Order Specific Question:   Mode or (Route)    Answer:   Nasal cannula    Order Specific Question:   Liters per Minute    Answer:   2    Order Specific Question:   Frequency    Answer:   Continuous (stationary and portable oxygen unit needed)    Order Specific Question:    Oxygen conserving device    Answer:   Yes    Order Specific Question:   Oxygen delivery system    Answer:   Gas   Basic metabolic panel   CBC with Differential/Platelet    Patient Instructions  Labs today  Continue current medicines including diabetes and breathing medicines.  Keep lung doctor appointment Return in 6 months for physical.   Follow up plan: Return in about 6 months (around 10/25/2023) for annual exam, prior fasting for blood work, medicare wellness visit.  [name], MD

## 2023-04-27 NOTE — Patient Instructions (Signed)
Labs today  Continue current medicines including diabetes and breathing medicines.  Keep lung doctor appointment Return in 6 months for physical.

## 2023-04-27 NOTE — Assessment & Plan Note (Addendum)
Has had 2 hospitalizations due to COPD exacerbation in the past 2 months.  Completed prednisone taper.  She has recently restarted CPAP use.  Breathing stable today. Continue current regimen of controller Breztri with albuterol PRN.

## 2023-04-27 NOTE — Telephone Encounter (Signed)
Received faxed order from Tacy Dura of [facility] about continence care supplies.  Lvm asking pt to call back. Need to know if pt initiated order.   [Form is in basket on Lisa's desk pending call back from pt.]

## 2023-04-27 NOTE — Assessment & Plan Note (Addendum)
2L O2 dependent 24/7.  She requests evaluation for portable oxygen concentrator - will ask [facility] for evaluation.

## 2023-04-27 NOTE — Telephone Encounter (Addendum)
Noted.  Dr Reece Agar, have you had an OV with pt where incontinence was discussed? I see a previous order that was faxed in 11/2022. [facility] is asking for OV notes within the last 3 mos that detail pt's incontinence and/or inability to hold their bladder.

## 2023-04-27 NOTE — Assessment & Plan Note (Signed)
She has restarted CPAP use since latest hospitalization.

## 2023-04-28 NOTE — Assessment & Plan Note (Addendum)
No symptoms of UTI.  Not on medication for this.  Will need updated UA next visit.  She regularly uses depends and urinary pad.

## 2023-04-28 NOTE — Telephone Encounter (Signed)
Called again, no answer

## 2023-04-28 NOTE — Telephone Encounter (Signed)
Patient returned call to office, please follow-up

## 2023-04-28 NOTE — Telephone Encounter (Addendum)
Spoke with patient - Ongoing mixed urinary incontinence for the past year, stress > urge.  Worse symptoms when COPD flares - coughing with dyspnea.  No dysuria, hematuria.  She is on jardiance for the past year as well as spironolactone and lasix.  Regularly uses briefs 2/day, as well as less frequently pads 2/day  Office visit printed and placed in Lisa's box.

## 2023-04-28 NOTE — Telephone Encounter (Signed)
Faxed order and OV notes to [facility] at [phone]; attn:  Deon Bond.

## 2023-05-08 DIAGNOSIS — G4733 Obstructive sleep apnea (adult) (pediatric): Secondary | ICD-10-CM | POA: Diagnosis not present

## 2023-05-11 ENCOUNTER — Telehealth (HOSPITAL_COMMUNITY): Payer: Self-pay

## 2023-05-11 NOTE — Telephone Encounter (Signed)
Pt agreed to f/u in 2 years with a US carotid. AB

## 2023-05-12 ENCOUNTER — Other Ambulatory Visit: Payer: Self-pay | Admitting: Family Medicine

## 2023-05-12 DIAGNOSIS — M1712 Unilateral primary osteoarthritis, left knee: Secondary | ICD-10-CM

## 2023-05-13 NOTE — Telephone Encounter (Signed)
Name of Medication:  Tramadol  Name of Pharmacy:  [facility] Last Fill or Written Date and Quantity:  04/19/23, #20 Last Office Visit and Type:  04/27/23, 6 mo DM f/u Next Office Visit and Type:  10/29/23, CPE Last Controlled Substance Agreement Date:  none Last UDS:  none

## 2023-05-14 NOTE — Telephone Encounter (Signed)
ERx 

## 2023-05-16 ENCOUNTER — Other Ambulatory Visit: Payer: Self-pay | Admitting: Family Medicine

## 2023-05-16 DIAGNOSIS — J449 Chronic obstructive pulmonary disease, unspecified: Secondary | ICD-10-CM

## 2023-05-17 NOTE — Telephone Encounter (Signed)
Albuterol inh Last filled:  04/17/23, #8.5 ea Last OV:  04/27/23, 6 mo DM f/u Next OV:  10/29/23, CPE

## 2023-05-20 ENCOUNTER — Other Ambulatory Visit: Payer: Self-pay | Admitting: Cardiovascular Disease

## 2023-05-21 ENCOUNTER — Other Ambulatory Visit: Payer: Self-pay | Admitting: Family Medicine

## 2023-05-24 NOTE — Progress Notes (Signed)
Carelink Summary Report / Loop Recorder 

## 2023-05-31 ENCOUNTER — Ambulatory Visit (INDEPENDENT_AMBULATORY_CARE_PROVIDER_SITE_OTHER): Payer: 59

## 2023-05-31 DIAGNOSIS — G459 Transient cerebral ischemic attack, unspecified: Secondary | ICD-10-CM

## 2023-05-31 LAB — CUP PACEART REMOTE DEVICE CHECK
Date Time Interrogation Session: 20241229230257
Implantable Pulse Generator Implant Date: 20211102

## 2023-06-02 DIAGNOSIS — G4733 Obstructive sleep apnea (adult) (pediatric): Secondary | ICD-10-CM | POA: Diagnosis not present

## 2023-06-08 DIAGNOSIS — G4733 Obstructive sleep apnea (adult) (pediatric): Secondary | ICD-10-CM | POA: Diagnosis not present

## 2023-06-13 ENCOUNTER — Other Ambulatory Visit: Payer: Self-pay | Admitting: Family Medicine

## 2023-06-13 DIAGNOSIS — M1712 Unilateral primary osteoarthritis, left knee: Secondary | ICD-10-CM

## 2023-06-14 ENCOUNTER — Ambulatory Visit: Payer: 59 | Admitting: Primary Care

## 2023-06-14 ENCOUNTER — Encounter: Payer: Self-pay | Admitting: Primary Care

## 2023-06-14 VITALS — BP 115/77 | HR 72 | Temp 96.9°F | Ht 60.0 in | Wt 277.6 lb

## 2023-06-14 DIAGNOSIS — J449 Chronic obstructive pulmonary disease, unspecified: Secondary | ICD-10-CM

## 2023-06-14 DIAGNOSIS — G4733 Obstructive sleep apnea (adult) (pediatric): Secondary | ICD-10-CM

## 2023-06-14 DIAGNOSIS — I5042 Chronic combined systolic (congestive) and diastolic (congestive) heart failure: Secondary | ICD-10-CM

## 2023-06-14 DIAGNOSIS — J9611 Chronic respiratory failure with hypoxia: Secondary | ICD-10-CM | POA: Diagnosis not present

## 2023-06-14 DIAGNOSIS — J4530 Mild persistent asthma, uncomplicated: Secondary | ICD-10-CM

## 2023-06-14 MED ORDER — PREDNISONE 10 MG PO TABS: ORAL_TABLET | ORAL | 0 refills | Status: DC

## 2023-06-14 MED ORDER — AZELASTINE HCL 0.1 % NA SOLN: 2.0000 | Freq: Two times a day (BID) | NASAL | 1 refills | Status: DC

## 2023-06-14 NOTE — Telephone Encounter (Signed)
 Refill request for traMADol (ULTRAM) 50 MG tablet   LOV - 04/27/23 Next OV - 10/29/23 Last refill - 05/14/23 #20/0

## 2023-06-14 NOTE — Progress Notes (Signed)
 @Patient  ID: Brenda [name], female    DOB: 1960/08/31, 63 y.o.   MRN: [id number]  No chief complaint on file.   Referring provider: Rilla Baller, MD  HPI: 63 year old female, former smoker.  Past medical history significant for COPD Gold 2, OSA on CPAP, chronic respiratory failure, hypertension, cor pulmonale, CHF, coronary artery disease, type 2 diabetes, chronic kidney disease, morbid obesity.  Patient of Dr. Darlean.  Previous [facility] pulmonary encounter:  01/14/2023 Patient presents today for 4 week follow-up.   -She has upper airway wheeze and productive cough with creamy yellow/color -She was seen on July 12th by Dr. Darlean and treated for COPD exacerbation with course of Augmentin  and prednisone  taper. She saw no improvement after taking medication.  -Using Breztri  Aerosphere two puffs twice daily -She used her nebulizer this morning without much improvement  -Wearing 2L 24/7  -She reports compliance with Protonix  and Pepcid   -She has been taking Mucinex -D  -She has a lot of swelling in legs, she takes lasix  20mg  daily and spirnolactone   -She has gained 5-7 lbs since last visit  -She has CPAP but has not been able to wear it for the last 6 months. Feels like she is suffocating. She has an incline bed.   Airview download 06/12/2020-09/09/2020  06/12/2020 - 09/09/2020 Usage days 28/90 days (31%) Average usage days used 7 hours 16 minutes Pressure 5 to 15 cm H2O (14.4cm h20-95%) AHI 3.0    02/04/2023 She is doing a lot better since last OV in August, treatment for acute exacerbation. She feels back to baseline. She completed prednisone  course and states that Zyrtec -d really helped her symptoms. She is in the process of getting a new CPAP machine  OSA on CPAP - Advised patient resume CPAP use once she gets new machine  - FU in 3 months for compliance check   COPD GOLD 2 - Improved - Continue Breztri  Aerosphere 2 puffs q 12 hours (use with spacer) - Continue Albuterol  nebulizer  q4-6 hours for breakthrough shortness of breath/wheezing   Chronic respiratory failure with hypoxia (HCC) - Stable; Oxygen  dependent 2L 24/7   Chronic combined systolic and diastolic CHF (congestive heart failure) (HCC) - Continue lasix  20mg  daily and spironolactone  as directed  Upper airway cough syndrome -  Prontonix 40mg  BID and Pepcid  20mg  at bedtime   06/14/2023- interim hx  Discussed the use of AI scribe software for clinical note transcription with the patient, who gave verbal consent to proceed.  Patient presents today for hospital follow-up. She was admitted in November from 04/03/23-04/12/23 for acute on chronic respiratory failure secondary to COPD exacerbation. Hx diastolic dysfunction and OSA on CPAP.  She required an increased oxygen  supply of four liters, up from her baseline of two liters. During her hospital stay, she was treated with nebulizers and a course of prednisone  and azithromycin . She was discharged once her oxygen  requirements returned to her baseline of two liters and she was deemed stable.  Since her discharge two months ago, the patient reports returning to her baseline health with no significant flare-ups or additional hospitalizations. She has been managing her COPD with a Breztri  inhaler twice daily and has rescue nebulizers and inhalers at home.  Regarding her heart failure, she is on aspirin , a cholesterol medication Entresto , and a diuretic called spironolactone . She reports that her Lasix  was held during her hospital stay due to borderline blood pressure but was resumed upon discharge.  The patient also has severe sleep apnea, managed with a  CPAP machine, which she admits to using inconsistently. Her last sleep study was in 2016, showing about fifty-eight apneic events per hour. She has lost about fifteen pounds since her last sleep study.  Recently, the patient has noticed a change in her mucus color to a grayish-brown, which she believes is due to sinus  drainage. She is currently managing this with Flonase  and Zyrtec  D. She also reports some acid reflux, which is being managed with medication.      Allergies  Allergen Reactions   Metolazone Other (See Comments)    Metabolic mood swings    Immunization History  Administered Date(s) Administered   Influenza Whole 04/01/2002   Influenza, Seasonal, Injecte, Preservative Fre 03/16/2023   Influenza,inj,Quad PF,6+ Mos 03/21/2015, 07/27/2018, 02/08/2019, 02/23/2020, 04/04/2021, 04/10/2022   Influenza-Unspecified 03/01/2014, 04/22/2016, 03/25/2017   PFIZER(Purple Top)SARS-COV-2 Vaccination 02/09/2020, 03/06/2020   PNEUMOCOCCAL CONJUGATE-20 10/01/2020   Pneumococcal Polysaccharide-23 04/01/2002, 07/24/2015   Td 06/01/1993   Tdap 02/24/2017    Past Medical History:  Diagnosis Date   Asthma    main dyspnea thought due to deconditioning (10/2013)   Bipolar I disorder, most recent episode (or current) manic, unspecified    pt denies this   Bronchitis, chronic obstructive (HCC) 2008 & 2009   FeV1 64% TLC 105% DLCO 55% 2008  -  FeV1 81% FeF 25-75 43% 2009   CAD (coronary artery disease)    inferior STEMI in 10/2020 s/p DES to RCA   Chronic combined systolic and diastolic CHF (congestive heart failure) (HCC)    LVEF 30-35% in 01/2020 but improved to 60-65% in 05/2021   COPD (chronic obstructive pulmonary disease) (HCC)    emphysema, not an issue per pulm (10/2013)   History of acute pancreatitis 1990   History of pyelonephritis 05/2012   History of ST elevation myocardial infarction (STEMI)    HTN (hypertension)    Hx of migraines    Hyperlipidemia    Knee pain, right    Lumbar disc disease with radiculopathy    multilevel spondylitic changes, scoliosis, and anterolisthesis L4 not thought to currently be surg candidate (Dr. Amon, [facility]) (Dr. Mindi, [place])   Nicotine  addiction    Obesity    RSV (respiratory syncytial virus pneumonia) 01/2020   hospitalization   Suicide attempt (HCC)  06/2001   Swelling of limb    TIA (transient ischemic attack)    Type 2 diabetes mellitus (HCC)    Urge and stress incontinence 07/2012   ([facility])    Tobacco History: Social History   Tobacco Use  Smoking Status Former   Current packs/day: 0.00   Average packs/day: 0.3 packs/day for 41.0 years (10.3 ttl pk-yrs)   Types: Cigarettes   Start date: 10/01/1980   Quit date: 10/01/2021   Years since quitting: 1.7   Passive exposure: Current  Smokeless Tobacco Never   Counseling given: Not Answered   Outpatient Medications Prior to Visit  Medication Sig Dispense Refill   albuterol  (PROVENTIL ) (2.5 MG/3ML) 0.083% nebulizer solution INHALE 1 VIAL VIA NEBULIZER EVERY 6 HOURS AS NEEDED FOR WHEEZING/SHORTNESS OF BREATH 150 mL 1   albuterol  (VENTOLIN  HFA) 108 (90 Base) MCG/ACT inhaler TAKE 2 PUFFS BY MOUTH EVERY 6 HOURS AS NEEDED FOR WHEEZE OR SHORTNESS OF BREATH 8.5 each 3   ARIPiprazole  (ABILIFY ) 10 MG tablet Take 1 tablet (10 mg total) by mouth daily. 90 tablet 4   aspirin  EC 81 MG EC tablet Take 1 tablet (81 mg total) by mouth daily. Swallow whole. 30 tablet 0  atorvastatin  (LIPITOR) 80 MG tablet TAKE 1 TABLET BY MOUTH EVERYDAY AT BEDTIME For cholesterol 90 tablet 4   Budeson-Glycopyrrol-Formoterol  (BREZTRI  AEROSPHERE) 160-9-4.8 MCG/ACT AERO Inhale 2 puffs into the lungs in the morning and at bedtime.     cetirizine -pseudoephedrine (ZYRTEC -D) 5-120 MG tablet Take 1 tablet by mouth every evening.     Cholecalciferol  (VITAMIN D3) 25 MCG (1000 UT) CAPS Take 1 capsule (1,000 Units total) by mouth daily. 30 capsule    dextromethorphan -guaiFENesin  (MUCINEX  DM) 30-600 MG 12hr tablet Take 1 tablet by mouth 2 (two) times daily as needed for cough.     Dulaglutide  (TRULICITY ) 0.75 MG/0.5ML SOPN Inject 0.75 mg into the skin once a week. 6 mL 4   empagliflozin  (JARDIANCE ) 10 MG TABS tablet TAKE 1 TABLET BY MOUTH EVERY DAY BEFORE BREAKFAST 90 tablet 2   escitalopram  (LEXAPRO ) 20 MG tablet Take 1  tablet (20 mg total) by mouth daily. 90 tablet 4   famotidine  (PEPCID ) 20 MG tablet One after supper (Patient taking differently: Take 20 mg by mouth daily.) 30 tablet 11   fluticasone  (FLONASE ) 50 MCG/ACT nasal spray PLACE 1 SPRAY INTO BOTH NOSTRILS 2 (TWO) TIMES DAILY 48 mL 3   furosemide  (LASIX ) 20 MG tablet TAKE 1 TABLET BY MOUTH EVERY DAY 90 tablet 0   gabapentin  (NEURONTIN ) 300 MG capsule Take 2 capsules (600 mg total) by mouth in the morning AND 3 capsules (900 mg total) at bedtime. 450 capsule 4   LORazepam  (ATIVAN ) 0.5 MG tablet TAKE 1/2 TO 1 TABLET BY MOUTH TWICE A DAY AS NEEDED FOR ANXIETY 30 tablet 2   metoprolol  succinate (TOPROL -XL) 25 MG 24 hr tablet Take 1 tablet (25 mg total) by mouth daily. 90 tablet 3   Multiple Vitamin (MULTIVITAMIN WITH MINERALS) TABS tablet Take 1 tablet by mouth daily.     nitroGLYCERIN  (NITROSTAT ) 0.4 MG SL tablet Place 1 tablet (0.4 mg total) under the tongue every 5 (five) minutes as needed. 25 tablet 2   OXYGEN  Inhale 2 L/min into the lungs continuous.     pantoprazole  (PROTONIX ) 40 MG tablet TAKE 1 TABLET BY MOUTH 2 TIMES DAILY 30-60 MIN BEFORE FIRST MEAL OF THE DAY (Patient taking differently: Take 40 mg by mouth at bedtime.) 180 tablet 3   sacubitril -valsartan  (ENTRESTO ) 24-26 MG Take 1 tablet by mouth 2 (two) times daily. 180 tablet 3   spironolactone  (ALDACTONE ) 25 MG tablet Take 1 tablet (25 mg total) by mouth daily. 90 tablet 3   traMADol  (ULTRAM ) 50 MG tablet Take 1 tablet (50 mg total) by mouth every 12 (twelve) hours as needed for moderate pain (pain score 4-6). 20 tablet 0   No facility-administered medications prior to visit.   Review of Systems  Review of Systems  Constitutional: Negative.   HENT:  Positive for postnasal drip.   Respiratory:  Positive for cough.    Physical Exam  LMP 03/08/2013  Physical Exam Constitutional:      General: She is not in acute distress.    Appearance: Normal appearance. She is obese. She is not  ill-appearing.  HENT:     Head: Normocephalic and atraumatic.  Cardiovascular:     Rate and Rhythm: Normal rate and regular rhythm.  Pulmonary:     Effort: Pulmonary effort is normal.     Breath sounds: Wheezing present. No rhonchi.  Neurological:     General: No focal deficit present.     Mental Status: She is alert and oriented to person, place, and time. Mental  status is at baseline.  Psychiatric:        Mood and Affect: Mood normal.        Behavior: Behavior normal.        Thought Content: Thought content normal.        Judgment: Judgment normal.      Lab Results:  CBC    Component Value Date/Time   WBC 12.1 (H) 04/27/2023 1133   RBC 4.44 04/27/2023 1133   HGB 13.3 04/27/2023 1133   HCT 41.6 04/27/2023 1133   PLT 204.0 04/27/2023 1133   MCV 93.7 04/27/2023 1133   MCH 29.5 04/12/2023 0520   MCHC 31.9 04/27/2023 1133   RDW 16.4 (H) 04/27/2023 1133   LYMPHSABS 1.7 04/27/2023 1133   MONOABS 0.7 04/27/2023 1133   EOSABS 0.5 04/27/2023 1133   BASOSABS 0.1 04/27/2023 1133    BMET    Component Value Date/Time   NA 143 04/27/2023 1133   NA 142 06/10/2020 1446   K 4.5 04/27/2023 1133   CL 103 04/27/2023 1133   CO2 32 04/27/2023 1133   GLUCOSE 136 (H) 04/27/2023 1133   BUN 13 04/27/2023 1133   BUN 11 06/10/2020 1446   CREATININE 1.10 04/27/2023 1133   CALCIUM  9.1 04/27/2023 1133   GFRNONAA >60 04/12/2023 0520   GFRAA 65 06/10/2020 1446    BNP    Component Value Date/Time   BNP 117.5 (H) 02/24/2023 2220    ProBNP    Component Value Date/Time   PROBNP 34.0 01/14/2023 1036    Imaging: CUP PACEART REMOTE DEVICE CHECK Result Date: 05/31/2023 ILR summary report received. Battery status OK. Normal device function. No new symptom, tachy, brady, or pause episodes. No new AF episodes. AF burden is 0% of the time.  Monthly summary reports and ROV/PRN ML, CVRS    Assessment & Plan:   1. COPD mixed type (HCC) (Primary)  2. OSA on CPAP  3. Chronic  respiratory failure with hypoxia (HCC)  4. Chronic combined systolic and diastolic CHF (congestive heart failure) (HCC)     Chronic Obstructive Pulmonary Disease (COPD) Stable since hospitalization in November 2024 for acute on chronic respiratory failure. Currently on 2L oxygen  at baseline and using Breztri  inhaler twice daily. Reports change in sputum color. -Increase Mucinex  1200mg  morning and evening for the next two weeks. -Continue Breztri  Aerosphere two puffs twice daily -Use Albuterol  nebulizer three times a day for the next two weeks. -Start Prednisone  taper as directed  -Notify provider if sputum color changes or does not improve with treatment.  Heart Failure Stable on current regimen of aspirin , Entresto , spironolactone , and Lasix . -Continue current medication regimen.  Obstructive Sleep Apnea (OSA) Severe OSA diagnosed in 2016, inconsistent CPAP use. Weight loss noted since last sleep study. -Encouraged consistent CPAP use, with follow-up in six weeks to assess compliance. -Consideration for new CPAP machine if compliance demonstrated.  Postnasal Drip Reports change in sputum color, currently using Flonase  and Zyrtec -D. -Discontinue Zyrtec -D due to potential for increased blood pressure, switch to regular Zyrtec . -Add Astelin  nasal spray. -Notify provider if sputum color changes or does not improve with treatment.  Gastroesophageal Reflux Disease (GERD) Reports some reflux, medication use not specified in conversation. -Continue current medication regimen.      Brenda LELON Ferrari, NP 06/14/2023

## 2023-06-14 NOTE — Patient Instructions (Addendum)
-  CHRONIC OBSTRUCTIVE PULMONARY DISEASE (COPD): COPD is a chronic lung disease that makes it hard to breathe. You are currently stable on 2 liters of oxygen  and using the Breztri  inhaler twice daily. We have increased your Mucinex  to two tablets in the morning and evening for the next two weeks, and you should use your nebulizer three times a day for the next two weeks. We are also starting you on Prednisone . Please notify us  if your sputum color changes or does not improve with treatment.  -HEART FAILURE: Heart failure means your heart is not pumping blood as well as it should. Your condition is stable on your current medications, which include aspirin , Entresto , spironolactone , and Lasix . Continue taking these medications as prescribed.  -OBSTRUCTIVE SLEEP APNEA (OSA): OSA is a condition where your breathing stops and starts repeatedly during sleep. You have severe OSA and have been using a CPAP machine inconsistently. We encourage you to use your CPAP machine consistently and will follow up in six weeks to assess your compliance. If you demonstrate consistent use, we may consider getting you a new CPAP machine.  -POSTNASAL DRIP: Postnasal drip occurs when excess mucus from the nose drips down the back of the throat. You reported a change in your sputum color and are currently using Flonase  and Zyrtec -D. We recommend discontinuing Zyrtec -D due to its potential to increase blood pressure and switching to regular Zyrtec . We are also adding Astelin  nasal spray. Please notify us  if your sputum color changes or does not improve with treatment.  Instruction: - Start new nasal spray, increase mucinex  1,200mg  twice daily, stop zyrtec  D (ok to use regular zyrtec ), use nebulizer three times a day and take prednisone  taper as directed  - Resume CPAP use nightly 4-6 hours or longer - Notify us  if your sputum color changes or does not improve with the treatment for your COPD and postnasal drip.  Orders: POC  qualification, start on 2L   Follow-up: 4-6 week virtual visit for CPAP compliance

## 2023-06-14 NOTE — Addendum Note (Signed)
 Addended by: Gay Filler T on: 06/14/2023 12:19 PM   Modules accepted: Orders

## 2023-06-15 NOTE — Telephone Encounter (Signed)
 ERx

## 2023-07-05 ENCOUNTER — Ambulatory Visit (INDEPENDENT_AMBULATORY_CARE_PROVIDER_SITE_OTHER): Payer: 59

## 2023-07-05 DIAGNOSIS — G459 Transient cerebral ischemic attack, unspecified: Secondary | ICD-10-CM | POA: Diagnosis not present

## 2023-07-05 LAB — CUP PACEART REMOTE DEVICE CHECK
Date Time Interrogation Session: 20250202230316
Implantable Pulse Generator Implant Date: 20211102

## 2023-07-06 ENCOUNTER — Other Ambulatory Visit: Payer: Self-pay | Admitting: Primary Care

## 2023-07-09 ENCOUNTER — Other Ambulatory Visit: Payer: Self-pay | Admitting: Family Medicine

## 2023-07-09 DIAGNOSIS — F39 Unspecified mood [affective] disorder: Secondary | ICD-10-CM

## 2023-07-09 DIAGNOSIS — M1712 Unilateral primary osteoarthritis, left knee: Secondary | ICD-10-CM

## 2023-07-09 DIAGNOSIS — G4733 Obstructive sleep apnea (adult) (pediatric): Secondary | ICD-10-CM | POA: Diagnosis not present

## 2023-07-09 NOTE — Telephone Encounter (Signed)
 Name of Medication:  Lorazepam , Tramadol   Name of Pharmacy:  [facility] Last Fill or Written Date and Quantity:        Lorazepam - 06/13/23, #30      Tramadol - 06/15/23, #20 Last Office Visit and Type:  04/27/23, 6 mo DM f/u Next Office Visit and Type:  10/29/23, CPE Last Controlled Substance Agreement Date:  none Last UDS:  none

## 2023-07-14 NOTE — Telephone Encounter (Signed)
ERx

## 2023-07-15 ENCOUNTER — Telehealth: Payer: Self-pay

## 2023-07-15 ENCOUNTER — Other Ambulatory Visit: Payer: Self-pay | Admitting: Cardiovascular Disease

## 2023-07-15 ENCOUNTER — Other Ambulatory Visit: Payer: Self-pay | Admitting: Family Medicine

## 2023-07-15 DIAGNOSIS — I5032 Chronic diastolic (congestive) heart failure: Secondary | ICD-10-CM

## 2023-07-15 NOTE — Telephone Encounter (Signed)
I called and spoke with Mitch from [employer] to see if he could find a dl for this pt, for cpap compliance. Marthann Schiller stated he would email me to verify if pt has one or not.

## 2023-07-16 ENCOUNTER — Telehealth (INDEPENDENT_AMBULATORY_CARE_PROVIDER_SITE_OTHER): Payer: 59 | Admitting: Primary Care

## 2023-07-16 DIAGNOSIS — G4733 Obstructive sleep apnea (adult) (pediatric): Secondary | ICD-10-CM | POA: Diagnosis not present

## 2023-07-16 DIAGNOSIS — J449 Chronic obstructive pulmonary disease, unspecified: Secondary | ICD-10-CM

## 2023-07-16 NOTE — Progress Notes (Signed)
Virtual Visit via Video Note  I connected with Brenda [name] on 07/16/23 at  4:00 PM EST by a video enabled telemedicine application and verified that I am speaking with the correct person using two identifiers.  Location: Patient: Home Provider: Office   I discussed the limitations of evaluation and management by telemedicine and the availability of in person appointments. The patient expressed understanding and agreed to proceed.  History of Present Illness:  63 year old female, former smoker.  Past medical history significant for COPD Gold 2, OSA on CPAP, chronic respiratory failure, hypertension, cor pulmonale, CHF, coronary artery disease, type 2 diabetes, chronic kidney disease, morbid obesity.  Patient of Dr. Sherene Sires.  Previous [facility] pulmonary encounter:  01/14/2023 Patient presents today for 4 week follow-up.   -She has upper airway wheeze and productive cough with creamy yellow/color -She was seen on July 12th by Dr. Sherene Sires and treated for COPD exacerbation with course of Augmentin and prednisone taper. She saw no improvement after taking medication.  -Using Markus Daft Aerosphere two puffs twice daily -She used her nebulizer this morning without much improvement  -Wearing 2L 24/7  -She reports compliance with Protonix and Pepcid  -She has been taking Mucinex-D  -She has a lot of swelling in legs, she takes lasix 20mg  daily and spirnolactone   -She has gained 5-7 lbs since last visit  -She has CPAP but has not been able to wear it for the last 6 months. Feels like she is suffocating. She has an incline bed.   Airview download 06/12/2020-09/09/2020  06/12/2020 - 09/09/2020 Usage days 28/90 days (31%) Average usage days used 7 hours 16 minutes Pressure 5 to 15 cm H2O (14.4cm h20-95%) AHI 3.0    02/04/2023 She is doing a lot better since last OV in August, treatment for acute exacerbation. She feels back to baseline. She completed prednisone course and states that Zyrtec-d really helped her  symptoms. She is in the process of getting a new CPAP machine  OSA on CPAP - Advised patient resume CPAP use once she gets new machine  - FU in 3 months for compliance check   COPD GOLD 2 - Improved - Continue Breztri Aerosphere 2 puffs q 12 hours (use with spacer) - Continue Albuterol nebulizer q4-6 hours for breakthrough shortness of breath/wheezing   Chronic respiratory failure with hypoxia (HCC) - Stable; Oxygen dependent 2L 24/7   Chronic combined systolic and diastolic CHF (congestive heart failure) (HCC) - Continue lasix 20mg  daily and spironolactone as directed  Upper airway cough syndrome -  Prontonix 40mg  BID and Pepcid 20mg  at bedtime   06/14/2023 Discussed the use of AI scribe software for clinical note transcription with the patient, who gave verbal consent to proceed.  Patient presents today for hospital follow-up. She was admitted in November from 04/03/23-04/12/23 for acute on chronic respiratory failure secondary to COPD exacerbation. Hx diastolic dysfunction and OSA on CPAP.  She required an increased oxygen supply of four liters, up from her baseline of two liters. During her hospital stay, she was treated with nebulizers and a course of prednisone and azithromycin. She was discharged once her oxygen requirements returned to her baseline of two liters and she was deemed stable.  Since her discharge two months ago, the patient reports returning to her baseline health with no significant flare-ups or additional hospitalizations. She has been managing her COPD with a Breztri inhaler twice daily and has rescue nebulizers and inhalers at home.  Regarding her heart failure, she is on aspirin,  a cholesterol medication Entresto, and a diuretic called spironolactone. She reports that her Lasix was held during her hospital stay due to borderline blood pressure but was resumed upon discharge.  The patient also has severe sleep apnea, managed with a CPAP machine, which she admits  to using inconsistently. Her last sleep study was in 2016, showing about fifty-eight apneic events per hour. She has lost about fifteen pounds since her last sleep study.  Recently, the patient has noticed a change in her mucus color to a grayish-brown, which she believes is due to sinus drainage. She is currently managing this with Flonase and Zyrtec D. She also reports some acid reflux, which is being managed with medication.     07/16/2023- Interim  Discussed the use of AI scribe software for clinical note transcription with the patient, who gave verbal consent to proceed.  History of Present Illness   Brenda [name] "Brenda [name]" is a 63 year old female with COPD who presents for a follow-up visit.  She was last seen in January and had been admitted to the hospital in November for a COPD flare-up. Since the last visit, she has not required hospitalization and feels at her baseline. She uses a Breztri inhaler twice daily and a rescue inhaler less than once a day. She has not needed her nebulizer recently. Prednisone was prescribed in January, which helped her symptoms.  She is on continuous oxygen therapy at two liters per minute with no changes in her oxygen requirements. She inquires about qualifying for portable oxygen, as she feels restricted by her current setup.  She has a history of sleep apnea and uses her CPAP machine every night since the last visit, which has improved her sleep quality. She confirms that her CPAP machine is functioning well.   Observations/Objective:  Appears well without acute respiratory symptoms   Assessment and Plan:  1. COPD mixed type (HCC) (Primary)  2. OSA on CPAP     Chronic Obstructive Pulmonary Disease (COPD) Stable, no recent hospitalizations. Using Breztri inhaler BID, rescue inhaler less than once daily, and no recent need for nebulizer. On 2 liters of oxygen. -Continue current regimen. -Check on status of portable oxygen concentrator order with  [employer].  Sleep Apnea Using CPAP consistently and sleeping well. -Continue CPAP use.  Gastroesophageal Reflux Disease (GERD) On daily reflux medication. -Continue current regimen.     Follow Up Instructions:  Follow-up in 3-4 months in office.    I discussed the assessment and treatment plan with the patient. The patient was provided an opportunity to ask questions and all were answered. The patient agreed with the plan and demonstrated an understanding of the instructions.   The patient was advised to call back or seek an in-person evaluation if the symptoms worsen or if the condition fails to improve as anticipated.  I provided 22 minutes of non-face-to-face time during this encounter.   Glenford Bayley, NP

## 2023-07-17 DIAGNOSIS — G4733 Obstructive sleep apnea (adult) (pediatric): Secondary | ICD-10-CM | POA: Diagnosis not present

## 2023-07-21 ENCOUNTER — Telehealth: Payer: Self-pay | Admitting: Primary Care

## 2023-07-21 NOTE — Telephone Encounter (Signed)
Can we check on status of POC with [employer]

## 2023-07-22 ENCOUNTER — Other Ambulatory Visit: Payer: Self-pay | Admitting: Family Medicine

## 2023-07-22 DIAGNOSIS — J449 Chronic obstructive pulmonary disease, unspecified: Secondary | ICD-10-CM

## 2023-07-22 NOTE — Telephone Encounter (Signed)
I contacted Elease Hashimoto with [employer]. She attempted to call the pt yesterday but there was no response. She said she will call the patient today to get the POC sent to this pt's location hopefully by tomorrow.

## 2023-07-22 NOTE — Telephone Encounter (Signed)
Albuterol soln Last filled:  06/24/23, #150 mL Last OV:  04/27/23, 6 mo DM f/ Next OV:  10/29/23, CPE

## 2023-07-23 NOTE — Telephone Encounter (Signed)
Called patient.  Patient states [employer] called her and is bringing the POC out to her home today.  Patient states nothing further needed at this time.

## 2023-07-27 ENCOUNTER — Other Ambulatory Visit: Payer: Self-pay | Admitting: Family Medicine

## 2023-07-27 DIAGNOSIS — H0264 Xanthelasma of left upper eyelid: Secondary | ICD-10-CM | POA: Diagnosis not present

## 2023-07-27 DIAGNOSIS — M1712 Unilateral primary osteoarthritis, left knee: Secondary | ICD-10-CM

## 2023-07-27 DIAGNOSIS — H25813 Combined forms of age-related cataract, bilateral: Secondary | ICD-10-CM | POA: Diagnosis not present

## 2023-07-27 DIAGNOSIS — H04123 Dry eye syndrome of bilateral lacrimal glands: Secondary | ICD-10-CM | POA: Diagnosis not present

## 2023-07-27 DIAGNOSIS — E119 Type 2 diabetes mellitus without complications: Secondary | ICD-10-CM | POA: Diagnosis not present

## 2023-07-27 LAB — HM DIABETES EYE EXAM

## 2023-07-28 NOTE — Telephone Encounter (Signed)
ERx °[place] CSRS reviewed °

## 2023-07-28 NOTE — Telephone Encounter (Signed)
 Name of Medication:  Tramadol  Name of Pharmacy:  [facility] Last Fill or Written Date and Quantity:  07/14/23, #20 Last Office Visit and Type:  04/27/23, 6 mo DM f/u Next Office Visit and Type:  10/29/23, CPE Last Controlled Substance Agreement Date:  none Last UDS:  none

## 2023-07-30 ENCOUNTER — Other Ambulatory Visit: Payer: Self-pay | Admitting: Family Medicine

## 2023-07-30 DIAGNOSIS — F39 Unspecified mood [affective] disorder: Secondary | ICD-10-CM

## 2023-08-02 NOTE — Telephone Encounter (Signed)
 Name of Medication:  Lorazepam Name of Pharmacy:  [facility] [address] Last Fill or Written Date and Quantity:  07/14/23, #30 Last Office Visit and Type:  04/27/23, 6 mo DM f/u Next Office Visit and Type:  10/29/23, CPE Last Controlled Substance Agreement Date:  none Last UDS:  none

## 2023-08-03 NOTE — Telephone Encounter (Signed)
 ERx

## 2023-08-09 ENCOUNTER — Ambulatory Visit: Payer: 59

## 2023-08-09 DIAGNOSIS — G459 Transient cerebral ischemic attack, unspecified: Secondary | ICD-10-CM

## 2023-08-09 LAB — CUP PACEART REMOTE DEVICE CHECK
Date Time Interrogation Session: 20250309230544
Implantable Pulse Generator Implant Date: 20211102

## 2023-08-12 NOTE — Progress Notes (Signed)
 Carelink Summary Report / Loop Recorder

## 2023-08-21 DIAGNOSIS — J449 Chronic obstructive pulmonary disease, unspecified: Secondary | ICD-10-CM | POA: Diagnosis not present

## 2023-08-21 DIAGNOSIS — J4530 Mild persistent asthma, uncomplicated: Secondary | ICD-10-CM | POA: Diagnosis not present

## 2023-08-23 DIAGNOSIS — H04123 Dry eye syndrome of bilateral lacrimal glands: Secondary | ICD-10-CM | POA: Diagnosis not present

## 2023-08-23 DIAGNOSIS — H02135 Senile ectropion of left lower eyelid: Secondary | ICD-10-CM | POA: Diagnosis not present

## 2023-08-23 LAB — HM DIABETES EYE EXAM

## 2023-08-24 ENCOUNTER — Other Ambulatory Visit: Payer: Self-pay | Admitting: Family Medicine

## 2023-08-24 DIAGNOSIS — F39 Unspecified mood [affective] disorder: Secondary | ICD-10-CM

## 2023-08-24 DIAGNOSIS — M1712 Unilateral primary osteoarthritis, left knee: Secondary | ICD-10-CM

## 2023-08-24 NOTE — Telephone Encounter (Signed)
 Name of Medication:  Lorazepam, Tramadol Name of Pharmacy:  [facility] Last Fill or Written Date and Quantity:        Lorazepam- 08/03/23, #30      Tramadol- 07/28/23, #20 Last Office Visit and Type:  04/27/23, 6 mo DM f/u Next Office Visit and Type:  10/29/23, CPE Last Controlled Substance Agreement Date:  none Last UDS:  none

## 2023-08-25 NOTE — Telephone Encounter (Signed)
 ERx

## 2023-08-31 DIAGNOSIS — G4733 Obstructive sleep apnea (adult) (pediatric): Secondary | ICD-10-CM | POA: Diagnosis not present

## 2023-09-07 ENCOUNTER — Other Ambulatory Visit: Payer: Self-pay | Admitting: Family Medicine

## 2023-09-07 DIAGNOSIS — J449 Chronic obstructive pulmonary disease, unspecified: Secondary | ICD-10-CM

## 2023-09-07 NOTE — Telephone Encounter (Signed)
 Will forward to Dr Sharen Hones. Did not feel comfortable know she is using the albuterol that many times a day.

## 2023-09-07 NOTE — Telephone Encounter (Signed)
 Copied from CRM [id number]. Topic: General - Other >> Sep 07, 2023 11:20 AM Saverio Danker wrote: Reason for CRM: Patient is calling back to let someone know   She does 2 puffs at a time 3 times a day

## 2023-09-07 NOTE — Telephone Encounter (Signed)
 [facility] if [facility] still had refills from the rx done 05-18-23 #1 inhaler and 3 refills. She has gotten all 3 refills.  I called and left a message for pt to find out how often and how many puffs she is taking a day.

## 2023-09-09 NOTE — Telephone Encounter (Signed)
 Last saw pulm 06/2023 - at that time plan was albuterol neb TID x2 wks to treat COPD exacerbation (also placed on prednisone course).  If she's having ongoing shortness of breath or signs of COPD exacerbation, recommend return to pulm for further evaluation.

## 2023-09-09 NOTE — Telephone Encounter (Signed)
Lvm asking pt to call back.  Need to relay Dr. G's message.  

## 2023-09-10 NOTE — Telephone Encounter (Signed)
 Spoke to pt. She said she understood and will call Dr Sherene Sires to make an appt.

## 2023-09-10 NOTE — Telephone Encounter (Signed)
 Copied from CRM [id number]. Topic: Clinical - Medical Advice >> Sep 09, 2023  4:05 PM Melissa C wrote: Reason for CRM: patient missed a call regarding a medication refill. Please advise with patient. Thank you.

## 2023-09-13 ENCOUNTER — Ambulatory Visit: Payer: 59

## 2023-09-13 DIAGNOSIS — G459 Transient cerebral ischemic attack, unspecified: Secondary | ICD-10-CM

## 2023-09-13 LAB — CUP PACEART REMOTE DEVICE CHECK
Date Time Interrogation Session: 20250413230530
Implantable Pulse Generator Implant Date: 20211102

## 2023-09-14 ENCOUNTER — Other Ambulatory Visit: Payer: Self-pay | Admitting: Family Medicine

## 2023-09-14 ENCOUNTER — Encounter: Payer: Self-pay | Admitting: Internal Medicine

## 2023-09-14 DIAGNOSIS — F39 Unspecified mood [affective] disorder: Secondary | ICD-10-CM

## 2023-09-14 DIAGNOSIS — M1712 Unilateral primary osteoarthritis, left knee: Secondary | ICD-10-CM

## 2023-09-14 NOTE — Telephone Encounter (Signed)
 ERx

## 2023-09-19 ENCOUNTER — Other Ambulatory Visit: Payer: Self-pay | Admitting: Family Medicine

## 2023-09-19 DIAGNOSIS — J449 Chronic obstructive pulmonary disease, unspecified: Secondary | ICD-10-CM

## 2023-09-28 NOTE — Progress Notes (Signed)
 Carelink Summary Report / Loop Recorder

## 2023-09-30 DIAGNOSIS — J4530 Mild persistent asthma, uncomplicated: Secondary | ICD-10-CM | POA: Diagnosis not present

## 2023-09-30 DIAGNOSIS — J449 Chronic obstructive pulmonary disease, unspecified: Secondary | ICD-10-CM | POA: Diagnosis not present

## 2023-10-01 ENCOUNTER — Other Ambulatory Visit: Payer: Self-pay | Admitting: Family Medicine

## 2023-10-01 DIAGNOSIS — M1712 Unilateral primary osteoarthritis, left knee: Secondary | ICD-10-CM

## 2023-10-01 DIAGNOSIS — F39 Unspecified mood [affective] disorder: Secondary | ICD-10-CM

## 2023-10-04 NOTE — Telephone Encounter (Signed)
 Name of Medication:  Lorazepam , Tramadol  Name of Pharmacy:  [facility] Last Fill or Written Date and Quantity:  09/14/23      Lorazepam - #30      Tramadol -  #20 Last Office Visit and Type:  04/27/23, 6 mo DM f/u Next Office Visit and Type:  10/29/23, CPE Last Controlled Substance Agreement Date:  none Last UDS:  none

## 2023-10-06 NOTE — Telephone Encounter (Signed)
 ERx

## 2023-10-09 ENCOUNTER — Other Ambulatory Visit: Payer: Self-pay | Admitting: Family Medicine

## 2023-10-09 DIAGNOSIS — I5032 Chronic diastolic (congestive) heart failure: Secondary | ICD-10-CM

## 2023-10-11 ENCOUNTER — Other Ambulatory Visit: Payer: Self-pay | Admitting: Cardiovascular Disease

## 2023-10-11 ENCOUNTER — Other Ambulatory Visit: Payer: Self-pay | Admitting: Internal Medicine

## 2023-10-13 DIAGNOSIS — G4733 Obstructive sleep apnea (adult) (pediatric): Secondary | ICD-10-CM | POA: Diagnosis not present

## 2023-10-14 DIAGNOSIS — G4733 Obstructive sleep apnea (adult) (pediatric): Secondary | ICD-10-CM | POA: Diagnosis not present

## 2023-10-14 DIAGNOSIS — J449 Chronic obstructive pulmonary disease, unspecified: Secondary | ICD-10-CM | POA: Diagnosis not present

## 2023-10-18 ENCOUNTER — Ambulatory Visit (INDEPENDENT_AMBULATORY_CARE_PROVIDER_SITE_OTHER): Payer: 59

## 2023-10-18 DIAGNOSIS — G459 Transient cerebral ischemic attack, unspecified: Secondary | ICD-10-CM | POA: Diagnosis not present

## 2023-10-19 ENCOUNTER — Ambulatory Visit: Payer: Self-pay | Admitting: Internal Medicine

## 2023-10-19 ENCOUNTER — Other Ambulatory Visit: Payer: Self-pay | Admitting: Family Medicine

## 2023-10-19 DIAGNOSIS — E559 Vitamin D deficiency, unspecified: Secondary | ICD-10-CM

## 2023-10-19 DIAGNOSIS — E785 Hyperlipidemia, unspecified: Secondary | ICD-10-CM

## 2023-10-19 DIAGNOSIS — N1831 Chronic kidney disease, stage 3a: Secondary | ICD-10-CM

## 2023-10-19 DIAGNOSIS — E119 Type 2 diabetes mellitus without complications: Secondary | ICD-10-CM

## 2023-10-19 LAB — CUP PACEART REMOTE DEVICE CHECK
Date Time Interrogation Session: 20250518230841
Implantable Pulse Generator Implant Date: 20211102

## 2023-10-22 ENCOUNTER — Other Ambulatory Visit (INDEPENDENT_AMBULATORY_CARE_PROVIDER_SITE_OTHER): Payer: 59

## 2023-10-22 DIAGNOSIS — E785 Hyperlipidemia, unspecified: Secondary | ICD-10-CM

## 2023-10-22 DIAGNOSIS — E119 Type 2 diabetes mellitus without complications: Secondary | ICD-10-CM | POA: Diagnosis not present

## 2023-10-22 DIAGNOSIS — E559 Vitamin D deficiency, unspecified: Secondary | ICD-10-CM

## 2023-10-22 DIAGNOSIS — N1831 Chronic kidney disease, stage 3a: Secondary | ICD-10-CM | POA: Diagnosis not present

## 2023-10-22 LAB — VITAMIN D 25 HYDROXY (VIT D DEFICIENCY, FRACTURES): VITD: 24.17 ng/mL — ABNORMAL LOW (ref 30.00–100.00)

## 2023-10-22 LAB — CBC WITH DIFFERENTIAL/PLATELET
Basophils Absolute: 0 10*3/uL (ref 0.0–0.1)
Basophils Relative: 0.4 % (ref 0.0–3.0)
Eosinophils Absolute: 0.3 10*3/uL (ref 0.0–0.7)
Eosinophils Relative: 3 % (ref 0.0–5.0)
HCT: 42 % (ref 36.0–46.0)
Hemoglobin: 13.2 g/dL (ref 12.0–15.0)
Lymphocytes Relative: 18.5 % (ref 12.0–46.0)
Lymphs Abs: 2.1 10*3/uL (ref 0.7–4.0)
MCHC: 31.6 g/dL (ref 30.0–36.0)
MCV: 87.5 fl (ref 78.0–100.0)
Monocytes Absolute: 0.7 10*3/uL (ref 0.1–1.0)
Monocytes Relative: 6 % (ref 3.0–12.0)
Neutro Abs: 8.1 10*3/uL — ABNORMAL HIGH (ref 1.4–7.7)
Neutrophils Relative %: 72.1 % (ref 43.0–77.0)
Platelets: 286 10*3/uL (ref 150.0–400.0)
RBC: 4.79 Mil/uL (ref 3.87–5.11)
RDW: 15.6 % — ABNORMAL HIGH (ref 11.5–15.5)
WBC: 11.3 10*3/uL — ABNORMAL HIGH (ref 4.0–10.5)

## 2023-10-22 LAB — COMPREHENSIVE METABOLIC PANEL WITH GFR
ALT: 16 U/L (ref 0–35)
AST: 14 U/L (ref 0–37)
Albumin: 4.1 g/dL (ref 3.5–5.2)
Alkaline Phosphatase: 141 U/L — ABNORMAL HIGH (ref 39–117)
BUN: 18 mg/dL (ref 6–23)
CO2: 30 meq/L (ref 19–32)
Calcium: 9.5 mg/dL (ref 8.4–10.5)
Chloride: 100 meq/L (ref 96–112)
Creatinine, Ser: 1.1 mg/dL (ref 0.40–1.20)
GFR: 52.82 mL/min — ABNORMAL LOW (ref >60.00)
Glucose, Bld: 137 mg/dL — ABNORMAL HIGH (ref 70–99)
Potassium: 4.5 meq/L (ref 3.5–5.1)
Sodium: 139 meq/L (ref 135–145)
Total Bilirubin: 0.4 mg/dL (ref 0.2–1.2)
Total Protein: 7.1 g/dL (ref 6.0–8.3)

## 2023-10-22 LAB — LIPID PANEL
Cholesterol: 122 mg/dL (ref 0–200)
HDL: 44.4 mg/dL (ref >39.00)
LDL Cholesterol: 49 mg/dL (ref 0–99)
NonHDL: 77.29
Total CHOL/HDL Ratio: 3
Triglycerides: 141 mg/dL (ref 0.0–149.0)
VLDL: 28.2 mg/dL (ref 0.0–40.0)

## 2023-10-22 LAB — VITAMIN B12: Vitamin B-12: 213 pg/mL (ref 211–911)

## 2023-10-22 LAB — MICROALBUMIN / CREATININE URINE RATIO
Creatinine,U: 95.1 mg/dL
Microalb Creat Ratio: 9 mg/g (ref 0.0–30.0)
Microalb, Ur: 0.9 mg/dL (ref 0.0–1.9)

## 2023-10-22 LAB — HEMOGLOBIN A1C: Hgb A1c MFr Bld: 6.9 % — ABNORMAL HIGH (ref 4.6–6.5)

## 2023-10-23 ENCOUNTER — Ambulatory Visit: Payer: Self-pay | Admitting: Family Medicine

## 2023-10-23 LAB — PARATHYROID HORMONE, INTACT (NO CA): PTH: 41 pg/mL (ref 16–77)

## 2023-10-26 ENCOUNTER — Ambulatory Visit (INDEPENDENT_AMBULATORY_CARE_PROVIDER_SITE_OTHER): Payer: 59

## 2023-10-26 VITALS — Ht 60.0 in | Wt 277.0 lb

## 2023-10-26 DIAGNOSIS — Z1231 Encounter for screening mammogram for malignant neoplasm of breast: Secondary | ICD-10-CM | POA: Diagnosis not present

## 2023-10-26 DIAGNOSIS — Z1211 Encounter for screening for malignant neoplasm of colon: Secondary | ICD-10-CM

## 2023-10-26 DIAGNOSIS — Z0000 Encounter for general adult medical examination without abnormal findings: Secondary | ICD-10-CM | POA: Diagnosis not present

## 2023-10-26 NOTE — Progress Notes (Signed)
 Subjective:   Brenda [name] is a 63 y.o. who presents for a Medicare Wellness preventive visit.  As a reminder, Annual Wellness Visits don't include a physical exam, and some assessments may be limited, especially if this visit is performed virtually. We may recommend an in-person follow-up visit with your provider if needed.  Visit Complete: Virtual I connected with  Brenda [name] on 10/26/23 by a audio enabled telemedicine application and verified that I am speaking with the correct person using two identifiers.  Patient Location: Home  Provider Location: Home Office  I discussed the limitations of evaluation and management by telemedicine. The patient expressed understanding and agreed to proceed.  Vital Signs: Because this visit was a virtual/telehealth visit, some criteria may be missing or patient reported. Any vitals not documented were not able to be obtained and vitals that have been documented are patient reported.  VideoDeclined- This patient declined Librarian, academic. Therefore the visit was completed with audio only.  Persons Participating in Visit: Patient.  AWV Questionnaire: No: Patient Medicare AWV questionnaire was not completed prior to this visit.  Cardiac Risk Factors include: advanced age (>8men, >1 women);diabetes mellitus;dyslipidemia;hypertension;obesity (BMI >30kg/m2);sedentary lifestyle     Objective:     Today's Vitals   10/26/23 0938 10/26/23 0939  Weight: 277 lb (125.6 kg)   Height: 5' (1.524 m)   PainSc:  5    Body mass index is 54.1 kg/m.     10/26/2023    9:48 AM 04/03/2023    8:18 PM 02/24/2023    8:48 PM 10/21/2022   11:36 AM 10/15/2021   12:06 PM 11/03/2020    5:00 AM 04/02/2020   10:39 AM  Advanced Directives  Does Patient Have a Medical Advance Directive? Yes No Yes Yes Yes No Yes  Type of Estate agent of [place];Living will  Living will;Healthcare Power of [employer] Power  of [place];Living will Healthcare Power of [place];Living will    Does patient want to make changes to medical advance directive?   No - Patient declined No - Patient declined     Copy of Healthcare Power of Attorney in Chart? Yes - validated most recent copy scanned in chart (See row information)   Yes - validated most recent copy scanned in chart (See row information) Yes - validated most recent copy scanned in chart (See row information)    Would patient like information on creating a medical advance directive?  No - Patient declined    No - Patient declined     Current Medications (verified) Outpatient Encounter Medications as of 10/26/2023  Medication Sig   albuterol  (PROVENTIL ) (2.5 MG/3ML) 0.083% nebulizer solution INHALE 1 VIAL VIA NEBULIZER EVERY 6 HOURS AS NEEDED FOR WHEEZING/SHORTNESS OF BREATH   albuterol  (VENTOLIN  HFA) 108 (90 Base) MCG/ACT inhaler TAKE 2 PUFFS BY MOUTH EVERY 6 HOURS AS NEEDED FOR WHEEZE OR SHORTNESS OF BREATH   ARIPiprazole  (ABILIFY ) 10 MG tablet Take 1 tablet (10 mg total) by mouth daily.   aspirin  EC 81 MG EC tablet Take 1 tablet (81 mg total) by mouth daily. Swallow whole.   atorvastatin  (LIPITOR) 80 MG tablet TAKE 1 TABLET BY MOUTH EVERYDAY AT BEDTIME For cholesterol   Azelastine  HCl 137 MCG/SPRAY SOLN PLACE 2 SPRAYS INTO BOTH NOSTRILS 2 (TWO) TIMES DAILY. USE IN EACH NOSTRIL AS DIRECTED   Budeson-Glycopyrrol-Formoterol  (BREZTRI  AEROSPHERE) 160-9-4.8 MCG/ACT AERO Inhale 2 puffs into the lungs in the morning and at bedtime.   cetirizine -pseudoephedrine (ZYRTEC -D) 5-120  MG tablet Take 1 tablet by mouth every evening.   Cholecalciferol  (VITAMIN D3) 25 MCG (1000 UT) CAPS Take 1 capsule (1,000 Units total) by mouth daily.   dextromethorphan -guaiFENesin  (MUCINEX  DM) 30-600 MG 12hr tablet Take 1 tablet by mouth 2 (two) times daily as needed for cough. (Patient not taking: Reported on 07/16/2023)   Dulaglutide  (TRULICITY ) 0.75 MG/0.5ML SOPN Inject 0.75 mg into the skin  once a week.   empagliflozin  (JARDIANCE ) 10 MG TABS tablet TAKE 1 TABLET BY MOUTH EVERY DAY BEFORE BREAKFAST   ENTRESTO  24-26 MG TAKE 1 TABLET BY MOUTH TWICE A DAY   escitalopram  (LEXAPRO ) 20 MG tablet Take 1 tablet (20 mg total) by mouth daily.   famotidine  (PEPCID ) 20 MG tablet TAKE 1 TABLET EVERY DAY AFTER SUPPER   fluticasone  (FLONASE ) 50 MCG/ACT nasal spray PLACE 1 SPRAY INTO BOTH NOSTRILS 2 (TWO) TIMES DAILY   furosemide  (LASIX ) 20 MG tablet TAKE 1 TABLET BY MOUTH EVERY DAY   gabapentin  (NEURONTIN ) 300 MG capsule Take 2 capsules (600 mg total) by mouth in the morning AND 3 capsules (900 mg total) at bedtime.   LORazepam  (ATIVAN ) 0.5 MG tablet TAKE 1/2 TO 1 TABLET BY MOUTH TWICE A DAY AS NEEDED FOR ANXIETY   metoprolol  succinate (TOPROL -XL) 25 MG 24 hr tablet TAKE 1 TABLET (25 MG TOTAL) BY MOUTH DAILY.   Multiple Vitamin (MULTIVITAMIN WITH MINERALS) TABS tablet Take 1 tablet by mouth daily.   nitroGLYCERIN  (NITROSTAT ) 0.4 MG SL tablet Place 1 tablet (0.4 mg total) under the tongue every 5 (five) minutes as needed.   OXYGEN  Inhale 2 L/min into the lungs continuous.   pantoprazole  (PROTONIX ) 40 MG tablet TAKE 1 TABLET BY MOUTH 2 TIMES DAILY 30-60 MIN BEFORE FIRST MEAL OF THE DAY (Patient taking differently: Take 40 mg by mouth at bedtime.)   spironolactone  (ALDACTONE ) 25 MG tablet TAKE HALF TABLET BY MOUTH DAILY   traMADol  (ULTRAM ) 50 MG tablet Take 1 tablet (50 mg total) by mouth 2 (two) times daily as needed for moderate pain (pain score 4-6).   No facility-administered encounter medications on file as of 10/26/2023.    Allergies (verified) Metolazone   History: Past Medical History:  Diagnosis Date   Asthma    main dyspnea thought due to deconditioning (10/2013)   Bipolar I disorder, most recent episode (or current) manic, unspecified    pt denies this   Bronchitis, chronic obstructive (HCC) 2008 & 2009   FeV1 64% TLC 105% DLCO 55% 2008  -  FeV1 81% FeF 25-75 43% 2009   CAD  (coronary artery disease)    inferior STEMI in 10/2020 s/p DES to RCA   Chronic combined systolic and diastolic CHF (congestive heart failure) (HCC)    LVEF 30-35% in 01/2020 but improved to 60-65% in 05/2021   COPD (chronic obstructive pulmonary disease) (HCC)    emphysema, not an issue per pulm (10/2013)   History of acute pancreatitis 1990   History of pyelonephritis 05/2012   History of ST elevation myocardial infarction (STEMI)    HTN (hypertension)    Hx of migraines    Hyperlipidemia    Knee pain, right    Lumbar disc disease with radiculopathy    multilevel spondylitic changes, scoliosis, and anterolisthesis L4 not thought to currently be surg candidate (Dr. Susen Epstein, [facility]) (Dr. Victory Gravel, [facility])   Nicotine  addiction    Obesity    RSV (respiratory syncytial virus pneumonia) 01/2020   hospitalization   Suicide attempt (HCC) 06/2001   Swelling  of limb    TIA (transient ischemic attack)    Type 2 diabetes mellitus (HCC)    Urge and stress incontinence 07/2012   ([facility])   Past Surgical History:  Procedure Laterality Date   BUBBLE STUDY  04/02/2020   Procedure: BUBBLE STUDY;  Surgeon: Lake Pilgrim, MD;  Location: [facility] ENDOSCOPY;  Service: Cardiovascular;;   CHOLECYSTECTOMY  07/1982   CORONARY/GRAFT ACUTE MI REVASCULARIZATION N/A 11/02/2020   Procedure: Coronary/Graft Acute MI Revascularization;  Surgeon: Sammy Crisp, MD;  Location: [facility] INVASIVE CV LAB;  Service: Cardiovascular;  Laterality: N/A;   CT MAXILLOFACIAL WO/W CM  06/14/06   Left max mucocele   CT of chest  06/14/2006   Normal except c/w active inflammation / infection.  Left renal atrophy   ERCP / sphincterotomy - stenosis  01/15/06   IR ANGIO INTRA EXTRACRAN SEL COM CAROTID INNOMINATE BILAT MOD SED  02/09/2020   IR ANGIO VERTEBRAL SEL SUBCLAVIAN INNOMINATE UNI L MOD SED  02/09/2020   IR ANGIO VERTEBRAL SEL VERTEBRAL UNI R MOD SED  02/09/2020   IR ANGIO VERTEBRAL SEL VERTEBRAL UNI R MOD SED  02/13/2020   IR US   GUIDE VASC ACCESS RIGHT  02/09/2020   IR US  GUIDE VASC ACCESS RIGHT  02/13/2020   LEFT HEART CATH AND CORONARY ANGIOGRAPHY N/A 11/02/2020   Procedure: LEFT HEART CATH AND CORONARY ANGIOGRAPHY;  Surgeon: Sammy Crisp, MD;  Location: [facility] INVASIVE CV LAB;  Service: Cardiovascular;  Laterality: N/A;   LOOP RECORDER INSERTION N/A 04/02/2020   Procedure: LOOP RECORDER INSERTION;  Surgeon: Tammie Fall, MD;  Location: [facility] INVASIVE CV LAB;  Service: Cardiovascular;  Laterality: N/A;   lumbar MRI  11/2010   multilevel spondylitic changes with upper lumbar scoliosis and anterolisthesis of L4 on 5 with some L foraminal narrowing esp at L/3 with concavity of scoliosis, some central stenosis and biforaminal narrowing at L4/5 with spondylolisthesis   [place] - Pneumonia, acute asthma, left maxillary sinusitis  01/11 - 06/18/2006   RADIOLOGY WITH ANESTHESIA N/A 02/13/2020   Procedure: Carotid Stent;  Surgeon: Luellen Sages, MD;  Location: [facility] OR;  Service: Radiology;  Laterality: N/A;   Retained stone     ERCP secondary to retained stone   Right knee surgery  1984,1989,1989,1991,1994   Reconstructions for ACL insuff   TEE WITHOUT CARDIOVERSION N/A 04/02/2020   Procedure: TRANSESOPHAGEAL ECHOCARDIOGRAM (TEE);  Surgeon: Alroy Aspen Lela Purple, MD;  Location: [facility] ENDOSCOPY;  Service: Cardiovascular;  Laterality: N/A;   TUBAL LIGATION     Family History  Problem Relation Age of Onset   Heart disease Brother        MI in early 23's   Hypertension Mother    Hyperlipidemia Mother    Heart disease Mother    Hypertension Father    Asthma Father    Breast cancer Paternal Grandmother    Breast cancer Paternal Aunt    Social History   Socioeconomic History   Marital status: Widowed    Spouse name: Not on file   Number of children: 3   Years of education: Not on file   Highest education level: 12th grade  Occupational History   Occupation: Geographical information systems officer    Employer: [employer]     Employer:  [employer]   Tobacco Use   Smoking status: Former    Current packs/day: 0.00    Average packs/day: 0.3 packs/day for 41.0 years (10.3 ttl pk-yrs)    Types: Cigarettes    Start date: 10/01/1980  Quit date: 10/01/2021    Years since quitting: 2.0    Passive exposure: Current   Smokeless tobacco: Never  Vaping Use   Vaping status: Never Used  Substance and Sexual Activity   Alcohol use: Yes    Alcohol/week: 0.0 standard drinks of alcohol    Comment: occasional   Drug use: Not Currently    Types: Marijuana   Sexual activity: Not on file  Other Topics Concern   Not on file  Social History Narrative   Lives with husband and son and daughter in law and grandchild   Occupation: Media planner at [employer]   Activity: limited by dyspnea   Diet: good water, fruits/vegetables daily   Social Drivers of Corporate investment banker Strain: Low Risk  (10/26/2023)   Overall Financial Resource Strain (CARDIA)    Difficulty of Paying Living Expenses: Not hard at all  Food Insecurity: No Food Insecurity (10/26/2023)   Hunger Vital Sign    Worried About Running Out of Food in the Last Year: Never true    Ran Out of Food in the Last Year: Never true  Transportation Needs: No Transportation Needs (10/26/2023)   PRAPARE - Administrator, Civil Service (Medical): No    Lack of Transportation (Non-Medical): No  Physical Activity: Inactive (10/26/2023)   Exercise Vital Sign    Days of Exercise per Week: 0 days    Minutes of Exercise per Session: 0 min  Stress: No Stress Concern Present (10/26/2023)   [employer] of Occupational Health - Occupational Stress Questionnaire    Feeling of Stress : Not at all  Social Connections: Socially Isolated (10/26/2023)   Social Connection and Isolation Panel [NHANES]    Frequency of Communication with Friends and Family: Three times a week    Frequency of Social Gatherings with Friends and Family: Once a week     Attends Religious Services: Never    Database administrator or Organizations: No    Attends Banker Meetings: Never    Marital Status: Widowed    Tobacco Counseling Counseling given: Not Answered    Clinical Intake:  Pre-visit preparation completed: Yes  Pain : 0-10 Pain Score: 5  Pain Type: Chronic pain Pain Location: Back Pain Orientation: Lower Pain Descriptors / Indicators: Aching Pain Onset: More than a month ago Pain Frequency: Constant Pain Relieving Factors: tramadol , gabapentin  Effect of Pain on Daily Activities: sometimes cannot do as much  Pain Relieving Factors: tramadol , gabapentin   BMI - recorded: 54.1 Nutritional Status: BMI > 30  Obese Nutritional Risks: None Diabetes: Yes CBG done?: No Did pt. bring in CBG monitor from home?: No  Lab Results  Component Value Date   HGBA1C 6.9 (H) 10/22/2023   HGBA1C 6.5 (H) 04/08/2023   HGBA1C 6.4 10/20/2022     How often do you need to have someone help you when you read instructions, pamphlets, or other written materials from your doctor or pharmacy?: 1 - Never  Interpreter Needed?: No  Comments: lives with father Information entered by :: B.Tylisa Alcivar,LPN   Activities of Daily Living     10/26/2023    9:49 AM 04/04/2023    2:00 PM  In your present state of health, do you have any difficulty performing the following activities:  Hearing? 0 0  Vision? 0 0  Difficulty concentrating or making decisions? 0 0  Walking or climbing stairs? 1   Dressing or bathing? 0   Doing errands, shopping? 0  Preparing Food and eating ? N   Using the Toilet? N   In the past six months, have you accidently leaked urine? Y   Do you have problems with loss of bowel control? N   Managing your Medications? N   Managing your Finances? N   Housekeeping or managing your Housekeeping? N     Patient Care Team: Claire Crick, MD as PCP - General O'Neal, Cathay Clonts, MD as PCP - Cardiology (Internal  Medicine) Jonathan Neighbor, [facility] (Inactive) as Pharmacist (Pharmacist)  Indicate any recent Medical Services you may have received from other than [facility] providers in the past year (date may be approximate).     Assessment:    This is a routine wellness examination for Brenda [name].  Hearing/Vision screen Hearing Screening - Comments:: Pt hearing is good Vision Screening - Comments:: Pt says her vision is good w/glasses Dr Micael Adas   Goals Addressed             This Visit's Progress    Patient Stated   Not on track    Would like to get off oxygen .     COMPLETED: Stop or Cut Down Tobacco Use   On track    Timeframe:  Long-Range Goal Priority:  High Start Date:      02/07/21                       Expected End Date:   08/07/21                    Follow Up Date Dec 2022   - cut down number of cigarettes by one-half - use over-the-counter gum, patch or lozenges - use Quit Line 1-800-Quit Now    Why is this important?   To stop or cut down it is important to have support from a person or group of people who you can count on.  You will also need to think about the things that make you feel like smoking, then plan for how to handle them.    Notes:        Depression Screen     10/26/2023    9:46 AM 04/27/2023   11:33 AM 03/16/2023    2:47 PM 10/21/2022   11:35 AM 07/13/2022   12:24 PM 10/15/2021   12:03 PM 10/08/2021   11:55 AM  PHQ 2/9 Scores  PHQ - 2 Score 0 1 0 0 0 1 1  PHQ- 9 Score  4 4  3 1 5     Fall Risk     10/26/2023    9:42 AM 06/14/2023   11:10 AM 04/27/2023   11:33 AM 03/16/2023    2:46 PM 10/21/2022   11:36 AM  Fall Risk   Falls in the past year? 0 0 0 0 0  Number falls in past yr: 0    0  Injury with Fall? 0    0  Risk for fall due to : No Fall Risks    No Fall Risks  Follow up Education provided;Falls prevention discussed    Falls prevention discussed;Falls evaluation completed    MEDICARE RISK AT HOME:  Medicare Risk at Home Any stairs in or around the  home?: No If so, are there any without handrails?: No Home free of loose throw rugs in walkways, pet beds, electrical cords, etc?: Yes Adequate lighting in your home to reduce risk of falls?: Yes Life alert?: No Use of a cane, walker or  w/c?: No Grab bars in the bathroom?: Yes Shower chair or bench in shower?: Yes Elevated toilet seat or a handicapped toilet?: Yes  TIMED UP AND GO:  Was the test performed?  No  Cognitive Function: 6CIT completed        10/26/2023    9:50 AM 10/21/2022   11:39 AM 10/15/2021   12:08 PM  6CIT Screen  What Year? 0 points 0 points 0 points  What month? 0 points 0 points 0 points  What time? 0 points 0 points 0 points  Count back from 20 0 points 0 points 0 points  Months in reverse 0 points 0 points 0 points  Repeat phrase 2 points 2 points 0 points  Total Score 2 points 2 points 0 points    Immunizations Immunization History  Administered Date(s) Administered   Influenza Whole 04/01/2002   Influenza, Seasonal, Injecte, Preservative Fre 03/16/2023   Influenza,inj,Quad PF,6+ Mos 03/21/2015, 07/27/2018, 02/08/2019, 02/23/2020, 04/04/2021, 04/10/2022   Influenza-Unspecified 03/01/2014, 04/22/2016, 03/25/2017   PFIZER(Purple Top)SARS-COV-2 Vaccination 02/09/2020, 03/06/2020   PNEUMOCOCCAL CONJUGATE-20 10/01/2020   Pneumococcal Polysaccharide-23 04/01/2002, 07/24/2015   Td 06/01/1993   Tdap 02/24/2017    Screening Tests Health Maintenance  Topic Date Due   Colonoscopy  Never done   Zoster Vaccines- Shingrix (1 of 2) Never done   COLON CANCER SCREENING ANNUAL FOBT  08/15/2020   COVID-19 Vaccine (3 - 2024-25 season) 01/31/2023   DEXA SCAN  Never done   MAMMOGRAM  11/24/2023   INFLUENZA VACCINE  12/31/2023   HEMOGLOBIN A1C  04/23/2024   FOOT EXAM  04/26/2024   Cervical Cancer Screening (HPV/Pap Cotest)  08/06/2024   OPHTHALMOLOGY EXAM  08/22/2024   Diabetic kidney evaluation - eGFR measurement  10/21/2024   Diabetic kidney evaluation -  Urine ACR  10/21/2024   Medicare Annual Wellness (AWV)  10/25/2024   DTaP/Tdap/Td (3 - Td or Tdap) 02/25/2027   Pneumonia Vaccine 70+ Years old  Completed   Hepatitis C Screening  Completed   HIV Screening  Completed   HPV VACCINES  Aged Out   Meningococcal B Vaccine  Aged Out    Health Maintenance  Health Maintenance Due  Topic Date Due   Colonoscopy  Never done   Zoster Vaccines- Shingrix (1 of 2) Never done   COLON CANCER SCREENING ANNUAL FOBT  08/15/2020   COVID-19 Vaccine (3 - 2024-25 season) 01/31/2023   DEXA SCAN  Never done   Health Maintenance Items Addressed: Mammogram ordered, Cologuard Ordered  Shingrix Vaccine: Patient declined vaccine.  Due, Education has been provided regarding the importance of this vaccine. Advised may receive this vaccine at local pharmacy or Health Dept. Aware to provide a copy of the vaccination record if obtained from local pharmacy or Health Dept. Verbalized acceptance and understanding.   Additional Screening:  Vision Screening: Recommended annual ophthalmology exams for early detection of glaucoma and other disorders of the eye.  Dental Screening: Recommended annual dental exams for proper oral hygiene  Community Resource Referral / Chronic Care Management: CRR required this visit?  No   CCM required this visit?  Appt scheduled with PCP   Plan:    I have personally reviewed and noted the following in the patient's chart:   Medical and social history Use of alcohol, tobacco or illicit drugs  Current medications and supplements including opioid prescriptions. Patient is currently taking opioid prescriptions. Information provided to patient regarding non-opioid alternatives. Patient advised to discuss non-opioid treatment plan with their provider. Functional ability and  status Nutritional status Physical activity Advanced directives List of other physicians Hospitalizations, surgeries, and ER visits in previous 12  months Vitals Screenings to include cognitive, depression, and falls Referrals and appointments  In addition, I have reviewed and discussed with patient certain preventive protocols, quality metrics, and best practice recommendations. A written personalized care plan for preventive services as well as general preventive health recommendations were provided to patient.   Nerissa Bannister, LPN   [id number]   After Visit Summary: (MyChart) Due to this being a telephonic visit, the after visit summary with patients personalized plan was offered to patient via MyChart   Notes: Pt scheduled to come in on Friday. Pls discuss Dexa scan. I did not order as pt did not consent (undecided per pt).

## 2023-10-26 NOTE — Patient Instructions (Signed)
 Brenda [name] , Thank you for taking time out of your busy schedule to complete your Annual Wellness Visit with me. I enjoyed our conversation and look forward to speaking with you again next year. I, as well as your care team,  appreciate your ongoing commitment to your health goals. Please review the following plan we discussed and let me know if I can assist you in the future. Your Game plan/ To Do List    Referrals: If you haven't heard from the office you've been referred to, please reach out to them at the phone provided.  You have an order for:  []   2D Mammogram  [x]   3D Mammogram  []   Bone Density     Please call for appointment:  [facility] [address] [place], [place] [address] [phone]  [facility] [address] [place], [place] [address] [phone]  [facility] [place] [address] [place], [place] [address] [phone]  [facility] [place] [facility] [address] [place], [place] [address] [phone]  [facility] [address] [place], [place] [address] [phone]    Make sure to wear two-piece clothing.  No lotions, powders, or deodorants the day of the appointment. Make sure to bring picture ID and insurance card.  Bring list of medications you are currently taking including any supplements.   Follow up Visits: Next Medicare AWV with our clinical staff: 10/26/24 @ 9:30am   Have you seen your provider in the last 6 months (3 months if uncontrolled diabetes)? No Next Office Visit with your provider: 10/29/23  Clinician Recommendations:  Aim for 30 minutes of exercise or brisk walking, 6-8 glasses of water, and 5 servings of fruits and vegetables each day.       This is a list of the screening recommended for you and due dates:  Health Maintenance  Topic Date Due   Colon Cancer Screening  Never done   Zoster (Shingles) Vaccine (1 of  2) Never done   Stool Blood Test  08/15/2020   COVID-19 Vaccine (3 - 2024-25 season) 01/31/2023   DEXA scan (bone density measurement)  Never done   Medicare Annual Wellness Visit  10/21/2023   Mammogram  11/24/2023   Flu Shot  12/31/2023   Hemoglobin A1C  04/23/2024   Complete foot exam   04/26/2024   Pap with HPV screening  08/06/2024   Eye exam for diabetics  08/22/2024   Yearly kidney function blood test for diabetes  10/21/2024   Yearly kidney health urinalysis for diabetes  10/21/2024   DTaP/Tdap/Td vaccine (3 - Td or Tdap) 02/25/2027   Pneumonia Vaccine  Completed   Hepatitis C Screening  Completed   HIV Screening  Completed   HPV Vaccine  Aged Out   Meningitis B Vaccine  Aged Out    Advanced directives: (In Chart) A copy of your advanced directives are scanned into your chart should your provider ever need it. Advance Care Planning is important because it:  [x]  Makes sure you receive the medical care that is consistent with your values, goals, and preferences  [x]  It provides guidance to your family and loved ones and reduces their decisional burden about whether or not they are making the right decisions based on your wishes.  Follow the link provided in your after visit summary or read over the paperwork we have mailed to you to help you started getting  your Advance Directives in place. If you need assistance in completing these, please reach out to us  so that we can help you!

## 2023-10-28 ENCOUNTER — Other Ambulatory Visit: Payer: Self-pay | Admitting: Family Medicine

## 2023-10-28 DIAGNOSIS — M1712 Unilateral primary osteoarthritis, left knee: Secondary | ICD-10-CM

## 2023-10-28 DIAGNOSIS — F39 Unspecified mood [affective] disorder: Secondary | ICD-10-CM

## 2023-10-28 NOTE — Telephone Encounter (Signed)
 Will send refills at CPE tomorrow.

## 2023-10-29 ENCOUNTER — Ambulatory Visit: Payer: 59 | Admitting: Family Medicine

## 2023-10-29 ENCOUNTER — Encounter: Payer: Self-pay | Admitting: Family Medicine

## 2023-10-29 VITALS — BP 126/70 | HR 72 | Temp 98.7°F | Ht 59.5 in | Wt 277.5 lb

## 2023-10-29 DIAGNOSIS — E1169 Type 2 diabetes mellitus with other specified complication: Secondary | ICD-10-CM | POA: Diagnosis not present

## 2023-10-29 DIAGNOSIS — D72829 Elevated white blood cell count, unspecified: Secondary | ICD-10-CM

## 2023-10-29 DIAGNOSIS — R0609 Other forms of dyspnea: Secondary | ICD-10-CM

## 2023-10-29 DIAGNOSIS — I5032 Chronic diastolic (congestive) heart failure: Secondary | ICD-10-CM | POA: Diagnosis not present

## 2023-10-29 DIAGNOSIS — N3946 Mixed incontinence: Secondary | ICD-10-CM

## 2023-10-29 DIAGNOSIS — J9611 Chronic respiratory failure with hypoxia: Secondary | ICD-10-CM

## 2023-10-29 DIAGNOSIS — I2781 Cor pulmonale (chronic): Secondary | ICD-10-CM | POA: Diagnosis not present

## 2023-10-29 DIAGNOSIS — Z8673 Personal history of transient ischemic attack (TIA), and cerebral infarction without residual deficits: Secondary | ICD-10-CM

## 2023-10-29 DIAGNOSIS — E538 Deficiency of other specified B group vitamins: Secondary | ICD-10-CM

## 2023-10-29 DIAGNOSIS — E559 Vitamin D deficiency, unspecified: Secondary | ICD-10-CM

## 2023-10-29 DIAGNOSIS — E781 Pure hyperglyceridemia: Secondary | ICD-10-CM

## 2023-10-29 DIAGNOSIS — N1831 Chronic kidney disease, stage 3a: Secondary | ICD-10-CM

## 2023-10-29 DIAGNOSIS — F319 Bipolar disorder, unspecified: Secondary | ICD-10-CM

## 2023-10-29 DIAGNOSIS — I2510 Atherosclerotic heart disease of native coronary artery without angina pectoris: Secondary | ICD-10-CM

## 2023-10-29 DIAGNOSIS — I10 Essential (primary) hypertension: Secondary | ICD-10-CM | POA: Diagnosis not present

## 2023-10-29 DIAGNOSIS — G8929 Other chronic pain: Secondary | ICD-10-CM

## 2023-10-29 DIAGNOSIS — Z0000 Encounter for general adult medical examination without abnormal findings: Secondary | ICD-10-CM

## 2023-10-29 DIAGNOSIS — Z7189 Other specified counseling: Secondary | ICD-10-CM

## 2023-10-29 DIAGNOSIS — M5442 Lumbago with sciatica, left side: Secondary | ICD-10-CM

## 2023-10-29 DIAGNOSIS — M5441 Lumbago with sciatica, right side: Secondary | ICD-10-CM | POA: Diagnosis not present

## 2023-10-29 DIAGNOSIS — G894 Chronic pain syndrome: Secondary | ICD-10-CM | POA: Diagnosis not present

## 2023-10-29 DIAGNOSIS — K219 Gastro-esophageal reflux disease without esophagitis: Secondary | ICD-10-CM | POA: Diagnosis not present

## 2023-10-29 DIAGNOSIS — Z6841 Body Mass Index (BMI) 40.0 and over, adult: Secondary | ICD-10-CM

## 2023-10-29 DIAGNOSIS — Z87891 Personal history of nicotine dependence: Secondary | ICD-10-CM

## 2023-10-29 DIAGNOSIS — E785 Hyperlipidemia, unspecified: Secondary | ICD-10-CM

## 2023-10-29 DIAGNOSIS — K760 Fatty (change of) liver, not elsewhere classified: Secondary | ICD-10-CM

## 2023-10-29 DIAGNOSIS — J449 Chronic obstructive pulmonary disease, unspecified: Secondary | ICD-10-CM

## 2023-10-29 MED ORDER — FLUTICASONE PROPIONATE 50 MCG/ACT NA SUSP: 2.0000 | Freq: Every day | NASAL | 3 refills | Status: AC

## 2023-10-29 MED ORDER — ESCITALOPRAM OXALATE 20 MG PO TABS: 20.0000 mg | ORAL_TABLET | Freq: Every day | ORAL | 4 refills | Status: AC

## 2023-10-29 MED ORDER — TRAMADOL HCL 50 MG PO TABS: 50.0000 mg | ORAL_TABLET | Freq: Two times a day (BID) | ORAL | 0 refills | Status: DC | PRN

## 2023-10-29 MED ORDER — ARIPIPRAZOLE 10 MG PO TABS: 10.0000 mg | ORAL_TABLET | Freq: Every day | ORAL | 4 refills | Status: AC

## 2023-10-29 MED ORDER — GABAPENTIN 300 MG PO CAPS: ORAL_CAPSULE | ORAL | 4 refills | Status: AC

## 2023-10-29 MED ORDER — ATORVASTATIN CALCIUM 80 MG PO TABS: ORAL_TABLET | ORAL | 4 refills | Status: AC

## 2023-10-29 MED ORDER — TRULICITY 1.5 MG/0.5ML ~~LOC~~ SOAJ: 1.5000 mg | SUBCUTANEOUS | 3 refills | Status: AC

## 2023-10-29 MED ORDER — FUROSEMIDE 20 MG PO TABS: 20.0000 mg | ORAL_TABLET | Freq: Every day | ORAL | 4 refills | Status: AC

## 2023-10-29 MED ORDER — LORAZEPAM 0.5 MG PO TABS: 0.2500 mg | ORAL_TABLET | Freq: Every evening | ORAL | 0 refills | Status: DC | PRN

## 2023-10-29 MED ORDER — VITAMIN B-12 1000 MCG PO TABS: 1000.0000 ug | ORAL_TABLET | Freq: Every day | ORAL | Status: AC

## 2023-10-29 MED ORDER — ALBUTEROL SULFATE (2.5 MG/3ML) 0.083% IN NEBU: 2.5000 mg | INHALATION_SOLUTION | Freq: Four times a day (QID) | RESPIRATORY_TRACT | 1 refills | Status: DC | PRN

## 2023-10-29 MED ORDER — ALBUTEROL SULFATE HFA 108 (90 BASE) MCG/ACT IN AERS
1.0000 | INHALATION_SPRAY | Freq: Four times a day (QID) | RESPIRATORY_TRACT | 3 refills | Status: DC | PRN
Start: 2023-10-29 — End: 2024-02-28

## 2023-10-29 NOTE — Assessment & Plan Note (Signed)
 Previously discussed.

## 2023-10-29 NOTE — Patient Instructions (Addendum)
 Work on [employer] for stress incontinence.  We will refer you for lung cancer screening program.  If interested, check with pharmacy about new 2 shot shingles series (shingrix).  Increase trulicity  to 1.5mg  weekly - new dose at pharmacy  Start vitamin D3 1000 units daily, start vitamin B12 1000mcg daily.  Try to taper down on lorazepam  use - to sparing use given possible side effects,adverse effects of long term use as discussed.  Good to see you today Return as needed or in 6 months for diabetes follow up visit

## 2023-10-29 NOTE — Assessment & Plan Note (Signed)
 Chronic, stable, seems euvolemic on current regimen.

## 2023-10-29 NOTE — Progress Notes (Signed)
 Ph: [phone] Fax: [phone]   Patient ID: Brenda [name], female    DOB: [birth year], 63 y.o.   MRN: [id number]  This visit was conducted in person.  BP 126/70   Pulse 72   Temp 98.7 F (37.1 C) (Oral)   Ht 4' 11.5" (1.511 m)   Wt 277 lb 8 oz (125.9 kg)   LMP 03/08/2013   SpO2 94% Comment: 2 L O2  BMI 55.11 kg/m    CC: CPE Subjective:   HPI: Brenda [name] is a 63 y.o. female presenting on 10/29/2023 for Annual Exam (MCR prt 2 [AWV- 10/26/23].)   Saw health advisor Tuesday for medicare wellness visit. Note reviewed.   No results found.  Flowsheet Row Office Visit from 10/29/2023 in [facility] [place]  PHQ-2 Total Score 2          10/29/2023   12:32 PM 10/26/2023    9:42 AM 06/14/2023   11:10 AM 04/27/2023   11:33 AM 03/16/2023    2:46 PM  Fall Risk   Falls in the past year? 0 0 0 0 0  Number falls in past yr:  0     Injury with Fall?  0     Risk for fall due to :  No Fall Risks     Follow up  Education provided;Falls prevention discussed      Ex smoker - quit 2023.  O2 dependent asthmatic bronchitis/COPD on breztri  2 puffs BID and oxygen  at 2L via [place] followed by pulm Dr Waymond Hailey. Uses alb inh/neb 1-2x/day. Ongoing exertional dyspnea with walking 20 yards.    Hospitalization summer 2021 with takotsubo cardiomyopathy in setting of RSV pneumonia and COPD exacerbation, re-hospitalized 01/2020 with acute L sided weakness, slurred speech, n/v - found to have basilar artery stenosis with improvement on its own so no stent placed (TIA) - completed DAPT x3 months. Cryptogenic stroke - has implantable monitor.  Subsequent inferior STEMI 10/2020 s/p PCI to distal RCA, s/p aspirin  + brilinta  x1 year now only aspirin  81mg  daily.   Sees cardiology Dr Rolm Clos and EP  Ongoing mixed urinary incontinence for the past 2 years, stress > urge.  Worse symptoms when COPD flares - coughing with dyspnea.  No dysuria, hematuria.  She is on jardiance  as well as  spironolactone  and lasix .  Regularly uses briefs 2/day, as well as less frequently pads 2/day  Preventative: Colon cancer screening - Cologuard normal 10/2022.  Well woman exam - never abnormal cervical screen, normal 08/2014, 09/2015, 07/2019. H/o BTL. Referred to GYN for abnormal cervical exam - exam normal on their evaluation. She declines rpt pap today due to mobility issues. To consider next year.  [facility]   Lung cancer screening - 20+ PY hx - referred today Flu yearly  COVID vaccine Pfizdr 01/2020, 03/2020, no booster Pneumovax 2003, 07/2015, prevnar-20 09/2020 Tdap 2018.  Shingrix - discussed, to consider.  Advanced directives received, scanned 08/2019. Children [facility], Edelmira Goodness are HCPOA. Ok with temporary trial of intubation, but prolonged life support if terminal condition. Seat belt use discussed  Sunscreen use discussed, no changing moles on skin  Ex smoker - quit ~09/2021. 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 Interim medical history since our last visit reviewed. Allergies and medications reviewed and updated. Outpatient Medications Prior to Visit  Medication Sig Dispense Refill   aspirin  EC 81 MG EC tablet Take 1 tablet (81 mg total) by mouth daily. Swallow whole. 30 tablet 0   Azelastine  HCl 137 MCG/SPRAY SOLN PLACE 2 SPRAYS INTO BOTH NOSTRILS 2 (TWO) TIMES DAILY. USE IN EACH NOSTRIL AS DIRECTED 90 mL 1   Budeson-Glycopyrrol-Formoterol  (BREZTRI  AEROSPHERE) 160-9-4.8 MCG/ACT AERO Inhale 2 puffs into the lungs in the morning and at bedtime.     cetirizine -pseudoephedrine (ZYRTEC -D) 5-120 MG tablet Take 1 tablet by mouth every evening.      Cholecalciferol  (VITAMIN D3) 25 MCG (1000 UT) CAPS Take 1 capsule (1,000 Units total) by mouth daily. 30 capsule    dextromethorphan -guaiFENesin  (MUCINEX  DM) 30-600 MG 12hr tablet Take 1 tablet by mouth 2 (two) times daily as needed for cough.     empagliflozin  (JARDIANCE ) 10 MG TABS tablet TAKE 1 TABLET BY MOUTH EVERY DAY BEFORE BREAKFAST 90 tablet 2   ENTRESTO  24-26 MG TAKE 1 TABLET BY MOUTH TWICE A DAY 180 tablet 3   famotidine  (PEPCID ) 20 MG tablet TAKE 1 TABLET EVERY DAY AFTER SUPPER 90 tablet 3   metoprolol  succinate (TOPROL -XL) 25 MG 24 hr tablet TAKE 1 TABLET (25 MG TOTAL) BY MOUTH DAILY. 90 tablet 3   Multiple Vitamin (MULTIVITAMIN WITH MINERALS) TABS tablet Take 1 tablet by mouth daily.     nitroGLYCERIN  (NITROSTAT ) 0.4 MG SL tablet Place 1 tablet (0.4 mg total) under the tongue every 5 (five) minutes as needed. 25 tablet 2   OXYGEN  Inhale 2 L/min into the lungs continuous.     pantoprazole  (PROTONIX ) 40 MG tablet TAKE 1 TABLET BY MOUTH 2 TIMES DAILY 30-60 MIN BEFORE FIRST MEAL OF THE DAY (Patient taking differently: Take 40 mg by mouth at bedtime.) 180 tablet 3   spironolactone  (ALDACTONE ) 25 MG tablet TAKE HALF TABLET BY MOUTH DAILY 45 tablet 1   albuterol  (PROVENTIL ) (2.5 MG/3ML) 0.083% nebulizer solution INHALE 1 VIAL VIA NEBULIZER EVERY 6 HOURS AS NEEDED FOR WHEEZING/SHORTNESS OF BREATH 150 mL 1   albuterol  (VENTOLIN  HFA) 108 (90 Base) MCG/ACT inhaler TAKE 2 PUFFS BY MOUTH EVERY 6 HOURS AS NEEDED FOR WHEEZE OR SHORTNESS OF BREATH 8.5 each 3   ARIPiprazole  (ABILIFY ) 10 MG tablet Take 1 tablet (10 mg total) by mouth daily. 90 tablet 4   atorvastatin  (LIPITOR) 80 MG tablet TAKE 1 TABLET BY MOUTH EVERYDAY AT BEDTIME For cholesterol 90 tablet 4   Dulaglutide  (TRULICITY ) 0.75 MG/0.5ML SOPN Inject 0.75 mg into the skin once a week. 6 mL 4   escitalopram  (LEXAPRO ) 20 MG tablet Take 1 tablet (20 mg total) by mouth daily. 90 tablet 4   fluticasone  (FLONASE ) 50 MCG/ACT nasal spray PLACE 1  SPRAY INTO BOTH NOSTRILS 2 (TWO) TIMES DAILY 48 mL 3   furosemide  (LASIX ) 20 MG tablet TAKE 1 TABLET BY MOUTH EVERY DAY 90 tablet 0   gabapentin  (NEURONTIN ) 300 MG capsule Take 2 capsules (600 mg total) by mouth in the morning AND 3 capsules (900 mg total) at bedtime. 450 capsule 4   LORazepam  (ATIVAN ) 0.5 MG tablet TAKE 1/2 TO 1 TABLET BY MOUTH TWICE A DAY AS NEEDED FOR ANXIETY 30 tablet 0   traMADol  (ULTRAM ) 50 MG tablet Take 1 tablet (50 mg total) by mouth 2 (two) times daily as needed for moderate pain (  pain score 4-6). 20 tablet 0   No facility-administered medications prior to visit.     Per HPI unless specifically indicated in ROS section below Review of Systems  Constitutional:  Negative for activity change, appetite change, chills, fatigue, fever and unexpected weight change.  HENT:  Negative for hearing loss.   Eyes:  Negative for visual disturbance.  Respiratory:  Positive for cough, shortness of breath and wheezing. Negative for chest tightness.   Cardiovascular:  Negative for chest pain, palpitations and leg swelling.  Gastrointestinal:  Positive for constipation (occ). Negative for abdominal distention, abdominal pain, blood in stool, diarrhea, nausea and vomiting.  Genitourinary:  Negative for difficulty urinating and hematuria.  Musculoskeletal:  Negative for arthralgias, myalgias and neck pain.  Skin:  Negative for rash.  Neurological:  Negative for dizziness, seizures, syncope and headaches.  Hematological:  Negative for adenopathy. Does not bruise/bleed easily.  Psychiatric/Behavioral:  Negative for dysphoric mood. The patient is not nervous/anxious.     Objective:  BP 126/70   Pulse 72   Temp 98.7 F (37.1 C) (Oral)   Ht 4' 11.5" (1.511 m)   Wt 277 lb 8 oz (125.9 kg)   LMP 03/08/2013   SpO2 94% Comment: 2 L O2  BMI 55.11 kg/m   Wt Readings from Last 3 Encounters:  10/29/23 277 lb 8 oz (125.9 kg)  10/26/23 277 lb (125.6 kg)  06/14/23 277 lb 9.6 oz (125.9 kg)       Physical Exam Vitals and nursing note reviewed.  Constitutional:      Appearance: Normal appearance. She is not ill-appearing.  HENT:     Head: Normocephalic and atraumatic.     Right Ear: Tympanic membrane, ear canal and external ear normal. There is no impacted cerumen.     Left Ear: Tympanic membrane, ear canal and external ear normal. There is no impacted cerumen.     Mouth/Throat:     Mouth: Mucous membranes are moist.     Pharynx: Oropharynx is clear. No oropharyngeal exudate or posterior oropharyngeal erythema.  Eyes:     General:        Right eye: No discharge.        Left eye: No discharge.     Extraocular Movements: Extraocular movements intact.     Conjunctiva/sclera: Conjunctivae normal.     Pupils: Pupils are equal, round, and reactive to light.  Neck:     Thyroid : No thyroid  mass or thyromegaly.  Cardiovascular:     Rate and Rhythm: Normal rate and regular rhythm.     Pulses: Normal pulses.     Heart sounds: Normal heart sounds. No murmur heard. Pulmonary:     Effort: Pulmonary effort is normal. No respiratory distress.     Breath sounds: Normal breath sounds. No wheezing, rhonchi or rales.  Abdominal:     General: Bowel sounds are normal. There is no distension.     Palpations: Abdomen is soft. There is no mass.     Tenderness: There is no abdominal tenderness. There is no guarding or rebound.     Hernia: No hernia is present.  Musculoskeletal:     Cervical back: Normal range of motion and neck supple. No rigidity.     Right lower leg: No edema.     Left lower leg: No edema.  Lymphadenopathy:     Cervical: No cervical adenopathy.  Skin:    General: Skin is warm and dry.     Findings: No rash.  Neurological:  General: No focal deficit present.     Mental Status: She is alert. Mental status is at baseline.  Psychiatric:        Mood and Affect: Mood normal.        Behavior: Behavior normal.       Results for orders placed or performed in visit  on 10/22/23  Vitamin B12   Collection Time: 10/22/23  8:10 AM  Result Value Ref Range   Vitamin B-12 213 211 - 911 pg/mL  CBC with Differential/Platelet   Collection Time: 10/22/23  8:10 AM  Result Value Ref Range   WBC 11.3 (H) 4.0 - 10.5 K/uL   RBC 4.79 3.87 - 5.11 Mil/uL   Hemoglobin 13.2 12.0 - 15.0 g/dL   HCT 17.6 16.0 - 73.7 %   MCV 87.5 78.0 - 100.0 fl   MCHC 31.6 30.0 - 36.0 g/dL   RDW 10.6 (H) 26.9 - 48.5 %   Platelets 286.0 150.0 - 400.0 K/uL   Neutrophils Relative % 72.1 43.0 - 77.0 %   Lymphocytes Relative 18.5 12.0 - 46.0 %   Monocytes Relative 6.0 3.0 - 12.0 %   Eosinophils Relative 3.0 0.0 - 5.0 %   Basophils Relative 0.4 0.0 - 3.0 %   Neutro Abs 8.1 (H) 1.4 - 7.7 K/uL   Lymphs Abs 2.1 0.7 - 4.0 K/uL   Monocytes Absolute 0.7 0.1 - 1.0 K/uL   Eosinophils Absolute 0.3 0.0 - 0.7 K/uL   Basophils Absolute 0.0 0.0 - 0.1 K/uL  Parathyroid  hormone, intact (no Ca)   Collection Time: 10/22/23  8:10 AM  Result Value Ref Range   PTH 41 16 - 77 pg/mL  Microalbumin / creatinine urine ratio   Collection Time: 10/22/23  8:10 AM  Result Value Ref Range   Microalb, Ur 0.9 0.0 - 1.9 mg/dL   Creatinine,U 46.2 mg/dL   Microalb Creat Ratio 9.0 0.0 - 30.0 mg/g  VITAMIN D  25 Hydroxy (Vit-D Deficiency, Fractures)   Collection Time: 10/22/23  8:10 AM  Result Value Ref Range   VITD 24.17 (L) 30.00 - 100.00 ng/mL  Comprehensive metabolic panel with GFR   Collection Time: 10/22/23  8:10 AM  Result Value Ref Range   Sodium 139 135 - 145 mEq/L   Potassium 4.5 3.5 - 5.1 mEq/L   Chloride 100 96 - 112 mEq/L   CO2 30 19 - 32 mEq/L   Glucose, Bld 137 (H) 70 - 99 mg/dL   BUN 18 6 - 23 mg/dL   Creatinine, Ser 7.03 0.40 - 1.20 mg/dL   Total Bilirubin 0.4 0.2 - 1.2 mg/dL   Alkaline Phosphatase 141 (H) 39 - 117 U/L   AST 14 0 - 37 U/L   ALT 16 0 - 35 U/L   Total Protein 7.1 6.0 - 8.3 g/dL   Albumin 4.1 3.5 - 5.2 g/dL   GFR 50.09 (L) >38.18 mL/min   Calcium  9.5 8.4 - 10.5 mg/dL  Lipid  panel   Collection Time: 10/22/23  8:10 AM  Result Value Ref Range   Cholesterol 122 0 - 200 mg/dL   Triglycerides 299.3 0.0 - 149.0 mg/dL   HDL 71.69 >67.89 mg/dL   VLDL 38.1 0.0 - 01.7 mg/dL   LDL Cholesterol 49 0 - 99 mg/dL   Total CHOL/HDL Ratio 3    NonHDL 77.29   Hemoglobin A1c   Collection Time: 10/22/23  8:10 AM  Result Value Ref Range   Hgb A1c MFr Bld 6.9 (H) 4.6 - 6.5 %  Assessment & Plan:   Problem List Items Addressed This Visit     Health maintenance examination - Primary (Chronic)   Preventative protocols reviewed and updated unless pt declined. Discussed healthy diet and lifestyle.       Advanced care planning/counseling discussion (Chronic)   Previously discussed      HYPERTRIGLYCERIDEMIA   Chronic, stable on current high potency statin - continue.      Relevant Medications   atorvastatin  (LIPITOR) 80 MG tablet   furosemide  (LASIX ) 20 MG tablet   Morbid obesity with BMI of 50.0-59.9, adult (HCC)   Increase trulicity  dose as per above.  Obesity complicated by comorbidities of cor pulmonale, COPD, DM, HTN, HLD, CHF, CAD OA.       Relevant Medications   Dulaglutide  (TRULICITY ) 1.5 MG/0.5ML SOAJ   Leukocytosis   Chronic, mild. Thought due to chronic inflammation      Bipolar disorder (HCC)   Chronic, stable period on lexapro  abilify  and prn lorazepam  - has declined psych eval.       Relevant Medications   LORazepam  (ATIVAN ) 0.5 MG tablet   ARIPiprazole  (ABILIFY ) 10 MG tablet   escitalopram  (LEXAPRO ) 20 MG tablet   Ex-smoker   Congratulated on smoking cessation 2023. 20+ PY hx. Refer to lung cancer screening program.       Relevant Orders   Ambulatory Referral for Lung Cancer Scre   Essential hypertension   Chronic, stable on current regimen - continue      Relevant Medications   atorvastatin  (LIPITOR) 80 MG tablet   furosemide  (LASIX ) 20 MG tablet   Chronic pain syndrome   [place] CSRS reviewed.       Relevant Medications   traMADol   (ULTRAM ) 50 MG tablet   escitalopram  (LEXAPRO ) 20 MG tablet   gabapentin  (NEURONTIN ) 300 MG capsule   Type 2 diabetes mellitus with other specified complication (HCC)   Chronic, overall stable period with A1c 6.9%. Continue jardiance  10mg  through cardiology Increase trulicity  to 1.5mg  weekly, reviewed possible side effects including symptoms of pancreatitis to watch for.  RTC 6 mo DM f/u visit.       Relevant Medications   atorvastatin  (LIPITOR) 80 MG tablet   Dulaglutide  (TRULICITY ) 1.5 MG/0.5ML SOAJ   Chronic hypoxic respiratory failure (HCC)   Cor pulmonale (chronic) (HCC)   Relevant Medications   atorvastatin  (LIPITOR) 80 MG tablet   furosemide  (LASIX ) 20 MG tablet   COPD (chronic obstructive pulmonary disease) (HCC)   Stable period without recent COPD flares on current regimen - appreciate pulm care.      Relevant Medications   albuterol  (PROVENTIL ) (2.5 MG/3ML) 0.083% nebulizer solution   albuterol  (VENTOLIN  HFA) 108 (90 Base) MCG/ACT inhaler   fluticasone  (FLONASE ) 50 MCG/ACT nasal spray   Chronic kidney disease, stage 3a (HCC)   Kidney function stable with GFR 50s - continue to monitor.       Chronic heart failure with preserved ejection fraction (HFpEF, >= 50%) (HCC)   Chronic, stable, seems euvolemic on current regimen.       Relevant Medications   atorvastatin  (LIPITOR) 80 MG tablet   furosemide  (LASIX ) 20 MG tablet   CAD (coronary artery disease)   Continue aspirin , statin.       Relevant Medications   atorvastatin  (LIPITOR) 80 MG tablet   furosemide  (LASIX ) 20 MG tablet   Vitamin D  deficiency   Restart vit D 1000 units daily.       Chronic low back pain   Continue gabapentin  with tramadol  for  breakthrough pain       Relevant Medications   traMADol  (ULTRAM ) 50 MG tablet   escitalopram  (LEXAPRO ) 20 MG tablet   gabapentin  (NEURONTIN ) 300 MG capsule   NAFLD (nonalcoholic fatty liver disease)   DOE (dyspnea on exertion)   Hyperlipidemia   Chronic,  stable on current high potency statin - continue. The ASCVD Risk score (Arnett DK, et al., 2019) failed to calculate for the following reasons:   Risk score cannot be calculated because patient has a medical history suggesting prior/existing ASCVD       Relevant Medications   atorvastatin  (LIPITOR) 80 MG tablet   furosemide  (LASIX ) 20 MG tablet   History of CVA (cerebrovascular accident)   GERD (gastroesophageal reflux disease)   Continues daily PPI.       Mixed urge and stress incontinence   Ongoing trouble stress > urge. No indication for meds at this time.  Discussed kegel exercises and vaginal pessary use - she declines referral.  She is not interested in surgical treatment.  Reorder incontinence supplies - diapers and pads.      Low serum vitamin B12   Restart vit b12 1000mcg daily.         Meds ordered this encounter  Medications   LORazepam  (ATIVAN ) 0.5 MG tablet    Sig: Take 0.5-1 tablets (0.25-0.5 mg total) by mouth at bedtime as needed for anxiety.    Dispense:  30 tablet    Refill:  0    Not to exceed 4 additional fills before 03/12/2024   traMADol  (ULTRAM ) 50 MG tablet    Sig: Take 1 tablet (50 mg total) by mouth 2 (two) times daily as needed for moderate pain (pain score 4-6).    Dispense:  20 tablet    Refill:  0    Not to exceed 4 additional fills before 03/12/2024   albuterol  (PROVENTIL ) (2.5 MG/3ML) 0.083% nebulizer solution    Sig: Take 3 mLs (2.5 mg total) by nebulization every 6 (six) hours as needed for wheezing or shortness of breath.    Dispense:  150 mL    Refill:  1   albuterol  (VENTOLIN  HFA) 108 (90 Base) MCG/ACT inhaler    Sig: Inhale 1-2 puffs into the lungs every 6 (six) hours as needed for wheezing or shortness of breath.    Dispense:  8.5 each    Refill:  3   ARIPiprazole  (ABILIFY ) 10 MG tablet    Sig: Take 1 tablet (10 mg total) by mouth daily.    Dispense:  90 tablet    Refill:  4   atorvastatin  (LIPITOR) 80 MG tablet    Sig: TAKE 1  TABLET BY MOUTH EVERYDAY AT BEDTIME For cholesterol    Dispense:  90 tablet    Refill:  4   escitalopram  (LEXAPRO ) 20 MG tablet    Sig: Take 1 tablet (20 mg total) by mouth daily.    Dispense:  90 tablet    Refill:  4   fluticasone  (FLONASE ) 50 MCG/ACT nasal spray    Sig: Place 2 sprays into both nostrils daily.    Dispense:  48 mL    Refill:  3   furosemide  (LASIX ) 20 MG tablet    Sig: Take 1 tablet (20 mg total) by mouth daily.    Dispense:  90 tablet    Refill:  4   gabapentin  (NEURONTIN ) 300 MG capsule    Sig: Take 2 capsules (600 mg total) by mouth in the morning AND 3 capsules (  900 mg total) at bedtime.    Dispense:  450 capsule    Refill:  4   cyanocobalamin  (VITAMIN B12) 1000 MCG tablet    Sig: Take 1 tablet (1,000 mcg total) by mouth daily.   Dulaglutide  (TRULICITY ) 1.5 MG/0.5ML SOAJ    Sig: Inject 1.5 mg into the skin once a week.    Dispense:  6 mL    Refill:  3    Orders Placed This Encounter  Procedures   Ambulatory Referral for Lung Cancer Scre    Referral Priority:   Routine    Referral Type:   Consultation    Referral Reason:   Specialty Services Required    Number of Visits Requested:   1    Patient Instructions  Work on kegel exercises for stress incontinence.  We will refer you for lung cancer screening program.  If interested, check with pharmacy about new 2 shot shingles series (shingrix).  Increase trulicity  to 1.5mg  weekly - new dose at pharmacy  Start vitamin D3 1000 units daily, start vitamin B12 1000mcg daily.  Try to taper down on lorazepam  use - to sparing use given possible side effects,adverse effects of long term use as discussed.  Good to see you today Return as needed or in 6 months for diabetes follow up visit   Follow up plan: Return in about 6 months (around 04/30/2024) for follow up visit.  Claire Crick, MD

## 2023-10-29 NOTE — Assessment & Plan Note (Signed)
 Continue gabapentin  with tramadol  for breakthrough pain

## 2023-10-29 NOTE — Assessment & Plan Note (Signed)
 Stable period without recent COPD flares on current regimen - appreciate pulm care.

## 2023-10-29 NOTE — Assessment & Plan Note (Signed)
 Chronic, mild. Thought due to chronic inflammation

## 2023-10-29 NOTE — Assessment & Plan Note (Signed)
 Ongoing trouble stress > urge. No indication for meds at this time.  Discussed kegel exercises and vaginal pessary use - she declines referral.  She is not interested in surgical treatment.  Reorder incontinence supplies - diapers and pads.

## 2023-10-29 NOTE — Assessment & Plan Note (Addendum)
 Chronic, overall stable period with A1c 6.9%. Continue jardiance  10mg  through cardiology Increase trulicity  to 1.5mg  weekly, reviewed possible side effects including symptoms of pancreatitis to watch for.  RTC 6 mo DM f/u visit.

## 2023-10-29 NOTE — Assessment & Plan Note (Signed)
[place] CSRS reviewed  ?

## 2023-10-29 NOTE — Assessment & Plan Note (Signed)
 Chronic, stable on current regimen - continue.

## 2023-10-29 NOTE — Assessment & Plan Note (Signed)
 Chronic, stable period on lexapro  abilify  and prn lorazepam  - has declined psych eval.

## 2023-10-29 NOTE — Assessment & Plan Note (Addendum)
 Congratulated on smoking cessation 2023. 20+ PY hx. Refer to lung cancer screening program.

## 2023-10-29 NOTE — Assessment & Plan Note (Signed)
 Chronic, stable on current high potency statin - continue. The ASCVD Risk score (Arnett DK, et al., 2019) failed to calculate for the following reasons:   Risk score cannot be calculated because patient has a medical history suggesting prior/existing ASCVD

## 2023-10-29 NOTE — Assessment & Plan Note (Signed)
 Preventative protocols reviewed and updated unless pt declined. Discussed healthy diet and lifestyle.

## 2023-10-29 NOTE — Assessment & Plan Note (Signed)
 Restart vit D 1000 units daily.

## 2023-10-29 NOTE — Assessment & Plan Note (Addendum)
 Increase trulicity  dose as per above.  Obesity complicated by comorbidities of cor pulmonale, COPD, DM, HTN, HLD, CHF, CAD OA.

## 2023-10-29 NOTE — Assessment & Plan Note (Signed)
 Continue aspirin, statin.

## 2023-10-29 NOTE — Assessment & Plan Note (Signed)
 Kidney function stable with GFR 50s - continue to monitor.

## 2023-10-29 NOTE — Assessment & Plan Note (Signed)
 Chronic, stable on current high potency statin - continue.

## 2023-10-29 NOTE — Assessment & Plan Note (Signed)
 Restart vit b12 1000mcg daily.

## 2023-10-29 NOTE — Assessment & Plan Note (Signed)
Continues daily PPI.  

## 2023-11-04 NOTE — Progress Notes (Signed)
 Carelink Summary Report / Loop Recorder

## 2023-11-15 DIAGNOSIS — G4733 Obstructive sleep apnea (adult) (pediatric): Secondary | ICD-10-CM | POA: Diagnosis not present

## 2023-11-15 DIAGNOSIS — J449 Chronic obstructive pulmonary disease, unspecified: Secondary | ICD-10-CM | POA: Diagnosis not present

## 2023-11-18 ENCOUNTER — Ambulatory Visit (INDEPENDENT_AMBULATORY_CARE_PROVIDER_SITE_OTHER)

## 2023-11-18 DIAGNOSIS — G459 Transient cerebral ischemic attack, unspecified: Secondary | ICD-10-CM | POA: Diagnosis not present

## 2023-11-19 LAB — CUP PACEART REMOTE DEVICE CHECK
Date Time Interrogation Session: 20250618230618
Implantable Pulse Generator Implant Date: 20211102

## 2023-11-21 ENCOUNTER — Ambulatory Visit: Payer: Self-pay | Admitting: Internal Medicine

## 2023-11-21 DIAGNOSIS — J449 Chronic obstructive pulmonary disease, unspecified: Secondary | ICD-10-CM | POA: Diagnosis not present

## 2023-11-21 DIAGNOSIS — J4530 Mild persistent asthma, uncomplicated: Secondary | ICD-10-CM | POA: Diagnosis not present

## 2023-11-25 ENCOUNTER — Other Ambulatory Visit: Payer: Self-pay | Admitting: Family Medicine

## 2023-11-25 ENCOUNTER — Ambulatory Visit
Admission: RE | Admit: 2023-11-25 | Discharge: 2023-11-25 | Disposition: A | Discharge status: Home / Self Care | Source: Ambulatory Visit | Attending: Family Medicine | Admitting: Family Medicine

## 2023-11-25 DIAGNOSIS — Z1231 Encounter for screening mammogram for malignant neoplasm of breast: Secondary | ICD-10-CM

## 2023-11-25 DIAGNOSIS — J449 Chronic obstructive pulmonary disease, unspecified: Secondary | ICD-10-CM

## 2023-11-25 NOTE — Telephone Encounter (Signed)
 Albuterol  soln Last filled:  10/23/23, #150 mL Last OV:  10/29/23, CPE Next OV:  05/01/24,  6 mo DM f/u

## 2023-11-26 ENCOUNTER — Encounter (HOSPITAL_COMMUNITY): Payer: Self-pay | Admitting: Interventional Radiology

## 2023-11-28 ENCOUNTER — Other Ambulatory Visit: Payer: Self-pay | Admitting: Internal Medicine

## 2023-11-28 ENCOUNTER — Other Ambulatory Visit: Payer: Self-pay | Admitting: Family Medicine

## 2023-11-28 DIAGNOSIS — J449 Chronic obstructive pulmonary disease, unspecified: Secondary | ICD-10-CM

## 2023-11-28 DIAGNOSIS — G894 Chronic pain syndrome: Secondary | ICD-10-CM

## 2023-11-28 DIAGNOSIS — F319 Bipolar disorder, unspecified: Secondary | ICD-10-CM

## 2023-11-28 DIAGNOSIS — G8929 Other chronic pain: Secondary | ICD-10-CM

## 2023-11-29 ENCOUNTER — Ambulatory Visit: Payer: Self-pay | Admitting: Family Medicine

## 2023-11-29 NOTE — Telephone Encounter (Signed)
 Name of Medication:  Lorazepam , Tramadol  Name of Pharmacy:  [facility] Last Fill or Written Date and Quantity:  10/29/23      Lorazepam - #30      Tramadol -  #20 Last Office Visit and Type:  10/29/23, CPE Next Office Visit and Type:  05/01/24, 6 mo DM f/u Last Controlled Substance Agreement Date:  none Last UDS:  none

## 2023-11-30 DIAGNOSIS — G4733 Obstructive sleep apnea (adult) (pediatric): Secondary | ICD-10-CM | POA: Diagnosis not present

## 2023-11-30 DIAGNOSIS — J449 Chronic obstructive pulmonary disease, unspecified: Secondary | ICD-10-CM | POA: Diagnosis not present

## 2023-12-02 NOTE — Telephone Encounter (Signed)
 ERx

## 2023-12-10 NOTE — Addendum Note (Signed)
 Addended by: TAWNI DRILLING D on: 12/10/2023 11:16 AM   Modules accepted: Orders

## 2023-12-10 NOTE — Progress Notes (Signed)
 Carelink Summary Report / Loop Recorder

## 2023-12-14 ENCOUNTER — Other Ambulatory Visit: Payer: Self-pay | Admitting: Cardiovascular Disease

## 2023-12-15 ENCOUNTER — Telehealth: Payer: Self-pay

## 2023-12-15 ENCOUNTER — Ambulatory Visit: Payer: Self-pay | Admitting: Primary Care

## 2023-12-15 ENCOUNTER — Ambulatory Visit (INDEPENDENT_AMBULATORY_CARE_PROVIDER_SITE_OTHER): Admitting: Primary Care

## 2023-12-15 ENCOUNTER — Encounter: Payer: Self-pay | Admitting: Primary Care

## 2023-12-15 ENCOUNTER — Ambulatory Visit

## 2023-12-15 VITALS — BP 118/79 | HR 75 | Temp 97.7°F | Ht 59.5 in | Wt 277.4 lb

## 2023-12-15 DIAGNOSIS — J441 Chronic obstructive pulmonary disease with (acute) exacerbation: Secondary | ICD-10-CM

## 2023-12-15 DIAGNOSIS — G4733 Obstructive sleep apnea (adult) (pediatric): Secondary | ICD-10-CM

## 2023-12-15 DIAGNOSIS — Z87891 Personal history of nicotine dependence: Secondary | ICD-10-CM | POA: Diagnosis not present

## 2023-12-15 MED ORDER — PREDNISONE 10 MG PO TABS: ORAL_TABLET | ORAL | 0 refills | Status: DC

## 2023-12-15 MED ORDER — METHYLPREDNISOLONE ACETATE 80 MG/ML IJ SUSP
80.0000 mg | Freq: Once | INTRAMUSCULAR | Status: AC
Start: 2023-12-15 — End: 2023-12-15
  Administered 2023-12-15: 80 mg via INTRAMUSCULAR

## 2023-12-15 MED ORDER — DOXYCYCLINE HYCLATE 100 MG PO TABS: 100.0000 mg | ORAL_TABLET | Freq: Two times a day (BID) | ORAL | 0 refills | Status: DC

## 2023-12-15 NOTE — Patient Instructions (Addendum)
  VISIT SUMMARY: Today, you had a follow-up visit to address your COPD, sleep apnea, and heart failure. You reported increased shortness of breath, wheezing, chest tightness, and occasional productive cough over the past few months. We discussed your current treatments and made some adjustments to help manage your symptoms better.  YOUR PLAN: -COPD WITH ACUTE EXACERBATION: You are experiencing a worsening of your COPD symptoms, likely due to bronchitis. We administered a Depo shot today and prescribed prednisone  to start tomorrow and doxycycline  to start today. You should use Breztri  twice daily and your nebulizer two to three times daily as needed for wheezing or shortness of breath. Mucinex  is recommended to help loosen congestion. If your symptoms worsen, please go to the emergency room.  -OBSTRUCTIVE SLEEP APNEA: You continue to use your CPAP machine every night, but it is outdated and there are issues with accessing the compliance report. Please continue using your CPAP every night. Our nurse will contact the medical supply store to obtain the compliance report.  You are due for new CPAP, I will place an order   -HEART FAILURE: Your heart failure is being managed with Entresto , spironolactone , and Lasix . You experience occasional swelling in your feet and legs, which you manage by taking an extra dose of spironolactone  if the swelling persists for more than a couple of days. Please continue taking your medications as directed and take an extra dose of spironolactone  if the swelling persists.  -GASTRO-ESOPHAGEAL REFLUX DISEASE (GERD): GERD is part of your medical history, but we did not make any changes to your management plan during this visit.  INSTRUCTIONS: Please follow up with a chest x-ray as ordered. Continue using your CPAP machine every night and take your medications as directed. If your symptoms worsen, especially your breathing, go to the emergency room  immediately.  Orders: Depo-medrol  80mg  IM x1 CXR   Rx: Prednisone  and Doxycyline  Follow-up 2-4 weeks with Beth or Dr. Darlean- can be virtual

## 2023-12-15 NOTE — Telephone Encounter (Signed)
 I called and spoke to [facility] with [employer]. Since I was unable to find compliance in [place], Brenda [name] looked into this for me. Brenda [name] states that her machine is old and not pulling data but she is eligible for a new one. NFN

## 2023-12-15 NOTE — Addendum Note (Signed)
 Addended by: Schyler Counsell T on: 12/15/2023 03:36 PM   Modules accepted: Orders

## 2023-12-15 NOTE — Progress Notes (Signed)
 Please let patient know CXR showed no acute cardiopulmonary disease, negative for pneumonia

## 2023-12-15 NOTE — Progress Notes (Signed)
 @Patient  ID: Brenda [name], female    DOB: November 15, 1960, 63 y.o.   MRN: [id number]  No chief complaint on file.   Referring provider: Rilla Baller, MD  HPI: 63 year old female, former smoker.  Past medical history significant for COPD Gold 2, OSA on CPAP, chronic respiratory failure, hypertension, cor pulmonale, CHF, coronary artery disease, type 2 diabetes, chronic kidney disease, morbid obesity.  Patient of Dr. Darlean.  Previous [facility] pulmonary encounter:  01/14/2023 Patient presents today for 4 week follow-up.   -She has upper airway wheeze and productive cough with creamy yellow/color -She was seen on July 12th by Dr. Darlean and treated for COPD exacerbation with course of Augmentin  and prednisone  taper. She saw no improvement after taking medication.  -Using Breztri  Aerosphere two puffs twice daily -She used her nebulizer this morning without much improvement  -Wearing 2L 24/7  -She reports compliance with Protonix  and Pepcid   -She has been taking Mucinex -D  -She has a lot of swelling in legs, she takes lasix  20mg  daily and spirnolactone   -She has gained 5-7 lbs since last visit  -She has CPAP but has not been able to wear it for the last 6 months. Feels like she is suffocating. She has an incline bed.   [employer] download 06/12/2020-09/09/2020  06/12/2020 - 09/09/2020 Usage days 28/90 days (31%) Average usage days used 7 hours 16 minutes Pressure 5 to 15 cm H2O (14.4cm h20-95%) AHI 3.0    02/04/2023 She is doing a lot better since last OV in August, treatment for acute exacerbation. She feels back to baseline. She completed prednisone  course and states that Zyrtec -d really helped her symptoms. She is in the process of getting a new CPAP machine  OSA on CPAP - Advised patient resume CPAP use once she gets new machine  - FU in 3 months for compliance check   COPD GOLD 2 - Improved - Continue Breztri  Aerosphere 2 puffs q 12 hours (use with spacer) - Continue Albuterol  nebulizer  q4-6 hours for breakthrough shortness of breath/wheezing   Chronic respiratory failure with hypoxia (HCC) - Stable; Oxygen  dependent 2L 24/7   Chronic combined systolic and diastolic CHF (congestive heart failure) (HCC) - Continue lasix  20mg  daily and spironolactone  as directed  Upper airway cough syndrome -  Prontonix 40mg  BID and Pepcid  20mg  at bedtime   06/14/2023 Discussed the use of AI scribe software for clinical note transcription with the patient, who gave verbal consent to proceed.  Patient presents today for hospital follow-up. She was admitted in November from 04/03/23-04/12/23 for acute on chronic respiratory failure secondary to COPD exacerbation. Hx diastolic dysfunction and OSA on CPAP.  She required an increased oxygen  supply of four liters, up from her baseline of two liters. During her hospital stay, she was treated with nebulizers and a course of prednisone  and azithromycin . She was discharged once her oxygen  requirements returned to her baseline of two liters and she was deemed stable.  Since her discharge two months ago, the patient reports returning to her baseline health with no significant flare-ups or additional hospitalizations. She has been managing her COPD with a Breztri  inhaler twice daily and has rescue nebulizers and inhalers at home.  Regarding her heart failure, she is on aspirin , a cholesterol medication Entresto , and a diuretic called spironolactone . She reports that her Lasix  was held during her hospital stay due to borderline blood pressure but was resumed upon discharge.  The patient also has severe sleep apnea, managed with a CPAP machine, which  she admits to using inconsistently. Her last sleep study was in 2016, showing about fifty-eight apneic events per hour. She has lost about fifteen pounds since her last sleep study.  Recently, the patient has noticed a change in her mucus color to a grayish-brown, which she believes is due to sinus drainage. She is  currently managing this with Flonase  and Zyrtec  D. She also reports some acid reflux, which is being managed with medication.     07/16/2023- Interim  Discussed the use of AI scribe software for clinical note transcription with the patient, who gave verbal consent to proceed.  History of Present Illness   Brenda [name] is a 63 year old female with COPD who presents for a follow-up visit.  She was last seen in January and had been admitted to the hospital in November for a COPD flare-up. Since the last visit, she has not required hospitalization and feels at her baseline. She uses a Breztri  inhaler twice daily and a rescue inhaler less than once a day. She has not needed her nebulizer recently. Prednisone  was prescribed in January, which helped her symptoms.  She is on continuous oxygen  therapy at two liters per minute with no changes in her oxygen  requirements. She inquires about qualifying for portable oxygen , as she feels restricted by her current setup.  She has a history of sleep apnea and uses her CPAP machine every night since the last visit, which has improved her sleep quality. She confirms that her CPAP machine is functioning well.    Assessment and Plan:  1. COPD mixed type (HCC) (Primary)  2. OSA on CPAP   Chronic Obstructive Pulmonary Disease (COPD) Stable, no recent hospitalizations. Using Breztri  inhaler BID, rescue inhaler less than once daily, and no recent need for nebulizer. On 2 liters of oxygen . -Continue current regimen.  -Check on status of portable oxygen  concentrator order with [employer].  Sleep Apnea Using CPAP consistently and sleeping well. -Continue CPAP use.  Gastroesophageal Reflux Disease (GERD) On daily reflux medication. -Continue current regimen.     12/15/2023- Interim hx  Patient presents today for 3-4 month follow-up COPD, OSA and GERD.  Breztri  2L oxygen  CPAP       Allergies  Allergen Reactions   Metolazone Other (See Comments)     Metabolic mood swings    Immunization History  Administered Date(s) Administered   Influenza Whole 04/01/2002   Influenza, Seasonal, Injecte, Preservative Fre 03/16/2023   Influenza,inj,Quad PF,6+ Mos 03/21/2015, 07/27/2018, 02/08/2019, 02/23/2020, 04/04/2021, 04/10/2022   Influenza-Unspecified 03/01/2014, 04/22/2016, 03/25/2017   PFIZER(Purple Top)SARS-COV-2 Vaccination 02/09/2020, 03/06/2020   PNEUMOCOCCAL CONJUGATE-20 10/01/2020   Pneumococcal Polysaccharide-23 04/01/2002, 07/24/2015   Td 06/01/1993   Tdap 02/24/2017    Past Medical History:  Diagnosis Date   Asthma    main dyspnea thought due to deconditioning (10/2013)   Bipolar I disorder, most recent episode (or current) manic, unspecified    pt denies this   Bronchitis, chronic obstructive (HCC) 2008 & 2009   FeV1 64% TLC 105% DLCO 55% 2008  -  FeV1 81% FeF 25-75 43% 2009   CAD (coronary artery disease)    inferior STEMI in 10/2020 s/p DES to RCA   Chronic combined systolic and diastolic CHF (congestive heart failure) (HCC)    LVEF 30-35% in 01/2020 but improved to 60-65% in 05/2021   COPD (chronic obstructive pulmonary disease) (HCC)    emphysema, not an issue per pulm (10/2013)   History of acute pancreatitis 1990   History of  pyelonephritis 05/2012   History of ST elevation myocardial infarction (STEMI)    HTN (hypertension)    Hx of migraines    Hyperlipidemia    Knee pain, right    Lumbar disc disease with radiculopathy    multilevel spondylitic changes, scoliosis, and anterolisthesis L4 not thought to currently be surg candidate (Dr. Amon, [facility]) (Dr. Mindi, [facility])   Nicotine  addiction    Obesity    RSV (respiratory syncytial virus pneumonia) 01/2020   hospitalization   Suicide attempt (HCC) 06/2001   Swelling of limb    TIA (transient ischemic attack)    Type 2 diabetes mellitus (HCC)    Urge and stress incontinence 07/2012   ([facility])    Tobacco History: Social History   Tobacco Use   Smoking Status Former   Current packs/day: 0.00   Average packs/day: 0.3 packs/day for 41.0 years (10.3 ttl pk-yrs)   Types: Cigarettes   Start date: 10/01/1980   Quit date: 10/01/2021   Years since quitting: 2.2   Passive exposure: Current  Smokeless Tobacco Never   Counseling given: Not Answered   Outpatient Medications Prior to Visit  Medication Sig Dispense Refill   albuterol  (PROVENTIL ) (2.5 MG/3ML) 0.083% nebulizer solution INHALE 1 VIAL VIA NEBULIZER EVERY 6 HOURS AS NEEDED FOR WHEEZING/SHORTNESS OF BREATH 150 mL 1   albuterol  (VENTOLIN  HFA) 108 (90 Base) MCG/ACT inhaler Inhale 1-2 puffs into the lungs every 6 (six) hours as needed for wheezing or shortness of breath. 8.5 each 3   ARIPiprazole  (ABILIFY ) 10 MG tablet Take 1 tablet (10 mg total) by mouth daily. 90 tablet 4   aspirin  EC 81 MG EC tablet Take 1 tablet (81 mg total) by mouth daily. Swallow whole. 30 tablet 0   atorvastatin  (LIPITOR) 80 MG tablet TAKE 1 TABLET BY MOUTH EVERYDAY AT BEDTIME For cholesterol 90 tablet 4   Azelastine  HCl 137 MCG/SPRAY SOLN PLACE 2 SPRAYS INTO BOTH NOSTRILS 2 (TWO) TIMES DAILY. USE IN EACH NOSTRIL AS DIRECTED 90 mL 1   BREZTRI  AEROSPHERE 160-9-4.8 MCG/ACT AERO inhaler INHALE 2 PUFFS INTO LUNGS 2 TIMES DAILY 10.7 each 11   cetirizine -pseudoephedrine (ZYRTEC -D) 5-120 MG tablet Take 1 tablet by mouth every evening.     Cholecalciferol  (VITAMIN D3) 25 MCG (1000 UT) CAPS Take 1 capsule (1,000 Units total) by mouth daily. 30 capsule    cyanocobalamin  (VITAMIN B12) 1000 MCG tablet Take 1 tablet (1,000 mcg total) by mouth daily.     dextromethorphan -guaiFENesin  (MUCINEX  DM) 30-600 MG 12hr tablet Take 1 tablet by mouth 2 (two) times daily as needed for cough.     Dulaglutide  (TRULICITY ) 1.5 MG/0.5ML SOAJ Inject 1.5 mg into the skin once a week. 6 mL 3   empagliflozin  (JARDIANCE ) 10 MG TABS tablet TAKE 1 TABLET BY MOUTH EVERY DAY BEFORE BREAKFAST 90 tablet 2   ENTRESTO  24-26 MG TAKE 1 TABLET BY MOUTH TWICE  A DAY 180 tablet 3   escitalopram  (LEXAPRO ) 20 MG tablet Take 1 tablet (20 mg total) by mouth daily. 90 tablet 4   famotidine  (PEPCID ) 20 MG tablet TAKE 1 TABLET EVERY DAY AFTER SUPPER 90 tablet 3   fluticasone  (FLONASE ) 50 MCG/ACT nasal spray Place 2 sprays into both nostrils daily. 48 mL 3   furosemide  (LASIX ) 20 MG tablet Take 1 tablet (20 mg total) by mouth daily. 90 tablet 4   gabapentin  (NEURONTIN ) 300 MG capsule Take 2 capsules (600 mg total) by mouth in the morning AND 3 capsules (900 mg total) at bedtime. 450  capsule 4   LORazepam  (ATIVAN ) 0.5 MG tablet TAKE 0.5-1 TABLETS (0.25-0.5 MG TOTAL) BY MOUTH AT BEDTIME AS NEEDED FOR ANXIETY. 30 tablet 0   metoprolol  succinate (TOPROL -XL) 25 MG 24 hr tablet TAKE 1 TABLET (25 MG TOTAL) BY MOUTH DAILY. 90 tablet 3   Multiple Vitamin (MULTIVITAMIN WITH MINERALS) TABS tablet Take 1 tablet by mouth daily.     nitroGLYCERIN  (NITROSTAT ) 0.4 MG SL tablet Place 1 tablet (0.4 mg total) under the tongue every 5 (five) minutes as needed. 25 tablet 2   OXYGEN  Inhale 2 L/min into the lungs continuous.     pantoprazole  (PROTONIX ) 40 MG tablet TAKE 1 TABLET BY MOUTH 2 TIMES DAILY 30-60 MIN BEFORE FIRST MEAL OF THE DAY (Patient taking differently: Take 40 mg by mouth at bedtime.) 180 tablet 3   spironolactone  (ALDACTONE ) 25 MG tablet TAKE 1 TABLET (25 MG TOTAL) BY MOUTH DAILY. 30 tablet 0   traMADol  (ULTRAM ) 50 MG tablet Take 1 tablet (50 mg total) by mouth 2 (two) times daily as needed. 20 tablet 0   No facility-administered medications prior to visit.   Review of Systems  Review of Systems  Constitutional: Negative.   HENT: Negative.    Respiratory:  Positive for cough, shortness of breath and wheezing.   Cardiovascular: Negative.  Negative for leg swelling.    Physical Exam  LMP 03/08/2013  Physical Exam Constitutional:      Appearance: Normal appearance. She is obese.  HENT:     Head: Normocephalic and atraumatic.     Mouth/Throat:     Mouth:  Mucous membranes are moist.     Pharynx: Oropharynx is clear.  Cardiovascular:     Rate and Rhythm: Normal rate and regular rhythm.  Pulmonary:     Effort: Pulmonary effort is normal. No respiratory distress.     Breath sounds: Wheezing present. No rhonchi.  Musculoskeletal:        General: Normal range of motion.  Skin:    General: Skin is warm and dry.  Neurological:     General: No focal deficit present.     Mental Status: She is alert and oriented to person, place, and time. Mental status is at baseline.  Psychiatric:        Mood and Affect: Mood normal.        Behavior: Behavior normal.        Thought Content: Thought content normal.        Judgment: Judgment normal.      Lab Results:  CBC    Component Value Date/Time   WBC 11.3 (H) 10/22/2023 0810   RBC 4.79 10/22/2023 0810   HGB 13.2 10/22/2023 0810   HCT 42.0 10/22/2023 0810   PLT 286.0 10/22/2023 0810   MCV 87.5 10/22/2023 0810   MCH 29.5 04/12/2023 0520   MCHC 31.6 10/22/2023 0810   RDW 15.6 (H) 10/22/2023 0810   LYMPHSABS 2.1 10/22/2023 0810   MONOABS 0.7 10/22/2023 0810   EOSABS 0.3 10/22/2023 0810   BASOSABS 0.0 10/22/2023 0810    BMET    Component Value Date/Time   NA 139 10/22/2023 0810   NA 142 06/10/2020 1446   K 4.5 10/22/2023 0810   CL 100 10/22/2023 0810   CO2 30 10/22/2023 0810   GLUCOSE 137 (H) 10/22/2023 0810   BUN 18 10/22/2023 0810   BUN 11 06/10/2020 1446   CREATININE 1.10 10/22/2023 0810   CALCIUM  9.5 10/22/2023 0810   GFRNONAA >60 04/12/2023 0520   GFRAA  65 06/10/2020 1446    BNP    Component Value Date/Time   BNP 117.5 (H) 02/24/2023 2220    ProBNP    Component Value Date/Time   PROBNP 34.0 01/14/2023 1036    Imaging: MM 3D SCREENING MAMMOGRAM BILATERAL BREAST Result Date: 11/29/2023 CLINICAL DATA:  Screening. EXAM: DIGITAL SCREENING BILATERAL MAMMOGRAM WITH TOMOSYNTHESIS AND CAD TECHNIQUE: Bilateral screening digital craniocaudal and mediolateral oblique mammograms  were obtained. Bilateral screening digital breast tomosynthesis was performed. The images were evaluated with computer-aided detection. COMPARISON:  Previous exam(s). ACR Breast Density Category a: The breasts are almost entirely fatty. FINDINGS: There are no findings suspicious for malignancy. IMPRESSION: No mammographic evidence of malignancy. A result letter of this screening mammogram will be mailed directly to the patient. RECOMMENDATION: Screening mammogram in one year. (Code:SM-B-01Y) BI-RADS CATEGORY  1: Negative. Electronically Signed   By: Norleen Croak M.D.   On: 11/29/2023 10:54   CUP PACEART REMOTE DEVICE CHECK Result Date: 11/19/2023 ILR summary report received. Battery status OK. Normal device function. No new symptom, tachy, brady, or pause episodes. No new AF episodes. Monthly summary reports and ROV/PRN LA, CVRS    Assessment & Plan:   1. COPD with acute exacerbation (HCC) (Primary) - DG Chest 2 View; Future  2. OSA on CPAP - Ambulatory Referral for DME   Assessment and Plan    COPD with acute exacerbation Experiencing an acute exacerbation characterized by increased dyspnea, wheezing, chest tightness, and occasional productive cough with yellow sputum. Symptoms have worsened over the past few months. Wheezing present on lung examination. Exacerbation likely due to bronchitis, as indicated by yellow sputum and congestion. - Administer Depo-medrol  80mg  shot today - Prescribe prednisone  taper to start tomorrow - Prescribe doxycycline  100mg  twice daily x 7 days - Order chest x-ray - Instruct to use Breztri  twice daily - Advise to use nebulizer two to three times daily as needed for wheezing or dyspnea - Recommend Mucinex  to loosen congestion - Advise to go to the emergency room if symptoms worsen  Obstructive sleep apnea Severe OSA. Patient reports compliance with use nightly. Current CPAP machine is outdated, download not available- Due for replacement CPAP machine -  Order new CPAP through [employer] auto settings 5-20cm h20   Heart failure On Entresto , spironolactone , and Lasix  for management. Experiences occasional peripheral edema, managed by taking an extra dose of spironolactone  if edema persists for more than a couple of days. Weight has remained stable since January 2025. - Continue Entresto , spironolactone , and Lasix  as directed  Almarie LELON Ferrari, NP 12/15/2023

## 2023-12-16 NOTE — Progress Notes (Signed)
 I called and spoke to pt. Pt informed of Beth's note and verbalized understanding. NFN

## 2023-12-20 ENCOUNTER — Ambulatory Visit

## 2023-12-20 DIAGNOSIS — G459 Transient cerebral ischemic attack, unspecified: Secondary | ICD-10-CM

## 2023-12-21 DIAGNOSIS — J4530 Mild persistent asthma, uncomplicated: Secondary | ICD-10-CM | POA: Diagnosis not present

## 2023-12-21 DIAGNOSIS — J449 Chronic obstructive pulmonary disease, unspecified: Secondary | ICD-10-CM | POA: Diagnosis not present

## 2023-12-22 LAB — CUP PACEART REMOTE DEVICE CHECK
Date Time Interrogation Session: 20250720230856
Implantable Pulse Generator Implant Date: 20211102

## 2023-12-26 ENCOUNTER — Ambulatory Visit: Payer: Self-pay | Admitting: Internal Medicine

## 2023-12-31 DIAGNOSIS — G4733 Obstructive sleep apnea (adult) (pediatric): Secondary | ICD-10-CM | POA: Diagnosis not present

## 2023-12-31 DIAGNOSIS — J449 Chronic obstructive pulmonary disease, unspecified: Secondary | ICD-10-CM | POA: Diagnosis not present

## 2024-01-04 ENCOUNTER — Other Ambulatory Visit: Payer: Self-pay | Admitting: Family Medicine

## 2024-01-04 DIAGNOSIS — G894 Chronic pain syndrome: Secondary | ICD-10-CM

## 2024-01-04 DIAGNOSIS — F319 Bipolar disorder, unspecified: Secondary | ICD-10-CM

## 2024-01-04 DIAGNOSIS — G8929 Other chronic pain: Secondary | ICD-10-CM

## 2024-01-05 NOTE — Telephone Encounter (Signed)
 Name of Medication:  Lorazepam , Tramadol  Name of Pharmacy:  [facility] Last Fill or Written Date and Quantity:  12/02/23      Lorazepam - #30      Tramadol -  #20 Last Office Visit and Type:  10/29/23, CPE Next Office Visit and Type:  05/01/24, 6 mo DM f/u Last Controlled Substance Agreement Date:  none Last UDS:  none

## 2024-01-07 NOTE — Telephone Encounter (Signed)
 ERx

## 2024-01-14 DIAGNOSIS — G4733 Obstructive sleep apnea (adult) (pediatric): Secondary | ICD-10-CM | POA: Diagnosis not present

## 2024-01-14 DIAGNOSIS — J449 Chronic obstructive pulmonary disease, unspecified: Secondary | ICD-10-CM | POA: Diagnosis not present

## 2024-01-20 ENCOUNTER — Ambulatory Visit (INDEPENDENT_AMBULATORY_CARE_PROVIDER_SITE_OTHER)

## 2024-01-20 DIAGNOSIS — G459 Transient cerebral ischemic attack, unspecified: Secondary | ICD-10-CM

## 2024-01-20 LAB — CUP PACEART REMOTE DEVICE CHECK
Date Time Interrogation Session: 20250820230644
Implantable Pulse Generator Implant Date: 20211102

## 2024-01-21 ENCOUNTER — Telehealth (HOSPITAL_BASED_OUTPATIENT_CLINIC_OR_DEPARTMENT_OTHER): Admitting: Primary Care

## 2024-01-21 ENCOUNTER — Telehealth: Payer: Self-pay | Admitting: Primary Care

## 2024-01-21 DIAGNOSIS — G4733 Obstructive sleep apnea (adult) (pediatric): Secondary | ICD-10-CM

## 2024-01-21 DIAGNOSIS — I5042 Chronic combined systolic (congestive) and diastolic (congestive) heart failure: Secondary | ICD-10-CM | POA: Diagnosis not present

## 2024-01-21 DIAGNOSIS — J449 Chronic obstructive pulmonary disease, unspecified: Secondary | ICD-10-CM | POA: Diagnosis not present

## 2024-01-21 DIAGNOSIS — J4530 Mild persistent asthma, uncomplicated: Secondary | ICD-10-CM | POA: Diagnosis not present

## 2024-01-21 DIAGNOSIS — J9611 Chronic respiratory failure with hypoxia: Secondary | ICD-10-CM

## 2024-01-21 DIAGNOSIS — R058 Other specified cough: Secondary | ICD-10-CM

## 2024-01-21 MED ORDER — PREDNISONE 10 MG PO TABS: ORAL_TABLET | ORAL | 0 refills | Status: DC

## 2024-01-21 NOTE — Progress Notes (Signed)
 Virtual Visit via Video Note  I connected with Brenda [name] on 01/21/24 at  3:00 PM EDT by a video enabled telemedicine application and verified that I am speaking with the correct person using two identifiers.  Location: Patient: Home Provider: Office   I discussed the limitations of evaluation and management by telemedicine and the availability of in person appointments. The patient expressed understanding and agreed to proceed.  History of Present Illness: 64 year old female, former smoker.  Past medical history significant for COPD Gold 2, OSA on CPAP, chronic respiratory failure, hypertension, cor pulmonale, CHF, coronary artery disease, type 2 diabetes, chronic kidney disease, morbid obesity.  Patient of Dr. Darlean.  Previous [facility] pulmonary encounter:  01/14/2023 Patient presents today for 4 week follow-up.   -She has upper airway wheeze and productive cough with creamy yellow/color -She was seen on July 12th by Dr. Darlean and treated for COPD exacerbation with course of Augmentin  and prednisone  taper. She saw no improvement after taking medication.  -Using Breztri  Aerosphere two puffs twice daily -She used her nebulizer this morning without much improvement  -Wearing 2L 24/7  -She reports compliance with Protonix  and Pepcid   -She has been taking Mucinex -D  -She has a lot of swelling in legs, she takes lasix  20mg  daily and spirnolactone   -She has gained 5-7 lbs since last visit  -She has CPAP but has not been able to wear it for the last 6 months. Feels like she is suffocating. She has an incline bed.   [employer] download 06/12/2020-09/09/2020  06/12/2020 - 09/09/2020 Usage days 28/90 days (31%) Average usage days used 7 hours 16 minutes Pressure 5 to 15 cm H2O (14.4cm h20-95%) AHI 3.0    02/04/2023 She is doing a lot better since last OV in August, treatment for acute exacerbation. She feels back to baseline. She completed prednisone  course and states that Zyrtec -d really helped her  symptoms. She is in the process of getting a new CPAP machine  OSA on CPAP - Advised patient resume CPAP use once she gets new machine  - FU in 3 months for compliance check   COPD GOLD 2 - Improved - Continue Breztri  Aerosphere 2 puffs q 12 hours (use with spacer) - Continue Albuterol  nebulizer q4-6 hours for breakthrough shortness of breath/wheezing   Chronic respiratory failure with hypoxia (HCC) - Stable; Oxygen  dependent 2L 24/7   Chronic combined systolic and diastolic CHF (congestive heart failure) (HCC) - Continue lasix  20mg  daily and spironolactone  as directed  Upper airway cough syndrome -  Prontonix 40mg  BID and Pepcid  20mg  at bedtime   06/14/2023 Discussed the use of AI scribe software for clinical note transcription with the patient, who gave verbal consent to proceed.  Patient presents today for hospital follow-up. She was admitted in November from 04/03/23-04/12/23 for acute on chronic respiratory failure secondary to COPD exacerbation. Hx diastolic dysfunction and OSA on CPAP.  She required an increased oxygen  supply of four liters, up from her baseline of two liters. During her hospital stay, she was treated with nebulizers and a course of prednisone  and azithromycin . She was discharged once her oxygen  requirements returned to her baseline of two liters and she was deemed stable.  Since her discharge two months ago, the patient reports returning to her baseline health with no significant flare-ups or additional hospitalizations. She has been managing her COPD with a Breztri  inhaler twice daily and has rescue nebulizers and inhalers at home.  Regarding her heart failure, she is on aspirin , a  cholesterol medication Entresto , and a diuretic called spironolactone . She reports that her Lasix  was held during her hospital stay due to borderline blood pressure but was resumed upon discharge.  The patient also has severe sleep apnea, managed with a CPAP machine, which she admits  to using inconsistently. Her last sleep study was in 2016, showing about fifty-eight apneic events per hour. She has lost about fifteen pounds since her last sleep study.  Recently, the patient has noticed a change in her mucus color to a grayish-brown, which she believes is due to sinus drainage. She is currently managing this with Flonase  and Zyrtec  D. She also reports some acid reflux, which is being managed with medication.     07/16/2023- Interim  Discussed the use of AI scribe software for clinical note transcription with the patient, who gave verbal consent to proceed.  History of Present Illness   Brenda [name] is a 63 year old female with COPD who presents for a follow-up visit.  She was last seen in January and had been admitted to the hospital in November for a COPD flare-up. Since the last visit, she has not required hospitalization and feels at her baseline. She uses a Breztri  inhaler twice daily and a rescue inhaler less than once a day. She has not needed her nebulizer recently. Prednisone  was prescribed in January, which helped her symptoms.  She is on continuous oxygen  therapy at two liters per minute with no changes in her oxygen  requirements. She inquires about qualifying for portable oxygen , as she feels restricted by her current setup.  She has a history of sleep apnea and uses her CPAP machine every night since the last visit, which has improved her sleep quality. She confirms that her CPAP machine is functioning well.    Assessment and Plan:  1. COPD mixed type (HCC) (Primary)  2. OSA on CPAP   Chronic Obstructive Pulmonary Disease (COPD) Stable, no recent hospitalizations. Using Breztri  inhaler BID, rescue inhaler less than once daily, and no recent need for nebulizer. On 2 liters of oxygen . -Continue current regimen.  -Check on status of portable oxygen  concentrator order with [employer].  Sleep Apnea Using CPAP consistently and sleeping well. -Continue CPAP  use.  Gastroesophageal Reflux Disease (GERD) On daily reflux medication. -Continue current regimen.     12/15/2023- Acute exacerbation COPD  COPD with acute exacerbation Experiencing an acute exacerbation characterized by increased dyspnea, wheezing, chest tightness, and occasional productive cough with yellow sputum. Symptoms have worsened over the past few months. Wheezing present on lung examination. Exacerbation likely due to bronchitis, as indicated by yellow sputum and congestion. - Administer Depo-medrol  80mg  shot today - Prescribe prednisone  taper to start tomorrow - Prescribe doxycycline  100mg  twice daily x 7 days - Order chest x-ray - Instruct to use Breztri  twice daily - Advise to use nebulizer two to three times daily as needed for wheezing or dyspnea - Recommend Mucinex  to loosen congestion - Advise to go to the emergency room if symptoms worsen   Obstructive sleep apnea Severe OSA. Patient reports compliance with use nightly. Current CPAP machine is outdated, download not available- Due for replacement CPAP machine - Order new CPAP through [facility] auto settings 5-20cm h20    Heart failure On Entresto , spironolactone , and Lasix  for management. Experiences occasional peripheral edema, managed by taking an extra dose of spironolactone  if edema persists for more than a couple of days. Weight has remained stable since January 2025. - Continue Entresto , spironolactone , and Lasix  as  directed   01/21/2024- Interim hx  Discussed the use of AI scribe software for clinical note transcription with the patient, who gave verbal consent to proceed.  History of Present Illness Brenda [name] is a 63 year old female with COPD and severe sleep apnea who presents for follow-up after a recent COPD flare-up.  In July, she experienced a COPD flare-up with increased shortness of breath, wheezing, chest tightness, and an occasional productive cough. Treatment included a Depo-medrol  steroid  injection, a prednisone  taper, and doxycycline , which improved her symptoms. A chest x-ray at that time showed no active pulmonary disease. She returned to her baseline until the past week when she began experiencing sinus issues.  Currently, she has sinus and ear discomfort with clear nasal discharge. She has not been using Zyrtec  D. She uses Flonase  and Astelin  nasal sprays for her symptoms. She occasionally experiences chest tightness and uses her Albuterol  nebulizer as needed. Her oxygen  levels typically run at 95-96%, but she has had to increase her oxygen  to three liters at times due to feeling short of breath.  She has severe sleep apnea and uses her CPAP machine every night. She is awaiting a new CPAP machine as her current one is outdated. She is compliant with her heart failure medications, including Entresto , spironolactone , and Lasix .  Observations/Objective:  Appears well without overt respiratory symptoms; wearing 2L oxygen    Assessment and Plan:  1. Chronic obstructive pulmonary disease, unspecified COPD type (HCC) (Primary)  2. OSA (obstructive sleep apnea)  Assessment and Plan Assessment & Plan Chronic obstructive pulmonary disease (COPD) with recent exacerbation Seen in July for COPD exacerbation with increased shortness of breath, wheezing, chest tightness, and occasional productive cough. Symptoms improved post-treatment with steroid injection, prednisone  taper, and doxycycline . Current symptoms include chest tightness and wheezing due to allergic rhinitis.  - Prescribe prednisone  taper for exacerbation symptoms. - Continue Breztri  inhaler twice daily as directed - Continue albuterol  nebulizer 2-3 times a day as needed for wheezing. - Monitor oxygen  levels at home and maintain oxygen  at 2 liters unless shortness of breath occurs.  Allergic rhinitis with acute sinus symptoms Acute sinus symptoms with clear nasal discharge, likely due to allergic rhinitis. Symptoms include  sinus and ear discomfort. Avoidance of Zyrtec  D due to cardiac history. - Restart regular antihistamine (Claritin , Zyrtec , or Allegra) over the counter. - Use Flonase  nasal spray. - Use Astelin  nasal spray. - Consider chlorphenamine (Chlortabs) 4 mg every 4 hours as needed if symptoms persist, with caution for drowsiness.  Obstructive sleep apnea, severe Severe obstructive sleep apnea managed with CPAP. Current machine is outdated and due for replacement. Awaiting new CPAP machine from [facility]. - Ensure regular use of CPAP every night 4-6 hours - Await contact from [facility] for new CPAP machine within the next week or two. - Consider bringing current machine to [facility] for download if experiencing issues or if pressure adjustments are needed.  Heart failure Heart failure managed with Entresto , spironolactone , and Lasix . Compliance with medication regimen confirmed. - Continue Entresto , spironolactone , and Lasix  as prescribed.    Follow Up Instructions:  3 months or sooner if needed     I discussed the assessment and treatment plan with the patient. The patient was provided an opportunity to ask questions and all were answered. The patient agreed with the plan and demonstrated an understanding of the instructions.   The patient was advised to call back or seek an in-person evaluation if the symptoms worsen or if the condition  fails to improve as anticipated.  I provided 22 minutes of non-face-to-face time during this encounter.   Almarie LELON Ferrari, NP

## 2024-01-21 NOTE — Patient Instructions (Signed)
  VISIT SUMMARY: You had a follow-up appointment to discuss your recent COPD flare-up and other ongoing health issues. We reviewed your symptoms, current treatments, and made some adjustments to your medications and care plan.  YOUR PLAN: -CHRONIC OBSTRUCTIVE PULMONARY DISEASE (COPD) WITH RECENT EXACERBATION: COPD is a chronic lung condition that makes it hard to breathe. You recently had a flare-up with increased shortness of breath, wheezing, and chest tightness. We are prescribing a prednisone  taper to help with these symptoms. Continue using your Breztri  inhaler twice daily and your albuterol  nebulizer 2-3 times a day as needed. Monitor your oxygen  levels at home and keep your oxygen  at 2 liters unless you feel short of breath.  -ALLERGIC RHINITIS WITH ACUTE SINUS SYMPTOMS: Allergic rhinitis is an allergic reaction that causes sneezing, congestion, and sinus discomfort. You are experiencing sinus and ear discomfort with clear nasal discharge. Restart taking a regular antihistamine like Claritin , Zyrtec , or Allegra. Continue using Flonase  and Astelin  nasal sprays. If symptoms persist, you can take chlorphenamine (Chlortabs) 4 mg every 4 hours, but be cautious as it may cause drowsiness.  -OBSTRUCTIVE SLEEP APNEA, SEVERE: Obstructive sleep apnea is a condition where your breathing stops and starts during sleep. You use a CPAP machine every night to help manage this. Your current machine is outdated, and you are waiting for a new one from [facility]. Continue using your CPAP every night and await contact from [facility] for the new machine. If you have issues or need pressure adjustments, consider bringing your current machine to [facility] for download.  -HEART FAILURE: Heart failure is a condition where the heart doesn't pump blood as well as it should. You are managing this with medications including Entresto , spironolactone , and Lasix . Continue taking these medications as prescribed.  INSTRUCTIONS: Please follow  up with us  if your symptoms worsen or if you have any concerns. Await contact from [facility] regarding your new CPAP machine within the next week or two. Continue monitoring your oxygen  levels at home and maintain your current medication regimen. If you experience any issues with your CPAP machine, consider bringing it to [facility] for download.

## 2024-01-21 NOTE — Telephone Encounter (Signed)
 Needs 3 month follow-up with Beth NP in person if able for CPAP compliance and COPD, otherwise virtual would be ok

## 2024-01-23 ENCOUNTER — Ambulatory Visit: Payer: Self-pay | Admitting: Internal Medicine

## 2024-01-24 NOTE — Progress Notes (Signed)
 Remote Loop Recorder Transmission

## 2024-01-24 NOTE — Addendum Note (Signed)
 Addended by: VICCI SELLER A on: 01/24/2024 10:48 AM   Modules accepted: Orders

## 2024-01-27 ENCOUNTER — Other Ambulatory Visit: Payer: Self-pay | Admitting: Family Medicine

## 2024-01-27 DIAGNOSIS — G894 Chronic pain syndrome: Secondary | ICD-10-CM

## 2024-01-27 DIAGNOSIS — G8929 Other chronic pain: Secondary | ICD-10-CM

## 2024-01-28 NOTE — Telephone Encounter (Signed)
 Name of Medication:  Lorazepam , Tramadol  Name of Pharmacy:  [facility] Last Fill or Written Date and Quantity:  01/07/24, #20 Last Office Visit and Type:  10/29/23, CPE Next Office Visit and Type:  05/01/24, 6 mo DM f/u Last Controlled Substance Agreement Date:  none Last UDS:  none

## 2024-01-28 NOTE — Telephone Encounter (Signed)
 ERx

## 2024-01-29 ENCOUNTER — Other Ambulatory Visit: Payer: Self-pay | Admitting: Family Medicine

## 2024-01-29 DIAGNOSIS — J449 Chronic obstructive pulmonary disease, unspecified: Secondary | ICD-10-CM

## 2024-01-31 ENCOUNTER — Other Ambulatory Visit: Payer: Self-pay | Admitting: Cardiovascular Disease

## 2024-02-01 NOTE — Telephone Encounter (Signed)
 Albuterol  neb soln Last filled:  12/28/23, #150 mL Last OV:  10/29/23, CPE Next OV:  05/01/24,  6 mo DM f/u

## 2024-02-03 ENCOUNTER — Other Ambulatory Visit: Payer: Self-pay | Admitting: Family Medicine

## 2024-02-03 DIAGNOSIS — G4733 Obstructive sleep apnea (adult) (pediatric): Secondary | ICD-10-CM | POA: Diagnosis not present

## 2024-02-03 DIAGNOSIS — F319 Bipolar disorder, unspecified: Secondary | ICD-10-CM

## 2024-02-03 DIAGNOSIS — J449 Chronic obstructive pulmonary disease, unspecified: Secondary | ICD-10-CM | POA: Diagnosis not present

## 2024-02-03 NOTE — Telephone Encounter (Signed)
 ERx

## 2024-02-15 DIAGNOSIS — G4733 Obstructive sleep apnea (adult) (pediatric): Secondary | ICD-10-CM | POA: Diagnosis not present

## 2024-02-17 ENCOUNTER — Other Ambulatory Visit: Payer: Self-pay | Admitting: Primary Care

## 2024-02-20 ENCOUNTER — Ambulatory Visit

## 2024-02-20 DIAGNOSIS — G459 Transient cerebral ischemic attack, unspecified: Secondary | ICD-10-CM | POA: Diagnosis not present

## 2024-02-21 ENCOUNTER — Encounter

## 2024-02-21 DIAGNOSIS — J4530 Mild persistent asthma, uncomplicated: Secondary | ICD-10-CM | POA: Diagnosis not present

## 2024-02-22 NOTE — Progress Notes (Signed)
 Remote Loop Recorder Transmission

## 2024-02-25 ENCOUNTER — Other Ambulatory Visit: Payer: Self-pay | Admitting: Family Medicine

## 2024-02-25 DIAGNOSIS — J449 Chronic obstructive pulmonary disease, unspecified: Secondary | ICD-10-CM

## 2024-02-25 NOTE — Telephone Encounter (Signed)
 Called patient verified that she did request refill on albuterol . States that she takes every day bid. More if increased symptoms.

## 2024-02-28 NOTE — Telephone Encounter (Signed)
 ERx albuterol  inhaler. That's a lot of albuterol  rescue inhaler ise. Is she regularly using her Breztri  inhaler 2 puffs BID? Needs to be taking controller inhaler regularly as well.

## 2024-02-29 ENCOUNTER — Ambulatory Visit: Payer: Self-pay | Admitting: Internal Medicine

## 2024-02-29 LAB — CUP PACEART REMOTE DEVICE CHECK
Date Time Interrogation Session: 20250921230823
Implantable Pulse Generator Implant Date: 20211102

## 2024-03-01 NOTE — Progress Notes (Signed)
 Remote Loop Recorder Transmission

## 2024-03-01 NOTE — Telephone Encounter (Signed)
 Spoke with pt notifying her inhaler refill sent in and asking about the albuterol  use. Pt states she is using Breztri  as prescribed but has also needed albuterol  almost every morning and occasionally at bedtime here recently.

## 2024-03-04 ENCOUNTER — Other Ambulatory Visit: Payer: Self-pay | Admitting: Family Medicine

## 2024-03-04 DIAGNOSIS — G894 Chronic pain syndrome: Secondary | ICD-10-CM

## 2024-03-04 DIAGNOSIS — G8929 Other chronic pain: Secondary | ICD-10-CM

## 2024-03-05 ENCOUNTER — Other Ambulatory Visit: Payer: Self-pay | Admitting: Family Medicine

## 2024-03-05 DIAGNOSIS — F319 Bipolar disorder, unspecified: Secondary | ICD-10-CM

## 2024-03-06 NOTE — Progress Notes (Signed)
 Remote Loop Recorder Transmission

## 2024-03-06 NOTE — Telephone Encounter (Signed)
 Name of Medication:  Lorazepam , Tramadol  Name of Pharmacy:  [facility] Last Fill or Written Date and Quantity:  02/04/24, #30 Last Office Visit and Type:  10/29/23, CPE Next Office Visit and Type:  05/01/24, 6 mo DM f/u Last Controlled Substance Agreement Date:  none Last UDS:  none

## 2024-03-06 NOTE — Telephone Encounter (Signed)
 Name of Medication:  Tramadol  Name of Pharmacy:  [facility] Last Fill or Written Date and Quantity:  01/28/24, #20 Last Office Visit and Type:  10/29/23, CPE Next Office Visit and Type:  05/01/24, 6 mo DM f/u Last Controlled Substance Agreement Date:  none Last UDS:  none

## 2024-03-06 NOTE — Telephone Encounter (Deleted)
 Name of Medication:  Lorazepam , Tramadol  Name of Pharmacy:  [facility] [address] Last Fill or Written Date and Quantity:  01/28/24, #20 Last Office Visit and Type:  10/29/23, CPE Next Office Visit and Type:  05/01/24, 6 mo DM f/u Last Controlled Substance Agreement Date:  none Last UDS:  none

## 2024-03-08 NOTE — Telephone Encounter (Signed)
 ERx

## 2024-03-10 ENCOUNTER — Other Ambulatory Visit: Payer: Self-pay | Admitting: Cardiovascular Disease

## 2024-03-10 MED ORDER — ENTRESTO 24-26 MG PO TABS: 1.0000 | ORAL_TABLET | Freq: Two times a day (BID) | ORAL | 0 refills | Status: DC

## 2024-03-10 MED ORDER — METOPROLOL SUCCINATE ER 25 MG PO TB24: 25.0000 mg | ORAL_TABLET | Freq: Every day | ORAL | 0 refills | Status: DC

## 2024-03-10 MED ORDER — EMPAGLIFLOZIN 10 MG PO TABS: 10.0000 mg | ORAL_TABLET | Freq: Every day | ORAL | 0 refills | Status: DC

## 2024-03-13 ENCOUNTER — Ambulatory Visit: Payer: Self-pay

## 2024-03-13 ENCOUNTER — Encounter: Payer: Self-pay | Admitting: Family Medicine

## 2024-03-13 ENCOUNTER — Ambulatory Visit (INDEPENDENT_AMBULATORY_CARE_PROVIDER_SITE_OTHER): Admitting: Family Medicine

## 2024-03-13 VITALS — BP 126/82 | HR 75 | Temp 98.2°F | Ht 59.5 in | Wt 274.0 lb

## 2024-03-13 DIAGNOSIS — Z23 Encounter for immunization: Secondary | ICD-10-CM | POA: Diagnosis not present

## 2024-03-13 DIAGNOSIS — N3001 Acute cystitis with hematuria: Secondary | ICD-10-CM

## 2024-03-13 DIAGNOSIS — N390 Urinary tract infection, site not specified: Secondary | ICD-10-CM | POA: Insufficient documentation

## 2024-03-13 DIAGNOSIS — R319 Hematuria, unspecified: Secondary | ICD-10-CM

## 2024-03-13 DIAGNOSIS — R8290 Unspecified abnormal findings in urine: Secondary | ICD-10-CM | POA: Diagnosis not present

## 2024-03-13 LAB — POC URINALSYSI DIPSTICK (AUTOMATED)
Bilirubin, UA: NEGATIVE
Blood, UA: 50 — AB
Glucose, UA: POSITIVE — AB
Ketones, UA: NEGATIVE
Nitrite, UA: POSITIVE — AB
Protein, UA: NEGATIVE
Spec Grav, UA: 1.01 (ref 1.010–1.025)
Urobilinogen, UA: 0.2 U/dL
pH, UA: 5.5 (ref 5.0–8.0)

## 2024-03-13 MED ORDER — CEPHALEXIN 500 MG PO CAPS: 500.0000 mg | ORAL_CAPSULE | Freq: Two times a day (BID) | ORAL | 0 refills | Status: DC

## 2024-03-13 NOTE — Telephone Encounter (Signed)
 FYI Only or Action Required?: FYI only for provider.  Patient was last seen in primary care on 10/29/2023 by Rilla Baller, MD.  Called Nurse Triage reporting Dysuria and Hematuria.  Symptoms began a week ago.  Interventions attempted: Rest, hydration, or home remedies.  Symptoms are: left flank pain, pain/pressure when urinating, urinary frequency, hematuria.  Triage Disposition: See HCP Within 4 Hours (Or PCP Triage)  Patient/caregiver understands and will follow disposition?: Yes              Copied from CRM [id number]. Topic: Clinical - Red Word Triage >> Mar 13, 2024  7:54 AM Mia F wrote: Red Word that prompted transfer to Nurse Triage: UTI with blood in urine, pressure and pain when urinating. Been going for about a week. Reason for Disposition  Side (flank) or lower back pain present  Answer Assessment - Initial Assessment Questions 1. SEVERITY: How bad is the pain?  (e.g., Scale 1-10; mild, moderate, or severe)     7/10. Pain/pressure.  2. FREQUENCY: How many times have you had painful urination today?      Once.  3. PATTERN: Is pain present every time you urinate or just sometimes?      Almost every time.  4. ONSET: When did the painful urination start?      X 1 week.  5. FEVER: Do you have a fever? If Yes, ask: What is your temperature, how was it measured, and when did it start?     No.  6. PAST UTI: Have you had a urine infection before? If Yes, ask: When was the last time? and What happened that time?      Yes, been over a year. Comes into the office and is treated.  7. CAUSE: What do you think is causing the painful urination?  (e.g., UTI, scratch, Herpes sore)     UTI.  8. OTHER SYMPTOMS: Do you have any other symptoms? (e.g., blood in urine, flank pain, genital sores, urgency, vaginal discharge)     Little more more frequently, bright red blood in urine (mixed in with urine and when wiping on the toilet tissue; fatigue;  left flank pain. Denies nausea, vomiting, fever. 9. PREGNANCY: Is there any chance you are pregnant? When was your last menstrual period?     N/A.  Protocols used: Urination Pain - Female-A-AH

## 2024-03-13 NOTE — Telephone Encounter (Signed)
 Will see patient then Agree with ER and UC precautions

## 2024-03-13 NOTE — Assessment & Plan Note (Signed)
 Voiding symptoms Has had gross blood in urine at home Mild left flank discomfort (reassuring exam) In diabetic pt taking jardiance /also ckd   Encouraged fluids Keflex 500 mg bid  Culture if we get enough sample  Monitor closely  Instructed to call if symptoms worsen Handout given   Call back and Er precautions noted in detail today

## 2024-03-13 NOTE — Progress Notes (Signed)
 Subjective:    Patient ID: Brenda [name], female    DOB: 1960-12-01, 63 y.o.   MRN: [id number]  HPI  Wt Readings from Last 3 Encounters:  03/13/24 274 lb (124.3 kg)  12/15/23 277 lb 6.4 oz (125.8 kg)  10/29/23 277 lb 8 oz (125.9 kg)   54.42 kg/m  Vitals:   03/13/24 1452  BP: 126/82  Pulse: 75  Temp: 98.2 F (36.8 C)  SpO2: 95%   63 yo pt of Dr KANDICE presents for urinary symptoms   PMH notable for DM 2 and HTN  with CKD 3a and mixed urinary incontinence  Pyelonephritis in 2013  She does take jardiance  for DM  Urinary symptoms  About a week  Pressure  Burning  Some blood in urine yesterday Left flank is uncomfortable  No fever  Mild nausea but no vomiting Is drinking water   Results for orders placed or performed in visit on 03/13/24  POCT Urinalysis Dipstick (Automated)   Collection Time: 03/13/24  3:11 PM  Result Value Ref Range   Color, UA Yellow    Clarity, UA Hazy    Glucose, UA Positive (A) Negative   Bilirubin, UA Negative    Ketones, UA Negative    Spec Grav, UA 1.010 1.010 - 1.025   Blood, UA 50 Ery/uL (A)    pH, UA 5.5 5.0 - 8.0   Protein, UA Negative Negative   Urobilinogen, UA 0.2 0.2 or 1.0 E.U./dL   Nitrite, UA Positive (A)    Leukocytes, UA Moderate (2+) (A) Negative      Lab Results  Component Value Date   NA 139 10/22/2023   K 4.5 10/22/2023   CO2 30 10/22/2023   GLUCOSE 137 (H) 10/22/2023   BUN 18 10/22/2023   CREATININE 1.10 10/22/2023   CALCIUM  9.5 10/22/2023   GFR 52.82 (L) 10/22/2023   GFRNONAA >60 04/12/2023      Patient Active Problem List   Diagnosis Date Noted   UTI (urinary tract infection) 03/13/2024   Low serum vitamin B12 10/29/2023   Mixed urge and stress incontinence 04/27/2023   Hyperlipidemia 02/25/2023   History of CVA (cerebrovascular accident) 02/25/2023   Peripheral neuropathy 02/25/2023   GERD (gastroesophageal reflux disease) 02/25/2023   Sinus congestion 10/27/2022   DOE (dyspnea on exertion)  10/14/2022   Chronic low back pain 04/11/2022   Aortic atherosclerosis 04/11/2022   NAFLD (nonalcoholic fatty liver disease) 88/88/7976   Left sided sciatica 02/02/2022   Vitamin D  deficiency 10/08/2021   CAD (coronary artery disease)    History of ST elevation myocardial infarction (STEMI)    Medicare annual wellness visit, subsequent 10/01/2020   Cannabis dependence (HCC) 04/30/2020   Basilar artery stenosis 02/18/2020   TIA (transient ischemic attack) 02/08/2020   Chronic heart failure with preserved ejection fraction (HFpEF, >= 50%) (HCC) 02/08/2020   Elevated troponin 02/04/2020   Chronic kidney disease, stage 3a (HCC) 08/07/2019   Knee mass, right 11/28/2018   Advanced care planning/counseling discussion 07/27/2018   Unilateral primary osteoarthritis, left knee 10/28/2017   COPD (chronic obstructive pulmonary disease) (HCC) 10/20/2017   Cor pulmonale (chronic) (HCC) 09/16/2017   Diastolic dysfunction 08/26/2017   Grief 02/24/2017   Chronic hypoxic respiratory failure (HCC) 07/31/2015   Type 2 diabetes mellitus with other specified complication (HCC)    OSA on CPAP 09/14/2014   Health maintenance examination 09/14/2014   Upper airway cough syndrome 09/22/2013   Chronic pain syndrome 05/30/2013   Leukocytosis 02/22/2008   NONSPECIFIC  ABNORMAL ELECTROCARDIOGRAM 02/22/2008   Essential hypertension 06/23/2007   HYPERTRIGLYCERIDEMIA 06/01/2007   Morbid obesity with BMI of 50.0-59.9, adult (HCC) 06/01/2007   Bipolar disorder (HCC) 06/01/2007   Ex-smoker 06/01/2007   Past Medical History:  Diagnosis Date   Anxiety    Arthritis    Asthma    main dyspnea thought due to deconditioning (10/2013)   Bipolar I disorder, most recent episode (or current) manic, unspecified    pt denies this   Bronchitis, chronic obstructive (HCC) 2008 & 2009   FeV1 64% TLC 105% DLCO 55% 2008  -  FeV1 81% FeF 25-75 43% 2009   CAD (coronary artery disease)    inferior STEMI in 10/2020 s/p DES to RCA    Chronic combined systolic and diastolic CHF (congestive heart failure) (HCC)    LVEF 30-35% in 01/2020 but improved to 60-65% in 05/2021   COPD (chronic obstructive pulmonary disease) (HCC)    emphysema, not an issue per pulm (10/2013)   Depression    History of acute pancreatitis 1990   History of pyelonephritis 05/2012   History of ST elevation myocardial infarction (STEMI)    HTN (hypertension)    Hx of migraines    Hyperlipidemia    Knee pain, right    Lumbar disc disease with radiculopathy    multilevel spondylitic changes, scoliosis, and anterolisthesis L4 not thought to currently be surg candidate (Dr. Amon, [facility]) (Dr. Mindi, [place])   Nicotine  addiction    Obesity    Oxygen  deficiency    RSV (respiratory syncytial virus pneumonia) 01/2020   hospitalization   Sleep apnea    Stroke (HCC)    Suicide attempt (HCC) 06/2001   Swelling of limb    TIA (transient ischemic attack)    Type 2 diabetes mellitus (HCC)    Urge and stress incontinence 07/2012   ([facility])   Past Surgical History:  Procedure Laterality Date   BUBBLE STUDY  04/02/2020   Procedure: BUBBLE STUDY;  Surgeon: Alveta Aleene PARAS, MD;  Location: [facility] ENDOSCOPY;  Service: Cardiovascular;;   CHOLECYSTECTOMY  07/02/1982   CORONARY/GRAFT ACUTE MI REVASCULARIZATION N/A 11/02/2020   Procedure: Coronary/Graft Acute MI Revascularization;  Surgeon: Mady Bruckner, MD;  Location: [facility] INVASIVE CV LAB;  Service: Cardiovascular;  Laterality: N/A;   CT MAXILLOFACIAL WO/W CM  06/14/2006   Left max mucocele   CT of chest  06/14/2006   Normal except c/w active inflammation / infection.  Left renal atrophy   ERCP / sphincterotomy - stenosis  01/15/2006   IR ANGIO INTRA EXTRACRAN SEL COM CAROTID INNOMINATE BILAT MOD SED  02/09/2020   IR ANGIO VERTEBRAL SEL SUBCLAVIAN INNOMINATE UNI L MOD SED  02/09/2020   IR ANGIO VERTEBRAL SEL VERTEBRAL UNI R MOD SED  02/09/2020   IR ANGIO VERTEBRAL SEL VERTEBRAL UNI R MOD SED   02/13/2020   IR US  GUIDE VASC ACCESS RIGHT  02/09/2020   IR US  GUIDE VASC ACCESS RIGHT  02/13/2020   JOINT REPLACEMENT     Right knee   LEFT HEART CATH AND CORONARY ANGIOGRAPHY N/A 11/02/2020   Procedure: LEFT HEART CATH AND CORONARY ANGIOGRAPHY;  Surgeon: Mady Bruckner, MD;  Location: [facility] INVASIVE CV LAB;  Service: Cardiovascular;  Laterality: N/A;   LOOP RECORDER INSERTION N/A 04/02/2020   Procedure: LOOP RECORDER INSERTION;  Surgeon: Waddell Danelle ORN, MD;  Location: [facility] INVASIVE CV LAB;  Service: Cardiovascular;  Laterality: N/A;   lumbar MRI  11/30/2010   multilevel spondylitic changes with upper lumbar scoliosis and anterolisthesis  of L4 on 5 with some L foraminal narrowing esp at L/3 with concavity of scoliosis, some central stenosis and biforaminal narrowing at L4/5 with spondylolisthesis   [place] - Pneumonia, acute asthma, left maxillary sinusitis  01/11 - 06/18/2006   RADIOLOGY WITH ANESTHESIA N/A 02/13/2020   Procedure: Carotid Stent;  Surgeon: Dolphus Carrion, MD;  Location: [facility] OR;  Service: Radiology;  Laterality: N/A;   Retained stone     ERCP secondary to retained stone   Right knee surgery  1984,1989,1989,1991,1994   Reconstructions for ACL insuff   TEE WITHOUT CARDIOVERSION N/A 04/02/2020   Procedure: TRANSESOPHAGEAL ECHOCARDIOGRAM (TEE);  Surgeon: Alveta Aleene PARAS, MD;  Location: [facility] ENDOSCOPY;  Service: Cardiovascular;  Laterality: N/A;   TUBAL LIGATION     Social History   Tobacco Use   Smoking status: Former    Current packs/day: 0.00    Average packs/day: 0.3 packs/day for 41.0 years (10.3 ttl pk-yrs)    Types: Cigarettes    Start date: 10/01/1980    Quit date: 10/01/2021    Years since quitting: 2.4    Passive exposure: Current   Smokeless tobacco: Never  Vaping Use   Vaping status: Never Used  Substance Use Topics   Alcohol use: Yes    Alcohol/week: 0.0 standard drinks of alcohol    Comment: occasional   Drug use: Not Currently    Types: Marijuana    Family History  Problem Relation Age of Onset   Hypertension Mother    Hyperlipidemia Mother    Heart disease Mother    Arthritis Mother    Diabetes Mother    Hypertension Father    Asthma Father    Arthritis Father    Breast cancer Paternal Aunt 31 - 8   Breast cancer Paternal Grandmother 62 - 25   Heart disease Brother        MI in early 34's   Allergies  Allergen Reactions   Metolazone Other (See Comments)    Metabolic mood swings   Current Outpatient Medications on File Prior to Visit  Medication Sig Dispense Refill   albuterol  (PROVENTIL ) (2.5 MG/3ML) 0.083% nebulizer solution INHALE 1 VIAL VIA NEBULIZER EVERY 6 HOURS AS NEEDED FOR WHEEZING/SHORTNESS OF BREATH 150 mL 1   albuterol  (VENTOLIN  HFA) 108 (90 Base) MCG/ACT inhaler TAKE 2 PUFFS BY MOUTH EVERY 6 HOURS AS NEEDED FOR WHEEZE OR SHORTNESS OF BREATH 8.5 each 3   ARIPiprazole  (ABILIFY ) 10 MG tablet Take 1 tablet (10 mg total) by mouth daily. 90 tablet 4   aspirin  EC 81 MG EC tablet Take 1 tablet (81 mg total) by mouth daily. Swallow whole. 30 tablet 0   atorvastatin  (LIPITOR) 80 MG tablet TAKE 1 TABLET BY MOUTH EVERYDAY AT BEDTIME For cholesterol 90 tablet 4   Azelastine  HCl 137 MCG/SPRAY SOLN PLACE 2 SPRAYS INTO BOTH NOSTRILS 2 (TWO) TIMES DAILY. USE IN EACH NOSTRIL AS DIRECTED 90 mL 1   BREZTRI  AEROSPHERE 160-9-4.8 MCG/ACT AERO inhaler INHALE 2 PUFFS INTO LUNGS 2 TIMES DAILY 10.7 each 11   cetirizine -pseudoephedrine (ZYRTEC -D) 5-120 MG tablet Take 1 tablet by mouth every evening.     Cholecalciferol  (VITAMIN D3) 25 MCG (1000 UT) CAPS Take 1 capsule (1,000 Units total) by mouth daily. 30 capsule    cyanocobalamin  (VITAMIN B12) 1000 MCG tablet Take 1 tablet (1,000 mcg total) by mouth daily.     dextromethorphan -guaiFENesin  (MUCINEX  DM) 30-600 MG 12hr tablet Take 1 tablet by mouth 2 (two) times daily as needed for cough.  Dulaglutide  (TRULICITY ) 1.5 MG/0.5ML SOAJ Inject 1.5 mg into the skin once a week. 6 mL 3    empagliflozin  (JARDIANCE ) 10 MG TABS tablet Take 1 tablet (10 mg total) by mouth daily before breakfast. 15 tablet 0   ENTRESTO  24-26 MG Take 1 tablet by mouth 2 (two) times daily. 30 tablet 0   escitalopram  (LEXAPRO ) 20 MG tablet Take 1 tablet (20 mg total) by mouth daily. 90 tablet 4   famotidine  (PEPCID ) 20 MG tablet TAKE 1 TABLET EVERY DAY AFTER SUPPER 90 tablet 3   fluticasone  (FLONASE ) 50 MCG/ACT nasal spray Place 2 sprays into both nostrils daily. 48 mL 3   furosemide  (LASIX ) 20 MG tablet Take 1 tablet (20 mg total) by mouth daily. 90 tablet 4   gabapentin  (NEURONTIN ) 300 MG capsule Take 2 capsules (600 mg total) by mouth in the morning AND 3 capsules (900 mg total) at bedtime. 450 capsule 4   LORazepam  (ATIVAN ) 0.5 MG tablet TAKE 0.5-1 TABLET (0.25-0.5 MG TOTAL) BY MOUTH AT BEDTIME AS NEEDED FOR ANXIETY. 30 tablet 0   metoprolol  succinate (TOPROL -XL) 25 MG 24 hr tablet Take 1 tablet (25 mg total) by mouth daily. 15 tablet 0   Multiple Vitamin (MULTIVITAMIN WITH MINERALS) TABS tablet Take 1 tablet by mouth daily.     nitroGLYCERIN  (NITROSTAT ) 0.4 MG SL tablet Place 1 tablet (0.4 mg total) under the tongue every 5 (five) minutes as needed. 25 tablet 2   OXYGEN  Inhale 2 L/min into the lungs continuous.     pantoprazole  (PROTONIX ) 40 MG tablet TAKE 1 TABLET BY MOUTH 2 TIMES DAILY 30-60 MIN BEFORE FIRST MEAL OF THE DAY 180 tablet 2   spironolactone  (ALDACTONE ) 25 MG tablet TAKE HALF TABLET BY MOUTH DAILY 7 tablet 0   traMADol  (ULTRAM ) 50 MG tablet TAKE 1 TABLET BY MOUTH EVERY 12 HOURS AS NEEDED 20 tablet 0   No current facility-administered medications on file prior to visit.    Review of Systems  Constitutional:  Positive for fatigue. Negative for activity change, appetite change and fever.  HENT:  Negative for congestion and sore throat.   Eyes:  Negative for itching and visual disturbance.  Respiratory:  Negative for cough and shortness of breath.   Cardiovascular:  Negative for leg  swelling.  Gastrointestinal:  Positive for nausea. Negative for abdominal distention, abdominal pain, constipation, diarrhea and vomiting.       Mild nausea   Endocrine: Negative for cold intolerance and polydipsia.  Genitourinary:  Positive for dysuria, frequency, hematuria and urgency. Negative for difficulty urinating and flank pain.  Musculoskeletal:  Negative for myalgias.  Skin:  Negative for rash.  Allergic/Immunologic: Negative for immunocompromised state.  Neurological:  Negative for dizziness and weakness.  Hematological:  Negative for adenopathy.       Objective:   Physical Exam Constitutional:      General: She is not in acute distress.    Appearance: Normal appearance. She is well-developed. She is obese. She is not ill-appearing or diaphoretic.  HENT:     Head: Normocephalic and atraumatic.     Comments: Wearing 02 by [place]  Eyes:     Conjunctiva/sclera: Conjunctivae normal.     Pupils: Pupils are equal, round, and reactive to light.  Cardiovascular:     Rate and Rhythm: Normal rate and regular rhythm.     Heart sounds: Normal heart sounds.  Pulmonary:     Effort: Pulmonary effort is normal. No respiratory distress.     Breath sounds: Normal  breath sounds. No wheezing.  Abdominal:     General: Bowel sounds are normal. There is no distension.     Palpations: Abdomen is soft.     Tenderness: There is abdominal tenderness. There is no rebound.     Comments: Mild left CVA tenderness to deep palpation    Mild suprapubic tenderness without fullness   Musculoskeletal:     Cervical back: Normal range of motion and neck supple.  Lymphadenopathy:     Cervical: No cervical adenopathy.  Skin:    Findings: No rash.  Neurological:     Mental Status: She is alert.  Psychiatric:        Mood and Affect: Mood normal.           Assessment & Plan:   Problem List Items Addressed This Visit       Genitourinary   UTI (urinary tract infection) - Primary   Voiding  symptoms Has had gross blood in urine at home Mild left flank discomfort (reassuring exam) In diabetic pt taking jardiance /also ckd   Encouraged fluids Keflex 500 mg bid  Culture if we get enough sample  Monitor closely  Instructed to call if symptoms worsen Handout given   Call back and Er precautions noted in detail today          Relevant Medications   cephALEXin (KEFLEX) 500 MG capsule   Other Visit Diagnoses       Hematuria, unspecified type       Relevant Orders   POCT Urinalysis Dipstick (Automated) (Completed)

## 2024-03-13 NOTE — Telephone Encounter (Signed)
Noted. Appreciate Dr. Tower seeing patient.  

## 2024-03-13 NOTE — Patient Instructions (Addendum)
 Keep drinking water   If we can get another sample we will send it for culture  Then reach out when that returns   If symptoms worsen in the meantime please call and let us  know

## 2024-03-15 ENCOUNTER — Ambulatory Visit: Payer: Self-pay | Admitting: Family Medicine

## 2024-03-16 ENCOUNTER — Encounter

## 2024-03-16 DIAGNOSIS — J449 Chronic obstructive pulmonary disease, unspecified: Secondary | ICD-10-CM | POA: Diagnosis not present

## 2024-03-16 DIAGNOSIS — G4733 Obstructive sleep apnea (adult) (pediatric): Secondary | ICD-10-CM | POA: Diagnosis not present

## 2024-03-18 LAB — URINE CULTURE
MICRO NUMBER:: 17091004
SPECIMEN QUALITY:: ADEQUATE

## 2024-03-22 ENCOUNTER — Ambulatory Visit

## 2024-03-22 DIAGNOSIS — J4530 Mild persistent asthma, uncomplicated: Secondary | ICD-10-CM | POA: Diagnosis not present

## 2024-03-22 DIAGNOSIS — J449 Chronic obstructive pulmonary disease, unspecified: Secondary | ICD-10-CM | POA: Diagnosis not present

## 2024-03-22 DIAGNOSIS — G459 Transient cerebral ischemic attack, unspecified: Secondary | ICD-10-CM

## 2024-03-23 ENCOUNTER — Ambulatory Visit: Payer: Self-pay | Admitting: Internal Medicine

## 2024-03-23 ENCOUNTER — Encounter

## 2024-03-23 LAB — CUP PACEART REMOTE DEVICE CHECK
Date Time Interrogation Session: 20251021230955
Implantable Pulse Generator Implant Date: 20211102

## 2024-03-24 NOTE — Progress Notes (Signed)
 Remote Loop Recorder Transmission

## 2024-04-01 ENCOUNTER — Other Ambulatory Visit: Payer: Self-pay | Admitting: Family Medicine

## 2024-04-01 DIAGNOSIS — J449 Chronic obstructive pulmonary disease, unspecified: Secondary | ICD-10-CM

## 2024-04-01 DIAGNOSIS — G4733 Obstructive sleep apnea (adult) (pediatric): Secondary | ICD-10-CM | POA: Diagnosis not present

## 2024-04-11 ENCOUNTER — Other Ambulatory Visit: Payer: Self-pay | Admitting: Family Medicine

## 2024-04-11 ENCOUNTER — Other Ambulatory Visit: Payer: Self-pay | Admitting: Cardiovascular Disease

## 2024-04-11 DIAGNOSIS — G8929 Other chronic pain: Secondary | ICD-10-CM

## 2024-04-11 DIAGNOSIS — G894 Chronic pain syndrome: Secondary | ICD-10-CM

## 2024-04-11 DIAGNOSIS — F319 Bipolar disorder, unspecified: Secondary | ICD-10-CM

## 2024-04-12 NOTE — Telephone Encounter (Signed)
 Last OV: 10/29/23 Pending OV: 05/01/24 Medication: Tramadol  50mg  Directions: Take one tablet q12h prn Last Refill: 03/08/24 Qty: #20 with 0 refills   Medication: Lorazepam  0.5mg  Directions: Take 0.5-1 tablet at bedtime prn  Last Refill: 03/08/24 Qty: #30 with 0 refills

## 2024-04-13 ENCOUNTER — Other Ambulatory Visit: Payer: Self-pay | Admitting: Cardiovascular Disease

## 2024-04-13 NOTE — Telephone Encounter (Signed)
 ERx

## 2024-04-17 ENCOUNTER — Encounter

## 2024-04-17 ENCOUNTER — Other Ambulatory Visit: Payer: Self-pay

## 2024-04-17 ENCOUNTER — Other Ambulatory Visit (HOSPITAL_COMMUNITY): Payer: Self-pay

## 2024-04-17 MED ORDER — ENTRESTO 24-26 MG PO TABS
1.0000 | ORAL_TABLET | Freq: Two times a day (BID) | ORAL | 0 refills | Status: DC
  Filled 2024-04-17: qty 180, 90d supply, fill #0

## 2024-04-22 ENCOUNTER — Ambulatory Visit (INDEPENDENT_AMBULATORY_CARE_PROVIDER_SITE_OTHER)

## 2024-04-22 DIAGNOSIS — G459 Transient cerebral ischemic attack, unspecified: Secondary | ICD-10-CM

## 2024-04-24 ENCOUNTER — Encounter

## 2024-04-24 ENCOUNTER — Encounter: Payer: Self-pay | Admitting: Primary Care

## 2024-04-24 ENCOUNTER — Telehealth: Admitting: Primary Care

## 2024-04-24 VITALS — HR 67 | Ht 60.0 in | Wt 267.0 lb

## 2024-04-24 DIAGNOSIS — J9611 Chronic respiratory failure with hypoxia: Secondary | ICD-10-CM | POA: Diagnosis not present

## 2024-04-24 DIAGNOSIS — J309 Allergic rhinitis, unspecified: Secondary | ICD-10-CM | POA: Diagnosis not present

## 2024-04-24 DIAGNOSIS — J449 Chronic obstructive pulmonary disease, unspecified: Secondary | ICD-10-CM | POA: Diagnosis not present

## 2024-04-24 DIAGNOSIS — G4733 Obstructive sleep apnea (adult) (pediatric): Secondary | ICD-10-CM | POA: Diagnosis not present

## 2024-04-24 LAB — CUP PACEART REMOTE DEVICE CHECK
Date Time Interrogation Session: 20251121231144
Implantable Pulse Generator Implant Date: 20211102

## 2024-04-24 NOTE — Patient Instructions (Signed)
  VISIT SUMMARY: During your follow-up visit, we reviewed your current health status and management of your chronic conditions, including COPD, respiratory failure, sleep apnea, allergic rhinitis, and heart failure. You have been stable since your last visit, with no recent hospitalizations or exacerbations. Your current treatments are effective, and you are adhering well to your prescribed therapies.  YOUR PLAN: -CHRONIC OBSTRUCTIVE PULMONARY DISEASE (COPD): COPD is a chronic lung condition that makes it hard to breathe. Your COPD is well-managed with no recent hospitalizations or exacerbations. Continue using Breztri  Aerosphere, two puffs twice a day, and albuterol  as needed.  -CHRONIC RESPIRATORY FAILURE WITH HYPOXIA: Chronic respiratory failure with hypoxia means your lungs cannot get enough oxygen  into your blood. You are managing this with continuous oxygen  therapy. Continue using two liters of oxygen  24/7.  -OBSTRUCTIVE SLEEP APNEA: Obstructive sleep apnea is a condition where your breathing stops and starts during sleep. Your condition is well-controlled with CPAP therapy. Continue using your CPAP machine with the current settings.  -ALLERGIC RHINITIS: Allergic rhinitis is an allergic reaction that causes sneezing, runny nose, and other similar symptoms. Continue using Flonase  and [place] nasal sprays, and Zyrtec  as needed. You can also consider using Chlortabs for additional symptom control if necessary.  -HEART FAILURE: Heart failure means your heart does not pump blood as well as it should. Your heart failure is well-managed with your current medications. Continue taking Entresto , spironolactone , and Lasix  daily.  INSTRUCTIONS: Please continue with your current medications and therapies as discussed. If you experience any new symptoms or changes in your condition, contact our office. Schedule your next follow-up appointment in three months.                      Contains  text generated by [employer].                                 Contains text generated by [employer]bridge.

## 2024-04-24 NOTE — Progress Notes (Signed)
 Virtual Visit via Video Note  I connected with Brenda [name] on 04/24/24 at 10:00 AM EST by a video enabled telemedicine application and verified that I am speaking with the correct person using two identifiers.  Location: Patient: Home Provider: Office    I discussed the limitations of evaluation and management by telemedicine and the availability of in person appointments. The patient expressed understanding and agreed to proceed.  History of Present Illness: 63 year old female, former smoker.  Past medical history significant for COPD Gold 2, OSA on CPAP, chronic respiratory failure, hypertension, cor pulmonale, CHF, coronary artery disease, type 2 diabetes, chronic kidney disease, morbid obesity.  Patient of Dr. Darlean.  Previous [facility] pulmonary encounter:  01/14/2023 Patient presents today for 4 week follow-up.   -She has upper airway wheeze and productive cough with creamy yellow/color -She was seen on July 12th by Dr. Darlean and treated for COPD exacerbation with course of Augmentin  and prednisone  taper. She saw no improvement after taking medication.  -Using Breztri  Aerosphere two puffs twice daily -She used her nebulizer this morning without much improvement  -Wearing 2L 24/7  -She reports compliance with Protonix  and Pepcid   -She has been taking Mucinex -D  -She has a lot of swelling in legs, she takes lasix  20mg  daily and spirnolactone   -She has gained 5-7 lbs since last visit  -She has CPAP but has not been able to wear it for the last 6 months. Feels like she is suffocating. She has an incline bed.   [employer] download 06/12/2020-09/09/2020  06/12/2020 - 09/09/2020 Usage days 28/90 days (31%) Average usage days used 7 hours 16 minutes Pressure 5 to 15 cm H2O (14.4cm h20-95%) AHI 3.0    02/04/2023 She is doing a lot better since last OV in August, treatment for acute exacerbation. She feels back to baseline. She completed prednisone  course and states that Zyrtec -d really helped her  symptoms. She is in the process of getting a new CPAP machine  OSA on CPAP - Advised patient resume CPAP use once she gets new machine  - FU in 3 months for compliance check   COPD GOLD 2 - Improved - Continue Breztri  Aerosphere 2 puffs q 12 hours (use with spacer) - Continue Albuterol  nebulizer q4-6 hours for breakthrough shortness of breath/wheezing   Chronic respiratory failure with hypoxia (HCC) - Stable; Oxygen  dependent 2L 24/7   Chronic combined systolic and diastolic CHF (congestive heart failure) (HCC) - Continue lasix  20mg  daily and spironolactone  as directed  Upper airway cough syndrome -  Prontonix 40mg  BID and Pepcid  20mg  at bedtime   06/14/2023 Discussed the use of AI scribe software for clinical note transcription with the patient, who gave verbal consent to proceed.  Patient presents today for hospital follow-up. She was admitted in November from 04/03/23-04/12/23 for acute on chronic respiratory failure secondary to COPD exacerbation. Hx diastolic dysfunction and OSA on CPAP.  She required an increased oxygen  supply of four liters, up from her baseline of two liters. During her hospital stay, she was treated with nebulizers and a course of prednisone  and azithromycin . She was discharged once her oxygen  requirements returned to her baseline of two liters and she was deemed stable.  Since her discharge two months ago, the patient reports returning to her baseline health with no significant flare-ups or additional hospitalizations. She has been managing her COPD with a Breztri  inhaler twice daily and has rescue nebulizers and inhalers at home.  Regarding her heart failure, she is on aspirin , a  cholesterol medication Entresto , and a diuretic called spironolactone . She reports that her Lasix  was held during her hospital stay due to borderline blood pressure but was resumed upon discharge.  The patient also has severe sleep apnea, managed with a CPAP machine, which she admits  to using inconsistently. Her last sleep study was in 2016, showing about fifty-eight apneic events per hour. She has lost about fifteen pounds since her last sleep study.  Recently, the patient has noticed a change in her mucus color to a grayish-brown, which she believes is due to sinus drainage. She is currently managing this with Flonase  and Zyrtec  D. She also reports some acid reflux, which is being managed with medication.     07/16/2023- Interim  Discussed the use of AI scribe software for clinical note transcription with the patient, who gave verbal consent to proceed.  History of Present Illness   Brenda [name] is a 63 year old female with COPD who presents for a follow-up visit.  She was last seen in January and had been admitted to the hospital in November for a COPD flare-up. Since the last visit, she has not required hospitalization and feels at her baseline. She uses a Breztri  inhaler twice daily and a rescue inhaler less than once a day. She has not needed her nebulizer recently. Prednisone  was prescribed in January, which helped her symptoms.  She is on continuous oxygen  therapy at two liters per minute with no changes in her oxygen  requirements. She inquires about qualifying for portable oxygen , as she feels restricted by her current setup.  She has a history of sleep apnea and uses her CPAP machine every night since the last visit, which has improved her sleep quality. She confirms that her CPAP machine is functioning well.   Assessment and Plan:  1. COPD mixed type (HCC) (Primary)  2. OSA on CPAP  Chronic Obstructive Pulmonary Disease (COPD) Stable, no recent hospitalizations. Using Breztri  inhaler BID, rescue inhaler less than once daily, and no recent need for nebulizer. On 2 liters of oxygen . -Continue current regimen.  -Check on status of portable oxygen  concentrator order with [facility].  Sleep Apnea Using CPAP consistently and sleeping well. -Continue CPAP  use.  Gastroesophageal Reflux Disease (GERD) On daily reflux medication. -Continue current regimen.     12/15/2023- Acute exacerbation COPD COPD with acute exacerbation Experiencing an acute exacerbation characterized by increased dyspnea, wheezing, chest tightness, and occasional productive cough with yellow sputum. Symptoms have worsened over the past few months. Wheezing present on lung examination. Exacerbation likely due to bronchitis, as indicated by yellow sputum and congestion. - Administer Depo-medrol  80mg  shot today - Prescribe prednisone  taper to start tomorrow - Prescribe doxycycline  100mg  twice daily x 7 days - Order chest x-ray - Instruct to use Breztri  twice daily - Advise to use nebulizer two to three times daily as needed for wheezing or dyspnea - Recommend Mucinex  to loosen congestion - Advise to go to the emergency room if symptoms worsen   Obstructive sleep apnea Severe OSA. Patient reports compliance with use nightly. Current CPAP machine is outdated, download not available- Due for replacement CPAP machine - Order new CPAP through [facility] auto settings 5-20cm h20    Heart failure On Entresto , spironolactone , and Lasix  for management. Experiences occasional peripheral edema, managed by taking an extra dose of spironolactone  if edema persists for more than a couple of days. Weight has remained stable since January 2025. - Continue Entresto , spironolactone , and Lasix  as directed  01/21/2024 Discussed the use of AI scribe software for clinical note transcription with the patient, who gave verbal consent to proceed.  History of Present Illness Brenda [name] is a 63 year old female with COPD and severe sleep apnea who presents for follow-up after a recent COPD flare-up.  In July, she experienced a COPD flare-up with increased shortness of breath, wheezing, chest tightness, and an occasional productive cough. Treatment included a Depo-medrol  steroid injection, a  prednisone  taper, and doxycycline , which improved her symptoms. A chest x-ray at that time showed no active pulmonary disease. She returned to her baseline until the past week when she began experiencing sinus issues.  Currently, she has sinus and ear discomfort with clear nasal discharge. She has not been using Zyrtec  D. She uses Flonase  and Astelin  nasal sprays for her symptoms. She occasionally experiences chest tightness and uses her Albuterol  nebulizer as needed. Her oxygen  levels typically run at 95-96%, but she has had to increase her oxygen  to three liters at times due to feeling short of breath.  She has severe sleep apnea and uses her CPAP machine every night. She is awaiting a new CPAP machine as her current one is outdated. She is compliant with her heart failure medications, including Entresto , spironolactone , and Lasix .  Assessment & Plan Chronic obstructive pulmonary disease (COPD) with recent exacerbation Seen in July for COPD exacerbation with increased shortness of breath, wheezing, chest tightness, and occasional productive cough. Symptoms improved post-treatment with steroid injection, prednisone  taper, and doxycycline . Current symptoms include chest tightness and wheezing due to allergic rhinitis.  - Prescribe prednisone  taper for exacerbation symptoms. - Continue Breztri  inhaler twice daily as directed - Continue albuterol  nebulizer 2-3 times a day as needed for wheezing. - Monitor oxygen  levels at home and maintain oxygen  at 2 liters unless shortness of breath occurs.  Allergic rhinitis with acute sinus symptoms Acute sinus symptoms with clear nasal discharge, likely due to allergic rhinitis. Symptoms include sinus and ear discomfort. Avoidance of Zyrtec  D due to cardiac history. - Restart regular antihistamine (Claritin , Zyrtec , or Allegra) over the counter. - Use Flonase  nasal spray. - Use Astelin  nasal spray. - Consider chlorphenamine (Chlortabs) 4 mg every 4 hours as  needed if symptoms persist, with caution for drowsiness.  Obstructive sleep apnea, severe Severe obstructive sleep apnea managed with CPAP. Current machine is outdated and due for replacement. Awaiting new CPAP machine from [facility]. - Ensure regular use of CPAP every night 4-6 hours - Await contact from [facility] for new CPAP machine within the next week or two. - Consider bringing current machine to [facility] for download if experiencing issues or if pressure adjustments are needed.  Heart failure Heart failure managed with Entresto , spironolactone , and Lasix . Compliance with medication regimen confirmed. - Continue Entresto , spironolactone , and Lasix  as prescribed.  04/24/2024- Interim hx  Discussed the use of AI scribe software for clinical note transcription with the patient, who gave verbal consent to proceed.  History of Present Illness Brenda [name] is a 63 year old female with COPD, sleep apnea, allergic rhinitis, heart failure, and respiratory failure who presents for a three-month follow-up.  Since her last visit in August, she has been stable following treatment for a COPD exacerbation with a prednisone  taper. She has avoided hospitalization and uses her albuterol  rescue inhaler approximately once every other day, which provides relief. Her maintenance therapy includes Breztri  Aerosphere, two puffs twice daily.  For her respiratory failure, she is on continuous oxygen  therapy at  two liters per minute. She wears it consistently but can manage without it when sitting calmly. She reports her oxygen  saturation was 98% on two liters of oxygen .  She continues to experience symptoms of allergic rhinitis, including rhinorrhea. She uses Flonase  and [place]plin nasal sprays, along with over-the-counter Zyrtec , which she finds effective.  She uses a CPAP machine for sleep apnea, consistently wearing it every night for the past 30 days, averaging six hours and sixteen minutes of use per night. She  reports improved sleep quality with its use.  For heart failure, she is on Entresto , spironolactone , and Lasix , taken daily. She reports stable weight and no issues with fluid retention.  She received her flu shot this year and reports no need for medication refills at this time. She recently received a new CPAP machine.       Observations/Objective:  Appears well without overt respiratory symptoms  Assessment and Plan:  Assessment and Plan Assessment & Plan Chronic obstructive pulmonary disease (COPD) COPD is well-managed with no recent hospitalizations or exacerbations. Last flare up was in August which was treated with prednisone  and patient reports being back to baseline She uses albuterol  as needed, approximately once every other day, which is effective. - Continue Breztri  Aerosphere, two puffs twice a day - Continue albuterol  as needed every 6 hours as needed for breakthrough symptoms   Chronic respiratory failure with hypoxia Managed with continuous oxygen  therapy. Oxygen  saturation is 98% on two liters of oxygen . She uses oxygen  consistently, except when sitting calmly. - Continue two liters of oxygen  24/7  Obstructive sleep apnea Well-controlled with CPAP therapy. She uses CPAP consistently, averaging six hours and sixteen minutes per night. Current pressure 5-20cm h20; residual apnea score is 2.5, indicating effective management. She reports feeling the benefit of CPAP therapy. - Continue CPAP therapy with current settings  Allergic rhinitis Persists with postnasal drip. She uses Flonase  and Asplin nasal sprays, and Zyrtec , which is effective. Chlortabs are available as an additional option for symptom control. - Continue Flonase  and Asplin nasal sprays - Continue Zyrtec  as needed - Consider Chlortabs as needed for additional symptom control  Heart failure Well-managed with stable weight and fluid status. She takes Entresto , spironolactone , and Lasix  daily. - Continue  Entresto , spironolactone , and Lasix     Follow Up Instructions:   6 months with Landry NP  I discussed the assessment and treatment plan with the patient. The patient was provided an opportunity to ask questions and all were answered. The patient agreed with the plan and demonstrated an understanding of the instructions.   The patient was advised to call back or seek an in-person evaluation if the symptoms worsen or if the condition fails to improve as anticipated.  I provided 30 minutes of non-face-to-face time during this encounter.   Almarie LELON Ferrari, NP

## 2024-04-25 NOTE — Progress Notes (Signed)
 Remote Loop Recorder Transmission

## 2024-05-01 ENCOUNTER — Ambulatory Visit: Admitting: Family Medicine

## 2024-05-01 ENCOUNTER — Encounter: Payer: Self-pay | Admitting: Family Medicine

## 2024-05-01 VITALS — BP 130/78 | HR 77 | Temp 98.1°F | Ht 59.84 in | Wt 273.2 lb

## 2024-05-01 DIAGNOSIS — J449 Chronic obstructive pulmonary disease, unspecified: Secondary | ICD-10-CM

## 2024-05-01 DIAGNOSIS — Z7985 Long-term (current) use of injectable non-insulin antidiabetic drugs: Secondary | ICD-10-CM

## 2024-05-01 DIAGNOSIS — E1169 Type 2 diabetes mellitus with other specified complication: Secondary | ICD-10-CM | POA: Diagnosis not present

## 2024-05-01 DIAGNOSIS — G4733 Obstructive sleep apnea (adult) (pediatric): Secondary | ICD-10-CM | POA: Diagnosis not present

## 2024-05-01 LAB — POCT GLYCOSYLATED HEMOGLOBIN (HGB A1C): Hemoglobin A1C: 6.6 % — AB (ref 4.0–5.6)

## 2024-05-01 MED ORDER — LANCETS MISC: 1.0000 | 0 refills | Status: AC

## 2024-05-01 MED ORDER — LANCET DEVICE MISC: 1.0000 | 0 refills | Status: AC

## 2024-05-01 MED ORDER — BLOOD GLUCOSE MONITORING SUPPL DEVI: 1.0000 | 0 refills | Status: AC

## 2024-05-01 MED ORDER — BLOOD GLUCOSE TEST VI STRP: 1.0000 | ORAL_STRIP | 0 refills | Status: AC

## 2024-05-01 NOTE — Assessment & Plan Note (Addendum)
 Chronic, stable on current regimen.  Jaridance through cardiology. Reviewed recent UTI - she will monitor for recurrent UTIs.  Tolerating Trulicity  1.5 mg weekly dose - notes remote h/o pancreatitis, but currently without symptoms. Will need to carefully monitor for recurrent symptoms of pancreatitis.

## 2024-05-01 NOTE — Assessment & Plan Note (Signed)
Continue CPAP, followed by pulm.  ?

## 2024-05-01 NOTE — Progress Notes (Signed)
 Ph: [phone] Fax: [phone]   Patient ID: Brenda [name], female    DOB: 05-08-1961, 63 y.o.   MRN: [id number]  This visit was conducted in person.  BP 130/78   Pulse 77   Temp 98.1 F (36.7 C)   Ht 4' 11.84 (1.52 m)   Wt 273 lb 3.2 oz (123.9 kg)   LMP 03/08/2013   SpO2 95%   PF (!) 2 L/min   BMI 53.64 kg/m    CC: 6 mo DM f/u visit Subjective:   HPI: Brenda [name] is a 63 y.o. female presenting on 05/01/2024 for Medical Management of Chronic Issues (Diabetes follow up)   On medicare since 2021.   COPD, chronic resp failure on continuous supplemnetal oxygen , OSA on CPAP followed by pulm, last seen 04/24/2024.   DM - does not regularly check sugars - last checked 2wks ago, fasting 127. Compliant with antihyperglycemic regimen which includes: trulicity  1.5mg  weekly, jardiance  10mg  daily. Denies low sugars or hypoglycemic symptoms. Denies paresthesias, blurry vision. Last diabetic eye exam 07/2023. Glucometer brand: Relion --> needs new Rx. Last foot exam: 04/2023 - rpt today. DSME: offered, declines. Lab Results  Component Value Date   HGBA1C 6.6 (A) 05/01/2024   Diabetic Foot Exam - Simple   Simple Foot Form Diabetic Foot exam was performed with the following findings: Yes 05/01/2024  3:15 PM  Visual Inspection No deformities, no ulcerations, no other skin breakdown bilaterally: Yes Sensation Testing Intact to touch and monofilament testing bilaterally: Yes Pulse Check Posterior Tibialis and Dorsalis pulse intact bilaterally: Yes Comments No claudication  Calluses to medial great toes bilaterally    Lab Results  Component Value Date   MICROALBUR 0.9 10/22/2023    She did have UTI 03/13/2024 treated with keflex  500mg  bid 1 wk course with full symptom resolution.  UCx grew >100k pansensitive E coli     Relevant past medical, surgical, family and social history reviewed and updated as indicated. Interim medical history since our last visit  reviewed. Allergies and medications reviewed and updated. Outpatient Medications Prior to Visit  Medication Sig Dispense Refill   albuterol  (PROVENTIL ) (2.5 MG/3ML) 0.083% nebulizer solution INHALE 1 VIAL VIA NEBULIZER EVERY 6 HOURS AS NEEDED FOR WHEEZING/SHORTNESS OF BREATH 150 mL 1   albuterol  (VENTOLIN  HFA) 108 (90 Base) MCG/ACT inhaler TAKE 2 PUFFS BY MOUTH EVERY 6 HOURS AS NEEDED FOR WHEEZE OR SHORTNESS OF BREATH 8.5 each 3   ARIPiprazole  (ABILIFY ) 10 MG tablet Take 1 tablet (10 mg total) by mouth daily. 90 tablet 4   aspirin  EC 81 MG EC tablet Take 1 tablet (81 mg total) by mouth daily. Swallow whole. 30 tablet 0   atorvastatin  (LIPITOR) 80 MG tablet TAKE 1 TABLET BY MOUTH EVERYDAY AT BEDTIME For cholesterol 90 tablet 4   Azelastine  HCl 137 MCG/SPRAY SOLN PLACE 2 SPRAYS INTO BOTH NOSTRILS 2 (TWO) TIMES DAILY. USE IN EACH NOSTRIL AS DIRECTED 90 mL 1   BREZTRI  AEROSPHERE 160-9-4.8 MCG/ACT AERO inhaler INHALE 2 PUFFS INTO LUNGS 2 TIMES DAILY 10.7 each 11   cetirizine -pseudoephedrine (ZYRTEC -D) 5-120 MG tablet Take 1 tablet by mouth every evening.     Cholecalciferol  (VITAMIN D3) 25 MCG (1000 UT) CAPS Take 1 capsule (1,000 Units total) by mouth daily. 30 capsule    cyanocobalamin  (VITAMIN B12) 1000 MCG tablet Take 1 tablet (1,000 mcg total) by mouth daily.     dextromethorphan -guaiFENesin  (MUCINEX  DM) 30-600 MG 12hr tablet Take 1 tablet by mouth 2 (two) times daily as  needed for cough.     Dulaglutide  (TRULICITY ) 1.5 MG/0.5ML SOAJ Inject 1.5 mg into the skin once a week. 6 mL 3   empagliflozin  (JARDIANCE ) 10 MG TABS tablet Take 1 tablet (10 mg total) by mouth daily before breakfast. 15 tablet 0   ENTRESTO  24-26 MG Take 1 tablet by mouth 2 (two) times daily. 180 tablet 0   escitalopram  (LEXAPRO ) 20 MG tablet Take 1 tablet (20 mg total) by mouth daily. 90 tablet 4   famotidine  (PEPCID ) 20 MG tablet TAKE 1 TABLET EVERY DAY AFTER SUPPER 90 tablet 3   fluticasone  (FLONASE ) 50 MCG/ACT nasal spray  Place 2 sprays into both nostrils daily. 48 mL 3   furosemide  (LASIX ) 20 MG tablet Take 1 tablet (20 mg total) by mouth daily. 90 tablet 4   gabapentin  (NEURONTIN ) 300 MG capsule Take 2 capsules (600 mg total) by mouth in the morning AND 3 capsules (900 mg total) at bedtime. 450 capsule 4   LORazepam  (ATIVAN ) 0.5 MG tablet TAKE 0.5-1 TABLET (0.25-0.5 MG TOTAL) BY MOUTH AT BEDTIME AS NEEDED FOR ANXIETY. 30 tablet 0   metoprolol  succinate (TOPROL -XL) 25 MG 24 hr tablet Take 1 tablet (25 mg total) by mouth daily. 15 tablet 0   Multiple Vitamin (MULTIVITAMIN WITH MINERALS) TABS tablet Take 1 tablet by mouth daily.     nitroGLYCERIN  (NITROSTAT ) 0.4 MG SL tablet Place 1 tablet (0.4 mg total) under the tongue every 5 (five) minutes as needed. 25 tablet 2   OXYGEN  Inhale 2 L/min into the lungs continuous.     pantoprazole  (PROTONIX ) 40 MG tablet TAKE 1 TABLET BY MOUTH 2 TIMES DAILY 30-60 MIN BEFORE FIRST MEAL OF THE DAY 180 tablet 2   spironolactone  (ALDACTONE ) 25 MG tablet Take 0.5 tablets (12.5 mg total) by mouth daily. 7 tablet 0   traMADol  (ULTRAM ) 50 MG tablet TAKE 1 TABLET BY MOUTH EVERY 12 HOURS AS NEEDED 20 tablet 0   cephALEXin  (KEFLEX ) 500 MG capsule Take 1 capsule (500 mg total) by mouth 2 (two) times daily. 14 capsule 0   No facility-administered medications prior to visit.     Per HPI unless specifically indicated in ROS section below Review of Systems  Objective:  BP 130/78   Pulse 77   Temp 98.1 F (36.7 C)   Ht 4' 11.84 (1.52 m)   Wt 273 lb 3.2 oz (123.9 kg)   LMP 03/08/2013   SpO2 95%   PF (!) 2 L/min   BMI 53.64 kg/m   Wt Readings from Last 3 Encounters:  05/01/24 273 lb 3.2 oz (123.9 kg)  04/24/24 267 lb (121.1 kg)  03/13/24 274 lb (124.3 kg)      Physical Exam Vitals and nursing note reviewed.  Constitutional:      Appearance: Normal appearance. She is not ill-appearing.     Comments: Supplemental oxygen  in place   HENT:     Head: Normocephalic and atraumatic.      Mouth/Throat:     Mouth: Mucous membranes are moist.     Pharynx: Oropharynx is clear. No oropharyngeal exudate or posterior oropharyngeal erythema.  Eyes:     Extraocular Movements: Extraocular movements intact.     Conjunctiva/sclera: Conjunctivae normal.     Pupils: Pupils are equal, round, and reactive to light.  Cardiovascular:     Rate and Rhythm: Normal rate and regular rhythm.     Pulses: Normal pulses.     Heart sounds: Normal heart sounds. No murmur heard. Pulmonary:  Effort: Pulmonary effort is normal. No respiratory distress.     Breath sounds: Normal breath sounds. No wheezing, rhonchi or rales.  Musculoskeletal:     Right lower leg: No edema.     Left lower leg: No edema.  Skin:    General: Skin is warm and dry.     Findings: No rash.  Neurological:     Mental Status: She is alert.  Psychiatric:        Mood and Affect: Mood normal.        Behavior: Behavior normal.       Results for orders placed or performed in visit on 05/01/24  POCT glycosylated hemoglobin (Hb A1C)   Collection Time: 05/01/24  3:05 PM  Result Value Ref Range   Hemoglobin A1C 6.6 (A) 4.0 - 5.6 %   HbA1c POC (<> result, manual entry)     HbA1c, POC (prediabetic range)     HbA1c, POC (controlled diabetic range)      Assessment & Plan:   Problem List Items Addressed This Visit     OSA on CPAP   Continue CPAP, followed by pulm.       Type 2 diabetes mellitus with other specified complication (HCC) - Primary   Chronic, stable on current regimen.  Jaridance through cardiology. Reviewed recent UTI - she will monitor for recurrent UTIs.  Tolerating Trulicity  1.5 mg weekly dose - notes remote h/o pancreatitis, but currently without symptoms. Will need to carefully monitor for recurrent symptoms of pancreatitis.       Relevant Orders   POCT glycosylated hemoglobin (Hb A1C) (Completed)   COPD (chronic obstructive pulmonary disease) (HCC)   Chronic, stable period on current regimen -  appreciate pulm care.         Meds ordered this encounter  Medications   Blood Glucose Monitoring Suppl DEVI    Sig: 1 each by Does not apply route as directed. Dispense based on patient and insurance preference. Use up to four times daily as directed. (FOR ICD-10 E11.69)    Dispense:  1 each    Refill:  0   Glucose Blood (BLOOD GLUCOSE TEST STRIPS) STRP    Sig: 1 each by Does not apply route as directed. Dispense based on patient and insurance preference. Use up to four times daily as directed. (FOR ICD-10 E10.9, E11.9).    Dispense:  100 strip    Refill:  0   Lancet Device MISC    Sig: 1 each by Does not apply route as directed. Dispense based on patient and insurance preference. Use up to four times daily as directed. (FOR ICD-10 E10.9, E11.9).    Dispense:  1 each    Refill:  0   Lancets MISC    Sig: 1 each by Does not apply route as directed. Dispense based on patient and insurance preference. Use up to four times daily as directed. (FOR ICD-10 E10.9, E11.9).    Dispense:  100 each    Refill:  0    Orders Placed This Encounter  Procedures   POCT glycosylated hemoglobin (Hb A1C)    Patient Instructions  New glucometer prescription sent to pharmacy. Let us  know if you change your mind on diabetes classes Good to see you today Continue current medicines Return as needed or in 6 months for physical   Follow up plan: Return in about 6 months (around 10/30/2024) for annual exam, prior fasting for blood work, medicare wellness visit.  Anton Blas, MD

## 2024-05-01 NOTE — Assessment & Plan Note (Signed)
 Chronic, stable period on current regimen - appreciate pulm care.

## 2024-05-01 NOTE — Patient Instructions (Addendum)
 New glucometer prescription sent to pharmacy. Let us  know if you change your mind on diabetes classes Good to see you today Continue current medicines Return as needed or in 6 months for physical

## 2024-05-03 ENCOUNTER — Other Ambulatory Visit: Payer: Self-pay | Admitting: Cardiovascular Disease

## 2024-05-03 ENCOUNTER — Ambulatory Visit: Payer: Self-pay | Admitting: Internal Medicine

## 2024-05-04 ENCOUNTER — Other Ambulatory Visit: Payer: Self-pay | Admitting: Family Medicine

## 2024-05-04 DIAGNOSIS — G894 Chronic pain syndrome: Secondary | ICD-10-CM

## 2024-05-04 DIAGNOSIS — G8929 Other chronic pain: Secondary | ICD-10-CM

## 2024-05-05 NOTE — Telephone Encounter (Signed)
 Last OV: 05/01/24 Pending OV: 11/02/23 Medication: Tramadol  50mg  Directions: Take one tablet q12h prn Last Refill: 04/13/24 Qty: #20 with 0 refills

## 2024-05-08 NOTE — Telephone Encounter (Signed)
 ERx

## 2024-05-16 ENCOUNTER — Other Ambulatory Visit: Payer: Self-pay | Admitting: Family Medicine

## 2024-05-16 DIAGNOSIS — F319 Bipolar disorder, unspecified: Secondary | ICD-10-CM

## 2024-05-16 NOTE — Telephone Encounter (Signed)
 Requesting: Ativan   Contract: No UDS:  Last Visit: 05/04/2024 Next Visit: 11/01/2024 Last Refill: 04/13/24  Please Advise

## 2024-05-17 NOTE — Telephone Encounter (Signed)
 ERx

## 2024-05-18 ENCOUNTER — Encounter

## 2024-05-23 ENCOUNTER — Ambulatory Visit

## 2024-05-23 DIAGNOSIS — G459 Transient cerebral ischemic attack, unspecified: Secondary | ICD-10-CM

## 2024-05-24 LAB — CUP PACEART REMOTE DEVICE CHECK
Date Time Interrogation Session: 20251222230043
Implantable Pulse Generator Implant Date: 20211102

## 2024-05-25 ENCOUNTER — Encounter

## 2024-05-26 NOTE — Progress Notes (Signed)
 Remote Loop Recorder Transmission

## 2024-05-28 ENCOUNTER — Ambulatory Visit: Payer: Self-pay | Admitting: Internal Medicine

## 2024-06-02 ENCOUNTER — Other Ambulatory Visit: Payer: Self-pay | Admitting: Family Medicine

## 2024-06-02 DIAGNOSIS — J449 Chronic obstructive pulmonary disease, unspecified: Secondary | ICD-10-CM

## 2024-06-15 ENCOUNTER — Other Ambulatory Visit: Payer: Self-pay | Admitting: Cardiovascular Disease

## 2024-06-15 ENCOUNTER — Other Ambulatory Visit: Payer: Self-pay | Admitting: Family Medicine

## 2024-06-15 DIAGNOSIS — F319 Bipolar disorder, unspecified: Secondary | ICD-10-CM

## 2024-06-15 DIAGNOSIS — G894 Chronic pain syndrome: Secondary | ICD-10-CM

## 2024-06-15 DIAGNOSIS — G8929 Other chronic pain: Secondary | ICD-10-CM

## 2024-06-16 NOTE — Telephone Encounter (Signed)
 Requesting: Ativan   Contract: No UDS:  Last Visit: 05/01/2024 Next Visit: 11/01/2024 Last Refill: 05/17/24   Requesting: Tramadol   Contract: No UDS:  Last Visit: 05/01/2024 Next Visit: 11/01/2024 Last Refill: [id number]  Please Advise

## 2024-06-17 NOTE — Telephone Encounter (Signed)
 ERx

## 2024-06-19 ENCOUNTER — Encounter

## 2024-06-19 NOTE — Telephone Encounter (Signed)
 Labs are outside of Normal  In accordance with refill protocols, please review and address the following requirements before this medication refill can be authorized:  Labs

## 2024-06-19 NOTE — Progress Notes (Unsigned)
 " Cardiology Office Note:  .   Date:  06/23/2024  ID:  [name], DOB 1960-10-14, MRN [id number] PCP: Rilla Baller, MD  [facility] Providers Cardiologist:  Darryle ONEIDA Decent, MD   History of Present Illness: .    Chief Complaint  Patient presents with   Shortness of Breath         Brenda [name] is a 64 y.o. female with below history who presents for follow-up.   History of Present Illness   Brenda [name] is a 64 year old female with systolic heart failure and COPD who presents for a follow-up visit. She is accompanied by her daughter.  She has a history of systolic heart failure with a recovered ejection fraction, previously affected by Takotsubo cardiomyopathy and an inferior STEMI, post PCI to the distal RCA in 2022. Her ejection fraction has improved to 50-55%. No chest pain, significant swelling, or fluid retention. Her current medications for heart failure include Entresto  24/26 mg BID, Jardiance  10 mg daily, metoprolol  succinate 25 mg daily, spironolactone  12.5 mg daily, and furosemide  20 mg daily.  She has COPD and feels slightly short of breath today. She uses a rescue inhaler and nebulizer as needed. She is on 2 liters of oxygen  and finds it challenging to engage in physical activity due to her condition.  Her diabetes is well controlled with an A1c of 6.6. She takes Jardiance  10 mg daily for diabetes management.  She has a history of hyperlipidemia and hypertension. Her cholesterol levels were last checked in May. She takes Lipitor 80 mg and aspirin  as part of her regimen.  She experienced a stroke in 2021, but there has been no documented atrial fibrillation. She has had a cardiac monitor for several years, which has not shown any episodes of atrial fibrillation.           Problem List 1. Systolic HF -EF 30-35% with apical akinesis 01/31/2020 in setting of COPD/hypercarbic respiratory failure -> Takotsubo CM -EF 40-45% with improvement in WMA  02/08/2020 -EF 55-60% 06/28/2020 -EF 45-50% 11/02/2020 (Inferior STEMI) -EF 60-65% 05/09/2021 -EF 50-55% 02/25/2023 3. STEMI 11/02/2020 -PCI to dRCA 4. COPD -severe COPD -2L 5. Tobacco abuse 6. HTN 7. DM -A1c 6.6 8. HLD -T chol 122, HDL 44, LDL 49, TG 141 9. CVA 01/2020/Cryptogenic -basilar artery stenosis? -TEE negative -loop recorder placed 10. SVT    ROS: All other ROS reviewed and negative. Pertinent positives noted in the HPI.     Studies Reviewed: Brenda [name]   EKG Interpretation Date/Time:  Friday June 23 2024 15:07:41 EST Ventricular Rate:  66 PR Interval:  130 QRS Duration:  84 QT Interval:  424 QTC Calculation: 444 R Axis:   25  Text Interpretation: Normal sinus rhythm Low voltage QRS Septal infarct , age undetermined Confirmed by Decent Darryle [phone]) on 06/23/2024 3:10:46 PM   TTE 02/25/2023  1. Left ventricular ejection fraction, by estimation, is 50 to 55%. The  left ventricle has low normal function. The left ventricle demonstrates  global hypokinesis. Left ventricular diastolic parameters are  indeterminate.   2. Right ventricular systolic function is moderately reduced. The right  ventricular size is moderately enlarged.   3. The mitral valve is normal in structure. No evidence of mitral valve  regurgitation. No evidence of mitral stenosis.   4. The aortic valve is normal in structure. Aortic valve regurgitation is  not visualized. No aortic stenosis is present.   5. The inferior vena cava is normal  in size with greater than 50%  respiratory variability, suggesting right atrial pressure of 3 mmHg.  Physical Exam:   VS:  BP 114/63 (BP Location: Left Arm, Patient Position: Sitting, Cuff Size: Large)   Pulse 66   Ht 5' (1.524 m)   Wt 273 lb (123.8 kg)   LMP 03/08/2013   SpO2 97%   BMI 53.32 kg/m    Wt Readings from Last 3 Encounters:  06/23/24 273 lb (123.8 kg)  05/01/24 273 lb 3.2 oz (123.9 kg)  04/24/24 267 lb (121.1 kg)    GEN: Well nourished, well  developed in no acute distress NECK: No JVD; No carotid bruits CARDIAC: RRR, no murmurs, rubs, gallops RESPIRATORY:  +wheezing ABDOMEN: Soft, non-tender, non-distended EXTREMITIES:  No edema; No deformity  ASSESSMENT AND PLAN: .   Assessment and Plan    Coronary artery disease without angina Coronary artery disease is well-managed without angina. LDL cholesterol is well-controlled at 49 mg/dL. - Continue Lipitor 80 mg daily. - Continue aspirin  81 mg indefinitely.  Chronic systolic heart failure with recovered ejection fraction, 50-55% Chronic systolic heart failure with recovered ejection fraction. Most recent ejection fraction is 50-55%. Evidence of right ventricular dysfunction likely related to COPD. - Continue Entresto  24/26 mg BID. - Continue Jardiance  10 mg daily. - Continue metoprolol  succinate 25 mg daily. - Continue spironolactone  12.5 mg daily. - Continue furosemide  20 mg daily.  Chronic obstructive pulmonary disease Mild wheezing and shortness of breath reported. No significant fluid retention. - Use rescue inhaler as needed. - Use nebulizer if symptoms worsen. - Contact primary care if symptoms worsen for potential steroid treatment.  Mixed hyperlipidemia Well-controlled with current medication regimen. Last cholesterol check in May showed excellent results. - Continue Lipitor 80 mg daily. - Ensure annual cholesterol check and send results to cardiologist.  Primary hypertension Blood pressure is well-controlled with current medication regimen.             Follow-up: Return in about 1 year (around 06/23/2025).  Signed, Darryle DASEN. Barbaraann, MD, [facility]  [facility]  [address] [place], [place] [address] [phone]  3:24 PM   "

## 2024-06-23 ENCOUNTER — Ambulatory Visit: Payer: Self-pay | Attending: Cardiovascular Disease | Admitting: Cardiovascular Disease

## 2024-06-23 ENCOUNTER — Ambulatory Visit

## 2024-06-23 ENCOUNTER — Ambulatory Visit: Payer: Self-pay | Admitting: Cardiology

## 2024-06-23 ENCOUNTER — Encounter: Payer: Self-pay | Admitting: Cardiovascular Disease

## 2024-06-23 VITALS — BP 114/63 | HR 66 | Ht 60.0 in | Wt 273.0 lb

## 2024-06-23 DIAGNOSIS — I2510 Atherosclerotic heart disease of native coronary artery without angina pectoris: Secondary | ICD-10-CM | POA: Diagnosis not present

## 2024-06-23 DIAGNOSIS — G459 Transient cerebral ischemic attack, unspecified: Secondary | ICD-10-CM | POA: Diagnosis not present

## 2024-06-23 DIAGNOSIS — E782 Mixed hyperlipidemia: Secondary | ICD-10-CM | POA: Diagnosis not present

## 2024-06-23 DIAGNOSIS — I10 Essential (primary) hypertension: Secondary | ICD-10-CM

## 2024-06-23 DIAGNOSIS — I5022 Chronic systolic (congestive) heart failure: Secondary | ICD-10-CM | POA: Diagnosis not present

## 2024-06-23 DIAGNOSIS — I2781 Cor pulmonale (chronic): Secondary | ICD-10-CM | POA: Diagnosis not present

## 2024-06-23 LAB — CUP PACEART REMOTE DEVICE CHECK
Date Time Interrogation Session: 20260122230036
Implantable Pulse Generator Implant Date: 20211102

## 2024-06-23 MED ORDER — METOPROLOL SUCCINATE ER 25 MG PO TB24: 25.0000 mg | ORAL_TABLET | Freq: Every day | ORAL | 3 refills | Status: AC

## 2024-06-23 MED ORDER — EMPAGLIFLOZIN 10 MG PO TABS: 10.0000 mg | ORAL_TABLET | Freq: Every day | ORAL | 3 refills | Status: AC

## 2024-06-23 MED ORDER — SPIRONOLACTONE 25 MG PO TABS: 12.5000 mg | ORAL_TABLET | Freq: Every day | ORAL | 3 refills | Status: AC

## 2024-06-23 MED ORDER — ENTRESTO 24-26 MG PO TABS: 1.0000 | ORAL_TABLET | Freq: Two times a day (BID) | ORAL | 3 refills | Status: AC

## 2024-06-23 NOTE — Patient Instructions (Signed)
 Medication Instructions:  Your physician recommends that you continue on your current medications as directed. Please refer to the Current Medication list given to you today.  *If you need a refill on your cardiac medications before your next appointment, please call your pharmacy*   Follow-Up: At [facility], you and your health needs are our priority.  As part of our continuing mission to provide you with exceptional heart care, our providers are all part of one team.  This team includes your primary Cardiologist (physician) and Advanced Practice Providers or APPs (Physician Assistants and Nurse Practitioners) who all work together to provide you with the care you need, when you need it.  Your next appointment:   12 month(s)  Provider:   Darryle ONEIDA Decent, MD or One of our Advanced Practice Providers (APPs): Morse Clause, PA-C  Lamarr Satterfield, NP Miriam Shams, NP  Olivia Pavy, PA-C Josefa Beauvais, NP  Leontine Salen, PA-C Orren Fabry, PA-C  [place], PA-C Ernest Dick, NP  Damien Braver, NP Jon Hails, PA-C  Waddell Donath, PA-C    Dayna Dunn, PA-C  Scott Weaver, PA-C Lum Louis, NP Katlyn West, NP Callie Goodrich, PA-C  Xika Zhao, NP Sheng Haley, PA-C    Kathleen Johnson, PA-C    We recommend signing up for the patient portal called MyChart.  Sign up information is provided on this After Visit Summary.  MyChart is used to connect with patients for Virtual Visits (Telemedicine).  Patients are able to view lab/test results, encounter notes, upcoming appointments, etc.  Non-urgent messages can be sent to your provider as well.   To learn more about what you can do with MyChart, go to [email or web address].   Other Instructions

## 2024-06-26 ENCOUNTER — Encounter

## 2024-06-26 NOTE — Progress Notes (Signed)
 Remote Loop Recorder Transmission

## 2024-06-27 ENCOUNTER — Other Ambulatory Visit: Payer: Self-pay | Admitting: Primary Care

## 2024-07-20 ENCOUNTER — Encounter

## 2024-07-24 ENCOUNTER — Ambulatory Visit

## 2024-07-27 ENCOUNTER — Encounter

## 2024-08-21 ENCOUNTER — Encounter

## 2024-08-24 ENCOUNTER — Ambulatory Visit

## 2024-09-24 ENCOUNTER — Ambulatory Visit

## 2024-10-24 ENCOUNTER — Ambulatory Visit: Admitting: Primary Care

## 2024-10-25 ENCOUNTER — Other Ambulatory Visit

## 2024-10-25 ENCOUNTER — Ambulatory Visit

## 2024-10-26 ENCOUNTER — Ambulatory Visit

## 2024-10-31 ENCOUNTER — Ambulatory Visit

## 2024-11-01 ENCOUNTER — Encounter: Admitting: Family Medicine

## 2024-11-25 ENCOUNTER — Ambulatory Visit

## 2024-12-26 ENCOUNTER — Ambulatory Visit

## 2025-01-26 ENCOUNTER — Ambulatory Visit

## 2025-02-26 ENCOUNTER — Ambulatory Visit

## 2025-03-29 ENCOUNTER — Ambulatory Visit

## 2025-04-29 ENCOUNTER — Ambulatory Visit

## 2025-05-30 ENCOUNTER — Ambulatory Visit
